# Patient Record
Sex: Female | Born: 1945 | Race: White | Hispanic: No | Smoking: Never smoker
Health system: Southern US, Community
[De-identification: ages and names within clinical notes are randomized; demographics above are authoritative.]

## PROBLEM LIST (undated history)

## (undated) DIAGNOSIS — M1990 Unspecified osteoarthritis, unspecified site: Secondary | ICD-10-CM

## (undated) DIAGNOSIS — T4145XA Adverse effect of unspecified anesthetic, initial encounter: Secondary | ICD-10-CM

## (undated) DIAGNOSIS — M329 Systemic lupus erythematosus, unspecified: Secondary | ICD-10-CM

## (undated) DIAGNOSIS — H269 Unspecified cataract: Secondary | ICD-10-CM

## (undated) DIAGNOSIS — F329 Major depressive disorder, single episode, unspecified: Secondary | ICD-10-CM

## (undated) DIAGNOSIS — I2510 Atherosclerotic heart disease of native coronary artery without angina pectoris: Secondary | ICD-10-CM

## (undated) DIAGNOSIS — Z9889 Other specified postprocedural states: Secondary | ICD-10-CM

## (undated) DIAGNOSIS — E538 Deficiency of other specified B group vitamins: Secondary | ICD-10-CM

## (undated) DIAGNOSIS — F32A Depression, unspecified: Secondary | ICD-10-CM

## (undated) DIAGNOSIS — T8859XA Other complications of anesthesia, initial encounter: Secondary | ICD-10-CM

## (undated) DIAGNOSIS — N3010 Interstitial cystitis (chronic) without hematuria: Secondary | ICD-10-CM

## (undated) DIAGNOSIS — T7840XA Allergy, unspecified, initial encounter: Secondary | ICD-10-CM

## (undated) DIAGNOSIS — G9050 Complex regional pain syndrome I, unspecified: Secondary | ICD-10-CM

## (undated) DIAGNOSIS — IMO0001 Reserved for inherently not codable concepts without codable children: Secondary | ICD-10-CM

## (undated) DIAGNOSIS — M797 Fibromyalgia: Secondary | ICD-10-CM

## (undated) DIAGNOSIS — Z8679 Personal history of other diseases of the circulatory system: Secondary | ICD-10-CM

## (undated) DIAGNOSIS — Z5189 Encounter for other specified aftercare: Secondary | ICD-10-CM

## (undated) DIAGNOSIS — F419 Anxiety disorder, unspecified: Secondary | ICD-10-CM

## (undated) DIAGNOSIS — Z86010 Personal history of colonic polyps: Secondary | ICD-10-CM

## (undated) DIAGNOSIS — M810 Age-related osteoporosis without current pathological fracture: Secondary | ICD-10-CM

## (undated) DIAGNOSIS — D649 Anemia, unspecified: Secondary | ICD-10-CM

## (undated) DIAGNOSIS — I509 Heart failure, unspecified: Secondary | ICD-10-CM

## (undated) DIAGNOSIS — I5181 Takotsubo syndrome: Secondary | ICD-10-CM

## (undated) DIAGNOSIS — G20 Parkinson's disease: Secondary | ICD-10-CM

## (undated) DIAGNOSIS — R519 Headache, unspecified: Secondary | ICD-10-CM

## (undated) DIAGNOSIS — H409 Unspecified glaucoma: Secondary | ICD-10-CM

## (undated) DIAGNOSIS — I951 Orthostatic hypotension: Secondary | ICD-10-CM

## (undated) DIAGNOSIS — M351 Other overlap syndromes: Secondary | ICD-10-CM

## (undated) DIAGNOSIS — R112 Nausea with vomiting, unspecified: Secondary | ICD-10-CM

## (undated) DIAGNOSIS — E785 Hyperlipidemia, unspecified: Secondary | ICD-10-CM

## (undated) DIAGNOSIS — K219 Gastro-esophageal reflux disease without esophagitis: Secondary | ICD-10-CM

## (undated) DIAGNOSIS — I429 Cardiomyopathy, unspecified: Secondary | ICD-10-CM

## (undated) DIAGNOSIS — J189 Pneumonia, unspecified organism: Secondary | ICD-10-CM

## (undated) HISTORY — DX: Complex regional pain syndrome I, unspecified: G90.50

## (undated) HISTORY — DX: Unspecified cataract: H26.9

## (undated) HISTORY — DX: Systemic lupus erythematosus, unspecified: M32.9

## (undated) HISTORY — DX: Hyperlipidemia, unspecified: E78.5

## (undated) HISTORY — DX: Parkinson's disease: G20

## (undated) HISTORY — DX: Unspecified osteoarthritis, unspecified site: M19.90

## (undated) HISTORY — DX: Heart failure, unspecified: I50.9

## (undated) HISTORY — DX: Age-related osteoporosis without current pathological fracture: M81.0

## (undated) HISTORY — DX: Personal history of other diseases of the circulatory system: Z86.79

## (undated) HISTORY — DX: Cardiomyopathy, unspecified: I42.9

## (undated) HISTORY — DX: Deficiency of other specified B group vitamins: E53.8

## (undated) HISTORY — DX: Takotsubo syndrome: I51.81

## (undated) HISTORY — DX: Anxiety disorder, unspecified: F41.9

## (undated) HISTORY — DX: Encounter for other specified aftercare: Z51.89

## (undated) HISTORY — PX: EYE SURGERY: SHX253

## (undated) HISTORY — PX: ABDOMINAL HYSTERECTOMY: SHX81

## (undated) HISTORY — DX: Atherosclerotic heart disease of native coronary artery without angina pectoris: I25.10

## (undated) HISTORY — DX: Other overlap syndromes: M35.1

## (undated) HISTORY — DX: Major depressive disorder, single episode, unspecified: F32.9

## (undated) HISTORY — PX: CHOLECYSTECTOMY: SHX55

## (undated) HISTORY — DX: Depression, unspecified: F32.A

## (undated) HISTORY — PX: UPPER GASTROINTESTINAL ENDOSCOPY: SHX188

## (undated) HISTORY — DX: Reserved for inherently not codable concepts without codable children: IMO0001

## (undated) HISTORY — PX: CATARACT EXTRACTION: SUR2

## (undated) HISTORY — DX: Orthostatic hypotension: I95.1

## (undated) HISTORY — DX: Personal history of colonic polyps: Z86.010

## (undated) HISTORY — DX: Unspecified glaucoma: H40.9

## (undated) HISTORY — DX: Gastro-esophageal reflux disease without esophagitis: K21.9

## (undated) HISTORY — DX: Interstitial cystitis (chronic) without hematuria: N30.10

## (undated) HISTORY — DX: Allergy, unspecified, initial encounter: T78.40XA

## (undated) HISTORY — PX: TUBAL LIGATION: SHX77

## (undated) HISTORY — DX: Anemia, unspecified: D64.9

---
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
## 2001-06-12 ENCOUNTER — Inpatient Hospital Stay (HOSPITAL_COMMUNITY): Admission: EM | Admit: 2001-06-12 | Discharge: 2001-06-16 | Payer: Self-pay | Admitting: Emergency Medicine

## 2001-06-12 ENCOUNTER — Encounter: Payer: Self-pay | Admitting: Emergency Medicine

## 2001-06-13 ENCOUNTER — Encounter: Payer: Self-pay | Admitting: Internal Medicine

## 2001-09-02 ENCOUNTER — Ambulatory Visit (HOSPITAL_COMMUNITY): Admission: RE | Admit: 2001-09-02 | Discharge: 2001-09-02 | Payer: Self-pay | Admitting: Internal Medicine

## 2002-11-27 DIAGNOSIS — Z860100 Personal history of colon polyps, unspecified: Secondary | ICD-10-CM

## 2002-11-27 HISTORY — DX: Personal history of colon polyps, unspecified: Z86.0100

## 2002-11-27 HISTORY — DX: Personal history of colonic polyps: Z86.010

## 2003-03-09 ENCOUNTER — Encounter (INDEPENDENT_AMBULATORY_CARE_PROVIDER_SITE_OTHER): Payer: Self-pay | Admitting: *Deleted

## 2003-03-12 ENCOUNTER — Encounter: Payer: Self-pay | Admitting: Gastroenterology

## 2003-03-12 ENCOUNTER — Ambulatory Visit (HOSPITAL_COMMUNITY): Admission: RE | Admit: 2003-03-12 | Discharge: 2003-03-12 | Payer: Self-pay | Admitting: Gastroenterology

## 2003-04-07 ENCOUNTER — Encounter (INDEPENDENT_AMBULATORY_CARE_PROVIDER_SITE_OTHER): Payer: Self-pay | Admitting: *Deleted

## 2004-07-20 ENCOUNTER — Encounter: Admission: RE | Admit: 2004-07-20 | Discharge: 2004-07-20 | Payer: Self-pay | Admitting: Internal Medicine

## 2004-09-23 ENCOUNTER — Ambulatory Visit: Payer: Self-pay | Admitting: Internal Medicine

## 2004-10-19 ENCOUNTER — Ambulatory Visit: Payer: Self-pay | Admitting: Internal Medicine

## 2004-11-14 ENCOUNTER — Ambulatory Visit: Payer: Self-pay | Admitting: Internal Medicine

## 2004-12-26 ENCOUNTER — Ambulatory Visit: Payer: Self-pay | Admitting: Internal Medicine

## 2004-12-30 ENCOUNTER — Encounter (INDEPENDENT_AMBULATORY_CARE_PROVIDER_SITE_OTHER): Payer: Self-pay | Admitting: Specialist

## 2004-12-30 ENCOUNTER — Ambulatory Visit (HOSPITAL_COMMUNITY): Admission: RE | Admit: 2004-12-30 | Discharge: 2004-12-30 | Payer: Self-pay | Admitting: Urology

## 2004-12-30 ENCOUNTER — Ambulatory Visit (HOSPITAL_BASED_OUTPATIENT_CLINIC_OR_DEPARTMENT_OTHER): Admission: RE | Admit: 2004-12-30 | Discharge: 2004-12-30 | Payer: Self-pay | Admitting: Urology

## 2005-01-17 ENCOUNTER — Ambulatory Visit: Payer: Self-pay | Admitting: Internal Medicine

## 2005-02-01 ENCOUNTER — Ambulatory Visit: Payer: Self-pay | Admitting: Internal Medicine

## 2005-02-20 ENCOUNTER — Ambulatory Visit: Payer: Self-pay | Admitting: Internal Medicine

## 2005-03-20 ENCOUNTER — Ambulatory Visit: Payer: Self-pay | Admitting: Internal Medicine

## 2005-03-24 ENCOUNTER — Ambulatory Visit: Payer: Self-pay | Admitting: Internal Medicine

## 2005-04-04 ENCOUNTER — Ambulatory Visit: Payer: Self-pay | Admitting: Internal Medicine

## 2005-04-18 ENCOUNTER — Ambulatory Visit: Payer: Self-pay | Admitting: Internal Medicine

## 2005-05-23 ENCOUNTER — Ambulatory Visit: Payer: Self-pay | Admitting: Internal Medicine

## 2005-06-20 ENCOUNTER — Ambulatory Visit: Payer: Self-pay | Admitting: Internal Medicine

## 2005-07-24 ENCOUNTER — Ambulatory Visit: Payer: Self-pay | Admitting: Internal Medicine

## 2005-07-25 ENCOUNTER — Ambulatory Visit: Payer: Self-pay | Admitting: Internal Medicine

## 2005-07-28 ENCOUNTER — Ambulatory Visit: Payer: Self-pay | Admitting: Internal Medicine

## 2005-08-22 ENCOUNTER — Ambulatory Visit: Payer: Self-pay | Admitting: Internal Medicine

## 2005-09-21 ENCOUNTER — Encounter (INDEPENDENT_AMBULATORY_CARE_PROVIDER_SITE_OTHER): Payer: Self-pay | Admitting: *Deleted

## 2005-09-21 ENCOUNTER — Ambulatory Visit: Payer: Self-pay | Admitting: Cardiology

## 2005-09-22 ENCOUNTER — Encounter: Payer: Self-pay | Admitting: Internal Medicine

## 2005-09-22 ENCOUNTER — Ambulatory Visit: Payer: Self-pay | Admitting: Endocrinology

## 2005-09-22 ENCOUNTER — Inpatient Hospital Stay (HOSPITAL_COMMUNITY): Admission: RE | Admit: 2005-09-22 | Discharge: 2005-09-28 | Payer: Self-pay | Admitting: General Surgery

## 2005-10-02 ENCOUNTER — Ambulatory Visit: Payer: Self-pay | Admitting: Internal Medicine

## 2005-10-17 ENCOUNTER — Ambulatory Visit: Payer: Self-pay | Admitting: Internal Medicine

## 2005-11-08 ENCOUNTER — Ambulatory Visit: Payer: Self-pay | Admitting: Internal Medicine

## 2005-12-20 ENCOUNTER — Ambulatory Visit: Payer: Self-pay | Admitting: Internal Medicine

## 2006-01-19 ENCOUNTER — Ambulatory Visit (HOSPITAL_BASED_OUTPATIENT_CLINIC_OR_DEPARTMENT_OTHER): Admission: RE | Admit: 2006-01-19 | Discharge: 2006-01-19 | Payer: Self-pay | Admitting: Urology

## 2006-02-02 ENCOUNTER — Ambulatory Visit: Payer: Self-pay | Admitting: Internal Medicine

## 2006-04-03 ENCOUNTER — Ambulatory Visit: Payer: Self-pay | Admitting: Internal Medicine

## 2006-05-16 ENCOUNTER — Ambulatory Visit: Payer: Self-pay | Admitting: Internal Medicine

## 2006-05-29 ENCOUNTER — Ambulatory Visit: Payer: Self-pay | Admitting: Internal Medicine

## 2006-06-12 ENCOUNTER — Ambulatory Visit: Payer: Self-pay | Admitting: Internal Medicine

## 2006-07-24 ENCOUNTER — Ambulatory Visit: Payer: Self-pay | Admitting: Internal Medicine

## 2006-08-22 ENCOUNTER — Ambulatory Visit: Payer: Self-pay | Admitting: Internal Medicine

## 2006-09-05 ENCOUNTER — Ambulatory Visit: Payer: Self-pay | Admitting: Internal Medicine

## 2006-09-05 LAB — CONVERTED CEMR LAB
ALT: 14 units/L (ref 0–40)
AST: 20 units/L (ref 0–37)
Albumin: 3.6 g/dL (ref 3.5–5.2)
Alkaline Phosphatase: 72 units/L (ref 39–117)
BUN: 9 mg/dL (ref 6–23)
Basophils Absolute: 0 10*3/uL (ref 0.0–0.1)
Basophils Relative: 0.7 % (ref 0.0–1.0)
Bilirubin, Direct: 0.1 mg/dL (ref 0.0–0.3)
CO2: 31 meq/L (ref 19–32)
Calcium: 9.4 mg/dL (ref 8.4–10.5)
Chloride: 106 meq/L (ref 96–112)
Creatinine, Ser: 0.8 mg/dL (ref 0.4–1.2)
Eosinophil percent: 2.7 % (ref 0.0–5.0)
GFR calc non Af Amer: 78 mL/min
Glomerular Filtration Rate, Af Am: 95 mL/min/{1.73_m2}
Glucose, Bld: 93 mg/dL (ref 70–99)
HCT: 32.4 % — ABNORMAL LOW (ref 36.0–46.0)
Hemoglobin: 10.4 g/dL — ABNORMAL LOW (ref 12.0–15.0)
Lymphocytes Relative: 41.3 % (ref 12.0–46.0)
MCHC: 32.1 g/dL (ref 30.0–36.0)
MCV: 84.7 fL (ref 78.0–100.0)
Monocytes Absolute: 0.3 10*3/uL (ref 0.2–0.7)
Monocytes Relative: 7.6 % (ref 3.0–11.0)
Neutro Abs: 1.7 10*3/uL (ref 1.4–7.7)
Neutrophils Relative %: 47.7 % (ref 43.0–77.0)
Platelets: 237 10*3/uL (ref 150–400)
Potassium: 4.9 meq/L (ref 3.5–5.1)
RBC: 3.82 M/uL — ABNORMAL LOW (ref 3.87–5.11)
RDW: 12.8 % (ref 11.5–14.6)
Sodium: 141 meq/L (ref 135–145)
Total Bilirubin: 0.6 mg/dL (ref 0.3–1.2)
Total Protein: 5.9 g/dL — ABNORMAL LOW (ref 6.0–8.3)
WBC: 3.5 10*3/uL — ABNORMAL LOW (ref 4.5–10.5)

## 2006-10-05 ENCOUNTER — Ambulatory Visit: Payer: Self-pay | Admitting: Internal Medicine

## 2006-11-14 ENCOUNTER — Ambulatory Visit: Payer: Self-pay | Admitting: Internal Medicine

## 2006-11-14 LAB — CONVERTED CEMR LAB
ALT: 15 units/L (ref 0–40)
AST: 17 units/L (ref 0–37)
Albumin: 3.6 g/dL (ref 3.5–5.2)
Alkaline Phosphatase: 61 units/L (ref 39–117)
Basophils Absolute: 0.1 10*3/uL (ref 0.0–0.1)
Basophils Relative: 1.8 % — ABNORMAL HIGH (ref 0.0–1.0)
Bilirubin, Direct: 0.1 mg/dL (ref 0.0–0.3)
Eosinophil percent: 3.3 % (ref 0.0–5.0)
Folate: 12.5 ng/mL
HCT: 31.8 % — ABNORMAL LOW (ref 36.0–46.0)
Hemoglobin: 10.2 g/dL — ABNORMAL LOW (ref 12.0–15.0)
Iron: 85 ug/dL (ref 42–145)
Lymphocytes Relative: 51.3 % — ABNORMAL HIGH (ref 12.0–46.0)
MCHC: 32 g/dL (ref 30.0–36.0)
MCV: 85.3 fL (ref 78.0–100.0)
Monocytes Absolute: 0.3 10*3/uL (ref 0.2–0.7)
Monocytes Relative: 7.2 % (ref 3.0–11.0)
Neutro Abs: 1.7 10*3/uL (ref 1.4–7.7)
Neutrophils Relative %: 36.4 % — ABNORMAL LOW (ref 43.0–77.0)
Platelets: 225 10*3/uL (ref 150–400)
RBC: 3.72 M/uL — ABNORMAL LOW (ref 3.87–5.11)
RDW: 13.5 % (ref 11.5–14.6)
Saturation Ratios: 25.4 % (ref 20.0–50.0)
Total Bilirubin: 0.4 mg/dL (ref 0.3–1.2)
Total Protein: 6 g/dL (ref 6.0–8.3)
Transferrin: 238.7 mg/dL (ref >=212.0)
Vitamin B-12: >1500 pg/mL — ABNORMAL HIGH (ref 211–911)
WBC: 4.6 10*3/uL (ref 4.5–10.5)

## 2006-11-27 DIAGNOSIS — I5181 Takotsubo syndrome: Secondary | ICD-10-CM | POA: Insufficient documentation

## 2006-11-27 HISTORY — DX: Takotsubo syndrome: I51.81

## 2006-12-12 ENCOUNTER — Ambulatory Visit: Payer: Self-pay | Admitting: Internal Medicine

## 2006-12-12 LAB — CONVERTED CEMR LAB
Basophils Absolute: 0 10*3/uL (ref 0.0–0.1)
Basophils Relative: 0.7 % (ref 0.0–1.0)
Eosinophils Relative: 3.8 % (ref 0.0–5.0)
HCT: 36.3 % (ref 36.0–46.0)
Hemoglobin: 11.7 g/dL — ABNORMAL LOW (ref 12.0–15.0)
Lymphocytes Relative: 42.8 % (ref 12.0–46.0)
MCHC: 32.3 g/dL (ref 30.0–36.0)
MCV: 84.1 fL (ref 78.0–100.0)
Monocytes Absolute: 0.3 10*3/uL (ref 0.2–0.7)
Monocytes Relative: 9.2 % (ref 3.0–11.0)
Neutro Abs: 1.8 10*3/uL (ref 1.4–7.7)
Neutrophils Relative %: 43.5 % (ref 43.0–77.0)
Platelets: 227 10*3/uL (ref 150–400)
RBC: 4.32 M/uL (ref 3.87–5.11)
RDW: 12.7 % (ref 11.5–14.6)
WBC: 3.8 10*3/uL — ABNORMAL LOW (ref 4.5–10.5)

## 2006-12-27 ENCOUNTER — Ambulatory Visit: Payer: Self-pay | Admitting: Cardiovascular Disease

## 2006-12-27 ENCOUNTER — Ambulatory Visit: Payer: Self-pay | Admitting: Pulmonary Disease

## 2006-12-27 ENCOUNTER — Inpatient Hospital Stay (HOSPITAL_COMMUNITY): Admission: EM | Admit: 2006-12-27 | Discharge: 2007-01-10 | Payer: Self-pay | Admitting: Emergency Medicine

## 2006-12-27 ENCOUNTER — Ambulatory Visit: Payer: Self-pay | Admitting: Internal Medicine

## 2006-12-28 ENCOUNTER — Ambulatory Visit: Payer: Self-pay | Admitting: Internal Medicine

## 2006-12-30 ENCOUNTER — Encounter: Payer: Self-pay | Admitting: Cardiovascular Disease

## 2007-01-04 ENCOUNTER — Encounter: Payer: Self-pay | Admitting: Internal Medicine

## 2007-01-10 ENCOUNTER — Inpatient Hospital Stay (HOSPITAL_COMMUNITY)
Admission: RE | Admit: 2007-01-10 | Discharge: 2007-01-17 | Payer: Self-pay | Admitting: Physical Medicine & Rehabilitation

## 2007-01-10 ENCOUNTER — Ambulatory Visit: Payer: Self-pay | Admitting: Physical Medicine & Rehabilitation

## 2007-01-10 ENCOUNTER — Ambulatory Visit: Payer: Self-pay | Admitting: Emergency Medicine

## 2007-01-17 ENCOUNTER — Encounter (INDEPENDENT_AMBULATORY_CARE_PROVIDER_SITE_OTHER): Payer: Self-pay | Admitting: *Deleted

## 2007-01-23 ENCOUNTER — Ambulatory Visit: Payer: Self-pay | Admitting: Internal Medicine

## 2007-01-23 LAB — CONVERTED CEMR LAB
ALT: 20 units/L (ref 0–40)
AST: 17 units/L (ref 0–37)
Albumin: 4.1 g/dL (ref 3.5–5.2)
Alkaline Phosphatase: 60 units/L (ref 39–117)
BUN: 13 mg/dL (ref 6–23)
Basophils Absolute: 0 10*3/uL (ref 0.0–0.1)
Basophils Relative: 0.3 % (ref 0.0–1.0)
Bilirubin, Direct: 0.1 mg/dL (ref 0.0–0.3)
CO2: 33 meq/L — ABNORMAL HIGH (ref 19–32)
Calcium: 10.4 mg/dL (ref 8.4–10.5)
Chloride: 101 meq/L (ref 96–112)
Creatinine, Ser: 0.6 mg/dL (ref 0.4–1.2)
Eosinophils Absolute: 0.3 10*3/uL (ref 0.0–0.6)
Eosinophils Relative: 5.4 % — ABNORMAL HIGH (ref 0.0–5.0)
GFR calc Af Amer: 132 mL/min
GFR calc non Af Amer: 109 mL/min
Glucose, Bld: 105 mg/dL — ABNORMAL HIGH (ref 70–99)
HCT: 38 % (ref 36.0–46.0)
Hemoglobin: 12.7 g/dL (ref 12.0–15.0)
Lymphocytes Relative: 33.9 % (ref 12.0–46.0)
MCHC: 33.4 g/dL (ref 30.0–36.0)
MCV: 84.6 fL (ref 78.0–100.0)
Magnesium: 1.8 mg/dL (ref 1.5–2.5)
Monocytes Absolute: 0.6 10*3/uL (ref 0.2–0.7)
Monocytes Relative: 9.4 % (ref 3.0–11.0)
Neutro Abs: 3.1 10*3/uL (ref 1.4–7.7)
Neutrophils Relative %: 51 % (ref 43.0–77.0)
Platelets: 242 10*3/uL (ref 150–400)
Potassium: 4.9 meq/L (ref 3.5–5.1)
RBC: 4.49 M/uL (ref 3.87–5.11)
RDW: 13.7 % (ref 11.5–14.6)
Sodium: 141 meq/L (ref 135–145)
Total Bilirubin: 0.7 mg/dL (ref 0.3–1.2)
Total Protein: 7.1 g/dL (ref 6.0–8.3)
WBC: 6 10*3/uL (ref 4.5–10.5)

## 2007-01-31 ENCOUNTER — Ambulatory Visit: Payer: Self-pay | Admitting: Internal Medicine

## 2007-01-31 LAB — CONVERTED CEMR LAB
ALT: 29 units/L (ref 0–40)
AST: 20 units/L (ref 0–37)
Albumin: 3.8 g/dL (ref 3.5–5.2)
Alkaline Phosphatase: 64 units/L (ref 39–117)
BUN: 8 mg/dL (ref 6–23)
Basophils Absolute: 0 10*3/uL (ref 0.0–0.1)
Basophils Relative: 0.4 % (ref 0.0–1.0)
Bilirubin, Direct: 0.1 mg/dL (ref 0.0–0.3)
CO2: 32 meq/L (ref 19–32)
Calcium: 10 mg/dL (ref 8.4–10.5)
Chloride: 98 meq/L (ref 96–112)
Creatinine, Ser: 0.6 mg/dL (ref 0.4–1.2)
Digitoxin Lvl: 1.3 ng/mL (ref 0.8–2.0)
Eosinophils Absolute: 0.2 10*3/uL (ref 0.0–0.6)
Eosinophils Relative: 3.8 % (ref 0.0–5.0)
GFR calc Af Amer: 132 mL/min
GFR calc non Af Amer: 109 mL/min
Glucose, Bld: 102 mg/dL — ABNORMAL HIGH (ref 70–99)
HCT: 35.8 % — ABNORMAL LOW (ref 36.0–46.0)
Hemoglobin: 11.8 g/dL — ABNORMAL LOW (ref 12.0–15.0)
Lymphocytes Relative: 43.3 % (ref 12.0–46.0)
MCHC: 32.8 g/dL (ref 30.0–36.0)
MCV: 83.3 fL (ref 78.0–100.0)
Monocytes Absolute: 0.4 10*3/uL (ref 0.2–0.7)
Monocytes Relative: 7.5 % (ref 3.0–11.0)
Neutro Abs: 2.4 10*3/uL (ref 1.4–7.7)
Neutrophils Relative %: 45 % (ref 43.0–77.0)
Platelets: 262 10*3/uL (ref 150–400)
Potassium: 4.1 meq/L (ref 3.5–5.1)
RBC: 4.3 M/uL (ref 3.87–5.11)
RDW: 13.7 % (ref 11.5–14.6)
Sodium: 139 meq/L (ref 135–145)
TSH: 0.51 microintl units/mL (ref 0.35–5.50)
Total Bilirubin: 0.6 mg/dL (ref 0.3–1.2)
Total Protein: 7.2 g/dL (ref 6.0–8.3)
WBC: 5.3 10*3/uL (ref 4.5–10.5)

## 2007-02-01 ENCOUNTER — Encounter: Payer: Self-pay | Admitting: Internal Medicine

## 2007-02-11 ENCOUNTER — Ambulatory Visit: Payer: Self-pay | Admitting: Internal Medicine

## 2007-02-11 LAB — CONVERTED CEMR LAB
ALT: 27 units/L (ref 0–40)
AST: 25 units/L (ref 0–37)
Albumin: 4 g/dL (ref 3.5–5.2)
Alkaline Phosphatase: 70 units/L (ref 39–117)
BUN: 8 mg/dL (ref 6–23)
Basophils Absolute: 0 10*3/uL (ref 0.0–0.1)
Basophils Relative: 0.5 % (ref 0.0–1.0)
Bilirubin, Direct: 0.1 mg/dL (ref 0.0–0.3)
CO2: 33 meq/L — ABNORMAL HIGH (ref 19–32)
Calcium: 10.1 mg/dL (ref 8.4–10.5)
Chloride: 99 meq/L (ref 96–112)
Creatinine, Ser: 0.8 mg/dL (ref 0.4–1.2)
Eosinophils Absolute: 0.1 10*3/uL (ref 0.0–0.6)
Eosinophils Relative: 2.5 % (ref 0.0–5.0)
GFR calc Af Amer: 95 mL/min
GFR calc non Af Amer: 78 mL/min
Glucose, Bld: 110 mg/dL — ABNORMAL HIGH (ref 70–99)
HCT: 38 % (ref 36.0–46.0)
Hemoglobin: 12.9 g/dL (ref 12.0–15.0)
Hgb A1c MFr Bld: 5.8 % (ref 4.6–6.0)
Lymphocytes Relative: 35 % (ref 12.0–46.0)
MCHC: 33.9 g/dL (ref 30.0–36.0)
MCV: 82.1 fL (ref 78.0–100.0)
Monocytes Absolute: 0.5 10*3/uL (ref 0.2–0.7)
Monocytes Relative: 9.1 % (ref 3.0–11.0)
Neutro Abs: 2.9 10*3/uL (ref 1.4–7.7)
Neutrophils Relative %: 52.9 % (ref 43.0–77.0)
Platelets: 229 10*3/uL (ref 150–400)
Potassium: 3.9 meq/L (ref 3.5–5.1)
Pro B Natriuretic peptide (BNP): 13 pg/mL (ref 0.0–100.0)
RBC: 4.63 M/uL (ref 3.87–5.11)
RDW: 13.2 % (ref 11.5–14.6)
Sodium: 139 meq/L (ref 135–145)
Total Bilirubin: 0.6 mg/dL (ref 0.3–1.2)
Total Protein: 6.7 g/dL (ref 6.0–8.3)
WBC: 5.4 10*3/uL (ref 4.5–10.5)

## 2007-02-12 ENCOUNTER — Encounter: Payer: Self-pay | Admitting: Internal Medicine

## 2007-02-13 ENCOUNTER — Ambulatory Visit: Payer: Self-pay | Admitting: Cardiovascular Disease

## 2007-02-28 ENCOUNTER — Ambulatory Visit: Payer: Self-pay | Admitting: Internal Medicine

## 2007-02-28 ENCOUNTER — Ambulatory Visit (HOSPITAL_COMMUNITY): Admission: RE | Admit: 2007-02-28 | Discharge: 2007-02-28 | Payer: Self-pay | Admitting: Cardiovascular Disease

## 2007-02-28 ENCOUNTER — Ambulatory Visit: Payer: Self-pay | Admitting: Cardiovascular Disease

## 2007-03-14 ENCOUNTER — Ambulatory Visit: Payer: Self-pay | Admitting: Cardiovascular Disease

## 2007-03-28 ENCOUNTER — Ambulatory Visit: Payer: Self-pay | Admitting: Internal Medicine

## 2007-03-29 ENCOUNTER — Encounter: Payer: Self-pay | Admitting: Internal Medicine

## 2007-04-19 ENCOUNTER — Ambulatory Visit: Payer: Self-pay | Admitting: Internal Medicine

## 2007-04-26 ENCOUNTER — Ambulatory Visit: Payer: Self-pay | Admitting: Internal Medicine

## 2007-05-03 DIAGNOSIS — E538 Deficiency of other specified B group vitamins: Secondary | ICD-10-CM

## 2007-05-03 DIAGNOSIS — M797 Fibromyalgia: Secondary | ICD-10-CM

## 2007-05-03 DIAGNOSIS — IMO0001 Reserved for inherently not codable concepts without codable children: Secondary | ICD-10-CM

## 2007-05-03 HISTORY — DX: Deficiency of other specified B group vitamins: E53.8

## 2007-05-03 HISTORY — DX: Reserved for inherently not codable concepts without codable children: IMO0001

## 2007-05-10 ENCOUNTER — Ambulatory Visit: Payer: Self-pay | Admitting: Internal Medicine

## 2007-05-28 ENCOUNTER — Ambulatory Visit: Payer: Self-pay | Admitting: Internal Medicine

## 2007-06-11 ENCOUNTER — Encounter: Payer: Self-pay | Admitting: Internal Medicine

## 2007-06-11 ENCOUNTER — Ambulatory Visit: Payer: Self-pay | Admitting: Internal Medicine

## 2007-06-21 ENCOUNTER — Telehealth (INDEPENDENT_AMBULATORY_CARE_PROVIDER_SITE_OTHER): Payer: Self-pay | Admitting: *Deleted

## 2007-06-23 ENCOUNTER — Encounter: Payer: Self-pay | Admitting: Internal Medicine

## 2007-06-24 ENCOUNTER — Telehealth (INDEPENDENT_AMBULATORY_CARE_PROVIDER_SITE_OTHER): Payer: Self-pay | Admitting: *Deleted

## 2007-06-26 ENCOUNTER — Encounter: Payer: Self-pay | Admitting: Internal Medicine

## 2007-07-09 ENCOUNTER — Ambulatory Visit: Payer: Self-pay | Admitting: Internal Medicine

## 2007-07-09 DIAGNOSIS — N390 Urinary tract infection, site not specified: Secondary | ICD-10-CM

## 2007-07-09 LAB — CONVERTED CEMR LAB
ALT: 14 units/L (ref 0–35)
AST: 21 units/L (ref 0–37)
Albumin: 4.3 g/dL (ref 3.5–5.2)
Alkaline Phosphatase: 68 units/L (ref 39–117)
BUN: 14 mg/dL (ref 6–23)
Basophils Absolute: 0 10*3/uL (ref 0.0–0.1)
Basophils Relative: 0 % (ref 0.0–1.0)
Bilirubin Urine: NEGATIVE
Bilirubin, Direct: 0.2 mg/dL (ref 0.0–0.3)
CO2: 30 meq/L (ref 19–32)
Calcium: 10 mg/dL (ref 8.4–10.5)
Chloride: 107 meq/L (ref 96–112)
Creatinine, Ser: 0.8 mg/dL (ref 0.4–1.2)
Eosinophils Absolute: 0 10*3/uL (ref 0.0–0.6)
Eosinophils Relative: 0.7 % (ref 0.0–5.0)
GFR calc Af Amer: 94 mL/min
GFR calc non Af Amer: 78 mL/min
Glucose, Bld: 96 mg/dL (ref 70–99)
Glucose, Urine, Semiquant: NEGATIVE
HCT: 37.4 % (ref 36.0–46.0)
Hemoglobin: 12.5 g/dL (ref 12.0–15.0)
Lymphocytes Relative: 37.6 % (ref 12.0–46.0)
MCHC: 33.5 g/dL (ref 30.0–36.0)
MCV: 81.8 fL (ref 78.0–100.0)
Monocytes Absolute: 0.3 10*3/uL (ref 0.2–0.7)
Monocytes Relative: 6.4 % (ref 3.0–11.0)
Neutro Abs: 2.9 10*3/uL (ref 1.4–7.7)
Neutrophils Relative %: 55.3 % (ref 43.0–77.0)
Nitrite: NEGATIVE
Platelets: 244 10*3/uL (ref 150–400)
Potassium: 5.2 meq/L — ABNORMAL HIGH (ref 3.5–5.1)
Protein, U semiquant: NEGATIVE
RBC: 4.57 M/uL (ref 3.87–5.11)
RDW: 15.4 % — ABNORMAL HIGH (ref 11.5–14.6)
Sodium: 143 meq/L (ref 135–145)
Specific Gravity, Urine: <1.005
Total Bilirubin: 1 mg/dL (ref 0.3–1.2)
Total Protein: 6.9 g/dL (ref 6.0–8.3)
Urobilinogen, UA: 0.2
WBC: 5.1 10*3/uL (ref 4.5–10.5)
pH: 6

## 2007-08-05 ENCOUNTER — Ambulatory Visit: Payer: Self-pay | Admitting: Family Medicine

## 2007-08-05 ENCOUNTER — Encounter: Payer: Self-pay | Admitting: Internal Medicine

## 2007-08-21 ENCOUNTER — Ambulatory Visit: Payer: Self-pay | Admitting: Internal Medicine

## 2007-08-22 LAB — CONVERTED CEMR LAB
ALT: 10 units/L (ref 0–35)
AST: 16 units/L (ref 0–37)
Albumin: 3.8 g/dL (ref 3.5–5.2)
Alkaline Phosphatase: 70 units/L (ref 39–117)
BUN: 14 mg/dL (ref 6–23)
Basophils Absolute: 0 10*3/uL (ref 0.0–0.1)
Basophils Relative: 0.4 % (ref 0.0–1.0)
Bilirubin, Direct: 0.1 mg/dL (ref 0.0–0.3)
CO2: 33 meq/L — ABNORMAL HIGH (ref 19–32)
Calcium: 9.7 mg/dL (ref 8.4–10.5)
Chloride: 103 meq/L (ref 96–112)
Creatinine, Ser: 0.7 mg/dL (ref 0.4–1.2)
Eosinophils Absolute: 0.1 10*3/uL (ref 0.0–0.6)
Eosinophils Relative: 3 % (ref 0.0–5.0)
GFR calc Af Amer: 110 mL/min
GFR calc non Af Amer: 91 mL/min
Glucose, Bld: 101 mg/dL — ABNORMAL HIGH (ref 70–99)
HCT: 33.6 % — ABNORMAL LOW (ref 36.0–46.0)
Hemoglobin: 11.2 g/dL — ABNORMAL LOW (ref 12.0–15.0)
Lymphocytes Relative: 41.7 % (ref 12.0–46.0)
MCHC: 33.4 g/dL (ref 30.0–36.0)
MCV: 82.6 fL (ref 78.0–100.0)
Monocytes Absolute: 0.4 10*3/uL (ref 0.2–0.7)
Monocytes Relative: 8.8 % (ref 3.0–11.0)
Neutro Abs: 2.1 10*3/uL (ref 1.4–7.7)
Neutrophils Relative %: 46.1 % (ref 43.0–77.0)
Platelets: 212 10*3/uL (ref 150–400)
Potassium: 4.5 meq/L (ref 3.5–5.1)
RBC: 4.07 M/uL (ref 3.87–5.11)
RDW: 13.2 % (ref 11.5–14.6)
Sodium: 140 meq/L (ref 135–145)
Total Bilirubin: 0.6 mg/dL (ref 0.3–1.2)
Total Protein: 6.5 g/dL (ref 6.0–8.3)
WBC: 4.5 10*3/uL (ref 4.5–10.5)

## 2007-09-02 ENCOUNTER — Ambulatory Visit: Payer: Self-pay | Admitting: Cardiovascular Disease

## 2007-09-25 ENCOUNTER — Ambulatory Visit: Payer: Self-pay | Admitting: Internal Medicine

## 2007-09-25 DIAGNOSIS — D649 Anemia, unspecified: Secondary | ICD-10-CM

## 2007-09-25 HISTORY — DX: Anemia, unspecified: D64.9

## 2007-09-25 LAB — CONVERTED CEMR LAB
Basophils Absolute: 0 10*3/uL (ref 0.0–0.1)
Basophils Relative: 0.6 % (ref 0.0–1.0)
Eosinophils Absolute: 0.1 10*3/uL (ref 0.0–0.6)
Eosinophils Relative: 2.6 % (ref 0.0–5.0)
Glucose, Urine, Semiquant: NEGATIVE
HCT: 32.1 % — ABNORMAL LOW (ref 36.0–46.0)
Hemoglobin: 11 g/dL — ABNORMAL LOW (ref 12.0–15.0)
Lymphocytes Relative: 54.2 % — ABNORMAL HIGH (ref 12.0–46.0)
MCHC: 34.2 g/dL (ref 30.0–36.0)
MCV: 83.6 fL (ref 78.0–100.0)
Monocytes Absolute: 0.3 10*3/uL (ref 0.2–0.7)
Monocytes Relative: 6.4 % (ref 3.0–11.0)
Neutro Abs: 1.8 10*3/uL (ref 1.4–7.7)
Neutrophils Relative %: 36.2 % — ABNORMAL LOW (ref 43.0–77.0)
Nitrite: POSITIVE
Platelets: 176 10*3/uL (ref 150–400)
Protein, U semiquant: NEGATIVE
RBC: 3.84 M/uL — ABNORMAL LOW (ref 3.87–5.11)
RDW: 12.9 % (ref 11.5–14.6)
Specific Gravity, Urine: 1.02
Urobilinogen, UA: 0.2
WBC: 4.9 10*3/uL (ref 4.5–10.5)
pH: 5.5

## 2007-09-26 ENCOUNTER — Encounter: Payer: Self-pay | Admitting: Internal Medicine

## 2007-10-09 ENCOUNTER — Ambulatory Visit: Payer: Self-pay | Admitting: Internal Medicine

## 2007-10-09 ENCOUNTER — Telehealth: Payer: Self-pay | Admitting: Internal Medicine

## 2007-10-09 LAB — CONVERTED CEMR LAB
Bilirubin Urine: NEGATIVE
Blood in Urine, dipstick: NEGATIVE
Glucose, Urine, Semiquant: NEGATIVE
Ketones, urine, test strip: NEGATIVE
Nitrite: NEGATIVE
Protein, U semiquant: NEGATIVE
Specific Gravity, Urine: 1.015
Urobilinogen, UA: 0.2
WBC Urine, dipstick: NEGATIVE
pH: 5

## 2007-10-10 ENCOUNTER — Encounter: Payer: Self-pay | Admitting: Internal Medicine

## 2007-10-29 ENCOUNTER — Ambulatory Visit: Payer: Self-pay | Admitting: Internal Medicine

## 2007-10-29 ENCOUNTER — Ambulatory Visit: Payer: Self-pay

## 2007-10-29 ENCOUNTER — Ambulatory Visit: Payer: Self-pay | Admitting: Cardiovascular Disease

## 2007-10-29 ENCOUNTER — Encounter: Payer: Self-pay | Admitting: Cardiovascular Disease

## 2007-10-29 LAB — CONVERTED CEMR LAB
Bilirubin Urine: NEGATIVE
Glucose, Urine, Semiquant: NEGATIVE
Ketones, urine, test strip: NEGATIVE
Nitrite: NEGATIVE
Protein, U semiquant: NEGATIVE
Specific Gravity, Urine: <1.005
Urobilinogen, UA: 0.2
WBC Urine, dipstick: NEGATIVE
pH: 5

## 2007-10-30 ENCOUNTER — Encounter: Payer: Self-pay | Admitting: Internal Medicine

## 2007-11-01 ENCOUNTER — Encounter: Payer: Self-pay | Admitting: Internal Medicine

## 2007-11-01 ENCOUNTER — Telehealth: Payer: Self-pay | Admitting: Internal Medicine

## 2007-11-29 ENCOUNTER — Telehealth: Payer: Self-pay | Admitting: Internal Medicine

## 2007-12-19 ENCOUNTER — Ambulatory Visit: Payer: Self-pay | Admitting: Internal Medicine

## 2007-12-19 DIAGNOSIS — E785 Hyperlipidemia, unspecified: Secondary | ICD-10-CM | POA: Insufficient documentation

## 2007-12-19 HISTORY — DX: Hyperlipidemia, unspecified: E78.5

## 2007-12-19 LAB — CONVERTED CEMR LAB
ALT: 11 units/L (ref 0–35)
AST: 16 units/L (ref 0–37)
Alkaline Phosphatase: 69 units/L (ref 39–117)
BUN: 13 mg/dL (ref 6–23)
Basophils Relative: 0.1 % (ref 0.0–1.0)
Bilirubin, Direct: 0.1 mg/dL (ref 0.0–0.3)
Calcium: 9.7 mg/dL (ref 8.4–10.5)
Chloride: 101 meq/L (ref 96–112)
Eosinophils Absolute: 0.2 10*3/uL (ref 0.0–0.6)
Eosinophils Relative: 3.4 % (ref 0.0–5.0)
GFR calc non Af Amer: 78 mL/min
Glucose, Bld: 83 mg/dL (ref 70–99)
MCV: 83.1 fL (ref 78.0–100.0)
Platelets: 182 10*3/uL (ref 150–400)
RBC: 4.08 M/uL (ref 3.87–5.11)
Total CHOL/HDL Ratio: 3.6
Triglycerides: 94 mg/dL (ref 0–149)
VLDL: 19 mg/dL (ref 0–40)
WBC: 5.1 10*3/uL (ref 4.5–10.5)

## 2007-12-26 ENCOUNTER — Ambulatory Visit: Payer: Self-pay | Admitting: Internal Medicine

## 2007-12-26 LAB — CONVERTED CEMR LAB
Nitrite: NEGATIVE
Protein, U semiquant: NEGATIVE
Specific Gravity, Urine: 1.01
Urobilinogen, UA: 0.2

## 2007-12-30 ENCOUNTER — Ambulatory Visit: Payer: Self-pay | Admitting: Internal Medicine

## 2007-12-30 LAB — CONVERTED CEMR LAB
Bilirubin Urine: NEGATIVE
Ketones, urine, test strip: NEGATIVE
Nitrite: NEGATIVE
Protein, U semiquant: NEGATIVE
Urobilinogen, UA: 0.2

## 2008-01-09 ENCOUNTER — Ambulatory Visit: Payer: Self-pay | Admitting: Internal Medicine

## 2008-02-05 ENCOUNTER — Ambulatory Visit: Payer: Self-pay | Admitting: Internal Medicine

## 2008-02-05 ENCOUNTER — Inpatient Hospital Stay (HOSPITAL_COMMUNITY): Admission: EM | Admit: 2008-02-05 | Discharge: 2008-02-12 | Payer: Self-pay | Admitting: Emergency Medicine

## 2008-02-05 ENCOUNTER — Ambulatory Visit: Payer: Self-pay | Admitting: Pulmonary Disease

## 2008-02-05 LAB — CONVERTED CEMR LAB
AST: 20 units/L (ref 0–37)
Albumin: 4 g/dL (ref 3.5–5.2)
Alkaline Phosphatase: 86 units/L (ref 39–117)
BUN: 10 mg/dL (ref 6–23)
Basophils Absolute: 0.1 10*3/uL (ref 0.0–0.1)
Basophils Relative: 0.8 % (ref 0.0–1.0)
Bilirubin Urine: NEGATIVE
CO2: 31 meq/L (ref 19–32)
Chloride: 97 meq/L (ref 96–112)
Creatinine, Ser: 0.9 mg/dL (ref 0.4–1.2)
Glucose, Urine, Semiquant: NEGATIVE
HCT: 39 % (ref 36.0–46.0)
Hemoglobin: 12.5 g/dL (ref 12.0–15.0)
INR: 1.1 — ABNORMAL HIGH (ref 0.8–1.0)
MCHC: 32.1 g/dL (ref 30.0–36.0)
Monocytes Absolute: 0.6 10*3/uL (ref 0.2–0.7)
Neutrophils Relative %: 76.1 % (ref 43.0–77.0)
Potassium: 4.1 meq/L (ref 3.5–5.1)
Prothrombin Time: 12.6 s (ref 10.9–13.3)
RBC: 4.62 M/uL (ref 3.87–5.11)
RDW: 12.5 % (ref 11.5–14.6)
Total Bilirubin: 0.9 mg/dL (ref 0.3–1.2)
Total Protein: 7.1 g/dL (ref 6.0–8.3)
Urobilinogen, UA: 1
pH: 5.5

## 2008-02-06 ENCOUNTER — Encounter: Payer: Self-pay | Admitting: Internal Medicine

## 2008-02-17 ENCOUNTER — Inpatient Hospital Stay (HOSPITAL_COMMUNITY): Admission: EM | Admit: 2008-02-17 | Discharge: 2008-02-28 | Payer: Self-pay | Admitting: Emergency Medicine

## 2008-02-17 ENCOUNTER — Telehealth: Payer: Self-pay | Admitting: Internal Medicine

## 2008-02-26 ENCOUNTER — Encounter: Payer: Self-pay | Admitting: Internal Medicine

## 2008-02-28 ENCOUNTER — Encounter (INDEPENDENT_AMBULATORY_CARE_PROVIDER_SITE_OTHER): Payer: Self-pay | Admitting: *Deleted

## 2008-02-28 ENCOUNTER — Encounter: Payer: Self-pay | Admitting: Internal Medicine

## 2008-03-02 ENCOUNTER — Telehealth: Payer: Self-pay | Admitting: Internal Medicine

## 2008-03-09 ENCOUNTER — Encounter: Payer: Self-pay | Admitting: Internal Medicine

## 2008-03-10 ENCOUNTER — Ambulatory Visit: Payer: Self-pay | Admitting: Internal Medicine

## 2008-03-23 ENCOUNTER — Ambulatory Visit: Payer: Self-pay | Admitting: Internal Medicine

## 2008-03-25 ENCOUNTER — Telehealth: Payer: Self-pay | Admitting: Internal Medicine

## 2008-04-08 ENCOUNTER — Ambulatory Visit: Payer: Self-pay | Admitting: Internal Medicine

## 2008-04-08 LAB — CONVERTED CEMR LAB
Bilirubin Urine: NEGATIVE
Glucose, Urine, Semiquant: NEGATIVE
Ketones, urine, test strip: NEGATIVE
Specific Gravity, Urine: 1.01
Urobilinogen, UA: 0.2

## 2008-04-09 ENCOUNTER — Encounter: Payer: Self-pay | Admitting: Internal Medicine

## 2008-04-14 ENCOUNTER — Encounter: Payer: Self-pay | Admitting: Internal Medicine

## 2008-04-27 ENCOUNTER — Ambulatory Visit: Payer: Self-pay | Admitting: Internal Medicine

## 2008-05-05 ENCOUNTER — Encounter: Payer: Self-pay | Admitting: Internal Medicine

## 2008-05-11 ENCOUNTER — Ambulatory Visit: Payer: Self-pay | Admitting: Internal Medicine

## 2008-05-11 LAB — CONVERTED CEMR LAB
Glucose, Urine, Semiquant: NEGATIVE
Protein, U semiquant: NEGATIVE
WBC Urine, dipstick: NEGATIVE
pH: 7

## 2008-05-12 ENCOUNTER — Encounter: Payer: Self-pay | Admitting: Internal Medicine

## 2008-05-12 LAB — CONVERTED CEMR LAB
Alkaline Phosphatase: 64 units/L (ref 39–117)
Bilirubin, Direct: 0.1 mg/dL (ref 0.0–0.3)
Eosinophils Absolute: 0.1 10*3/uL (ref 0.0–0.7)
GFR calc Af Amer: 132 mL/min
GFR calc non Af Amer: 109 mL/min
HCT: 34.2 % — ABNORMAL LOW (ref 36.0–46.0)
MCV: 83.8 fL (ref 78.0–100.0)
Monocytes Absolute: 0.3 10*3/uL (ref 0.1–1.0)
Monocytes Relative: 6.8 % (ref 3.0–12.0)
Neutrophils Relative %: 42.8 % — ABNORMAL LOW (ref 43.0–77.0)
Platelets: 212 10*3/uL (ref 150–400)
Potassium: 4 meq/L (ref 3.5–5.1)
RDW: 13 % (ref 11.5–14.6)
Sodium: 140 meq/L (ref 135–145)
Total Bilirubin: 0.7 mg/dL (ref 0.3–1.2)

## 2008-05-26 ENCOUNTER — Encounter: Payer: Self-pay | Admitting: Internal Medicine

## 2008-05-26 ENCOUNTER — Telehealth: Payer: Self-pay | Admitting: Internal Medicine

## 2008-06-04 ENCOUNTER — Telehealth: Payer: Self-pay | Admitting: Internal Medicine

## 2008-06-08 ENCOUNTER — Ambulatory Visit: Payer: Self-pay | Admitting: Internal Medicine

## 2008-06-08 LAB — CONVERTED CEMR LAB
Bilirubin Urine: NEGATIVE
Ketones, urine, test strip: NEGATIVE
Protein, U semiquant: NEGATIVE
Urobilinogen, UA: 0.2

## 2008-06-09 ENCOUNTER — Encounter: Payer: Self-pay | Admitting: Internal Medicine

## 2008-06-15 ENCOUNTER — Ambulatory Visit: Payer: Self-pay | Admitting: Internal Medicine

## 2008-06-16 ENCOUNTER — Encounter: Admission: RE | Admit: 2008-06-16 | Discharge: 2008-08-25 | Payer: Self-pay | Admitting: Internal Medicine

## 2008-06-16 ENCOUNTER — Encounter: Payer: Self-pay | Admitting: Internal Medicine

## 2008-06-17 ENCOUNTER — Telehealth: Payer: Self-pay | Admitting: Internal Medicine

## 2008-06-27 HISTORY — PX: COLONOSCOPY: SHX174

## 2008-06-30 ENCOUNTER — Ambulatory Visit: Payer: Self-pay | Admitting: Internal Medicine

## 2008-06-30 LAB — HM COLONOSCOPY

## 2008-07-08 ENCOUNTER — Telehealth: Payer: Self-pay | Admitting: Internal Medicine

## 2008-07-14 ENCOUNTER — Ambulatory Visit: Payer: Self-pay | Admitting: Internal Medicine

## 2008-07-16 ENCOUNTER — Encounter: Payer: Self-pay | Admitting: Internal Medicine

## 2008-07-30 ENCOUNTER — Ambulatory Visit: Payer: Self-pay | Admitting: Internal Medicine

## 2008-08-10 ENCOUNTER — Telehealth: Payer: Self-pay | Admitting: Internal Medicine

## 2008-08-11 ENCOUNTER — Ambulatory Visit: Payer: Self-pay | Admitting: Internal Medicine

## 2008-09-08 ENCOUNTER — Ambulatory Visit: Payer: Self-pay | Admitting: Internal Medicine

## 2008-09-08 LAB — CONVERTED CEMR LAB
Bilirubin Urine: NEGATIVE
Eosinophils Relative: 4.1 % (ref 0.0–5.0)
Glucose, Urine, Semiquant: NEGATIVE
HCT: 36.2 % (ref 36.0–46.0)
Monocytes Absolute: 0.5 10*3/uL (ref 0.1–1.0)
Monocytes Relative: 9 % (ref 3.0–12.0)
Platelets: 201 10*3/uL (ref 150–400)
Protein, U semiquant: NEGATIVE
WBC: 5.1 10*3/uL (ref 4.5–10.5)
pH: 6

## 2008-09-09 ENCOUNTER — Encounter: Payer: Self-pay | Admitting: Internal Medicine

## 2008-09-29 ENCOUNTER — Encounter: Admission: RE | Admit: 2008-09-29 | Discharge: 2008-09-29 | Payer: Self-pay | Admitting: Internal Medicine

## 2008-10-07 ENCOUNTER — Ambulatory Visit: Payer: Self-pay | Admitting: Internal Medicine

## 2008-10-08 ENCOUNTER — Encounter: Payer: Self-pay | Admitting: Internal Medicine

## 2008-10-08 ENCOUNTER — Telehealth: Payer: Self-pay | Admitting: Internal Medicine

## 2008-10-09 LAB — CONVERTED CEMR LAB
Basophils Absolute: 0 10*3/uL (ref 0.0–0.1)
Basophils Relative: 0.4 % (ref 0.0–3.0)
Calcium: 9.8 mg/dL (ref 8.4–10.5)
Creatinine, Ser: 0.7 mg/dL (ref 0.4–1.2)
Eosinophils Absolute: 0.1 10*3/uL (ref 0.0–0.7)
GFR calc non Af Amer: 91 mL/min
HCT: 35.1 % — ABNORMAL LOW (ref 36.0–46.0)
Hemoglobin: 12.1 g/dL (ref 12.0–15.0)
MCHC: 34.4 g/dL (ref 30.0–36.0)
MCV: 83.1 fL (ref 78.0–100.0)
Monocytes Absolute: 0.3 10*3/uL (ref 0.1–1.0)
Neutro Abs: 2.5 10*3/uL (ref 1.4–7.7)
Neutrophils Relative %: 56.6 % (ref 43.0–77.0)
RBC: 4.22 M/uL (ref 3.87–5.11)

## 2008-11-06 ENCOUNTER — Ambulatory Visit: Payer: Self-pay | Admitting: Internal Medicine

## 2008-11-06 LAB — CONVERTED CEMR LAB
AST: 20 units/L (ref 0–37)
Albumin: 4 g/dL (ref 3.5–5.2)
Alkaline Phosphatase: 85 units/L (ref 39–117)
BUN: 22 mg/dL (ref 6–23)
Bilirubin, Direct: 0.1 mg/dL (ref 0.0–0.3)
Chloride: 101 meq/L (ref 96–112)
Eosinophils Absolute: 0.2 10*3/uL (ref 0.0–0.7)
Eosinophils Relative: 3.3 % (ref 0.0–5.0)
GFR calc non Af Amer: 68 mL/min
Glucose, Bld: 90 mg/dL (ref 70–99)
Monocytes Relative: 8.3 % (ref 3.0–12.0)
Neutrophils Relative %: 35.9 % — ABNORMAL LOW (ref 43.0–77.0)
Platelets: 216 10*3/uL (ref 150–400)
Potassium: 4.7 meq/L (ref 3.5–5.1)
RDW: 13.2 % (ref 11.5–14.6)
Sodium: 140 meq/L (ref 135–145)
WBC: 5.2 10*3/uL (ref 4.5–10.5)

## 2008-11-07 ENCOUNTER — Encounter: Payer: Self-pay | Admitting: Internal Medicine

## 2008-11-19 ENCOUNTER — Encounter: Payer: Self-pay | Admitting: Internal Medicine

## 2008-12-04 ENCOUNTER — Encounter: Payer: Self-pay | Admitting: Internal Medicine

## 2008-12-06 ENCOUNTER — Encounter: Payer: Self-pay | Admitting: Internal Medicine

## 2008-12-06 ENCOUNTER — Ambulatory Visit: Payer: Self-pay | Admitting: Internal Medicine

## 2008-12-06 ENCOUNTER — Inpatient Hospital Stay (HOSPITAL_COMMUNITY): Admission: EM | Admit: 2008-12-06 | Discharge: 2008-12-08 | Payer: Self-pay | Admitting: Emergency Medicine

## 2008-12-06 DIAGNOSIS — G903 Multi-system degeneration of the autonomic nervous system: Secondary | ICD-10-CM | POA: Insufficient documentation

## 2008-12-06 DIAGNOSIS — I951 Orthostatic hypotension: Secondary | ICD-10-CM

## 2008-12-06 HISTORY — DX: Orthostatic hypotension: I95.1

## 2008-12-07 ENCOUNTER — Telehealth: Payer: Self-pay | Admitting: Internal Medicine

## 2008-12-16 ENCOUNTER — Ambulatory Visit: Payer: Self-pay | Admitting: Internal Medicine

## 2008-12-17 LAB — CONVERTED CEMR LAB
Basophils Absolute: 0 K/uL
Basophils Relative: 0.4 %
Eosinophils Absolute: 0.2 K/uL
Eosinophils Relative: 3 %
HCT: 35.2 % — ABNORMAL LOW
Hemoglobin: 11.8 g/dL — ABNORMAL LOW
Lymphocytes Relative: 41.6 %
MCHC: 33.5 g/dL
MCV: 83.6 fL
Monocytes Absolute: 0.4 K/uL
Monocytes Relative: 7.4 %
Neutro Abs: 2.9 K/uL
Neutrophils Relative %: 47.6 %
Platelets: 197 K/uL
RBC: 4.21 M/uL
RDW: 13.3 %
WBC: 6 10*3/microliter

## 2009-01-04 ENCOUNTER — Ambulatory Visit: Payer: Self-pay | Admitting: Internal Medicine

## 2009-01-04 LAB — CONVERTED CEMR LAB
Glucose, Urine, Semiquant: NEGATIVE
Nitrite: POSITIVE
Specific Gravity, Urine: 1.02
pH: 7

## 2009-01-05 ENCOUNTER — Encounter: Payer: Self-pay | Admitting: Internal Medicine

## 2009-01-22 ENCOUNTER — Ambulatory Visit: Payer: Self-pay | Admitting: Internal Medicine

## 2009-01-22 LAB — CONVERTED CEMR LAB
Blood in Urine, dipstick: NEGATIVE
Glucose, Urine, Semiquant: NEGATIVE
Protein, U semiquant: NEGATIVE
Urobilinogen, UA: 0.2
pH: 6.5

## 2009-01-23 ENCOUNTER — Encounter: Payer: Self-pay | Admitting: Internal Medicine

## 2009-03-03 ENCOUNTER — Ambulatory Visit: Payer: Self-pay | Admitting: Internal Medicine

## 2009-03-03 LAB — CONVERTED CEMR LAB
Glucose, Urine, Semiquant: NEGATIVE
Ketones, urine, test strip: NEGATIVE
Protein, U semiquant: NEGATIVE
Specific Gravity, Urine: 1.02
pH: 5.5

## 2009-03-04 ENCOUNTER — Encounter: Payer: Self-pay | Admitting: Internal Medicine

## 2009-03-08 ENCOUNTER — Telehealth: Payer: Self-pay | Admitting: Internal Medicine

## 2009-03-23 ENCOUNTER — Encounter: Payer: Self-pay | Admitting: Internal Medicine

## 2009-03-29 ENCOUNTER — Telehealth: Payer: Self-pay | Admitting: Internal Medicine

## 2009-04-05 ENCOUNTER — Ambulatory Visit: Payer: Self-pay | Admitting: Internal Medicine

## 2009-05-05 ENCOUNTER — Ambulatory Visit: Payer: Self-pay | Admitting: Internal Medicine

## 2009-05-06 LAB — CONVERTED CEMR LAB
Basophils Relative: 0.3 % (ref 0.0–3.0)
CO2: 29 meq/L (ref 19–32)
Chloride: 109 meq/L (ref 96–112)
Eosinophils Relative: 4.2 % (ref 0.0–5.0)
HCT: 33.9 % — ABNORMAL LOW (ref 36.0–46.0)
Hemoglobin: 11.4 g/dL — ABNORMAL LOW (ref 12.0–15.0)
Lymphs Abs: 2.4 10*3/uL (ref 0.7–4.0)
MCV: 84.7 fL (ref 78.0–100.0)
Monocytes Relative: 8.6 % (ref 3.0–12.0)
Neutro Abs: 1.2 10*3/uL — ABNORMAL LOW (ref 1.4–7.7)
Potassium: 3.1 meq/L — ABNORMAL LOW (ref 3.5–5.1)
RBC: 4 M/uL (ref 3.87–5.11)
TSH: 1.16 microintl units/mL (ref 0.35–5.50)
WBC: 4.2 10*3/uL — ABNORMAL LOW (ref 4.5–10.5)

## 2009-05-07 ENCOUNTER — Ambulatory Visit (HOSPITAL_BASED_OUTPATIENT_CLINIC_OR_DEPARTMENT_OTHER): Admission: RE | Admit: 2009-05-07 | Discharge: 2009-05-07 | Payer: Self-pay | Admitting: Urology

## 2009-05-12 ENCOUNTER — Ambulatory Visit: Payer: Self-pay | Admitting: Internal Medicine

## 2009-05-12 LAB — CONVERTED CEMR LAB
Eosinophils Absolute: 0.1 10*3/uL (ref 0.0–0.7)
Eosinophils Relative: 1.6 % (ref 0.0–5.0)
HCT: 34.1 % — ABNORMAL LOW (ref 36.0–46.0)
Lymphs Abs: 2.3 10*3/uL (ref 0.7–4.0)
MCHC: 33.5 g/dL (ref 30.0–36.0)
MCV: 84.6 fL (ref 78.0–100.0)
Monocytes Absolute: 0.4 10*3/uL (ref 0.1–1.0)
Platelets: 204 10*3/uL (ref 150.0–400.0)
RDW: 13.8 % (ref 11.5–14.6)
WBC: 4.9 10*3/uL (ref 4.5–10.5)

## 2009-06-03 ENCOUNTER — Telehealth: Payer: Self-pay | Admitting: Internal Medicine

## 2009-06-07 ENCOUNTER — Ambulatory Visit: Payer: Self-pay | Admitting: Internal Medicine

## 2009-06-20 ENCOUNTER — Emergency Department (HOSPITAL_COMMUNITY): Admission: EM | Admit: 2009-06-20 | Discharge: 2009-06-20 | Payer: Self-pay | Admitting: Emergency Medicine

## 2009-06-21 ENCOUNTER — Telehealth: Payer: Self-pay | Admitting: Internal Medicine

## 2009-07-12 ENCOUNTER — Telehealth: Payer: Self-pay | Admitting: Internal Medicine

## 2009-07-12 ENCOUNTER — Ambulatory Visit: Payer: Self-pay | Admitting: Internal Medicine

## 2009-07-13 LAB — CONVERTED CEMR LAB
BUN: 12 mg/dL (ref 6–23)
Creatinine, Ser: 0.6 mg/dL (ref 0.4–1.2)
Eosinophils Absolute: 0.1 10*3/uL (ref 0.0–0.7)
Eosinophils Relative: 3 % (ref 0.0–5.0)
Folate: 9.4 ng/mL
GFR calc non Af Amer: 108.01 mL/min (ref >60)
MCV: 85.6 fL (ref 78.0–100.0)
Monocytes Absolute: 0.3 10*3/uL (ref 0.1–1.0)
Neutrophils Relative %: 62.6 % (ref 43.0–77.0)
Platelets: 193 10*3/uL (ref 150.0–400.0)
TSH: 0.6 microintl units/mL (ref 0.35–5.50)
Vitamin B-12: >1500 pg/mL — ABNORMAL HIGH (ref 211–911)
WBC: 4.7 10*3/uL (ref 4.5–10.5)

## 2009-08-23 ENCOUNTER — Ambulatory Visit: Payer: Self-pay | Admitting: Internal Medicine

## 2009-08-23 LAB — CONVERTED CEMR LAB
ALT: 12 units/L (ref 0–35)
Alkaline Phosphatase: 65 units/L (ref 39–117)
Basophils Relative: 0.2 % (ref 0.0–3.0)
Bilirubin, Direct: 0.1 mg/dL (ref 0.0–0.3)
Calcium: 9.2 mg/dL (ref 8.4–10.5)
Creatinine, Ser: 0.8 mg/dL (ref 0.4–1.2)
Eosinophils Absolute: 0.2 10*3/uL (ref 0.0–0.7)
Eosinophils Relative: 3.2 % (ref 0.0–5.0)
Lymphocytes Relative: 38.5 % (ref 12.0–46.0)
Neutrophils Relative %: 49.8 % (ref 43.0–77.0)
RBC: 3.98 M/uL (ref 3.87–5.11)
Total Protein: 6.4 g/dL (ref 6.0–8.3)
WBC: 4.8 10*3/uL (ref 4.5–10.5)

## 2009-08-24 ENCOUNTER — Encounter: Payer: Self-pay | Admitting: Internal Medicine

## 2009-08-26 ENCOUNTER — Telehealth: Payer: Self-pay | Admitting: Internal Medicine

## 2009-08-27 DIAGNOSIS — M810 Age-related osteoporosis without current pathological fracture: Secondary | ICD-10-CM

## 2009-08-27 HISTORY — DX: Age-related osteoporosis without current pathological fracture: M81.0

## 2009-08-29 ENCOUNTER — Encounter (INDEPENDENT_AMBULATORY_CARE_PROVIDER_SITE_OTHER): Payer: Self-pay | Admitting: *Deleted

## 2009-08-29 ENCOUNTER — Emergency Department (HOSPITAL_COMMUNITY): Admission: EM | Admit: 2009-08-29 | Discharge: 2009-08-30 | Payer: Self-pay | Admitting: Emergency Medicine

## 2009-08-29 ENCOUNTER — Telehealth: Payer: Self-pay | Admitting: Internal Medicine

## 2009-08-31 ENCOUNTER — Ambulatory Visit: Payer: Self-pay | Admitting: Internal Medicine

## 2009-09-02 LAB — CONVERTED CEMR LAB
ALT: 16 units/L (ref 0–35)
Albumin: 3.9 g/dL (ref 3.5–5.2)
Alkaline Phosphatase: 73 units/L (ref 39–117)
Basophils Absolute: 0 10*3/uL (ref 0.0–0.1)
CO2: 28 meq/L (ref 19–32)
Calcium: 9.3 mg/dL (ref 8.4–10.5)
Chloride: 106 meq/L (ref 96–112)
Glucose, Bld: 91 mg/dL (ref 70–99)
Hemoglobin: 11.6 g/dL — ABNORMAL LOW (ref 12.0–15.0)
Lymphocytes Relative: 26.3 % (ref 12.0–46.0)
Monocytes Relative: 5.7 % (ref 3.0–12.0)
Neutro Abs: 3.1 10*3/uL (ref 1.4–7.7)
Neutrophils Relative %: 66.6 % (ref 43.0–77.0)
Platelets: 170 10*3/uL (ref 150.0–400.0)
RDW: 12.8 % (ref 11.5–14.6)
Sodium: 141 meq/L (ref 135–145)
TSH: 0.35 microintl units/mL (ref 0.35–5.50)
Total Protein: 6.9 g/dL (ref 6.0–8.3)

## 2009-09-22 ENCOUNTER — Ambulatory Visit: Payer: Self-pay | Admitting: Family Medicine

## 2009-09-22 ENCOUNTER — Encounter: Payer: Self-pay | Admitting: Internal Medicine

## 2009-10-07 ENCOUNTER — Encounter: Admission: RE | Admit: 2009-10-07 | Discharge: 2009-10-07 | Payer: Self-pay | Admitting: Internal Medicine

## 2009-10-14 ENCOUNTER — Ambulatory Visit: Payer: Self-pay | Admitting: Internal Medicine

## 2009-10-18 ENCOUNTER — Encounter: Admission: RE | Admit: 2009-10-18 | Discharge: 2009-10-18 | Payer: Self-pay | Admitting: Internal Medicine

## 2009-11-10 ENCOUNTER — Telehealth: Payer: Self-pay | Admitting: Internal Medicine

## 2009-11-10 ENCOUNTER — Ambulatory Visit: Payer: Self-pay | Admitting: Internal Medicine

## 2009-11-10 DIAGNOSIS — M810 Age-related osteoporosis without current pathological fracture: Secondary | ICD-10-CM | POA: Insufficient documentation

## 2009-11-10 HISTORY — DX: Age-related osteoporosis without current pathological fracture: M81.0

## 2009-11-10 LAB — CONVERTED CEMR LAB
ALT: 12 units/L (ref 0–35)
AST: 15 units/L (ref 0–37)
Basophils Relative: 0.4 % (ref 0.0–3.0)
Bilirubin, Direct: 0.1 mg/dL (ref 0.0–0.3)
Chloride: 104 meq/L (ref 96–112)
Eosinophils Relative: 4 % (ref 0.0–5.0)
GFR calc non Af Amer: 90.3 mL/min (ref >60)
HCT: 34.2 % — ABNORMAL LOW (ref 36.0–46.0)
Lymphs Abs: 1.8 10*3/uL (ref 0.7–4.0)
MCV: 86.1 fL (ref 78.0–100.0)
Monocytes Absolute: 0.3 10*3/uL (ref 0.1–1.0)
Monocytes Relative: 7.8 % (ref 3.0–12.0)
Potassium: 2.8 meq/L — CL (ref 3.5–5.1)
RBC: 3.97 M/uL (ref 3.87–5.11)
Sodium: 141 meq/L (ref 135–145)
TSH: 1.13 microintl units/mL (ref 0.35–5.50)
Tissue Transglutaminase Ab, IgA: 0.3 units (ref <7)
Total Protein: 6.7 g/dL (ref 6.0–8.3)
WBC: 3.6 10*3/uL — ABNORMAL LOW (ref 4.5–10.5)

## 2009-11-11 ENCOUNTER — Encounter: Payer: Self-pay | Admitting: Internal Medicine

## 2009-11-16 ENCOUNTER — Ambulatory Visit: Payer: Self-pay | Admitting: Internal Medicine

## 2009-11-16 LAB — CONVERTED CEMR LAB
BUN: 8 mg/dL (ref 6–23)
CO2: 25 meq/L (ref 19–32)
Calcium: 9.4 mg/dL (ref 8.4–10.5)
Chloride: 106 meq/L (ref 96–112)
Creatinine, Ser: 0.8 mg/dL (ref 0.4–1.2)

## 2009-12-13 ENCOUNTER — Ambulatory Visit: Payer: Self-pay | Admitting: Cardiology

## 2009-12-13 ENCOUNTER — Encounter (INDEPENDENT_AMBULATORY_CARE_PROVIDER_SITE_OTHER): Payer: Self-pay | Admitting: Internal Medicine

## 2009-12-27 ENCOUNTER — Ambulatory Visit: Payer: Self-pay | Admitting: Internal Medicine

## 2009-12-28 ENCOUNTER — Encounter: Payer: Self-pay | Admitting: Internal Medicine

## 2009-12-28 ENCOUNTER — Encounter (INDEPENDENT_AMBULATORY_CARE_PROVIDER_SITE_OTHER): Payer: Self-pay | Admitting: *Deleted

## 2009-12-28 LAB — CONVERTED CEMR LAB
Basophils Absolute: 0 10*3/uL (ref 0.0–0.1)
Basophils Relative: 0.7 % (ref 0.0–3.0)
Chloride: 106 meq/L (ref 96–112)
Eosinophils Absolute: 0.1 10*3/uL (ref 0.0–0.7)
GFR calc non Af Amer: 107.84 mL/min (ref >60)
Lymphocytes Relative: 37.3 % (ref 12.0–46.0)
MCHC: 32.7 g/dL (ref 30.0–36.0)
MCV: 85.8 fL (ref 78.0–100.0)
Monocytes Absolute: 0.3 10*3/uL (ref 0.1–1.0)
Neutrophils Relative %: 50.5 % (ref 43.0–77.0)
Platelets: 157 10*3/uL (ref 150.0–400.0)
Potassium: 3.9 meq/L (ref 3.5–5.1)
RBC: 4.19 M/uL (ref 3.87–5.11)
Sodium: 143 meq/L (ref 135–145)
TSH: 0.51 microintl units/mL (ref 0.35–5.50)

## 2010-01-07 ENCOUNTER — Encounter: Payer: Self-pay | Admitting: Internal Medicine

## 2010-01-19 ENCOUNTER — Ambulatory Visit: Payer: Self-pay | Admitting: Internal Medicine

## 2010-01-20 ENCOUNTER — Encounter: Payer: Self-pay | Admitting: Internal Medicine

## 2010-01-25 ENCOUNTER — Ambulatory Visit: Payer: Self-pay | Admitting: Internal Medicine

## 2010-01-25 DIAGNOSIS — I219 Acute myocardial infarction, unspecified: Secondary | ICD-10-CM | POA: Insufficient documentation

## 2010-01-25 DIAGNOSIS — I2510 Atherosclerotic heart disease of native coronary artery without angina pectoris: Secondary | ICD-10-CM

## 2010-01-25 DIAGNOSIS — I429 Cardiomyopathy, unspecified: Secondary | ICD-10-CM

## 2010-01-25 DIAGNOSIS — Z8679 Personal history of other diseases of the circulatory system: Secondary | ICD-10-CM

## 2010-01-25 HISTORY — DX: Cardiomyopathy, unspecified: I42.9

## 2010-01-25 HISTORY — DX: Atherosclerotic heart disease of native coronary artery without angina pectoris: I25.10

## 2010-01-25 HISTORY — DX: Personal history of other diseases of the circulatory system: Z86.79

## 2010-01-26 ENCOUNTER — Ambulatory Visit: Payer: Self-pay | Admitting: Cardiovascular Disease

## 2010-01-31 ENCOUNTER — Ambulatory Visit: Payer: Self-pay | Admitting: Internal Medicine

## 2010-02-08 DIAGNOSIS — M351 Other overlap syndromes: Secondary | ICD-10-CM

## 2010-02-08 DIAGNOSIS — M329 Systemic lupus erythematosus, unspecified: Secondary | ICD-10-CM | POA: Insufficient documentation

## 2010-02-08 DIAGNOSIS — Z8679 Personal history of other diseases of the circulatory system: Secondary | ICD-10-CM | POA: Insufficient documentation

## 2010-02-08 DIAGNOSIS — G9050 Complex regional pain syndrome I, unspecified: Secondary | ICD-10-CM

## 2010-02-08 DIAGNOSIS — I429 Cardiomyopathy, unspecified: Secondary | ICD-10-CM

## 2010-02-08 HISTORY — DX: Other overlap syndromes: M35.1

## 2010-02-08 HISTORY — DX: Systemic lupus erythematosus, unspecified: M32.9

## 2010-02-08 HISTORY — DX: Complex regional pain syndrome I, unspecified: G90.50

## 2010-02-11 ENCOUNTER — Ambulatory Visit: Payer: Self-pay | Admitting: Internal Medicine

## 2010-02-11 LAB — CONVERTED CEMR LAB
ALT: 10 units/L (ref 0–35)
AST: 16 units/L (ref 0–37)
Albumin: 4.1 g/dL (ref 3.5–5.2)
Basophils Absolute: 0 10*3/uL (ref 0.0–0.1)
CO2: 31 meq/L (ref 19–32)
Calcium: 9.7 mg/dL (ref 8.4–10.5)
Chloride: 106 meq/L (ref 96–112)
Creatinine, Ser: 0.7 mg/dL (ref 0.4–1.2)
Eosinophils Absolute: 0.1 10*3/uL (ref 0.0–0.7)
GFR calc non Af Amer: 90.23 mL/min (ref >60)
Hemoglobin: 11.4 g/dL — ABNORMAL LOW (ref 12.0–15.0)
Lymphocytes Relative: 43.2 % (ref 12.0–46.0)
Lymphs Abs: 1.6 10*3/uL (ref 0.7–4.0)
MCHC: 32.9 g/dL (ref 30.0–36.0)
Neutro Abs: 1.6 10*3/uL (ref 1.4–7.7)
Platelets: 145 10*3/uL — ABNORMAL LOW (ref 150.0–400.0)
Potassium: 4.3 meq/L (ref 3.5–5.1)
RDW: 12.8 % (ref 11.5–14.6)
Sodium: 142 meq/L (ref 135–145)
TSH: 0.55 microintl units/mL (ref 0.35–5.50)
Total Protein: 6.7 g/dL (ref 6.0–8.3)
Transferrin: 260 mg/dL (ref 212.0–360.0)
Vitamin B-12: 327 pg/mL (ref 211–911)

## 2010-02-16 ENCOUNTER — Telehealth (INDEPENDENT_AMBULATORY_CARE_PROVIDER_SITE_OTHER): Payer: Self-pay

## 2010-02-17 ENCOUNTER — Ambulatory Visit: Payer: Self-pay

## 2010-02-17 ENCOUNTER — Encounter: Payer: Self-pay | Admitting: Cardiovascular Disease

## 2010-02-17 ENCOUNTER — Ambulatory Visit (HOSPITAL_COMMUNITY): Admission: RE | Admit: 2010-02-17 | Discharge: 2010-02-17 | Payer: Self-pay | Admitting: Cardiovascular Disease

## 2010-02-17 ENCOUNTER — Ambulatory Visit: Payer: Self-pay | Admitting: Cardiology

## 2010-02-21 ENCOUNTER — Ambulatory Visit: Payer: Self-pay | Admitting: Internal Medicine

## 2010-02-22 ENCOUNTER — Telehealth: Payer: Self-pay | Admitting: Internal Medicine

## 2010-02-22 DIAGNOSIS — K219 Gastro-esophageal reflux disease without esophagitis: Secondary | ICD-10-CM | POA: Insufficient documentation

## 2010-02-22 HISTORY — DX: Gastro-esophageal reflux disease without esophagitis: K21.9

## 2010-02-23 ENCOUNTER — Ambulatory Visit (HOSPITAL_COMMUNITY)
Admission: RE | Admit: 2010-02-23 | Discharge: 2010-02-23 | Payer: Self-pay | Source: Home / Self Care | Admitting: Internal Medicine

## 2010-02-24 ENCOUNTER — Telehealth: Payer: Self-pay | Admitting: Internal Medicine

## 2010-02-24 ENCOUNTER — Encounter: Payer: Self-pay | Admitting: Internal Medicine

## 2010-02-25 ENCOUNTER — Ambulatory Visit: Payer: Self-pay | Admitting: Internal Medicine

## 2010-02-25 LAB — CONVERTED CEMR LAB
Blood in Urine, dipstick: NEGATIVE
Glucose, Urine, Semiquant: NEGATIVE
Ketones, urine, test strip: NEGATIVE
Protein, U semiquant: NEGATIVE
Specific Gravity, Urine: 1.01
pH: 7

## 2010-02-26 ENCOUNTER — Encounter: Payer: Self-pay | Admitting: Internal Medicine

## 2010-03-01 ENCOUNTER — Telehealth: Payer: Self-pay | Admitting: Internal Medicine

## 2010-03-02 ENCOUNTER — Ambulatory Visit: Payer: Self-pay | Admitting: Internal Medicine

## 2010-03-09 ENCOUNTER — Ambulatory Visit: Payer: Self-pay | Admitting: Internal Medicine

## 2010-03-10 ENCOUNTER — Encounter: Payer: Self-pay | Admitting: Internal Medicine

## 2010-03-21 ENCOUNTER — Telehealth: Payer: Self-pay | Admitting: Internal Medicine

## 2010-03-23 ENCOUNTER — Ambulatory Visit: Payer: Self-pay | Admitting: Internal Medicine

## 2010-03-27 ENCOUNTER — Emergency Department (HOSPITAL_COMMUNITY): Admission: EM | Admit: 2010-03-27 | Discharge: 2010-03-27 | Payer: Self-pay | Admitting: Emergency Medicine

## 2010-03-29 ENCOUNTER — Ambulatory Visit: Payer: Self-pay | Admitting: Internal Medicine

## 2010-03-29 LAB — CONVERTED CEMR LAB
Bilirubin Urine: NEGATIVE
Ketones, urine, test strip: NEGATIVE
Specific Gravity, Urine: 1.01
pH: 7

## 2010-04-06 ENCOUNTER — Ambulatory Visit: Payer: Self-pay | Admitting: Internal Medicine

## 2010-04-07 ENCOUNTER — Encounter: Payer: Self-pay | Admitting: Internal Medicine

## 2010-04-08 ENCOUNTER — Ambulatory Visit: Payer: Self-pay | Admitting: Internal Medicine

## 2010-04-11 LAB — CONVERTED CEMR LAB
ALT: 10 units/L (ref 0–35)
AST: 18 units/L (ref 0–37)
Basophils Relative: 0.9 % (ref 0.0–3.0)
Eosinophils Relative: 3.9 % (ref 0.0–5.0)
GFR calc non Af Amer: 101.84 mL/min (ref >60)
HCT: 32.5 % — ABNORMAL LOW (ref 36.0–46.0)
Hemoglobin: 11 g/dL — ABNORMAL LOW (ref 12.0–15.0)
Lymphs Abs: 1.8 10*3/uL (ref 0.7–4.0)
Monocytes Relative: 8.1 % (ref 3.0–12.0)
Neutro Abs: 1.3 10*3/uL — ABNORMAL LOW (ref 1.4–7.7)
Platelets: 177 10*3/uL (ref 150.0–400.0)
Potassium: 3.7 meq/L (ref 3.5–5.1)
RBC: 3.84 M/uL — ABNORMAL LOW (ref 3.87–5.11)
Sodium: 144 meq/L (ref 135–145)
Total Bilirubin: 0.6 mg/dL (ref 0.3–1.2)
WBC: 3.5 10*3/uL — ABNORMAL LOW (ref 4.5–10.5)

## 2010-04-12 ENCOUNTER — Ambulatory Visit: Payer: Self-pay | Admitting: Internal Medicine

## 2010-04-15 ENCOUNTER — Ambulatory Visit: Payer: Self-pay | Admitting: Internal Medicine

## 2010-04-19 ENCOUNTER — Ambulatory Visit (HOSPITAL_COMMUNITY)
Admission: RE | Admit: 2010-04-19 | Discharge: 2010-04-19 | Payer: Self-pay | Source: Home / Self Care | Admitting: Internal Medicine

## 2010-04-21 ENCOUNTER — Telehealth: Payer: Self-pay | Admitting: Internal Medicine

## 2010-04-21 LAB — CONVERTED CEMR LAB
IgA: 160 mg/dL (ref 68–378)
IgG (Immunoglobin G), Serum: 822 mg/dL (ref 694–1618)
IgM, Serum: 142 mg/dL (ref 60–263)

## 2010-05-05 ENCOUNTER — Telehealth: Payer: Self-pay

## 2010-05-05 ENCOUNTER — Ambulatory Visit: Payer: Self-pay | Admitting: Internal Medicine

## 2010-05-10 ENCOUNTER — Telehealth: Payer: Self-pay | Admitting: Internal Medicine

## 2010-05-11 ENCOUNTER — Ambulatory Visit: Payer: Self-pay | Admitting: Internal Medicine

## 2010-05-11 DIAGNOSIS — R21 Rash and other nonspecific skin eruption: Secondary | ICD-10-CM | POA: Insufficient documentation

## 2010-05-11 HISTORY — DX: Rash and other nonspecific skin eruption: R21

## 2010-05-11 LAB — CONVERTED CEMR LAB
ALT: 10 units/L (ref 0–35)
AST: 18 units/L (ref 0–37)
Alkaline Phosphatase: 70 units/L (ref 39–117)
Basophils Absolute: 0 10*3/uL (ref 0.0–0.1)
Bilirubin, Direct: 0.1 mg/dL (ref 0.0–0.3)
CO2: 34 meq/L — ABNORMAL HIGH (ref 19–32)
Chloride: 102 meq/L (ref 96–112)
Eosinophils Absolute: 0.2 10*3/uL (ref 0.0–0.7)
Hemoglobin: 12.1 g/dL (ref 12.0–15.0)
Lymphocytes Relative: 35.1 % (ref 12.0–46.0)
MCHC: 33.9 g/dL (ref 30.0–36.0)
Monocytes Relative: 7.4 % (ref 3.0–12.0)
Neutro Abs: 1.9 10*3/uL (ref 1.4–7.7)
Neutrophils Relative %: 51.7 % (ref 43.0–77.0)
Potassium: 4.1 meq/L (ref 3.5–5.1)
RDW: 14.2 % (ref 11.5–14.6)
Sodium: 142 meq/L (ref 135–145)
Total Bilirubin: 0.4 mg/dL (ref 0.3–1.2)
Total Protein: 6.5 g/dL (ref 6.0–8.3)

## 2010-05-13 ENCOUNTER — Ambulatory Visit: Payer: Self-pay | Admitting: Internal Medicine

## 2010-05-16 ENCOUNTER — Ambulatory Visit: Payer: Self-pay | Admitting: Internal Medicine

## 2010-05-16 ENCOUNTER — Telehealth: Payer: Self-pay | Admitting: Internal Medicine

## 2010-05-19 ENCOUNTER — Ambulatory Visit: Payer: Self-pay | Admitting: Internal Medicine

## 2010-06-01 ENCOUNTER — Ambulatory Visit: Payer: Self-pay | Admitting: Internal Medicine

## 2010-06-01 ENCOUNTER — Telehealth: Payer: Self-pay

## 2010-06-02 ENCOUNTER — Telehealth: Payer: Self-pay | Admitting: Internal Medicine

## 2010-06-07 ENCOUNTER — Ambulatory Visit: Payer: Self-pay | Admitting: Internal Medicine

## 2010-06-08 ENCOUNTER — Encounter: Payer: Self-pay | Admitting: Internal Medicine

## 2010-06-10 ENCOUNTER — Telehealth: Payer: Self-pay | Admitting: Internal Medicine

## 2010-06-22 ENCOUNTER — Ambulatory Visit: Payer: Self-pay | Admitting: Internal Medicine

## 2010-07-01 ENCOUNTER — Ambulatory Visit: Payer: Self-pay | Admitting: Internal Medicine

## 2010-07-20 ENCOUNTER — Ambulatory Visit: Payer: Self-pay | Admitting: Internal Medicine

## 2010-07-21 ENCOUNTER — Encounter: Payer: Self-pay | Admitting: Internal Medicine

## 2010-07-22 LAB — CONVERTED CEMR LAB
ALT: 11 units/L (ref 0–35)
AST: 17 units/L (ref 0–37)
Albumin: 4.3 g/dL (ref 3.5–5.2)
Alkaline Phosphatase: 54 units/L (ref 39–117)
CO2: 30 meq/L (ref 19–32)
Calcium: 10 mg/dL (ref 8.4–10.5)
Cholesterol: 205 mg/dL — ABNORMAL HIGH (ref 0–200)
Creatinine, Ser: 0.6 mg/dL (ref 0.4–1.2)
Direct LDL: 105.5 mg/dL
Eosinophils Absolute: 0.1 10*3/uL (ref 0.0–0.7)
Eosinophils Relative: 2 % (ref 0.0–5.0)
GFR calc non Af Amer: 105.61 mL/min (ref >60)
Glucose, Bld: 98 mg/dL (ref 70–99)
HCT: 34.9 % — ABNORMAL LOW (ref 36.0–46.0)
Lymphs Abs: 1.2 10*3/uL (ref 0.7–4.0)
MCHC: 34.2 g/dL (ref 30.0–36.0)
MCV: 84.2 fL (ref 78.0–100.0)
Monocytes Absolute: 0.2 10*3/uL (ref 0.1–1.0)
Neutrophils Relative %: 58.5 % (ref 43.0–77.0)
Platelets: 179 10*3/uL (ref 150.0–400.0)
RDW: 13.9 % (ref 11.5–14.6)
Total CHOL/HDL Ratio: 3
WBC: 3.7 10*3/uL — ABNORMAL LOW (ref 4.5–10.5)

## 2010-08-19 ENCOUNTER — Telehealth: Payer: Self-pay | Admitting: Internal Medicine

## 2010-08-24 ENCOUNTER — Telehealth: Payer: Self-pay | Admitting: Internal Medicine

## 2010-09-02 ENCOUNTER — Ambulatory Visit: Payer: Self-pay | Admitting: Internal Medicine

## 2010-09-02 LAB — CONVERTED CEMR LAB
Basophils Absolute: 0 10*3/uL (ref 0.0–0.1)
Bilirubin, Direct: 0.1 mg/dL (ref 0.0–0.3)
CO2: 29 meq/L (ref 19–32)
Calcium: 9.7 mg/dL (ref 8.4–10.5)
Creatinine, Ser: 0.8 mg/dL (ref 0.4–1.2)
Eosinophils Absolute: 0.1 10*3/uL (ref 0.0–0.7)
HCT: 31.7 % — ABNORMAL LOW (ref 36.0–46.0)
HDL: 61.5 mg/dL (ref >39.00)
Hemoglobin: 10.4 g/dL — ABNORMAL LOW (ref 12.0–15.0)
Lymphs Abs: 1.8 10*3/uL (ref 0.7–4.0)
MCHC: 33 g/dL (ref 30.0–36.0)
MCV: 84.8 fL (ref 78.0–100.0)
Monocytes Absolute: 0.3 10*3/uL (ref 0.1–1.0)
Neutro Abs: 1.6 10*3/uL (ref 1.4–7.7)
Nitrite: NEGATIVE
Protein, U semiquant: NEGATIVE
RDW: 14.1 % (ref 11.5–14.6)
Total Bilirubin: 0.6 mg/dL (ref 0.3–1.2)
Total CHOL/HDL Ratio: 3
Total Protein: 6.8 g/dL (ref 6.0–8.3)
Triglycerides: 143 mg/dL (ref 0.0–149.0)

## 2010-09-03 ENCOUNTER — Encounter: Payer: Self-pay | Admitting: Internal Medicine

## 2010-09-04 ENCOUNTER — Emergency Department (HOSPITAL_COMMUNITY): Admission: EM | Admit: 2010-09-04 | Discharge: 2010-09-04 | Payer: Self-pay | Admitting: Emergency Medicine

## 2010-09-06 ENCOUNTER — Telehealth: Payer: Self-pay | Admitting: Internal Medicine

## 2010-09-15 ENCOUNTER — Ambulatory Visit: Payer: Self-pay | Admitting: Internal Medicine

## 2010-09-22 ENCOUNTER — Encounter: Payer: Self-pay | Admitting: Internal Medicine

## 2010-09-22 ENCOUNTER — Ambulatory Visit: Payer: Self-pay | Admitting: Internal Medicine

## 2010-09-23 ENCOUNTER — Encounter: Payer: Self-pay | Admitting: Internal Medicine

## 2010-10-12 ENCOUNTER — Ambulatory Visit: Payer: Self-pay | Admitting: Internal Medicine

## 2010-11-01 ENCOUNTER — Encounter: Admission: RE | Admit: 2010-11-01 | Discharge: 2010-11-01 | Payer: Self-pay | Admitting: Internal Medicine

## 2010-11-01 LAB — HM MAMMOGRAPHY

## 2010-11-03 ENCOUNTER — Inpatient Hospital Stay (HOSPITAL_COMMUNITY): Admission: EM | Admit: 2010-11-03 | Discharge: 2009-12-14 | Payer: Self-pay | Admitting: Emergency Medicine

## 2010-11-15 ENCOUNTER — Ambulatory Visit: Payer: Self-pay | Admitting: Internal Medicine

## 2010-11-15 ENCOUNTER — Encounter: Payer: Self-pay | Admitting: Internal Medicine

## 2010-12-19 ENCOUNTER — Telehealth: Payer: Self-pay | Admitting: Internal Medicine

## 2010-12-21 ENCOUNTER — Ambulatory Visit
Admission: RE | Admit: 2010-12-21 | Discharge: 2010-12-21 | Payer: Self-pay | Source: Home / Self Care | Attending: Internal Medicine | Admitting: Internal Medicine

## 2010-12-25 LAB — CONVERTED CEMR LAB
BUN: 15 mg/dL (ref 6–23)
Basophils Absolute: 0 10*3/uL (ref 0.0–0.1)
Chloride: 105 meq/L (ref 96–112)
Eosinophils Absolute: 0.1 10*3/uL (ref 0.0–0.7)
GFR calc Af Amer: 110 mL/min
GFR calc non Af Amer: 91 mL/min
HCT: 36.1 % (ref 36.0–46.0)
MCHC: 33.8 g/dL (ref 30.0–36.0)
MCV: 82 fL (ref 78.0–100.0)
Monocytes Absolute: 0.3 10*3/uL (ref 0.1–1.0)
Neutrophils Relative %: 57.9 % (ref 43.0–77.0)
Platelets: 249 10*3/uL (ref 150–400)
Potassium: 3.4 meq/L — ABNORMAL LOW (ref 3.5–5.1)
RDW: 13.3 % (ref 11.5–14.6)
Sodium: 143 meq/L (ref 135–145)
TSH: 0.78 microintl units/mL (ref 0.35–5.50)

## 2010-12-29 NOTE — Miscellaneous (Signed)
Summary: Office Visit   Vital Signs:  Patient profile:   65 year old female Weight:      128 pounds Temp:     97.8 degrees F oral Resp:     12 per minute BP sitting:   102 / 60  Vitals Entered By: Lynann Beaver CMA AAMA (September 22, 2010 8:52 AM) CC: rov Is Patient Diabetic? No Pain Assessment Patient in pain? no        CC:  rov.  History of Present Illness:  Follow-Up Visit      [name] who presents for Follow-up visit.  The patient denies chest pain and palpitations.  Since the last visit the patient notes no new problems or concerns.  The patient reports taking meds as prescribed.  When questioned about possible medication side effects, the patient notes none.  no recurrent presyncope/syncope (suspect all related to autonomic dysfunction in the past)  has chronic fatigue, chronic aches and pains, rash-continues to improve (thought ot be autoimmune). has occasional headache and is generally agitated. All other systems reviewed and were negative   Current Medications (verified): 1)  Duragesic-50 50 Mcg/hr Pt72 (Fentanyl) .... Change Every 72 Hours As Directed 2)  Cyanocobalamin 1000 Mcg/ml Soln (Cyanocobalamin) .... Inject Every Three Months At Dr Cato Mulligan Office 3)  Omeprazole 20 Mg Cpdr (Omeprazole) .... One By Mouth Daily 4)  Miralax  Powd (Polyethylene Glycol 3350) .... As Needed 5)  Senokot 8.6 Mg Tabs (Sennosides) .... Take 1 Tablet By Mouth Three Times A Day Hold For Diarrhea 6)  Triamcinolone Acetonide 0.5 % Crea (Triamcinolone Acetonide) .... Apply Bid To Affected Area 7)  Cellcept 500 Mg Tabs (Mycophenolate Mofetil) .... Take 1/2 Tablet By Mouth Once A Day 8)  Klonopin 0.5 Mg Tabs (Clonazepam) .... Two Times A Day  Allergies (verified): 1)  Morphine Sulfate (Morphine Sulfate) 2)  Sulfamethoxazole (Sulfamethoxazole) 3)  Tofranil (Imipramine Hcl) 4)  * Unna Boot    Physical Exam  General:  chronically ill-appearing female in no acute  distress. Mildly pale-improved from previous visist. Neck is supple. Cardiac exam S1-S2 are regular. Dermatologic exam she has excoriations over the lower extremities bilaterally. No lower extremity edema.abdomen: soft, nontender   Impression & Recommendations:  Problem # 1:  RASH-NONVESICULAR (ICD-782.1) resolved Her updated medication list for this problem includes:    Triamcinolone Acetonide 0.5 % Crea (Triamcinolone acetonide) .Marland Kitchen... Apply bid to affected area  Problem # 2:  GERD (ICD-530.81) controlled continue current medications  Her updated medication list for this problem includes:    Omeprazole 20 Mg Cpdr (Omeprazole) ..... One by mouth daily  Problem # 3:  HYPOTENSION, ORTHOSTATIC (ICD-458.0) resolved currently  Problem # 4:  CHRONIC PAIN SYNDROME (ICD-338.4) doing reasonably well  Complete Medication List: 1)  Duragesic-50 50 Mcg/hr Pt72 (Fentanyl) .... Change every 72 hours as directed 2)  Cyanocobalamin 1000 Mcg/ml Soln (Cyanocobalamin) .... Inject every three months at dr swords office 3)  Omeprazole 20 Mg Cpdr (Omeprazole) .... One by mouth daily 4)  Miralax Powd (Polyethylene glycol 3350) .... As needed 5)  Senokot 8.6 Mg Tabs (Sennosides) .... Take 1 tablet by mouth three times a day hold for diarrhea 6)  Triamcinolone Acetonide 0.5 % Crea (Triamcinolone acetonide) .... Apply bid to affected area 7)  Cellcept 500 Mg Tabs (Mycophenolate mofetil) .... Take 1/2 tablet by mouth once a day 8)  Klonopin 0.5 Mg Tabs (Clonazepam) .... Two times a day  Other Orders: Vit B12 1000 mcg (  J1914) T-Culture, Urine ([id number])   Patient Instructions: 1)  3 weeks. zostavax at that visit Prescriptions: KLONOPIN 0.5 MG TABS (CLONAZEPAM) two times a day  #60 x 0   Entered and Authorized by:   [name] MD   Signed by:   [name] MD on 09/22/2010   Method used:   Print then Give to Patient   RxID:   [id number] DURAGESIC-50 50 MCG/HR PT72 (FENTANYL) change every  72 hours as directed  #10 x 0   Entered and Authorized by:   [name] MD   Signed by:   [name] MD on 09/22/2010   Method used:   Print then Give to Patient   RxID:   [id number]      Medication Administration  Injection # 1:    Medication: Vit B12 1000 mcg    Diagnosis: B12 DEFICIENCY (ICD-266.2)    Route: IM  Orders Added: 1)  Vit B12 1000 mcg [J3420] 2)  T-Culture, Urine [[id number]] 3)  Est. Patient Level IV [38756]   Appended Document: Orders Update     Clinical Lists Changes  Orders: Added new Service order of Specimen Handling ([id number]) - Signed

## 2010-12-29 NOTE — Progress Notes (Signed)
Summary: REFILL REQUEST  Phone Note Refill Request Message from:  Patient on August 19, 2010 9:31 AM  Refills Requested: Medication #1:  DURAGESIC-50 50 MCG/HR PT72 change every 72 hours as directed   Notes: Pt can be reached at [phone] when Rx is ready.    Initial call taken by: Debbra Riding,  August 19, 2010 9:32 AM  Follow-up for Phone Call        ok Follow-up by: [name] MD,  August 19, 2010 12:10 PM     Appended Document: REFILL REQUEST     Clinical Lists Changes  Medications: Rx of DURAGESIC-50 50 MCG/HR PT72 (FENTANYL) change every 72 hours as directed;  #10 x 0;  Signed;  Entered by: Romualdo Bolk, CMA (AAMA);  Authorized by: [name] MD;  Method used: Print then Give to Patient    Prescriptions: DURAGESIC-50 50 MCG/HR PT72 (FENTANYL) change every 72 hours as directed  #10 x 0   Entered by:   Romualdo Bolk, CMA (AAMA)   Authorized by:   [name] MD   Signed by:   Romualdo Bolk, CMA (AAMA) on 08/19/2010   Method used:   Print then Give to Patient   RxID:   [id number]   Left message on machine that rx is ready to pick up. Romualdo Bolk, CMA (AAMA)  August 19, 2010 1:57 PM

## 2010-12-29 NOTE — Assessment & Plan Note (Signed)
Summary: 1 month f/u//alp   Vital Signs:  Patient profile:   65 year old [name] Weight:      128 pounds Temp:     98.4 degrees F oral Pulse rate:   78 / minute BP sitting:   94 / 68  (left arm) Cuff size:   regular  Vitals Entered By: Alfred Levins, CMA (November 15, 2010 10:48 AM) CC: f/u UTI   CC:  f/u UTI.  History of Present Illness:  Follow-Up Visit      [name] who presents for Follow-up visit.  The patient denies chest pain and palpitations.  Since the last visit the patient notes no new problems or concerns except has dysuria and back pain without fever or chills.  The patient reports taking meds as prescribed.  When questioned about possible medication side effects, the patient notes none.    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 Change Every 72 Hours As Directed 2)  Cyanocobalamin 1000 Mcg/ml Soln (Cyanocobalamin) .... Inject Every Three Months At Dr Cato Mulligan Office 3)  Omeprazole 20 Mg Cpdr (Omeprazole) .... One By Mouth Daily 4)  Miralax  Powd (Polyethylene Glycol 3350) .... As Needed 5)  Senokot 8.6 Mg Tabs (Sennosides) .... Take 1 Tablet By Mouth Three Times A Day Hold For Diarrhea 6)  Triamcinolone Acetonide 0.5 % Crea (Triamcinolone Acetonide) .... Apply Bid To Affected Area 7)  Cellcept 500 Mg Tabs  (Mycophenolate Mofetil) .... Take 1/2 Tablet By Mouth Once A Day 8)  Klonopin 0.5 Mg Tabs (Clonazepam) .... Two Times A Day  Allergies (verified): 1)  Morphine Sulfate (Morphine Sulfate) 2)  Sulfamethoxazole (Sulfamethoxazole) 3)  Tofranil (Imipramine Hcl) 4)  * Unna Boot  Physical Exam  General:  thin [name] in no acute distress. She appears somewhat chronically ill. HEENT exam atraumatic, normocephalic him our Chalmers Guest is moist and pink. Neck is supple. Chest clear to auscultation cardiac exam S1-S2 are regular. No CVA tenderness. Extremities no clubbing cyanosis or edema. Dermatologic exam no rash.   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 1 by mouth 2 times daily  Orders: T-Culture, Urine ([id number])  Problem # 2:  HYPOTENSION, ORTHOSTATIC (ICD-458.0) resolved  Problem # 3:  FIBROMYALGIA (ICD-729.1) pain continues but reasonably well controlled Her updated medication list for this problem includes:    Duragesic-50 50 Mcg/hr Pt72 (Fentanyl) .Marland Kitchen... Change every 72 hours as directed  Complete Medication List: 1)  Duragesic-50 50 Mcg/hr Pt72 (Fentanyl) .... Change every 72 hours as directed 2)  Cyanocobalamin 1000 Mcg/ml Soln (Cyanocobalamin) .... Inject every three months at dr swords office 3)  Omeprazole 20 Mg Cpdr (Omeprazole) .... One by mouth daily 4)  Miralax Powd (  Polyethylene glycol 3350) .... As needed 5)  Senokot 8.6 Mg Tabs (Sennosides) .... Take 1 tablet by mouth three times a day hold for diarrhea 6)  Triamcinolone Acetonide 0.5 % Crea (Triamcinolone acetonide) .... Apply bid to affected area 7)  Cellcept 500 Mg Tabs (Mycophenolate mofetil) .... Take 1/2 tablet by mouth once a day 8)  Klonopin 0.5 Mg Tabs (Clonazepam) .... Two times a day 9)  Cipro 500 Mg Tabs (Ciprofloxacin hcl) .Marland Kitchen.. 1 by mouth 2 times  daily  Patient Instructions: 1)  3 months Prescriptions: DURAGESIC-50 50 MCG/HR PT72 (FENTANYL) change every 72 hours as directed  #10 x 0   Entered and Authorized by:   [name] MD   Signed by:   [name] MD on 11/15/2010   Method used:   Print then Give to Patient   RxID:   [id number] CIPRO 500 MG TABS (CIPROFLOXACIN HCL) 1 by mouth 2 times daily  #10 x 0   Entered and Authorized by:   [name] MD   Signed by:   [name] MD on 11/15/2010   Method used:   Electronically to        [facility] (retail)       [address], [address]       [place], [place]  [address]       Ph: [phone]       Fax: [phone]   RxID:   [id number]    Orders Added: 1)  Est. Patient Level IV [84166] 2)  T-Culture, Urine [[id number]]  Appended Document: 1 month f/u//alp  Laboratory Results   Urine Tests    Routine Urinalysis   Color: yellow Appearance: Hazy Glucose: negative   (Normal Range: Negative) Bilirubin: 1+   (Normal Range: Negative) Ketone: trace (5)   (Normal Range: Negative) Spec. Gravity: >=1.030   (Normal Range: 1.003-1.035) Blood: 1+   (Normal Range: Negative) pH: 5.5   (Normal Range: 5.0-8.0) Protein: 1+   (Normal Range: Negative) Urobilinogen: 0.2   (Normal Range: 0-1) Nitrite: negative   (Normal Range: Negative) Leukocyte Esterace: 2+   (Normal Range: Negative)    Comments: [name]  November 15, 2010 11:43 AM

## 2010-12-29 NOTE — Progress Notes (Signed)
Summary: Marina Gravel Scheduled   Phone Note Outgoing Call   Call placed by: Laureen Ochs LPN,  February 22, 2010 11:36 AM Call placed to: Patient Summary of Call: Per Endoscopy note from 02-21-10, pt. needs a Barium Esophagram. She is scheduled at [facility] on 02-23-10 at 11:30am, NPO 3 hours prior.  All appt. information reviewed with pt. by phone. Pt. instructed to call back as needed.   Initial call taken by: Laureen Ochs LPN,  February 22, 2010 11:38 AM  New Problems: DYSPHAGIA (ICD-787.29) GERD (ICD-530.81)   New Problems: DYSPHAGIA (ICD-787.29) GERD (ICD-530.81)

## 2010-12-29 NOTE — Progress Notes (Signed)
Summary: lab report & any changes?  about the same back pain not better  Phone Note Call from Patient Call back at [phone] [facility] cell   Caller: Patient Summary of Call: Pt just wanted to make Dr Cato Mulligan aware that she was in [facility] on Sunday 09/04/10 because her bp not stable and lack of energy. [facility] said pt was real weak, did blood work, wbc high, pt released on Sunday night.  Pt is at home.   Pls call.   Initial call taken by: Lucy Antigua,  September 06, 2010 10:33 AM  Follow-up for Phone Call        Left message on machine to return my call. Nicki Guadalajara Fergerson CMA Duncan Dull)  September 07, 2010 8:38 AM   Additional Follow-up for Phone Call Additional follow up Details #1::        Doing better.  Rested and stuff.  didn not hear from Dr. Cato Mulligan about her blood count.  Does she need to change anything?  Or does need to add anything.  No fever.  Fibromyalgia pain as always.  Pain in back where kidneys are that they gave her a shot at er is not really better.  No Bleeding .  Has 2 Cipro left & she will take them up today.   Additional Follow-up by: Rudy Jew, RN,  September 12, 2010 11:21 AM    Additional Follow-up for Phone Call Additional follow up Details #2::    have her f/u with me this week Follow-up by: [name] MD,  September 12, 2010 2:33 PM  Additional Follow-up for Phone Call Additional follow up Details #3:: Details for Additional Follow-up Action Taken: aPPOINTMENT MADE. Additional Follow-up by: Rudy Jew, RN,  September 12, 2010 4:37 PM

## 2010-12-29 NOTE — Assessment & Plan Note (Signed)
Summary: fup per dr//ccm   Vital Signs:  Patient profile:   65 year old female Pulse rate:   60 / minute Pulse rhythm:   regular Resp:     12 per minute BP sitting:   130 / 78  (left arm) Cuff size:   regular  Vitals Entered By: Gladis Riffle, RN (May 13, 2010 9:08 AM) CC: FU--Rash Is Patient Diabetic? No   CC:  FU--Rash.  History of Present Illness: f/u rash has noted improvement in right leg (was wrapped)---has new erythematous areas including left hand 9swelling).  rash has progressed to involove thighs and arms  prednisone---makes me nervious  still on doxycycline.  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) 8)  Hx of Lupus  (ICD-710.0) 9)  Cardiomyopathy  (ICD-425.4) 10)  Chronic Pain Syndrome  (ICD-338.4) 11)  Congestive Heart Failure, Hx of  (ICD-V12.50) 12)  Hypotension, Orthostatic  (ICD-458.0) 13)  Hyperlipidemia  (ICD-272.4) 14)  Myocardial Infarction  (ICD-410.90) 15)  Osteoporosis  (ICD-733.00) 16)  U T I  (ICD-599.0) 17)  Anemia-nos  (ICD-285.9) 18)  Ana Positive  (ICD-790.99) 19)  Dermatitis  (ICD-692.9) 20)  B12 Deficiency  (ICD-266.2) 21)  Hiatal Hernia  (ICD-553.3) 22)  Fibromyalgia  (ICD-729.1) 23)  Colonic Polyps, Hx of  (ICD-V12.72)  Current Medications (verified): 1)  Duragesic-50 50 Mcg/hr Pt72 (Fentanyl) .... Change Every 72 Hours As Directed 2)  Cyanocobalamin 1000 Mcg/ml Soln (Cyanocobalamin) .... Inject Every Three Months At Dr Cato Mulligan Office 3)  Clonazepam Odt 0.5 Mg  Tbdp (Clonazepam) .... One Two Times A Day As Needed 4)   Fludrocortisone Acetate 0.1 Mg  Tabs (Fludrocortisone Acetate) .... Take 1 Tablet By Mouth Once A Day 5)  Vitamin D 1000 Unit  Caps (Cholecalciferol) .... Once Daily 6)  Nexium 40 Mg Cpdr (Esomeprazole Magnesium) .... Take 1 Tablet By Mouth Two Times A Day (Pharmacy-Please Discontinue Prescription Previously Sent For Omeprazole) 7)  Klor-Con M20 20 Meq Cr-Tabs (Potassium Chloride Crys Cr) .... One Two Times A Day 8)  Ferrous Sulfate 325 (65 Fe) Mg Tabs (Ferrous Sulfate) .... Take 1 Tablet By Mouth Two Times A Day X 3 Months 9)  Trimethoprim 100 Mg Tabs (Trimethoprim) .... Take 1 Tablet By Mouth Once A Day. 10)  Miralax  Powd (Polyethylene Glycol 3350) .... As Needed 11)  Senokot 8.6 Mg Tabs (Sennosides) .... Take 1 Tablet By Mouth Three Times A Day Hold For Diarrhea 12)  Carafate 1 Gm  Tabs (Sucralfate) .Marland Kitchen.. 1 Pill By Mouth Two Times A Day 13)  Doxycycline Hyclate 100 Mg Caps (Doxycycline Hyclate) .... Take 1 Tab Twice A Day 14)  Prednisone 20 Mg Tabs (Prednisone) .... 2 By Mouth Once Daily or As Directed  Allergies: 1)  ! Cipro 2)  Morphine Sulfate (Morphine Sulfate) 3)  Sulfamethoxazole (Sulfamethoxazole) 4)  Tofranil (Imipramine Hcl) 5)  * Unna Boot  Past History:  Past Medical History: Last updated: 01/25/2010 RSD--R upper extremity  recurrent rash-undiagnosed responsive to cellcept headache neuritic dermatitis??? B12 deficiency ANA 1:160 hiatal hernia e coli sepsis--ARDA--VDRF--subendococcal MI carotid defieciency  interstitial cystitis  previous sepsis  Anemia-NOS  Past Surgical History: Last updated: 09/05/2006 Cholecystectomy Hysterectomy Oophorectomy cystoscopy  Family History: Last updated: 02/08/2010 mother---esophageal CA father deceased MI Family History Diabetes 1st degree relative---father lung CAncer---brother Family History of Colitis: Sister Family History of Uterine Cancer: Sister Family History of Ovarian Cancer: Sister Family History of Breast  Cancer: Sister Family History of Prostate Cancer: Father Family History of Heart Disease:   Social History: Last updated: 11/10/2009 Married Never Smoked Alcohol use-no Regular exercise-no 4 kids---one with AIDs  Risk Factors: Exercise: no (07/09/2007)  Risk Factors: Smoking Status: never (05/13/2010)  Physical Exam  General:  alert and well-developed.   Skin:  erythematous areas on legs and arms left hand is swollen and erythematous  excoriations on legs, arms   Impression & Recommendations:  Problem # 1:  RASH-NONVESICULAR (ICD-782.1) pruritic with excoriations.  ? 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 allergies)  Complete Medication List: 1)  Duragesic-50 50 Mcg/hr Pt72 (Fentanyl) .... Change every 72 hours as directed 2)  Cyanocobalamin 1000 Mcg/ml Soln (Cyanocobalamin) .... Inject every three months at dr Kammy Klett office 3)  Nexium 40 Mg Cpdr (Esomeprazole magnesium) .... Take 1 tablet by mouth two times a day (pharmacy-please discontinue prescription previously sent for omeprazole) 4)  Klor-con M20 20 Meq Cr-tabs (Potassium chloride crys cr) .... One two times a day 5)  Miralax Powd (Polyethylene glycol 3350) .... As needed 6)  Senokot 8.6 Mg Tabs (Sennosides) .... Take 1 tablet by mouth three times a day hold for diarrhea 7)  Carafate 1 Gm Tabs (Sucralfate) .Marland Kitchen.. 1 pill by mouth two times a day 8)  Prednisone 20 Mg Tabs (Prednisone) .... 1/2 by mouth once daily or as directed 9)  Hydroxyzine Hcl 25 Mg Tabs (Hydroxyzine hcl) .... Take 1 tablet by mouth three times a day as needed pruritis  Patient Instructions: 1)  See me monday 745 Prescriptions: HYDROXYZINE HCL 25 MG TABS (HYDROXYZINE HCL) Take 1 tablet by mouth three times a day as needed pruritis  #30 x 0   Entered and Authorized by:   [name] MD   Signed by:   [name] MD on 05/13/2010   Method used:   Electronically to        [facility] (retail)       [address]       [place], [place]  [address]       Ph: [phone]       Fax: [phone]   RxID:   [id number]

## 2010-12-29 NOTE — Progress Notes (Signed)
Summary: REQ FOR RX CLARIFICATION  Phone Note Call from Patient   Caller: Patient   [phone] Reason for Call: Talk to Nurse, Talk to Doctor Summary of Call: Pt called to adv that [facility] in [place] is advising her that they have not received any Rx for abx for this patient.... Pt request that someone call them to clarify.  Initial call taken by: Debbra Riding,  May 05, 2010 4:33 PM  Follow-up for Phone Call        called pharmacy - they had not rec'd yet - called in .   Pt aware. KIK Follow-up by: Duard Brady LPN,  May 06, 3663 5:06 PM

## 2010-12-29 NOTE — Assessment & Plan Note (Signed)
Summary: 1 MONTH ROV/NJR   Vital Signs:  Patient profile:   65 year old female Weight:      117 pounds Temp:     97.6 degrees F oral Pulse rate:   80 / minute Pulse rhythm:   regular Resp:     12 per minute BP sitting:   132 / 82  (left arm) Cuff size:   regular  Vitals Entered By: Gladis Riffle, RN (February 25, 2010 11:51 AM) CC: 1 month rov--c/o dysuria and frequency Is Patient Diabetic? No   CC:  1 month rov--c/o dysuria and frequency.  History of Present Illness:  Follow-Up Visit      [name] who presents for Follow-up visit.  The patient denies chest pain and palpitations.  The patient reports taking meds as prescribed.  When questioned about possible medication side effects, the patient notes none.  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) 7)  Hx of Lupus  (ICD-710.0) 8)  Cardiomyopathy  (ICD-425.4) 9)  Chronic Pain Syndrome  (ICD-338.4) 10)  Congestive Heart Failure, Hx of  (ICD-V12.50) 11)  Back Pain  (ICD-724.5) 12)  Chest Pain  (ICD-786.50) 13)  Hypotension, Orthostatic  (ICD-458.0) 14)  Hyperlipidemia  (ICD-272.4) 15)  Myocardial Infarction  (ICD-410.90) 16)  Cad  (ICD-414.00) 17)  CHF  (ICD-428.0) 18)  Osteoporosis  (ICD-733.00) 19)  U T I  (ICD-599.0) 20)  Anemia-nos  (ICD-285.9) 21)  Gerd  (ICD-530.81) 22)  Ana Positive  (ICD-790.99) 23)  Dermatitis  (ICD-692.9) 24)  B12 Deficiency  (ICD-266.2) 25)  Hiatal Hernia  (ICD-553.3) 26)  Common Migraine  (ICD-346.10) 27)  Fibromyalgia  (ICD-729.1) 28)  Colonic Polyps, Hx of   (ICD-V12.72)  Current Medications (verified): 1)  Duragesic-50 50 Mcg/hr Pt72 (Fentanyl) .... Change Every 72 Hours As Directed 2)  Cyanocobalamin 1000 Mcg/ml Soln (Cyanocobalamin) .... Inject Every Three Months At Dr Cato Mulligan Office 3)  Clonazepam Odt 0.5 Mg  Tbdp (Clonazepam) .... One Two Times A Day As Needed 4)  Fludrocortisone Acetate 0.1 Mg  Tabs (Fludrocortisone Acetate) .... Take 1 Tablet By Mouth Once A Day 5)  Vitamin D 1000 Unit  Caps (Cholecalciferol) .... Once Daily 6)  Nexium 40 Mg Cpdr (Esomeprazole Magnesium) .... Take 1 Tablet By Mouth Two Times A Day (Pharmacy-Please Discontinue Prescription Previously Sent For Omeprazole) 7)  Klor-Con M20 20 Meq Cr-Tabs (Potassium Chloride Crys Cr) .... One Two Times A Day 8)  Ferrous Sulfate 325 (65 Fe) Mg Tabs (Ferrous Sulfate) .... Take 1 Tablet By Mouth Two Times A Day X 3 Months 9)  Trimethoprim 100 Mg Tabs (Trimethoprim) .... Take 1 Tablet By Mouth Once A Day. Start After Completing Cipro 10)  Miralax  Powd (Polyethylene Glycol 3350) .... As Needed 11)  Senokot 8.6 Mg Tabs (Sennosides) .... Take 1 Tablet By Mouth Three Times A Day Hold For Diarrhea 12)  Carafate 1 Gm  Tabs (Sucralfate) .Marland Kitchen.. 1 Pill By Mouth Two Times A Day 13)  Nexium 40 Mg Cpdr (Esomeprazole Magnesium) .... Take 1 Tablet By Mouth Once A Day  Allergies: 1)  ! Cipro 2)  Morphine Sulfate (Morphine Sulfate) 3)  Sulfamethoxazole (Sulfamethoxazole) 4)  Tofranil (Imipramine Hcl) 5)  * Unna Boot  Past History:  Past Medical History: Last updated: 01/25/2010 RSD--R upper extremity  recurrent rash-undiagnosed responsive to cellcept headache neuritic dermatitis??? B12 deficiency ANA 1:160 hiatal hernia e coli sepsis--ARDA--VDRF--subendococcal MI carotid defieciency  interstitial cystitis  previous sepsis Anemia-NOS  Past Surgical History: Last updated: 09/05/2006 Cholecystectomy Hysterectomy Oophorectomy cystoscopy  Family History: Last updated:  02/08/2010 mother---esophageal CA father deceased MI Family History Diabetes 1st degree relative---father lung CAncer---brother Family History of Colitis: Sister Family History of Uterine Cancer: Sister Family History of Ovarian Cancer: Sister Family History of Breast Cancer: Sister Family History of Prostate Cancer: Father Family History of Heart Disease:   Social History: Last updated: 11/10/2009 Married Never Smoked Alcohol use-no Regular exercise-no 4 kids---one with AIDs  Risk Factors: Exercise: no (07/09/2007)  Risk Factors: Smoking Status: never (02/25/2010)   Impression & Recommendations:  Problem # 1:  DYSPHAGIA (ICD-787.29) she will have f/u with Gi sxs not terribly significant at this time  Problem # 2:  U T I (ICD-599.0) recurrent problem chronic suppressive therapy Her updated medication list for this problem includes:    Trimethoprim 100 Mg Tabs (Trimethoprim) .Marland Kitchen... Take 1 tablet by mouth once a day. start after completing cipro  Orders: UA Dipstick w/o Micro (automated)  (81003) T-Culture, Urine ([id number])  Problem # 3:  WEIGHT LOSS, ABNORMAL (ICD-783.21) has stabilized some continue current medications   Problem # 4:  REFLEX SYMPATHETIC DYSTROPHY (ICD-337.20) chronic problem no further eval at this time  Problem # 5:  HYPOTENSION, ORTHOSTATIC (ICD-458.0) sxs resolved  Problem # 6:  B12 DEFICIENCY (ICD-266.2) monthly injection  Complete Medication List: 1)  Duragesic-50 50 Mcg/hr Pt72 (Fentanyl) .... Change every 72 hours as directed 2)  Cyanocobalamin 1000 Mcg/ml Soln (Cyanocobalamin) .... Inject every three months at dr Shaquon Gropp office 3)  Clonazepam Odt 0.5 Mg Tbdp (Clonazepam) .... One two times a day as needed 4)  Fludrocortisone Acetate 0.1 Mg Tabs (Fludrocortisone acetate) .... Take 1 tablet by mouth once a day 5)  Vitamin D 1000 Unit Caps (Cholecalciferol) .... Once daily 6)  Nexium 40 Mg Cpdr (Esomeprazole magnesium) .... Take 1  tablet by mouth two times a day (pharmacy-please discontinue prescription previously sent for omeprazole) 7)  Klor-con M20 20 Meq Cr-tabs (Potassium chloride crys cr) .... One two times a day 8)  Ferrous Sulfate 325 (65 Fe) Mg Tabs (Ferrous sulfate) .... Take 1 tablet by mouth two times a day x 3 months 9)  Trimethoprim 100 Mg Tabs (Trimethoprim) .... Take 1 tablet by mouth once a day. start after completing cipro 10)  Miralax Powd (Polyethylene glycol 3350) .... As needed 11)  Senokot 8.6 Mg Tabs (Sennosides) .... Take 1 tablet by mouth three times a day hold for diarrhea 12)  Carafate 1 Gm Tabs (Sucralfate) .Marland Kitchen.. 1 pill by mouth two times a day 13)  Nexium 40 Mg Cpdr (Esomeprazole magnesium) .... Take 1 tablet by mouth once a day  Patient Instructions: 1)  see me 6 weeks Prescriptions: DURAGESIC-50 50 MCG/HR PT72 (FENTANYL) change every 72 hours as directed  #10 x 0   Entered and Authorized by:   [name] MD   Signed by:   [name] MD on 02/25/2010   Method used:   Print then Give to Patient   RxID:   [id number]   Laboratory Results   Urine Tests  Date/Time Recieved: February 25, 2010 12:01 PM  Date/Time  Reported: February 25, 2010 12:01 PM   Routine Urinalysis   Color: yellow Appearance: Clear Glucose: negative   (Normal Range: Negative) Bilirubin: negative   (Normal Range: Negative) Ketone: negative   (Normal Range: Negative) Spec. Gravity: 1.010   (Normal Range: 1.003-1.035) Blood: negative   (Normal Range: Negative) pH: 7.0   (Normal Range: 5.0-8.0) Protein: negative   (Normal Range: Negative) Urobilinogen: negative   (Normal Range: 0-1) Nitrite: negative   (Normal Range: Negative) Leukocyte Esterace: trace   (Normal Range: Negative)    Comments: Wynona Canes, CMA  February 25, 2010 12:01 PM

## 2010-12-29 NOTE — Assessment & Plan Note (Signed)
Summary: post hos fup/njr   Vital Signs:  Patient profile:   65 year old female Weight:      119 pounds Temp:     98 degrees F Pulse rate:   84 / minute Resp:     12 per minute BP sitting:   138 / 80  (left arm)  Vitals Entered By: Gladis Riffle, RN (December 27, 2009 11:14 AM) CC: hospital follow up   CC:  hospital follow up.  History of Present Illness:  Follow-Up Visit      [name] who presents for Follow-up visit.  The patient denies chest pain, palpitations, dizziness, and syncope.  Since the last visit the patient notes no new problems or concerns.  The patient reports taking meds as prescribed.  When questioned about possible medication side effects, the patient notes none.   she has not had any chest pain, syncope or SOB\par reviewed hospital records and labs: note anemia  All other systems reviewed and were negative   Preventive Screening-Counseling & Management  Alcohol-Tobacco     Smoking Status: never  Comments: takes klor-con Take 1 tablet by mouth once a day   Current Problems (verified): 1)  Osteoporosis  (ICD-733.00) 2)  U T I  (ICD-599.0) 3)  Hypotension, Orthostatic  (ICD-458.0) 4)  Hyperlipidemia  (ICD-272.4) 5)  Anemia-nos  (ICD-285.9) 6)  Gerd  (ICD-530.81) 7)  Ana Positive  (ICD-790.99) 8)  Dermatitis  (ICD-692.9) 9)  B12 Deficiency  (ICD-266.2) 10)  Hiatal Hernia  (ICD-553.3) 11)  Common Migraine  (ICD-346.10) 12)  Fibromyalgia  (ICD-729.1) 13)  Colonic Polyps, Hx of  (ICD-V12.72)  Current Medications (verified): 1)  Duragesic-50 50 Mcg/hr Pt72 (Fentanyl) .... Change Every 72 Hours As Directed 2)  Cyanocobalamin 1000 Mcg/ml Soln (Cyanocobalamin) .... Inject Every Three Months At Dr Cato Mulligan Office 3)  Clonazepam Odt 0.5 Mg  Tbdp (Clonazepam) .... One Two Times A Day As Needed 4)  Famotidine 20 Mg  Tabs (Famotidine) .... Take 1 Tab By Mouth At Bedtime 5)  Fludrocortisone Acetate 0.1 Mg  Tabs (Fludrocortisone Acetate) .... Take  1 Tablet By Mouth Once A Day 6)  Vitamin D 1000 Unit  Caps (Cholecalciferol) .... Once Daily 7)  Omeprazole 20 Mg  Cpdr (Omeprazole) .... Take 1 Capsule By Mouth Once A Day 8)  Nystatin-Triamcinolone 100000-0.1 Unit/gm-% Crea (Nystatin-Triamcinolone) .... Use Two Times A Day To Affected Areas For 10 Days 9)  Klor-Con M20 20 Meq Cr-Tabs (Potassium Chloride Crys Cr) .... One Two Times A Day 10)  Ferrous Sulfate 325 (65 Fe) Mg Tabs (Ferrous Sulfate) .... Take 1 Tablet By Mouth Two Times A Day X 3 Months 11)  Colace 100 Mg Caps (Docusate Sodium) .... As Needed Constipation 12)  Elmiron 100 Mg Caps (P[place]os[place] Polysulfate Sodium) .... Two Two Times A Day 13)  Alendronate Sodium 70 Mg  Tabs (Alendronate Sodium) .Marland Kitchen.. 1 By Mouth Q Week . Take With 8 Ounces of Water On and Empty Stomach  Allergies: 1)  Morphine Sulfate (Morphine Sulfate) 2)  Sulfamethoxazole (Sulfamethoxazole) 3)  Tofranil (Imipramine Hcl) 4)  * Unna Boot  Comments:  Nurse/Medical Assistant: hospital follow up, states pcp is to decide if needs stress test--c/o weakness  The patient's medications and allergies were reviewed with the patient and were updated in the Medication and Allergy Lists. Gladis Riffle, RN (December 27, 2009 11:18 AM)  Past History:  Past Medical History: Last updated: 09/25/2007 RSD--R upper extremity fibromyalgia recurrent rash-undiagnosed responsive to cellcept  headache neuritic dermatitis??? B12 deficiency ANA 1:160 hiatal hernia e coli sepsis--ARDA--VDRF--subendococcal MI carotid defieciency Colonic polyps, hx of GERD Anemia-NOS  Past Surgical History: Last updated: 09/05/2006 Cholecystectomy Hysterectomy Oophorectomy cystoscopy  Family History: Last updated: 08/23/2009 mother---esophageal CA father deceased [place] Family History Diabetes 1st degree relative---father lung CAncer---brother  Social History: Last updated: 11/10/2009 Married Never Smoked Alcohol use-no Regular  exercise-no 4 kids---one with AIDs  Risk Factors: Exercise: no (07/09/2007)  Risk Factors: Smoking Status: never (12/27/2009)  Review of Systems       she describes persistent indigestion despite PPI and H2 blocker-- sherequires a lot of TUMS chronic fatigue - unchanged occasional dizziness  All other systems reviewed and were negative   Physical Exam  General:  Well-developed,well-nourished,in no acute distress; alert,appropriate and cooperative throughout examination Head:  normocephalic and atraumatic.   Eyes:  pupils equal and pupils round.   Ears:  R ear normal and L ear normal.   Neck:  No deformities, masses, or tenderness noted. Chest Wall:  No deformities, masses, or tenderness noted. Lungs:  normal respiratory effort and no intercostal retractions.   Heart:  normal rate and regular rhythm.   Abdomen:  soft and non-tender.   Msk:  No deformity or scoliosis noted of thoracic or lumbar spine.   Pulses:  R radial normal and L radial normal.   Neurologic:  cranial nerves II-XII intact and gait normal.   Skin:  turgor normal and color normal.   Cervical Nodes:  no anterior cervical adenopathy and no posterior cervical adenopathy.   Psych:  normally interactive and good eye contact.     Impression & Recommendations:  Problem # 1:  U T I (ICD-599.0) recurrent problem  Problem # 2:  HYPERLIPIDEMIA (ICD-272.4) no meds Labs Reviewed: SGOT: 15 (11/10/2009)   SGPT: 12 (11/10/2009)   HDL:49.4 (12/19/2007)  LDL:111 (12/19/2007)  Chol:179 (12/19/2007)  Trig:94 (12/19/2007)  Problem # 3:  HYPOTENSION, ORTHOSTATIC (ICD-458.0)  no recurrent sxs recently but she continues to have intermittent sxs (very dizzy) contrinue fludrocortisone refer CV inlight of recent hospitalization  Orders: Cardiology Referral (Cardiology) TLB-BMP (Basic Metabolic Panel-BMET) (80048-METABOL) TLB-TSH (Thyroid Stimulating Hormone) (84443-TSH)  Problem # 4:  GERD (ICD-530.81)  she has  significant sxs despite PPI and H2 blocker refer GI Her updated medication list for this problem includes:    Famotidine 20 Mg Tabs (Famotidine) .Marland Kitchen... Take 1 tab by mouth at bedtime    Omeprazole 20 Mg Cpdr (Omeprazole) .Marland Kitchen... Take 1 capsule by mouth once a day  Orders: Gastroenterology Referral (GI)  Complete Medication List: 1)  Duragesic-50 50 Mcg/hr Pt72 (Fentanyl) .... Change every 72 hours as directed 2)  Cyanocobalamin 1000 Mcg/ml Soln (Cyanocobalamin) .... Inject every three months at dr Sitlaly Gudiel office 3)  Clonazepam Odt 0.5 Mg Tbdp (Clonazepam) .... One two times a day as needed 4)  Famotidine 20 Mg Tabs (Famotidine) .... Take 1 tab by mouth at bedtime 5)  Fludrocortisone Acetate 0.1 Mg Tabs (Fludrocortisone acetate) .... Take 1 tablet by mouth once a day 6)  Vitamin D 1000 Unit Caps (Cholecalciferol) .... Once daily 7)  Omeprazole 20 Mg Cpdr (Omeprazole) .... Take 1 capsule by mouth once a day 8)  Klor-con M20 20 Meq Cr-tabs (Potassium chloride crys cr) .... One two times a day 9)  Ferrous Sulfate 325 (65 Fe) Mg Tabs (Ferrous sulfate) .... Take 1 tablet by mouth two times a day x 3 months 10)  Alendronate Sodium 70 Mg Tabs (Alendronate sodium) .Marland Kitchen.. 1 by mouth q  week . take with 8 ounces of water on and empty stomach  Other Orders: UA Dipstick w/o Micro (automated)  (81003) Venipuncture (16109) TLB-CBC Platelet - w/Differential (85025-CBCD)  Patient Instructions: 1)  Please schedule a follow-up appointment in 1 month. Prescriptions: DURAGESIC-50 50 MCG/HR PT72 (FENTANYL) change every 72 hours as directed  #10 x 0   Entered and Authorized by:   [name] MD   Signed by:   [name] MD on 12/27/2009   Method used:   Print then Give to Patient   RxID:   [id number]   Appended Document: post hos fup/njr  Laboratory Results   Urine Tests    Routine Urinalysis   Color: yellow Appearance: Clear Glucose: negative   (Normal Range: Negative) Bilirubin: negative    (Normal Range: Negative) Ketone: negative   (Normal Range: Negative) Spec. Gravity: 1.015   (Normal Range: 1.003-1.035) Blood: 1+   (Normal Range: Negative) pH: 7.0   (Normal Range: 5.0-8.0) Protein: negative   (Normal Range: Negative) Urobilinogen: 0.2   (Normal Range: 0-1) Nitrite: negative   (Normal Range: Negative) Leukocyte Esterace: negative   (Normal Range: Negative)    Comments: [name]  December 27, 2009 12:07 PM         Medication Administration  Injection # 1:    Medication: Vit B12 1000 mcg    Diagnosis: B12 DEFICIENCY (ICD-266.2)    Route: IM    Site: L deltoid    Exp Date: 02/26/2011    Lot #: [id number]    Mfr: [employer]    Patient tolerated injection without complications    Given by: Gladis Riffle, RN (December 27, 2009 12:10 PM)  Orders Added: 1)  T-Culture, Urine [[id number]] 2)  Vit B12 1000 mcg [J3420] 3)  Admin of Therapeutic Inj  intramuscular or subcutaneous [96295]

## 2010-12-29 NOTE — Assessment & Plan Note (Signed)
Summary: 1 month fup//ccm//Shingle Shot//alp   Vital Signs:  Patient profile:   65 year old female Weight:      129 pounds Temp:     98.3 degrees F oral BP sitting:   104 / 66  (left arm) Cuff size:   regular  Vitals Entered By: Alfred Levins, CMA (October 12, 2010 11:33 AM) CC: 1 mth f/u   CC:  1 mth f/u.  History of Present Illness:  Follow-Up Visit      [name] who presents for Follow-up visit.  The patient denies chest pain and palpitations.  Since the last visit the patient notes no new problems or concerns.  The patient reports taking meds as prescribed.  When questioned about possible medication side effects, the patient notes none.    review of systems: Patient feels somewhat better. Rashes results. She has chronic fatigue. She has chronic muscle aches and pains. No other complaints in a complete review of systems.  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 Change Every 72 Hours As Directed 2)  Cyanocobalamin 1000 Mcg/ml Soln (Cyanocobalamin) .... Inject Every Three Months At Dr Cato Mulligan Office 3)  Omeprazole 20 Mg Cpdr (Omeprazole) .... One By Mouth Daily 4)  Miralax  Powd (Polyethylene Glycol 3350) .... As Needed 5)  Senokot 8.6 Mg Tabs (Sennosides) .... Take 1 Tablet By Mouth Three Times A Day Hold For Diarrhea 6)  Triamcinolone Acetonide 0.5 % Crea (Triamcinolone  Acetonide) .... Apply Bid To Affected Area 7)  Cellcept 500 Mg Tabs (Mycophenolate Mofetil) .... Take 1/2 Tablet By Mouth Once A Day 8)  Klonopin 0.5 Mg Tabs (Clonazepam) .... Two Times A Day  Allergies (verified): 1)  Morphine Sulfate (Morphine Sulfate) 2)  Sulfamethoxazole (Sulfamethoxazole) 3)  Tofranil (Imipramine Hcl) 4)  * Unna Boot  Past History:  Past Medical History: Last updated: 01/25/2010 RSD--R upper extremity  recurrent rash-undiagnosed responsive to cellcept headache neuritic dermatitis??? B12 deficiency ANA 1:160 hiatal hernia e coli sepsis--ARDA--VDRF--subendococcal MI carotid defieciency  interstitial cystitis  previous sepsis Anemia-NOS  Past Surgical History: Last updated: 09/05/2006 Cholecystectomy Hysterectomy Oophorectomy cystoscopy  Family History: Last updated: 02/08/2010 mother---esophageal CA father deceased [place] Family History Diabetes 1st degree relative---father lung CAncer---brother Family History of Colitis: Sister Family History of Uterine Cancer: Sister Family History of Ovarian Cancer: Sister Family History of Breast Cancer: Sister Family History of Prostate Cancer: Father Family History of Heart Disease:   Social History: Last updated: 11/10/2009 Married Never Smoked Alcohol use-no Regular exercise-no 4 kids---one with AIDs  Risk Factors: Exercise: no (07/09/2007)  Risk Factors: Smoking Status: never (07/20/2010)  Physical Exam  General:  well-developed female in no acute distress. She appears somewhat pale. HEENT exam atraumatic, normocephalic. Neck supple. Chest clear to auscultation cardiac exam S1-S2 are regular. Abdominal exam thin, to bowel sounds, soft. Extremities no clubbing cyanosis or edema. Neurologic exam she is alert with a normal gait.  Impression & Recommendations:  Problem # 1:  RASH-NONVESICULAR (ICD-782.1) much iimproved low dose cellcept Her updated medication list for this problem  includes:    Triamcinolone Acetonide 0.5 % Crea (Triamcinolone acetonide) .Marland Kitchen... Apply bid to affected area  Problem # 2:  HYPOTENSION, ORTHOSTATIC (ICD-458.0) clinically doing well  Problem # 3:  U T I (ICD-599.0) recurrent no meds  Problem # 4:  FIBROMYALGIA (ICD-729.1) doing well on meds Her updated medication list for this problem includes:    Duragesic-50 50 Mcg/hr Pt72 (Fentanyl) .Marland Kitchen... Change every 72 hours as directed  Complete Medication List: 1)  Duragesic-50 50 Mcg/hr Pt72 (Fentanyl) .... Change every 72 hours as directed 2)  Cyanocobalamin 1000 Mcg/ml Soln (Cyanocobalamin) .... Inject every three months at dr swords office 3)  Omeprazole 20 Mg Cpdr (Omeprazole) .... One by mouth daily 4)  Miralax Powd (Polyethylene glycol 3350) .... As needed 5)  Senokot 8.6 Mg Tabs (Sennosides) .... Take 1 tablet by mouth three times a day hold for diarrhea 6)  Triamcinolone Acetonide 0.5 % Crea (Triamcinolone acetonide) .... Apply bid to affected area 7)  Cellcept 500 Mg Tabs (Mycophenolate mofetil) .... Take 1/2 tablet by mouth once a day 8)  Klonopin 0.5 Mg Tabs (Clonazepam) .... Two times a day  Other Orders: Zoster (Shingles) Vaccine Live [phone]) Admin 1st Vaccine (98119)  Patient Instructions: 1)  Please schedule a follow-up appointment in 1 month. Prescriptions: DURAGESIC-50 50 MCG/HR PT72 (FENTANYL) change every 72 hours as directed  #10 x 0   Entered by:   Alfred Levins, CMA   Authorized by:   [name] MD   Signed by:   Alfred Levins, CMA on 10/12/2010   Method used:   Print then Give to Patient   RxID:   [id number]    Orders Added: 1)  Zoster (Shingles) Vaccine Live [84696] 2)  Admin 1st Vaccine [90471] 3)  Est. Patient Level IV [29528]   Immunizations Administered:  Zostavax # 1:    Vaccine Type: Zostavax    Site: left arm    Mfr: Merck    Dose: 65mL    Route: Dickinson    Given by: Alfred Levins, CMA    Exp. Date: 07/15/2011    Lot #:  [id number]   Immunizations Administered:  Zostavax # 1:    Vaccine Type: Zostavax    Site: left arm    Mfr: Merck    Dose: 65mL    Route: [place]    Given by: Alfred Levins, CMA    Exp. Date: 07/15/2011    Lot #: [id number]

## 2010-12-29 NOTE — Assessment & Plan Note (Signed)
Summary: FU PER DR SW/PS   Vital Signs:  Patient profile:   65 year old female Weight:      126 pounds BMI:     20.41 Temp:     98.5 degrees F oral Pulse rhythm:   regular BP sitting:   96 / 60  Vitals Entered By: Lynann Beaver CMA (September 15, 2010 9:50 AM) CC: rov Is Patient Diabetic? No Pain Assessment Patient in pain? no        CC:  rov.  Current Medications (verified): 1)  Duragesic-50 50 Mcg/hr Pt72 (Fentanyl) .... Change Every 72 Hours As Directed 2)  Cyanocobalamin 1000 Mcg/ml Soln (Cyanocobalamin) .... Inject Every Three Months At Dr Cato Mulligan Office 3)  Omeprazole 20 Mg Cpdr (Omeprazole) .... One By Mouth Daily 4)  Miralax  Powd (Polyethylene Glycol 3350) .... As Needed 5)  Senokot 8.6 Mg Tabs (Sennosides) .... Take 1 Tablet By Mouth Three Times A Day Hold For Diarrhea 6)  Triamcinolone Acetonide 0.5 % Crea (Triamcinolone Acetonide) .... Apply Bid To Affected Area 7)  Cellcept 500 Mg Tabs (Mycophenolate Mofetil) .... Take 1 Tablet By Mouth Once A Day 8)  Klonopin 0.5 Mg Tabs (Clonazepam) .... Two Times A Day  Allergies (verified): 1)  Morphine Sulfate (Morphine Sulfate) 2)  Sulfamethoxazole (Sulfamethoxazole) 3)  Tofranil (Imipramine Hcl) 4)  * Unna Boot  Physical Exam  General:  chronically ill-appearing female in no acute distress. Mildly pale. Neck is supple. Cardiac exam S1-S2 are regular. Dermatologic exam she has excoriations over the lower extremities bilaterally. No lower extremity edema.   Impression & Recommendations:  Problem # 1:  RASH-NONVESICULAR (ICD-782.1) improved decrease cellcept see me next weak Her updated medication list for this problem includes:    Triamcinolone Acetonide 0.5 % Crea (Triamcinolone acetonide) .Marland Kitchen... Apply bid to affected area generalized weaknes ? cause reviewed ED records note mild anemia---will follow  Complete Medication List: 1)  Duragesic-50 50 Mcg/hr Pt72 (Fentanyl) .... Change every 72 hours as  directed 2)  Cyanocobalamin 1000 Mcg/ml Soln (Cyanocobalamin) .... Inject every three months at dr swords office 3)  Omeprazole 20 Mg Cpdr (Omeprazole) .... One by mouth daily 4)  Miralax Powd (Polyethylene glycol 3350) .... As needed 5)  Senokot 8.6 Mg Tabs (Sennosides) .... Take 1 tablet by mouth three times a day hold for diarrhea 6)  Triamcinolone Acetonide 0.5 % Crea (Triamcinolone acetonide) .... Apply bid to affected area 7)  Cellcept 500 Mg Tabs (Mycophenolate mofetil) .... Take 1/2 tablet by mouth once a day 8)  Klonopin 0.5 Mg Tabs (Clonazepam) .... Two times a day  Patient Instructions: 1)  See me next week   Orders Added: 1)  Est. Patient Level III [16109]

## 2010-12-29 NOTE — Discharge Summary (Signed)
Summary: Nausea, Vomiting, Diarrhea  NAME:  [name], [name]                 ACCOUNT NO.:  [id number]      MEDICAL RECORD NO.:  [id number]          PATIENT TYPE:  INP      LOCATION:  6710                         FACILITY:  [facility]      PHYSICIAN:  Bruce H. Swords, MD    DATE OF BIRTH:  March 09, 1946      DATE OF ADMISSION:  02/17/2008   DATE OF DISCHARGE:  02/28/2008                                  DISCHARGE SUMMARY      DISCHARGE DIAGNOSES:   1. 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   2. Nausea, vomiting, and diarrhea, resolved, likely secondary to viral       gastroenteritis.   3. RC.   4. Fibromyalgia syndrome.   5. Depression.      HISTORY OF PRESENT ILLNESS:  [name] is a 65 year old white female   admitted with chief complaint of syncope.  She was recently discharged   from the hospital. Reported that she was not making any progress at home   and had 2 episodes of syncope on the day of admission upon standing and   one more in the emergency department.  She was admitted for further   evaluation and treatment.      HOSPITAL COURSE:   1. Orthostasis with recurrent syncope.  The patient was admitted.  She       was started on Florinef and was continued with TED hose.  While her       blood pressure remained fairly stable from lying to standing, her       heart rate went up significantly with standing.  This did not       improve significantly with the addition of Florinef.  She was seen       by physical therapy and occupational therapy and it was felt that       she was stable to be discharged home if she has 24-hour supervision       and do not stand up alone.  At this time, the plan is for the       patient to be discharged to home with close outpatient followup       with her primary care provider and that she will likely be referred       to [facility] as an outpatient for additional workup.      The patient did develop some nausea,  vomiting, and diarrhea briefly   during this admission.  She also had some mild hypokalemia, which was   treated with oral potassium supplementation.      DISCHARGE MEDICATIONS:   1. Monthly B12 injections as before.   2. Prilosec 20 mg p.o. daily.   3. Florinef 0.2 mg p.o. daily.   4. Mucinex 600 mg p.o. b.i.d.   5. Duragesic patch 25 mcg to be changed every 72 hours.   6. Pepcid 20 mg p.o. nightly.   7. Triamcinolone cream as needed for rash.   8. Klonopin 0.5  mg p.o. b.i.d. as needed.      DISPOSITION:  The patient is discharged to home.      FOLLOWUP:  The patient is to follow up with Dr. [name] on April 8   at 11:30 a.m.      PERTINENT LABORATORY DATA AT TIME OF DISCHARGE:  Hemoglobin 10.6, BUN   13, creatinine 0.71, potassium 3.2 prior to repletion, and sodium 139.               Sandford Craze, NP               Tara Mole. Swords, MD   Electronically Signed         MO/MEDQ  D:  02/28/2008  T:  02/29/2008  Job:  [id number]

## 2010-12-29 NOTE — Op Note (Signed)
Summary: Cholecystectomy w/cholangiogram   NAMEELLISYN, ICENHOWER                 ACCOUNT NO.:  [id number]   MEDICAL RECORD NO.:  [id number]          PATIENT TYPE:  INP   LOCATION:  1406                         [facility]ACILITY[facility]  [facility]   PHYSICIAN:  Gabrielle Dare. Janee Morn, M.D.DATE OF BIRTH:  [birth year]   DATE OF ADMISSION:  09/21/2005  DATE OF DISCHARGE:                                 DISCHARGE SUMMARY   DISCHARGE DIAGNOSES:  1.  Status post laparoscopic cholecystectomy with intraoperative      cholangiogram.  2.  Hypotension.   HISTORY OF PRESENT ILLNESS:  The patient is a 65 year old female with a  history of multiple medical problems including migraine headaches, anxiety  disorder, reflex sympathetic dystrophy, and fibromyalgia who presented for  elective cholecystectomy for symptomatic cholelithiasis. She underwent  laparoscopic cholecystectomy with intraoperative cholangiogram and was  admitted for postoperative care.   HOSPITAL COURSE:  The patient tolerated an uncomplicated cholecystectomy.  Cholangiogram was normal. Initially postoperatively, she remained stable  with good pain control. The morning of postoperative day #1 she developed  some chest tightness and hypotension. She was volume resuscitated and her  hypotension was initially not responsive to that. She was transferred to the  intensive care unit. Serial hemoglobin checks ruled out any active bleeding.  Cardiac enzymes were negative. I placed a central venous access and pressure  was supported with dopamine as she continued her workup. Mental status  remained intact. Cardiology consult was obtained and we continued volume  resuscitation, both with crystalloid and 1 unit of packed red blood cells.  The CT scan of the chest was obtained and pulmonary embolus was ruled out.  She had no signs or symptoms of sepsis. Gradually, over the next 2 days the  dopamine was weaned off, she maintained her pressure, usually in the 90s  systolic. She was followed by [facility] internal medicine service as well very  closely during this postoperative period. Once her hemodynamics stabilized,  her central line was removed, physical therapy and occupational therapy  consultations were obtained. She mobilized gradually and is doing very well  today. She is ready for discharge in stable condition.   DISCHARGE DIET:  Low fat.   DISCHARGE INSTRUCTIONS:  No lifting. She is also to use a rolling walker  which Dr. Felicity Coyer has ordered for her.   DISCHARGE MEDICATIONS:  These include Percocet 5/325 one to two p.o. q.6h.  p.r.n. pain.   She is also to continue her list of home medication as previously. This  includes:  1.  Duragesic patch 100 mcg change q.72h.  2.  Enablex 15 mg p.o. q.a.m.  3.  Her CellCept was increased per Dr. Felicity Coyer to 500 mg b.i.d. until she      sees Dr. Cato Mulligan.  4.  Xanax 0.25 mg p.o. q.8h. p.r.n. anxiety.  5.  Zelnorm 6 mg p.o. q.a.m.  6.  Prilosec 20 mg p.o. q.a.m.  7.  Imitrex and Excedrin are p.r.n.   FOLLOW-UP:  Is with myself in 2 weeks and with Dr. Cato Mulligan on October 02, 2005.      Laurell Josephs  E. Janee Morn, M.D.  Electronically Signed     BET/MEDQ  D:  09/28/2005  T:  09/28/2005  Job:  [id number]   cc:   [facility]   Please FAX copy to office [name] M.D. [facility]

## 2010-12-29 NOTE — Assessment & Plan Note (Signed)
Summary: Gastroenterology  Terianna  MR#:  [id number] Page #  [facility]   GASTROENTEROLOGY   NEW PATIENT WORKUP NOTE  NAME:  Anzley, Dibbern   OFFICE NO:  [id number]  DATE:  03/09/03  DOB: [birth year]   HISTORY OF PRESENT ILLNESS:  The patient referred by Dr. Cato Mulligan.  She has been having pain in the bottom of the stomach and lower abdomen.  Everything she eats hurts.  The patient denies any fever or chills.  No blood in the stools.  No change in bowel movements.  Mostly constipation.  Does get bloated and swelling at night.  The patient was put on Protonix with not much relief.  She has complained of some mild dysphagia, especially to hamburgers and chicken, and heartburn.    The patient has known lupus and is treated for this.  The patient does note having an upper endoscopy and colonoscopic examination in the past.  She has diagnosis of fibromyalgia and reflex sympathetic dystrophy and a skin rash.  She said she was supposed to come sooner but the skin was worse and she seemed to be embarrassed by this.    FAMILY HISTORY:  Reveals she has a sister with uterine cancer, two sisters with ovarian cancer.  Family members with diabetes, heart disease.  Father had prostate cancer.  One third sister had breast cancer and her mother had throat cancer.    SOCIAL HISTORY:  The patient is married and has four children and is disabled because of her medical problems.    REVIEW OF SYSTEMS:  Essentially noncontributory.  PHYSICAL EXAMINATION:  She weighs 167 pounds.  Blood pressure 110/70.  Pulse 62 and regular.  Neck was negative.  Oropharynx was negative.  Chest was clear.  Heart revealed a regular rhythm without significant murmur.  Abdomen was soft, no mass or organomegaly although it was tender on palpation of the left and right lower quadrants.  Rectal was deferred.  Skin revealed a rash on her hands and legs but it was not severe.  IMPRESSION: 1.  Lupus. 2.  Fibromyalgia. 3.  Lower abdominal pain,  probably irritable bowel syndrome. 4.  Gastroesophageal reflux disease with dysphagia.  RECOMMENDATIONS:  Get a barium swallow and a CT of the abdomen and pelvis.  Schedule the patient also for an endoscopy with dilatation and a colonoscopic examination.    Ulyess Mort, M.D.  [id number] cc:  Dr. [name] D:  03/09/03; T:  ; Job [phone]

## 2010-12-29 NOTE — Assessment & Plan Note (Signed)
Summary: discuss CT results/dm   Vital Signs:  Patient profile:   65 year old female Weight:      120 pounds Temp:     98.0 degrees F oral BP sitting:   120 / 78  (left arm) Cuff size:   regular  Vitals Entered By: Kern Reap CMA Duncan Dull) (Apr 15, 2010 8:51 AM) CC: follow-up CT results   CC:  follow-up CT results.  History of Present Illness:  Follow-Up Visit      [name] who presents for Follow-up visit.  The patient denies chest pain and palpitations.  Since the last visit the patient notes no new problems or concerns.  The patient reports taking meds as prescribed.  When questioned about possible medication side effects, the patient notes none.  discussed events around CT scan with Dr. Graciela Husbands.   All other systems reviewed and were negative   Current Problems (verified): 1)  Abdominal Pain  (ICD-789.00) 2)  Gerd  (ICD-530.81) 3)  Gastritis, Chronic  (ICD-535.10) 4)  Reflex Sympathetic Dystrophy  (ICD-337.20) 5)  Hx of Lupus  (ICD-710.0) 6)  Cardiomyopathy  (ICD-425.4) 7)  Chronic Pain Syndrome  (ICD-338.4) 8)  Congestive Heart Failure, Hx of  (ICD-V12.50) 9)  Hypotension, Orthostatic  (ICD-458.0) 10)  Hyperlipidemia  (ICD-272.4) 11)  Myocardial Infarction  (ICD-410.90) 12)  Osteoporosis  (ICD-733.00) 13)  U T I  (ICD-599.0) 14)  Anemia-nos  (ICD-285.9) 15)  Ana Positive  (ICD-790.99) 16)  Dermatitis  (ICD-692.9) 17)  B12 Deficiency  (ICD-266.2) 18)  Hiatal Hernia  (ICD-553.3) 19)  Fibromyalgia  (ICD-729.1) 20)  Colonic Polyps, Hx of  (ICD-V12.72)  Current Medications (verified): 1)  Duragesic-50 50 Mcg/hr Pt72 (Fentanyl) .... Change Every 72 Hours As Directed 2)  Cyanocobalamin 1000 Mcg/ml Soln (Cyanocobalamin) .... Inject Every Three Months At Dr Cato Mulligan Office 3)  Clonazepam Odt 0.5 Mg  Tbdp (Clonazepam) .... One Two Times A Day As Needed 4)  Fludrocortisone Acetate 0.1 Mg  Tabs (Fludrocortisone Acetate) .... Take 1 Tablet By Mouth Once A  Day 5)  Vitamin D 1000 Unit  Caps (Cholecalciferol) .... Once Daily 6)  Nexium 40 Mg Cpdr (Esomeprazole Magnesium) .... Take 1 Tablet By Mouth Two Times A Day (Pharmacy-Please Discontinue Prescription Previously Sent For Omeprazole) 7)  Klor-Con M20 20 Meq Cr-Tabs (Potassium Chloride Crys Cr) .... One Two Times A Day 8)  Ferrous Sulfate 325 (65 Fe) Mg Tabs (Ferrous Sulfate) .... Take 1 Tablet By Mouth Two Times A Day X 3 Months 9)  Trimethoprim 100 Mg Tabs (Trimethoprim) .... Take 1 Tablet By Mouth Once A Day. 10)  Miralax  Powd (Polyethylene Glycol 3350) .... As Needed 11)  Senokot 8.6 Mg Tabs (Sennosides) .... Take 1 Tablet By Mouth Three Times A Day Hold For Diarrhea 12)  Carafate 1 Gm  Tabs (Sucralfate) .Marland Kitchen.. 1 Pill By Mouth Two Times A Day  Allergies (verified): 1)  ! Cipro 2)  Morphine Sulfate (Morphine Sulfate) 3)  Sulfamethoxazole (Sulfamethoxazole) 4)  Tofranil (Imipramine Hcl) 5)  * Unna Boot  Past History:  Past Medical History: Last updated: 01/25/2010 RSD--R upper extremity  recurrent rash-undiagnosed responsive to cellcept headache neuritic dermatitis??? B12 deficiency ANA 1:160 hiatal hernia e coli sepsis--ARDA--VDRF--subendococcal MI carotid defieciency  interstitial cystitis  previous sepsis Anemia-NOS  Past Surgical History: Last updated: 09/05/2006 Cholecystectomy Hysterectomy Oophorectomy cystoscopy  Family History: Last updated: 02/08/2010 mother---esophageal CA father deceased MI Family History Diabetes 1st degree relative---father lung CAncer---brother Family History of Colitis: Sister  Family History of Uterine Cancer: Sister Family History of Ovarian Cancer: Sister Family History of Breast Cancer: Sister Family History of Prostate Cancer: Father Family History of Heart Disease:   Social History: Last updated: 11/10/2009 Married Never Smoked Alcohol use-no Regular exercise-no 4 kids---one with AIDs  Risk Factors: Exercise: no  (07/09/2007)  Risk Factors: Smoking Status: never (04/08/2010)  Review of Systems       All other systems reviewed and were negative except chronic fatigue, chronic pain  Physical Exam  General:  alert and well-developed.   Head:  normocephalic and atraumatic.   Eyes:  pupils equal and pupils round.   Ears:  R ear normal and L ear normal.   Neck:  No deformities, masses, or tenderness noted. Chest Wall:  No deformities, masses, or tenderness noted. Lungs:  normal respiratory effort and no intercostal retractions.   Heart:  normal rate and regular rhythm.   Abdomen:  soft and non-tender.   Msk:  normal ROM and no joint tenderness.   Neurologic:  cranial nerves II-XII intact and gait normal.     Impression & Recommendations:  Problem # 1:  ABDOMINAL PAIN (ICD-789.00)  ? rectovaginal fistula will check barium enema  Orders: Radiology Referral (Radiology)  Problem # 2:  HYPOTENSION, ORTHOSTATIC (ICD-458.0) she has not had recurrent syncope  Problem # 3:  NONSPECIFIC ABN FNDNG RAD & OTH EXM SKULL & HEAD (ICD-793.0)  likely incidental finding no primary malignancy known check  ESR and SPEP  Orders: T-Immunofixation Electrophoresis, Serum  ([id number])  Complete Medication List: 1)  Duragesic-50 50 Mcg/hr Pt72 (Fentanyl) .... Change every 72 hours as directed 2)  Cyanocobalamin 1000 Mcg/ml Soln (Cyanocobalamin) .... Inject every three months at dr Amera Banos office 3)  Clonazepam Odt 0.5 Mg Tbdp (Clonazepam) .... One two times a day as needed 4)  Fludrocortisone Acetate 0.1 Mg Tabs (Fludrocortisone acetate) .... Take 1 tablet by mouth once a day 5)  Vitamin D 1000 Unit Caps (Cholecalciferol) .... Once daily 6)  Nexium 40 Mg Cpdr (Esomeprazole magnesium) .... Take 1 tablet by mouth two times a day (pharmacy-please discontinue prescription previously sent for omeprazole) 7)  Klor-con M20 20 Meq Cr-tabs (Potassium chloride crys cr) .... One two times a day 8)  Ferrous  Sulfate 325 (65 Fe) Mg Tabs (Ferrous sulfate) .... Take 1 tablet by mouth two times a day x 3 months 9)  Trimethoprim 100 Mg Tabs (Trimethoprim) .... Take 1 tablet by mouth once a day. 10)  Miralax Powd (Polyethylene glycol 3350) .... As needed 11)  Senokot 8.6 Mg Tabs (Sennosides) .... Take 1 tablet by mouth three times a day hold for diarrhea 12)  Carafate 1 Gm Tabs (Sucralfate) .Marland Kitchen.. 1 pill by mouth two times a day  Appended Document: discuss CT results/dm reviewed CT abdomen and head

## 2010-12-29 NOTE — Assessment & Plan Note (Signed)
Summary: 1 month rov/njr   Vital Signs:  Patient profile:   65 year old female Weight:      125.5 pounds Temp:     98.5 degrees F oral Pulse rate:   68 / minute Pulse rhythm:   regular Resp:     14 per minute BP sitting:   130 / 68  (left arm) Cuff size:   regular  Vitals Entered By: Gladis Riffle, RN (July 20, 2010 11:19 AM) CC: 1 month rov-c/o back pain and leg pain and urinary pressure with frequency Is Patient Diabetic? No Comments Has been off potassium x 1 month as bottle said take x 3 months and quit   CC:  1 month rov-c/o back pain and leg pain and urinary pressure with frequency.  History of Present Illness:  Follow-Up Visit      [name] who presents for Follow-up visit.  The patient denies chest pain and palpitations.  Since the last visit the patient notes no new problems or concerns, but has chronic pains with fibromyalgia. hx of uti--has some dysuria.  The patient reports taking meds as prescribed.  When questioned about possible medication side effects, the patient notes none.    All other systems reviewed and were negative   Preventive Screening-Counseling & Management  Alcohol-Tobacco     Smoking Status: never  Current Problems (verified): 1)  Uti  (ICD-599.0) 2)  Rash-nonvesicular  (ICD-782.1) 3)  Gerd  (ICD-530.81) 4)  Reflex Sympathetic Dystrophy  (ICD-337.20) 5)  Hx of Lupus  (ICD-710.0) 6)  Cardiomyopathy  (ICD-425.4) 7)  Chronic Pain Syndrome  (ICD-338.4) 8)  Congestive Heart Failure, Hx of  (ICD-V12.50) 9)  Hypotension, Orthostatic  (ICD-458.0) 10)  Hyperlipidemia  (ICD-272.4) 11)  Myocardial Infarction  (ICD-410.90) 12)  Osteoporosis  (ICD-733.00) 13)  U T I  (ICD-599.0) 14)  Anemia-nos  (ICD-285.9) 15)  Ana Positive  (ICD-790.99) 16)  Dermatitis  (ICD-692.9) 17)  B12 Deficiency  (ICD-266.2) 18)  Hiatal Hernia  (ICD-553.3) 19)  Fibromyalgia  (ICD-729.1) 20)  Colonic Polyps, Hx of  (ICD-V12.72)  Current Medications  (verified): 1)  Duragesic-50 50 Mcg/hr Pt72 (Fentanyl) .... Change Every 72 Hours As Directed 2)  Cyanocobalamin 1000 Mcg/ml Soln (Cyanocobalamin) .... Inject Every Three Months At Dr Cato Mulligan Office 3)  Omeprazole 20 Mg Cpdr (Omeprazole) .... One By Mouth Daily 4)  Miralax  Powd (Polyethylene Glycol 3350) .... As Needed 5)  Senokot 8.6 Mg Tabs (Sennosides) .... Take 1 Tablet By Mouth Three Times A Day Hold For Diarrhea 6)  Triamcinolone Acetonide 0.5 % Crea (Triamcinolone Acetonide) .... Apply Bid To Affected Area 7)  Cellcept 500 Mg Tabs (Mycophenolate Mofetil) .... Take 1 Tablet By Mouth Once A Day 8)  Klonopin 0.5 Mg Tabs (Clonazepam) .... Two Times A Day  Allergies (verified): 1)  ! Cipro 2)  Morphine Sulfate (Morphine Sulfate) 3)  Sulfamethoxazole (Sulfamethoxazole) 4)  Tofranil (Imipramine Hcl) 5)  * Unna Boot  Past History:  Past Medical History: Last updated: 01/25/2010 RSD--R upper extremity  recurrent rash-undiagnosed responsive to cellcept headache neuritic dermatitis??? B12 deficiency ANA 1:160 hiatal hernia e coli sepsis--ARDA--VDRF--subendococcal MI carotid defieciency  interstitial cystitis  previous sepsis Anemia-NOS  Past Surgical History: Last updated: 09/05/2006 Cholecystectomy Hysterectomy Oophorectomy cystoscopy  Family History: Last updated: 02/08/2010 mother---esophageal CA father deceased MI Family History Diabetes 1st degree relative---father lung CAncer---brother Family History of Colitis: Sister Family History of Uterine Cancer: Sister Family History of Ovarian Cancer: Sister Family History of Breast  Cancer: Sister Family History of Prostate Cancer: Father Family History of Heart Disease:   Social History: Last updated: 11/10/2009 Married Never Smoked Alcohol use-no Regular exercise-no 4 kids---one with AIDs  Risk Factors: Exercise: no (07/09/2007)  Risk Factors: Smoking Status: never (07/20/2010)  Physical  Exam  General:  alert and well-developed.   Head:  normocephalic and atraumatic.   Eyes:  pupils equal and pupils round.   Neck:  No deformities, masses, or tenderness noted. Chest Wall:  No deformities, masses, or tenderness noted. Lungs:  normal respiratory effort and no intercostal retractions.   Abdomen:  soft and non-tender.   Skin:  healing excoriations on legs   Impression & Recommendations:  Problem # 1:  RASH-NONVESICULAR (ICD-782.1)  much improved Her updated medication list for this problem includes:    Triamcinolone Acetonide 0.5 % Crea (Triamcinolone acetonide) .Marland Kitchen... Apply bid to affected area continue cellcept needs labs today  Orders: Venipuncture (04540) TLB-CBC Platelet - w/Differential (85025-CBCD)  Problem # 2:  HYPOTENSION, ORTHOSTATIC (ICD-458.0) no recurrence  Problem # 3:  CHRONIC PAIN SYNDROME (ICD-338.4) no change  Problem # 4:  U T I (ICD-599.0) culture  Problem # 5:  ANEMIA-NOS (ICD-285.9)  Her updated medication list for this problem includes:    Cyanocobalamin 1000 Mcg/ml Soln (Cyanocobalamin) ..... Inject every three months at dr Paulette Lynch office  Hgb: 12.1 (05/11/2010)   Hct: 35.6 (05/11/2010)   Platelets: 196.0 (05/11/2010) RBC: 4.18 (05/11/2010)   RDW: 14.2 (05/11/2010)   WBC: 3.7 (05/11/2010) MCV: 85.1 (05/11/2010)   MCHC: 33.9 (05/11/2010) Ferritin: 65.7 (07/12/2009) Iron: 75 (02/11/2010)   % Sat: 20.6 (02/11/2010) B12: 327 (02/11/2010)   Folate: 9.4 (07/12/2009)   TSH: 0.55 (02/11/2010)  Complete Medication List: 1)  Duragesic-50 50 Mcg/hr Pt72 (Fentanyl) .... Change every 72 hours as directed 2)  Cyanocobalamin 1000 Mcg/ml Soln (Cyanocobalamin) .... Inject every three months at dr Everette Dimauro office 3)  Omeprazole 20 Mg Cpdr (Omeprazole) .... One by mouth daily 4)  Miralax Powd (Polyethylene glycol 3350) .... As needed 5)  Senokot 8.6 Mg Tabs (Sennosides) .... Take 1 tablet by mouth three times a day hold for diarrhea 6)  Triamcinolone  Acetonide 0.5 % Crea (Triamcinolone acetonide) .... Apply bid to affected area 7)  Cellcept 500 Mg Tabs (Mycophenolate mofetil) .... Take 1 tablet by mouth once a day 8)  Klonopin 0.5 Mg Tabs (Clonazepam) .... Two times a day  Other Orders: UA Dipstick w/o Micro (automated)  (81003) T-Culture, Urine (98119-14782) TLB-Lipid Panel (80061-LIPID) TLB-Hepatic/Liver Function Pnl (80076-HEPATIC) TLB-BMP (Basic Metabolic Panel-BMET) (80048-METABOL) TLB-TSH (Thyroid Stimulating Hormone) (84443-TSH)  Patient Instructions: 1)  6 weeks Prescriptions: DURAGESIC-50 50 MCG/HR PT72 (FENTANYL) change every 72 hours as directed  #10 x 0   Entered and Authorized by:   [name] MD   Signed by:   [name] MD on 07/20/2010   Method used:   Print then Give to Patient   RxID:   [id number]

## 2010-12-29 NOTE — Procedures (Signed)
Summary: EGD   EGD  Procedure date:  04/07/2003  Findings:      Location: [facility]   Patient Name: [name], [name] MRN:  Procedure Procedures: Panendoscopy (EGD) CPT: 43235.    with esophageal dilation. CPT: G9296129.  Personnel: Endoscopist: Ulyess Mort, MD.  Referred By: Valetta Mole Swords, MD.  Exam Location: Exam performed in Outpatient Clinic. Outpatient  Patient Consent: Procedure, Alternatives, Risks and Benefits discussed, consent obtained, from patient. Consent was obtained by the RN.  Indications Symptoms: Dysphagia. Dyspepsia, Abdominal pain,  History  Pre-Exam Physical: Performed Apr 07, 2003  Cardio-pulmonary exam, HEENT exam, Abdominal exam, Extremity exam, Mental status exam WNL.  Exam Exam Info: Maximum depth of insertion Duodenum, intended Duodenum. Vocal cords visualized. Gastric retroflexion was not performed. Images were not taken. ASA Classification: II. Tolerance: good.  Sedation Meds: Patient assessed and found to be appropriate for moderate (conscious) sedation. Fentanyl 50 mcg. given IV. Versed 5 mg. given IV. Cetacaine Spray 1 sprays given aerosolized.  Monitoring: BP and pulse monitoring done. Oximetry used. Supplemental O2 given  Findings - HIATAL HERNIA: 2 cms. in length. ICD9: Hernia, Hiatal: 553.3. - Dilation: Duodenal Bulb. Maloney dilator used, Diameter: 56 mm, Minimal Resistance, No Heme present on extraction. Patient tolerance good.  - MUCOSAL ABNORMALITY: Body to Antrum. Granular mucosa. ICD9: Gastritis, Chronic: 535.10. Comment: minnimal antritis.  - MUCOSAL ABNORMALITY: Duodenal Bulb to Jejunum. Granular mucosa. Comment: minnimal.   Assessment Abnormal examination, see findings above.  Diagnoses: 553.3: Hernia, Hiatal.  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   Events  Unplanned Intervention: No unplanned interventions were required.  Unplanned Events: There were no complications. Plans Medication(s): Continue  current medications.  Patient Education: Patient given standard instructions for: Hiatal Hernia. Mucosal Abnormality.  Disposition: After procedure patient sent to recovery. After recovery patient sent home.   This report was created from the original endoscopy report, which was reviewed and signed by the above listed endoscopist.

## 2010-12-29 NOTE — Progress Notes (Signed)
Summary: Dobutamine Echo Pre-Procedure  Phone Note Outgoing Call Call back at [facility] Phone [phone]   Call placed by: Irean Hong, RN,  February 16, 2010 1:14 PM Summary of Call: Reviewed instructions on Dobutamine Echo with patient.Tara Nichelson,RN.

## 2010-12-29 NOTE — Assessment & Plan Note (Signed)
Summary: B12 INJ // RS//pt rscd//ccm   Nurse Visit   Allergies: 1)  ! Cipro 2)  Morphine Sulfate (Morphine Sulfate) 3)  Sulfamethoxazole (Sulfamethoxazole) 4)  Tofranil (Imipramine Hcl) 5)  * Unna Boot  Medication Administration  Injection # 1:    Medication: Vit B12 1000 mcg    Diagnosis: B12 DEFICIENCY (ICD-266.2)    Route: IM    Site: L deltoid    Exp Date: 07/29/2011    Lot #: [id number]    Mfr: [employer] Regent    Patient tolerated injection without complications    Given by: Gladis Riffle, RN (March 23, 2010 1:24 PM)  Orders Added: 1)  Vit B12 1000 mcg [J3420] 2)  Admin of Therapeutic Inj  intramuscular or subcutaneous [96372]   Medication Administration  Injection # 1:    Medication: Vit B12 1000 mcg    Diagnosis: B12 DEFICIENCY (ICD-266.2)    Route: IM    Site: L deltoid    Exp Date: 07/29/2011    Lot #: [id number]    Mfr: American Regent    Patient tolerated injection without complications    Given by: Gladis Riffle, RN (March 23, 2010 1:24 PM)  Orders Added: 1)  Vit B12 1000 mcg [J3420] 2)  Admin of Therapeutic Inj  intramuscular or subcutaneous [19147]

## 2010-12-29 NOTE — Assessment & Plan Note (Signed)
Summary: 2 WK ROV//SLM   Vital Signs:  Patient profile:   65 year old female Weight:      127 pounds Temp:     98.0 degrees F oral Pulse rate:   70 / minute BP sitting:   104 / 64  (left arm) Cuff size:   regular  Vitals Entered By: Kathrynn Speed CMA (June 22, 2010 11:05 AM) CC: 2 wk rov, B12 injection, src   CC:  2 wk rov, B12 injection, and src.  History of Present Illness: rash_much better tolerating meds still some excoriations on legs  chronic pain---tolerating meds  syncope---? 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) 4)  Hx of Lupus  (ICD-710.0) 5)  Cardiomyopathy  (ICD-425.4) 6)  Chronic Pain Syndrome  (ICD-338.4) 7)  Congestive Heart Failure, Hx of  (ICD-V12.50) 8)  Hypotension, Orthostatic  (ICD-458.0) 9)  Hyperlipidemia  (ICD-272.4) 10)  Myocardial Infarction  (ICD-410.90) 11)  Osteoporosis  (ICD-733.00) 12)  U T I  (ICD-599.0) 13)  Anemia-nos  (ICD-285.9) 14)  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 Change Every 72 Hours As Directed 2)  Cyanocobalamin 1000 Mcg/ml Soln (Cyanocobalamin) .... Inject Every Three Months At Dr Cato Mulligan Office 3)  Omeprazole 20 Mg Cpdr (Omeprazole) .... One By Mouth Daily 4)  Klor-Con M20 20 Meq Cr-Tabs (Potassium Chloride Crys Cr) .... One Two Times A Day 5)  Miralax  Powd (Polyethylene Glycol 3350) .... As Needed 6)  Senokot 8.6 Mg Tabs (Sennosides) .... Take 1 Tablet By Mouth Three Times A Day Hold For Diarrhea 7)  Prednisone 20 Mg Tabs (Prednisone) .... 1/2 By Mouth Once Daily or As Directed 8)  Triamcinolone Acetonide 0.5 % Crea  (Triamcinolone Acetonide) .... Apply Bid To Affected Area 9)  Cellcept 500 Mg Tabs (Mycophenolate Mofetil) .... Take 1 Tablet By Mouth Once A Day 10)  Klonopin 0.5 Mg Tabs (Clonazepam) .... Two Times A Day  Allergies (verified): 1)  ! Cipro 2)  Morphine Sulfate (Morphine Sulfate) 3)  Sulfamethoxazole (Sulfamethoxazole) 4)  Tofranil (Imipramine Hcl) 5)  * Unna Boot  Past History:  Past Medical History: Last updated: 01/25/2010 RSD--R upper extremity  recurrent rash-undiagnosed responsive to cellcept headache neuritic dermatitis??? B12 deficiency ANA 1:160 hiatal hernia e coli sepsis--ARDA--VDRF--subendococcal MI carotid defieciency  interstitial cystitis  previous sepsis Anemia-NOS  Past Surgical History: Last updated: 09/05/2006 Cholecystectomy Hysterectomy Oophorectomy cystoscopy  Family History: Last updated: 02/08/2010 mother---esophageal CA father deceased MI Family History Diabetes 1st degree relative---father lung CAncer---brother Family History of Colitis: Sister Family History of Uterine Cancer: Sister Family History of Ovarian Cancer: Sister Family History of Breast Cancer: Sister Family History of Prostate Cancer: Father Family History of Heart Disease:   Social History: Last updated: 11/10/2009 Married Never Smoked Alcohol use-no Regular exercise-no 4 kids---one with AIDs  Risk Factors: Exercise: no (07/09/2007)  Risk Factors: Smoking Status: never (06/01/2010)  Physical Exam  General:  alert and well-developed.   Head:  normocephalic and atraumatic.   Eyes:  pupils equal and pupils round.   Ears:  R ear normal and L ear normal.   Neck:  No deformities, masses, or tenderness  noted. Chest Wall:  No deformities, masses, or tenderness noted. Lungs:  normal respiratory effort and no intercostal retractions.   Abdomen:  soft and non-tender.   Msk:  normal ROM and no joint tenderness.   Skin:  healing excoriations on legs   Impression  & Recommendations:  Problem # 1:  RASH-NONVESICULAR (ICD-782.1)  improving stay on cellcept see me 2 weeks Her updated medication list for this problem includes:    Triamcinolone Acetonide 0.5 % Crea (Triamcinolone acetonide) .Marland Kitchen... Apply bid to affected area  Problem # 2:  HYPOTENSION, ORTHOSTATIC (ICD-458.0) cause os syncope will not resume fluorinef.   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 Change every 72 hours as directed 2)  Cyanocobalamin 1000 Mcg/ml Soln (Cyanocobalamin) .... Inject every three months at dr Derris Millan office 3)  Omeprazole 20 Mg Cpdr (Omeprazole) .... One by mouth daily 4)  Klor-con M20 20 Meq Cr-tabs (Potassium chloride crys cr) .... One two times a day 5)  Miralax Powd (Polyethylene glycol 3350) .... As needed 6)  Senokot 8.6 Mg Tabs (Sennosides) .... Take 1 tablet by mouth three times a day hold for diarrhea 7)  Prednisone 20 Mg Tabs (Prednisone) .... 1/2 by mouth once daily or as directed 8)  Triamcinolone Acetonide 0.5 % Crea (Triamcinolone acetonide) .... Apply bid to affected area 9)  Cellcept 500 Mg Tabs (Mycophenolate mofetil) .... Take 1 tablet by mouth once a day 10)  Klonopin 0.5 Mg Tabs (Clonazepam) .... Two times a day  Other Orders: Vit B12 1000 mcg (J3420) Admin of Therapeutic Inj  intramuscular or subcutaneous (16109)  Patient Instructions: 1)  Please schedule a follow-up appointment in 1 month. Prescriptions: DURAGESIC-50 50 MCG/HR PT72 (FENTANYL) change every 72 hours as directed  #10 x 0   Entered and Authorized by:   [name] MD   Signed by:   [name] MD on 06/22/2010   Method used:   Print then Give to Patient   RxID:   [id number]    Medication Administration  Injection # 1:    Medication: Vit B12 1000 mcg    Diagnosis: ANEMIA-NOS (ICD-285.9)     Route: IM    Site: L deltoid    Exp Date: 0647    Lot #: 07/29/11    Mfr: American Regent    Patient tolerated injection without complications    Given by: Kern Reap CMA Duncan Dull) (June 22, 2010 12:15 PM)  Orders Added: 1)  Vit B12 1000 mcg [J3420] 2)  Admin of Therapeutic Inj  intramuscular or subcutaneous [[id number]]

## 2010-12-29 NOTE — Assessment & Plan Note (Signed)
Summary: 2 day rov/njr 745am   Allergies: 1)  ! Cipro 2)  Morphine Sulfate (Morphine Sulfate) 3)  Sulfamethoxazole (Sulfamethoxazole) 4)  Tofranil (Imipramine Hcl) 5)  * Unna Boot   Impression & Recommendations:  Problem # 1:  RASH-NONVESICULAR (ICD-782.1)  rash is evolving areas where rash started are improving: ---in those areas (legs) she is left with dry, scaley, mildly erythematous skin she has some new areas on arms- between fingers  open wound on ankle is better (right).   for now will continue prednisone 10 mg by mouth once daily  I want to see her Thursday or Friday  topical steroid cream  will add steroid  total time FTF 15 minutes  Her updated medication list for this problem includes:    Triamcinolone Acetonide 0.5 % Crea (Triamcinolone acetonide) .Marland Kitchen... Apply bid to affected area  Complete Medication List: 1)  Duragesic-50 50 Mcg/hr Pt72 (Fentanyl) .... Change every 72 hours as directed 2)  Cyanocobalamin 1000 Mcg/ml Soln (Cyanocobalamin) .... Inject every three months at dr Shamya Macfadden office 3)  Nexium 40 Mg Cpdr (Esomeprazole magnesium) .... Take 1 tablet by mouth two times a day (pharmacy-please discontinue prescription previously sent for omeprazole) 4)  Klor-con M20 20 Meq Cr-tabs (Potassium chloride crys cr) .... One two times a day 5)  Miralax Powd (Polyethylene glycol 3350) .... As needed 6)  Senokot 8.6 Mg Tabs (Sennosides) .... Take 1 tablet by mouth three times a day hold for diarrhea 7)  Carafate 1 Gm Tabs (Sucralfate) .Marland Kitchen.. 1 pill by mouth two times a day 8)  Prednisone 20 Mg Tabs (Prednisone) .... 1/2 by mouth once daily or as directed 9)  Hydroxyzine Hcl 25 Mg Tabs (Hydroxyzine hcl) .... Take 1 tablet by mouth three times a day as needed pruritis 10)  Triamcinolone Acetonide 0.5 % Crea (Triamcinolone acetonide) .... Apply bid to affected area  Patient Instructions: 1)  . Prescriptions: TRIAMCINOLONE ACETONIDE 0.5 % CREA (TRIAMCINOLONE ACETONIDE)  apply bid to affected area  #30 grams x 1   Entered and Authorized by:   [name] MD   Signed by:   [name] MD on 05/16/2010   Method used:   Electronically to        [facility] (retail)       [address]       [place], [place]  [address]       Ph: [phone]       Fax: [phone]   RxID:   [id number]   Appended Document: Orders Update     Clinical Lists Changes  Orders: Added new Service order of Est. Patient Level III (44010) - Signed      Appended Document: 2 day rov/njr 745am time spent >15 minutes >1/2 time counseling regarding rash and treatment options

## 2010-12-29 NOTE — Progress Notes (Signed)
Summary: lab results  Phone Note Call from Patient   Caller: Patient Call For: [name] MD Summary of Call: Pt. is asking for lab results, and wants to know when to come back? [phone] Initial call taken by: Lynann Beaver CMA,  Apr 21, 2010 8:37 AM  Follow-up for Phone Call        see append to the lab data.  See me one month. Follow-up by: [name] MD,  Apr 21, 2010 2:33 PM  Additional Follow-up for Phone Call Additional follow up Details #1::        Left message on voice mail per pt request. Additional Follow-up by: [facility] CMA,  Apr 21, 2010 3:04 PM

## 2010-12-29 NOTE — Assessment & Plan Note (Signed)
Summary: 6 WK ROV // RS   Vital Signs:  Patient profile:   65 year old female Weight:      128 pounds Temp:     98.6 degrees F oral BP sitting:   118 / 70  (left arm) Cuff size:   regular  Vitals Entered By: Sid Falcon LPN (September 02, 2010 10:45 AM)  History of Present Illness:  Follow-Up Visit      [name] who presents for Follow-up visit.  The patient denies chest pain and palpitations.  Since the last visit the patient notes no new problems or concerns.  The patient reports taking meds as prescribed.  When questioned about possible medication side effects, the patient notes none.    Allergies: 1)  Morphine Sulfate (Morphine Sulfate) 2)  Sulfamethoxazole (Sulfamethoxazole) 3)  Tofranil (Imipramine Hcl) 4)  * Unna Boot  Past History:  Past Medical History: Last updated: 01/25/2010 RSD--R upper extremity  recurrent rash-undiagnosed responsive to cellcept headache neuritic dermatitis??? B12 deficiency ANA 1:160 hiatal hernia e coli sepsis--ARDA--VDRF--subendococcal MI carotid defieciency  interstitial cystitis  previous sepsis Anemia-NOS  Past Surgical History: Last updated: 09/05/2006 Cholecystectomy Hysterectomy Oophorectomy cystoscopy  Family History: Last updated: 02/08/2010 mother---esophageal CA father deceased MI Family History Diabetes 1st degree relative---father lung CAncer---brother Family History of Colitis: Sister Family History of Uterine Cancer: Sister Family History of Ovarian Cancer: Sister Family History of Breast Cancer: Sister Family History of Prostate Cancer: Father Family History of Heart Disease:   Social History: Last updated: 11/10/2009 Married Never Smoked Alcohol use-no Regular exercise-no 4 kids---one with AIDs  Risk Factors: Exercise: no (07/09/2007)  Risk Factors: Smoking Status: never (07/20/2010)  Physical Exam  General:  alert and well-developed.   Head:  normocephalic and  atraumatic.   Neck:  No deformities, masses, or tenderness noted. Chest Wall:  No deformities, masses, or tenderness noted. Lungs:  normal respiratory effort and no intercostal retractions.   Heart:  normal rate and regular rhythm.   Abdomen:  soft and non-tender.   Msk:  normal ROM and no joint tenderness.   Neurologic:  alert & oriented X3 and cranial nerves II-XII intact.     Impression & Recommendations:  Problem # 1:  UTI (ICD-599.0)  abx side effects discussed  Encouraged to push clear liquids, get enough rest, and take acetaminophen as needed. To be seen in 10 days if no improvement, sooner if worse.  Orders: T-Culture, Urine ([id number])  Her updated medication list for this problem includes:    Cipro 500 Mg Tabs (Ciprofloxacin hcl) .Marland Kitchen... 1 by mouth 2 times daily  Problem # 2:  HYPOTENSION, ORTHOSTATIC (ICD-458.0) resolved  Problem # 3:  DERMATITIS (ICD-692.9) improved continue Cell cept---labs today Her updated medication list for this problem includes:    Triamcinolone Acetonide 0.5 % Crea (Triamcinolone acetonide) .Marland Kitchen... Apply bid to affected area  Complete Medication List: 1)  Duragesic-50 50 Mcg/hr Pt72 (Fentanyl) .... Change every 72 hours as directed 2)  Cyanocobalamin 1000 Mcg/ml Soln (Cyanocobalamin) .... Inject every three months at dr swords office 3)  Omeprazole 20 Mg Cpdr (Omeprazole) .... One by mouth daily 4)  Miralax Powd (Polyethylene glycol 3350) .... As needed 5)  Senokot 8.6 Mg Tabs (Sennosides) .... Take 1 tablet by mouth three times a day hold for diarrhea 6)  Triamcinolone Acetonide 0.5 % Crea (Triamcinolone acetonide) .... Apply bid to affected area 7)  Cellcept 500 Mg Tabs (Mycophenolate mofetil) .... Take 1 tablet by mouth once  a day 8)  Klonopin 0.5 Mg Tabs (Clonazepam) .... Two times a day 9)  Cipro 500 Mg Tabs (Ciprofloxacin hcl) .Marland Kitchen.. 1 by mouth 2 times daily  Other Orders: Venipuncture ([address]) Specimen Handling ([address]130) Flu Vaccine  90yrs + MEDICARE PATIENTS (Q6578) Administration Flu vaccine - MCR (G0008) TLB-CBC Platelet - w/Differential (85025-CBCD) TLB-BMP (Basic Metabolic Panel-BMET) (80048-METABOL) TLB-Hepatic/Liver Function Pnl (80076-HEPATIC) TLB-Lipid Panel (80061-LIPID) TLB-TSH (Thyroid Stimulating Hormone) (46962-XBM)  Patient Instructions: 1)  Please schedule a follow-up appointment in 1 month. Prescriptions: CIPRO 500 MG TABS (CIPROFLOXACIN HCL) 1 by mouth 2 times daily  #14 x 0   Entered and Authorized by:   [name] MD   Signed by:   [name] MD on 09/02/2010   Method used:   Electronically to        [facility] [address] (retail)       [address]       [place], [place]  [address]       Ph: [phone]       Fax: [phone]   RxID:   [id number]   Laboratory Results   Urine Tests    Routine Urinalysis   Color: yellow Appearance: Clear Glucose: negative   (Normal Range: Negative) Bilirubin: negative   (Normal Range: Negative) Ketone: negative   (Normal Range: Negative) Spec. Gravity: 1.025   (Normal Range: 1.003-1.035) Blood: trace-lysed   (Normal Range: Negative) pH: 5.5   (Normal Range: 5.0-8.0) Protein: negative   (Normal Range: Negative) Urobilinogen: 0.2   (Normal Range: 0-1) Nitrite: negative   (Normal Range: Negative) Leukocyte Esterace: 2+   (Normal Range: Negative)    Comments: [name]  September 02, 2010 11:36 AM       Flu Vaccine Consent Questions     Do you have a history of severe allergic reactions to this vaccine? no    Any prior history of allergic reactions to egg and/or gelatin? no    Do you have a sensitivity to the preservative Thimersol? no    Do you have a past history of Guillan-Barre Syndrome? no    Do you currently have an acute febrile illness? no    Have you ever had a severe reaction to latex? no    Vaccine information given and explained to patient? yes    Are you currently  pregnant? no    Lot Number:[id number]   Exp Date:05/27/2011   Site Given  Left Deltoid IMlu

## 2010-12-29 NOTE — Procedures (Signed)
Summary: Upper Endoscopy  Patient: [name] Note: All result statuses are Final unless otherwise noted.  Tests: (1) Upper Endoscopy (EGD)   EGD Upper Endoscopy       DONE     [facility]     [address]     [place], [place]  [address]           ENDOSCOPY PROCEDURE REPORT           PATIENT:  [name], [name]  MR#:  [id number]     BIRTHDATE:  1946-03-02, 63 yrs. old  GENDER:  female           ENDOSCOPIST:  Hedwig Morton. Juanda Chance, MD     Referred by:  [name], M.D.           PROCEDURE DATE:  02/21/2010     PROCEDURE:  EGD with biopsy     ASA CLASS:  Class II     INDICATIONS:  dysphagia, heartburn EGD 2004, Ba swallow 2004 was     normal           MEDICATIONS:   Versed 6 mg, Fentanyl 50 mcg     TOPICAL ANESTHETIC:  Exactacain Spray           DESCRIPTION OF PROCEDURE:   After the risks benefits and     alternatives of the procedure were thoroughly explained, informed     consent was obtained.  The LB GIF-H180 [id number] endoscope was     introduced through the mouth and advanced to the second portion of     the duodenum, without limitations.  The instrument was slowly     withdrawn as the mucosa was fully examined.     <<PROCEDUREIMAGES>>           The upper, middle, and distal third of the esophagus were     carefully inspected and no abnormalities were noted. The z-line     was well seen at the GEJ. The endoscope was pushed into the fundus     which was normal including a retroflexed view. The antrum,gastric     body, first and second part of the duodenum were unremarkable (see     image1, image2, and image8). no stricture,  Retained food was     present. retained bile, Bx r/o H.pylori With standard forceps, a     biopsy was obtained and sent to pathology (see image7, image6, and     image3).  Normal duodenal folds were noted. r/o villous atrophy     With standard forceps, a biopsy was obtained and sent to pathology     (see image4).    Retroflexed views revealed no  abnormalities.     The scope was then withdrawn from the patient and the procedure     completed.           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  ______________________________     Hedwig Morton. Juanda Chance, MD           CC:           n.     eSIGNED:   Hedwig Morton. Kmarion Rawl at 02/21/2010 04:11 PM           Demary, Renea Ee, [id number]  Note: An exclamation mark (!) indicates a result that was not dispersed into the flowsheet. Document Creation Date: 02/21/2010 4:12 PM _______________________________________________________________________  (1) Order result status: Final Collection or observation date-time: 02/21/2010 15:59 Requested date-time:  Receipt date-time:  Reported date-time:  Referring Physician:   Ordering Physician: Lina Sar [phone]) Specimen Source:  Source: Launa Grill Order Number: [id number] Lab site:

## 2010-12-29 NOTE — Letter (Signed)
Summary: New Patient letter  [facility]  [address] [place], [place] [address]   Phone: [phone]  Fax: [phone]       12/28/2009 MRN: [id number]  [facility] [place] [address] [place], [place]  [address]  Dear [name],  Welcome to the Gastroenterology Division at [facility].    You are scheduled to see Dr.  Juanda Chance on 02-11-10 at 10:30am on the 3rd floor at [facility], [address]  We ask that you try to arrive at our office 15 minutes prior to your appointment time to allow for check-in.  We would like you to complete the enclosed self-administered evaluation form prior to your visit and bring it with you on the day of your appointment.  We will review it with you.  Also, please bring a complete list of all your medications or, if you prefer, bring the medication bottles and we will list them.  Please bring your insurance card so that we may make a copy of it.  If your insurance requires a referral to see a specialist, please bring your referral form from your primary care physician.  Co-payments are due at the time of your visit and may be paid by cash, check or credit card.     Your office visit will consist of a consult with your physician (includes a physical exam), any laboratory testing he/she may order, scheduling of any necessary diagnostic testing (e.g. x-ray, ultrasound, CT-scan), and scheduling of a procedure (e.g. Endoscopy, Colonoscopy) if required.  Please allow enough time on your schedule to allow for any/all of these possibilities.    If you cannot keep your appointment, please call [phone] to cancel or reschedule prior to your appointment date.  This allows [place] the opportunity to schedule an appointment for another patient in need of care.  If you do not cancel or reschedule by 5 p.m. the business day prior to your appointment date, you will be charged a $50.00 late cancellation/no-show fee.    Thank you for choosing  [facility] for your medical needs.  We appreciate the opportunity to care for you.  Please visit [place] at our website  to learn more about our practice.                     Sincerely,                                                             The Gastroenterology Division

## 2010-12-29 NOTE — Miscellaneous (Signed)
   Clinical Lists Changes  Orders: Added new Service order of Vit B12 1000 mcg (Z6109) - Signed Added new Service order of Admin of Therapeutic Inj  intramuscular or subcutaneous (60454) - Signed      Medication Administration  Injection # 1:    Medication: Vit B12 1000 mcg    Diagnosis: B12 DEFICIENCY (ICD-266.2)    Route: SQ    Site: L deltoid    Exp Date: 05/28/2012    Lot #: [id number]    Mfr: [employer] Regent    Patient tolerated injection without complications    Given by: Lynann Beaver CMA AAMA (September 22, 2010 10:33 AM)  Orders Added: 1)  Vit B12 1000 mcg [J3420] 2)  Admin of Therapeutic Inj  intramuscular or subcutaneous [09811]

## 2010-12-29 NOTE — Assessment & Plan Note (Signed)
Summary: 1 mo rov/mm   Vital Signs:  Patient profile:   65 year old female Weight:      119 pounds Temp:     98.2 degrees F oral Pulse rate:   68 / minute Pulse rhythm:   regular Resp:     12 per minute BP sitting:   106 / 64  (left arm) Cuff size:   regular  Vitals Entered By: Gladis Riffle, RN (January 25, 2010 10:56 AM) CC: 1 month rov--states is about the same with pain unchanged Is Patient Diabetic? No   CC:  1 month rov--states is about the same with pain unchanged.  History of Present Illness:  Follow-Up Visit      [name] who presents for Follow-up visit.  The patient denies chest pain and palpitations.  Since the last visit the patient notes no new problems or concerns.  The patient reports taking meds as prescribed.  When questioned about possible medication side effects, the patient notes none.  She continues to struggle with fatigue---she has not had recurrent syncope. 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 Change Every 72 Hours As Directed 2)  Cyanocobalamin 1000 Mcg/ml Soln (Cyanocobalamin) .... Inject Every Three Months At Dr Cato Mulligan Office 3)  Clonazepam Odt 0.5 Mg  Tbdp (Clonazepam) .... One Two Times A Day As Needed 4)  Famotidine 20 Mg  Tabs (Famotidine) .... Take 1 Tab By Mouth At Bedtime 5)   Fludrocortisone Acetate 0.1 Mg  Tabs (Fludrocortisone Acetate) .... Take 1 Tablet By Mouth Once A Day 6)  Vitamin D 1000 Unit  Caps (Cholecalciferol) .... Once Daily 7)  Omeprazole 20 Mg  Cpdr (Omeprazole) .... Take 1 Capsule By Mouth Once A Day 8)  Klor-Con M20 20 Meq Cr-Tabs (Potassium Chloride Crys Cr) .... One Two Times A Day 9)  Ferrous Sulfate 325 (65 Fe) Mg Tabs (Ferrous Sulfate) .... Take 1 Tablet By Mouth Two Times A Day X 3 Months 10)  Alendronate Sodium 70 Mg  Tabs (Alendronate Sodium) .Marland Kitchen.. 1 By Mouth Q Week . Take With 8 Ounces of Water On and Empty Stomach  Current Medications (verified): 1)  Duragesic-50 50 Mcg/hr Pt72 (Fentanyl) .... Change Every 72 Hours As Directed 2)  Cyanocobalamin 1000 Mcg/ml Soln (Cyanocobalamin) .... Inject Every Three Months At Dr Cato Mulligan Office 3)  Clonazepam Odt 0.5 Mg  Tbdp (Clonazepam) .... One Two Times A Day As Needed 4)  Famotidine 20 Mg  Tabs (Famotidine) .... Take 1 Tab By Mouth At Bedtime 5)  Fludrocortisone Acetate 0.1 Mg  Tabs (Fludrocortisone Acetate) .... Take 1 Tablet By Mouth Once A Day 6)  Vitamin D 1000 Unit  Caps (Cholecalciferol) .... Once Daily 7)  Omeprazole 20 Mg  Cpdr (Omeprazole) .... Take 1 Capsule By Mouth Once A Day 8)  Klor-Con M20  20 Meq Cr-Tabs (Potassium Chloride Crys Cr) .... One Two Times A Day 9)  Ferrous Sulfate 325 (65 Fe) Mg Tabs (Ferrous Sulfate) .... Take 1 Tablet By Mouth Two Times A Day X 3 Months 10)  Alendronate Sodium 70 Mg  Tabs (Alendronate Sodium) .Marland Kitchen.. 1 By Mouth Q Week . Take With 8 Ounces of Water On and Empty Stomach  Allergies (verified): 1)  Morphine Sulfate (Morphine Sulfate) 2)  Sulfamethoxazole (Sulfamethoxazole) 3)  Tofranil (Imipramine Hcl) 4)  * Unna Boot  Past History:  Past Medical History: Last updated: 09/25/2007 RSD--R upper extremity fibromyalgia recurrent rash-undiagnosed responsive to cellcept headache neuritic dermatitis??? B12 deficiency ANA 1:160 hiatal hernia e coli  sepsis--ARDA--VDRF--subendococcal MI carotid defieciency Colonic polyps, hx of GERD Anemia-NOS  Past Surgical History: Last updated: 09/05/2006 Cholecystectomy Hysterectomy Oophorectomy cystoscopy  Family History: Last updated: 08/23/2009 mother---esophageal CA father deceased MI Family History Diabetes 1st degree relative---father lung CAncer---brother  Social History: Last updated: 11/10/2009 Married Never Smoked Alcohol use-no Regular exercise-no 4 kids---one with AIDs  Risk Factors: Exercise: no (07/09/2007)  Risk Factors: Smoking Status: never (01/25/2010)  Physical Exam  General:  Well-developed,well-nourished,in no acute distress; alert,appropriate and cooperative throughout examination Head:  normocephalic and atraumatic.   Eyes:  pupils equal and pupils round.   Ears:  R ear normal and L ear normal.   Neck:  No deformities, masses, or tenderness noted. Chest Wall:  No deformities, masses, or tenderness noted. Lungs:  normal respiratory effort and no intercostal retractions.   Heart:  normal rate and regular rhythm.   Abdomen:  soft and non-tender.   Msk:  No deformity or scoliosis noted of thoracic or lumbar spine.   Neurologic:  cranial nerves II-XII intact and gait normal.     Impression & Recommendations:  Problem # 1:  HYPOTENSION, ORTHOSTATIC (ICD-458.0) minimal sxs tolerating florinef--continue  Problem # 2:  OSTEOPOROSIS (ICD-733.00)  says alendronate causes GI sxs "the day i take the medicine) change to boniva (may be a candidate for reclast) Her updated medication list for this problem includes:    Alendronate Sodium 70 Mg Tabs (Alendronate sodium) .Marland Kitchen... 1 by mouth q week . take with 8 ounces of water on and empty stomach  Problem # 3:  U T I (ICD-599.0)  seems to be recurrent septic twice--needs suppressive therapy discussed with Dr. Vernie Ammons--- treat with Cipro and then prophylaxis with trimethoprim see me one month may need  appt with urology  Patient: Jezel Teare Note: All result statuses are Final unless otherwise noted.  Tests: (1) Culture, Urine (16109) ! COLONY COUNT:       RESULT: 75,000 COLONIES/ML ! FINAL REPORT       RESULT: CITROBACTER FREUNDII  Tests: (2) Sensitivity for: CITROBACTER FREUNDII (CFRE) ! IMIPENEM                  S, <=1 ! CEFAZOLIN                 R, >=64 ! CEFOXITIN                 R, 32 ! CEFTRIAXONE               S, <=1 ! CEFTAZIDIME               S, <=1 ! CEFEPIME                  S, <=1 ! AMIKACIN  S, <=2 ! GENTAMICIN                S, <=1 ! TOBRAMYCIN                S, <=1 ! CIPROFLOXACIN             S, <=0.25 ! LEVOFLOXACIN              S, 0.25 ! NITROFURANTOIN            S, <=16 ! TRIMETH/SULFA             S, <=20  this is a different organism  Her updated medication list for this problem includes:    Cipro 500 Mg Tabs (Ciprofloxacin hcl) .Marland Kitchen... 1 by mouth 2 times daily    Trimethoprim 100 Mg Tabs (Trimethoprim) .Marland Kitchen... Take 1 tablet by mouth once a day. start after completing cipro  Problem # 4:  HYPERLIPIDEMIA (ICD-272.4) no treatment  Complete Medication List: 1)  Duragesic-50 50 Mcg/hr Pt72 (Fentanyl) .... Change every 72 hours as directed 2)  Cyanocobalamin 1000 Mcg/ml Soln (Cyanocobalamin) .... Inject every three months at dr Aloni Chuang office 3)  Clonazepam Odt 0.5 Mg Tbdp (Clonazepam) .... One two times a day as needed 4)  Famotidine 20 Mg Tabs (Famotidine) .... Take 1 tab by mouth at bedtime 5)  Fludrocortisone Acetate 0.1 Mg Tabs (Fludrocortisone acetate) .... Take 1 tablet by mouth once a day 6)  Vitamin D 1000 Unit Caps (Cholecalciferol) .... Once daily 7)  Omeprazole 20 Mg Cpdr (Omeprazole) .... Take 1 capsule by mouth once a day 8)  Klor-con M20 20 Meq Cr-tabs (Potassium chloride crys cr) .... One two times a day 9)  Ferrous Sulfate 325 (65 Fe) Mg Tabs (Ferrous sulfate) .... Take 1 tablet by mouth two times a day x 3 months 10)   Alendronate Sodium 70 Mg Tabs (Alendronate sodium) .Marland Kitchen.. 1 by mouth q week . take with 8 ounces of water on and empty stomach 11)  Cipro 500 Mg Tabs (Ciprofloxacin hcl) .Marland Kitchen.. 1 by mouth 2 times daily 12)  Trimethoprim 100 Mg Tabs (Trimethoprim) .... Take 1 tablet by mouth once a day. start after completing cipro  Other Orders: T-Lumbar Spine Complete, 5 Views [id number])  Patient Instructions: 1)  Please schedule a follow-up appointment in 1 month. Prescriptions: DURAGESIC-50 50 MCG/HR PT72 (FENTANYL) change every 72 hours as directed  #10 x 0   Entered and Authorized by:   [name] MD   Signed by:   [name] MD on 01/25/2010   Method used:   Print then Give to Patient   RxID:   [id number] TRIMETHOPRIM 100 MG TABS (TRIMETHOPRIM) Take 1 tablet by mouth once a day. start after completing cipro  #30 x 3   Entered and Authorized by:   [name] MD   Signed by:   [name] MD on 01/25/2010   Method used:   Electronically to        [facility] [address] (retail)       [address]       [place], [place]  [address]       Ph: [phone]       Fax: [phone]   RxID:   [id number] CIPRO 500 MG TABS (CIPROFLOXACIN HCL) 1 by mouth 2 times daily  #14 x 0   Entered and Authorized by:   [name] MD  Signed by:   [name] MD on 01/25/2010   Method used:   Electronically to        [facility] [address] (retail)       [address], [address]       [place], [place]  [address]       Ph: [phone]       Fax: [phone]   RxID:   [id number]

## 2010-12-29 NOTE — Assessment & Plan Note (Signed)
Summary: ? UTI/et   Vital Signs:  Patient profile:   65 year old female Height:      66 inches Temp:     97.1 degrees F oral Pulse rate:   72 / minute Resp:     14 per minute BP sitting:   120 / 70  (left arm)  Vitals Entered By: Willy Eddy, LPN (July 01, 2010 11:14 AM) CC: c/o dysuria and foul smelling with inability to void Is Patient Diabetic? No   CC:  c/o dysuria and foul smelling with inability to void.  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) 4)  Hx of Lupus  (ICD-710.0) 5)  Cardiomyopathy  (ICD-425.4) 6)  Chronic Pain Syndrome  (ICD-338.4) 7)  Congestive Heart Failure, Hx of  (ICD-V12.50) 8)  Hypotension, Orthostatic  (ICD-458.0) 9)  Hyperlipidemia  (ICD-272.4) 10)  Myocardial Infarction  (ICD-410.90) 11)  Osteoporosis  (ICD-733.00) 12)  U T I  (ICD-599.0) 13)  Anemia-nos  (ICD-285.9) 14)  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 Change Every 72 Hours As Directed 2)  Cyanocobalamin 1000 Mcg/ml Soln (Cyanocobalamin) .... Inject Every Three Months At Dr Cato Mulligan Office 3)  Omeprazole 20 Mg Cpdr (Omeprazole) .... One By Mouth Daily 4)  Klor-Con M20 20 Meq Cr-Tabs (Potassium Chloride Crys Cr) .... One Two Times A Day 5)  Miralax  Powd (Polyethylene Glycol 3350) .... As Needed 6)  Senokot 8.6 Mg Tabs  (Sennosides) .... Take 1 Tablet By Mouth Three Times A Day Hold For Diarrhea 7)  Prednisone 20 Mg Tabs (Prednisone) .... 1/2 By Mouth Once Daily or As Directed 8)  Triamcinolone Acetonide 0.5 % Crea (Triamcinolone Acetonide) .... Apply Bid To Affected Area 9)  Cellcept 500 Mg Tabs (Mycophenolate Mofetil) .... Take 1 Tablet By Mouth Once A Day 10)  Klonopin 0.5 Mg Tabs (Clonazepam) .... Two Times A Day  Allergies (verified): 1)  ! Cipro 2)  Morphine Sulfate (Morphine Sulfate) 3)  Sulfamethoxazole (Sulfamethoxazole) 4)  Tofranil (Imipramine Hcl) 5)  * Unna Boot  Past History:  Past Medical History: Last updated: 01/25/2010 RSD--R upper extremity  recurrent rash-undiagnosed responsive to cellcept headache neuritic dermatitis??? B12 deficiency ANA 1:160 hiatal hernia e coli sepsis--ARDA--VDRF--subendococcal MI carotid defieciency  interstitial cystitis  previous sepsis Anemia-NOS  Past Surgical History: Last updated: 09/05/2006 Cholecystectomy Hysterectomy Oophorectomy cystoscopy  Family History: Last updated: 02/08/2010 mother---esophageal CA father deceased MI Family History Diabetes 1st degree relative---father lung CAncer---brother Family History of Colitis: Sister Family History of Uterine Cancer: Sister Family History of Ovarian Cancer: Sister Family History of Breast Cancer: Sister Family History of Prostate Cancer: Father Family History of Heart Disease:   Social History: Last updated: 11/10/2009 Married Never Smoked Alcohol use-no Regular exercise-no 4 kids---one with AIDs  Risk Factors: Exercise: no (07/09/2007)  Risk Factors: Smoking Status: never (07/01/2010)  Physical Exam  General:  alert and  well-developed.   Genitalia:  no wuprapubic of cva pain   Impression & Recommendations:  Problem # 1:  U T I (ICD-599.0)  check urine sample emperic abx  Her updated medication list for this problem includes:    Cipro 500 Mg Tabs  (Ciprofloxacin hcl) .Marland Kitchen... 1 by mouth 2 times daily  Encouraged to push clear liquids, get enough rest, and take acetaminophen as needed. To be seen in 10 days if no improvement, sooner if worse.  Complete Medication List: 1)  Duragesic-50 50 Mcg/hr Pt72 (Fentanyl) .... Change every 72 hours as directed 2)  Cyanocobalamin 1000 Mcg/ml Soln (Cyanocobalamin) .... Inject every three months at dr swords office 3)  Omeprazole 20 Mg Cpdr (Omeprazole) .... One by mouth daily 4)  Klor-con M20 20 Meq Cr-tabs (Potassium chloride crys cr) .... One two times a day 5)  Miralax Powd (Polyethylene glycol 3350) .... As needed 6)  Senokot 8.6 Mg Tabs (Sennosides) .... Take 1 tablet by mouth three times a day hold for diarrhea 7)  Prednisone 20 Mg Tabs (Prednisone) .... 1/2 by mouth once daily or as directed 8)  Triamcinolone Acetonide 0.5 % Crea (Triamcinolone acetonide) .... Apply bid to affected area 9)  Cellcept 500 Mg Tabs (Mycophenolate mofetil) .... Take 1 tablet by mouth once a day 10)  Klonopin 0.5 Mg Tabs (Clonazepam) .... Two times a day 11)  Cipro 500 Mg Tabs (Ciprofloxacin hcl) .Marland Kitchen.. 1 by mouth 2 times daily  Other Orders: UA Dipstick w/o Micro (automated)  (81003) Prescriptions: CIPRO 500 MG TABS (CIPROFLOXACIN HCL) 1 by mouth 2 times daily  #14 x 0   Entered and Authorized by:   [name] MD   Signed by:   [name] MD on 07/01/2010   Method used:   Electronically to        [facility] [address] (retail)       [address]       [place], [place]  [address]       Ph: [phone]       Fax: [phone]   RxID:   [id number]   Appended Document: ? UTI/et  Laboratory Results   Urine Tests    Routine Urinalysis   Color: yellow Appearance: Clear Glucose: negative   (Normal Range: Negative) Bilirubin: negative   (Normal Range: Negative) Ketone: trace (5)   (Normal Range: Negative) Spec. Gravity: 1.020   (Normal Range:  1.003-1.035) Blood: 1+   (Normal Range: Negative) pH: 7.0   (Normal Range: 5.0-8.0) Protein: trace   (Normal Range: Negative) Urobilinogen: 0.2   (Normal Range: 0-1) Nitrite: positive   (Normal Range: Negative) Leukocyte Esterace: 1+   (Normal Range: Negative)    Comments: [name]  July 01, 2010 11:56 AM

## 2010-12-29 NOTE — Progress Notes (Signed)
Summary: ? re refills   Phone Note Call from Patient Call back at Home Phone [phone]   Caller: Patient Call For: Juanda Chance Reason for Call: Refill Medication, Talk to Nurse Summary of Call: Patient wants to speak to nurse regarding refills she received. Initial call taken by: Tawni Levy,  February 22, 2010 2:36 PM  Follow-up for Phone Call        questions answered. Follow-up by: Hortense Ramal CMA Duncan Dull),  February 22, 2010 3:32 PM

## 2010-12-29 NOTE — Progress Notes (Signed)
Summary: please call back  Phone Note Call from Patient Call back at [phone]   Caller: [facility] Summary of Call: triancinalone  0.5% cream . on manufacturer back order. ok to switch to ointment?  Initial call taken by: Warnell Forester,  May 16, 2010 1:28 PM  Follow-up for Phone Call        ok  Additional Follow-up for Phone Call Additional follow up Details #1::        pharmacy notified. Additional Follow-up by: Gladis Riffle, RN,  May 16, 2010 3:16 PM

## 2010-12-29 NOTE — Assessment & Plan Note (Signed)
Summary: B-12 INJ/CJR   Nurse Visit   Allergies: 1)  Morphine Sulfate (Morphine Sulfate) 2)  Sulfamethoxazole (Sulfamethoxazole) 3)  Tofranil (Imipramine Hcl) 4)  * Unna Boot  Medication Administration  Injection # 1:    Medication: Vit B12 1000 mcg    Diagnosis: B12 DEFICIENCY (ICD-266.2)    Route: IM    Site: L deltoid    Exp Date: 7/13    Lot #: [id number]    Mfr: [employer]    Patient tolerated injection without complications    Given by: Alfred Levins, CMA (December 21, 2010 11:36 AM)  Orders Added: 1)  Vit B12 1000 mcg [J3420] 2)  Admin of Therapeutic Inj  intramuscular or subcutaneous [57846]

## 2010-12-29 NOTE — Assessment & Plan Note (Signed)
Summary: continued constipation while taking Miralax/dm   Vital Signs:  Patient profile:   65 year old female Weight:      119 pounds Temp:     98.3 degrees F oral Pulse rate:   64 / minute Pulse rhythm:   regular Resp:     12 per minute BP sitting:   116 / 78  (left arm) Cuff size:   regular  Vitals Entered By: Gladis Riffle, RN (January 31, 2010 11:32 AM) CC: c/o constipation, takes miralax daily and took twice daily over the weekend--c/o back pain bilateral flank down midddle to hips and up to shoulders Is Patient Diabetic? No   CC:  c/o constipation and takes miralax daily and took twice daily over the weekend--c/o back pain bilateral flank down midddle to hips and up to shoulders.  History of Present Illness: continued constipation---2 small BMs last week no abdominal pain still with some back discomfort no radiation of pain no weakness  Started miralax---no significant results  Preventive Screening-Counseling & Management  Alcohol-Tobacco     Smoking Status: never  Current Medications (verified): 1)  Duragesic-50 50 Mcg/hr Pt72 (Fentanyl) .... Change Every 72 Hours As Directed 2)  Cyanocobalamin 1000 Mcg/ml Soln (Cyanocobalamin) .... Inject Every Three Months At Dr Cato Mulligan Office 3)  Clonazepam Odt 0.5 Mg  Tbdp (Clonazepam) .... One Two Times A Day As Needed 4)  Famotidine 20 Mg  Tabs (Famotidine) .... Take 1 Tab By Mouth At Bedtime 5)  Fludrocortisone Acetate 0.1 Mg  Tabs (Fludrocortisone Acetate) .... Take 1 Tablet By Mouth Once A Day 6)  Vitamin D 1000 Unit  Caps (Cholecalciferol) .... Once Daily 7)  Omeprazole 20 Mg  Cpdr (Omeprazole) .... Take 1 Capsule By Mouth Once A Day 8)  Klor-Con M20 20 Meq Cr-Tabs (Potassium Chloride Crys Cr) .... One Two Times A Day 9)  Ferrous Sulfate 325 (65 Fe) Mg Tabs (Ferrous Sulfate) .... Take 1 Tablet By Mouth Two Times A Day X 3 Months 10)  Alendronate Sodium 70 Mg  Tabs (Alendronate Sodium) .Marland Kitchen.. 1 By Mouth Q Week . Take With 8 Ounces  of Water On and Empty Stomach 11)  Cipro 500 Mg Tabs (Ciprofloxacin Hcl) .Marland Kitchen.. 1 By Mouth 2 Times Daily 12)  Trimethoprim 100 Mg Tabs (Trimethoprim) .... Take 1 Tablet By Mouth Once A Day. Start After Completing Cipro 13)  Miralax  Powd (Polyethylene Glycol 3350) .... As Needed  Allergies: 1)  Morphine Sulfate (Morphine Sulfate) 2)  Sulfamethoxazole (Sulfamethoxazole) 3)  Tofranil (Imipramine Hcl) 4)  * Unna Boot  Physical Exam  General:  Well-developed,well-nourished,in no acute distress; alert,appropriate and cooperative throughout examination Abdomen:  soft and non-tender.   Msk:  back tender to palpation Neurologic:  gait normal   Impression & Recommendations:  Problem # 1:  BACK PAIN (ICD-724.5) ? related to constipation add senokot if she starts to have relief from constipaiton and still has pain we will need to consider changing duragesic (may need MRI of back) Her updated medication list for this problem includes:    Duragesic-50 50 Mcg/hr Pt72 (Fentanyl) .Marland Kitchen... Change every 72 hours as directed  Complete Medication List: 1)  Duragesic-50 50 Mcg/hr Pt72 (Fentanyl) .... Change every 72 hours as directed 2)  Cyanocobalamin 1000 Mcg/ml Soln (Cyanocobalamin) .... Inject every three months at dr Henrine Hayter office 3)  Clonazepam Odt 0.5 Mg Tbdp (Clonazepam) .... One two times a day as needed 4)  Famotidine 20 Mg Tabs (Famotidine) .... Take 1 tab by mouth at  bedtime 5)  Fludrocortisone Acetate 0.1 Mg Tabs (Fludrocortisone acetate) .... Take 1 tablet by mouth once a day 6)  Vitamin D 1000 Unit Caps (Cholecalciferol) .... Once daily 7)  Omeprazole 20 Mg Cpdr (Omeprazole) .... Take 1 capsule by mouth once a day 8)  Klor-con M20 20 Meq Cr-tabs (Potassium chloride crys cr) .... One two times a day 9)  Ferrous Sulfate 325 (65 Fe) Mg Tabs (Ferrous sulfate) .... Take 1 tablet by mouth two times a day x 3 months 10)  Alendronate Sodium 70 Mg Tabs (Alendronate sodium) .Marland Kitchen.. 1 by mouth q week .  take with 8 ounces of water on and empty stomach 11)  Cipro 500 Mg Tabs (Ciprofloxacin hcl) .Marland Kitchen.. 1 by mouth 2 times daily 12)  Trimethoprim 100 Mg Tabs (Trimethoprim) .... Take 1 tablet by mouth once a day. start after completing cipro 13)  Miralax Powd (Polyethylene glycol 3350) .... As needed 14)  Senokot 8.6 Mg Tabs (Sennosides) .... Take 1 tablet by mouth three times a day hold for diarrhea  Patient Instructions: 1)  see me 4 weeks

## 2010-12-29 NOTE — Letter (Signed)
Summary: Patient Notice-Endo Biopsy Results  [facility]  [address] [place], [place] [address]   Phone: [phone]  Fax: [phone]        February 24, 2010 MRN: [id number]    [facility] [address] [place], [place]  [address]    Dear [name],  I am pleased to inform you that the biopsies taken during your recent endoscopic examination did not show any evidence of cancer upon pathologic examination.Biopsies from Your stomach showed normal tissue  Additional information/recommendations:  __No further action is needed at this time.  Please follow-up with      your primary care physician for your other healthcare needs.  __ Please call [phone] to schedule a return visit to review      your condition.  _x_ Continue with the treatment plan as outlined on the day of your      exam.     Please call us if you are having persistent problems or have questions about your condition that have not been fully answered at this time.  Sincerely,  Hart Carwin MD  This letter has been electronically signed by your physician.  Appended Document: Patient Notice-Endo Biopsy Results Letter mailed 4.1.11

## 2010-12-29 NOTE — Progress Notes (Signed)
Summary: change to nexium   ---- Converted from flag ---- ---- 02/24/2010 1:05 PM, Hart Carwin MD wrote: Karen Kitchens, can You, please send her Nexium 40 mg, # 60  1 by mouth two times a day, 3 refills?, She said they gave her omeprazole instead. ------------------------------     Follow-up for Phone Call       Follow-up by: Hortense Ramal CMA Duncan Dull),  February 24, 2010 2:02 PM    New/Updated Medications: NEXIUM 40 MG CPDR (ESOMEPRAZOLE MAGNESIUM) Take 1 tablet by mouth two times a day (pharmacy-please discontinue prescription previously sent for omeprazole) Prescriptions: NEXIUM 40 MG CPDR (ESOMEPRAZOLE MAGNESIUM) Take 1 tablet by mouth two times a day (pharmacy-please discontinue prescription previously sent for omeprazole)  #60 x 3   Entered by:   Hortense Ramal CMA (AAMA)   Authorized by:   Hart Carwin MD   Signed by:   Hortense Ramal CMA (AAMA) on 02/24/2010   Method used:   Electronically to        [facility] [address] (retail)       [address], [address]       [place], [place]  [address]       Ph: [phone]       Fax: [phone]   RxID:   [id number]

## 2010-12-29 NOTE — Miscellaneous (Signed)
Summary: rx carafate  Clinical Lists Changes  Medications: Added new medication of CARAFATE 1 GM  TABS (SUCRALFATE) 1 pill by mouth two times a day - Signed Rx of CARAFATE 1 GM  TABS (SUCRALFATE) 1 pill by mouth two times a day;  #60 x 1;  Signed;  Entered by: Sherren Kerns RN;  Authorized by: Hart Carwin MD;  Method used: Electronically to [facility] [address], [place], [place], [place]  [address], Ph: [phone], Fax: [phone] Observations: Added new observation of ALLERGY REV: Done (02/21/2010 16:28)    Prescriptions: CARAFATE 1 GM  TABS (SUCRALFATE) 1 pill by mouth two times a day  #60 x 1   Entered by:   Sherren Kerns RN   Authorized by:   Hart Carwin MD   Signed by:   Sherren Kerns RN on 02/21/2010   Method used:   Electronically to        [facility] [address] (retail)       [address], [address]       [place], [place]  [address]       Ph: [phone]       Fax: [phone]   RxID:   [id number]

## 2010-12-29 NOTE — Assessment & Plan Note (Signed)
Summary: f2y/pt seen in ed for chest pain      Allergies Added:   History of Present Illness: [name] is seen today at the request of Dr Cato Mulligan.  She has been having syncope and atypical SSCP.  I had seen her in the hospital back in 2008 when she was deathly ill from sepsis and had a cardiomyopathy.  I cannot find in the records a cath report.  She is presumed not to have CAD.  Her EF returned to normal by echo and the thought was she had Takasubo's DCM or decrease EF associated with sepsis.  She has been weak, with a 10 lb weight loss.  She passes out in church a lot especially when going from sitting to standing.  She is on florinef for postural hypotension.  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  It comes and goes wihout any particular insighting factors and not always associated with her syncope.    Current Problems (verified): 1)  Chest Pain  (ICD-786.50) 2)  Hypotension, Orthostatic  (ICD-458.0) 3)  Hyperlipidemia  (ICD-272.4) 4)  Myocardial Infarction  (ICD-410.90) 5)  Cad  (ICD-414.00) 6)  CHF  (ICD-428.0) 7)  Osteoporosis  (ICD-733.00) 8)  U T I  (ICD-599.0) 9)  Anemia-nos  (ICD-285.9) 10)  Gerd  (ICD-530.81) 11)  Ana Positive  (ICD-790.99) 12)  Dermatitis  (ICD-692.9) 13)  B12 Deficiency  (ICD-266.2) 14)  Hiatal Hernia  (ICD-553.3) 15)  Common Migraine  (ICD-346.10) 16)  Fibromyalgia  (ICD-729.1) 17)  Colonic Polyps, Hx of  (ICD-V12.72)  Current Medications (verified): 1)  Duragesic-50 50 Mcg/hr Pt72 (Fentanyl) .... Change Every 72 Hours As Directed 2)  Cyanocobalamin 1000 Mcg/ml Soln (Cyanocobalamin) .... Inject Every Three Months At Dr Cato Mulligan Office 3)  Clonazepam Odt 0.5 Mg  Tbdp (Clonazepam) .... One Two Times A Day As Needed 4)  Famotidine 20 Mg  Tabs (Famotidine) .... Take 1 Tab By Mouth At Bedtime 5)  Fludrocortisone Acetate 0.1 Mg  Tabs (Fludrocortisone Acetate) .... Take 1 Tablet By Mouth Once A Day 6)  Vitamin D 1000 Unit  Caps  (Cholecalciferol) .... Once Daily 7)  Omeprazole 20 Mg  Cpdr (Omeprazole) .... Take 1 Capsule By Mouth Once A Day 8)  Klor-Con M20 20 Meq Cr-Tabs (Potassium Chloride Crys Cr) .... One Two Times A Day 9)  Ferrous Sulfate 325 (65 Fe) Mg Tabs (Ferrous Sulfate) .... Take 1 Tablet By Mouth Two Times A Day X 3 Months 10)  Alendronate Sodium 70 Mg  Tabs (Alendronate Sodium) .Marland Kitchen.. 1 By Mouth Q Week . Take With 8 Ounces of Water On and Empty Stomach 11)  Cipro 500 Mg Tabs (Ciprofloxacin Hcl) .Marland Kitchen.. 1 By Mouth 2 Times Daily 12)  Trimethoprim 100 Mg Tabs (Trimethoprim) .... Take 1 Tablet By Mouth Once A Day. Start After Completing Cipro  Allergies (verified): 1)  Morphine Sulfate (Morphine Sulfate) 2)  Sulfamethoxazole (Sulfamethoxazole) 3)  Tofranil (Imipramine Hcl) 4)  * Unna Boot  Past History:  Past Medical History: Last updated: 01/25/2010 RSD--R upper extremity  recurrent rash-undiagnosed responsive to cellcept headache neuritic dermatitis??? B12 deficiency ANA 1:160 hiatal hernia e coli sepsis--ARDA--VDRF--subendococcal MI carotid defieciency  interstitial cystitis  previous sepsis Anemia-NOS  Past Surgical History: Last updated: 09/05/2006 Cholecystectomy Hysterectomy Oophorectomy cystoscopy  Family History: Last updated: 08/23/2009 mother---esophageal CA father deceased MI Family History Diabetes 1st degree relative---father lung CAncer---brother  Social History: Last updated: 11/10/2009 Married Never Smoked Alcohol use-no Regular exercise-no 4 kids---one with AIDs  Review  of Systems       Denies fever, malais, weight loss, blurry vision, decreased visual acuity, cough, sputum, SOB, hemoptysis, pleuritic pain, palpitaitons, heartburn, abdominal pain, melena, lower extremity edema, claudication, or rash. All other systems reviewed and negative  Vital Signs:  Patient profile:   65 year old female Height:      66 inches Weight:      119 pounds BMI:      19.28 Pulse rate:   65 / minute Resp:     12 per minute BP sitting:   115 / 72  (left arm) BP standing:   100 / 80  Vitals Entered By: Kem Parkinson (January 26, 2010 8:45 AM)  Physical Exam  General:  Affect appropriate Healthy:  appears stated age HEENT: normal Neck supple with no adenopathy JVP normal no bruits no thyromegaly Lungs clear with no wheezing and good diaphragmatic motion Heart:  S1/S2 no murmur,rub, gallop or click PMI normal Abdomen: benighn, BS positve, no tenderness, no AAA no bruit.  No HSM or HJR Distal pulses intact with no bruits No edema Neuro non-focal Skin warm and dry    Impression & Recommendations:  Problem # 1:  CHEST PAIN (ICD-786.50) Atypical with Normal ECG.  F/U dobutamine echo Orders: EKG w/ Interpretation (93000)  Problem # 2:  HYPOTENSION, ORTHOSTATIC (ICD-458.0) Continue florinef.  Ok to add midodrin if needed.  F/U Swords R/O other systemic diseases.   Orders: Dobutamine Echo (Dobutamine Echo)  Problem # 3:  HYPERLIPIDEMIA (ICD-272.4) Target LDL less than 130 given lack of vascular disease CHOL: 179 (12/19/2007)   LDL: 111 (12/19/2007)   HDL: 49.4 (12/19/2007)   TG: 94 (12/19/2007)  Patient Instructions: 1)  Your physician recommends that you schedule a follow-up appointment as needed 2)  Your physician has requested that you have a dobutamine echocardiogram.  For further information please visit [email or web address]  Please follow instruction sheet as given.   Echocardiogram Report  Procedure date:  01/26/2010  Findings:      NSR 65 Normal ECG

## 2010-12-29 NOTE — Assessment & Plan Note (Signed)
Summary: 6 WK ROV/NJR   Vital Signs:  Patient profile:   65 year old female Weight:      123 pounds Temp:     98.3 degrees F oral Pulse rate:   76 / minute Pulse rhythm:   regular Resp:     12 per minute BP sitting:   102 / 68  (left arm) Cuff size:   regular  Vitals Entered By: Gladis Riffle, RN (Apr 08, 2010 10:55 AM) CC: 6 week rov Is Patient Diabetic? No   CC:  6 week rov.  History of Present Illness:  Follow-Up Visit      [name] who presents for Follow-up visit.  The patient denies chest pain and palpitations.  Since the last visit the patient notes no new problems or concerns but has usual, chronic problems. Sates that when she cleans after urination that she thinks there may be some stool on tissue paper.  The patient reports taking meds as prescribed.  When questioned about possible medication side effects, the patient notes none.    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) 4)  Hx of Lupus  (ICD-710.0) 5)  Cardiomyopathy  (ICD-425.4) 6)  Chronic Pain Syndrome  (ICD-338.4) 7)  Congestive Heart Failure, Hx of  (ICD-V12.50) 8)  Hypotension, Orthostatic  (ICD-458.0) 9)  Hyperlipidemia  (ICD-272.4) 10)  Myocardial Infarction  (ICD-410.90) 11)  Osteoporosis  (ICD-733.00) 12)  U T I  (ICD-599.0) 13)  Anemia-nos  (ICD-285.9) 14)  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 Change Every 72 Hours As Directed 2)  Cyanocobalamin 1000 Mcg/ml Soln (Cyanocobalamin) .... Inject Every Three Months At  Dr Cato Mulligan Office 3)  Clonazepam Odt 0.5 Mg  Tbdp (Clonazepam) .... One Two Times A Day As Needed 4)  Fludrocortisone Acetate 0.1 Mg  Tabs (Fludrocortisone Acetate) .... Take 1 Tablet By Mouth Once A Day 5)  Vitamin D 1000 Unit  Caps (Cholecalciferol) .... Once Daily 6)  Nexium 40 Mg Cpdr (Esomeprazole Magnesium) .... Take 1 Tablet By Mouth Two Times A Day (Pharmacy-Please Discontinue Prescription Previously Sent For Omeprazole) 7)  Klor-Con M20 20 Meq Cr-Tabs (Potassium Chloride Crys Cr) .... One Two Times A Day 8)  Ferrous Sulfate 325 (65 Fe) Mg Tabs (Ferrous Sulfate) .... Take 1 Tablet By Mouth Two Times A Day X 3 Months 9)  Trimethoprim 100 Mg Tabs (Trimethoprim) .... Take 1 Tablet By Mouth Once A Day. 10)  Miralax  Powd (Polyethylene Glycol 3350) .... As Needed 11)  Senokot 8.6 Mg Tabs (Sennosides) .... Take 1 Tablet By Mouth Three Times A Day Hold For Diarrhea 12)  Carafate 1 Gm  Tabs (Sucralfate) .Marland Kitchen.. 1 Pill By Mouth Two Times A Day  Allergies: 1)  ! Cipro 2)  Morphine Sulfate (Morphine Sulfate) 3)  Sulfamethoxazole (Sulfamethoxazole) 4)  Tofranil (Imipramine Hcl) 5)  * Unna Boot  Physical Exam  General:  alert and well-developed.   Head:  normocephalic and atraumatic.   Eyes:  pupils equal and pupils round.   Neck:  No deformities,  masses, or tenderness noted. Chest Wall:  No deformities, masses, or tenderness noted. Lungs:  normal respiratory effort and no intercostal retractions.   Heart:  normal rate and regular rhythm.   Abdomen:  soft and active BS   Impression & Recommendations:  Problem # 1:  ABDOMINAL PAIN (ICD-789.00) she describes intense lower quadrant abdominal pain in context of possible fistual and recurrent UTI she needs CT abd and pelvis Orders: Radiology Referral (Radiology) Venipuncture ([address]) TLB-BMP (Basic Metabolic Panel-BMET) (80048-METABOL) TLB-CBC Platelet - w/Differential (85025-CBCD) TLB-Hepatic/Liver Function Pnl (80076-HEPATIC)  Problem #  2:  HYPOTENSION, ORTHOSTATIC (ICD-458.0) doing reasonably well on fludrocortisone  Problem # 3:  B12 DEFICIENCY (ICD-266.2) monthly injection  Complete Medication List: 1)  Duragesic-50 50 Mcg/hr Pt72 (Fentanyl) .... Change every 72 hours as directed 2)  Cyanocobalamin 1000 Mcg/ml Soln (Cyanocobalamin) .... Inject every three months at dr Sherrill [place] office 3)  Clonazepam Odt 0.5 Mg Tbdp (Clonazepam) .... One two times a day as needed 4)  Fludrocortisone Acetate 0.1 Mg Tabs (Fludrocortisone acetate) .... Take 1 tablet by mouth once a day 5)  Vitamin D 1000 Unit Caps (Cholecalciferol) .... Once daily 6)  Nexium 40 Mg Cpdr (Esomeprazole magnesium) .... Take 1 tablet by mouth two times a day (pharmacy-please discontinue prescription previously sent for omeprazole) 7)  Klor-con M20 20 Meq Cr-tabs (Potassium chloride crys cr) .... One two times a day 8)  Ferrous Sulfate 325 (65 Fe) Mg Tabs (Ferrous sulfate) .... Take 1 tablet by mouth two times a day x 3 months 9)  Trimethoprim 100 Mg Tabs (Trimethoprim) .... Take 1 tablet by mouth once a day. 10)  Miralax Powd (Polyethylene glycol 3350) .... As needed 11)  Senokot 8.6 Mg Tabs (Sennosides) .... Take 1 tablet by mouth three times a day hold for diarrhea 12)  Carafate 1 Gm Tabs (Sucralfate) .Marland Kitchen.. 1 pill by mouth two times a day Prescriptions: DURAGESIC-50 50 MCG/HR PT72 (FENTANYL) change every 72 hours as directed  #10 x 0   Entered and Authorized by:   [name] MD   Signed by:   [name] MD on 04/08/2010   Method used:   Print then Give to Patient   RxID:   [id number]

## 2010-12-29 NOTE — Progress Notes (Signed)
Summary: triamcinolone cream  Phone Note From Pharmacy   Caller: [facility] Summary of Call: just rec'd rx for triamcinolone cream - does not come  in 0.5% - pharmacist request to change to 0.25%. . Initial call taken by: Duard Brady LPN,  June 01, 4402 12:46 PM  Follow-up for Phone Call        ok given KIK  Follow-up by: Duard Brady LPN,  June 01, 4741 12:47 PM

## 2010-12-29 NOTE — Progress Notes (Signed)
Summary: Refill--Klonopin  Phone Note Refill Request Message from:  Fax from Pharmacy on August 24, 2010 4:50 PM  Refills Requested: Medication #1:  KLONOPIN 0.5 MG TABS two times a day. [facility] [address] Please advise.  Initial call taken by: Lucious Groves CMA,  August 24, 2010 4:50 PM  Follow-up for Phone Call        ok    Prescriptions: KLONOPIN 0.5 MG TABS (CLONAZEPAM) two times a day  #60 x 0   Entered by:   Lynann Beaver CMA   Authorized by:   [name] MD   Signed by:   Lynann Beaver CMA on 08/25/2010   Method used:   Telephoned to ...       [facility] (retail)       [address], [address]       [place], [place]  [address]       Ph: [phone]       Fax: [phone]   RxID:   [id number]

## 2010-12-29 NOTE — Discharge Summary (Signed)
Summary: Dysphagia, Deconditioning etc   [name], ORF                 ACCOUNT NO.:  [id number]   MEDICAL RECORD NO.:  [id number]          PATIENT TYPE:  IPS   LOCATION:  4004                         FACILITY:  [facility]   PHYSICIAN:  Ellwood Dense, M.D.   DATE OF BIRTH:  Dec 14, 1945   DATE OF ADMISSION:  01/10/2007  DATE OF DISCHARGE:  01/17/2007                               DISCHARGE SUMMARY   DISCHARGE DIAGNOSES:  1. Severe deconditioning secondary to Escherichia coli, urosepsis, and      Takotsubo cardiomyopathy.  2. Takotsubo cardiomyopathy with improved ejection fraction.  3. 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  4. Dysphagia, resolved.  5. Stress disorder.  6. Hypoglycemia secondary to tube feeds, resolved.   HISTORY OF PRESENT ILLNESS:  [name] is a 65 year old female with a  history of fibromyalgia, B12 deficiency, admitted January 31 with back  pain, fever and chills, noted to be dehydrated and septic secondary to  E. coli UTI.  Patient was intubated secondary to hypoxia requiring  treatment with IV fluids, as well as IV antibiotics.  Urine culture and  blood cultures positive for E. coli.  Patient also noted to have EKG  changes with elevated cardiac enzymes and Dr. Eden Emms was consulted for  treatment of acute MI.  TEE done showed EF of 20% with global  hypokinesis, most likely secondary to Takotsubo disease or form of  septic cardiomyopathy.  Patient was extubated February 8 and noted to  have issues with dysphagia with delay in swallowing residue.  She was  started on D2 diet, nectar thick liquids and currently with poor  tolerance of this.  A repeat echocardiogram done on February 14,  revealing moderate dilated LV, LVEF of 40-45%.  As for the issues  regarding chest pain, low-dose Coreg was added.  Cardiology feels  symptoms most likely musculoskeletal.  Therapies were initiated and  patient is noted to have weakness with buckling of lower  extremities, as  well as pain.  Has been able to ambulate a few feet.  Rehab consulted  for progressive therapies.   PAST MEDICAL HISTORY:  Significant for:  1. Reflex sympathetic dystrophy, right lower extremity, secondary to      foot fracture, and right upper extremity secondary to trauma.  2. Atrial deficiency.  3. Migraine headaches.  4. O2 immune rash.  5. Hysterectomy.  6. Mixed connective tissue disorder.  7. Bilateral salpingo-oophorectomy.  8. Cholecystectomy.  9. Stress disorder.  10.Chronic pain.  11.Cystitis with occasional treatment.   ALLERGIES:  NO KNOWN DRUG ALLERGIES.   FAMILY HISTORY:  Positive for coronary artery disease and cancer.   SOCIAL HISTORY:  Patient is married, lives in 1-level home, with 4 steps  at entry.  Husband retired and supportive.  A daughter and sisters who  can provide assistance past discharge.  Does not use any tobacco.  Does  not use any alcohol.  Family assists with housework.  Patient was  independent and driving prior to admission.   HOSPITAL COURSE:  [name] was admitted to rehab on January 10, 2007 for inpatient therapy to consists of PT/OT daily.  Past admission,  initially she was maintained on D2 diet, nectar thick liquids, noted to  have renal insufficiency and it was recommended to push fluids.  Her  stress disorder was managed with scheduled use of Klonopin.  The  patient's tube feeds had been discontinued and the patient was noted to  have some issues with hypoglycemia while on tube feeds.  Therefore, CBGs  were initially checked for first 72 hours, revealing blood sugars from  100-mid 110s.  Hemoglobin A1c was noted to be within normal range and  CBG checks were discontinued.  Labs checked during this admission,  includes hemoglobin A1c at 5.8.  Electrolytes revealed a sodium of 137,  potassium 4.0, chloride 99, CO2 31, BUN 20, creatinine 0.6, glucose 118.  CBC revealed hemoglobin 10.7, hematocrit 32.3, white  count 6.3,  platelets 356.  A repeat swallow evaluation was done, at bedside, and  patient was advanced to D3 nectar and strength and endurance were  improving.  A swallow study of February 19, showed resolution of  dysphagia with the patient being advanced to regular diet and liquids.   Patient has been participating in therapy, limited secondary complaints  of fatigue intermittently.  By the time of discharge, patient was at  supervision level for sit to stand transfers, able to ambulate 100 feet  with a rolling walker supervision, able to navigate 4 steps with rolling  walker with minimum assists.  Her dynamic standing balance was, for  challenging activities, impaired.  Patient was at supervision level for  self care needs.  Speech therapy evaluation shows speech and language  within functional limits.  It is mainly occasional delays and  hesitations in expression.  Patient also with midline cognitive deficits  in areas of walking, memory, taught, organization.  Initially, this was  within functional limits, by the time of discharge.  Patient will be  continue to receive further followup home health PT/OT by home health  past discharge.  Family to provide supervision for now.  Patient did  report some bladder pressure at time of discharge, felt this was similar  to symptoms she had prior to issues with UTI; therefore, patient had a  UA/UC sent off, results currently pending.  She was started on Cipro 250  mg b.i.d. for 7 days.  We will contact her, once cultures finalize, if  antibiotics need to be changed.  On January 17, 2007, patient is  discharged to home.   DISCHARGE MEDICATIONS:  1. Klonopin 0.5 mg p.o. every 12 hours.  2. Fentanyl patch 50 mcg change 3 days.  3. Lasix 20 mg every a.m.  4. Lyrica 50 mg every 8 hours.  5. Coreg 3.25 mg half p.o. b.i.d.  6. Lanoxin 0.35 mg a day.  7. Protein supplements with Ensure b.i.d.  8. Omeprazole 20 mg a day.  9. Multivitamin with  iron 1 per day. 10.Ultram 50 mg every 6-8 hours p.r.n. pain.  11.Cipro 250 mg b.i.d.   DIET:  Low salt.   ACTIVITY:  As tolerated with supervision.  No strenuous activity.  No  alcohol.  No smoking.  No driving.   SPECIAL INSTRUCTIONS:  Do not use Xanax, use Fentanyl patch 50 mcg an  hour for now.   FOLLOWUP:  With Dr. Cato Mulligan regarding further titration of Fentanyl patch  to home dose of 75.  Patient to follow up with Dr. Thomasena Edis as needed.  Follow up with Dr. Cato Mulligan February  27 at 1:20.  Follow up with Dr.  Dietrich Pates for treatment in 2-3 weeks.      Greg Cutter, P.A.    ______________________________  Ellwood Dense, M.D.    PP/MEDQ  D:  01/18/2007  T:  01/19/2007  Job:  [id number]   cc:   Valetta Mole. Cato Mulligan, MD  Gerrit Friends. Dietrich Pates, MD, Cristela Blue, M.D.

## 2010-12-29 NOTE — Assessment & Plan Note (Signed)
Summary: 2 wk rov/fu on wound/njr   Vital Signs:  Patient profile:   65 year old female Weight:      123 pounds BMI:     19.92 Temp:     98 degrees F oral Pulse rate:   76 / minute Pulse rhythm:   regular Resp:     12 per minute BP sitting:   94 / 60  (left arm) Cuff size:   regular  Vitals Entered By: Gladis Riffle, RN (May 19, 2010 8:45 AM) CC: 2 week rov and recheck legs--c/o feeling shaky, nervous, "lousy" x 2 days Is Patient Diabetic? No   CC:  2 week rov and recheck legs--c/o feeling shaky, nervous, and "lousy" x 2 days.  History of Present Illness: f/u rash has noted improvement in right leg (was wrapped)---other areas involved with rash also much improved   prednisone---makes me nervious, toleratinhg lower dose without difficulty  still on doxycycline.  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) 8)  Hx of Lupus  (ICD-710.0) 9)  Cardiomyopathy  (ICD-425.4) 10)  Chronic Pain Syndrome  (ICD-338.4) 11)  Congestive Heart Failure, Hx of  (ICD-V12.50) 12)  Hypotension, Orthostatic  (ICD-458.0) 13)  Hyperlipidemia  (ICD-272.4) 14)  Myocardial Infarction  (ICD-410.90) 15)  Osteoporosis  (ICD-733.00) 16)  U T I  (ICD-599.0) 17)  Anemia-nos  (ICD-285.9) 18)  Ana Positive  (ICD-790.99) 19)  Dermatitis  (ICD-692.9) 20)  B12 Deficiency  (ICD-266.2) 21)  Hiatal Hernia  (ICD-553.3) 22)  Fibromyalgia  (ICD-729.1) 23)  Colonic Polyps, Hx of  (ICD-V12.72)  Current Medications (verified): 1)  Duragesic-50 50 Mcg/hr Pt72 (Fentanyl) .... Change Every 72 Hours As  Directed 2)  Cyanocobalamin 1000 Mcg/ml Soln (Cyanocobalamin) .... Inject Every Three Months At Dr Cato Mulligan Office 3)  Nexium 40 Mg Cpdr (Esomeprazole Magnesium) .... Take 1 Tablet By Mouth Two Times A Day (Pharmacy-Please Discontinue Prescription Previously Sent For Omeprazole) 4)  Klor-Con M20 20 Meq Cr-Tabs (Potassium Chloride Crys Cr) .... One Two Times A Day 5)  Miralax  Powd (Polyethylene Glycol 3350) .... As Needed 6)  Senokot 8.6 Mg Tabs (Sennosides) .... Take 1 Tablet By Mouth Three Times A Day Hold For Diarrhea 7)  Carafate 1 Gm  Tabs (Sucralfate) .Marland Kitchen.. 1 Pill By Mouth Two Times A Day 8)  Prednisone 20 Mg Tabs (Prednisone) .... 1/2 By Mouth Once Daily or As Directed 9)  Hydroxyzine Hcl 25 Mg Tabs (Hydroxyzine Hcl) .... Take 1 Tablet By Mouth Three Times A Day As Needed Pruritis 10)  Triamcinolone Acetonide 0.5 % Crea (Triamcinolone Acetonide) .... Apply Bid To Affected Area  Allergies: 1)  ! Cipro 2)  Morphine Sulfate (Morphine Sulfate) 3)  Sulfamethoxazole (Sulfamethoxazole) 4)  Tofranil (Imipramine Hcl) 5)  * Unna Boot  Past History:  Past Medical History: Last updated: 01/25/2010 RSD--R upper extremity  recurrent rash-undiagnosed responsive to cellcept headache neuritic dermatitis??? B12 deficiency ANA 1:160 hiatal hernia e coli sepsis--ARDA--VDRF--subendococcal MI carotid defieciency  interstitial cystitis  previous sepsis Anemia-NOS  Past Surgical History: Last updated: 09/05/2006 Cholecystectomy Hysterectomy Oophorectomy cystoscopy  Family History: Last updated: 02/08/2010 mother---esophageal CA  father deceased MI Family History Diabetes 1st degree relative---father lung CAncer---brother Family History of Colitis: Sister Family History of Uterine Cancer: Sister Family History of Ovarian Cancer: Sister Family History of Breast Cancer: Sister Family History of Prostate Cancer: Father Family History of Heart Disease:   Social History: Last updated:  11/10/2009 Married Never Smoked Alcohol use-no Regular exercise-no 4 kids---one with AIDs  Risk Factors: Exercise: no (07/09/2007)  Risk Factors: Smoking Status: never (05/19/2010)  Physical Exam  General:  alert and well-developed.   Head:  normocephalic and atraumatic.   Skin:  erythematous areas on legs and arms left hand is swollen and erythematous  excoriations on legs, arms---all areas seem to be maturing no ulcers   Impression & Recommendations:  Problem # 1:  RASH-NONVESICULAR (ICD-782.1) improving continue same treatment Her updated medication list for this problem includes:    Triamcinolone Acetonide 0.5 % Crea (Triamcinolone acetonide) .Marland Kitchen... Apply bid to affected area  Complete Medication List: 1)  Duragesic-50 50 Mcg/hr Pt72 (Fentanyl) .... Change every 72 hours as directed 2)  Cyanocobalamin 1000 Mcg/ml Soln (Cyanocobalamin) .... Inject every three months at dr Fitzgerald Dun[place] office 3)  Nexium 40 Mg Cpdr (Esomeprazole magnesium) .... Take 1 tablet by mouth two times a day (pharmacy-please discontinue prescription previously sent for omeprazole) 4)  Klor-con M20 20 Meq Cr-tabs (Potassium chloride crys cr) .... One two times a day 5)  Miralax Powd (Polyethylene glycol 3350) .... As needed 6)  Senokot 8.6 Mg Tabs (Sennosides) .... Take 1 tablet by mouth three times a day hold for diarrhea 7)  Carafate 1 Gm Tabs (Sucralfate) .Marland Kitchen.. 1 pill by mouth two times a day 8)  Prednisone 20 Mg Tabs (Prednisone) .... 1/2 by mouth once daily or as directed 9)  Hydroxyzine Hcl 25 Mg Tabs (Hydroxyzine hcl) .... Take 1 tablet by mouth three times a day as needed pruritis 10)  Triamcinolone Acetonide 0.5 % Crea (Triamcinolone acetonide) .... Apply bid to affected area  Patient Instructions: 1)  rewrap legs every 3 days---continue to use triamcinolone Prescriptions: DURAGESIC-50 50 MCG/HR PT72 (FENTANYL) change every 72 hours as directed  #10 x 0   Entered and Authorized by:   [name] MD   Signed by:   [name] MD on 05/19/2010   Method used:   Print then Give to Patient   RxID:   [id number]

## 2010-12-29 NOTE — Letter (Signed)
Summary: EGD Instructions  [facility]  [address] [place], [place] [address]   Phone: [phone]  Fax: [phone]       [facility] Rozar    1946/02/08    MRN: [id number]       Procedure Day /Date: 02/21/10 Monday     Arrival Time: 2:30 pm       Procedure Time: 3:30 pm     Location of Procedure:                    _ x _ [facility] (4th Floor)  PREPARATION FOR ENDOSCOPY   On 02/21/10 THE DAY OF THE PROCEDURE:  1.   No solid foods, milk or milk products are allowed after midnight the night before your procedure.  2.   Do not drink anything colored red or purple.  Avoid juices with pulp.  No orange juice.  3.  You may drink clear liquids until 1:30 pm, which is 2 hours before your procedure.                                                                                                CLEAR LIQUIDS INCLUDE: Water Jello Ice Popsicles Tea (sugar ok, no milk/cream) Powdered fruit flavored drinks Coffee (sugar ok, no milk/cream) Gatorade Juice: apple, white grape, white cranberry  Lemonade Clear bullion, consomm, broth Carbonated beverages (any kind) Strained chicken noodle soup Hard Candy   MEDICATION INSTRUCTIONS  Unless otherwise instructed, you should take regular prescription medications with a small sip of water as early as possible the morning of your procedure.        '          OTHER INSTRUCTIONS  You will need a responsible adult at least 63 years of age to accompany you and drive you home.   This person must remain in the waiting room during your procedure.  Wear loose fitting clothing that is easily removed.  Leave jewelry and other valuables at home.  However, you may wish to bring a book to read or an iPod/MP3 player to listen to music as you wait for your procedure to start.  Remove all body piercing jewelry and leave at home.  Total time from sign-in until discharge is approximately 2-3 hours.  You should go  home directly after your procedure and rest.  You can resume normal activities the day after your procedure.  The day of your procedure you should not:   Drive   Make legal decisions   Operate machinery   Drink alcohol   Return to work  You will receive specific instructions about eating, activities and medications before you leave.    The above instructions have been reviewed and explained to me by   Hortense Ramal CMA Duncan Dull)  February 11, 2010 11:37 AM     I fully understand and can verbalize these instructions _____________________________ Date 02/11/10

## 2010-12-29 NOTE — Progress Notes (Signed)
Summary: REQ FOR REFILL [facility])  Phone Note Refill Request Call back at  [phone] Message from:  Patient on March 21, 2010 1:38 PM  Refills Requested: Medication #1:  DURAGESIC-50 50 MCG/HR PT72 change every 72 hours as directed   Notes: Pt can be reached at [phone] when ready for p/u. Initial call taken by: Debbra Riding,  March 21, 2010 1:39 PM Caller: Patient  Follow-up for Phone Call        see Rx.  Pt will be called when signed and ready. Follow-up by: Gladis Riffle, RN,  March 21, 2010 2:58 PM  Additional Follow-up for Phone Call Additional follow up Details #1::        Rx at front ready for pickup.  No answer at home and no machine at present.  Will call back.Gladis Riffle, RN  March 22, 2010 12:01 PM     Additional Follow-up for Phone Call Additional follow up Details #2::    Patient notified.  Follow-up by: Gladis Riffle, RN,  March 22, 2010 2:39 PM  Prescriptions: DURAGESIC-50 50 MCG/HR PT72 (FENTANYL) change every 72 hours as directed  #10 x 0   Entered by:   Gladis Riffle, RN   Authorized by:   [name] MD   Signed by:   Gladis Riffle, RN on 03/21/2010   Method used:   Print then Give to Patient   RxID:   [id number]

## 2010-12-29 NOTE — Miscellaneous (Signed)
Summary: rx omperazole  Clinical Lists Changes  Medications: Added new medication of OMEPRAZOLE 20 MG  CPDR (OMEPRAZOLE) 1 each day 30 minutes before meal - Signed Rx of OMEPRAZOLE 20 MG  CPDR (OMEPRAZOLE) 1 each day 30 minutes before meal;  #30 x 11;  Signed;  Entered by: Sherren Kerns RN;  Authorized by: Hart Carwin MD;  Method used: Electronically to [facility] [address], [place], [place], [place]  [address], Ph: [phone], Fax: [phone] Observations: Added new observation of ALLERGY REV: Done (02/21/2010 16:52)    Prescriptions: OMEPRAZOLE 20 MG  CPDR (OMEPRAZOLE) 1 each day 30 minutes before meal  #30 x 11   Entered by:   Sherren Kerns RN   Authorized by:   Hart Carwin MD   Signed by:   Sherren Kerns RN on 02/21/2010   Method used:   Electronically to        [facility] (retail)       [address]       [place], [place]  [address]       Ph: [phone]       Fax: [phone]   RxID:   [id number]

## 2010-12-29 NOTE — Assessment & Plan Note (Signed)
Summary: 6 week rov /et   Vital Signs:  Patient profile:   65 year old female Weight:      123 pounds BMI:     19.92 Pulse rate:   64 / minute Pulse rhythm:   regular Resp:     12 per minute BP sitting:   106 / 66  (left arm) Cuff size:   regular  Vitals Entered By: Gladis Riffle, RN (June 01, 2010 12:17 PM) CC: 6 week rov, wraps legs at home every three days Is Patient Diabetic? No   CC:  6 week rov and wraps legs at home every three days.  History of Present Illness: rash see previous note (recent) she has a long hx of an undiagnosed rash---eventually responded to cellcept current rash reminds her of previous rash rash is pruritic---mostly limited to legs  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) 8)  Hx of Lupus  (ICD-710.0) 9)  Cardiomyopathy  (ICD-425.4) 10)  Chronic Pain Syndrome  (ICD-338.4) 11)  Congestive Heart Failure, Hx of  (ICD-V12.50) 12)  Hypotension, Orthostatic  (ICD-458.0) 13)  Hyperlipidemia  (ICD-272.4) 14)  Myocardial Infarction  (ICD-410.90) 15)  Osteoporosis  (ICD-733.00) 16)  U T I  (ICD-599.0) 17)  Anemia-nos  (ICD-285.9) 18)  Ana Positive  (ICD-790.99) 19)  Dermatitis  (ICD-692.9) 20)  B12 Deficiency  (ICD-266.2) 21)  Hiatal Hernia  (ICD-553.3) 22)  Fibromyalgia  (ICD-729.1) 23)  Colonic Polyps, Hx of  (ICD-V12.72)  Current Medications (verified): 1)  Duragesic-50 50 Mcg/hr Pt72 (Fentanyl) .... Change Every 72 Hours As Directed 2)  Cyanocobalamin 1000 Mcg/ml Soln (Cyanocobalamin) .... Inject Every Three Months At Dr Cato Mulligan Office 3)   Nexium 40 Mg Cpdr (Esomeprazole Magnesium) .... Take 1 Tablet By Mouth Two Times A Day (Pharmacy-Please Discontinue Prescription Previously Sent For Omeprazole) 4)  Klor-Con M20 20 Meq Cr-Tabs (Potassium Chloride Crys Cr) .... One Two Times A Day 5)  Miralax  Powd (Polyethylene Glycol 3350) .... As Needed 6)  Senokot 8.6 Mg Tabs (Sennosides) .... Take 1 Tablet By Mouth Three Times A Day Hold For Diarrhea 7)  Prednisone 20 Mg Tabs (Prednisone) .... 1/2 By Mouth Once Daily or As Directed 8)  Hydroxyzine Hcl 25 Mg Tabs (Hydroxyzine Hcl) .... Take 1 Tablet By Mouth Three Times A Day As Needed Pruritis 9)  Triamcinolone Acetonide 0.5 % Crea (Triamcinolone Acetonide) .... Apply Bid To Affected Area  Allergies: 1)  ! Cipro 2)  Morphine Sulfate (Morphine Sulfate) 3)  Sulfamethoxazole (Sulfamethoxazole) 4)  Tofranil (Imipramine Hcl) 5)  * Unna Boot  Review of Systems       All other systems reviewed and were negative   Physical Exam  General:  alert and well-developed.   Head:  normocephalic and atraumatic.   Eyes:  pupils equal and pupils round.   Ears:  R ear normal and L ear normal.   Neck:  No deformities, masses, or tenderness noted. Lungs:  normal respiratory effort and no intercostal retractions.   Heart:  normal rate and regular rhythm.   Abdomen:  soft and non-tender.   Skin:  erythematous rash with multiple excoriations on legs   Impression & Recommendations:  Problem # 1:  RASH-NONVESICULAR (ICD-782.1) autoimmune most likely needs rx Cellcept side effects and risks discussed  Her updated medication list for this problem includes:    Triamcinolone Acetonide 0.5 % Crea (Triamcinolone acetonide) .Marland Kitchen... Apply bid to affected area  Complete Medication List: 1)  Duragesic-50 50 Mcg/hr Pt72 (Fentanyl) .... Change every 72 hours as directed 2)  Cyanocobalamin 1000 Mcg/ml Soln (Cyanocobalamin) .... Inject every three months at dr Doloros Kwolek office 3)  Nexium 40 Mg Cpdr (Esomeprazole  magnesium) .... Take 1 tablet by mouth two times a day (pharmacy-please discontinue prescription previously sent for omeprazole) 4)  Klor-con M20 20 Meq Cr-tabs (Potassium chloride crys cr) .... One two times a day 5)  Miralax Powd (Polyethylene glycol 3350) .... As needed 6)  Senokot 8.6 Mg Tabs (Sennosides) .... Take 1 tablet by mouth three times a day hold for diarrhea 7)  Prednisone 20 Mg Tabs (Prednisone) .... 1/2 by mouth once daily or as directed 8)  Triamcinolone Acetonide 0.5 % Crea (Triamcinolone acetonide) .... Apply bid to affected area 9)  Cellcept 500 Mg Tabs (Mycophenolate mofetil) .... Take 1 tablet by mouth once a day  Patient Instructions: 1)  see me tuesday Prescriptions: CELLCEPT 500 MG TABS (MYCOPHENOLATE MOFETIL) Take 1 tablet by mouth once a day  #30 x 3   Entered and Authorized by:   [name] MD   Signed by:   [name] MD on 06/01/2010   Method used:   Electronically to        [facility] [address] (retail)       [address], [address]       [place], [place]  [address]       Ph: [phone]       Fax: [phone]   RxID:   [id number] TRIAMCINOLONE ACETONIDE 0.5 % CREA (TRIAMCINOLONE ACETONIDE) apply bid to affected area  #30 grams x 3   Entered and Authorized by:   [name] MD   Signed by:   [name] MD on 06/01/2010   Method used:   Electronically to        [facility] [address] (retail)       [address], [address]       [place], [place]  [address]       Ph: [phone]       Fax: [phone]   RxID:   [id number]

## 2010-12-29 NOTE — Progress Notes (Signed)
Summary: Pt wants to pick up script for Duragesic ptch during inj wed   Phone Note Refill Request Call back at Home Phone [phone]   Refills Requested: Medication #1:  DURAGESIC-50 50 MCG/HR PT72 change every 72 hours as directed   Dosage confirmed as above?Dosage Confirmed   Supply Requested: 1 month Pt coming in for b-12 inj on wed 12/21/10 and would like to pick up script then.    Method Requested: Pick up at Office Initial call taken by: Lucy Antigua,  December 19, 2010 2:07 PM  Follow-up for Phone Call        rx up front ready for p/u, pt aware Follow-up by: Alfred Levins, CMA,  December 20, 2010 7:53 AM    Prescriptions: DURAGESIC-50 50 MCG/HR PT72 (FENTANYL) change every 72 hours as directed  #10 x 0   Entered by:   Alfred Levins, CMA   Authorized by:   [name] MD   Signed by:   Alfred Levins, CMA on 12/20/2010   Method used:   Print then Give to Patient   RxID:   [id number]

## 2010-12-29 NOTE — Assessment & Plan Note (Signed)
Summary: legs oozing, coming at 7:45 per MD/et   Vital Signs:  Patient profile:   65 year old female Pulse rate:   74 / minute Pulse rhythm:   regular Resp:     12 per minute BP sitting:   110 / 70  (left arm) Cuff size:   regular  Vitals Entered By: Gladis Riffle, RN (May 11, 2010 7:54 AM) CC: seen by dr Kirtland Bouchard last week, legs getting worse and oozing at right ankle, also spreading up both legs so beginning above knees Is Patient Diabetic? No   CC:  seen by dr Kirtland Bouchard last week, legs getting worse and oozing at right ankle, and also spreading up both legs so beginning above knees.  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 Change Every 72 Hours As Directed 2)  Cyanocobalamin 1000 Mcg/ml Soln (Cyanocobalamin) .... Inject Every Three Months At Dr Cato Mulligan Office 3)  Clonazepam Odt 0.5 Mg  Tbdp (Clonazepam) .... One Two Times A Day As Needed 4)  Fludrocortisone Acetate 0.1 Mg  Tabs (Fludrocortisone Acetate) .... Take 1 Tablet By Mouth Once A Day 5)  Vitamin D 1000 Unit  Caps (Cholecalciferol) .... Once Daily 6)  Nexium 40 Mg Cpdr (Esomeprazole Magnesium) .... Take 1 Tablet By Mouth Two Times A Day (Pharmacy-Please Discontinue Prescription Previously Sent For Omeprazole) 7)  Klor-Con M20 20 Meq Cr-Tabs (Potassium Chloride Crys Cr) .... One Two Times A Day 8)  Ferrous Sulfate 325 (65 Fe) Mg Tabs (Ferrous Sulfate) .... Take 1 Tablet By Mouth Two Times A Day X 3 Months 9)  Trimethoprim 100  Mg Tabs (Trimethoprim) .... Take 1 Tablet By Mouth Once A Day. 10)  Miralax  Powd (Polyethylene Glycol 3350) .... As Needed 11)  Senokot 8.6 Mg Tabs (Sennosides) .... Take 1 Tablet By Mouth Three Times A Day Hold For Diarrhea 12)  Carafate 1 Gm  Tabs (Sucralfate) .Marland Kitchen.. 1 Pill By Mouth Two Times A Day 13)  Cephalexin 500 Mg Caps (Cephalexin) .... One Twice Daily  Allergies: 1)  ! Cipro 2)  Morphine Sulfate (Morphine Sulfate) 3)  Sulfamethoxazole (Sulfamethoxazole) 4)  Tofranil (Imipramine Hcl) 5)  * Unna Boot  Past History:  Past Medical History: Last updated: 01/25/2010 RSD--R upper extremity  recurrent rash-undiagnosed responsive to cellcept headache neuritic dermatitis??? B12 deficiency ANA 1:160 hiatal hernia e coli sepsis--ARDA--VDRF--subendococcal MI carotid defieciency  interstitial cystitis  previous sepsis Anemia-NOS  Past Surgical History: Last updated: 09/05/2006 Cholecystectomy Hysterectomy Oophorectomy cystoscopy  Family History: Last updated: 02/08/2010 mother---esophageal CA father deceased MI Family History Diabetes 1st degree relative---father lung CAncer---brother Family History of Colitis: Sister Family History of Uterine Cancer: Sister Family History of Ovarian Cancer: Sister Family History of Breast Cancer: Sister Family History of Prostate Cancer: Father Family History of Heart Disease:   Social History: Last updated: 11/10/2009 Married Never Smoked Alcohol use-no Regular exercise-no 4 kids---one with AIDs  Risk Factors: Exercise: no (07/09/2007)  Risk Factors: Smoking Status: never (05/11/2010)  Physical Exam  General:  Well-developed,well-nourished,in no acute distress;  alert,appropriate and cooperative throughout examination Head:  normocephalic and atraumatic.   Eyes:  pupils equal and pupils round.   Skin:  purpuric rash on legs excoriations at left ankle serous drainage from excoriated site Inguinal Nodes:  no R  inguinal adenopathy and no L inguinal adenopathy.     Impression & Recommendations:  Problem # 1:  RASH-NONVESICULAR (ICD-782.1)  change antibiotic prednisone note hx of autoimmune rash---responded to cellcept side effects discussed allergic to unna boot leg wrapped with telfa and coban see me 2 days  Orders: Venipuncture ([address]) TLB-CBC Platelet - w/Differential (85025-CBCD) TLB-BMP (Basic Metabolic Panel-BMET) (80048-METABOL) TLB-Hepatic/Liver Function Pnl (80076-HEPATIC) TLB-Sedimentation Rate (ESR) (85652-ESR)  Complete Medication List: 1)  Duragesic-50 50 Mcg/hr Pt72 (Fentanyl) .... Change every 72 hours as directed 2)  Cyanocobalamin 1000 Mcg/ml Soln (Cyanocobalamin) .... Inject every three months at dr swords office 3)  Clonazepam Odt 0.5 Mg Tbdp (Clonazepam) .... One two times a day as needed 4)  Fludrocortisone Acetate 0.1 Mg Tabs (Fludrocortisone acetate) .... Take 1 tablet by mouth once a day 5)  Vitamin D 1000 Unit Caps (Cholecalciferol) .... Once daily 6)  Nexium 40 Mg Cpdr (Esomeprazole magnesium) .... Take 1 tablet by mouth two times a day (pharmacy-please discontinue prescription previously sent for omeprazole) 7)  Klor-con M20 20 Meq Cr-tabs (Potassium chloride crys cr) .... One two times a day 8)  Ferrous Sulfate 325 (65 Fe) Mg Tabs (Ferrous sulfate) .... Take 1 tablet by mouth two times a day x 3 months 9)  Trimethoprim 100 Mg Tabs (Trimethoprim) .... Take 1 tablet by mouth once a day. 10)  Miralax Powd (Polyethylene glycol 3350) .... As needed 11)  Senokot 8.6 Mg Tabs (Sennosides) .... Take 1 tablet by mouth three times a day hold for diarrhea 12)  Carafate 1 Gm Tabs (Sucralfate) .Marland Kitchen.. 1 pill by mouth two times a day 13)  Doxycycline Hyclate 100 Mg Caps (Doxycycline hyclate) .... Take 1 tab twice a day 14)  Prednisone 20 Mg Tabs (Prednisone) .... 2 by mouth once daily or as directed  Patient Instructions: 1)  see me Friday Prescriptions: PREDNISONE 20 MG  TABS (PREDNISONE) 2 by mouth once daily or as directed  #40 x 0   Entered and Authorized by:   [name] MD   Signed by:   [name] MD on 05/11/2010   Method used:   Electronically to        [facility] [address] (retail)       [address], [address]       [place], [place]  [address]       Ph: [phone]       Fax: [phone]   RxID:   [id number] DOXYCYCLINE HYCLATE 100 MG CAPS (DOXYCYCLINE HYCLATE) Take 1 tab twice a day  #20 x 0   Entered and Authorized by:   [name] MD   Signed by:   [name] MD on 05/11/2010   Method used:   Electronically to        [facility] [address] (retail)       [address], [address]       [place], [place]  [address]       Ph: [phone]       Fax: [phone]   RxID:   [id number]

## 2010-12-29 NOTE — Assessment & Plan Note (Signed)
Summary: GERD/SCREEN FOR ENDO/YF    History of Present Illness Visit Type: Initial Consult Primary GI MD: Lina Sar MD Requesting Provider: [name], MD Chief Complaint: Increase in acid reflux in the last 6 months. Pt states she also has solid food dysphagia and feeling of foods getting stuck in her chest. History of Present Illness:   This is a 65 year old white female with a decreased appetite and weight loss from 135 pounds in 2008 to 119 pounds today. She has difficulty swallowing mostly solids and has a history of gastroesophageal reflux. 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 She is being evaluated for heart disease by Dr.Nishan. She underwent an upper endoscopy by Dr.Earle in May 2004 which showed a small hiatal hernia and a mild esophageal stricture. She was dilated with a 56 [place] dilator. Her barium swallow was unremarkable. A CT Scan of the abdomen at that time was normal as was the most recent CT scan of the abdomen in October 2010. We did a colonoscopy in August 2009 because of her family history of colon cancer. Her exam was normal. Her mother died of esophageal cancer. She has been on omeprazole and Pepcid without much improvement of her indigestion which has become worse especially since she started Fosamax. The Fosamax was discontinued last week. She also has complaints of constipation which seems to be relieved by MiraLax. She says she is just not feeling well.   GI Review of Systems    Reports acid reflux, chest pain, dysphagia with solids, heartburn, and  loss of appetite.      Denies belching, bloating, dysphagia with liquids, nausea, vomiting, vomiting blood, weight loss, and  weight gain.      Reports constipation.     Denies anal fissure, black tarry stools, change in bowel habit, diarrhea, diverticulosis, fecal incontinence, heme positive stool, hemorrhoids, irritable bowel syndrome, jaundice, light color stool, liver  problems, rectal bleeding, and  rectal pain.    Current Medications (verified): 1)  Duragesic-50 50 Mcg/hr Pt72 (Fentanyl) .... Change Every 72 Hours As Directed 2)  Cyanocobalamin 1000 Mcg/ml Soln (Cyanocobalamin) .... Inject Every Three Months At Dr Cato Mulligan Office 3)  Clonazepam Odt 0.5 Mg  Tbdp (Clonazepam) .... One Two Times A Day As Needed 4)  Famotidine 20 Mg  Tabs (Famotidine) .... Take 1 Tab By Mouth At Bedtime 5)  Fludrocortisone Acetate 0.1 Mg  Tabs (Fludrocortisone Acetate) .... Take 1 Tablet By Mouth Once A Day 6)  Vitamin D 1000 Unit  Caps (Cholecalciferol) .... Once Daily 7)  Omeprazole 20 Mg  Cpdr (Omeprazole) .... Take 1 Capsule By Mouth Once A Day 8)  Klor-Con M20 20 Meq Cr-Tabs (Potassium Chloride Crys Cr) .... One Two Times A Day 9)  Ferrous Sulfate 325 (65 Fe) Mg Tabs (Ferrous Sulfate) .... Take 1 Tablet By Mouth Two Times A Day X 3 Months 10)  Alendronate Sodium 70 Mg  Tabs (Alendronate Sodium) .Marland Kitchen.. 1 By Mouth Q Week . Take With 8 Ounces of Water On and Empty Stomach 11)  Trimethoprim 100 Mg Tabs (Trimethoprim) .... Take 1 Tablet By Mouth Once A Day. Start After Completing Cipro 12)  Miralax  Powd (Polyethylene Glycol 3350) .... As Needed 13)  Senokot 8.6 Mg Tabs (Sennosides) .... Take 1 Tablet By Mouth Three Times A Day Hold For Diarrhea  Allergies (verified): 1)  ! Cipro 2)  Morphine Sulfate (Morphine Sulfate) 3)  Sulfamethoxazole (Sulfamethoxazole) 4)  Tofranil (Imipramine Hcl) 5)  * [employer]  Past History:  Past Medical History: Reviewed history from 01/25/2010 and no changes required. RSD--R upper extremity  recurrent rash-undiagnosed responsive to cellcept headache neuritic dermatitis??? B12 deficiency ANA 1:160 hiatal hernia e coli sepsis--ARDA--VDRF--subendococcal MI carotid defieciency  interstitial cystitis  previous sepsis Anemia-NOS  Past Surgical History: Reviewed history from 09/05/2006 and no changes  required. Cholecystectomy Hysterectomy Oophorectomy cystoscopy  Family History: Reviewed history from 02/08/2010 and no changes required. mother---esophageal CA father deceased MI Family History Diabetes 1st degree relative---father lung CAncer---brother Family History of Colitis: Sister Family History of Uterine Cancer: Sister Family History of Ovarian Cancer: Sister Family History of Breast Cancer: Sister Family History of Prostate Cancer: Father Family History of Heart Disease:   Social History: Reviewed history from 11/10/2009 and no changes required. Married Never Smoked Alcohol use-no Regular exercise-no 4 kids---one with AIDs  Review of Systems       The patient complains of back pain, itching, muscle pains/cramps, shortness of breath, skin rash, urination changes/pain, and urine leakage.  The patient denies allergy/sinus, anemia, anxiety-new, arthritis/joint pain, blood in urine, breast changes/lumps, change in vision, confusion, cough, coughing up blood, depression-new, fainting, fatigue, fever, headaches-new, hearing problems, heart murmur, heart rhythm changes, menstrual pain, night sweats, nosebleeds, pregnancy symptoms, sleeping problems, sore throat, swelling of feet/legs, swollen lymph glands, thirst - excessive , urination - excessive , vision changes, and voice change.         Pertinent positive and negative review of systems were noted in the above HPI. All other ROS was otherwise negative.   Vital Signs:  Patient profile:   65 year old female Height:      66 inches Weight:      119.13 pounds BMI:     19.30 Pulse rate:   64 / minute Pulse rhythm:   regular BP sitting:   112 / 60  (right arm) Cuff size:   regular  Vitals Entered By: Christie Nottingham CMA Duncan Dull) (February 11, 2010 10:25 AM)  Physical Exam  General:  thin, chronically ill-appearing female with sunken eyes and prominent cheekbones. Eyes:  PERRLA, no icterus. Mouth:  no thrush. Neck:   Supple; no masses or thyromegaly. Lungs:  Clear throughout to auscultation. Heart:  Regular rate and rhythm; no murmurs, rubs,  or bruits. Abdomen:  soft and diffusely tender abdomen in all quadrants without distention. Some tenderness in epigastrium. Normal right upper quadrant. The liver edge is at costal margin. Splenic tip not palpable. No ascites. Rectal:  soft Hemoccult negative stool. Extremities:  no edema. Skin:  no spider nevi. No bruit and contractures. No rash Psych:  patient appears depressed but she is alert and cooperative.   Impression & Recommendations:  Problem # 1:  GERD (ICD-530.81) Patient has a history of gastroesophageal reflux and esophageal stricture. She has had an exacerbation of the symptoms despite taking a proton pump inhibitor. We need to rule out a recurrent esophageal stricture or esophageal dysmotility due to her mixed connective tissue disease. We will give her Nexium 40 mg daily and schedule her for an upper endoscopy.  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 She has RSD, dry mouth and fibromyalgia. We need to rule out esophageal dysmotility  as part of her autoimmune disease.  Problem # 4:  COLONIC POLYPS, HX OF (ICD-V12.72) Patient has a family history of colon cancer. Her last colonoscopy in 2009 was normal. A prior colonoscopy in 2004 was normal as well. She will be due for a recall colonoscopy in August 2014.  Patient Instructions: 1)  Nexium 40 mg daily. 2)  Upper endoscopy and possible dilatation. If no stricture  found, we will proceed with a barium esophagram. 3)  Obtain labs today including a metabolic panel, iron studies, Z30, sedimentation rate, amylase, lipase and CBC.  4)  Patient may need an upper abdominal ultrasound to assess for splenomegaly. 5)  A recall colonoscopy will be due in August 2014. 6)  Continue MiraLax p.r.n. 7)  Copy sent to :Dr B.Swords 8)  The medication list was reviewed and reconciled.  All changed / newly prescribed medications were explained.  A complete medication list was provided to the patient / caregiver.

## 2010-12-29 NOTE — Progress Notes (Signed)
Summary: f/u?   Phone Note Call from Patient Call back at Home Phone [phone]   Caller: Patient Call For: Dr. Juanda Chance Reason for Call: Talk to Nurse Summary of Call: pt would like to know if she is needing to make any future f/u appts Initial call taken by: Vallarie Mare,  March 01, 2010 1:05 PM  Follow-up for Phone Call        Patient  said she had a conversation with Dr Juanda Chance and she asked her to schedule an office visit for 8 weeks.  Patient  advised that June schedule is not yet available.  She will call back in 3 weeks to scheule.  Follow-up by: Darcey Nora RN, CGRN,  March 01, 2010 1:28 PM

## 2010-12-29 NOTE — Progress Notes (Signed)
Summary: low BP  Phone Note Call from Patient   Caller: Patient Call For: [name] MD Summary of Call: BP dropped last night to 80/47.  Has not checked it today.  Pt complained of feeling weak and faint.  This morning it is 133/86.  Is asking if she should do anything differently?  Is stayng hydrated and eating and is using salt.  Pt is still taking Klonipin, and the chart states Dr. Cato Mulligan took her off????  [phone] Initial call taken by: Lynann Beaver CMA,  June 02, 2010 10:52 AM  Follow-up for Phone Call        stop hydroxizine Follow-up by: [name] MD,  June 02, 2010 4:29 PM  Additional Follow-up for Phone Call Additional follow up Details #1::        Pt notified. Additional Follow-up by: Lynann Beaver CMA,  June 02, 2010 4:42 PM

## 2010-12-29 NOTE — Assessment & Plan Note (Signed)
Summary: fu on tuesday/ok per doc/njr   Vital Signs:  Patient profile:   65 year old female Height:      66 inches (167.64 cm) Weight:      121 pounds (55.00 kg) Temp:     98.4 degrees F (36.89 degrees C) oral Pulse rate:   68 / minute BP sitting:   112 / 80  (left arm) Cuff size:   regular  Vitals Entered By: Josph Macho RMA (June 07, 2010 8:39 AM) CC: follow-up visit/ possible uti/ CF   Is Patient Diabetic? No   CC:  follow-up visit/ possible uti/ CF  .  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) 8)  Hx of Lupus  (ICD-710.0) 9)  Cardiomyopathy  (ICD-425.4) 10)  Chronic Pain Syndrome  (ICD-338.4) 11)  Congestive Heart Failure, Hx of  (ICD-V12.50) 12)  Hypotension, Orthostatic  (ICD-458.0) 13)  Hyperlipidemia  (ICD-272.4) 14)  Myocardial Infarction  (ICD-410.90) 15)  Osteoporosis  (ICD-733.00) 16)  U T I  (ICD-599.0) 17)  Anemia-nos  (ICD-285.9) 18)  Ana Positive  (ICD-790.99) 19)  Dermatitis  (ICD-692.9) 20)  B12 Deficiency  (ICD-266.2) 21)  Hiatal Hernia  (ICD-553.3) 22)  Fibromyalgia  (ICD-729.1) 23)  Colonic Polyps, Hx of  (ICD-V12.72)  Current Medications (verified): 1)  Duragesic-50 50 Mcg/hr Pt72 (Fentanyl) .... Change Every 72 Hours As Directed 2)  Cyanocobalamin 1000 Mcg/ml Soln (Cyanocobalamin) .... Inject Every Three Months At Dr Cato Mulligan Office 3)  Nexium 40 Mg Cpdr (Esomeprazole Magnesium) .... Take 1 Tablet By Mouth Two Times A Day (Pharmacy-Please Discontinue Prescription  Previously Sent For Omeprazole) 4)  Klor-Con M20 20 Meq Cr-Tabs (Potassium Chloride Crys Cr) .... One Two Times A Day 5)  Miralax  Powd (Polyethylene Glycol 3350) .... As Needed 6)  Senokot 8.6 Mg Tabs (Sennosides) .... Take 1 Tablet By Mouth Three Times A Day Hold For Diarrhea 7)  Prednisone 20 Mg Tabs (Prednisone) .... 1/2 By Mouth Once Daily or As Directed 8)  Triamcinolone Acetonide 0.5 % Crea (Triamcinolone Acetonide) .... Apply Bid To Affected Area 9)  Cellcept 500 Mg Tabs (Mycophenolate Mofetil) .... Take 1 Tablet By Mouth Once A Day  Allergies (verified): 1)  ! Cipro 2)  Morphine Sulfate (Morphine Sulfate) 3)  Sulfamethoxazole (Sulfamethoxazole) 4)  Tofranil (Imipramine Hcl) 5)  * Unna Boot  Past History:  Past Medical History: Last updated: 01/25/2010 RSD--R upper extremity  recurrent rash-undiagnosed responsive to cellcept headache neuritic dermatitis??? B12 deficiency ANA 1:160 hiatal hernia e coli sepsis--ARDA--VDRF--subendococcal MI carotid defieciency  interstitial cystitis  previous sepsis Anemia-NOS  Past Surgical History: Last updated: 09/05/2006 Cholecystectomy Hysterectomy Oophorectomy cystoscopy  Family History: Last updated: 02/08/2010 mother---esophageal CA father deceased MI Family History Diabetes 1st degree relative---father lung CAncer---brother Family History of Colitis: Sister Family History of Uterine Cancer: Sister Family History of Ovarian Cancer: Sister Family History of Breast Cancer: Sister Family History of Prostate Cancer: Father Family History of Heart Disease:   Social History: Last updated: 11/10/2009 Married Never Smoked Alcohol use-no Regular exercise-no 4  kids---one with AIDs  Risk Factors: Exercise: no (07/09/2007)  Risk Factors: Smoking Status: never (06/01/2010)  Physical Exam  General:  alert and well-developed.   Head:  atraumatic and no abnormalities observed.   Eyes:  pupils equal and pupils round.    Ears:  R ear normal and L ear normal.   Skin:  multiple areas of excoriation and erythema on legs.    Impression & Recommendations:  Problem # 1:  RASH-NONVESICULAR (ICD-782.1) improving stay on cellcept see me 2 weeks Her updated medication list for this problem includes:    Triamcinolone Acetonide 0.5 % Crea (Triamcinolone acetonide) .Marland Kitchen... Apply bid to affected area  Problem # 2:  DYSURIA (ICD-788.1)  check UA/culture  Her updated medication list for this problem includes:    Cephalexin 500 Mg Tabs (Cephalexin) .Marland Kitchen... Take one by mouth 3 times daily  Orders: UA Dipstick w/o Micro (automated)  (81003) T-Culture, Urine ([id number])  Complete Medication List: 1)  Duragesic-50 50 Mcg/hr Pt72 (Fentanyl) .... Change every 72 hours as directed 2)  Cyanocobalamin 1000 Mcg/ml Soln (Cyanocobalamin) .... Inject every three months at dr Lige Lakeman office 3)  Nexium 40 Mg Cpdr (Esomeprazole magnesium) .... Take 1 tablet by mouth two times a day (pharmacy-please discontinue prescription previously sent for omeprazole) 4)  Klor-con M20 20 Meq Cr-tabs (Potassium chloride crys cr) .... One two times a day 5)  Miralax Powd (Polyethylene glycol 3350) .... As needed 6)  Senokot 8.6 Mg Tabs (Sennosides) .... Take 1 tablet by mouth three times a day hold for diarrhea 7)  Prednisone 20 Mg Tabs (Prednisone) .... 1/2 by mouth once daily or as directed 8)  Triamcinolone Acetonide 0.5 % Crea (Triamcinolone acetonide) .... Apply bid to affected area 9)  Cellcept 500 Mg Tabs (Mycophenolate mofetil) .... Take 1 tablet by mouth once a day 10)  Cephalexin 500 Mg Tabs (Cephalexin) .... Take one by mouth 3 times daily Prescriptions: CEPHALEXIN 500 MG  TABS (CEPHALEXIN) take one by mouth 3 times daily  #30 x 0   Entered and Authorized by:   [name] MD   Signed by:   [name] MD on 06/07/2010   Method used:   Electronically to        [facility] (retail)       [address], [address]       [place], [place]  [address]       Ph: [phone]       Fax: [phone]   RxID:   [id number]   Appended Document: fu on tuesday/ok per doc/njr  Laboratory Results   Urine Tests    Routine Urinalysis   Color: yellow Appearance: Clear Glucose: negative   (Normal Range: Negative) Bilirubin: negative   (Normal Range: Negative) Ketone: negative   (Normal Range: Negative) Spec. Gravity: 1.020   (Normal Range: 1.003-1.035) Blood: trace-intact   (Normal Range: Negative) pH: 7.0   (Normal Range: 5.0-8.0) Protein: trace   (Normal Range: Negative) Urobilinogen: 0.2   (Normal Range: 0-1) Nitrite: negative   (Normal Range: Negative) Leukocyte Esterace: 1+negative   (Normal Range: Negative)    Comments: [name]  June 07, 2010 9:13 AM

## 2010-12-29 NOTE — Progress Notes (Signed)
Summary: syncope  Phone Note Call from Patient   Caller: Son Call For: [name] MD Summary of Call: Pt's son calls stating [place] are [place] and pt has passed out x 3 times.  Advised to call EMS and go to ER ASAP. Initial call taken by: Lynann Beaver CMA,  June 10, 2010 12:24 PM

## 2010-12-29 NOTE — Progress Notes (Signed)
Summary: Pt is req medication to put on sores on legs  Phone Note Call from Patient Call back at Home Phone [phone] Call back at [phone]   Caller: Patient Summary of Call: Pt came in to see Dr. Amador Cunas last wk re: sores on legs. Pt is req to get a medicine to go on sores. Pt says foot still swollen and sores are oozing. Please call in to [facility] [address] in [place].    Initial call taken by: Lucy Antigua,  May 10, 2010 1:48 PM  Follow-up for Phone Call        Spoke with pt.  She states legs are getting worse so are now oozing. States was told by pcp is from immune system.  c/o right foot swollen and itching also.  Had used neosporin samples given by Dr Kirtland Bouchard with no relief. Follow-up by: Gladis Riffle, RN,  May 10, 2010 3:10 PM  Additional Follow-up for Phone Call Additional follow up Details #1::        per dr swords make appt for 7:45 tomorrow.Patient notified.  Additional Follow-up by: Gladis Riffle, RN,  May 10, 2010 3:11 PM

## 2010-12-29 NOTE — Assessment & Plan Note (Signed)
Summary: rash/dm    Vital Signs:  Patient profile:   65 year old female Weight:      122 pounds Temp:     98.4 degrees F oral BP sitting:   120 / 80  (right arm) Cuff size:   regular  Vitals Entered By: Duard Brady LPN (May 05, 8118 11:32 AM) CC: c/o ?rash to (R) top ankle and (L) shin since tues PM Is Patient Diabetic? No   CC:  c/o ?rash to (R) top ankle and (L) shin since tues PM.  History of Present Illness: 65 year old patient who has multiple medical problems, including history of lupus, and autoimmune dermatitis.  For 3 days, she has noted a rash involving her lower extremities.  This is most marked at the dorsal surface of the right foot, near the ankle.  Medical problems include colopathy.  Chronic pain syndrome, and history of reflex sympathetic dystrophy  Preventive Screening-Counseling & Management  Alcohol-Tobacco     Smoking Status: never  Allergies: 1)  ! Cipro 2)  Morphine Sulfate (Morphine Sulfate) 3)  Sulfamethoxazole (Sulfamethoxazole) 4)  Tofranil (Imipramine Hcl) 5)  * Unna Boot  Past History:  Past Medical History: Reviewed history from 01/25/2010 and no changes required. RSD--R upper extremity  recurrent rash-undiagnosed responsive to cellcept headache neuritic dermatitis??? B12 deficiency ANA 1:160 hiatal hernia e coli sepsis--ARDA--VDRF--subendococcal MI carotid defieciency  interstitial cystitis  previous sepsis Anemia-NOS  Review of Systems       The patient complains of muscle weakness, suspicious skin lesions, and difficulty walking.  The patient denies anorexia, fever, weight loss, weight gain, vision loss, decreased hearing, hoarseness, chest pain, syncope, dyspnea on exertion, peripheral edema, prolonged cough, headaches, hemoptysis, abdominal pain, melena, hematochezia, severe indigestion/heartburn, hematuria, incontinence, genital sores, transient blindness, depression, unusual weight change, abnormal bleeding, enlarged  lymph nodes, angioedema, and breast masses.    Physical Exam  General:  Well-developed,well-nourished,in no acute distress; alert,appropriate and cooperative throughout examination Neck:  No deformities, masses, or tenderness noted. Lungs:  Normal respiratory effort, chest expands symmetrically. Lungs are clear to auscultation, no crackles or wheezes. Heart:  Normal rate and regular rhythm. S1 and S2 normal without gallop, murmur, click, rub or other extra sounds. Skin:  scattered nonblanching petechial type lesions were noted over the lower extremities.  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  She had erythema and appeared more to be a soft tissue cellulitis-type dermatitis   Impression & Recommendations:  Problem # 1:  CELLULITIS, FOOT, RIGHT (ICD-682.7)  Her updated medication list for this problem includes:    Trimethoprim 100 Mg Tabs (Trimethoprim) .Marland Kitchen... Take 1 tablet by mouth once a day.    Cephalexin 500 Mg Caps (Cephalexin) ..... One twice daily  Problem # 2:  ANA POSITIVE (ICD-790.99)  Problem # 3:  DERMATITIS (ICD-692.9)  Complete Medication List: 1)  Duragesic-50 50 Mcg/hr Pt72 (Fentanyl) .... Change every 72 hours as directed 2)  Cyanocobalamin 1000 Mcg/ml Soln (Cyanocobalamin) .... Inject every three months at dr swords office 3)  Clonazepam Odt 0.5 Mg Tbdp (Clonazepam) .... One two times a day as needed 4)  Fludrocortisone Acetate 0.1 Mg Tabs (Fludrocortisone acetate) .... Take 1 tablet by mouth once a day 5)  Vitamin D 1000 Unit Caps (Cholecalciferol) .... Once daily 6)  Nexium 40 Mg Cpdr (Esomeprazole magnesium) .... Take 1 tablet by mouth two times a day (pharmacy-please discontinue prescription previously sent for omeprazole)  7)  Klor-con M20 20 Meq Cr-tabs (Potassium chloride crys cr) .... One two times a day 8)  Ferrous Sulfate 325 (65 Fe) Mg Tabs (Ferrous sulfate) ....  Take 1 tablet by mouth two times a day x 3 months 9)  Trimethoprim 100 Mg Tabs (Trimethoprim) .... Take 1 tablet by mouth once a day. 10)  Miralax Powd (Polyethylene glycol 3350) .... As needed 11)  Senokot 8.6 Mg Tabs (Sennosides) .... Take 1 tablet by mouth three times a day hold for diarrhea 12)  Carafate 1 Gm Tabs (Sucralfate) .Marland Kitchen.. 1 pill by mouth two times a day 13)  Cephalexin 500 Mg Caps (Cephalexin) .... One twice daily  Patient Instructions: 1)  Take your antibiotic as prescribed until ALL of it is gone, but stop if you develop a rash or swelling and contact our office as soon as possible. 2)  Please schedule a follow-up appointment as needed. 3)  Please schedule a follow-up appointment in 2 weeks (Dr Cato Mulligan)

## 2010-12-29 NOTE — Medication Information (Signed)
Summary: Prior Authorization Request and Approval for Omeprazole  Prior Authorization Request and Approval for Omeprazole   Imported By: Maryln Gottron 01/13/2010 13:55:24  _____________________________________________________________________  External Attachment:    Type:   Image     Comment:   External Document

## 2010-12-29 NOTE — Assessment & Plan Note (Signed)
Summary: recheck from ER/dm   Vital Signs:  Patient profile:   65 year old female Weight:      118 pounds BMI:     19.11 Temp:     98.7 degrees F oral Pulse rate:   80 / minute Pulse rhythm:   regular Resp:     12 per minute BP sitting:   128 / 72  (left arm) Cuff size:   regular  Vitals Entered By: Gladis Riffle, RN (Mar 29, 2010 11:20 AM) CC: FU ER for syncope and UTI,  not given antibiotic but told to see pcp and urologist Is Patient Diabetic? No   CC:  FU ER for syncope and UTI and not given antibiotic but told to see pcp and urologist.  History of Present Illness: Recurrent Syncope: reviewed hospital note/records long hx of orthostasis she has no recurrent sxs told she had a UTI---reviewed record---culture with multiple morphologies.  All other systems reviewed and were negative   Preventive Screening-Counseling & Management  Alcohol-Tobacco     Smoking Status: never  Current Problems (verified): 1)  Dysphagia  (ICD-787.29) 2)  Gerd  (ICD-530.81) 3)  Weight Loss, Abnormal  (ICD-783.21) 4)  Anemia, Hx of  (ICD-V12.3) 5)  Gastritis, Chronic  (ICD-535.10) 6)  Reflex Sympathetic Dystrophy  (ICD-337.20) 7)  Hx of Lupus  (ICD-710.0) 8)  Cardiomyopathy  (ICD-425.4) 9)  Chronic Pain Syndrome  (ICD-338.4) 10)  Congestive Heart Failure, Hx of  (ICD-V12.50) 11)  Back Pain  (ICD-724.5) 12)  Chest Pain  (ICD-786.50) 13)  Hypotension, Orthostatic  (ICD-458.0) 14)  Hyperlipidemia  (ICD-272.4) 15)  Myocardial Infarction  (ICD-410.90) 16)  Cad  (ICD-414.00) 17)  CHF  (ICD-428.0) 18)  Osteoporosis  (ICD-733.00) 19)  U T I  (ICD-599.0) 20)  Anemia-nos  (ICD-285.9) 21)  Gerd  (ICD-530.81) 22)  Ana Positive  (ICD-790.99) 23)  Dermatitis  (ICD-692.9) 24)  B12 Deficiency  (ICD-266.2) 25)  Hiatal Hernia  (ICD-553.3) 26)  Common Migraine  (ICD-346.10) 27)  Fibromyalgia  (ICD-729.1) 28)  Colonic Polyps, Hx of  (ICD-V12.72)  Current Medications (verified): 1)  Duragesic-50 50  Mcg/hr Pt72 (Fentanyl) .... Change Every 72 Hours As Directed 2)  Cyanocobalamin 1000 Mcg/ml Soln (Cyanocobalamin) .... Inject Every Three Months At Dr Cato Mulligan Office 3)  Clonazepam Odt 0.5 Mg  Tbdp (Clonazepam) .... One Two Times A Day As Needed 4)  Fludrocortisone Acetate 0.1 Mg  Tabs (Fludrocortisone Acetate) .... Take 1 Tablet By Mouth Once A Day 5)  Vitamin D 1000 Unit  Caps (Cholecalciferol) .... Once Daily 6)  Nexium 40 Mg Cpdr (Esomeprazole Magnesium) .... Take 1 Tablet By Mouth Two Times A Day (Pharmacy-Please Discontinue Prescription Previously Sent For Omeprazole) 7)  Klor-Con M20 20 Meq Cr-Tabs (Potassium Chloride Crys Cr) .... One Two Times A Day 8)  Ferrous Sulfate 325 (65 Fe) Mg Tabs (Ferrous Sulfate) .... Take 1 Tablet By Mouth Two Times A Day X 3 Months 9)  Trimethoprim 100 Mg Tabs (Trimethoprim) .... Take 1 Tablet By Mouth Once A Day. 10)  Miralax  Powd (Polyethylene Glycol 3350) .... As Needed 11)  Senokot 8.6 Mg Tabs (Sennosides) .... Take 1 Tablet By Mouth Three Times A Day Hold For Diarrhea 12)  Carafate 1 Gm  Tabs (Sucralfate) .Marland Kitchen.. 1 Pill By Mouth Two Times A Day  Allergies: 1)  ! Cipro 2)  Morphine Sulfate (Morphine Sulfate) 3)  Sulfamethoxazole (Sulfamethoxazole) 4)  Tofranil (Imipramine Hcl) 5)  * Unna Boot  Past History:  Past Medical History:  Last updated: 01/25/2010 RSD--R upper extremity  recurrent rash-undiagnosed responsive to cellcept headache neuritic dermatitis??? B12 deficiency ANA 1:160 hiatal hernia e coli sepsis--ARDA--VDRF--subendococcal MI carotid defieciency  interstitial cystitis  previous sepsis Anemia-NOS  Past Surgical History: Last updated: 09/05/2006 Cholecystectomy Hysterectomy Oophorectomy cystoscopy  Family History: Last updated: 02/08/2010 mother---esophageal CA father deceased MI Family History Diabetes 1st degree relative---father lung CAncer---brother Family History of Colitis: Sister Family History of Uterine  Cancer: Sister Family History of Ovarian Cancer: Sister Family History of Breast Cancer: Sister Family History of Prostate Cancer: Father Family History of Heart Disease:   Social History: Last updated: 11/10/2009 Married Never Smoked Alcohol use-no Regular exercise-no 4 kids---one with AIDs  Risk Factors: Exercise: no (07/09/2007)  Risk Factors: Smoking Status: never (03/29/2010)  Physical Exam  General:  Well-developed,well-nourished,in no acute distress; alert,appropriate and cooperative throughout examination. she appears well  Head:  normocephalic and atraumatic.   Eyes:  pupils equal and pupils round.   Ears:  R ear normal and L ear normal.   Nose:  no external deformity and no external erythema.   Neck:  No deformities, masses, or tenderness noted. Chest Wall:  No deformities, masses, or tenderness noted. Lungs:  normal respiratory effort and no intercostal retractions.   Heart:  normal rate and regular rhythm.   Abdomen:  soft and non-tender.   Msk:  No deformity or scoliosis noted of thoracic or lumbar spine.   Neurologic:  cranial nerves II-XII intact and gait normal.     Impression & Recommendations:  Problem # 1:  SYNCOPE (ICD-780.2) reviewed hospital records discussed need for early recognition (she does get an "aura") continue current medications   Problem # 2:  GERD (ICD-530.81) controlled continue current medications  The following medications were removed from the medication list:    Nexium 40 Mg Cpdr (Esomeprazole magnesium) .Marland Kitchen... Take 1 tablet by mouth two times a day (pharmacy-please discontinue prescription previously sent for omeprazole)    Carafate 1 Gm Tabs (Sucralfate) .Marland Kitchen... 1 pill by mouth two times a day  Problem # 3:  U T I (ICD-599.0) recurrent suppressive therapy.  discussed at length---no change in meds at this time.  Her updated medication list for this problem includes:    Trimethoprim 100 Mg Tabs (Trimethoprim) .Marland Kitchen... Take 1  tablet by mouth once a day.  Orders: UA Dipstick w/o Micro (automated)  (81003) T-Culture, Urine ([id number])  Complete Medication List: 1)  Duragesic-50 50 Mcg/hr Pt72 (Fentanyl) .... Change every 72 hours as directed 2)  Cyanocobalamin 1000 Mcg/ml Soln (Cyanocobalamin) .... Inject every three months at dr Yahel Fuston office 3)  Clonazepam Odt 0.5 Mg Tbdp (Clonazepam) .... One two times a day as needed 4)  Fludrocortisone Acetate 0.1 Mg Tabs (Fludrocortisone acetate) .... Take 1 tablet by mouth once a day 5)  Vitamin D 1000 Unit Caps (Cholecalciferol) .... Once daily 6)  Nexium 40 Mg Cpdr (Esomeprazole magnesium) .... Take 1 tablet by mouth two times a day (pharmacy-please discontinue prescription previously sent for omeprazole) 7)  Klor-con M20 20 Meq Cr-tabs (Potassium chloride crys cr) .... One two times a day 8)  Ferrous Sulfate 325 (65 Fe) Mg Tabs (Ferrous sulfate) .... Take 1 tablet by mouth two times a day x 3 months 9)  Trimethoprim 100 Mg Tabs (Trimethoprim) .... Take 1 tablet by mouth once a day. 10)  Miralax Powd (Polyethylene glycol 3350) .... As needed 11)  Senokot 8.6 Mg Tabs (Sennosides) .... Take 1 tablet by mouth three times a day hold for  diarrhea 12)  Carafate 1 Gm Tabs (Sucralfate) .Marland Kitchen.. 1 pill by mouth two times a day  Patient Instructions: 1)  see me 2-4 weeks  Laboratory Results   Urine Tests    Routine Urinalysis   Color: yellow Appearance: Clear Glucose: negative   (Normal Range: Negative) Bilirubin: negative   (Normal Range: Negative) Ketone: negative   (Normal Range: Negative) Spec. Gravity: 1.010   (Normal Range: 1.003-1.035) Blood: trace-lysed   (Normal Range: Negative) pH: 7.0   (Normal Range: 5.0-8.0) Protein: negative   (Normal Range: Negative) Urobilinogen: 0.2   (Normal Range: 0-1) Nitrite: negative   (Normal Range: Negative) Leukocyte Esterace: 1+   (Normal Range: Negative)    Comments: [name]  Mar 29, 2010 11:29 AM

## 2011-01-03 ENCOUNTER — Ambulatory Visit (INDEPENDENT_AMBULATORY_CARE_PROVIDER_SITE_OTHER): Payer: Medicare Other | Admitting: Internal Medicine

## 2011-01-03 ENCOUNTER — Encounter: Payer: Self-pay | Admitting: Internal Medicine

## 2011-01-03 ENCOUNTER — Other Ambulatory Visit: Payer: Self-pay | Admitting: Internal Medicine

## 2011-01-03 VITALS — BP 102/66 | HR 68 | Temp 98.6°F | Wt 176.0 lb

## 2011-01-03 DIAGNOSIS — N390 Urinary tract infection, site not specified: Secondary | ICD-10-CM | POA: Insufficient documentation

## 2011-01-03 DIAGNOSIS — I951 Orthostatic hypotension: Secondary | ICD-10-CM

## 2011-01-03 DIAGNOSIS — R55 Syncope and collapse: Secondary | ICD-10-CM

## 2011-01-03 HISTORY — DX: Urinary tract infection, site not specified: N39.0

## 2011-01-03 HISTORY — DX: Syncope and collapse: R55

## 2011-01-03 LAB — POCT URINALYSIS DIPSTICK
Blood, UA: NEGATIVE
Glucose, UA: NEGATIVE
Urobilinogen, UA: 0.2

## 2011-01-03 LAB — CK TOTAL AND CKMB (NOT AT ARMC)
CK, MB: 0.8 ng/mL (ref 0.3–4.0)
Total CK: 59 U/L (ref 7–177)

## 2011-01-03 LAB — CBC WITH DIFFERENTIAL/PLATELET
Basophils Absolute: 0 10*3/uL (ref 0.0–0.1)
Eosinophils Absolute: 0.1 10*3/uL (ref 0.0–0.7)
Hemoglobin: 11.3 g/dL — ABNORMAL LOW (ref 12.0–15.0)
Lymphocytes Relative: 48.7 % — ABNORMAL HIGH (ref 12.0–46.0)
MCHC: 33.8 g/dL (ref 30.0–36.0)
Monocytes Relative: 7.2 % (ref 3.0–12.0)
Neutrophils Relative %: 41.7 % — ABNORMAL LOW (ref 43.0–77.0)
Platelets: 158 10*3/uL (ref 150.0–400.0)
RDW: 13.9 % (ref 11.5–14.6)

## 2011-01-03 LAB — BASIC METABOLIC PANEL
BUN: 22 mg/dL (ref 6–23)
CO2: 29 mEq/L (ref 19–32)
Calcium: 9.8 mg/dL (ref 8.4–10.5)
Creatinine, Ser: 0.8 mg/dL (ref 0.4–1.2)
GFR: 77.12 mL/min (ref >60.00)
Glucose, Bld: 100 mg/dL — ABNORMAL HIGH (ref 70–99)
Sodium: 142 mEq/L (ref 135–145)

## 2011-01-03 MED ORDER — CIPROFLOXACIN HCL 500 MG PO TABS: 500.0000 mg | ORAL_TABLET | Freq: Two times a day (BID) | ORAL | Status: AC

## 2011-01-03 NOTE — Progress Notes (Signed)
  Subjective:    Patient ID: [name], female    DOB: [birth year], 65 y.o.   MRN: [id number]  HPI Long hx of recurrent syncope Has had 3 episodes in the past 2 days---she wonders if related to stress (dtr has had reccurent seizures)  she has required Florinef in the past due to severe hypotension. She has done fairly well without [place] for the past several months or longer. In regards to recurrent syncopal episodes she does admit to some lightheadedness prior. Lightheadedness was in the context of severe distress. She denies any associated symptoms such as chest pain, shortness of breath, PND, seizure activity. She apparently was evaluated by Emt   Was not transfer the hospital.   Review of Systems  in addition to the above symptoms patient has chronic fatigue, chronic aches and pains. None of these have changed. She has no other specific complaints in a complete review of systems.    Objective:   Physical Exam     Well-developed, well-nourished female in no acute distress. Mildly pale. HEENT exam atraumatic, normocephalic, neck supple without carotid bruit or jugular venous decision. Chest clear to auscltation cardiac exam S1-S2 are regular. No stomach muscles, soft her extremities no edema.    Assessment & Plan:

## 2011-01-05 LAB — URINE CULTURE: Colony Count: NO GROWTH

## 2011-01-05 NOTE — Assessment & Plan Note (Signed)
Check urine culture  

## 2011-01-05 NOTE — Assessment & Plan Note (Signed)
Likely the cause of recurrent syncope. We'll check laboratories today.

## 2011-01-05 NOTE — Assessment & Plan Note (Signed)
Unclear etiology. I suspect vasovagal. She's not had syncope in the last couple of days. I don't think I will further evaluate significantly at this time and I will not resume Florinef. I've asked her to maintain hydration. In the previous years syncope has sometimes been associated with urinary tract infection and she does have a history of urinary tract infections. I'll check a urine culture.

## 2011-01-12 ENCOUNTER — Telehealth: Payer: Self-pay | Admitting: Internal Medicine

## 2011-01-12 DIAGNOSIS — R52 Pain, unspecified: Secondary | ICD-10-CM

## 2011-01-12 MED ORDER — FENTANYL 50 MCG/HR TD PT72: 1.0000 | MEDICATED_PATCH | TRANSDERMAL | Status: DC

## 2011-01-12 NOTE — Telephone Encounter (Signed)
Pt called and is req a script for Duragesic Patch 50mg . Pt would like to pick up script today around 2pm.

## 2011-01-12 NOTE — Telephone Encounter (Signed)
Pt can pick up in the morning cause Dr Cato Mulligan has left for the day and needs to sign rx.  Pt aware

## 2011-01-31 ENCOUNTER — Other Ambulatory Visit: Payer: Self-pay | Admitting: *Deleted

## 2011-01-31 DIAGNOSIS — F419 Anxiety disorder, unspecified: Secondary | ICD-10-CM

## 2011-01-31 MED ORDER — CLONAZEPAM 0.5 MG PO TABS: 0.5000 mg | ORAL_TABLET | Freq: Two times a day (BID) | ORAL | Status: DC | PRN

## 2011-02-01 ENCOUNTER — Telehealth: Payer: Self-pay | Admitting: Internal Medicine

## 2011-02-01 NOTE — Telephone Encounter (Signed)
Pt needs a refill on med: Klonopin..... [facility] - [address], [place].

## 2011-02-01 NOTE — Telephone Encounter (Signed)
rx was faxed on 01/31/11, pt aware

## 2011-02-08 LAB — CBC
HCT: 32.1 % — ABNORMAL LOW (ref 36.0–46.0)
Hemoglobin: 10.5 g/dL — ABNORMAL LOW (ref 12.0–15.0)
MCHC: 32.7 g/dL (ref 30.0–36.0)
MCV: 83.6 fL (ref 78.0–100.0)
RDW: 14.1 % (ref 11.5–15.5)

## 2011-02-08 LAB — POCT CARDIAC MARKERS: Troponin i, poc: <0.05 ng/mL (ref 0.00–0.09)

## 2011-02-08 LAB — POCT I-STAT, CHEM 8
BUN: 16 mg/dL (ref 6–23)
Calcium, Ion: 1.25 mmol/L (ref 1.12–1.32)
Chloride: 107 mEq/L (ref 96–112)
Glucose, Bld: 101 mg/dL — ABNORMAL HIGH (ref 70–99)
TCO2: 26 mmol/L (ref 0–100)

## 2011-02-08 LAB — URINALYSIS, ROUTINE W REFLEX MICROSCOPIC
Glucose, UA: NEGATIVE mg/dL
Ketones, ur: NEGATIVE mg/dL
Leukocytes, UA: NEGATIVE
Nitrite: NEGATIVE
Specific Gravity, Urine: 1.009 (ref 1.005–1.030)
pH: 6 (ref 5.0–8.0)

## 2011-02-08 LAB — DIFFERENTIAL
Basophils Absolute: 0 10*3/uL (ref 0.0–0.1)
Basophils Relative: 0 % (ref 0–1)
Eosinophils Relative: 2 % (ref 0–5)
Lymphocytes Relative: 34 % (ref 12–46)
Monocytes Absolute: 0.4 10*3/uL (ref 0.1–1.0)
Monocytes Relative: 10 % (ref 3–12)
Neutro Abs: 2 10*3/uL (ref 1.7–7.7)

## 2011-02-10 ENCOUNTER — Telehealth: Payer: Self-pay | Admitting: Internal Medicine

## 2011-02-10 NOTE — Telephone Encounter (Signed)
Pt needs new rx for duragesic 50-74mcg. Pt will be in town Tuesday 02-14-2011

## 2011-02-12 LAB — BASIC METABOLIC PANEL
BUN: 6 mg/dL (ref 6–23)
Calcium: 8.3 mg/dL — ABNORMAL LOW (ref 8.4–10.5)
Creatinine, Ser: 0.57 mg/dL (ref 0.4–1.2)
GFR calc non Af Amer: >60 mL/min (ref >60)
Glucose, Bld: 112 mg/dL — ABNORMAL HIGH (ref 70–99)
Sodium: 145 mEq/L (ref 135–145)

## 2011-02-12 LAB — DIFFERENTIAL
Basophils Absolute: 0 10*3/uL (ref 0.0–0.1)
Eosinophils Relative: 3 % (ref 0–5)
Lymphocytes Relative: 40 % (ref 12–46)
Neutro Abs: 1.7 10*3/uL (ref 1.7–7.7)
Neutrophils Relative %: 50 % (ref 43–77)

## 2011-02-12 LAB — CARDIAC PANEL(CRET KIN+CKTOT+MB+TROPI)
CK, MB: 1.1 ng/mL (ref 0.3–4.0)
Relative Index: INVALID (ref 0.0–2.5)
Total CK: 39 U/L (ref 7–177)
Total CK: 44 U/L (ref 7–177)
Troponin I: 0.1 ng/mL — ABNORMAL HIGH (ref 0.00–0.06)

## 2011-02-12 LAB — CBC
HCT: 33.4 % — ABNORMAL LOW (ref 36.0–46.0)
Hemoglobin: 10.4 g/dL — ABNORMAL LOW (ref 12.0–15.0)
MCV: 83.8 fL (ref 78.0–100.0)
Platelets: 138 10*3/uL — ABNORMAL LOW (ref 150–400)
RBC: 3.99 MIL/uL (ref 3.87–5.11)
RDW: 13.5 % (ref 11.5–15.5)
WBC: 3.4 10*3/uL — ABNORMAL LOW (ref 4.0–10.5)

## 2011-02-12 LAB — LIPASE, BLOOD: Lipase: 14 U/L (ref 11–59)

## 2011-02-12 LAB — CK TOTAL AND CKMB (NOT AT ARMC)
CK, MB: 0.7 ng/mL (ref 0.3–4.0)
Total CK: 46 U/L (ref 7–177)

## 2011-02-12 LAB — URINALYSIS, ROUTINE W REFLEX MICROSCOPIC
Bilirubin Urine: NEGATIVE
Glucose, UA: NEGATIVE mg/dL
Hgb urine dipstick: NEGATIVE
Protein, ur: NEGATIVE mg/dL
Specific Gravity, Urine: 1.02 (ref 1.005–1.030)

## 2011-02-12 LAB — COMPREHENSIVE METABOLIC PANEL
BUN: 15 mg/dL (ref 6–23)
CO2: 29 mEq/L (ref 19–32)
Chloride: 104 mEq/L (ref 96–112)
Creatinine, Ser: 0.6 mg/dL (ref 0.4–1.2)
GFR calc non Af Amer: >60 mL/min (ref >60)
Total Bilirubin: 0.2 mg/dL — ABNORMAL LOW (ref 0.3–1.2)

## 2011-02-12 LAB — URINE CULTURE

## 2011-02-13 NOTE — Telephone Encounter (Signed)
ok 

## 2011-02-14 ENCOUNTER — Ambulatory Visit: Payer: Self-pay | Admitting: Internal Medicine

## 2011-02-14 ENCOUNTER — Other Ambulatory Visit: Payer: Self-pay

## 2011-02-14 DIAGNOSIS — R52 Pain, unspecified: Secondary | ICD-10-CM

## 2011-02-14 LAB — POCT CARDIAC MARKERS
CKMB, poc: <1 ng/mL — ABNORMAL LOW (ref 1.0–8.0)
CKMB, poc: <1 ng/mL — ABNORMAL LOW (ref 1.0–8.0)
Myoglobin, poc: 44.4 ng/mL (ref 12–200)
Myoglobin, poc: 56.9 ng/mL (ref 12–200)
Troponin i, poc: <0.05 ng/mL (ref 0.00–0.09)
Troponin i, poc: <0.05 ng/mL (ref 0.00–0.09)

## 2011-02-14 LAB — URINALYSIS, ROUTINE W REFLEX MICROSCOPIC
Bilirubin Urine: NEGATIVE
Glucose, UA: NEGATIVE mg/dL
Hgb urine dipstick: NEGATIVE
Ketones, ur: NEGATIVE mg/dL
Nitrite: POSITIVE — AB
Protein, ur: NEGATIVE mg/dL
Specific Gravity, Urine: 1.014 (ref 1.005–1.030)
Urobilinogen, UA: 1 mg/dL (ref 0.0–1.0)
pH: 7 (ref 5.0–8.0)

## 2011-02-14 LAB — DIFFERENTIAL
Basophils Absolute: 0 K/uL (ref 0.0–0.1)
Basophils Relative: 1 % (ref 0–1)
Eosinophils Absolute: 0.1 K/uL (ref 0.0–0.7)
Eosinophils Relative: 3 % (ref 0–5)
Lymphocytes Relative: 46 % (ref 12–46)
Lymphs Abs: 1.7 10*3/uL (ref 0.7–4.0)
Monocytes Absolute: 0.2 10*3/uL (ref 0.1–1.0)
Monocytes Relative: 6 % (ref 3–12)
Neutro Abs: 1.6 10*3/uL — ABNORMAL LOW (ref 1.7–7.7)
Neutrophils Relative %: 45 % (ref 43–77)

## 2011-02-14 LAB — URINE CULTURE: Colony Count: 50000

## 2011-02-14 LAB — CBC
HCT: 33 % — ABNORMAL LOW (ref 36.0–46.0)
Hemoglobin: 11.2 g/dL — ABNORMAL LOW (ref 12.0–15.0)
MCHC: 34 g/dL (ref 30.0–36.0)
MCV: 85 fL (ref 78.0–100.0)
Platelets: 159 K/uL (ref 150–400)
RBC: 3.89 MIL/uL (ref 3.87–5.11)
RDW: 15.1 % (ref 11.5–15.5)
WBC: 3.7 10*3/uL — ABNORMAL LOW (ref 4.0–10.5)

## 2011-02-14 LAB — POCT I-STAT, CHEM 8
BUN: 16 mg/dL (ref 6–23)
Calcium, Ion: 1.22 mmol/L (ref 1.12–1.32)
Chloride: 106 mEq/L (ref 96–112)
Creatinine, Ser: 0.5 mg/dL (ref 0.4–1.2)
Glucose, Bld: 86 mg/dL (ref 70–99)
HCT: 36 % (ref 36.0–46.0)
Hemoglobin: 12.2 g/dL (ref 12.0–15.0)
Potassium: 3.4 mEq/L — ABNORMAL LOW (ref 3.5–5.1)
Sodium: 144 meq/L (ref 135–145)
TCO2: 31 mmol/L (ref 0–100)

## 2011-02-14 LAB — URINE MICROSCOPIC-ADD ON

## 2011-02-14 MED ORDER — FENTANYL 50 MCG/HR TD PT72: 1.0000 | MEDICATED_PATCH | TRANSDERMAL | Status: DC

## 2011-03-02 LAB — DIFFERENTIAL
Basophils Absolute: 0 10*3/uL (ref 0.0–0.1)
Basophils Relative: 1 % (ref 0–1)
Monocytes Absolute: 0.4 10*3/uL (ref 0.1–1.0)
Neutro Abs: 4.1 10*3/uL (ref 1.7–7.7)

## 2011-03-02 LAB — CBC
Hemoglobin: 12.2 g/dL (ref 12.0–15.0)
MCHC: 33 g/dL (ref 30.0–36.0)
Platelets: 165 10*3/uL (ref 150–400)
RDW: 14.1 % (ref 11.5–15.5)

## 2011-03-02 LAB — URINALYSIS, ROUTINE W REFLEX MICROSCOPIC
Bilirubin Urine: NEGATIVE
Glucose, UA: NEGATIVE mg/dL
Ketones, ur: 40 mg/dL — AB
Nitrite: NEGATIVE
pH: 5.5 (ref 5.0–8.0)

## 2011-03-02 LAB — COMPREHENSIVE METABOLIC PANEL
Albumin: 4 g/dL (ref 3.5–5.2)
Alkaline Phosphatase: 78 U/L (ref 39–117)
BUN: 23 mg/dL (ref 6–23)
CO2: 25 mEq/L (ref 19–32)
Chloride: 103 mEq/L (ref 96–112)
Glucose, Bld: 96 mg/dL (ref 70–99)
Potassium: 3.7 mEq/L (ref 3.5–5.1)
Total Bilirubin: 1 mg/dL (ref 0.3–1.2)

## 2011-03-02 LAB — URINE CULTURE
Colony Count: NO GROWTH
Culture: NO GROWTH

## 2011-03-02 LAB — HEMOCCULT GUIAC POC 1CARD (OFFICE): Fecal Occult Bld: NEGATIVE

## 2011-03-02 LAB — URINE MICROSCOPIC-ADD ON

## 2011-03-05 LAB — CBC
MCHC: 32.9 g/dL (ref 30.0–36.0)
RDW: 14.8 % (ref 11.5–15.5)

## 2011-03-05 LAB — URINALYSIS, ROUTINE W REFLEX MICROSCOPIC
Bilirubin Urine: NEGATIVE
Hgb urine dipstick: NEGATIVE
Ketones, ur: NEGATIVE mg/dL
Protein, ur: NEGATIVE mg/dL
Urobilinogen, UA: 0.2 mg/dL (ref 0.0–1.0)

## 2011-03-05 LAB — POCT CARDIAC MARKERS
CKMB, poc: <1 ng/mL — ABNORMAL LOW (ref 1.0–8.0)
Myoglobin, poc: 56 ng/mL (ref 12–200)

## 2011-03-05 LAB — DIFFERENTIAL
Basophils Absolute: 0 10*3/uL (ref 0.0–0.1)
Basophils Relative: 1 % (ref 0–1)
Eosinophils Absolute: 0.1 10*3/uL (ref 0.0–0.7)
Monocytes Absolute: 0.3 10*3/uL (ref 0.1–1.0)
Neutro Abs: 4 10*3/uL (ref 1.7–7.7)
Neutrophils Relative %: 66 % (ref 43–77)

## 2011-03-05 LAB — BASIC METABOLIC PANEL
CO2: 26 mEq/L (ref 19–32)
Calcium: 8.8 mg/dL (ref 8.4–10.5)
Creatinine, Ser: 0.74 mg/dL (ref 0.4–1.2)
Glucose, Bld: 93 mg/dL (ref 70–99)

## 2011-03-06 LAB — POCT HEMOGLOBIN-HEMACUE: Hemoglobin: 12.9 g/dL (ref 12.0–15.0)

## 2011-03-13 LAB — CBC
HCT: 31.6 % — ABNORMAL LOW (ref 36.0–46.0)
HCT: 34.5 % — ABNORMAL LOW (ref 36.0–46.0)
Hemoglobin: 10.3 g/dL — ABNORMAL LOW (ref 12.0–15.0)
Hemoglobin: 11.3 g/dL — ABNORMAL LOW (ref 12.0–15.0)
MCHC: 32.7 g/dL (ref 30.0–36.0)
MCHC: 32.8 g/dL (ref 30.0–36.0)
MCV: 83.4 fL (ref 78.0–100.0)
Platelets: 183 10*3/uL (ref 150–400)
RDW: 14 % (ref 11.5–15.5)
RDW: 14.3 % (ref 11.5–15.5)

## 2011-03-13 LAB — URINALYSIS, ROUTINE W REFLEX MICROSCOPIC
Bilirubin Urine: NEGATIVE
Hgb urine dipstick: NEGATIVE
Ketones, ur: NEGATIVE mg/dL
Nitrite: NEGATIVE
Protein, ur: NEGATIVE mg/dL
Urobilinogen, UA: 0.2 mg/dL (ref 0.0–1.0)

## 2011-03-13 LAB — CULTURE, BLOOD (ROUTINE X 2): Culture: NO GROWTH

## 2011-03-13 LAB — GLUCOSE, CAPILLARY: Glucose-Capillary: 88 mg/dL (ref 70–99)

## 2011-03-13 LAB — BASIC METABOLIC PANEL
CO2: 26 mEq/L (ref 19–32)
CO2: 27 mEq/L (ref 19–32)
Calcium: 9.1 mg/dL (ref 8.4–10.5)
Calcium: 9.2 mg/dL (ref 8.4–10.5)
Chloride: 105 mEq/L (ref 96–112)
Creatinine, Ser: 0.52 mg/dL (ref 0.4–1.2)
GFR calc Af Amer: >60 mL/min (ref >60)
GFR calc non Af Amer: >60 mL/min (ref >60)
Glucose, Bld: 85 mg/dL (ref 70–99)
Glucose, Bld: 93 mg/dL (ref 70–99)
Sodium: 138 mEq/L (ref 135–145)
Sodium: 143 mEq/L (ref 135–145)

## 2011-03-13 LAB — DIFFERENTIAL
Basophils Absolute: 0 10*3/uL (ref 0.0–0.1)
Basophils Relative: 1 % (ref 0–1)
Eosinophils Absolute: 0.1 10*3/uL (ref 0.0–0.7)
Eosinophils Relative: 2 % (ref 0–5)
Monocytes Absolute: 0.3 10*3/uL (ref 0.1–1.0)
Monocytes Relative: 6 % (ref 3–12)
Neutro Abs: 2.6 10*3/uL (ref 1.7–7.7)

## 2011-03-13 LAB — CARDIAC PANEL(CRET KIN+CKTOT+MB+TROPI): Troponin I: <0.01 ng/mL (ref 0.00–0.06)

## 2011-03-13 LAB — URINE CULTURE

## 2011-03-13 LAB — CK TOTAL AND CKMB (NOT AT ARMC)
CK, MB: 0.9 ng/mL (ref 0.3–4.0)
Total CK: 64 U/L (ref 7–177)

## 2011-03-17 ENCOUNTER — Ambulatory Visit (INDEPENDENT_AMBULATORY_CARE_PROVIDER_SITE_OTHER): Payer: Medicare Other | Admitting: Internal Medicine

## 2011-03-17 ENCOUNTER — Encounter: Payer: Self-pay | Admitting: Internal Medicine

## 2011-03-17 VITALS — BP 116/74 | HR 72 | Temp 98.7°F | Wt 125.0 lb

## 2011-03-17 DIAGNOSIS — G9050 Complex regional pain syndrome I, unspecified: Secondary | ICD-10-CM

## 2011-03-17 DIAGNOSIS — R300 Dysuria: Secondary | ICD-10-CM

## 2011-03-17 DIAGNOSIS — N390 Urinary tract infection, site not specified: Secondary | ICD-10-CM

## 2011-03-17 DIAGNOSIS — E538 Deficiency of other specified B group vitamins: Secondary | ICD-10-CM

## 2011-03-17 DIAGNOSIS — R52 Pain, unspecified: Secondary | ICD-10-CM

## 2011-03-17 LAB — POCT URINALYSIS DIPSTICK
Bilirubin, UA: NEGATIVE
Glucose, UA: NEGATIVE
Spec Grav, UA: 1.02

## 2011-03-17 MED ORDER — FENTANYL 50 MCG/HR TD PT72: 1.0000 | MEDICATED_PATCH | TRANSDERMAL | Status: DC

## 2011-03-17 MED ORDER — CYANOCOBALAMIN 1000 MCG/ML IJ SOLN
1000.0000 ug | Freq: Once | INTRAMUSCULAR | Status: AC
  Administered 2011-03-17: 1000 ug via INTRAMUSCULAR

## 2011-03-17 NOTE — Assessment & Plan Note (Signed)
ua is minimally abnormal Check culture today

## 2011-03-17 NOTE — Assessment & Plan Note (Signed)
Injection today 

## 2011-03-17 NOTE — Progress Notes (Signed)
  Subjective:    Patient ID: [name], female    DOB: 12/12/1945, 65 y.o.   MRN: [id number]  HPI  Rash--no recurrence  Intermittent dysuria--hx of urosepsis   RSD---pain under control with meds-- she may want to discuss less medications  CHF---no recurrence---thought secondary to sepsis    Past Medical History  Diagnosis Date  . B12 DEFICIENCY 05/03/2007  . FIBROMYALGIA 05/03/2007  . COLONIC POLYPS, HX OF 05/03/2007  . ANEMIA-NOS 09/25/2007  . HYPERLIPIDEMIA 12/19/2007  . HYPOTENSION, ORTHOSTATIC 12/06/2008  . OSTEOPOROSIS 11/10/2009  . MYOCARDIAL INFARCTION 01/25/2010  . 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 CARDIOMYOPATHY 02/08/2010  . LUPUS 02/08/2010  . CONGESTIVE HEART FAILURE, HX OF 02/08/2010  . GERD 02/22/2010   Past Surgical History  Procedure Date  . Cholecystectomy   . Abdominal hysterectomy     oophorectomy  . Cystoscopy     reports that she has never smoked. She does not have any smokeless tobacco history on file. She reports that she does not drink alcohol. Her drug history not on file. family history includes Breast cancer in her sister; Cancer in her mother; Colitis in her sister; Diabetes in her father; Heart attack in her father; Lung cancer in her brother; Ovarian cancer in her sister; Prostate cancer in her father; and Uterine cancer in her sister. Allergies  Allergen Reactions  . Imipramine Hcl     REACTION: rash  . Iohexol      Code: HIVES, Desc: PT developed 2 hives, followed by SOB, severe headache post 87cc's Omnipaque 300., Onset Date: [id number]   . Morphine Sulfate     REACTION: rash  . Sulfamethoxazole     REACTION: rash    Review of Systems  patient denies chest pain, shortness of breath, orthopnea. Denies lower extremity edema, abdominal pain, change in appetite, change in bowel movements. Patient denies rashes, musculoskeletal complaints. No other specific complaints in a complete review of systems.         Physical  Exam Well-developed, well-nourished female in no acute distress. HEENT exam atraumatic, normocephalic, neck supple without jugular venous distention or thyromegaly. Chest clear to auscultation without increased work of breathing. Cardiac exam S1-S2 are regular. Abdominal exam thin, bowel sounds, soft. Extremities there is no edema. Neurologic exam she is alert with a normal gait. Dermatological exam no significant rashes.       Assessment & Plan:

## 2011-03-17 NOTE — Assessment & Plan Note (Signed)
Improved pain control Will continue meds one more month and then consider decreasing meds.

## 2011-03-20 LAB — URINE CULTURE: Colony Count: >=100000

## 2011-04-07 ENCOUNTER — Other Ambulatory Visit: Payer: Self-pay | Admitting: Internal Medicine

## 2011-04-11 NOTE — Op Note (Signed)
[name], [name]                 ACCOUNT NO.:  [id number]   MEDICAL RECORD NO.:  [id number]          PATIENT TYPE:  AMB   LOCATION:  [facility]                         FACILITY:  [facility]   PHYSICIAN:  Mark C. Vernie Ammons, M.D.  DATE OF BIRTH:  02-08-1946   DATE OF PROCEDURE:  05/07/2009  DATE OF DISCHARGE:                               OPERATIVE REPORT   PREOPERATIVE DIAGNOSIS:  Interstitial cystitis.   POSTOPERATIVE DIAGNOSIS:  Interstitial cystitis.   PROCEDURES:  1. Cystoscopy with urethral dilatation.  2. Hydrodistention.  3. Injection of steroid   SURGEON:  Mark C. Vernie Ammons, M.D.   ANESTHESIA:  General.   SPECIMENS:  None.   BLOOD LOSS:  None.   DRAINS:  None.   COMPLICATIONS:  None.   INDICATIONS:  The patient is a 65 year old female with a history of  interstitial cystitis who has benefitted from hydrodistention in the  past.  She returned to see me symptomatic and was tried on conservative  medical management, without improvement.  She therefore has elected to  proceed with hydrodistention and understands the risks, complications,  alternatives and limitations.   DESCRIPTION OF OPERATION:  After informed consent, the patient was  brought to the major OR and placed on the table, administered general  anesthesia, then moved to the dorsal lithotomy position.  Her genitalia  was sterilely prepped and draped and an official time-out was then  performed.   A 22-French cystoscope was then passed under direct vision with a 12-  degrees lens through the urethra, which was normal.  The bladder was  then entered.  The ureteral orifices were noted to normal configuration  and position.  A full systematic survey of the interior of the bladder  was then performed and no tumor, stones or inflammatory lesions were  identified.  She was then distended to 100 cm of water pressure and then  the bladder was drained.  The terminal effluent was slightly bloody.  Her bladder capacity under  anesthesia was found to be 650 mL.   Reinspection of the bladder revealed multiple glomerulations and  petechial hemorrhages.  These were photographed.   I then hydrodistended her a second time, drained the bladder, and  performed urethral dilatation.   The female sounds were then used to dilate the urethra to 30-French.  I  then instilled a solution of 400 mg of crushed Pyridium in 10 mL of  0.25% Marcaine solution.   A therapeutic injection of a combination of Kenalog and Marcaine was  then performed through the vaginal mucosa in the suburethral region  laterally to the urethra and at the bladder neck extending back to the  area of the trigone with several evenly spaced injection sites.  A  vaginal packing was then inserted and the patient was awakened and taken  to the recovery room in stable satisfactory condition.  She tolerated  procedure well with no intraoperative complications.  Her vaginal  packing will be removed in the recovery room and she will be discharged  after she fully recovers.      Mark C. Vernie Ammons, M.D.  Electronically Signed     MCO/MEDQ  D:  05/07/2009  T:  05/07/2009  Job:  [id number]

## 2011-04-11 NOTE — Assessment & Plan Note (Signed)
[facility]                            CARDIOLOGY OFFICE NOTE   NAILYN, [name]                        MRN:          [id number]  DATE:09/02/2007                            DOB:          [birth year]    [name] returns today for followup. 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 She had a remarkable recovery  in her LV function by MRI in April into the normal range.   PAST MEDICAL HISTORY:  Is somewhat complicated. She has broken her right  leg in the past and subsequently had reflex sympathetic dystrophy. He is  on the fentanyl patch for chronic pain syndrome. There is a question of  mixed connective tissue disease as well. Her review of systems, in  regards to her cardiomyopathy, she has not had any exertional dyspnea.  There has been no PND or orthopnea. No chest pain. We have not done a  Myoview on her, but she did not have evidence of coronary disease when  her ejection fraction was decreased. She had a Myoview which was normal  in 2004. I do not see the need to rule out coronary disease given her  atypical pain and marked improvement in LV function after her sepsis was  resolved. In regards to her review of systems, she has had recent ulcers  on the right lower extremity. 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 She is on CellCept for this I believe. She also  recounts episodes of syncope. She has had 2-3 episodes that tend to  recur under stressful situations. The last episode was when her brother  died. They do not sound cardiac in etiology. They sound like simple  faints in relationship to stressful situations.   When she was in the hospital, she had no significant heart block. In  fact, she was somewhat tachycardic.   CURRENT MEDICATIONS:  1. Klonopin 0.5 b.i.d.  2. Fentanyl patch 15 mcg every three days.  3. Multivitamins.  4. Omeprazole 20 a day.  5. Cellcept 500 mg a  day.   PHYSICAL EXAMINATION:  Is remarkable for a thin, white female in no  distress. Affect is appropriate. She is fairly frail weighing 134  pounds. Her respiratory rate is 14. Blood pressure 111/70, pulse 65 and  regular. Afebrile.  HEENT: Is normal. Carotids are normal without bruits. There is no  thyromegaly. No lymphadenopathy. No JVP elevation.  LUNGS:  Are clear with good diaphragmatic motion.  No wheezing.  There is an S1, S2 with normal heart sounds. PMI is normal.  ABDOMEN: Is benign. Bowel sounds positive. No AAA. No tenderness. No  hepatosplenomegaly or hepatojugular reflux.  Distal pulses are intact. There is trace lower extremity edema. She has  some chronic discoid changes in the right lower extremity greater than  left. She has some indurated ulcers in the right lower extremity and  also in the right arm. 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  NEURO: Is nonfocal.  SKIN: Is otherwise warm and dry except  for the lesions as noted. There  is no muscular weakness.   IMPRESSION:  1. 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 Normal      ejection fraction by MRI on February 28, 2007 with no hyper-      enhancement. Digoxin stopped, currently asymptomatic. Followup echo      in December.  2. Mixed connective tissue disease. Followup with Dr.  Cato Mulligan.      Continue Cellcept. I am not sure she has had trials of Plaquenil or      prednisone. I will leave this up to Dr.  Cato Mulligan to follow and      possibly refer to Dermatology and Rheumatology.  3. 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 Continue      fentanyl patch. Followup pain clinic and Dr.  Cato Mulligan.  4. History of reflux and gastroesophageal reflux disease. Continue      omeprazole 20 mg a day.   Overall, I think the patient's cardiac status is stable. She does not  need the stress testing and we will followup her LV function in December  by  echo.     [name]. Eden Emms, MD, [facility]  Electronically Signed    PCN/MedQ  DD: 09/02/2007  DT: 09/02/2007  Job #: [id number]

## 2011-04-11 NOTE — Discharge Summary (Signed)
NAMEROBERTA, [name]                 ACCOUNT NO.:  [id number]   MEDICAL RECORD NO.:  [id number]          PATIENT TYPE:  INP   LOCATION:  1523                         FACILITY:  [facility]   PHYSICIAN:  Valerie A. Felicity Coyer, MDDATE OF BIRTH:  December 02, 1945   DATE OF ADMISSION:  02/05/2008  DATE OF DISCHARGE:  02/12/2008                               DISCHARGE SUMMARY   DISCHARGE DIAGNOSES:  1. Septic shock secondary to Escherichia coli pyelonephritis resolved      and improved.  Continue seven days further Cipro therapy for total      of two weeks adequate antibiotics.  2. 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  Continue CellCept with      consideration of another referral to tertiary center for      reevaluation of potential diagnosis of potential connective tissue      type disease and/or discontinuation of CellCept therapy.  3. Reflex sympathetic dystrophy of right upper extremity with chronic      pain.  4. Fibromyalgia.  5. Iron deficiency with chronic disease.  6. Gastroesophageal reflux disease with hiatal hernia.  Continue      proton pump inhibitor and H2 blocker.  7. 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  No evidence of      recurrence during this hospitalization.  Left ventricular ejection      fraction low normal 50% on echocardiogram this hospitalization.  8. Orthostatic hypotension due to presumed autonomic dysfunction.   DISCHARGE MEDICATIONS:  1. Cipro 500 mg b.i.d. x7 additional days to complete two week      antibiotic course.  2. Other medications are prior to admission without change and include      Duragesic patch 100 mcg change every third day.  3. CellCept 500 mg tablets one tablet every other day alternating with      1/2 tablet every other day.  4. Vitamin B12 100 mg shots once monthly.  5. Triamcinolone 0.025% cream as needed to rash.  6. Klonopin 0.5 mg b.i.d. p.r.n.  7. Omeprazole  20 mg once daily.  8. Pepcid 20 mg once b.i.d.  9. Tramadol 50 mg t.i.d. p.r.n. pain.   DISPOSITION:  The patient is discharged home with [facility] home [facility]  home health PT and OT.   CONDITION ON DISCHARGE:  Medically stable though still with some  autonomic dysfunction manifested by orthostatic hypotension.  Will place  TED hose on patient at time of discharge with close outpatient follow-up  as above.   HOSPITAL COURSE:  1. Septic shock with pyelonephritis.  The patient is a 66 year old      woman on chronic CellCept for a clearly defined autoimmune disease      that manifested itself as rash flares.  Maintained on chronic      CellCept therapy who presents to her primary care physician day of      admission due to less than one day of nausea, vomiting with flank      pain.  Urinalysis in the office was consistent  with UTI and due to      patient's own personal history of severe septic shock associated      with any type of infection or postop state blood cultures and urine      cultures were sent to the office, given a dose of IM Rocephin, and      sent to the ICU at [facility] for further observation.  She was      markedly hypotensive and febrile for the first 24 hours, and as      such Zosyn and vancomycin were added to her regimen pending      cultures.  She was aggressively resuscitated with IV fluid, and      critical care also saw the patient in consultation.  Due to her      history of cardiomyopathy in the setting of septic shock during      February 2008 admission 2-D echo was monitored as were closely her      I's and O's.  Respiratory status and heart function remained stable      this hospitalization.  Echocardiogram was without abnormality, and      there was no evidence of pulmonary or peripheral edema during this      hospitalization as stated above.  Repeat 2-D echo done at the      height of her septic shock showed a low normal ejection fraction at      50%.   As the patient stabilized in the first 48 hours and culture      results returned with an E. coli in the urine which was sensitive      to Cipro the patient's antibiotic regimen was narrowed.  There was      one of two blood cultures that returned with a staph species likely      contaminant, and vancomycin was discontinued after five days.  It      should be noted that on day #2 when patient was brought into Zosyn      therapy for her septic shock she did develop a recurrence of rash      in the left upper extremity distal from where the Zosyn was being      infused into her peripheral vein.  At first there was concern for      this to being drug reaction, but there was no generalization of her      rash and no response to Benadryl.  The patient states MI concurrent      with this is similar to that of her previous autoimmune rashes      which occurred at this time in the setting of discontinuing      CellCept due to her septic shock.  Her CellCept was resumed on day      #4 of hospitalization, and her rash slowly resolved.  Therefore I      do not believe the patient has a true Zosyn allergy, and will ask      pharmacy and hospital records to reflect such.  At this time she      has remained hemodynamically stable, and has been transferred to      telemetry floor.  PT and OT have seen patient, and she is doing      well with a notable exception of autonomic dysfunction manifested      in the form of orthostatic hypotension when standing. Clearly her      volume status has  normalized and at rest.  She is no longer      symptomatic or shocking.  Therefore TED hose will be placed as      patient says she has had this experience before and postop other      critical illnesses.  Further evaluation of this as well as her      presumed autoimmune disease will be further evaluated done on      ongoing outpatient basis after recovery of this.  Patient will be      placed with bilateral TED hose  and arrange for home health therapy,      and at this time is felt medically stable for discharge home having      reached maximum benefit of hospitalization and care.  2. Other medical issues.  The patient's other medical issues are as      listed above, and no other changes have been made in her regimen      during this time.   Greater than 34 minutes on discharge planning and preparation.      Valerie A. Felicity Coyer, MD  Electronically Signed     VAL/MEDQ  D:  02/12/2008  T:  02/12/2008  Job:  [id number]

## 2011-04-11 NOTE — Procedures (Signed)
EEG NUMBER:  [phone]   CLINICAL HISTORY:  The patient is a nearly 65 year old with syncope and  orthostasis with recurrent syncope, nausea and vomiting.  The study is  being done to look for the presence of seizures. (780.2)   PROCEDURE:  The tracing is carried out of 32-channel digital Cadwell  recorder re-formatted into 16-channel montages, with one devoted to EKG.  The patient was awake during the recording.  The [employer] 10/20  system lead placement was used.   MEDICATIONS:  Include vitamin B12, Protonix, Lovenox, Florinef, vitamin  D, guaifenesin, Duragesic, Klonopin, Senokot, Tylenol and Zofran.   DESCRIPTION OF FINDINGS:  Dominant frequency is a 9-10 Hz alpha range  activity.  Mixed frequency, diffuse 20-microvolt beta range activity was  seen.  There is no focal slowing.  There is no interictal epileptiform  activity in the form of spikes or sharp waves.   Activating procedures with hyperventilation were not carried out.  Photic stimulation induced a driving response from 7-82 Hz.   EKG showed a regular sinus rhythm with ventricular response of 78 beats  per minute.   IMPRESSION:  Normal waking record.      Deanna Artis. Sharene Skeans, M.D.  Electronically Signed     NFA:OZHY  D:  02/28/2008 16:54:05  T:  02/28/2008 21:14:42  Job #:  [id number]   cc:   Willow Ora, MD  [phone] [address] [place], [place] [address]

## 2011-04-11 NOTE — Discharge Summary (Signed)
[name], BREACH                 ACCOUNT NO.:  [id number]   MEDICAL RECORD NO.:  [id number]          PATIENT TYPE:  INP   LOCATION:  6710                         FACILITY:  [facility]   PHYSICIAN:  Bruce H. Swords, MD    DATE OF BIRTH:  July 26, 1946   DATE OF ADMISSION:  02/17/2008  DATE OF DISCHARGE:  02/28/2008                               DISCHARGE SUMMARY   DISCHARGE DIAGNOSES:  1. Orthostasis with recurrent syncope likely secondary to autonomic      dysfunction.  2. Nausea, vomiting, and diarrhea, resolved, likely secondary to viral      gastroenteritis.  3. RC.  4. Fibromyalgia syndrome.  5. Depression.   HISTORY OF PRESENT ILLNESS:  Ms. Vrba is a 65 year old white female  admitted with chief complaint of syncope.  She was recently discharged  from the hospital. Reported that she was not making any progress at home  and had 2 episodes of syncope on the day of admission upon standing and  one more in the emergency department.  She was admitted for further  evaluation and treatment.   HOSPITAL COURSE:  1. Orthostasis with recurrent syncope.  The patient was admitted.  She      was started on Florinef and was continued with TED hose.  While her      blood pressure remained fairly stable from lying to standing, her      heart rate went up significantly with standing.  This did not      improve significantly with the addition of Florinef.  She was seen      by physical therapy and occupational therapy and it was felt that      she was stable to be discharged home if she has 24-hour supervision      and do not stand up alone.  At this time, the plan is for the      patient to be discharged to home with close outpatient followup      with her primary care Daril Warga and that she will likely be referred      to [facility] as an outpatient for additional workup.   The patient did develop some nausea, vomiting, and diarrhea briefly  during this admission.  She also had some mild  hypokalemia, which was  treated with oral potassium supplementation.   DISCHARGE MEDICATIONS:  1. Monthly B12 injections as before.  2. Prilosec 20 mg p.o. daily.  3. Florinef 0.2 mg p.o. daily.  4. Mucinex 600 mg p.o. b.i.d.  5. Duragesic patch 25 mcg to be changed every 72 hours.  6. Pepcid 20 mg p.o. nightly.  7. Triamcinolone cream as needed for rash.  8. Klonopin 0.5 mg p.o. b.i.d. as needed.   DISPOSITION:  The patient is discharged to home.   FOLLOWUP:  The patient is to follow up with Dr. [name] on April 8  at 11:30 a.m.   PERTINENT LABORATORY DATA AT TIME OF DISCHARGE:  Hemoglobin 10.6, BUN  13, creatinine 0.71, potassium 3.2 prior to repletion, and sodium 139.      Sandford Craze, NP  Bruce Rexene Edison Swords, MD  Electronically Signed    MO/MEDQ  D:  02/28/2008  T:  02/29/2008  Job:  [id number]

## 2011-04-11 NOTE — Assessment & Plan Note (Signed)
[facility]                            CARDIOLOGY OFFICE NOTE   Tara, [name]                        MRN:          [id number]  DATE:10/29/2007                            DOB:          10/08/1946    [name] returns today in followup.  She has had previous congestive  heart failure with either septic cardiomyopathy or Takotsubo disease.  She has done extremely well over the last 6 months.  Her cardiac  function has returned to normal.   She has not had any significant dyspnea, PND, or orthopnea.  There has  been no lower extremity edema.   She continues to have chronic problems in regards to connective tissue  disease and appears to be somewhat immunosuppressed.   She uses Duragesic patch and fentanyl patch for chronic pain.   REVIEW OF SYSTEMS:  Her current Review of Systems is remarkable for  significant back pain.  This is to be further worked up by Dr. Cato Mulligan.  I believe she may need an MRI.  She also is having frequent urinary  tract infections and may need to see a urologist.  Review of Systems is  otherwise negative.  In particular, she has not had any significant  chest pain as indicated and no heart failure symptoms.  She had a 2-D  echocardiogram today which showed normal LV function with an EF of 55-  65%.  There was no evidence of SBE.  There was trivial mitral  insufficiency.   There was mild aortic insufficiency.   MEDICATIONS:  1. Duragesic patch.  2. Fentanyl patch.  3. CellCept 500 a day.  4. Klonopin 0.5 b.i.d.  5. Omeprazole 20 a day.   She is off all her cardiac medications.   PHYSICAL EXAMINATION:  GENERAL:  Remarkable for a healthy-appearing,  middle-age white female in no distress.  VITAL SIGNS:  Weight is 135.  Blood pressure 117/76, pulse 76 and  regular, afebrile, respiratory rate 14.  HEENT:  Unremarkable.  NECK:  Carotids without bruit.  No lymphadenopathy, thyromegaly, JVP  elevation.  LUNGS:  Clear  with diaphragmatic motion, no wheezing.  MUSCULOSKELETAL:  She is not tender to palpation of the back of her  spine.  CARDIAC:  There is an S1, S2.  I cannot hear any rubs or murmurs.  PMI  is normal.  ABDOMEN:  Benign.  Bowel sounds positive.  No hepatosplenomegaly or  hepatojugular reflux.  No tenderness.  No bladder distention.  EXTREMITIES:  Distal pulses are intact without edema.  NEUROLOGIC:  Nonfocal.  No muscular weakness.  SKIN:  Warm and dry.   IMPRESSION:  1. History of septic cardiomyopathy versus Takotsubo.  Left      ventricular function returned to normal off cardiac medications.      Continue low-salt diet.  2. Chronic pain syndrome.  Continue Duragesic patch and fentanyl.      Follow up with Dr. Cato Mulligan.  3. Questionable connective tissue disease.  Continue CellCept 500 a      day.  Needs a uniform diagnosis.  Check sed rate and see Dr.  Swords.  May need referral to a rheumatologist.  4. History of reflux.  Continue omeprazole 20 mg a day.  5. Question frequent urinary tract infections on immunosuppression.      UA and C&S done by Dr. Cato Mulligan to be followed on Friday.   Overall, the patient is doing well.  She will be seen on a p.r.n. basis  here in the cardiology clinic since she has sustained good LV function  since hospital discharge.     [name]. Eden Emms, MD, [facility]  Electronically Signed    PCN/MedQ  DD: 10/29/2007  DT: 10/29/2007  Job #: [id number]

## 2011-04-11 NOTE — Discharge Summary (Signed)
[name], [name]                 ACCOUNT NO.:  [id number]   MEDICAL RECORD NO.:  [id number]          PATIENT TYPE:  INP   LOCATION:  4740                         FACILITY:  [facility]   PHYSICIAN:  Valerie A. Felicity Coyer, MDDATE OF BIRTH:  1945/12/13   DATE OF ADMISSION:  12/06/2008  DATE OF DISCHARGE:  12/08/2008                               DISCHARGE SUMMARY   PRIMARY CARE PHYSICIAN:  Valetta Mole. Swords, MD   DISCHARGE DIAGNOSES:  Recurrent syncope in setting of orthostatic  hypotension of unclear etiology.   HISTORY OF PRESENT ILLNESS:  [name] is a 65 year old white female with  past medical history of orthostatic hypertension with recurrent syncope,  who presented to [facility] Emergency Room on day of admission with  status post syncopal event.  The patient and family report loss of  consciousness.  While sitting in pew at church on Sunday, the patient  reported associated nausea, warm feeling, and choking sensation.  The  patient denied any chest pain, shortness of breath, or palpitations  preceding episode.  No change in position leading up to episode.  The  patient has been worked up in the past for syncope including EKG, 2-D  echocardiogram, Holter monitoring, and tilt table testing all within  normal limits.  The patient also seen at [facility] for same symptoms with no etiology identified.  The patient was  admitted on December 06, 2008, to rule out acute cardiac etiology.   PAST MEDICAL HISTORY:  1. RSD, right upper extremity.  2. Fibromyalgia.  3. Unspecified recurrent rash responsive to CellCept.  4. Vitamin B12 deficiency.  5. E-coli sepsis with vent-dependent respiratory failure.  6. GERD.  7. Status post cholecystectomy.  8. Status post hysterectomy.  9. Recurrent syncope in setting orthostatic hypotension.   COURSE OF HOSPITALIZATION:  1. Syncopal event.  Again, the patient admitted from ER to rule out      infectious or cardiac  etiology.  Chest x-ray with no acute      findings.  Urinalysis negative with urine culture no growth to      date.  Blood cultures x2 obtained at time of admission are negative      for any growth to date.  The patient remains mildly orthostatic at      time of dictation.  The patient is instructed to continue Florinef      and wear TED hose until further instruction by primary care      physician.  Of note, the patient with cardiac enzymes negative x3      obtained during this admission.  No further indication for      inpatient workup.  We will defer further workup to the patient's      primary care physician.   MEDICATIONS AT TIME OF DISCHARGE:  1. Duragesic patch 50 mcg change q.72 h.  2. Clonazepam 0.5 mg p.o. b.i.d. p.r.n.  3. Vitamin D 1000 units p.o. daily.  4..  Omeprazole 20 mg p.o. daily.  1. Vitamin B12 injections intramuscularly every month.  2. Florinef 0.1 mg p.o.  daily.   DISPOSITION:  The patient felt medically stable for discharge home at  this time.  The patient instructed to continue wearing TED hose until  further instruction by primary care physician.  The patient is  instructed to follow up with her primary care physician Dr. [name]  on Wednesday December 16, 2008, at 11 a.m. to determine further workup  for orthostatic hypotension.      Tara Pen, NP      Raenette Rover. Felicity Coyer, MD  Electronically Signed    LE/MEDQ  D:  12/08/2008  T:  12/08/2008  Job:  [id number]

## 2011-04-14 NOTE — Assessment & Plan Note (Signed)
[facility]                            CARDIOLOGY OFFICE NOTE   [name], Tara                        MRN:          [id number]  DATE:02/13/2007                            DOB:          1945-12-20    OBJECTIVE:  [name] returns today in followup.  I remember seeing her in  the hospital as a consult.  She had significant sepsis and was  intubated.  She was very sick.  At the time she had positive enzymes and  EKG changes.  I did a transesophageal echo on her and she had global  hypokinesis with an EF of 20-25%.  We thought that she may have  cardiomyopathy of sepsis or Takasubo disease.  She did not receive a  heart cath in her hospitalization.  She has been in rehab.  She has  reflex sympathetic dystrophy and is still requiring a walker.  Her heart  function had improved hospital discharge to the 40% range.  I had a  lengthy discussion with her and her husband.  I do not think she needs a  heart cath at this time.  I think she needs to continue her nutritional  and physical therapy recovery.   However, she does need further cardiac workup.  I think the best test  for her in regards to her clinical syndrome of congestive heart failure,  positive enzymes and possible Takasubo disease would be an MRI.  We will  quantitate her LV function and see if there is any hyper-enhancement.   I explained to her that if her heart function continues to improve and  there is no scar tissue that medical therapy would be warranted.  She is  currently euvolemic.  She has had some low blood pressures.  Dr.  __________ recently cut back her Lasix to every other day.  Her BNP was  under 80 and I told her that she could stop her Lasix next week.  She  will continue her Digoxin.   Her blood pressure is otherwise stable.   REVIEW OF SYSTEMS:  She has primarily weakness in her legs.  She is not  having any significant PND or orthopnea.  There has been no palpitations  or history of arrhythmias.   MEDICATIONS:  1. Klonopin 0.5 twice daily.  2. Fentanyl patch for chronic pain.  3. Lasix to be stopped.  4. Digoxin 0.25 mg a day.  5. Omeprazole 20 a day.  6. Cipro 250 twice daily.   PHYSICAL EXAMINATION:  GENERAL:  On exam, she continues to be thin and  frail.  She is not postural.  VITAL SIGNS:  Blood pressure is 116/75, pulse 78 and regular.  HEENT:  Normal.  NECK:  Carotids normal without bruit.  LUNGS:  Clear.  CARDIAC:  S1 and S2.  Normal heart sounds.  ABDOMEN:  Benign.  EXTREMITIES:  Intact pulses.  No edema.  NEURO:  Nonfocal.   LABORATORY DATA:  Lab work done on March 17 showed normal liver tests.  Hematocrit was 38, white count was 5.4.  BNP was 13.   Her EKG  shows sinus rhythm with nonspecific ST&T wave changes.  There is  preserved R waves across the precordium.   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  Follow up cardiac MRI to assess her infarction and quantitate  ejection fraction.  Continue Digoxin only.  Patient to stop her Lasix  due to resolution of her hypervolemia, low BNP and signs of low blood  pressure.  I will see her after her cardiac MRI.  So long as her LV  function continues to improve and there is no scar tissue, we will treat  her medically and not refer her for cath.     [name]. Eden Emms, MD, [facility]  Electronically Signed    PCN/MedQ  DD: 02/13/2007  DT: 02/13/2007  Job #: [id number]

## 2011-04-14 NOTE — Consult Note (Signed)
[name], [name]                 ACCOUNT NO.:  [id number]   MEDICAL RECORD NO.:  [id number]          PATIENT TYPE:  INP   LOCATION:  2112                         FACILITY:  [facility]   PHYSICIAN:  Tara Pick. Eden Emms, MD, FACCDATE OF BIRTH:  February 03, 1946   DATE OF CONSULTATION:  DATE OF DISCHARGE:                                 CONSULTATION   CARDIOLOGY CONSULTATION   HISTORY OF PRESENT ILLNESS:  [name] is an unfortunate 65 year old  patient admitted by the primary care and critical care services on  December 27, 2006.  She has significant sepsis.  Her initial complaint  was weakness for about three days.   She has had fever and chills with some back pain.  Her history is  somewhat complicated in that she does had reflex sympathetic dystrophy  in the right upper extremity and history of fibromyalgia.   She subsequently decompensated in the hospital on December 28, 2006 and  needed to be intubated.  At the time she had frothy sputum and her chest  x-ray showed changes more consistent with pulmonary edema.  Since that  time she has lost R waves in her EKG and clearly has sustained an  anterior wall myocardial infarction.  Her CPK's have increased from 148  to 243 and continue to be positive with MB's as high as 32.3 and  relative index greater than 2.  Her troponin's have peaked at 5.5.   Currently, I cannot get a history from the patient because she is  intubated and paralyzed.   PAST MEDICAL HISTORY:  Her past medical history is remarkable for reflex  sympathetic dystrophy of the right upper extremity, fibromyalgia,  history of B12 deficiency, migraines.  She has been seen by Dr. Dietrich Pates  on September 22, 2005 for postoperative hypotension, chest pain and fluid  overload.  He did not think she had an active cardiac problem.   She had an echocardiogram at that time in October, 2006, which showed  normal left ventricular function with no significant valvular heart  disease.   In  talking to Dr. Jayme Cloud, the patient probably had pyelonephritis with  severe sepsis syndrome.   Currently, she has been on Xigris for 48 hours and her blood cultures  appear to show gram-negative bacteremia.   CURRENT MEDICATIONS:  Include:  1. Xigris.  2. Please see the nurses flow sheet for her other medications.   She was on Duragesic, Enablex, Cell-Cept, Xanax, Zelnorm and omeprazole  prior to admission.   SOCIAL HISTORY:  She is married, lives in [place]. She is disabled  due to her reflex sympathetic dystrophy and fibromyalgia.  She does not  smoke or drink.   FAMILY HISTORY:  Remarkable for esophageal cancer in her mother.  Father  had an MI but he was an octogenarian at the time.  Brother had an MI at  age 25.   PHYSICAL EXAMINATION:  VITAL SIGNS:  On exam currently the blood  pressure is 110/70.  She is in sinus rhythm at a rate of 95.  She is  intubated.  She has central lines in  place.  She is paralyzed and  sedate.  LUNGS:  Have inspiratory crackles throughout.  HEART:  There is an S1, S2 without murmur.  ABDOMEN:  Benign  EXTREMITIES:  Pulses are intact with no edema.   CLINICAL DATA:  Her EKG shows loss of her R waves across the precordium  and nonspecific ST-T wave changes.   IMPRESSION:  Unfortunately, [name] has suffered a subendocardial  myocardial infarction in the setting of severe sepsis.   On occasion we will see Takasubo's syndrome in this setting, however,  she very well could also have had an obstructive thrombotic coronary  artery occlusion.   We will try to do a TEE while she is intubated and sedated to assess her  left ventricular function.  I suspect she will have significant anterior  apical wall motion abnormalities and her ejection fraction will be in  the 30 to 40% range, based on her enzymes and ECG.  If this is the case,  she would probably benefit more from systemic heparin than Xigris.  She  has had 48 hours of Xigris and has  significant anti-thrombotic  properties and cannot be given with systemic heparin.  If her left  ventricular function is not that bad, then I would continue deep venous  thrombosis prophylaxis, Lovenox, and her Xigris.   Her overall prognosis is poor given her severe sepsis and an acute  myocardial infarction.   Critical Care will continue to support her with drugs and intubation.  I  did speak with the family today.  The husband understands her poor  prognosis.   She is clearly not a candidate for acute intervention in the lab and  there is no acute ST elevation on her ECG's.      Tara Pick. Eden Emms, MD, [facility]  Electronically Signed     PCN/MEDQ  D:  12/30/2006  T:  12/30/2006  Job:  [id number]

## 2011-04-14 NOTE — Op Note (Signed)
[name], [name]                 ACCOUNT NO.:  [id number]   MEDICAL RECORD NO.:  [id number]          PATIENT TYPE:  INP   LOCATION:  2112                         FACILITY:  [facility]   PHYSICIAN:  [name]. Eden Emms, MD, FACCDATE OF BIRTH:  August 14, 1946   DATE OF PROCEDURE:  DATE OF DISCHARGE:                               OPERATIVE REPORT   PROCEDURE:  Transesophageal echocardiogram.   INDICATION:  A 65 year old patient extremely ill with sepsis, intubated  and paralyzed.  Patient developed pulmonary edema with positive enzymes.  TE was indicated to assess RV and LV function and to rule out  vegetations.   The patient was already paralyzed and sedated.   Using digital technique and OmniPlane, the probe was advanced into the  distal esophagus without incident.  The endotracheal tube was in good  position and was not disturbed by our probe.   Transgastric imaging revealed severe global hypokinesis.  Interestingly,  the anterior wall and apex were severely hypokinetic, but the inferior  wall also was.  The global ejection fraction was only 20%.   There was no mural apical thrombus.  The areas of hypokinesis span more  than 1 coronary distribution.  The mitral valve was mildly thickened  with trace MR.  Aortic valve was trileaflet with trace AI.  Tricuspid  valve showed mild insufficiency.  Right sided cardiac chambers were  normal.  There was no ASD or VSD.  There was some mild spontaneous  contrast in the left atrium but no left atrial appendage thrombus.  Imaging of the aorta showed no significant debris.   FINAL IMPRESSION:  1. More global hypokinesis with an EF of 20%, no apical thrombus.  2. Trace mitral regurgitation, arterial insufficiency and tricuspid      regurgitation with no evidence of vegetations.  3. Normal right sided cardiac chambers.  4. No left atrial appendage thrombus.  5. No aortic debris.   The degree of LV dysfunction is disproportionate to the patient's  CPK  bump and it is also spans more than one coronary distribution.  Hopefully the patient has either Takotsubo disease or a form of septic  cardiomyopathy.   Again, she would not be considered a candidate for the cath lab since  she does not have an acute ST elevation MI.  We will, however, switch  her Xigris over to systemic heparin given the recent decrease in LV  function, antithrombotic effects to prevent mural apical thrombus would  be more important than the continued antiseptic effects from the Xigris.      [name]. Eden Emms, MD, [facility]  Electronically Signed     PCN/MEDQ  D:  12/30/2006  T:  12/30/2006  Job:  [id number]

## 2011-04-14 NOTE — Op Note (Signed)
NAMEBRAZIL, VOYTKO                 ACCOUNT NO.:  [id number]   MEDICAL RECORD NO.:  [id number]          PATIENT TYPE:  AMB   LOCATION:  NESC                         FACILITY:  [facility]   PHYSICIAN:  Mark C. Vernie Ammons, M.D.  DATE OF BIRTH:  10/11/1946   DATE OF PROCEDURE:  01/19/2006  DATE OF DISCHARGE:                                 OPERATIVE REPORT   PREOPERATIVE DIAGNOSIS:  Interstitial cystitis.   POSTOPERATIVE DIAGNOSIS:  Interstitial cystitis.   PROCEDURE:  Cystoscopy with urethral dilatation and hydrodistention.   SURGEON:  Dr. Vernie Ammons   ANESTHESIA:  General.   DRAINS:  None.   BLOOD LOSS:  Minimal.   SPECIMENS:  None.   COMPLICATIONS:  None.   INDICATIONS:  The patient is a 65 year old white female, who was initially  diagnosed with interstitial cystitis approximately 1 year ago.  At that  time, her PUFF score was 23.  Her workup for other abnormalities was  negative, and she was noted also to have fibromyalgia and irritable bowel  syndrome.  She underwent hydrodistention with excellent result.  She  returned to see me with symptoms that have been present for about 6 months  and have worsened.  She has been on Enablex and Pyridium with some relief,  but we discussed repeat hydrodistention with the hopes of achieving longer  lasting control.   DESCRIPTION OF OPERATION:  After informed consent, the patient brought to  the major OR, placed on the table, administered general anesthesia, then  moved to the dorsal lithotomy position.  Her genitalia was sterilely prepped  and draped.  A 22-French cystoscope was then introduced in the bladder, and  the bladder was fully inspected with the 12-degree lens.  No tumor, stones,  or inflammatory lesions were seen.  Old biopsy sites were identified as  scars on the bladder wall.   Her bladder was distended with hydrodistention to 80 cm water pressure, and  photographs were taken of the normal-appearing mucosa.  I then drained the  bladder, found it held approximately 850 mL; however, the terminal effluent  was bloody.  I then recystoscoped and found multiple petechial hemorrhages  and glomerulations.  I therefore removed the cystoscope and inserted a 24-  French Foley catheter.  She was hydrodistended for approximately 5 minutes  to 80 cm water pressure.  I then redrained the bladder, noting approximately  875 mL bladder capacity.  Again, bloody terminal effluent.  I then performed  urethral dilatation with female sounds to 30-French.  I then instilled 400  mg of crushed Pyridium in 15 mL of 0.5% plain Marcaine as well as Kenalog  into the bladder.  The patient was awakened and taken to recovery room in  stable and satisfactory condition.  She tolerated the procedure well with no  intraoperative complications.   I think she needs to be started on some Elmiron, and I have written her a  prescription for that today.  I have also given her a prescription for  Vicodin HP for pain and Pyridium Plus to be taken as needed.      Loraine Leriche  Collier Salina, M.D.  Electronically Signed     MCO/MEDQ  D:  01/19/2006  T:  01/20/2006  Job:  [id number]

## 2011-04-14 NOTE — Op Note (Signed)
[name], [name]                 ACCOUNT NO.:  [id number]   MEDICAL RECORD NO.:  [id number]          PATIENT TYPE:  AMB   LOCATION:  NESC                         FACILITY:  [facility]   PHYSICIAN:  Mark C. Vernie Ammons, M.D.  DATE OF BIRTH:  Mar 29, 1946   DATE OF PROCEDURE:  12/30/2004  DATE OF DISCHARGE:                                 OPERATIVE REPORT   PREOPERATIVE DIAGNOSIS:  Rule out interstitial cystitis.   POSTOPERATIVE DIAGNOSIS:  Rule out interstitial cystitis.   PROCEDURE:  Cystoscopy, urethral dilatation, hydrodistension, and bladder  biopsies.   SURGEON:  Mark C. Vernie Ammons, M.D.   ANESTHESIA:  General mask.   SPECIMENS:  Cold cup bladder biopsies from the right wall, left wall, and  posterior wall.   ESTIMATED BLOOD LOSS:  Minimal.   COMPLICATIONS:  None.   INDICATIONS FOR PROCEDURE:  The patient is a 65 year old white female with a  PUFF score of 23.  She was sent for evaluation to rule out possible IC which  consists of six months of burning dysuria, urgency, frequency, some urge  incontinence, and feeling like she has a UTI with associated pelvic pain.  A  CT scan has revealed no evidence of pelvic pathology.  She has reflex  sympathetic dystrophy, fibromyalgia, and IBS.  She is brought to the OR for  surgical evaluation and treatment and understands the risks, complications,  and alternatives.   DESCRIPTION OF PROCEDURE:  After informed consent, the patient was brought  to the OR and placed on the table.  She was administered general mask  anesthesia and then moved to the dorsal lithotomy position.  Her genitalia  were sterilely prepped and draped.   Initially, I performed urethral dilatation from 20 to 36 [place] using the  female sounds.   Cystoscopy was then performed using the 21 French cystoscope.  The bladder  was fully inspected and noted to be free of tumor, stones, or inflammatory  lesions using both 70 and 12-degree lenses.  The ureteral orifices were  of  normal configuration and position.  The urethra also appeared normal.   Photographs were taken of the bladder prior to hydrodistension.  I then  distended the bladder to 80 cmH2O pressure and drained the bladder.  This  revealed bloody terminal effluent and a bladder capacity of 700 cc.  I then  inserted a 24 French Foley catheter into the bladder and hydrodistended  again to 80 cmH2O for approximately five minutes.  I then drained the  bladder and reinserted the cystoscope with the cold cup biopsy forceps and  obtained biopsies from the above-noted locations.  These locations were then  fulgurated with the Bugbee electrode.  I then instilled 5% Sensorcaine and  400 mg of Pyridium in a 15 cc solution into the bladder and injected 0.5%  Sensorcaine and 40 mg of Kenalog subtrigonally.  A small vaginal packing was  placed.   The patient was awakened and taken to the recovery room in stable and  satisfactory condition.  She tolerated the procedure well with no  intraoperative complications.  She will be given  a prescription for Vicodin  HP, #28, and she will follow up in my office in two weeks for recheck.      MCO/MEDQ  D:  12/30/2004  T:  12/30/2004  Job:  [id number]

## 2011-04-14 NOTE — Consult Note (Signed)
NAMEMADDUX, FIRST                 ACCOUNT NO.:  [id number]   MEDICAL RECORD NO.:  [id number]          PATIENT TYPE:  INP   LOCATION:  0153                         FACILITY:  [facility]   PHYSICIAN:  Tetlin Bing, M.D. LHCDATE OF BIRTH:  [birth year]   DATE OF CONSULTATION:  09/22/2005  DATE OF DISCHARGE:                                   CONSULTATION   REQUESTING PHYSICIAN:  Sean A. Everardo All, M.D.   PRIMARY CARE PHYSICIAN:  Valetta Mole. Swords, M.D.   HISTORY OF PRESENT ILLNESS:  A 65 year old woman referred for assessment of  postoperative hypotension, chest discomfort, and pulmonary edema.   Ms. Szwed has a complex history of medical illnesses that include  fibromyalgia, reflex sympathetic dystrophy, and IBS.  She has not had known  cardiac disease.  A stress Cardiolite study performed two years ago was  negative.  She was found to have cholelithiasis after developing right upper  quadrant discomfort and underwent laparoscopic cholecystectomy yesterday.  This morning, she developed hypotension, prompting transfer to the ICU.  She  also complained of poorly characterized chest tightness and abdominal  discomfort of moderate severity.  There was no associated dyspnea or  diaphoresis.  The pain was not radiating.  She has had recurrent emesis with  absent bowel sounds and a presumed ileus.   PAST MEDICAL HISTORY:  Otherwise notable for migraine headache, GERD with  hiatal hernia, anxiety and B12 deficiency.  She has previously been  diagnosed as having lupus and has undergone a hysterectomy.   MEDICATIONS ON ADMISSION:  Include Duragesic, Enablex, Cellcept, Xanax,  Zelnorm, and omeprazole.   SOCIAL HISTORY:  Married and lives in [place].  Disabled.  No tobacco or  alcohol use.   FAMILY HISTORY:  Mother is deceased due to carcinoma of the esophagus.  Father died at age 67 with a history of MI and cancer.  A brother suffered  an MI at age 41.   REVIEW OF SYSTEMS:  Notable for the  need for corrective lenses, frequent  headaches, dentures, an episode of syncope five years ago, nocturia,  anxiety, diffuse arthralgias, and chronic abdominal pain.   PHYSICAL EXAMINATION:  VITAL SIGNS:  Temperature 99.5, heart rate 80 and  regular, respirations 12, blood pressure 100/60 on dopamine (5 mcg/kg).  I's  and O's since surgery are +1 liter with relatively scant urine output.  O2  saturation 100% on room air.  GENERAL:  A lethargic, thin woman in no apparent distress.  HEENT:  Anicteric sclerae.  NECK:  No jugular venous distention.  Faint bilateral carotid bruits.  ENDOCRINE:  No thyromegaly.  HEMATOPOIETIC:  No adenopathy.  LUNGS:  Grossly clear laterally.  HEART:  Normal first and second heart sounds.  Modest systolic ejection  murmur.  Fourth heart sound present.  ABDOMEN:  Silent.  No marked tenderness.  EXTREMITIES:  Distal pulses intact.  No edema.  NEUROMUSCULAR:  Moves extremities equally.  MUSCULOSKELETAL:  No joint deformities.   Hemoglobin 10.8 today, 12.1 preoperatively.  Other laboratories pending.   EKG:  Normal sinus rhythm.  Within normal limits.   CHEST X-RAY:  Increased interstitial markings.  Prominent gastric bubble.   ECHOCARDIOGRAM:  Briefly reviewed at bedside.  Left ventricular size and  function were normal.  There were no apparent valvular abnormalities.   IMPRESSION:  Ms. Kolek presents with chest discomfort and hypotension  following laparoscopic cholecystectomy.  She has improved with fluid  resuscitation and transfusion of 1/2 unit of blood so far.  She has no  physical findings to suggest congestive heart failure.  The x-ray is not  diagnostic for pulmonary edema.  I would continue fluid administration and  taper dopamine as possible.  Serial CBCs should be monitored to rule out  postoperative bleeding.  I agree with plans for serial EKGs and cardiac  markers.  A BNP level is pending.  We appreciate the request for  consultation and  will be happy to follow this patient with you.      [place] Bing, M.D. [facility]  Electronically Signed     RR/MEDQ  D:  09/22/2005  T:  09/22/2005  Job:  [id number]

## 2011-04-14 NOTE — Discharge Summary (Signed)
NAMEMARYJO, RAGON                 ACCOUNT NO.:  [id number]   MEDICAL RECORD NO.:  [id number]          PATIENT TYPE:  IPS   LOCATION:  4004                         FACILITY:  [facility]   PHYSICIAN:  Ellwood Dense, M.D.   DATE OF BIRTH:  20-Jun-1946   DATE OF ADMISSION:  01/10/2007  DATE OF DISCHARGE:  01/17/2007                               DISCHARGE SUMMARY   DISCHARGE DIAGNOSES:  1. Severe deconditioning secondary to Escherichia coli, urosepsis, and      Takotsubo cardiomyopathy.  2. Takotsubo cardiomyopathy with improved ejection fraction.  3. 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  4. Dysphagia, resolved.  5. Stress disorder.  6. Hypoglycemia secondary to tube feeds, resolved.   HISTORY OF PRESENT ILLNESS:  [name] is a 65 year old female with a  history of fibromyalgia, B12 deficiency, admitted January 31 with back  pain, fever and chills, noted to be dehydrated and septic secondary to  E. coli UTI.  Patient was intubated secondary to hypoxia requiring  treatment with IV fluids, as well as IV antibiotics.  Urine culture and  blood cultures positive for E. coli.  Patient also noted to have EKG  changes with elevated cardiac enzymes and Dr. Eden Emms was consulted for  treatment of acute MI.  TEE done showed EF of 20% with global  hypokinesis, most likely secondary to Takotsubo disease or form of  septic cardiomyopathy.  Patient was extubated February 8 and noted to  have issues with dysphagia with delay in swallowing residue.  She was  started on D2 diet, nectar thick liquids and currently with poor  tolerance of this.  A repeat echocardiogram done on February 14,  revealing moderate dilated LV, LVEF of 40-45%.  As for the issues  regarding chest pain, low-dose Coreg was added.  Cardiology feels  symptoms most likely musculoskeletal.  Therapies were initiated and  patient is noted to have weakness with buckling of lower extremities, as  well as pain.  Has been  able to ambulate a few feet.  Rehab consulted  for progressive therapies.   PAST MEDICAL HISTORY:  Significant for:  1. Reflex sympathetic dystrophy, right lower extremity, secondary to      foot fracture, and right upper extremity secondary to trauma.  2. Atrial deficiency.  3. Migraine headaches.  4. O2 immune rash.  5. Hysterectomy.  6. Mixed connective tissue disorder.  7. Bilateral salpingo-oophorectomy.  8. Cholecystectomy.  9. Stress disorder.  10.Chronic pain.  11.Cystitis with occasional treatment.   ALLERGIES:  NO KNOWN DRUG ALLERGIES.   FAMILY HISTORY:  Positive for coronary artery disease and cancer.   SOCIAL HISTORY:  Patient is married, lives in 1-level home, with 4 steps  at entry.  Husband retired and supportive.  A daughter and sisters who  can provide assistance past discharge.  Does not use any tobacco.  Does  not use any alcohol.  Family assists with housework.  Patient was  independent and driving prior to admission.   HOSPITAL COURSE:  [name] was admitted to rehab on January 10, 2007 for inpatient therapy to consists  of PT/OT daily.  Past admission,  initially she was maintained on D2 diet, nectar thick liquids, noted to  have renal insufficiency and it was recommended to push fluids.  Her  stress disorder was managed with scheduled use of Klonopin.  The  patient's tube feeds had been discontinued and the patient was noted to  have some issues with hypoglycemia while on tube feeds.  Therefore, CBGs  were initially checked for first 72 hours, revealing blood sugars from  100-mid 110s.  Hemoglobin A1c was noted to be within normal range and  CBG checks were discontinued.  Labs checked during this admission,  includes hemoglobin A1c at 5.8.  Electrolytes revealed a sodium of 137,  potassium 4.0, chloride 99, CO2 31, BUN 20, creatinine 0.6, glucose 118.  CBC revealed hemoglobin 10.7, hematocrit 32.3, white count 6.3,  platelets 356.  A repeat  swallow evaluation was done, at bedside, and  patient was advanced to D3 nectar and strength and endurance were  improving.  A swallow study of February 19, showed resolution of  dysphagia with the patient being advanced to regular diet and liquids.   Patient has been participating in therapy, limited secondary complaints  of fatigue intermittently.  By the time of discharge, patient was at  supervision level for sit to stand transfers, able to ambulate 100 feet  with a rolling walker supervision, able to navigate 4 steps with rolling  walker with minimum assists.  Her dynamic standing balance was, for  challenging activities, impaired.  Patient was at supervision level for  self care needs.  Speech therapy evaluation shows speech and language  within functional limits.  It is mainly occasional delays and  hesitations in expression.  Patient also with midline cognitive deficits  in areas of walking, memory, taught, organization.  Initially, this was  within functional limits, by the time of discharge.  Patient will be  continue to receive further followup home health PT/OT by home health  past discharge.  Family to provide supervision for now.  Patient did  report some bladder pressure at time of discharge, felt this was similar  to symptoms she had prior to issues with UTI; therefore, patient had a  UA/UC sent off, results currently pending.  She was started on Cipro 250  mg b.i.d. for 7 days.  We will contact her, once cultures finalize, if  antibiotics need to be changed.  On January 17, 2007, patient is  discharged to home.   DISCHARGE MEDICATIONS:  1. Klonopin 0.5 mg p.o. every 12 hours.  2. Fentanyl patch 50 mcg change 3 days.  3. Lasix 20 mg every a.m.  4. Lyrica 50 mg every 8 hours.  5. Coreg 3.25 mg half p.o. b.i.d.  6. Lanoxin 0.35 mg a day.  7. Protein supplements with Ensure b.i.d.  8. Omeprazole 20 mg a day.  9. Multivitamin with iron 1 per day. 10.Ultram 50 mg every  6-8 hours p.r.n. pain.  11.Cipro 250 mg b.i.d.   DIET:  Low salt.   ACTIVITY:  As tolerated with supervision.  No strenuous activity.  No  alcohol.  No smoking.  No driving.   SPECIAL INSTRUCTIONS:  Do not use Xanax, use Fentanyl patch 50 mcg an  hour for now.   FOLLOWUP:  With Dr. Cato Mulligan regarding further titration of Fentanyl patch  to home dose of 75.  Patient to follow up with Dr. Thomasena Edis as needed.  Follow up with Dr. Cato Mulligan February 27 at 1:20.  Follow up  with Dr.  Dietrich Pates for treatment in 2-3 weeks.      Greg Cutter, P.A.    ______________________________  Ellwood Dense, M.D.    PP/MEDQ  D:  01/18/2007  T:  01/19/2007  Job:  [id number]   cc:   Valetta Mole. Cato Mulligan, MD  Gerrit Friends. Dietrich Pates, MD, Cristela Blue, M.D.

## 2011-04-14 NOTE — Assessment & Plan Note (Signed)
[facility]                            CARDIOLOGY OFFICE NOTE   BAELYNN, SCHMUHL                        MRN:          [id number]  DATE:03/14/2007                            DOB:          03-Apr-1946    Stefanee returns today for follow up. I initially saw her when she was  septic and critically ill with multisystem failure at [facility].  Bedside TEE that was done for EKG changes and positive enzymes showed  marked LV dysfunction with an EF of 10%. It was not clear as to whether  the patient had an infarct, Takotsubo disease, or cardiomyopathy of  sepsis. She was treated aggressively for her sepsis. She had a cardiac  MRI done on April 12 that showed remarkable improvement in LV function  to normal with good LV size and an EF of 67% which was quantitated using  Simpson's rule and endocardial tracings. More importantly, there was no  hyper enhancement and no scar tissue. This would indicate that the  patient does not have coronary disease and that her cardiomyopathy was  either a Takotsubo or septic PCM type of picture. In regard to CHF she  has not had any dyspnea, PND, orthopnea or edema.   In light of this, I told the patient to stop her Digoxin. She does not  need a right and left heart catheterization. Overall, she has been doing  well.   REVIEW OF SYSTEMS:  Her 10 point review of systems are remarkable for  some chronic disability from reflex sympathetic dystrophy in her elbow  and her leg. She has been on a Phentanyl patch for over 10 years. Her 10  point review of systems is otherwise negative.   MEDICATIONS:  1. Klonopin 0.5 mg b.i.d.  2. Phentanyl patch 50 mcg daily.  3. Digoxin to be stopped.  4. Omeprazole 20 mg daily.   PHYSICAL EXAMINATION:  GENERAL:  She continues to be somewhat frail and  thin.  VITAL SIGNS:  Blood pressure 112/70, pulse 70 and regular.  HEENT:  Normal.  NECK:  Carotids are normal without bruit.  LUNGS:   Clear.  CARDIAC:  S1, S2. Normal heart sounds.  ABDOMEN:  Benign.  EXTREMITIES:  Intact pulses, no edema.  NEUROLOGIC:  Non-focal.   IMPRESSION:  Cardiomyopathy resolved, likely related to severe sepsis  and multiorgan failure. Follow up quick look echocardiogram in six  months to document continued normal LV function. If this is normal, the  patient will probably be able to be followed by Dr. Cato Mulligan alone. She  will continue with a baby aspirin a day. The patient does not need  Digoxin anymore. There is a  weak positive inotrope since her LV function is normal. I will see the  patient back in six months and document persistent improvement in LV  function.     [name]. Eden Emms, MD, [facility]  Electronically Signed    PCN/MedQ  DD: 03/14/2007  DT: 03/15/2007  Job #: [id number]

## 2011-04-14 NOTE — H&P (Signed)
[place]. [facility]  Patient:    [name], [name]                        MRN: [id number] Adm. Date:  [id number] Attending:  Tresa Garter CC:         Valetta Mole. Swords, M.D. [facility]   History and Physical  DATE OF BIRTH: 31-May-1946  CHIEF COMPLAINT: Chest pain.  HISTORY OF PRESENT ILLNESS: The patient is a 65 year old white female with multiple medical problems, who presents with complaint of pressure-like severe chest pain that developed earlier today and was accompanied by shaking and weakness.  It was going into her left arm and it felt numb.  There was no syncope and no nausea or vomiting.  Denies GI complaints.  PAST MEDICAL HISTORY:  1. Recent rash of unknown etiology, treated by Dr. Cato Mulligan with steroids (last     new elements on Monday, last steroid dose yesterday).  2. B-12 deficiency.  3. Fibromyalgia.  4. RSD, right arm.  5. Possible lupus according to recent tests by Dr. Cato Mulligan.  FAMILY HISTORY: Brother had MI at age 13.  Father died with MI at age 65.  SOCIAL HISTORY: She is on disability.  She is married with children.  Does not smoke or drink.  CURRENT MEDICATIONS:  1. Xanax, unknown dose.  2. Premarin, unknown dose.  3. Duragesic 50 mcg patch.  4. Hydroxyzine p.r.n.  ALLERGIES: SULFA.  REVIEW OF SYSTEMS: Chronic problems with right arm.  Started to shake after the first dose of steroids.  No syncope.  No previous exertional symptoms. Chronic aches and pains, otherwise negative.  PHYSICAL EXAMINATION:  VITAL SIGNS: Blood pressure 114/84, heart rate 81, respirations 21.  Oxygen saturation 99% on room air.  GENERAL: The patient appears chronically ill.  She is in no acute distress. Looks fatigued.  HEENT: Moist mucosa.  NECK: Supple.  No thyromegaly.  LUNGS: Clear to auscultation and percussion.  HEART: S1 and S2.  No murmur, no gallop, no rub.  No enlargement to percussion.  ABDOMEN: Soft, nontender.  No  organomegaly.  No masses.  EXTREMITIES: Without edema.  NEUROLOGIC: Cranial nerves 2-12 normal.  Extremity muscle strength normal. She is alert and oriented and cooperative.  SKIN: Multiple excoriations and healing erythematous papules.  LABORATORY DATA: EKG shows normal sinus rhythm.  Chest x-ray normal.  Sodium 135, potassium 3.4.  Hemoglobin 13.6, WBC 13.4, platelets 311,000.  CK 100.  Troponin 0.03.  ASSESSMENT/PLAN:  1. Atypical chest pain.  Rule out myocardial infarction.  Possible     gastrointestinal source.  Start Protonix empirically.  Troponin x 3.     CK x 3.  2. Shakiness, likely due to steroid side effect.  Will continue with Xanax.  3. Fibromyalgia and right arm reflex sympathetic dystrophy.  Continue with     usual Duragesic patch.  4. Resolving rash.  Would obtain skin biopsy if new elements appear.  5. Anxiety.  Continue with Xanax.  6. Elevated WBC due to steroids.DD:  06/12/01 TD:  06/13/01 Job: [id number] XL/KG401

## 2011-04-14 NOTE — Op Note (Signed)
[name], [name]                 ACCOUNT NO.:  [id number]   MEDICAL RECORD NO.:  [id number]          PATIENT TYPE:  INP   LOCATION:  0153                         FACILITY:  [facility]   PHYSICIAN:  Gabrielle Dare. Janee Morn, M.D.DATE OF BIRTH:  07-25-1946   DATE OF PROCEDURE:  09/22/2005  DATE OF DISCHARGE:                                 OPERATIVE REPORT   PREOPERATIVE DIAGNOSIS:  Hypotension.   POSTOPERATIVE DIAGNOSIS:  Hypotension.   PROCEDURE:  Insertion of right femoral vein central venous catheter for  central venous pressure monitoring.   SURGEON:  Gabrielle Dare. Janee Morn, M.D.   ANESTHESIA:  Local.   HISTORY OF PRESENT ILLNESS:  Patient is a 65 year old female who had a  laparoscopic cholecystectomy with cholangiogram yesterday.  This morning,  she developed chest pain and hypotension.  She was transferred to the  intensive care unit for resuscitation and evaluation.  We are placing  central venous catheter to administer medications and measure her central  venous pressure.   PROCEDURE IN DETAIL:  Informed consent was obtained.  The patient's right  groin was prepped and draped in a sterile fashion using the full body  protocol draping system.  Lidocaine 1% was injected in her right groin.  The  right femoral vein was accessed easily with brisk return.  Guidewire was  placed using a Seldinger technique.  A 30 cm triple lumen catheter was  inserted to about 25 cm.  There was excellent blood return from all three  ports with brisk flow.  Ports were flushed, and the line was sutured in  place.  Initial central venous pressure was 8.  Patient tolerated the  procedure well.  Sterile dressing was applied.      Gabrielle Dare Janee Morn, M.D.  Electronically Signed     BET/MEDQ  D:  09/22/2005  T:  09/22/2005  Job:  [id number]

## 2011-04-14 NOTE — H&P (Signed)
[name], [name]                 ACCOUNT NO.:  [id number]   MEDICAL RECORD NO.:  [id number]            PATIENT TYPE:   LOCATION:                                 FACILITY:   PHYSICIAN:  Bruce Rexene Edison. Swords, MD         DATE OF BIRTH:   DATE OF ADMISSION:  12/27/2006  DATE OF DISCHARGE:                              HISTORY & PHYSICAL   CHIEF COMPLAINT:  Weakness.   HISTORY OF PRESENT ILLNESS:  [name] is a 65 year old complicated  medical patient. She was in her usual state of health until  approximately three days ago. At that time she felt weak. Since that  time she has developed back pain, fever, and chills (no documented  fever). She feels diffusely weak. She aches all over. It is worth noting  that she did have a flu immunization this year. She has not been able to  keep any liquid  or fluids down. She has been having difficulty keeping  her medications down.   PAST MEDICAL HISTORY:  Significant for reflex sympathetic dystrophy of  her upper extremity. She has fibromyalgia. She has history of B12  deficiency, migraine headaches. She is thought to have an autoimmune  recurrent rash that has been evaluated at [facility] and at [facility]. Etiology  unknown, but it has been well-controlled with CellCept. There has been  some history of cortisol deficiency. She is status post hysterectomy and  bilateral salpingo-oophorectomy. She has a cholecystectomy.   SOCIAL HISTORY:  She is married. She is a nonsmoker. She has lots of  stress related to family issues.   FAMILY HISTORY:  Noncontributory. She has not had any significant ill  contacts.   REVIEW OF SYSTEMS:  She feels diffusely weak, fever, chills. She has  diffuse abdominal discomfort, but she denies pain. She has chronic  dysuria over the past month that is not any worse. She does admit to  back pain. She denies any other complaints in complete Review of Systems  other than those listed above.   PHYSICAL EXAMINATION:  VITAL SIGNS:   Temperature 98.2, pulse 116,  respirations 16, blood pressure 96/70.  GENERAL:  She appears as a chronically ill female in no acute distress.  She does appear mildly pale.  HEENT:  Atraumatic, normocephalic, extraocular muscles are intact.  Conjunctivae and oropharynx are moist. Conjunctivae are pink.  NECK:  Supple without lymphadenopathy, thyromegaly, jugular venous  distention or carotid bruits.  CHEST:  Clear to auscultation without any increased work of breathing.  CARDIAC:  S1, S2 are normal without murmurs or gallops.  ABDOMEN:  Active bowel sounds, soft, nontender. There is no  hepatosplenomegaly.  EXTREMITIES:  There is no clubbing, cyanosis or edema.  NEUROLOGIC:  She is alert and oriented.   LABORATORY DATA:  Laboratories will be done in the hospital.   ASSESSMENT:  Acute illness with subjective fevers and objective finding  of dehydration unclear etiology. I do not know if she has got the flu. I  do not know if she has developed pyelonephritis, perhaps urosepsis. She  could  be septic from some other reason. I think her main issue right now  is dehydration. She will be hospitalized for IV fluids. Will check CMET,  CBC, sed rate. Will check blood and urine cultures and UA. After the  laboratory work  will empirically start antibiotics given her history of fevers and  chills. I am hopeful that this will be a short hospitalization. I think  she is stable at this time to go to a medical floor bed, but if she gets  sicker she may need step down intensive care unit.      Bruce Rexene Edison Swords, MD  Electronically Signed     BHS/MEDQ  D:  12/27/2006  T:  12/27/2006  Job:  [id number]

## 2011-04-14 NOTE — Discharge Summary (Signed)
[name], [name]                 ACCOUNT NO.:  [id number]   MEDICAL RECORD NO.:  [id number]          PATIENT TYPE:  INP   LOCATION:  4730                         FACILITY:  [facility]   PHYSICIAN:  Valerie A. Felicity Coyer, MDDATE OF BIRTH:  06-Aug-1946   DATE OF ADMISSION:  12/27/2006  DATE OF DISCHARGE:                               DISCHARGE SUMMARY   DISCHARGE DIAGNOSES:  1. Escherichia coli urinary tract infection with bacteremia/septic      shock.  2. Acute respiratory distress syndrome status post extubation January 05, 2007.  3. Chronic pain syndrome secondary to fibromyalgia and reflex      sympathetic dystrophy.  4. Anemia.  5. Dysphagia.  6. Septic cardiomyopathy.  7. Hyperglycemia secondary to tube feedings.  8. History of autoimmune rash on CellCept prior to admission.  9. Dysphagia.   HISTORY OF PRESENT ILLNESS:  [name] is a 65 year old female who was  admitted on December 27, 2006 with chief complaint of weakness.  She had  been in her usual state of health until approximately 3 days prior to  admission at which time she felt weak.  She also developed back pain,  fever and chills with no documented fever and felt diffusely weak.  She  noted aches all over.  She reports it is difficult to keep down food or  liquid on admission, including difficulty keeping down her medication.  She was admitted for further evaluation and treatment.   PAST MEDICAL HISTORY:  1. Reflex sympathetic dystrophy of upper extremity.  2. Fibromyalgia.  3. B12 deficiency.  4. Migraine headaches.  5. Autoimmune recurrent rash that has been evaluated at [facility] and at      [facility].  Etiology unknown, but well controlled on CellCept.  6. History of cortisol deficiency.  7. Status post hysterectomy and bilateral salpingo-oophorectomy.  8. Cholecystectomy.   HOSPITAL COURSE:  1. E. coli UTI with bacteremia/septic shock.  The patient was admitted      and was seen on February 31, 2008 by  Pulmonary/Critical Care      Medicine due to her clinical status.  It was felt at that time that      the patient's sepsis syndrome was secondary to probable      pyelonephritis.  She required aggressive volume resuscitation and      was started on broad-spectrum antibiotics. CellCept was      discontinued.  She was treated with 10 days of Cipro for Cipro-      sensitive E. coli.  On December 28, 2006, the patient developed      acute respiratory distress requiring intubation and vent support.      The patient was extubated on January 05, 2007.   The patient returned to the primary care service on January 08, 2007.  1. Septic cardiomyopathy.  The patient was noted to develop pulmonary      edema and EKG changes.  She was also noted to have elevated MBs as      high as 32.3 and troponins as high as 5.5.  A cardiology  consult      was obtained and the patient was seen by Dr. Charlton Haws on      December 30, 2006,and it was felt that the patient suffered a      subendocardial myocardial infarction in the setting of severe      sepsis.  At that time, it was felt that she was not a candidate for      acute intervention and it was recommended that the patient continue      to be supported with drugs and intubation.  A 2-D echo was      performed on December 30, 2006 which noted global hypokinesis with      an EF of 20%, no apical thrombus.  A followup 2-D echo performed on      January 04, 2007 showed an improvement in the left ventricular      ejection fraction with a range of 40-45%.  At the time of this      dictation, cardiology has added a low dose of Coreg, as well as an      ACE inhibitor.  Lasix is being changed to p.o. and a followup echo      is being pursued.  Due to the patient's severe debilitation      secondary to acute illness, she will be transferred to the [facility] inpatient rehab for further treatment and rehabilitation prior      to discharge to home.  2. Dysphagia.   The patient was evaluated by speech therapy during this      admission.  She required temporary panda feedings.  She is      currently ordered for dysphagia II, nectar-thick diet with moderate      assistance.   PHYSICAL EXAMINATION:  VITAL SIGNS:  BP 94/56, heart rate 74,  respiratory rate 18, O2 sat 96% on room air, temperature 97.1.  GENERAL:  The patient is awake, alert, chronically ill, debilitated-  appearing female in no acute distress.  CARDIOVASCULAR:  S1, S2.  Regular rate and rhythm.  LUNGS:  Clear to auscultation bilaterally.  No wheezes, rales or  rhonchi.  ABDOMEN:  Soft, nontender, nondistended.  Positive bowel sounds.  EXTREMITIES:  Show no peripheral edema.   PERTINENT LABORATORY DATA:  At time of discharge:  BUN 24, creatinine  0.66, hemoglobin 9.4, hematocrit 27.9.   DISCHARGE MEDICATIONS:  1. Lyrica 50 mg p.o. q.8 hours.  2. Lovenox 40 mg subcu daily.  3. Protonix 40 mg by tube twice daily.  4. Xopenex 6 puffs q.6 h.  5. Lasix 20 mg p.o. daily.  6. Digoxin 0.25 mg p.o. daily.  7. Duragesic patch 50 mcg per hour to change every 72 hours.  8. Klonopin 0.25 mg p.o. q.12 h.  9. Zofran 4 mg IV q.6 h p.r.n.Marland Kitchen  10.Vicodin 1 tablet p.o. q.4 h p.r.n.  11.Thick-It instant food thickener as needed.   DISPOSITION:  The patient will be sent to [facility].   FOLLOWUP:  Upon discharge, she will need follow up with her primary care  [name] at [facility], [name].  Also, of note, UA and cultures  pending at time of this dictation.      Sandford Craze, NP      Raenette Rover. Felicity Coyer, MD  Electronically Signed    MO/MEDQ  D:  01/10/2007  T:  01/10/2007  Job:  [id number]   cc:   Valetta Mole. Swords, MD

## 2011-04-14 NOTE — H&P (Signed)
Tara, [name]                 ACCOUNT NO.:  [id number]   MEDICAL RECORD NO.:  [id number]          PATIENT TYPE:  IPS   LOCATION:  4004                         FACILITY:  [facility]   PHYSICIAN:  Ellwood Dense, M.D.   DATE OF BIRTH:  1945/12/24   DATE OF ADMISSION:  01/10/2007  DATE OF DISCHARGE:                              HISTORY & PHYSICAL   PRIMARY CARE PHYSICIAN:  Valetta Mole. Swords, M.D.   [name], M.D.   CARDIOLOGIST:  Gerrit Friends. Dietrich Pates, MD, [facility]   HISTORY OF PRESENT ILLNESS:  [name] is a 65 year old adult female with  history of fibromyalgia along with B12 deficiency.  She was admitted  December 27, 2006 with back pain along with fever and chills.  She was  noted to be dehydrated and was diagnosed with E coli sepsis.  She was  intubated secondary to hypoxemia and treated with IV fluids and IV  antibiotics.  Urine culture and blood culture came back positive for E  coli.   The patient required an EKG and also cardiac enzyme testing.  These  showed elevation, and Dr. Eden Emms was consulted for acute myocardial  infarction.  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  She was extubated on January 04, 2007,  and a swallow study was done that showed delayed swallow with residual.  She was placed on a D2 nectar-thick liquid diet.   Echocardiogram was completed January 10, 2007 prior to admission to the  rehabilitation unit.  It showed mild hypokinesis at the base and the mid-  septal and inferior wall, with an ejection fraction of 50% with heart  rate of 76 and blood pressure of 106/64.   The patient was felt to be stable, and the plan was to move her to the  rehabilitation unit.   The patient was evaluated by the rehabilitation physician and felt to be  an appropriate candidate for inpatient rehabilitation.   REVIEW OF SYSTEMS:   Positive for rash, weakness, numbness, bilateral  lower extremity pain, nocturia, and hematochezia.   PAST MEDICAL HISTORY:  1. 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  2. B12 deficiency.  3. Migraine headaches.  4. 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  5. Prior hysterectomy.  6. Mixed connective tissue disorder.  7. Chronic pain.  8. History of cystitis.  9. Prior cholecystectomy.  10.Bilateral salpingo-oophorectomy.  11.Stress disorder.   FAMILY HISTORY:  Positive for coronary artery disease and cancer.   SOCIAL HISTORY:  The patient is married and lives in a one-level home  with four steps to enter.  Her husband is retired, and a daughter can  provide supervision after discharge.  She also has support from two  sisters, although they live out of town.  The patient does not use  alcohol or tobacco.  She had been getting some assistance for housework  at home.   FUNCTIONAL  HISTORY PRIOR TO ADMISSION:  Independent in driving and  walking along with independent bathing and dressing.   ALLERGIES:  NO KNOWN DRUG ALLERGIES.   MEDICATIONS PRIOR TO ADMISSION:  1. Duragesic patch.  2. Lyrica.   LABORATORY DATA:  Recent hemoglobin was 9.4, hematocrit 27.9, platelet  count 304,000, white count 6.8.  Recent sodium was 144, potassium 4.0,  chloride 106, CO2 30, BUN 24, creatinine 0.7, and glucose 100.  Recent  calcium was 9.4 and recent digoxin 1.0 on January 04, 2007.   Chest x-ray on January 06, 2007 showed increased opacity of the left  lung base along with bilateral lower lung infiltrates.   PHYSICAL EXAMINATION:  GENERAL:  Well-appearing middle-aged, elderly  adult female lying in bed in mild acute discomfort.  VITAL SIGNS:  Blood pressure 94/56, with a pulse of 74, respiratory rate  18, and temperature of 97.1.  HEENT:  Normocephalic, atraumatic.  CARDIOVASCULAR:   Regular rate and rhythm.  S1 and S2 without murmurs.  ABDOMEN:  Soft, nontender, with positive bowel sounds.  LUNGS:  Clear to auscultation bilaterally.  NEUROLOGIC:  Alert and oriented x3 with a flat affect.  EXTREMITIES:  Bilateral upper extremity exam showed 3+ to 4-/5 strength  with questionable effort.  Bulk and tone were normal throughout the  upper extremities.  Lower extremity exam showed 3+ to 4-/5 strength,  with again poor effort in hip flexion, knee extension, and ankle  dorsiflexion.  Sensation was decreased to light touch throughout the  bilateral lower extremities.   IMPRESSION:  1. Status post acute sepsis/dehydration with development of Takotsubo      cardiomyopathy, stable.  2. Chronic pain related to history of reflex sympathetic dystrophy.  3. Dysphagia.   At the present time, the patient has deficits in activities of daily  living, transfers, and ambulation secondary to the above-noted sepsis  along with ventilator-dependent respiratory failure, short-term, and  development of cardiomyopathy.   PLAN:  1. Admit to the rehabilitation unit for daily therapies to include PT      for range of motion, strengthening, bed mobility, transfers, pre-      gait training, gait training, and equipment evaluation.  2. Occupational therapy for range of motion, strengthening, ADLs,      cognitive/perceptual training, splinting, and equipment evaluation.      3.  Have nursing for skin care, wound care, and bowel and bladder      training as needed.  3. Speech therapy for dysphagia evaluation.  4. Case management to assess home environment and assist with      discharge planning and arrange for appropriate followup care.  5. Social worker to assess family/social support, counsel the patient      regarding disability issues, and assist in discharge planning.  6. Check admission labs, including CBC and CMET Friday, January 11, 2007. 7. Continue fentanyl patch 50 mcg per  hour, change q.72h. along with      Lyrica 50 mg t.i.d. and Vicodin 5/500 one to two tablets p.o. q.4h.      p.r.n. for pain.  8. Continue Protonix 40 mg p.o. b.i.d.  9. Zofran 4 mg p.o. or IM q.6h. p.r.n.  10.Continue Coreg 3.125 mg one-half tablet p.o. b.i.d. along with      digoxin 0.25 mg daily and Lasix 20 mg daily for blood pressure      control and heart rate control.  11.Xopenex metered dose inhaler, six puffs q.6h.  12.Lovenox 40 mg  subcutaneously daily for DVT prophylaxis.  13.Klonopin 0.25 mg p.o. b.i.d.  14.Discontinue Foley tube in the morning and start bladder training.  15.Check hemoglobin AIC with morning labs.  16.Discontinue IV fluids if not already done.  17.Draw blood from IV.  18.Full supervision with meals with D2 nectar-thick liquids at      present.   PROGNOSIS:  Fair.   ESTIMATED LENGTH OF STAY:  10 to 20 days.   GOALS:  Modified independent to standby assist for ADLs, transfers, and  ambulation.           ______________________________  Ellwood Dense, M.D.     DC/MEDQ  D:  01/11/2007  T:  01/11/2007  Job:  [id number]

## 2011-04-14 NOTE — Op Note (Signed)
[name], [name]                 ACCOUNT NO.:  [id number]   MEDICAL RECORD NO.:  [id number]          PATIENT TYPE:  AMB   LOCATION:  DAY                          FACILITY:  [facility]   PHYSICIAN:  Gabrielle Dare. Janee Morn, M.D.DATE OF BIRTH:  05/26/1946   DATE OF PROCEDURE:  09/21/2005  DATE OF DISCHARGE:                                 OPERATIVE REPORT   PREOPERATIVE DIAGNOSIS:  Symptomatic cholelithiasis.   POSTOPERATIVE DIAGNOSIS:  Symptomatic cholelithiasis.   PROCEDURE:  Laparoscopic cholecystectomy with intraoperative cholangiogram.   SURGEON:  Gabrielle Dare. Janee Morn, M.D.   ASSISTANT:  Rose Phi. Maple Hudson, M.D.   ANESTHESIA:  General.   HISTORY OF PRESENT ILLNESS:  The patient is a 65 year old female who was  evaluated by Dr. Cato Mulligan for episodic right upper quadrant pain.  Ultrasound  revealed multiple gallstones and she presents for elective cholecystectomy.   PROCEDURE IN DETAIL:  The patient was identified.  Informed consent was  obtained.  She received intravenous antibiotics.  She was brought to the  operating room and general anesthesia was administered.  Her abdomen was  prepped and draped in a sterile fashion.  A 0.25% Marcaine with epinephrine  was injected into her infraumbilical area.  Infraumbilical incision was  made.  Subcutaneous tissues were dissected down.  Anterior fascia was  identified.  This was carefully divided and the peritoneal cavity was  entered gently under direct vision as she has had previous surgery.  A 0  Vicryl pursestring suture was placed around the fascial opening and the  Hasson trocar was inserted into the abdomen.  The abdomen was insufflated  with carbon dioxide in a standard fashion under direct vision.  An 11-mm  epigastric and two 5-mm lateral ports were placed.  The dome of the  gallbladder was retracted superomedially.  After the insufflation, there was  a small capsular rent in the liver where it had traction on the falciform  ligament.   Hemostasis there was obtained with Bovie cautery.   Attention was redirected to the gallbladder.  Dissection began laterally and  progressed medially along the infundibulum identifying the infundibular  cystic junction.  Further dissection was accomplished until a large window  was created between the cystic duct and the infundibulum of the gallbladder  and the liver.  Once this was accomplished with excellent visualization, a  clip was placed on the infundibulocystic junction.  A small nick was made in  the cystic duct and the Reddick cholangiogram catheter was inserted.  Intraoperative cholangiogram was obtained which identified good length of  cystic duct and no common bile duct filling defects.  Good contrast flow  into the duodenum was seen.  The cholangiogram catheter was removed.  Three  clips were placed proximally on the cystic duct and it was divided.  Further  dissection revealed an anterior branch of the cystic artery which was  clipped twice proximally, once distally, and divided.  The gallbladder was  taken off the liver bed with Bovie cautery.  We did encounter a posterior  branch of the cystic artery which was clipped twice proximally, once  distally, and divided.  The gallbladder was removed completely from the  liver bed.  There were a good bit of adhesions along there, likely due to  chronic inflammation.  The gallbladder was placed in an EndoCatch bag and  removed from the abdomen via the infraumbilical port site.  The liver bed  area was copiously irrigated and hemostasis was obtained with Bovie cautery.  The small area next to the falciform was also irrigated and cauterized to  get good hemostasis.  A small piece of Surgicel was placed there as well.  Irrigation fluid, a total of 2 L, was removed and this returned clear.  The  liver bed and area next to the falciform were rechecked and remained dry.  The ports were removed under direct vision.  The pneumoperitoneum  was  released.  The Hasson trocar was removed.  The infraumbilical fascia was  closed by tying the 0 Vicryl pursestring suture.  All four wounds were  copiously irrigated and the skin of each was closed with a running 4-0  Vicryl subcuticular stitch.  Sponge, needle, and instrument counts were  correct.  Benzoin, Steri-Strips, and sterile dressings were applied.  The  patient tolerated the procedure well without apparent complication.  She was  taken to the recovery room in stable condition.      Gabrielle Dare Janee Morn, M.D.  Electronically Signed     BET/MEDQ  D:  09/21/2005  T:  09/21/2005  Job:  [id number]   cc:   Valetta Mole. Swords, M.D. [facility]  [address] [place]  [place] [address]

## 2011-04-14 NOTE — Discharge Summary (Signed)
[name], Tara                 ACCOUNT NO.:  [id number]   MEDICAL RECORD NO.:  [id number]          PATIENT TYPE:  INP   LOCATION:  1406                         FACILITY:  [facility]   PHYSICIAN:  Gabrielle Dare. Janee Morn, M.D.DATE OF BIRTH:  [birth year]   DATE OF ADMISSION:  09/21/2005  DATE OF DISCHARGE:                                 DISCHARGE SUMMARY   DISCHARGE DIAGNOSES:  1.  Status post laparoscopic cholecystectomy with intraoperative      cholangiogram.  2.  Hypotension.   HISTORY OF PRESENT ILLNESS:  The patient is a 65 year old female with a  history of multiple medical problems including migraine headaches, anxiety  disorder, reflex sympathetic dystrophy, and fibromyalgia who presented for  elective cholecystectomy for symptomatic cholelithiasis. She underwent  laparoscopic cholecystectomy with intraoperative cholangiogram and was  admitted for postoperative care.   HOSPITAL COURSE:  The patient tolerated an uncomplicated cholecystectomy.  Cholangiogram was normal. Initially postoperatively, she remained stable  with good pain control. The morning of postoperative day #1 she developed  some chest tightness and hypotension. She was volume resuscitated and her  hypotension was initially not responsive to that. She was transferred to the  intensive care unit. Serial hemoglobin checks ruled out any active bleeding.  Cardiac enzymes were negative. I placed a central venous access and pressure  was supported with dopamine as she continued her workup. Mental status  remained intact. Cardiology consult was obtained and we continued volume  resuscitation, both with crystalloid and 1 unit of packed red blood cells.  The CT scan of the chest was obtained and pulmonary embolus was ruled out.  She had no signs or symptoms of sepsis. Gradually, over the next 2 days the  dopamine was weaned off, she maintained her pressure, usually in the 90s  systolic. She was followed by [facility] internal  medicine service as well very  closely during this postoperative period. Once her hemodynamics stabilized,  her central line was removed, physical therapy and occupational therapy  consultations were obtained. She mobilized gradually and is doing very well  today. She is ready for discharge in stable condition.   DISCHARGE DIET:  Low fat.   DISCHARGE INSTRUCTIONS:  No lifting. She is also to use a rolling walker  which Dr. Felicity Coyer has ordered for her.   DISCHARGE MEDICATIONS:  These include Percocet 5/325 one to two p.o. q.6h.  p.r.n. pain.   She is also to continue her list of home medication as previously. This  includes:  1.  Duragesic patch 100 mcg change q.72h.  2.  Enablex 15 mg p.o. q.a.m.  3.  Her CellCept was increased per Dr. Felicity Coyer to 500 mg b.i.d. until she      sees Dr. Cato Mulligan.  4.  Xanax 0.25 mg p.o. q.8h. p.r.n. anxiety.  5.  Zelnorm 6 mg p.o. q.a.m.  6.  Prilosec 20 mg p.o. q.a.m.  7.  Imitrex and Excedrin are p.r.n.   FOLLOW-UP:  Is with myself in 2 weeks and with Dr. Cato Mulligan on October 02, 2005.      Gabrielle Dare Janee Morn, M.D.  Electronically Signed     BET/MEDQ  D:  09/28/2005  T:  09/28/2005  Job:  [id number]   cc:   [facility]   Please FAX copy to office [name] M.D. [facility]

## 2011-04-17 ENCOUNTER — Ambulatory Visit (INDEPENDENT_AMBULATORY_CARE_PROVIDER_SITE_OTHER): Payer: Medicare Other | Admitting: Internal Medicine

## 2011-04-17 ENCOUNTER — Encounter: Payer: Self-pay | Admitting: Internal Medicine

## 2011-04-17 DIAGNOSIS — R52 Pain, unspecified: Secondary | ICD-10-CM

## 2011-04-17 DIAGNOSIS — N390 Urinary tract infection, site not specified: Secondary | ICD-10-CM

## 2011-04-17 LAB — POCT URINALYSIS DIPSTICK
Glucose, UA: NEGATIVE
Nitrite, UA: NEGATIVE
Protein, UA: NEGATIVE
Spec Grav, UA: 1.01
Urobilinogen, UA: 0.2

## 2011-04-17 MED ORDER — CIPROFLOXACIN HCL 500 MG PO TABS: 500.0000 mg | ORAL_TABLET | Freq: Two times a day (BID) | ORAL | Status: AC

## 2011-04-17 MED ORDER — FENTANYL 50 MCG/HR TD PT72: 1.0000 | MEDICATED_PATCH | TRANSDERMAL | Status: DC

## 2011-04-17 MED ORDER — TRIAMCINOLONE ACETONIDE 0.5 % EX CREA: TOPICAL_CREAM | Freq: Three times a day (TID) | CUTANEOUS | Status: DC

## 2011-04-17 NOTE — Progress Notes (Signed)
  Subjective:    Patient ID: [name], female    DOB: 10/28/1946, 65 y.o.   MRN: [id number]  HPI  Chronic pain syndrome---doing ok  Rash---minimal recurrence---improving  uti---recurrent---previously treated with trimethoprim  Past Medical History  Diagnosis Date  . B12 DEFICIENCY 05/03/2007  . FIBROMYALGIA 05/03/2007  . COLONIC POLYPS, HX OF 05/03/2007  . ANEMIA-NOS 09/25/2007  . HYPERLIPIDEMIA 12/19/2007  . HYPOTENSION, ORTHOSTATIC 12/06/2008  . OSTEOPOROSIS 11/10/2009  . MYOCARDIAL INFARCTION 01/25/2010  . 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 CARDIOMYOPATHY 02/08/2010  . LUPUS 02/08/2010  . CONGESTIVE HEART FAILURE, HX OF 02/08/2010  . GERD 02/22/2010   Past Surgical History  Procedure Date  . Cholecystectomy   . Abdominal hysterectomy     oophorectomy  . Cystoscopy     reports that she has never smoked. She does not have any smokeless tobacco history on file. She reports that she does not drink alcohol. Her drug history not on file. family history includes Breast cancer in her sister; Cancer in her mother; Colitis in her sister; Diabetes in her father; Heart attack in her father; Lung cancer in her brother; Ovarian cancer in her sister; Prostate cancer in her father; and Uterine cancer in her sister. Allergies  Allergen Reactions  . Imipramine Hcl     REACTION: rash  . Iohexol      Code: HIVES, Desc: PT developed 2 hives, followed by SOB, severe headache post 87cc's Omnipaque 300., Onset Date: [id number]   . Morphine Sulfate     REACTION: rash  . Sulfamethoxazole     REACTION: rash     Review of Systems  patient denies chest pain, shortness of breath, orthopnea. Denies lower extremity edema, abdominal pain, change in appetite, change in bowel movements. Patient denies rashes, musculoskeletal complaints (minimal right shoulder pain). No other specific complaints in a complete review of systems.      Objective:   Physical Exam  Well-developed well-nourished female  in no acute distress. HEENT exam atraumatic, normocephalic, extraocular muscles are intact. Neck is supple. No jugular venous distention no thyromegaly. Chest clear to auscultation without increased work of breathing. Cardiac exam S1 and S2 are regular. Abdominal exam active bowel sounds, soft, nontender. Extremities no edema. Neurologic exam she is alert without any motor sensory deficits. Gait is normal.        Assessment & Plan:  uti cipro for 7 days and then trimethoprim daily

## 2011-04-20 ENCOUNTER — Telehealth: Payer: Self-pay | Admitting: Internal Medicine

## 2011-04-20 LAB — URINE CULTURE

## 2011-04-20 NOTE — Telephone Encounter (Signed)
Triage vm-------saw her Dr on Monday and was put on Cipro 50 mg bid for a bladder infection. The medication is making her sick to her stomach. She did take with food. Please advice. Her pharmacy is [facility] in [place]. Cell #----[phone]

## 2011-04-21 ENCOUNTER — Encounter: Payer: Self-pay | Admitting: *Deleted

## 2011-04-21 MED ORDER — AMPICILLIN 500 MG PO CAPS: 500.0000 mg | ORAL_CAPSULE | Freq: Three times a day (TID) | ORAL | Status: AC

## 2011-04-21 NOTE — Telephone Encounter (Signed)
Per Dr Cato Mulligan stop cipro and start ampicillin 500 mg tid x 5 days

## 2011-04-21 NOTE — Telephone Encounter (Signed)
Pt aware, rx sent in electronically 

## 2011-05-08 ENCOUNTER — Ambulatory Visit (INDEPENDENT_AMBULATORY_CARE_PROVIDER_SITE_OTHER): Payer: Medicare Other | Admitting: Internal Medicine

## 2011-05-08 ENCOUNTER — Encounter: Payer: Self-pay | Admitting: Internal Medicine

## 2011-05-08 DIAGNOSIS — I428 Other cardiomyopathies: Secondary | ICD-10-CM

## 2011-05-08 DIAGNOSIS — G9050 Complex regional pain syndrome I, unspecified: Secondary | ICD-10-CM

## 2011-05-08 DIAGNOSIS — E785 Hyperlipidemia, unspecified: Secondary | ICD-10-CM

## 2011-05-08 DIAGNOSIS — R52 Pain, unspecified: Secondary | ICD-10-CM

## 2011-05-08 MED ORDER — FENTANYL 50 MCG/HR TD PT72: 1.0000 | MEDICATED_PATCH | TRANSDERMAL | Status: DC

## 2011-05-08 MED ORDER — TRIMETHOPRIM 100 MG PO TABS: 100.0000 mg | ORAL_TABLET | Freq: Two times a day (BID) | ORAL | Status: DC

## 2011-05-08 NOTE — Assessment & Plan Note (Signed)
Chronic sxs Continue meds

## 2011-05-08 NOTE — Progress Notes (Signed)
  Subjective:    Patient ID: [name], female    DOB: 1946-08-09, 65 y.o.   MRN: [id number]  HPI  Patient Active Problem List  Diagnoses  . B12 DEFICIENCY---monthly injections  . HYPERLIPIDEMIA---not on any meds  . ANEMIA-NOS---needs f/u  . REFLEX SYMPATHETIC DYSTROPHY---she has a chronic pain syndrome. RSD is part of it  .   .   .   . GERD---tolerating meds  . FIBROMYALGIA---part of pain meds  . OSTEOPOROSIS---no treatment at this time  . RASH-NONVESICULAR---no recurrence  .   Marland Kitchen    Recurrent UTI---currently she is not having any sxs  . Syncope---no recurrence    Past Medical History  Diagnosis Date  . B12 DEFICIENCY 05/03/2007  . FIBROMYALGIA 05/03/2007  . COLONIC POLYPS, HX OF 05/03/2007  . ANEMIA-NOS 09/25/2007  . HYPERLIPIDEMIA 12/19/2007  . HYPOTENSION, ORTHOSTATIC 12/06/2008  . OSTEOPOROSIS 11/10/2009  . MYOCARDIAL INFARCTION 01/25/2010  . 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 CARDIOMYOPATHY 02/08/2010  . LUPUS 02/08/2010  . CONGESTIVE HEART FAILURE, HX OF 02/08/2010  . GERD 02/22/2010   Past Surgical History  Procedure Date  . Cholecystectomy   . Abdominal hysterectomy     oophorectomy  . Cystoscopy     reports that she has never smoked. She does not have any smokeless tobacco history on file. She reports that she does not drink alcohol. Her drug history not on file. family history includes Breast cancer in her sister; Cancer in her mother; Colitis in her sister; Diabetes in her father; Heart attack in her father; Lung cancer in her brother; Ovarian cancer in her sister; Prostate cancer in her father; and Uterine cancer in her sister. Allergies  Allergen Reactions  . Imipramine Hcl     REACTION: rash  . Iohexol      Code: HIVES, Desc: PT developed 2 hives, followed by SOB, severe headache post 87cc's Omnipaque 300., Onset Date: [id number]   . Morphine Sulfate     REACTION: rash  . Sulfamethoxazole     REACTION: rash    Review of Systems  patient denies  chest pain, shortness of breath, orthopnea. Denies lower extremity edema, abdominal pain, change in appetite, change in bowel movements. Patient denies rashes, musculoskeletal complaints. No other specific complaints in a complete review of systems.      Objective:   Physical Exam  Well-developed well-nourished female in no acute distress. HEENT exam atraumatic, normocephalic, extraocular muscles are intact. Neck is supple. No jugular venous distention no thyromegaly. Chest clear to auscultation without increased work of breathing. Cardiac exam S1 and S2 are regular. Abdominal exam active bowel sounds, soft, nontender. Extremities no edema. Neurologic exam she is alert without any motor sensory deficits. Gait is normal.        Assessment & Plan:

## 2011-05-08 NOTE — Assessment & Plan Note (Signed)
No recurrent sxs This was likely related to sepsis (uti)

## 2011-05-08 NOTE — Assessment & Plan Note (Signed)
No treatment

## 2011-05-10 LAB — URINE CULTURE

## 2011-06-16 ENCOUNTER — Telehealth: Payer: Self-pay | Admitting: Internal Medicine

## 2011-06-16 DIAGNOSIS — R52 Pain, unspecified: Secondary | ICD-10-CM

## 2011-06-16 MED ORDER — FENTANYL 50 MCG/HR TD PT72: 1.0000 | MEDICATED_PATCH | TRANSDERMAL | Status: DC

## 2011-06-16 NOTE — Telephone Encounter (Signed)
Left message on machine Script is ready for pick up 

## 2011-06-16 NOTE — Telephone Encounter (Signed)
Pt requesting refill on fentaNYL (DURAGESIC - DOSED)  Pt will be in on the 7/24 to see Dr. Kirtland Bouchard

## 2011-06-20 ENCOUNTER — Ambulatory Visit: Payer: BC Managed Care – PPO | Admitting: Internal Medicine

## 2011-06-20 ENCOUNTER — Ambulatory Visit (INDEPENDENT_AMBULATORY_CARE_PROVIDER_SITE_OTHER): Payer: Medicare Other | Admitting: Internal Medicine

## 2011-06-20 ENCOUNTER — Ambulatory Visit: Payer: BC Managed Care – PPO

## 2011-06-20 ENCOUNTER — Encounter: Payer: Self-pay | Admitting: Internal Medicine

## 2011-06-20 DIAGNOSIS — N390 Urinary tract infection, site not specified: Secondary | ICD-10-CM

## 2011-06-20 DIAGNOSIS — D518 Other vitamin B12 deficiency anemias: Secondary | ICD-10-CM

## 2011-06-20 DIAGNOSIS — D649 Anemia, unspecified: Secondary | ICD-10-CM

## 2011-06-20 DIAGNOSIS — D519 Vitamin B12 deficiency anemia, unspecified: Secondary | ICD-10-CM

## 2011-06-20 DIAGNOSIS — L03039 Cellulitis of unspecified toe: Secondary | ICD-10-CM

## 2011-06-20 MED ORDER — CEPHALEXIN 500 MG PO CAPS: 500.0000 mg | ORAL_CAPSULE | Freq: Four times a day (QID) | ORAL | Status: AC

## 2011-06-20 MED ORDER — CYANOCOBALAMIN 1000 MCG/ML IJ SOLN
1000.0000 ug | Freq: Once | INTRAMUSCULAR | Status: AC
  Administered 2011-06-20: 1000 ug via INTRAMUSCULAR

## 2011-06-20 NOTE — Patient Instructions (Signed)
Soak right foot in warm water 3 times daily  Report any  worsening pain fever or drainage  Take your antibiotic as prescribed until ALL of it is gone, but stop if you develop a rash, swelling, or any side effects of the medication.  Contact our office as soon as possible if  there are side effects of the medication.  Call or return to clinic prn if these symptoms worsen or fail to improve as anticipated.

## 2011-06-20 NOTE — Progress Notes (Signed)
Addended by: Duard Brady I on: 06/20/2011 01:06 PM   Modules accepted: Orders

## 2011-06-20 NOTE — Progress Notes (Signed)
  Subjective:    Patient ID: [name], female    DOB: November 22, 1946, 65 y.o.   MRN: [id number]  HPI  65 year old patient who is seen today with a chief complaint of discomfort and redness involving her right great toe. She has been on trimethoprim  for chronic UTI suppression. She is on immunosuppressive therapy for a dermatitis. No fever chills or systemic complaints. There has been no drainage.    Review of Systems  Skin: Positive for rash.       Objective:   Physical Exam  Constitutional: She appears well-developed and well-nourished. No distress.  Skin:       A. mild  Left  great toe  Medial  paronychia; The proximal nail was cracked and missing. The proximal nail fold was loose. No fluctuance          Assessment & Plan:   Right great toe paronychia. We'll treat with cephalexin for 7 days. She will hold her trimethoprim for the next 7 days. Local wound care discussed. She will call if redness pain or drainage worsens

## 2011-07-10 ENCOUNTER — Ambulatory Visit: Payer: BC Managed Care – PPO | Admitting: Internal Medicine

## 2011-07-11 ENCOUNTER — Ambulatory Visit: Payer: BC Managed Care – PPO | Admitting: Internal Medicine

## 2011-07-11 ENCOUNTER — Encounter: Payer: Self-pay | Admitting: Internal Medicine

## 2011-07-11 ENCOUNTER — Ambulatory Visit (INDEPENDENT_AMBULATORY_CARE_PROVIDER_SITE_OTHER): Payer: Medicare Other | Admitting: Internal Medicine

## 2011-07-11 DIAGNOSIS — N390 Urinary tract infection, site not specified: Secondary | ICD-10-CM

## 2011-07-11 DIAGNOSIS — R21 Rash and other nonspecific skin eruption: Secondary | ICD-10-CM

## 2011-07-11 MED ORDER — CIPROFLOXACIN HCL 500 MG PO TABS: 500.0000 mg | ORAL_TABLET | Freq: Two times a day (BID) | ORAL | Status: AC

## 2011-07-11 NOTE — Patient Instructions (Signed)
Take your antibiotic as prescribed until ALL of it is gone, but stop if you develop a rash, swelling, or any side effects of the medication.  Contact our office as soon as possible if  there are side effects of the medication.  Call for dermatologic referral if rash fails to improve

## 2011-07-11 NOTE — Progress Notes (Signed)
  Subjective:    Patient ID: [name], female    DOB: 1946-08-30, 65 y.o.   MRN: [id number]  HPI  65 year old patient who is seen today for evaluation of a dermatitis involving her lower extremities. She was seen here last month and 2 for a left great toe paronychia with a seven-day course of cephalexin. This improved. The past 8 days she has developed a vesicular rash involving the primary left foot especially the great toe. She has some small excoriated lesions also involving the right foot. The left great toe has become more swollen and slightly red and painful    Review of Systems  Skin: Positive for rash.       Objective:   Physical Exam  Constitutional: She appears well-developed and well-nourished. No distress.       Temperature 98.4. No distress  Skin: Rash noted.       The left great toe is slightly swollen and erythematous the nail was absent and there was clear discharge from the proximal toe at the mid lobe base. There are scattered small ulcerations involving mainly the left great toe but also ventral surface of her second and third toe on the left there are healed the crusted lesions involving the right foot. There is minimal edema at this changes involving both feet          Assessment & Plan:   Cellulitis left great toe. The patient has a more generalized abruption involving both feet. She has a history of lupus and has been on low-dose immunosuppressive therapy for a similar dermatitis in the past. We'll treat with antibiotic therapy for 10 days. If rash fails to improve we'll set up to see dermatology.

## 2011-07-12 ENCOUNTER — Telehealth: Payer: Self-pay | Admitting: Internal Medicine

## 2011-07-12 NOTE — Telephone Encounter (Signed)
Increase cellcept to 500mg  po qd for 7 days and then back to 250mg  po qd

## 2011-07-12 NOTE — Telephone Encounter (Signed)
Notified pt. 

## 2011-07-12 NOTE — Telephone Encounter (Signed)
Pt called and is taking Cellcept 1/2 tab 250 mg a day. Pt is breaking out on foot. Should pt go back up to 500 mg a day?

## 2011-07-14 ENCOUNTER — Other Ambulatory Visit: Payer: Self-pay | Admitting: Internal Medicine

## 2011-07-19 ENCOUNTER — Telehealth: Payer: Self-pay | Admitting: Internal Medicine

## 2011-07-19 DIAGNOSIS — R52 Pain, unspecified: Secondary | ICD-10-CM

## 2011-07-19 MED ORDER — FENTANYL 50 MCG/HR TD PT72: 1.0000 | MEDICATED_PATCH | TRANSDERMAL | Status: DC

## 2011-07-19 NOTE — Telephone Encounter (Signed)
Pt would like to pick up a new Rx for her Duragesic patch today. She will be in [place] today and would like to save a trip. The patch is 50mg . Please call when ready.

## 2011-07-19 NOTE — Telephone Encounter (Signed)
Ready for pick up

## 2011-07-24 ENCOUNTER — Other Ambulatory Visit: Payer: Self-pay | Admitting: *Deleted

## 2011-07-24 DIAGNOSIS — F419 Anxiety disorder, unspecified: Secondary | ICD-10-CM

## 2011-07-24 MED ORDER — CLONAZEPAM 0.5 MG PO TABS: 0.5000 mg | ORAL_TABLET | Freq: Two times a day (BID) | ORAL | Status: DC | PRN

## 2011-08-18 ENCOUNTER — Ambulatory Visit (INDEPENDENT_AMBULATORY_CARE_PROVIDER_SITE_OTHER): Payer: Medicare Other | Admitting: Internal Medicine

## 2011-08-18 ENCOUNTER — Encounter: Payer: Self-pay | Admitting: Internal Medicine

## 2011-08-18 DIAGNOSIS — IMO0001 Reserved for inherently not codable concepts without codable children: Secondary | ICD-10-CM

## 2011-08-18 DIAGNOSIS — R52 Pain, unspecified: Secondary | ICD-10-CM

## 2011-08-18 DIAGNOSIS — R21 Rash and other nonspecific skin eruption: Secondary | ICD-10-CM

## 2011-08-18 DIAGNOSIS — I219 Acute myocardial infarction, unspecified: Secondary | ICD-10-CM

## 2011-08-18 DIAGNOSIS — E785 Hyperlipidemia, unspecified: Secondary | ICD-10-CM

## 2011-08-18 DIAGNOSIS — E538 Deficiency of other specified B group vitamins: Secondary | ICD-10-CM

## 2011-08-18 DIAGNOSIS — Z23 Encounter for immunization: Secondary | ICD-10-CM

## 2011-08-18 DIAGNOSIS — D649 Anemia, unspecified: Secondary | ICD-10-CM

## 2011-08-18 DIAGNOSIS — N390 Urinary tract infection, site not specified: Secondary | ICD-10-CM

## 2011-08-18 DIAGNOSIS — I951 Orthostatic hypotension: Secondary | ICD-10-CM

## 2011-08-18 LAB — CBC WITH DIFFERENTIAL/PLATELET
Basophils Relative: 0.6 % (ref 0.0–3.0)
Eosinophils Absolute: 0.1 10*3/uL (ref 0.0–0.7)
Hemoglobin: 11.1 g/dL — ABNORMAL LOW (ref 12.0–15.0)
MCHC: 32.8 g/dL (ref 30.0–36.0)
MCV: 85.1 fl (ref 78.0–100.0)
Monocytes Absolute: 0.3 10*3/uL (ref 0.1–1.0)
Neutro Abs: 1.8 10*3/uL (ref 1.4–7.7)
RBC: 3.98 Mil/uL (ref 3.87–5.11)

## 2011-08-18 LAB — HEPATIC FUNCTION PANEL
ALT: 11 U/L (ref 0–35)
AST: 18 U/L (ref 0–37)
Albumin: 4.2 g/dL (ref 3.5–5.2)
Alkaline Phosphatase: 55 U/L (ref 39–117)
Total Bilirubin: 0.4 mg/dL (ref 0.3–1.2)

## 2011-08-18 LAB — BASIC METABOLIC PANEL
Chloride: 107 mEq/L (ref 96–112)
GFR: 75.87 mL/min (ref >60.00)
Glucose, Bld: 95 mg/dL (ref 70–99)
Potassium: 5.2 mEq/L — ABNORMAL HIGH (ref 3.5–5.1)
Sodium: 144 mEq/L (ref 135–145)

## 2011-08-18 MED ORDER — CYANOCOBALAMIN 1000 MCG/ML IJ SOLN
1000.0000 ug | Freq: Once | INTRAMUSCULAR | Status: AC
  Administered 2011-08-18: 1000 ug via INTRAMUSCULAR

## 2011-08-18 MED ORDER — FENTANYL 50 MCG/HR TD PT72: 1.0000 | MEDICATED_PATCH | TRANSDERMAL | Status: DC

## 2011-08-21 LAB — URINALYSIS, ROUTINE W REFLEX MICROSCOPIC
Bilirubin Urine: NEGATIVE
Glucose, UA: NEGATIVE
Hgb urine dipstick: NEGATIVE
Ketones, ur: NEGATIVE
Ketones, ur: NEGATIVE
Nitrite: NEGATIVE
Nitrite: NEGATIVE
Protein, ur: NEGATIVE
Protein, ur: NEGATIVE
Specific Gravity, Urine: 1.01
Urobilinogen, UA: 0.2
Urobilinogen, UA: 0.2
pH: 7.5

## 2011-08-21 LAB — BASIC METABOLIC PANEL
BUN: 6
BUN: 6
BUN: 6
CO2: 30
CO2: 32
Calcium: 7.5 — ABNORMAL LOW
Calcium: 8.2 — ABNORMAL LOW
Calcium: 8.8
Calcium: 9.2
Calcium: 9.3
Chloride: 104
Chloride: 105
Creatinine, Ser: 0.63
Creatinine, Ser: 0.68
Creatinine, Ser: 0.65
Creatinine, Ser: 0.65
Creatinine, Ser: 0.73
GFR calc Af Amer: >60
GFR calc Af Amer: >60
GFR calc Af Amer: >60
GFR calc Af Amer: >60
GFR calc non Af Amer: >60
GFR calc non Af Amer: >60
GFR calc non Af Amer: >60
GFR calc non Af Amer: >60
GFR calc non Af Amer: >60
Glucose, Bld: 133 — ABNORMAL HIGH
Glucose, Bld: 106 — ABNORMAL HIGH
Glucose, Bld: 96
Glucose, Bld: 97
Glucose, Bld: 97
Potassium: 3.1 — ABNORMAL LOW
Potassium: 3.6
Potassium: 3.7
Potassium: 3.9
Sodium: 134 — ABNORMAL LOW
Sodium: 139
Sodium: 140
Sodium: 141
Sodium: 142

## 2011-08-21 LAB — COMPREHENSIVE METABOLIC PANEL
ALT: 13
Alkaline Phosphatase: 69
BUN: 10
CO2: 30
Calcium: 9.7
GFR calc non Af Amer: >60
Glucose, Bld: 109 — ABNORMAL HIGH
Potassium: 4.3
Total Protein: 6.5

## 2011-08-21 LAB — IRON AND TIBC
Saturation Ratios: 5 — ABNORMAL LOW
TIBC: 187 — ABNORMAL LOW
UIBC: 177

## 2011-08-21 LAB — CBC
HCT: 28.8 — ABNORMAL LOW
HCT: 31.5 — ABNORMAL LOW
HCT: 32.6 — ABNORMAL LOW
HCT: 35.4 — ABNORMAL LOW
Hemoglobin: 9.5 — ABNORMAL LOW
Hemoglobin: 9.7 — ABNORMAL LOW
Hemoglobin: 9.8 — ABNORMAL LOW
Hemoglobin: 10.6 — ABNORMAL LOW
Hemoglobin: 10.7 — ABNORMAL LOW
Hemoglobin: 10.9 — ABNORMAL LOW
Hemoglobin: 11.9 — ABNORMAL LOW
MCHC: 33.3
MCHC: 33.5
MCHC: 33.6
MCHC: 33.7
MCV: 81.8
MCV: 82.1
MCV: 82.4
MCV: 82.4
Platelets: 258
Platelets: 259
Platelets: 283
Platelets: 332
RBC: 3.45 — ABNORMAL LOW
RBC: 3.54 — ABNORMAL LOW
RBC: 3.58 — ABNORMAL LOW
RBC: 3.82 — ABNORMAL LOW
RBC: 3.95
RBC: 4.32
RDW: 13.2
RDW: 13.6
RDW: 13.8
RDW: 13.8
RDW: 14.1
WBC: 5.3
WBC: 4.7
WBC: 5
WBC: 5.1

## 2011-08-21 LAB — URINE MICROSCOPIC-ADD ON

## 2011-08-21 LAB — DIFFERENTIAL
Basophils Absolute: 0
Basophils Relative: 0
Basophils Relative: 1
Eosinophils Absolute: 0.1
Eosinophils Relative: 1
Eosinophils Relative: 3
Lymphocytes Relative: 35
Lymphs Abs: 1.8
Monocytes Absolute: 0.3
Monocytes Absolute: 0.3
Monocytes Relative: 5
Monocytes Relative: 6
Neutro Abs: 1.4 — ABNORMAL LOW
Neutro Abs: 2.9
Neutrophils Relative %: 58

## 2011-08-21 LAB — PROTIME-INR
INR: 1.5
Prothrombin Time: 18.1 — ABNORMAL HIGH

## 2011-08-21 LAB — HEPATIC FUNCTION PANEL
ALT: 17
ALT: 18
AST: 26
Albumin: 2.3 — ABNORMAL LOW
Albumin: 2.4 — ABNORMAL LOW
Alkaline Phosphatase: 58
Alkaline Phosphatase: 60
Indirect Bilirubin: 0.5
Total Bilirubin: 0.8
Total Protein: 4.8 — ABNORMAL LOW

## 2011-08-21 LAB — FERRITIN: Ferritin: 345 — ABNORMAL HIGH (ref 10–291)

## 2011-08-21 LAB — CLOSTRIDIUM DIFFICILE EIA: C difficile Toxins A+B, EIA: NEGATIVE

## 2011-08-21 LAB — B-NATRIURETIC PEPTIDE (CONVERTED LAB)
Pro B Natriuretic peptide (BNP): 124 — ABNORMAL HIGH
Pro B Natriuretic peptide (BNP): 46

## 2011-08-21 LAB — URINALYSIS, MICROSCOPIC ONLY
Bilirubin Urine: NEGATIVE
Glucose, UA: NEGATIVE
Hgb urine dipstick: NEGATIVE
Specific Gravity, Urine: 1.02

## 2011-08-21 LAB — FOLATE: Folate: >20

## 2011-08-21 LAB — RETICULOCYTES: RBC.: 3.79 — ABNORMAL LOW

## 2011-08-21 LAB — URINE CULTURE: Colony Count: 7000

## 2011-08-21 LAB — AMYLASE: Amylase: 36

## 2011-08-22 LAB — URINALYSIS, ROUTINE W REFLEX MICROSCOPIC
Ketones, ur: NEGATIVE
Protein, ur: NEGATIVE
Urobilinogen, UA: 0.2

## 2011-08-22 LAB — URINE CULTURE: Colony Count: 8000

## 2011-08-22 LAB — BASIC METABOLIC PANEL
CO2: 31
Calcium: 9.2
Glucose, Bld: 91
Sodium: 139

## 2011-08-22 LAB — CBC
MCHC: 33.3
MCV: 82.7
RDW: 14.3

## 2011-08-22 LAB — URINE MICROSCOPIC-ADD ON

## 2011-08-24 ENCOUNTER — Other Ambulatory Visit: Payer: Self-pay | Admitting: Internal Medicine

## 2011-08-27 NOTE — Assessment & Plan Note (Signed)
Has been well controlled. 

## 2011-08-27 NOTE — Assessment & Plan Note (Signed)
No recurrence. This was due to a septic event.

## 2011-08-27 NOTE — Progress Notes (Signed)
  Subjective:    Patient ID: [name], female    DOB: 11-14-1946, 65 y.o.   MRN: [id number]  HPI  Patient Active Problem List  Diagnoses  . B12 DEFICIENCY---monthly injections  . HYPERLIPIDEMIA---not on any meds  . ANEMIA-NOS---needs f/u  . REFLEX SYMPATHETIC DYSTROPHY---she has a chronic pain syndrome. RSD is part of it  .   .   .   . GERD---tolerating meds  . FIBROMYALGIA---part of pain meds  . OSTEOPOROSIS---no treatment at this time  . RASH-NONVESICULAR---no recurrence  .   Marland Kitchen    Recurrent UTI---currently she is not having any sxs  . Syncope---no recurrence    Past Medical History  Diagnosis Date  . B12 DEFICIENCY 05/03/2007  . FIBROMYALGIA 05/03/2007  . COLONIC POLYPS, HX OF 05/03/2007  . ANEMIA-NOS 09/25/2007  . HYPERLIPIDEMIA 12/19/2007  . HYPOTENSION, ORTHOSTATIC 12/06/2008  . OSTEOPOROSIS 11/10/2009  . MYOCARDIAL INFARCTION 01/25/2010  . 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 CARDIOMYOPATHY 02/08/2010  . LUPUS 02/08/2010  . CONGESTIVE HEART FAILURE, HX OF 02/08/2010  . GERD 02/22/2010   Past Surgical History  Procedure Date  . Cholecystectomy   . Abdominal hysterectomy     oophorectomy  . Cystoscopy     reports that she has never smoked. She does not have any smokeless tobacco history on file. She reports that she does not drink alcohol. Her drug history not on file. family history includes Breast cancer in her sister; Cancer in her mother; Colitis in her sister; Diabetes in her father; Heart attack in her father; Lung cancer in her brother; Ovarian cancer in her sister; Prostate cancer in her father; and Uterine cancer in her sister. Allergies  Allergen Reactions  . Imipramine Hcl     REACTION: rash  . Iohexol      Code: HIVES, Desc: PT developed 2 hives, followed by SOB, severe headache post 87cc's Omnipaque 300., Onset Date: [id number]   . Morphine Sulfate     REACTION: rash  . Sulfamethoxazole     REACTION: rash    Review of Systems  patient denies  chest pain, shortness of breath, orthopnea. Denies lower extremity edema, abdominal pain, change in appetite, change in bowel movements. Patient denies rashes, musculoskeletal complaints. No other specific complaints in a complete review of systems.      Objective:   Physical Exam  Well-developed well-nourished female in no acute distress. HEENT exam atraumatic, normocephalic, extraocular muscles are intact. Neck is supple. No jugular venous distention no thyromegaly. Chest clear to auscultation without increased work of breathing. Cardiac exam S1 and S2 are regular. Abdominal exam active bowel sounds, soft, nontender. Extremities no edema. Neurologic exam she is alert without any motor sensory deficits. Gait is normal.        Assessment & Plan:

## 2011-08-27 NOTE — Assessment & Plan Note (Signed)
No recent recurrence. 

## 2011-08-30 ENCOUNTER — Other Ambulatory Visit: Payer: Self-pay | Admitting: Internal Medicine

## 2011-09-18 ENCOUNTER — Other Ambulatory Visit: Payer: Self-pay | Admitting: Internal Medicine

## 2011-09-18 DIAGNOSIS — R52 Pain, unspecified: Secondary | ICD-10-CM

## 2011-09-18 MED ORDER — FENTANYL 50 MCG/HR TD PT72: 1.0000 | MEDICATED_PATCH | TRANSDERMAL | Status: DC

## 2011-09-18 NOTE — Telephone Encounter (Signed)
rx sent in called, pt aware

## 2011-09-18 NOTE — Telephone Encounter (Signed)
Pt would like a new rx fentanyl 50 mcg patches.

## 2011-09-26 ENCOUNTER — Other Ambulatory Visit: Payer: Self-pay | Admitting: Internal Medicine

## 2011-09-26 DIAGNOSIS — Z1231 Encounter for screening mammogram for malignant neoplasm of breast: Secondary | ICD-10-CM

## 2011-10-11 ENCOUNTER — Ambulatory Visit (INDEPENDENT_AMBULATORY_CARE_PROVIDER_SITE_OTHER): Payer: BC Managed Care – PPO | Admitting: Internal Medicine

## 2011-10-11 ENCOUNTER — Encounter: Payer: Self-pay | Admitting: Internal Medicine

## 2011-10-11 VITALS — BP 100/70 | Temp 98.2°F | Wt 130.0 lb

## 2011-10-11 DIAGNOSIS — R300 Dysuria: Secondary | ICD-10-CM

## 2011-10-11 DIAGNOSIS — I219 Acute myocardial infarction, unspecified: Secondary | ICD-10-CM

## 2011-10-11 DIAGNOSIS — IMO0001 Reserved for inherently not codable concepts without codable children: Secondary | ICD-10-CM

## 2011-10-11 LAB — POCT URINALYSIS DIPSTICK
Ketones, UA: NEGATIVE
Protein, UA: NEGATIVE
Spec Grav, UA: 1.01
Urobilinogen, UA: 0.2
pH, UA: 7

## 2011-10-11 MED ORDER — NITROFURANTOIN MONOHYD MACRO 100 MG PO CAPS: 100.0000 mg | ORAL_CAPSULE | Freq: Two times a day (BID) | ORAL | Status: AC

## 2011-10-11 NOTE — Progress Notes (Signed)
  Subjective:    Patient ID: [name], female    DOB: September 16, 1946, 65 y.o.   MRN: [id number]  HPI  65 year old patient who presents with a six-day history of urinary frequency some dysuria and low back pain no fever chills or flank pain. She has a history of chronic UTI and has been on trimethoprim chemoprophylaxis. Allergies include sulfa    Review of Systems  Genitourinary: Positive for dysuria and frequency. Negative for flank pain.       Objective:   Physical Exam  Constitutional: She appears well-nourished. No distress.          Assessment & Plan:   Early UTI. Will treat with Macrobid for 7 days Coronary artery disease Chronic pain syndrome with fibromyalgia

## 2011-10-11 NOTE — Patient Instructions (Signed)
Drink as much fluid as you  can tolerate over the next few days  Take your antibiotic as prescribed until ALL of it is gone, but stop if you develop a rash, swelling, or any side effects of the medication.  Contact our office as soon as possible if  there are side effects of the medication.  Call or return to clinic prn if these symptoms worsen or fail to improve as anticipated.   

## 2011-10-24 ENCOUNTER — Telehealth: Payer: Self-pay | Admitting: Internal Medicine

## 2011-10-24 DIAGNOSIS — R52 Pain, unspecified: Secondary | ICD-10-CM

## 2011-10-24 MED ORDER — FENTANYL 50 MCG/HR TD PT72: 1.0000 | MEDICATED_PATCH | TRANSDERMAL | Status: DC

## 2011-10-24 NOTE — Telephone Encounter (Signed)
rx up front ready for p/u, pt aware 

## 2011-10-24 NOTE — Telephone Encounter (Signed)
Pt requesting refill on fentaNYL (DURAGESIC - DOSED MCG/HR) 50 MCG/HR Please contact when ready to pick up

## 2011-11-07 ENCOUNTER — Other Ambulatory Visit: Payer: Self-pay | Admitting: Internal Medicine

## 2011-11-07 ENCOUNTER — Ambulatory Visit (INDEPENDENT_AMBULATORY_CARE_PROVIDER_SITE_OTHER): Payer: Medicare Other | Admitting: Internal Medicine

## 2011-11-07 ENCOUNTER — Ambulatory Visit: Payer: BC Managed Care – PPO

## 2011-11-07 ENCOUNTER — Encounter: Payer: Self-pay | Admitting: Internal Medicine

## 2011-11-07 VITALS — BP 120/72 | Temp 98.2°F | Wt 128.0 lb

## 2011-11-07 DIAGNOSIS — R300 Dysuria: Secondary | ICD-10-CM

## 2011-11-07 DIAGNOSIS — R21 Rash and other nonspecific skin eruption: Secondary | ICD-10-CM

## 2011-11-07 DIAGNOSIS — E538 Deficiency of other specified B group vitamins: Secondary | ICD-10-CM

## 2011-11-07 DIAGNOSIS — Z8679 Personal history of other diseases of the circulatory system: Secondary | ICD-10-CM

## 2011-11-07 DIAGNOSIS — N390 Urinary tract infection, site not specified: Secondary | ICD-10-CM

## 2011-11-07 DIAGNOSIS — G9050 Complex regional pain syndrome I, unspecified: Secondary | ICD-10-CM

## 2011-11-07 DIAGNOSIS — R52 Pain, unspecified: Secondary | ICD-10-CM

## 2011-11-07 DIAGNOSIS — D649 Anemia, unspecified: Secondary | ICD-10-CM

## 2011-11-07 MED ORDER — CIPROFLOXACIN HCL 500 MG PO TABS: 500.0000 mg | ORAL_TABLET | Freq: Two times a day (BID) | ORAL | Status: DC

## 2011-11-07 MED ORDER — CYANOCOBALAMIN 1000 MCG/ML IJ SOLN
1000.0000 ug | Freq: Once | INTRAMUSCULAR | Status: AC
  Administered 2011-11-07: 1000 ug via INTRAMUSCULAR

## 2011-11-07 MED ORDER — TRIMETHOPRIM 100 MG PO TABS: 100.0000 mg | ORAL_TABLET | Freq: Every day | ORAL | Status: DC

## 2011-11-07 MED ORDER — FENTANYL 50 MCG/HR TD PT72: 1.0000 | MEDICATED_PATCH | TRANSDERMAL | Status: DC

## 2011-11-07 NOTE — Assessment & Plan Note (Signed)
Recurrent UTI cipro for 7 days

## 2011-11-10 ENCOUNTER — Telehealth: Payer: Self-pay | Admitting: *Deleted

## 2011-11-10 LAB — URINE CULTURE

## 2011-11-10 MED ORDER — CEPHALEXIN 500 MG PO CAPS: 500.0000 mg | ORAL_CAPSULE | Freq: Three times a day (TID) | ORAL | Status: AC

## 2011-11-10 NOTE — Telephone Encounter (Signed)
Pt is vomiting, weak and upset stomach on cipro. This is getting worse with the cipro.

## 2011-11-10 NOTE — Telephone Encounter (Signed)
Per Dr Cato Mulligan stop  Cipro and take Keflex 500 mg tid x7 days.  Rx sent in electronically, pt aware

## 2011-11-10 NOTE — Telephone Encounter (Signed)
agree

## 2011-11-12 NOTE — Progress Notes (Signed)
Patient ID: [name], female   DOB: 08-28-1946, 65 y.o.   MRN: [id number] Complicated patient. She comes in for followup. She thinks she has a urinary tract infection. She has had multiple urinary tract infections before complicated with urosepsis and near death.  Orthostatic hypotension. She is not suffering from no symptoms currently on current medications.  Gastroesophageal reflux disease, currently no symptoms.  Unexplained rash which is currently asymptomatic.  Past Medical History  Diagnosis Date  . B12 DEFICIENCY 05/03/2007  . FIBROMYALGIA 05/03/2007  . COLONIC POLYPS, HX OF 05/03/2007  . ANEMIA-NOS 09/25/2007  . HYPERLIPIDEMIA 12/19/2007  . HYPOTENSION, ORTHOSTATIC 12/06/2008  . OSTEOPOROSIS 11/10/2009  . MYOCARDIAL INFARCTION 01/25/2010  . 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 CARDIOMYOPATHY 02/08/2010  . LUPUS 02/08/2010  . CONGESTIVE HEART FAILURE, HX OF 02/08/2010  . GERD 02/22/2010    History   Social History  . Marital Status: Married    Spouse Name: N/A    Number of Children: N/A  . Years of Education: N/A   Occupational History  . Not on file.   Social History Main Topics  . Smoking status: Never Smoker   . Smokeless tobacco: Not on file  . Alcohol Use: No  . Drug Use:   . Sexually Active:    Other Topics Concern  . Not on file   Social History Narrative  . No narrative on file    Past Surgical History  Procedure Date  . Cholecystectomy   . Abdominal hysterectomy     oophorectomy  . Cystoscopy     Family History  Problem Relation Age of Onset  . Cancer Mother     esophageal  . Heart attack Father   . Diabetes Father   . Prostate cancer Father   . Ovarian cancer Sister   . Uterine cancer Sister   . Breast cancer Sister   . Colitis Sister   . Lung cancer Brother     Allergies  Allergen Reactions  . Imipramine Hcl     REACTION: rash  . Iohexol      Code: HIVES, Desc: PT developed 2 hives, followed by SOB, severe headache post 87cc's  Omnipaque 300., Onset Date: [id number]   . Morphine Sulfate     REACTION: rash  . Sulfamethoxazole     REACTION: rash    Current Outpatient Prescriptions on File Prior to Visit  Medication Sig Dispense Refill  . clonazePAM (KLONOPIN) 0.5 MG tablet Take 1 tablet (0.5 mg total) by mouth 2 (two) times daily as needed.  30 tablet  3  . cyanocobalamin 1000 MCG/ML injection Inject 1,000 mcg into the muscle every 3 (three) months.        . fentaNYL (DURAGESIC - DOSED MCG/HR) 50 MCG/HR Place 1 patch (50 mcg total) onto the skin every 3 (three) days.  10 patch  0  . fexofenadine (ALLEGRA) 60 MG tablet Take 60 mg by mouth daily.        Marland Kitchen omeprazole (PRILOSEC) 20 MG capsule Take 20 mg by mouth daily.        . Omeprazole 20 MG TBEC TAKE 1 TABLET BY MOUTH EVERY DAY  90 tablet  1  . polyethylene glycol (GLYCOLAX) packet Take 17 g by mouth as needed.        . senna (SENOKOT) 8.6 MG tablet Take 1 tablet by mouth daily.        Marland Kitchen triamcinolone (KENALOG) 0.5 % cream APPLY TOPICALLY 3 (THREE) TIMES DAILY.  30 g  1  . mycophenolate (CELLCEPT) 500 MG tablet TAKE 1 TABLET EVERY DAY  30 tablet  1  . DISCONTD: trimethoprim (TRIMPEX) 100 MG tablet Take 1 tablet (100 mg total) by mouth 2 (two) times daily.  90 tablet  3     patient denies chest pain, shortness of breath, orthopnea. Denies lower extremity edema, abdominal pain, change in appetite, change in bowel movements. Patient denies rashes, musculoskeletal complaints. No other specific complaints in a complete review of systems.   BP 120/72  Temp(Src) 98.2 F (36.8 C) (Oral)  Wt 128 lb (58.06 kg) Chronically ill appearing female in no acute distress. HEENT exam atraumatic, normocephalic, extraocular muscles are intact. Neck is supple. No jugular venous distention no thyromegaly. Chest clear to auscultation without increased work of breathing. Cardiac exam S1 and S2 are regular. Abdominal exam active bowel sounds, soft, nontender. Extremities no edema.

## 2011-11-12 NOTE — Assessment & Plan Note (Signed)
Currently asymptomatic 

## 2011-11-12 NOTE — Assessment & Plan Note (Signed)
Likely related to sepsis. Resolved.

## 2011-11-12 NOTE — Assessment & Plan Note (Signed)
Symptoms are well controlled on current medications.

## 2011-12-21 ENCOUNTER — Ambulatory Visit (INDEPENDENT_AMBULATORY_CARE_PROVIDER_SITE_OTHER): Payer: Medicare Other | Admitting: Internal Medicine

## 2011-12-21 ENCOUNTER — Encounter: Payer: Self-pay | Admitting: Internal Medicine

## 2011-12-21 VITALS — BP 114/70 | HR 133 | Temp 100.9°F | Wt 128.0 lb

## 2011-12-21 DIAGNOSIS — I428 Other cardiomyopathies: Secondary | ICD-10-CM

## 2011-12-21 DIAGNOSIS — R309 Painful micturition, unspecified: Secondary | ICD-10-CM

## 2011-12-21 DIAGNOSIS — R300 Dysuria: Secondary | ICD-10-CM

## 2011-12-21 DIAGNOSIS — N390 Urinary tract infection, site not specified: Secondary | ICD-10-CM

## 2011-12-21 DIAGNOSIS — IMO0001 Reserved for inherently not codable concepts without codable children: Secondary | ICD-10-CM

## 2011-12-21 DIAGNOSIS — M329 Systemic lupus erythematosus, unspecified: Secondary | ICD-10-CM

## 2011-12-21 LAB — POCT URINALYSIS DIPSTICK
Bilirubin, UA: NEGATIVE
Glucose, UA: NEGATIVE
Spec Grav, UA: 1.02

## 2011-12-21 MED ORDER — NITROFURANTOIN MONOHYD MACRO 100 MG PO CAPS: 100.0000 mg | ORAL_CAPSULE | Freq: Two times a day (BID) | ORAL | Status: AC

## 2011-12-21 NOTE — Patient Instructions (Signed)
Tylenol 1-2 tabs po q4h prn  Take your antibiotic as prescribed until ALL of it is gone, but stop if you develop a rash, swelling, or any side effects of the medication.  Contact our office as soon as possible if  there are side effects of the medication.  Call or return to clinic prn if these symptoms worsen or fail to improve as anticipated.  

## 2011-12-21 NOTE — Progress Notes (Signed)
  Subjective:    Patient ID: [name], female    DOB: September 23, 1946, 66 y.o.   MRN: [id number]  HPI  66 year old patient who has a history of chronic recurrent tract infections that at times these have been quite severe and complicated by urosepsis. She presents with a three-day history of fever chills sweats and malaise she has had generalized achiness and headaches. She was seen in December and treated with a short course of Cipro for a UTI. Prior to that she had been on trimethoprim suppression but has been off all antibiotics for 3 or 4 weeks. The urinalysis was reviewed today and did reveal pyuria.  She has a history of lupus and has been on immunosuppressant therapy. She has fibromyalgia.    Review of Systems  Constitutional: Positive for fever, chills, diaphoresis and fatigue.  HENT: Negative for hearing loss, congestion, sore throat, rhinorrhea, dental problem, sinus pressure and tinnitus.   Eyes: Negative for pain, discharge and visual disturbance.  Respiratory: Negative for cough and shortness of breath.   Cardiovascular: Negative for chest pain, palpitations and leg swelling.  Gastrointestinal: Negative for nausea, vomiting, abdominal pain, diarrhea, constipation, blood in stool and abdominal distention.  Genitourinary: Positive for dysuria and difficulty urinating. Negative for urgency, frequency, hematuria, flank pain, vaginal bleeding, vaginal discharge, vaginal pain and pelvic pain.  Musculoskeletal: Negative for joint swelling, arthralgias and gait problem.  Skin: Negative for rash.  Neurological: Negative for dizziness, syncope, speech difficulty, weakness, numbness and headaches.  Hematological: Negative for adenopathy.  Psychiatric/Behavioral: Negative for behavioral problems, dysphoric mood and agitation. The patient is not nervous/anxious.        Objective:   Physical Exam  Constitutional: She is oriented to person, place, and time. She appears well-developed and  well-nourished.       Appears chronically ill but in no acute distress  temperature 100.9  HENT:  Head: Normocephalic.  Right Ear: External ear normal.  Left Ear: External ear normal.  Mouth/Throat: Oropharynx is clear and moist.  Eyes: Conjunctivae and EOM are normal. Pupils are equal, round, and reactive to light.  Neck: Normal range of motion. Neck supple. No thyromegaly present.  Cardiovascular: Normal rate, regular rhythm, normal heart sounds and intact distal pulses.   Pulmonary/Chest: Effort normal and breath sounds normal.  Abdominal: Soft. Bowel sounds are normal. She exhibits no mass. There is no tenderness.       Suprapubic tenderness  Musculoskeletal: Normal range of motion.  Lymphadenopathy:    She has no cervical adenopathy.  Neurological: She is alert and oriented to person, place, and time.  Skin: Skin is warm and dry. No rash noted.  Psychiatric: She has a normal mood and affect. Her behavior is normal.          Assessment & Plan:   Urinary tract infection recurrent. We'll check a urine C&S and treat with Macrobid  fibromyalgia with chronic pain  history of lupus on chronic immunosuppressant therapy

## 2011-12-24 LAB — URINE CULTURE: Colony Count: >=100000

## 2011-12-25 ENCOUNTER — Other Ambulatory Visit: Payer: Self-pay | Admitting: Internal Medicine

## 2011-12-25 ENCOUNTER — Ambulatory Visit (INDEPENDENT_AMBULATORY_CARE_PROVIDER_SITE_OTHER): Payer: Medicare Other | Admitting: Internal Medicine

## 2011-12-25 ENCOUNTER — Encounter: Payer: Self-pay | Admitting: Internal Medicine

## 2011-12-25 VITALS — BP 120/80 | HR 125 | Temp 98.1°F | Wt 123.0 lb

## 2011-12-25 DIAGNOSIS — N390 Urinary tract infection, site not specified: Secondary | ICD-10-CM

## 2011-12-25 DIAGNOSIS — IMO0001 Reserved for inherently not codable concepts without codable children: Secondary | ICD-10-CM

## 2011-12-25 DIAGNOSIS — R52 Pain, unspecified: Secondary | ICD-10-CM

## 2011-12-25 MED ORDER — FENTANYL 50 MCG/HR TD PT72: 1.0000 | MEDICATED_PATCH | TRANSDERMAL | Status: DC

## 2011-12-25 NOTE — Telephone Encounter (Signed)
rx up front ready for p/u, pt aware 

## 2011-12-25 NOTE — Telephone Encounter (Signed)
ok 

## 2011-12-25 NOTE — Telephone Encounter (Signed)
Pt need new rx fentanyl patches °

## 2011-12-25 NOTE — Patient Instructions (Signed)
Call or return to clinic prn if these symptoms worsen or fail to improve as anticipated. Return in one month for follow-up   

## 2011-12-25 NOTE — Progress Notes (Signed)
  Subjective:    Patient ID: [name], female    DOB: 04-Jan-1946, 66 y.o.   MRN: [id number]  HPI  66 year old patient who is seen today in followup. She was seen last week and treated for a suspected UTI. She was placed on Macrobid. She has a history of recurrent UTIs and also a history of urosepsis.  A urine culture was obtained last week they revealed Escherichia coli which was pansensitive to all antibiotics. Today she is somewhat improved she remains weak she does have a history of fibromyalgia.    Review of Systems  HENT: Negative for hearing loss, congestion, sore throat, rhinorrhea, dental problem, sinus pressure and tinnitus.   Eyes: Negative for pain, discharge and visual disturbance.  Respiratory: Negative for cough and shortness of breath.   Cardiovascular: Negative for chest pain, palpitations and leg swelling.  Gastrointestinal: Positive for abdominal pain. Negative for nausea, vomiting, diarrhea, constipation, blood in stool and abdominal distention.  Genitourinary: Negative for dysuria, urgency, frequency, hematuria, flank pain, vaginal bleeding, vaginal discharge, difficulty urinating, vaginal pain and pelvic pain.  Musculoskeletal: Negative for joint swelling, arthralgias and gait problem.  Skin: Negative for rash.  Neurological: Positive for weakness. Negative for dizziness, syncope, speech difficulty, numbness and headaches.  Hematological: Negative for adenopathy.  Psychiatric/Behavioral: Negative for behavioral problems, dysphoric mood and agitation. The patient is not nervous/anxious.        Objective:   Physical Exam  Constitutional: She is oriented to person, place, and time. She appears well-developed and well-nourished.  HENT:  Head: Normocephalic.  Right Ear: External ear normal.  Left Ear: External ear normal.  Mouth/Throat: Oropharynx is clear and moist.  Eyes: Conjunctivae and EOM are normal. Pupils are equal, round, and reactive to light.  Neck:  Normal range of motion. Neck supple. No thyromegaly present.  Cardiovascular: Normal rate, regular rhythm, normal heart sounds and intact distal pulses.   Pulmonary/Chest: Effort normal and breath sounds normal.  Abdominal: Soft. Bowel sounds are normal. She exhibits no mass. There is no tenderness.       Very mild lower abdominal tenderness improved compared to last week  Musculoskeletal: Normal range of motion.  Lymphadenopathy:    She has no cervical adenopathy.  Neurological: She is alert and oriented to person, place, and time.  Skin: Skin is warm and dry. No rash noted.  Psychiatric: Her behavior is normal.       Dysphoric mood          Assessment & Plan:   Escherichia coli urinary tract infection. Pansensitive. Will complete antibiotic therapy Followup PCP 1 month or when necessary Fibromyalgia

## 2012-01-16 ENCOUNTER — Other Ambulatory Visit: Payer: Self-pay | Admitting: Internal Medicine

## 2012-01-19 ENCOUNTER — Ambulatory Visit: Payer: Medicare Other | Admitting: Internal Medicine

## 2012-01-25 ENCOUNTER — Encounter: Payer: Self-pay | Admitting: Internal Medicine

## 2012-01-25 ENCOUNTER — Ambulatory Visit (INDEPENDENT_AMBULATORY_CARE_PROVIDER_SITE_OTHER): Payer: Medicare Other | Admitting: Internal Medicine

## 2012-01-25 DIAGNOSIS — N390 Urinary tract infection, site not specified: Secondary | ICD-10-CM

## 2012-01-25 DIAGNOSIS — R52 Pain, unspecified: Secondary | ICD-10-CM

## 2012-01-25 DIAGNOSIS — R21 Rash and other nonspecific skin eruption: Secondary | ICD-10-CM

## 2012-01-25 LAB — POCT URINALYSIS DIPSTICK
Ketones, UA: NEGATIVE
Nitrite, UA: POSITIVE
Urobilinogen, UA: 1
pH, UA: 6

## 2012-01-25 MED ORDER — PREDNISONE 10 MG PO TABS: ORAL_TABLET | ORAL | Status: DC

## 2012-01-25 MED ORDER — CIPROFLOXACIN HCL 250 MG PO TABS: 250.0000 mg | ORAL_TABLET | Freq: Two times a day (BID) | ORAL | Status: AC

## 2012-01-25 MED ORDER — FENTANYL 50 MCG/HR TD PT72: 1.0000 | MEDICATED_PATCH | TRANSDERMAL | Status: DC

## 2012-01-25 NOTE — Progress Notes (Signed)
Addended by: Lindley Magnus on: 01/25/2012 09:50 AM   Modules accepted: Orders

## 2012-01-25 NOTE — Progress Notes (Signed)
Patient ID: [name], female   DOB: 07/10/1946, 66 y.o.   MRN: [id number] Rash---recurrent- thought to be autoimmune by derm. She self started cellcept---has helped some in the past 2 weeks.  Recurrent UTI---she has some sxs today  Past Medical History  Diagnosis Date  . B12 DEFICIENCY 05/03/2007  . FIBROMYALGIA 05/03/2007  . COLONIC POLYPS, HX OF 05/03/2007  . ANEMIA-NOS 09/25/2007  . HYPERLIPIDEMIA 12/19/2007  . HYPOTENSION, ORTHOSTATIC 12/06/2008  . OSTEOPOROSIS 11/10/2009  . MYOCARDIAL INFARCTION 01/25/2010  . 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 CARDIOMYOPATHY 02/08/2010  . LUPUS 02/08/2010  . CONGESTIVE HEART FAILURE, HX OF 02/08/2010  . GERD 02/22/2010    History   Social History  . Marital Status: Married    Spouse Name: N/A    Number of Children: N/A  . Years of Education: N/A   Occupational History  . Not on file.   Social History Main Topics  . Smoking status: Never Smoker   . Smokeless tobacco: Not on file  . Alcohol Use: No  . Drug Use:   . Sexually Active:    Other Topics Concern  . Not on file   Social History Narrative  . No narrative on file    Past Surgical History  Procedure Date  . Cholecystectomy   . Abdominal hysterectomy     oophorectomy  . Cystoscopy     Family History  Problem Relation Age of Onset  . Cancer Mother     esophageal  . Heart attack Father   . Diabetes Father   . Prostate cancer Father   . Ovarian cancer Sister   . Uterine cancer Sister   . Breast cancer Sister   . Colitis Sister   . Lung cancer Brother     Allergies  Allergen Reactions  . Imipramine Hcl     REACTION: rash  . Iohexol      Code: HIVES, Desc: PT developed 2 hives, followed by SOB, severe headache post 87cc's Omnipaque 300., Onset Date: 13244010   . Morphine Sulfate     REACTION: rash  . Sulfamethoxazole     REACTION: rash    Current Outpatient Prescriptions on File Prior to Visit  Medication Sig Dispense Refill  . clonazePAM (KLONOPIN)  0.5 MG tablet Take 1 tablet (0.5 mg total) by mouth 2 (two) times daily as needed.  30 tablet  3  . cyanocobalamin 1000 MCG/ML injection Inject 1,000 mcg into the muscle every 3 (three) months.        . fentaNYL (DURAGESIC - DOSED MCG/HR) 50 MCG/HR Place 1 patch (50 mcg total) onto the skin every 3 (three) days.  10 patch  0  . fexofenadine (ALLEGRA) 60 MG tablet Take 60 mg by mouth daily.        . mycophenolate (CELLCEPT) 500 MG tablet TAKE 1 TABLET EVERY DAY  30 tablet  1  . Omeprazole 20 MG TBEC TAKE 1 TABLET BY MOUTH EVERY DAY  90 tablet  1  . polyethylene glycol (GLYCOLAX) packet Take 17 g by mouth as needed.        . senna (SENOKOT) 8.6 MG tablet Take 1 tablet by mouth daily.        Marland Kitchen triamcinolone cream (KENALOG) 0.5 % APPLY TOPICALLY 3 (THREE) TIMES DAILY.  30 g  1  . trimethoprim (TRIMPEX) 100 MG tablet Take 1 tablet (100 mg total) by mouth daily.      Marland Kitchen DISCONTD: omeprazole (PRILOSEC) 20 MG capsule Take 20 mg by mouth  daily.        Marland Kitchen DISCONTD: trimethoprim (TRIMPEX) 100 MG tablet Take 1 tablet (100 mg total) by mouth 2 (two) times daily.  90 tablet  3     patient denies chest pain, shortness of breath, orthopnea. Denies lower extremity edema, abdominal pain, change in appetite, change in bowel movements. Patient denies rashes, musculoskeletal complaints. No other specific complaints in a complete review of systems.   BP 108/64  Temp(Src) 98.2 F (36.8 C) (Oral)  Wt 128 lb (58.06 kg)  Well-developed well-nourished female in no acute distress. HEENT exam atraumatic, normocephalic, extraocular muscles are intact. Neck is supple. No jugular venous distention no thyromegaly. Chest clear to auscultation without increased work of breathing. Cardiac exam S1 and S2 are regular. Abdominal exam active bowel sounds, soft, nontender. Extremities no edema.  Derm: excoriative rash on arms and legs

## 2012-01-25 NOTE — Assessment & Plan Note (Signed)
Continue cellcept Prednisone taper See me in a few weeks

## 2012-01-28 LAB — URINE CULTURE

## 2012-01-30 ENCOUNTER — Other Ambulatory Visit: Payer: Self-pay | Admitting: *Deleted

## 2012-01-30 DIAGNOSIS — F419 Anxiety disorder, unspecified: Secondary | ICD-10-CM

## 2012-01-30 MED ORDER — CLONAZEPAM 0.5 MG PO TABS: 0.5000 mg | ORAL_TABLET | Freq: Two times a day (BID) | ORAL | Status: DC | PRN

## 2012-02-17 ENCOUNTER — Other Ambulatory Visit: Payer: Self-pay | Admitting: Internal Medicine

## 2012-02-19 ENCOUNTER — Other Ambulatory Visit: Payer: Self-pay | Admitting: Internal Medicine

## 2012-02-19 DIAGNOSIS — R52 Pain, unspecified: Secondary | ICD-10-CM

## 2012-02-19 NOTE — Telephone Encounter (Signed)
Pt need new rx duragesic patches

## 2012-02-20 MED ORDER — FENTANYL 50 MCG/HR TD PT72: 1.0000 | MEDICATED_PATCH | TRANSDERMAL | Status: DC

## 2012-02-20 NOTE — Telephone Encounter (Signed)
rx up front ready for p/u, pt aware 

## 2012-02-21 ENCOUNTER — Other Ambulatory Visit: Payer: Self-pay | Admitting: Internal Medicine

## 2012-02-29 ENCOUNTER — Ambulatory Visit (INDEPENDENT_AMBULATORY_CARE_PROVIDER_SITE_OTHER): Payer: Medicare Other | Admitting: Internal Medicine

## 2012-02-29 VITALS — BP 122/68 | Temp 98.0°F | Wt 133.0 lb

## 2012-02-29 DIAGNOSIS — E538 Deficiency of other specified B group vitamins: Secondary | ICD-10-CM

## 2012-02-29 DIAGNOSIS — R21 Rash and other nonspecific skin eruption: Secondary | ICD-10-CM

## 2012-02-29 DIAGNOSIS — N390 Urinary tract infection, site not specified: Secondary | ICD-10-CM

## 2012-02-29 DIAGNOSIS — Z8679 Personal history of other diseases of the circulatory system: Secondary | ICD-10-CM

## 2012-02-29 LAB — POCT URINALYSIS DIPSTICK
Bilirubin, UA: NEGATIVE
Ketones, UA: NEGATIVE
Spec Grav, UA: 1.015
pH, UA: 7.5

## 2012-02-29 MED ORDER — TRIMETHOPRIM 100 MG PO TABS: 100.0000 mg | ORAL_TABLET | Freq: Every day | ORAL | Status: DC

## 2012-02-29 MED ORDER — CIPROFLOXACIN HCL 500 MG PO TABS: 500.0000 mg | ORAL_TABLET | Freq: Two times a day (BID) | ORAL | Status: AC

## 2012-02-29 MED ORDER — CYANOCOBALAMIN 1000 MCG/ML IJ SOLN
1000.0000 ug | Freq: Once | INTRAMUSCULAR | Status: AC
  Administered 2012-02-29: 1000 ug via INTRAMUSCULAR

## 2012-02-29 NOTE — Progress Notes (Signed)
Patient ID: [name], female   DOB: 1946/03/27, 66 y.o.   MRN: [id number] Rash-thought to be autoimmune---much better on cellcept  uti---she has recurrent sxs of dysuria (note hx of sepsis related to uti--required intubation)  b12 deficiency---monthly injections  Past Medical History  Diagnosis Date  . B12 DEFICIENCY 05/03/2007  . FIBROMYALGIA 05/03/2007  . COLONIC POLYPS, HX OF 05/03/2007  . ANEMIA-NOS 09/25/2007  . HYPERLIPIDEMIA 12/19/2007  . HYPOTENSION, ORTHOSTATIC 12/06/2008  . OSTEOPOROSIS 11/10/2009  . MYOCARDIAL INFARCTION 01/25/2010  . 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 CARDIOMYOPATHY 02/08/2010  . LUPUS 02/08/2010  . CONGESTIVE HEART FAILURE, HX OF 02/08/2010  . GERD 02/22/2010    History   Social History  . Marital Status: Married    Spouse Name: N/A    Number of Children: N/A  . Years of Education: N/A   Occupational History  . Not on file.   Social History Main Topics  . Smoking status: Never Smoker   . Smokeless tobacco: Not on file  . Alcohol Use: No  . Drug Use:   . Sexually Active:    Other Topics Concern  . Not on file   Social History Narrative  . No narrative on file    Past Surgical History  Procedure Date  . Cholecystectomy   . Abdominal hysterectomy     oophorectomy  . Cystoscopy     Family History  Problem Relation Age of Onset  . Cancer Mother     esophageal  . Heart attack Father   . Diabetes Father   . Prostate cancer Father   . Ovarian cancer Sister   . Uterine cancer Sister   . Breast cancer Sister   . Colitis Sister   . Lung cancer Brother     Allergies  Allergen Reactions  . Imipramine Hcl     REACTION: rash  . Iohexol      Code: HIVES, Desc: PT developed 2 hives, followed by SOB, severe headache post 87cc's Omnipaque 300., Onset Date: 56213086   . Morphine Sulfate     REACTION: rash  . Sulfamethoxazole     REACTION: rash    Current Outpatient Prescriptions on File Prior to Visit  Medication Sig Dispense  Refill  . clonazePAM (KLONOPIN) 0.5 MG tablet Take 1 tablet (0.5 mg total) by mouth 2 (two) times daily as needed.  30 tablet  3  . cyanocobalamin 1000 MCG/ML injection Inject 1,000 mcg into the muscle every 3 (three) months.        . fentaNYL (DURAGESIC - DOSED MCG/HR) 50 MCG/HR Place 1 patch (50 mcg total) onto the skin every 3 (three) days.  10 patch  0  . fexofenadine (ALLEGRA) 60 MG tablet Take 60 mg by mouth daily.        . mycophenolate (CELLCEPT) 500 MG tablet TAKE 1 TABLET EVERY DAY  30 tablet  1  . Omeprazole 20 MG TBEC TAKE 1 TABLET BY MOUTH EVERY DAY  90 tablet  1  . polyethylene glycol (GLYCOLAX) packet Take 17 g by mouth as needed.        . senna (SENOKOT) 8.6 MG tablet Take 1 tablet by mouth daily.        Marland Kitchen triamcinolone cream (KENALOG) 0.5 % APPLY TOPICALLY 3 (THREE) TIMES DAILY.  30 g  1   No current facility-administered medications on file prior to visit.     patient denies chest pain, shortness of breath, orthopnea. Denies lower extremity edema, abdominal pain, change in appetite,  change in bowel movements. Patient denies rashes, musculoskeletal complaints. No other specific complaints in a complete review of systems.   BP 122/68  Temp(Src) 98 F (36.7 C) (Oral)  Wt 133 lb (60.328 kg)  Well-developed well-nourished female in no acute distress. HEENT exam atraumatic, normocephalic, extraocular muscles are intact. Neck is supple. No jugular venous distention no thyromegaly. Chest clear to auscultation without increased work of breathing. Cardiac exam S1 and S2 are regular. Abdominal exam active bowel sounds, soft, nontender. Extremities no edema. Derm: healing rash

## 2012-03-03 LAB — URINE CULTURE

## 2012-03-03 NOTE — Assessment & Plan Note (Signed)
i suspect she has uti See ua Check culture Will need chronic suppressive therapy (hx of sepsis)

## 2012-03-03 NOTE — Assessment & Plan Note (Signed)
No sxs This was almost certainly related to sepsis cardiomyopathy. Will remove from problem list

## 2012-03-03 NOTE — Assessment & Plan Note (Signed)
Much improved Continue same dose of cellcept at this time

## 2012-03-06 ENCOUNTER — Other Ambulatory Visit (INDEPENDENT_AMBULATORY_CARE_PROVIDER_SITE_OTHER): Payer: Medicare Other

## 2012-03-06 ENCOUNTER — Other Ambulatory Visit: Payer: Self-pay | Admitting: Internal Medicine

## 2012-03-06 DIAGNOSIS — D649 Anemia, unspecified: Secondary | ICD-10-CM

## 2012-03-06 DIAGNOSIS — R300 Dysuria: Secondary | ICD-10-CM

## 2012-03-06 LAB — CBC WITH DIFFERENTIAL/PLATELET
Basophils Absolute: 0 10*3/uL (ref 0.0–0.1)
Eosinophils Absolute: 0.1 10*3/uL (ref 0.0–0.7)
MCHC: 33 g/dL (ref 30.0–36.0)
MCV: 85.2 fl (ref 78.0–100.0)
Monocytes Absolute: 0.4 10*3/uL (ref 0.1–1.0)
Neutrophils Relative %: 42.1 % — ABNORMAL LOW (ref 43.0–77.0)
Platelets: 236 10*3/uL (ref 150.0–400.0)
WBC: 4.9 10*3/uL (ref 4.5–10.5)

## 2012-03-06 LAB — POCT URINALYSIS DIPSTICK
Bilirubin, UA: NEGATIVE
Nitrite, UA: NEGATIVE
Protein, UA: NEGATIVE
pH, UA: 6.5

## 2012-03-08 LAB — URINE CULTURE: Colony Count: 9000

## 2012-03-22 ENCOUNTER — Telehealth: Payer: Self-pay | Admitting: Internal Medicine

## 2012-03-22 DIAGNOSIS — R52 Pain, unspecified: Secondary | ICD-10-CM

## 2012-03-22 MED ORDER — FENTANYL 50 MCG/HR TD PT72: 1.0000 | MEDICATED_PATCH | TRANSDERMAL | Status: DC

## 2012-03-22 NOTE — Telephone Encounter (Signed)
Pt requesting refill on fentaNYL (DURAGESIC - DOSED MCG/HR) 50 MCG/HR. Pt requesting to pick up tues

## 2012-03-22 NOTE — Telephone Encounter (Signed)
rx up front ready for p/u, pt aware 

## 2012-04-10 ENCOUNTER — Ambulatory Visit (INDEPENDENT_AMBULATORY_CARE_PROVIDER_SITE_OTHER): Payer: Medicare Other | Admitting: Internal Medicine

## 2012-04-10 ENCOUNTER — Encounter: Payer: Self-pay | Admitting: Internal Medicine

## 2012-04-10 VITALS — BP 124/74 | Temp 97.8°F | Wt 131.0 lb

## 2012-04-10 DIAGNOSIS — N390 Urinary tract infection, site not specified: Secondary | ICD-10-CM

## 2012-04-10 DIAGNOSIS — R21 Rash and other nonspecific skin eruption: Secondary | ICD-10-CM

## 2012-04-10 DIAGNOSIS — R52 Pain, unspecified: Secondary | ICD-10-CM

## 2012-04-10 DIAGNOSIS — Z87440 Personal history of urinary (tract) infections: Secondary | ICD-10-CM

## 2012-04-10 LAB — POCT URINALYSIS DIPSTICK
Nitrite, UA: NEGATIVE
Protein, UA: NEGATIVE
Urobilinogen, UA: 0.2

## 2012-04-10 MED ORDER — MYCOPHENOLATE MOFETIL 500 MG PO TABS: 250.0000 mg | ORAL_TABLET | Freq: Every day | ORAL | Status: DC

## 2012-04-10 MED ORDER — FENTANYL 50 MCG/HR TD PT72: 1.0000 | MEDICATED_PATCH | TRANSDERMAL | Status: DC

## 2012-04-10 NOTE — Progress Notes (Signed)
Patient ID: [name], female   DOB: 1946-09-26, 66 y.o.   MRN: [id number]  Recurrent uti-- at least one episode of sepsis with VDRF. She is now on suppressive therapy with trimethoprim (allergy to other abx)  Rash--? Autoimmune---on cellcept  Has had previous trouble with orthostasis-- required florinef.   Past Medical History  Diagnosis Date  . B12 DEFICIENCY 05/03/2007  . FIBROMYALGIA 05/03/2007  . COLONIC POLYPS, HX OF 05/03/2007  . ANEMIA-NOS 09/25/2007  . HYPERLIPIDEMIA 12/19/2007  . HYPOTENSION, ORTHOSTATIC 12/06/2008  . OSTEOPOROSIS 11/10/2009  . MYOCARDIAL INFARCTION 01/25/2010  . 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 CARDIOMYOPATHY 02/08/2010  . LUPUS 02/08/2010  . CONGESTIVE HEART FAILURE, HX OF 02/08/2010  . GERD 02/22/2010    History   Social History  . Marital Status: Married    Spouse Name: N/A    Number of Children: N/A  . Years of Education: N/A   Occupational History  . Not on file.   Social History Main Topics  . Smoking status: Never Smoker   . Smokeless tobacco: Not on file  . Alcohol Use: No  . Drug Use:   . Sexually Active:    Other Topics Concern  . Not on file   Social History Narrative  . No narrative on file    Past Surgical History  Procedure Date  . Cholecystectomy   . Abdominal hysterectomy     oophorectomy  . Cystoscopy     Family History  Problem Relation Age of Onset  . Cancer Mother     esophageal  . Heart attack Father   . Diabetes Father   . Prostate cancer Father   . Ovarian cancer Sister   . Uterine cancer Sister   . Breast cancer Sister   . Colitis Sister   . Lung cancer Brother     Allergies  Allergen Reactions  . Imipramine Hcl     REACTION: rash  . Iohexol      Code: HIVES, Desc: PT developed 2 hives, followed by SOB, severe headache post 87cc's Omnipaque 300., Onset Date: [id number]   . Morphine Sulfate     REACTION: rash  . Sulfamethoxazole     REACTION: rash    Current Outpatient Prescriptions on  File Prior to Visit  Medication Sig Dispense Refill  . clonazePAM (KLONOPIN) 0.5 MG tablet Take 1 tablet (0.5 mg total) by mouth 2 (two) times daily as needed.  30 tablet  3  . cyanocobalamin 1000 MCG/ML injection Inject 1,000 mcg into the muscle every 3 (three) months.        . fentaNYL (DURAGESIC - DOSED MCG/HR) 50 MCG/HR Place 1 patch (50 mcg total) onto the skin every 3 (three) days.  10 patch  0  . fexofenadine (ALLEGRA) 60 MG tablet Take 60 mg by mouth daily.        . mycophenolate (CELLCEPT) 500 MG tablet TAKE 1 TABLET EVERY DAY  30 tablet  1  . Omeprazole 20 MG TBEC TAKE 1 TABLET BY MOUTH EVERY DAY  90 tablet  1  . polyethylene glycol (GLYCOLAX) packet Take 17 g by mouth as needed.        . senna (SENOKOT) 8.6 MG tablet Take 1 tablet by mouth daily.        Marland Kitchen triamcinolone cream (KENALOG) 0.5 % APPLY TOPICALLY 3 (THREE) TIMES DAILY.  30 g  1  . DISCONTD: trimethoprim (TRIMPEX) 100 MG tablet Take 1 tablet (100 mg total) by mouth daily.  90 tablet  3  . DISCONTD: trimethoprim (TRIMPEX) 100 MG tablet Take 1 tablet (100 mg total) by mouth 2 (two) times daily.  90 tablet  3     patient denies chest pain, shortness of breath, orthopnea. Denies lower extremity edema, abdominal pain, change in appetite, change in bowel movements. Patient denies rashes, musculoskeletal complaints. No other specific complaints in a complete review of systems.   BP 124/74  Temp(Src) 97.8 F (36.6 C) (Oral)  Wt 131 lb (59.421 kg)  Well-developed well-nourished female in no acute distress. HEENT exam atraumatic, normocephalic, extraocular muscles are intact. Neck is supple. No jugular venous distention no thyromegaly. Chest clear to auscultation without increased work of breathing. Cardiac exam S1 and S2 are regular. Abdominal exam active bowel sounds, soft, nontender.

## 2012-04-11 NOTE — Assessment & Plan Note (Signed)
Reviewed overview section Will decrease cellcept

## 2012-04-11 NOTE — Assessment & Plan Note (Signed)
Will check ua and culture 

## 2012-04-12 NOTE — Progress Notes (Signed)
Quick Note:  Pt informed ______ 

## 2012-04-17 ENCOUNTER — Other Ambulatory Visit: Payer: Self-pay | Admitting: Internal Medicine

## 2012-05-07 ENCOUNTER — Ambulatory Visit: Payer: BC Managed Care – PPO | Admitting: Internal Medicine

## 2012-05-13 ENCOUNTER — Telehealth: Payer: Self-pay | Admitting: Internal Medicine

## 2012-05-13 DIAGNOSIS — R52 Pain, unspecified: Secondary | ICD-10-CM

## 2012-05-13 MED ORDER — FENTANYL 50 MCG/HR TD PT72: 1.0000 | MEDICATED_PATCH | TRANSDERMAL | Status: DC

## 2012-05-13 NOTE — Telephone Encounter (Signed)
Ok per Dr Swords, rx up front ready for p/u, pt aware 

## 2012-05-13 NOTE — Telephone Encounter (Signed)
Pt called and is req to pick up fentaNYL (DURAGESIC - DOSED MCG/HR) 50 MCG/HR patch on Wednesday 05/15/12.

## 2012-05-23 ENCOUNTER — Other Ambulatory Visit: Payer: Self-pay | Admitting: Internal Medicine

## 2012-05-31 ENCOUNTER — Other Ambulatory Visit: Payer: Self-pay | Admitting: *Deleted

## 2012-05-31 DIAGNOSIS — F419 Anxiety disorder, unspecified: Secondary | ICD-10-CM

## 2012-05-31 MED ORDER — CLONAZEPAM 0.5 MG PO TABS: 0.5000 mg | ORAL_TABLET | Freq: Two times a day (BID) | ORAL | Status: DC | PRN

## 2012-06-14 ENCOUNTER — Telehealth: Payer: Self-pay | Admitting: Internal Medicine

## 2012-06-14 DIAGNOSIS — R52 Pain, unspecified: Secondary | ICD-10-CM

## 2012-06-14 MED ORDER — FENTANYL 50 MCG/HR TD PT72: 1.0000 | MEDICATED_PATCH | TRANSDERMAL | Status: DC

## 2012-06-14 NOTE — Telephone Encounter (Signed)
rx up front ready for p/u, pt aware 

## 2012-06-14 NOTE — Telephone Encounter (Signed)
Patient called stating that she need a refill of her duragesic patches 3mcg/hr. Please assist.

## 2012-06-17 ENCOUNTER — Other Ambulatory Visit: Payer: Self-pay | Admitting: Internal Medicine

## 2012-07-08 ENCOUNTER — Telehealth: Payer: Self-pay | Admitting: Family Medicine

## 2012-07-08 NOTE — Telephone Encounter (Signed)
Pt was offered an appt with another dr and she refused.  I don't know if you want to work her in somewhere?

## 2012-07-08 NOTE — Telephone Encounter (Signed)
[name], patient insisted on waiting for Dr. Cato Mulligan. Appt 8/19. She said if it gets worse, she'll call. I strongly encouraged her not to wait. Thanks.

## 2012-07-09 NOTE — Telephone Encounter (Signed)
Left message for pt to call back  °

## 2012-07-09 NOTE — Telephone Encounter (Signed)
appt scheduled for lab tomorrow, ok per Dr Cato Mulligan

## 2012-07-09 NOTE — Telephone Encounter (Signed)
Have her see Oran Rein if clinically indicated

## 2012-07-10 ENCOUNTER — Other Ambulatory Visit (INDEPENDENT_AMBULATORY_CARE_PROVIDER_SITE_OTHER): Payer: Medicare Other

## 2012-07-10 DIAGNOSIS — N390 Urinary tract infection, site not specified: Secondary | ICD-10-CM

## 2012-07-10 LAB — POCT URINALYSIS DIPSTICK
Bilirubin, UA: NEGATIVE
Ketones, UA: NEGATIVE
Protein, UA: NEGATIVE

## 2012-07-12 LAB — URINE CULTURE: Colony Count: NO GROWTH

## 2012-07-15 ENCOUNTER — Ambulatory Visit (INDEPENDENT_AMBULATORY_CARE_PROVIDER_SITE_OTHER): Payer: Medicare Other | Admitting: Internal Medicine

## 2012-07-15 ENCOUNTER — Encounter: Payer: Self-pay | Admitting: Internal Medicine

## 2012-07-15 VITALS — BP 118/74 | Temp 98.0°F | Wt 130.0 lb

## 2012-07-15 DIAGNOSIS — R259 Unspecified abnormal involuntary movements: Secondary | ICD-10-CM

## 2012-07-15 DIAGNOSIS — R251 Tremor, unspecified: Secondary | ICD-10-CM

## 2012-07-15 DIAGNOSIS — G20 Parkinson's disease: Secondary | ICD-10-CM | POA: Insufficient documentation

## 2012-07-15 DIAGNOSIS — R52 Pain, unspecified: Secondary | ICD-10-CM

## 2012-07-15 MED ORDER — FENTANYL 50 MCG/HR TD PT72: 1.0000 | MEDICATED_PATCH | TRANSDERMAL | Status: DC

## 2012-07-15 NOTE — Assessment & Plan Note (Signed)
She does have increased tone- i can't tell whether it's because she is not relaxing or has another problem. No treatment or further evaluation at this time

## 2012-07-15 NOTE — Progress Notes (Signed)
Patient ID: [name], female   DOB: [birth year], 66 y.o.   MRN: [id number]  Right hand shakes-she cannot tell me whether it happens at rest or with intention.  Stye developed this a.m. Right upper eye lid  Past Medical History  Diagnosis Date  . B12 DEFICIENCY 05/03/2007  . FIBROMYALGIA 05/03/2007  . COLONIC POLYPS, HX OF 05/03/2007  . ANEMIA-NOS 09/25/2007  . HYPERLIPIDEMIA 12/19/2007  . HYPOTENSION, ORTHOSTATIC 12/06/2008  . OSTEOPOROSIS 11/10/2009  . MYOCARDIAL INFARCTION 01/25/2010  . 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 CARDIOMYOPATHY 02/08/2010  . LUPUS 02/08/2010  . CONGESTIVE HEART FAILURE, HX OF 02/08/2010  . GERD 02/22/2010    History   Social History  . Marital Status: Married    Spouse Name: N/A    Number of Children: N/A  . Years of Education: N/A   Occupational History  . Not on file.   Social History Main Topics  . Smoking status: Never Smoker   . Smokeless tobacco: Not on file  . Alcohol Use: No  . Drug Use:   . Sexually Active:    Other Topics Concern  . Not on file   Social History Narrative  . No narrative on file    Past Surgical History  Procedure Date  . Cholecystectomy   . Abdominal hysterectomy     oophorectomy  . Cystoscopy     Family History  Problem Relation Age of Onset  . Cancer Mother     esophageal  . Heart attack Father   . Diabetes Father   . Prostate cancer Father   . Ovarian cancer Sister   . Uterine cancer Sister   . Breast cancer Sister   . Colitis Sister   . Lung cancer Brother     Allergies  Allergen Reactions  . Imipramine Hcl     REACTION: rash  . Iohexol      Code: HIVES, Desc: PT developed 2 hives, followed by SOB, severe headache post 87cc's Omnipaque 300., Onset Date: [id number]   . Morphine Sulfate     REACTION: rash  . Sulfamethoxazole     REACTION: rash    Current Outpatient Prescriptions on File Prior to Visit  Medication Sig Dispense Refill  . clonazePAM (KLONOPIN) 0.5 MG tablet Take 1 tablet  (0.5 mg total) by mouth 2 (two) times daily as needed.  30 tablet  3  . cyanocobalamin 1000 MCG/ML injection Inject 1,000 mcg into the muscle every 3 (three) months.        . fentaNYL (DURAGESIC - DOSED MCG/HR) 50 MCG/HR Place 1 patch (50 mcg total) onto the skin every 3 (three) days.  10 patch  0  . fexofenadine (ALLEGRA) 60 MG tablet Take 60 mg by mouth daily.        . Omeprazole 20 MG TBEC TAKE 1 TABLET BY MOUTH EVERY DAY  90 tablet  1  . polyethylene glycol (GLYCOLAX) packet Take 17 g by mouth as needed.        . senna (SENOKOT) 8.6 MG tablet Take 1 tablet by mouth daily.        Marland Kitchen triamcinolone cream (KENALOG) 0.5 % APPLY TOPICALLY 3 (THREE) TIMES DAILY.  30 g  1  . trimethoprim (TRIMPEX) 100 MG tablet Take 100 mg by mouth daily.      Marland Kitchen DISCONTD: mycophenolate (CELLCEPT) 500 MG tablet Take 0.5 tablets (250 mg total) by mouth daily.  30 tablet  1  . DISCONTD: mycophenolate (CELLCEPT) 500 MG tablet TAKE 1 TABLET EVERY  DAY  30 tablet  1  . DISCONTD: trimethoprim (TRIMPEX) 100 MG tablet Take 1 tablet (100 mg total) by mouth 2 (two) times daily.  90 tablet  3     patient denies chest pain, shortness of breath, orthopnea. Denies lower extremity edema, abdominal pain, change in appetite, change in bowel movements. Patient denies rashes, musculoskeletal complaints. No other specific complaints in a complete review of systems.   BP 118/74  Temp 98 F (36.7 C) (Oral)  Wt 130 lb (58.968 kg)  Well-developed well-nourished female in no acute distress. HEENT exam atraumatic, normocephalic, extraocular muscles are intact. Neck is supple. No jugular venous distention no thyromegaly. Chest clear to auscultation without increased work of breathing. Cardiac exam S1 and S2 are regular. Abdominal exam active bowel sounds, soft, nontender. Increased tone with cogwheeling of right wrist only- no other findings in RUE or LUE. Normal gait  Stye right upper eyelid  A/P- Stye-- warm compresses

## 2012-07-30 ENCOUNTER — Other Ambulatory Visit: Payer: Medicare Other

## 2012-07-30 DIAGNOSIS — N390 Urinary tract infection, site not specified: Secondary | ICD-10-CM

## 2012-08-03 ENCOUNTER — Other Ambulatory Visit: Payer: Self-pay | Admitting: Internal Medicine

## 2012-08-03 MED ORDER — CIPROFLOXACIN HCL 500 MG PO TABS: 500.0000 mg | ORAL_TABLET | Freq: Two times a day (BID) | ORAL | Status: AC

## 2012-08-08 ENCOUNTER — Telehealth: Payer: Self-pay | Admitting: Internal Medicine

## 2012-08-08 LAB — URINE CULTURE: Colony Count: >=100000

## 2012-08-08 NOTE — Telephone Encounter (Signed)
Caller: [name]/Patient; Patient Name: [name]; PCP: [name] (Adults only); Best Callback Phone Number: [phone]. Patient is calling concerning a  recent bladder infections.  She was seen after Labor day 07/29/12 for UTI.  She was placed on Ciprofloxin.  She started it on  Monday 08/05/12. Onset Tuesday 08/05/12  of stomach discomfort, and now has began vomiting with nausea. Emesis last night 08/07/12 . She has vomited x 2 with ongoing nausea.    She feels weak. +back pain, +urinary burning but it has very minimal improvement, low abdominal pain ongoing "uncomfortable" . No Fever.  Last UOP- this morning 30 minutes ago. She states she feels sick on her stomach and the bladder infection remains the same.  She is not experiencing any rash, no swelling of face, no changes in vision , no tingling of hands. She is questioning if this is medication side effect.  Emergent s/sx ruled out per Urinary Symptoms-Female Protocol with exception to "Evaluated by Provider adn symptoms worsening after following recommended treatment plan for 72 hours".  See in 24 hours.  I advised that I would send message to office to Dr. Cato Mulligan nurse regarding patient's symptoms and possible change in antibiotics or another evaluatiion needed.  Patient expressed understanding. Reviewed Side effect of Ciprofloxin with patient per Health Education.  Explained I would forward message to the office.  Patient pharmacy is   [facility] in [place]. She is aware to call back for questions, changes or worsening. PLEASE CONTACT PATIENT FOR MEDICATION CHANGE OR RE-EVALUATION.

## 2012-08-09 NOTE — Telephone Encounter (Signed)
Call and make sure she is better- The bacteria in her bladder should be sensitive to cipro

## 2012-08-12 MED ORDER — TRIMETHOPRIM 100 MG PO TABS: 100.0000 mg | ORAL_TABLET | Freq: Every day | ORAL | Status: DC

## 2012-08-12 NOTE — Telephone Encounter (Signed)
cipro is making pt vomit.  Wants to switch.  Dr Cato Mulligan is out of the office this week.  Could you change it?

## 2012-08-12 NOTE — Telephone Encounter (Signed)
Generic trimpex  100 mg #14 one twice a day

## 2012-08-12 NOTE — Telephone Encounter (Signed)
rx sent in electronically, pt aware 

## 2012-08-13 ENCOUNTER — Other Ambulatory Visit: Payer: Self-pay | Admitting: Internal Medicine

## 2012-08-19 ENCOUNTER — Other Ambulatory Visit: Payer: Self-pay | Admitting: Internal Medicine

## 2012-08-19 DIAGNOSIS — R52 Pain, unspecified: Secondary | ICD-10-CM

## 2012-08-19 NOTE — Telephone Encounter (Signed)
Pt need new rx fentanyl patches

## 2012-08-20 MED ORDER — FENTANYL 50 MCG/HR TD PT72: 1.0000 | MEDICATED_PATCH | TRANSDERMAL | Status: DC

## 2012-08-20 NOTE — Telephone Encounter (Signed)
Ok per Dr Swords, rx up front ready for p/u, pt aware 

## 2012-08-24 ENCOUNTER — Other Ambulatory Visit: Payer: Self-pay | Admitting: Internal Medicine

## 2012-09-02 ENCOUNTER — Other Ambulatory Visit: Payer: Self-pay | Admitting: Internal Medicine

## 2012-09-13 ENCOUNTER — Telehealth: Payer: Self-pay | Admitting: Internal Medicine

## 2012-09-13 DIAGNOSIS — R52 Pain, unspecified: Secondary | ICD-10-CM

## 2012-09-13 NOTE — Telephone Encounter (Signed)
Dr Cato Mulligan is out of the office.  Will address on Wednesday

## 2012-09-13 NOTE — Telephone Encounter (Signed)
Patient called stating that she need a refill of her Duragesic patches. Please assist.

## 2012-09-17 MED ORDER — FENTANYL 50 MCG/HR TD PT72: 1.0000 | MEDICATED_PATCH | TRANSDERMAL | Status: DC

## 2012-09-17 NOTE — Telephone Encounter (Signed)
Ok per Dr Swords, rx up front ready for p/u, pt aware 

## 2012-10-11 ENCOUNTER — Ambulatory Visit (INDEPENDENT_AMBULATORY_CARE_PROVIDER_SITE_OTHER): Payer: Medicare Other | Admitting: Internal Medicine

## 2012-10-11 ENCOUNTER — Encounter: Payer: Self-pay | Admitting: Internal Medicine

## 2012-10-11 VITALS — BP 134/72 | HR 72 | Temp 97.9°F | Ht 66.0 in | Wt 133.0 lb

## 2012-10-11 DIAGNOSIS — R52 Pain, unspecified: Secondary | ICD-10-CM

## 2012-10-11 DIAGNOSIS — R5383 Other fatigue: Secondary | ICD-10-CM

## 2012-10-11 DIAGNOSIS — R259 Unspecified abnormal involuntary movements: Secondary | ICD-10-CM

## 2012-10-11 DIAGNOSIS — R300 Dysuria: Secondary | ICD-10-CM

## 2012-10-11 DIAGNOSIS — G9050 Complex regional pain syndrome I, unspecified: Secondary | ICD-10-CM

## 2012-10-11 DIAGNOSIS — R251 Tremor, unspecified: Secondary | ICD-10-CM

## 2012-10-11 DIAGNOSIS — E538 Deficiency of other specified B group vitamins: Secondary | ICD-10-CM

## 2012-10-11 DIAGNOSIS — N390 Urinary tract infection, site not specified: Secondary | ICD-10-CM

## 2012-10-11 DIAGNOSIS — Z23 Encounter for immunization: Secondary | ICD-10-CM

## 2012-10-11 LAB — CBC WITH DIFFERENTIAL/PLATELET
Basophils Relative: 0.5 % (ref 0.0–3.0)
Eosinophils Absolute: 0.1 10*3/uL (ref 0.0–0.7)
Eosinophils Relative: 2.8 % (ref 0.0–5.0)
Hemoglobin: 11.5 g/dL — ABNORMAL LOW (ref 12.0–15.0)
Lymphocytes Relative: 39.3 % (ref 12.0–46.0)
MCHC: 32 g/dL (ref 30.0–36.0)
MCV: 84.7 fl (ref 78.0–100.0)
Neutro Abs: 2.2 10*3/uL (ref 1.4–7.7)
Neutrophils Relative %: 50 % (ref 43.0–77.0)
RBC: 4.25 Mil/uL (ref 3.87–5.11)
WBC: 4.4 10*3/uL — ABNORMAL LOW (ref 4.5–10.5)

## 2012-10-11 LAB — BASIC METABOLIC PANEL
BUN: 20 mg/dL (ref 6–23)
CO2: 29 mEq/L (ref 19–32)
Calcium: 9.8 mg/dL (ref 8.4–10.5)
Creatinine, Ser: 0.7 mg/dL (ref 0.4–1.2)
GFR: 86.6 mL/min (ref >60.00)
Glucose, Bld: 106 mg/dL — ABNORMAL HIGH (ref 70–99)

## 2012-10-11 LAB — POCT URINALYSIS DIPSTICK
Protein, UA: NEGATIVE
Spec Grav, UA: 1.02
Urobilinogen, UA: 0.2
pH, UA: 7

## 2012-10-11 LAB — HEPATIC FUNCTION PANEL
ALT: 10 U/L (ref 0–35)
AST: 17 U/L (ref 0–37)
Alkaline Phosphatase: 60 U/L (ref 39–117)
Total Bilirubin: 0.6 mg/dL (ref 0.3–1.2)

## 2012-10-11 LAB — TSH: TSH: 1.36 u[IU]/mL (ref 0.35–5.50)

## 2012-10-11 MED ORDER — CYANOCOBALAMIN 1000 MCG/ML IJ SOLN
1000.0000 ug | Freq: Once | INTRAMUSCULAR | Status: AC
  Administered 2012-10-11: 1000 ug via INTRAMUSCULAR

## 2012-10-11 MED ORDER — FENTANYL 50 MCG/HR TD PT72: 1.0000 | MEDICATED_PATCH | TRANSDERMAL | Status: DC

## 2012-10-11 MED ORDER — CEPHALEXIN 500 MG PO CAPS: 500.0000 mg | ORAL_CAPSULE | Freq: Three times a day (TID) | ORAL | Status: DC

## 2012-10-13 NOTE — Progress Notes (Signed)
Patient ID: [name], female   DOB: June 21, 1946, 66 y.o.   MRN: [id number] Recurrent uti with hx of sepsis secondary to uti-- she is on chronic suppressive therapy. Has complaints of dysuria currently.  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 Denies lower extremity edema, abdominal pain, change in appetite, change in bowel movements. Patient denies rashes, musculoskeletal complaints. No other specific complaints in a complete review of systems.    Well-developed well-nourished female in no acute distress. HEENT exam atraumatic, normocephalic, extraocular muscles are intact. Neck is supple. No jugular venous distention no thyromegaly. Chest clear to auscultation without increased work of breathing. Cardiac exam S1 and S2 are regular. Abdominal exam active bowel sounds, soft, nontender. Extremities no edema. Neurologic exam she is alert without any motor sensory deficits. Gait is normal. DERM: no significant active rash

## 2012-10-13 NOTE — Assessment & Plan Note (Signed)
Right hand still with resting tremor-- does not interfere with ADLs\ Will not evaluate at this time

## 2012-10-13 NOTE — Assessment & Plan Note (Signed)
Chronic problem- Continue pain meds

## 2012-10-13 NOTE — Assessment & Plan Note (Signed)
Check labs today With culture

## 2012-10-15 ENCOUNTER — Other Ambulatory Visit: Payer: Self-pay | Admitting: *Deleted

## 2012-10-15 DIAGNOSIS — F419 Anxiety disorder, unspecified: Secondary | ICD-10-CM

## 2012-10-15 MED ORDER — CLONAZEPAM 0.5 MG PO TABS: 0.5000 mg | ORAL_TABLET | Freq: Two times a day (BID) | ORAL | Status: DC | PRN

## 2012-10-15 MED ORDER — LEVOFLOXACIN 500 MG PO TABS: 500.0000 mg | ORAL_TABLET | Freq: Every day | ORAL | Status: DC

## 2012-11-13 ENCOUNTER — Telehealth: Payer: Self-pay | Admitting: Internal Medicine

## 2012-11-13 DIAGNOSIS — R52 Pain, unspecified: Secondary | ICD-10-CM

## 2012-11-13 MED ORDER — FENTANYL 50 MCG/HR TD PT72: 1.0000 | MEDICATED_PATCH | TRANSDERMAL | Status: DC

## 2012-11-13 NOTE — Telephone Encounter (Signed)
Ok per Dr Swords, rx up front for p/u, pt aware 

## 2012-11-13 NOTE — Telephone Encounter (Signed)
Patient called stating that she need a refill of her duragesic patch. Please assist as patient will be in [place] today.

## 2012-12-09 ENCOUNTER — Telehealth: Payer: Self-pay | Admitting: Internal Medicine

## 2012-12-09 NOTE — Telephone Encounter (Signed)
Ok to put on lab schedule

## 2012-12-09 NOTE — Telephone Encounter (Signed)
Pt aware/kjh 

## 2012-12-09 NOTE — Telephone Encounter (Signed)
Pt has another bladder inf she thinks. MD told her she could come in and get a UA to see about this if she had more symptoms again.  Can pt come in and get a UA? She then would need a script if she has inf.

## 2012-12-10 ENCOUNTER — Telehealth: Payer: Self-pay | Admitting: Internal Medicine

## 2012-12-10 ENCOUNTER — Other Ambulatory Visit (INDEPENDENT_AMBULATORY_CARE_PROVIDER_SITE_OTHER): Payer: PRIVATE HEALTH INSURANCE

## 2012-12-10 DIAGNOSIS — N390 Urinary tract infection, site not specified: Secondary | ICD-10-CM

## 2012-12-10 LAB — POCT URINALYSIS DIPSTICK
Glucose, UA: NEGATIVE
Ketones, UA: NEGATIVE
Protein, UA: NEGATIVE

## 2012-12-10 NOTE — Telephone Encounter (Signed)
Per Dr Cato Mulligan, culture urine.  Instructed pt to get AZO for the pain until culture comes back.  She wanted to take IBF for muscle aches but she is also complaining of kidney pain so I instructed pt to stay away NSAIDS and take tylenol

## 2012-12-10 NOTE — Telephone Encounter (Signed)
Pt was hoping to hear from you today and get a script for UTI.

## 2012-12-12 ENCOUNTER — Other Ambulatory Visit: Payer: Self-pay | Admitting: *Deleted

## 2012-12-12 MED ORDER — CEPHALEXIN 500 MG PO CAPS: 500.0000 mg | ORAL_CAPSULE | Freq: Three times a day (TID) | ORAL | Status: DC

## 2012-12-12 NOTE — Progress Notes (Signed)
  Subjective:    Patient ID: [name], female    DOB: August 17, 1946, 67 y.o.   MRN: [id number]  HPI    Review of Systems     Objective:   Physical Exam        Assessment & Plan:  Call patient. She has a UTI. We will call in an antibiotic.

## 2012-12-13 ENCOUNTER — Other Ambulatory Visit: Payer: Self-pay | Admitting: Internal Medicine

## 2012-12-13 DIAGNOSIS — R52 Pain, unspecified: Secondary | ICD-10-CM

## 2012-12-13 LAB — URINE CULTURE: Colony Count: >=100000

## 2012-12-13 MED ORDER — FENTANYL 50 MCG/HR TD PT72: 1.0000 | MEDICATED_PATCH | TRANSDERMAL | Status: DC

## 2012-12-13 NOTE — Telephone Encounter (Signed)
Pt needs new rx fentanyl patches °

## 2012-12-13 NOTE — Telephone Encounter (Signed)
rx will be ready on Monday, pt aware

## 2013-01-13 ENCOUNTER — Telehealth: Payer: Self-pay | Admitting: Internal Medicine

## 2013-01-13 DIAGNOSIS — R52 Pain, unspecified: Secondary | ICD-10-CM

## 2013-01-13 MED ORDER — FENTANYL 50 MCG/HR TD PT72: 1.0000 | MEDICATED_PATCH | TRANSDERMAL | Status: DC

## 2013-01-13 NOTE — Telephone Encounter (Signed)
rx will be ready for p/u tomorrow.  Pt aware

## 2013-01-13 NOTE — Telephone Encounter (Signed)
Patient called stating that she need a refill of her duragesic patches. Please assist.

## 2013-02-18 ENCOUNTER — Ambulatory Visit (INDEPENDENT_AMBULATORY_CARE_PROVIDER_SITE_OTHER): Payer: Medicare Other | Admitting: Family Medicine

## 2013-02-18 ENCOUNTER — Encounter: Payer: Self-pay | Admitting: Family Medicine

## 2013-02-18 VITALS — BP 118/78 | HR 72 | Temp 98.2°F | Ht 65.75 in | Wt 135.0 lb

## 2013-02-18 DIAGNOSIS — G9050 Complex regional pain syndrome I, unspecified: Secondary | ICD-10-CM

## 2013-02-18 DIAGNOSIS — I2510 Atherosclerotic heart disease of native coronary artery without angina pectoris: Secondary | ICD-10-CM

## 2013-02-18 DIAGNOSIS — M810 Age-related osteoporosis without current pathological fracture: Secondary | ICD-10-CM

## 2013-02-18 DIAGNOSIS — E538 Deficiency of other specified B group vitamins: Secondary | ICD-10-CM

## 2013-02-18 DIAGNOSIS — IMO0001 Reserved for inherently not codable concepts without codable children: Secondary | ICD-10-CM

## 2013-02-18 DIAGNOSIS — N390 Urinary tract infection, site not specified: Secondary | ICD-10-CM

## 2013-02-18 DIAGNOSIS — R52 Pain, unspecified: Secondary | ICD-10-CM

## 2013-02-18 MED ORDER — FENTANYL 50 MCG/HR TD PT72: 1.0000 | MEDICATED_PATCH | TRANSDERMAL | Status: DC

## 2013-02-18 MED ORDER — CYANOCOBALAMIN 1000 MCG/ML IJ SOLN
1000.0000 ug | Freq: Once | INTRAMUSCULAR | Status: AC
  Administered 2013-02-18: 1000 ug via INTRAMUSCULAR

## 2013-02-18 NOTE — Assessment & Plan Note (Signed)
With h/o chronic cystitis, currently treating with estrogen cream and ppx abx. Followed by Dr. [facility]

## 2013-02-18 NOTE — Assessment & Plan Note (Signed)
MI in 2011, due to sepsis.

## 2013-02-18 NOTE — Progress Notes (Signed)
  Subjective:    Patient ID: [name], female    DOB: 1946/06/03, 67 y.o.   MRN: [id number]  HPI CC: transfer of care from Dr. Cato Mulligan  This office is closer to home.  Chronic cystitis with recurrent UTIs - states has had monthly infection for the last year.  Sees Dr. Vernie Ammons for this.  Treating currently with estrogen cream 3x/wk.  H/o urosepsis, last hospitalization 2008.  On ppx abx.  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  Takes 500mg  daily.  When has tried to wean off, rash returns.  Saw dermatologists at [facility].    ?h/o lupus vs mixed connective tissue disease.  Having pain in back and left side.  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  Takes ibuprofen for pain as well as duragesic patch (50mg ).  Requests refill of this.  Osteoporosis - last bone density scan was 08/2009 -2.7 L femur.  Due for repeat.  Not on meds for this.  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 Has had medicare since became disabled (1990s) Due for mammogram and bone density scan. Colonoscopy - h/o polyps but latest WNL 2009, rec rpt 10 yrs Juanda Chance)  Past Medical History  Diagnosis Date  . B12 DEFICIENCY 05/03/2007  . FIBROMYALGIA 05/03/2007  . COLONIC POLYPS, HX OF 05/03/2007  . ANEMIA-NOS 09/25/2007  . HYPERLIPIDEMIA 12/19/2007  . HYPOTENSION, ORTHOSTATIC 12/06/2008  . OSTEOPOROSIS 11/10/2009  . MYOCARDIAL INFARCTION 01/25/2010  . 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 CARDIOMYOPATHY 02/08/2010  . LUPUS 02/08/2010  . CONGESTIVE HEART FAILURE, HX OF 02/08/2010  . GERD 02/22/2010    Review of Systems  Constitutional: Negative for fever and chills.   Per HPI    Objective:   Physical Exam  Nursing note and vitals reviewed. Constitutional: She appears well-developed and well-nourished. No distress.  HENT:  Mouth/Throat:  Oropharynx is clear and moist. No oropharyngeal exudate.  Eyes: Conjunctivae and EOM are normal. Pupils are equal, round, and reactive to light. No scleral icterus.  Neck: Normal range of motion. Neck supple. Carotid bruit is not present.  Cardiovascular: Normal rate, regular rhythm, normal heart sounds and intact distal pulses.   No murmur heard. Pulmonary/Chest: Effort normal and breath sounds normal. No respiratory distress. She has no wheezes. She has no rales.  Musculoskeletal: She exhibits no edema.  Skin: Skin is warm and dry. No rash noted.  Psychiatric: She has a normal mood and affect.       Assessment & Plan:

## 2013-02-18 NOTE — Assessment & Plan Note (Signed)
Consider rpt dexa as latest was 2010. Intolerant of bisphosphonates in past 2/2 dysphagia.

## 2013-02-18 NOTE — Patient Instructions (Addendum)
B12 shot today. Fentanyl patch refilled. Return in next few months fasting for blood work and afterwards for [employer] visit. Good to see you today, call us with questions.

## 2013-02-18 NOTE — Assessment & Plan Note (Signed)
Refilled fentanyl patch

## 2013-02-18 NOTE — Assessment & Plan Note (Signed)
B12 shot today. 

## 2013-02-18 NOTE — Assessment & Plan Note (Signed)
Chronic, on disability for this.

## 2013-02-25 DIAGNOSIS — H409 Unspecified glaucoma: Secondary | ICD-10-CM | POA: Insufficient documentation

## 2013-02-25 HISTORY — DX: Unspecified glaucoma: H40.9

## 2013-02-28 ENCOUNTER — Other Ambulatory Visit: Payer: Self-pay

## 2013-02-28 MED ORDER — MYCOPHENOLATE MOFETIL 500 MG PO TABS: ORAL_TABLET | ORAL | Status: DC

## 2013-02-28 NOTE — Telephone Encounter (Signed)
Pt left v/m requesting refill mycophenolate to [facility].Please advise.

## 2013-02-28 NOTE — Telephone Encounter (Signed)
plz notify sent in. 

## 2013-03-06 ENCOUNTER — Telehealth: Payer: Self-pay

## 2013-03-06 NOTE — Telephone Encounter (Signed)
Prior auth required for Mycophenolate; form in Dr [employer] in box.

## 2013-03-07 NOTE — Telephone Encounter (Signed)
Filled and placed in my out box. [name] - can we change PCPs in chart and is there any way of requesting the paper chart transferred here from [place]?

## 2013-03-10 NOTE — Telephone Encounter (Signed)
Approval letter received; spoke with laurie at [facility] rx went thru and pt notified. Letter in Dr Timoteo Expose in box for signature and scanning.

## 2013-03-19 ENCOUNTER — Other Ambulatory Visit: Payer: Self-pay

## 2013-03-19 DIAGNOSIS — R52 Pain, unspecified: Secondary | ICD-10-CM

## 2013-03-19 MED ORDER — FENTANYL 50 MCG/HR TD PT72: 1.0000 | MEDICATED_PATCH | TRANSDERMAL | Status: DC

## 2013-03-19 NOTE — Telephone Encounter (Signed)
Pt left v/m requesting rx Fentanyl. Call when ready for pick up.

## 2013-03-19 NOTE — Telephone Encounter (Signed)
Printed and placed in [name]'s box. 

## 2013-03-20 NOTE — Telephone Encounter (Signed)
Patient notified. Rx placed up front for pick up. 

## 2013-04-03 ENCOUNTER — Other Ambulatory Visit: Payer: Self-pay | Admitting: Internal Medicine

## 2013-04-04 ENCOUNTER — Telehealth: Payer: Self-pay

## 2013-04-04 NOTE — Telephone Encounter (Signed)
Opened in error

## 2013-04-10 ENCOUNTER — Other Ambulatory Visit: Payer: Self-pay | Admitting: Internal Medicine

## 2013-04-22 ENCOUNTER — Other Ambulatory Visit: Payer: Self-pay

## 2013-04-22 DIAGNOSIS — R52 Pain, unspecified: Secondary | ICD-10-CM

## 2013-04-22 MED ORDER — FENTANYL 50 MCG/HR TD PT72: 1.0000 | MEDICATED_PATCH | TRANSDERMAL | Status: DC

## 2013-04-22 NOTE — Telephone Encounter (Signed)
Pt left v/m requesting fentanyl patch. Call when ready for pick up.

## 2013-04-22 NOTE — Telephone Encounter (Signed)
Printed and placed in [name]'s box. 

## 2013-04-22 NOTE — Telephone Encounter (Signed)
Message left notifying patient and Rx placed up front for pick up. 

## 2013-04-27 HISTORY — PX: OTHER SURGICAL HISTORY: SHX169

## 2013-05-09 ENCOUNTER — Other Ambulatory Visit: Payer: Self-pay | Admitting: Family Medicine

## 2013-05-09 DIAGNOSIS — E559 Vitamin D deficiency, unspecified: Secondary | ICD-10-CM | POA: Insufficient documentation

## 2013-05-09 DIAGNOSIS — E538 Deficiency of other specified B group vitamins: Secondary | ICD-10-CM

## 2013-05-09 DIAGNOSIS — R739 Hyperglycemia, unspecified: Secondary | ICD-10-CM

## 2013-05-09 DIAGNOSIS — E785 Hyperlipidemia, unspecified: Secondary | ICD-10-CM

## 2013-05-09 DIAGNOSIS — Z5181 Encounter for therapeutic drug level monitoring: Secondary | ICD-10-CM

## 2013-05-09 HISTORY — DX: Vitamin D deficiency, unspecified: E55.9

## 2013-05-13 ENCOUNTER — Other Ambulatory Visit (INDEPENDENT_AMBULATORY_CARE_PROVIDER_SITE_OTHER): Payer: Medicare Other

## 2013-05-13 DIAGNOSIS — E559 Vitamin D deficiency, unspecified: Secondary | ICD-10-CM

## 2013-05-13 DIAGNOSIS — R739 Hyperglycemia, unspecified: Secondary | ICD-10-CM

## 2013-05-13 DIAGNOSIS — I951 Orthostatic hypotension: Secondary | ICD-10-CM

## 2013-05-13 DIAGNOSIS — Z5181 Encounter for therapeutic drug level monitoring: Secondary | ICD-10-CM

## 2013-05-13 DIAGNOSIS — E785 Hyperlipidemia, unspecified: Secondary | ICD-10-CM

## 2013-05-13 DIAGNOSIS — D649 Anemia, unspecified: Secondary | ICD-10-CM

## 2013-05-13 DIAGNOSIS — R7309 Other abnormal glucose: Secondary | ICD-10-CM

## 2013-05-13 LAB — CBC WITH DIFFERENTIAL/PLATELET
Basophils Absolute: 0 10*3/uL (ref 0.0–0.1)
Basophils Relative: 0.7 % (ref 0.0–3.0)
Eosinophils Relative: 4.1 % (ref 0.0–5.0)
HCT: 36.2 % (ref 36.0–46.0)
Hemoglobin: 12 g/dL (ref 12.0–15.0)
Lymphocytes Relative: 49.7 % — ABNORMAL HIGH (ref 12.0–46.0)
Lymphs Abs: 2.2 10*3/uL (ref 0.7–4.0)
Monocytes Relative: 7.9 % (ref 3.0–12.0)
Neutro Abs: 1.7 10*3/uL (ref 1.4–7.7)
RBC: 4.2 Mil/uL (ref 3.87–5.11)
RDW: 13.7 % (ref 11.5–14.6)

## 2013-05-13 LAB — COMPREHENSIVE METABOLIC PANEL
BUN: 18 mg/dL (ref 6–23)
CO2: 29 mEq/L (ref 19–32)
Calcium: 10.3 mg/dL (ref 8.4–10.5)
Chloride: 105 mEq/L (ref 96–112)
Creatinine, Ser: 0.7 mg/dL (ref 0.4–1.2)
GFR: 83.75 mL/min (ref >60.00)
Total Bilirubin: 0.6 mg/dL (ref 0.3–1.2)

## 2013-05-13 LAB — LIPID PANEL
HDL: 65.6 mg/dL (ref >39.00)
Triglycerides: 96 mg/dL (ref 0.0–149.0)

## 2013-05-14 LAB — VITAMIN D 25 HYDROXY (VIT D DEFICIENCY, FRACTURES): Vit D, 25-Hydroxy: 40 ng/mL (ref 30–89)

## 2013-05-19 ENCOUNTER — Encounter: Payer: Self-pay | Admitting: Radiology

## 2013-05-20 ENCOUNTER — Ambulatory Visit (INDEPENDENT_AMBULATORY_CARE_PROVIDER_SITE_OTHER): Payer: Medicare Other | Admitting: Family Medicine

## 2013-05-20 ENCOUNTER — Encounter: Payer: Self-pay | Admitting: Family Medicine

## 2013-05-20 VITALS — BP 118/74 | HR 60 | Temp 98.1°F | Ht 65.0 in | Wt 125.5 lb

## 2013-05-20 DIAGNOSIS — E559 Vitamin D deficiency, unspecified: Secondary | ICD-10-CM

## 2013-05-20 DIAGNOSIS — Z1231 Encounter for screening mammogram for malignant neoplasm of breast: Secondary | ICD-10-CM

## 2013-05-20 DIAGNOSIS — Z0000 Encounter for general adult medical examination without abnormal findings: Secondary | ICD-10-CM | POA: Insufficient documentation

## 2013-05-20 DIAGNOSIS — E785 Hyperlipidemia, unspecified: Secondary | ICD-10-CM

## 2013-05-20 DIAGNOSIS — D649 Anemia, unspecified: Secondary | ICD-10-CM

## 2013-05-20 DIAGNOSIS — R109 Unspecified abdominal pain: Secondary | ICD-10-CM

## 2013-05-20 DIAGNOSIS — M810 Age-related osteoporosis without current pathological fracture: Secondary | ICD-10-CM

## 2013-05-20 DIAGNOSIS — N390 Urinary tract infection, site not specified: Secondary | ICD-10-CM

## 2013-05-20 DIAGNOSIS — E538 Deficiency of other specified B group vitamins: Secondary | ICD-10-CM

## 2013-05-20 DIAGNOSIS — IMO0001 Reserved for inherently not codable concepts without codable children: Secondary | ICD-10-CM

## 2013-05-20 DIAGNOSIS — R52 Pain, unspecified: Secondary | ICD-10-CM

## 2013-05-20 HISTORY — DX: Hyperlipidemia, unspecified: E78.5

## 2013-05-20 HISTORY — DX: Encounter for general adult medical examination without abnormal findings: Z00.00

## 2013-05-20 LAB — POCT URINALYSIS DIPSTICK
Bilirubin, UA: NEGATIVE
Glucose, UA: NEGATIVE
Ketones, UA: NEGATIVE
Spec Grav, UA: 1.01
pH, UA: 7

## 2013-05-20 MED ORDER — FENTANYL 50 MCG/HR TD PT72: 1.0000 | MEDICATED_PATCH | TRANSDERMAL | Status: DC

## 2013-05-20 MED ORDER — CEPHALEXIN 500 MG PO CAPS: 500.0000 mg | ORAL_CAPSULE | Freq: Two times a day (BID) | ORAL | Status: DC

## 2013-05-20 MED ORDER — CYANOCOBALAMIN 1000 MCG/ML IJ SOLN
1000.0000 ug | Freq: Once | INTRAMUSCULAR | Status: AC
  Administered 2013-05-20: 1000 ug via INTRAMUSCULAR

## 2013-05-20 NOTE — Assessment & Plan Note (Signed)
B12 shot today.  Gets Q3 mo Lab Results  Component Value Date   VITAMINB12 327 02/11/2010  due for recheck.

## 2013-05-20 NOTE — Assessment & Plan Note (Signed)
With h/o RSD. On fentanyl patch for this.

## 2013-05-20 NOTE — Assessment & Plan Note (Signed)
Check DEXA briefly discussed IV bisphosphonate - as intolerant of oral bisphosphonates 2/2 dysphagia. Vit D and Ca stable.  Cr normal. Lab Results  Component Value Date   CREATININE 0.7 05/13/2013

## 2013-05-20 NOTE — Assessment & Plan Note (Signed)
I have personally reviewed the Medicare Annual Wellness questionnaire and have noted 1. The patient's medical and social history 2. Their use of alcohol, tobacco or illicit drugs 3. Their current medications and supplements 4. The patient's functional ability including ADL's, fall risks, home safety risks and hearing or visual impairment. 5. Diet and physical activity 6. Evidence for depression or mood disorders The patients weight, height, BMI have been recorded in the chart.  Hearing and vision has been addressed. I have made referrals, counseling and provided education to the patient based review of the above and I have provided the pt with a written personalized care plan for preventive services. See scanned questionairre. Advanced directives discussed: pt has packet at home - will review with husband.  Reviewed preventative protocols and updated unless pt declined. UTD immunizations. Refer for dexa and mammogram.

## 2013-05-20 NOTE — Patient Instructions (Addendum)
B12 shot today. Pass by Marion's office to schedule referral for mammogram and bone density scan. Urine checked today - we will call you with results.

## 2013-05-20 NOTE — Assessment & Plan Note (Addendum)
Stable off meds.  H/o CAD/MI. 10lb weight loss noted. Wt Readings from Last 3 Encounters:  05/20/13 125 lb 8 oz (56.926 kg)  02/18/13 135 lb (61.236 kg)  10/11/12 133 lb (60.328 kg)

## 2013-05-20 NOTE — Assessment & Plan Note (Signed)
In h/o chronic interstitial cystitis. Given today's chronic abd pain and back pain and chronic LUTS - checked UA - concerning for rpt infection.  Sent UCx and will start keflex bid 7d course.

## 2013-05-20 NOTE — Progress Notes (Signed)
Subjective:    Patient ID: [name], female    DOB: [birth year], 67 y.o.   MRN: [id number]  HPI CC: medicare wellness visit  FM - having significant pain recently.  Takes fentanyl 1 patch every 3 days.  Back pain - bilateral flank that travels up to shoulders.  Chronically with some dysuria, urgency and frequency.  Wonders if back pain stemming from bladder.  H/o chronic cystitis.  Osteoporosis - intolerant of bisphosphonate 2/2 GI intolerance  Passes hearing screen today. Vision exam - recently done at [facility] (02/2013).  Pending w/u for glaucoma. Denies depression, anhedonia, sadness.  Denies falls in last year.  Lives with husband [name]) and daughter, 1 dog Disability - fibromylagia, lupus, chronic R arm and leg pain (RSD) Occupation: worked at [employer] and [employer] - Production designer, theatre/television/film Activity: limited by back pain  Preventative:  Has had medicare since became disabled (1990s)  Colonoscopy - h/o polyps but latest WNL 2009, rec rpt 10 yrs [name]) Due for mammogram and bone density scan.  Pelvic - s/p hysterectomy with oophorectomy Flu - 2013 Tdap 2012 Zostavax 2011 Advanced directives: sent home with packet - looking into this with husband  Medications and allergies reviewed and updated in chart.  Past histories reviewed and updated if relevant as below. Patient Active Problem List   Diagnosis Date Noted  . Medicare annual wellness visit, initial 05/20/2013  . Vitamin D deficiency 05/09/2013  . Tremor 07/15/2012  . Recurrent UTI 01/03/2011  . Syncope 01/03/2011  . RASH-NONVESICULAR 05/11/2010  . GERD 02/22/2010  . 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 CARDIOMYOPATHY 02/08/2010  . CAD (coronary artery disease) 01/25/2010  . OSTEOPOROSIS 11/10/2009  . HYPOTENSION, ORTHOSTATIC 12/06/2008  . HYPERLIPIDEMIA 12/19/2007  . ANEMIA-NOS 09/25/2007  . B12 DEFICIENCY 05/03/2007  . FIBROMYALGIA 05/03/2007   Past Medical History  Diagnosis Date  . B12  DEFICIENCY 05/03/2007  . FIBROMYALGIA 05/03/2007  . ANEMIA-NOS 09/25/2007  . HYPERLIPIDEMIA 12/19/2007  . HYPOTENSION, ORTHOSTATIC 12/06/2008  . OSTEOPOROSIS 08/2009    bisphosphonate on hold 2/2 dysphagia  . 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 LUPUS 02/08/2010  . GERD 02/22/2010  . Chronic cystitis     Ottelin  . CAD (coronary artery disease) 01/2010    MI, [name]  . History of CHF (congestive heart failure) 01/2010  . Cardiomyopathy 01/2010  . History of colon polyps 2004  . Takotsubo cardiomyopathy 2008    due to E coli urosepsis  . Glaucoma 02/2013   Past Surgical History  Procedure Laterality Date  . Cholecystectomy      complication - low blood pressure  . Abdominal hysterectomy  1970s    first partial then with oophorectomy, complication - low blood pressure  . Cystoscopy    . Colonoscopy  06/2008    h/o polyps but latest WNL, rec rpt 10 yrs [name])   History  Substance Use Topics  . Smoking status: Never Smoker   . Smokeless tobacco: Never Used  . Alcohol Use: No   Family History  Problem Relation Age of Onset  . Cancer Mother     esophageal  . Heart attack Father   . Diabetes Father   . Prostate cancer Father   . Ovarian cancer Sister   . Uterine cancer Sister   . Breast cancer Sister   . Colitis Sister   . Lung cancer Brother   . CAD Brother    Allergies  Allergen Reactions  . Ciprofloxacin Nausea  And Vomiting  . Imipramine Hcl     REACTION: rash  . Iohexol      Code: HIVES, Desc: PT developed 2 hives, followed by SOB, severe headache post 87cc's Omnipaque 300., Onset Date: [id number]   . Morphine Sulfate     REACTION: rash  . Sulfamethoxazole     REACTION: rash  . Lidocaine Hcl Rash  . Tetracyclines & Related Rash   Current Outpatient Prescriptions on File Prior to Visit  Medication Sig Dispense Refill  . clonazePAM (KLONOPIN) 0.5 MG tablet TAKE 1 TABLET TWICE A DAY AS NEEDED  60 tablet  3  . cyanocobalamin 1000 MCG/ML  injection Inject 1,000 mcg into the muscle every 3 (three) months.        . mycophenolate (CELLCEPT) 500 MG tablet TAKE 1 TABLET EVERY DAY  30 tablet  6  . omeprazole (PRILOSEC) 20 MG capsule TAKE 1 TABLET BY MOUTH EVERY DAY  90 capsule  0  . polyethylene glycol (GLYCOLAX) packet Take 17 g by mouth as needed.        . senna (SENOKOT) 8.6 MG tablet Take 1 tablet by mouth daily.        Marland Kitchen triamcinolone cream (KENALOG) 0.5 % APPLY TOPICALLY 3 (THREE) TIMES DAILY.  30 g  1   No current facility-administered medications on file prior to visit.      Review of Systems  Constitutional: Negative for fever, chills, activity change, appetite change, fatigue and unexpected weight change.  HENT: Negative for hearing loss and neck pain.   Eyes: Negative for visual disturbance.  Respiratory: Negative for cough, chest tightness, shortness of breath and wheezing.   Cardiovascular: Negative for chest pain, palpitations and leg swelling.  Gastrointestinal: Positive for abdominal pain (B abd pain that radiates to flank) and constipation. Negative for nausea, vomiting, diarrhea, blood in stool and abdominal distention.  Genitourinary: Negative for hematuria and difficulty urinating.  Musculoskeletal: Negative for myalgias and arthralgias.  Skin: Negative for rash.  Neurological: Negative for dizziness, seizures, syncope and headaches.  Hematological: Negative for adenopathy. Does not bruise/bleed easily.  Psychiatric/Behavioral: Negative for dysphoric mood. The patient is not nervous/anxious.        Objective:   Physical Exam  Nursing note and vitals reviewed. Constitutional: She is oriented to person, place, and time. She appears well-developed and well-nourished. No distress.  HENT:  Head: Normocephalic and atraumatic.  Nose: Nose normal.  Mouth/Throat: Oropharynx is clear and moist. No oropharyngeal exudate.  Eyes: Conjunctivae and EOM are normal. Pupils are equal, round, and reactive to light. No  scleral icterus.  Neck: Normal range of motion. Neck supple. No thyromegaly present.  Cardiovascular: Normal rate, regular rhythm, normal heart sounds and intact distal pulses.   No murmur heard. Pulses:      Radial pulses are 2+ on the right side, and 2+ on the left side.  Pulmonary/Chest: Effort normal and breath sounds normal. No respiratory distress. She has no wheezes. She has no rales. Right breast exhibits no inverted nipple, no mass, no nipple discharge, no skin change and no tenderness. Left breast exhibits no inverted nipple, no mass, no nipple discharge, no skin change and no tenderness. Breasts are symmetrical.  Abdominal: Soft. Normal appearance and bowel sounds are normal. She exhibits no distension and no mass. There is no hepatosplenomegaly. There is tenderness (moderate) in the epigastric area, suprapubic area and left lower quadrant. There is CVA tenderness. There is no rigidity, no rebound, no guarding and negative Murphy's sign.  Musculoskeletal:  Normal range of motion. She exhibits no edema.  Diffuse back pain midline as well as paraspinous mm tenderness  Lymphadenopathy:    She has no cervical adenopathy.    She has no axillary adenopathy.       Right axillary: No lateral adenopathy present.       Left axillary: No lateral adenopathy present.      Right: No supraclavicular adenopathy present.       Left: No supraclavicular adenopathy present.  Neurological: She is alert and oriented to person, place, and time.  CN grossly intact, station and gait intact R hand tremor noted  Skin: Skin is warm and dry. No rash noted.  Psychiatric: She has a normal mood and affect. Her behavior is normal. Judgment and thought content normal.       Assessment & Plan:

## 2013-05-22 LAB — URINE CULTURE: Colony Count: >=100000

## 2013-05-26 ENCOUNTER — Ambulatory Visit (INDEPENDENT_AMBULATORY_CARE_PROVIDER_SITE_OTHER)
Admission: RE | Admit: 2013-05-26 | Discharge: 2013-05-26 | Disposition: A | Discharge status: Home / Self Care | Payer: Medicare Other | Source: Ambulatory Visit | Attending: Family Medicine | Admitting: Family Medicine

## 2013-05-26 DIAGNOSIS — M810 Age-related osteoporosis without current pathological fracture: Secondary | ICD-10-CM

## 2013-05-28 ENCOUNTER — Encounter: Payer: Self-pay | Admitting: Family Medicine

## 2013-05-29 ENCOUNTER — Encounter: Payer: Self-pay | Admitting: Family Medicine

## 2013-05-29 ENCOUNTER — Ambulatory Visit (INDEPENDENT_AMBULATORY_CARE_PROVIDER_SITE_OTHER): Payer: Medicare Other | Admitting: Family Medicine

## 2013-05-29 ENCOUNTER — Encounter: Payer: Self-pay | Admitting: *Deleted

## 2013-05-29 VITALS — BP 110/70 | HR 72 | Temp 98.2°F | Wt 132.8 lb

## 2013-05-29 DIAGNOSIS — N390 Urinary tract infection, site not specified: Secondary | ICD-10-CM

## 2013-05-29 DIAGNOSIS — M810 Age-related osteoporosis without current pathological fracture: Secondary | ICD-10-CM

## 2013-05-29 DIAGNOSIS — M549 Dorsalgia, unspecified: Secondary | ICD-10-CM

## 2013-05-29 DIAGNOSIS — K219 Gastro-esophageal reflux disease without esophagitis: Secondary | ICD-10-CM

## 2013-05-29 LAB — POCT URINALYSIS DIPSTICK
Glucose, UA: NEGATIVE
Nitrite, UA: POSITIVE
Urobilinogen, UA: 0.2

## 2013-05-29 MED ORDER — CYCLOBENZAPRINE HCL 10 MG PO TABS: 10.0000 mg | ORAL_TABLET | Freq: Two times a day (BID) | ORAL | Status: DC | PRN

## 2013-05-29 MED ORDER — TRAMADOL HCL 50 MG PO TABS
50.0000 mg | ORAL_TABLET | Freq: Two times a day (BID) | ORAL | Status: DC | PRN
Start: 2013-05-29 — End: 2013-06-30

## 2013-05-29 MED ORDER — OMEPRAZOLE 40 MG PO CPDR: DELAYED_RELEASE_CAPSULE | ORAL | Status: DC

## 2013-05-29 NOTE — Progress Notes (Signed)
  Subjective:    Patient ID: [name], female    DOB: [birth year], 67 y.o.   MRN: [id number]  HPI CC: back pain and discuss osteoporosis  H/o FM.  Back pain - seen last month with back pain, concern for recurrent cystitis (in h/o chronic cystitis).  Treated with 7d course keflex.  Persistent lower back pain, persistent urinary sxs.  Pain worse with activity.  Bilateral flank that travels up to shoulders. Currently with lower right back pain.  H/o chronic cystitis. Back pain intermittent for the last year.  Worsening over the last 3 months.   Denies shooting pain down legs.  Denies numbness or weakness of legs, denies new bowel/bladder incontinence. Denies inciting fall/injury. Has seen Dr. Vernie Ammons in past, was having bladder therapy at [facility], but unable to tolerate this.  UCx grew pansens Ecoli >100k.  completed 7d course keflex.  Persistent LUTS sxs.  GERD - Breakthrough sxs on omeprazole 20mg  daily.  Taking significant amount of tums.  Osteoporosis - DEXA scan showing -2.9 femur, -1.6 spine.  Intolerant of oral bisphosphonates in the past 2/2 dysphagi.  Normal Vit D and Ca.  Has false teeth.   Had B12 shot 05/20/2013.  Lab Results  Component Value Date   CREATININE 0.7 05/13/2013   Wt Readings from Last 3 Encounters:  05/29/13 132 lb 12 oz (60.215 kg)  05/20/13 125 lb 8 oz (56.926 kg)  02/18/13 135 lb (61.236 kg)    Past Medical History  Diagnosis Date  . B12 DEFICIENCY 05/03/2007  . FIBROMYALGIA 05/03/2007  . ANEMIA-NOS 09/25/2007  . HYPERLIPIDEMIA 12/19/2007  . HYPOTENSION, ORTHOSTATIC 12/06/2008  . OSTEOPOROSIS 08/2009    bisphosphonate on hold 2/2 dysphagia  . 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 LUPUS 02/08/2010  . GERD 02/22/2010  . Interstitial cystitis     Ottelin  . CAD (coronary artery disease) 01/2010    MI, Nishan  . History of CHF (congestive heart failure) 01/2010  . Cardiomyopathy 01/2010  . History of colon polyps 2004  . Takotsubo  cardiomyopathy 2008    due to E coli urosepsis  . Glaucoma 02/2013   Past Surgical History  Procedure Laterality Date  . Cholecystectomy      complication - low blood pressure  . Abdominal hysterectomy  1970s    first partial then with oophorectomy, complication - low blood pressure  . Cystoscopy    . Colonoscopy  06/2008    h/o polyps but latest WNL, rec rpt 10 yrs Juanda Chance)  . Dexa  04/2013    T -2.9 @ femur, -1.6 @ spine    Review of Systems Per HPI    Objective:   Physical Exam  Nursing note and vitals reviewed. Constitutional: She appears well-developed and well-nourished. No distress.  Musculoskeletal: She exhibits no edema.  Diffusely tender to palpation throughout. Tender midline entire spine and paraspinous mm. Tender with SLR bilaterally but no radiculopathy.   Neurological:  5/5 strength BLE       Assessment & Plan:

## 2013-05-29 NOTE — Patient Instructions (Addendum)
Let's recheck urine today given persistent symptoms. May take 2 omeprazole until you run out - then take 40mg  daily. We will call you next week to set up osteoporosis medicine infusions. Make sure you're taking vitamin D 1000 units daily. For pain - may take tramadol as needed along with fentanyl patches. Try flexeril as muscle relaxant for back as well . boths these medicines can make you sleepy. Return in 1 month to see me.  Try to get most or all of your calcium from your food--aim for 1000 mg/day for women up to 50 and men up to 70 and 1200 mg/day for women over 50 and men over 70. To figure out dietary calcium: 300 mg/day from all non dairy foods plus 300 mg per cup of milk, other dairy, or fortified juice. Non dairy foods that contain calcium: Kale, oranges, sardines, oatmeal, soy milk/soybeans, salmon, white beans, dried figs, turnip greens, almonds, broccoli, tofu.

## 2013-05-31 ENCOUNTER — Telehealth: Payer: Self-pay | Admitting: Family Medicine

## 2013-05-31 ENCOUNTER — Encounter: Payer: Self-pay | Admitting: Family Medicine

## 2013-05-31 DIAGNOSIS — M5136 Other intervertebral disc degeneration, lumbar region: Secondary | ICD-10-CM | POA: Insufficient documentation

## 2013-05-31 DIAGNOSIS — M51369 Other intervertebral disc degeneration, lumbar region without mention of lumbar back pain or lower extremity pain: Secondary | ICD-10-CM

## 2013-05-31 HISTORY — DX: Other intervertebral disc degeneration, lumbar region without mention of lumbar back pain or lower extremity pain: M51.369

## 2013-05-31 HISTORY — DX: Other intervertebral disc degeneration, lumbar region: M51.36

## 2013-05-31 NOTE — Assessment & Plan Note (Signed)
Diffuse back pain throughout entire spine along with FM tender points. Anticipate more due to fibro flare - no focal sxs. Will treat with flexeril muscle relaxant and tramadol for pain - to update me if sxs deteriorate. Await UCx results. RTC 1 mo for f/u.

## 2013-05-31 NOTE — Assessment & Plan Note (Signed)
Treated for cystitis last visit with 7d course keflex, however with persistent sxs as well as UA with concern for residual infection - will send culture - if positive, treat again, consider referral back to urology.

## 2013-05-31 NOTE — Assessment & Plan Note (Signed)
Breakthrough sxs with just omeprazole 20mg  daily - recommended increase to 40mg  daily.

## 2013-05-31 NOTE — Assessment & Plan Note (Addendum)
DEXA - residual osteoporosis with Tscore -2.9. Discussed options including IV bisphosphonate and prolia. Will set up with IV reclast. Also reviewed goal daily recommended calcium and vit D as well as dietary approaches to achieve goal.

## 2013-05-31 NOTE — Telephone Encounter (Signed)
I'd like to set pt up for reclast injections (IV bisphosphonate).  Can we fill out short stay orders for her?  Thanks. zolendronic acid 5mg  IV once yearly. Dx osteoporosis: 733.00 Intolerant of oral bisphosphonates.

## 2013-06-01 ENCOUNTER — Other Ambulatory Visit: Payer: Self-pay | Admitting: Family Medicine

## 2013-06-01 LAB — URINE CULTURE: Colony Count: >=100000

## 2013-06-01 MED ORDER — LEVOFLOXACIN 250 MG PO TABS: 250.0000 mg | ORAL_TABLET | Freq: Every day | ORAL | Status: DC

## 2013-06-03 ENCOUNTER — Other Ambulatory Visit: Payer: Self-pay | Admitting: Family Medicine

## 2013-06-03 DIAGNOSIS — M810 Age-related osteoporosis without current pathological fracture: Secondary | ICD-10-CM

## 2013-06-03 NOTE — Telephone Encounter (Signed)
Ordered for Short stay. She should receive a call from them to set this up.

## 2013-06-05 ENCOUNTER — Other Ambulatory Visit: Payer: Self-pay | Admitting: Family Medicine

## 2013-06-05 DIAGNOSIS — M810 Age-related osteoporosis without current pathological fracture: Secondary | ICD-10-CM

## 2013-06-18 ENCOUNTER — Encounter: Payer: Self-pay | Admitting: Family Medicine

## 2013-06-18 ENCOUNTER — Ambulatory Visit
Admission: RE | Admit: 2013-06-18 | Discharge: 2013-06-18 | Disposition: A | Discharge status: Home / Self Care | Payer: Medicare Other | Source: Ambulatory Visit | Attending: Family Medicine | Admitting: Family Medicine

## 2013-06-18 DIAGNOSIS — Z1231 Encounter for screening mammogram for malignant neoplasm of breast: Secondary | ICD-10-CM

## 2013-06-20 ENCOUNTER — Other Ambulatory Visit: Payer: Self-pay

## 2013-06-20 ENCOUNTER — Encounter: Payer: Self-pay | Admitting: *Deleted

## 2013-06-20 ENCOUNTER — Other Ambulatory Visit: Payer: Self-pay | Admitting: Family Medicine

## 2013-06-20 DIAGNOSIS — R52 Pain, unspecified: Secondary | ICD-10-CM

## 2013-06-20 MED ORDER — FENTANYL 50 MCG/HR TD PT72: 1.0000 | MEDICATED_PATCH | TRANSDERMAL | Status: DC

## 2013-06-20 NOTE — Telephone Encounter (Signed)
Pt left v/m requesting rx fentanyl. Call when ready for pick up. 

## 2013-06-20 NOTE — Telephone Encounter (Signed)
Message left notifying patient. Rx placed up front for pick up. 

## 2013-06-20 NOTE — Telephone Encounter (Signed)
Printed and placed in [name]'s box. 

## 2013-06-30 ENCOUNTER — Encounter: Payer: Self-pay | Admitting: Family Medicine

## 2013-06-30 ENCOUNTER — Ambulatory Visit (INDEPENDENT_AMBULATORY_CARE_PROVIDER_SITE_OTHER): Payer: Medicare Other | Admitting: Family Medicine

## 2013-06-30 ENCOUNTER — Other Ambulatory Visit: Payer: Self-pay | Admitting: Family Medicine

## 2013-06-30 ENCOUNTER — Telehealth: Payer: Self-pay | Admitting: *Deleted

## 2013-06-30 VITALS — BP 110/80 | HR 76 | Temp 98.0°F | Wt 132.5 lb

## 2013-06-30 DIAGNOSIS — M549 Dorsalgia, unspecified: Secondary | ICD-10-CM

## 2013-06-30 DIAGNOSIS — M810 Age-related osteoporosis without current pathological fracture: Secondary | ICD-10-CM

## 2013-06-30 DIAGNOSIS — N390 Urinary tract infection, site not specified: Secondary | ICD-10-CM

## 2013-06-30 LAB — POCT URINALYSIS DIPSTICK
Ketones, UA: NEGATIVE
Spec Grav, UA: 1.01
Urobilinogen, UA: 0.2
pH, UA: 8

## 2013-06-30 MED ORDER — CYCLOBENZAPRINE HCL 5 MG PO TABS: 5.0000 mg | ORAL_TABLET | Freq: Two times a day (BID) | ORAL | Status: DC | PRN

## 2013-06-30 MED ORDER — TRAMADOL HCL 50 MG PO TABS: 50.0000 mg | ORAL_TABLET | Freq: Two times a day (BID) | ORAL | Status: DC | PRN

## 2013-06-30 NOTE — Assessment & Plan Note (Signed)
see above. If positive culture, will recommend referral back to urology.

## 2013-06-30 NOTE — Telephone Encounter (Signed)
Message copied by Sabino Donovan on Mon Jun 30, 2013  4:22 PM ------      Message from: [name]      Created: Mon Jun 30, 2013  1:34 PM       plz phone in tramadol script ordered today at office visit.  Thanks. ------

## 2013-06-30 NOTE — Progress Notes (Signed)
  Subjective:    Patient ID: [name], female    DOB: 1946-02-18, 67 y.o.   MRN: [id number]  HPI CC: 1 mo f/u  See prior note for details. Late 04/2013 found to have >100k pansen Ecoli UTI, treated with keflex 7d course.  Persistent lower back pain - found to have >100k pseudomonas UTI - treated with 10d course levofloxacin.  Finished meds - but notes persistent urinary pressure with urgency  Hard to tell if bladder inflammation or infection in h/o interstitial cystitis.  Denies fevers/chills, dysuria, hematuria, nausea/vomiting.  Lower middle back pain - worse with walking. Treated with flexeril and tramadol last visit. Tramadol helps when she takes it, takes about 2 pills per day (along with her klonopin and fentanyl).  Flexeril too sedating but does relax back muscles.  Osteoporosis - DEXA scan showing -2.9 femur, -1.6 spine. Intolerant of oral bisphosphonates in the past 2/2 dysphagia. Normal Vit D and Ca. Has false teeth.  Interested in IV reclast - this was set up last visit.  She never heard about this. H/o b12 deficiency - gets 1000mg  IM injections every 3 months.  Last shot was 05/20/2013 H/o FM.  Past Medical History  Diagnosis Date  . B12 DEFICIENCY 05/03/2007  . FIBROMYALGIA 05/03/2007  . ANEMIA-NOS 09/25/2007  . HYPERLIPIDEMIA 12/19/2007  . HYPOTENSION, ORTHOSTATIC 12/06/2008  . OSTEOPOROSIS 08/2009    bisphosphonate on hold 2/2 dysphagia  . 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 LUPUS 02/08/2010  . GERD 02/22/2010  . Interstitial cystitis     Ottelin  . CAD (coronary artery disease) 01/2010    MI, Nishan  . History of CHF (congestive heart failure) 01/2010  . Cardiomyopathy 01/2010  . History of colon polyps 2004  . Takotsubo cardiomyopathy 2008    due to E coli urosepsis  . Glaucoma 02/2013     Review of Systems Per HPI    Objective:   Physical Exam  Nursing note and vitals reviewed. Constitutional: She appears well-developed and  well-nourished. No distress.  HENT:  Head: Normocephalic and atraumatic.  Mouth/Throat: Oropharynx is clear and moist. No oropharyngeal exudate.  Eyes: Conjunctivae and EOM are normal. Pupils are equal, round, and reactive to light. No scleral icterus.  Neck: Normal range of motion. Neck supple.  Cardiovascular: Normal rate, regular rhythm, normal heart sounds and intact distal pulses.   No murmur heard. Pulmonary/Chest: Effort normal and breath sounds normal. No respiratory distress. She has no wheezes. She has no rales.  Musculoskeletal: She exhibits no edema.  Midline spine tenderness thoracic and lumbar spine. + paraspinous mm tenderness diffusely  Skin: Skin is warm and dry. No rash noted.       Assessment & Plan:

## 2013-06-30 NOTE — Telephone Encounter (Signed)
Rx called in as directed.   

## 2013-06-30 NOTE — Assessment & Plan Note (Signed)
I will have [name] check on scheduling IV Reclast at short stay Calcium and vit D levels WNL.

## 2013-06-30 NOTE — Patient Instructions (Signed)
May continue tramadol for pain, flexeril for muscle tightness of fibromyalgia (I've sent in lower dose of fibromyalgia). Urine checked today for persistent infection. Good to see you today, call us with questions I will have Selena Batten check on reclast for osteoporosis.

## 2013-06-30 NOTE — Addendum Note (Signed)
Addended by: Eustaquio Boyden on: 06/30/2013 01:50 PM   Modules accepted: Orders

## 2013-06-30 NOTE — Addendum Note (Signed)
Addended by: Patience Musca on: 06/30/2013 01:41 PM   Modules accepted: Orders

## 2013-06-30 NOTE — Assessment & Plan Note (Addendum)
Anticipate most of symptoms coming from fibromyalgia. However, in h/o recurrent UTIs and chronic cystitis, recheck UA today - if consistent with infection, will culture and recommend she f/u with Dr. Vernie Ammons.  He had discussed possible referral to [facility] for interstitial cystitis. Continue tramadol and flexeril PRN. Pt agrees with plan.

## 2013-07-01 ENCOUNTER — Other Ambulatory Visit: Payer: Self-pay | Admitting: Family Medicine

## 2013-07-01 ENCOUNTER — Telehealth: Payer: Self-pay | Admitting: Family Medicine

## 2013-07-01 DIAGNOSIS — M810 Age-related osteoporosis without current pathological fracture: Secondary | ICD-10-CM

## 2013-07-01 MED ORDER — CALCIUM + D3 600-200 MG-UNIT PO TABS: 2.0000 | ORAL_TABLET | Freq: Every day | ORAL | Status: DC

## 2013-07-01 NOTE — Telephone Encounter (Signed)
Filled out form and placed in [name]'s box. Please have patient start taking calcium 600mg  twice daily

## 2013-07-02 ENCOUNTER — Other Ambulatory Visit (INDEPENDENT_AMBULATORY_CARE_PROVIDER_SITE_OTHER): Payer: Medicare Other

## 2013-07-02 ENCOUNTER — Other Ambulatory Visit: Payer: Self-pay | Admitting: Family Medicine

## 2013-07-02 DIAGNOSIS — M810 Age-related osteoporosis without current pathological fracture: Secondary | ICD-10-CM

## 2013-07-02 LAB — BASIC METABOLIC PANEL
BUN: 17 mg/dL (ref 6–23)
CO2: 31 mEq/L (ref 19–32)
Chloride: 102 mEq/L (ref 96–112)
Glucose, Bld: 85 mg/dL (ref 70–99)
Potassium: 3.6 mEq/L (ref 3.5–5.1)
Sodium: 139 mEq/L (ref 135–145)

## 2013-07-02 MED ORDER — CEPHALEXIN 500 MG PO CAPS: 500.0000 mg | ORAL_CAPSULE | Freq: Two times a day (BID) | ORAL | Status: DC

## 2013-07-03 NOTE — Telephone Encounter (Signed)
Patient notified and will start calcium as directed.

## 2013-07-11 ENCOUNTER — Other Ambulatory Visit (HOSPITAL_COMMUNITY): Payer: Self-pay | Admitting: *Deleted

## 2013-07-14 ENCOUNTER — Encounter (HOSPITAL_COMMUNITY)
Admission: RE | Admit: 2013-07-14 | Discharge: 2013-07-14 | Disposition: A | Discharge status: Home / Self Care | Payer: Medicare Other | Source: Ambulatory Visit | Attending: Family Medicine | Admitting: Family Medicine

## 2013-07-14 VITALS — BP 99/68 | HR 71 | Temp 98.1°F | Resp 20 | Ht 65.5 in | Wt 135.0 lb

## 2013-07-14 DIAGNOSIS — M810 Age-related osteoporosis without current pathological fracture: Secondary | ICD-10-CM

## 2013-07-14 MED ORDER — ZOLEDRONIC ACID 5 MG/100ML IV SOLN
INTRAVENOUS | Status: AC
  Filled 2013-07-14: qty 100

## 2013-07-14 MED ORDER — ZOLEDRONIC ACID 5 MG/100ML IV SOLN
5.0000 mg | Freq: Once | INTRAVENOUS | Status: AC
  Administered 2013-07-14: 5 mg via INTRAVENOUS

## 2013-07-22 ENCOUNTER — Other Ambulatory Visit: Payer: Self-pay

## 2013-07-22 DIAGNOSIS — R52 Pain, unspecified: Secondary | ICD-10-CM

## 2013-07-22 MED ORDER — FENTANYL 50 MCG/HR TD PT72: 1.0000 | MEDICATED_PATCH | TRANSDERMAL | Status: DC

## 2013-07-22 NOTE — Telephone Encounter (Signed)
Printed and placed in my outbox.

## 2013-07-22 NOTE — Telephone Encounter (Signed)
Pt left v/m requesting rx fentanyl patch. Call when ready for pick up. 

## 2013-07-22 NOTE — Telephone Encounter (Signed)
Patient notified that script is up front and ready for pickup. 

## 2013-08-15 ENCOUNTER — Other Ambulatory Visit: Payer: Self-pay

## 2013-08-15 NOTE — Telephone Encounter (Signed)
Pt left v/m requesting tramadol refill to [facility].Please advise.

## 2013-08-17 MED ORDER — TRAMADOL HCL 50 MG PO TABS: 50.0000 mg | ORAL_TABLET | Freq: Two times a day (BID) | ORAL | Status: DC | PRN

## 2013-08-17 NOTE — Telephone Encounter (Signed)
plesae call in.

## 2013-08-18 NOTE — Telephone Encounter (Signed)
Medication phoned to pharmacy.  

## 2013-08-20 ENCOUNTER — Other Ambulatory Visit: Payer: Self-pay

## 2013-08-20 DIAGNOSIS — R52 Pain, unspecified: Secondary | ICD-10-CM

## 2013-08-20 NOTE — Telephone Encounter (Signed)
Pt left v/m requesting rx Fentanyl patch. Call when ready for pick up.

## 2013-08-21 MED ORDER — FENTANYL 50 MCG/HR TD PT72: 1.0000 | MEDICATED_PATCH | TRANSDERMAL | Status: DC

## 2013-08-21 NOTE — Telephone Encounter (Signed)
Ms. Gains notified prescription is ready to be picked up at front desk.

## 2013-08-21 NOTE — Telephone Encounter (Signed)
LAst refill 8/26, last appt within this month. Okay to refill.

## 2013-08-28 ENCOUNTER — Ambulatory Visit: Payer: Medicare Other

## 2013-09-02 ENCOUNTER — Ambulatory Visit: Payer: Medicare Other

## 2013-09-02 ENCOUNTER — Ambulatory Visit (INDEPENDENT_AMBULATORY_CARE_PROVIDER_SITE_OTHER): Payer: Medicare Other | Admitting: Family Medicine

## 2013-09-02 ENCOUNTER — Encounter: Payer: Self-pay | Admitting: Family Medicine

## 2013-09-02 VITALS — BP 110/72 | HR 84 | Temp 97.9°F | Wt 129.8 lb

## 2013-09-02 DIAGNOSIS — Z23 Encounter for immunization: Secondary | ICD-10-CM

## 2013-09-02 DIAGNOSIS — R300 Dysuria: Secondary | ICD-10-CM

## 2013-09-02 DIAGNOSIS — I951 Orthostatic hypotension: Secondary | ICD-10-CM

## 2013-09-02 DIAGNOSIS — E538 Deficiency of other specified B group vitamins: Secondary | ICD-10-CM

## 2013-09-02 HISTORY — DX: Dysuria: R30.0

## 2013-09-02 LAB — POCT URINALYSIS DIPSTICK
Blood, UA: NEGATIVE
Ketones, UA: NEGATIVE
Protein, UA: NEGATIVE
pH, UA: 7.5

## 2013-09-02 MED ORDER — CYANOCOBALAMIN 1000 MCG/ML IJ SOLN
1000.0000 ug | Freq: Once | INTRAMUSCULAR | Status: AC
  Administered 2013-09-02: 1000 ug via INTRAMUSCULAR

## 2013-09-02 NOTE — Assessment & Plan Note (Signed)
In h/o fibromyalgia, difficulty distinguishing between that and UTI possibility H/o UTIs. Micro reassuring - have sent UCx regardless and will call her with results.   Continue macrobid one qhs for now.

## 2013-09-02 NOTE — Addendum Note (Signed)
Addended by: Josph Macho A on: 09/02/2013 11:13 AM   Modules accepted: Orders

## 2013-09-02 NOTE — Assessment & Plan Note (Signed)
Encouraged staying well hydrated - will monitor temporal relation of fentanyl patch to low bp epsidoes

## 2013-09-02 NOTE — Patient Instructions (Signed)
Keep an eye on relation of fentanyl patch to low blood pressure episodes. Urine overall looking ok - but I have sent culture to verify if infection. We will call you with results. Watch for worsening pain or weakness or fever >101. Flu and b12 shots today.

## 2013-09-02 NOTE — Progress Notes (Signed)
  Subjective:    Patient ID: [name], female    DOB: Apr 03, 1946, 67 y.o.   MRN: [id number]  HPI CC: ?UTI  Seen here in August - with UTI - >100k Klebsiella. Seen by Dr. Vernie Ammons - referred to Dr. Logan Bores.  Seen in interim by Dr. Vernie Ammons with rpt UTI, treated again.  Taking macrobid daily for UTI ppx.  To see Dr. Logan Bores at [facility] - appt scheduled 09/2013.  These sxs started several days ago - dysuria, urgency, frequency.  + left flank pain.  No hematuria.  No nausea/vomiting, fevers or chills.  No abd pain.  Has had some spells where blood pressure has dropped - happening qod for the last week.  Has checked bp and running 90/50s.  Feels faint and lightheaded with this.  H/o syncope due to this.  No recent syncope.  Past Medical History  Diagnosis Date  . B12 DEFICIENCY 05/03/2007  . FIBROMYALGIA 05/03/2007  . ANEMIA-NOS 09/25/2007  . HYPERLIPIDEMIA 12/19/2007  . HYPOTENSION, ORTHOSTATIC 12/06/2008  . OSTEOPOROSIS 08/2009    bisphosphonate on hold 2/2 dysphagia  . 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 LUPUS 02/08/2010  . GERD 02/22/2010  . Interstitial cystitis     Ottelin  . CAD (coronary artery disease) 01/2010    MI, Nishan  . History of CHF (congestive heart failure) 01/2010  . Cardiomyopathy 01/2010  . History of colon polyps 2004  . Takotsubo cardiomyopathy 2008    due to E coli urosepsis  . Glaucoma 02/2013     Review of Systems Per HPI    Objective:   Physical Exam  Nursing note and vitals reviewed. Constitutional: She appears well-developed and well-nourished. No distress.  Abdominal: Soft. Normal appearance and bowel sounds are normal. She exhibits no distension and no mass. There is no hepatosplenomegaly. There is tenderness (diffuse discomfort to palpation). There is CVA tenderness (L>R discomfort). There is no rigidity, no rebound, no guarding and negative Murphy's sign.  Musculoskeletal: She exhibits no edema.       Assessment & Plan:

## 2013-09-22 ENCOUNTER — Other Ambulatory Visit: Payer: Self-pay

## 2013-09-22 DIAGNOSIS — R52 Pain, unspecified: Secondary | ICD-10-CM

## 2013-09-22 MED ORDER — FENTANYL 50 MCG/HR TD PT72: 1.0000 | MEDICATED_PATCH | TRANSDERMAL | Status: DC

## 2013-09-22 NOTE — Telephone Encounter (Signed)
Printed and placed in [name]'s box. 

## 2013-09-22 NOTE — Telephone Encounter (Signed)
Pt left vm requesting rx for fentanyl patch; call when ready for pickup.

## 2013-09-23 NOTE — Telephone Encounter (Signed)
Patient notified and Rx placed up front for pick up. 

## 2013-10-03 ENCOUNTER — Other Ambulatory Visit: Payer: Self-pay

## 2013-10-03 MED ORDER — CLONAZEPAM 0.5 MG PO TABS: ORAL_TABLET | ORAL | Status: DC

## 2013-10-03 NOTE — Telephone Encounter (Signed)
Please call in.  Further refills per PCP.  Thanks.  

## 2013-10-03 NOTE — Telephone Encounter (Signed)
Medication phoned to pharmacy.  

## 2013-10-03 NOTE — Telephone Encounter (Signed)
Pt left v/m requesting refill clonazepam to [facility]. Pt had previously filled by Dr Timoteo Gaul.Please advise.

## 2013-10-21 ENCOUNTER — Other Ambulatory Visit: Payer: Self-pay

## 2013-10-21 DIAGNOSIS — R52 Pain, unspecified: Secondary | ICD-10-CM

## 2013-10-21 MED ORDER — FENTANYL 50 MCG/HR TD PT72: 50.0000 ug | MEDICATED_PATCH | TRANSDERMAL | Status: DC

## 2013-10-21 NOTE — Telephone Encounter (Signed)
Printed and placed in [name]'s box. 

## 2013-10-21 NOTE — Telephone Encounter (Signed)
Pt left v/m requesting rx Fentanyl. Call when ready for pick up.

## 2013-10-21 NOTE — Telephone Encounter (Signed)
Patient notified and Rx placed up front for pick up. Advised to bring ID. Patient verbalized understanding.

## 2013-11-12 ENCOUNTER — Encounter: Payer: Self-pay | Admitting: Family Medicine

## 2013-11-12 ENCOUNTER — Ambulatory Visit (INDEPENDENT_AMBULATORY_CARE_PROVIDER_SITE_OTHER): Payer: Medicare Other | Admitting: Family Medicine

## 2013-11-12 VITALS — BP 100/70 | HR 80 | Temp 97.9°F | Wt 129.2 lb

## 2013-11-12 DIAGNOSIS — N390 Urinary tract infection, site not specified: Secondary | ICD-10-CM

## 2013-11-12 DIAGNOSIS — M810 Age-related osteoporosis without current pathological fracture: Secondary | ICD-10-CM

## 2013-11-12 DIAGNOSIS — R52 Pain, unspecified: Secondary | ICD-10-CM

## 2013-11-12 DIAGNOSIS — R21 Rash and other nonspecific skin eruption: Secondary | ICD-10-CM

## 2013-11-12 DIAGNOSIS — E538 Deficiency of other specified B group vitamins: Secondary | ICD-10-CM

## 2013-11-12 DIAGNOSIS — F4321 Adjustment disorder with depressed mood: Secondary | ICD-10-CM

## 2013-11-12 DIAGNOSIS — F321 Major depressive disorder, single episode, moderate: Secondary | ICD-10-CM | POA: Insufficient documentation

## 2013-11-12 HISTORY — DX: Major depressive disorder, single episode, moderate: F32.1

## 2013-11-12 MED ORDER — CLONAZEPAM 0.5 MG PO TABS: ORAL_TABLET | ORAL | Status: DC

## 2013-11-12 MED ORDER — TRAMADOL HCL 50 MG PO TABS: 50.0000 mg | ORAL_TABLET | Freq: Two times a day (BID) | ORAL | Status: DC | PRN

## 2013-11-12 MED ORDER — FENTANYL 50 MCG/HR TD PT72: 50.0000 ug | MEDICATED_PATCH | TRANSDERMAL | Status: DC

## 2013-11-12 MED ORDER — CYANOCOBALAMIN 1000 MCG/ML IJ SOLN
1000.0000 ug | Freq: Once | INTRAMUSCULAR | Status: AC
  Administered 2013-11-12: 1000 ug via INTRAMUSCULAR

## 2013-11-12 NOTE — Assessment & Plan Note (Signed)
Trial off cellcept with close monitoring

## 2013-11-12 NOTE — Assessment & Plan Note (Signed)
Continue Q3 mo shots . Received today.

## 2013-11-12 NOTE — Assessment & Plan Note (Signed)
reclast infusion done 06/2013.  Will receive once a year Intolerant of oral bisphosphonates 2/2 GI upset.

## 2013-11-12 NOTE — Progress Notes (Signed)
   Subjective:    Patient ID: [name], female    DOB: Nov 18, 1946, 67 y.o.   MRN: [id number]  HPI CC: 6 mo f/u  [name] presents for a 6 mo follow up office visit.  Her husband recently passed away mid 09-11-2013 after routine orthopedic hip replacement pain.  Osteoporosis - continues cal/vit D, and started on reclast 06/2013.  This is once yearly.  Interstitial cystitis and recurrent bladder infections - to see Dr. Logan Bores on Friday (urology) at [facility].  To undergo cystoscopy.  Sent by Dr. Vernie Ammons.  Dermatitis - currently only on cellcept 250mg  once daily.  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  Has had eval at two tertiary centers.  Pt interested in trial off this medication.   Past Medical History  Diagnosis Date  . B12 DEFICIENCY 05/03/2007  . FIBROMYALGIA 05/03/2007  . ANEMIA-NOS 09/25/2007  . HYPERLIPIDEMIA 12/19/2007  . HYPOTENSION, ORTHOSTATIC 12/06/2008  . OSTEOPOROSIS Sep 11, 2009    bisphosphonate on hold 2/2 dysphagia, on reclast done 06/2013  . 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 LUPUS 02/08/2010  . GERD 02/22/2010  . Interstitial cystitis     Ottelin  . CAD (coronary artery disease) 01/2010    MI, Nishan  . History of CHF (congestive heart failure) 01/2010  . Cardiomyopathy 01/2010  . History of colon polyps 2004  . Takotsubo cardiomyopathy 2008    due to E coli urosepsis  . Glaucoma 02/2013     Review of Systems Per HPI    Objective:   Physical Exam  Nursing note and vitals reviewed. Constitutional: She appears well-developed and well-nourished. No distress.  HENT:  Mouth/Throat: Oropharynx is clear and moist. No oropharyngeal exudate.  Cardiovascular: Normal rate, regular rhythm, normal heart sounds and intact distal pulses.   No murmur heard. Pulmonary/Chest: Effort normal and breath sounds normal. No respiratory distress. She has no wheezes. She has no rales.  Musculoskeletal: She exhibits no edema.  Skin: Skin is  warm and dry. No rash noted.  Psychiatric: She has a normal mood and affect.       Assessment & Plan:

## 2013-11-12 NOTE — Progress Notes (Signed)
Pre-visit discussion using our clinic review tool. No additional management support is needed unless otherwise documented below in the visit note.  

## 2013-11-12 NOTE — Patient Instructions (Signed)
Let's do trial off cellcept - may stop this.  We will monitor for return of skin rash. Let me know if this happens. We've refilled tramadol and klonopin. Good to see you today, call us with questions

## 2013-11-12 NOTE — Assessment & Plan Note (Signed)
Reviewed normal grieving process.

## 2013-11-12 NOTE — Assessment & Plan Note (Signed)
pending urologic procedure with Dr. Logan Bores at [facility] this week. Thought IC + recurrent UTIs.

## 2013-12-07 DIAGNOSIS — F1190 Opioid use, unspecified, uncomplicated: Secondary | ICD-10-CM

## 2013-12-07 HISTORY — DX: Opioid use, unspecified, uncomplicated: F11.90

## 2013-12-21 ENCOUNTER — Other Ambulatory Visit: Payer: Self-pay | Admitting: Family Medicine

## 2013-12-21 NOTE — Telephone Encounter (Signed)
plz phone in. 

## 2013-12-22 ENCOUNTER — Other Ambulatory Visit: Payer: Self-pay

## 2013-12-22 DIAGNOSIS — R52 Pain, unspecified: Secondary | ICD-10-CM

## 2013-12-22 MED ORDER — FENTANYL 50 MCG/HR TD PT72: 50.0000 ug | MEDICATED_PATCH | TRANSDERMAL | Status: DC

## 2013-12-22 NOTE — Telephone Encounter (Signed)
Message left advising patient. Rx placed up front for pick up. 

## 2013-12-22 NOTE — Telephone Encounter (Signed)
Pt left v/m requesting rx fentanyl patch . Call when ready for print out.

## 2013-12-22 NOTE — Telephone Encounter (Signed)
Printed and placed in [name]'s box. 

## 2013-12-22 NOTE — Telephone Encounter (Signed)
Rx's called in as directed.  

## 2013-12-28 HISTORY — PX: CYSTOSCOPY: SUR368

## 2014-01-27 ENCOUNTER — Other Ambulatory Visit: Payer: Self-pay

## 2014-01-27 DIAGNOSIS — R52 Pain, unspecified: Secondary | ICD-10-CM

## 2014-01-27 NOTE — Telephone Encounter (Signed)
Pt left v/m requesting rx fentanyl. Call when ready for pick up. 

## 2014-01-28 MED ORDER — FENTANYL 50 MCG/HR TD PT72
50.0000 ug | MEDICATED_PATCH | TRANSDERMAL | Status: DC
Start: 2014-01-28 — End: 2014-03-09

## 2014-01-28 NOTE — Telephone Encounter (Signed)
Printed and placed in [name]'s box. 

## 2014-01-28 NOTE — Telephone Encounter (Signed)
Patient notified and Rx placed up front for pick up. 

## 2014-02-05 ENCOUNTER — Ambulatory Visit (INDEPENDENT_AMBULATORY_CARE_PROVIDER_SITE_OTHER): Payer: Medicare Other | Admitting: Family Medicine

## 2014-02-05 ENCOUNTER — Encounter: Payer: Self-pay | Admitting: Family Medicine

## 2014-02-05 ENCOUNTER — Telehealth: Payer: Self-pay

## 2014-02-05 VITALS — BP 104/60 | HR 72 | Temp 98.0°F | Wt 130.1 lb

## 2014-02-05 DIAGNOSIS — R259 Unspecified abnormal involuntary movements: Secondary | ICD-10-CM

## 2014-02-05 DIAGNOSIS — M79601 Pain in right arm: Secondary | ICD-10-CM | POA: Insufficient documentation

## 2014-02-05 DIAGNOSIS — M79609 Pain in unspecified limb: Secondary | ICD-10-CM

## 2014-02-05 DIAGNOSIS — R251 Tremor, unspecified: Secondary | ICD-10-CM

## 2014-02-05 DIAGNOSIS — E538 Deficiency of other specified B group vitamins: Secondary | ICD-10-CM

## 2014-02-05 DIAGNOSIS — G9050 Complex regional pain syndrome I, unspecified: Secondary | ICD-10-CM

## 2014-02-05 MED ORDER — NAPROXEN 500 MG PO TABS: ORAL_TABLET | ORAL | Status: DC

## 2014-02-05 MED ORDER — GABAPENTIN 100 MG PO CAPS: 100.0000 mg | ORAL_CAPSULE | Freq: Two times a day (BID) | ORAL | Status: DC

## 2014-02-05 MED ORDER — CYANOCOBALAMIN 1000 MCG/ML IJ SOLN
1000.0000 ug | Freq: Once | INTRAMUSCULAR | Status: AC
  Administered 2014-02-05: 1000 ug via INTRAMUSCULAR

## 2014-02-05 NOTE — Assessment & Plan Note (Addendum)
Worsening R handed tremor over last several months. No other parkinsonian features, anticipate more related to RSD. Treat with gabapentin 100mg  bid with option to increase to 200mg  bid. Discussed common side effects of gabapentin including sedation, dizziness.

## 2014-02-05 NOTE — Telephone Encounter (Signed)
Appears gabapentin was not sent in.

## 2014-02-05 NOTE — Assessment & Plan Note (Signed)
Check vit B12 next blood work.

## 2014-02-05 NOTE — Telephone Encounter (Signed)
pts mother left v/m; [name] was seen today and [name] thought Dr Darnell Level was going to send in 2 prescriptions today today [facility]. [name] said only one med was at pharmacy and [name] does not know name of second med.Please advise.

## 2014-02-05 NOTE — Progress Notes (Signed)
Pre visit review using our clinic review tool, if applicable. No additional management support is needed unless otherwise documented below in the visit note. 

## 2014-02-05 NOTE — Telephone Encounter (Signed)
Plz notify this was sent in.  ?

## 2014-02-05 NOTE — Progress Notes (Signed)
BP 104/60  Pulse 72  Temp(Src) 98 F (36.7 C) (Oral)  Wt 130 lb 1.9 oz (59.022 kg)   CC: R arm pain, tremor Subjective:    Patient ID: [name], female    DOB: Apr 24, 1946, 68 y.o.   MRN: [id number]  HPI: [name] is a 68 y.o. female presenting on 02/05/2014 with Arm Pain   Right handed. Takes klonopin 0.5mg  bid regularly as well as fentanyl and tramadol 50mg  bid.  R arm pain - worsening recently.  H/o RSD right arm after fall at work.  Pain shooting up into shoulder now.  Trouble laying on right arm - worsening sleep.  This weekend pain worsened as well.  Worse at night, worse with rest, worse with depression and stress.  Describes sharp stabbing pain from R wrist to shoulder.  Increase in tremor R arm.  No tremor endorsed of left arm.  Notes worse tremor with excitement.  Ongoing for the last year but worse over last few months - since husband passed.  No anosmia.   Off cellcept for last 2 months - no recurrence of rash so far.  Relevant past medical, surgical, family and social history reviewed and updated as indicated.  Allergies and medications reviewed and updated. Current Outpatient Prescriptions on File Prior to Visit  Medication Sig  . Calcium Carb-Cholecalciferol (CALCIUM + D3) 600-200 MG-UNIT TABS Take 2 tablets by mouth daily.  . cholecalciferol (VITAMIN D) 1000 UNITS tablet Take 1,000 Units by mouth daily.  Marland Kitchen conjugated estrogens (PREMARIN) vaginal cream Place 1 Applicatorful vaginally daily.  . cyanocobalamin 1000 MCG/ML injection Inject 1,000 mcg into the muscle every 3 (three) months.    . fentaNYL (DURAGESIC - DOSED MCG/HR) 50 MCG/HR Place 1 patch (50 mcg total) onto the skin every 3 (three) days.  . nitrofurantoin (MACRODANTIN) 50 MG capsule Take 50 mg by mouth at bedtime.  Marland Kitchen omeprazole (PRILOSEC) 40 MG capsule TAKE 1 TABLET BY MOUTH EVERY DAY  . pentosan polysulfate (ELMIRON) 100 MG capsule Take 200 mg by mouth 2 (two) times daily.  . polyethylene  glycol (GLYCOLAX) packet Take 17 g by mouth as needed.    . senna (SENOKOT) 8.6 MG tablet Take 1 tablet by mouth daily.    . traMADol (ULTRAM) 50 MG tablet TAKE 1 TABLET BY MOUTH TWICE A DAY AS NEEDED  . triamcinolone cream (KENALOG) 0.5 % APPLY TOPICALLY 3 (THREE) TIMES DAILY.  Marland Kitchen Zoledronic Acid (RECLAST IV) Inject into the vein. Yearly, 06/2013   No current facility-administered medications on file prior to visit.    Review of Systems Per HPI unless specifically indicated above    Objective:    BP 104/60  Pulse 72  Temp(Src) 98 F (36.7 C) (Oral)  Wt 130 lb 1.9 oz (59.022 kg)  Physical Exam  Vitals reviewed. Constitutional: She is oriented to person, place, and time. She appears well-developed and well-nourished. No distress.  Musculoskeletal: She exhibits no edema.  Decreased ROM in R shoulder flexion/extension, abduction. Decreased ROM in R elbow full flexion/extension. Decreased ROM in R wrist flexion/extension. All 2/2 pain. Tender to palpation bilateral subacromial bursas and AC joint, R>L  Neurological: She is alert and oriented to person, place, and time.  No masked fascies, no shuffling gait. No cogwheel rigidity. Tremor present R upper extremity, not present L upper extremity       Assessment & Plan:   Problem List Items Addressed This Visit   B12 DEFICIENCY     Check vit B12  next blood work.    REFLEX SYMPATHETIC DYSTROPHY     Contributing to worsening R arm pain.  See below    Relevant Medications      clonazePAM (KLONOPIN) 0.5 MG tablet   Right arm pain      Difficult exam 2/2 decreased mobility of R arm/shoulder 2/2 RSD. Anticipate flare of RDS pain, but possible superimposed shoulder bursitis/tendonitis of RTC. Treat with naprosyn 500mg  bid for 1 wk with food then PRN. Will also start gabapentin for possible neuropathic component. Continue tramadol and fentanyl patch. Lab Results  Component Value Date   CREATININE 0.9 07/02/2013      Tremor      Worsening R handed tremor over last several months. No other parkinsonian features, anticipate more related to RSD. Treat with gabapentin 100mg  bid with option to increase to 200mg  bid. Discussed common side effects of gabapentin including sedation, dizziness.     Other Visit Diagnoses   B12 deficiency    -  Primary    Relevant Medications       cyanocobalamin ((VITAMIN B-12)) injection 1,000 mcg (Completed)        Follow up plan: Return if symptoms worsen or fail to improve.

## 2014-02-05 NOTE — Assessment & Plan Note (Addendum)
Difficult exam 2/2 decreased mobility of R arm/shoulder 2/2 RSD. Anticipate flare of RDS pain, but possible superimposed shoulder bursitis/tendonitis of RTC. Treat with naprosyn 500mg  bid for 1 wk with food then PRN. Will also start gabapentin for possible neuropathic component. Continue tramadol and fentanyl patch. Lab Results  Component Value Date   CREATININE 0.9 07/02/2013

## 2014-02-05 NOTE — Assessment & Plan Note (Signed)
Contributing to worsening R arm pain.  See below

## 2014-02-05 NOTE — Patient Instructions (Addendum)
Let's stop amitriptyline and flexeril (cyclobenzaprine). B12 shot today.  We will check levels next blood work in June. Start gabapentin 100mg  twice daily - may go up to 200mg  twice daily if tolerating well.

## 2014-02-06 NOTE — Telephone Encounter (Signed)
Patient notified

## 2014-02-09 ENCOUNTER — Other Ambulatory Visit: Payer: Self-pay | Admitting: Family Medicine

## 2014-02-09 NOTE — Telephone Encounter (Signed)
Plz phone in

## 2014-02-09 NOTE — Telephone Encounter (Signed)
Ok to refill 

## 2014-02-09 NOTE — Telephone Encounter (Signed)
Rx called in as directed.   

## 2014-03-09 ENCOUNTER — Other Ambulatory Visit: Payer: Self-pay

## 2014-03-09 DIAGNOSIS — R52 Pain, unspecified: Secondary | ICD-10-CM

## 2014-03-09 NOTE — Telephone Encounter (Signed)
Pt left v/m requsting rx fentanyl. Call when ready for pick up.

## 2014-03-10 ENCOUNTER — Other Ambulatory Visit: Payer: Self-pay | Admitting: Family Medicine

## 2014-03-10 MED ORDER — FENTANYL 50 MCG/HR TD PT72: 50.0000 ug | MEDICATED_PATCH | TRANSDERMAL | Status: DC

## 2014-03-10 NOTE — Telephone Encounter (Signed)
printed

## 2014-03-10 NOTE — Telephone Encounter (Signed)
Message left notifying patient. Rx placed up front for pick up. 

## 2014-03-10 NOTE — Telephone Encounter (Signed)
Ok to refill 

## 2014-03-11 NOTE — Telephone Encounter (Signed)
plz phone in. 

## 2014-03-11 NOTE — Telephone Encounter (Signed)
Rx's called in as directed.  

## 2014-04-14 ENCOUNTER — Other Ambulatory Visit: Payer: Self-pay

## 2014-04-14 DIAGNOSIS — R52 Pain, unspecified: Secondary | ICD-10-CM

## 2014-04-14 MED ORDER — FENTANYL 50 MCG/HR TD PT72
50.0000 ug | MEDICATED_PATCH | TRANSDERMAL | Status: DC
Start: 2014-04-14 — End: 2014-05-21

## 2014-04-14 NOTE — Telephone Encounter (Signed)
Printed and placed in [name]'s box. 

## 2014-04-14 NOTE — Telephone Encounter (Signed)
Pt request rx fentanyl patch. Call when ready for pick up.

## 2014-04-14 NOTE — Telephone Encounter (Signed)
Message left notifying patient and Rx placed up front for pick up. 

## 2014-04-28 ENCOUNTER — Other Ambulatory Visit: Payer: Self-pay | Admitting: Family Medicine

## 2014-04-29 NOTE — Telephone Encounter (Signed)
plz phone in. 

## 2014-04-29 NOTE — Telephone Encounter (Signed)
Rx called in as directed.   

## 2014-05-04 ENCOUNTER — Other Ambulatory Visit: Payer: Self-pay | Admitting: Family Medicine

## 2014-05-04 NOTE — Telephone Encounter (Signed)
plz phone in. 

## 2014-05-04 NOTE — Telephone Encounter (Signed)
Last filled 03/11/14

## 2014-05-05 ENCOUNTER — Telehealth: Payer: Self-pay | Admitting: Family Medicine

## 2014-05-05 NOTE — Telephone Encounter (Signed)
Rx called in as directed.   

## 2014-05-06 NOTE — Telephone Encounter (Signed)
Pharmacy notified.

## 2014-05-06 NOTE — Telephone Encounter (Signed)
Rx called in as directed.   

## 2014-05-06 NOTE — Telephone Encounter (Signed)
Will with [facility] left v/m; [facility]am but pt just got 30 day refill for clonazepam on 04/29/14. Will wants to know if should fill this clonazepam # 60 early, put on hold or d/c prescription. Will request cb.

## 2014-05-06 NOTE — Telephone Encounter (Signed)
plz put on hold for when next refill due.

## 2014-05-06 NOTE — Telephone Encounter (Signed)
plz phone in. 

## 2014-05-13 ENCOUNTER — Other Ambulatory Visit: Payer: Self-pay | Admitting: Family Medicine

## 2014-05-13 DIAGNOSIS — E538 Deficiency of other specified B group vitamins: Secondary | ICD-10-CM

## 2014-05-13 DIAGNOSIS — E785 Hyperlipidemia, unspecified: Secondary | ICD-10-CM

## 2014-05-13 DIAGNOSIS — M810 Age-related osteoporosis without current pathological fracture: Secondary | ICD-10-CM

## 2014-05-14 ENCOUNTER — Other Ambulatory Visit: Payer: Medicare Other

## 2014-05-15 ENCOUNTER — Other Ambulatory Visit (INDEPENDENT_AMBULATORY_CARE_PROVIDER_SITE_OTHER): Payer: Medicare Other

## 2014-05-15 DIAGNOSIS — E538 Deficiency of other specified B group vitamins: Secondary | ICD-10-CM

## 2014-05-15 DIAGNOSIS — E785 Hyperlipidemia, unspecified: Secondary | ICD-10-CM

## 2014-05-15 LAB — BASIC METABOLIC PANEL
BUN: 19 mg/dL (ref 6–23)
CALCIUM: 9.8 mg/dL (ref 8.4–10.5)
CO2: 26 mEq/L (ref 19–32)
CREATININE: 1 mg/dL (ref 0.4–1.2)
Chloride: 103 mEq/L (ref 96–112)
GFR: 61.82 mL/min (ref >60.00)
Glucose, Bld: 106 mg/dL — ABNORMAL HIGH (ref 70–99)
Potassium: 3.8 mEq/L (ref 3.5–5.1)
Sodium: 138 mEq/L (ref 135–145)

## 2014-05-15 LAB — LIPID PANEL
CHOLESTEROL: 191 mg/dL (ref 0–200)
HDL: 81.8 mg/dL (ref >39.00)
LDL Cholesterol: 91 mg/dL (ref 0–99)
NonHDL: 109.2
TRIGLYCERIDES: 89 mg/dL (ref 0.0–149.0)
Total CHOL/HDL Ratio: 2
VLDL: 17.8 mg/dL (ref 0.0–40.0)

## 2014-05-15 LAB — VITAMIN B12: Vitamin B-12: 251 pg/mL (ref 211–911)

## 2014-05-21 ENCOUNTER — Encounter: Payer: Self-pay | Admitting: Family Medicine

## 2014-05-21 ENCOUNTER — Ambulatory Visit (INDEPENDENT_AMBULATORY_CARE_PROVIDER_SITE_OTHER): Payer: Medicare Other | Admitting: Family Medicine

## 2014-05-21 VITALS — BP 116/64 | HR 85 | Temp 97.9°F | Ht 64.75 in | Wt 124.0 lb

## 2014-05-21 DIAGNOSIS — E785 Hyperlipidemia, unspecified: Secondary | ICD-10-CM

## 2014-05-21 DIAGNOSIS — M810 Age-related osteoporosis without current pathological fracture: Secondary | ICD-10-CM

## 2014-05-21 DIAGNOSIS — R52 Pain, unspecified: Secondary | ICD-10-CM

## 2014-05-21 DIAGNOSIS — Z0001 Encounter for general adult medical examination with abnormal findings: Secondary | ICD-10-CM

## 2014-05-21 DIAGNOSIS — Z23 Encounter for immunization: Secondary | ICD-10-CM

## 2014-05-21 DIAGNOSIS — R21 Rash and other nonspecific skin eruption: Secondary | ICD-10-CM

## 2014-05-21 DIAGNOSIS — R251 Tremor, unspecified: Secondary | ICD-10-CM

## 2014-05-21 DIAGNOSIS — Z0000 Encounter for general adult medical examination without abnormal findings: Secondary | ICD-10-CM | POA: Insufficient documentation

## 2014-05-21 DIAGNOSIS — E538 Deficiency of other specified B group vitamins: Secondary | ICD-10-CM

## 2014-05-21 HISTORY — DX: Encounter for general adult medical examination with abnormal findings: Z00.01

## 2014-05-21 MED ORDER — CYANOCOBALAMIN 1000 MCG/ML IJ SOLN
1000.0000 ug | Freq: Once | INTRAMUSCULAR | Status: AC
  Administered 2014-05-21: 1000 ug via INTRAMUSCULAR

## 2014-05-21 MED ORDER — FENTANYL 50 MCG/HR TD PT72: 50.0000 ug | MEDICATED_PATCH | TRANSDERMAL | Status: DC

## 2014-05-21 NOTE — Progress Notes (Signed)
Pre visit review using our clinic review tool, if applicable. No additional management support is needed unless otherwise documented below in the visit note. 

## 2014-05-21 NOTE — Assessment & Plan Note (Signed)
Chronic, stable off meds.  

## 2014-05-21 NOTE — Assessment & Plan Note (Signed)
Resting tremor limited to RUE, previously noted to have increased tone and cogwheeling of R wrist alone.  Slightly masked fascies, but no other parkinsonism sxs. Continue to monitor.

## 2014-05-21 NOTE — Assessment & Plan Note (Signed)
Continue reclast infusion. Restarted 06/2013. Will call us 06/2014 to reschedule. Intolerant of oral bisphosphonates 2/2 GI upset.

## 2014-05-21 NOTE — Assessment & Plan Note (Signed)
Rash has not returned despite being off cellcept.

## 2014-05-21 NOTE — Addendum Note (Signed)
Addended by: [name] on: 05/21/2014 12:20 PM   Modules accepted: Orders

## 2014-05-21 NOTE — Assessment & Plan Note (Signed)
Preventative protocols reviewed and updated unless pt declined. Discussed healthy diet and lifestyle.  

## 2014-05-21 NOTE — Assessment & Plan Note (Signed)
I have personally reviewed the Medicare Annual Wellness questionnaire and have noted 1. The patient's medical and social history 2. Their use of alcohol, tobacco or illicit drugs 3. Their current medications and supplements 4. The patient's functional ability including ADL's, fall risks, home safety risks and hearing or visual impairment. 5. Diet and physical activity 6. Evidence for depression or mood disorders The patients weight, height, BMI have been recorded in the chart.  Hearing and vision has been addressed. I have made referrals, counseling and provided education to the patient based review of the above and I have provided the pt with a written personalized care plan for preventive services. See scanned questionairre. Advanced directives discussed: packet provided  Reviewed preventative protocols and updated unless pt declined. prevnar today. Upcoming mammo.
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
## 2014-05-21 NOTE — Assessment & Plan Note (Signed)
Low normal B12 today - will increase shots to Q2 mo. B12 shot today.

## 2014-05-21 NOTE — Addendum Note (Signed)
Addended by: Tammi Sou on: 05/21/2014 12:43 PM   Modules accepted: Orders

## 2014-05-21 NOTE — Patient Instructions (Addendum)
prevnar today. B12 shot today. Call us at beginning of August to set up your next reclast infusion. Advanced directive packet provided today. Let's increase b12 shots to every 2 months. Good to see you today, call us with questions. Return as needed or in 6 months for follow up visit.

## 2014-05-24 ENCOUNTER — Other Ambulatory Visit: Payer: Self-pay | Admitting: Family Medicine

## 2014-06-08 ENCOUNTER — Ambulatory Visit (INDEPENDENT_AMBULATORY_CARE_PROVIDER_SITE_OTHER): Payer: Medicare Other | Admitting: Family Medicine

## 2014-06-08 ENCOUNTER — Encounter: Payer: Self-pay | Admitting: Family Medicine

## 2014-06-08 VITALS — BP 110/70 | HR 72 | Temp 97.9°F | Wt 121.8 lb

## 2014-06-08 DIAGNOSIS — N390 Urinary tract infection, site not specified: Secondary | ICD-10-CM

## 2014-06-08 DIAGNOSIS — R300 Dysuria: Secondary | ICD-10-CM

## 2014-06-08 LAB — POCT URINALYSIS DIPSTICK
Bilirubin, UA: NEGATIVE
GLUCOSE UA: NEGATIVE
Ketones, UA: NEGATIVE
NITRITE UA: POSITIVE
Protein, UA: NEGATIVE
SPEC GRAV UA: 1.01
Urobilinogen, UA: 0.2
pH, UA: 6

## 2014-06-08 MED ORDER — CEPHALEXIN 500 MG PO CAPS: 500.0000 mg | ORAL_CAPSULE | Freq: Two times a day (BID) | ORAL | Status: DC

## 2014-06-08 NOTE — Progress Notes (Signed)
BP 110/70  Pulse 72  Temp(Src) 97.9 F (36.6 C) (Oral)  Wt 121 lb 12 oz (55.225 kg)   CC: UTI? Subjective:    Patient ID: [name], female    DOB: [birth year], 68 y.o.   MRN: [id number]  HPI: [name] is a 68 y.o. female presenting on 06/08/2014 for Urinary Tract Infection   H/o chronic cystitis as well as chronic interstitial cystitis. 4d h/ feeling ill. Endorses upper back pain, lower abdominal pain, stronger smelling urine, frequency, urgency, dysuria.  No hematuria, fevers/chills, nausea.  Continued use of vaginal estrogen. Planning on seeing urologist Dr.Evans August 5th for f/u On trimethoprim 100mg  daily ppx.  Recently moved - son currently living with her.  Relevant past medical, surgical, family and social history reviewed and updated as indicated.  Allergies and medications reviewed and updated. Current Outpatient Prescriptions on File Prior to Visit  Medication Sig  . Calcium Carb-Cholecalciferol (CALCIUM + D3) 600-200 MG-UNIT TABS Take 2 tablets by mouth daily.  . cholecalciferol (VITAMIN D) 1000 UNITS tablet Take 1,000 Units by mouth daily.  . clonazePAM (KLONOPIN) 0.5 MG tablet TAKE 1 TABLET TWICE A DAY AS NEEDED  . conjugated estrogens (PREMARIN) vaginal cream Place 1 Applicatorful vaginally daily.  . cyanocobalamin 1000 MCG/ML injection Inject 1,000 mcg into the muscle every 8 (eight) weeks.   . fentaNYL (DURAGESIC - DOSED MCG/HR) 50 MCG/HR Place 1 patch (50 mcg total) onto the skin every 3 (three) days.  Marland Kitchen omeprazole (PRILOSEC) 40 MG capsule TAKE ONE CAPSULE BY MOUTH EVERY DAY  . pentosan polysulfate (ELMIRON) 100 MG capsule Take 200 mg by mouth 2 (two) times daily.  . polyethylene glycol (GLYCOLAX) packet Take 17 g by mouth as needed.    . senna (SENOKOT) 8.6 MG tablet Take 1 tablet by mouth daily.    . traMADol (ULTRAM) 50 MG tablet TAKE 1 TABLET TWICE A DAY AS NEEDED FOR PAIN  . triamcinolone cream (KENALOG) 0.5 % APPLY TOPICALLY 3 (THREE) TIMES  DAILY.  Marland Kitchen Zoledronic Acid (RECLAST IV) Inject into the vein. Yearly, 06/2013   No current facility-administered medications on file prior to visit.    Review of Systems Per HPI unless specifically indicated above    Objective:    BP 110/70  Pulse 72  Temp(Src) 97.9 F (36.6 C) (Oral)  Wt 121 lb 12 oz (55.225 kg)  Physical Exam  Nursing note and vitals reviewed. Constitutional: She appears well-developed and well-nourished. No distress.  Abdominal: Soft. Normal appearance and bowel sounds are normal. She exhibits no distension and no mass. There is no hepatosplenomegaly. There is tenderness in the right lower quadrant, suprapubic area and left lower quadrant. There is CVA tenderness. There is no rigidity, no rebound, no guarding and negative Murphy's sign.   Results for orders placed in visit on 06/08/14  POCT URINALYSIS DIPSTICK      Result Value Ref Range   Color, UA Yellow     Clarity, UA Cloudy     Glucose, UA Negative     Bilirubin, UA Negative     Ketones, UA Negative     Spec Grav, UA 1.010     Blood, UA 1(+)     pH, UA 6.0     Protein, UA Negative     Urobilinogen, UA 0.2     Nitrite, UA Positive     Leukocytes, UA large (3+)        Assessment & Plan:   Problem List Items Addressed This Visit  Recurrent UTI - Primary     Send UCx. Treat with keflex 500mg  bid. Update if sxs persist or fail to improve. H/o recurrent UTI on tmp 100mg  daily ppx. Has f/u scheduled with urology for next month.    Relevant Medications      trimethoprim (TRIMPEX) 100 MG tablet      cephALEXin (KEFLEX) capsule   Other Relevant Orders      Urine culture   Dysuria   Relevant Orders      POCT Urinalysis Dipstick (Completed)      Urine culture       Follow up plan: Return if symptoms worsen or fail to improve.

## 2014-06-08 NOTE — Progress Notes (Signed)
Pre visit review using our clinic review tool, if applicable. No additional management support is needed unless otherwise documented below in the visit note. 

## 2014-06-08 NOTE — Assessment & Plan Note (Signed)
Send UCx. Treat with keflex 500mg  bid. Update if sxs persist or fail to improve. H/o recurrent UTI on tmp 100mg  daily ppx. Has f/u scheduled with urology for next month.

## 2014-06-08 NOTE — Patient Instructions (Signed)
You have recurrent UTI - treat with keflex course for next 7 days. Hold trimethoprim while on this medication. Push fluids and rest. May use tylenol for discomfort as needed Let us know if symptoms recur or fail to improve.

## 2014-06-10 LAB — URINE CULTURE

## 2014-06-23 ENCOUNTER — Other Ambulatory Visit: Payer: Self-pay

## 2014-06-23 DIAGNOSIS — R52 Pain, unspecified: Secondary | ICD-10-CM

## 2014-06-23 MED ORDER — FENTANYL 50 MCG/HR TD PT72: 50.0000 ug | MEDICATED_PATCH | TRANSDERMAL | Status: DC

## 2014-06-23 NOTE — Telephone Encounter (Signed)
Pt left message requesting rx fentanyl. Call when ready for pick up.

## 2014-06-23 NOTE — Telephone Encounter (Signed)
Printed and placed in [name]'s box. 

## 2014-06-24 ENCOUNTER — Encounter: Payer: Self-pay | Admitting: Family Medicine

## 2014-06-24 NOTE — Telephone Encounter (Signed)
Message left notifying patient. Rx placed up front for pick up. 

## 2014-06-25 ENCOUNTER — Other Ambulatory Visit: Payer: Self-pay | Admitting: Family Medicine

## 2014-06-25 NOTE — Telephone Encounter (Signed)
Ok to refill 

## 2014-06-25 NOTE — Telephone Encounter (Signed)
Rx's called in as directed.  

## 2014-06-25 NOTE — Telephone Encounter (Signed)
plz phone in. 

## 2014-07-06 ENCOUNTER — Telehealth: Payer: Self-pay | Admitting: Family Medicine

## 2014-07-06 NOTE — Telephone Encounter (Signed)
Pt called wanted to schedule her appointment To go to the hospital to get iv for ostoprosis pt wasn't for sure the name of the med

## 2014-07-07 ENCOUNTER — Telehealth: Payer: Self-pay | Admitting: Family Medicine

## 2014-07-07 NOTE — Telephone Encounter (Signed)
Patient called wanting her Reclast order put in  For [facility].

## 2014-07-08 NOTE — Telephone Encounter (Signed)
plz schedule reclast IV infusion 5mg  at short stay for after 07/14/2014. Osteoporosis 733.00

## 2014-07-08 NOTE — Telephone Encounter (Signed)
Order in your IN box for completion. 

## 2014-07-09 NOTE — Telephone Encounter (Signed)
Signed and placed in [name]'s box. 

## 2014-07-09 NOTE — Telephone Encounter (Signed)
Order faxed. Patient notified and will call to schedule appointment.

## 2014-07-13 ENCOUNTER — Telehealth: Payer: Self-pay | Admitting: *Deleted

## 2014-07-13 DIAGNOSIS — M810 Age-related osteoporosis without current pathological fracture: Secondary | ICD-10-CM

## 2014-07-13 NOTE — Telephone Encounter (Signed)
Noted  

## 2014-07-13 NOTE — Telephone Encounter (Signed)
FYI- Per Laverne at [facility] needs more recent labs for Reclast infusion. Labs ordered and appt scheduled. Will Probation officer new order once new results come back and patient will schedule appt.

## 2014-07-21 ENCOUNTER — Ambulatory Visit (INDEPENDENT_AMBULATORY_CARE_PROVIDER_SITE_OTHER): Payer: Medicare Other | Admitting: *Deleted

## 2014-07-21 ENCOUNTER — Other Ambulatory Visit: Payer: Self-pay | Admitting: *Deleted

## 2014-07-21 ENCOUNTER — Other Ambulatory Visit (INDEPENDENT_AMBULATORY_CARE_PROVIDER_SITE_OTHER): Payer: 59

## 2014-07-21 DIAGNOSIS — R52 Pain, unspecified: Secondary | ICD-10-CM

## 2014-07-21 DIAGNOSIS — E538 Deficiency of other specified B group vitamins: Secondary | ICD-10-CM

## 2014-07-21 DIAGNOSIS — M810 Age-related osteoporosis without current pathological fracture: Secondary | ICD-10-CM

## 2014-07-21 LAB — COMPREHENSIVE METABOLIC PANEL
ALT: 13 U/L (ref 0–35)
AST: 19 U/L (ref 0–37)
Albumin: 3.6 g/dL (ref 3.5–5.2)
Alkaline Phosphatase: 51 U/L (ref 39–117)
BUN: 11 mg/dL (ref 6–23)
CO2: 29 mEq/L (ref 19–32)
CREATININE: 0.8 mg/dL (ref 0.4–1.2)
Calcium: 9.6 mg/dL (ref 8.4–10.5)
Chloride: 102 mEq/L (ref 96–112)
GFR: 75.17 mL/min (ref >60.00)
Glucose, Bld: 86 mg/dL (ref 70–99)
POTASSIUM: 4.5 meq/L (ref 3.5–5.1)
Sodium: 139 mEq/L (ref 135–145)
Total Bilirubin: 0.4 mg/dL (ref 0.2–1.2)
Total Protein: 6.6 g/dL (ref 6.0–8.3)

## 2014-07-21 MED ORDER — FENTANYL 50 MCG/HR TD PT72: 50.0000 ug | MEDICATED_PATCH | TRANSDERMAL | Status: DC

## 2014-07-21 MED ORDER — CYANOCOBALAMIN 1000 MCG/ML IJ SOLN
1000.0000 ug | Freq: Once | INTRAMUSCULAR | Status: AC
  Administered 2014-07-21: 1000 ug via INTRAMUSCULAR

## 2014-07-21 NOTE — Telephone Encounter (Signed)
Printed and placed in [name]'s box. 

## 2014-07-21 NOTE — Telephone Encounter (Signed)
Patient notified and Rx placed up front for pick up. 

## 2014-07-21 NOTE — Telephone Encounter (Signed)
Pt was here for labs, b12 shot, requesting fentanyl rx. Pt request call when ready. Next f/u is 11/24/14.

## 2014-07-27 ENCOUNTER — Other Ambulatory Visit: Payer: Self-pay

## 2014-07-27 DIAGNOSIS — Z1231 Encounter for screening mammogram for malignant neoplasm of breast: Secondary | ICD-10-CM

## 2014-07-30 ENCOUNTER — Other Ambulatory Visit (HOSPITAL_COMMUNITY): Payer: Self-pay | Admitting: *Deleted

## 2014-07-31 ENCOUNTER — Ambulatory Visit (HOSPITAL_COMMUNITY)
Admission: RE | Admit: 2014-07-31 | Discharge: 2014-07-31 | Disposition: A | Discharge status: Home / Self Care | Payer: Medicare Other | Source: Ambulatory Visit | Attending: Family Medicine | Admitting: Family Medicine

## 2014-07-31 DIAGNOSIS — M810 Age-related osteoporosis without current pathological fracture: Secondary | ICD-10-CM | POA: Diagnosis present

## 2014-07-31 MED ORDER — ZOLEDRONIC ACID 5 MG/100ML IV SOLN: 5.0000 mg | Freq: Once | INTRAVENOUS | Status: DC

## 2014-07-31 MED ORDER — ZOLEDRONIC ACID 5 MG/100ML IV SOLN
INTRAVENOUS | Status: AC
  Administered 2014-07-31: 5 mg
  Filled 2014-07-31: qty 100

## 2014-08-04 ENCOUNTER — Ambulatory Visit
Admission: RE | Admit: 2014-08-04 | Discharge: 2014-08-04 | Disposition: A | Discharge status: Home / Self Care | Payer: 59 | Source: Ambulatory Visit

## 2014-08-04 DIAGNOSIS — Z1231 Encounter for screening mammogram for malignant neoplasm of breast: Secondary | ICD-10-CM

## 2014-08-05 LAB — HM MAMMOGRAPHY: HM MAMMO: NORMAL

## 2014-08-06 ENCOUNTER — Encounter: Payer: Self-pay | Admitting: *Deleted

## 2014-08-13 ENCOUNTER — Ambulatory Visit (INDEPENDENT_AMBULATORY_CARE_PROVIDER_SITE_OTHER): Payer: Medicare Other | Admitting: Family Medicine

## 2014-08-13 ENCOUNTER — Encounter: Payer: Self-pay | Admitting: Family Medicine

## 2014-08-13 VITALS — BP 114/60 | HR 74 | Temp 98.3°F | Wt 119.2 lb

## 2014-08-13 DIAGNOSIS — N390 Urinary tract infection, site not specified: Secondary | ICD-10-CM

## 2014-08-13 DIAGNOSIS — F32A Depression, unspecified: Secondary | ICD-10-CM

## 2014-08-13 DIAGNOSIS — F3289 Other specified depressive episodes: Secondary | ICD-10-CM

## 2014-08-13 DIAGNOSIS — F329 Major depressive disorder, single episode, unspecified: Secondary | ICD-10-CM

## 2014-08-13 DIAGNOSIS — M79601 Pain in right arm: Secondary | ICD-10-CM

## 2014-08-13 DIAGNOSIS — R300 Dysuria: Secondary | ICD-10-CM

## 2014-08-13 DIAGNOSIS — M79609 Pain in unspecified limb: Secondary | ICD-10-CM

## 2014-08-13 DIAGNOSIS — IMO0001 Reserved for inherently not codable concepts without codable children: Secondary | ICD-10-CM

## 2014-08-13 DIAGNOSIS — Z23 Encounter for immunization: Secondary | ICD-10-CM

## 2014-08-13 LAB — POCT URINALYSIS DIPSTICK
BILIRUBIN UA: NEGATIVE
Glucose, UA: NEGATIVE
KETONES UA: NEGATIVE
Nitrite, UA: POSITIVE
Protein, UA: NEGATIVE
Spec Grav, UA: 1.01
Urobilinogen, UA: 0.2
pH, UA: 7

## 2014-08-13 MED ORDER — DULOXETINE HCL 20 MG PO CPEP: 20.0000 mg | ORAL_CAPSULE | Freq: Every day | ORAL | Status: DC

## 2014-08-13 NOTE — Assessment & Plan Note (Signed)
Thought 2/2 RSD. No red flags today. Will treat with cymbalta 20mg  with slow taper up as tolerated.

## 2014-08-13 NOTE — Progress Notes (Signed)
Pre visit review using our clinic review tool, if applicable. No additional management support is needed unless otherwise documented below in the visit note. 

## 2014-08-13 NOTE — Assessment & Plan Note (Signed)
Start cymbalta and assess accordingly

## 2014-08-13 NOTE — Assessment & Plan Note (Signed)
Describes depression - will start cymbalta 20mg  daily with slow titration as tolerated. Discussed caution with tramadol + cymbalta. Pt agrees with plan.

## 2014-08-13 NOTE — Addendum Note (Signed)
Addended by: Tammi Sou on: 08/13/2014 03:12 PM   Modules accepted: Orders

## 2014-08-13 NOTE — Progress Notes (Signed)
BP 114/60  Pulse 74  Temp(Src) 98.3 F (36.8 C) (Oral)  Wt 119 lb 4 oz (54.091 kg)  SpO2 97%   CC: f/u visit  Subjective:    Patient ID: [name], female    DOB: [birth year], 68 y.o.   MRN: [id number]  HPI: [name] is a 68 y.o. female presenting on 08/13/2014 for Follow-up, Dysuria and Depression   R arm started bothering her more recently without inciting trauma/injury. 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 Worse pain with writing - fingers cramp up.  Feels she is getting more depressed. No appetite, doesn't want to be alone or around other people. Husband died last year around this time. Upcoming wedding anniversary. Some insomnia. Energy level down, concentration down. Hides feelings from children. Significant family stress.  H/o chronic cystitis as well as chronic interstitial cystitis. Saw urologist Dr.Evans August 5th for f/u - had infection - placed on another 2 wks of Keflex. Forgot to restart tmp 100mg  ppx after finished keflex. Urine sxs never fully went away - increased frequency and urgency, odor, dysuria. No hematuria, fevers/chills, nausea/vomiting.  On klonopin 0.5mg  nightly with extra tablet during day as needed.  Wt Readings from Last 3 Encounters:  08/13/14 119 lb 4 oz (54.091 kg)  07/31/14 125 lb (56.7 kg)  06/08/14 121 lb 12 oz (55.225 kg)   Body mass index is 19.54 kg/(m^2).  Relevant past medical, surgical, family and social history reviewed and updated as indicated.  Allergies and medications reviewed and updated. Current Outpatient Prescriptions on File Prior to Visit  Medication Sig  . Calcium Carb-Cholecalciferol (CALCIUM + D3) 600-200 MG-UNIT TABS Take 2 tablets by mouth daily.  . cephALEXin (KEFLEX) 500 MG capsule Take 1 capsule (500 mg total) by mouth 2 (two) times daily.  . cholecalciferol (VITAMIN D) 1000 UNITS tablet Take 1,000 Units by mouth daily.  . clonazePAM (KLONOPIN) 0.5 MG tablet TAKE 1 TABLET BY MOUTH TWICE A DAY AS NEEDED  . conjugated  estrogens (PREMARIN) vaginal cream Place 1 Applicatorful vaginally daily.  . cyanocobalamin 1000 MCG/ML injection Inject 1,000 mcg into the muscle every 8 (eight) weeks.   . fentaNYL (DURAGESIC - DOSED MCG/HR) 50 MCG/HR Place 1 patch (50 mcg total) onto the skin every 3 (three) days.  Marland Kitchen omeprazole (PRILOSEC) 40 MG capsule TAKE ONE CAPSULE BY MOUTH EVERY DAY  . pentosan polysulfate (ELMIRON) 100 MG capsule Take 200 mg by mouth 2 (two) times daily.  . polyethylene glycol (GLYCOLAX) packet Take 17 g by mouth as needed.    . senna (SENOKOT) 8.6 MG tablet Take 1 tablet by mouth daily.    . traMADol (ULTRAM) 50 MG tablet TAKE 1 TABLET TWICE A DAY AS NEEDED FOR PAIN  . triamcinolone cream (KENALOG) 0.5 % APPLY TOPICALLY 3 (THREE) TIMES DAILY.  Marland Kitchen trimethoprim (TRIMPEX) 100 MG tablet Take 100 mg by mouth daily. UTI ppx  . Zoledronic Acid (RECLAST IV) Inject into the vein. Yearly, 06/2013   No current facility-administered medications on file prior to visit.   Past Medical History  Diagnosis Date  . B12 DEFICIENCY 05/03/2007  . FIBROMYALGIA 05/03/2007  . ANEMIA-NOS 09/25/2007  . HYPERLIPIDEMIA 12/19/2007  . HYPOTENSION, ORTHOSTATIC 12/06/2008  . OSTEOPOROSIS 08/2009    bisphosphonate on hold 2/2 dysphagia, on reclast done 06/2013  . 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 LUPUS 02/08/2010  . GERD 02/22/2010  . Interstitial cystitis     Ottelin  . CAD (coronary  artery disease) 01/2010    MI, Nishan  . History of CHF (congestive heart failure) 01/2010  . Cardiomyopathy 01/2010  . History of colon polyps 2004  . Takotsubo cardiomyopathy 2008    due to E coli urosepsis  . Glaucoma 02/2013    [facility]    Review of Systems Per HPI unless specifically indicated above    Objective:    BP 114/60  Pulse 74  Temp(Src) 98.3 F (36.8 C) (Oral)  Wt 119 lb 4 oz (54.091 kg)  SpO2 97%  Physical Exam  Nursing note and vitals reviewed. Constitutional: She appears  well-developed and well-nourished. No distress.  HENT:  Mouth/Throat: Oropharynx is clear and moist. No oropharyngeal exudate.  Eyes: Conjunctivae and EOM are normal. Pupils are equal, round, and reactive to light. No scleral icterus.  Cardiovascular: Normal rate, regular rhythm, normal heart sounds and intact distal pulses.   No murmur heard. Pulmonary/Chest: Effort normal and breath sounds normal. No respiratory distress. She has no wheezes. She has no rales.  Abdominal: Soft. Normal appearance and bowel sounds are normal. She exhibits no distension and no mass. There is generalized tenderness (moderate). There is guarding. There is no rigidity, no rebound, no CVA tenderness and negative Murphy's sign.  Musculoskeletal: She exhibits no edema.  FROM bilat elbows No epicondyle pain  Skin: Skin is warm and dry. No rash noted.  Psychiatric: She has a normal mood and affect.   Results for orders placed in visit on 08/06/14  HM MAMMOGRAPHY      Result Value Ref Range   HM Mammogram Normal-Birads 1-Repeat 1 year        Assessment & Plan:   Problem List Items Addressed This Visit   Right arm pain     Thought 2/2 RSD. No red flags today. Will treat with cymbalta 20mg  with slow taper up as tolerated.    Recurrent UTI     Check UA, treat accordingly. Recently finished keflex 500mg  bid 2 wk course mid last month, didn't restart tmp 100mg  daily ppx    FIBROMYALGIA     Start cymbalta and assess accordingly    Dysuria - Primary   Relevant Orders      POCT urinalysis dipstick   Depression     Describes depression - will start cymbalta 20mg  daily with slow titration as tolerated. Discussed caution with tramadol + cymbalta. Pt agrees with plan.    Relevant Medications      DULoxetine (CYMBALTA) DR capsule       Follow up plan: No Follow-up on file.

## 2014-08-13 NOTE — Patient Instructions (Signed)
Flu shot today. Try cymbalta 20mg  daily for mood and pain. Look into counseling.  Urine checked today - we will call you with results. Return to see me in 1 month.

## 2014-08-13 NOTE — Assessment & Plan Note (Signed)
Check UA, treat accordingly. Recently finished keflex 500mg  bid 2 wk course mid last month, didn't restart tmp 100mg  daily ppx

## 2014-08-14 ENCOUNTER — Other Ambulatory Visit: Payer: Self-pay | Admitting: Family Medicine

## 2014-08-14 DIAGNOSIS — R300 Dysuria: Secondary | ICD-10-CM

## 2014-08-14 MED ORDER — CEPHALEXIN 500 MG PO CAPS: 500.0000 mg | ORAL_CAPSULE | Freq: Two times a day (BID) | ORAL | Status: DC

## 2014-08-14 NOTE — Addendum Note (Signed)
Addended by: Ellamae Sia on: 08/14/2014 08:10 AM   Modules accepted: Orders

## 2014-08-17 LAB — URINE CULTURE

## 2014-08-24 ENCOUNTER — Other Ambulatory Visit: Payer: Self-pay | Admitting: *Deleted

## 2014-08-24 DIAGNOSIS — R52 Pain, unspecified: Secondary | ICD-10-CM

## 2014-08-24 MED ORDER — FENTANYL 50 MCG/HR TD PT72: 50.0000 ug | MEDICATED_PATCH | TRANSDERMAL | Status: DC

## 2014-08-24 NOTE — Telephone Encounter (Signed)
Printed and placed in [name]'s box. 

## 2014-08-24 NOTE — Telephone Encounter (Signed)
Pt called requesting Fentanyl refill. She last had an acute ov on 08/13/14, and has a 1 mo f/u scheduled for 09/14/14.

## 2014-08-25 ENCOUNTER — Other Ambulatory Visit: Payer: Self-pay | Admitting: Family Medicine

## 2014-08-26 NOTE — Telephone Encounter (Signed)
plz phone in. 

## 2014-08-26 NOTE — Telephone Encounter (Signed)
I spoke with pt and notified her that rx was ready, and will be at the front desk.

## 2014-08-27 NOTE — Telephone Encounter (Signed)
Rx called to pharmacy as instructed. 

## 2014-09-14 ENCOUNTER — Encounter: Payer: Self-pay | Admitting: Family Medicine

## 2014-09-14 ENCOUNTER — Ambulatory Visit: Payer: Medicare Other | Admitting: Family Medicine

## 2014-09-15 ENCOUNTER — Ambulatory Visit (INDEPENDENT_AMBULATORY_CARE_PROVIDER_SITE_OTHER): Payer: Medicare Other | Admitting: Family Medicine

## 2014-09-15 ENCOUNTER — Encounter: Payer: Self-pay | Admitting: Family Medicine

## 2014-09-15 VITALS — BP 110/60 | HR 80 | Temp 98.1°F | Wt 117.2 lb

## 2014-09-15 DIAGNOSIS — M79671 Pain in right foot: Secondary | ICD-10-CM

## 2014-09-15 DIAGNOSIS — N390 Urinary tract infection, site not specified: Secondary | ICD-10-CM

## 2014-09-15 DIAGNOSIS — F32A Depression, unspecified: Secondary | ICD-10-CM

## 2014-09-15 DIAGNOSIS — F329 Major depressive disorder, single episode, unspecified: Secondary | ICD-10-CM

## 2014-09-15 DIAGNOSIS — G9050 Complex regional pain syndrome I, unspecified: Secondary | ICD-10-CM

## 2014-09-15 MED ORDER — GABAPENTIN 100 MG PO CAPS: 100.0000 mg | ORAL_CAPSULE | Freq: Two times a day (BID) | ORAL | Status: DC

## 2014-09-15 NOTE — Assessment & Plan Note (Signed)
FM flare vs RSD flare (pt states this is how RSD presented in past). No indication of fracture, plantar fasciitis, infection. Continue current RSD regimen of fentanyl. Start gabapentin 100mg  nightly for 1 wk then increase to bid dosing. Intolerant to pregabalin, cymbalta worsened depression.

## 2014-09-15 NOTE — Assessment & Plan Note (Signed)
Did not tolerate cymbalta. Started Patent examiner. Overall feeling stable. No additional meds needed today. Continue klonopin 0.5mg  bid prn.

## 2014-09-15 NOTE — Patient Instructions (Signed)
Try gabapentin 100mg  nightly for RSD pain.  May increase to twice daily after 1 week of use.  No other changes to medicines for now.

## 2014-09-15 NOTE — Assessment & Plan Note (Signed)
Pending f/u with urology for further evaluation of recurrent bladder infections. Currently on macrobid 100mg  daily.

## 2014-09-15 NOTE — Assessment & Plan Note (Signed)
See below

## 2014-09-15 NOTE — Progress Notes (Signed)
BP 110/60  Pulse 80  Temp(Src) 98.1 F (36.7 C) (Oral)  Wt 117 lb 4 oz (53.184 kg)   CC: 1 mo f/u  Subjective:    Patient ID: [name], female    DOB: 19-Sep-1946, 68 y.o.   MRN: [id number]  HPI: [name] is a 68 y.o. female presenting on 09/15/2014 for Follow-up and Foot Pain   Seen here last month with worsening depression - started on cymbalta 20mg  daily. Took it for 3 days and actually worsened her depression. She started seeing counselor Pattricia Boss). Still struggles with husband's passing. Sunday marked 1 year anniversary of husband's death.   Upcoming urological surgery Nov 6th (Dr. Amalia Hailey), on nitrofurantoin 100mg  daily until then.  R foot pain at 1st MCP with radiation up medial foot, some pain dorsal midfoot - worse first few steps in the morning. Worse with prolonged walking. Tramadol helps pain. Denies inciting trauma/injury. Describes tingling numbness as well as dull achey pain. 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  Wt Readings from Last 3 Encounters:  09/15/14 117 lb 4 oz (53.184 kg)  08/13/14 119 lb 4 oz (54.091 kg)  07/31/14 125 lb (56.7 kg)   Body mass index is 19.21 kg/(m^2).   Relevant past medical, surgical, family and social history reviewed and updated as indicated.  Allergies and medications reviewed and updated. Current Outpatient Prescriptions on File Prior to Visit  Medication Sig  . Calcium Carb-Cholecalciferol (CALCIUM + D3) 600-200 MG-UNIT TABS Take 2 tablets by mouth daily.  . cholecalciferol (VITAMIN D) 1000 UNITS tablet Take 1,000 Units by mouth daily.  . clonazePAM (KLONOPIN) 0.5 MG tablet TAKE 1 TABLET BY MOUTH TWICE A DAY AS NEEDED  . conjugated estrogens (PREMARIN) vaginal cream Place 1 Applicatorful vaginally daily.  . cyanocobalamin 1000 MCG/ML injection Inject 1,000 mcg into the muscle every 8 (eight) weeks.   . fentaNYL (DURAGESIC - DOSED MCG/HR) 50 MCG/HR Place 1 patch (50 mcg total) onto the skin every 3 (three) days.  Marland Kitchen omeprazole  (PRILOSEC) 40 MG capsule TAKE ONE CAPSULE BY MOUTH EVERY DAY  . pentosan polysulfate (ELMIRON) 100 MG capsule Take 200 mg by mouth 2 (two) times daily.  . polyethylene glycol (GLYCOLAX) packet Take 17 g by mouth as needed.    . senna (SENOKOT) 8.6 MG tablet Take 1 tablet by mouth daily.    . traMADol (ULTRAM) 50 MG tablet TAKE 1 TABLET TWICE A DAY AS NEEDED FOR PAIN  . triamcinolone cream (KENALOG) 0.5 % APPLY TOPICALLY 3 (THREE) TIMES DAILY.  Marland Kitchen Zoledronic Acid (RECLAST IV) Inject into the vein. Yearly, 06/2013   No current facility-administered medications on file prior to visit.    Review of Systems Per HPI unless specifically indicated above    Objective:    BP 110/60  Pulse 80  Temp(Src) 98.1 F (36.7 C) (Oral)  Wt 117 lb 4 oz (53.184 kg)  Physical Exam  Nursing note and vitals reviewed. Constitutional: She appears well-developed and well-nourished. No distress.  Musculoskeletal: She exhibits no edema.  2+ DP on right Tender to light touch throughout foot predominantly medial foot and at 1st MCP. No significant pain with axial loading at great toe. Main discomfort is with medial rotation of toe at MCP. Mild heel pain as well.       Assessment & Plan:   Problem List Items Addressed This Visit   Right foot pain - Primary     FM flare vs RSD flare (pt states this is how  RSD presented in past). No indication of fracture, plantar fasciitis, infection. Continue current RSD regimen of fentanyl. Start gabapentin 100mg  nightly for 1 wk then increase to bid dosing. Intolerant to pregabalin, cymbalta worsened depression.    Reflex sympathetic dystrophy     See below    Relevant Medications      gabapentin (NEURONTIN) capsule   Recurrent UTI     Pending f/u with urology for further evaluation of recurrent bladder infections. Currently on macrobid 100mg  daily.    Relevant Medications      nitrofurantoin, macrocrystal-monohydrate, (MACROBID) 100 MG capsule   Depression      Did not tolerate cymbalta. Started Patent examiner. Overall feeling stable. No additional meds needed today. Continue klonopin 0.5mg  bid prn.        Follow up plan: Return as needed, for keep f/u appt.

## 2014-09-15 NOTE — Progress Notes (Signed)
Pre visit review using our clinic review tool, if applicable. No additional management support is needed unless otherwise documented below in the visit note. 

## 2014-09-22 ENCOUNTER — Other Ambulatory Visit: Payer: Self-pay

## 2014-09-22 DIAGNOSIS — R52 Pain, unspecified: Secondary | ICD-10-CM

## 2014-09-22 NOTE — Telephone Encounter (Signed)
Pt left v/m requesting rx fentanyl patch; call when ready for pick up. Pt has appt for b12 injection on 09/23/14 at 10 am and would like to pick up then if not call pt when ready for pick up.

## 2014-09-23 ENCOUNTER — Ambulatory Visit (INDEPENDENT_AMBULATORY_CARE_PROVIDER_SITE_OTHER): Payer: Medicare Other

## 2014-09-23 ENCOUNTER — Other Ambulatory Visit: Payer: Self-pay | Admitting: Family Medicine

## 2014-09-23 DIAGNOSIS — E538 Deficiency of other specified B group vitamins: Secondary | ICD-10-CM

## 2014-09-23 MED ORDER — CYANOCOBALAMIN 1000 MCG/ML IJ SOLN
1000.0000 ug | Freq: Once | INTRAMUSCULAR | Status: AC
  Administered 2014-09-23: 1000 ug via INTRAMUSCULAR

## 2014-09-23 MED ORDER — FENTANYL 50 MCG/HR TD PT72
50.0000 ug | MEDICATED_PATCH | TRANSDERMAL | Status: DC
Start: 2014-09-23 — End: 2014-10-21

## 2014-09-23 NOTE — Telephone Encounter (Signed)
Message left notifying patient and Rx placed up front for pick up. 

## 2014-09-23 NOTE — Telephone Encounter (Signed)
Rx's called in as directed.  

## 2014-09-23 NOTE — Telephone Encounter (Signed)
plz phone in. 

## 2014-09-23 NOTE — Telephone Encounter (Signed)
Ok to refill 

## 2014-09-23 NOTE — Telephone Encounter (Signed)
Printed and placed in [name]'s box. 

## 2014-10-05 ENCOUNTER — Telehealth: Payer: Self-pay | Admitting: *Deleted

## 2014-10-05 NOTE — Telephone Encounter (Signed)
Ok to do here - plz fax office note and urine culture results then may schedule nurse visit for rocephin 1gm IM. She may need to return to [facility] if Saturday is 5th day as we're closed here.

## 2014-10-05 NOTE — Telephone Encounter (Signed)
Tammy from [facility] called and said that patient had a urine culture done and bacteria is resistant to all oral abx. The doctor there wants patient to have Rocephin 1 gr QD x 5 days, but she lives so far away, they were wanting to see if she could come here for the injections. Tammy said she could order the medication for the patient from the pharmacy, so she could bring it with her if necessary. [phone]

## 2014-10-06 ENCOUNTER — Ambulatory Visit (INDEPENDENT_AMBULATORY_CARE_PROVIDER_SITE_OTHER): Payer: Medicare Other | Admitting: *Deleted

## 2014-10-06 DIAGNOSIS — N390 Urinary tract infection, site not specified: Secondary | ICD-10-CM

## 2014-10-06 DIAGNOSIS — A499 Bacterial infection, unspecified: Secondary | ICD-10-CM

## 2014-10-06 MED ORDER — CEFTRIAXONE SODIUM 1 G IJ SOLR
1.0000 g | Freq: Once | INTRAMUSCULAR | Status: AC
  Administered 2014-10-06: 1 g via INTRAMUSCULAR

## 2014-10-06 NOTE — Telephone Encounter (Signed)
If she got her first shot on Monday doesn't need Saturday shot. Otherwise would check to see if she can get Saturday rocephin at [facility].

## 2014-10-06 NOTE — Telephone Encounter (Signed)
Will need to go to [facility]. Please contact MD on duty. Thanks!

## 2014-10-06 NOTE — Telephone Encounter (Signed)
[name] notified and will notify patient. [facility] is closed Saturday as well. Could she possibly go to Saturday clinic? I can forward this to the MD that is working that day for a notification. I also don't mind meeting her here and giving it to her, but I don't know if I can with no MD here on site.

## 2014-10-07 ENCOUNTER — Ambulatory Visit (INDEPENDENT_AMBULATORY_CARE_PROVIDER_SITE_OTHER): Payer: Medicare Other | Admitting: *Deleted

## 2014-10-07 DIAGNOSIS — N390 Urinary tract infection, site not specified: Secondary | ICD-10-CM

## 2014-10-07 DIAGNOSIS — A499 Bacterial infection, unspecified: Secondary | ICD-10-CM

## 2014-10-07 MED ORDER — CEFTRIAXONE SODIUM 1 G IJ SOLR
1.0000 g | Freq: Once | INTRAMUSCULAR | Status: AC
  Administered 2014-10-07: 1 g via INTRAMUSCULAR

## 2014-10-08 ENCOUNTER — Ambulatory Visit (INDEPENDENT_AMBULATORY_CARE_PROVIDER_SITE_OTHER): Payer: Medicare Other | Admitting: *Deleted

## 2014-10-08 DIAGNOSIS — A499 Bacterial infection, unspecified: Secondary | ICD-10-CM

## 2014-10-08 DIAGNOSIS — N390 Urinary tract infection, site not specified: Secondary | ICD-10-CM

## 2014-10-08 MED ORDER — CEFTRIAXONE SODIUM 1 G IJ SOLR
1.0000 g | Freq: Once | INTRAMUSCULAR | Status: AC
  Administered 2014-10-08: 1 g via INTRAMUSCULAR

## 2014-10-08 NOTE — Telephone Encounter (Signed)
Spoke with Dr Damita Dunnings who is on call this Saturday. plz schedule pt for Saturday clinic nurse visit for final rocephin shot.  Pt will bring antibiotic with her. Records from [facility] Dr Amalia Hailey reviewed and sent for scanning - s/p cystourethroscopy with hydrodistension of bladder, instillation with phenazopyridine, and bilateral retrograde pyelography - culture from R renal pelvis grew klebsiella pneumoniae sensitive to rocephin and MRSA.  Did not receive actual order for rocephin IM x5d. Would also like urology input on MRSA found on UCx. ?colonization, contamination? Can we call Tammy back for f/u on these issues with Dr. Amalia Hailey?

## 2014-10-09 ENCOUNTER — Ambulatory Visit (INDEPENDENT_AMBULATORY_CARE_PROVIDER_SITE_OTHER): Payer: Medicare Other | Admitting: *Deleted

## 2014-10-09 DIAGNOSIS — N390 Urinary tract infection, site not specified: Secondary | ICD-10-CM

## 2014-10-09 DIAGNOSIS — A499 Bacterial infection, unspecified: Secondary | ICD-10-CM

## 2014-10-09 MED ORDER — CEFTRIAXONE SODIUM 1 G IJ SOLR
1.0000 g | Freq: Once | INTRAMUSCULAR | Status: AC
  Administered 2014-10-09: 1 g via INTRAMUSCULAR

## 2014-10-09 NOTE — Telephone Encounter (Signed)
Spoke with Tammy at Dr. Amalia Hailey office. She said that he more than likely will not do anything about the MRSA based on it's location. I asked to have him call you in reference to this. He is in clinic today and said she would send him the message. She will also fax over the order for the rocephin.

## 2014-10-09 NOTE — Telephone Encounter (Signed)
Thanks. Spoke with Dr Amalia Hailey - MRSA possible contaminant. Will treat klebsiella UTI. Last dose will be tomorrow at Saturday clinic.

## 2014-10-10 ENCOUNTER — Encounter: Payer: Self-pay | Admitting: Family Medicine

## 2014-10-10 ENCOUNTER — Ambulatory Visit (INDEPENDENT_AMBULATORY_CARE_PROVIDER_SITE_OTHER): Payer: Medicare Other | Admitting: Family Medicine

## 2014-10-10 VITALS — BP 100/64 | Temp 98.1°F | Wt 116.0 lb

## 2014-10-10 DIAGNOSIS — N390 Urinary tract infection, site not specified: Secondary | ICD-10-CM

## 2014-10-10 MED ORDER — CEFTRIAXONE SODIUM 1 G IJ SOLR
1.0000 g | INTRAMUSCULAR | Status: DC
  Administered 2014-10-10: 1 g via INTRAMUSCULAR

## 2014-10-10 NOTE — Progress Notes (Signed)
Her for RN visit.  Prev culture data reviewed and prev d/w PCP.  Tolerated injection w/o complication.   No dysuria now.   Meds, vitals, and allergies reviewed.   ROS: See HPI.  Otherwise, noncontributory.  nad Exam deferred.

## 2014-10-10 NOTE — Patient Instructions (Signed)
Take care . Notify Dr. Darnell Level if you have other concerns.

## 2014-10-10 NOTE — Assessment & Plan Note (Signed)
Rocephin today.  No dysuria.  We agreed that she'll notify PCP if any more sx. Routed to PCP as FYI.

## 2014-10-12 MED ORDER — CEFTRIAXONE SODIUM 1 G IJ SOLR
1.0000 g | INTRAMUSCULAR | Status: DC
  Administered 2014-10-14: 1 g via INTRAMUSCULAR

## 2014-10-12 NOTE — Addendum Note (Signed)
Addended by: Colleen Can on: 10/12/2014 02:19 PM   Modules accepted: Orders

## 2014-10-21 ENCOUNTER — Other Ambulatory Visit: Payer: Self-pay

## 2014-10-21 DIAGNOSIS — R52 Pain, unspecified: Secondary | ICD-10-CM

## 2014-10-21 NOTE — Telephone Encounter (Signed)
Pt left v/m requesting fentanyl rx. Call when ready for pick up. Pt would like to pick up on 10/23/14.

## 2014-10-23 MED ORDER — FENTANYL 50 MCG/HR TD PT72: 50.0000 ug | MEDICATED_PATCH | TRANSDERMAL | Status: DC

## 2014-10-23 NOTE — Telephone Encounter (Signed)
Printed and in kims'box  

## 2014-10-23 NOTE — Telephone Encounter (Signed)
Message left advising patient and Rx placed up front for pick up. 

## 2014-10-28 ENCOUNTER — Ambulatory Visit (INDEPENDENT_AMBULATORY_CARE_PROVIDER_SITE_OTHER): Payer: Medicare Other | Admitting: Family Medicine

## 2014-10-28 ENCOUNTER — Encounter: Payer: Self-pay | Admitting: Family Medicine

## 2014-10-28 VITALS — BP 110/64 | HR 84 | Temp 98.1°F | Wt 117.5 lb

## 2014-10-28 DIAGNOSIS — R8290 Unspecified abnormal findings in urine: Secondary | ICD-10-CM

## 2014-10-28 DIAGNOSIS — N390 Urinary tract infection, site not specified: Secondary | ICD-10-CM

## 2014-10-28 DIAGNOSIS — R300 Dysuria: Secondary | ICD-10-CM

## 2014-10-28 LAB — POCT URINALYSIS DIPSTICK
Bilirubin, UA: NEGATIVE
Glucose, UA: NEGATIVE
Ketones, UA: NEGATIVE
Leukocytes, UA: NEGATIVE
NITRITE UA: NEGATIVE
Protein, UA: NEGATIVE
RBC UA: NEGATIVE
Spec Grav, UA: 1.02
UROBILINOGEN UA: 0.2
pH, UA: 7

## 2014-10-28 NOTE — Progress Notes (Signed)
BP 110/64 mmHg  Pulse 84  Temp(Src) 98.1 F (36.7 C) (Oral)  Wt 117 lb 8 oz (53.298 kg)   CC: UTI?  Subjective:    Patient ID: [name], female    DOB: Aug 11, 1946, 67 y.o.   MRN: [id number]  HPI: [name] is a 68 y.o. female presenting on 10/28/2014 for Urinary Tract Infection   See recent phone notes and OV for recent details. Had not restarted nitrofurantoin 100mg  for UTI ppx yet. Recently treated for resistant klebsiella UTI with 5d CTX IM course (11/10-14/2015). Known recurrent cystitis, interstitial cystitis CYSTOSCOPY Date: 12/2013 abx treatment for recurrent cystitis. No stents in place. No nephrolithiasis.   5d h/o urgency, frequency, dysuria, bilateral flank pain, odor of urine. No hematuria, fevers/chills, abd pain. Some nausea initially  Was unable to get in to see Dr Amalia Hailey. Next appt is 11/18/2014.  Records from [facility] Dr Amalia Hailey reviewed and sent for scanning - s/p cystourethroscopy with hydrodistension of bladder, instillation with phenazopyridine, and bilateral retrograde pyelography - culture from R renal pelvis grew klebsiella pneumoniae sensitive to rocephin and MRSA (thought contaminant).  Relevant past medical, surgical, family and social history reviewed and updated as indicated. Interim medical history since our last visit reviewed. Allergies and medications reviewed and updated.  Current Outpatient Prescriptions on File Prior to Visit  Medication Sig  . Calcium Carb-Cholecalciferol (CALCIUM + D3) 600-200 MG-UNIT TABS Take 2 tablets by mouth daily.  . cholecalciferol (VITAMIN D) 1000 UNITS tablet Take 1,000 Units by mouth daily.  . clonazePAM (KLONOPIN) 0.5 MG tablet TAKE 1 TABLET BY MOUTH TWICE A DAY AS NEEDED. MUST LAST THROUGH 11/02/14  . conjugated estrogens (PREMARIN) vaginal cream Place 1 Applicatorful vaginally daily.  . cyanocobalamin 1000 MCG/ML injection Inject 1,000 mcg into the muscle every 8 (eight) weeks.   . fentaNYL  (DURAGESIC - DOSED MCG/HR) 50 MCG/HR Place 1 patch (50 mcg total) onto the skin every 3 (three) days.  Marland Kitchen gabapentin (NEURONTIN) 100 MG capsule Take 1 capsule (100 mg total) by mouth 2 (two) times daily. Start at 1 tablet nightly for 1 week  . latanoprost (XALATAN) 0.005 % ophthalmic solution Place 1 drop into both eyes at bedtime.  . nitrofurantoin, macrocrystal-monohydrate, (MACROBID) 100 MG capsule Take 100 mg by mouth daily.   Marland Kitchen omeprazole (PRILOSEC) 40 MG capsule TAKE ONE CAPSULE BY MOUTH EVERY DAY  . pentosan polysulfate (ELMIRON) 100 MG capsule Take 200 mg by mouth 2 (two) times daily.  . polyethylene glycol (GLYCOLAX) packet Take 17 g by mouth as needed.    . senna (SENOKOT) 8.6 MG tablet Take 1 tablet by mouth daily.    . traMADol (ULTRAM) 50 MG tablet TAKE 1 TABLET BY MOUTH TWICE A DAY AS NEEDED  . triamcinolone cream (KENALOG) 0.5 % APPLY TOPICALLY 3 (THREE) TIMES DAILY.  Marland Kitchen Zoledronic Acid (RECLAST IV) Inject into the vein. Yearly, 06/2013   No current facility-administered medications on file prior to visit.    Review of Systems Per HPI unless specifically indicated above     Objective:    BP 110/64 mmHg  Pulse 84  Temp(Src) 98.1 F (36.7 C) (Oral)  Wt 117 lb 8 oz (53.298 kg)  Wt Readings from Last 3 Encounters:  10/28/14 117 lb 8 oz (53.298 kg)  10/10/14 116 lb (52.617 kg)  09/15/14 117 lb 4 oz (53.184 kg)    Physical Exam  Constitutional: She appears well-developed and well-nourished. No distress.  HENT:  Mouth/Throat: Oropharynx is clear  and moist. No oropharyngeal exudate.  Abdominal: Soft. Normal appearance and bowel sounds are normal. She exhibits no distension and no mass. There is tenderness (diffuse discomfort throughout but not acute abdomen). There is CVA tenderness (chronically tender - ?FM related). There is no rigidity, no rebound, no guarding and negative Murphy's sign.  Skin: Skin is warm and dry. No rash noted.  Nursing note and vitals  reviewed.  Results for orders placed or performed in visit on 10/28/14  POCT Urinalysis Dipstick  Result Value Ref Range   Color, UA Yellow    Clarity, UA Cloudy    Glucose, UA Negative    Bilirubin, UA Negative    Ketones, UA Negative    Spec Grav, UA 1.020    Blood, UA Negative    pH, UA 7.0    Protein, UA Negative    Urobilinogen, UA 0.2    Nitrite, UA Negative    Leukocytes, UA Negative       Assessment & Plan:   Problem List Items Addressed This Visit    Recurrent UTI   Relevant Orders      Urine culture   Dysuria - Primary    Always with difficulty distinguishing between FM, interstitial cystitis, and UTI. UA today normal. However given her symptoms and complicated urological history will send urine culture today regardless. rec restart macrobid 100mg  daily for ppx, will await UCx prior to starting another abx course. Pt agrees with plan, advised to notify me if worsening discomfort or any fevers.    Relevant Orders      Urine culture    Other Visit Diagnoses    Bad odor of urine        Relevant Orders       POCT Urinalysis Dipstick (Completed)        Follow up plan: Return if symptoms worsen or fail to improve.

## 2014-10-28 NOTE — Progress Notes (Signed)
Pre visit review using our clinic review tool, if applicable. No additional management support is needed unless otherwise documented below in the visit note. 

## 2014-10-28 NOTE — Assessment & Plan Note (Signed)
Always with difficulty distinguishing between FM, interstitial cystitis, and UTI. UA today normal. However given her symptoms and complicated urological history will send urine culture today regardless. rec restart macrobid 100mg  daily for ppx, will await UCx prior to starting another abx course. Pt agrees with plan, advised to notify me if worsening discomfort or any fevers.

## 2014-10-28 NOTE — Patient Instructions (Signed)
Let's check urine culture today and we will call you with results. Restart macrobid antibiotic once daily for prevention.  If needed we will start another antibiotic. Push water and avoid caffeine.

## 2014-10-31 LAB — URINE CULTURE: Colony Count: >=100000

## 2014-11-02 ENCOUNTER — Other Ambulatory Visit: Payer: Self-pay | Admitting: Family Medicine

## 2014-11-02 MED ORDER — AMOXICILLIN 875 MG PO TABS: 875.0000 mg | ORAL_TABLET | Freq: Two times a day (BID) | ORAL | Status: DC

## 2014-11-18 ENCOUNTER — Ambulatory Visit: Payer: Medicare Other

## 2014-11-23 ENCOUNTER — Ambulatory Visit: Payer: Medicare Other | Admitting: Family Medicine

## 2014-11-24 ENCOUNTER — Encounter: Payer: Self-pay | Admitting: Family Medicine

## 2014-11-24 ENCOUNTER — Ambulatory Visit (INDEPENDENT_AMBULATORY_CARE_PROVIDER_SITE_OTHER): Payer: Medicare Other | Admitting: Family Medicine

## 2014-11-24 VITALS — BP 112/60 | HR 60 | Temp 97.5°F | Wt 120.2 lb

## 2014-11-24 DIAGNOSIS — E538 Deficiency of other specified B group vitamins: Secondary | ICD-10-CM

## 2014-11-24 DIAGNOSIS — G9050 Complex regional pain syndrome I, unspecified: Secondary | ICD-10-CM

## 2014-11-24 DIAGNOSIS — M797 Fibromyalgia: Secondary | ICD-10-CM

## 2014-11-24 DIAGNOSIS — N390 Urinary tract infection, site not specified: Secondary | ICD-10-CM

## 2014-11-24 DIAGNOSIS — R52 Pain, unspecified: Secondary | ICD-10-CM

## 2014-11-24 MED ORDER — FENTANYL 50 MCG/HR TD PT72: 50.0000 ug | MEDICATED_PATCH | TRANSDERMAL | Status: DC

## 2014-11-24 MED ORDER — CYANOCOBALAMIN 1000 MCG/ML IJ SOLN
1000.0000 ug | Freq: Once | INTRAMUSCULAR | Status: AC
  Administered 2014-11-24: 1000 ug via INTRAMUSCULAR

## 2014-11-24 MED ORDER — GABAPENTIN 100 MG PO CAPS: 200.0000 mg | ORAL_CAPSULE | Freq: Every day | ORAL | Status: DC

## 2014-11-24 NOTE — Assessment & Plan Note (Addendum)
Chronic symptoms - continue fentanyl patch, tramadol, increase neurontin to 200mg  nightly. Unable to tolerate daytime dose.

## 2014-11-24 NOTE — Assessment & Plan Note (Signed)
Will fax last visit and UCx attn Dr Amalia Hailey.

## 2014-11-24 NOTE — Assessment & Plan Note (Signed)
b12 shot today. 

## 2014-11-24 NOTE — Progress Notes (Signed)
BP 112/60 mmHg  Pulse 60  Temp(Src) 97.5 F (36.4 C) (Oral)  Wt 120 lb 4 oz (54.545 kg)   CC: 6 mo f/u visit  Subjective:    Patient ID: [name], female    DOB: 11-01-1946, 68 y.o.   MRN: [id number]  HPI: [name] is a 68 y.o. female presenting on 11/24/2014 for Follow-up   6 mo f/u visit today. 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 Also with ?h/o lupus.  RSD of R arm and leg - "arm giving me a fit". neurontin causes oversedation during the day but she takes 1 tab at night time (Also on fentanyl Q3d and klonopin bid).   Recently treated for resistant klebsiella UTI with 5d CTX IM course (11/10-14/2015). Then treated 10/28/2014 with amox 7d course for strep bovis UTI (10/28/2014). Known recurrent cystitis, interstitial cystitis Date: 10/2014 cystourethroscopy Dr Amalia Hailey - with hydrodistension of bladder, instillation with phenazopyridine, and bilateral retrograde pyelography - culture from R renal pelvis grew klebsiella pneumoniae sensitive to rocephin and MRSA (thought contaminant). Saw Dr Amalia Hailey yesterday, changing from [place] to a second abx (tmp? Nitrofur?). Pt will call and let us know.  Pt drinks plenty of water, does not hold urine. Currently without UTI sxs.  Relevant past medical, surgical, family and social history reviewed and updated as indicated. Interim medical history since our last visit reviewed. Allergies and medications reviewed and updated.  Current Outpatient Prescriptions on File Prior to Visit  Medication Sig  . Calcium Carb-Cholecalciferol (CALCIUM + D3) 600-200 MG-UNIT TABS Take 2 tablets by mouth daily.  . cholecalciferol (VITAMIN D) 1000 UNITS tablet Take 1,000 Units by mouth daily.  . clonazePAM (KLONOPIN) 0.5 MG tablet TAKE 1 TABLET BY MOUTH TWICE A DAY AS NEEDED. MUST LAST THROUGH 11/02/14  . conjugated estrogens (PREMARIN) vaginal  cream Place 1 Applicatorful vaginally daily.  . cyanocobalamin 1000 MCG/ML injection Inject 1,000 mcg into the muscle every 8 (eight) weeks.   Marland Kitchen latanoprost (XALATAN) 0.005 % ophthalmic solution Place 1 drop into both eyes at bedtime.  . nitrofurantoin, macrocrystal-monohydrate, (MACROBID) 100 MG capsule Take 100 mg by mouth daily.   Marland Kitchen omeprazole (PRILOSEC) 40 MG capsule TAKE ONE CAPSULE BY MOUTH EVERY DAY  . pentosan polysulfate (ELMIRON) 100 MG capsule Take 200 mg by mouth 2 (two) times daily.  . polyethylene glycol (GLYCOLAX) packet Take 17 g by mouth as needed.    . senna (SENOKOT) 8.6 MG tablet Take 1 tablet by mouth daily.    . traMADol (ULTRAM) 50 MG tablet TAKE 1 TABLET BY MOUTH TWICE A DAY AS NEEDED  . triamcinolone cream (KENALOG) 0.5 % APPLY TOPICALLY 3 (THREE) TIMES DAILY.  Marland Kitchen Zoledronic Acid (RECLAST IV) Inject into the vein. Yearly, 06/2013   No current facility-administered medications on file prior to visit.   Past Medical History  Diagnosis Date  . B12 DEFICIENCY 05/03/2007  . FIBROMYALGIA 05/03/2007  . ANEMIA-NOS 09/25/2007  . HYPERLIPIDEMIA 12/19/2007  . HYPOTENSION, ORTHOSTATIC 12/06/2008  . OSTEOPOROSIS 08/2009    bisphosphonate on hold 2/2 dysphagia, on reclast done 06/2013  . 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 GERD 02/22/2010  . Interstitial cystitis     [facility] now Dr Amalia Hailey  . CAD (coronary artery disease) 01/2010    MI, Nishan  . History of CHF (congestive heart failure) 01/2010  . Cardiomyopathy 01/2010  . History  of colon polyps 2004  . Takotsubo cardiomyopathy 2008    due to E coli urosepsis  . Glaucoma 02/2013    [facility]  . 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 Lupus (systemic lupus erythematosus) 02/08/2010    Past Surgical History  Procedure Laterality Date  . Cholecystectomy      complication - low blood pressure  . Abdominal hysterectomy  1970s    first partial then with oophorectomy, complication - low  blood pressure  . Colonoscopy  06/2008    h/o polyps but latest WNL, rec rpt 10 yrs Olevia Perches)  . Dexa  04/2013    T -2.9 @ femur, -1.6 @ spine  . Cystoscopy  12/2013    abx treatment for recurrent cystitis    Review of Systems Per HPI unless specifically indicated above     Objective:    BP 112/60 mmHg  Pulse 60  Temp(Src) 97.5 F (36.4 C) (Oral)  Wt 120 lb 4 oz (54.545 kg)  Wt Readings from Last 3 Encounters:  11/24/14 120 lb 4 oz (54.545 kg)  10/28/14 117 lb 8 oz (53.298 kg)  10/10/14 116 lb (52.617 kg)    Physical Exam  Constitutional: She appears well-developed and well-nourished. No distress.  Right handed  HENT:  Mouth/Throat: Oropharynx is clear and moist. No oropharyngeal exudate.  Neck: Normal range of motion. Neck supple.  Cardiovascular: Normal rate, normal heart sounds and intact distal pulses.   No murmur heard. Pulmonary/Chest: Effort normal and breath sounds normal. No respiratory distress. She has no wheezes. She has no rales.  Musculoskeletal: She exhibits no edema.  No rash Tender to palpation throughout R>L arm esp at elbow and olecranon but without bursitis, edema, warmth or redness.  Skin: Skin is warm and dry. No rash noted.  Psychiatric:  Flattened affect  Nursing note and vitals reviewed.      Assessment & Plan:   Problem List Items Addressed This Visit    Reflex sympathetic dystrophy - Primary    Chronic symptoms - continue fentanyl patch, tramadol, increase neurontin to 200mg  nightly. Unable to tolerate daytime dose.    Relevant Medications      gabapentin (NEURONTIN) capsule   Recurrent UTI    Will fax last visit and UCx attn Dr Amalia Hailey.    Fibromyalgia    Did not tolerate cymbalta    B12 deficiency    b12 shot today.     Other Visit Diagnoses    Pain        Relevant Medications       fentaNYL (DURAGESIC) patch 50 mcg/hr        Follow up plan: Return in about 6 months (around 05/26/2015) for follow up visit.

## 2014-11-24 NOTE — Progress Notes (Signed)
Pre visit review using our clinic review tool, if applicable. No additional management support is needed unless otherwise documented below in the visit note. 

## 2014-11-24 NOTE — Patient Instructions (Addendum)
Look into copper compression sleeve for arm which may help some with pain. Try neurontin 2 tablets at bedtime Return as needed or in 6 months for wellness visit.

## 2014-11-24 NOTE — Assessment & Plan Note (Signed)
Did not tolerate cymbalta

## 2014-12-28 ENCOUNTER — Other Ambulatory Visit: Payer: Self-pay | Admitting: *Deleted

## 2014-12-28 ENCOUNTER — Other Ambulatory Visit: Payer: Self-pay | Admitting: Family Medicine

## 2014-12-28 DIAGNOSIS — R52 Pain, unspecified: Secondary | ICD-10-CM

## 2014-12-28 MED ORDER — FENTANYL 50 MCG/HR TD PT72: 50.0000 ug | MEDICATED_PATCH | TRANSDERMAL | Status: DC

## 2014-12-28 NOTE — Telephone Encounter (Signed)
printed and in [name]'s box. 

## 2014-12-28 NOTE — Telephone Encounter (Signed)
plz phone in. 

## 2014-12-28 NOTE — Telephone Encounter (Signed)
Patient left voicemail request refill on her Duragesic patch. Last refill 11/24/14 #10, last office visit same date. Is it okay to refill medication?

## 2014-12-28 NOTE — Telephone Encounter (Signed)
rx ready for pick up and Left message on machine for patient 

## 2014-12-29 ENCOUNTER — Ambulatory Visit: Payer: Medicare Other

## 2014-12-29 NOTE — Telephone Encounter (Signed)
Rx called in as directed.   

## 2015-01-22 ENCOUNTER — Other Ambulatory Visit: Payer: Self-pay

## 2015-01-22 DIAGNOSIS — R52 Pain, unspecified: Secondary | ICD-10-CM

## 2015-01-22 MED ORDER — FENTANYL 50 MCG/HR TD PT72: 50.0000 ug | MEDICATED_PATCH | TRANSDERMAL | Status: DC

## 2015-01-22 NOTE — Telephone Encounter (Signed)
printed and in kim's box.

## 2015-01-22 NOTE — Telephone Encounter (Signed)
Pt left v/m requesting rx fentanyl patch. Call when ready for pick up. Pt last seen 11/24/14. rx last printed 12/28/14.

## 2015-01-25 NOTE — Telephone Encounter (Signed)
Message left notifying patient and Rx placed up front for pick up. 

## 2015-02-09 ENCOUNTER — Ambulatory Visit (INDEPENDENT_AMBULATORY_CARE_PROVIDER_SITE_OTHER): Payer: Medicare Other

## 2015-02-09 DIAGNOSIS — E538 Deficiency of other specified B group vitamins: Secondary | ICD-10-CM

## 2015-02-09 MED ORDER — CYANOCOBALAMIN 1000 MCG/ML IJ SOLN
1000.0000 ug | Freq: Once | INTRAMUSCULAR | Status: AC
  Administered 2015-02-09: 1000 ug via INTRAMUSCULAR

## 2015-02-23 ENCOUNTER — Encounter: Payer: Self-pay | Admitting: Family Medicine

## 2015-02-23 ENCOUNTER — Other Ambulatory Visit: Payer: Self-pay | Admitting: Family Medicine

## 2015-02-23 ENCOUNTER — Ambulatory Visit (INDEPENDENT_AMBULATORY_CARE_PROVIDER_SITE_OTHER): Payer: Medicare Other | Admitting: Family Medicine

## 2015-02-23 ENCOUNTER — Ambulatory Visit (INDEPENDENT_AMBULATORY_CARE_PROVIDER_SITE_OTHER)
Admission: RE | Admit: 2015-02-23 | Discharge: 2015-02-23 | Disposition: A | Discharge status: Home / Self Care | Payer: Medicare Other | Source: Ambulatory Visit | Attending: Family Medicine | Admitting: Family Medicine

## 2015-02-23 VITALS — BP 100/62 | HR 76 | Temp 97.6°F | Wt 113.4 lb

## 2015-02-23 DIAGNOSIS — M797 Fibromyalgia: Secondary | ICD-10-CM | POA: Diagnosis not present

## 2015-02-23 DIAGNOSIS — G9050 Complex regional pain syndrome I, unspecified: Secondary | ICD-10-CM | POA: Diagnosis not present

## 2015-02-23 DIAGNOSIS — R52 Pain, unspecified: Secondary | ICD-10-CM | POA: Diagnosis not present

## 2015-02-23 DIAGNOSIS — M79671 Pain in right foot: Secondary | ICD-10-CM

## 2015-02-23 DIAGNOSIS — Z639 Problem related to primary support group, unspecified: Secondary | ICD-10-CM

## 2015-02-23 HISTORY — DX: Problem related to primary support group, unspecified: Z63.9

## 2015-02-23 MED ORDER — FENTANYL 25 MCG/HR TD PT72
25.0000 ug | MEDICATED_PATCH | TRANSDERMAL | Status: DC
Start: 2015-02-23 — End: 2015-03-24

## 2015-02-23 NOTE — Progress Notes (Signed)
Pre visit review using our clinic review tool, if applicable. No additional management support is needed unless otherwise documented below in the visit note. 

## 2015-02-23 NOTE — Patient Instructions (Addendum)
Let's try lower fentanyl dose for the next month. Update me with effect. You should have gabapentin available for you at pharmacy.  Let's xray R foot. Restart gabapentin.

## 2015-02-23 NOTE — Telephone Encounter (Signed)
Rx called in as directed.   

## 2015-02-23 NOTE — Telephone Encounter (Signed)
plz phone in. 

## 2015-02-23 NOTE — Assessment & Plan Note (Addendum)
Support provided for recent family stressors. Despite significant psychosocial stressors pt still desires to trial lower fentanyl dose.
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
## 2015-02-23 NOTE — Assessment & Plan Note (Signed)
Persistent pain - worse since stopping gabapentin - pt will refill and start taking again as it was previously helpful. Ongoing pain since 08/2014. Check foot xray to r/o stress fracture or other cause of pain. Anticipate RSD flare vs neuropathy.

## 2015-02-23 NOTE — Assessment & Plan Note (Signed)
Pt desires to try taper off fentanyl patch to be able to qualify for study offered by urologist. Will trial 7mcg patch over next month. Pt will update me with effect.

## 2015-02-24 ENCOUNTER — Other Ambulatory Visit: Payer: Self-pay | Admitting: Family Medicine

## 2015-02-24 MED ORDER — NAPROXEN 500 MG PO TABS: ORAL_TABLET | ORAL | Status: DC

## 2015-02-24 NOTE — Telephone Encounter (Signed)
Rx called in as directed.   

## 2015-03-15 ENCOUNTER — Ambulatory Visit (INDEPENDENT_AMBULATORY_CARE_PROVIDER_SITE_OTHER): Payer: Medicare Other | Admitting: Primary Care

## 2015-03-15 ENCOUNTER — Encounter: Payer: Self-pay | Admitting: Primary Care

## 2015-03-15 VITALS — BP 104/64 | HR 88 | Temp 97.6°F | Ht 65.5 in | Wt 111.8 lb

## 2015-03-15 DIAGNOSIS — G9050 Complex regional pain syndrome I, unspecified: Secondary | ICD-10-CM | POA: Diagnosis not present

## 2015-03-15 DIAGNOSIS — G90521 Complex regional pain syndrome I of right lower limb: Secondary | ICD-10-CM | POA: Diagnosis not present

## 2015-03-15 MED ORDER — CAPSAICIN 0.035 % EX CREA: TOPICAL_CREAM | CUTANEOUS | Status: DC

## 2015-03-15 NOTE — Assessment & Plan Note (Addendum)
RSD to right lower extremity. (burning, color change in skin, pain present) Suspect flare up. Do no suspect DVT, negative homans. Recommendations include topical lidocaine or capsaicin. She is currently on gabapentin 200mg  HS. Due to allergy to lidocaine, will do capsaicin and recommend she take 100 mg of gabapentin in the morning and one at night. She is to follow up Friday for re-evaluation.

## 2015-03-15 NOTE — Progress Notes (Signed)
Pre visit review using our clinic review tool, if applicable. No additional management support is needed unless otherwise documented below in the visit note. 

## 2015-03-15 NOTE — Progress Notes (Signed)
Subjective:    Patient ID: [name], female    DOB: 1946-04-16, 69 y.o.   MRN: [id number]  HPI  Ms. Nienhuis is a 69 year old female who presents today with a chief complaint of burning sensation and soreness to right leg since Friday morning. On Saturday she noticed redness and an increased "stinging/burning" sensation.   2023/03/20 she noticed the redness started ascending up her right leg and noticed some swelling after a longer afternoon of walking. She's not been out in the woods, denies new laundry soaps or bathing products. She does report some itching, denies calf pain. Marland Kitchen Has not taken any additional medications over the counter. Denies recent injury. She reports the "it feels like someone has beat on me".  Review of Systems  Constitutional: Negative for fever and chills.  Respiratory: Negative for shortness of breath.   Cardiovascular: Negative for chest pain.  Gastrointestinal: Negative for nausea and vomiting.  Musculoskeletal:       No change in ROM, but sore after walking yesterday.  Skin: Positive for color change.       Past Medical History  Diagnosis Date  . B12 DEFICIENCY 05/03/2007  . FIBROMYALGIA 05/03/2007  . ANEMIA-NOS 09/25/2007  . HYPERLIPIDEMIA 12/19/2007  . HYPOTENSION, ORTHOSTATIC 12/06/2008  . OSTEOPOROSIS 08/2009    bisphosphonate on hold 2/2 dysphagia, on reclast done 06/2013  . 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 GERD 02/22/2010  . Interstitial cystitis     [place] now Dr Amalia Hailey  . CAD (coronary artery disease) 2010/03/19    MI, Nishan  . History of CHF (congestive heart failure) 03/19/2010  . Cardiomyopathy Mar 19, 2010  . History of colon polyps 2003-03-20  . Takotsubo cardiomyopathy 03-20-2007    due to E coli urosepsis  . Glaucoma 02/2013    [facility]  . 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 Lupus (systemic lupus erythematosus) 02/08/2010    History   Social History  . Marital Status: Married    Spouse Name: N/A  . Number  of Children: N/A  . Years of Education: N/A   Occupational History  . Not on file.   Social History Main Topics  . Smoking status: Never Smoker   . Smokeless tobacco: Never Used  . Alcohol Use: No  . Drug Use: No  . Sexual Activity: Not on file   Other Topics Concern  . Not on file   Social History Narrative   Widow - husband [name]) passed away 2013/03/19.     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 Cholecystectomy      complication - low blood pressure  . Abdominal hysterectomy  1970s    first partial then with oophorectomy, complication - low blood pressure  . Colonoscopy  06/2008    h/o polyps but latest WNL, rec rpt 10 yrs Olevia Perches)  . Dexa  04/2013    T -2.9 @ femur, -1.6 @ spine  . Cystoscopy  12/2013    abx treatment for recurrent cystitis    Family History  Problem Relation Age of Onset  . Cancer Mother     esophageal  . Heart attack Father   . Diabetes Father   . Prostate cancer  Father   . Ovarian cancer Sister   . Uterine cancer Sister   . Breast cancer Sister   . Colitis Sister   . Lung cancer Brother   . CAD Brother     Allergies  Allergen Reactions  . Amitriptyline Other (See Comments)    Sedated next morning  . Ciprofloxacin Nausea And Vomiting  . Cymbalta [Duloxetine Hcl] Other (See Comments)    Worsened depression  . Imipramine Hcl     REACTION: rash  . Iohexol      Code: HIVES, Desc: PT developed 2 hives, followed by SOB, severe headache post 87cc's Omnipaque 300., Onset Date: 20100712   . Lyrica [Pregabalin] Other (See Comments)    Tried during hospitalization - unsure effects but unable to tolerate  . Morphine Sulfate     REACTION: rash  . Sulfamethoxazole     REACTION: rash  . Lidocaine Hcl Rash  . Tetracyclines & Related  Rash    Current Outpatient Prescriptions on File Prior to Visit  Medication Sig Dispense Refill  . Calcium Carb-Cholecalciferol (CALCIUM + D3) 600-200 MG-UNIT TABS Take 2 tablets by mouth daily.    . cholecalciferol (VITAMIN D) 1000 UNITS tablet Take 1,000 Units by mouth daily.    . clonazePAM (KLONOPIN) 0.5 MG tablet TAKE 1 TABLET BY MOUTH TWICE A DAY AS NEEDED. MUST LAST THROUGH 11/02/14 60 tablet 1  . conjugated estrogens (PREMARIN) vaginal cream Place 1 Applicatorful vaginally daily.    . cyanocobalamin 1000 MCG/ML injection Inject 1,000 mcg into the muscle every 8 (eight) weeks.     . fentaNYL (DURAGESIC - DOSED MCG/HR) 25 MCG/HR patch Place 1 patch (25 mcg total) onto the skin every 3 (three) days. 10 patch 0  . gabapentin (NEURONTIN) 100 MG capsule Take 2 capsules (200 mg total) by mouth at bedtime. 60 capsule 11  . latanoprost (XALATAN) 0.005 % ophthalmic solution Place 1 drop into both eyes at bedtime.    . naproxen (NAPROSYN) 500 MG tablet Take one po bid x 5 days then prn pain, take with food 40 tablet 0  . omeprazole (PRILOSEC) 40 MG capsule TAKE ONE CAPSULE BY MOUTH EVERY DAY 90 capsule 3  . pentosan polysulfate (ELMIRON) 100 MG capsule Take 200 mg by mouth 2 (two) times daily.    . polyethylene glycol (GLYCOLAX) packet Take 17 g by mouth as needed.      . senna (SENOKOT) 8.6 MG tablet Take 1 tablet by mouth daily.      . traMADol (ULTRAM) 50 MG tablet Take 1 tablet (50 mg total) by mouth 3 (three) times daily as needed. 60 tablet 0  . triamcinolone cream (KENALOG) 0.5 % APPLY TOPICALLY 3 (THREE) TIMES DAILY. 30 g 1  . Zoledronic Acid (RECLAST IV) Inject into the vein. Yearly, 06/2013     No current facility-administered medications on file prior to visit.    BP 104/64 mmHg  Pulse 88  Temp(Src) 97.6 F (36.4 C) (Oral)  Ht 5' 5.5" (1.664 m)  Wt 111 lb 12.8 oz (50.712 kg)  BMI 18.31 kg/m2  SpO2 97%    Objective:   Physical Exam  Constitutional: She is oriented to  person, place, and time. She appears well-developed.  Neck: Neck supple.  Cardiovascular: Normal rate and regular rhythm.   Pulmonary/Chest: Effort normal and breath sounds normal.  Musculoskeletal: Normal range of motion.  Negative homans. Does not appear to be DVT.  Lymphadenopathy:    She has no cervical adenopathy.  Neurological: She is  alert and oriented to person, place, and time.  Skin: Skin is warm and dry.     Redness present to right anterior lower leg with faint evidence of ascending redness up to right patella. No swelling. No obvious insect bite, no trauma. Skin intact.  Psychiatric: She has a normal mood and affect.          Assessment & Plan:

## 2015-03-15 NOTE — Patient Instructions (Addendum)
Use the topical Capsaicin daily to right leg for your flare-up. Please rest over the next 3 days. You may try taking one gabapentin capsule in the morning and one in the evening instead of taking them both at night. If you begin to feel dizzy, then revert to taking them both at bedtime. Follow up on Friday for re-evaluation. Take care!

## 2015-03-17 ENCOUNTER — Telehealth: Payer: Self-pay | Admitting: *Deleted

## 2015-03-17 NOTE — Telephone Encounter (Signed)
Mickel Baas with [facility] left voicemail at Triage. The Capsaicin cream is on back order so [facility] wanted to see if you wanted to change the Rx or have pt try something OTC, pharmacy request call back at [phone]

## 2015-03-17 NOTE — Telephone Encounter (Signed)
Spoke with [name] and notified her that the Capsaicin strength i ordered in on national back order per [facility].  She is to get the Capsaicin over the counter strength and apply to her right leg. She is to follow up Friday this week for re-evaluation. She verbalized understanding.

## 2015-03-19 ENCOUNTER — Ambulatory Visit (INDEPENDENT_AMBULATORY_CARE_PROVIDER_SITE_OTHER): Payer: Medicare Other | Admitting: Primary Care

## 2015-03-19 ENCOUNTER — Encounter: Payer: Self-pay | Admitting: Primary Care

## 2015-03-19 VITALS — BP 104/68 | HR 81 | Temp 98.4°F | Ht 66.0 in | Wt 113.4 lb

## 2015-03-19 DIAGNOSIS — G9050 Complex regional pain syndrome I, unspecified: Secondary | ICD-10-CM | POA: Diagnosis not present

## 2015-03-19 NOTE — Patient Instructions (Addendum)
Continue applying the Capsaicin cream twice daily to your right leg for one week. Ensure to wear long pants if out in the sun to prevent sun exposure and sunburn which will increase your pain. Rest your right leg over the next 2-3 days. Follow up with Dr. Danise Mina in June as scheduled or sooner if needed. It was a pleasure meeting you! Take care!

## 2015-03-19 NOTE — Progress Notes (Signed)
Subjective:    Patient ID: [name], female    DOB: 05/03/1946, 69 y.o.   MRN: [id number]  HPI  [name] is a 69 year old female who presents today for follow up of right lower extremity pain and erythema. She has a history of reflex sympathetic dystrophy and it was determined, during her last appointment, that a flare up of this disorder was causing her symptoms. She had moderate erythema and tenderness to her right lower extremity last visit.  She was instructed to apply Capsaicin cream to her lower extremity twice daily and take 100mg  of her gabapentin in the morning and 100mg  at night, rather than 200mg  at night. She was unable to do this as it caused her to feel dizzy throughout the day. She started using the Capsaicin cream Wednesday afternoon and has used it for 2 days. She reports the redness has improved. The pain has slightly improved as it is no longer "burning",  but it's still present and feels more like a "tender soreness".  Review of Systems  Constitutional: Negative for fever and chills.  Respiratory: Negative for shortness of breath.   Cardiovascular: Negative for chest pain.  Musculoskeletal:       Right lower extremity pain.  Skin: Positive for color change.       Past Medical History  Diagnosis Date  . B12 DEFICIENCY 05/03/2007  . FIBROMYALGIA 05/03/2007  . ANEMIA-NOS 09/25/2007  . HYPERLIPIDEMIA 12/19/2007  . HYPOTENSION, ORTHOSTATIC 12/06/2008  . OSTEOPOROSIS 08/2009    bisphosphonate on hold 2/2 dysphagia, on reclast done 06/2013  . 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 GERD 02/22/2010  . Interstitial cystitis     [name] now Dr Amalia Hailey  . CAD (coronary artery disease) 03/02/10    MI, Nishan  . History of CHF (congestive heart failure) 02-Mar-2010  . Cardiomyopathy 03/02/2010  . History of colon polyps 03/03/03  . Takotsubo cardiomyopathy March 03, 2007    due to E coli urosepsis  . Glaucoma 02/2013    [facility]  . MCTD (mixed connective tissue  disease) 02/08/2010  . Lupus (systemic lupus erythematosus) 02/08/2010    History   Social History  . Marital Status: Married    Spouse Name: N/A  . Number of Children: N/A  . Years of Education: N/A   Occupational History  . Not on file.   Social History Main Topics  . Smoking status: Never Smoker   . Smokeless tobacco: Never Used  . Alcohol Use: No  . Drug Use: No  . Sexual Activity: Not on file   Other Topics Concern  . Not on file   Social History Narrative   Widow - husband [name]) passed away 03-02-13.     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 Cholecystectomy      complication - low blood pressure  . Abdominal hysterectomy  1970s    first partial then with oophorectomy, complication - low blood pressure  . Colonoscopy  06/2008    h/o polyps but latest WNL, rec rpt 10 yrs Olevia Perches)  . Dexa  04/2013    T -2.9 @ femur, -1.6 @ spine  . Cystoscopy  12/2013    abx treatment for recurrent  cystitis    Family History  Problem Relation Age of Onset  . Cancer Mother     esophageal  . Heart attack Father   . Diabetes Father   . Prostate cancer Father   . Ovarian cancer Sister   . Uterine cancer Sister   . Breast cancer Sister   . Colitis Sister   . Lung cancer Brother   . CAD Brother     Allergies  Allergen Reactions  . Amitriptyline Other (See Comments)    Sedated next morning  . Ciprofloxacin Nausea And Vomiting  . Cymbalta [Duloxetine Hcl] Other (See Comments)    Worsened depression  . Imipramine Hcl     REACTION: rash  . Iohexol      Code: HIVES, Desc: PT developed 2 hives, followed by SOB, severe headache post 87cc's Omnipaque 300., Onset Date: [id number]   . Lyrica [Pregabalin] Other (See Comments)    Tried during hospitalization - unsure  effects but unable to tolerate  . Morphine Sulfate     REACTION: rash  . Sulfamethoxazole     REACTION: rash  . Lidocaine Hcl Rash  . Tetracyclines & Related Rash    Current Outpatient Prescriptions on File Prior to Visit  Medication Sig Dispense Refill  . Calcium Carb-Cholecalciferol (CALCIUM + D3) 600-200 MG-UNIT TABS Take 2 tablets by mouth daily.    . Capsaicin 0.035 % CREA Apply topically to right leg for 5 days. 1 Tube 0  . cholecalciferol (VITAMIN D) 1000 UNITS tablet Take 1,000 Units by mouth daily.    . clonazePAM (KLONOPIN) 0.5 MG tablet TAKE 1 TABLET BY MOUTH TWICE A DAY AS NEEDED. MUST LAST THROUGH 11/02/14 60 tablet 1  . conjugated estrogens (PREMARIN) vaginal cream Place 1 Applicatorful vaginally daily.    . cyanocobalamin 1000 MCG/ML injection Inject 1,000 mcg into the muscle every 8 (eight) weeks.     . fentaNYL (DURAGESIC - DOSED MCG/HR) 25 MCG/HR patch Place 1 patch (25 mcg total) onto the skin every 3 (three) days. 10 patch 0  . gabapentin (NEURONTIN) 100 MG capsule Take 2 capsules (200 mg total) by mouth at bedtime. 60 capsule 11  . latanoprost (XALATAN) 0.005 % ophthalmic solution Place 1 drop into both eyes at bedtime.    . naproxen (NAPROSYN) 500 MG tablet Take one po bid x 5 days then prn pain, take with food 40 tablet 0  . omeprazole (PRILOSEC) 40 MG capsule TAKE ONE CAPSULE BY MOUTH EVERY DAY 90 capsule 3  . pentosan polysulfate (ELMIRON) 100 MG capsule Take 200 mg by mouth 2 (two) times daily.    . polyethylene glycol (GLYCOLAX) packet Take 17 g by mouth as needed.      . senna (SENOKOT) 8.6 MG tablet Take 1 tablet by mouth daily.      . traMADol (ULTRAM) 50 MG tablet Take 1 tablet (50 mg total) by mouth 3 (three) times daily as needed. 60 tablet 0  . triamcinolone cream (KENALOG) 0.5 % APPLY TOPICALLY 3 (THREE) TIMES DAILY. 30 g 1  . Zoledronic Acid (RECLAST IV) Inject into the vein. Yearly, 06/2013     No current facility-administered medications on file prior  to visit.    BP 104/68 mmHg  Pulse 81  Temp(Src) 98.4 F (36.9 C) (Oral)  Ht 5\' 6"  (1.676 m)  Wt 113 lb 6.4 oz (51.438 kg)  BMI 18.31 kg/m2  SpO2 98%    Objective:   Physical Exam  Constitutional: She is oriented to person, place, and  time. She appears well-developed.  Cardiovascular: Normal rate and regular rhythm.   Pulmonary/Chest: Effort normal and breath sounds normal.  Neurological: She is alert and oriented to person, place, and time.  Skin: Skin is warm, dry and intact. She is not diaphoretic. There is erythema.     Slight erythema present to lower extremity which is much improved from her last visit. Skin is tender upon palpation to reddened area, but is not tender proximal to the erythema.  Psychiatric: She has a normal mood and affect.          Assessment & Plan:  10 minutes was spent with patient during this visit.

## 2015-03-19 NOTE — Assessment & Plan Note (Signed)
Erythema much improved to right lower extremity. She continues to feel tender, but reports a reduction in pain overall. Continue Capsaicin cream twice daily for one week. Wear long pants if out in the sunlight. Take Gabapentin at night as prescribed. Rest for the next 2-3 days to ensure recovery. Follow up with PCP as scheduled in June, or sooner if needed.

## 2015-03-22 ENCOUNTER — Other Ambulatory Visit: Payer: Self-pay | Admitting: Family Medicine

## 2015-03-23 ENCOUNTER — Telehealth: Payer: Self-pay | Admitting: *Deleted

## 2015-03-23 DIAGNOSIS — R52 Pain, unspecified: Secondary | ICD-10-CM

## 2015-03-23 NOTE — Telephone Encounter (Signed)
Patient calling to follow up regarding titration off of medication.  Duragesic patch was decreased to 25mg .  She states she is doing well on this dose and would like to know where to go from here.  Please advise.

## 2015-03-24 MED ORDER — FENTANYL 12 MCG/HR TD PT72: 12.0000 ug | MEDICATED_PATCH | TRANSDERMAL | Status: DC

## 2015-03-24 NOTE — Telephone Encounter (Signed)
Let's decrease to 61mcg dose - printed and in Ki'ms box.

## 2015-03-24 NOTE — Telephone Encounter (Signed)
Patient advised and Rx placed up front for patient.

## 2015-03-29 ENCOUNTER — Other Ambulatory Visit: Payer: Self-pay | Admitting: Family Medicine

## 2015-03-29 NOTE — Telephone Encounter (Signed)
Ok to refill 

## 2015-03-30 NOTE — Telephone Encounter (Signed)
plz phone in. 

## 2015-03-30 NOTE — Telephone Encounter (Signed)
Medication phoned to pharmacy.  

## 2015-04-01 ENCOUNTER — Ambulatory Visit (INDEPENDENT_AMBULATORY_CARE_PROVIDER_SITE_OTHER): Payer: Medicare Other | Admitting: Family Medicine

## 2015-04-01 ENCOUNTER — Encounter: Payer: Self-pay | Admitting: Family Medicine

## 2015-04-01 VITALS — BP 110/60 | HR 88 | Temp 97.5°F | Wt 111.5 lb

## 2015-04-01 DIAGNOSIS — N390 Urinary tract infection, site not specified: Secondary | ICD-10-CM | POA: Diagnosis not present

## 2015-04-01 DIAGNOSIS — E538 Deficiency of other specified B group vitamins: Secondary | ICD-10-CM

## 2015-04-01 DIAGNOSIS — R300 Dysuria: Secondary | ICD-10-CM

## 2015-04-01 DIAGNOSIS — F321 Major depressive disorder, single episode, moderate: Secondary | ICD-10-CM | POA: Diagnosis not present

## 2015-04-01 LAB — POCT URINALYSIS DIPSTICK
Bilirubin, UA: NEGATIVE
GLUCOSE UA: NEGATIVE
Ketones, UA: NEGATIVE
NITRITE UA: POSITIVE
SPEC GRAV UA: 1.015
Urobilinogen, UA: 0.2
pH, UA: 6.5

## 2015-04-01 MED ORDER — SERTRALINE HCL 25 MG PO TABS: 25.0000 mg | ORAL_TABLET | Freq: Every day | ORAL | Status: DC

## 2015-04-01 MED ORDER — CEPHALEXIN 500 MG PO CAPS: 500.0000 mg | ORAL_CAPSULE | Freq: Two times a day (BID) | ORAL | Status: DC

## 2015-04-01 MED ORDER — CYANOCOBALAMIN 1000 MCG/ML IJ SOLN
1000.0000 ug | Freq: Once | INTRAMUSCULAR | Status: AC
  Administered 2015-04-01: 1000 ug via INTRAMUSCULAR
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
## 2015-04-01 NOTE — Assessment & Plan Note (Signed)
Complicated recurrent UTI in setting of IC. UA/micro today consistent with UTI - treat with 7d keflex course. UCx sent. Update if not improving with treatment.

## 2015-04-01 NOTE — Progress Notes (Signed)
Pre visit review using our clinic review tool, if applicable. No additional management support is needed unless otherwise documented below in the visit note. 

## 2015-04-01 NOTE — Addendum Note (Signed)
Addended by: [name] on: 04/01/2015 11:04 AM   Modules accepted: Miquel Dunn

## 2015-04-01 NOTE — Addendum Note (Signed)
Addended by: [name] on: 04/01/2015 10:41 AM   Modules accepted: Orders, SmartSet

## 2015-04-01 NOTE — Addendum Note (Signed)
Addended by: Royann Shivers A on: 04/01/2015 10:54 AM   Modules accepted: Orders

## 2015-04-01 NOTE — Addendum Note (Signed)
Addended by: [name] on: 04/01/2015 03:46 PM   Modules accepted: Miquel Dunn

## 2015-04-01 NOTE — Patient Instructions (Addendum)
Let's start sertraline 25mg  daily in the morning.  For urine symptoms - I do think you have infection. Treat with keflex 500mg  twice daily course for 7 week  Push water and rest. We have sent culture to ensure correct antibiotic Ensure you're taking trimethoprim preventatively - but hold while you're taking keflex course

## 2015-04-01 NOTE — Assessment & Plan Note (Signed)
Discussed significant family stressors. Start sertraline 25mg  daily, continue klonopin 0.5mg  bid prn. Using regularly.

## 2015-04-04 LAB — URINE CULTURE: Colony Count: >=100000

## 2015-04-06 ENCOUNTER — Other Ambulatory Visit: Payer: Self-pay | Admitting: Family Medicine

## 2015-04-06 MED ORDER — LEVOFLOXACIN 250 MG PO TABS: 250.0000 mg | ORAL_TABLET | Freq: Every day | ORAL | Status: DC

## 2015-04-25 ENCOUNTER — Other Ambulatory Visit: Payer: Self-pay | Admitting: Family Medicine

## 2015-04-27 NOTE — Telephone Encounter (Signed)
Ok to refill 

## 2015-04-28 NOTE — Telephone Encounter (Signed)
Rx called in as directed.   

## 2015-04-28 NOTE — Telephone Encounter (Signed)
plz phone in. 

## 2015-05-18 ENCOUNTER — Other Ambulatory Visit: Payer: Self-pay | Admitting: Family Medicine

## 2015-05-18 DIAGNOSIS — E538 Deficiency of other specified B group vitamins: Secondary | ICD-10-CM

## 2015-05-18 DIAGNOSIS — M810 Age-related osteoporosis without current pathological fracture: Secondary | ICD-10-CM

## 2015-05-18 DIAGNOSIS — D649 Anemia, unspecified: Secondary | ICD-10-CM

## 2015-05-18 DIAGNOSIS — E785 Hyperlipidemia, unspecified: Secondary | ICD-10-CM

## 2015-05-18 DIAGNOSIS — E559 Vitamin D deficiency, unspecified: Secondary | ICD-10-CM

## 2015-05-19 ENCOUNTER — Other Ambulatory Visit (INDEPENDENT_AMBULATORY_CARE_PROVIDER_SITE_OTHER): Payer: Medicare Other

## 2015-05-19 DIAGNOSIS — E538 Deficiency of other specified B group vitamins: Secondary | ICD-10-CM

## 2015-05-19 DIAGNOSIS — D649 Anemia, unspecified: Secondary | ICD-10-CM

## 2015-05-19 DIAGNOSIS — M810 Age-related osteoporosis without current pathological fracture: Secondary | ICD-10-CM | POA: Diagnosis not present

## 2015-05-19 DIAGNOSIS — E559 Vitamin D deficiency, unspecified: Secondary | ICD-10-CM

## 2015-05-19 DIAGNOSIS — E785 Hyperlipidemia, unspecified: Secondary | ICD-10-CM

## 2015-05-19 LAB — CBC WITH DIFFERENTIAL/PLATELET
BASOS ABS: 0 10*3/uL (ref 0.0–0.1)
Basophils Relative: 0.6 % (ref 0.0–3.0)
EOS ABS: 0.1 10*3/uL (ref 0.0–0.7)
Eosinophils Relative: 2.3 % (ref 0.0–5.0)
HEMATOCRIT: 34.7 % — AB (ref 36.0–46.0)
Hemoglobin: 11.2 g/dL — ABNORMAL LOW (ref 12.0–15.0)
LYMPHS ABS: 1.7 10*3/uL (ref 0.7–4.0)
Lymphocytes Relative: 50.7 % — ABNORMAL HIGH (ref 12.0–46.0)
MCHC: 32.3 g/dL (ref 30.0–36.0)
MCV: 84 fl (ref 78.0–100.0)
MONO ABS: 0.3 10*3/uL (ref 0.1–1.0)
Monocytes Relative: 7.9 % (ref 3.0–12.0)
NEUTROS PCT: 38.5 % — AB (ref 43.0–77.0)
Neutro Abs: 1.3 10*3/uL — ABNORMAL LOW (ref 1.4–7.7)
PLATELETS: 172 10*3/uL (ref 150.0–400.0)
RBC: 4.13 Mil/uL (ref 3.87–5.11)
RDW: 15.7 % — AB (ref 11.5–15.5)
WBC: 3.4 10*3/uL — AB (ref 4.0–10.5)

## 2015-05-19 LAB — LIPID PANEL
Cholesterol: 208 mg/dL — ABNORMAL HIGH (ref 0–200)
HDL: 91.8 mg/dL (ref >39.00)
LDL Cholesterol: 104 mg/dL — ABNORMAL HIGH (ref 0–99)
NONHDL: 116.2
Total CHOL/HDL Ratio: 2
Triglycerides: 61 mg/dL (ref 0.0–149.0)
VLDL: 12.2 mg/dL (ref 0.0–40.0)

## 2015-05-19 LAB — BASIC METABOLIC PANEL
BUN: 10 mg/dL (ref 6–23)
CHLORIDE: 104 meq/L (ref 96–112)
CO2: 27 meq/L (ref 19–32)
CREATININE: 0.7 mg/dL (ref 0.40–1.20)
Calcium: 9.9 mg/dL (ref 8.4–10.5)
GFR: 88.74 mL/min (ref >60.00)
GLUCOSE: 95 mg/dL (ref 70–99)
Potassium: 3.8 mEq/L (ref 3.5–5.1)
Sodium: 138 mEq/L (ref 135–145)

## 2015-05-19 LAB — VITAMIN D 25 HYDROXY (VIT D DEFICIENCY, FRACTURES): VITD: 44.93 ng/mL (ref 30.00–100.00)

## 2015-05-19 LAB — TSH: TSH: 0.98 u[IU]/mL (ref 0.35–4.50)

## 2015-05-19 LAB — VITAMIN B12: VITAMIN B 12: 340 pg/mL (ref 211–911)

## 2015-05-26 ENCOUNTER — Encounter: Payer: Self-pay | Admitting: Family Medicine

## 2015-05-26 ENCOUNTER — Ambulatory Visit: Payer: Medicare Other | Admitting: Family Medicine

## 2015-05-26 ENCOUNTER — Ambulatory Visit (INDEPENDENT_AMBULATORY_CARE_PROVIDER_SITE_OTHER): Payer: Medicare Other | Admitting: Family Medicine

## 2015-05-26 VITALS — BP 112/70 | HR 68 | Temp 98.0°F | Ht 64.75 in | Wt 116.0 lb

## 2015-05-26 DIAGNOSIS — F321 Major depressive disorder, single episode, moderate: Secondary | ICD-10-CM

## 2015-05-26 DIAGNOSIS — Z23 Encounter for immunization: Secondary | ICD-10-CM | POA: Diagnosis not present

## 2015-05-26 DIAGNOSIS — E785 Hyperlipidemia, unspecified: Secondary | ICD-10-CM

## 2015-05-26 DIAGNOSIS — Z0000 Encounter for general adult medical examination without abnormal findings: Secondary | ICD-10-CM | POA: Diagnosis not present

## 2015-05-26 DIAGNOSIS — R251 Tremor, unspecified: Secondary | ICD-10-CM

## 2015-05-26 DIAGNOSIS — G9050 Complex regional pain syndrome I, unspecified: Secondary | ICD-10-CM

## 2015-05-26 DIAGNOSIS — I2510 Atherosclerotic heart disease of native coronary artery without angina pectoris: Secondary | ICD-10-CM

## 2015-05-26 DIAGNOSIS — Z7189 Other specified counseling: Secondary | ICD-10-CM

## 2015-05-26 DIAGNOSIS — E559 Vitamin D deficiency, unspecified: Secondary | ICD-10-CM

## 2015-05-26 DIAGNOSIS — D649 Anemia, unspecified: Secondary | ICD-10-CM

## 2015-05-26 DIAGNOSIS — E538 Deficiency of other specified B group vitamins: Secondary | ICD-10-CM

## 2015-05-26 DIAGNOSIS — M810 Age-related osteoporosis without current pathological fracture: Secondary | ICD-10-CM

## 2015-05-26 HISTORY — DX: Other specified counseling: Z71.89

## 2015-05-26 NOTE — Assessment & Plan Note (Addendum)
Improvement noted on sertraline 25mg . Continue this along with klonopin.

## 2015-05-26 NOTE — Assessment & Plan Note (Signed)
reclast Q August. Consider rpt DEXA in the next year.

## 2015-05-26 NOTE — Assessment & Plan Note (Addendum)
Discussed gabapentin dosing Tremor, pain.

## 2015-05-26 NOTE — Progress Notes (Signed)
BP 112/70 mmHg  Pulse 68  Temp(Src) 98 F (36.7 C) (Oral)  Ht 5' 4.75" (1.645 m)  Wt 116 lb (52.617 kg)  BMI 19.44 kg/m2   CC: medicare wellness visit  Subjective:    Patient ID: [name], female    DOB: Oct 20, 1946, 69 y.o.   MRN: [id number]  HPI: [name] is a 69 y.o. female presenting on 05/26/2015 for Annual Exam   Passes hearing screen today.  Vision exam - at eye doctor. Glaucoma - referred to glaucoma specialist at [facility].  Denies depression, anhedonia, sadness.  Denies falls in last year.   Preventative: COLONOSCOPY Date: 06/2008 h/o polyps but latest WNL, rec rpt 10 yrs Olevia Perches)  DEXA Date: 04/2013 T -2.9 @ femur, -1.6 @ spine. On reclast every August (x2 yrs).  Mammogram 07/2014 WNL.  Well woman - s/p hysterectomy with oophorectomy.  Flu - 07/2014 prevnar 04/2014, pneumovax today Tdap 2012  Zostavax 2011  Advanced directives: packet provided today. Discussing with son from [place] who would be HCPOA. Seat belt use discussed Sunscreen use discussed. No changing moles on skin.  Lives with daughter, 1 dog. Widow of husband [name]) 2015. 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 Interim medical history since our last visit reviewed. Allergies and medications reviewed and updated. Current Outpatient Prescriptions on File Prior to Visit  Medication Sig  . Calcium Carb-Cholecalciferol (CALCIUM + D3) 600-200 MG-UNIT TABS Take 2 tablets by mouth daily.  . Capsaicin 0.035 % CREA Apply topically to right leg for 5 days.  . cholecalciferol (VITAMIN D) 1000 UNITS tablet Take 1,000 Units by mouth daily.  . clonazePAM (KLONOPIN) 0.5 MG tablet TAKE 1 TABLET BY MOUTH TWICE A DAY AS NEEDED. MUST LAST THROUGH 11/02/14  . conjugated  estrogens (PREMARIN) vaginal cream Place 1 Applicatorful vaginally daily.  . cyanocobalamin 1000 MCG/ML injection Inject 1,000 mcg into the muscle every 8 (eight) weeks.   Marland Kitchen latanoprost (XALATAN) 0.005 % ophthalmic solution Place 1 drop into both eyes at bedtime.  Marland Kitchen omeprazole (PRILOSEC) 40 MG capsule TAKE ONE CAPSULE BY MOUTH EVERY DAY  . pentosan polysulfate (ELMIRON) 100 MG capsule Take 200 mg by mouth 2 (two) times daily.  . polyethylene glycol (GLYCOLAX) packet Take 17 g by mouth as needed.    . senna (SENOKOT) 8.6 MG tablet Take 1 tablet by mouth daily.    . sertraline (ZOLOFT) 25 MG tablet Take 1 tablet (25 mg total) by mouth daily.  . traMADol (ULTRAM) 50 MG tablet TAKE 1 TABLET BY MOUTH 3 TIMES DAILY AS NEEDED  . triamcinolone cream (KENALOG) 0.5 % APPLY TOPICALLY 3 (THREE) TIMES DAILY.  Marland Kitchen trimethoprim (TRIMPEX) 100 MG tablet Take 100 mg by mouth daily. UTI ppx  . Zoledronic Acid (RECLAST IV) Inject into the vein. Yearly, 06/2013   No current facility-administered medications on file prior to visit.    Review of Systems  Constitutional: Negative for fever, chills, activity change, appetite change, fatigue and unexpected weight change.  HENT: Negative for hearing loss.   Eyes: Negative for visual disturbance.  Respiratory: Negative for cough, chest tightness, shortness of breath and wheezing.   Cardiovascular: Positive for leg swelling (R side from h/o RLS). Negative for chest pain and palpitations.  Gastrointestinal: Negative for nausea, vomiting, abdominal pain,  diarrhea, constipation, blood in stool and abdominal distention.  Genitourinary: Negative for hematuria and difficulty urinating.  Musculoskeletal: Negative for myalgias, arthralgias and neck pain.  Skin: Negative for rash.  Neurological: Negative for dizziness, seizures, syncope and headaches.  Hematological: Negative for adenopathy. Does not bruise/bleed easily.  Psychiatric/Behavioral: Positive for dysphoric mood  (improved with zoloft). The patient is not nervous/anxious.    Per HPI unless specifically indicated above     Objective:    BP 112/70 mmHg  Pulse 68  Temp(Src) 98 F (36.7 C) (Oral)  Ht 5' 4.75" (1.645 m)  Wt 116 lb (52.617 kg)  BMI 19.44 kg/m2  Wt Readings from Last 3 Encounters:  05/26/15 116 lb (52.617 kg)  04/01/15 111 lb 8 oz (50.576 kg)  03/19/15 113 lb 6.4 oz (51.438 kg)    Physical Exam  Constitutional: She is oriented to person, place, and time. She appears well-developed and well-nourished. No distress.  HENT:  Head: Normocephalic and atraumatic.  Right Ear: Hearing, tympanic membrane, external ear and ear canal normal.  Left Ear: Hearing, tympanic membrane, external ear and ear canal normal.  Nose: Nose normal.  Mouth/Throat: Uvula is midline, oropharynx is clear and moist and mucous membranes are normal. No oropharyngeal exudate, posterior oropharyngeal edema or posterior oropharyngeal erythema.  Eyes: Conjunctivae and EOM are normal. Pupils are equal, round, and reactive to light. No scleral icterus.  Neck: Normal range of motion. Neck supple. Carotid bruit is not present. No thyromegaly present.  Cardiovascular: Normal rate, regular rhythm, normal heart sounds and intact distal pulses.   No murmur heard. Pulses:      Radial pulses are 2+ on the right side, and 2+ on the left side.  Pulmonary/Chest: Effort normal and breath sounds normal. No respiratory distress. She has no wheezes. She has no rales.  Abdominal: Soft. Bowel sounds are normal. She exhibits no distension and no mass. There is no tenderness. There is no rebound and no guarding.  Musculoskeletal: Normal range of motion. She exhibits no edema.  Lymphadenopathy:    She has no cervical adenopathy.  Neurological: She is alert and oriented to person, place, and time.  CN grossly intact, station and gait intact Recall 2/3 , 3/3 with cues Calculation 5/5 serial 3s  Skin: Skin is warm and dry. No rash  noted.  Psychiatric: She has a normal mood and affect. Her behavior is normal. Judgment and thought content normal.  Nursing note and vitals reviewed.  Results for orders placed or performed in visit on 05/19/15  Lipid panel  Result Value Ref Range   Cholesterol 208 (H) 0 - 200 mg/dL   Triglycerides 61.0 0.0 - 149.0 mg/dL   HDL 91.80 >39.00 mg/dL   VLDL 12.2 0.0 - 40.0 mg/dL   LDL Cholesterol 104 (H) 0 - 99 mg/dL   Total CHOL/HDL Ratio 2    NonHDL 116.20   TSH  Result Value Ref Range   TSH 0.98 0.35 - 4.50 uIU/mL  Basic metabolic panel  Result Value Ref Range   Sodium 138 135 - 145 mEq/L   Potassium 3.8 3.5 - 5.1 mEq/L   Chloride 104 96 - 112 mEq/L   CO2 27 19 - 32 mEq/L   Glucose, Bld 95 70 - 99 mg/dL   BUN 10 6 - 23 mg/dL   Creatinine, Ser 0.70 0.40 - 1.20 mg/dL   Calcium 9.9 8.4 - 10.5 mg/dL   GFR 88.74 >60.00 mL/min  Vitamin B12  Result Value Ref Range   Vitamin  B-12 340 211 - 911 pg/mL  Vit D  25 hydroxy (rtn osteoporosis monitoring)  Result Value Ref Range   VITD 44.93 30.00 - 100.00 ng/mL  CBC with Differential/Platelet  Result Value Ref Range   WBC 3.4 (L) 4.0 - 10.5 K/uL   RBC 4.13 3.87 - 5.11 Mil/uL   Hemoglobin 11.2 (L) 12.0 - 15.0 g/dL   HCT 34.7 (L) 36.0 - 46.0 %   MCV 84.0 78.0 - 100.0 fl   MCHC 32.3 30.0 - 36.0 g/dL   RDW 15.7 (H) 11.5 - 15.5 %   Platelets 172.0 150.0 - 400.0 K/uL   Neutrophils Relative % 38.5 (L) 43.0 - 77.0 %   Lymphocytes Relative 50.7 (H) 12.0 - 46.0 %   Monocytes Relative 7.9 3.0 - 12.0 %   Eosinophils Relative 2.3 0.0 - 5.0 %   Basophils Relative 0.6 0.0 - 3.0 %   Neutro Abs 1.3 (L) 1.4 - 7.7 K/uL   Lymphs Abs 1.7 0.7 - 4.0 K/uL   Monocytes Absolute 0.3 0.1 - 1.0 K/uL   Eosinophils Absolute 0.1 0.0 - 0.7 K/uL   Basophils Absolute 0.0 0.0 - 0.1 K/uL      Assessment & Plan:   Problem List Items Addressed This Visit    Advanced care planning/counseling discussion    Advanced directives: packet provided today. Discussing  with son from [place] who would be HCPOA.      Anemia    Mild with mild leukopenia. Will continue to monitor.       B12 deficiency    Continue B12 shots Q2 months      CAD (coronary artery disease)    Chol levels stable off statin.      Health maintenance examination    Preventative protocols reviewed and updated unless pt declined. Discussed healthy diet and lifestyle.       HLD (hyperlipidemia)    Chronic, stable off meds.      MDD (major depressive disorder), single episode, moderate    Improvement noted on sertraline 25mg . Continue this along with klonopin.      Medicare annual wellness visit, subsequent - Primary    I have personally reviewed the Medicare Annual Wellness questionnaire and have noted 1. The patient's medical and social history 2. Their use of alcohol, tobacco or illicit drugs 3. Their current medications and supplements 4. The patient's functional ability including ADL's, fall risks, home safety risks and hearing or visual impairment. Cognitive function has been assessed and addressed as indicated.  5. Diet and physical activity 6. Evidence for depression or mood disorders The patients weight, height, BMI have been recorded in the chart. I have made referrals, counseling and provided education to the patient based on review of the above and I have provided the pt with a written personalized care plan for preventive services. Provider list updated.. See scanned questionairre as needed for further documentation. Reviewed preventative protocols and updated unless pt declined.       Osteoporosis    reclast Q August. Consider rpt DEXA in the next year.       Reflex sympathetic dystrophy    Discussed gabapentin dosing Tremor, pain.       Relevant Medications   gabapentin (NEURONTIN) 100 MG capsule   Tremor    Thought from RSD. No other parkinsonistic features like anosmia, imbalance, memory loss.      Vitamin D deficiency    Levels at  goal          Follow up  plan: Return in about 6 months (around 11/25/2015), or as needed, for follow up visit.

## 2015-05-26 NOTE — Assessment & Plan Note (Signed)
Mild with mild leukopenia. Will continue to monitor.

## 2015-05-26 NOTE — Assessment & Plan Note (Signed)
Advanced directives: packet provided today. Discussing with son from [place] who would be HCPOA.

## 2015-05-26 NOTE — Assessment & Plan Note (Signed)
Chronic, stable off meds.  

## 2015-05-26 NOTE — Progress Notes (Signed)
Pre visit review using our clinic review tool, if applicable. No additional management support is needed unless otherwise documented below in the visit note. 

## 2015-05-26 NOTE — Assessment & Plan Note (Signed)

## 2015-05-26 NOTE — Patient Instructions (Addendum)
Pneumovax today. Continue working on advanced directive and bring me copy when you're done.  Return in 6 months for follow up visit. Good to see you today, call us with questions

## 2015-05-26 NOTE — Assessment & Plan Note (Signed)
Thought from RSD. No other parkinsonistic features like anosmia, imbalance, memory loss.

## 2015-05-26 NOTE — Addendum Note (Signed)
Addended by: Royann Shivers A on: 05/26/2015 11:27 AM   Modules accepted: Orders

## 2015-05-26 NOTE — Assessment & Plan Note (Signed)
Chol levels stable off statin.

## 2015-05-26 NOTE — Assessment & Plan Note (Signed)
Levels at goal.  

## 2015-05-26 NOTE — Assessment & Plan Note (Signed)
Continue B12 shots Q2 months

## 2015-05-26 NOTE — Assessment & Plan Note (Signed)
Preventative protocols reviewed and updated unless pt declined. Discussed healthy diet and lifestyle.  

## 2015-06-03 ENCOUNTER — Ambulatory Visit: Payer: Medicare Other

## 2015-06-07 ENCOUNTER — Other Ambulatory Visit: Payer: Self-pay | Admitting: Family Medicine

## 2015-06-07 NOTE — Telephone Encounter (Signed)
Ok to refill 

## 2015-06-07 NOTE — Telephone Encounter (Signed)
plz phone in. 

## 2015-06-08 NOTE — Telephone Encounter (Signed)
Rx called in as directed.   

## 2015-06-29 ENCOUNTER — Other Ambulatory Visit: Payer: Self-pay | Admitting: Family Medicine

## 2015-06-30 NOTE — Telephone Encounter (Signed)
Ok to refill 

## 2015-06-30 NOTE — Telephone Encounter (Signed)
plz phone in. 

## 2015-06-30 NOTE — Telephone Encounter (Signed)
Rx called in as directed.   

## 2015-07-04 ENCOUNTER — Other Ambulatory Visit: Payer: Self-pay | Admitting: Family Medicine

## 2015-07-05 NOTE — Telephone Encounter (Signed)
Ok to refill 

## 2015-07-06 NOTE — Telephone Encounter (Signed)
Ok to refill in Dr. Synthia Innocent absence? Last filled 06/07/15 #60 0RF

## 2015-07-06 NOTE — Telephone Encounter (Signed)
Rx called in as directed.   

## 2015-07-06 NOTE — Telephone Encounter (Signed)
Ok to phone in tramadol to be filled on or after 8/11

## 2015-07-07 ENCOUNTER — Other Ambulatory Visit: Payer: Self-pay | Admitting: Family Medicine

## 2015-07-15 ENCOUNTER — Ambulatory Visit (INDEPENDENT_AMBULATORY_CARE_PROVIDER_SITE_OTHER): Payer: Medicare Other | Admitting: *Deleted

## 2015-07-15 DIAGNOSIS — E538 Deficiency of other specified B group vitamins: Secondary | ICD-10-CM

## 2015-07-15 MED ORDER — CYANOCOBALAMIN 1000 MCG/ML IJ SOLN
1000.0000 ug | Freq: Once | INTRAMUSCULAR | Status: AC
  Administered 2015-07-15: 1000 ug via INTRAMUSCULAR

## 2015-07-27 ENCOUNTER — Encounter: Payer: Self-pay | Admitting: Internal Medicine

## 2015-07-28 ENCOUNTER — Other Ambulatory Visit: Payer: Self-pay | Admitting: Family Medicine

## 2015-07-28 NOTE — Telephone Encounter (Signed)
Plz phone in

## 2015-07-28 NOTE — Telephone Encounter (Signed)
Ok to refill 

## 2015-07-28 NOTE — Telephone Encounter (Signed)
Rx called in as directed.   

## 2015-08-01 ENCOUNTER — Other Ambulatory Visit: Payer: Self-pay | Admitting: Family Medicine

## 2015-08-12 ENCOUNTER — Ambulatory Visit: Payer: Medicare Other

## 2015-08-24 ENCOUNTER — Encounter: Payer: Self-pay | Admitting: Family Medicine

## 2015-08-24 ENCOUNTER — Ambulatory Visit (INDEPENDENT_AMBULATORY_CARE_PROVIDER_SITE_OTHER): Payer: Medicare Other | Admitting: Family Medicine

## 2015-08-24 VITALS — BP 114/62 | HR 88 | Temp 97.8°F | Wt 120.0 lb

## 2015-08-24 DIAGNOSIS — G9050 Complex regional pain syndrome I, unspecified: Secondary | ICD-10-CM

## 2015-08-24 DIAGNOSIS — R251 Tremor, unspecified: Secondary | ICD-10-CM

## 2015-08-24 DIAGNOSIS — N390 Urinary tract infection, site not specified: Secondary | ICD-10-CM

## 2015-08-24 DIAGNOSIS — R300 Dysuria: Secondary | ICD-10-CM | POA: Diagnosis not present

## 2015-08-24 LAB — POCT URINALYSIS DIPSTICK
BILIRUBIN UA: NEGATIVE
GLUCOSE UA: NEGATIVE
Ketones, UA: NEGATIVE
Nitrite, UA: POSITIVE
Protein, UA: NEGATIVE
RBC UA: NEGATIVE
SPEC GRAV UA: 1.01
Urobilinogen, UA: 0.2
pH, UA: 6.5

## 2015-08-24 MED ORDER — CEFTRIAXONE SODIUM 1 G IJ SOLR
1.0000 g | Freq: Once | INTRAMUSCULAR | Status: AC
  Administered 2015-08-24: 1 g via INTRAMUSCULAR

## 2015-08-24 MED ORDER — TRAMADOL HCL 50 MG PO TABS: 50.0000 mg | ORAL_TABLET | Freq: Three times a day (TID) | ORAL | Status: DC | PRN

## 2015-08-24 MED ORDER — CEPHALEXIN 500 MG PO CAPS: 500.0000 mg | ORAL_CAPSULE | Freq: Two times a day (BID) | ORAL | Status: DC

## 2015-08-24 NOTE — Assessment & Plan Note (Signed)
Increased tone/cogwheeling of right arm only.  Very painful exam. Pt remains concerned, noticing loss of function of R hand (R hand dominant) Will refer to neuro for eval/second opinion.

## 2015-08-24 NOTE — Assessment & Plan Note (Signed)
Will refer to neuro for eval/second opinion.

## 2015-08-24 NOTE — Progress Notes (Signed)
BP 114/62 mmHg  Pulse 88  Temp(Src) 97.8 F (36.6 C) (Oral)  Wt 120 lb (54.432 kg)   CC: discuss R arm pain, ?UTI  Subjective:    Patient ID: [name], female    DOB: [birth year], 69 y.o.   MRN: [id number]  HPI: [name] is a 69 y.o. female presenting on 08/24/2015 for Urinary Tract Infection   1 wk h/o urgency, frequency, dysuria, abd pain and nausea. No fevers/chills.   Last UTI 03/2015 >100k pseudomonas treated with levaquin 250mg  x7d. H/o recurrent UTIs and interstitial cystitis. On elmiron and TMP 100mg  daily.   Treated for resistant klebsiella UTI with 5d CTX IM course (11/10-14/2015). Then treated 10/28/2014 with amox 7d course for strep bovis UTI (10/28/2014). Then treated for another UTI by urology March or April of this year.   10/2014 cystourethroscopy Dr Amalia Hailey - with hydrodistension of bladder, instillation with phenazopyridine, and bilateral retrograde pyelography - culture from R renal pelvis grew klebsiella pneumoniae sensitive to rocephin and MRSA (thought contaminant).  Known RSD R arm/hand. Progressively worsening pain, tremors, losing strength. Tremors worse with stress as well. No numbness or color changes of right forearm. Occasionally R arm swells. Currently controlled with gabapentin 1 100mg  dose at 2pm and 1 at bedtime. Pain starts in shoulder and travels down elbow to hand. Sense of smell intact. New L handed tremor that started over the past month. No gait unsteadiness.   Relevant past medical, surgical, family and social history reviewed and updated as indicated. Interim medical history since our last visit reviewed. Allergies and medications reviewed and updated. Current Outpatient Prescriptions on File Prior to Visit  Medication Sig  . Calcium Carb-Cholecalciferol (CALCIUM + D3) 600-200 MG-UNIT TABS Take 2 tablets by mouth daily.  . Capsaicin 0.035 % CREA Apply topically to right leg for 5 days.  . cholecalciferol (VITAMIN D) 1000 UNITS tablet Take  1,000 Units by mouth daily.  . clonazePAM (KLONOPIN) 0.5 MG tablet TAKE 1 TABLET BY MOUTH TWICE A DAY AS NEEDED  . conjugated estrogens (PREMARIN) vaginal cream Place 1 Applicatorful vaginally daily.  . cyanocobalamin 1000 MCG/ML injection Inject 1,000 mcg into the muscle every 8 (eight) weeks.   Marland Kitchen latanoprost (XALATAN) 0.005 % ophthalmic solution Place 1 drop into both eyes at bedtime.  Marland Kitchen omeprazole (PRILOSEC) 40 MG capsule TAKE ONE CAPSULE BY MOUTH EVERY DAY  . pentosan polysulfate (ELMIRON) 100 MG capsule Take 200 mg by mouth 2 (two) times daily.  . polyethylene glycol (GLYCOLAX) packet Take 17 g by mouth as needed.    . senna (SENOKOT) 8.6 MG tablet Take 1 tablet by mouth daily.    . sertraline (ZOLOFT) 25 MG tablet TAKE 1 TABLET (25 MG TOTAL) BY MOUTH DAILY.  Marland Kitchen triamcinolone cream (KENALOG) 0.5 % APPLY TOPICALLY 3 (THREE) TIMES DAILY.  Marland Kitchen trimethoprim (TRIMPEX) 100 MG tablet Take 100 mg by mouth daily. UTI ppx  . Zoledronic Acid (RECLAST IV) Inject into the vein. Yearly, 06/2013   No current facility-administered medications on file prior to visit.    Review of Systems Per HPI unless specifically indicated above     Objective:    BP 114/62 mmHg  Pulse 88  Temp(Src) 97.8 F (36.6 C) (Oral)  Wt 120 lb (54.432 kg)  Wt Readings from Last 3 Encounters:  08/24/15 120 lb (54.432 kg)  05/26/15 116 lb (52.617 kg)  04/01/15 111 lb 8 oz (50.576 kg)    Physical Exam  Constitutional: She appears well-developed and well-nourished. No  distress.  Abdominal: Soft. Normal appearance and bowel sounds are normal. She exhibits no distension and no mass. There is generalized tenderness (mild-mod). There is CVA tenderness. There is no rigidity, no rebound, no guarding and negative Murphy's sign.  Musculoskeletal: She exhibits no edema.  2+ rad pulses Mild swelling of R forearm without erythema FROM L elbow Limited ROM 2/2 pain R elbow  Neurological: Coordination and gait normal.  Resting tremor  present R>L hands Weakness of R>L hand noted  Skin: Skin is warm and dry. No rash noted.  Psychiatric:  Blunted affect  Nursing note and vitals reviewed.  Results for orders placed or performed in visit on 08/24/15  POCT Urinalysis Dipstick  Result Value Ref Range   Color, UA Yellow    Clarity, UA Cloudy    Glucose, UA Negative    Bilirubin, UA Negative    Ketones, UA Negative    Spec Grav, UA 1.010    Blood, UA Negative    pH, UA 6.5    Protein, UA Negative    Urobilinogen, UA 0.2    Nitrite, UA Positive    Leukocytes, UA small (1+) (A) Negative   Lab Results  Component Value Date   CREATININE 0.70 05/19/2015       Assessment & Plan:   Problem List Items Addressed This Visit    Tremor    Will refer to neuro for eval/second opinion.      Relevant Orders   Ambulatory referral to Neurology   Reflex sympathetic dystrophy    Increased tone/cogwheeling of right arm only.  Very painful exam. Pt remains concerned, noticing loss of function of R hand (R hand dominant) Will refer to neuro for eval/second opinion.      Relevant Medications   gabapentin (NEURONTIN) 100 MG capsule   Other Relevant Orders   Ambulatory referral to Neurology   Recurrent UTI - Primary    Anticipate recurrent UTI - with CVA tenderness and nausea, concern for upper tract disease. Treat with CTX shot and then 7d keflex course.  Pt agrees with plan.      Relevant Medications   cephALEXin (KEFLEX) 500 MG capsule   Other Relevant Orders   Urine culture   Dysuria   Relevant Orders   POCT Urinalysis Dipstick (Completed)       Follow up plan: Return if symptoms worsen or fail to improve.

## 2015-08-24 NOTE — Patient Instructions (Signed)
We will refer you to neurologist for further evaluation. Rocephin shot today 1gm IM Start keflex antibiotic for 7 days. We will send urine culture.

## 2015-08-24 NOTE — Addendum Note (Signed)
Addended by: [name] on: 08/24/2015 04:06 PM   Modules accepted: Miquel Dunn

## 2015-08-24 NOTE — Addendum Note (Signed)
Addended by: Royann Shivers A on: 08/24/2015 04:26 PM   Modules accepted: Orders

## 2015-08-24 NOTE — Assessment & Plan Note (Signed)
Anticipate recurrent UTI - with CVA tenderness and nausea, concern for upper tract disease. Treat with CTX shot and then 7d keflex course.  Pt agrees with plan.

## 2015-08-24 NOTE — Progress Notes (Signed)
Pre visit review using our clinic review tool, if applicable. No additional management support is needed unless otherwise documented below in the visit note. 

## 2015-08-25 ENCOUNTER — Encounter: Payer: Self-pay | Admitting: Family Medicine

## 2015-08-25 ENCOUNTER — Ambulatory Visit (INDEPENDENT_AMBULATORY_CARE_PROVIDER_SITE_OTHER): Payer: Medicare Other | Admitting: Neurology

## 2015-08-25 ENCOUNTER — Encounter: Payer: Self-pay | Admitting: Neurology

## 2015-08-25 VITALS — BP 90/60 | HR 80 | Ht 65.0 in | Wt 119.0 lb

## 2015-08-25 DIAGNOSIS — G20 Parkinson's disease: Secondary | ICD-10-CM

## 2015-08-25 DIAGNOSIS — G20A1 Parkinson's disease without dyskinesia, without mention of fluctuations: Secondary | ICD-10-CM

## 2015-08-25 HISTORY — DX: Parkinson's disease: G20

## 2015-08-25 HISTORY — DX: Parkinson's disease without dyskinesia, without mention of fluctuations: G20.A1

## 2015-08-25 MED ORDER — CARBIDOPA-LEVODOPA 25-100 MG PO TABS: 1.0000 | ORAL_TABLET | Freq: Three times a day (TID) | ORAL | Status: DC

## 2015-08-25 NOTE — Progress Notes (Signed)
[name] was seen today in the movement disorders clinic for neurologic consultation at the request of Tara Bush, MD.  The patient presents today for the evaluation of tremor.  I reviewed Epic records for as far back as they go.  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  She states that this started after a leg fracture on the right and then was later dx with RSD in the leg.  Then, in the year approximately 2000, she fell at work again and she injured the ulnar nerve on the right (no fracture); she was in therapy at the hand center which helped but she was told that she developed RSD in that arm.  Epic notes from November, 2013 mention right-sided tremor that was not apparently new at that time.    She cannot remember exactly when that tremor developed but states that it did develop slowly and has gotten worse.  This apparently was persistent for several years and got worse when her husband died in 89.  Notes also indicate that the patient began to complain about right foot pain and some difficulty with controlling the right foot car pedals in October, 2015.  Over the last month, the patient has noted tremor in the left hand.  Her examinations with her primary care physicians have been somewhat limited by pain and pain medications (long term duragesic that she has required); she has been off of duragesic since may as she is planning to enter a study and needed to be off of the medication for.  Specific Symptoms:  Tremor: Yes.   (as above - right more than L and is right hand dominant) Voice: no change Sleep: trouble staying asleep (since husband died)  Vivid Dreams:  No.  Acting out dreams:  No. Wet Pillows: No. Postural symptoms:  Not if gets up slowly  Falls?  No. Bradykinesia symptoms: slow movements Loss of smell:  No. Loss of taste:  No. Urinary Incontinence:  Yes.   (wears pad) Difficulty  Swallowing:  No. Handwriting, micrographia: Yes.   Trouble with ADL's:  Yes.  , just slower than used to be as has to use L hand to do things  Trouble buttoning clothing: Yes.   Depression:  Yes.   (since husband died in 80) Memory changes:  No. Hallucinations:  No.  visual distortions: No. N/V:  No. Lightheaded:  Yes.    Syncope: Yes.  , a few years ago had virtually weekly (2013) passing out in church but gone away for several years Diplopia:  No. Dyskinesia:  No.  Neuroimaging has previously been performed.  It was a CT brain done in 2011 with contrast and it was negative intracranially but there was a lucency in the right calvarium.  There is no family hx of tremor.    PREVIOUS MEDICATIONS: none to date  ALLERGIES:   Allergies  Allergen Reactions  . Amitriptyline Other (See Comments)    Sedated next morning  . Ciprofloxacin Nausea And Vomiting  . Cymbalta [Duloxetine Hcl] Other (See Comments)    Worsened depression  . Imipramine Hcl     REACTION: rash  . Iohexol      Code: HIVES, Desc: PT developed 2 hives, followed by SOB, severe headache post 87cc's Omnipaque 300., Onset Date: [id number]   . Lyrica [Pregabalin] Other (See Comments)    Tried during hospitalization - unsure effects but unable to tolerate  .  Morphine Sulfate     REACTION: rash  . Sulfamethoxazole     REACTION: rash  . Lidocaine Hcl Rash  . Tetracyclines & Related Rash    CURRENT MEDICATIONS:  Outpatient Encounter Prescriptions as of 08/25/2015  Medication Sig  . Calcium Carb-Cholecalciferol (CALCIUM + D3) 600-200 MG-UNIT TABS Take 2 tablets by mouth daily.  . Capsaicin 0.035 % CREA Apply topically to right leg for 5 days.  . cephALEXin (KEFLEX) 500 MG capsule Take 1 capsule (500 mg total) by mouth 2 (two) times daily.  . cholecalciferol (VITAMIN D) 1000 UNITS tablet Take 1,000 Units by mouth daily.  . clonazePAM (KLONOPIN) 0.5 MG tablet TAKE 1 TABLET BY MOUTH TWICE A DAY AS NEEDED  . conjugated  estrogens (PREMARIN) vaginal cream Place 1 Applicatorful vaginally daily.  . cyanocobalamin 1000 MCG/ML injection Inject 1,000 mcg into the muscle every 8 (eight) weeks.   . gabapentin (NEURONTIN) 100 MG capsule Take 1 capsule (100 mg total) by mouth 2 (two) times daily.  Marland Kitchen latanoprost (XALATAN) 0.005 % ophthalmic solution Place 1 drop into both eyes at bedtime.  Marland Kitchen omeprazole (PRILOSEC) 40 MG capsule TAKE ONE CAPSULE BY MOUTH EVERY DAY  . polyethylene glycol (GLYCOLAX) packet Take 17 g by mouth as needed.    . senna (SENOKOT) 8.6 MG tablet Take 1 tablet by mouth daily.    . sertraline (ZOLOFT) 25 MG tablet TAKE 1 TABLET (25 MG TOTAL) BY MOUTH DAILY.  . traMADol (ULTRAM) 50 MG tablet Take 1 tablet (50 mg total) by mouth 3 (three) times daily as needed.  . triamcinolone cream (KENALOG) 0.5 % APPLY TOPICALLY 3 (THREE) TIMES DAILY.  Marland Kitchen trimethoprim (TRIMPEX) 100 MG tablet Take 100 mg by mouth daily. UTI ppx  . Zoledronic Acid (RECLAST IV) Inject into the vein. Yearly, 06/2013  . carbidopa-levodopa (SINEMET IR) 25-100 MG tablet Take 1 tablet by mouth 3 (three) times daily.  . [DISCONTINUED] pentosan polysulfate (ELMIRON) 100 MG capsule Take 200 mg by mouth 2 (two) times daily.   No facility-administered encounter medications on file as of 08/25/2015.    PAST MEDICAL HISTORY:   Past Medical History  Diagnosis Date  . B12 DEFICIENCY 05/03/2007  . FIBROMYALGIA 05/03/2007  . ANEMIA-NOS 09/25/2007  . HYPERLIPIDEMIA 12/19/2007  . HYPOTENSION, ORTHOSTATIC 12/06/2008  . OSTEOPOROSIS 08/2009    bisphosphonate on hold 2/2 dysphagia, on reclast done in August each year  . 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 GERD 02/22/2010  . Interstitial cystitis     Ottelin now Dr [name]  . CAD (coronary artery disease) 01/2010    MI, [name]  . History of CHF (congestive heart failure) 01/2010  . Cardiomyopathy 01/2010  . History of colon polyps 2004  . Takotsubo cardiomyopathy 2008    due to E  coli urosepsis  . Glaucoma 02/2013    [name]  . 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 Lupus (systemic lupus erythematosus) 02/08/2010    PAST SURGICAL HISTORY:   Past Surgical History  Procedure Laterality Date  . Cholecystectomy      complication - low blood pressure  . Abdominal hysterectomy  1970s    IUD infection - first partial then with oophorectomy (cysts), complication - low blood pressure  . Colonoscopy  06/2008    h/o polyps but latest WNL, rec rpt 10 yrs Olevia Perches)  . Dexa  04/2013    T -2.9 @ femur, -1.6 @ spine  . Cystoscopy  12/2013  abx treatment for recurrent cystitis    SOCIAL HISTORY:   Social History   Social History  . Marital Status: Married    Spouse Name: N/A  . Number of Children: N/A  . Years of Education: N/A   Occupational History  . Not on file.   Social History Main Topics  . Smoking status: Never Smoker   . Smokeless tobacco: Never Used  . Alcohol Use: No  . Drug Use: No  . Sexual Activity: Not on file   Other Topics Concern  . Not on file   Social History Narrative   Widow - husband [name]) passed away 06-Mar-2013.     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 Father Deceased     MI, prostate cancer  . Mother Deceased     esophageal cancer  . Brother Deceased     x2 - cancers  . Brother Clinical research associate  . Sister Alive     x4 - 1 dementia    ROS:  A complete 10 system review of systems was obtained and was unremarkable apart from what is mentioned above.  PHYSICAL EXAMINATION:    VITALS:   Filed Vitals:   08/25/15 0812  BP: 90/60  Pulse: 80  Height: 5\' 5"  (1.651 m)  Weight: 119 lb (53.978 kg)    GEN:  The patient appears stated age and is in NAD. HEENT:  Normocephalic,  atraumatic.  The mucous membranes are moist. The superficial temporal arteries are without ropiness or tenderness. CV:  RRR Lungs:  CTAB Neck/HEME:  There are no carotid bruits bilaterally.  Neurological examination:  Orientation: The patient is alert and oriented x3. Fund of knowledge is appropriate.  Recent and remote memory are intact.  Attention and concentration are normal.    Able to name objects and repeat phrases. Cranial nerves: There is good facial symmetry.  She does have facial hypomimia.   Pupils are equal round and reactive to light bilaterally. Fundoscopic exam is attempted but the disc margins are not well visualized bilaterally. Extraocular muscles are intact. The visual fields are full to confrontational testing. The speech is fluent and clear.   She is hypophonic.  Minimal trouble with gutteral sounds.  Soft palate rises symmetrically and there is no tongue deviation. Hearing is intact to conversational tone. Sensation: Sensation is intact to light and pinprick throughout (facial, trunk, extremities). Vibration is intact at the bilateral big toe. There is no extinction with double simultaneous stimulation. There is no sensory dermatomal level identified. Motor:  There is at least antigravity strength x 4.  There is give way weakness diffusely but MMT is limited by pain especially on the right.   Shoulder shrug is equal and symmetric.  There is no pronator drift. Deep tendon reflexes: Deep tendon reflexes are 2/4 at the bilateral biceps, triceps, brachioradialis, patella and achilles. Plantar responses are downgoing bilaterally.  Movement examination: Tone: There is marked increased tone in the RUE and moderate increased tone in the RLE.  There is mild increased tone in the LUE and normal in the LLE.  Abnormal movements: There is a near constant resting tremor of the R>LUE resting tremor and also intermittent RLE resting tremor Coordination:  There  is decremation with RAM's, with  any form of RAMS, including alternating supination and pronation of the forearm, hand opening and closing, finger taps, heel taps and toe taps, right much more so than L Gait and Station: The patient has significant difficulty arising out of a deep-seated chair without the use of the hands and despite multiple attempts is unable to do so.  She ultimately pushes out of the chair.  The patient's stride length is decreased with almost no arm swing.  She is unstable  ASSESSMENT/PLAN:  1.  Idiopathic Parkinson's disease.  The patient has tremor, bradykinesia, rigidity and postural instability.  This was diagnosed today, 08/25/15, but based on records and pt reports I suspect that she has had this at least since 2013.  -We discussed the diagnosis as well as pathophysiology of the disease.  We discussed treatment options as well as prognostic indicators.  Patient education was provided.  -Greater than 50% of the 60 minute visit was spent in counseling answering questions and talking about what to expect now as well as in the future.  We talked about medication options as well as potential future surgical options.  We talked about safety in the home.  -We decided to add carbidopa/levodopa 25/100.  1/2 tab tid x 1 wk, then 1/2 in am & noon & 1 at night for a week, then 1/2 in am &1 at noon &night for a week, then 1 po tid.  Risks, benefits, side effects and alternative therapies were discussed.  The opportunity to ask questions was given and they were answered to the best of my ability.  The patient expressed understanding and willingness to follow the outlined treatment protocols.  -I will refer the patient for LSVT at [facility]  -We discussed community resources in the area including patient support groups and community exercise programs for PD and pt education was provided to the patient. 2.  ? RSD  -While the patient certainly may have RSD and may have had a history of RSD, I told her that I feel pretty  confident that most of her complaints today were a result of untreated PD. 3.  Follow up is anticipated in the next few months, sooner should new neurologic issues arise.

## 2015-08-25 NOTE — Patient Instructions (Addendum)
1. Start Carbidopa Levodopa as follows: 1/2 tab three times a day before meals x 1 wk, then 1/2 in am & noon & 1 in evening for a week, then 1/2 in am &1 at noon &one in evening for a week, then 1 tablet three times a day before meals. [facility] invites all individuals and families living with Parkinson's to our monthly Parkinson's Support Group. Join [place] every first Thursday at 10:30. Feel free to bring a sandwich or bag lunch. [facility] will provide beverages. Please call Donald Siva at [phone] for information. [place], [place], [place] [address] 3. You have been referred to [facility] [place]. They are located at [address], [place], [place] [address]. They will call you directly to schedule an appointment.  Please call [phone] if you do not hear from them.

## 2015-08-25 NOTE — Addendum Note (Signed)
Addended by: [name] on: 08/25/2015 09:41 AM   Modules accepted: Miquel Dunn

## 2015-08-26 LAB — URINE CULTURE: Colony Count: >=100000

## 2015-09-23 ENCOUNTER — Other Ambulatory Visit: Payer: Self-pay | Admitting: Family Medicine

## 2015-09-24 NOTE — Telephone Encounter (Signed)
plz phone in. 

## 2015-09-24 NOTE — Telephone Encounter (Signed)
Ok to refill 

## 2015-09-27 NOTE — Telephone Encounter (Signed)
Rx called in as directed.   

## 2015-10-01 ENCOUNTER — Other Ambulatory Visit: Payer: Self-pay | Admitting: Family Medicine

## 2015-10-18 ENCOUNTER — Ambulatory Visit: Payer: Medicare Other | Attending: Neurology | Admitting: Occupational Therapy

## 2015-10-18 ENCOUNTER — Encounter: Payer: Self-pay | Admitting: Occupational Therapy

## 2015-10-18 DIAGNOSIS — R262 Difficulty in walking, not elsewhere classified: Secondary | ICD-10-CM | POA: Insufficient documentation

## 2015-10-18 DIAGNOSIS — R2681 Unsteadiness on feet: Secondary | ICD-10-CM | POA: Insufficient documentation

## 2015-10-18 DIAGNOSIS — M6281 Muscle weakness (generalized): Secondary | ICD-10-CM | POA: Insufficient documentation

## 2015-10-18 DIAGNOSIS — R279 Unspecified lack of coordination: Secondary | ICD-10-CM | POA: Diagnosis present

## 2015-10-18 DIAGNOSIS — R460 Very low level of personal hygiene: Secondary | ICD-10-CM | POA: Diagnosis present

## 2015-10-18 DIAGNOSIS — Z741 Need for assistance with personal care: Secondary | ICD-10-CM

## 2015-10-19 NOTE — Therapy (Signed)
[place] MAIN [facility] SERVICES [address] [place], [place], [address] Phone: [phone]   Fax:  [phone]  Occupational Therapy Evaluation  Patient Details  Name: [name] MRN: [id number] Date of Birth: 1956-08-01 No Data Recorded  Encounter Date: 10/18/2015      OT End of Session - 10/18/15 1958    Visit Number 1   Number of Visits 17   Date for OT Re-Evaluation 11/22/15   OT Start Time 0955   OT Stop Time 1100   OT Time Calculation (min) 65 min   Activity Tolerance Patient tolerated treatment well   Behavior During Therapy [facility] for tasks assessed/performed      Past Medical History  Diagnosis Date  . B12 DEFICIENCY 05/03/2007  . FIBROMYALGIA 05/03/2007  . ANEMIA-NOS 09/25/2007  . HYPERLIPIDEMIA 12/19/2007  . HYPOTENSION, ORTHOSTATIC 12/06/2008  . OSTEOPOROSIS 08/2009    bisphosphonate on hold 2/2 dysphagia, on reclast done in August each year  . 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 GERD 02/22/2010  . Interstitial cystitis     Ottelin now Dr Amalia Hailey  . CAD (coronary artery disease) 01/2010    MI, Nishan  . History of CHF (congestive heart failure) 01/2010  . Cardiomyopathy ([place]) 01/2010  . History of colon polyps 2004  . Takotsubo cardiomyopathy 2008    due to E coli urosepsis  . Glaucoma 02/2013    [facility]  . MCTD (mixed connective tissue disease) ([place]) 02/08/2010  . Lupus (systemic lupus erythematosus) ([place]) 02/08/2010  . Parkinson's disease ([place]) 08/25/2015    Dx Dr [name] 07/2015     Past Surgical History  Procedure Laterality Date  . Cholecystectomy      complication - low blood pressure  . Abdominal hysterectomy  1970s    IUD infection - first partial then with oophorectomy (cysts), complication - low blood pressure  . Colonoscopy  06/2008    h/o polyps but latest WNL, rec rpt 10 yrs Olevia Perches)  . Dexa  04/2013    T -2.9 @ femur, -1.6 @ spine  . Cystoscopy  12/2013    abx treatment for  recurrent cystitis    There were no vitals filed for this visit.  Visit Diagnosis:  Muscle weakness (generalized) - Plan: Ot plan of care cert/re-cert  Lack of coordination - Plan: Ot plan of care cert/re-cert  Difficulty walking - Plan: Ot plan of care cert/re-cert  Unsteady gait - Plan: Ot plan of care cert/re-cert  Self-care deficit for bathing and hygiene - Plan: Ot plan of care cert/re-cert      Subjective Assessment - 10/18/15 1948    Subjective  Patient reports she was recently diagnosed with Parkinson's disease about 6 weeks ago.  She reports tremors in her right arm and leg worse than left but mild tremors present on left.     Patient Stated Goals Patient reports she wants to be independent, get better at basic tasks.    Currently in Pain? Yes   Pain Score 3    Pain Location Arm   Pain Orientation Right   Pain Descriptors / Indicators Aching   Multiple Pain Sites No           [facility] OT Assessment - 10/19/15 0001    Assessment   Diagnosis Parkinson's disease   Onset Date 08/06/15   Prior Therapy none   Balance Screen   Has the patient fallen in the past 6 months  No  has stumbled several occasions   Has the patient had a decrease in activity level because of a fear of falling?  No   Is the patient reluctant to leave their home because of a fear of falling?  No   Home  Environment   Family/patient expects to be discharged to: Private residence   Living Arrangements Children   Available Help at [place] Access Level entry   [place] One level   Bathroom Shower/Tub Tub/Shower unit;Curtain   Counselling psychologist - 2 wheels;Cane - single point;Shower seat;Bedside commode;Wheelchair - manual   Lives With Daughter  lives with 2 daughters and 2 grandchildren   Prior Function   Level of Independence Independent with basic ADLs;Needs assistance with homemaking   Vocation Retired   ADL    Eating/Feeding Needs assist with cutting food   Grooming Minimal assistance  difficulty with hair care   Upper Body Bathing Modified independent   Lower Body Bathing Increased time   Upper Body Dressing Needs assist for fasteners;Increased time   Lower Body Dressing Increased time;Needs assist for fasteners   Toilet Tranfer Modified independent   Toileting -  Hygiene Modified Independent   Tub/Shower Transfer Modified independent  uses tub bench   IADL   Prior Level of Function Shopping independent   Shopping Needs to be accompanied on any shopping trip   Prior Level of Function Light Housekeeping independent   Light Housekeeping Performs light daily tasks such as dishwashing, bed making   Prior Level of Function Meal Prep independent   Meal Prep Able to complete simple warm meal prep   Community Mobility Drives own vehicle   Medication Management Is responsible for taking medication in correct dosages at correct time   Prior Level of Function Financial Management independent   Physiological scientist financial matters independently (budgets, writes checks, pays rent, bills goes to bank), collects and keeps track of income   Mobility   Mobility Status Freezing;Independent   Mobility Status Comments ambulates without AD   Written Expression   Handwriting Mild micrographia;Increased time;50% legible   Sensation   Light Touch Appears Intact   Coordination   Gross Motor Movements are Fluid and Coordinated No   Fine Motor Movements are Fluid and Coordinated No   9 Hole Peg Test Right;Left   Right 9 Hole Peg Test 53 secs   Left 9 Hole Peg Test 30 secs   AROM   Overall AROM  Deficits   Overall AROM Comments Right UE 90 degrees of shoulder flexion, -40 degrees of elbow extension, right hand stiffness with difficulty fisting at times.    LUE  full ROM   PROM   Overall PROM  Deficits   Overall PROM Comments limited by pain on right UE for PROM,   Strength   Overall Strength  Deficits   Overall Strength Comments RUE lacks full ROM and strength grossly 2/5, LUE 3/5   Hand Function   Right Hand Grip (lbs) 3#   Right Hand Lateral Pinch 4 lbs   Right Hand 3 Point Pinch 3 lbs   Left Hand Grip (lbs) 8   Left Hand Lateral Pinch 7 lbs   Left 3 point pinch 5 lbs                         OT Education - 10/19/15 1957  Education provided Yes   Education Details LSVT BIG program   Person(s) Educated Patient   Methods Explanation   Comprehension Verbalized understanding             OT Long Term Goals - 10/19/15 2006    OT LONG TERM GOAL #1   Title Patient will improve gait speed and endurance and be able to ambulate 500 feet in 6 minutes to negotiate around the home and community safely in 4 weeks.   Baseline 370 feet at evaluation   Time 4   Period Weeks   Status New   OT LONG TERM GOAL #2   Title Patient will complete HEP for maximal daily exercises with modified independence in 4 weeks   Baseline no current program to address exercises at home   Time 4   Status New   OT LONG TERM GOAL #3   Title Patient will transfer from sit to stand without the use of arms safely and independently from a variety of chairs/surfaces in 4 weeks.   Baseline unable to complete sit to stand without the use of arms at eval   Time 4   Period Weeks   Status New   OT LONG TERM GOAL #4   Title Patient will decrease frequency of freezing episodes with score of 9 or less on Freezing of Gait questionairre   Baseline score of 11 at eval   Time 4   Period Weeks   Status New   OT LONG TERM GOAL #5   Title Patient will complete basic self care tasks with modified independence.   Time 4   Period Weeks   Status New   Long Term Additional Goals   Additional Long Term Goals Yes   OT LONG TERM GOAL #6   Title Patient will complete handwriting of name, address and phone number with 75% legibility and without signs of micrographia.   Time 4   Period Weeks    Status New               Plan - 10/18/15 1959    Clinical Impression Statement Patient is a 69 yo female diagnosed with Parkinson's disease and was referred by her physician for LSVT BIG program. Patient presents with decreased step length with gait patterns, absent reciprocal arm swing, decreased balance, occasional hesitation and freezing of gait with initiation of gait and turns, decreased coordination, and muscle weakness which affect her ability to perform daily tasks. The patient is judged to be an excellent candidate for the LSVT BIG program. She would benefit from and was referred for the LSVT BIG program which is an intensive program designed specifically for Parkinson's patients with a focus on increasing amplitude and speed of movements, improving self-care and daily tasks and providing patients with daily exercises to improve overall function. It is recommended that the patient receive the LSVT BIG program which is comprised of 16 intensive sessions (4 times per week for 4 weeks, one hour sessions). Prognosis for improvement is good based on her motivation and family support. LSVT BIG has been documented in the literature as efficacious for individuals with Parkinson's disease.    Pt will benefit from skilled therapeutic intervention in order to improve on the following deficits (Retired) Decreased knowledge of use of DME;Impaired flexibility;Pain;Decreased coordination;Decreased mobility;Decreased endurance;Decreased activity tolerance;Decreased range of motion;Decreased strength;Decreased balance;Difficulty walking;Impaired UE functional use   Rehab Potential Good   OT Frequency 4x / week   OT Duration 4 weeks   OT  Treatment/Interventions Self-care/ADL training;Therapeutic exercise;Gait Training;Neuromuscular education;Stair Training;Functional Mobility Training;Patient/family education;DME and/or AE instruction;Balance training   Plan Patient to be seen for 16 treatment sessions  plus evaluation for LSVT BIG protocol   Consulted and Agree with Plan of Care Patient          G-Codes - October 24, 2015 2001-03-03    Functional Assessment Tool Used 6 minute walk test, freezing of gait questionairre, 5 times sit to stand, clinical judgment, ADL assessment   Functional Limitation Mobility: Walking and moving around   Mobility: Walking and Moving Around Current Status [id number]) At least 60 percent but less than 80 percent impaired, limited or restricted   Mobility: Walking and Moving Around Goal Status [id number]) At least 20 percent but less than 40 percent impaired, limited or restricted      Problem List Patient Active Problem List   Diagnosis Date Noted  . Parkinson's disease ([place]) 08/25/2015  . Advanced care planning/counseling discussion 05/26/2015  . Family circumstance 02/23/2015  . Right foot pain 09/15/2014  . Health maintenance examination 05/21/2014  . MDD (major depressive disorder), single episode, moderate ([place]) 11/12/2013  . Dysuria 09/02/2013  . Back pain 05/31/2013  . Medicare annual wellness visit, subsequent 05/20/2013  . HLD (hyperlipidemia) 05/20/2013  . Vitamin D deficiency 05/09/2013  . Tremor 07/15/2012  . Recurrent UTI 01/03/2011  . Syncope 01/03/2011  . RASH-NONVESICULAR 05/11/2010  . GERD 02/22/2010  . Reflex sympathetic dystrophy 02/08/2010  . CARDIOMYOPATHY 02/08/2010  . CAD (coronary artery disease) 01/25/2010  . Osteoporosis 11/10/2009  . HYPOTENSION, ORTHOSTATIC 12/06/2008  . Anemia 09/25/2007  . B12 deficiency 05/03/2007  . Fibromyalgia 05/03/2007   Amy T Tomasita Morrow, OTR/L, CLT  Lovett,Amy Oct 24, 2015, 8:25 PM  [place] MAIN [facility] SERVICES [address] [place], [place], [address] Phone: [phone]   Fax:  [phone]  Name: KERRIANA KAINA MRN: [id number] Date of Birth: 01/08/1946

## 2015-10-25 ENCOUNTER — Encounter: Payer: Self-pay | Admitting: Occupational Therapy

## 2015-10-25 ENCOUNTER — Ambulatory Visit: Payer: Medicare Other | Admitting: Occupational Therapy

## 2015-10-25 DIAGNOSIS — R262 Difficulty in walking, not elsewhere classified: Secondary | ICD-10-CM

## 2015-10-25 DIAGNOSIS — R460 Very low level of personal hygiene: Secondary | ICD-10-CM

## 2015-10-25 DIAGNOSIS — M6281 Muscle weakness (generalized): Secondary | ICD-10-CM | POA: Diagnosis not present

## 2015-10-25 DIAGNOSIS — Z741 Need for assistance with personal care: Secondary | ICD-10-CM

## 2015-10-25 DIAGNOSIS — R279 Unspecified lack of coordination: Secondary | ICD-10-CM

## 2015-10-25 DIAGNOSIS — R2681 Unsteadiness on feet: Secondary | ICD-10-CM

## 2015-10-25 NOTE — Therapy (Signed)
[place] MAIN [facility] SERVICES [address] [place], [place], [address] Phone: [phone]   Fax:  [phone]  Occupational Therapy Treatment  Patient Details  Name: [name] MRN: [id number] Date of Birth: 1945/12/02 No Data Recorded  Encounter Date: 10/25/2015      OT End of Session - 10/25/15 1557    Visit Number 2   Number of Visits 17   Date for OT Re-Evaluation 11/22/15   OT Start Time 1001   OT Stop Time 1100   OT Time Calculation (min) 59 min   Activity Tolerance Patient tolerated treatment well   Behavior During Therapy [facility] for tasks assessed/performed      Past Medical History  Diagnosis Date  . B12 DEFICIENCY 05/03/2007  . FIBROMYALGIA 05/03/2007  . ANEMIA-NOS 09/25/2007  . HYPERLIPIDEMIA 12/19/2007  . HYPOTENSION, ORTHOSTATIC 12/06/2008  . OSTEOPOROSIS 08/2009    bisphosphonate on hold 2/2 dysphagia, on reclast done in August each year  . 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 GERD 02/22/2010  . Interstitial cystitis     Ottelin now Dr Amalia Hailey  . CAD (coronary artery disease) 01/2010    MI, Nishan  . History of CHF (congestive heart failure) 01/2010  . Cardiomyopathy ([place]) 01/2010  . History of colon polyps 2004  . Takotsubo cardiomyopathy 2008    due to E coli urosepsis  . Gl[facility]2013    [facility]  . MCTD (mixed connective tissue disease) ([place]) 02/08/2010  . Lupus (systemic lupus erythematosus) ([place]) 02/08/2010  . Parkinson's disease ([place]) 08/25/2015    Dx Dr [name] 07/2015     Past Surgical History  Procedure Laterality Date  . Cholecystectomy      complication - low blood pressure  . Abdominal hysterectomy  1970s    IUD infection - first partial then with oophorectomy (cysts), complication - low blood pressure  . Colonoscopy  06/2008    h/o polyps but latest WNL, rec rpt 10 yrs Olevia Perches)  . Dexa  04/2013    T -2.9 @ femur, -1.6 @ spine  . Cystoscopy  12/2013    abx treatment for  recurrent cystitis    There were no vitals filed for this visit.  Visit Diagnosis:  Muscle weakness (generalized)  Lack of coordination  Difficulty walking  Unsteady gait  Self-care deficit for bathing and hygiene      Subjective Assessment - 10/25/15 1554    Subjective  Pt reports she had a nice thanksgiving with her family.  Her right side is still hurting some and she is unsure why, planning to go to the doctor in the am and will ask to be assessed.  She denies any falls or injuries to the area, reports she rolled over one night and woke up with pain.     Patient Stated Goals Patient reports she wants to be independent, get better at basic tasks.    Currently in Pain? Yes   Pain Score 3    Pain Location Rib cage   Pain Orientation Right   Pain Descriptors / Indicators Aching   Pain Type Acute pain   Pain Onset 1 to 4 weeks ago   Multiple Pain Sites No                      OT Treatments/Exercises (OP) - 10/25/15 1600    Neurological Re-education Exercises   Other Exercises 1 Patient seen for initial  instruction of LSVT BIG exercises: LSVT Daily Session Maximal Daily Exercises: Sustained movements are designed to rescale the amplitude of movement output for generalization to daily functional activities. Performed as follows for 1 set of 10 repetitions each: Multi directional sustained movements- 1) Floor to ceiling, 2) Side to side. Multi directional Repetitive movements performed in standing and are designed to provide retraining effort needed for sustained muscle activation in tasks Performed as follows: 3) Step and reach forward, 4) Step and Reach Backwards, 5) Step and reach sideways, 6) Rock and reach forward/backward, 7) Rock and reach sideways. Sit to stand from mat table on lowest setting with cues for weight shift, technique and CGA for 5 reps for 2 sets. Patient seen for functional mobility tasks this date with emphasis on gait speed, length of steps with  short distance ambulation. Patient requires cues for BIG movements and cadence.All standing exercises performed this date with CGA to minimal assist, verbal and tactile cues as needed.   Other Exercises 2 Patient seen for functional mobility this date for 200 feet with cues for amplitude of steps and moderate to maximal cues for reciprocal arm swing.                OT Education - 10/25/15 1556    Education provided Yes   Education Details LSVT BIG maximal daily exercises, LSVT BIG program   Person(s) Educated Patient   Methods Explanation;Demonstration;Verbal cues;Tactile cues   Comprehension Verbalized understanding;Returned demonstration;Verbal cues required;Tactile cues required             OT Long Term Goals - 10/19/15 2006    OT LONG TERM GOAL #1   Title Patient will improve gait speed and endurance and be able to ambulate 500 feet in 6 minutes to negotiate around the home and community safely in 4 weeks.   Baseline 370 feet at evaluation   Time 4   Period Weeks   Status New   OT LONG TERM GOAL #2   Title Patient will complete HEP for maximal daily exercises with modified independence in 4 weeks   Baseline no current program to address exercises at home   Time 4   Status New   OT LONG TERM GOAL #3   Title Patient will transfer from sit to stand without the use of arms safely and independently from a variety of chairs/surfaces in 4 weeks.   Baseline unable to complete sit to stand without the use of arms at eval   Time 4   Period Weeks   Status New   OT LONG TERM GOAL #4   Title Patient will decrease frequency of freezing episodes with score of 9 or less on Freezing of Gait questionairre   Baseline score of 11 at eval   Time 4   Period Weeks   Status New   OT LONG TERM GOAL #5   Title Patient will complete basic self care tasks with modified independence.   Time 4   Period Weeks   Status New   Long Term Additional Goals   Additional Long  Term Goals Yes   OT LONG TERM GOAL #6   Title Patient will complete handwriting of name, address and phone number with 75% legibility and without signs of micrographia.   Time 4   Period Weeks   Status New               Plan - 10/25/15 1557    Clinical Impression Statement Initial instruction this date on  LSVT BIG maximal daily exercises, patient requiring moderate to maximal cues both verbal and tactile.  CGA to minimal assistance for balance in standing with exercises this date.  Short rest breaks as needed.  Monitoring of right side pain during exercises.  Patient limited with AROM of right UE with exercises and benefits from therapist assist and use of chair to support arm as needed. Patient will need additional instruction to become proficient with daily exercises.   Pt will benefit from skilled therapeutic intervention in order to improve on the following deficits (Retired) Decreased knowledge of use of DME;Impaired flexibility;Pain;Decreased coordination;Decreased mobility;Decreased endurance;Decreased activity tolerance;Decreased range of motion;Decreased strength;Decreased balance;Difficulty walking;Impaired UE functional use   Rehab Potential Good   OT Frequency 4x / week   OT Duration 4 weeks   OT Treatment/Interventions Self-care/ADL training;Therapeutic exercise;Gait Training;Neuromuscular education;Stair Training;Functional Mobility Training;Patient/family education;DME and/or AE instruction;Balance training   Consulted and Agree with Plan of Care Patient        Problem List Patient Active Problem List   Diagnosis Date Noted  . Parkinson's disease ([place]) 08/25/2015  . Advanced care planning/counseling discussion 05/26/2015  . Family circumstance 02/23/2015  . Right foot pain 09/15/2014  . Health maintenance examination 05/21/2014  . MDD (major depressive disorder), single episode, moderate ([place]) 11/12/2013  . Dysuria 09/02/2013  . Back pain 05/31/2013  . Medicare  annual wellness visit, subsequent 05/20/2013  . HLD (hyperlipidemia) 05/20/2013  . Vitamin D deficiency 05/09/2013  . Tremor 07/15/2012  . Recurrent UTI 01/03/2011  . Syncope 01/03/2011  . RASH-NONVESICULAR 05/11/2010  . GERD 02/22/2010  . Reflex sympathetic dystrophy 02/08/2010  . CARDIOMYOPATHY 02/08/2010  . CAD (coronary artery disease) 01/25/2010  . Osteoporosis 11/10/2009  . HYPOTENSION, ORTHOSTATIC 12/06/2008  . Anemia 09/25/2007  . B12 deficiency 05/03/2007  . Fibromyalgia 05/03/2007   Belanna Manring T Tomasita Morrow, OTR/L, CLT Cordarius Benning 10/25/2015, 4:07 PM  [place] MAIN [facility] SERVICES [address] [place], [place], [address] Phone: [phone]   Fax:  [phone]  Name: DAWNELLA HAPKE MRN: [id number] Date of Birth: 06/29/1946

## 2015-10-26 ENCOUNTER — Other Ambulatory Visit: Payer: Self-pay | Admitting: Family Medicine

## 2015-10-26 ENCOUNTER — Encounter: Payer: Self-pay | Admitting: Neurology

## 2015-10-26 ENCOUNTER — Ambulatory Visit: Payer: Medicare Other | Admitting: Occupational Therapy

## 2015-10-26 ENCOUNTER — Ambulatory Visit (INDEPENDENT_AMBULATORY_CARE_PROVIDER_SITE_OTHER): Payer: Medicare Other | Admitting: Neurology

## 2015-10-26 VITALS — BP 100/62 | HR 76 | Ht 65.0 in | Wt 127.0 lb

## 2015-10-26 DIAGNOSIS — M6281 Muscle weakness (generalized): Secondary | ICD-10-CM | POA: Diagnosis not present

## 2015-10-26 DIAGNOSIS — G20 Parkinson's disease: Secondary | ICD-10-CM | POA: Diagnosis not present

## 2015-10-26 DIAGNOSIS — Z741 Need for assistance with personal care: Secondary | ICD-10-CM

## 2015-10-26 DIAGNOSIS — R279 Unspecified lack of coordination: Secondary | ICD-10-CM

## 2015-10-26 DIAGNOSIS — R2681 Unsteadiness on feet: Secondary | ICD-10-CM

## 2015-10-26 DIAGNOSIS — R460 Very low level of personal hygiene: Secondary | ICD-10-CM

## 2015-10-26 DIAGNOSIS — R262 Difficulty in walking, not elsewhere classified: Secondary | ICD-10-CM

## 2015-10-26 MED ORDER — PRAMIPEXOLE DIHYDROCHLORIDE 0.5 MG PO TABS: 0.5000 mg | ORAL_TABLET | Freq: Three times a day (TID) | ORAL | Status: DC

## 2015-10-26 MED ORDER — CARBIDOPA-LEVODOPA 25-100 MG PO TABS: 1.0000 | ORAL_TABLET | Freq: Three times a day (TID) | ORAL | Status: DC

## 2015-10-26 MED ORDER — PRAMIPEXOLE DIHYDROCHLORIDE 0.125 MG PO TABS: ORAL_TABLET | ORAL | Status: DC

## 2015-10-26 NOTE — Progress Notes (Signed)
[name] was seen today in the movement disorders clinic for neurologic consultation at the request of [name], MD.  The patient presents today for the evaluation of tremor.  I reviewed Epic records for as far back as they go.  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  She states that this started after a leg fracture on the right and then was later dx with RSD in the leg.  Then, in the year approximately 2000, she fell at work again and she injured the ulnar nerve on the right (no fracture); she was in therapy at the hand center which helped but she was told that she developed RSD in that arm.  Epic notes from November, 2013 mention right-sided tremor that was not apparently new at that time.    She cannot remember exactly when that tremor developed but states that it did develop slowly and has gotten worse.  This apparently was persistent for several years and got worse when her husband died in 31.  Notes also indicate that the patient began to complain about right foot pain and some difficulty with controlling the right foot car pedals in October, 2015.  Over the last month, the patient has noted tremor in the left hand.  Her examinations with her primary care physicians have been somewhat limited by pain and pain medications (long term duragesic that she has required); she has been off of duragesic since may as she is planning to enter a study and needed to be off of the medication for.  10/26/15 update:  The patient returns today for follow-up.  She is accompanied by her son who supplements the history.  She was diagnosed with Parkinson's disease last visit, on 08/17/2015 but she has likely had symptoms since 2013.  I started her on carbidopa/levodopa 25/100 and she has worked up to one tablet 3 times per day.  She is stiff first thing in the AM.  She notices tremor when she is nervous or anxious.  Her son  states that she has overall been shaking less.  She denies any falls since our last visit.  She denies hallucinations.  She denies lightheadedness or near syncope.  I did refer her to the neuro rehabilitation center at [facility] for LSVT.   She just started that yesterday.    PREVIOUS MEDICATIONS: none to date  ALLERGIES:   Allergies  Allergen Reactions  . Amitriptyline Other (See Comments)    Sedated next morning  . Ciprofloxacin Nausea And Vomiting  . Cymbalta [Duloxetine Hcl] Other (See Comments)    Worsened depression  . Imipramine Hcl     REACTION: rash  . Iohexol      Code: HIVES, Desc: PT developed 2 hives, followed by SOB, severe headache post 87cc's Omnipaque 300., Onset Date: [id number]   . Lyrica [Pregabalin] Other (See Comments)    Tried during hospitalization - unsure effects but unable to tolerate  . Morphine Sulfate     REACTION: rash  . Sulfamethoxazole     REACTION: rash  . Lidocaine Hcl Rash  . Tetracyclines & Related Rash    CURRENT MEDICATIONS:  Outpatient Encounter Prescriptions as of 10/26/2015  Medication Sig  . Calcium Carb-Cholecalciferol (CALCIUM + D3) 600-200 MG-UNIT TABS Take 2 tablets by mouth daily.  . carbidopa-levodopa (SINEMET IR) 25-100 MG tablet Take 1 tablet by mouth 3 (three) times daily.  . cholecalciferol (VITAMIN D) 1000  UNITS tablet Take 1,000 Units by mouth daily.  . clonazePAM (KLONOPIN) 0.5 MG tablet TAKE 1 TABLET BY MOUTH TWICE A DAY AS NEEDED  . cyanocobalamin 1000 MCG/ML injection Inject 1,000 mcg into the muscle every 8 (eight) weeks.   . gabapentin (NEURONTIN) 100 MG capsule Take 1 capsule (100 mg total) by mouth 2 (two) times daily.  Marland Kitchen latanoprost (XALATAN) 0.005 % ophthalmic solution Place 1 drop into both eyes at bedtime.  Marland Kitchen omeprazole (PRILOSEC) 40 MG capsule TAKE ONE CAPSULE BY MOUTH EVERY DAY  . polyethylene glycol (GLYCOLAX) packet Take 17 g by mouth as needed.    . senna (SENOKOT) 8.6 MG tablet Take 1 tablet by mouth daily.     . sertraline (ZOLOFT) 25 MG tablet TAKE 1 TABLET (25 MG TOTAL) BY MOUTH DAILY.  . traMADol (ULTRAM) 50 MG tablet TAKE 1 TABLET BY MOUTH THREE TIMES DAILY AS NEEDED  . Zoledronic Acid (RECLAST IV) Inject into the vein. Yearly, 06/2013  . [DISCONTINUED] Capsaicin 0.035 % CREA Apply topically to right leg for 5 days.  . [DISCONTINUED] cephALEXin (KEFLEX) 500 MG capsule Take 1 capsule (500 mg total) by mouth 2 (two) times daily.  . [DISCONTINUED] conjugated estrogens (PREMARIN) vaginal cream Place 1 Applicatorful vaginally daily.  . [DISCONTINUED] gabapentin (NEURONTIN) 100 MG capsule Take 1 capsule (100 mg total) by mouth 2 (two) times daily.  . [DISCONTINUED] triamcinolone cream (KENALOG) 0.5 % APPLY TOPICALLY 3 (THREE) TIMES DAILY.  . [DISCONTINUED] trimethoprim (TRIMPEX) 100 MG tablet Take 100 mg by mouth daily. UTI ppx   No facility-administered encounter medications on file as of 10/26/2015.    PAST MEDICAL HISTORY:   Past Medical History  Diagnosis Date  . B12 DEFICIENCY 05/03/2007  . FIBROMYALGIA 05/03/2007  . ANEMIA-NOS 09/25/2007  . HYPERLIPIDEMIA 12/19/2007  . HYPOTENSION, ORTHOSTATIC 12/06/2008  . OSTEOPOROSIS 08/2009    bisphosphonate on hold 2/2 dysphagia, on reclast done in August each year  . 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 GERD 02/22/2010  . Interstitial cystitis     Ottelin now Dr Amalia Hailey  . CAD (coronary artery disease) 01/2010    MI, Nishan  . History of CHF (congestive heart failure) 01/2010  . Cardiomyopathy ([place]) 01/2010  . History of colon polyps 2004  . Takotsubo cardiomyopathy 2008    due to E coli urosepsis  . Glaucoma 02/2013    [facility]  . MCTD (mixed connective tissue disease) ([place]) 02/08/2010  . Lupus (systemic lupus erythematosus) ([place]) 02/08/2010  . Parkinson's disease ([place]) 08/25/2015    Dx Dr [name] 07/2015     PAST SURGICAL HISTORY:   Past Surgical History  Procedure Laterality Date  . Cholecystectomy       complication - low blood pressure  . Abdominal hysterectomy  1970s    IUD infection - first partial then with oophorectomy (cysts), complication - low blood pressure  . Colonoscopy  06/2008    h/o polyps but latest WNL, rec rpt 10 yrs Olevia Perches)  . Dexa  04/2013    T -2.9 @ femur, -1.6 @ spine  . Cystoscopy  12/2013    abx treatment for recurrent cystitis    SOCIAL HISTORY:   Social History   Social History  . Marital Status: Married    Spouse Name: N/A  . Number of Children: N/A  . Years of Education: N/A   Occupational History  . Not on file.   Social History Main Topics  . Smoking  status: Never Smoker   . Smokeless tobacco: Never Used  . Alcohol Use: No  . Drug Use: No  . Sexual Activity: Not on file   Other Topics Concern  . Not on file   Social History Narrative   Widow - husband [name]) passed away Mar 07, 2013.     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 Father Deceased     MI, prostate cancer  . Mother Deceased     esophageal cancer  . Brother Deceased     x2 - cancers  . Brother Clinical research associate  . Sister Alive     x4 - 1 dementia    ROS:  A complete 10 system review of systems was obtained and was unremarkable apart from what is mentioned above.  PHYSICAL EXAMINATION:    VITALS:   Filed Vitals:   10/26/15 0908  BP: 100/62  Pulse: 76  Height: 5\' 5"  (1.651 m)  Weight: 127 lb (57.607 kg)    GEN:  The patient appears stated age and is in NAD. HEENT:  Normocephalic, atraumatic.  The mucous membranes are moist. The superficial temporal arteries are without ropiness or tenderness. CV:  RRR Lungs:  CTAB Neck/HEME:  There are no carotid bruits bilaterally.  Neurological examination:  Orientation: The patient is alert and oriented x3.  Fund of knowledge is appropriate.  Recent and remote memory are intact.  Attention and concentration are normal.    Able to name objects and repeat phrases. Cranial nerves: There is good facial symmetry.  She does have facial hypomimia.   Pupils are equal round and reactive to light bilaterally. Fundoscopic exam is attempted but the disc margins are not well visualized bilaterally. Extraocular muscles are intact. The visual fields are full to confrontational testing. The speech is fluent and clear.   She is hypophonic.  Minimal trouble with gutteral sounds.  Soft palate rises symmetrically and there is no tongue deviation. Hearing is intact to conversational tone. Sensation: Sensation is intact to light and pinprick throughout (facial, trunk, extremities). Vibration is intact at the bilateral big toe. There is no extinction with double simultaneous stimulation. There is no sensory dermatomal level identified. Motor:  There is at least antigravity strength x 4.  There is give way weakness diffusely but MMT is limited by pain especially on the right.   Shoulder shrug is equal and symmetric.  There is no pronator drift. Deep tendon reflexes: Deep tendon reflexes are 2/4 at the bilateral biceps, triceps, brachioradialis, patella and achilles. Plantar responses are downgoing bilaterally.  Movement examination: Tone: There is mod-marked increased tone in the RUE and mild-mod increased tone in the RLE.   Abnormal movements: There is a near constant mild resting tremor of the R>LUE resting tremor.  The RLE resting tremor is gone.   Coordination:  There is decremation with RAM's, with any form of RAMS, including alternating supination and pronation of the forearm, hand opening and closing, finger taps, heel taps and toe taps, right much more so than L Gait and Station: The patient has significant difficulty arising out of a deep-seated chair without the use of the hands and despite multiple attempts is unable to do  so.  She ultimately pushes  out of the chair.  The patient's stride length is decreased with almost no arm swing.  She is less unstable than last visit  ASSESSMENT/PLAN:  1.  Idiopathic Parkinson's disease.  The patient has tremor, bradykinesia, rigidity and postural instability.  This was diagnosed today, 08/25/15, but based on records and pt reports I suspect that she has had this at least since 2013.  -Pt looks better today after being started on meds but still significant rigidity.  I am wondering if some isn't longstanding spasticity from having the disease untreated but am going to add mirapex and work to 0.5 mg tid.  Risks, benefits, side effects and alternative therapies were discussed.  The opportunity to ask questions was given and they were answered to the best of my ability.  The patient expressed understanding and willingness to follow the outlined treatment protocols.  Discussed risk of sleep attacks, compulsive behaviors, etc  -continue carbidopa/levodopa 25/100 tid  -continue with LSVT at [place]  -continue exercising at home on stationary bike 2.  ? RSD  -While the patient certainly may have RSD and may have had a history of RSD, I told her that I feel pretty confident that most of her complaints today were a result of untreated PD. 3.  Follow up is anticipated in the next few months, sooner should new neurologic issues arise.  Much greater than 50% of this visit was spent in counseling with the patient and the family.  Total face to face time:  35 min

## 2015-10-26 NOTE — Patient Instructions (Signed)
1. Continue Carbidopa Levodopa 25/100 - 1 tablet three times daily. Can take with Carbohydrates.  2. Start mirapex (pramipexole) as follows:  0.125 mg - 1 tablet three times per day for a week, then 2 tablets three times per day for a week and then fill the 0.5 mg tablet and take that, 1 pill three times per day

## 2015-10-26 NOTE — Addendum Note (Signed)
Addended byAnnamaria Helling on: 10/26/2015 10:26 AM   Modules accepted: Orders

## 2015-10-27 ENCOUNTER — Encounter: Payer: Self-pay | Admitting: Occupational Therapy

## 2015-10-27 ENCOUNTER — Ambulatory Visit: Payer: Medicare Other | Admitting: Occupational Therapy

## 2015-10-27 ENCOUNTER — Other Ambulatory Visit: Payer: Self-pay | Admitting: Family Medicine

## 2015-10-27 DIAGNOSIS — R2681 Unsteadiness on feet: Secondary | ICD-10-CM

## 2015-10-27 DIAGNOSIS — M6281 Muscle weakness (generalized): Secondary | ICD-10-CM | POA: Diagnosis not present

## 2015-10-27 DIAGNOSIS — R460 Very low level of personal hygiene: Secondary | ICD-10-CM

## 2015-10-27 DIAGNOSIS — R262 Difficulty in walking, not elsewhere classified: Secondary | ICD-10-CM

## 2015-10-27 DIAGNOSIS — R279 Unspecified lack of coordination: Secondary | ICD-10-CM

## 2015-10-27 DIAGNOSIS — Z741 Need for assistance with personal care: Secondary | ICD-10-CM

## 2015-10-27 NOTE — Therapy (Signed)
[place] MAIN [facility] SERVICES [address] [place], [place], [address] Phone: [phone]   Fax:  [phone]  Occupational Therapy Treatment  Patient Details  Name: [name] MRN: [id number] Date of Birth: 07-21-1946 No Data Recorded  Encounter Date: 10/26/2015      OT End of Session - 10/27/15 1434    Visit Number 3   Number of Visits 17   Date for OT Re-Evaluation 11/22/15   OT Start Time 1100   OT Stop Time 1159   OT Time Calculation (min) 59 min   Activity Tolerance Patient tolerated treatment well   Behavior During Therapy [facility] for tasks assessed/performed      Past Medical History  Diagnosis Date  . B12 DEFICIENCY 05/03/2007  . FIBROMYALGIA 05/03/2007  . ANEMIA-NOS 09/25/2007  . HYPERLIPIDEMIA 12/19/2007  . HYPOTENSION, ORTHOSTATIC 12/06/2008  . OSTEOPOROSIS 08/2009    bisphosphonate on hold 2/2 dysphagia, on reclast done in August each year  . 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 GERD 02/22/2010  . Interstitial cystitis     Ottelin now Dr Amalia Hailey  . CAD (coronary artery disease) 01/2010    MI, Nishan  . History of CHF (congestive heart failure) 01/2010  . Cardiomyopathy ([place]) 01/2010  . History of colon polyps 2004  . Takotsubo cardiomyopathy 2008    due to E coli urosepsis  . Glaucoma 02/2013    [facility]  . MCTD (mixed connective tissue disease) ([place]) 02/08/2010  . Lupus (systemic lupus erythematosus) ([place]) 02/08/2010  . Parkinson's disease ([place]) 08/25/2015    Dx Dr [name] 07/2015     Past Surgical History  Procedure Laterality Date  . Cholecystectomy      complication - low blood pressure  . Abdominal hysterectomy  1970s    IUD infection - first partial then with oophorectomy (cysts), complication - low blood pressure  . Colonoscopy  06/2008    h/o polyps but latest WNL, rec rpt 10 yrs Olevia Perches)  . Dexa  04/2013    T -2.9 @ femur, -1.6 @ spine  . Cystoscopy  12/2013    abx treatment for  recurrent cystitis    There were no vitals filed for this visit.  Visit Diagnosis:  Muscle weakness (generalized)  Lack of coordination  Difficulty walking  Unsteady gait  Self-care deficit for bathing and hygiene      Subjective Assessment - 10/27/15 1002    Patient Stated Goals (p) Patient reports she wants to be independent, get better at basic tasks.    Currently in Pain? (p) Yes   Pain Score (p) 2    Pain Location (p) Rib cage   Pain Orientation (p) Right   Pain Descriptors / Indicators (p) Aching   Pain Type (p) Acute pain                      OT Treatments/Exercises (OP) - 10/26/15 1443    Neurological Re-education Exercises   Other Exercises 1 Patient seen for  LSVT BIG exercises: LSVT Daily Session Maximal Daily Exercises: Sustained movements are designed to rescale the amplitude of movement output for generalization to daily functional activities. Performed as follows for 1 set of 10 repetitions each: Multi directional sustained movements- 1) Floor to ceiling, 2) Side to side. Multi directional Repetitive movements performed in standing and are designed to provide retraining effort needed for sustained muscle activation in tasks Performed as follows: 3)  Step and reach forward, 4) Step and Reach Backwards, 5) Step and reach sideways, 6) Rock and reach forward/backward, 7) Rock and reach sideways. Sit to stand from mat table on lowest setting with cues for weight shift, technique and CGA for 5 reps for 2 sets. Patient seen for functional mobility tasks this date with emphasis on gait speed, length of steps with short distance ambulation. Patient requires cues for BIG movements and cadence.All standing exercises performed this date with CGA to minimal assist, verbal and tactile cues as needed.   Other Exercises 2 Stair negotiation, 5 steps cues for reciprocal stepping patterns and weight shifting while holding onto both handrails.  Functional moblility skills  this date for 400 feet with cues for reciprocal arm swing and amplitude of gait.                  OT Education - 10/27/15 1434    Education provided Yes   Education Details LSVT BIG exercises   Person(s) Educated Patient   Methods Explanation;Demonstration;Verbal cues   Comprehension Verbal cues required;Returned demonstration;Verbalized understanding             OT Long Term Goals - 10/19/15 2006    OT LONG TERM GOAL #1   Title Patient will improve gait speed and endurance and be able to ambulate 500 feet in 6 minutes to negotiate around the home and community safely in 4 weeks.   Baseline 370 feet at evaluation   Time 4   Period Weeks   Status New   OT LONG TERM GOAL #2   Title Patient will complete HEP for maximal daily exercises with modified independence in 4 weeks   Baseline no current program to address exercises at home   Time 4   Status New   OT LONG TERM GOAL #3   Title Patient will transfer from sit to stand without the use of arms safely and independently from a variety of chairs/surfaces in 4 weeks.   Baseline unable to complete sit to stand without the use of arms at eval   Time 4   Period Weeks   Status New   OT LONG TERM GOAL #4   Title Patient will decrease frequency of freezing episodes with score of 9 or less on Freezing of Gait questionairre   Baseline score of 11 at eval   Time 4   Period Weeks   Status New   OT LONG TERM GOAL #5   Title Patient will complete basic self care tasks with modified independence.   Time 4   Period Weeks   Status New   Long Term Additional Goals   Additional Long Term Goals Yes   OT LONG TERM GOAL #6   Title Patient will complete handwriting of name, address and phone number with 75% legibility and without signs of micrographia.   Time 4   Period Weeks   Status New               Plan - 10/27/15 1435    Clinical Impression Statement Patient able to recall a portion of the exercises from last  session but still required CGA to minimal assist to complete.  Right arm limited with ROM but appeared improved this date with exercises and cues for working towards extending arm out for many of the daily exercises.  Patient demonstrated difficulty with sit to stand without the use of arms but after therapist provided min assist and patient completed 5 reps, she was able  to complete 5 more reps with moderate verbal cues and CGA.   Pt will benefit from skilled therapeutic intervention in order to improve on the following deficits (Retired) Decreased knowledge of use of DME;Impaired flexibility;Pain;Decreased coordination;Decreased mobility;Decreased endurance;Decreased activity tolerance;Decreased range of motion;Decreased strength;Decreased balance;Difficulty walking;Impaired UE functional use   Rehab Potential Good   OT Frequency 4x / week   OT Duration 4 weeks   OT Treatment/Interventions Self-care/ADL training;Therapeutic exercise;Gait Training;Neuromuscular education;Stair Training;Functional Mobility Training;Patient/family education;DME and/or AE instruction;Balance training   Consulted and Agree with Plan of Care Patient        Problem List Patient Active Problem List   Diagnosis Date Noted  . Parkinson's disease ([place]) 08/25/2015  . Advanced care planning/counseling discussion 05/26/2015  . Family circumstance 02/23/2015  . Right foot pain 09/15/2014  . Health maintenance examination 05/21/2014  . MDD (major depressive disorder), single episode, moderate ([place]) 11/12/2013  . Dysuria 09/02/2013  . Back pain 05/31/2013  . Medicare annual wellness visit, subsequent 05/20/2013  . HLD (hyperlipidemia) 05/20/2013  . Vitamin D deficiency 05/09/2013  . Tremor 07/15/2012  . Recurrent UTI 01/03/2011  . Syncope 01/03/2011  . RASH-NONVESICULAR 05/11/2010  . GERD 02/22/2010  . Reflex sympathetic dystrophy 02/08/2010  . CARDIOMYOPATHY 02/08/2010  . CAD (coronary artery disease) 01/25/2010   . Osteoporosis 11/10/2009  . HYPOTENSION, ORTHOSTATIC 12/06/2008  . Anemia 09/25/2007  . B12 deficiency 05/03/2007  . Fibromyalgia 05/03/2007   Amy T Tomasita Morrow, OTR/L, CLT Lovett,Amy 10/27/2015, 2:50 PM  [place] MAIN [facility] SERVICES [address] [place], [place], [address] Phone: [phone]   Fax:  [phone]  Name: KENISHA JEFFERY MRN: [id number] Date of Birth: Dec 22, 1945

## 2015-10-27 NOTE — Telephone Encounter (Signed)
Ok to refill 

## 2015-10-28 ENCOUNTER — Ambulatory Visit: Payer: Medicare Other | Attending: Neurology | Admitting: Occupational Therapy

## 2015-10-28 ENCOUNTER — Encounter: Payer: Self-pay | Admitting: Family Medicine

## 2015-10-28 ENCOUNTER — Ambulatory Visit (INDEPENDENT_AMBULATORY_CARE_PROVIDER_SITE_OTHER): Payer: Medicare Other | Admitting: Family Medicine

## 2015-10-28 VITALS — BP 100/64 | HR 78 | Temp 97.6°F | Ht 65.0 in | Wt 129.0 lb

## 2015-10-28 DIAGNOSIS — Z23 Encounter for immunization: Secondary | ICD-10-CM | POA: Diagnosis not present

## 2015-10-28 DIAGNOSIS — E538 Deficiency of other specified B group vitamins: Secondary | ICD-10-CM | POA: Diagnosis not present

## 2015-10-28 DIAGNOSIS — R460 Very low level of personal hygiene: Secondary | ICD-10-CM | POA: Diagnosis present

## 2015-10-28 DIAGNOSIS — M6281 Muscle weakness (generalized): Secondary | ICD-10-CM | POA: Diagnosis not present

## 2015-10-28 DIAGNOSIS — R2681 Unsteadiness on feet: Secondary | ICD-10-CM | POA: Insufficient documentation

## 2015-10-28 DIAGNOSIS — R300 Dysuria: Secondary | ICD-10-CM | POA: Diagnosis not present

## 2015-10-28 DIAGNOSIS — R279 Unspecified lack of coordination: Secondary | ICD-10-CM | POA: Diagnosis present

## 2015-10-28 DIAGNOSIS — R262 Difficulty in walking, not elsewhere classified: Secondary | ICD-10-CM | POA: Diagnosis present

## 2015-10-28 DIAGNOSIS — Z741 Need for assistance with personal care: Secondary | ICD-10-CM

## 2015-10-28 LAB — POCT URINALYSIS DIP (MANUAL ENTRY)
Bilirubin, UA: NEGATIVE
Blood, UA: NEGATIVE
GLUCOSE UA: NEGATIVE
Ketones, POC UA: NEGATIVE
NITRITE UA: NEGATIVE
PROTEIN UA: NEGATIVE
SPEC GRAV UA: 1.02
UROBILINOGEN UA: 0.2
pH, UA: 6.5

## 2015-10-28 MED ORDER — CYANOCOBALAMIN 1000 MCG/ML IJ SOLN
1000.0000 ug | Freq: Once | INTRAMUSCULAR | Status: AC
  Administered 2015-10-28: 1000 ug via INTRAMUSCULAR

## 2015-10-28 NOTE — Addendum Note (Signed)
Addended by: Carter Kitten on: 10/28/2015 03:10 PM   Modules accepted: Orders

## 2015-10-28 NOTE — Assessment & Plan Note (Signed)
Contaminated urine sample on micro in pt with history of recurrent UTI.  Will send for culture.  Push fluids.  Has history of IC which may be causing symptoms as well. Consider follow up with Dr. Rebekah Chesterfield

## 2015-10-28 NOTE — Patient Instructions (Signed)
No definite infection.  Will send for urine culture.  Push fluids, avoid acidic foods that trigger bladder spasm.  Call if fever or symptoms worsening.

## 2015-10-28 NOTE — Therapy (Signed)
[place] MAIN [facility] SERVICES [address] [place], [place], [address] Phone: [phone]   Fax:  [phone]  Occupational Therapy Treatment  Patient Details  Name: [name] MRN: [id number] Date of Birth: [birth year] No Data Recorded  Encounter Date: 10/27/2015      OT End of Session - 10/27/15 2017    Visit Number 4   Number of Visits 17   Date for OT Re-Evaluation 11/22/15   OT Start Time 1000   OT Stop Time 1058   OT Time Calculation (min) 58 min   Activity Tolerance Patient tolerated treatment well   Behavior During Therapy [facility] for tasks assessed/performed      Past Medical History  Diagnosis Date  . B12 DEFICIENCY 05/03/2007  . FIBROMYALGIA 05/03/2007  . ANEMIA-NOS 09/25/2007  . HYPERLIPIDEMIA 12/19/2007  . HYPOTENSION, ORTHOSTATIC 12/06/2008  . OSTEOPOROSIS 08/2009    bisphosphonate on hold 2/2 dysphagia, on reclast done in August each year  . 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 GERD 02/22/2010  . Interstitial cystitis     Ottelin now Dr Amalia Hailey  . CAD (coronary artery disease) 01/2010    MI, Nishan  . History of CHF (congestive heart failure) 01/2010  . Cardiomyopathy ([place]) 01/2010  . History of colon polyps 2004  . Takotsubo cardiomyopathy 2008    due to E coli urosepsis  . Glaucoma 02/2013    [facility]  . MCTD (mixed connective tissue disease) ([place]) 02/08/2010  . Lupus (systemic lupus erythematosus) ([place]) 02/08/2010  . Parkinson's disease ([place]) 08/25/2015    Dx Dr [name] 07/2015     Past Surgical History  Procedure Laterality Date  . Cholecystectomy      complication - low blood pressure  . Abdominal hysterectomy  1970s    IUD infection - first partial then with oophorectomy (cysts), complication - low blood pressure  . Colonoscopy  06/2008    h/o polyps but latest WNL, rec rpt 10 yrs Olevia Perches)  . Dexa  04/2013    T -2.9 @ femur, -1.6 @ spine  . Cystoscopy  12/2013    abx treatment for  recurrent cystitis    There were no vitals filed for this visit.  Visit Diagnosis:  Muscle weakness (generalized)  Lack of coordination  Difficulty walking  Unsteady gait  Self-care deficit for bathing and hygiene                    OT Treatments/Exercises (OP) - 10/27/15 2014    Neurological Re-education Exercises   Other Exercises 1 Patient seen for LSVT BIG exercises: LSVT Daily Session Maximal Daily Exercises: Sustained movements are designed to rescale the amplitude of movement output for generalization to daily functional activities. Performed as follows for 1 set of 10 repetitions each: Multi directional sustained movements- 1) Floor to ceiling, 2) Side to side. Multi directional Repetitive movements performed in standing and are designed to provide retraining effort needed for sustained muscle activation in tasks Performed as follows: 3) Step and reach forward, 4) Step and Reach Backwards, 5) Step and reach sideways, 6) Rock and reach forward/backward, 7) Rock and reach sideways. Sit to stand from mat table on lowest setting with cues for weight shift, technique and CGA for 5 reps for 2 sets. Patient seen for functional mobility tasks this date with emphasis on gait speed, length of steps with short distance ambulation. Patient requires cues for BIG movements and cadence.All  standing exercises performed this date with CGA to minimal assist, verbal and tactile cues as needed.   Other Exercises 2 Stair negotiation, 5 steps cues for reciprocal stepping patterns and weight shifting while holding onto both handrails. Functional moblility skills this date for 400 feet with cues for reciprocal arm swing and amplitude of gait.  Reciprocal tapping of toes on stairs with cues and CGA in standing.  Stair negotiation with CGA and cues for weight shifting with reciprocal step going up.                  OT Education - 10/27/15 1434    Education provided Yes   Education  Details LSVT BIG exercises   Person(s) Educated Patient   Methods Explanation;Demonstration;Verbal cues   Comprehension Verbal cues required;Returned demonstration;Verbalized understanding             OT Long Term Goals - 10/19/15 2006    OT LONG TERM GOAL #1   Title Patient will improve gait speed and endurance and be able to ambulate 500 feet in 6 minutes to negotiate around the home and community safely in 4 weeks.   Baseline 370 feet at evaluation   Time 4   Period Weeks   Status New   OT LONG TERM GOAL #2   Title Patient will complete HEP for maximal daily exercises with modified independence in 4 weeks   Baseline no current program to address exercises at home   Time 4   Status New   OT LONG TERM GOAL #3   Title Patient will transfer from sit to stand without the use of arms safely and independently from a variety of chairs/surfaces in 4 weeks.   Baseline unable to complete sit to stand without the use of arms at eval   Time 4   Period Weeks   Status New   OT LONG TERM GOAL #4   Title Patient will decrease frequency of freezing episodes with score of 9 or less on Freezing of Gait questionairre   Baseline score of 11 at eval   Time 4   Period Weeks   Status New   OT LONG TERM GOAL #5   Title Patient will complete basic self care tasks with modified independence.   Time 4   Period Weeks   Status New   Long Term Additional Goals   Additional Long Term Goals Yes   OT LONG TERM GOAL #6   Title Patient will complete handwriting of name, address and phone number with 75% legibility and without signs of micrographia.   Time 4   Period Weeks   Status New               Plan - 10/27/15 2019    Clinical Impression Statement Patient demonstrates difficulty with weight shifting at times with negotiation of stairs.  Tends to shift weight backwards and uses arms more to pull herself up, patient provided  with cues for weight shifting forwards over toes and to not  pull with arm but to use leg muscles for going up.  Patient demonstrated improvements with ROM of right arm with all exercises.   Pt will benefit from skilled therapeutic intervention in order to improve on the following deficits (Retired) Decreased knowledge of use of DME;Impaired flexibility;Pain;Decreased coordination;Decreased mobility;Decreased endurance;Decreased activity tolerance;Decreased range of motion;Decreased strength;Decreased balance;Difficulty walking;Impaired UE functional use   Rehab Potential Good   OT Frequency 4x / week   OT Duration 4 weeks   OT  Treatment/Interventions Self-care/ADL training;Therapeutic exercise;Gait Training;Neuromuscular education;Stair Training;Functional Mobility Training;Patient/family education;DME and/or AE instruction;Balance training   Consulted and Agree with Plan of Care Patient        Problem List Patient Active Problem List   Diagnosis Date Noted  . Parkinson's disease ([place]) 08/25/2015  . Advanced care planning/counseling discussion 05/26/2015  . Family circumstance 02/23/2015  . Right foot pain 09/15/2014  . Health maintenance examination 05/21/2014  . MDD (major depressive disorder), single episode, moderate ([place]) 11/12/2013  . Dysuria 09/02/2013  . Back pain 05/31/2013  . Medicare annual wellness visit, subsequent 05/20/2013  . HLD (hyperlipidemia) 05/20/2013  . Vitamin D deficiency 05/09/2013  . Tremor 07/15/2012  . Recurrent UTI 01/03/2011  . Syncope 01/03/2011  . RASH-NONVESICULAR 05/11/2010  . GERD 02/22/2010  . Reflex sympathetic dystrophy 02/08/2010  . CARDIOMYOPATHY 02/08/2010  . CAD (coronary artery disease) 01/25/2010  . Osteoporosis 11/10/2009  . HYPOTENSION, ORTHOSTATIC 12/06/2008  . Anemia 09/25/2007  . B12 deficiency 05/03/2007  . Fibromyalgia 05/03/2007   Nely Dedmon T Tomasita Morrow, OTR/L, CLT  Sela Falk 10/28/2015, 8:24 PM  [place] MAIN [facility] SERVICES [address]  [place], [place], [address] Phone: [phone]   Fax:  [phone]  Name: DIYA STANDERFER MRN: [id number] Date of Birth: [birth year]

## 2015-10-28 NOTE — Progress Notes (Signed)
Pre visit review using our clinic review tool, if applicable. No additional management support is needed unless otherwise documented below in the visit note. 

## 2015-10-28 NOTE — Progress Notes (Signed)
Subjective:    Patient ID: [name], female    DOB: 11/25/1946, 69 y.o.   MRN: [id number]  Dysuria  This is a new problem. The current episode started in the past 7 days. The problem occurs every urination. The problem has been gradually worsening. The quality of the pain is described as burning. The pain is moderate. There has been no fever. She is not sexually active. There is a history of pyelonephritis. Associated symptoms include flank pain, frequency and urgency. Associated symptoms comments: Right  CVA pain. She has tried increased fluids for the symptoms. The treatment provided mild relief. Her past medical history is significant for recurrent UTIs. There is no history of catheterization, kidney stones, a single kidney or a urological procedure.  Abdominal Pain Associated symptoms include dysuria and frequency. Pertinent negatives include no fever.  Back Pain Associated symptoms include abdominal pain and dysuria. Pertinent negatives include no fever.   Last UTI in 08/24/2015 Kleibsiella 100,000 sensitive except to ampicillin.  UTI 03/2015 >100k pseudomonas treated with levaquin 250mg  x7d. H/o recurrent UTIs and interstitial cystitis. On elmiron and TMP 100mg  daily.   Treated for resistant klebsiella UTI with 5d CTX IM course (11/10-14/2015). Then treated 10/28/2014 with amox 7d course for strep bovis UTI (10/28/2014).   10/2014 cystourethroscopy Dr Amalia Hailey - with hydrodistension of bladder, instillation with phenazopyridine, and bilateral retrograde pyelography - culture from R renal pelvis grew klebsiella pneumoniae sensitive to rocephin and MRSA (thought contaminant).  Social History /Family History/Past Medical History reviewed and updated if needed.    Review of Systems  Constitutional: Positive for fatigue. Negative for fever.  Gastrointestinal: Positive for abdominal pain.  Genitourinary: Positive for dysuria, urgency, frequency and flank pain.  Musculoskeletal: Positive  for back pain.  Neurological:       Parkinson's       Objective:   Physical Exam  Constitutional: Vital signs are normal. She appears well-developed and well-nourished. She is cooperative.  Non-toxic appearance. She does not appear ill. No distress.  HENT:  Head: Normocephalic.  Right Ear: Hearing, tympanic membrane, external ear and ear canal normal. Tympanic membrane is not erythematous, not retracted and not bulging.  Left Ear: Hearing, tympanic membrane, external ear and ear canal normal. Tympanic membrane is not erythematous, not retracted and not bulging.  Nose: No mucosal edema or rhinorrhea. Right sinus exhibits no maxillary sinus tenderness and no frontal sinus tenderness. Left sinus exhibits no maxillary sinus tenderness and no frontal sinus tenderness.  Mouth/Throat: Uvula is midline, oropharynx is clear and moist and mucous membranes are normal.  Eyes: Conjunctivae, EOM and lids are normal. Pupils are equal, round, and reactive to light. Lids are everted and swept, no foreign bodies found.  Neck: Trachea normal and normal range of motion. Neck supple. Carotid bruit is not present. No thyroid mass and no thyromegaly present.  Cardiovascular: Normal rate, regular rhythm, S1 normal, S2 normal, normal heart sounds, intact distal pulses and normal pulses.  Exam reveals no gallop and no friction rub.   No murmur heard. Pulmonary/Chest: Effort normal and breath sounds normal. No tachypnea. No respiratory distress. She has no decreased breath sounds. She has no wheezes. She has no rhonchi. She has no rales.  Abdominal: Soft. Normal appearance and bowel sounds are normal. There is no hepatosplenomegaly. There is generalized tenderness. There is CVA tenderness.  Bilateral CVA tenderness  Neurological: She is alert.  Skin: Skin is warm, dry and intact. No rash noted.  Psychiatric: Her speech is normal  and behavior is normal. Judgment and thought content normal. Her mood appears not anxious.  Cognition and memory are normal. She does not exhibit a depressed mood.          Assessment & Plan:

## 2015-10-29 ENCOUNTER — Encounter: Payer: Self-pay | Admitting: Occupational Therapy

## 2015-10-29 NOTE — Therapy (Signed)
[place] MAIN [facility] SERVICES [address] [place], [place], [address] Phone: [phone]   Fax:  [phone]  Occupational Therapy Treatment  Patient Details  Name: [name] MRN: [id number] Date of Birth: 1946-04-01 No Data Recorded  Encounter Date: 10/28/2015      OT End of Session - 10/28/15 1603    Visit Number 5   Number of Visits 17   Date for OT Re-Evaluation 11/22/15   OT Start Time 1001   OT Stop Time 1100   OT Time Calculation (min) 59 min   Activity Tolerance Patient tolerated treatment well   Behavior During Therapy [facility] for tasks assessed/performed      Past Medical History  Diagnosis Date  . B12 DEFICIENCY 05/03/2007  . FIBROMYALGIA 05/03/2007  . ANEMIA-NOS 09/25/2007  . HYPERLIPIDEMIA 12/19/2007  . HYPOTENSION, ORTHOSTATIC 12/06/2008  . OSTEOPOROSIS 08/2009    bisphosphonate on hold 2/2 dysphagia, on reclast done in August each year  . 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 GERD 02/22/2010  . Interstitial cystitis     Ottelin now Dr Amalia Hailey  . CAD (coronary artery disease) 01/2010    MI, Nishan  . History of CHF (congestive heart failure) 01/2010  . Cardiomyopathy ([place]) 01/2010  . History of colon polyps 2004  . Takotsubo cardiomyopathy 2008    due to E coli urosepsis  . Glaucoma 02/2013    [facility]  . MCTD (mixed connective tissue disease) ([place]) 02/08/2010  . Lupus (systemic lupus erythematosus) ([place]) 02/08/2010  . Parkinson's disease ([place]) 08/25/2015    Dx Dr [name] 07/2015     Past Surgical History  Procedure Laterality Date  . Cholecystectomy      complication - low blood pressure  . Abdominal hysterectomy  1970s    IUD infection - first partial then with oophorectomy (cysts), complication - low blood pressure  . Colonoscopy  06/2008    h/o polyps but latest WNL, rec rpt 10 yrs Olevia Perches)  . Dexa  04/2013    T -2.9 @ femur, -1.6 @ spine  . Cystoscopy  12/2013    abx treatment for  recurrent cystitis    There were no vitals filed for this visit.  Visit Diagnosis:  Muscle weakness (generalized)  Lack of coordination  Difficulty walking  Unsteady gait  Self-care deficit for bathing and hygiene      Subjective Assessment - 10/28/15 1556    Subjective  Patient reports her right arm kept her up a lot last night, not sure if she over did it with exercises this week or why it started hurting.     Patient Stated Goals Patient reports she wants to be independent, get better at basic tasks.    Currently in Pain? Yes   Pain Score 4    Pain Location Arm   Pain Orientation Right   Pain Descriptors / Indicators Aching   Pain Type Acute pain   Pain Onset Yesterday   Multiple Pain Sites No                      OT Treatments/Exercises (OP) - 10/28/15 1557    Neurological Re-education Exercises   Other Exercises 1 Patient seen for LSVT BIG exercises: LSVT Daily Session Maximal Daily Exercises: Sustained movements are designed to rescale the amplitude of movement output for generalization to daily functional activities. Performed as follows for 1 set of 10 repetitions each: Multi  directional sustained movements- 1) Floor to ceiling, 2) Side to side. Multi directional Repetitive movements performed in standing and are designed to provide retraining effort needed for sustained muscle activation in tasks Performed as follows: 3) Step and reach forward, 4) Step and Reach Backwards, 5) Step and reach sideways, 6) Rock and reach forward/backward, 7) Rock and reach sideways. Sit to stand from mat table on lowest setting with cues for weight shift, technique and CGA for 5 reps for 2 sets. Patient seen for functional mobility tasks this date with emphasis on gait speed, length of steps with short distance ambulation. Patient requires cues for BIG movements and cadence.All standing exercises performed this date with CGA to minimal assist, verbal and tactile cues as needed.    Other Exercises 2 Stair negotiation, 5 steps cues for reciprocal stepping patterns and weight shifting while holding onto both handrails. Functional moblility skills this date for 400 feet with cues for reciprocal arm swing and amplitude of gait. Reciprocal tapping of toes on stairs with cues and CGA in standing. Stair negotiation with CGA and cues for weight shifting with reciprocal step going up.  Modified all exercises this date to compensate for pain in the right UE, issued and instructed on written/pictorial exercises                OT Education - 10/29/15 1602    Education provided Yes   Education Details written/pictorial handouts for LSVT BIG exercises for home program   Person(s) Educated Patient   Methods Explanation;Demonstration;Verbal cues   Comprehension Verbal cues required;Returned demonstration;Verbalized understanding             OT Long Term Goals - 10/19/15 2006    OT LONG TERM GOAL #1   Title Patient will improve gait speed and endurance and be able to ambulate 500 feet in 6 minutes to negotiate around the home and community safely in 4 weeks.   Baseline 370 feet at evaluation   Time 4   Period Weeks   Status New   OT LONG TERM GOAL #2   Title Patient will complete HEP for maximal daily exercises with modified independence in 4 weeks   Baseline no current program to address exercises at home   Time 4   Status New   OT LONG TERM GOAL #3   Title Patient will transfer from sit to stand without the use of arms safely and independently from a variety of chairs/surfaces in 4 weeks.   Baseline unable to complete sit to stand without the use of arms at eval   Time 4   Period Weeks   Status New   OT LONG TERM GOAL #4   Title Patient will decrease frequency of freezing episodes with score of 9 or less on Freezing of Gait questionairre   Baseline score of 11 at eval   Time 4   Period Weeks   Status New   OT LONG TERM GOAL #5   Title Patient will  complete basic self care tasks with modified independence.   Time 4   Period Weeks   Status New   Long Term Additional Goals   Additional Long Term Goals Yes   OT LONG TERM GOAL #6   Title Patient will complete handwriting of name, address and phone number with 75% legibility and without signs of micrographia.   Time 4   Period Weeks   Status New  Plan - 10/29/15 1603    Clinical Impression Statement Patient limited by some pain in her right arm this date, this is the arm she previously was diagnosed with RSD and has times when the arm bothers her more, will continue to monitor pain and use and progress as she tolerates with incorporating arm into daily exercises.  Cues this date for weight shifting with exercises in standing as well as  with stair negotiation.     Pt will benefit from skilled therapeutic intervention in order to improve on the following deficits (Retired) Decreased knowledge of use of DME;Impaired flexibility;Pain;Decreased coordination;Decreased mobility;Decreased endurance;Decreased activity tolerance;Decreased range of motion;Decreased strength;Decreased balance;Difficulty walking;Impaired UE functional use   Rehab Potential Good   OT Frequency 4x / week   OT Duration 4 weeks   OT Treatment/Interventions Self-care/ADL training;Therapeutic exercise;Gait Training;Neuromuscular education;Stair Training;Functional Mobility Training;Patient/family education;DME and/or AE instruction;Balance training   Consulted and Agree with Plan of Care Patient        Problem List Patient Active Problem List   Diagnosis Date Noted  . Parkinson's disease ([place]) 08/25/2015  . Advanced care planning/counseling discussion 05/26/2015  . Family circumstance 02/23/2015  . Right foot pain 09/15/2014  . Health maintenance examination 05/21/2014  . MDD (major depressive disorder), single episode, moderate ([place]) 11/12/2013  . Dysuria 09/02/2013  . Back pain 05/31/2013   . Medicare annual wellness visit, subsequent 05/20/2013  . HLD (hyperlipidemia) 05/20/2013  . Vitamin D deficiency 05/09/2013  . Tremor 07/15/2012  . Recurrent UTI 01/03/2011  . Syncope 01/03/2011  . RASH-NONVESICULAR 05/11/2010  . GERD 02/22/2010  . Reflex sympathetic dystrophy 02/08/2010  . CARDIOMYOPATHY 02/08/2010  . CAD (coronary artery disease) 01/25/2010  . Osteoporosis 11/10/2009  . HYPOTENSION, ORTHOSTATIC 12/06/2008  . Anemia 09/25/2007  . B12 deficiency 05/03/2007  . Fibromyalgia 05/03/2007   Montray Kliebert T Tomasita Morrow, OTR/L, CLT  Ethyn Schetter 10/29/2015, 4:07 PM  [place] MAIN [facility] SERVICES [address] [place], [place], [address] Phone: [phone]   Fax:  [phone]  Name: GENIEVE SONNIER MRN: [id number] Date of Birth: 05/01/1946

## 2015-10-29 NOTE — Telephone Encounter (Signed)
plz phone in. 

## 2015-10-29 NOTE — Telephone Encounter (Signed)
Rx called in as directed.   

## 2015-10-30 LAB — URINE CULTURE: Colony Count: >=100000

## 2015-11-01 ENCOUNTER — Encounter: Payer: Self-pay | Admitting: Occupational Therapy

## 2015-11-01 ENCOUNTER — Ambulatory Visit: Payer: Medicare Other | Admitting: Occupational Therapy

## 2015-11-01 ENCOUNTER — Telehealth: Payer: Self-pay | Admitting: Family Medicine

## 2015-11-01 DIAGNOSIS — R262 Difficulty in walking, not elsewhere classified: Secondary | ICD-10-CM

## 2015-11-01 DIAGNOSIS — M6281 Muscle weakness (generalized): Secondary | ICD-10-CM

## 2015-11-01 DIAGNOSIS — R460 Very low level of personal hygiene: Secondary | ICD-10-CM

## 2015-11-01 DIAGNOSIS — R279 Unspecified lack of coordination: Secondary | ICD-10-CM

## 2015-11-01 DIAGNOSIS — Z741 Need for assistance with personal care: Secondary | ICD-10-CM

## 2015-11-01 DIAGNOSIS — R2681 Unsteadiness on feet: Secondary | ICD-10-CM

## 2015-11-01 MED ORDER — CEPHALEXIN 500 MG PO CAPS: 500.0000 mg | ORAL_CAPSULE | Freq: Three times a day (TID) | ORAL | Status: DC

## 2015-11-01 NOTE — Telephone Encounter (Signed)
Sent in rx to treat just in case this is causing her symptoms.

## 2015-11-01 NOTE — Therapy (Signed)
[place] MAIN [facility] SERVICES [address] [place], [place], [address] Phone: [phone]   Fax:  [phone]  Occupational Therapy Treatment  Patient Details  Name: [name] MRN: [id number] Date of Birth: February 19, 1946 No Data Recorded  Encounter Date: 11/01/2015      OT End of Session - 11/01/15 1542    Visit Number 6   Number of Visits 17   Date for OT Re-Evaluation 11/22/15   OT Start Time 1000   OT Stop Time 1058   OT Time Calculation (min) 58 min   Activity Tolerance Patient tolerated treatment well   Behavior During Therapy [facility] for tasks assessed/performed      Past Medical History  Diagnosis Date  . B12 DEFICIENCY 05/03/2007  . FIBROMYALGIA 05/03/2007  . ANEMIA-NOS 09/25/2007  . HYPERLIPIDEMIA 12/19/2007  . HYPOTENSION, ORTHOSTATIC 12/06/2008  . OSTEOPOROSIS 08/2009    bisphosphonate on hold 2/2 dysphagia, on reclast done in August each year  . 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 GERD 02/22/2010  . Interstitial cystitis     Ottelin now Dr Amalia Hailey  . CAD (coronary artery disease) 01/2010    MI, Nishan  . History of CHF (congestive heart failure) 01/2010  . Cardiomyopathy ([place]) 01/2010  . History of colon polyps 2004  . Takotsubo cardiomyopathy 2008    due to E coli urosepsis  . Glaucoma 02/2013    [facility]  . MCTD (mixed connective tissue disease) ([place]) 02/08/2010  . Lupus (systemic lupus erythematosus) ([place]) 02/08/2010  . Parkinson's disease ([place]) 08/25/2015    Dx Dr [name] 07/2015     Past Surgical History  Procedure Laterality Date  . Cholecystectomy      complication - low blood pressure  . Abdominal hysterectomy  1970s    IUD infection - first partial then with oophorectomy (cysts), complication - low blood pressure  . Colonoscopy  06/2008    h/o polyps but latest WNL, rec rpt 10 yrs Olevia Perches)  . Dexa  04/2013    T -2.9 @ femur, -1.6 @ spine  . Cystoscopy  12/2013    abx treatment for  recurrent cystitis    There were no vitals filed for this visit.  Visit Diagnosis:  Muscle weakness (generalized)  Lack of coordination  Difficulty walking  Unsteady gait  Self-care deficit for bathing and hygiene      Subjective Assessment - 11/01/15 1008    Subjective  Patient reports her right arm is feeling better today, felt it might have been the weather change.    Patient Stated Goals Patient reports she wants to be independent, get better at basic tasks.    Currently in Pain? Yes   Pain Score 4    Pain Location Arm   Pain Orientation Right   Pain Descriptors / Indicators Aching   Pain Type Acute pain   Pain Onset 1 to 4 weeks ago   Pain Frequency Constant   Multiple Pain Sites Yes   Pain Score 4   Pain Location Back   Pain Descriptors / Indicators Aching   Pain Type Acute pain   Pain Onset In the past 7 days   Pain Frequency Constant                      OT Treatments/Exercises (OP) - 11/01/15 1522    ADLs   ADL Comments Formulation of functional component tasks this date as follows:  sit to stand, negotiation of steps, reaching overhead for prolonged periods to perform hair care, picking up dishes to wash and put away.  Patient needs to add one more item to the list but wants to think about it more tonight.     Neurological Re-education Exercises   Other Exercises 1 Patient seen for LSVT BIG exercises: LSVT Daily Session Maximal Daily Exercises: Sustained movements are designed to rescale the amplitude of movement output for generalization to daily functional activities. Performed as follows for 1 set of 10 repetitions each: Multi directional sustained movements- 1) Floor to ceiling, 2) Side to side. Multi directional Repetitive movements performed in standing and are designed to provide retraining effort needed for sustained muscle activation in tasks Performed as follows: 3) Step and reach forward, 4) Step and Reach Backwards, 5) Step and reach  sideways, 6) Rock and reach forward/backward, 7) Rock and reach sideways. Sit to stand from mat table on lowest setting with minimal cues for weight shift this date and performing 10 reps without rest break. Patient seen for functional mobility tasks this date with emphasis on gait speed, length of steps with short distance ambulation. Patient requires cues for BIG movements and cadence.All standing exercises performed this date with CGA to minimal assist, verbal and tactile cues as needed.   Other Exercises 2 Functional moblility skills this date for 500 feet with cues for reciprocal arm swing and amplitude of gait. Reciprocal tapping of toes on stairs with cues and CGA in standing. Stair negotiation with CGA and cues for weight shifting with reciprocal step going up. Modified all exercises this date to compensate for pain in the right UE, issued and instructed on written/pictorial exercises                OT Education - 11/01/15 1542    Education provided Yes   Education Details HEP, balance, sit to stand   Person(s) Educated Patient   Methods Explanation;Demonstration;Verbal cues   Comprehension Verbal cues required;Returned demonstration;Verbalized understanding             OT Long Term Goals - 10/19/15 2006    OT LONG TERM GOAL #1   Title Patient will improve gait speed and endurance and be able to ambulate 500 feet in 6 minutes to negotiate around the home and community safely in 4 weeks.   Baseline 370 feet at evaluation   Time 4   Period Weeks   Status New   OT LONG TERM GOAL #2   Title Patient will complete HEP for maximal daily exercises with modified independence in 4 weeks   Baseline no current program to address exercises at home   Time 4   Status New   OT LONG TERM GOAL #3   Title Patient will transfer from sit to stand without the use of arms safely and independently from a variety of chairs/surfaces in 4 weeks.   Baseline unable to complete sit to stand  without the use of arms at eval   Time 4   Period Weeks   Status New   OT LONG TERM GOAL #4   Title Patient will decrease frequency of freezing episodes with score of 9 or less on Freezing of Gait questionairre   Baseline score of 11 at eval   Time 4   Period Weeks   Status New   OT LONG TERM GOAL #5   Title Patient will complete basic self care tasks with modified independence.   Time 4  Period Weeks   Status New   Long Term Additional Goals   Additional Long Term Goals Yes   OT LONG TERM GOAL #6   Title Patient will complete handwriting of name, address and phone number with 75% legibility and without signs of micrographia.   Time 4   Period Weeks   Status New               Plan - 11/01/15 1543    Clinical Impression Statement Patient reports decreased pain in the right arm today and was able to perform exercises with using the arm, cues for amplitude of movement with exercises, functional mobility and reciprocal arm swing.  Patient worked on performing exercises at home over the weekend with the assistance of her family but still required the use of a chair for balance and performing the tasks in a modified technique.  She demonstrated significant improvements in sit to stand this date from mat table, decreased cues and assist needed and was able to perform 10 reps without a rest break and without the use of her arms.  Demonstrated increase in distance of functional mobility as well.     Pt will benefit from skilled therapeutic intervention in order to improve on the following deficits (Retired) Decreased knowledge of use of DME;Impaired flexibility;Pain;Decreased coordination;Decreased mobility;Decreased endurance;Decreased activity tolerance;Decreased range of motion;Decreased strength;Decreased balance;Difficulty walking;Impaired UE functional use   Rehab Potential Good   OT Frequency 4x / week   OT Duration 4 weeks   OT Treatment/Interventions Self-care/ADL  training;Therapeutic exercise;Gait Training;Neuromuscular education;Stair Training;Functional Mobility Training;Patient/family education;DME and/or AE instruction;Balance training   Consulted and Agree with Plan of Care Patient        Problem List Patient Active Problem List   Diagnosis Date Noted  . Parkinson's disease ([place]) 08/25/2015  . Advanced care planning/counseling discussion 05/26/2015  . Family circumstance 02/23/2015  . Right foot pain 09/15/2014  . Health maintenance examination 05/21/2014  . MDD (major depressive disorder), single episode, moderate ([place]) 11/12/2013  . Dysuria 09/02/2013  . Back pain 05/31/2013  . Medicare annual wellness visit, subsequent 05/20/2013  . HLD (hyperlipidemia) 05/20/2013  . Vitamin D deficiency 05/09/2013  . Tremor 07/15/2012  . Recurrent UTI 01/03/2011  . Syncope 01/03/2011  . RASH-NONVESICULAR 05/11/2010  . GERD 02/22/2010  . Reflex sympathetic dystrophy 02/08/2010  . CARDIOMYOPATHY 02/08/2010  . CAD (coronary artery disease) 01/25/2010  . Osteoporosis 11/10/2009  . HYPOTENSION, ORTHOSTATIC 12/06/2008  . Anemia 09/25/2007  . B12 deficiency 05/03/2007  . Fibromyalgia 05/03/2007   [name], OTR/L, CLT Maegan Buller 11/01/2015, 3:49 PM  [place] MAIN [facility] SERVICES [address] [place], [place], [address] Phone: [phone]   Fax:  [phone]  Name: ELYSIUM BEJAR MRN: [id number] Date of Birth: December 23, 1945

## 2015-11-01 NOTE — Telephone Encounter (Signed)
Ms. Boothby notified Keflex has been sent into [facility] [address]

## 2015-11-01 NOTE — Telephone Encounter (Signed)
-----   Message from Carter Kitten, [place] sent at 11/01/2015  2:13 PM EST ----- Ms. Rodas notified as instructed by telephone.  She states she is having the same symptoms she was having when she was in the office.  She states she is running to the bathroom all the time.  Please advise.

## 2015-11-02 ENCOUNTER — Ambulatory Visit: Payer: Medicare Other | Admitting: Occupational Therapy

## 2015-11-02 ENCOUNTER — Encounter: Payer: Self-pay | Admitting: Occupational Therapy

## 2015-11-02 DIAGNOSIS — R460 Very low level of personal hygiene: Secondary | ICD-10-CM

## 2015-11-02 DIAGNOSIS — R2681 Unsteadiness on feet: Secondary | ICD-10-CM

## 2015-11-02 DIAGNOSIS — R279 Unspecified lack of coordination: Secondary | ICD-10-CM

## 2015-11-02 DIAGNOSIS — M6281 Muscle weakness (generalized): Secondary | ICD-10-CM | POA: Diagnosis not present

## 2015-11-02 DIAGNOSIS — R262 Difficulty in walking, not elsewhere classified: Secondary | ICD-10-CM

## 2015-11-02 DIAGNOSIS — Z741 Need for assistance with personal care: Secondary | ICD-10-CM

## 2015-11-03 ENCOUNTER — Encounter: Payer: Self-pay | Admitting: Occupational Therapy

## 2015-11-03 ENCOUNTER — Ambulatory Visit: Payer: Medicare Other | Admitting: Occupational Therapy

## 2015-11-03 DIAGNOSIS — R460 Very low level of personal hygiene: Secondary | ICD-10-CM

## 2015-11-03 DIAGNOSIS — M6281 Muscle weakness (generalized): Secondary | ICD-10-CM

## 2015-11-03 DIAGNOSIS — Z741 Need for assistance with personal care: Secondary | ICD-10-CM

## 2015-11-03 DIAGNOSIS — R262 Difficulty in walking, not elsewhere classified: Secondary | ICD-10-CM

## 2015-11-03 DIAGNOSIS — R279 Unspecified lack of coordination: Secondary | ICD-10-CM

## 2015-11-03 DIAGNOSIS — R2681 Unsteadiness on feet: Secondary | ICD-10-CM

## 2015-11-03 NOTE — Therapy (Signed)
[place] MAIN [facility] SERVICES [address] [place], [place], [address] Phone: [phone]   Fax:  [phone]  Occupational Therapy Treatment  Patient Details  Name: [name] MRN: [id number] Date of Birth: 01-23-1946 No Data Recorded  Encounter Date: 11/02/2015      OT End of Session - 11/02/15 1546    Visit Number 7   Number of Visits 17   Date for OT Re-Evaluation 11/22/15   OT Start Time 1001   OT Stop Time 1100   OT Time Calculation (min) 59 min   Activity Tolerance Patient tolerated treatment well   Behavior During Therapy [facility] for tasks assessed/performed      Past Medical History  Diagnosis Date  . B12 DEFICIENCY 05/03/2007  . FIBROMYALGIA 05/03/2007  . ANEMIA-NOS 09/25/2007  . HYPERLIPIDEMIA 12/19/2007  . HYPOTENSION, ORTHOSTATIC 12/06/2008  . OSTEOPOROSIS 08/2009    bisphosphonate on hold 2/2 dysphagia, on reclast done in August each year  . 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 GERD 02/22/2010  . Interstitial cystitis     Ottelin now Dr Amalia Hailey  . CAD (coronary artery disease) 01/2010    MI, Nishan  . History of CHF (congestive heart failure) 01/2010  . Cardiomyopathy ([place]) 01/2010  . History of colon polyps 2004  . Takotsubo cardiomyopathy 2008    due to E coli urosepsis  . Glaucoma 02/2013     [facility] center  . MCTD (mixed connective tissue disease) ([place]) 02/08/2010  . Lupus (systemic lupus erythematosus) ([place]) 02/08/2010  . Parkinson's disease ([place]) 08/25/2015    Dx Dr [name] 07/2015     Past Surgical History  Procedure Laterality Date  . Cholecystectomy      complication - low blood pressure  . Abdominal hysterectomy  1970s    IUD infection - first partial then with oophorectomy (cysts), complication - low blood pressure  . Colonoscopy  06/2008    h/o polyps but latest WNL, rec rpt 10 yrs Olevia Perches)  . Dexa  04/2013    T -2.9 @ femur, -1.6 @ spine  . Cystoscopy  12/2013    abx treatment for  recurrent cystitis    There were no vitals filed for this visit.  Visit Diagnosis:  Muscle weakness (generalized)  Lack of coordination  Difficulty walking  Unsteady gait  Self-care deficit for bathing and hygiene      Subjective Assessment - 11/02/15 1011    Subjective  Patient reports she is doing well and has been working on exercises at home, is pleased with her ability to go from sit to stand now without arms from specific surfaces.   Patient Stated Goals Patient reports she wants to be independent, get better at basic tasks.    Currently in Pain? Yes   Pain Score 5    Pain Location Arm   Pain Orientation Right   Pain Descriptors / Indicators Aching   Pain Type Acute pain   Pain Onset 1 to 4 weeks ago   Pain Frequency Constant   Multiple Pain Sites No                      OT Treatments/Exercises (OP) - 11/02/15 1543    ADLs   ADL Comments Patient seen for functional component tasks as follows:  sit to stand, negotiation of steps, reaching overhead for prolonged periods to perform hair care, picking up dishes to wash and put away. Added  in tub transfers as 5th component.   Neurological Re-education Exercises   Other Exercises 1 Patient seen for LSVT BIG exercises: LSVT Daily Session Maximal Daily Exercises: Sustained movements are designed to rescale the amplitude of movement output for generalization to daily functional activities. Performed as follows for 1 set of 10 repetitions each: Multi directional sustained movements- 1) Floor to ceiling, 2) Side to side. Multi directional Repetitive movements performed in standing and are designed to provide retraining effort needed for sustained muscle activation in tasks Performed as follows: 3) Step and reach forward, 4) Step and Reach Backwards, 5) Step and reach sideways, 6) Rock and reach forward/backward, 7) Rock and reach sideways. Sit to stand from mat table on lowest setting with minimal cues for weight shift  this date and performing 10 reps without rest break. Patient seen for functional mobility tasks this date with emphasis on gait speed, length of steps with short distance ambulation. Patient requires cues for BIG movements and cadence.All standing exercises performed this date with CGA to minimal assist, verbal and tactile cues as needed.   Other Exercises 2 Functional moblility skills this date for 600 feet with cues for reciprocal arm swing and amplitude of gait. Reciprocal tapping of toes on stairs with cues and CGA in standing. Stair negotiation with CGA and cues for weight shifting with reciprocal step going up.                 OT Education - 11/03/15 1545    Education provided Yes   Education Details maximal daily exercises, sit to stand, functional component tasks.   Person(s) Educated Patient   Methods Explanation;Demonstration;Verbal cues;Tactile cues   Comprehension Verbal cues required;Returned demonstration;Verbalized understanding;Tactile cues required             OT Long Term Goals - 10/19/15 2006    OT LONG TERM GOAL #1   Title Patient will improve gait speed and endurance and be able to ambulate 500 feet in 6 minutes to negotiate around the home and community safely in 4 weeks.   Baseline 370 feet at evaluation   Time 4   Period Weeks   Status New   OT LONG TERM GOAL #2   Title Patient will complete HEP for maximal daily exercises with modified independence in 4 weeks   Baseline no current program to address exercises at home   Time 4   Status New   OT LONG TERM GOAL #3   Title Patient will transfer from sit to stand without the use of arms safely and independently from a variety of chairs/surfaces in 4 weeks.   Baseline unable to complete sit to stand without the use of arms at eval   Time 4   Period Weeks   Status New   OT LONG TERM GOAL #4   Title Patient will decrease frequency of freezing episodes with score of 9 or less on Freezing of Gait  questionairre   Baseline score of 11 at eval   Time 4   Period Weeks   Status New   OT LONG TERM GOAL #5   Title Patient will complete basic self care tasks with modified independence.   Time 4   Period Weeks   Status New   Long Term Additional Goals   Additional Long Term Goals Yes   OT LONG TERM GOAL #6   Title Patient will complete handwriting of name, address and phone number with 75% legibility and without signs of micrographia.  Time 4   Period Weeks   Status New               Plan - 11/02/15 1546    Clinical Impression Statement Patient continues to progress in all areas.  Right arm still limits her ability to perform exercises optimally however, she is progressing with her ROM in this arm each session.  She has formulated her list of functional component tasks and is progressing with sit to stand from mat table with decreased cues and able to complete 10 reps without rest breaks.    Pt will benefit from skilled therapeutic intervention in order to improve on the following deficits (Retired) Decreased knowledge of use of DME;Impaired flexibility;Pain;Decreased coordination;Decreased mobility;Decreased endurance;Decreased activity tolerance;Decreased range of motion;Decreased strength;Decreased balance;Difficulty walking;Impaired UE functional use   Rehab Potential Good   OT Frequency 4x / week   OT Duration 4 weeks   OT Treatment/Interventions Self-care/ADL training;Therapeutic exercise;Gait Training;Neuromuscular education;Stair Training;Functional Mobility Training;Patient/family education;DME and/or AE instruction;Balance training   Consulted and Agree with Plan of Care Patient        Problem List Patient Active Problem List   Diagnosis Date Noted  . Parkinson's disease ([place]) 08/25/2015  . Advanced care planning/counseling discussion 05/26/2015  . Family circumstance 02/23/2015  . Right foot pain 09/15/2014  . Health maintenance examination 05/21/2014  . MDD  (major depressive disorder), single episode, moderate ([place]) 11/12/2013  . Dysuria 09/02/2013  . Back pain 05/31/2013  . Medicare annual wellness visit, subsequent 05/20/2013  . HLD (hyperlipidemia) 05/20/2013  . Vitamin D deficiency 05/09/2013  . Tremor 07/15/2012  . Recurrent UTI 01/03/2011  . Syncope 01/03/2011  . RASH-NONVESICULAR 05/11/2010  . GERD 02/22/2010  . Reflex sympathetic dystrophy 02/08/2010  . CARDIOMYOPATHY 02/08/2010  . CAD (coronary artery disease) 01/25/2010  . Osteoporosis 11/10/2009  . HYPOTENSION, ORTHOSTATIC 12/06/2008  . Anemia 09/25/2007  . B12 deficiency 05/03/2007  . Fibromyalgia 05/03/2007   Amy T Tomasita Morrow, OTR/L, CLT  [place]ovett,Amy 11/03/2015, 3:49 PM  [place] MAIN [facility] SERVICES [address] [place], [place], [address] Phone: [phone]   Fax:  [phone]  Name: [name] MRN: [id number] Date of Birth: [birth year]

## 2015-11-04 ENCOUNTER — Ambulatory Visit: Payer: Medicare Other | Admitting: Occupational Therapy

## 2015-11-04 ENCOUNTER — Encounter: Payer: Self-pay | Admitting: Occupational Therapy

## 2015-11-04 DIAGNOSIS — M6281 Muscle weakness (generalized): Secondary | ICD-10-CM

## 2015-11-04 DIAGNOSIS — R262 Difficulty in walking, not elsewhere classified: Secondary | ICD-10-CM

## 2015-11-04 DIAGNOSIS — Z741 Need for assistance with personal care: Secondary | ICD-10-CM

## 2015-11-04 DIAGNOSIS — R279 Unspecified lack of coordination: Secondary | ICD-10-CM

## 2015-11-04 DIAGNOSIS — R2681 Unsteadiness on feet: Secondary | ICD-10-CM

## 2015-11-04 DIAGNOSIS — R460 Very low level of personal hygiene: Secondary | ICD-10-CM

## 2015-11-04 NOTE — Therapy (Signed)
[place] MAIN [facility] SERVICES [address] [place], [place], [address] Phone: [phone]   Fax:  [phone]  Occupational Therapy Treatment  Patient Details  Name: [name] MRN: [id number] Date of Birth: 05-24-1946 No Data Recorded  Encounter Date: 11/03/2015      OT End of Session - 11/03/15 1628    Visit Number 8   Number of Visits 17   Date for OT Re-Evaluation 11/22/15   OT Start Time 1000   OT Stop Time 1100   OT Time Calculation (min) 60 min   Activity Tolerance Patient tolerated treatment well   Behavior During Therapy [facility] for tasks assessed/performed      Past Medical History  Diagnosis Date  . B12 DEFICIENCY 05/03/2007  . FIBROMYALGIA 05/03/2007  . ANEMIA-NOS 09/25/2007  . HYPERLIPIDEMIA 12/19/2007  . HYPOTENSION, ORTHOSTATIC 12/06/2008  . OSTEOPOROSIS 08/2009    bisphosphonate on hold 2/2 dysphagia, on reclast done in August each year  . 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 GERD 02/22/2010  . Interstitial cystitis     Ottelin now Dr Amalia Hailey  . CAD (coronary artery disease) 01/2010    MI, Nishan  . History of CHF (congestive heart failure) 01/2010  . Cardiomyopathy ([place]) 01/2010  . History of colon polyps 2004  . Takotsubo cardiomyopathy 2008    due to E coli urosepsis  . Glaucoma 02/2013    [facility]  . MCTD (mixed connective tissue disease) ([place]) 02/08/2010  . Lupus (systemic lupus erythematosus) ([place]) 02/08/2010  . Parkinson's disease ([place]) 08/25/2015    Dx Dr [name] 07/2015     Past Surgical History  Procedure Laterality Date  . Cholecystectomy      complication - low blood pressure  . Abdominal hysterectomy  1970s    IUD infection - first partial then with oophorectomy (cysts), complication - low blood pressure  . Colonoscopy  06/2008    h/o polyps but latest WNL, rec rpt 10 yrs Olevia Perches)  . Dexa  04/2013    T -2.9 @ femur, -1.6 @ spine  . Cystoscopy  12/2013    abx treatment for  recurrent cystitis    There were no vitals filed for this visit.  Visit Diagnosis:  Muscle weakness (generalized)  Lack of coordination  Self-care deficit for bathing and hygiene  Difficulty walking  Unsteady gait      Subjective Assessment - 11/03/15 1627    Subjective  Patient reports everything is about the same as the day before.  Did exercises last evening.   Patient Stated Goals Patient reports she wants to be independent, get better at basic tasks.    Currently in Pain? Yes   Pain Score 4    Pain Location Arm   Pain Orientation Right   Pain Descriptors / Indicators Aching   Pain Type Acute pain   Pain Onset 1 to 4 weeks ago   Pain Frequency Constant   Multiple Pain Sites No                      OT Treatments/Exercises (OP) - 11/03/15 1625    ADLs   ADL Comments Patient seen for functional component tasks as follows: sit to stand, negotiation of steps, reaching overhead for prolonged periods to perform hair care, picking up dishes to wash and put away. Added in tub transfers as 5th component.   Neurological Re-education Exercises   Other Exercises 1  Patient seen for LSVT BIG exercises: LSVT Daily Session Maximal Daily Exercises: Sustained movements are designed to rescale the amplitude of movement output for generalization to daily functional activities. Performed as follows for 1 set of 10 repetitions each: Multi directional sustained movements- 1) Floor to ceiling, 2) Side to side. Multi directional Repetitive movements performed in standing and are designed to provide retraining effort needed for sustained muscle activation in tasks Performed as follows: 3) Step and reach forward, 4) Step and Reach Backwards, 5) Step and reach sideways, 6) Rock and reach forward/backward, 7) Rock and reach sideways. Sit to stand from mat table on lowest setting with minimal cues for weight shift this date and performing 10 reps without rest break. Patient seen for  functional mobility tasks this date with emphasis on gait speed, length of steps with short distance ambulation. Patient requires cues for BIG movements and cadence.All standing exercises performed this date with CGA to minimal assist, verbal and tactile cues as needed.   Other Exercises 2 Stair negotiation with cues for reciprocal stepping patterns and weight shifting as well as increasing amplitude of steps.   Toe taps on stairs for 10 reps each reciprocal patterns with cues, functional mobility for 650 feet with cues for size of steps as well as reciprocal arm swing with moderate cueing on the right side.  Balance tasks in standing with weight shifting with CGA and cues.                  OT Education - 11/03/15 1628    Education provided Yes   Person(s) Educated Patient   Methods Explanation;Demonstration;Verbal cues   Comprehension Verbal cues required;Returned demonstration;Verbalized understanding             OT Long Term Goals - 10/19/15 2006    OT LONG TERM GOAL #1   Title Patient will improve gait speed and endurance and be able to ambulate 500 feet in 6 minutes to negotiate around the home and community safely in 4 weeks.   Baseline 370 feet at evaluation   Time 4   Period Weeks   Status New   OT LONG TERM GOAL #2   Title Patient will complete HEP for maximal daily exercises with modified independence in 4 weeks   Baseline no current program to address exercises at home   Time 4   Status New   OT LONG TERM GOAL #3   Title Patient will transfer from sit to stand without the use of arms safely and independently from a variety of chairs/surfaces in 4 weeks.   Baseline unable to complete sit to stand without the use of arms at eval   Time 4   Period Weeks   Status New   OT LONG TERM GOAL #4   Title Patient will decrease frequency of freezing episodes with score of 9 or less on Freezing of Gait questionairre   Baseline score of 11 at eval   Time 4   Period  Weeks   Status New   OT LONG TERM GOAL #5   Title Patient will complete basic self care tasks with modified independence.   Time 4   Period Weeks   Status New   Long Term Additional Goals   Additional Long Term Goals Yes   OT LONG TERM GOAL #6   Title Patient will complete handwriting of name, address and phone number with 75% legibility and without signs of micrographia.   Time 4   Period Weeks  Status New               Plan - 11/03/15 1628    Clinical Impression Statement Patient has continued to make significant progress with the performance of her daily exercises and incorporating the use of the right arm into exercises and functional mobility.  She is now completing sit to stand from mat with cues and SBA.  She has been able to demonstrate larger amplitude of steps with functional mobility as well as a slight increase in speed as well.  She continues to benefit from intensive LSVT BIG program .     Pt will benefit from skilled therapeutic intervention in order to improve on the following deficits (Retired) Decreased knowledge of use of DME;Impaired flexibility;Pain;Decreased coordination;Decreased mobility;Decreased endurance;Decreased activity tolerance;Decreased range of motion;Decreased strength;Decreased balance;Difficulty walking;Impaired UE functional use   Rehab Potential Good   OT Frequency 4x / week   OT Duration 4 weeks   OT Treatment/Interventions Self-care/ADL training;Therapeutic exercise;Gait Training;Neuromuscular education;Stair Training;Functional Mobility Training;Patient/family education;DME and/or AE instruction;Balance training   Consulted and Agree with Plan of Care Patient        Problem List Patient Active Problem List   Diagnosis Date Noted  . Parkinson's disease ([place]) 08/25/2015  . Advanced care planning/counseling discussion 05/26/2015  . Family circumstance 02/23/2015  . Right foot pain 09/15/2014  . Health maintenance examination  05/21/2014  . MDD (major depressive disorder), single episode, moderate ([place]) 11/12/2013  . Dysuria 09/02/2013  . Back pain 05/31/2013  . Medicare annual wellness visit, subsequent 05/20/2013  . HLD (hyperlipidemia) 05/20/2013  . Vitamin D deficiency 05/09/2013  . Tremor 07/15/2012  . Recurrent UTI 01/03/2011  . Syncope 01/03/2011  . RASH-NONVESICULAR 05/11/2010  . GERD 02/22/2010  . Reflex sympathetic dystrophy 02/08/2010  . CARDIOMYOPATHY 02/08/2010  . CAD (coronary artery disease) 01/25/2010  . Osteoporosis 11/10/2009  . HYPOTENSION, ORTHOSTATIC 12/06/2008  . Anemia 09/25/2007  . B12 deficiency 05/03/2007  . Fibromyalgia 05/03/2007   Ravenna Legore T Tomasita Morrow, OTR/L, CLT  [name] 11/04/2015, 4:39 PM  [place] MAIN [facility] SERVICES [address] [place], [place], [address] Phone: [phone]   Fax:  [phone]  Name: [name] MRN: [id number] Date of Birth: 1946-10-09

## 2015-11-05 NOTE — Therapy (Signed)
[place] MAIN [facility] SERVICES [address] [place], [place], [address] Phone: [phone]   Fax:  [phone]  Occupational Therapy Treatment  Patient Details  Name: [name] MRN: [id number] Date of Birth: 1946-05-14 No Data Recorded  Encounter Date: 11/04/2015      OT End of Session - 11/04/15 1925    Visit Number 9   Number of Visits 17   Date for OT Re-Evaluation 11/22/15   OT Start Time 1001   OT Stop Time 1100   OT Time Calculation (min) 59 min   Activity Tolerance Patient tolerated treatment well   Behavior During Therapy [facility] for tasks assessed/performed      Past Medical History  Diagnosis Date  . B12 DEFICIENCY 05/03/2007  . FIBROMYALGIA 05/03/2007  . ANEMIA-NOS 09/25/2007  . HYPERLIPIDEMIA 12/19/2007  . HYPOTENSION, ORTHOSTATIC 12/06/2008  . OSTEOPOROSIS 08/2009    bisphosphonate on hold 2/2 dysphagia, on reclast done in August each year  . 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 GERD 02/22/2010  . Interstitial cystitis     Ottelin now Dr Amalia Hailey  . CAD (coronary artery disease) 01/2010    MI, Nishan  . History of CHF (congestive heart failure) 01/2010  . Cardiomyopathy ([place]) 01/2010  . History of colon polyps 2004  . Takotsubo cardiomyopathy 2008    due to E coli urosepsis  . Glaucoma 02/2013    [facility]  . MCTD (mixed connective tissue disease) ([place]) 02/08/2010  . Lupus (systemic lupus erythematosus) ([place]) 02/08/2010  . Parkinson's disease ([place]) 08/25/2015    Dx Dr [name] 07/2015     Past Surgical History  Procedure Laterality Date  . Cholecystectomy      complication - low blood pressure  . Abdominal hysterectomy  1970s    IUD infection - first partial then with oophorectomy (cysts), complication - low blood pressure  . Colonoscopy  06/2008    h/o polyps but latest WNL, rec rpt 10 yrs Olevia Perches)  . Dexa  04/2013    T -2.9 @ femur, -1.6 @ spine  . Cystoscopy  12/2013    abx treatment for  recurrent cystitis    There were no vitals filed for this visit.  Visit Diagnosis:  Muscle weakness (generalized)  Lack of coordination  Self-care deficit for bathing and hygiene  Difficulty walking  Unsteady gait      Subjective Assessment - 11/04/15 1923    Subjective  Patient reports she feels she made good progress this week and pleased with her performance.  Feels she is doing better.  Right arm about the same pain level, no increases in pain.   Patient Stated Goals Patient reports she wants to be independent, get better at basic tasks.    Currently in Pain? Yes   Pain Score 4    Pain Location Arm   Pain Orientation Right   Pain Descriptors / Indicators Aching   Pain Type Chronic pain   Pain Onset More than a month ago   Pain Frequency Constant   Multiple Pain Sites No                      OT Treatments/Exercises (OP) - 11/04/15 2021    ADLs   ADL Comments Patient seen for functional component tasks as follows: sit to stand, negotiation of steps, reaching overhead for prolonged periods to perform hair care, picking up dishes to wash and put away,  tub transfers as 5th component   Neurological Re-education Exercises   Other Exercises 1 Patient seen for LSVT BIG exercises: LSVT Daily Session Maximal Daily Exercises: Sustained movements are designed to rescale the amplitude of movement output for generalization to daily functional activities. Performed as follows for 1 set of 10 repetitions each: Multi directional sustained movements- 1) Floor to ceiling, 2) Side to side. Multi directional Repetitive movements performed in standing and are designed to provide retraining effort needed for sustained muscle activation in tasks Performed as follows: 3) Step and reach forward, 4) Step and Reach Backwards, 5) Step and reach sideways, 6) Rock and reach forward/backward, 7) Rock and reach sideways. Sit to stand from mat table on lowest setting with minimal cues for weight  shift this date and performing 10 reps without rest break. Patient seen for functional mobility tasks this date with emphasis on gait speed, length of steps with short distance ambulation. Patient requires cues for BIG movements and cadence.All standing exercises performed this date with CGA to minimal assist, verbal and tactile cues as needed.   Other Exercises 2 Stair negotiation with cues for reciprocal stepping patterns and weight shifting as well as increasing amplitude of steps. Toe taps on stairs for 10 reps each reciprocal patterns with cues, functional mobility for 650 feet with cues for size of steps as well as reciprocal arm swing with moderate cueing on the right side.                 OT Education - 11/04/15 1925    Education provided Yes   Education Details HEP for LSVT BIG program, functional component tasks, tub transfers.   Person(s) Educated Patient   Methods Explanation;Demonstration;Verbal cues   Comprehension Verbal cues required;Returned demonstration;Verbalized understanding             OT Long Term Goals - 10/19/15 2006    OT LONG TERM GOAL #1   Title Patient will improve gait speed and endurance and be able to ambulate 500 feet in 6 minutes to negotiate around the home and community safely in 4 weeks.   Baseline 370 feet at evaluation   Time 4   Period Weeks   Status New   OT LONG TERM GOAL #2   Title Patient will complete HEP for maximal daily exercises with modified independence in 4 weeks   Baseline no current program to address exercises at home   Time 4   Status New   OT LONG TERM GOAL #3   Title Patient will transfer from sit to stand without the use of arms safely and independently from a variety of chairs/surfaces in 4 weeks.   Baseline unable to complete sit to stand without the use of arms at eval   Time 4   Period Weeks   Status New   OT LONG TERM GOAL #4   Title Patient will decrease frequency of freezing episodes with score of 9  or less on Freezing of Gait questionairre   Baseline score of 11 at eval   Time 4   Period Weeks   Status New   OT LONG TERM GOAL #5   Title Patient will complete basic self care tasks with modified independence.   Time 4   Period Weeks   Status New   Long Term Additional Goals   Additional Long Term Goals Yes   OT LONG TERM GOAL #6   Title Patient will complete handwriting of name, address and phone number with 75% legibility  and without signs of micrographia.   Time 4   Period Weeks   Status New               Plan - 11/04/15 1926    Clinical Impression Statement Sit to stand improved significantly this week from mat table, will progress next week to applying principle to lower surfaces.  Still requires some cues for exercises but has been showing increased recall and insight into correct form.  Amplitude of gait improved but still needs verbal cues.  Demos decreased reciprocal arm swing on the right but responds well to verbal and tactile cues.    Pt will benefit from skilled therapeutic intervention in order to improve on the following deficits (Retired) Decreased knowledge of use of DME;Impaired flexibility;Pain;Decreased coordination;Decreased mobility;Decreased endurance;Decreased activity tolerance;Decreased range of motion;Decreased strength;Decreased balance;Difficulty walking;Impaired UE functional use   Rehab Potential Good   OT Frequency 4x / week   OT Duration 4 weeks   OT Treatment/Interventions Self-care/ADL training;Therapeutic exercise;Gait Training;Neuromuscular education;Stair Training;Functional Mobility Training;Patient/family education;DME and/or AE instruction;Balance training   Consulted and Agree with Plan of Care Patient        Problem List Patient Active Problem List   Diagnosis Date Noted  . Parkinson's disease ([place]) 08/25/2015  . Advanced care planning/counseling discussion 05/26/2015  . Family circumstance 02/23/2015  . Right foot pain  09/15/2014  . Health maintenance examination 05/21/2014  . MDD (major depressive disorder), single episode, moderate ([place]) 11/12/2013  . Dysuria 09/02/2013  . Back pain 05/31/2013  . Medicare annual wellness visit, subsequent 05/20/2013  . HLD (hyperlipidemia) 05/20/2013  . Vitamin D deficiency 05/09/2013  . Tremor 07/15/2012  . Recurrent UTI 01/03/2011  . Syncope 01/03/2011  . RASH-NONVESICULAR 05/11/2010  . GERD 02/22/2010  . Reflex sympathetic dystrophy 02/08/2010  . CARDIOMYOPATHY 02/08/2010  . CAD (coronary artery disease) 01/25/2010  . Osteoporosis 11/10/2009  . HYPOTENSION, ORTHOSTATIC 12/06/2008  . Anemia 09/25/2007  . B12 deficiency 05/03/2007  . Fibromyalgia 05/03/2007   Amy T Tomasita Morrow, OTR/L, CLT  [place]ovett,Amy 11/05/2015, 8:27 PM  [place] MAIN [facility] SERVICES [address] [place], [place], [address] Phone: [phone]   Fax:  [phone]  Name: BERNARD KILLORAN MRN: [id number] Date of Birth: 08-07-1946

## 2015-11-08 ENCOUNTER — Other Ambulatory Visit: Payer: Self-pay | Admitting: Neurology

## 2015-11-08 ENCOUNTER — Ambulatory Visit: Payer: Medicare Other | Admitting: Occupational Therapy

## 2015-11-08 DIAGNOSIS — R262 Difficulty in walking, not elsewhere classified: Secondary | ICD-10-CM

## 2015-11-08 DIAGNOSIS — R279 Unspecified lack of coordination: Secondary | ICD-10-CM

## 2015-11-08 DIAGNOSIS — M6281 Muscle weakness (generalized): Secondary | ICD-10-CM | POA: Diagnosis not present

## 2015-11-08 DIAGNOSIS — Z741 Need for assistance with personal care: Secondary | ICD-10-CM

## 2015-11-08 DIAGNOSIS — R2681 Unsteadiness on feet: Secondary | ICD-10-CM

## 2015-11-08 DIAGNOSIS — R460 Very low level of personal hygiene: Secondary | ICD-10-CM

## 2015-11-09 ENCOUNTER — Encounter: Payer: Self-pay | Admitting: Occupational Therapy

## 2015-11-09 ENCOUNTER — Telehealth: Payer: Self-pay | Admitting: Neurology

## 2015-11-09 ENCOUNTER — Ambulatory Visit: Payer: Medicare Other | Admitting: Occupational Therapy

## 2015-11-09 DIAGNOSIS — Z741 Need for assistance with personal care: Secondary | ICD-10-CM

## 2015-11-09 DIAGNOSIS — R2681 Unsteadiness on feet: Secondary | ICD-10-CM

## 2015-11-09 DIAGNOSIS — R460 Very low level of personal hygiene: Secondary | ICD-10-CM

## 2015-11-09 DIAGNOSIS — R279 Unspecified lack of coordination: Secondary | ICD-10-CM

## 2015-11-09 DIAGNOSIS — M6281 Muscle weakness (generalized): Secondary | ICD-10-CM | POA: Diagnosis not present

## 2015-11-09 DIAGNOSIS — R262 Difficulty in walking, not elsewhere classified: Secondary | ICD-10-CM

## 2015-11-09 NOTE — Telephone Encounter (Signed)
Mirapex refill denied. Patient should be on higher dose.

## 2015-11-09 NOTE — Therapy (Signed)
[place] MAIN [facility] SERVICES [address] [place], [place], [address] Phone: [phone]   Fax:  [phone]  Occupational Therapy Treatment Occupational Therapy Progress Note 10/19/2015 to 11/08/2015   Patient Details  Name: [name] MRN: [id number] Date of Birth: 06/04/1946 No Data Recorded  Encounter Date: 11/08/2015      OT End of Session - 11/09/15 1916    Visit Number 10   Number of Visits 17   Date for OT Re-Evaluation 11/22/15   OT Start Time 1002   OT Stop Time 1100   OT Time Calculation (min) 58 min   Activity Tolerance Patient tolerated treatment well   Behavior During Therapy [facility] for tasks assessed/performed      Past Medical History  Diagnosis Date  . B12 DEFICIENCY 05/03/2007  . FIBROMYALGIA 05/03/2007  . ANEMIA-NOS 09/25/2007  . HYPERLIPIDEMIA 12/19/2007  . HYPOTENSION, ORTHOSTATIC 12/06/2008  . OSTEOPOROSIS 08/2009    bisphosphonate on hold 2/2 dysphagia, on reclast done in August each year  . 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 GERD 02/22/2010  . Interstitial cystitis     Ottelin now Dr Amalia Hailey  . CAD (coronary artery disease) 01/2010    MI, Nishan  . History of CHF (congestive heart failure) 01/2010  . Cardiomyopathy ([place]) 01/2010  . History of colon polyps 2004  . Takotsubo cardiomyopathy 2008    due to E coli urosepsis  . Glaucoma 02/2013    [facility]  . MCTD (mixed connective tissue disease) ([place]) 02/08/2010  . Lupus (systemic lupus erythematosus) ([place]) 02/08/2010  . Parkinson's disease ([place]) 08/25/2015    Dx Dr [name] 07/2015     Past Surgical History  Procedure Laterality Date  . Cholecystectomy      complication - low blood pressure  . Abdominal hysterectomy  1970s    IUD infection - first partial then with oophorectomy (cysts), complication - low blood pressure  . Colonoscopy  06/2008    h/o polyps but latest WNL, rec rpt 10 yrs Olevia Perches)  . Dexa  04/2013    T -2.9 @  femur, -1.6 @ spine  . Cystoscopy  12/2013    abx treatment for recurrent cystitis    There were no vitals filed for this visit.  Visit Diagnosis:  Muscle weakness (generalized)  Lack of coordination  Self-care deficit for bathing and hygiene  Difficulty walking  Unsteady gait      Subjective Assessment - 11/08/15 1858    Subjective  Patient reports she is determined to get better. Has been working on exercises at home and doing well.     Patient Stated Goals Patient reports she wants to be independent, get better at basic tasks.    Currently in Pain? Yes   Pain Score 4    Pain Location Arm   Pain Orientation Right   Pain Descriptors / Indicators Aching   Pain Type Chronic pain   Pain Onset More than a month ago   Pain Frequency Constant                      OT Treatments/Exercises (OP) - 11/08/15 1905    ADLs   ADL Comments Patient seen for functional component tasks as follows: sit to stand, negotiation of steps, reaching overhead for prolonged periods to perform hair care, picking up dishes to wash and put away, tub transfers..  Cues for techniques with getting in and out  of the tub.     Neurological Re-education Exercises   Other Exercises 1 Patient seen for LSVT BIG exercises:  LSVT Daily Session Maximal Daily Exercises: Sustained movements are designed to rescale the amplitude of movement output for generalization to daily functional activities. Performed as follows for 1 set of 10 repetitions each: Multi directional sustained movements- 1) Floor to ceiling, 2) Side to side. Multi directional Repetitive movements performed in standing and are designed to provide retraining effort needed for sustained muscle activation in tasks Performed as follows: 3) Step and reach forward, 4) Step and Reach Backwards, 5) Step and reach sideways, 6) Rock and reach forward/backward, 7) Rock and reach sideways. Sit to stand from mat table on lowest setting with minimal cues for  weight shift this date and performing 10 reps without rest break. Patient seen for functional mobility tasks this date with emphasis on gait speed, length of steps with short distance ambulation. Patient requires cues for BIG movements and cadence. All standing exercises performed this date with CGA, verbal and tactile cues as needed.    Other Exercises 2 Stair negotiation with cues for reciprocal stepping patterns and weight shifting as well as increasing amplitude of steps. Toe taps on stairs for 10 reps each reciprocal patterns with cues.  5 times sit to stand 20 secs this date (unable to perform at eval).  6 minute walk test, 540 feet (increased from 370 feet at eval)                OT Education - 11/08/15 1916    Education provided Yes   Education Details HEP, goals, reassessment   Person(s) Educated Patient   Methods Explanation;Demonstration;Verbal cues   Comprehension Verbal cues required;Returned demonstration;Verbalized understanding             OT Long Term Goals - 11/09/15 1919    OT LONG TERM GOAL #1   Title Patient will improve gait speed and endurance and be able to ambulate 600 feet in 6 minutes to negotiate around the home and community safely in 4 weeks.   Baseline 370 feet at evaluation, 540 at 10th visit.   Time 4   Period Weeks   Status Revised   OT LONG TERM GOAL #2   Title Patient will complete HEP for maximal daily exercises with modified independence in 4 weeks   Time 4   Status On-going   OT LONG TERM GOAL #3   Title Patient will transfer from sit to stand without the use of arms safely and independently from a variety of chairs/surfaces in 4 weeks.   Baseline unable to complete sit to stand without the use of arms at eval, 20 secs at 10th visit, will progress to other surfaces.    Time 4   Period Weeks   Status On-going   OT LONG TERM GOAL #4   Title Patient will decrease frequency of freezing episodes with score of 9 or less on Freezing of  Gait questionairre   Baseline score of 11 at eval   Time 4   Period Weeks   Status On-going   OT LONG TERM GOAL #5   Title Patient will complete basic self care tasks with modified independence.   Time 4   Period Weeks   Status On-going   OT LONG TERM GOAL #6   Title Patient will complete handwriting of name, address and phone number with 75% legibility and without signs of micrographia.   Time 4   Period Weeks  Status On-going               Plan - 11/08/15 1917    Clinical Impression Statement Patient reassessed this date and has made significant gains in all areas.  Patient is now able to perform sit to stand from the mat table at the lowest setting without the use of arms and complete 5 times sit to stand test in 20 secs.  She was able to improve her 6 minute walk test from 370 to 540 feet.  She is demonstrating carryover with tasks in the clinic to performance areas at home.  Becoming more independent with home exercises and less dependent on chair for balance.     Pt will benefit from skilled therapeutic intervention in order to improve on the following deficits (Retired) Decreased knowledge of use of DME;Impaired flexibility;Pain;Decreased coordination;Decreased mobility;Decreased endurance;Decreased activity tolerance;Decreased range of motion;Decreased strength;Decreased balance;Difficulty walking;Impaired UE functional use   Rehab Potential Good   OT Frequency 4x / week   OT Duration 4 weeks   OT Treatment/Interventions Self-care/ADL training;Therapeutic exercise;Gait Training;Neuromuscular education;Stair Training;Functional Mobility Training;Patient/family education;DME and/or AE instruction;Balance training   Consulted and Agree with Plan of Care Patient          G-Codes - 11/17/2015 1921    Functional Assessment Tool Used 6 minute walk test, 5 times sit to stand, clinical judgment, ADL assessment   Functional Limitation Mobility: Walking and moving around    Mobility: Walking and Moving Around Current Status [id number]) At least 40 percent but less than 60 percent impaired, limited or restricted   Mobility: Walking and Moving Around Goal Status [phone]) At least 20 percent but less than 40 percent impaired, limited or restricted      Problem List Patient Active Problem List   Diagnosis Date Noted  . Parkinson's disease ([place]) 08/25/2015  . Advanced care planning/counseling discussion 05/26/2015  . Family circumstance 02/23/2015  . Right foot pain 09/15/2014  . Health maintenance examination 05/21/2014  . MDD (major depressive disorder), single episode, moderate ([place]) 11/12/2013  . Dysuria 09/02/2013  . Back pain 05/31/2013  . Medicare annual wellness visit, subsequent 05/20/2013  . HLD (hyperlipidemia) 05/20/2013  . Vitamin D deficiency 05/09/2013  . Tremor 07/15/2012  . Recurrent UTI 01/03/2011  . Syncope 01/03/2011  . RASH-NONVESICULAR 05/11/2010  . GERD 02/22/2010  . Reflex sympathetic dystrophy 02/08/2010  . CARDIOMYOPATHY 02/08/2010  . CAD (coronary artery disease) 01/25/2010  . Osteoporosis 11/10/2009  . HYPOTENSION, ORTHOSTATIC 12/06/2008  . Anemia 09/25/2007  . B12 deficiency 05/03/2007  . Fibromyalgia 05/03/2007   Achilles Dunk, OTR/L, CLT  Brysen Shankman November 17, 2015, 7:23 PM  [place] MAIN [facility] SERVICES [address] [place], [place], [address] Phone: [phone]   Fax:  [phone]  Name: [name] MRN: [id number] Date of Birth: [birth year]

## 2015-11-09 NOTE — Telephone Encounter (Signed)
Called patient and left message letting her know she should have higher dose Mirapex available at the pharmacy and to call with any additional questions.

## 2015-11-09 NOTE — Telephone Encounter (Signed)
Pt states that she needs a refill on her as she spelled it  pramipexioe called into [facility] pt phone number is [phone]

## 2015-11-10 ENCOUNTER — Ambulatory Visit: Payer: Medicare Other | Admitting: Occupational Therapy

## 2015-11-10 ENCOUNTER — Encounter: Payer: Self-pay | Admitting: Occupational Therapy

## 2015-11-10 DIAGNOSIS — R2681 Unsteadiness on feet: Secondary | ICD-10-CM

## 2015-11-10 DIAGNOSIS — R460 Very low level of personal hygiene: Secondary | ICD-10-CM

## 2015-11-10 DIAGNOSIS — M6281 Muscle weakness (generalized): Secondary | ICD-10-CM | POA: Diagnosis not present

## 2015-11-10 DIAGNOSIS — R279 Unspecified lack of coordination: Secondary | ICD-10-CM

## 2015-11-10 DIAGNOSIS — Z741 Need for assistance with personal care: Secondary | ICD-10-CM

## 2015-11-10 DIAGNOSIS — R262 Difficulty in walking, not elsewhere classified: Secondary | ICD-10-CM

## 2015-11-10 NOTE — Therapy (Signed)
[place] MAIN [facility] SERVICES [address] [place], [place], [address] Phone: [phone]   Fax:  [phone]  Occupational Therapy Treatment  Patient Details  Name: [name] MRN: [id number] Date of Birth: [birth year] No Data Recorded  Encounter Date: 11/09/2015      OT End of Session - 11/09/15 2028    Visit Number 11   Number of Visits 17   Date for OT Re-Evaluation 11/22/15   OT Start Time 1000   OT Stop Time 1059   OT Time Calculation (min) 59 min   Activity Tolerance Patient tolerated treatment well   Behavior During Therapy [facility] for tasks assessed/performed      Past Medical History  Diagnosis Date  . B12 DEFICIENCY 05/03/2007  . FIBROMYALGIA 05/03/2007  . ANEMIA-NOS 09/25/2007  . HYPERLIPIDEMIA 12/19/2007  . HYPOTENSION, ORTHOSTATIC 12/06/2008  . OSTEOPOROSIS 08/2009    bisphosphonate on hold 2/2 dysphagia, on reclast done in August each year  . 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 GERD 02/22/2010  . Interstitial cystitis     Ottelin now Dr Amalia Hailey  . CAD (coronary artery disease) 01/2010    MI, Nishan  . History of CHF (congestive heart failure) 01/2010  . Cardiomyopathy ([place]) 01/2010  . History of colon polyps 2004  . Takotsubo cardiomyopathy 2008    due to E coli urosepsis  . Glaucoma 02/2013    [facility]  . MCTD (mixed connective tissue disease) ([place]) 02/08/2010  . Lupus (systemic lupus erythematosus) ([place]) 02/08/2010  . Parkinson's disease ([place]) 08/25/2015    Dx Dr [name] 07/2015     Past Surgical History  Procedure Laterality Date  . Cholecystectomy      complication - low blood pressure  . Abdominal hysterectomy  1970s    IUD infection - first partial then with oophorectomy (cysts), complication - low blood pressure  . Colonoscopy  06/2008    h/o polyps but latest WNL, rec rpt 10 yrs Olevia Perches)  . Dexa  04/2013    T -2.9 @ femur, -1.6 @ spine  . Cystoscopy  12/2013    abx treatment for  recurrent cystitis    There were no vitals filed for this visit.  Visit Diagnosis:  Muscle weakness (generalized)  Lack of coordination  Self-care deficit for bathing and hygiene  Difficulty walking  Unsteady gait      Subjective Assessment - 11/09/15 2027    Subjective  Patient reports she did not get much sleep last night, grandson was awake for part of the night.  Did her exercises and feels she is getting better at her performance.  Tried getting out of the tub using techniques performed in the gym and was successful.     Patient Stated Goals Patient reports she wants to be independent, get better at basic tasks.    Currently in Pain? Yes   Pain Score 4    Pain Location Arm   Pain Orientation Right   Pain Descriptors / Indicators Aching   Pain Type Chronic pain   Pain Onset More than a month ago   Pain Frequency Constant   Multiple Pain Sites No                      OT Treatments/Exercises (OP) - 11/09/15 0908    ADLs   ADL Comments Patient seen for functional component tasks as follows: sit to stand, negotiation of steps, reaching  overhead for prolonged periods to perform hair care, picking up dishes to wash and put away, tub transfers.. Cues for techniques with getting in and out of the tub   Neurological Re-education Exercises   Other Exercises 1 Patient seen for LSVT BIG exercises: LSVT Daily Session Maximal Daily Exercises: Sustained movements are designed to rescale the amplitude of movement output for generalization to daily functional activities. Performed as follows for 1 set of 10 repetitions each: Multi directional sustained movements- 1) Floor to ceiling, 2) Side to side. Multi directional Repetitive movements performed in standing and are designed to provide retraining effort needed for sustained muscle activation in tasks Performed as follows: 3) Step and reach forward, 4) Step and Reach Backwards, 5) Step and reach sideways, 6) Rock and reach  forward/backward, 7) Rock and reach sideways. Sit to stand from mat table on lowest setting with minimal cues for weight shift this date and performing 10 reps without rest break. Patient seen for functional mobility tasks this date with emphasis on gait speed, length of steps with short distance ambulation. Patient requires cues for BIG movements and cadence. Select standing exercises performed this date with CGA, verbal and tactile cues as needed.    Other Exercises 2 Stair negotiation with cues for reciprocal stepping patterns and weight shifting as well as increasing amplitude of steps. Toe taps on stairs for 10 reps each reciprocal patterns with cues.  functional mobility for 800 feet this date with cues for amplitude of steps and moderate cues for reciprocal arm swing with distractions.                 OT Education - 11/09/15 2028    Education provided Yes   Education Details HEP   Person(s) Educated Patient   Methods Explanation;Demonstration;Verbal cues   Comprehension Verbal cues required;Returned demonstration;Verbalized understanding             OT Long Term Goals - 11/09/15 1919    OT LONG TERM GOAL #1   Title Patient will improve gait speed and endurance and be able to ambulate 600 feet in 6 minutes to negotiate around the home and community safely in 4 weeks.   Baseline 370 feet at evaluation, 540 at 10th visit.   Time 4   Period Weeks   Status Revised   OT LONG TERM GOAL #2   Title Patient will complete HEP for maximal daily exercises with modified independence in 4 weeks   Time 4   Status On-going   OT LONG TERM GOAL #3   Title Patient will transfer from sit to stand without the use of arms safely and independently from a variety of chairs/surfaces in 4 weeks.   Baseline unable to complete sit to stand without the use of arms at eval, 20 secs at 10th visit, will progress to other surfaces.    Time 4   Period Weeks   Status On-going   OT LONG TERM GOAL #4    Title Patient will decrease frequency of freezing episodes with score of 9 or less on Freezing of Gait questionairre   Baseline score of 11 at eval   Time 4   Period Weeks   Status On-going   OT LONG TERM GOAL #5   Title Patient will complete basic self care tasks with modified independence.   Time 4   Period Weeks   Status On-going   OT LONG TERM GOAL #6   Title Patient will complete handwriting of name, address and phone  number with 75% legibility and without signs of micrographia.   Time 4   Period Weeks   Status On-going               Plan - 21-Nov-2015 2030    Clinical Impression Statement Patient continues to progress with exercises, requiring decreased hands on assistance as well as decreased cues for hand placements.  She has been consistent with performance of exercises at home for her home program and reports decreased need to use a modified technique with a chair for balance.  Will continue to work towards carryover into daily activities and progress with sit to stand from lower surfaces.   Pt will benefit from skilled therapeutic intervention in order to improve on the following deficits (Retired) Decreased knowledge of use of DME;Impaired flexibility;Pain;Decreased coordination;Decreased mobility;Decreased endurance;Decreased activity tolerance;Decreased range of motion;Decreased strength;Decreased balance;Difficulty walking;Impaired UE functional use   Rehab Potential Good   OT Frequency 4x / week   OT Duration 4 weeks   OT Treatment/Interventions Self-care/ADL training;Therapeutic exercise;Gait Training;Neuromuscular education;Stair Training;Functional Mobility Training;Patient/family education;DME and/or AE instruction;Balance training   Consulted and Agree with Plan of Care Patient          G-Codes - Nov 21, 2015 1921    Functional Assessment Tool Used 6 minute walk test, 5 times sit to stand, clinical judgment, ADL assessment   Functional Limitation Mobility:  Walking and moving around   Mobility: Walking and Moving Around Current Status [phone]) At least 40 percent but less than 60 percent impaired, limited or restricted   Mobility: Walking and Moving Around Goal Status [phone]) At least 20 percent but less than 40 percent impaired, limited or restricted      Problem List Patient Active Problem List   Diagnosis Date Noted  . Parkinson's disease ([place]) 08/25/2015  . Advanced care planning/counseling discussion 05/26/2015  . Family circumstance 02/23/2015  . Right foot pain 09/15/2014  . Health maintenance examination 05/21/2014  . MDD (major depressive disorder), single episode, moderate ([place]) 11/12/2013  . Dysuria 09/02/2013  . Back pain 05/31/2013  . Medicare annual wellness visit, subsequent 05/20/2013  . HLD (hyperlipidemia) 05/20/2013  . Vitamin D deficiency 05/09/2013  . Tremor 07/15/2012  . Recurrent UTI 01/03/2011  . Syncope 01/03/2011  . RASH-NONVESICULAR 05/11/2010  . GERD 02/22/2010  . Reflex sympathetic dystrophy 02/08/2010  . CARDIOMYOPATHY 02/08/2010  . CAD (coronary artery disease) 01/25/2010  . Osteoporosis 11/10/2009  . HYPOTENSION, ORTHOSTATIC 12/06/2008  . Anemia 09/25/2007  . B12 deficiency 05/03/2007  . Fibromyalgia 05/03/2007   Jeanean Hollett T Tomasita Morrow, OTR/L, CLT  Jayden Kratochvil 11/10/2015, 9:12 AM  [place] MAIN [facility] SERVICES [place], [place], [address] Phone: [phone]   Fax:  [phone]  Name: [name] MRN: [id number] Date of Birth: 09/23/1946

## 2015-11-11 ENCOUNTER — Ambulatory Visit: Payer: Medicare Other | Admitting: Occupational Therapy

## 2015-11-11 ENCOUNTER — Encounter: Payer: Self-pay | Admitting: Occupational Therapy

## 2015-11-11 DIAGNOSIS — M6281 Muscle weakness (generalized): Secondary | ICD-10-CM

## 2015-11-11 DIAGNOSIS — R2681 Unsteadiness on feet: Secondary | ICD-10-CM

## 2015-11-11 DIAGNOSIS — R262 Difficulty in walking, not elsewhere classified: Secondary | ICD-10-CM

## 2015-11-11 DIAGNOSIS — Z741 Need for assistance with personal care: Secondary | ICD-10-CM

## 2015-11-11 DIAGNOSIS — R279 Unspecified lack of coordination: Secondary | ICD-10-CM

## 2015-11-11 DIAGNOSIS — R460 Very low level of personal hygiene: Secondary | ICD-10-CM

## 2015-11-11 NOTE — Therapy (Signed)
[place] MAIN [facility] SERVICES [address] [place], [place], [address] Phone: [phone]   Fax:  [phone]  Occupational Therapy Treatment  Patient Details  Name: [name] MRN: [id number] Date of Birth: [birth year] No Data Recorded  Encounter Date: 11/10/2015      OT End of Session - 11/10/15 1633    Visit Number 12   Number of Visits 17   Date for OT Re-Evaluation 11/22/15   OT Start Time 1001   OT Stop Time 1100   OT Time Calculation (min) 59 min   Activity Tolerance Patient tolerated treatment well   Behavior During Therapy [facility] for tasks assessed/performed      Past Medical History  Diagnosis Date  . B12 DEFICIENCY 05/03/2007  . FIBROMYALGIA 05/03/2007  . ANEMIA-NOS 09/25/2007  . HYPERLIPIDEMIA 12/19/2007  . HYPOTENSION, ORTHOSTATIC 12/06/2008  . OSTEOPOROSIS 08/2009    bisphosphonate on hold 2/2 dysphagia, on reclast done in August each year  . 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 GERD 02/22/2010  . Interstitial cystitis     Ottelin now Dr Amalia Hailey  . CAD (coronary artery disease) 01/2010    MI, Nishan  . History of CHF (congestive heart failure) 01/2010  . Cardiomyopathy ([place]) 01/2010  . History of colon polyps 2004  . Takotsubo cardiomyopathy 2008    due to E coli urosepsis  . Glaucoma 02/2013    [facility]  . MCTD (mixed connective tissue disease) ([place]) 02/08/2010  . Lupus (systemic lupus erythematosus) ([place]) 02/08/2010  . Parkinson's disease ([place]) 08/25/2015    Dx Dr [name] 07/2015     Past Surgical History  Procedure Laterality Date  . Cholecystectomy      complication - low blood pressure  . Abdominal hysterectomy  1970s    IUD infection - first partial then with oophorectomy (cysts), complication - low blood pressure  . Colonoscopy  06/2008    h/o polyps but latest WNL, rec rpt 10 yrs Olevia Perches)  . Dexa  04/2013    T -2.9 @ femur, -1.6 @ spine  . Cystoscopy  12/2013    abx treatment for  recurrent cystitis    There were no vitals filed for this visit.  Visit Diagnosis:  Muscle weakness (generalized)  Lack of coordination  Self-care deficit for bathing and hygiene  Difficulty walking  Unsteady gait      Subjective Assessment - 11/11/15 1008    Subjective  Reports her arm is more achy because of the cold weather and still not sleeping good.     Patient Stated Goals Patient reports she wants to be independent, get better at basic tasks.    Currently in Pain? Yes   Pain Score 5    Pain Location Arm   Pain Orientation Right   Pain Descriptors / Indicators Aching   Pain Type Chronic pain   Pain Onset More than a month ago   Multiple Pain Sites No                      OT Treatments/Exercises (OP) - 11/10/15 2106    ADLs   ADL Comments Patient seen for functional component tasks as follows: sit to stand, negotiation of steps, reaching overhead for prolonged periods to perform hair care, picking up dishes to wash and put away, tub transfers   Neurological Re-education Exercises   Other Exercises 1 Patient seen for LSVT BIG exercises: LSVT Daily  Session Maximal Daily Exercises: Sustained movements are designed to rescale the amplitude of movement output for generalization to daily functional activities. Performed as follows for 1 set of 10 repetitions each: Multi directional sustained movements- 1) Floor to ceiling, 2) Side to side. Multi directional Repetitive movements performed in standing and are designed to provide retraining effort needed for sustained muscle activation in tasks Performed as follows: 3) Step and reach forward, 4) Step and Reach Backwards, 5) Step and reach sideways, 6) Rock and reach forward/backward, 7) Rock and reach sideways. Sit to stand from mat table on lowest setting with minimal cues for weight shift this date and performing 10 reps without rest break. Patient seen for functional mobility tasks this date with emphasis on gait  speed, length of steps with short distance ambulation. Patient requires cues for BIG movements and cadence. Select standing exercises performed this date with CGA, verbal and tactile cues as needed.    Other Exercises 2 Stair negotiation with cues for reciprocal stepping patterns and weight shifting as well as increasing amplitude of steps. Toe taps on stairs for 10 reps each reciprocal patterns with cues. functional mobility for 800 feet this date with cues for amplitude of steps and moderate cues for reciprocal arm swing with distractions.                OT Education - 11/10/15 1632    Education provided Yes   Education Details maximal daily exercises without the use of a chair   Person(s) Educated Patient   Methods Explanation;Demonstration;Verbal cues   Comprehension Verbal cues required;Returned demonstration;Verbalized understanding             OT Long Term Goals - 11/09/15 1919    OT LONG TERM GOAL #1   Title Patient will improve gait speed and endurance and be able to ambulate 600 feet in 6 minutes to negotiate around the home and community safely in 4 weeks.   Baseline 370 feet at evaluation, 540 at 10th visit.   Time 4   Period Weeks   Status Revised   OT LONG TERM GOAL #2   Title Patient will complete HEP for maximal daily exercises with modified independence in 4 weeks   Time 4   Status On-going   OT LONG TERM GOAL #3   Title Patient will transfer from sit to stand without the use of arms safely and independently from a variety of chairs/surfaces in 4 weeks.   Baseline unable to complete sit to stand without the use of arms at eval, 20 secs at 10th visit, will progress to other surfaces.    Time 4   Period Weeks   Status On-going   OT LONG TERM GOAL #4   Title Patient will decrease frequency of freezing episodes with score of 9 or less on Freezing of Gait questionairre   Baseline score of 11 at eval   Time 4   Period Weeks   Status On-going   OT LONG  TERM GOAL #5   Title Patient will complete basic self care tasks with modified independence.   Time 4   Period Weeks   Status On-going   OT LONG TERM GOAL #6   Title Patient will complete handwriting of name, address and phone number with 75% legibility and without signs of micrographia.   Time 4   Period Weeks   Status On-going               Plan - 11/10/15 1633  Clinical Impression Statement Patient has consistently increased her size of steps with functional gait and increasing distance each session.  Continues to require cues for reciprocal arm swing, starts off consistent but tends to reduce size of swing or evidence of swing on right as distance increases or when patient gets distracted.  Patient was able to demo sit to stand from lowest chair in the facility this date without the use of arms after several attempts and cues.     Pt will benefit from skilled therapeutic intervention in order to improve on the following deficits (Retired) Decreased knowledge of use of DME;Impaired flexibility;Pain;Decreased coordination;Decreased mobility;Decreased endurance;Decreased activity tolerance;Decreased range of motion;Decreased strength;Decreased balance;Difficulty walking;Impaired UE functional use   Rehab Potential Good   OT Frequency 4x / week   OT Duration 4 weeks   OT Treatment/Interventions Self-care/ADL training;Therapeutic exercise;Gait Training;Neuromuscular education;Stair Training;Functional Mobility Training;Patient/family education;DME and/or AE instruction;Balance training   Consulted and Agree with Plan of Care Patient        Problem List Patient Active Problem List   Diagnosis Date Noted  . Parkinson's disease ([place]) 08/25/2015  . Advanced care planning/counseling discussion 05/26/2015  . Family circumstance 02/23/2015  . Right foot pain 09/15/2014  . Health maintenance examination 05/21/2014  . MDD (major depressive disorder), single episode, moderate ([place])  11/12/2013  . Dysuria 09/02/2013  . Back pain 05/31/2013  . Medicare annual wellness visit, subsequent 05/20/2013  . HLD (hyperlipidemia) 05/20/2013  . Vitamin D deficiency 05/09/2013  . Tremor 07/15/2012  . Recurrent UTI 01/03/2011  . Syncope 01/03/2011  . RASH-NONVESICULAR 05/11/2010  . GERD 02/22/2010  . Reflex sympathetic dystrophy 02/08/2010  . CARDIOMYOPATHY 02/08/2010  . CAD (coronary artery disease) 01/25/2010  . Osteoporosis 11/10/2009  . HYPOTENSION, ORTHOSTATIC 12/06/2008  . Anemia 09/25/2007  . B12 deficiency 05/03/2007  . Fibromyalgia 05/03/2007   Retal Tonkinson T Tomasita Morrow, OTR/L, CLT Ramani Riva 11/11/2015, 9:09 PM  [place] MAIN [facility] SERVICES [address] [place], [place], [address] Phone: [phone]   Fax:  [phone]  Name: [name] MRN: [id number] Date of Birth: 06/23/1946

## 2015-11-12 NOTE — Therapy (Signed)
[place] MAIN [facility] SERVICES [address] [place], [place], [address] Phone: [phone]   Fax:  [phone]  Occupational Therapy Treatment  Patient Details  Name: [name] MRN: [id number] Date of Birth: Oct 07, 1946 No Data Recorded  Encounter Date: 11/11/2015      OT End of Session - 11/12/15 0957    Visit Number 13   Number of Visits 17   Date for OT Re-Evaluation 11/22/15   OT Start Time 1000   OT Stop Time 1101   OT Time Calculation (min) 61 min   Activity Tolerance Patient tolerated treatment well   Behavior During Therapy [facility] for tasks assessed/performed      Past Medical History  Diagnosis Date  . B12 DEFICIENCY 05/03/2007  . FIBROMYALGIA 05/03/2007  . ANEMIA-NOS 09/25/2007  . HYPERLIPIDEMIA 12/19/2007  . HYPOTENSION, ORTHOSTATIC 12/06/2008  . OSTEOPOROSIS 08/2009    bisphosphonate on hold 2/2 dysphagia, on reclast done in August each year  . 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 GERD 02/22/2010  . Interstitial cystitis     Ottelin now Dr Amalia Hailey  . CAD (coronary artery disease) 01/2010    MI, Nishan  . History of CHF (congestive heart failure) 01/2010  . Cardiomyopathy ([place]) 01/2010  . History of colon polyps 2004  . Takotsubo cardiomyopathy 2008    due to E coli urosepsis  . Glaucoma 02/2013    [facility]  . MCTD (mixed connective tissue disease) ([place]) 02/08/2010  . Lupus (systemic lupus erythematosus) ([place]) 02/08/2010  . Parkinson's disease ([place]) 08/25/2015    Dx Dr [name] 07/2015     Past Surgical History  Procedure Laterality Date  . Cholecystectomy      complication - low blood pressure  . Abdominal hysterectomy  1970s    IUD infection - first partial then with oophorectomy (cysts), complication - low blood pressure  . Colonoscopy  06/2008    h/o polyps but latest WNL, rec rpt 10 yrs Olevia Perches)  . Dexa  04/2013    T -2.9 @ femur, -1.6 @ spine  . Cystoscopy  12/2013    abx treatment for  recurrent cystitis    There were no vitals filed for this visit.  Visit Diagnosis:  Muscle weakness (generalized)  Self-care deficit for bathing and hygiene  Lack of coordination  Difficulty walking  Unsteady gait      Subjective Assessment - 11/11/15 1008    Subjective  Reports her arm is more achy because of the cold weather and still not sleeping good.     Patient Stated Goals Patient reports she wants to be independent, get better at basic tasks.    Currently in Pain? Yes   Pain Score 5    Pain Location Arm   Pain Orientation Right   Pain Descriptors / Indicators Aching   Pain Type Chronic pain   Pain Onset More than a month ago   Multiple Pain Sites No                      OT Treatments/Exercises (OP) - 11/11/15 1300    ADLs   ADL Comments Patient seen for functional component tasks as follows: sit to stand, negotiation of steps, reaching overhead for prolonged periods to perform hair care, picking up dishes to wash and put away, tub transfers   Neurological Re-education Exercises   Other Exercises 1 Patient seen for LSVT BIG exercises: LSVT Daily  Session Maximal Daily Exercises: Sustained movements are designed to rescale the amplitude of movement output for generalization to daily functional activities. Performed as follows for 1 set of 10 repetitions each: Multi directional sustained movements- 1) Floor to ceiling, 2) Side to side. Multi directional Repetitive movements performed in standing and are designed to provide retraining effort needed for sustained muscle activation in tasks Performed as follows: 3) Step and reach forward, 4) Step and Reach Backwards, 5) Step and reach sideways, 6) Rock and reach forward/backward, 7) Rock and reach sideways. Sit to stand from mat table on lowest setting with minimal cues for weight shift this date and performing 10 reps without rest break. Patient seen for functional mobility tasks this date with emphasis on gait  speed, length of steps with short distance ambulation. Patient requires cues for BIG movements and cadence. Select standing exercises performed this date with CGA, verbal and tactile cues as needed, primarily with backwards step and rock and reach   Other Exercises 2 Stair negotiation with cues for reciprocal stepping patterns and weight shifting as well as increasing amplitude of steps. Toe taps on stairs for 10 reps each reciprocal patterns with cues. functional mobility for 800 feet this date with cues for amplitude of steps and moderate cues for reciprocal arm swing with distractions.                OT Education - 11/11/15 0957    Education provided Yes   Person(s) Educated Patient   Methods Demonstration;Explanation;Verbal cues   Comprehension Verbal cues required;Returned demonstration;Verbalized understanding             OT Long Term Goals - 11/09/15 1919    OT LONG TERM GOAL #1   Title Patient will improve gait speed and endurance and be able to ambulate 600 feet in 6 minutes to negotiate around the home and community safely in 4 weeks.   Baseline 370 feet at evaluation, 540 at 10th visit.   Time 4   Period Weeks   Status Revised   OT LONG TERM GOAL #2   Title Patient will complete HEP for maximal daily exercises with modified independence in 4 weeks   Time 4   Status On-going   OT LONG TERM GOAL #3   Title Patient will transfer from sit to stand without the use of arms safely and independently from a variety of chairs/surfaces in 4 weeks.   Baseline unable to complete sit to stand without the use of arms at eval, 20 secs at 10th visit, will progress to other surfaces.    Time 4   Period Weeks   Status On-going   OT LONG TERM GOAL #4   Title Patient will decrease frequency of freezing episodes with score of 9 or less on Freezing of Gait questionairre   Baseline score of 11 at eval   Time 4   Period Weeks   Status On-going   OT LONG TERM GOAL #5   Title  Patient will complete basic self care tasks with modified independence.   Time 4   Period Weeks   Status On-going   OT LONG TERM GOAL #6   Title Patient will complete handwriting of name, address and phone number with 75% legibility and without signs of micrographia.   Time 4   Period Weeks   Status On-going               Plan - 11/12/15 0957    Clinical Impression Statement Patient has  progressed to functional mobility consistently this week with 800 feet with minimal rest breaks, cues for reciprocal arm swing but able to demo carryover of BIG steps.  Good balance noted with stair negotiation and is now only requiring CGA for backwards step and rock and reach exercise in standing.  Good progress with sit to stand from lower surfaces this date.  Patient will continue with exercises 2x a day over the weekend.   Pt will benefit from skilled therapeutic intervention in order to improve on the following deficits (Retired) Decreased knowledge of use of DME;Impaired flexibility;Pain;Decreased coordination;Decreased mobility;Decreased endurance;Decreased activity tolerance;Decreased range of motion;Decreased strength;Decreased balance;Difficulty walking;Impaired UE functional use   Rehab Potential Good   OT Frequency 4x / week   OT Duration 4 weeks   OT Treatment/Interventions Self-care/ADL training;Therapeutic exercise;Gait Training;Neuromuscular education;Stair Training;Functional Mobility Training;Patient/family education;DME and/or AE instruction;Balance training   Consulted and Agree with Plan of Care Patient        Problem List Patient Active Problem List   Diagnosis Date Noted  . Parkinson's disease ([place]) 08/25/2015  . Advanced care planning/counseling discussion 05/26/2015  . Family circumstance 02/23/2015  . Right foot pain 09/15/2014  . Health maintenance examination 05/21/2014  . MDD (major depressive disorder), single episode, moderate ([place]) 11/12/2013  . Dysuria  09/02/2013  . Back pain 05/31/2013  . Medicare annual wellness visit, subsequent 05/20/2013  . HLD (hyperlipidemia) 05/20/2013  . Vitamin D deficiency 05/09/2013  . Tremor 07/15/2012  . Recurrent UTI 01/03/2011  . Syncope 01/03/2011  . RASH-NONVESICULAR 05/11/2010  . GERD 02/22/2010  . Reflex sympathetic dystrophy 02/08/2010  . CARDIOMYOPATHY 02/08/2010  . CAD (coronary artery disease) 01/25/2010  . Osteoporosis 11/10/2009  . HYPOTENSION, ORTHOSTATIC 12/06/2008  . Anemia 09/25/2007  . B12 deficiency 05/03/2007  . Fibromyalgia 05/03/2007   Amy T Tomasita Morrow, OTR/L, CLT  [place],Amy 11/12/2015, 10:00 AM  [place] MAIN [facility] SERVICES [address] [place], [place], [address] Phone: [phone]   Fax:  [phone]  Name: [name] MRN: [id number] Date of Birth: 1946/02/09

## 2015-11-15 ENCOUNTER — Ambulatory Visit: Payer: Medicare Other | Admitting: Occupational Therapy

## 2015-11-15 DIAGNOSIS — R460 Very low level of personal hygiene: Secondary | ICD-10-CM

## 2015-11-15 DIAGNOSIS — R262 Difficulty in walking, not elsewhere classified: Secondary | ICD-10-CM

## 2015-11-15 DIAGNOSIS — Z741 Need for assistance with personal care: Secondary | ICD-10-CM

## 2015-11-15 DIAGNOSIS — R2681 Unsteadiness on feet: Secondary | ICD-10-CM

## 2015-11-15 DIAGNOSIS — R279 Unspecified lack of coordination: Secondary | ICD-10-CM

## 2015-11-15 DIAGNOSIS — M6281 Muscle weakness (generalized): Secondary | ICD-10-CM | POA: Diagnosis not present

## 2015-11-16 ENCOUNTER — Ambulatory Visit: Payer: Medicare Other | Admitting: Occupational Therapy

## 2015-11-16 DIAGNOSIS — R279 Unspecified lack of coordination: Secondary | ICD-10-CM

## 2015-11-16 DIAGNOSIS — Z741 Need for assistance with personal care: Secondary | ICD-10-CM

## 2015-11-16 DIAGNOSIS — M6281 Muscle weakness (generalized): Secondary | ICD-10-CM

## 2015-11-16 DIAGNOSIS — R262 Difficulty in walking, not elsewhere classified: Secondary | ICD-10-CM

## 2015-11-16 DIAGNOSIS — R2681 Unsteadiness on feet: Secondary | ICD-10-CM

## 2015-11-16 DIAGNOSIS — R460 Very low level of personal hygiene: Secondary | ICD-10-CM

## 2015-11-16 NOTE — Therapy (Signed)
[place] MAIN [facility] SERVICES [address] [place], [place], [address] Phone: [phone]   Fax:  [phone]  Occupational Therapy Treatment  Patient Details  Name: [name] MRN: [id number] Date of Birth: September 18, 1946 No Data Recorded  Encounter Date: 11/15/2015      OT End of Session - 11/16/15 1541    Visit Number 14   Number of Visits 17   Date for OT Re-Evaluation 11/22/15   OT Start Time 1002   OT Stop Time 1100   OT Time Calculation (min) 58 min   Activity Tolerance Patient tolerated treatment well   Behavior During Therapy [facility] for tasks assessed/performed      Past Medical History  Diagnosis Date  . B12 DEFICIENCY 05/03/2007  . FIBROMYALGIA 05/03/2007  . ANEMIA-NOS 09/25/2007  . HYPERLIPIDEMIA 12/19/2007  . HYPOTENSION, ORTHOSTATIC 12/06/2008  . OSTEOPOROSIS 08/2009    bisphosphonate on hold 2/2 dysphagia, on reclast done in August each year  . 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 GERD 02/22/2010  . Interstitial cystitis     Ottelin now Dr Amalia Hailey  . CAD (coronary artery disease) 01/2010    MI, Nishan  . History of CHF (congestive heart failure) 01/2010  . Cardiomyopathy ([place]) 01/2010  . History of colon polyps 2004  . Takotsubo cardiomyopathy 2008    due to E coli urosepsis  . Glaucoma 02/2013    [facility]  . MCTD (mixed connective tissue disease) ([place]) 02/08/2010  . Lupus (systemic lupus erythematosus) ([place]) 02/08/2010  . Parkinson's disease ([place]) 08/25/2015    Dx Dr [name] 07/2015     Past Surgical History  Procedure Laterality Date  . Cholecystectomy      complication - low blood pressure  . Abdominal hysterectomy  1970s    IUD infection - first partial then with oophorectomy (cysts), complication - low blood pressure  . Colonoscopy  06/2008    h/o polyps but latest WNL, rec rpt 10 yrs Olevia Perches)  . Dexa  04/2013    T -2.9 @ femur, -1.6 @ spine  . Cystoscopy  12/2013    abx treatment for  recurrent cystitis    There were no vitals filed for this visit.  Visit Diagnosis:  Muscle weakness (generalized)  Self-care deficit for bathing and hygiene  Lack of coordination  Difficulty walking  Unsteady gait      Subjective Assessment - 11/16/15 1538    Subjective  Patient reports she cannot believe this is her last week of therapy, it has gone by really fast and she reports feeling like she has made good progress.    Patient Stated Goals Patient reports she wants to be independent, get better at basic tasks.    Currently in Pain? Yes   Pain Score 4    Pain Location Arm   Pain Orientation Right   Pain Descriptors / Indicators Aching   Pain Type Chronic pain   Pain Onset More than a month ago   Pain Frequency Constant   Multiple Pain Sites No                      OT Treatments/Exercises (OP) - 11/15/15 1539    ADLs   ADL Comments Patient seen for functional component tasks as follows: sit to stand, negotiation of steps, reaching overhead for prolonged periods to perform hair care, picking up dishes to wash and put away, tub transfers  Neurological Re-education Exercises   Other Exercises 1 Patient seen for LSVT BIG exercises: LSVT Daily Session Maximal Daily Exercises: Sustained movements are designed to rescale the amplitude of movement output for generalization to daily functional activities. Performed as follows for 1 set of 10 repetitions each: Multi directional sustained movements- 1) Floor to ceiling, 2) Side to side. Multi directional Repetitive movements performed in standing and are designed to provide retraining effort needed for sustained muscle activation in tasks Performed as follows: 3) Step and reach forward, 4) Step and Reach Backwards, 5) Step and reach sideways, 6) Rock and reach forward/backward, 7) Rock and reach sideways. Sit to stand from mat table on lowest setting with minimal cues for weight shift this date and performing 10 reps  without rest break. Patient seen for functional mobility tasks this date with emphasis on gait speed, length of steps with short distance ambulation. Patient requires cues for BIG movements and cadence. Progressed to SBA for exercises this date.   Other Exercises 2 Stair negotiation with cues for reciprocal stepping patterns and weight shifting as well as increasing amplitude of steps. Toe taps on stairs for 10 reps each reciprocal patterns with cues. functional mobility for 800 feet this date with cues for amplitude of steps and moderate cues for reciprocal arm swing with distractions.                OT Education - 11/16/15 1541    Education provided Yes   Education Details instructed on progression of exercises and how to advance with tasks at home.    Person(s) Educated Patient   Methods Explanation;Demonstration;Verbal cues   Comprehension Verbal cues required;Returned demonstration;Verbalized understanding             OT Long Term Goals - 11/09/15 1919    OT LONG TERM GOAL #1   Title Patient will improve gait speed and endurance and be able to ambulate 600 feet in 6 minutes to negotiate around the home and community safely in 4 weeks.   Baseline 370 feet at evaluation, 540 at 10th visit.   Time 4   Period Weeks   Status Revised   OT LONG TERM GOAL #2   Title Patient will complete HEP for maximal daily exercises with modified independence in 4 weeks   Time 4   Status On-going   OT LONG TERM GOAL #3   Title Patient will transfer from sit to stand without the use of arms safely and independently from a variety of chairs/surfaces in 4 weeks.   Baseline unable to complete sit to stand without the use of arms at eval, 20 secs at 10th visit, will progress to other surfaces.    Time 4   Period Weeks   Status On-going   OT LONG TERM GOAL #4   Title Patient will decrease frequency of freezing episodes with score of 9 or less on Freezing of Gait questionairre   Baseline  score of 11 at eval   Time 4   Period Weeks   Status On-going   OT LONG TERM GOAL #5   Title Patient will complete basic self care tasks with modified independence.   Time 4   Period Weeks   Status On-going   OT LONG TERM GOAL #6   Title Patient will complete handwriting of name, address and phone number with 75% legibility and without signs of micrographia.   Time 4   Period Weeks   Status On-going  Plan - 11/15/15 1542    Clinical Impression Statement Patient has continued to make significant progress in all areas of care.  She has improved her ROM of her left arm with exercises and pain has not increased.  She has progressed to lower surface heights for sit to stand and can perform 1-5 repetitions depending on the surface.  Will continue to challenge patient this week and prepare for discharge by the end of the week.    Pt will benefit from skilled therapeutic intervention in order to improve on the following deficits (Retired) Decreased knowledge of use of DME;Impaired flexibility;Pain;Decreased coordination;Decreased mobility;Decreased endurance;Decreased activity tolerance;Decreased range of motion;Decreased strength;Decreased balance;Difficulty walking;Impaired UE functional use   Rehab Potential Good   OT Frequency 4x / week   OT Duration 4 weeks   OT Treatment/Interventions Self-care/ADL training;Therapeutic exercise;Gait Training;Neuromuscular education;Stair Training;Functional Mobility Training;Patient/family education;DME and/or AE instruction;Balance training   Consulted and Agree with Plan of Care Patient        Problem List Patient Active Problem List   Diagnosis Date Noted  . Parkinson's disease ([place]) 08/25/2015  . Advanced care planning/counseling discussion 05/26/2015  . Family circumstance 02/23/2015  . Right foot pain 09/15/2014  . Health maintenance examination 05/21/2014  . MDD (major depressive disorder), single episode, moderate ([place])  11/12/2013  . Dysuria 09/02/2013  . Back pain 05/31/2013  . Medicare annual wellness visit, subsequent 05/20/2013  . HLD (hyperlipidemia) 05/20/2013  . Vitamin D deficiency 05/09/2013  . Tremor 07/15/2012  . Recurrent UTI 01/03/2011  . Syncope 01/03/2011  . RASH-NONVESICULAR 05/11/2010  . GERD 02/22/2010  . Reflex sympathetic dystrophy 02/08/2010  . CARDIOMYOPATHY 02/08/2010  . CAD (coronary artery disease) 01/25/2010  . Osteoporosis 11/10/2009  . HYPOTENSION, ORTHOSTATIC 12/06/2008  . Anemia 09/25/2007  . B12 deficiency 05/03/2007  . Fibromyalgia 05/03/2007   Jayelyn Barno T Tomasita Morrow, OTR/L, CLT  Jakye Mullens 11/16/2015, 3:45 PM  [place] MAIN [facility] SERVICES [address] [place], [place], [address] Phone: [phone]   Fax:  [phone]  Name: MELANIE GOWING MRN: [id number] Date of Birth: 1946/08/05

## 2015-11-17 ENCOUNTER — Ambulatory Visit: Payer: Medicare Other | Admitting: Occupational Therapy

## 2015-11-17 DIAGNOSIS — R2681 Unsteadiness on feet: Secondary | ICD-10-CM

## 2015-11-17 DIAGNOSIS — R262 Difficulty in walking, not elsewhere classified: Secondary | ICD-10-CM

## 2015-11-17 DIAGNOSIS — R460 Very low level of personal hygiene: Secondary | ICD-10-CM

## 2015-11-17 DIAGNOSIS — R279 Unspecified lack of coordination: Secondary | ICD-10-CM

## 2015-11-17 DIAGNOSIS — M6281 Muscle weakness (generalized): Secondary | ICD-10-CM | POA: Diagnosis not present

## 2015-11-17 DIAGNOSIS — Z741 Need for assistance with personal care: Secondary | ICD-10-CM

## 2015-11-17 NOTE — Therapy (Signed)
[place] MAIN [facility] SERVICES [address] [place], [place], [address] Phone: [phone]   Fax:  [phone]  Occupational Therapy Treatment  Patient Details  Name: [name] MRN: [id number] Date of Birth: [birth year] No Data Recorded  Encounter Date: 11/16/2015      OT End of Session - 11/16/15 1625    Visit Number 15   Number of Visits 17   Date for OT Re-Evaluation 11/22/15   OT Start Time 1001   OT Stop Time 1059   OT Time Calculation (min) 58 min   Activity Tolerance Patient tolerated treatment well   Behavior During Therapy [facility] for tasks assessed/performed      Past Medical History  Diagnosis Date  . B12 DEFICIENCY 05/03/2007  . FIBROMYALGIA 05/03/2007  . ANEMIA-NOS 09/25/2007  . HYPERLIPIDEMIA 12/19/2007  . HYPOTENSION, ORTHOSTATIC 12/06/2008  . OSTEOPOROSIS 08/2009    bisphosphonate on hold 2/2 dysphagia, on reclast done in August each year  . 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 GERD 02/22/2010  . Interstitial cystitis     Ottelin now Dr Amalia Hailey  . CAD (coronary artery disease) 01/2010    MI, Nishan  . History of CHF (congestive heart failure) 01/2010  . Cardiomyopathy ([place]) 01/2010  . History of colon polyps 2004  . Takotsubo cardiomyopathy 2008    due to E coli urosepsis  . Glaucoma 02/2013    [facility]  . MCTD (mixed connective tissue disease) ([place]) 02/08/2010  . Lupus (systemic lupus erythematosus) ([place]) 02/08/2010  . Parkinson's disease ([place]) 08/25/2015    Dx Dr [name] 07/2015     Past Surgical History  Procedure Laterality Date  . Cholecystectomy      complication - low blood pressure  . Abdominal hysterectomy  1970s    IUD infection - first partial then with oophorectomy (cysts), complication - low blood pressure  . Colonoscopy  06/2008    h/o polyps but latest WNL, rec rpt 10 yrs Olevia Perches)  . Dexa  04/2013    T -2.9 @ femur, -1.6 @ spine  . Cystoscopy  12/2013    abx treatment for  recurrent cystitis    There were no vitals filed for this visit.  Visit Diagnosis:  Muscle weakness (generalized)  Self-care deficit for bathing and hygiene  Lack of coordination  Difficulty walking  Unsteady gait      Subjective Assessment - 11/16/15 1620    Subjective  Patient reports she is really happy that she can use her right arm again, my hand is moving so much better now, I can't believe it.  I really feel good about getting up from the chair and the couch now."   Patient Stated Goals Patient reports she wants to be independent, get better at basic tasks.    Pain Score 4   Pain Location Arm   Pain Orientation Right   Pain Descriptors / Indicators Aching   Pain Type Chronic pain   Pain Onset More than a month ago   Pain Frequency Constant                      OT Treatments/Exercises (OP) - 11/16/15 1622    ADLs   ADL Comments Patient seen for functional component tasks as follows: sit to stand, negotiation of steps, reaching overhead for prolonged periods to perform hair care, picking up dishes to wash and put away, tub transfers   Neurological  Re-education Exercises   Other Exercises 1 Patient seen for LSVT BIG exercises: LSVT Daily Session Maximal Daily Exercises: Sustained movements are designed to rescale the amplitude of movement output for generalization to daily functional activities. Performed as follows for 1 set of 10 repetitions each: Multi directional sustained movements- 1) Floor to ceiling, 2) Side to side. Multi directional Repetitive movements performed in standing and are designed to provide retraining effort needed for sustained muscle activation in tasks Performed as follows: 3) Step and reach forward, 4) Step and Reach Backwards, 5) Step and reach sideways, 6) Rock and reach forward/backward, 7) Rock and reach sideways. Sit to stand from mat table on lowest setting with minimal cues for weight shift this date and performing 10 reps without  rest break. Patient seen for functional mobility tasks this date with emphasis on gait speed, length of steps with short distance ambulation. Patient requires cues for BIG movements and cadence. Progressed to SBA for exercises this date.   Other Exercises 2 Stair negotiation with cues for reciprocal stepping patterns and weight shifting as well as increasing amplitude of steps. Toe taps on stairs for 10 reps each reciprocal patterns with cues. functional mobility for 800 feet this date with cues for amplitude of steps and moderate cues for reciprocal arm swing with distractions.  Increased distractions during exercises and advanced exercises to add in hand flicks during maximal daily exercises.  Progressed to lower bench heights for sit to stand without the use of arms for multiple repetitions.                 OT Education - 11/16/15 1624    Education provided Yes   Education Details maximal daily exercises, addition of hand flicks, lower surface heights for sit to stand.   Person(s) Educated Patient   Methods Explanation;Demonstration;Verbal cues   Comprehension Verbal cues required;Returned demonstration;Verbalized understanding             OT Long Term Goals - 11/09/15 1919    OT LONG TERM GOAL #1   Title Patient will improve gait speed and endurance and be able to ambulate 600 feet in 6 minutes to negotiate around the home and community safely in 4 weeks.   Baseline 370 feet at evaluation, 540 at 10th visit.   Time 4   Period Weeks   Status Revised   OT LONG TERM GOAL #2   Title Patient will complete HEP for maximal daily exercises with modified independence in 4 weeks   Time 4   Status On-going   OT LONG TERM GOAL #3   Title Patient will transfer from sit to stand without the use of arms safely and independently from a variety of chairs/surfaces in 4 weeks.   Baseline unable to complete sit to stand without the use of arms at eval, 20 secs at 10th visit, will progress  to other surfaces.    Time 4   Period Weeks   Status On-going   OT LONG TERM GOAL #4   Title Patient will decrease frequency of freezing episodes with score of 9 or less on Freezing of Gait questionairre   Baseline score of 11 at eval   Time 4   Period Weeks   Status On-going   OT LONG TERM GOAL #5   Title Patient will complete basic self care tasks with modified independence.   Time 4   Period Weeks   Status On-going   OT LONG TERM GOAL #6   Title Patient will  complete handwriting of name, address and phone number with 75% legibility and without signs of micrographia.   Time 4   Period Weeks   Status On-going               Plan - 11/16/15 1625    Clinical Impression Statement Patient has been able to perform sit to stand from lower surface heights this week more consistently and has been working on getting up from the sofa without the use of arms as demonstrated in treatment sessions.  Patient is performing maximal daily exercises with SBA to supervision now and still has a chair nearby at home when performing at home.  Will plan to discharge patient on Thursday with the completion of the LSVT BIG treatment protocol.  Will reassess patients progress on last session.     Pt will benefit from skilled therapeutic intervention in order to improve on the following deficits (Retired) Decreased knowledge of use of DME;Impaired flexibility;Pain;Decreased coordination;Decreased mobility;Decreased endurance;Decreased activity tolerance;Decreased range of motion;Decreased strength;Decreased balance;Difficulty walking;Impaired UE functional use   Rehab Potential Good   OT Frequency 4x / week   OT Duration 4 weeks   OT Treatment/Interventions Self-care/ADL training;Therapeutic exercise;Gait Training;Neuromuscular education;Stair Training;Functional Mobility Training;Patient/family education;DME and/or AE instruction;Balance training   Consulted and Agree with Plan of Care Patient         Problem List Patient Active Problem List   Diagnosis Date Noted  . Parkinson's disease ([place]) 08/25/2015  . Advanced care planning/counseling discussion 05/26/2015  . Family circumstance 02/23/2015  . Right foot pain 09/15/2014  . Health maintenance examination 05/21/2014  . MDD (major depressive disorder), single episode, moderate ([place]) 11/12/2013  . Dysuria 09/02/2013  . Back pain 05/31/2013  . Medicare annual wellness visit, subsequent 05/20/2013  . HLD (hyperlipidemia) 05/20/2013  . Vitamin D deficiency 05/09/2013  . Tremor 07/15/2012  . Recurrent UTI 01/03/2011  . Syncope 01/03/2011  . RASH-NONVESICULAR 05/11/2010  . GERD 02/22/2010  . Reflex sympathetic dystrophy 02/08/2010  . CARDIOMYOPATHY 02/08/2010  . CAD (coronary artery disease) 01/25/2010  . Osteoporosis 11/10/2009  . HYPOTENSION, ORTHOSTATIC 12/06/2008  . Anemia 09/25/2007  . B12 deficiency 05/03/2007  . Fibromyalgia 05/03/2007   Amy T Tomasita Morrow, OTR/L, CLT  [place],Amy 11/17/2015, 4:31 PM  [place] MAIN [facility] SERVICES [address] [place], [place], [address] Phone: [phone]   Fax:  [phone]  Name: [name] MRN: [id number] Date of Birth: January 28, 1946

## 2015-11-18 ENCOUNTER — Ambulatory Visit: Payer: Medicare Other | Admitting: Occupational Therapy

## 2015-11-18 DIAGNOSIS — M6281 Muscle weakness (generalized): Secondary | ICD-10-CM | POA: Diagnosis not present

## 2015-11-18 DIAGNOSIS — R2681 Unsteadiness on feet: Secondary | ICD-10-CM

## 2015-11-18 DIAGNOSIS — Z741 Need for assistance with personal care: Secondary | ICD-10-CM

## 2015-11-18 DIAGNOSIS — R279 Unspecified lack of coordination: Secondary | ICD-10-CM

## 2015-11-18 DIAGNOSIS — R262 Difficulty in walking, not elsewhere classified: Secondary | ICD-10-CM

## 2015-11-18 DIAGNOSIS — R460 Very low level of personal hygiene: Secondary | ICD-10-CM

## 2015-11-18 NOTE — Therapy (Signed)
[place] MAIN [facility] SERVICES [address] [place], [place], [address] Phone: [phone]   Fax:  [phone]  Occupational Therapy Treatment  Patient Details  Name: [name] MRN: [id number] Date of Birth: [birth year] No Data Recorded  Encounter Date: 11/17/2015      OT End of Session - 11/17/15 1043    Visit Number 16   Number of Visits 17   Date for OT Re-Evaluation 11/22/15   OT Start Time 1000   OT Stop Time 1100   OT Time Calculation (min) 60 min   Activity Tolerance Patient tolerated treatment well   Behavior During Therapy [facility] for tasks assessed/performed      Past Medical History  Diagnosis Date  . B12 DEFICIENCY 05/03/2007  . FIBROMYALGIA 05/03/2007  . ANEMIA-NOS 09/25/2007  . HYPERLIPIDEMIA 12/19/2007  . HYPOTENSION, ORTHOSTATIC 12/06/2008  . OSTEOPOROSIS 08/2009    bisphosphonate on hold 2/2 dysphagia, on reclast done in August each year  . 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 GERD 02/22/2010  . Interstitial cystitis     Ottelin now Dr Amalia Hailey  . CAD (coronary artery disease) 01/2010    MI, Nishan  . History of CHF (congestive heart failure) 01/2010  . Cardiomyopathy ([place]) 01/2010  . History of colon polyps 2004  . Takotsubo cardiomyopathy 2008    due to E coli urosepsis  . Glaucoma 02/2013    [facility]  . MCTD (mixed connective tissue disease) ([place]) 02/08/2010  . Lupus (systemic lupus erythematosus) ([place]) 02/08/2010  . Parkinson's disease ([place]) 08/25/2015    Dx Dr [name] 07/2015     Past Surgical History  Procedure Laterality Date  . Cholecystectomy      complication - low blood pressure  . Abdominal hysterectomy  1970s    IUD infection - first partial then with oophorectomy (cysts), complication - low blood pressure  . Colonoscopy  06/2008    h/o polyps but latest WNL, rec rpt 10 yrs Olevia Perches)  . Dexa  04/2013    T -2.9 @ femur, -1.6 @ spine  . Cystoscopy  12/2013    abx treatment for  recurrent cystitis    There were no vitals filed for this visit.  Visit Diagnosis:  Muscle weakness (generalized)  Self-care deficit for bathing and hygiene  Lack of coordination  Difficulty walking  Unsteady gait      Subjective Assessment - 11/17/15 1033    Subjective  Patient reports she is planning to do some shopping today and maybe take her grandkids out for a bit.  Trying to finish up her Christmas shopping.  Happy that she can move better now to get around the stores.  Enjoys getting out now.   Patient Stated Goals Patient reports she wants to be independent, get better at basic tasks.    Currently in Pain? Yes   Pain Score 5    Pain Location --  all over achiness from arthritis    Pain Descriptors / Indicators Aching   Pain Type Chronic pain   Pain Onset More than a month ago   Multiple Pain Sites No                      OT Treatments/Exercises (OP) - 11/17/15 1200    ADLs   ADL Comments Patient seen for functional component tasks as follows: sit to stand, negotiation of steps, reaching overhead for prolonged periods to perform hair  care, picking up dishes to wash and put away, tub transfers   Neurological Re-education Exercises   Other Exercises 1 Patient seen for LSVT BIG exercises: LSVT Daily Session Maximal Daily Exercises: Sustained movements are designed to rescale the amplitude of movement output for generalization to daily functional activities. Performed as follows for 1 set of 10 repetitions each: Multi directional sustained movements- 1) Floor to ceiling, 2) Side to side. Multi directional Repetitive movements performed in standing and are designed to provide retraining effort needed for sustained muscle activation in tasks Performed as follows: 3) Step and reach forward, 4) Step and Reach Backwards, 5) Step and reach sideways, 6) Rock and reach forward/backward, 7) Rock and reach sideways. Sit to stand from mat table on lowest setting with minimal  cues for weight shift this date and performing 10 reps without rest break. Patient seen for functional mobility tasks this date with emphasis on gait speed, length of steps with short distance ambulation. Patient requires cues for BIG movements and cadence. Progressed to  supervision for exercises this date.   Other Exercises 2 Stair negotiation with cues for reciprocal stepping patterns and weight shifting as well as increasing amplitude of steps. Toe taps on stairs for 10 reps each reciprocal patterns with cues. functional mobility for 1000 feet this date with cues for amplitude of steps and moderate cues for reciprocal arm swing with distractions. Increased distractions during exercises and advanced exercises to add in hand flicks during maximal daily exercises. Progressed to lower bench heights for sit to stand without the use of arms for multiple repetitions.                 OT Education - 11/17/15 1042    Education provided Yes   Education Details HEP, functional component tasks.     Person(s) Educated Patient   Methods Explanation;Demonstration;Verbal cues   Comprehension Verbal cues required;Returned demonstration;Verbalized understanding             OT Long Term Goals - 11/09/15 1919    OT LONG TERM GOAL #1   Title Patient will improve gait speed and endurance and be able to ambulate 600 feet in 6 minutes to negotiate around the home and community safely in 4 weeks.   Baseline 370 feet at evaluation, 540 at 10th visit.   Time 4   Period Weeks   Status Revised   OT LONG TERM GOAL #2   Title Patient will complete HEP for maximal daily exercises with modified independence in 4 weeks   Time 4   Status On-going   OT LONG TERM GOAL #3   Title Patient will transfer from sit to stand without the use of arms safely and independently from a variety of chairs/surfaces in 4 weeks.   Baseline unable to complete sit to stand without the use of arms at eval, 20 secs at 10th visit,  will progress to other surfaces.    Time 4   Period Weeks   Status On-going   OT LONG TERM GOAL #4   Title Patient will decrease frequency of freezing episodes with score of 9 or less on Freezing of Gait questionairre   Baseline score of 11 at eval   Time 4   Period Weeks   Status On-going   OT LONG TERM GOAL #5   Title Patient will complete basic self care tasks with modified independence.   Time 4   Period Weeks   Status On-going   OT LONG TERM GOAL #6  Title Patient will complete handwriting of name, address and phone number with 75% legibility and without signs of micrographia.   Time 4   Period Weeks   Status On-going               Plan - 11/17/15 1200    Clinical Impression Statement Patient is preparing for discharge next date will perform reassessment of skills and finalize home program.  Patient has made excellent progress throughout therapy episode of care and demonstrates improvements in all areas of strength, ROM, coordination, self care, transfers and functional mobility tasks.     Pt will benefit from skilled therapeutic intervention in order to improve on the following deficits (Retired) Decreased knowledge of use of DME;Impaired flexibility;Pain;Decreased coordination;Decreased mobility;Decreased endurance;Decreased activity tolerance;Decreased range of motion;Decreased strength;Decreased balance;Difficulty walking;Impaired UE functional use   Rehab Potential Good   OT Frequency 4x / week   OT Duration 4 weeks   OT Treatment/Interventions Self-care/ADL training;Therapeutic exercise;Gait Training;Neuromuscular education;Stair Training;Functional Mobility Training;Patient/family education;DME and/or AE instruction;Balance training   Consulted and Agree with Plan of Care Patient        Problem List Patient Active Problem List   Diagnosis Date Noted  . Parkinson's disease ([place]) 08/25/2015  . Advanced care planning/counseling discussion 05/26/2015  . Family  circumstance 02/23/2015  . Right foot pain 09/15/2014  . Health maintenance examination 05/21/2014  . MDD (major depressive disorder), single episode, moderate ([place]) 11/12/2013  . Dysuria 09/02/2013  . Back pain 05/31/2013  . Medicare annual wellness visit, subsequent 05/20/2013  . HLD (hyperlipidemia) 05/20/2013  . Vitamin D deficiency 05/09/2013  . Tremor 07/15/2012  . Recurrent UTI 01/03/2011  . Syncope 01/03/2011  . RASH-NONVESICULAR 05/11/2010  . GERD 02/22/2010  . Reflex sympathetic dystrophy 02/08/2010  . CARDIOMYOPATHY 02/08/2010  . CAD (coronary artery disease) 01/25/2010  . Osteoporosis 11/10/2009  . HYPOTENSION, ORTHOSTATIC 12/06/2008  . Anemia 09/25/2007  . B12 deficiency 05/03/2007  . Fibromyalgia 05/03/2007   Amy T Tomasita Morrow, OTR/L, CLT Lovett,Amy 11/18/2015, 11:40 AM  [place] MAIN [facility] SERVICES [place], [place], [address] Phone: [phone]   Fax:  [phone]  Name: TAMRAH PINKETT MRN: [id number] Date of Birth: 1946-02-16

## 2015-11-19 NOTE — Therapy (Signed)
[place] MAIN [facility] SERVICES [address] [place], [place], [address] Phone: [phone]   Fax:  [phone]  Occupational Therapy Treatment/Discharge Summary  Patient Details  Name: [name] MRN: [id number] Date of Birth: 04/01/1946 No Data Recorded  Patient seen from 10/19/15 to 11/18/15  Encounter Date: 11/18/2015      OT End of Session - 11/19/15 1546    Visit Number 17   Number of Visits 17   Date for OT Re-Evaluation 11/22/15   OT Start Time 1001   OT Stop Time 1100   OT Time Calculation (min) 59 min   Activity Tolerance Patient tolerated treatment well   Behavior During Therapy [facility] for tasks assessed/performed      Past Medical History  Diagnosis Date  . B12 DEFICIENCY 05/03/2007  . FIBROMYALGIA 05/03/2007  . ANEMIA-NOS 09/25/2007  . HYPERLIPIDEMIA 12/19/2007  . HYPOTENSION, ORTHOSTATIC 12/06/2008  . OSTEOPOROSIS 08/2009    bisphosphonate on hold 2/2 dysphagia, on reclast done in August each year  . 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 GERD 02/22/2010  . Interstitial cystitis     Ottelin now Dr Amalia Hailey  . CAD (coronary artery disease) 01/2010    MI, Nishan  . History of CHF (congestive heart failure) 01/2010  . Cardiomyopathy ([place]) 01/2010  . History of colon polyps 2004  . Takotsubo cardiomyopathy 2008    due to E coli urosepsis  . Glaucoma 02/2013    [facility]  . MCTD (mixed connective tissue disease) ([place]) 02/08/2010  . Lupus (systemic lupus erythematosus) ([place]) 02/08/2010  . Parkinson's disease ([place]) 08/25/2015    Dx Dr [name] 07/2015     Past Surgical History  Procedure Laterality Date  . Cholecystectomy      complication - low blood pressure  . Abdominal hysterectomy  1970s    IUD infection - first partial then with oophorectomy (cysts), complication - low blood pressure  . Colonoscopy  06/2008    h/o polyps but latest WNL, rec rpt 10 yrs Olevia Perches)  . Dexa  04/2013    T -2.9 @  femur, -1.6 @ spine  . Cystoscopy  12/2013    abx treatment for recurrent cystitis    There were no vitals filed for this visit.  Visit Diagnosis:  Muscle weakness (generalized)  Self-care deficit for bathing and hygiene  Lack of coordination  Difficulty walking  Unsteady gait      Subjective Assessment - 11/18/15 1346    Subjective  Patient reports she is happy she decided to come for therapy and can really see the benefit now.  She was surprised by her outcome measures and pleased with the progress she has made.  She is planning to continue to perform the exercises 2 times a day although she realizes she only has to do them a minimum of 1x a day.   Patient Stated Goals Patient reports she wants to be independent, get better at basic tasks.    Currently in Pain? Yes   Pain Score 4    Pain Location Arm   Pain Orientation Right   Pain Descriptors / Indicators Aching   Pain Type Chronic pain   Pain Onset More than a month ago   Multiple Pain Sites No                      OT Treatments/Exercises (OP) - 11/18/15 1540    ADLs  ADL Comments Final reassessment and review of functional component tasks and patient now able to complete all tasks with modified independence.  She has progressed to being able to perform a variety of heights for sit to stand including the lowest benches in the clinic and couch at home.   Neurological Re-education Exercises   Other Exercises 1 Patient seen for LSVT BIG exercises: LSVT Daily Session Maximal Daily Exercises: Sustained movements are designed to rescale the amplitude of movement output for generalization to daily functional activities. Performed as follows for 1 set of 10 repetitions each: Multi directional sustained movements- 1) Floor to ceiling, 2) Side to side. Multi directional Repetitive movements performed in standing and are designed to provide retraining effort needed for sustained muscle activation in tasks Performed as  follows: 3) Step and reach forward, 4) Step and Reach Backwards, 5) Step and reach sideways, 6) Rock and reach forward/backward, 7) Rock and reach sideways. Sit to stand from mat table on lowest setting with minimal cues for weight shift this date and performing 10 reps without rest break.   Other Exercises 2 Reassessment of outcome measures as follows:  5 times sit to stand 17 secs, previously unable to perform at evaluation.  Freezing of gait questionnaire score of 8.  6 minute walk test 1205 feet compared to only 370 feet at initial evaluation 4 weeks ago.                 OT Education - 11/18/15 1545    Education provided Yes   Education Details HEP with standard maximal daily exercises, how to grade exercises for increased difficulty over time, use of hand flicks for exercises and coordination, handwriting cues.    Methods Explanation;Demonstration   Comprehension Returned demonstration;Verbalized understanding             OT Long Term Goals - 11/19/15 1552    OT LONG TERM GOAL #1   Title Patient will improve gait speed and endurance and be able to ambulate 600 feet in 6 minutes to negotiate around the home and community safely in 4 weeks.   Baseline 370 feet at evaluation, 540 at 10th visit, 1205 feet at discharge   Time 4   Period Weeks   Status Achieved   OT LONG TERM GOAL #2   Title Patient will complete HEP for maximal daily exercises with modified independence in 4 weeks   Period Weeks   Status Achieved   OT LONG TERM GOAL #3   Title Patient will transfer from sit to stand without the use of arms safely and independently from a variety of chairs/surfaces in 4 weeks.   Baseline unable to complete sit to stand without the use of arms at eval, 20 secs at 10th visit, will progress to other surfaces, 17 secs at discharge.   Time 4   Status Achieved   OT LONG TERM GOAL #4   Title Patient will decrease frequency of freezing episodes with score of 9 or less on Freezing  of Gait questionairre   Baseline score of 11 at eval, 8 at discharge   Time 4   Period Weeks   Status Achieved   OT LONG TERM GOAL #5   Title Patient will complete basic self care tasks with modified independence.   Time 4   Period Weeks   Status Achieved   OT LONG TERM GOAL #6   Title Patient will complete handwriting of name, address and phone number with 75% legibility and without signs  of micrographia.   Time 4   Period Weeks   Status Achieved               Plan - 11/18/15 1547    Clinical Impression Statement Patient has completed the LSVT BIG program for Parkinson's patients consisting of 16 treatment visits.  She has made excellent progress and gains over the 4 week period of therapy and benefitted from the intensity of the program.  She attended all sessions as directed and showed consistent gains and carryover with exercises at home and is now independent with her home program.  She improved her 6 minute walk test from 370 feet to 1205 feet in 6 minutes in 4 weeks time.  She was unable to rise from a chair on evaluation without the significant use of her arms and effort and can now go from sit to stand from all surfaces in the facility without the use of arms.  She has decreased her score on freezing of gait questionairre and demonstrates decreased hesistations and freezing behaviors.  She is aware of the need to be consistent in participating in her exercises at home daily as well as continuing to engage in walking with utilizing BIG steps daily.  She may require additional therapy in the future as her disease progresses.  Please reconsult [place] in the future if the need arises.  Research also shows that patients who complete the program every 6-12 months have greater outcomes with reduced risk of falls. Patient has achieved all goals.   Pt will benefit from skilled therapeutic intervention in order to improve on the following deficits (Retired) Decreased knowledge of use of  DME;Impaired flexibility;Pain;Decreased coordination;Decreased mobility;Decreased endurance;Decreased activity tolerance;Decreased range of motion;Decreased strength;Decreased balance;Difficulty walking;Impaired UE functional use   Rehab Potential Good   OT Frequency 4x / week   OT Duration 4 weeks   OT Treatment/Interventions Self-care/ADL training;Therapeutic exercise;Gait Training;Neuromuscular education;Stair Training;Functional Mobility Training;Patient/family education;DME and/or AE instruction;Balance training   Consulted and Agree with Plan of Care Patient          G-Codes - 11/20/2015 1554    Functional Assessment Tool Used 6 minute walk test, 5 times sit to stand, clinical judgment, ADL assessment   Functional Limitation Mobility: Walking and moving around   Mobility: Walking and Moving Around Goal Status [phone]) At least 20 percent but less than 40 percent impaired, limited or restricted   Mobility: Walking and Moving Around Discharge Status [phone]) At least 1 percent but less than 20 percent impaired, limited or restricted      Problem List Patient Active Problem List   Diagnosis Date Noted  . Parkinson's disease ([place]) 08/25/2015  . Advanced care planning/counseling discussion 05/26/2015  . Family circumstance 02/23/2015  . Right foot pain 09/15/2014  . Health maintenance examination 05/21/2014  . MDD (major depressive disorder), single episode, moderate ([place]) 11/12/2013  . Dysuria 09/02/2013  . Back pain 05/31/2013  . Medicare annual wellness visit, subsequent 05/20/2013  . HLD (hyperlipidemia) 05/20/2013  . Vitamin D deficiency 05/09/2013  . Tremor 07/15/2012  . Recurrent UTI 01/03/2011  . Syncope 01/03/2011  . RASH-NONVESICULAR 05/11/2010  . GERD 02/22/2010  . Reflex sympathetic dystrophy 02/08/2010  . CARDIOMYOPATHY 02/08/2010  . CAD (coronary artery disease) 01/25/2010  . Osteoporosis 11/10/2009  . HYPOTENSION, ORTHOSTATIC 12/06/2008  . Anemia 09/25/2007  .  B12 deficiency 05/03/2007  . Fibromyalgia 05/03/2007   Helio Lack T Mirenda Baltazar, OTR/L, CLT Cartez Mogle 11/20/2015, 3:56 PM  [place] MAIN REHAB SERVICES  [place], [place], [address] Phone: [phone]   Fax:  [phone]  Name: [name] MRN: [id number] Date of Birth: 03-Jan-1946

## 2015-11-28 ENCOUNTER — Other Ambulatory Visit: Payer: Self-pay | Admitting: Family Medicine

## 2015-11-30 ENCOUNTER — Other Ambulatory Visit: Payer: Self-pay | Admitting: Family Medicine

## 2015-11-30 DIAGNOSIS — G20 Parkinson's disease: Secondary | ICD-10-CM

## 2015-11-30 DIAGNOSIS — D649 Anemia, unspecified: Secondary | ICD-10-CM

## 2015-11-30 DIAGNOSIS — E785 Hyperlipidemia, unspecified: Secondary | ICD-10-CM

## 2015-11-30 NOTE — Telephone Encounter (Signed)
plz phone in. 

## 2015-11-30 NOTE — Telephone Encounter (Signed)
Rx called in as directed.   

## 2015-12-01 ENCOUNTER — Other Ambulatory Visit (INDEPENDENT_AMBULATORY_CARE_PROVIDER_SITE_OTHER): Payer: Medicare Other

## 2015-12-01 DIAGNOSIS — E785 Hyperlipidemia, unspecified: Secondary | ICD-10-CM | POA: Diagnosis not present

## 2015-12-01 DIAGNOSIS — D649 Anemia, unspecified: Secondary | ICD-10-CM

## 2015-12-01 DIAGNOSIS — G20 Parkinson's disease: Secondary | ICD-10-CM

## 2015-12-01 LAB — CBC WITH DIFFERENTIAL/PLATELET
BASOS PCT: 0.7 % (ref 0.0–3.0)
Basophils Absolute: 0 10*3/uL (ref 0.0–0.1)
EOS ABS: 0.1 10*3/uL (ref 0.0–0.7)
Eosinophils Relative: 3.4 % (ref 0.0–5.0)
HCT: 34.1 % — ABNORMAL LOW (ref 36.0–46.0)
Hemoglobin: 11 g/dL — ABNORMAL LOW (ref 12.0–15.0)
Lymphocytes Relative: 47 % — ABNORMAL HIGH (ref 12.0–46.0)
Lymphs Abs: 1.6 10*3/uL (ref 0.7–4.0)
MCHC: 32.4 g/dL (ref 30.0–36.0)
MCV: 84 fl (ref 78.0–100.0)
MONO ABS: 0.3 10*3/uL (ref 0.1–1.0)
Monocytes Relative: 9.4 % (ref 3.0–12.0)
NEUTROS PCT: 39.5 % — AB (ref 43.0–77.0)
Neutro Abs: 1.4 10*3/uL (ref 1.4–7.7)
Platelets: 237 10*3/uL (ref 150.0–400.0)
RBC: 4.06 Mil/uL (ref 3.87–5.11)
RDW: 15.1 % (ref 11.5–15.5)
WBC: 3.5 10*3/uL — ABNORMAL LOW (ref 4.0–10.5)

## 2015-12-01 LAB — COMPREHENSIVE METABOLIC PANEL
ALBUMIN: 4 g/dL (ref 3.5–5.2)
ALK PHOS: 50 U/L (ref 39–117)
ALT: 3 U/L (ref 0–35)
AST: 17 U/L (ref 0–37)
BUN: 14 mg/dL (ref 6–23)
CO2: 30 mEq/L (ref 19–32)
CREATININE: 0.8 mg/dL (ref 0.40–1.20)
Calcium: 10.2 mg/dL (ref 8.4–10.5)
Chloride: 104 mEq/L (ref 96–112)
GFR: 75.94 mL/min (ref >60.00)
Glucose, Bld: 108 mg/dL — ABNORMAL HIGH (ref 70–99)
Potassium: 4.3 mEq/L (ref 3.5–5.1)
SODIUM: 141 meq/L (ref 135–145)
TOTAL PROTEIN: 6.8 g/dL (ref 6.0–8.3)
Total Bilirubin: 0.5 mg/dL (ref 0.2–1.2)

## 2015-12-01 LAB — LIPID PANEL
Cholesterol: 185 mg/dL (ref 0–200)
HDL: 77.8 mg/dL (ref >39.00)
LDL Cholesterol: 99 mg/dL (ref 0–99)
NonHDL: 106.71
TRIGLYCERIDES: 39 mg/dL (ref 0.0–149.0)
Total CHOL/HDL Ratio: 2
VLDL: 7.8 mg/dL (ref 0.0–40.0)

## 2015-12-01 LAB — IBC PANEL
IRON: 68 ug/dL (ref 42–145)
Saturation Ratios: 14.4 % — ABNORMAL LOW (ref 20.0–50.0)
TRANSFERRIN: 337 mg/dL (ref 212.0–360.0)

## 2015-12-01 LAB — FERRITIN: Ferritin: 41.6 ng/mL (ref 10.0–291.0)

## 2015-12-08 ENCOUNTER — Encounter: Payer: Self-pay | Admitting: Family Medicine

## 2015-12-08 ENCOUNTER — Ambulatory Visit (INDEPENDENT_AMBULATORY_CARE_PROVIDER_SITE_OTHER): Payer: Medicare Other | Admitting: Family Medicine

## 2015-12-08 ENCOUNTER — Ambulatory Visit (INDEPENDENT_AMBULATORY_CARE_PROVIDER_SITE_OTHER)
Admission: RE | Admit: 2015-12-08 | Discharge: 2015-12-08 | Disposition: A | Discharge status: Home / Self Care | Payer: Medicare Other | Source: Ambulatory Visit | Attending: Family Medicine | Admitting: Family Medicine

## 2015-12-08 VITALS — BP 92/58 | HR 77 | Temp 97.6°F | Wt 127.0 lb

## 2015-12-08 DIAGNOSIS — M7662 Achilles tendinitis, left leg: Secondary | ICD-10-CM

## 2015-12-08 DIAGNOSIS — E538 Deficiency of other specified B group vitamins: Secondary | ICD-10-CM

## 2015-12-08 DIAGNOSIS — F321 Major depressive disorder, single episode, moderate: Secondary | ICD-10-CM | POA: Diagnosis not present

## 2015-12-08 DIAGNOSIS — D649 Anemia, unspecified: Secondary | ICD-10-CM | POA: Diagnosis not present

## 2015-12-08 DIAGNOSIS — G20A1 Parkinson's disease without dyskinesia, without mention of fluctuations: Secondary | ICD-10-CM

## 2015-12-08 DIAGNOSIS — G20 Parkinson's disease: Secondary | ICD-10-CM

## 2015-12-08 DIAGNOSIS — E785 Hyperlipidemia, unspecified: Secondary | ICD-10-CM

## 2015-12-08 MED ORDER — CYANOCOBALAMIN 1000 MCG/ML IJ SOLN
1000.0000 ug | Freq: Once | INTRAMUSCULAR | Status: AC
  Administered 2015-12-08: 1000 ug via INTRAMUSCULAR

## 2015-12-08 MED ORDER — SERTRALINE HCL 50 MG PO TABS: 50.0000 mg | ORAL_TABLET | Freq: Every day | ORAL | Status: DC

## 2015-12-08 MED ORDER — FERROUS SULFATE 325 (65 FE) MG PO TABS: 325.0000 mg | ORAL_TABLET | ORAL | Status: DC

## 2015-12-08 NOTE — Assessment & Plan Note (Signed)
Ongoing family stressors and anhedonia. Increase sertraline to 50mg  daily. Noted improvement on lower dose.

## 2015-12-08 NOTE — Addendum Note (Signed)
Addended by: Alberton Lions on: 12/08/2015 10:54 AM   Modules accepted: Orders

## 2015-12-08 NOTE — Assessment & Plan Note (Signed)
B12 on latest check improved. Will need rechecked. b12 shot today. Gets Q2 mo, last 06/2015.

## 2015-12-08 NOTE — Progress Notes (Signed)
BP 92/58 mmHg  Pulse 77  Temp(Src) 97.6 F (36.4 C) (Oral)  Wt 127 lb (57.607 kg)  SpO2 97%   CC: 54mo f/u visit, L foot pain  Subjective:    Patient ID: [name], female    DOB: Aug 15, 1946, 70 y.o.   MRN: [id number]  HPI: [name] is a 70 y.o. female presenting on 12/08/2015 for Follow-up and Foot Pain   Recent PD dx by Dr Carles Collet 07/2015. Last seen 09/2015. Has been seeing neurorehab at [facility] - last session week of christmas. Rides stationary bicycle. Has HEP. Started on carbidopa/levodopa 25/100 TID. Has regained function of right hand, pain persists.  Notes lack of motivation to do anything. Stress at home. Daughter's family is living with her. Daughter lost job over Christmas. Planning on starting going to PD support group at [facility].   Noticing worsening left heel pain worse with prolonged walking (ie shopping or grocery store). This started about 2 months ago. Continues tramadol 50mg  bid + ibuprofen 800mg  once daily. Better with rest and elevation of left leg. Painful first few steps of day.   Relevant past medical, surgical, family and social history reviewed and updated as indicated. Interim medical history since our last visit reviewed. Allergies and medications reviewed and updated. Current Outpatient Prescriptions on File Prior to Visit  Medication Sig  . Calcium Carb-Cholecalciferol (CALCIUM + D3) 600-200 MG-UNIT TABS Take 2 tablets by mouth daily.  . carbidopa-levodopa (SINEMET IR) 25-100 MG tablet Take 1 tablet by mouth 3 (three) times daily.  . cholecalciferol (VITAMIN D) 1000 UNITS tablet Take 1,000 Units by mouth daily.  . clonazePAM (KLONOPIN) 0.5 MG tablet TAKE 1 TABLET BY MOUTH TWICE A DAY AS NEEDED  . cyanocobalamin 1000 MCG/ML injection Inject 1,000 mcg into the muscle every 8 (eight) weeks.   . gabapentin (NEURONTIN) 100 MG capsule Take 1 capsule (100 mg total) by mouth 2 (two) times daily.  Marland Kitchen latanoprost (XALATAN) 0.005 % ophthalmic solution Place 1  drop into both eyes at bedtime.  Marland Kitchen omeprazole (PRILOSEC) 40 MG capsule TAKE ONE CAPSULE BY MOUTH EVERY DAY  . polyethylene glycol (GLYCOLAX) packet Take 17 g by mouth as needed.    . pramipexole (MIRAPEX) 0.125 MG tablet 1 tablet three times daily for one week, then 2 tablets three times daily for one week, then fill 0.5 mg tablets  . pramipexole (MIRAPEX) 0.5 MG tablet Take 1 tablet (0.5 mg total) by mouth 3 (three) times daily.  Marland Kitchen senna (SENOKOT) 8.6 MG tablet Take 1 tablet by mouth daily.    . traMADol (ULTRAM) 50 MG tablet TAKE 1 TABLET BY MOUTH 3 TIMES A DAY AS NEEDED FOR PAIN  . Zoledronic Acid (RECLAST IV) Inject into the vein. Yearly, 06/2013   No current facility-administered medications on file prior to visit.    Review of Systems Per HPI unless specifically indicated in ROS section     Objective:    BP 92/58 mmHg  Pulse 77  Temp(Src) 97.6 F (36.4 C) (Oral)  Wt 127 lb (57.607 kg)  SpO2 97%  Wt Readings from Last 3 Encounters:  12/08/15 127 lb (57.607 kg)  10/28/15 129 lb (58.514 kg)  10/26/15 127 lb (57.607 kg)    Physical Exam  Constitutional: She appears well-developed and well-nourished. No distress.  Cardiovascular: Normal rate, regular rhythm, normal heart sounds and intact distal pulses.   No murmur heard. Pulmonary/Chest: Effort normal and breath sounds normal. No respiratory distress. She has no wheezes. She  has no rales.  Musculoskeletal: She exhibits edema (at achilles tendon and lateral/medial).  R foot WNL L foot tender to palpation at achilles tendon itself > at insertion, swelling around achilles tendon. No erythema or warmth. No deformity at achilles. Mildly + calcaneal squeeze test. Generalized tenderness to palpation entire LLE 1+ DP bilaterally  Neurological:  Improved R hand tremor noted  Skin: Skin is warm and dry. No rash noted. No erythema.  Psychiatric: She has a normal mood and affect.  Vitals reviewed.  Results for orders placed or  performed in visit on 12/01/15  Lipid panel  Result Value Ref Range   Cholesterol 185 0 - 200 mg/dL   Triglycerides 39.0 0.0 - 149.0 mg/dL   HDL 77.80 >39.00 mg/dL   VLDL 7.8 0.0 - 40.0 mg/dL   LDL Cholesterol 99 0 - 99 mg/dL   Total CHOL/HDL Ratio 2    NonHDL 106.71   Comprehensive metabolic panel  Result Value Ref Range   Sodium 141 135 - 145 mEq/L   Potassium 4.3 3.5 - 5.1 mEq/L   Chloride 104 96 - 112 mEq/L   CO2 30 19 - 32 mEq/L   Glucose, Bld 108 (H) 70 - 99 mg/dL   BUN 14 6 - 23 mg/dL   Creatinine, Ser 0.80 0.40 - 1.20 mg/dL   Total Bilirubin 0.5 0.2 - 1.2 mg/dL   Alkaline Phosphatase 50 39 - 117 U/L   AST 17 0 - 37 U/L   ALT 3 0 - 35 U/L   Total Protein 6.8 6.0 - 8.3 g/dL   Albumin 4.0 3.5 - 5.2 g/dL   Calcium 10.2 8.4 - 10.5 mg/dL   GFR 75.94 >60.00 mL/min  CBC with Differential/Platelet  Result Value Ref Range   WBC 3.5 (L) 4.0 - 10.5 K/uL   RBC 4.06 3.87 - 5.11 Mil/uL   Hemoglobin 11.0 (L) 12.0 - 15.0 g/dL   HCT 34.1 (L) 36.0 - 46.0 %   MCV 84.0 78.0 - 100.0 fl   MCHC 32.4 30.0 - 36.0 g/dL   RDW 15.1 11.5 - 15.5 %   Platelets 237.0 150.0 - 400.0 K/uL   Neutrophils Relative % 39.5 (L) 43.0 - 77.0 %   Lymphocytes Relative 47.0 (H) 12.0 - 46.0 %   Monocytes Relative 9.4 3.0 - 12.0 %   Eosinophils Relative 3.4 0.0 - 5.0 %   Basophils Relative 0.7 0.0 - 3.0 %   Neutro Abs 1.4 1.4 - 7.7 K/uL   Lymphs Abs 1.6 0.7 - 4.0 K/uL   Monocytes Absolute 0.3 0.1 - 1.0 K/uL   Eosinophils Absolute 0.1 0.0 - 0.7 K/uL   Basophils Absolute 0.0 0.0 - 0.1 K/uL  IBC panel  Result Value Ref Range   Iron 68 42 - 145 ug/dL   Transferrin 337.0 212.0 - 360.0 mg/dL   Saturation Ratios 14.4 (L) 20.0 - 50.0 %  Ferritin  Result Value Ref Range   Ferritin 41.6 10.0 - 291.0 ng/mL      Assessment & Plan:  Labs reviewed with patient. Problem List Items Addressed This Visit    Parkinson's disease ([place])    Overall improving. Appreciate neurology care.       MDD (major depressive  disorder), single episode, moderate ([place])    Ongoing family stressors and anhedonia. Increase sertraline to 50mg  daily. Noted improvement on lower dose.      Relevant Medications   sertraline (ZOLOFT) 50 MG tablet   Left Achilles tendinitis - Primary    Anticipate  tendonitis not bursitis, no haglund deformity. Treat with heel cushion, ibuprofen, rest, elevation. Handout provided on exercises from [facility] pt advisor. Update if not improving with treatment.       Relevant Orders   DG Foot Complete Left   HLD (hyperlipidemia)    Improved, stable off meds.       B12 deficiency    B12 on latest check improved. Will need rechecked. b12 shot today. Gets Q2 mo, last 06/2015.       Anemia    Mild with mild leukopenia. Iron sat low, otherwise iron studies ok. Will trial ferrous sulfate MWF for next few months and recheck levels. Pt agrees with plan.       Relevant Medications   ferrous sulfate 325 (65 FE) MG tablet       Follow up plan: Return in about 3 months (around 03/07/2016), or as needed, for follow up visit.

## 2015-12-08 NOTE — Addendum Note (Signed)
Addended by: [place] on: 12/08/2015 11:04 AM   Modules accepted: Orders

## 2015-12-08 NOTE — Assessment & Plan Note (Signed)
Mild with mild leukopenia. Iron sat low, otherwise iron studies ok. Will trial ferrous sulfate MWF for next few months and recheck levels. Pt agrees with plan.

## 2015-12-08 NOTE — Assessment & Plan Note (Signed)
Anticipate tendonitis not bursitis, no haglund deformity. Treat with heel cushion, ibuprofen, rest, elevation. Handout provided on exercises from [facility] pt advisor. Update if not improving with treatment.

## 2015-12-08 NOTE — Assessment & Plan Note (Signed)
Overall improving. Appreciate neurology care.

## 2015-12-08 NOTE — Progress Notes (Signed)
Pre visit review using our clinic review tool, if applicable. No additional management support is needed unless otherwise documented below in the visit note. 

## 2015-12-08 NOTE — Assessment & Plan Note (Signed)
Improved, stable off meds.

## 2015-12-08 NOTE — Patient Instructions (Addendum)
Increase sertraline to 50mg  daily.  For left heel pain - xray today. I think you have achilles tendonitis - treat with elevation, rest, buy heel cushion (lift) and continue ibuprofen 800mg  once daily. Do exercises provided today.  Start iron (ferrous sulfate) 325mg  (65FE) one pill on Monday/ Wednesday/ Friday.  Return in 3 months for follow up. B12 shot today (Q2 months)

## 2015-12-27 ENCOUNTER — Encounter: Payer: Self-pay | Admitting: Cardiology

## 2015-12-27 ENCOUNTER — Other Ambulatory Visit: Payer: Self-pay | Admitting: Family Medicine

## 2015-12-27 NOTE — Telephone Encounter (Signed)
Ok to refill 

## 2015-12-28 NOTE — Telephone Encounter (Signed)
Rx called in as directed.   

## 2015-12-28 NOTE — Telephone Encounter (Signed)
plz phone in. 

## 2016-02-03 ENCOUNTER — Other Ambulatory Visit: Payer: Self-pay | Admitting: Family Medicine

## 2016-02-03 NOTE — Telephone Encounter (Signed)
Ok to refill 

## 2016-02-04 NOTE — Telephone Encounter (Signed)
plz phone in. 

## 2016-02-04 NOTE — Telephone Encounter (Signed)
Rx called in as directed.   

## 2016-02-23 ENCOUNTER — Ambulatory Visit (INDEPENDENT_AMBULATORY_CARE_PROVIDER_SITE_OTHER): Payer: Medicare Other | Admitting: Neurology

## 2016-02-23 ENCOUNTER — Encounter: Payer: Self-pay | Admitting: Neurology

## 2016-02-23 VITALS — BP 110/60 | HR 82 | Ht 65.0 in | Wt 138.0 lb

## 2016-02-23 DIAGNOSIS — G4700 Insomnia, unspecified: Secondary | ICD-10-CM

## 2016-02-23 DIAGNOSIS — G20 Parkinson's disease: Secondary | ICD-10-CM

## 2016-02-23 DIAGNOSIS — F331 Major depressive disorder, recurrent, moderate: Secondary | ICD-10-CM

## 2016-02-23 MED ORDER — CARBIDOPA-LEVODOPA ER 50-200 MG PO TBCR: 1.0000 | EXTENDED_RELEASE_TABLET | Freq: Every day | ORAL | Status: DC
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
## 2016-02-23 NOTE — Patient Instructions (Addendum)
[name] -  Address: [address] [place], [place] [address] Phone: [phone]  Start carbidopa/levodopa CR 50/200 at bedtime.  You can try melatonin for sleep, start with 3 mg dosage

## 2016-02-24 ENCOUNTER — Telehealth: Payer: Self-pay | Admitting: Neurology

## 2016-02-24 NOTE — Telephone Encounter (Signed)
Patient just seen yesterday. Please advise.

## 2016-02-24 NOTE — Telephone Encounter (Signed)
[employer] [id number]

## 2016-02-24 NOTE — Telephone Encounter (Signed)
Patient made aware.

## 2016-02-24 NOTE — Telephone Encounter (Signed)
[name] 06-28-2046. Her legs and feet have been hurting like"walking in rocks". She didn't know if she should see you or her PCP. She was not sure if this was due to her Parkinson's. She also wanted to know if it would be ok to drive to [place] in June for her grandson's graduation, she will have someone with her.

## 2016-02-24 NOTE — Telephone Encounter (Signed)
Doubt from parkinsons.  She doesn't have neuropathy as far as I know but I am happy to eval if she would like.  May want to start with PCP and if doesn't find cause to f/u with me.

## 2016-02-28 ENCOUNTER — Other Ambulatory Visit: Payer: Self-pay | Admitting: Family Medicine

## 2016-02-29 NOTE — Telephone Encounter (Signed)
Ok to refill 

## 2016-02-29 NOTE — Telephone Encounter (Signed)
plz phone in. 

## 2016-03-01 NOTE — Telephone Encounter (Signed)
Rx called in as directed.   

## 2016-03-07 ENCOUNTER — Encounter: Payer: Self-pay | Admitting: Family Medicine

## 2016-03-07 ENCOUNTER — Ambulatory Visit (INDEPENDENT_AMBULATORY_CARE_PROVIDER_SITE_OTHER): Payer: Medicare Other | Admitting: Family Medicine

## 2016-03-07 VITALS — BP 92/58 | HR 68 | Temp 97.8°F | Wt 138.5 lb

## 2016-03-07 DIAGNOSIS — D649 Anemia, unspecified: Secondary | ICD-10-CM | POA: Diagnosis not present

## 2016-03-07 DIAGNOSIS — E538 Deficiency of other specified B group vitamins: Secondary | ICD-10-CM

## 2016-03-07 DIAGNOSIS — Z639 Problem related to primary support group, unspecified: Secondary | ICD-10-CM

## 2016-03-07 DIAGNOSIS — F5104 Psychophysiologic insomnia: Secondary | ICD-10-CM

## 2016-03-07 DIAGNOSIS — F321 Major depressive disorder, single episode, moderate: Secondary | ICD-10-CM | POA: Diagnosis not present

## 2016-03-07 DIAGNOSIS — G4700 Insomnia, unspecified: Secondary | ICD-10-CM

## 2016-03-07 DIAGNOSIS — G20 Parkinson's disease: Secondary | ICD-10-CM

## 2016-03-07 HISTORY — DX: Psychophysiologic insomnia: F51.04

## 2016-03-07 MED ORDER — CYANOCOBALAMIN 1000 MCG/ML IJ SOLN
1000.0000 ug | Freq: Once | INTRAMUSCULAR | Status: AC
  Administered 2016-03-07: 1000 ug via INTRAMUSCULAR

## 2016-03-07 MED ORDER — CYANOCOBALAMIN 1000 MCG/ML IJ SOLN: 1000.0000 ug | Freq: Once | INTRAMUSCULAR | Status: DC

## 2016-03-07 NOTE — Assessment & Plan Note (Signed)
May try melatonin 6mg  nightly. Will need to review sleep hygiene measures next visit

## 2016-03-07 NOTE — Assessment & Plan Note (Addendum)
Check labs in 1 month after iron replacement MWF (lab visit only).

## 2016-03-07 NOTE — Patient Instructions (Addendum)
May try 2 melatonin at night time.  Continue sertraline 50mg  daily. Return in 1 month for lab visit only to check blood counts and b12 level. Return in 3 months for follow up visit.

## 2016-03-07 NOTE — Assessment & Plan Note (Signed)
b12 shot today. 

## 2016-03-07 NOTE — Progress Notes (Signed)
BP 92/58 mmHg  Pulse 68  Temp(Src) 97.8 F (36.6 C) (Oral)  Wt 138 lb 8 oz (62.823 kg)  SpO2 97%   CC: 3 mo f/u visit  Subjective:    Patient ID: [name], female    DOB: September 15, 1946, 70 y.o.   MRN: [id number]  HPI: [name] is a 70 y.o. female presenting on 03/07/2016 for 3 mos FU   PD - doing well, completed neurorehab at [facility]. Has HEP. On carbidopa/levodopa IR TID and CR nightly as well as pramipexole TID. Improved function of right hand.   Worsening depression - last visit we increased sertraline to 50mg  daily. Living with daughter and grandchildren. Planning on seeing counselor as well ([name]).   B12 def - just got shot today. Mild anemia - started iron MWF last visit, due for recheck.  Relevant past medical, surgical, family and social history reviewed and updated as indicated. Interim medical history since our last visit reviewed. Allergies and medications reviewed and updated. Current Outpatient Prescriptions on File Prior to Visit  Medication Sig  . Calcium Carb-Cholecalciferol (CALCIUM + D3) 600-200 MG-UNIT TABS Take 2 tablets by mouth daily.  . carbidopa-levodopa (SINEMET CR) 50-200 MG tablet Take 1 tablet by mouth at bedtime.  . carbidopa-levodopa (SINEMET IR) 25-100 MG tablet Take 1 tablet by mouth 3 (three) times daily.  . cholecalciferol (VITAMIN D) 1000 UNITS tablet Take 1,000 Units by mouth daily.  . clonazePAM (KLONOPIN) 0.5 MG tablet TAKE 1 TABLET BY MOUTH 3 TIMES A DAY AS NEEDED  . ferrous sulfate 325 (65 FE) MG tablet Take 1 tablet (325 mg total) by mouth every Monday, Wednesday, and Friday.  . gabapentin (NEURONTIN) 100 MG capsule Take 1 capsule (100 mg total) by mouth 2 (two) times daily.  Marland Kitchen latanoprost (XALATAN) 0.005 % ophthalmic solution Place 1 drop into both eyes at bedtime.  Marland Kitchen omeprazole (PRILOSEC) 40 MG capsule TAKE ONE CAPSULE BY MOUTH EVERY DAY  . polyethylene glycol (GLYCOLAX) packet Take 17 g by mouth as needed.    . pramipexole  (MIRAPEX) 0.5 MG tablet Take 1 tablet (0.5 mg total) by mouth 3 (three) times daily.  Marland Kitchen senna (SENOKOT) 8.6 MG tablet Take 1 tablet by mouth daily.    . sertraline (ZOLOFT) 50 MG tablet Take 1 tablet (50 mg total) by mouth daily.  . traMADol (ULTRAM) 50 MG tablet TAKE 1 TABLET 3 TIMES A DAY AS NEEDED   No current facility-administered medications on file prior to visit.    Review of Systems Per HPI unless specifically indicated in ROS section     Objective:    BP 92/58 mmHg  Pulse 68  Temp(Src) 97.8 F (36.6 C) (Oral)  Wt 138 lb 8 oz (62.823 kg)  SpO2 97%  Wt Readings from Last 3 Encounters:  03/07/16 138 lb 8 oz (62.823 kg)  02/23/16 138 lb (62.596 kg)  12/08/15 127 lb (57.607 kg)    Physical Exam  Constitutional: She appears well-developed and well-nourished. No distress.  HENT:  Mouth/Throat: Oropharynx is clear and moist. No oropharyngeal exudate.  Cardiovascular: Normal rate, regular rhythm, normal heart sounds and intact distal pulses.   No murmur heard. Pulmonary/Chest: Effort normal and breath sounds normal. No respiratory distress. She has no wheezes. She has no rales.  Musculoskeletal: She exhibits no edema.  Lymphadenopathy:    She has no cervical adenopathy.  Neurological:  R hand tremor present  Psychiatric: She has a normal mood and affect.  Nursing note and  vitals reviewed.  Results for orders placed or performed in visit on 12/01/15  Lipid panel  Result Value Ref Range   Cholesterol 185 0 - 200 mg/dL   Triglycerides 39.0 0.0 - 149.0 mg/dL   HDL 77.80 >39.00 mg/dL   VLDL 7.8 0.0 - 40.0 mg/dL   LDL Cholesterol 99 0 - 99 mg/dL   Total CHOL/HDL Ratio 2    NonHDL 106.71   Comprehensive metabolic panel  Result Value Ref Range   Sodium 141 135 - 145 mEq/L   Potassium 4.3 3.5 - 5.1 mEq/L   Chloride 104 96 - 112 mEq/L   CO2 30 19 - 32 mEq/L   Glucose, Bld 108 (H) 70 - 99 mg/dL   BUN 14 6 - 23 mg/dL   Creatinine, Ser 0.80 0.40 - 1.20 mg/dL   Total  Bilirubin 0.5 0.2 - 1.2 mg/dL   Alkaline Phosphatase 50 39 - 117 U/L   AST 17 0 - 37 U/L   ALT 3 0 - 35 U/L   Total Protein 6.8 6.0 - 8.3 g/dL   Albumin 4.0 3.5 - 5.2 g/dL   Calcium 10.2 8.4 - 10.5 mg/dL   GFR 75.94 >60.00 mL/min  CBC with Differential/Platelet  Result Value Ref Range   WBC 3.5 (L) 4.0 - 10.5 K/uL   RBC 4.06 3.87 - 5.11 Mil/uL   Hemoglobin 11.0 (L) 12.0 - 15.0 g/dL   HCT 34.1 (L) 36.0 - 46.0 %   MCV 84.0 78.0 - 100.0 fl   MCHC 32.4 30.0 - 36.0 g/dL   RDW 15.1 11.5 - 15.5 %   Platelets 237.0 150.0 - 400.0 K/uL   Neutrophils Relative % 39.5 (L) 43.0 - 77.0 %   Lymphocytes Relative 47.0 (H) 12.0 - 46.0 %   Monocytes Relative 9.4 3.0 - 12.0 %   Eosinophils Relative 3.4 0.0 - 5.0 %   Basophils Relative 0.7 0.0 - 3.0 %   Neutro Abs 1.4 1.4 - 7.7 K/uL   Lymphs Abs 1.6 0.7 - 4.0 K/uL   Monocytes Absolute 0.3 0.1 - 1.0 K/uL   Eosinophils Absolute 0.1 0.0 - 0.7 K/uL   Basophils Absolute 0.0 0.0 - 0.1 K/uL  IBC panel  Result Value Ref Range   Iron 68 42 - 145 ug/dL   Transferrin 337.0 212.0 - 360.0 mg/dL   Saturation Ratios 14.4 (L) 20.0 - 50.0 %  Ferritin  Result Value Ref Range   Ferritin 41.6 10.0 - 291.0 ng/mL      Assessment & Plan:   Problem List Items Addressed This Visit    B12 deficiency    b12 shot today.       Relevant Medications   cyanocobalamin ((VITAMIN B-12)) injection 1,000 mcg   cyanocobalamin ((VITAMIN B-12)) injection 1,000 mcg (Completed)   Anemia    Check labs in 1 month after iron replacement MWF (lab visit only).       Relevant Medications   cyanocobalamin ((VITAMIN B-12)) injection 1,000 mcg   cyanocobalamin ((VITAMIN B-12)) injection 1,000 mcg (Completed)   MDD (major depressive disorder), single episode, moderate ([place]) - Primary    Overall doing better, planning on establishing with counselor with specialty in parkinson's disease.      Family circumstance   Parkinson's disease ([place])    Overall improving. Appreciate  excellent neurology care.      Chronic insomnia    May try melatonin 6mg  nightly. Will need to review sleep hygiene measures next visit  Follow up plan: Return in about 3 months (around 06/06/2016) for follow up visit.  [name], MD

## 2016-03-07 NOTE — Progress Notes (Signed)
Pre visit review using our clinic review tool, if applicable. No additional management support is needed unless otherwise documented below in the visit note. 

## 2016-03-07 NOTE — Assessment & Plan Note (Signed)
Overall doing better, planning on establishing with counselor with specialty in parkinson's disease.

## 2016-03-07 NOTE — Assessment & Plan Note (Signed)
Overall improving. Appreciate excellent neurology care.

## 2016-03-15 ENCOUNTER — Other Ambulatory Visit: Payer: Self-pay | Admitting: Family Medicine

## 2016-03-15 NOTE — Telephone Encounter (Signed)
Ok to refill 

## 2016-03-16 NOTE — Telephone Encounter (Signed)
plz phone in. 

## 2016-03-16 NOTE — Telephone Encounter (Signed)
Rx called in as directed.   

## 2016-04-02 ENCOUNTER — Other Ambulatory Visit: Payer: Self-pay | Admitting: Family Medicine

## 2016-04-02 DIAGNOSIS — Z1159 Encounter for screening for other viral diseases: Secondary | ICD-10-CM

## 2016-04-02 DIAGNOSIS — E538 Deficiency of other specified B group vitamins: Secondary | ICD-10-CM

## 2016-04-02 DIAGNOSIS — D649 Anemia, unspecified: Secondary | ICD-10-CM

## 2016-04-06 ENCOUNTER — Other Ambulatory Visit (INDEPENDENT_AMBULATORY_CARE_PROVIDER_SITE_OTHER): Payer: Medicare Other

## 2016-04-06 ENCOUNTER — Other Ambulatory Visit: Payer: Self-pay | Admitting: Family Medicine

## 2016-04-06 DIAGNOSIS — A938 Other specified arthropod-borne viral fevers: Secondary | ICD-10-CM | POA: Diagnosis not present

## 2016-04-06 DIAGNOSIS — E538 Deficiency of other specified B group vitamins: Secondary | ICD-10-CM

## 2016-04-06 DIAGNOSIS — Z1159 Encounter for screening for other viral diseases: Secondary | ICD-10-CM

## 2016-04-06 DIAGNOSIS — D649 Anemia, unspecified: Secondary | ICD-10-CM | POA: Diagnosis not present

## 2016-04-06 LAB — CBC WITH DIFFERENTIAL/PLATELET
BASOS ABS: 0 10*3/uL (ref 0.0–0.1)
BASOS PCT: 0.8 % (ref 0.0–3.0)
EOS PCT: 3.2 % (ref 0.0–5.0)
Eosinophils Absolute: 0.1 10*3/uL (ref 0.0–0.7)
HCT: 33.8 % — ABNORMAL LOW (ref 36.0–46.0)
HEMOGLOBIN: 11.2 g/dL — AB (ref 12.0–15.0)
LYMPHS PCT: 49.8 % — AB (ref 12.0–46.0)
Lymphs Abs: 1.9 10*3/uL (ref 0.7–4.0)
MCHC: 33.1 g/dL (ref 30.0–36.0)
MCV: 82.1 fl (ref 78.0–100.0)
MONOS PCT: 7.5 % (ref 3.0–12.0)
Monocytes Absolute: 0.3 10*3/uL (ref 0.1–1.0)
Neutro Abs: 1.5 10*3/uL (ref 1.4–7.7)
Neutrophils Relative %: 38.7 % — ABNORMAL LOW (ref 43.0–77.0)
Platelets: 214 10*3/uL (ref 150.0–400.0)
RBC: 4.11 Mil/uL (ref 3.87–5.11)
RDW: 16.3 % — ABNORMAL HIGH (ref 11.5–15.5)
WBC: 3.8 10*3/uL — AB (ref 4.0–10.5)

## 2016-04-06 LAB — IBC PANEL
Iron: 106 ug/dL (ref 42–145)
Saturation Ratios: 20.5 % (ref 20.0–50.0)
Transferrin: 370 mg/dL — ABNORMAL HIGH (ref 212.0–360.0)

## 2016-04-06 LAB — FERRITIN: Ferritin: 38.6 ng/mL (ref 10.0–291.0)

## 2016-04-06 LAB — VITAMIN B12: Vitamin B-12: 594 pg/mL (ref 211–911)

## 2016-04-07 ENCOUNTER — Encounter: Payer: Self-pay | Admitting: *Deleted

## 2016-04-07 LAB — PATHOLOGIST SMEAR REVIEW

## 2016-04-07 LAB — HEPATITIS C ANTIBODY: HCV Ab: NEGATIVE

## 2016-04-09 ENCOUNTER — Other Ambulatory Visit: Payer: Self-pay | Admitting: Family Medicine

## 2016-04-09 DIAGNOSIS — D649 Anemia, unspecified: Secondary | ICD-10-CM

## 2016-04-10 ENCOUNTER — Encounter: Payer: Self-pay | Admitting: *Deleted

## 2016-04-20 ENCOUNTER — Other Ambulatory Visit: Payer: Self-pay | Admitting: Neurology

## 2016-04-20 NOTE — Telephone Encounter (Signed)
Carbidopa Levodopa refill requested. Per last office note- patient to remain on medication. Refill approved and sent to patient's pharmacy.   

## 2016-04-24 ENCOUNTER — Other Ambulatory Visit: Payer: Self-pay | Admitting: Family Medicine

## 2016-04-25 NOTE — Telephone Encounter (Signed)
plz phone in. 

## 2016-04-25 NOTE — Telephone Encounter (Signed)
Received refill electronically Last refill 03/16/16 #50 Last office visit 03/07/16 See allergy/contraindiction

## 2016-04-26 NOTE — Telephone Encounter (Signed)
Rx called in as directed.   

## 2016-04-28 ENCOUNTER — Encounter: Payer: Self-pay | Admitting: Radiology

## 2016-04-28 ENCOUNTER — Ambulatory Visit (INDEPENDENT_AMBULATORY_CARE_PROVIDER_SITE_OTHER)
Admission: RE | Admit: 2016-04-28 | Discharge: 2016-04-28 | Disposition: A | Discharge status: Home / Self Care | Payer: Medicare Other | Source: Ambulatory Visit | Attending: Family Medicine | Admitting: Family Medicine

## 2016-04-28 ENCOUNTER — Ambulatory Visit (INDEPENDENT_AMBULATORY_CARE_PROVIDER_SITE_OTHER): Payer: Medicare Other | Admitting: Family Medicine

## 2016-04-28 ENCOUNTER — Encounter: Payer: Self-pay | Admitting: Family Medicine

## 2016-04-28 VITALS — BP 104/60 | HR 68 | Temp 97.8°F

## 2016-04-28 DIAGNOSIS — M797 Fibromyalgia: Secondary | ICD-10-CM

## 2016-04-28 DIAGNOSIS — E538 Deficiency of other specified B group vitamins: Secondary | ICD-10-CM | POA: Diagnosis not present

## 2016-04-28 DIAGNOSIS — R21 Rash and other nonspecific skin eruption: Secondary | ICD-10-CM

## 2016-04-28 DIAGNOSIS — M25571 Pain in right ankle and joints of right foot: Secondary | ICD-10-CM

## 2016-04-28 DIAGNOSIS — M79671 Pain in right foot: Secondary | ICD-10-CM | POA: Insufficient documentation

## 2016-04-28 DIAGNOSIS — F411 Generalized anxiety disorder: Secondary | ICD-10-CM

## 2016-04-28 HISTORY — DX: Generalized anxiety disorder: F41.1

## 2016-04-28 MED ORDER — TRIAMCINOLONE ACETONIDE 0.5 % EX CREA: TOPICAL_CREAM | CUTANEOUS | Status: DC

## 2016-04-28 MED ORDER — CYANOCOBALAMIN 1000 MCG/ML IJ SOLN
1000.0000 ug | Freq: Once | INTRAMUSCULAR | Status: AC
  Administered 2016-04-28: 1000 ug via INTRAMUSCULAR

## 2016-04-28 NOTE — Assessment & Plan Note (Signed)
?  psoriasis - previously improved with TCI cream - will refill today. Update if not improved. Pt states this rash is different from prior autoimmune rash for which she was on cellcept (off for last 1+ yrs).

## 2016-04-28 NOTE — Addendum Note (Signed)
Addended by: Royann Shivers A on: 04/28/2016 11:17 AM   Modules accepted: Orders

## 2016-04-28 NOTE — Patient Instructions (Addendum)
Update UDS today.  For rash ?psoriasis - treat with triamcinolone cream twice daily as needed. Let us know if not improving with treatment.  For ankle - possible sprain. Xray today and we will place you in ASO brace.  B12 shot today.

## 2016-04-28 NOTE — Assessment & Plan Note (Signed)
On regular klonopin - update UDS today

## 2016-04-28 NOTE — Progress Notes (Signed)
Pre visit review using our clinic review tool, if applicable. No additional management support is needed unless otherwise documented below in the visit note. 

## 2016-04-28 NOTE — Progress Notes (Signed)
BP 104/60 mmHg  Pulse 68  Temp(Src) 97.8 F (36.6 C) (Oral)  Wt    CC: R foot swelling  Subjective:    Patient ID: [name], female    DOB: 11-Aug-1946, 70 y.o.   MRN: [id number]  HPI: [name] is a 70 y.o. female presenting on 04/28/2016 for Foot Swelling   H/o rash bilateral lower legs treated with neosporin 1 month ago - this actually worsened rash. Itchy spots that aren't healing well. Has also tried cortisone which has helped itching. 2 wks ago started noticing pain posterior foot and at ankles. Over last 5 days noticing swelling of R foot. Affecting activities (trouble riding stationary bicycle). Denies recent trauma/falls.   Denies h/o gout.  No personal h/o DVT.  H/o unexplained autoimmune rash controlled with cellcept - off this for last few years.   Relevant past medical, surgical, family and social history reviewed and updated as indicated. Interim medical history since our last visit reviewed. Allergies and medications reviewed and updated. Current Outpatient Prescriptions on File Prior to Visit  Medication Sig  . Calcium Carb-Cholecalciferol (CALCIUM + D3) 600-200 MG-UNIT TABS Take 2 tablets by mouth daily.  . carbidopa-levodopa (SINEMET CR) 50-200 MG tablet Take 1 tablet by mouth at bedtime.  . carbidopa-levodopa (SINEMET IR) 25-100 MG tablet TAKE 1 TABLET BY MOUTH 3 (THREE) TIMES DAILY.  . cholecalciferol (VITAMIN D) 1000 UNITS tablet Take 1,000 Units by mouth daily.  . clonazePAM (KLONOPIN) 0.5 MG tablet TAKE 1 TABLET BY MOUTH 3 TIMES A DAY AS NEEDED  . cyanocobalamin (,VITAMIN B-12,) 1000 MCG/ML injection Inject 1 mL (1,000 mcg total) into the muscle every 30 (thirty) days.  . ferrous sulfate 325 (65 FE) MG tablet Take 1 tablet (325 mg total) by mouth every Monday, Wednesday, and Friday.  . gabapentin (NEURONTIN) 100 MG capsule Take 1 capsule (100 mg total) by mouth 2 (two) times daily.  Marland Kitchen latanoprost (XALATAN) 0.005 % ophthalmic solution Place 1 drop into  both eyes at bedtime.  . Melatonin 3 MG CAPS Take 3 mg by mouth at bedtime.  Marland Kitchen omeprazole (PRILOSEC) 40 MG capsule TAKE ONE CAPSULE BY MOUTH EVERY DAY  . polyethylene glycol (GLYCOLAX) packet Take 17 g by mouth as needed.    . pramipexole (MIRAPEX) 0.5 MG tablet Take 1 tablet (0.5 mg total) by mouth 3 (three) times daily.  Marland Kitchen senna (SENOKOT) 8.6 MG tablet Take 1 tablet by mouth daily.    . sertraline (ZOLOFT) 50 MG tablet Take 1 tablet (50 mg total) by mouth daily.  . traMADol (ULTRAM) 50 MG tablet TAKE ONE TABLET BY MOUTH 3 TIMES A DAY   No current facility-administered medications on file prior to visit.    Review of Systems Per HPI unless specifically indicated in ROS section     Objective:    BP 104/60 mmHg  Pulse 68  Temp(Src) 97.8 F (36.6 C) (Oral)  Wt   Wt Readings from Last 3 Encounters:  03/07/16 138 lb 8 oz (62.823 kg)  02/23/16 138 lb (62.596 kg)  12/08/15 127 lb (57.607 kg)    Physical Exam  Constitutional: She appears well-developed and well-nourished. No distress.  Musculoskeletal: She exhibits edema.  Diffuse baseline tenderness to palpation R lateral ankle edema present predominant pain to palpation at ATFL No significant malleolar discomfort to palpation No significant pain at achilles or with calcaneal squeeze 2+ DP bilaterally  Skin: Skin is warm and dry. Rash noted. There is erythema.  Nodular excoriated pruritic  erythematous rash bilateral lower extremities as well as resolving rash upper extremities  Psychiatric: She has a normal mood and affect.  Nursing note and vitals reviewed.  Results for orders placed or performed in visit on 04/06/16  Vitamin B12  Result Value Ref Range   Vitamin B-12 594 211 - 911 pg/mL  Ferritin  Result Value Ref Range   Ferritin 38.6 10.0 - 291.0 ng/mL  IBC panel  Result Value Ref Range   Iron 106 42 - 145 ug/dL   Transferrin 370.0 (H) 212.0 - 360.0 mg/dL   Saturation Ratios 20.5 20.0 - 50.0 %  CBC with  Differential/Platelet  Result Value Ref Range   WBC 3.8 (L) 4.0 - 10.5 K/uL   RBC 4.11 3.87 - 5.11 Mil/uL   Hemoglobin 11.2 (L) 12.0 - 15.0 g/dL   HCT 33.8 (L) 36.0 - 46.0 %   MCV 82.1 78.0 - 100.0 fl   MCHC 33.1 30.0 - 36.0 g/dL   RDW 16.3 (H) 11.5 - 15.5 %   Platelets 214.0 150.0 - 400.0 K/uL   Neutrophils Relative % 38.7 (L) 43.0 - 77.0 %   Lymphocytes Relative 49.8 (H) 12.0 - 46.0 %   Monocytes Relative 7.5 3.0 - 12.0 %   Eosinophils Relative 3.2 0.0 - 5.0 %   Basophils Relative 0.8 0.0 - 3.0 %   Neutro Abs 1.5 1.4 - 7.7 K/uL   Lymphs Abs 1.9 0.7 - 4.0 K/uL   Monocytes Absolute 0.3 0.1 - 1.0 K/uL   Eosinophils Absolute 0.1 0.0 - 0.7 K/uL   Basophils Absolute 0.0 0.0 - 0.1 K/uL  Pathologist smear review  Result Value Ref Range   Path Review SEE NOTE       Assessment & Plan:   Problem List Items Addressed This Visit    B12 deficiency    Update B12 shot today. rec continued monthly shots. Lab Results  Component Value Date   [id number] 04/06/2016         Fibromyalgia   Pain in joint, ankle and foot - Primary    Anticipate right lateral ankle sprain given exam although patient does not remember inciting injury. Check xray to r/o bone etiology.  Place in ASO brace. Update if not improving with rest.      Relevant Orders   DG Ankle Complete Right   Skin rash    ?psoriasis - previously improved with TCI cream - will refill today. Update if not improved. Pt states this rash is different from prior autoimmune rash for which she was on cellcept (off for last 1+ yrs).       GAD (generalized anxiety disorder)    On regular klonopin - update UDS today          Follow up plan: Return if symptoms worsen or fail to improve, for follow up visit.  [name], MD

## 2016-04-28 NOTE — Assessment & Plan Note (Signed)
Anticipate right lateral ankle sprain given exam although patient does not remember inciting injury. Check xray to r/o bone etiology.  Place in ASO brace. Update if not improving with rest.

## 2016-04-28 NOTE — Assessment & Plan Note (Addendum)
Update B12 shot today. rec continued monthly shots. Lab Results  Component Value Date   [id number] 04/06/2016

## 2016-05-05 ENCOUNTER — Other Ambulatory Visit: Payer: Self-pay | Admitting: Neurology

## 2016-05-05 NOTE — Telephone Encounter (Signed)
Mirapex refill requested. Per last office note- patient to remain on medication. Refill approved and sent to patient's pharmacy.   

## 2016-05-10 ENCOUNTER — Encounter: Payer: Self-pay | Admitting: Family Medicine

## 2016-05-26 ENCOUNTER — Ambulatory Visit: Payer: Medicare Other | Admitting: Neurology

## 2016-06-02 ENCOUNTER — Ambulatory Visit: Payer: Medicare Other | Admitting: Neurology

## 2016-06-08 ENCOUNTER — Ambulatory Visit: Payer: Medicare Other | Admitting: Family Medicine

## 2016-06-10 ENCOUNTER — Telehealth: Payer: Self-pay | Admitting: Family Medicine

## 2016-06-10 NOTE — Telephone Encounter (Signed)
LM for pt to sch AWV, mn ° °

## 2016-06-20 ENCOUNTER — Other Ambulatory Visit: Payer: Self-pay | Admitting: *Deleted

## 2016-06-20 MED ORDER — CLONAZEPAM 0.5 MG PO TABS: 0.5000 mg | ORAL_TABLET | Freq: Three times a day (TID) | ORAL | 1 refills | Status: DC | PRN

## 2016-06-20 MED ORDER — CLONAZEPAM 0.5 MG PO TABS: 0.5000 mg | ORAL_TABLET | Freq: Two times a day (BID) | ORAL | 1 refills | Status: DC | PRN

## 2016-06-20 NOTE — Addendum Note (Signed)
Addended by: [name] on: 06/20/2016 11:07 PM   Modules accepted: Orders

## 2016-06-20 NOTE — Telephone Encounter (Signed)
Ok to refill 

## 2016-06-20 NOTE — Telephone Encounter (Addendum)
plz phone in bid dosing.

## 2016-06-21 NOTE — Telephone Encounter (Signed)
Rx called in as directed.   

## 2016-06-23 ENCOUNTER — Other Ambulatory Visit: Payer: Self-pay | Admitting: Family Medicine

## 2016-06-23 NOTE — Telephone Encounter (Signed)
Ok to refill 

## 2016-06-25 NOTE — Telephone Encounter (Signed)
plz phone in. 

## 2016-06-26 ENCOUNTER — Other Ambulatory Visit: Payer: Self-pay | Admitting: Family Medicine

## 2016-06-26 NOTE — Telephone Encounter (Signed)
Rx called in as directed.   
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
## 2016-07-06 ENCOUNTER — Encounter: Payer: Self-pay | Admitting: Neurology

## 2016-07-06 ENCOUNTER — Ambulatory Visit (INDEPENDENT_AMBULATORY_CARE_PROVIDER_SITE_OTHER): Payer: Medicare Other | Admitting: Neurology

## 2016-07-06 VITALS — BP 90/52 | HR 68 | Ht 65.5 in | Wt 145.0 lb

## 2016-07-06 DIAGNOSIS — M79671 Pain in right foot: Secondary | ICD-10-CM | POA: Diagnosis not present

## 2016-07-06 DIAGNOSIS — G20 Parkinson's disease: Secondary | ICD-10-CM | POA: Diagnosis not present

## 2016-07-06 NOTE — Patient Instructions (Signed)
1.  Drink 64 oz of water per day 2.  Let me know if you want a referral to Dr. Tamala Julian (sports med) for your foot

## 2016-07-06 NOTE — Progress Notes (Signed)
Psychosocial/Social Work Assessment     [facility], [facility]  Patient: [name] MRN: [id number] Date of Visit: 07/06/2016 Reason for Consult: Psychosocial support   DEMOGRAPHIC INFORMATION Race/Ethnicity: Caucasian  Age: 70 yo Relationship Status: Widowed    Living Situation: Permanent residence in [place], [place], but currently living with pt son in [place], [place] and Mental Status:  Alert and Oriented to Person, Place,Time, and Situation  Comments: Pt pleasant and actively engaged in visit.  AMBULATION   No balance issues:  AMBULATION ASSISTANCE  Walks independently:  Activities of Daily Living (ADLs)   Independent in activities of daily living?  Yes      Driving  Currently driving? Yes  EXERCISE AND ACTIVITY ENGAGEMENT Information about Current Exercise   Types of exercise: pt reports that she walks for exercise   Current or History of Medications for Mood (Yes (Y) or No (N)? Yes,  Per chart pt takes Zoloft for depression   Use of Mental Health Services: Per chart, referred last visit to therapist, Cristin Saffo, but did not pursue due to pt son getting sick and pt having to assist pt son.   Coping Method Talks with sister  Attends support group?   No  Interested in information on local support groups? Yes, pt interested in information about support groups  Family live nearby? Yes, pt currently lives with son and pt has daughter and grandchildren that live in [place]. Use in-home care? N/A Support System: Fair Other/Comments: Pt reports that she speaks mostly to her sister for support, but expressed wanting to go to support group to meet other individuals with PD.     Social Worker assessment / plan:     CSW received referral from Dr. Carles Collet to meet with pt during follow up visit today to introduce self and provide psychosocial support. CSW met with pt in exam room. CSW introduced self and explained role. Pt alert  and oriented x 4, pleasant and actively engaged in conversation. CSW provided supportive listening as pt discussed her recent move to [place] from [place] to live with her son following her son's stroke in July. Pt reports that he has been progressing well following the stroke, but states that pt son may have cancer, so they are awaiting further testing and answers surrounding potential diagnosis of cancer. Pt shared that pt son had "blood clots" which was a concern to her as her husband passed away three years ago from blood clots. Pt seems satisfied with living with pt son at this time and expressed that pt son wants her to remain staying with him during his recovery and until he has more answers surrounding possible cancer diagnosis. Pt states that she is comfortable with staying with him for as long as he wishes.  CSW explored with pt her support system and pt discussed that her kids are a support for her, but mainly she relies on her sister to talk to and process things she may be struggling with. Pt discussed that her sister is her best friend, but pt expressed that she was hopeful to meet another individual with Parkinson's Disease to have as a support. Pt expressed feeling alone surrounding PD diagnosis.  CSW discussed benefits of support group and provided pt with information about Parkinson's Disease Support Groups in [place]. Pt states that she wants to start attending support group in [place] since she is currently living in [place], but wants to make sure when she starts that  she can continue to come monthly. CSW encouraged pt to attend the Parkinson's Support Group in [place] in August. CSW provided pt with CSW contact information and encouraged pt to not hesitate to call if any questions or need for resource arise before pt next visit. Pt expressed appreciation for visit and having CSW support through the movement disorders clinic. CSW to remain available to provide  psychosocial support to pt.  Education Materials Provided: [employer] PD Support Group Flyer  Social Work Plan:  1. CSW provided pt [employer] PD Support Group Flyer and encouraged pt to attend. 2. CSW to continue to assess psychosocial needs and coping with PD and son's medical situation during follow up visits. 4. Pt has been informed of social work services (including connection to [employer] and availability of short-term coping/problem-solving counseling in the future) and has agreed to contact me if I can be of further assistance with any other social work services.  5. The information contained in this note has been reported to, and discussed with, Dr. Carles Collet.  Alison Murray, MSW, LCSW Clinical Social Worker [facility] [phone]

## 2016-07-18 ENCOUNTER — Ambulatory Visit (INDEPENDENT_AMBULATORY_CARE_PROVIDER_SITE_OTHER): Payer: Medicare Other | Admitting: Family Medicine

## 2016-07-18 DIAGNOSIS — E538 Deficiency of other specified B group vitamins: Secondary | ICD-10-CM

## 2016-07-18 MED ORDER — CYANOCOBALAMIN 1000 MCG/ML IJ SOLN
1000.0000 ug | Freq: Once | INTRAMUSCULAR | Status: AC
  Administered 2016-07-18: 1000 ug via INTRAMUSCULAR

## 2016-07-18 NOTE — Progress Notes (Signed)
b12 shot today. 

## 2016-07-20 ENCOUNTER — Telehealth: Payer: Self-pay | Admitting: Family Medicine

## 2016-07-20 NOTE — Telephone Encounter (Signed)
Called pt - she needs to scheduled AWV before 09/30 Left message

## 2016-07-25 ENCOUNTER — Ambulatory Visit: Payer: Medicare Other

## 2016-08-03 ENCOUNTER — Ambulatory Visit (INDEPENDENT_AMBULATORY_CARE_PROVIDER_SITE_OTHER): Payer: Medicare Other

## 2016-08-03 VITALS — BP 96/64 | HR 66 | Temp 98.6°F | Ht 64.75 in | Wt 141.0 lb

## 2016-08-03 DIAGNOSIS — Z0000 Encounter for general adult medical examination without abnormal findings: Secondary | ICD-10-CM | POA: Diagnosis not present

## 2016-08-03 NOTE — Patient Instructions (Signed)
[name] , Thank you for taking time to come for your Medicare Wellness Visit. I appreciate your ongoing commitment to your health goals. Please review the following plan we discussed and let me know if I can assist you in the future.   These are the goals we discussed: Goals    . Increase physical activity          Starting 08/03/16, I will continue to walk at least 30 min daily.        This is a list of the screening recommended for you and due dates:  Health Maintenance  Topic Date Due  . Flu Shot  11/26/2016*  . Mammogram  08/05/2016  . Colon Cancer Screening  06/30/2018  . DTaP/Tdap/Td vaccine (2 - Td) 08/17/2021  . Tetanus Vaccine  08/17/2021  . DEXA scan (bone density measurement)  Completed  . Shingles Vaccine  Completed  .  Hepatitis C: One time screening is recommended by Center for Disease Control  (CDC) for  adults born from [birth year] through 1963.   Completed  . Pneumonia vaccines  Completed  *Topic was postponed. The date shown is not the original due date.   Preventive Care for Adults  A healthy lifestyle and preventive care can promote health and wellness. Preventive health guidelines for adults include the following key practices.  . A routine yearly physical is a good way to check with your health care provider about your health and preventive screening. It is a chance to share any concerns and updates on your health and to receive a thorough exam.  . Visit your dentist for a routine exam and preventive care every 6 months. Brush your teeth twice a day and floss once a day. Good oral hygiene prevents tooth decay and gum disease.  . The frequency of eye exams is based on your age, health, family medical history, use  of contact lenses, and other factors. Follow your health care provider's ecommendations for frequency of eye exams.  . Eat a healthy diet. Foods like vegetables, fruits, whole grains, low-fat dairy products, and lean protein foods contain the nutrients you  need without too many calories. Decrease your intake of foods high in solid fats, added sugars, and salt. Eat the right amount of calories for you. Get information about a proper diet from your health care provider, if necessary.  . Regular physical exercise is one of the most important things you can do for your health. Most adults should get at least 150 minutes of moderate-intensity exercise (any activity that increases your heart rate and causes you to sweat) each week. In addition, most adults need muscle-strengthening exercises on 2 or more days a week.  Silver Sneakers may be a benefit available to you. To determine eligibility, you may visit the website: www.silversneakers.com or contact program at 510 021 8194 Mon-Fri between 8AM-8PM.   . Maintain a healthy weight. The body mass index (BMI) is a screening tool to identify possible weight problems. It provides an estimate of body fat based on height and weight. Your health care provider can find your BMI and can help you achieve or maintain a healthy weight.   For adults 20 years and older: ? A BMI below 18.5 is considered underweight. ? A BMI of 18.5 to 24.9 is normal. ? A BMI of 25 to 29.9 is considered overweight. ? A BMI of 30 and above is considered obese.   . Maintain normal blood lipids and cholesterol levels by exercising and minimizing your intake  of saturated fat. Eat a balanced diet with plenty of fruit and vegetables. Blood tests for lipids and cholesterol should begin at age 68 and be repeated every 5 years. If your lipid or cholesterol levels are high, you are over 50, or you are at high risk for heart disease, you may need your cholesterol levels checked more frequently. Ongoing high lipid and cholesterol levels should be treated with medicines if diet and exercise are not working.  . If you smoke, find out from your health care provider how to quit. If you do not use tobacco, please do not start.  . If you choose to drink  alcohol, please do not consume more than 2 drinks per day. One drink is considered to be 12 ounces (355 mL) of beer, 5 ounces (148 mL) of wine, or 1.5 ounces (44 mL) of liquor.  . If you are 37-16 years old, ask your health care provider if you should take aspirin to prevent strokes.  . Use sunscreen. Apply sunscreen liberally and repeatedly throughout the day. You should seek shade when your shadow is shorter than you. Protect yourself by wearing long sleeves, pants, a wide-brimmed hat, and sunglasses year round, whenever you are outdoors.  . Once a month, do a whole body skin exam, using a mirror to look at the skin on your back. Tell your health care provider of new moles, moles that have irregular borders, moles that are larger than a pencil eraser, or moles that have changed in shape or color.

## 2016-08-03 NOTE — Progress Notes (Signed)
I reviewed health advisor's note, was available for consultation, and agree with documentation and plan.  

## 2016-08-03 NOTE — Progress Notes (Signed)
Pre visit review using our clinic review tool, if applicable. No additional management support is needed unless otherwise documented below in the visit note. 

## 2016-08-03 NOTE — Progress Notes (Signed)
Subjective:   [name] is a 70 y.o. female who presents for Medicare Annual (Subsequent) preventive examination.  Review of Systems:  N/A Cardiac Risk Factors include: advanced age (>34men, >59 women)     Objective:     Vitals: BP 96/64 (BP Location: Left Arm, Patient Position: Sitting, Cuff Size: Normal)   Pulse 66   Temp 98.6 F (37 C) (Oral)   Ht 5' 4.75" (1.645 m) Comment: no shoes  Wt 141 lb (64 kg)   SpO2 97%   BMI 23.65 kg/m   Body mass index is 23.65 kg/m.   Tobacco History  Smoking Status  . Never Smoker  Smokeless Tobacco  . Never Used     Counseling given: No   Past Medical History:  Diagnosis Date  . ANEMIA-NOS 09/25/2007  . B12 DEFICIENCY 05/03/2007  . CAD (coronary artery disease) 01/2010   MI, Nishan  . Cardiomyopathy ([place]) 01/2010  . FIBROMYALGIA 05/03/2007  . GERD 02/22/2010  . Glaucoma 02/2013    eye center  . History of CHF (congestive heart failure) 01/2010  . History of colon polyps 2004  . HYPERLIPIDEMIA 12/19/2007  . HYPOTENSION, ORTHOSTATIC 12/06/2008  . Interstitial cystitis    [place] now Dr Amalia Hailey  . Lupus (systemic lupus erythematosus) ([place]) 02/08/2010  . MCTD (mixed connective tissue disease) ([place]) 02/08/2010  . OSTEOPOROSIS 08/2009   bisphosphonate on hold 2/2 dysphagia, on reclast done in August each year  . Parkinson's disease ([place]) 08/25/2015   Dx Dr Carles Collet 07/2015   . 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 Takotsubo cardiomyopathy 2008   due to E coli urosepsis   Past Surgical History:  Procedure Laterality Date  . ABDOMINAL HYSTERECTOMY  1970s   IUD infection - first partial then with oophorectomy (cysts), complication - low blood pressure  . CHOLECYSTECTOMY     complication - low blood pressure  . COLONOSCOPY  06/2008   h/o polyps but latest WNL, rec rpt 10 yrs Olevia Perches)  . CYSTOSCOPY  12/2013   abx treatment for recurrent cystitis  . DEXA  04/2013   T -2.9 @ femur, -1.6 @ spine   Family  History  Problem Relation Age of Onset  . Heart attack Father   . Diabetes Father   . Prostate cancer Father   . Cancer Mother     esophageal  . Ovarian cancer Sister   . Uterine cancer Sister   . Breast cancer Sister   . Colitis Sister   . Lung cancer Brother   . CAD Brother    History  Sexual Activity  . Sexual activity: No    Outpatient Encounter Prescriptions as of 08/03/2016  Medication Sig  . Calcium Carb-Cholecalciferol (CALCIUM + D3) 600-200 MG-UNIT TABS Take 2 tablets by mouth daily.  . carbidopa-levodopa (SINEMET CR) 50-200 MG tablet Take 1 tablet by mouth at bedtime.  . carbidopa-levodopa (SINEMET IR) 25-100 MG tablet TAKE 1 TABLET BY MOUTH 3 (THREE) TIMES DAILY.  . cholecalciferol (VITAMIN D) 1000 UNITS tablet Take 1,000 Units by mouth daily.  . clonazePAM (KLONOPIN) 0.5 MG tablet Take 1 tablet (0.5 mg total) by mouth 2 (two) times daily as needed.  . cyanocobalamin (,VITAMIN B-12,) 1000 MCG/ML injection Inject 1 mL (1,000 mcg total) into the muscle every 30 (thirty) days.  . ferrous sulfate 325 (65 FE) MG tablet Take 1 tablet (325 mg total) by mouth every Monday, Wednesday, and Friday.  . gabapentin (NEURONTIN) 100 MG capsule Take  100 mg by mouth at bedtime.   Marland Kitchen latanoprost (XALATAN) 0.005 % ophthalmic solution Place 1 drop into both eyes at bedtime.  . Melatonin 3 MG CAPS Take 3 mg by mouth at bedtime.  Marland Kitchen omeprazole (PRILOSEC) 40 MG capsule TAKE ONE CAPSULE BY MOUTH EVERY DAY  . polyethylene glycol (GLYCOLAX) packet Take 17 g by mouth as needed.    . pramipexole (MIRAPEX) 0.5 MG tablet TAKE 1 TABLET (0.5 MG TOTAL) BY MOUTH 3 (THREE) TIMES DAILY.  Marland Kitchen senna (SENOKOT) 8.6 MG tablet Take 1 tablet by mouth daily.    . sertraline (ZOLOFT) 50 MG tablet Take 1 tablet (50 mg total) by mouth daily.  . traMADol (ULTRAM) 50 MG tablet TAKE 1 TABLET 3 TIMES A DAY AS NEEDED  . triamcinolone cream (KENALOG) 0.5 % Apply to affected area twice daily for no more than 2 weeks at a time    No facility-administered encounter medications on file as of 08/03/2016.     Activities of Daily Living In your present state of health, do you have any difficulty performing the following activities: 08/03/2016  Hearing? N  Vision? N  Difficulty concentrating or making decisions? N  Walking or climbing stairs? N  Dressing or bathing? N  Doing errands, shopping? N  Preparing Food and eating ? N  Using the Toilet? N  In the past six months, have you accidently leaked urine? N  Do you have problems with loss of bowel control? N  Managing your Medications? N  Managing your Finances? N  Housekeeping or managing your Housekeeping? N  Some recent data might be hidden    Patient Care Team: [name], MD as PCP - General (Family Medicine) Domingo Pulse, MD (Urology) Ludwig Clarks, DO as Consulting Physician (Neurology) Ronnell Freshwater, MD as Referring Physician (Ophthalmology)    Assessment:     Hearing Screening   125Hz  250Hz  500Hz  1000Hz  2000Hz  3000Hz  4000Hz  6000Hz  8000Hz   Right ear:   40 40 40  40    Left ear:   40 40 40  40    Vision Screening Comments: Last vision exam on 04/18/16 with Dr. Cephus Shelling   Exercise Activities and Dietary recommendations Current Exercise Habits: Home exercise routine, Time (Minutes): 30, Frequency (Times/Week): 7, Weekly Exercise (Minutes/Week): 210, Intensity: Mild, Exercise limited by: None identified  Goals    . Increase physical activity          Starting 08/03/16, I will continue to walk at least 30 min daily.       Fall Risk Fall Risk  08/03/2016 07/06/2016 05/26/2015 05/21/2014 05/20/2013  Falls in the past year? No No No No No   Depression Screen PHQ 2/9 Scores 08/03/2016 05/26/2015 05/21/2014 05/20/2013  PHQ - 2 Score 0 0 0 0     Cognitive Testing MMSE - Mini Mental State Exam 08/03/2016  Orientation to time 5  Orientation to Place 5  Registration 3  Attention/ Calculation 0  Recall 3  Language- name 2 objects 0  Language-  repeat 1  Language- follow 3 step command 3  Language- read & follow direction 0  Write a sentence 0  Copy design 0  Total score 20   PLEASE NOTE: A Mini-Cog screen was completed. Maximum score is 20. A value of 0 denotes this part of [place] MMSE was not completed or the patient failed this part of the Mini-Cog screening.   Mini-Cog Screening Orientation to Time - Max 5 pts Orientation to Place - Max  5 pts Registration - Max 3 pts Recall - Max 3 pts Language Repeat - Max 1 pts Language Follow 3 Step Command - Max 3 pts   Immunization History  Administered Date(s) Administered  . Influenza Split 08/18/2011, 10/11/2012  . Influenza Whole 09/25/2007, 09/08/2008, 08/23/2009, 09/02/2010  . Influenza,inj,Quad PF,36+ Mos 09/02/2013, 08/13/2014, 10/28/2015  . Pneumococcal Conjugate-13 05/21/2014  . Pneumococcal Polysaccharide-23 05/26/2015  . Tdap 08/18/2011  . Zoster 10/12/2010   Screening Tests Health Maintenance  Topic Date Due  . INFLUENZA VACCINE  11/26/2016 (Originally 06/27/2016)  . MAMMOGRAM  08/05/2016  . COLONOSCOPY  06/30/2018  . DTaP/Tdap/Td (2 - Td) 08/17/2021  . TETANUS/TDAP  08/17/2021  . DEXA SCAN  Completed  . ZOSTAVAX  Completed  . Hepatitis C Screening  Completed  . PNA vac Low Risk Adult  Completed      Plan:     I have personally reviewed and addressed the Medicare Annual Wellness questionnaire and have noted the following in the patient's chart:  A. Medical and social history B. Use of alcohol, tobacco or illicit drugs  C. Current medications and supplements D. Functional ability and status E.  Nutritional status F.  Physical activity G. Advance directives H. List of other physicians I.  Hospitalizations, surgeries, and ER visits in previous 12 months J.  [place] to include hearing, vision, cognitive, depression L. Referrals and appointments - none  In addition, I have reviewed and discussed with patient certain preventive  protocols, quality metrics, and best practice recommendations. A written personalized care plan for preventive services as well as general preventive health recommendations were provided to patient.  See attached scanned questionnaire for additional information.   Signed,   Lindell Noe, MHA, BS, LPN Health Advisor

## 2016-08-03 NOTE — Progress Notes (Signed)
PCP notes:   Health maintenance:  Flu vaccine - addressed  Abnormal screenings:   None  Patient concerns:   None  Nurse concerns:  None  Next PCP appt:   08/11/16 @ 1430

## 2016-08-07 ENCOUNTER — Other Ambulatory Visit: Payer: Self-pay | Admitting: Family Medicine

## 2016-08-07 NOTE — Telephone Encounter (Signed)
plz phone in. 

## 2016-08-07 NOTE — Telephone Encounter (Signed)
Ok to refill? Last filled 06/25/16 #50 0RF

## 2016-08-08 NOTE — Telephone Encounter (Signed)
Rx called in as directed.   

## 2016-08-11 ENCOUNTER — Ambulatory Visit (INDEPENDENT_AMBULATORY_CARE_PROVIDER_SITE_OTHER): Payer: Medicare Other | Admitting: Family Medicine

## 2016-08-11 ENCOUNTER — Encounter: Payer: Self-pay | Admitting: Family Medicine

## 2016-08-11 VITALS — BP 100/60 | HR 70 | Temp 98.0°F | Wt 143.0 lb

## 2016-08-11 DIAGNOSIS — F321 Major depressive disorder, single episode, moderate: Secondary | ICD-10-CM

## 2016-08-11 DIAGNOSIS — E538 Deficiency of other specified B group vitamins: Secondary | ICD-10-CM

## 2016-08-11 DIAGNOSIS — M79671 Pain in right foot: Secondary | ICD-10-CM | POA: Diagnosis not present

## 2016-08-11 DIAGNOSIS — G20 Parkinson's disease: Secondary | ICD-10-CM | POA: Diagnosis not present

## 2016-08-11 NOTE — Progress Notes (Signed)
BP 100/60 (BP Location: Left Arm, Patient Position: Sitting, Cuff Size: Normal)   Pulse 70   Temp 98 F (36.7 C) (Oral)   Wt 143 lb (64.9 kg)   SpO2 95%   BMI 23.98 kg/m    CC: 29mo f/u visit Subjective:    Patient ID: [name], female    DOB: Apr 14, 1946, 70 y.o.   MRN: [id number]  HPI: [name] is a 70 y.o. female presenting on 08/11/2016 for Follow-up (3 month follow-up)   Late for 3 mo f/u - due to son's health issues s/p multiple complicated hospitalizations. Son now back at home, she helps care for him.  PD - stable, followed by Dr [name]. Compliant with current regimen.  Depression - on sertraline 50mg  daily.   Ongoing R lateral ankle pain. ASO brace helped but caused some leg swelling.   Relevant past medical, surgical, family and social history reviewed and updated as indicated. Interim medical history since our last visit reviewed. Allergies and medications reviewed and updated. Current Outpatient Prescriptions on File Prior to Visit  Medication Sig  . Calcium Carb-Cholecalciferol (CALCIUM + D3) 600-200 MG-UNIT TABS Take 2 tablets by mouth daily.  . carbidopa-levodopa (SINEMET CR) 50-200 MG tablet Take 1 tablet by mouth at bedtime.  . carbidopa-levodopa (SINEMET IR) 25-100 MG tablet TAKE 1 TABLET BY MOUTH 3 (THREE) TIMES DAILY.  . cholecalciferol (VITAMIN D) 1000 UNITS tablet Take 1,000 Units by mouth daily.  . clonazePAM (KLONOPIN) 0.5 MG tablet Take 1 tablet (0.5 mg total) by mouth 2 (two) times daily as needed.  . cyanocobalamin (,VITAMIN B-12,) 1000 MCG/ML injection Inject 1 mL (1,000 mcg total) into the muscle every 30 (thirty) days.  . ferrous sulfate 325 (65 FE) MG tablet Take 1 tablet (325 mg total) by mouth every Monday, Wednesday, and Friday.  . gabapentin (NEURONTIN) 100 MG capsule Take 100 mg by mouth at bedtime.   Marland Kitchen. latanoprost (XALATAN) 0.005 % ophthalmic solution Place 1 drop into both eyes at bedtime.  . Melatonin 3 MG CAPS Take 3 mg by mouth at  bedtime.  Marland Kitchen. omeprazole (PRILOSEC) 40 MG capsule TAKE ONE CAPSULE BY MOUTH EVERY DAY  . polyethylene glycol (GLYCOLAX) packet Take 17 g by mouth as needed.    . pramipexole (MIRAPEX) 0.5 MG tablet TAKE 1 TABLET (0.5 MG TOTAL) BY MOUTH 3 (THREE) TIMES DAILY.  Marland Kitchen. senna (SENOKOT) 8.6 MG tablet Take 1 tablet by mouth daily.    . sertraline (ZOLOFT) 50 MG tablet Take 1 tablet (50 mg total) by mouth daily.  . traMADol (ULTRAM) 50 MG tablet TAKE 1 TABLET 3 TIMES A DAY AS NEEDED  . triamcinolone cream (KENALOG) 0.5 % Apply to affected area twice daily for no more than 2 weeks at a time   No current facility-administered medications on file prior to visit.     Review of Systems Per HPI unless specifically indicated in ROS section     Objective:    BP 100/60 (BP Location: Left Arm, Patient Position: Sitting, Cuff Size: Normal)   Pulse 70   Temp 98 F (36.7 C) (Oral)   Wt 143 lb (64.9 kg)   SpO2 95%   BMI 23.98 kg/m   Wt Readings from Last 3 Encounters:  08/11/16 143 lb (64.9 kg)  08/03/16 141 lb (64 kg)  07/06/16 145 lb (65.8 kg)    Physical Exam  Constitutional: She appears well-developed and well-nourished. No distress.  Cardiovascular: Normal rate, regular rhythm, normal heart sounds and  intact distal pulses.   No murmur heard. Pulmonary/Chest: Effort normal and breath sounds normal. No respiratory distress. She has no wheezes. She has no rales.  Musculoskeletal: She exhibits no edema.  2+ DP bilaterally Tender diffusely throughout medial right foot without loss of longitudinal arch No pain at lateral ankle or ligament laxity  Skin: Skin is warm and dry. No rash noted.  Psychiatric: She has a normal mood and affect.  Nursing note and vitals reviewed.  Results for orders placed or performed in visit on 04/06/16  Hepatitis C antibody  Result Value Ref Range   HCV Ab NEGATIVE NEGATIVE      Assessment & Plan:   Problem List Items Addressed This Visit    B12 deficiency     Continue monthly B12 shots. Latest level normal.       MDD (major depressive disorder), single episode, moderate (HCC)    Stable period.       Parkinson's disease [facility])    [place] neurology care. Considering going to support group.      Right foot pain - Primary    No lateral ankle ligament sprain today.  Rec restart topical anti inflammatory she has at home.  Update if no improvement noted. Pt agrees.        Other Visit Diagnoses   None.      Follow up plan: Return in about 3 months (around 11/10/2016), or as needed, for annual exam, prior fasting for blood work.  [name], MD

## 2016-08-11 NOTE — Assessment & Plan Note (Signed)
Continue monthly B12 shots. Latest level normal.

## 2016-08-11 NOTE — Assessment & Plan Note (Addendum)
Appreciate neurology care. Considering going to support group.

## 2016-08-11 NOTE — Assessment & Plan Note (Signed)
Stable period.  

## 2016-08-11 NOTE — Assessment & Plan Note (Signed)
No lateral ankle ligament sprain today.  Rec restart topical anti inflammatory she has at home.  Update if no improvement noted. Pt agrees.

## 2016-08-11 NOTE — Patient Instructions (Addendum)
Get flu shot with B12 shot next week. Try anti inflammatory cream to foot. Let us know if no better for referral to sports medicine.  You are doing well today, return as needed or in 3 months for physical.

## 2016-08-11 NOTE — Assessment & Plan Note (Deleted)
No lateral ankle ligament sprain today.  Rec restart topical anti inflammatory she has at home.  Update if no improvement noted. Pt agrees.

## 2016-08-21 ENCOUNTER — Other Ambulatory Visit: Payer: Self-pay | Admitting: Neurology

## 2016-08-22 ENCOUNTER — Other Ambulatory Visit: Payer: Self-pay | Admitting: Family Medicine

## 2016-08-23 ENCOUNTER — Ambulatory Visit (INDEPENDENT_AMBULATORY_CARE_PROVIDER_SITE_OTHER): Payer: Medicare Other

## 2016-08-23 DIAGNOSIS — E538 Deficiency of other specified B group vitamins: Secondary | ICD-10-CM

## 2016-08-23 MED ORDER — CYANOCOBALAMIN 1000 MCG/ML IJ SOLN
1000.0000 ug | Freq: Once | INTRAMUSCULAR | Status: AC
  Administered 2016-08-23: 1000 ug via INTRAMUSCULAR

## 2016-10-01 ENCOUNTER — Other Ambulatory Visit: Payer: Self-pay | Admitting: Family Medicine

## 2016-10-02 NOTE — Telephone Encounter (Signed)
Rx's called in as directed.  

## 2016-10-02 NOTE — Telephone Encounter (Signed)
plz phone in. 

## 2016-10-02 NOTE — Telephone Encounter (Signed)
Ok to refill? Klonopin last filled 06/20/16 #60 1 RF and Tramadol 08/07/16 #50 0RF
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
## 2016-10-06 ENCOUNTER — Encounter: Payer: Self-pay | Admitting: Neurology

## 2016-10-06 ENCOUNTER — Ambulatory Visit (INDEPENDENT_AMBULATORY_CARE_PROVIDER_SITE_OTHER): Payer: Medicare Other | Admitting: Neurology

## 2016-10-06 VITALS — BP 138/80 | HR 76 | Ht 65.5 in | Wt 149.0 lb

## 2016-10-06 DIAGNOSIS — F330 Major depressive disorder, recurrent, mild: Secondary | ICD-10-CM | POA: Diagnosis not present

## 2016-10-06 DIAGNOSIS — G20 Parkinson's disease: Secondary | ICD-10-CM

## 2016-10-06 NOTE — Patient Instructions (Signed)
1. We will send referral to physical therapy at [facility] and they will call you to schedule.   2. Increase Mirapex to 2 tablets in the morning, 1 in the afternoon, 1 in the evening for two weeks Then take 2 in the morning, 1 in the afternoon, 2 in the evening

## 2016-10-12 ENCOUNTER — Other Ambulatory Visit: Payer: Self-pay | Admitting: Neurology

## 2016-10-23 ENCOUNTER — Ambulatory Visit: Payer: Medicare Other | Admitting: Physical Therapy

## 2016-10-25 ENCOUNTER — Other Ambulatory Visit: Payer: Self-pay | Admitting: Neurology

## 2016-10-25 MED ORDER — PRAMIPEXOLE DIHYDROCHLORIDE 0.5 MG PO TABS: ORAL_TABLET | ORAL | 1 refills | Status: DC

## 2016-10-26 ENCOUNTER — Other Ambulatory Visit: Payer: Self-pay | Admitting: Family Medicine

## 2016-10-28 ENCOUNTER — Other Ambulatory Visit: Payer: Self-pay | Admitting: Neurology

## 2016-10-31 ENCOUNTER — Telehealth: Payer: Self-pay | Admitting: Physical Therapy

## 2016-10-31 NOTE — Telephone Encounter (Signed)
10/23/16 patient cxl PT eval 10/31/16 left message re: rescheduling PT eval

## 2016-11-07 ENCOUNTER — Other Ambulatory Visit: Payer: Self-pay | Admitting: Family Medicine

## 2016-11-07 DIAGNOSIS — E559 Vitamin D deficiency, unspecified: Secondary | ICD-10-CM

## 2016-11-07 DIAGNOSIS — E538 Deficiency of other specified B group vitamins: Secondary | ICD-10-CM

## 2016-11-07 DIAGNOSIS — G20 Parkinson's disease: Secondary | ICD-10-CM

## 2016-11-07 DIAGNOSIS — D649 Anemia, unspecified: Secondary | ICD-10-CM

## 2016-11-08 ENCOUNTER — Other Ambulatory Visit: Payer: Medicare Other

## 2016-11-09 ENCOUNTER — Other Ambulatory Visit (INDEPENDENT_AMBULATORY_CARE_PROVIDER_SITE_OTHER): Payer: Medicare Other

## 2016-11-09 DIAGNOSIS — E559 Vitamin D deficiency, unspecified: Secondary | ICD-10-CM | POA: Diagnosis not present

## 2016-11-09 DIAGNOSIS — G20 Parkinson's disease: Secondary | ICD-10-CM | POA: Diagnosis not present

## 2016-11-09 DIAGNOSIS — E538 Deficiency of other specified B group vitamins: Secondary | ICD-10-CM | POA: Diagnosis not present

## 2016-11-09 DIAGNOSIS — D649 Anemia, unspecified: Secondary | ICD-10-CM | POA: Diagnosis not present

## 2016-11-09 LAB — BASIC METABOLIC PANEL
BUN: 16 mg/dL (ref 6–23)
CALCIUM: 9.6 mg/dL (ref 8.4–10.5)
CO2: 30 mEq/L (ref 19–32)
Chloride: 104 mEq/L (ref 96–112)
Creatinine, Ser: 0.83 mg/dL (ref 0.40–1.20)
GFR: 72.58 mL/min (ref >60.00)
GLUCOSE: 90 mg/dL (ref 70–99)
Potassium: 4.4 mEq/L (ref 3.5–5.1)
SODIUM: 139 meq/L (ref 135–145)

## 2016-11-09 LAB — VITAMIN D 25 HYDROXY (VIT D DEFICIENCY, FRACTURES): VITD: 42.74 ng/mL (ref 30.00–100.00)

## 2016-11-09 LAB — VITAMIN B12: Vitamin B-12: 248 pg/mL (ref 211–911)

## 2016-11-09 LAB — TSH: TSH: 1.3 u[IU]/mL (ref 0.35–4.50)

## 2016-11-10 LAB — CBC WITH DIFFERENTIAL/PLATELET
BASOS ABS: 0 10*3/uL (ref 0.0–0.1)
Basophils Relative: 0.5 % (ref 0.0–3.0)
Eosinophils Absolute: 0.2 10*3/uL (ref 0.0–0.7)
Eosinophils Relative: 4.7 % (ref 0.0–5.0)
HCT: 34.7 % — ABNORMAL LOW (ref 36.0–46.0)
Hemoglobin: 11.5 g/dL — ABNORMAL LOW (ref 12.0–15.0)
LYMPHS ABS: 2.2 10*3/uL (ref 0.7–4.0)
Lymphocytes Relative: 44.3 % (ref 12.0–46.0)
MCHC: 33 g/dL (ref 30.0–36.0)
MCV: 81.8 fl (ref 78.0–100.0)
MONO ABS: 0.3 10*3/uL (ref 0.1–1.0)
MONOS PCT: 6.5 % (ref 3.0–12.0)
NEUTROS PCT: 44 % (ref 43.0–77.0)
Neutro Abs: 2.2 10*3/uL (ref 1.4–7.7)
Platelets: 254 10*3/uL (ref 150.0–400.0)
RBC: 4.24 Mil/uL (ref 3.87–5.11)
RDW: 15.1 % (ref 11.5–15.5)
WBC: 4.9 10*3/uL (ref 4.0–10.5)

## 2016-11-14 ENCOUNTER — Ambulatory Visit (INDEPENDENT_AMBULATORY_CARE_PROVIDER_SITE_OTHER): Payer: Medicare Other | Admitting: Family Medicine

## 2016-11-14 ENCOUNTER — Encounter: Payer: Self-pay | Admitting: Family Medicine

## 2016-11-14 VITALS — BP 100/70 | HR 70 | Ht 64.75 in | Wt 152.0 lb

## 2016-11-14 DIAGNOSIS — G20 Parkinson's disease: Secondary | ICD-10-CM

## 2016-11-14 DIAGNOSIS — F321 Major depressive disorder, single episode, moderate: Secondary | ICD-10-CM

## 2016-11-14 DIAGNOSIS — E538 Deficiency of other specified B group vitamins: Secondary | ICD-10-CM | POA: Diagnosis not present

## 2016-11-14 DIAGNOSIS — Z23 Encounter for immunization: Secondary | ICD-10-CM

## 2016-11-14 DIAGNOSIS — Z0000 Encounter for general adult medical examination without abnormal findings: Secondary | ICD-10-CM | POA: Diagnosis not present

## 2016-11-14 DIAGNOSIS — Z639 Problem related to primary support group, unspecified: Secondary | ICD-10-CM

## 2016-11-14 DIAGNOSIS — E785 Hyperlipidemia, unspecified: Secondary | ICD-10-CM

## 2016-11-14 DIAGNOSIS — F411 Generalized anxiety disorder: Secondary | ICD-10-CM

## 2016-11-14 DIAGNOSIS — Z7189 Other specified counseling: Secondary | ICD-10-CM

## 2016-11-14 DIAGNOSIS — M810 Age-related osteoporosis without current pathological fracture: Secondary | ICD-10-CM

## 2016-11-14 DIAGNOSIS — D649 Anemia, unspecified: Secondary | ICD-10-CM

## 2016-11-14 DIAGNOSIS — M797 Fibromyalgia: Secondary | ICD-10-CM

## 2016-11-14 DIAGNOSIS — G20A1 Parkinson's disease without dyskinesia, without mention of fluctuations: Secondary | ICD-10-CM

## 2016-11-14 DIAGNOSIS — I2510 Atherosclerotic heart disease of native coronary artery without angina pectoris: Secondary | ICD-10-CM

## 2016-11-14 MED ORDER — CYANOCOBALAMIN 1000 MCG/ML IJ SOLN
1000.0000 ug | Freq: Once | INTRAMUSCULAR | Status: AC
  Administered 2016-11-14: 1000 ug via INTRAMUSCULAR

## 2016-11-14 NOTE — Assessment & Plan Note (Signed)
Stable period on sertraline 50mg  daily. Declines increased dose.

## 2016-11-14 NOTE — Progress Notes (Signed)
BP 100/70   Pulse 70   Ht 5' 4.75" (1.645 m)   Wt 152 lb (68.9 kg)   SpO2 94%   BMI 25.49 kg/m    CC: CPE Subjective:    Patient ID: [name], female    DOB: 1946-07-20, 70 y.o.   MRN: [id number]  HPI: [name] is a 70 y.o. female presenting on 11/14/2016 for Medicare Wellness   Saw Katha Cabal for medicare wellness visit 3 months ago, note reviewed.  Preventative: COLONOSCOPY Date: 06/2008 h/o polyps but latest WNL, rec rpt 10 yrs Olevia Perches).  DEXA Date: 04/2013 T -2.9 at femur, -1.6 at spine. Last reclast 06/2014. Due for rpt.  Mammogram 07/2014 WNL. Gets Q2 yrs. Will call to schedule.  Well woman - s/p hysterectomy and oophorectomy.  Flu yearly prevnar 04/2014, pneumovax 04/2015 Tdap 2012  Zostavax 2011  Advanced directives: Discussing with son [name] from [place] who would be HCPOA. Has packet at home.  Seat belt use discussed.  Sunscreen use discussed. No changing moles on skin.  Non smoker Alcohol - none  Lives with daughter, 1 dog. Widow of husband [name]) 2015. 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   Relevant past medical, surgical, family and social history reviewed and updated as indicated. Interim medical history since our last visit reviewed. Allergies and medications reviewed and updated. Current Outpatient Prescriptions on File Prior to Visit  Medication Sig  . Calcium Carb-Cholecalciferol (CALCIUM + D3) 600-200 MG-UNIT TABS Take 2 tablets by mouth daily.  . carbidopa-levodopa (SINEMET CR) 50-200 MG tablet TAKE 1 TABLET BY MOUTH AT BEDTIME.  . carbidopa-levodopa (SINEMET IR) 25-100 MG tablet TAKE 1 TABLET BY MOUTH 3 (THREE) TIMES DAILY.  . cholecalciferol (VITAMIN D) 1000 UNITS tablet Take 1,000 Units by mouth daily.  . clonazePAM (KLONOPIN) 0.5 MG tablet TAKE 1  TABLET TWICE A DAY AS NEEDED  . cyanocobalamin (,VITAMIN B-12,) 1000 MCG/ML injection Inject 1 mL (1,000 mcg total) into the muscle every 30 (thirty) days.  . ferrous sulfate 325 (65 FE) MG tablet Take 1 tablet (325 mg total) by mouth every Monday, Wednesday, and Friday.  . gabapentin (NEURONTIN) 100 MG capsule Take 1 capsule (100 mg total) by mouth at bedtime.  Marland Kitchen latanoprost (XALATAN) 0.005 % ophthalmic solution Place 1 drop into both eyes at bedtime.  . Melatonin 3 MG CAPS Take 3 mg by mouth at bedtime.  Marland Kitchen omeprazole (PRILOSEC) 40 MG capsule TAKE ONE CAPSULE BY MOUTH EVERY DAY  . polyethylene glycol (GLYCOLAX) packet Take 17 g by mouth as needed.    . pramipexole (MIRAPEX) 0.5 MG tablet 2 in the morning, 1 in the afternoon, 2 in the evening  . senna (SENOKOT) 8.6 MG tablet Take 1 tablet by mouth daily.    . sertraline (ZOLOFT) 50 MG tablet TAKE 1 TABLET (50 MG TOTAL) BY MOUTH DAILY.  . traMADol (ULTRAM) 50 MG tablet TAKE 1 TABLET BY MOUTH 3 TIMES A DAY AS NEEDED FOR PAIN  . triamcinolone cream (KENALOG) 0.5 % Apply to affected area twice daily for no more than 2 weeks at a time   No current facility-administered medications on file prior to visit.     Review of Systems  Constitutional: Positive for appetite change (increased). Negative for activity change, chills, fatigue, fever and unexpected weight change.  HENT: Negative  for hearing loss.   Eyes: Negative for visual disturbance.  Respiratory: Positive for cough (intermittent at night time). Negative for chest tightness, shortness of breath and wheezing.   Cardiovascular: Negative for chest pain, palpitations and leg swelling.  Gastrointestinal: Negative for abdominal distention, abdominal pain, blood in stool, constipation, diarrhea, nausea and vomiting.  Genitourinary: Negative for difficulty urinating and hematuria.  Musculoskeletal: Positive for back pain. Negative for arthralgias, myalgias and neck pain.  Skin: Negative for rash.    Neurological: Positive for dizziness (PD related) and headaches. Negative for seizures and syncope.  Hematological: Negative for adenopathy. Does not bruise/bleed easily.  Psychiatric/Behavioral: Positive for dysphoric mood (stable). The patient is not nervous/anxious.    Per HPI unless specifically indicated in ROS section     Objective:    BP 100/70   Pulse 70   Ht 5' 4.75" (1.645 m)   Wt 152 lb (68.9 kg)   SpO2 94%   BMI 25.49 kg/m   Wt Readings from Last 3 Encounters:  11/14/16 152 lb (68.9 kg)  10/06/16 149 lb (67.6 kg)  08/11/16 143 lb (64.9 kg)    Physical Exam Results for orders placed or performed in visit on [id number]  Basic metabolic panel  Result Value Ref Range   Sodium 139 135 - 145 mEq/L   Potassium 4.4 3.5 - 5.1 mEq/L   Chloride 104 96 - 112 mEq/L   CO2 30 19 - 32 mEq/L   Glucose, Bld 90 70 - 99 mg/dL   BUN 16 6 - 23 mg/dL   Creatinine, Ser 0.83 0.40 - 1.20 mg/dL   Calcium 9.6 8.4 - 10.5 mg/dL   GFR 72.58 >60.00 mL/min  CBC with Differential/Platelet  Result Value Ref Range   WBC 4.9 4.0 - 10.5 K/uL   RBC 4.24 3.87 - 5.11 Mil/uL   Hemoglobin 11.5 (L) 12.0 - 15.0 g/dL   HCT 34.7 (L) 36.0 - 46.0 %   MCV 81.8 78.0 - 100.0 fl   MCHC 33.0 30.0 - 36.0 g/dL   RDW 15.1 11.5 - 15.5 %   Platelets 254.0 150.0 - 400.0 K/uL   Neutrophils Relative % 44.0 43.0 - 77.0 %   Lymphocytes Relative 44.3 12.0 - 46.0 %   Monocytes Relative 6.5 3.0 - 12.0 %   Eosinophils Relative 4.7 0.0 - 5.0 %   Basophils Relative 0.5 0.0 - 3.0 %   Neutro Abs 2.2 1.4 - 7.7 K/uL   Lymphs Abs 2.2 0.7 - 4.0 K/uL   Monocytes Absolute 0.3 0.1 - 1.0 K/uL   Eosinophils Absolute 0.2 0.0 - 0.7 K/uL   Basophils Absolute 0.0 0.0 - 0.1 K/uL  TSH  Result Value Ref Range   TSH 1.30 0.35 - 4.50 uIU/mL  Vitamin B12  Result Value Ref Range   Vitamin B-12 248 211 - 911 pg/mL  VITAMIN D 25 Hydroxy (Vit-D Deficiency, Fractures)  Result Value Ref Range   VITD 42.74 30.00 - 100.00 ng/mL   Lab  Results  Component Value Date   CHOL 185 12/01/2015   HDL 77.80 12/01/2015   LDLCALC 99 12/01/2015   LDLDIRECT 105.5 07/20/2010   TRIG 39.0 12/01/2015   CHOLHDL 2 12/01/2015       Assessment & Plan:   Problem List Items Addressed This Visit    Advanced care planning/counseling discussion    Advanced directives: Discussing with son [name] from [place] who would be HCPOA. Has packet at home.       Anemia    Mild,  improving. Other cell lines normal. Continue to monitor.      Relevant Medications   cyanocobalamin ((VITAMIN B-12)) injection 1,000 mcg (Completed)   B12 deficiency    Low normal despite regular replacement. b12 shot today. Add oral b12 539mcg daily. Pt agrees with plan. Consider adding IF to next labs.       Relevant Medications   cyanocobalamin ((VITAMIN B-12)) injection 1,000 mcg (Completed)   CAD (coronary artery disease)    Check FLP next fasting labs. Stable off statin.       Family circumstance    Increased stress recently over son's grave illness. She has been his caregiver over last 6 months.      Fibromyalgia    Chronic, stable. Continue gabapentin.      GAD (generalized anxiety disorder)    Continue daily klonopin and sertraline.       Health maintenance examination - Primary    Preventative protocols reviewed and updated unless pt declined. Discussed healthy diet and lifestyle.       HLD (hyperlipidemia)    Check FLP next visit. Off statin.       MDD (major depressive disorder), single episode, moderate (HCC)    Stable period on sertraline 50mg  daily. Declines increased dose.       Osteoporosis    DEXA 2014. No reclast in 2 yrs - will reorder.  H/o intolerance to oral bisphosphonates (dysphagia, gi distress)      Parkinson's disease ([place])    Appreciate excellent care by Dr [name] neurology.        Other Visit Diagnoses    Encounter for immunization       Relevant Orders   Flu Vaccine QUAD 36+ mos IM (Completed)        Follow up plan: Return in about 4 months (around 03/15/2017) for follow up visit.  [name], MD

## 2016-11-14 NOTE — Assessment & Plan Note (Signed)
Preventative protocols reviewed and updated unless pt declined. Discussed healthy diet and lifestyle.  

## 2016-11-14 NOTE — Assessment & Plan Note (Signed)
Mild, improving. Other cell lines normal. Continue to monitor.

## 2016-11-14 NOTE — Assessment & Plan Note (Signed)
Chronic, stable. Continue gabapentin.  

## 2016-11-14 NOTE — Assessment & Plan Note (Signed)
DEXA 2014. No reclast in 2 yrs - will reorder.  H/o intolerance to oral bisphosphonates (dysphagia, gi distress)

## 2016-11-14 NOTE — Patient Instructions (Addendum)
Flu shot today. b12 shot today We will set you up for reclast infusion - call us in 2 weeks if you haven't heard from [place].  Call breast center to schedule mammogram [phone] Handicap placard application filled today Continue monthly b12 shots, add 52mg b12 orally daily.  Return as needed or in 4 months for follow up visit.   Health Maintenance, Female Introduction Adopting a healthy lifestyle and getting preventive care can go a long way to promote health and wellness. Talk with your health care provider about what schedule of regular examinations is right for you. This is a good chance for you to check in with your provider about disease prevention and staying healthy. In between checkups, there are plenty of things you can do on your own. Experts have done a lot of research about which lifestyle changes and preventive measures are most likely to keep you healthy. Ask your health care provider for more information. Weight and diet Eat a healthy diet  Be sure to include plenty of vegetables, fruits, low-fat dairy products, and lean protein.  Do not eat a lot of foods high in solid fats, added sugars, or salt.  Get regular exercise. This is one of the most important things you can do for your health.  Most adults should exercise for at least 150 minutes each week. The exercise should increase your heart rate and make you sweat (moderate-intensity exercise).  Most adults should also do strengthening exercises at least twice a week. This is in addition to the moderate-intensity exercise. Maintain a healthy weight  Body mass index (BMI) is a measurement that can be used to identify possible weight problems. It estimates body fat based on height and weight. Your health care provider can help determine your BMI and help you achieve or maintain a healthy weight.  For females 260years of age and older:  A BMI below 18.5 is considered underweight.  A BMI of 18.5 to 24.9 is normal.  A  BMI of 25 to 29.9 is considered overweight.  A BMI of 30 and above is considered obese. Watch levels of cholesterol and blood lipids  You should start having your blood tested for lipids and cholesterol at 68years of age, then have this test every 5 years.  You may need to have your cholesterol levels checked more often if:  Your lipid or cholesterol levels are high.  You are older than 68years of age.  You are at high risk for heart disease. Cancer screening Lung Cancer  Lung cancer screening is recommended for adults [id number] old who are at high risk for lung cancer because of a history of smoking.  A yearly low-dose CT scan of the lungs is recommended for people who:  Currently smoke.  Have quit within the past 15 years.  Have at least a 30-pack-year history of smoking. A pack year is smoking an average of one pack of cigarettes a day for 1 year.  Yearly screening should continue until it has been 15 years since you quit.  Yearly screening should stop if you develop a health problem that would prevent you from having lung cancer treatment. Breast Cancer  Practice breast self-awareness. This means understanding how your breasts normally appear and feel.  It also means doing regular breast self-exams. Let your health care provider know about any changes, no matter how small.  If you are in your 20s or 30s, you should have a clinical breast exam (CBE) by a  health care provider every 1-3 years as part of a regular health exam.  If you are 40 or older, have a CBE every year. Also consider having a breast X-ray (mammogram) every year.  If you have a family history of breast cancer, talk to your health care provider about genetic screening.  If you are at high risk for breast cancer, talk to your health care provider about having an MRI and a mammogram every year.  Breast cancer gene (BRCA) assessment is recommended for women who have family members with BRCA-related  cancers. BRCA-related cancers include:  Breast.  Ovarian.  Tubal.  Peritoneal cancers.  Results of the assessment will determine the need for genetic counseling and BRCA1 and BRCA2 testing. Cervical Cancer  Your health care provider may recommend that you be screened regularly for cancer of the pelvic organs (ovaries, uterus, and vagina). This screening involves a pelvic examination, including checking for microscopic changes to the surface of your cervix (Pap test). You may be encouraged to have this screening done every 3 years, beginning at age 75.  For women ages 51-65, health care providers may recommend pelvic exams and Pap testing every 3 years, or they may recommend the Pap and pelvic exam, combined with testing for human papilloma virus (HPV), every 5 years. Some types of HPV increase your risk of cervical cancer. Testing for HPV may also be done on women of any age with unclear Pap test results.  Other health care providers may not recommend any screening for nonpregnant women who are considered low risk for pelvic cancer and who do not have symptoms. Ask your health care provider if a screening pelvic exam is right for you.  If you have had past treatment for cervical cancer or a condition that could lead to cancer, you need Pap tests and screening for cancer for at least 20 years after your treatment. If Pap tests have been discontinued, your risk factors (such as having a new sexual partner) need to be reassessed to determine if screening should resume. Some women have medical problems that increase the chance of getting cervical cancer. In these cases, your health care provider may recommend more frequent screening and Pap tests. Colorectal Cancer  This type of cancer can be detected and often prevented.  Routine colorectal cancer screening usually begins at 68 years of age and continues through 68 years of age.  Your health care provider may recommend screening at an earlier  age if you have risk factors for colon cancer.  Your health care provider may also recommend using home test kits to check for hidden blood in the stool.  A small camera at the end of a tube can be used to examine your colon directly (sigmoidoscopy or colonoscopy). This is done to check for the earliest forms of colorectal cancer.  Routine screening usually begins at age 59.  Direct examination of the colon should be repeated every 5-10 years through 68 years of age. However, you may need to be screened more often if early forms of precancerous polyps or small growths are found. Skin Cancer  Check your skin from head to toe regularly.  Tell your health care provider about any new moles or changes in moles, especially if there is a change in a mole's shape or color.  Also tell your health care provider if you have a mole that is larger than the size of a pencil eraser.  Always use sunscreen. Apply sunscreen liberally and repeatedly throughout the day.  Protect yourself by wearing long sleeves, pants, a wide-brimmed hat, and sunglasses whenever you are outside. Heart disease, diabetes, and high blood pressure  High blood pressure causes heart disease and increases the risk of stroke. High blood pressure is more likely to develop in:  People who have blood pressure in the high end of the normal range (130-139/85-89 mm Hg).  People who are overweight or obese.  People who are African American.  If you are 38-75 years of age, have your blood pressure checked every 3-5 years. If you are 47 years of age or older, have your blood pressure checked every year. You should have your blood pressure measured twice-once when you are at a hospital or clinic, and once when you are not at a hospital or clinic. Record the average of the two measurements. To check your blood pressure when you are not at a hospital or clinic, you can use:  An automated blood pressure machine at a pharmacy.  A home blood  pressure monitor.  If you are between 72 years and 51 years old, ask your health care provider if you should take aspirin to prevent strokes.  Have regular diabetes screenings. This involves taking a blood sample to check your fasting blood sugar level.  If you are at a normal weight and have a low risk for diabetes, have this test once every three years after 68 years of age.  If you are overweight and have a high risk for diabetes, consider being tested at a younger age or more often. Preventing infection Hepatitis B  If you have a higher risk for hepatitis B, you should be screened for this virus. You are considered at high risk for hepatitis B if:  You were born in a country where hepatitis B is common. Ask your health care provider which countries are considered high risk.  Your parents were born in a high-risk country, and you have not been immunized against hepatitis B (hepatitis B vaccine).  You have HIV or AIDS.  You use needles to inject street drugs.  You live with someone who has hepatitis B.  You have had sex with someone who has hepatitis B.  You get hemodialysis treatment.  You take certain medicines for conditions, including cancer, organ transplantation, and autoimmune conditions. Hepatitis C  Blood testing is recommended for:  Everyone born from [birth year] through 1963.  Anyone with known risk factors for hepatitis C. Sexually transmitted infections (STIs)  You should be screened for sexually transmitted infections (STIs) including gonorrhea and chlamydia if:  You are sexually active and are younger than 68 years of age.  You are older than 68 years of age and your health care provider tells you that you are at risk for this type of infection.  Your sexual activity has changed since you were last screened and you are at an increased risk for chlamydia or gonorrhea. Ask your health care provider if you are at risk.  If you do not have HIV, but are at risk, it  may be recommended that you take a prescription medicine daily to prevent HIV infection. This is called pre-exposure prophylaxis (PrEP). You are considered at risk if:  You are sexually active and do not regularly use condoms or know the HIV status of your partner(s).  You take drugs by injection.  You are sexually active with a partner who has HIV. Talk with your health care provider about whether you are at high risk of being infected with HIV.  If you choose to begin PrEP, you should first be tested for HIV. You should then be tested every 3 months for as long as you are taking PrEP. Pregnancy  If you are premenopausal and you may become pregnant, ask your health care provider about preconception counseling.  If you may become pregnant, take 400 to 800 micrograms (mcg) of folic acid every day.  If you want to prevent pregnancy, talk to your health care provider about birth control (contraception). Osteoporosis and menopause  Osteoporosis is a disease in which the bones lose minerals and strength with aging. This can result in serious bone fractures. Your risk for osteoporosis can be identified using a bone density scan.  If you are 11 years of age or older, or if you are at risk for osteoporosis and fractures, ask your health care provider if you should be screened.  Ask your health care provider whether you should take a calcium or vitamin D supplement to lower your risk for osteoporosis.  Menopause may have certain physical symptoms and risks.  Hormone replacement therapy may reduce some of these symptoms and risks. Talk to your health care provider about whether hormone replacement therapy is right for you. Follow these instructions at home:  Schedule regular health, dental, and eye exams.  Stay current with your immunizations.  Do not use any tobacco products including cigarettes, chewing tobacco, or electronic cigarettes.  If you are pregnant, do not drink alcohol.  If you  are breastfeeding, limit how much and how often you drink alcohol.  Limit alcohol intake to no more than 1 drink per day for nonpregnant women. One drink equals 12 ounces of beer, 5 ounces of wine, or 1 ounces of hard liquor.  Do not use street drugs.  Do not share needles.  Ask your health care provider for help if you need support or information about quitting drugs.  Tell your health care provider if you often feel depressed.  Tell your health care provider if you have ever been abused or do not feel safe at home. This information is not intended to replace advice given to you by your health care provider. Make sure you discuss any questions you have with your health care provider. Document Released: 05/29/2011 Document Revised: 04/20/2016 Document Reviewed: 08/17/2015  2017 [employer]

## 2016-11-14 NOTE — Assessment & Plan Note (Signed)
Advanced directives: Discussing with son [name] from [place] who would be HCPOA. Has packet at home.  

## 2016-11-14 NOTE — Assessment & Plan Note (Signed)
Increased stress recently over son's grave illness. She has been his caregiver over last 6 months.

## 2016-11-14 NOTE — Assessment & Plan Note (Signed)
Appreciate excellent care by Dr [name] neurology.

## 2016-11-14 NOTE — Assessment & Plan Note (Signed)
Check FLP next fasting labs. Stable off statin.

## 2016-11-14 NOTE — Assessment & Plan Note (Addendum)
Low normal despite regular replacement. b12 shot today. Add oral b12 575mcg daily. Pt agrees with plan. Consider adding IF to next labs.

## 2016-11-14 NOTE — Assessment & Plan Note (Signed)
Continue daily klonopin and sertraline.

## 2016-11-14 NOTE — Assessment & Plan Note (Signed)
Check FLP next visit. Off statin.

## 2016-11-15 ENCOUNTER — Telehealth: Payer: Self-pay | Admitting: *Deleted

## 2016-11-15 NOTE — Telephone Encounter (Signed)
Order faxed to [facility]. Attempted to call patient. No answer, no machine. Will try again later.

## 2016-11-15 NOTE — Telephone Encounter (Signed)
Order for Reclast in your IN box

## 2016-11-15 NOTE — Telephone Encounter (Signed)
Signed and returned to [name]/

## 2016-11-20 ENCOUNTER — Other Ambulatory Visit: Payer: Self-pay | Admitting: Family Medicine

## 2016-11-21 NOTE — Telephone Encounter (Signed)
Rx called in as directed.   

## 2016-11-21 NOTE — Telephone Encounter (Signed)
plz phone in. 
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
## 2016-12-06 ENCOUNTER — Ambulatory Visit: Payer: Medicare Other | Admitting: Neurology
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
## 2016-12-27 ENCOUNTER — Ambulatory Visit (INDEPENDENT_AMBULATORY_CARE_PROVIDER_SITE_OTHER): Payer: Medicare Other | Admitting: *Deleted

## 2016-12-27 DIAGNOSIS — E538 Deficiency of other specified B group vitamins: Secondary | ICD-10-CM | POA: Diagnosis not present

## 2016-12-27 MED ORDER — CYANOCOBALAMIN 1000 MCG/ML IJ SOLN
1000.0000 ug | Freq: Once | INTRAMUSCULAR | Status: AC
  Administered 2016-12-27: 1000 ug via INTRAMUSCULAR

## 2016-12-28 ENCOUNTER — Encounter: Payer: Self-pay | Admitting: Neurology

## 2016-12-28 ENCOUNTER — Ambulatory Visit (INDEPENDENT_AMBULATORY_CARE_PROVIDER_SITE_OTHER): Payer: Medicare Other | Admitting: Neurology

## 2016-12-28 VITALS — BP 100/60 | HR 76 | Ht 64.5 in | Wt 156.0 lb

## 2016-12-28 DIAGNOSIS — F330 Major depressive disorder, recurrent, mild: Secondary | ICD-10-CM

## 2016-12-28 DIAGNOSIS — G20 Parkinson's disease: Secondary | ICD-10-CM

## 2016-12-28 DIAGNOSIS — E538 Deficiency of other specified B group vitamins: Secondary | ICD-10-CM

## 2016-12-28 MED ORDER — SERTRALINE HCL 100 MG PO TABS: 100.0000 mg | ORAL_TABLET | Freq: Every day | ORAL | 1 refills | Status: DC

## 2016-12-28 MED ORDER — PRAMIPEXOLE DIHYDROCHLORIDE 1 MG PO TABS: 1.0000 mg | ORAL_TABLET | Freq: Three times a day (TID) | ORAL | 1 refills | Status: DC

## 2016-12-28 NOTE — Addendum Note (Signed)
Addended byAnnamaria Helling on: 12/28/2016 09:19 AM   Modules accepted: Orders

## 2016-12-28 NOTE — Patient Instructions (Addendum)
1. Appt scheduled with Dr. Karsten Ro at [facility] for 01/02/17 at 9:45 am. If this is not a good date/time please call [phone] to reschedule.   2. We have sent a referral to outpatient rehab at [facility] and they will call you to schedule an appt. If you do not hear from them, they can be reached at [phone].   3. Increase Pramipexole to 1 mg tablets - 1 three times daily.   4. Increase Zoloft to 100 mg once daily.

## 2017-01-01 ENCOUNTER — Telehealth: Payer: Self-pay | Admitting: Neurology

## 2017-01-01 NOTE — Telephone Encounter (Signed)
Last office note faxed to [facility] at [phone] with confirmation received.

## 2017-01-11 ENCOUNTER — Other Ambulatory Visit: Payer: Self-pay | Admitting: Family Medicine

## 2017-01-12 NOTE — Telephone Encounter (Signed)
Rx called in as directed.   

## 2017-01-12 NOTE — Telephone Encounter (Signed)
plz phone in. 

## 2017-01-30 ENCOUNTER — Other Ambulatory Visit: Payer: Self-pay | Admitting: Family Medicine

## 2017-01-31 ENCOUNTER — Ambulatory Visit (INDEPENDENT_AMBULATORY_CARE_PROVIDER_SITE_OTHER): Payer: Medicare Other

## 2017-01-31 DIAGNOSIS — E538 Deficiency of other specified B group vitamins: Secondary | ICD-10-CM | POA: Diagnosis not present

## 2017-01-31 MED ORDER — CYANOCOBALAMIN 1000 MCG/ML IJ SOLN
1000.0000 ug | Freq: Once | INTRAMUSCULAR | Status: AC
  Administered 2017-01-31: 1000 ug via INTRAMUSCULAR

## 2017-02-12 ENCOUNTER — Telehealth: Payer: Self-pay | Admitting: Neurology

## 2017-02-12 NOTE — Telephone Encounter (Signed)
PT called and wants to know if her Parkinson's medication is causing her hair to fall out/Dawn CB# [phone]

## 2017-02-12 NOTE — Telephone Encounter (Signed)
Nope.

## 2017-02-12 NOTE — Telephone Encounter (Signed)
Patient is taking Carbidopa Levodopa 25/100 IR, Carbidopa Levodopa 50/200 CR and Pramipexole 1 mg. Please advise.

## 2017-02-13 NOTE — Telephone Encounter (Signed)
LMOM making patient aware no correlation.

## 2017-02-18 ENCOUNTER — Other Ambulatory Visit: Payer: Self-pay | Admitting: Neurology

## 2017-02-26 ENCOUNTER — Other Ambulatory Visit: Payer: Self-pay | Admitting: *Deleted

## 2017-02-26 NOTE — Telephone Encounter (Signed)
Last Rx 10/02/2016 #60 1R. Last OV 10/2016-CPE

## 2017-02-27 MED ORDER — CLONAZEPAM 0.5 MG PO TABS: 0.5000 mg | ORAL_TABLET | Freq: Two times a day (BID) | ORAL | 1 refills | Status: DC | PRN

## 2017-02-27 NOTE — Telephone Encounter (Signed)
plz phone in. 

## 2017-02-27 NOTE — Telephone Encounter (Signed)
Rx called in as prescribed 

## 2017-03-15 ENCOUNTER — Ambulatory Visit (INDEPENDENT_AMBULATORY_CARE_PROVIDER_SITE_OTHER): Payer: Medicare Other | Admitting: Family Medicine

## 2017-03-15 ENCOUNTER — Encounter: Payer: Self-pay | Admitting: Family Medicine

## 2017-03-15 VITALS — BP 108/74 | HR 74 | Temp 98.3°F | Wt 156.0 lb

## 2017-03-15 DIAGNOSIS — E538 Deficiency of other specified B group vitamins: Secondary | ICD-10-CM | POA: Diagnosis not present

## 2017-03-15 DIAGNOSIS — K219 Gastro-esophageal reflux disease without esophagitis: Secondary | ICD-10-CM

## 2017-03-15 DIAGNOSIS — Z639 Problem related to primary support group, unspecified: Secondary | ICD-10-CM

## 2017-03-15 DIAGNOSIS — M810 Age-related osteoporosis without current pathological fracture: Secondary | ICD-10-CM | POA: Diagnosis not present

## 2017-03-15 DIAGNOSIS — E559 Vitamin D deficiency, unspecified: Secondary | ICD-10-CM | POA: Diagnosis not present

## 2017-03-15 LAB — BASIC METABOLIC PANEL
BUN: 20 mg/dL (ref 6–23)
CHLORIDE: 105 meq/L (ref 96–112)
CO2: 29 mEq/L (ref 19–32)
CREATININE: 0.83 mg/dL (ref 0.40–1.20)
Calcium: 9.6 mg/dL (ref 8.4–10.5)
GFR: 72.51 mL/min (ref >60.00)
GLUCOSE: 87 mg/dL (ref 70–99)
POTASSIUM: 3.5 meq/L (ref 3.5–5.1)
Sodium: 141 mEq/L (ref 135–145)

## 2017-03-15 LAB — VITAMIN D 25 HYDROXY (VIT D DEFICIENCY, FRACTURES): VITD: 46.01 ng/mL (ref 30.00–100.00)

## 2017-03-15 LAB — VITAMIN B12: VITAMIN B 12: 1025 pg/mL — AB (ref 211–911)

## 2017-03-15 MED ORDER — OMEPRAZOLE 40 MG PO CPDR: 40.0000 mg | DELAYED_RELEASE_CAPSULE | Freq: Every day | ORAL | 3 refills | Status: DC

## 2017-03-15 MED ORDER — TRAMADOL HCL 50 MG PO TABS: 50.0000 mg | ORAL_TABLET | Freq: Three times a day (TID) | ORAL | 0 refills | Status: DC | PRN

## 2017-03-15 MED ORDER — CYANOCOBALAMIN 1000 MCG/ML IJ SOLN
1000.0000 ug | Freq: Once | INTRAMUSCULAR | Status: AC
  Administered 2017-03-15: 1000 ug via INTRAMUSCULAR

## 2017-03-15 NOTE — Assessment & Plan Note (Addendum)
No recent reclast - pt told needed labs first. Has trouble scheduling infusions and would like to come to office instead.  DEXA 2014 - will order update.  Pt interested in prolia - discussed mechanism of action and side effects. Will see if we can set patient up for this. Check labs today.  h/o intolerance to oral bisphosphonates (GI upset).

## 2017-03-15 NOTE — Patient Instructions (Addendum)
We will see if you are eligible for insurance coverage for prolia.  Labs today THEN B12 shot today.  For cough - restart omeprazole 40mg  daily. I think this is heartburn related.  Schedule sooner appointment with eye doctor.  Return in 4 months for follow up visit

## 2017-03-15 NOTE — Assessment & Plan Note (Signed)
Update vit D level.  

## 2017-03-15 NOTE — Assessment & Plan Note (Signed)
Continues caring for son who is also my patient. Discussed upcoming move to [facility] to be closer to son.

## 2017-03-15 NOTE — Addendum Note (Signed)
Addended by: Modena Nunnery on: 03/15/2017 10:59 AM   Modules accepted: Orders

## 2017-03-15 NOTE — Progress Notes (Signed)
Pre visit review using our clinic review tool, if applicable. No additional management support is needed unless otherwise documented below in the visit note. 

## 2017-03-15 NOTE — Assessment & Plan Note (Signed)
b12 shot today. Check IF.

## 2017-03-15 NOTE — Progress Notes (Signed)
BP 108/74   Pulse 74   Temp 98.3 F (36.8 C) (Oral)   Wt 156 lb (70.8 kg)   SpO2 98%   BMI 26.36 kg/m    CC: 40mo f/u visit Subjective:    Patient ID: [name], female    DOB: 07/09/1946, 71 y.o.   MRN: [id number]  HPI: [name] is a 71 y.o. female presenting on 03/15/2017 for Follow-up and Cough (after eating)   Planned move to [place].   Cough - present over months, progressively worsening in the past month. Worse in evenings and after meals. Feeling of water brash in back of throat. No significant GERD. No trouble swallowing. Ran out of PPI several months ago.   OP - no recent reclast injection in last 3 yrs - previously received at [facility] short stay. Told needed labs within 30 days. Last DEXA 2014. h/o intolerance to oral bisphosphonates.   Relevant past medical, surgical, family and social history reviewed and updated as indicated. Interim medical history since our last visit reviewed. Allergies and medications reviewed and updated. Outpatient Medications Prior to Visit  Medication Sig Dispense Refill  . Calcium Carb-Cholecalciferol (CALCIUM + D3) 600-200 MG-UNIT TABS Take 2 tablets by mouth daily.    . carbidopa-levodopa (SINEMET CR) 50-200 MG tablet TAKE 1 TABLET BY MOUTH AT BEDTIME. 90 tablet 1  . carbidopa-levodopa (SINEMET IR) 25-100 MG tablet TAKE 1 TABLET BY MOUTH 3 (THREE) TIMES DAILY. 270 tablet 1  . cholecalciferol (VITAMIN D) 1000 UNITS tablet Take 1,000 Units by mouth daily.    . clonazePAM (KLONOPIN) 0.5 MG tablet Take 1 tablet (0.5 mg total) by mouth 2 (two) times daily as needed. 60 tablet 1  . cyanocobalamin (,VITAMIN B-12,) 1000 MCG/ML injection Inject 1 mL (1,000 mcg total) into the muscle every 30 (thirty) days.    . ferrous sulfate 325 (65 FE) MG tablet Take 1 tablet (325 mg total) by mouth every Monday, Wednesday, and Friday.    . gabapentin (NEURONTIN) 100 MG capsule TAKE 1 CAPSULE (100 MG TOTAL) BY MOUTH AT BEDTIME. 90 capsule 1  .  latanoprost (XALATAN) 0.005 % ophthalmic solution Place 1 drop into both eyes at bedtime.    . Melatonin 3 MG CAPS Take 3 mg by mouth at bedtime.    . polyethylene glycol (GLYCOLAX) packet Take 17 g by mouth as needed.      . pramipexole (MIRAPEX) 1 MG tablet Take 1 tablet (1 mg total) by mouth 3 (three) times daily. 270 tablet 1  . senna (SENOKOT) 8.6 MG tablet Take 1 tablet by mouth daily.      . sertraline (ZOLOFT) 100 MG tablet Take 1 tablet (100 mg total) by mouth daily. 90 tablet 1  . triamcinolone cream (KENALOG) 0.5 % Apply to affected area twice daily for no more than 2 weeks at a time 30 g 1  . vitamin B-12 (CYANOCOBALAMIN) 1000 MCG tablet Take 1,000 mcg by mouth daily.    Marland Kitchen omeprazole (PRILOSEC) 40 MG capsule TAKE ONE CAPSULE BY MOUTH EVERY DAY 90 capsule 3  . traMADol (ULTRAM) 50 MG tablet TAKE 1 TABLET BY MOUTH 3 TIMES A DAY AS NEEDED 50 tablet 0   No facility-administered medications prior to visit.      Per HPI unless specifically indicated in ROS section below Review of Systems     Objective:    BP 108/74   Pulse 74   Temp 98.3 F (36.8 C) (Oral)   Wt 156 lb (70.8  kg)   SpO2 98%   BMI 26.36 kg/m   Wt Readings from Last 3 Encounters:  03/15/17 156 lb (70.8 kg)  12/28/16 156 lb (70.8 kg)  11/14/16 152 lb (68.9 kg)    Physical Exam  Constitutional: She appears well-developed and well-nourished. No distress.  HENT:  Head: Normocephalic and atraumatic.  Mouth/Throat: Oropharynx is clear and moist. No oropharyngeal exudate.  Cardiovascular: Normal rate, regular rhythm, normal heart sounds and intact distal pulses.   No murmur heard. Pulmonary/Chest: Effort normal and breath sounds normal. No respiratory distress. She has no wheezes. She has no rales.  Musculoskeletal: She exhibits no edema.  Skin: Skin is warm and dry. No rash noted.  Psychiatric: She has a normal mood and affect.  Nursing note and vitals reviewed.  Results for orders placed or performed in  visit on 09/12/50  Basic metabolic panel  Result Value Ref Range   Sodium 139 135 - 145 mEq/L   Potassium 4.4 3.5 - 5.1 mEq/L   Chloride 104 96 - 112 mEq/L   CO2 30 19 - 32 mEq/L   Glucose, Bld 90 70 - 99 mg/dL   BUN 16 6 - 23 mg/dL   Creatinine, Ser 0.83 0.40 - 1.20 mg/dL   Calcium 9.6 8.4 - 10.5 mg/dL   GFR 72.58 >60.00 mL/min  CBC with Differential/Platelet  Result Value Ref Range   WBC 4.9 4.0 - 10.5 K/uL   RBC 4.24 3.87 - 5.11 Mil/uL   Hemoglobin 11.5 (L) 12.0 - 15.0 g/dL   HCT 34.7 (L) 36.0 - 46.0 %   MCV 81.8 78.0 - 100.0 fl   MCHC 33.0 30.0 - 36.0 g/dL   RDW 15.1 11.5 - 15.5 %   Platelets 254.0 150.0 - 400.0 K/uL   Neutrophils Relative % 44.0 43.0 - 77.0 %   Lymphocytes Relative 44.3 12.0 - 46.0 %   Monocytes Relative 6.5 3.0 - 12.0 %   Eosinophils Relative 4.7 0.0 - 5.0 %   Basophils Relative 0.5 0.0 - 3.0 %   Neutro Abs 2.2 1.4 - 7.7 K/uL   Lymphs Abs 2.2 0.7 - 4.0 K/uL   Monocytes Absolute 0.3 0.1 - 1.0 K/uL   Eosinophils Absolute 0.2 0.0 - 0.7 K/uL   Basophils Absolute 0.0 0.0 - 0.1 K/uL  TSH  Result Value Ref Range   TSH 1.30 0.35 - 4.50 uIU/mL  Vitamin B12  Result Value Ref Range   Vitamin B-12 248 211 - 911 pg/mL  VITAMIN D 25 Hydroxy (Vit-D Deficiency, Fractures)  Result Value Ref Range   VITD 42.74 30.00 - 100.00 ng/mL      Assessment & Plan:   Problem List Items Addressed This Visit    B12 deficiency    b12 shot today. Check IF.       Relevant Orders   Intrinsic Factor Antibodies   Vitamin B12   Family circumstance    Continues caring for son who is also my patient. Discussed upcoming move to [facility] to be closer to son.       GERD    Ran out of PPI several months ago.  Anticipate GERD related - will start daily PPI for 3 wks then QOD if tolerated.       Relevant Medications   omeprazole (PRILOSEC) 40 MG capsule   Osteoporosis - Primary    No recent reclast - pt told needed labs first. Has trouble scheduling infusions and would  like to come to office instead.  DEXA 2014 -  will order update.  Pt interested in prolia - discussed mechanism of action and side effects. Will see if we can set patient up for this. Check labs today.  h/o intolerance to oral bisphosphonates (GI upset).       Relevant Orders   Basic metabolic panel   VITAMIN D 25 Hydroxy (Vit-D Deficiency, Fractures)   DG Bone Density   Vitamin D deficiency    Update vit D level.      Relevant Orders   DG Bone Density       Follow up plan: Return in about 4 months (around 07/15/2017) for follow up visit.  [name], MD

## 2017-03-15 NOTE — Assessment & Plan Note (Signed)
Ran out of PPI several months ago.  Anticipate GERD related - will start daily PPI for 3 wks then QOD if tolerated.

## 2017-03-17 LAB — INTRINSIC FACTOR ANTIBODIES: INTRINSIC FACTOR: NEGATIVE

## 2017-03-18 ENCOUNTER — Other Ambulatory Visit: Payer: Self-pay | Admitting: Family Medicine

## 2017-03-18 MED ORDER — VITAMIN B-12 500 MCG PO TABS: 500.0000 ug | ORAL_TABLET | Freq: Every day | ORAL | Status: DC

## 2017-03-20 ENCOUNTER — Telehealth: Payer: Self-pay | Admitting: Neurology

## 2017-03-20 ENCOUNTER — Telehealth: Payer: Self-pay | Admitting: *Deleted

## 2017-03-20 NOTE — Telephone Encounter (Signed)
Caller: Estill Bamberg  Urgent? No  Reason for the call: She would like the number to [facility] for Physical Therapy. Thanks

## 2017-03-20 NOTE — Telephone Encounter (Signed)
-----   Message from [name], MD sent at 03/15/2017 10:42 AM EDT ----- plz check on prolia for patien.t thanks.

## 2017-03-20 NOTE — Telephone Encounter (Signed)
Information has been submitted to pts insurance for verification of benefits. Awaiting response for coverage  

## 2017-03-20 NOTE — Telephone Encounter (Signed)
[phone]  [facility] making patient aware of phone number.

## 2017-03-20 NOTE — Telephone Encounter (Signed)
LMOM letting her know a referral was already sent.

## 2017-03-20 NOTE — Telephone Encounter (Signed)
Caller: PT  Urgent? No  Reason for the call: VM-PT left a message asking if Dr [name] can do a referral to [facility]	[place]

## 2017-03-23 NOTE — Telephone Encounter (Signed)
Verification of benefits have been processed and an approval has been received for pts prolia injection. Pts estimated cost are appx $90. This is only an estimate and cannot be confirmed until benefits are paid. Please advise pt and schedule if needed. If scheduled, once the injection is received, pls contact me back with the date it was received so that I am able to update prolia folder. thanks  [facility] and injection can be scheduled immediately as this is her first injection

## 2017-03-23 NOTE — Telephone Encounter (Signed)
I didn't get to this before I left. Could you please schedule her? thanks
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
## 2017-03-28 ENCOUNTER — Encounter: Payer: Self-pay | Admitting: Neurology

## 2017-03-28 ENCOUNTER — Ambulatory Visit (INDEPENDENT_AMBULATORY_CARE_PROVIDER_SITE_OTHER): Payer: Medicare Other | Admitting: Neurology

## 2017-03-28 VITALS — BP 84/48 | HR 71 | Ht 65.0 in | Wt 158.0 lb

## 2017-03-28 DIAGNOSIS — F331 Major depressive disorder, recurrent, moderate: Secondary | ICD-10-CM

## 2017-03-28 DIAGNOSIS — G903 Multi-system degeneration of the autonomic nervous system: Secondary | ICD-10-CM | POA: Diagnosis not present

## 2017-03-28 DIAGNOSIS — G20 Parkinson's disease: Secondary | ICD-10-CM | POA: Diagnosis not present

## 2017-03-28 NOTE — Patient Instructions (Signed)
I will start you on Northera. I need to send in forms for your insurance and so you get the drug in the mail. Stop the titration of the drug when you no longer feel dizzy. Titrate as follows: Day 1 and 2: 100 mg in the morning, 100 mg in the afternoon, 100 mg in the evening Day 3 and 4: 200 mg in the morning, 200 mg in the afternoon, 200 mg in the evening Day 5 and 6: 300 mg in the morning, 300 mg in the afternoon, 300 mg in the evening Day 7 and 8: 400 mg in the morning, 400 mg in the afternoon, 400 mg in the evening Day 9 and 10: 500 mg in the morning, 500 mg in the afternoon, 500 mg in the evening Day 11 and 14: 600 mg in the morning, 600 mg in the afternoon, 600 mg in the evening

## 2017-03-28 NOTE — Progress Notes (Signed)
Clinical Social Work Note  CSW received request from  Dr. Carles Collet to meet with pt during today's follow up visit. CSW met with pt in exam room. CSW familiar with pt from previous visit. Pt shared that she continues to stay with her son to help with his care following his stroke last July. Pt discussed that she plans to permanently move from [place] to [place] in August. Pt shared that she will be living in the same community as pt son, but this will allow her more time to herself. CSW discussed the importance of self care. CSW provided support as pt discussed interest in getting involved in activities in [place]. CSW discussed the [employer]'s support group and provided flyer. CSW discussed upcoming topics at the [employer]'s group including music therapy and information on [employer]. Pt expressed interest in both of these topics and is going to make an effort to attend. Pt had additional questions about [employer] and CSW discussed information and clarified questions. 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 CSW also provided Lunch & Learn opportunities through [place]. CSW provided emotional counseling as pt discussed that pt son has some days better than others, but later in the day is when pt can usually leave to have time to herself and pt son encourages pt to take time for self care. CSW provided information to pt for the Parkinson's Education Symposium in July and pt is going to make an effort to attend the event. Pt appreciative of CSW time and information on resources. CSW provided pt with CSW contact information and encouraged pt to contact CSW if any further social work needs arise.   Alison Murray, MSW, LCSW Clinical Social Worker Movement [facility] [phone]

## 2017-03-29 ENCOUNTER — Ambulatory Visit: Payer: Medicare Other | Attending: Neurology | Admitting: Rehabilitation

## 2017-03-29 ENCOUNTER — Other Ambulatory Visit: Payer: Self-pay | Admitting: Neurology

## 2017-03-29 ENCOUNTER — Encounter: Payer: Self-pay | Admitting: Rehabilitation

## 2017-03-29 DIAGNOSIS — M6281 Muscle weakness (generalized): Secondary | ICD-10-CM | POA: Diagnosis present

## 2017-03-29 DIAGNOSIS — R460 Very low level of personal hygiene: Secondary | ICD-10-CM | POA: Insufficient documentation

## 2017-03-29 DIAGNOSIS — R2689 Other abnormalities of gait and mobility: Secondary | ICD-10-CM

## 2017-03-29 NOTE — Therapy (Signed)
[place], [place], [address] Phone: [phone]   Fax:  [phone]  Physical Therapy Evaluation  Patient Details  Name: [name] MRN: [id number] Date of Birth: 1946-02-04 Referring Provider: [name]  Encounter Date: 03/29/2017      PT End of Session - 03/29/17 1505    Visit Number 1   Date for PT Re-Evaluation 05/10/17   PT Start Time 1400   PT Stop Time 1445   PT Time Calculation (min) 45 min   Activity Tolerance Patient tolerated treatment well      Past Medical History:  Diagnosis Date  . ANEMIA-NOS 09/25/2007  . B12 DEFICIENCY 05/03/2007  . CAD (coronary artery disease) 01/2010   MI, Nishan  . Cardiomyopathy ([place]) 01/2010  . Cardiomyopathy ([place]) 02/08/2010   H/o this 2012 after urosepsis, no recurrence.   Marland Kitchen FIBROMYALGIA 05/03/2007  . GERD 02/22/2010  . Glaucoma 02/2013   [facility]  . History of CHF (congestive heart failure) 01/2010  . History of colon polyps 2004  . HYPERLIPIDEMIA 12/19/2007  . HYPOTENSION, ORTHOSTATIC 12/06/2008  . Interstitial cystitis    Ottelin now Dr Amalia Hailey  . Lupus (systemic lupus erythematosus) ([place]) 02/08/2010  . MCTD (mixed connective tissue disease) ([place]) 02/08/2010  . OSTEOPOROSIS 08/2009   bisphosphonate on hold 2/2 dysphagia, on reclast done in August each year  . Parkinson's disease ([place]) 08/25/2015   Dx Dr Carles Collet 07/2015   . 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 Takotsubo cardiomyopathy 2008   due to E coli urosepsis    Past Surgical History:  Procedure Laterality Date  . ABDOMINAL HYSTERECTOMY  1970s   IUD infection - first partial then with oophorectomy (cysts), complication - low blood pressure  . CHOLECYSTECTOMY     complication - low blood pressure  . COLONOSCOPY  06/2008   h/o polyps but latest WNL, rec rpt 10 yrs Olevia Perches)  . CYSTOSCOPY  12/2013   abx treatment for recurrent cystitis  . DEXA  04/2013   T  -2.9 @ femur, -1.6 @ spine    There were no vitals filed for this visit.       Subjective Assessment - 03/29/17 1405    Subjective Pt presents with parkinson's with complaints some loss of balance with standing and moving. Overall reports doing well but with some tremor in the R UE and bad tremors at night.  Starting to have some LE stiffness and hip pain with sitting too long.     Pertinent History osteoporosis, OA, back, fibromyalgia, hypotension (84/48 yesterday will be starting new medication)   How long can you walk comfortably? able to walk to the mailbox and back    Patient Stated Goals improve balance, overall preventative strength for the LEs   Currently in Pain? Yes  off and on with fibromyalgia; all location            [facility] PT Assessment - 03/29/17 0001      Assessment   Medical Diagnosis Parkinsons   Referring Provider [name]   Prior Therapy yes     Precautions   Precautions None     Balance Screen   Has the patient fallen in the past 6 months No     [place] residence   Living Arrangements Children   Additional Comments lives with son who had a stroke 1 year ago  Prior Function   Level of Independence Independent   Vocation Retired     Observation/Other Assessments   Focus on Therapeutic Outcomes (FOTO)  26% limited     Posture/Postural Control   Posture Comments postural rigidity, bil tremor R>L intermittent     ROM / Strength   AROM / PROM / Strength Strength     Strength   Overall Strength Comments R quad weakness 3+/5 compared to 4+/5     Ambulation/Gait   Gait Comments pt ambulates without AD with normal to slightly slow gait velocity, lack of arm swing, and decreased step ht bilaterally     Standardized Balance Assessment   Standardized Balance Assessment Berg Balance Test;Dynamic Gait Index;Timed Up and Go Test     Berg Balance Test   Sit to Stand Able to stand without using hands and  stabilize independently   Standing Unsupported Able to stand safely 2 minutes   Sitting with Back Unsupported but Feet Supported on Floor or Stool Able to sit safely and securely 2 minutes   Stand to Sit Sits safely with minimal use of hands   Transfers Able to transfer safely, minor use of hands   Standing Unsupported with Eyes Closed Able to stand 3 seconds   Standing Ubsupported with Feet Together Able to place feet together independently and stand for 1 minute with supervision   From Standing, Reach Forward with Outstretched Arm Can reach forward >12 cm safely (5")  LOB forward   From Standing Position, Pick up Object from Floor Able to pick up shoe safely and easily   From Standing Position, Turn to Look Behind Over each Shoulder Looks behind one side only/other side shows less weight shift   Turn 360 Degrees Able to turn 360 degrees safely one side only in 4 seconds or less  5sec   Standing Unsupported, Alternately Place Feet on Step/Stool Able to stand independently and complete 8 steps >20 seconds   Standing Unsupported, One Foot in [employer] balance while stepping or standing   Standing on One Leg Tries to lift leg/unable to hold 3 seconds but remains standing independently   Total Score 42   Berg comment: significant fall risk     Dynamic Gait Index   Level Surface Mild Impairment   Change in Gait Speed Normal   Gait with Horizontal Head Turns Normal   Gait with Vertical Head Turns Severe Impairment   Gait and Pivot Turn Mild Impairment   Step Over Obstacle Mild Impairment   Step Around Obstacles Normal   Steps Normal   Total Score 18   DGI comment: predictive of falls     Timed Up and Go Test   TUG Comments 15"                           PT Education - 03/29/17 1505    Education provided Yes   Education Details POC   Person(s) Educated Patient   Methods Explanation   Comprehension Verbalized understanding             PT Long Term Goals  - 03/29/17 1510      PT LONG TERM GOAL #1   Title Pt will improve TUG to 13.5 seconds to decrease fall risk   Time 6   Period Weeks   Status New     PT LONG TERM GOAL #2   Title Pt will improve Berg balance score to 49 or greater to  decrease fall risk   Time 6   Period Weeks   Status New     PT LONG TERM GOAL #3   Title Pt will be independent for home exercises   Time 6   Period Weeks   Status New     PT LONG TERM GOAL #4   Title pt will improve R quad strength to 4/5 or greater   Time 6   Period Weeks   Status New               Plan - 03/29/17 1506    Clinical Impression Statement Pt presents with 5 year onset of Parkinson's disease which has improved greatly with Leva/Caribidopa treatments.  No significant functional limitations present per patient but upon balance and strength testing pt demonstrates a high fall risk on the Berg, DGI, and TUG assessments.  Weakness in the R quad is also present limiting SL stance on the R.  Pt notes that she broke the leg along time ago but never knew it was weak until today.  Balance interrupted with movement out of the BOS by the trunk, with head movements, and with a narrowed BOS.    Rehab Potential Excellent   Clinical Impairments Affecting Rehab Potential having to care for son that had a stroke   PT Frequency 2x / week   PT Duration 6 weeks   PT Treatment/Interventions ADLs/Self Care Home Management;Therapeutic activities;Therapeutic exercise;Balance training;Neuromuscular re-education   PT Next Visit Plan Gait and balance, Parkinson's TE, LE strength/R quad   Consulted and Agree with Plan of Care Patient      Patient will benefit from skilled therapeutic intervention in order to improve the following deficits and impairments:  Decreased balance, Abnormal gait  Visit Diagnosis: Other abnormalities of gait and mobility - Plan: PT plan of care cert/re-cert  Muscle weakness (generalized) - Plan: PT plan of care  cert/re-cert      G-Codes - 16/10/96 1512    Functional Assessment Tool Used (Outpatient Only) FOTO 26%   Functional Limitation Mobility: Walking and moving around   Mobility: Walking and Moving Around Current Status ([id number]) At least 20 percent but less than 40 percent impaired, limited or restricted   Mobility: Walking and Moving Around Goal Status [phone]) At least 1 percent but less than 20 percent impaired, limited or restricted       Problem List Patient Active Problem List   Diagnosis Date Noted  . Right foot pain 04/28/2016  . Skin rash 04/28/2016  . GAD (generalized anxiety disorder) 04/28/2016  . Chronic insomnia 03/07/2016  . Left Achilles tendinitis 12/08/2015  . Parkinson's disease ([place]) 08/25/2015  . Advanced care planning/counseling discussion 05/26/2015  . Family circumstance 02/23/2015  . Health maintenance examination 05/21/2014  . MDD (major depressive disorder), single episode, moderate ([place]) 11/12/2013  . Dysuria 09/02/2013  . Back pain 05/31/2013  . Medicare annual wellness visit, subsequent 05/20/2013  . HLD (hyperlipidemia) 05/20/2013  . Vitamin D deficiency 05/09/2013  . 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 Recurrent UTI 01/03/2011  . Syncope 01/03/2011  . RASH-NONVESICULAR 05/11/2010  . GERD 02/22/2010  . CAD (coronary artery disease) 01/25/2010  . Osteoporosis 11/10/2009  . HYPOTENSION, ORTHOSTATIC 12/06/2008  . Anemia 09/25/2007  . B12 deficiency 05/03/2007  . Fibromyalgia 05/03/2007    Stark Bray 03/29/2017, 3:15 PM  [place] [address] [place], [place], [address] Phone: [phone]   Fax:  [phone]  Name: [name]  [name] MRN: [id number] Date of Birth: 1946-09-23

## 2017-03-30 ENCOUNTER — Other Ambulatory Visit (INDEPENDENT_AMBULATORY_CARE_PROVIDER_SITE_OTHER): Payer: Medicare Other

## 2017-03-30 DIAGNOSIS — M810 Age-related osteoporosis without current pathological fracture: Secondary | ICD-10-CM

## 2017-03-30 LAB — CALCIUM: Calcium: 10.2 mg/dL (ref 8.4–10.5)

## 2017-03-30 NOTE — Telephone Encounter (Signed)
Spoke to pt and scheduled Ca lab 

## 2017-04-02 NOTE — Telephone Encounter (Signed)
Left message for [name] to call back and schedule a nurse visit for her Prolia injection. She has had 2 normal calcium levels with in the last month.

## 2017-04-03 ENCOUNTER — Telehealth: Payer: Self-pay | Admitting: Neurology

## 2017-04-03 ENCOUNTER — Ambulatory Visit: Payer: Medicare Other | Admitting: Physical Therapy

## 2017-04-03 DIAGNOSIS — R2689 Other abnormalities of gait and mobility: Secondary | ICD-10-CM | POA: Diagnosis not present

## 2017-04-03 DIAGNOSIS — M6281 Muscle weakness (generalized): Secondary | ICD-10-CM

## 2017-04-03 NOTE — Telephone Encounter (Signed)
VM-Message left from [facility](spelling) stating that the benefits report was ready for PT/Dawn CB# [phone]

## 2017-04-03 NOTE — Telephone Encounter (Signed)
Working on prior authorization.

## 2017-04-03 NOTE — Therapy (Signed)
[facility], [place], [address] Phone: [phone]   Fax:  [phone]  Physical Therapy Treatment  Patient Details  Name: [name] MRN: [id number] Date of Birth: 12/02/1945 Referring Provider: [name]  Encounter Date: 04/03/2017      PT End of Session - 04/03/17 1104    Visit Number 2   Date for PT Re-Evaluation 05/10/17   PT Start Time 1055   PT Stop Time 1141   PT Time Calculation (min) 46 min   Activity Tolerance Patient tolerated treatment well      Past Medical History:  Diagnosis Date  . ANEMIA-NOS 09/25/2007  . B12 DEFICIENCY 05/03/2007  . CAD (coronary artery disease) 01/2010   MI, Nishan  . Cardiomyopathy ([place]) 01/2010  . Cardiomyopathy ([place]) 02/08/2010   H/o this 2012 after urosepsis, no recurrence.   Marland Kitchen FIBROMYALGIA 05/03/2007  . GERD 02/22/2010  . Glaucoma 02/2013   [facility]  . History of CHF (congestive heart failure) 01/2010  . History of colon polyps 2004  . HYPERLIPIDEMIA 12/19/2007  . HYPOTENSION, ORTHOSTATIC 12/06/2008  . Interstitial cystitis    [place]tt[place] now Dr Amalia Hailey  . Lupus (systemic lupus erythematosus) ([place]) 02/08/2010  . MCTD (mixed connective tissue disease) ([place]) 02/08/2010  . OSTEOPOROSIS 08/2009   bisphosphonate on hold 2/2 dysphagia, on reclast done in August each year  . Parkinson's disease ([place]) 08/25/2015   Dx Dr Carles Collet 07/2015   . 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 Takotsubo cardiomyopathy 2008   due to E coli urosepsis    Past Surgical History:  Procedure Laterality Date  . ABDOMINAL HYSTERECTOMY  1970s   IUD infection - first partial then with oophorectomy (cysts), complication - low blood pressure  . CHOLECYSTECTOMY     complication - low blood pressure  . COLONOSCOPY  06/2008   h/o polyps but latest WNL, rec rpt 10 yrs Olevia Perches)  . CYSTOSCOPY  12/2013   abx treatment for recurrent cystitis  . DEXA  04/2013   T  -2.9 @ femur, -1.6 @ spine    There were no vitals filed for this visit.      Subjective Assessment - 04/03/17 1055    Subjective Pt. reports her BP has been a little low today however denies dizziness or light headedness.     Patient Stated Goals improve balance, overall preventative strength for the LEs   Currently in Pain? No/denies   Multiple Pain Sites No                         [facility] Adult PT Treatment/Exercise - 04/03/17 1119      Ambulation/Gait   Ambulation/Gait Yes   Ambulation/Gait Assistance 6: Modified independent (Device/Increase time)   Ambulation Distance (Feet) 100 Feet   Gait Pattern Decreased step length - right;Decreased step length - left;Decreased hip/knee flexion - right;Decreased hip/knee flexion - left   Gait Comments Working on vertical head turns up/down with supervision occasional CGA      Neuro Re-ed    Neuro Re-ed Details  Alternating step over forward, lateral black bolster 4 black bolster x 5 laps; 1 HH support; tandem stance with horizontal and vertical head turns x 1 min each way      Exercises   Exercises Knee/Hip     Knee/Hip Exercises: Aerobic   Nustep Lvl 5, 6 min  Knee/Hip Exercises: Machines for Strengthening   Cybex Knee Extension BATCA 10# 2 x 15 reps   Cybex Knee Flexion BATCA HS curl 15# 2 x 15 reps     Knee/Hip Exercises: Standing   Other Standing Knee Exercises Alternating toe touch to 8" step without UE support x 10 reps    Other Standing Knee Exercises Alternating cross-over reach to white bolster and lunge onto BOSU ball (up) 2 x 15 reps each way; progressively further reach      Knee/Hip Exercises: Seated   Long Arc Quad Right;Strengthening;2 sets;20 reps   Long Arc Quad Weight 3 lbs.   Long [employer] Limitations with adduction ball squeeze    Sit to [employer] 15 reps;without UE support;3 sets  with B horizontal shoulder abduction                      PT Long Term Goals - 03/29/17 1510       PT LONG TERM GOAL #1   Title Pt will improve TUG to 13.5 seconds to decrease fall risk   Time 6   Period Weeks   Status New     PT LONG TERM GOAL #2   Title Pt will improve Berg balance score to 49 or greater to decrease fall risk   Time 6   Period Weeks   Status New     PT LONG TERM GOAL #3   Title Pt will be independent for home exercises   Time 6   Period Weeks   Status New     PT LONG TERM GOAL #4   Title pt will improve R quad strength to 4/5 or greater   Time 6   Period Weeks   Status New               Plan - 04/03/17 1105    Clinical Impression Statement Pt. performed well with balance, strengthening, and gait training today.  Balance training focusing on stepping and LE clearance activities and gait training with vertical head turns.  Pt. with frequent LOB with balance and gait with vertical head turns however able to self-correct with light CGA from therapist.  Pt. very compliant and seems motivated at this point.  Pt. will continue to benefit from further skilled therapy to improve balance and safety with functional activity and gait.   PT Treatment/Interventions ADLs/Self Care Home Management;Therapeutic activities;Therapeutic exercise;Balance training;Neuromuscular re-education   PT Next Visit Plan Gait and balance, Parkinson's TE, LE strength/R quad      Patient will benefit from skilled therapeutic intervention in order to improve the following deficits and impairments:  Decreased balance, Abnormal gait  Visit Diagnosis: Other abnormalities of gait and mobility  Muscle weakness (generalized)     Problem List Patient Active Problem List   Diagnosis Date Noted  . Right foot pain 04/28/2016  . Skin rash 04/28/2016  . GAD (generalized anxiety disorder) 04/28/2016  . Chronic insomnia 03/07/2016  . Left Achilles tendinitis 12/08/2015  . Parkinson's disease ([place]) 08/25/2015  . Advanced care planning/counseling discussion 05/26/2015  . Family  circumstance 02/23/2015  . Health maintenance examination 05/21/2014  . MDD (major depressive disorder), single episode, moderate ([place]) 11/12/2013  . Dysuria 09/02/2013  . Back pain 05/31/2013  . Medicare annual wellness visit, subsequent 05/20/2013  . HLD (hyperlipidemia) 05/20/2013  . Vitamin D deficiency 05/09/2013  . 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 Recurrent UTI 01/03/2011  . Syncope 01/03/2011  . RASH-NONVESICULAR 05/11/2010  . GERD 02/22/2010  .  CAD (coronary artery disease) 01/25/2010  . Osteoporosis 11/10/2009  . HYPOTENSION, ORTHOSTATIC 12/06/2008  . Anemia 09/25/2007  . B12 deficiency 05/03/2007  . Fibromyalgia 05/03/2007    Bess Harvest, PTA 04/03/17 1:01 PM  [place], [place], [address] Phone: [phone]   Fax:  [phone]  Name: [name] MRN: [id number] Date of Birth: 05-15-1946

## 2017-04-05 ENCOUNTER — Encounter: Payer: Self-pay | Admitting: Neurology

## 2017-04-05 ENCOUNTER — Telehealth: Payer: Self-pay | Admitting: Neurology

## 2017-04-05 ENCOUNTER — Ambulatory Visit: Payer: Medicare Other | Admitting: Physical Therapy

## 2017-04-05 NOTE — Telephone Encounter (Signed)
Ebony from [employer] [phone] ext [phone] would like to speak with someone about Auth for a medication for the patient please call her

## 2017-04-05 NOTE — Telephone Encounter (Signed)
Wanted to let me know prior [name] was required. Let her know we are aware and working on it.

## 2017-04-05 NOTE — Telephone Encounter (Signed)
Pt scheduled to receive injection 5/16

## 2017-04-09 ENCOUNTER — Encounter: Payer: Self-pay | Admitting: Physical Therapy

## 2017-04-09 ENCOUNTER — Ambulatory Visit: Payer: Medicare Other | Admitting: Physical Therapy

## 2017-04-09 DIAGNOSIS — R2689 Other abnormalities of gait and mobility: Secondary | ICD-10-CM

## 2017-04-09 DIAGNOSIS — R460 Very low level of personal hygiene: Secondary | ICD-10-CM

## 2017-04-09 DIAGNOSIS — M6281 Muscle weakness (generalized): Secondary | ICD-10-CM

## 2017-04-09 DIAGNOSIS — Z741 Need for assistance with personal care: Secondary | ICD-10-CM

## 2017-04-09 NOTE — Therapy (Signed)
[place], [place], [address] Phone: [phone]   Fax:  [phone]  Physical Therapy Treatment  Patient Details  Name: [name] MRN: [id number] Date of Birth: 07-29-1946 Referring Provider: [name]  Encounter Date: 04/09/2017      PT End of Session - 04/09/17 1100    Visit Number 3   Date for PT Re-Evaluation 05/10/17   PT Start Time 1021   PT Stop Time 1100   PT Time Calculation (min) 39 min   Activity Tolerance Patient tolerated treatment well   Behavior During Therapy [facility] for tasks assessed/performed      Past Medical History:  Diagnosis Date  . ANEMIA-NOS 09/25/2007  . B12 DEFICIENCY 05/03/2007  . CAD (coronary artery disease) 01/2010   MI, Nishan  . Cardiomyopathy ([place]) 01/2010  . Cardiomyopathy ([place]) 02/08/2010   H/o this 2012 after urosepsis, no recurrence.   Marland Kitchen FIBROMYALGIA 05/03/2007  . GERD 02/22/2010  . Glaucoma 02/2013   [facility]  . History of CHF (congestive heart failure) 01/2010  . History of colon polyps 2004  . HYPERLIPIDEMIA 12/19/2007  . HYPOTENSION, ORTHOSTATIC 12/06/2008  . Interstitial cystitis    Ott[place] now Dr Amalia Hailey  . Lupus (systemic lupus erythematosus) ([place]) 02/08/2010  . MCTD (mixed connective tissue disease) ([place]) 02/08/2010  . OSTEOPOROSIS 08/2009   bisphosphonate on hold 2/2 dysphagia, on reclast done in August each year  . Parkinson's disease ([place]) 08/25/2015   Dx Dr Carles Collet 07/2015   . 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 Takotsubo cardiomyopathy 2008   due to E coli urosepsis    Past Surgical History:  Procedure Laterality Date  . ABDOMINAL HYSTERECTOMY  1970s   IUD infection - first partial then with oophorectomy (cysts), complication - low blood pressure  . CHOLECYSTECTOMY     complication - low blood pressure  . COLONOSCOPY  06/2008   h/o polyps but latest WNL, rec rpt 10 yrs Olevia Perches)  . CYSTOSCOPY  12/2013   abx treatment for recurrent cystitis  . DEXA  04/2013   T -2.9 @ femur, -1.6 @ spine    There were no vitals filed for this visit.      Subjective Assessment - 04/09/17 1024    Subjective Pt reports that she is fine, she stated that she has some stumbles but was able to catch herself   Currently in Pain? Yes   Pain Score 2    Pain Location Hip   Pain Orientation Right;Left                         [facility] Adult PT Treatment/Exercise - 04/09/17 0001      High Level Balance   High Level Balance Activities Side stepping   High Level Balance Comments standiing ball toss     Exercises   Exercises Lumbar     Lumbar Exercises: Machines for Strengthening   Cybex Knee Extension BATCA 5lb  2x10 reps   Cybex Knee Flexion BATCA HS curl 20lb 2x10 reps     Lumbar Exercises: Standing   Row Both;15 reps;Theraband  x2   Shoulder Extension Both;15 reps  x2   Theraband Level (Shoulder Extension) Level 2 (Red)     Knee/Hip Exercises: Aerobic   Nustep Lvl 4, 5 min    Other Aerobic UBE L1 80frd/2rev     Knee/Hip Exercises: Standing   Other  Standing Knee Exercises Alternating toe touch to 8" step without UE support 2x10 reps      Knee/Hip Exercises: Seated   Sit to Sand 3 sets;5 reps;without UE support                     PT Long Term Goals - 03/29/17 1510      PT LONG TERM GOAL #1   Title Pt will improve TUG to 13.5 seconds to decrease fall risk   Time 6   Period Weeks   Status New     PT LONG TERM GOAL #2   Title Pt will improve Berg balance score to 49 or greater to decrease fall risk   Time 6   Period Weeks   Status New     PT LONG TERM GOAL #3   Title Pt will be independent for home exercises   Time 6   Period Weeks   Status New     PT LONG TERM GOAL #4   Title pt will improve R quad strength to 4/5 or greater   Time 6   Period Weeks   Status New               Plan - 04/09/17 1105    Clinical Impression Statement Pt.  performed all of today's interventions well, does have some instability with standing march. On machine level interventions she is able to achieve greater LE ROM with LLE compared to R. Pt has some difficulty with the higher ball toss.    Rehab Potential Excellent   Clinical Impairments Affecting Rehab Potential having to care for son that had a stroke   PT Frequency 2x / week   PT Duration 6 weeks   PT Treatment/Interventions ADLs/Self Care Home Management;Therapeutic activities;Therapeutic exercise;Balance training;Neuromuscular re-education   PT Next Visit Plan Gait and balance, Parkinson's TE, LE strength/R quad      Patient will benefit from skilled therapeutic intervention in order to improve the following deficits and impairments:  Decreased balance, Abnormal gait  Visit Diagnosis: Other abnormalities of gait and mobility  Muscle weakness (generalized)  Self-care deficit for bathing and hygiene     Problem List Patient Active Problem List   Diagnosis Date Noted  . Right foot pain 04/28/2016  . Skin rash 04/28/2016  . GAD (generalized anxiety disorder) 04/28/2016  . Chronic insomnia 03/07/2016  . Left Achilles tendinitis 12/08/2015  . Parkinson's disease ([place]) 08/25/2015  . Advanced care planning/counseling discussion 05/26/2015  . Family circumstance 02/23/2015  . Health maintenance examination 05/21/2014  . MDD (major depressive disorder), single episode, moderate ([place]) 11/12/2013  . Dysuria 09/02/2013  . Back pain 05/31/2013  . Medicare annual wellness visit, subsequent 05/20/2013  . HLD (hyperlipidemia) 05/20/2013  . Vitamin D deficiency 05/09/2013  . 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 Recurrent UTI 01/03/2011  . Syncope 01/03/2011  . RASH-NONVESICULAR 05/11/2010  . GERD 02/22/2010  . CAD (coronary artery disease) 01/25/2010  . Osteoporosis 11/10/2009  . HYPOTENSION, ORTHOSTATIC 12/06/2008  . Anemia 09/25/2007  . B12 deficiency 05/03/2007  .  Fibromyalgia 05/03/2007    Scot Jun, PTA 04/09/2017, 11:07 AM  [place], [place], [address] Phone: [phone]   Fax:  [phone]  Name: [name] MRN: [id number] Date of Birth: 1945-12-30

## 2017-04-11 ENCOUNTER — Telehealth: Payer: Self-pay | Admitting: Neurology

## 2017-04-11 ENCOUNTER — Ambulatory Visit (INDEPENDENT_AMBULATORY_CARE_PROVIDER_SITE_OTHER): Payer: Medicare Other

## 2017-04-11 DIAGNOSIS — M818 Other osteoporosis without current pathological fracture: Secondary | ICD-10-CM

## 2017-04-11 MED ORDER — DENOSUMAB 60 MG/ML ~~LOC~~ SOLN
60.0000 mg | Freq: Once | SUBCUTANEOUS | Status: AC
  Administered 2017-04-11: 60 mg via SUBCUTANEOUS

## 2017-04-11 NOTE — Telephone Encounter (Signed)
[employer] covered, but this is the copay and patient can not afford. Please advise.

## 2017-04-11 NOTE — Telephone Encounter (Signed)
PT called and left a message to let Dr [name] know she can't afford to get the blood pressure prescription because it is a 100.00 to get it

## 2017-04-11 NOTE — Telephone Encounter (Signed)
Florinef - 0.1 mg q AM.  Have her take BP in day.  Raise up [facility] with bricks

## 2017-04-12 ENCOUNTER — Encounter: Payer: Self-pay | Admitting: Physical Therapy

## 2017-04-12 ENCOUNTER — Ambulatory Visit: Payer: Medicare Other | Admitting: Physical Therapy

## 2017-04-12 DIAGNOSIS — M6281 Muscle weakness (generalized): Secondary | ICD-10-CM

## 2017-04-12 DIAGNOSIS — R2689 Other abnormalities of gait and mobility: Secondary | ICD-10-CM

## 2017-04-12 NOTE — Telephone Encounter (Signed)
Spoke with patient. She states she is looking into their foundations for copay assistance. She will let me know if it doesn't work out and we will start Florinef at that time.

## 2017-04-12 NOTE — Therapy (Signed)
[place], [place], [address] Phone: [phone]   Fax:  [phone]  Physical Therapy Treatment  Patient Details  Name: [name] MRN: [id number] Date of Birth: 02/04/1946 Referring Provider: [name]  Encounter Date: 04/12/2017      PT End of Session - 04/12/17 1228    Visit Number 4   Date for PT Re-Evaluation 05/10/17   PT Start Time 1093   PT Stop Time 1229   PT Time Calculation (min) 44 min   Activity Tolerance Patient tolerated treatment well   Behavior During Therapy [facility] for tasks assessed/performed      Past Medical History:  Diagnosis Date  . ANEMIA-NOS 09/25/2007  . B12 DEFICIENCY 05/03/2007  . CAD (coronary artery disease) 01/2010   MI, Nishan  . Cardiomyopathy ([place]) 01/2010  . Cardiomyopathy ([place]) 02/08/2010   H/o this 2012 after urosepsis, no recurrence.   Marland Kitchen FIBROMYALGIA 05/03/2007  . GERD 02/22/2010  . Glaucoma 02/2013   [facility]  . History of CHF (congestive heart failure) 01/2010  . History of colon polyps 2004  . HYPERLIPIDEMIA 12/19/2007  . HYPOTENSION, ORTHOSTATIC 12/06/2008  . Interstitial cystitis    Ottelin now Dr Amalia Hailey  . Lupus (systemic lupus erythematosus) ([place]) 02/08/2010  . MCTD (mixed connective tissue disease) ([place]) 02/08/2010  . OSTEOPOROSIS 08/2009   bisphosphonate on hold 2/2 dysphagia, on reclast done in August each year  . Parkinson's disease ([place]) 08/25/2015   Dx Dr Carles Collet 07/2015   . 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 Takotsubo cardiomyopathy 2008   due to E coli urosepsis    Past Surgical History:  Procedure Laterality Date  . ABDOMINAL HYSTERECTOMY  1970s   IUD infection - first partial then with oophorectomy (cysts), complication - low blood pressure  . CHOLECYSTECTOMY     complication - low blood pressure  . COLONOSCOPY  06/2008   h/o polyps but latest WNL, rec rpt 10 yrs Olevia Perches)  . CYSTOSCOPY  12/2013   abx treatment for recurrent cystitis  . DEXA  04/2013   T -2.9 @ femur, -1.6 @ spine    There were no vitals filed for this visit.      Subjective Assessment - 04/12/17 1144    Subjective Pt reports some sorness from last treatment, Pt stated thst her RLE is hurting today, she said that was normal with atmospheric changes    Currently in Pain? Yes   Pain Score 5    Pain Location Foot   Pain Orientation Right                         [facility] Adult PT Treatment/Exercise - 04/12/17 0001      High Level Balance   High Level Balance Activities Side stepping;Backward walking     Lumbar Exercises: Standing   Row Both;15 reps;Theraband  x2   Theraband Level (Row) Level 2 (Red)   Shoulder Extension Both;15 reps  x2   Theraband Level (Shoulder Extension) Level 2 (Red)     Knee/Hip Exercises: Aerobic   Nustep Lvl 4, 5 min    Other Aerobic UBE L1 55frd/2rev     Knee/Hip Exercises: Machines for Strengthening   Cybex Knee Extension BATCA 5lb  2x10 reps   Cybex Knee Flexion BATCA HS curl 20lb 2x10 reps     Knee/Hip Exercises: Standing   Forward Step Up 2 sets;10  reps;Hand Hold: 0;Step Height: 6"                     PT Long Term Goals - 04/12/17 1231      PT LONG TERM GOAL #1   Title Pt will improve TUG to 13.5 seconds to decrease fall risk   Status On-going     PT LONG TERM GOAL #2   Title Pt will improve Berg balance score to 49 or greater to decrease fall risk   Status On-going     PT LONG TERM GOAL #3   Title Pt will be independent for home exercises   Status On-going     PT LONG TERM GOAL #4   Title pt will improve R quad strength to 4/5 or greater   Status On-going               Plan - 04/12/17 1229    Clinical Impression Statement no issues with todays activities, tolerated more machine level interventions as well as fuctional exercises.   Rehab Potential Excellent   Clinical Impairments Affecting Rehab Potential having to care  for son that had a stroke   PT Frequency 2x / week   PT Duration 6 weeks   PT Treatment/Interventions ADLs/Self Care Home Management;Therapeutic activities;Therapeutic exercise;Balance training;Neuromuscular re-education   PT Next Visit Plan Gait and balance, Parkinson's TE, LE strength/R quad      Patient will benefit from skilled therapeutic intervention in order to improve the following deficits and impairments:  Decreased balance, Abnormal gait  Visit Diagnosis: Other abnormalities of gait and mobility  Muscle weakness (generalized)     Problem List Patient Active Problem List   Diagnosis Date Noted  . Right foot pain 04/28/2016  . Skin rash 04/28/2016  . GAD (generalized anxiety disorder) 04/28/2016  . Chronic insomnia 03/07/2016  . Left Achilles tendinitis 12/08/2015  . Parkinson's disease ([place]) 08/25/2015  . Advanced care planning/counseling discussion 05/26/2015  . Family circumstance 02/23/2015  . Health maintenance examination 05/21/2014  . MDD (major depressive disorder), single episode, moderate ([place]) 11/12/2013  . Dysuria 09/02/2013  . Back pain 05/31/2013  . Medicare annual wellness visit, subsequent 05/20/2013  . HLD (hyperlipidemia) 05/20/2013  . Vitamin D deficiency 05/09/2013  . 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 Recurrent UTI 01/03/2011  . Syncope 01/03/2011  . RASH-NONVESICULAR 05/11/2010  . GERD 02/22/2010  . CAD (coronary artery disease) 01/25/2010  . Osteoporosis 11/10/2009  . HYPOTENSION, ORTHOSTATIC 12/06/2008  . Anemia 09/25/2007  . B12 deficiency 05/03/2007  . Fibromyalgia 05/03/2007    Scot Jun, PTA 04/12/2017, 12:31 PM  [place], [place], [address] Phone: [phone]   Fax:  [phone]  Name: CALIANA SPIRES MRN: [id number] Date of Birth: 1946/09/13

## 2017-04-13 ENCOUNTER — Telehealth: Payer: Self-pay | Admitting: Neurology

## 2017-04-13 NOTE — Telephone Encounter (Signed)
FYI.  She got help with copayment for Northera so she is starting medication.

## 2017-04-13 NOTE — Telephone Encounter (Signed)
Caller: PT  Urgent? No  Reason for the call: PT wanted to let Dr [name] know she got help with her BP medicine and said it was approved and they will be sending it out

## 2017-04-17 ENCOUNTER — Encounter: Payer: Self-pay | Admitting: Physical Therapy

## 2017-04-17 ENCOUNTER — Ambulatory Visit: Payer: Medicare Other | Admitting: Physical Therapy

## 2017-04-17 DIAGNOSIS — R2689 Other abnormalities of gait and mobility: Secondary | ICD-10-CM | POA: Diagnosis not present

## 2017-04-17 DIAGNOSIS — M6281 Muscle weakness (generalized): Secondary | ICD-10-CM

## 2017-04-17 NOTE — Therapy (Signed)
[place], [place], [address] Phone: [phone]   Fax:  [phone]  Physical Therapy Treatment  Patient Details  Name: [name] MRN: [id number] Date of Birth: Mar 12, 1946 Referring Provider: [name]  Encounter Date: 04/17/2017      PT End of Session - 04/17/17 1139    Date for PT Re-Evaluation 05/10/17   PT Start Time 1100   PT Stop Time 1141   PT Time Calculation (min) 41 min   Activity Tolerance Patient tolerated treatment well   Behavior During Therapy [facility] for tasks assessed/performed      Past Medical History:  Diagnosis Date  . ANEMIA-NOS 09/25/2007  . B12 DEFICIENCY 05/03/2007  . CAD (coronary artery disease) 01/2010   MI, Nishan  . Cardiomyopathy ([place]) 01/2010  . Cardiomyopathy ([place]) 02/08/2010   H/o this 2012 after urosepsis, no recurrence.   Marland Kitchen FIBROMYALGIA 05/03/2007  . GERD 02/22/2010  . Glaucoma 02/2013   [facility]  . History of CHF (congestive heart failure) 01/2010  . History of colon polyps 2004  . HYPERLIPIDEMIA 12/19/2007  . HYPOTENSION, ORTHOSTATIC 12/06/2008  . Interstitial cystitis    Ott[place] now Dr Amalia Hailey  . Lupus (systemic lupus erythematosus) ([place]) 02/08/2010  . MCTD (mixed connective tissue disease) ([place]) 02/08/2010  . OSTEOPOROSIS 08/2009   bisphosphonate on hold 2/2 dysphagia, on reclast done in August each year  . Parkinson's disease ([place]) 08/25/2015   Dx Dr Carles Collet 07/2015   . 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 Takotsubo cardiomyopathy 2008   due to E coli urosepsis    Past Surgical History:  Procedure Laterality Date  . ABDOMINAL HYSTERECTOMY  1970s   IUD infection - first partial then with oophorectomy (cysts), complication - low blood pressure  . CHOLECYSTECTOMY     complication - low blood pressure  . COLONOSCOPY  06/2008   h/o polyps but latest WNL, rec rpt 10 yrs Olevia Perches)  . CYSTOSCOPY  12/2013   abx treatment for  recurrent cystitis  . DEXA  04/2013   T -2.9 @ femur, -1.6 @ spine    There were no vitals filed for this visit.      Subjective Assessment - 04/17/17 1102    Subjective Pt reports that she is stiff and hurting all over today.   Currently in Pain? Yes   Pain Score 6    Pain Location --  all over                         [facility] Adult PT Treatment/Exercise - 04/17/17 0001      High Level Balance   High Level Balance Activities Side stepping;Backward walking;Marching forwards     Lumbar Exercises: Standing   Row Both;15 reps;Theraband  x2   Theraband Level (Row) Level 2 (Red)   Shoulder Extension Both;15 reps;Theraband  x2   Theraband Level (Shoulder Extension) Level 2 (Red)     Knee/Hip Exercises: Aerobic   Nustep Lvl 4, 6 min      Knee/Hip Exercises: Machines for Strengthening   Cybex Knee Extension 5lb2x10    Cybex Knee Flexion 25lb 2x10   Cybex Leg Press 20lb 2x10      Knee/Hip Exercises: Seated   Sit to Sand without UE support;2 sets;10 reps                     PT  Long Term Goals - 04/12/17 1231      PT LONG TERM GOAL #1   Title Pt will improve TUG to 13.5 seconds to decrease fall risk   Status On-going     PT LONG TERM GOAL #2   Title Pt will improve Berg balance score to 49 or greater to decrease fall risk   Status On-going     PT LONG TERM GOAL #3   Title Pt will be independent for home exercises   Status On-going     PT LONG TERM GOAL #4   Title pt will improve R quad strength to 4/5 or greater   Status On-going               Plan - 04/17/17 1139    Clinical Impression Statement pt enters clinic reports increase stiffness and pain all over. Despite complaints pt able to complete all of today's exercises. She does report some quad burning with sit to stands. Pt did report that she felt like her BP was getting low and needed to have a seat while doing Tband Rows. BP was checked 110/70. Pt was able to continue  treatment. Pt reports some fear with the balance interventions more so with standing march.    Rehab Potential Excellent   Clinical Impairments Affecting Rehab Potential having to care for son that had a stroke   PT Frequency 2x / week   PT Duration 6 weeks   PT Treatment/Interventions ADLs/Self Care Home Management;Therapeutic activities;Therapeutic exercise;Balance training;Neuromuscular re-education   PT Next Visit Plan Gait and balance, Parkinson's TE, LE strength/R quad      Patient will benefit from skilled therapeutic intervention in order to improve the following deficits and impairments:  Decreased balance, Abnormal gait  Visit Diagnosis: Other abnormalities of gait and mobility  Muscle weakness (generalized)     Problem List Patient Active Problem List   Diagnosis Date Noted  . Right foot pain 04/28/2016  . Skin rash 04/28/2016  . GAD (generalized anxiety disorder) 04/28/2016  . Chronic insomnia 03/07/2016  . Left Achilles tendinitis 12/08/2015  . Parkinson's disease ([place]) 08/25/2015  . Advanced care planning/counseling discussion 05/26/2015  . Family circumstance 02/23/2015  . Health maintenance examination 05/21/2014  . MDD (major depressive disorder), single episode, moderate ([place]) 11/12/2013  . Dysuria 09/02/2013  . Back pain 05/31/2013  . Medicare annual wellness visit, subsequent 05/20/2013  . HLD (hyperlipidemia) 05/20/2013  . Vitamin D deficiency 05/09/2013  . 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 Recurrent UTI 01/03/2011  . Syncope 01/03/2011  . RASH-NONVESICULAR 05/11/2010  . GERD 02/22/2010  . CAD (coronary artery disease) 01/25/2010  . Osteoporosis 11/10/2009  . HYPOTENSION, ORTHOSTATIC 12/06/2008  . Anemia 09/25/2007  . B12 deficiency 05/03/2007  . Fibromyalgia 05/03/2007    Scot Jun, PTA 04/17/2017, 11:42 AM  [place], [place],  [address] Phone: [phone]   Fax:  [phone]  Name: [name] MRN: [id number] Date of Birth: January 29, 1946

## 2017-04-18 ENCOUNTER — Ambulatory Visit (INDEPENDENT_AMBULATORY_CARE_PROVIDER_SITE_OTHER): Payer: Medicare Other | Admitting: *Deleted

## 2017-04-18 DIAGNOSIS — E538 Deficiency of other specified B group vitamins: Secondary | ICD-10-CM

## 2017-04-18 MED ORDER — CYANOCOBALAMIN 1000 MCG/ML IJ SOLN
1000.0000 ug | Freq: Once | INTRAMUSCULAR | Status: AC
  Administered 2017-04-18: 1000 ug via INTRAMUSCULAR

## 2017-04-19 ENCOUNTER — Ambulatory Visit: Payer: Medicare Other | Admitting: Physical Therapy

## 2017-04-19 ENCOUNTER — Other Ambulatory Visit: Payer: Self-pay | Admitting: Family Medicine

## 2017-04-19 NOTE — Telephone Encounter (Signed)
plz phone in. 

## 2017-04-19 NOTE — Telephone Encounter (Signed)
Rx called in to requested pharmacy 

## 2017-04-27 HISTORY — PX: OTHER SURGICAL HISTORY: SHX169

## 2017-04-30 ENCOUNTER — Ambulatory Visit: Payer: Medicare Other | Admitting: Physical Therapy

## 2017-05-02 ENCOUNTER — Ambulatory Visit: Payer: Medicare Other | Attending: Neurology | Admitting: Physical Therapy

## 2017-05-02 ENCOUNTER — Encounter: Payer: Self-pay | Admitting: Physical Therapy

## 2017-05-02 DIAGNOSIS — M6281 Muscle weakness (generalized): Secondary | ICD-10-CM | POA: Insufficient documentation

## 2017-05-02 DIAGNOSIS — R262 Difficulty in walking, not elsewhere classified: Secondary | ICD-10-CM | POA: Diagnosis present

## 2017-05-02 DIAGNOSIS — R460 Very low level of personal hygiene: Secondary | ICD-10-CM

## 2017-05-02 DIAGNOSIS — R2689 Other abnormalities of gait and mobility: Secondary | ICD-10-CM | POA: Insufficient documentation

## 2017-05-02 DIAGNOSIS — R279 Unspecified lack of coordination: Secondary | ICD-10-CM | POA: Insufficient documentation

## 2017-05-02 DIAGNOSIS — Z741 Need for assistance with personal care: Secondary | ICD-10-CM

## 2017-05-02 DIAGNOSIS — R2681 Unsteadiness on feet: Secondary | ICD-10-CM | POA: Insufficient documentation

## 2017-05-02 NOTE — Therapy (Signed)
[place], [place], [address] Phone: [phone]   Fax:  [phone]  Physical Therapy Treatment  Patient Details  Name: [name] MRN: [id number] Date of Birth: 06/07/1946 Referring Provider: [name]  Encounter Date: 05/02/2017      PT End of Session - 05/02/17 1049    Visit Number 5   Date for PT Re-Evaluation 05/10/17   PT Start Time 1007   PT Stop Time 1051   PT Time Calculation (min) 44 min   Activity Tolerance Patient tolerated treatment well   Behavior During Therapy [facility] for tasks assessed/performed      Past Medical History:  Diagnosis Date  . ANEMIA-NOS 09/25/2007  . B12 DEFICIENCY 05/03/2007  . CAD (coronary artery disease) 01/2010   MI, Nishan  . Cardiomyopathy ([place]) 01/2010  . Cardiomyopathy ([place]) 02/08/2010   H/o this 2012 after urosepsis, no recurrence.   Marland Kitchen FIBROMYALGIA 05/03/2007  . GERD 02/22/2010  . Glaucoma 02/2013   [facility]  . History of CHF (congestive heart failure) 01/2010  . History of colon polyps 2004  . HYPERLIPIDEMIA 12/19/2007  . HYPOTENSION, ORTHOSTATIC 12/06/2008  . Interstitial cystitis    Ottelin now Dr Amalia Hailey  . Lupus (systemic lupus erythematosus) ([place]) 02/08/2010  . MCTD (mixed connective tissue disease) ([place]) 02/08/2010  . OSTEOPOROSIS 08/2009   bisphosphonate on hold 2/2 dysphagia, on reclast done in August each year  . Parkinson's disease ([place]) 08/25/2015   Dx Dr Carles Collet 07/2015   . 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 Takotsubo cardiomyopathy 2008   due to E coli urosepsis    Past Surgical History:  Procedure Laterality Date  . ABDOMINAL HYSTERECTOMY  1970s   IUD infection - first partial then with oophorectomy (cysts), complication - low blood pressure  . CHOLECYSTECTOMY     complication - low blood pressure  . COLONOSCOPY  06/2008   h/o polyps but latest WNL, rec rpt 10 yrs Olevia Perches)  . CYSTOSCOPY  12/2013   abx treatment for recurrent cystitis  . DEXA  04/2013   T -2.9 @ femur, -1.6 @ spine    There were no vitals filed for this visit.      Subjective Assessment - 05/02/17 1013    Subjective Patient reports that she has continued to have LBP, unsure of why, maybe exercise   Currently in Pain? Yes   Pain Score 5    Pain Location Back   Pain Orientation Lower   Aggravating Factors  sitting   Pain Relieving Factors moving                         [facility] Adult PT Treatment/Exercise - 05/02/17 0001      Ambulation/Gait   Gait Comments ambulation with patient outside around the building, some cues to have arm swing, a few times where she report right LE fatigue especially stepping off the curb, then went back outside and worked on [employer]   High Level Balance Activities Side stepping;Backward walking;Tandem walking;Head turns;Negotiating over obstacles   High Level Balance Comments airex standing, heead motions, eyes closed, patient had a very difficult time on the compliant surface, solid surface head turns, ball tossing     Knee/Hip Exercises: Aerobic   Nustep Lvl 4, 6 min  PT Long Term Goals - 05/02/17 1052      PT LONG TERM GOAL #3   Title Pt will be independent for home exercises   Status Partially Met     PT LONG TERM GOAL #4   Title pt will improve R quad strength to 4/5 or greater   Status On-going               Plan - 05/02/17 1049    Clinical Impression Statement Patient has great difficulty with any balance activties, especially on a compliant surface.  She tends to have a hyper reaction to a loss of balance, with big jerky motions to correct, she always corrected but did need some CGA to maintain balance.  She needed some cues for arm swing during gait.   PT Next Visit Plan focus treatment more on balance   Consulted and Agree with Plan of Care Patient      Patient will  benefit from skilled therapeutic intervention in order to improve the following deficits and impairments:  Abnormal gait, Decreased balance, Decreased mobility, Decreased strength, Difficulty walking  Visit Diagnosis: Other abnormalities of gait and mobility  Muscle weakness (generalized)  Self-care deficit for bathing and hygiene  Difficulty walking  Unsteady gait  Lack of coordination     Problem List Patient Active Problem List   Diagnosis Date Noted  . Right foot pain 04/28/2016  . Skin rash 04/28/2016  . GAD (generalized anxiety disorder) 04/28/2016  . Chronic insomnia 03/07/2016  . Left Achilles tendinitis 12/08/2015  . Parkinson's disease ([place]) 08/25/2015  . Advanced care planning/counseling discussion 05/26/2015  . Family circumstance 02/23/2015  . Health maintenance examination 05/21/2014  . MDD (major depressive disorder), single episode, moderate ([place]) 11/12/2013  . Dysuria 09/02/2013  . Back pain 05/31/2013  . Medicare annual wellness visit, subsequent 05/20/2013  . HLD (hyperlipidemia) 05/20/2013  . Vitamin D deficiency 05/09/2013  . 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 Recurrent UTI 01/03/2011  . Syncope 01/03/2011  . RASH-NONVESICULAR 05/11/2010  . GERD 02/22/2010  . CAD (coronary artery disease) 01/25/2010  . Osteoporosis 11/10/2009  . HYPOTENSION, ORTHOSTATIC 12/06/2008  . Anemia 09/25/2007  . B12 deficiency 05/03/2007  . Fibromyalgia 05/03/2007    Sumner Boast., PT 05/02/2017, 10:53 AM  [place], [place], [address] Phone: [phone]   Fax:  [phone]  Name: [name] MRN: [id number] Date of Birth: [birth year]

## 2017-05-03 ENCOUNTER — Telehealth: Payer: Self-pay | Admitting: Neurology

## 2017-05-03 NOTE — Telephone Encounter (Signed)
PT called and wants to let Dr [name] know she has been on the medication  Northera for 6 days and taking 6 pills a day

## 2017-05-03 NOTE — Telephone Encounter (Signed)
Routed to Dr. Carles Collet

## 2017-05-04 ENCOUNTER — Other Ambulatory Visit: Payer: Self-pay | Admitting: Family Medicine

## 2017-05-04 ENCOUNTER — Telehealth: Payer: Self-pay | Admitting: Neurology

## 2017-05-04 DIAGNOSIS — Z1231 Encounter for screening mammogram for malignant neoplasm of breast: Secondary | ICD-10-CM

## 2017-05-04 NOTE — Telephone Encounter (Signed)
PT would like a call back regarding the medication Northera that Dr [name] has her taking and she needs clarification on exactly how to take it

## 2017-05-04 NOTE — Telephone Encounter (Signed)
Left message for patient to call me back. 

## 2017-05-04 NOTE — Telephone Encounter (Signed)
I have called patient and left a message for her to call me back.

## 2017-05-04 NOTE — Telephone Encounter (Signed)
Okay.  Is her dizziness better?  Is she checking her BP and if so, how has that been

## 2017-05-07 ENCOUNTER — Ambulatory Visit (INDEPENDENT_AMBULATORY_CARE_PROVIDER_SITE_OTHER)
Admission: RE | Admit: 2017-05-07 | Discharge: 2017-05-07 | Disposition: A | Discharge status: Home / Self Care | Payer: Medicare Other | Source: Ambulatory Visit | Attending: Family Medicine | Admitting: Family Medicine

## 2017-05-07 DIAGNOSIS — E559 Vitamin D deficiency, unspecified: Secondary | ICD-10-CM

## 2017-05-07 DIAGNOSIS — M810 Age-related osteoporosis without current pathological fracture: Secondary | ICD-10-CM

## 2017-05-08 ENCOUNTER — Ambulatory Visit: Payer: Medicare Other | Admitting: Physical Therapy

## 2017-05-08 ENCOUNTER — Encounter: Payer: Self-pay | Admitting: Physical Therapy

## 2017-05-08 DIAGNOSIS — R2689 Other abnormalities of gait and mobility: Secondary | ICD-10-CM

## 2017-05-08 DIAGNOSIS — R262 Difficulty in walking, not elsewhere classified: Secondary | ICD-10-CM

## 2017-05-08 DIAGNOSIS — R460 Very low level of personal hygiene: Secondary | ICD-10-CM

## 2017-05-08 DIAGNOSIS — Z741 Need for assistance with personal care: Secondary | ICD-10-CM

## 2017-05-08 DIAGNOSIS — M6281 Muscle weakness (generalized): Secondary | ICD-10-CM

## 2017-05-08 NOTE — Therapy (Signed)
[place], [place], [address] Phone: [phone]   Fax:  [phone]  Physical Therapy Treatment  Patient Details  Name: [name] MRN: [id number] Date of Birth: 1946-01-30 Referring Provider: [name]  Encounter Date: 05/08/2017      PT End of Session - 05/08/17 1140    Visit Number 6   Date for PT Re-Evaluation 05/10/17   PT Start Time 1057   PT Stop Time 1141   PT Time Calculation (min) 44 min   Activity Tolerance Patient tolerated treatment well   Behavior During Therapy [facility] for tasks assessed/performed      Past Medical History:  Diagnosis Date  . ANEMIA-NOS 09/25/2007  . B12 DEFICIENCY 05/03/2007  . CAD (coronary artery disease) 01/2010   MI, Nishan  . Cardiomyopathy ([place]) 01/2010  . Cardiomyopathy ([place]) 02/08/2010   H/o this 2012 after urosepsis, no recurrence.   Marland Kitchen FIBROMYALGIA 05/03/2007  . GERD 02/22/2010  . Glaucoma 02/2013    eye center  . History of CHF (congestive heart failure) 01/2010  . History of colon polyps 2004  . HYPERLIPIDEMIA 12/19/2007  . HYPOTENSION, ORTHOSTATIC 12/06/2008  . Interstitial cystitis    Ottelin now Dr Amalia Hailey  . Lupus (systemic lupus erythematosus) ([place]) 02/08/2010  . MCTD (mixed connective tissue disease) ([place]) 02/08/2010  . OSTEOPOROSIS 08/2009   bisphosphonate on hold 2/2 dysphagia, on reclast done in August each year  . Parkinson's disease ([place]) 08/25/2015   Dx Dr Carles Collet 07/2015   . 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 Takotsubo cardiomyopathy 2008   due to E coli urosepsis    Past Surgical History:  Procedure Laterality Date  . ABDOMINAL HYSTERECTOMY  1970s   IUD infection - first partial then with oophorectomy (cysts), complication - low blood pressure  . CHOLECYSTECTOMY     complication - low blood pressure  . COLONOSCOPY  06/2008   h/o polyps but latest WNL, rec rpt 10 yrs Olevia Perches)  . CYSTOSCOPY  12/2013   abx treatment for recurrent cystitis  . DEXA  04/2013   T -2.9 @ femur, -1.6 @ spine    There were no vitals filed for this visit.      Subjective Assessment - 05/08/17 1056    Subjective "Im miserable, the weather, my back and joints hurt"   Currently in Pain? Yes   Pain Score 6    Pain Location Back                         [facility] Adult PT Treatment/Exercise - 05/08/17 0001      High Level Balance   High Level Balance Activities Side stepping;Backward walking;Marching forwards   High Level Balance Comments airex standing with forward reach.      Lumbar Exercises: Stretches   Passive Hamstring Stretch 4 reps;10 seconds   Single Knee to Chest Stretch 2 reps;10 seconds   Lower Trunk Rotation 3 reps;10 seconds     Lumbar Exercises: Aerobic   Stationary Bike L0 x6 min      Lumbar Exercises: Supine   Bridge 10 reps;2 seconds  x2                     PT Long Term Goals - 05/08/17 1140      PT LONG TERM GOAL #1   Title Pt will improve TUG to 13.5 seconds to decrease  fall risk   Status On-going     PT LONG TERM GOAL #2   Title Pt will improve Berg balance score to 49 or greater to decrease fall risk   Status On-going     PT LONG TERM GOAL #3   Title Pt will be independent for home exercises   Status Partially Met               Plan - 05/08/17 1141    Clinical Impression Statement Pt continues to have a great deal of difficulty with balance interventions. Frequent LOB with side step and backwards walking. Hyper reaction with ariex standing and requires min assist to correct.   Rehab Potential Excellent   Clinical Impairments Affecting Rehab Potential having to care for son that had a stroke   PT Frequency 2x / week   PT Duration 6 weeks   PT Treatment/Interventions ADLs/Self Care Home Management;Therapeutic activities;Therapeutic exercise;Balance training;Neuromuscular re-education   PT Next Visit Plan focus treatment more on balance       Patient will benefit from skilled therapeutic intervention in order to improve the following deficits and impairments:  Abnormal gait, Decreased balance, Decreased mobility, Decreased strength, Difficulty walking  Visit Diagnosis: Other abnormalities of gait and mobility  Muscle weakness (generalized)  Self-care deficit for bathing and hygiene  Difficulty walking     Problem List Patient Active Problem List   Diagnosis Date Noted  . Right foot pain 04/28/2016  . Skin rash 04/28/2016  . GAD (generalized anxiety disorder) 04/28/2016  . Chronic insomnia 03/07/2016  . Left Achilles tendinitis 12/08/2015  . Parkinson's disease ([place]) 08/25/2015  . Advanced care planning/counseling discussion 05/26/2015  . Family circumstance 02/23/2015  . Health maintenance examination 05/21/2014  . MDD (major depressive disorder), single episode, moderate ([place]) 11/12/2013  . Dysuria 09/02/2013  . Back pain 05/31/2013  . Medicare annual wellness visit, subsequent 05/20/2013  . HLD (hyperlipidemia) 05/20/2013  . Vitamin D deficiency 05/09/2013  . 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 Recurrent UTI 01/03/2011  . Syncope 01/03/2011  . RASH-NONVESICULAR 05/11/2010  . GERD 02/22/2010  . CAD (coronary artery disease) 01/25/2010  . Osteoporosis 11/10/2009  . HYPOTENSION, ORTHOSTATIC 12/06/2008  . Anemia 09/25/2007  . B12 deficiency 05/03/2007  . Fibromyalgia 05/03/2007    Scot Jun, PTA 05/08/2017, 11:42 AM  [place], [place], [address] Phone: [phone]   Fax:  [phone]  Name: [name] MRN: [id number] Date of Birth: [birth year]

## 2017-05-15 ENCOUNTER — Encounter: Payer: Self-pay | Admitting: Physical Therapy

## 2017-05-15 ENCOUNTER — Ambulatory Visit: Payer: Medicare Other | Admitting: Physical Therapy

## 2017-05-15 DIAGNOSIS — Z741 Need for assistance with personal care: Secondary | ICD-10-CM

## 2017-05-15 DIAGNOSIS — R460 Very low level of personal hygiene: Secondary | ICD-10-CM

## 2017-05-15 DIAGNOSIS — R2689 Other abnormalities of gait and mobility: Secondary | ICD-10-CM | POA: Diagnosis not present

## 2017-05-15 DIAGNOSIS — M6281 Muscle weakness (generalized): Secondary | ICD-10-CM

## 2017-05-15 NOTE — Therapy (Signed)
[facility] [place], [place], [address] Phone: [phone]   Fax:  [phone]  Physical Therapy Treatment  Patient Details  Name: [name] MRN: [id number] Date of Birth: Sep 26, 1946 Referring Provider: [name]  Encounter Date: 05/15/2017      PT End of Session - 05/15/17 1513    Visit Number 7   Date for PT Re-Evaluation 05/10/17   PT Start Time 1430   PT Stop Time 1513   PT Time Calculation (min) 43 min   Activity Tolerance Patient tolerated treatment well   Behavior During Therapy [facility] for tasks assessed/performed      Past Medical History:  Diagnosis Date  . ANEMIA-NOS 09/25/2007  . B12 DEFICIENCY 05/03/2007  . CAD (coronary artery disease) 01/2010   MI, Nishan  . Cardiomyopathy ([place]) 01/2010  . Cardiomyopathy ([place]) 02/08/2010   H/o this 2012 after urosepsis, no recurrence.   Marland Kitchen FIBROMYALGIA 05/03/2007  . GERD 02/22/2010  . Glaucoma 02/2013   [facility]  . History of CHF (congestive heart failure) 01/2010  . History of colon polyps 2004  . HYPERLIPIDEMIA 12/19/2007  . HYPOTENSION, ORTHOSTATIC 12/06/2008  . Interstitial cystitis    Ottelin now Dr Amalia Hailey  . Lupus (systemic lupus erythematosus) ([place]) 02/08/2010  . MCTD (mixed connective tissue disease) ([place]) 02/08/2010  . OSTEOPOROSIS 08/2009   bisphosphonate on hold 2/2 dysphagia, on reclast done in August each year  . Parkinson's disease ([place]) 08/25/2015   Dx Dr Carles Collet 07/2015   . 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 Takotsubo cardiomyopathy 2008   due to E coli urosepsis    Past Surgical History:  Procedure Laterality Date  . ABDOMINAL HYSTERECTOMY  1970s   IUD infection - first partial then with oophorectomy (cysts), complication - low blood pressure  . CHOLECYSTECTOMY     complication - low blood pressure  . COLONOSCOPY  06/2008   h/o polyps but latest WNL, rec rpt 10 yrs Olevia Perches)  . CYSTOSCOPY  12/2013   abx treatment for recurrent cystitis  . DEXA  04/2013   T -2.9 @ femur, -1.6 @ spine    There were no vitals filed for this visit.      Subjective Assessment - 05/15/17 1433    Subjective "Im still having back problems but Im ok"   Currently in Pain? Yes   Pain Score 4    Pain Location Back   Pain Orientation Right;Left            [facility] PT Assessment - 05/15/17 0001      Berg Balance Test   Sit to Stand Able to stand without using hands and stabilize independently   Standing Unsupported Able to stand safely 2 minutes   Sitting with Back Unsupported but Feet Supported on Floor or Stool Able to sit safely and securely 2 minutes   Stand to Sit Sits safely with minimal use of hands   Transfers Able to transfer safely, minor use of hands   Standing Unsupported with Eyes Closed Able to stand 3 seconds   Standing Ubsupported with Feet Together Able to place feet together independently and stand for 1 minute with supervision   From Standing, Reach Forward with Outstretched Arm Can reach forward >12 cm safely (5")   From Standing Position, Pick up Object from [place] to pick up shoe safely and easily   From Standing Position, Turn to Look Behind Over  each Shoulder Looks behind from both sides and weight shifts well   Turn 360 Degrees Able to turn 360 degrees safely in 4 seconds or less   Standing Unsupported, Alternately Place Feet on Step/Stool Able to stand independently and safely and complete 8 steps in 20 seconds   Standing Unsupported, One Foot in Front Able to take small step independently and hold 30 seconds   Standing on One Leg Able to lift leg independently and hold 5-10 seconds   Total Score 49     Timed Up and Go Test   TUG Normal TUG   Normal TUG (seconds) 13                     [facility] Adult PT Treatment/Exercise - 05/15/17 0001      Lumbar Exercises: Supine   Bridge 10 reps;2 seconds  x2   Straight Leg Raise 10 reps;2 seconds     Knee/Hip  Exercises: Aerobic   Recumbent Bike L0 x4 min    Nustep Lvl 4, 6 min      Knee/Hip Exercises: Machines for Strengthening   Cybex Knee Extension 5lb2x10    Cybex Knee Flexion 25lb 2x10     Knee/Hip Exercises: Standing   Lateral Step Up Both;1 set;10 reps;Hand Hold: 0;Step Height: 6"                     PT Long Term Goals - 05/15/17 1515      PT LONG TERM GOAL #1   Title Pt will improve TUG to 13.5 seconds to decrease fall risk   Status Achieved     PT LONG TERM GOAL #2   Title Pt will improve Berg balance score to 49 or greater to decrease fall risk   Status Achieved     PT LONG TERM GOAL #3   Title Pt will be independent for home exercises   Status Partially Met               Plan - 05/15/17 1513    Clinical Impression Statement Pt has progressed improving both BERG and TUG scores. Pt has some difficulty with forward lean and reach LOB x1 during balance test. Cues needed to stay focuses on activities during balance test.    Rehab Potential Excellent   Clinical Impairments Affecting Rehab Potential having to care for son that had a stroke   PT Treatment/Interventions ADLs/Self Care Home Management;Therapeutic activities;Therapeutic exercise;Balance training;Neuromuscular re-education   PT Next Visit Plan focus treatment more on balance      Patient will benefit from skilled therapeutic intervention in order to improve the following deficits and impairments:  Abnormal gait, Decreased balance, Decreased mobility, Decreased strength, Difficulty walking  Visit Diagnosis: Other abnormalities of gait and mobility  Muscle weakness (generalized)  Self-care deficit for bathing and hygiene     Problem List Patient Active Problem List   Diagnosis Date Noted  . Right foot pain 04/28/2016  . Skin rash 04/28/2016  . GAD (generalized anxiety disorder) 04/28/2016  . Chronic insomnia 03/07/2016  . Left Achilles tendinitis 12/08/2015  . Parkinson's disease  ([place]) 08/25/2015  . Advanced care planning/counseling discussion 05/26/2015  . Family circumstance 02/23/2015  . Health maintenance examination 05/21/2014  . MDD (major depressive disorder), single episode, moderate ([place]) 11/12/2013  . Dysuria 09/02/2013  . Back pain 05/31/2013  . Medicare annual wellness visit, subsequent 05/20/2013  . HLD (hyperlipidemia) 05/20/2013  . Vitamin D deficiency 05/09/2013  . 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 Recurrent UTI 01/03/2011  . Syncope 01/03/2011  . RASH-NONVESICULAR 05/11/2010  . GERD 02/22/2010  . CAD (coronary artery disease) 01/25/2010  . Osteoporosis 11/10/2009  . HYPOTENSION, ORTHOSTATIC 12/06/2008  . Anemia 09/25/2007  . B12 deficiency 05/03/2007  . Fibromyalgia 05/03/2007    Scot Jun , PTA 05/15/2017, 3:15 PM  [facility], [place], [address] Phone: [phone]   Fax:  [phone]  Name: [name] MRN: [id number] Date of Birth: 08-23-1946

## 2017-05-17 ENCOUNTER — Ambulatory Visit: Payer: Medicare Other | Admitting: Physical Therapy

## 2017-05-17 ENCOUNTER — Encounter: Payer: Self-pay | Admitting: Physical Therapy

## 2017-05-17 DIAGNOSIS — R2689 Other abnormalities of gait and mobility: Secondary | ICD-10-CM

## 2017-05-17 DIAGNOSIS — Z741 Need for assistance with personal care: Secondary | ICD-10-CM

## 2017-05-17 DIAGNOSIS — M6281 Muscle weakness (generalized): Secondary | ICD-10-CM

## 2017-05-17 DIAGNOSIS — R460 Very low level of personal hygiene: Secondary | ICD-10-CM

## 2017-05-17 NOTE — Therapy (Signed)
[place], [place], [address] Phone: [phone]   Fax:  [phone]  Physical Therapy Treatment  Patient Details  Name: [name] MRN: [id number] Date of Birth: 05/06/1946 Referring Provider: [name]  Encounter Date: 05/17/2017      PT End of Session - 05/17/17 1339    Visit Number 8   PT Start Time 1300   PT Stop Time 4193   PT Time Calculation (min) 43 min      Past Medical History:  Diagnosis Date  . ANEMIA-NOS 09/25/2007  . B12 DEFICIENCY 05/03/2007  . CAD (coronary artery disease) 01/2010   MI, Nishan  . Cardiomyopathy ([place]) 01/2010  . Cardiomyopathy ([place]) 02/08/2010   H/o this 2012 after urosepsis, no recurrence.   Marland Kitchen FIBROMYALGIA 05/03/2007  . GERD 02/22/2010  . Glaucoma 02/2013   [facility]  . History of CHF (congestive heart failure) 01/2010  . History of colon polyps 2004  . HYPERLIPIDEMIA 12/19/2007  . HYPOTENSION, ORTHOSTATIC 12/06/2008  . Interstitial cystitis    Ottelin now Dr Amalia Hailey  . Lupus (systemic lupus erythematosus) ([place]) 02/08/2010  . MCTD (mixed connective tissue disease) ([place]) 02/08/2010  . OSTEOPOROSIS 08/2009   bisphosphonate on hold 2/2 dysphagia, on reclast done in August each year  . Parkinson's disease ([place]) 08/25/2015   Dx Dr Carles Collet 07/2015   . 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 Takotsubo cardiomyopathy 2008   due to E coli urosepsis    Past Surgical History:  Procedure Laterality Date  . ABDOMINAL HYSTERECTOMY  1970s   IUD infection - first partial then with oophorectomy (cysts), complication - low blood pressure  . CHOLECYSTECTOMY     complication - low blood pressure  . COLONOSCOPY  06/2008   h/o polyps but latest WNL, rec rpt 10 yrs Olevia Perches)  . CYSTOSCOPY  12/2013   abx treatment for recurrent cystitis  . DEXA  04/2013   T -2.9 @ femur, -1.6 @ spine    There were no vitals filed for this visit.      Subjective  Assessment - 05/17/17 1303    Subjective "Slow" "My legs has been hurting, I had a cramp in it a while ago"   Currently in Pain? Yes   Pain Score 4    Pain Location Leg   Pain Orientation Right                         [facility] Adult PT Treatment/Exercise - 05/17/17 0001      High Level Balance   High Level Balance Activities Side stepping;Backward walking;Marching forwards   High Level Balance Comments airex standing with bal toss      Lumbar Exercises: Aerobic   Stationary Bike L0 x6 min      Lumbar Exercises: Standing   Row Both;15 reps;Theraband   Theraband Level (Row) Level 3 (Green)   Shoulder Extension Both;15 reps;Theraband   Theraband Level (Shoulder Extension) Level 3 (Green)     Lumbar Exercises: Supine   Clam 10 reps;2 seconds  x2   Bridge 10 reps;2 seconds  x2   Other Supine Lumbar Exercises hooklying and march     Knee/Hip Exercises: Machines for Strengthening   Cybex Knee Extension 5lb2x10    Cybex Knee Flexion 25lb 2x10   Cybex Leg Press 20lb 2x10  PT Long Term Goals - 05/15/17 1515      PT LONG TERM GOAL #1   Title Pt will improve TUG to 13.5 seconds to decrease fall risk   Status Achieved     PT LONG TERM GOAL #2   Title Pt will improve Berg balance score to 49 or greater to decrease fall risk   Status Achieved     PT LONG TERM GOAL #3   Title Pt will be independent for home exercises   Status Partially Met               Plan - 05/17/17 1339    Clinical Impression Statement Pr with some instability with balance interventions, does reports some R leg pain during today's session. Some LBP reported while on leg press. despite complaints she was able to perform all of today's interventions.   Rehab Potential Excellent   PT Frequency 2x / week   PT Duration 6 weeks   PT Treatment/Interventions ADLs/Self Care Home Management;Therapeutic activities;Therapeutic exercise;Balance  training;Neuromuscular re-education   PT Next Visit Plan focus treatment more on balance      Patient will benefit from skilled therapeutic intervention in order to improve the following deficits and impairments:  Abnormal gait, Decreased balance, Decreased mobility, Decreased strength, Difficulty walking  Visit Diagnosis: Other abnormalities of gait and mobility  Self-care deficit for bathing and hygiene  Muscle weakness (generalized)     Problem List Patient Active Problem List   Diagnosis Date Noted  . Right foot pain 04/28/2016  . Skin rash 04/28/2016  . GAD (generalized anxiety disorder) 04/28/2016  . Chronic insomnia 03/07/2016  . Left Achilles tendinitis 12/08/2015  . Parkinson's disease ([place]) 08/25/2015  . Advanced care planning/counseling discussion 05/26/2015  . Family circumstance 02/23/2015  . Health maintenance examination 05/21/2014  . MDD (major depressive disorder), single episode, moderate ([place]) 11/12/2013  . Dysuria 09/02/2013  . Back pain 05/31/2013  . Medicare annual wellness visit, subsequent 05/20/2013  . HLD (hyperlipidemia) 05/20/2013  . Vitamin D deficiency 05/09/2013  . 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 Recurrent UTI 01/03/2011  . Syncope 01/03/2011  . RASH-NONVESICULAR 05/11/2010  . GERD 02/22/2010  . CAD (coronary artery disease) 01/25/2010  . Osteoporosis 11/10/2009  . HYPOTENSION, ORTHOSTATIC 12/06/2008  . Anemia 09/25/2007  . B12 deficiency 05/03/2007  . Fibromyalgia 05/03/2007    Scot Jun, PTA 05/17/2017, 1:44 PM  [place], [place], [address] Phone: [phone]   Fax:  [phone]  Name: [name] MRN: [id number] Date of Birth: 1946-07-20

## 2017-05-20 ENCOUNTER — Encounter: Payer: Self-pay | Admitting: Family Medicine

## 2017-05-22 ENCOUNTER — Ambulatory Visit: Payer: Medicare Other | Admitting: Physical Therapy

## 2017-05-22 ENCOUNTER — Encounter: Payer: Self-pay | Admitting: Physical Therapy

## 2017-05-22 DIAGNOSIS — R2689 Other abnormalities of gait and mobility: Secondary | ICD-10-CM

## 2017-05-22 DIAGNOSIS — R460 Very low level of personal hygiene: Secondary | ICD-10-CM

## 2017-05-22 DIAGNOSIS — M6281 Muscle weakness (generalized): Secondary | ICD-10-CM

## 2017-05-22 DIAGNOSIS — R262 Difficulty in walking, not elsewhere classified: Secondary | ICD-10-CM

## 2017-05-22 DIAGNOSIS — Z741 Need for assistance with personal care: Secondary | ICD-10-CM

## 2017-05-22 DIAGNOSIS — R2681 Unsteadiness on feet: Secondary | ICD-10-CM

## 2017-05-22 NOTE — Therapy (Signed)
[place], [place], [address] Phone: [phone]   Fax:  [phone]  Physical Therapy Treatment  Patient Details  Name: [name] MRN: [id number] Date of Birth: 15-Sep-1946 Referring Provider: [name]  Encounter Date: 05/22/2017      PT End of Session - 05/22/17 1340    Visit Number 9   Date for PT Re-Evaluation 06/09/17   PT Start Time 1300   PT Stop Time 1346   PT Time Calculation (min) 46 min      Past Medical History:  Diagnosis Date  . ANEMIA-NOS 09/25/2007  . B12 DEFICIENCY 05/03/2007  . CAD (coronary artery disease) 01/2010   MI, Nishan  . Cardiomyopathy ([place]) 01/2010  . Cardiomyopathy ([place]) 02/08/2010   H/o this 2012 after urosepsis, no recurrence.   Marland Kitchen FIBROMYALGIA 05/03/2007  . GERD 02/22/2010  . Glaucoma 02/2013   [facility]  . History of CHF (congestive heart failure) 01/2010  . History of colon polyps 2004  . HYPERLIPIDEMIA 12/19/2007  . HYPOTENSION, ORTHOSTATIC 12/06/2008  . Interstitial cystitis    Ottelin now Dr Amalia Hailey  . Lupus (systemic lupus erythematosus) ([place]) 02/08/2010  . MCTD (mixed connective tissue disease) ([place]) 02/08/2010  . OSTEOPOROSIS 08/2009   bisphosphonate on hold 2/2 dysphagia, on reclast done in August each year  . Parkinson's disease ([place]) 08/25/2015   Dx Dr Carles Collet 07/2015   . 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 Takotsubo cardiomyopathy 2008   due to E coli urosepsis    Past Surgical History:  Procedure Laterality Date  . ABDOMINAL HYSTERECTOMY  1970s   IUD infection - first partial then with oophorectomy (cysts), complication - low blood pressure  . CHOLECYSTECTOMY     complication - low blood pressure  . COLONOSCOPY  06/2008   h/o polyps but latest WNL, rec rpt 10 yrs Olevia Perches)  . CYSTOSCOPY  12/2013   abx treatment for recurrent cystitis  . DEXA  04/2013   T -2.9 @ femur, -1.6 @ spine  . DEXA  04/2017   T -2.9  hip, -0.7 spine    There were no vitals filed for this visit.      Subjective Assessment - 05/22/17 1301    Subjective pain in back running down RT leg   Currently in Pain? Yes   Pain Score 6    Pain Location Leg   Pain Orientation Right                         [facility] Adult PT Treatment/Exercise - 05/22/17 0001      High Level Balance   High Level Balance Activities Side stepping;Backward walking;Marching forwards;Tandem walking;Negotiating over obstacles  3# ankle wts   High Level Balance Comments ball toss and kicks- LOB to RT 3 times     Lumbar Exercises: Aerobic   Stationary Bike Nustep L 4 6 min     Lumbar Exercises: Supine   Bridge 15 reps  with ball + KTC and obl     Knee/Hip Exercises: Machines for Strengthening   Cybex Knee Extension 5lb2x10    Cybex Knee Flexion 25lb 2x10   Cybex Leg Press 30# 10 times, 20 # 10 times     Knee/Hip Exercises: Standing   Forward Step Up Step Height: 6";20 reps;2 sets;Both  alt step touch   Walking with Sports Cord 20# fwd and back 5 times  each and 3 times each side  min A multi LOB     Manual Therapy   Manual Therapy Passive ROM   Passive ROM trunk and RT LE                     PT Long Term Goals - 05/15/17 1515      PT LONG TERM GOAL #1   Title Pt will improve TUG to 13.5 seconds to decrease fall risk   Status Achieved     PT LONG TERM GOAL #2   Title Pt will improve Berg balance score to 49 or greater to decrease fall risk   Status Achieved     PT LONG TERM GOAL #3   Title Pt will be independent for home exercises   Status Partially Met               Plan - 05/22/17 1340    Clinical Impression Statement weakness and fatigue with wt ex 3# and leg press, LOB esp to the RT with balance actvities   PT Next Visit Plan 10th assess goals and GCode      Patient will benefit from skilled therapeutic intervention in order to improve the following deficits and impairments:      Visit Diagnosis: Other abnormalities of gait and mobility  Self-care deficit for bathing and hygiene  Muscle weakness (generalized)  Difficulty walking  Unsteady gait     Problem List Patient Active Problem List   Diagnosis Date Noted  . Right foot pain 04/28/2016  . Skin rash 04/28/2016  . GAD (generalized anxiety disorder) 04/28/2016  . Chronic insomnia 03/07/2016  . Left Achilles tendinitis 12/08/2015  . Parkinson's disease ([place]) 08/25/2015  . Advanced care planning/counseling discussion 05/26/2015  . Family circumstance 02/23/2015  . Health maintenance examination 05/21/2014  . MDD (major depressive disorder), single episode, moderate ([place]) 11/12/2013  . Dysuria 09/02/2013  . Back pain 05/31/2013  . Medicare annual wellness visit, subsequent 05/20/2013  . HLD (hyperlipidemia) 05/20/2013  . Vitamin D deficiency 05/09/2013  . 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 Recurrent UTI 01/03/2011  . Syncope 01/03/2011  . RASH-NONVESICULAR 05/11/2010  . GERD 02/22/2010  . CAD (coronary artery disease) 01/25/2010  . Osteoporosis 11/10/2009  . HYPOTENSION, ORTHOSTATIC 12/06/2008  . Anemia 09/25/2007  . B12 deficiency 05/03/2007  . Fibromyalgia 05/03/2007    Nickoles Gregori,ANGIE PTA 05/22/2017, 1:42 PM  [place], [place], [address] Phone: [phone]   Fax:  [phone]  Name: [name] MRN: [id number] Date of Birth: 1946/06/02

## 2017-05-23 ENCOUNTER — Ambulatory Visit: Payer: Medicare Other

## 2017-05-24 ENCOUNTER — Ambulatory Visit: Payer: Medicare Other

## 2017-05-24 ENCOUNTER — Ambulatory Visit: Payer: Medicare Other | Admitting: Physical Therapy

## 2017-05-24 ENCOUNTER — Encounter: Payer: Self-pay | Admitting: Family Medicine

## 2017-05-24 ENCOUNTER — Encounter: Payer: Self-pay | Admitting: Physical Therapy

## 2017-05-24 ENCOUNTER — Ambulatory Visit
Admission: RE | Admit: 2017-05-24 | Discharge: 2017-05-24 | Disposition: A | Discharge status: Home / Self Care | Payer: Medicare Other | Source: Ambulatory Visit | Attending: Family Medicine | Admitting: Family Medicine

## 2017-05-24 DIAGNOSIS — M6281 Muscle weakness (generalized): Secondary | ICD-10-CM

## 2017-05-24 DIAGNOSIS — R2681 Unsteadiness on feet: Secondary | ICD-10-CM

## 2017-05-24 DIAGNOSIS — R2689 Other abnormalities of gait and mobility: Secondary | ICD-10-CM

## 2017-05-24 DIAGNOSIS — Z1231 Encounter for screening mammogram for malignant neoplasm of breast: Secondary | ICD-10-CM

## 2017-05-24 LAB — HM MAMMOGRAPHY

## 2017-05-24 NOTE — Therapy (Signed)
[facility] [place], [place], [address] Phone: [phone]   Fax:  [phone]  Physical Therapy Treatment  Patient Details  Name: [name] MRN: [id number] Date of Birth: 1946/01/07 Referring Provider: [name]  Encounter Date: 05/24/2017      PT End of Session - 05/24/17 1412    Visit Number 10   Date for PT Re-Evaluation 06/09/17   PT Start Time 1400   PT Stop Time 1445   PT Time Calculation (min) 45 min      Past Medical History:  Diagnosis Date  . ANEMIA-NOS 09/25/2007  . B12 DEFICIENCY 05/03/2007  . CAD (coronary artery disease) 01/2010   MI, Nishan  . Cardiomyopathy ([place]) 01/2010  . Cardiomyopathy ([place]) 02/08/2010   H/o this 2012 after urosepsis, no recurrence.   Marland Kitchen FIBROMYALGIA 05/03/2007  . GERD 02/22/2010  . Glaucoma 02/2013   [facility]  . History of CHF (congestive heart failure) 01/2010  . History of colon polyps 2004  . HYPERLIPIDEMIA 12/19/2007  . HYPOTENSION, ORTHOSTATIC 12/06/2008  . Interstitial cystitis    Ottelin now Dr Amalia Hailey  . Lupus (systemic lupus erythematosus) ([place]) 02/08/2010  . MCTD (mixed connective tissue disease) ([place]) 02/08/2010  . OSTEOPOROSIS 08/2009   bisphosphonate on hold 2/2 dysphagia, on reclast done in August each year  . Parkinson's disease ([place]) 08/25/2015   Dx Dr Carles Collet 07/2015   . 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 Takotsubo cardiomyopathy 2008   due to E coli urosepsis    Past Surgical History:  Procedure Laterality Date  . ABDOMINAL HYSTERECTOMY  1970s   IUD infection - first partial then with oophorectomy (cysts), complication - low blood pressure  . CHOLECYSTECTOMY     complication - low blood pressure  . COLONOSCOPY  06/2008   h/o polyps but latest WNL, rec rpt 10 yrs Olevia Perches)  . CYSTOSCOPY  12/2013   abx treatment for recurrent cystitis  . DEXA  04/2013   T -2.9 @ femur, -1.6 @ spine  . DEXA  04/2017   T -2.9  hip, -0.7 spine    There were no vitals filed for this visit.      Subjective Assessment - 05/24/17 1359    Subjective better than the other day   Currently in Pain? Yes   Pain Score 3    Pain Location Back                         [facility] Adult PT Treatment/Exercise - 05/24/17 0001      Lumbar Exercises: Supine   Clam 20 reps  red tband   Bridge 15 reps     Knee/Hip Exercises: Aerobic   Nustep L 5 6 min     Knee/Hip Exercises: Machines for Strengthening   Cybex Knee Extension 5# 3 sets 10   Cybex Knee Flexion 25lb 3 x10   Cybex Leg Press 30# 2 sets 10                PT Education - 05/24/17 1411    Education provided Yes   Education Details HEP for Balance   Person(s) Educated Patient   Methods Explanation;Demonstration;Handout   Comprehension Verbalized understanding;Returned demonstration             PT Long Term Goals - 05/24/17 1402      PT LONG TERM GOAL #3   Title  Pt will be independent for home exercises   Status Achieved     PT LONG TERM GOAL #4   Title pt will improve R quad strength to 4/5 or greater   Baseline 4+/5   Status Achieved               Plan - 27-May-2017 1412    Clinical Impression Statement all goals met, independant with HEP. pt pleased with progress and feels good with continuing with HEP.   PT Next Visit Plan HOLD 2 weeks and D/C if no follow up      Patient will benefit from skilled therapeutic intervention in order to improve the following deficits and impairments:     Visit Diagnosis: Other abnormalities of gait and mobility  Muscle weakness (generalized)  Unsteady gait       G-Codes - 2017/05/27 1424    Functional Assessment Tool Used (Outpatient Only) FOTO 25% limitation   Functional Limitation Mobility: Walking and moving around   Mobility: Walking and Moving Around Current Status (S8110) At least 20 percent but less than 40 percent impaired, limited or restricted   Mobility:  Walking and Moving Around Goal Status [phone]) At least 20 percent but less than 40 percent impaired, limited or restricted      Problem List Patient Active Problem List   Diagnosis Date Noted  . Right foot pain 04/28/2016  . Skin rash 04/28/2016  . GAD (generalized anxiety disorder) 04/28/2016  . Chronic insomnia 03/07/2016  . Left Achilles tendinitis 12/08/2015  . Parkinson's disease ([place]) 08/25/2015  . Advanced care planning/counseling discussion 05/26/2015  . Family circumstance 02/23/2015  . Health maintenance examination 05/21/2014  . MDD (major depressive disorder), single episode, moderate ([place]) 11/12/2013  . Dysuria 09/02/2013  . Back pain 05/31/2013  . Medicare annual wellness visit, subsequent 05/20/2013  . HLD (hyperlipidemia) 05/20/2013  . Vitamin D deficiency 05/09/2013  . 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 Recurrent UTI 01/03/2011  . Syncope 01/03/2011  . RASH-NONVESICULAR 05/11/2010  . GERD 02/22/2010  . CAD (coronary artery disease) 01/25/2010  . Osteoporosis 11/10/2009  . HYPOTENSION, ORTHOSTATIC 12/06/2008  . Anemia 09/25/2007  . B12 deficiency 05/03/2007  . Fibromyalgia 05/03/2007    Sina Sumpter,ANGIE PTA 2017-05-27, 2:26 PM  [place], [place], [address] Phone: [phone]   Fax:  [phone]  Name: [name] MRN: [id number] Date of Birth: 02-Aug-1946

## 2017-05-24 NOTE — Progress Notes (Unsigned)
Per Dr. Dionisio Paschal mammogram normal repeat 1 year.

## 2017-05-28 ENCOUNTER — Telehealth: Payer: Self-pay | Admitting: Neurology

## 2017-05-28 MED ORDER — ABDOMINAL BINDER/ELASTIC MED MISC: 1.0000 | Freq: Every day | 0 refills | Status: DC

## 2017-05-28 MED ORDER — DROXIDOPA 300 MG PO CAPS
600.0000 mg | ORAL_CAPSULE | Freq: Three times a day (TID) | ORAL | 1 refills | Status: DC
Start: 2017-05-28 — End: 2017-07-13

## 2017-05-28 NOTE — Telephone Encounter (Signed)
Spoke with patient. Rytary request written in error - patient is not and has never been on that drug.   Patient almost out of Northera.  She in on 600 mg TID. Her dizziness is much better. She has only experienced minimal amounts of dizziness during physical therapy.  She states blood pressures still running at average 98-108/49-60. Sometimes higher and lower than this. Please advise.

## 2017-05-28 NOTE — Telephone Encounter (Signed)
Patient made aware. RX sent to [facility]. She does not have an abdominal binder but RX written and she will pick this up. She states she is drinking lots of water.

## 2017-05-28 NOTE — Telephone Encounter (Signed)
Patient is almost out of her rytary and would like to have that called into the [facility]. Also she wants to let someone know that her blood pressure is still running low and would like to speak to someone

## 2017-05-28 NOTE — Telephone Encounter (Signed)
Those are okay IF dizziness/sx's are resolved.  Continue medication and will follow.  Is she wearing compression binder?  Drinking water?

## 2017-06-06 ENCOUNTER — Telehealth: Payer: Self-pay | Admitting: Neurology

## 2017-06-06 ENCOUNTER — Ambulatory Visit (INDEPENDENT_AMBULATORY_CARE_PROVIDER_SITE_OTHER): Payer: Medicare Other | Admitting: *Deleted

## 2017-06-06 DIAGNOSIS — E538 Deficiency of other specified B group vitamins: Secondary | ICD-10-CM | POA: Diagnosis not present

## 2017-06-06 MED ORDER — CYANOCOBALAMIN 1000 MCG/ML IJ SOLN
1000.0000 ug | Freq: Once | INTRAMUSCULAR | Status: AC
  Administered 2017-06-06: 1000 ug via INTRAMUSCULAR

## 2017-06-06 NOTE — Telephone Encounter (Signed)
Hedrick called in regards to PT's medication Northera and said something was faxed in June and following up CB# [phone]

## 2017-06-06 NOTE — Telephone Encounter (Signed)
Sent RX for maintenance dose to [facility] [employer] [facility] order) on 05/28/2017 after dose was determined.  Tried to call to discuss but number written on message as call back is a fax number.

## 2017-06-08 ENCOUNTER — Telehealth: Payer: Self-pay | Admitting: Neurology

## 2017-06-08 NOTE — Telephone Encounter (Signed)
Maintenance dose verified and v/o given.

## 2017-06-08 NOTE — Telephone Encounter (Signed)
Caller: Patient   Urgent?No  Reason for the call: She uses [facility] [phone]) She is on Northera and the pharmacy had to her that Dr. Carles Collet needed to call them to be able to refill it. She said she is out of the medication. Please call. Thanks

## 2017-06-12 ENCOUNTER — Ambulatory Visit: Payer: Medicare Other

## 2017-06-15 ENCOUNTER — Other Ambulatory Visit: Payer: Self-pay | Admitting: Family Medicine

## 2017-06-15 NOTE — Telephone Encounter (Signed)
rx called into pharmacy

## 2017-06-15 NOTE — Telephone Encounter (Signed)
plz phone in. 

## 2017-06-18 ENCOUNTER — Encounter: Payer: Self-pay | Admitting: Family Medicine

## 2017-06-24 ENCOUNTER — Other Ambulatory Visit: Payer: Self-pay | Admitting: Neurology

## 2017-06-27 ENCOUNTER — Other Ambulatory Visit: Payer: Self-pay | Admitting: Neurology
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
## 2017-07-13 ENCOUNTER — Encounter: Payer: Self-pay | Admitting: Neurology

## 2017-07-13 ENCOUNTER — Ambulatory Visit (INDEPENDENT_AMBULATORY_CARE_PROVIDER_SITE_OTHER): Payer: Medicare Other | Admitting: Neurology

## 2017-07-13 VITALS — BP 102/60 | HR 64 | Ht 64.75 in | Wt 162.0 lb

## 2017-07-13 DIAGNOSIS — G903 Multi-system degeneration of the autonomic nervous system: Secondary | ICD-10-CM

## 2017-07-13 DIAGNOSIS — G20 Parkinson's disease: Secondary | ICD-10-CM

## 2017-07-13 DIAGNOSIS — F331 Major depressive disorder, recurrent, moderate: Secondary | ICD-10-CM

## 2017-07-13 NOTE — Patient Instructions (Addendum)
Increase carbidopa/levodopa 25/100 to 1.5 tablets three times per day and continue carbidopa/levodopa 50/200 at night Continue your other meds as previous Ask [name], MD about your hip and your cough  Contact [facility] at [phone] so they can send your Northera.

## 2017-07-16 ENCOUNTER — Telehealth: Payer: Self-pay | Admitting: Neurology

## 2017-07-16 ENCOUNTER — Ambulatory Visit (INDEPENDENT_AMBULATORY_CARE_PROVIDER_SITE_OTHER)
Admission: RE | Admit: 2017-07-16 | Discharge: 2017-07-16 | Disposition: A | Discharge status: Home / Self Care | Payer: Medicare Other | Source: Ambulatory Visit | Attending: Family Medicine | Admitting: Family Medicine

## 2017-07-16 ENCOUNTER — Encounter: Payer: Self-pay | Admitting: Family Medicine

## 2017-07-16 ENCOUNTER — Ambulatory Visit (INDEPENDENT_AMBULATORY_CARE_PROVIDER_SITE_OTHER): Payer: Medicare Other | Admitting: Family Medicine

## 2017-07-16 VITALS — BP 124/70 | HR 64 | Temp 97.7°F | Wt 161.2 lb

## 2017-07-16 DIAGNOSIS — Z639 Problem related to primary support group, unspecified: Secondary | ICD-10-CM | POA: Diagnosis not present

## 2017-07-16 DIAGNOSIS — M5441 Lumbago with sciatica, right side: Secondary | ICD-10-CM

## 2017-07-16 DIAGNOSIS — R05 Cough: Secondary | ICD-10-CM | POA: Diagnosis not present

## 2017-07-16 DIAGNOSIS — K219 Gastro-esophageal reflux disease without esophagitis: Secondary | ICD-10-CM | POA: Diagnosis not present

## 2017-07-16 DIAGNOSIS — F411 Generalized anxiety disorder: Secondary | ICD-10-CM | POA: Diagnosis not present

## 2017-07-16 DIAGNOSIS — G8929 Other chronic pain: Secondary | ICD-10-CM

## 2017-07-16 DIAGNOSIS — F321 Major depressive disorder, single episode, moderate: Secondary | ICD-10-CM

## 2017-07-16 DIAGNOSIS — R053 Chronic cough: Secondary | ICD-10-CM

## 2017-07-16 DIAGNOSIS — M810 Age-related osteoporosis without current pathological fracture: Secondary | ICD-10-CM

## 2017-07-16 DIAGNOSIS — M797 Fibromyalgia: Secondary | ICD-10-CM | POA: Diagnosis not present

## 2017-07-16 DIAGNOSIS — E538 Deficiency of other specified B group vitamins: Secondary | ICD-10-CM

## 2017-07-16 DIAGNOSIS — G20 Parkinson's disease: Secondary | ICD-10-CM

## 2017-07-16 HISTORY — DX: Chronic cough: R05.3

## 2017-07-16 MED ORDER — CYANOCOBALAMIN 1000 MCG/ML IJ SOLN
1000.0000 ug | Freq: Once | INTRAMUSCULAR | Status: AC
  Administered 2017-07-16: 1000 ug via INTRAMUSCULAR

## 2017-07-16 MED ORDER — PREDNISONE 20 MG PO TABS: ORAL_TABLET | ORAL | 0 refills | Status: DC

## 2017-07-16 NOTE — Assessment & Plan Note (Signed)
Exam most consistent with right sciatica - treat with prednisone course and stretching exercises that we will mail to patient. Reviewed FM pain vs LBP. Update if not improving with treatment.

## 2017-07-16 NOTE — Patient Instructions (Addendum)
I do recommend setting up counseling - at Dr Doristine Devoid office may be the best option for now. Another option is calling [facility].  For R lower back pain and leg pain - I think this is sciatica. Treat with stretching exercises provided today and prednisone course. Let us know if not improving with this - we may check xrays of lower back.  For cough - try prednisone course. If no better, we will refer you to GI.  Xray today, b12 shot today.

## 2017-07-16 NOTE — Assessment & Plan Note (Signed)
Received first prolia 03/2017. Prior on reclast.

## 2017-07-16 NOTE — Assessment & Plan Note (Signed)
Deteriorated - see below.  

## 2017-07-16 NOTE — Assessment & Plan Note (Addendum)
Ongoing - no improvement with daily PPI points against GERD cause. No signs of bacterial infection that would necessitate antibiotic. Will start with CXR eval and prednisone taper. Consider pulm referral.

## 2017-07-16 NOTE — Assessment & Plan Note (Signed)
Sees neurologist Dr [name]. Noted worsening akithesia today.

## 2017-07-16 NOTE — Assessment & Plan Note (Signed)
Difficult to evaluate current R LBP pain due to diffuse FM pain.

## 2017-07-16 NOTE — Assessment & Plan Note (Signed)
Ongoing stressors. Support provided.

## 2017-07-16 NOTE — Progress Notes (Signed)
BP 124/70   Pulse 64   Temp 97.7 F (36.5 C) (Oral)   Wt 161 lb 4 oz (73.1 kg)   SpO2 98%   BMI 27.04 kg/m    CC: f/u visit, cough Subjective:    Patient ID: [name], female    DOB: Dec 25, 1945, 71 y.o.   MRN: [id number]  HPI: [name] is a 71 y.o. female presenting on 07/16/2017 for Follow-up and Cough   Chronic cough - present over 6 months. Prior visit thought GERD related so we restarted daily PPI omeprazole 40mg  which she has continued without improvement. Cough described as dry nagging cough. Denies fevers. She stopped all sodas, just drinking plain water. Continues to feel globus sensation in back of throat. Some dysphagia "food gets stuck" regularly. Last EGD 2011 - overall normal. If needed pt would like to see Dr Fuller Plan.   2 mo h/o ongoing lower back pain with radiation down R lateral leg to knee, worse in the mornings or after prolonged sitting. Progressively worsening. Denies fevers/chills, numbness, weakness of leg, bowel/bladder accidents. Denies inciting trauma/injury.   Depression - stressful summer with ongoing family stressors.   Relevant past medical, surgical, family and social history reviewed and updated as indicated. Interim medical history since our last visit reviewed. Allergies and medications reviewed and updated. Outpatient Medications Prior to Visit  Medication Sig Dispense Refill  . Calcium Carb-Cholecalciferol (CALCIUM + D3) 600-200 MG-UNIT TABS Take 2 tablets by mouth daily.    . carbidopa-levodopa (SINEMET CR) 50-200 MG tablet TAKE 1 TABLET BY MOUTH AT BEDTIME. 90 tablet 1  . carbidopa-levodopa (SINEMET IR) 25-100 MG tablet TAKE 1 TABLET BY MOUTH 3 (THREE) TIMES DAILY. 270 tablet 1  . cholecalciferol (VITAMIN D) 1000 UNITS tablet Take 1,000 Units by mouth daily.    . clonazePAM (KLONOPIN) 0.5 MG tablet TAKE 1 TABLET BY MOUTH TWICE A DAY AS NEEDED 60 tablet 1  . cyanocobalamin (,VITAMIN B-12,) 1000 MCG/ML injection Inject 1 mL (1,000 mcg  total) into the muscle every 30 (thirty) days.    . Elastic Bandages & Supports (ABDOMINAL BINDER/ELASTIC MED) MISC 1 Device by Does not apply route daily. 1 each 0  . ferrous sulfate 325 (65 FE) MG tablet Take 1 tablet (325 mg total) by mouth every Monday, Wednesday, and Friday.    . gabapentin (NEURONTIN) 100 MG capsule TAKE 1 CAPSULE (100 MG TOTAL) BY MOUTH AT BEDTIME. 90 capsule 1  . latanoprost (XALATAN) 0.005 % ophthalmic solution Place 1 drop into both eyes at bedtime.    . Melatonin 3 MG CAPS Take 3 mg by mouth at bedtime.    Marland Kitchen MYRBETRIQ 50 MG TB24 tablet Take 50 mg by mouth daily.  11  . omeprazole (PRILOSEC) 40 MG capsule Take 1 capsule (40 mg total) by mouth daily. 90 capsule 3  . polyethylene glycol (GLYCOLAX) packet Take 17 g by mouth as needed.      . pramipexole (MIRAPEX) 1 MG tablet TAKE 1 TABLET (1 MG TOTAL) BY MOUTH 3 (THREE) TIMES DAILY. 270 tablet 1  . senna (SENOKOT) 8.6 MG tablet Take 1 tablet by mouth daily.      . sertraline (ZOLOFT) 100 MG tablet TAKE 1 TABLET (100 MG TOTAL) BY MOUTH DAILY. 90 tablet 1  . traMADol (ULTRAM) 50 MG tablet TAKE 1 TABLET BY MOUTH 3 TIMES A DAY AS NEEDED 50 tablet 0  . triamcinolone cream (KENALOG) 0.5 % Apply to affected area twice daily for no more than 2  weeks at a time 30 g 1  . vitamin B-12 (CYANOCOBALAMIN) 500 MCG tablet Take 1 tablet (500 mcg total) by mouth daily.     No facility-administered medications prior to visit.      Per HPI unless specifically indicated in ROS section below Review of Systems     Objective:    BP 124/70   Pulse 64   Temp 97.7 F (36.5 C) (Oral)   Wt 161 lb 4 oz (73.1 kg)   SpO2 98%   BMI 27.04 kg/m   Wt Readings from Last 3 Encounters:  07/16/17 161 lb 4 oz (73.1 kg)  07/13/17 162 lb (73.5 kg)  03/28/17 158 lb (71.7 kg)    Physical Exam  Constitutional: She appears well-developed and well-nourished. No distress.  HENT:  Mouth/Throat: Oropharynx is clear and moist. No oropharyngeal exudate.    Cardiovascular: Normal rate, regular rhythm, normal heart sounds and intact distal pulses.   No murmur heard. Pulmonary/Chest: Effort normal and breath sounds normal. No respiratory distress. She has no wheezes. She has no rales.  Musculoskeletal: She exhibits no edema.  Diffuse lower lumbar back pain as well as R paraspinous mm tenderness + SLR on right No significant pain with int/ext rotation at hip. Tender to palpation at R>L SIJ, but not significant pain at GTB or sciatic notch bilaterally.   Neurological:  More evident akithesia than previously noted  Skin: Skin is warm and dry. No rash noted.  Nursing note and vitals reviewed.  Results for orders placed or performed in visit on 05/24/17  HM MAMMOGRAPHY  Result Value Ref Range   HM Mammogram 0-4 Bi-Rad 0-4 Bi-Rad, Self Reported Normal   Lab Results  Component Value Date   VITAMINB12 1,025 (H) 03/15/2017    Lab Results  Component Value Date   TSH 1.30 11/09/2016       Assessment & Plan:   Problem List Items Addressed This Visit    B12 deficiency    B12 shot today.       Relevant Medications   cyanocobalamin ((VITAMIN B-12)) injection 1,000 mcg (Completed)   Back pain    Exam most consistent with right sciatica - treat with prednisone course and stretching exercises that we will mail to patient. Reviewed FM pain vs LBP. Update if not improving with treatment.       Relevant Medications   predniSONE (DELTASONE) 20 MG tablet   Chronic cough - Primary    Ongoing - no improvement with daily PPI points against GERD cause. No signs of bacterial infection that would necessitate antibiotic. Will start with CXR eval and prednisone taper. Consider pulm referral.       Relevant Orders   DG Chest 2 View (Completed)   Family circumstance    Ongoing stressors. Support provided.      Fibromyalgia    Difficult to evaluate current R LBP pain due to diffuse FM pain.       GAD (generalized anxiety disorder)    Deteriorated  - see below.       GERD    Cough has not improved with daily PPI. See below. With endorsed solid food dysphagia, low threshold to refer back to GI (pt requests Dr Fuller Plan)      MDD (major depressive disorder), single episode, moderate ([place])    Deteriorated, increased stressors reviewed with patient. Support provided. I did encourage referral for counseling. Discussed options - we will likely not be able to get into our counselor here so discussed establishing  with new counselor at Dr Doristine Devoid office or referral to [facility] offices off [address]. Pt will investigate options       Osteoporosis    Received first prolia 03/2017. Prior on reclast.       Parkinson's disease [facility])    Sees neurologist Dr [name]. Noted worsening akithesia today.           Follow up plan: Return if symptoms worsen or fail to improve.  [name], MD

## 2017-07-16 NOTE — Assessment & Plan Note (Signed)
B12 shot today. 

## 2017-07-16 NOTE — Assessment & Plan Note (Addendum)
Cough has not improved with daily PPI. See below. With endorsed solid food dysphagia, low threshold to refer back to GI (pt requests Dr Fuller Plan)

## 2017-07-16 NOTE — Telephone Encounter (Signed)
Left message on machine for patient to call back.   To make her aware I spoke with Northera Rep. Patient will need to restart Northera as follows:  300 mg QD for two days, 300 mg BID for 2 days, 300 mg TID. Stay on this dose then contact us with blood pressure readings. Awaiting call back to make patient aware.

## 2017-07-16 NOTE — Assessment & Plan Note (Signed)
Deteriorated, increased stressors reviewed with patient. Support provided. I did encourage referral for counseling. Discussed options - we will likely not be able to get into our counselor here so discussed establishing with new counselor at Dr Doristine Devoid office or referral to [facility] offices off [address]. Pt will investigate options

## 2017-07-18 NOTE — Telephone Encounter (Signed)
Patient called back and was made aware of instructions.

## 2017-07-23 ENCOUNTER — Telehealth: Payer: Self-pay | Admitting: *Deleted

## 2017-07-23 DIAGNOSIS — R053 Chronic cough: Secondary | ICD-10-CM

## 2017-07-23 DIAGNOSIS — R05 Cough: Secondary | ICD-10-CM

## 2017-07-23 NOTE — Telephone Encounter (Signed)
Spoke to pt and advised per Dr Darnell Level. States she has not received exercises as of yet, but will begin them once she does. Advised to await a call with appt details to GI

## 2017-07-23 NOTE — Telephone Encounter (Signed)
For back - did she get stretching exercises we mailed her and has she been using them? For cough - will refer to GI given some difficulty with swallowing noted as well.

## 2017-07-23 NOTE — Telephone Encounter (Signed)
Patient left a voicemail stating that she saw you last week for a cough. Patient stated that her cough has not gotten any better and it is keeping her awake. Patient wants to know if she should go ahead and see GI and find out what is going on? Patient stated that her back is not any better. Pharmacy-[facility]

## 2017-08-06 ENCOUNTER — Other Ambulatory Visit: Payer: Self-pay | Admitting: Family Medicine

## 2017-08-06 NOTE — Telephone Encounter (Signed)
plz phone in. 

## 2017-08-06 NOTE — Telephone Encounter (Signed)
Last filled 06/15/17 #50 no CSA last OV 07/16/17 no future OV

## 2017-08-09 ENCOUNTER — Other Ambulatory Visit: Payer: Self-pay | Admitting: Family Medicine

## 2017-08-09 NOTE — Telephone Encounter (Signed)
Last filled 06/15/17 #50, latest CSA and uds 04/28/17, last OV 07/16/17 next OV none

## 2017-08-10 ENCOUNTER — Other Ambulatory Visit: Payer: Self-pay | Admitting: Family Medicine

## 2017-08-10 NOTE — Telephone Encounter (Signed)
plz phone in. 

## 2017-08-10 NOTE — Telephone Encounter (Signed)
Left refill on voice mail at pharmacy  

## 2017-08-16 ENCOUNTER — Ambulatory Visit (INDEPENDENT_AMBULATORY_CARE_PROVIDER_SITE_OTHER): Payer: Medicare Other

## 2017-08-16 DIAGNOSIS — E538 Deficiency of other specified B group vitamins: Secondary | ICD-10-CM

## 2017-08-16 MED ORDER — CYANOCOBALAMIN 1000 MCG/ML IJ SOLN
1000.0000 ug | Freq: Once | INTRAMUSCULAR | Status: AC
  Administered 2017-08-16: 1000 ug via INTRAMUSCULAR

## 2017-08-16 NOTE — Progress Notes (Signed)
B12 injection to Left Deltoid. Patient tolerated injection well.

## 2017-08-21 ENCOUNTER — Other Ambulatory Visit: Payer: Self-pay | Admitting: Neurology

## 2017-08-29 ENCOUNTER — Telehealth: Payer: Self-pay | Admitting: Family Medicine

## 2017-08-29 NOTE — Telephone Encounter (Signed)
Left pt message asking to call Ebony Hail back directly at [phone] to schedule AWV + labs with Katha Cabal and CPE with PCP.  *NOTE* Last AWV 08/03/16; pt due anytime for AWV

## 2017-09-04 ENCOUNTER — Encounter: Payer: Self-pay | Admitting: Internal Medicine

## 2017-09-04 ENCOUNTER — Ambulatory Visit (INDEPENDENT_AMBULATORY_CARE_PROVIDER_SITE_OTHER): Payer: Medicare Other | Admitting: Internal Medicine

## 2017-09-04 VITALS — BP 94/60 | HR 67 | Ht 65.5 in | Wt 157.0 lb

## 2017-09-04 DIAGNOSIS — R05 Cough: Secondary | ICD-10-CM

## 2017-09-04 DIAGNOSIS — R053 Chronic cough: Secondary | ICD-10-CM

## 2017-09-04 MED ORDER — PANTOPRAZOLE SODIUM 40 MG PO TBEC: 40.0000 mg | DELAYED_RELEASE_TABLET | Freq: Every day | ORAL | 2 refills | Status: DC

## 2017-09-04 MED ORDER — FAMOTIDINE 20 MG PO TABS: ORAL_TABLET | ORAL | 2 refills | Status: DC

## 2017-09-04 MED ORDER — TRAMADOL HCL 50 MG PO TABS: ORAL_TABLET | ORAL | 0 refills | Status: DC

## 2017-09-04 MED ORDER — GABAPENTIN 100 MG PO CAPS: 100.0000 mg | ORAL_CAPSULE | Freq: Three times a day (TID) | ORAL | 1 refills | Status: DC

## 2017-09-04 NOTE — Patient Instructions (Addendum)
Change gabapentin to 100 mg three times daily   Pantoprazole (protonix) 40 mg   Take  30-60 min before first meal of the day and Pepcid (famotidine)  20 mg one after supper or before  bedtime until return to office - this is the best way to tell whether stomach acid is contributing to your problem.    GERD (REFLUX)  is an extremely common cause of respiratory symptoms just like yours , many times with no obvious heartburn at all.    It can be treated with medication, but also with lifestyle changes including elevation of the head of your bed (ideally with 6 inch  bed blocks),  Smoking cessation, avoidance of late meals, excessive alcohol, and avoid fatty foods, chocolate, peppermint, colas, red wine, and acidic juices such as orange juice.  NO MINT OR MENTHOL PRODUCTS SO NO COUGH DROPS USE SUGARLESS CANDY INSTEAD (Jolley ranchers or Stover's or Life Savers) or even ice chips will also do - the key is to swallow to prevent all throat clearing. NO OIL BASED VITAMINS - use powdered substitutes.  If can't sop coughing >  Delsym 2 tsp every 12 hours and supplement Tramadol 50 mg up to  1 every 4 hours as needed     Please see patient coordinator before you leave today  to schedule modified barium swallow    Please schedule a follow up office visit in 4 weeks, sooner if needed  with all medications /inhalers/ solutions in hand so we can verify exactly what you are taking. This includes all medications from all doctors and over the counters

## 2017-09-04 NOTE — Progress Notes (Signed)
Subjective:     Patient ID: [name], female   DOB: Apr 17, 1946,     MRN: [id number]  HPI  74 yowf never smoker never very robust as child always the last one off the playground, never did sports and very sedentary as adult but 2009 developed swallowing problem after  admit with dx sepsis ? From uti and ever since dysphagia solid foods and   Parkinson's dz around 2012 and doing PT since 2014 but never tol aerobics and then persistent  cough onset winter  2018 so referred to pulmonary clinic 09/04/2017 by Dr   Leo Grosser   09/04/2017 1st [facility] office visit/ Masin Shatto   Chief Complaint  Patient presents with  . Pulmonary Consult    Referred by Dr. [name]. Pt states coughing for at least the past 6 months. Cough is non prod and sometimes coughs until she loses urinary continence. She states she can't eat meat anymore b/c "it comes back up" and water gets her "strangled" at times.   coughing 24/7 but worse after supper > coughs up meat / sometimes spaghetti or frank vomiting / choking  ppi and pred 07/16/17 no better  As long as not coughing no sob   No obvious day to day or daytime variability or assoc excess/ purulent sputum or mucus plugs or hemoptysis or cp or chest tightness, subjective wheeze or overt sinus or hb symptoms. No unusual exp hx or h/o childhood pna/ asthma or knowledge of premature birth.  Sleeping ok propped up maybe 30 degrees without nocturnal  or early am exacerbation  of respiratory  c/o's or need for noct saba. Also denies any obvious fluctuation of symptoms with weather or environmental changes or other aggravating or alleviating factors except as outlined above   Current Allergies, Complete Past Medical History, Past Surgical History, Family History, and Social History were reviewed in [employer] record.  ROS  The following are not active complaints unless bolded Hoarseness, sore throat, dysphagia, dental problems, itching,  sneezing,  nasal congestion or discharge of excess mucus or purulent secretions, ear ache,   fever, chills, sweats, unintended wt loss or wt gain, classically pleuritic or exertional cp,  orthopnea pnd or leg swelling, presyncope, palpitations, abdominal pain, anorexia, nausea, vomiting, diarrhea  or change in bowel habits or change in bladder habits, change in stools or change in urine, dysuria, hematuria,  rash, arthralgias, visual complaints, headache, numbness, weakness or ataxia or problems with walking or coordination,  change in mood/affect or memory.        Current Meds  Medication Sig  . Calcium Carb-Cholecalciferol (CALCIUM + D3) 600-200 MG-UNIT TABS Take 2 tablets by mouth daily.  . carbidopa-levodopa (SINEMET CR) 50-200 MG tablet TAKE 1 TABLET BY MOUTH AT BEDTIME.  . carbidopa-levodopa (SINEMET IR) 25-100 MG tablet TAKE 1 TABLET BY MOUTH 3 (THREE) TIMES DAILY.  . cholecalciferol (VITAMIN D) 1000 UNITS tablet Take 1,000 Units by mouth daily.  . clonazePAM (KLONOPIN) 0.5 MG tablet TAKE 1 TABLET BY MOUTH TWICE A DAY AS NEEDED  . cyanocobalamin (,VITAMIN B-12,) 1000 MCG/ML injection Inject 1 mL (1,000 mcg total) into the muscle every 30 (thirty) days.  . Elastic Bandages & Supports (ABDOMINAL BINDER/ELASTIC MED) MISC 1 Device by Does not apply route daily.  Marland Kitchen gabapentin (NEURONTIN) 100 MG capsule TAKE 1 CAPSULE (100 MG TOTAL) BY MOUTH AT BEDTIME.  Marland Kitchen latanoprost (XALATAN) 0.005 % ophthalmic solution Place 1 drop into both eyes at bedtime.  . Melatonin 3 MG  CAPS Take 3 mg by mouth at bedtime.  Marland Kitchen MYRBETRIQ 50 MG TB24 tablet Take 50 mg by mouth daily.  . polyethylene glycol (GLYCOLAX) packet Take 17 g by mouth as needed.    . senna (SENOKOT) 8.6 MG tablet Take 1 tablet by mouth daily.    . sertraline (ZOLOFT) 100 MG tablet TAKE 1 TABLET (100 MG TOTAL) BY MOUTH DAILY.  . traMADol (ULTRAM) 50 MG tablet TAKE 1 TABLET BY MOUTH 3 TIMES A DAY AS NEEDED  . triamcinolone cream (KENALOG) 0.5 % Apply  to affected area twice daily for no more than 2 weeks at a time           Review of Systems     Objective:   Physical Exam amb wf nad with freq throat clearing   Wt Readings from Last 3 Encounters:  09/04/17 157 lb (71.2 kg)  07/16/17 161 lb 4 oz (73.1 kg)  07/13/17 162 lb (73.5 kg)    Vital signs reviewed   HEENT: nl dentition, turbinates bilaterally, and oropharynx. Nl external ear canals without cough reflex   NECK :  without JVD/Nodes/TM/ nl carotid upstrokes bilaterally   LUNGS: no acc muscle use,  Nl contour chest which is clear to A and P bilaterally without cough on insp or exp maneuvers   CV:  RRR  no s3 or murmur or increase in P2, and no edema   ABD:  soft and nontender with nl inspiratory excursion in the supine position. No bruits or organomegaly appreciated, bowel sounds nl  MS:  Nl gait/ ext warm without deformities, calf tenderness, cyanosis or clubbing No obvious joint restrictions   SKIN: warm and dry without lesions    NEURO:  alert, approp, nl sensorium with  no motor or cerebellar deficits apparent. Typical resting tremor including jaw      I personally reviewed images and agree with radiology impression as follows:  CXR:   07/16/17  The biapical pleuroparenchymal thickening is most consistent with scarring. No nodules or masses. No focal infiltrates. The heart, hila, and mediastinum are normal. No other acute abnormalities identified.        Assessment:

## 2017-09-05 ENCOUNTER — Other Ambulatory Visit (HOSPITAL_COMMUNITY): Payer: Self-pay | Admitting: Internal Medicine

## 2017-09-05 DIAGNOSIS — R1310 Dysphagia, unspecified: Secondary | ICD-10-CM

## 2017-09-05 NOTE — Assessment & Plan Note (Signed)
Increased gabapentin to 100 tid from 100 qhs 09/04/2017  Plus max gerd rx  - 09/04/2017 MBS pending    The most common causes of chronic cough in immunocompetent adults include the following: upper airway cough syndrome (UACS), previously referred to as postnasal drip syndrome (PNDS), which is caused by variety of rhinosinus conditions; (2) asthma; (3) GERD( which has been treated with ppi though not clear pt used this correctly or followed dietary restrictions (4) chronic bronchitis from cigarette smoking or other inhaled environmental irritants; (5) nonasthmatic eosinophilic bronchitis; and (6) bronchiectasis.   These conditions, singly or in combination, have accounted for up to 94% of the causes of chronic cough in prospective studies.   Other conditions have constituted no >6% of the causes in prospective studies These have included bronchogenic carcinoma, chronic interstitial pneumonia, sarcoidosis, left ventricular failure, ACEI-induced cough, and aspiration from a condition associated with pharyngeal dysfunction.    Chronic cough is often simultaneously caused by more than one condition. A single cause has been found from 38 to 82% of the time, multiple causes from 18 to 62%. Multiply caused cough has been the result of three diseases up to 42% of the time.    Mostly likely this is a form of Upper airway cough syndrome (previously labeled PNDS),  is so named because it's frequently impossible to sort out how much is  CR/sinusitis with freq throat clearing (which can be related to primary GERD)   vs  causing  secondary (" extra esophageal")  GERD from wide swings in gastric pressure that occur with throat clearing, often  promoting self use of mint and menthol lozenges that reduce the lower esophageal sphincter tone and exacerbate the problem further in a cyclical fashion.   These are the same pts (now being labeled as having "irritable larynx syndrome" by some cough centers) who not infrequently  have a history of having failed to tolerate ace inhibitors,  dry powder inhalers or biphosphonates or report having atypical/extraesophageal reflux symptoms that don't respond to standard doses of PPI  and are easily confused as having aecopd or asthma flares by even experienced allergists/ pulmonologists (myself included).   The throat clearing from the uacs and asp may be causing a cyclical cough where she has secondary gerd from the cough itself and need to try to address all the concerns at once    rec :  Max rx for gerd both acid and non acid plus push gabapentin harder and eliminate cylcial cough with tramadol prn as last resort then f/u with MBS when available    Total time devoted to counseling  > 50 % of initial 60 min office visit:  review case with pt/ discussion of options/alternatives/ personally creating written customized instructions  in presence of pt  then going over those specific  Instructions directly with the pt including how to use all of the meds but in particular covering each new medication in detail and the difference between the maintenance= "automatic" meds and the prns using an action plan format for the latter (If this problem/symptom => do that organization reading Left to right).  Please see AVS from this visit for a full list of these instructions which I personally wrote for this pt and  are unique to this visit.

## 2017-09-05 NOTE — Telephone Encounter (Signed)
Scheduled 09/18/17

## 2017-09-11 ENCOUNTER — Ambulatory Visit (HOSPITAL_COMMUNITY)
Admission: RE | Admit: 2017-09-11 | Discharge: 2017-09-11 | Disposition: A | Discharge status: Home / Self Care | Payer: Medicare Other | Source: Ambulatory Visit | Attending: Internal Medicine | Admitting: Internal Medicine

## 2017-09-11 ENCOUNTER — Telehealth: Payer: Self-pay | Admitting: Internal Medicine

## 2017-09-11 DIAGNOSIS — R05 Cough: Secondary | ICD-10-CM | POA: Diagnosis not present

## 2017-09-11 DIAGNOSIS — R053 Chronic cough: Secondary | ICD-10-CM

## 2017-09-11 DIAGNOSIS — R1310 Dysphagia, unspecified: Secondary | ICD-10-CM | POA: Diagnosis not present

## 2017-09-11 NOTE — Telephone Encounter (Signed)
Called and spoke with pt and she stated that she feels that she is not getting better.  She is having coughing spells and if she is out, she has to get up and leave due to this.  appt scheduled with MW on 10/19 at 130.

## 2017-09-11 NOTE — Progress Notes (Signed)
Modified Barium Swallow Progress Note  Patient Details  Name: [name] MRN: [id number] Date of Birth: 08-31-1946  Today's Date: 09/11/2017  Modified Barium Swallow completed.  Full report located under Chart Review in the Imaging Section.  Brief recommendations include the following:  Clinical Impression  Pt's oropharyngeal swallow is [facility] with no airway compromise despite frequent coughing immediately psot-swallow. Pt does have what appears to be a prominent CP with some reduced flow through the UES and small amounts of backflow into the pharynx. Barium appeared to move slowly through the esophagus, particularly with solids/purees administered. No SLP f/u indicated, although MD may wish to consider additional esophageal w/u.   Swallow Evaluation Recommendations   Recommended Consults: Consider GI evaluation;Consider esophageal assessment   SLP Diet Recommendations: Regular solids;Thin liquid   Liquid Administration via: Cup;Straw   Medication Administration: Whole meds with liquid   Supervision: Patient able to self feed   Compensations: Slow rate;Small sips/bites;Follow solids with liquid   Postural Changes: Remain semi-upright after after feeds/meals (Comment);Seated upright at 90 degrees   Oral Care Recommendations: Oral care BID        Germain Osgood 09/11/2017,12:32 PM   Germain Osgood, M.A. CCC-SLP [phone]

## 2017-09-11 NOTE — Progress Notes (Signed)
LMTCB

## 2017-09-12 ENCOUNTER — Other Ambulatory Visit: Payer: Self-pay | Admitting: Neurology

## 2017-09-13 ENCOUNTER — Telehealth: Payer: Self-pay | Admitting: Neurology

## 2017-09-13 NOTE — Telephone Encounter (Signed)
Pt called and left a voicemail message asking about the dosage for pramipexole

## 2017-09-13 NOTE — Telephone Encounter (Signed)
Confirmed patient should be taking Pramipexole 1 tablet TID. She will call with any other questions.

## 2017-09-14 ENCOUNTER — Ambulatory Visit (INDEPENDENT_AMBULATORY_CARE_PROVIDER_SITE_OTHER): Payer: Medicare Other | Admitting: Internal Medicine

## 2017-09-14 ENCOUNTER — Encounter: Payer: Self-pay | Admitting: Internal Medicine

## 2017-09-14 VITALS — BP 102/60 | HR 64 | Ht 65.5 in | Wt 156.0 lb

## 2017-09-14 DIAGNOSIS — R05 Cough: Secondary | ICD-10-CM | POA: Diagnosis not present

## 2017-09-14 DIAGNOSIS — R053 Chronic cough: Secondary | ICD-10-CM

## 2017-09-14 NOTE — Patient Instructions (Signed)
Change gabapentin to 100 mg with bfast/ 100 mg at bedtime then add 2nd one at bedtime   For drainage / throat tickle try take CHLORPHENIRAMINE  4 mg - take one every 4 hours as needed - available over the counter- may cause drowsiness so start with just a evening dose (one hour before bed)  and see how you tolerate it before trying in daytime    Change pepcid to where you take it after supper  Take delsym two tsp every 12 hours and supplement if needed with  tramadol 50 mg up to 1-2 every 4 hours to suppress the urge to cough. Swallowing water or using ice chips/non mint and menthol containing candies (such as lifesavers or sugarless jolly ranchers) are also effective.  You should rest your voice and avoid activities that you know make you cough.  Once you have eliminated the cough for 3 straight days try reducing the tramadol first,  then the delsym as tolerated.     Please see patient coordinator before you leave today  to schedule Dg Esophagram   Please schedule a follow up office visit in 4 weeks, sooner if needed

## 2017-09-14 NOTE — Assessment & Plan Note (Addendum)
Increased gabapentin to 100 tid from 100 qhs 09/04/2017  Plus max gerd rx > could not tol -  MBS 09/11/2017  Ok, rec barium swallow    Lack of cough resolution on a verified empirical regimen could mean an alternative diagnosis, persistence of the disease state (eg sinusitis or bronchiectasis) , or inadequacy of currently available therapy (eg no medical rx available for non-acid gerd)    - DgEs 09/14/2017 next step - in meantime try 1st gen H1 blockers per guidelines  For uacs and eliminate cyclical coughing with tramadol/ try to work up toward gabapentin 300 mg daily   I had an extended discussion with the patient reviewing all relevant studies completed to date and  lasting 15 to 20 minutes of a 25 minute visit    Each maintenance medication was reviewed in detail including most importantly the difference between maintenance and prns and under what circumstances the prns are to be triggered using an action plan format that is not reflected in the computer generated alphabetically organized AVS.    Please see AVS for specific instructions unique to this visit that I personally wrote and verbalized to the the pt in detail and then reviewed with pt  by my nurse highlighting any  changes in therapy recommended at today's visit to their plan of care.

## 2017-09-14 NOTE — Progress Notes (Signed)
Subjective:     Patient ID: [name], female   DOB: 1946/11/01,     MRN: [id number]   Brief patient profile:  4 yowf never smoker never very robust as child always the last one off the playground, never did sports and very sedentary as adult but 2009 developed swallowing problem after  admit with dx sepsis ? From uti and ever since dysphagia solid foods and   Parkinson's dz around 2012 and doing PT since 2014 but never tol aerobics and then persistent  cough onset winter  2018 so referred to pulmonary clinic 09/04/2017 by Dr   Leo Grosser    History of Present Illness  09/04/2017 1st [facility] office visit/ [facility]   Chief Complaint  Patient presents with  . Pulmonary Consult    Referred by Dr. [name]. Pt states coughing for at least the past 6 months. Cough is non prod and sometimes coughs until she loses urinary continence. She states she can't eat meat anymore b/c "it comes back up" and water gets her "strangled" at times.   coughing 24/7 but worse after supper > coughs up meat / sometimes spaghetti or frank vomiting / choking  ppi and pred 07/16/17 no better  As long as not coughing no sob  rec Change gabapentin to 100 mg three times daily  Pantoprazole (protonix) 40 mg   Take  30-60 min before first meal of the day and Pepcid (famotidine)  20 mg one after supper or before  bedtime until return to office  GERD diet  If can't sop coughing >  Delsym 2 tsp every 12 hours and supplement Tramadol 50 mg up to  1 every 4 hours as needed   Please see patient coordinator before you leave today  to schedule modified barium swallow > -  MBS 09/11/2017  Ok, rec barium swallow      09/14/2017  f/u ov/[facility] re: cough x  Winter 2018  Chief Complaint  Patient presents with  . Follow-up    Cough is unchanged.   changed 100 mg to 100 mt tid but couldn't tolerate that does so resumed 100 mg at bedtime  Only  Did not eliminate coughing with tramadol, only used max of 2 per day  Ranks  everything that's been done as "zero" per cent effective  Unless coughing, Not limited by breathing from desired activities   Assoc with overt HB despite taking ppi qd ac and pepcid qhs    No obvious day to day or daytime variability or assoc excess/ purulent sputum or mucus plugs or hemoptysis or cp or chest tightness, subjective wheeze or overt sinus  symptoms. No unusual exp hx or h/o childhood pna/ asthma or knowledge of premature birth.  Sleeping ok flat without nocturnal  or early am exacerbation  of respiratory  c/o's or need for noct saba. Also denies any obvious fluctuation of symptoms with weather or environmental changes or other aggravating or alleviating factors except as outlined above   Current Allergies, Complete Past Medical History, Past Surgical History, Family History, and Social History were reviewed in [employer] record.  ROS  The following are not active complaints unless bolded Hoarseness, sore throat, dysphagia, dental problems, itching, sneezing,  nasal congestion or discharge of excess mucus or purulent secretions, ear ache,   fever, chills, sweats, unintended wt loss or wt gain, classically pleuritic or exertional cp,  orthopnea pnd or leg swelling, presyncope, palpitations, abdominal pain, anorexia, nausea, vomiting, diarrhea  or change  in bowel habits or change in bladder habits, change in stools or change in urine, dysuria, hematuria,  rash, arthralgias, visual complaints, headache, numbness, weakness or ataxia or problems with walking or coordination,  change in mood/affect or memory.        Current Meds  Medication Sig  . carbidopa-levodopa (SINEMET CR) 50-200 MG tablet TAKE 1 TABLET BY MOUTH AT BEDTIME.  . carbidopa-levodopa (SINEMET IR) 25-100 MG tablet Take 1.5 tablets by mouth 3 (three) times daily.  . clonazePAM (KLONOPIN) 0.5 MG tablet TAKE 1 TABLET BY MOUTH TWICE A DAY AS NEEDED  . cyanocobalamin (,VITAMIN B-12,) 1000 MCG/ML  injection Inject 1 mL (1,000 mcg total) into the muscle every 30 (thirty) days.  . Droxidopa (NORTHERA) 300 MG CAPS Take 2 capsules by mouth 3 (three) times daily.  . famotidine (PEPCID) 20 MG tablet One at bedtime  . gabapentin (NEURONTIN) 100 MG capsule Take 1 capsule (100 mg total) by mouth 3 (three) times daily. (Patient taking differently: Take 100 mg by mouth 4 (four) times daily. )  . Melatonin 3 MG CAPS Take 3 mg by mouth at bedtime.  Marland Kitchen MYRBETRIQ 50 MG TB24 tablet Take 50 mg by mouth daily.  . pantoprazole (PROTONIX) 40 MG tablet Take 1 tablet (40 mg total) by mouth daily. Take 30-60 min before first meal of the day  . polyethylene glycol (GLYCOLAX) packet Take 17 g by mouth as needed.    . pramipexole (MIRAPEX) 1 MG tablet TAKE 1 TABLET (1 MG TOTAL) BY MOUTH 3 (THREE) TIMES DAILY.  Marland Kitchen sertraline (ZOLOFT) 100 MG tablet TAKE 1 TABLET (100 MG TOTAL) BY MOUTH DAILY.  Marland Kitchen timolol (BETIMOL) 0.5 % ophthalmic solution 1 drop 2 (two) times daily.  . traMADol (ULTRAM) 50 MG tablet 1-2 every 4 hours as needed for cough or pain  . triamcinolone cream (KENALOG) 0.5 % Apply to affected area twice daily for no more than 2 weeks at a time            Objective:   Physical Exam   amb wf nad     09/14/2017    156   09/04/17 157 lb (71.2 kg)  07/16/17 161 lb 4 oz (73.1 kg)  07/13/17 162 lb (73.5 kg)    Vital signs reviewed  - Note on arrival 02 sats  98% on RA     HEENT: nl dentition, turbinates bilaterally, and oropharynx. Nl external ear canals without cough reflex   NECK :  without JVD/Nodes/TM/ nl carotid upstrokes bilaterally   LUNGS: no acc muscle use,  Nl contour chest which is clear to A and P bilaterally with cough on insp   maneuvers   CV:  RRR  no s3 or murmur or increase in P2, and no edema   ABD:  soft and nontender with nl inspiratory excursion in the supine position. No bruits or organomegaly appreciated, bowel sounds nl  MS:  Nl gait/ ext warm without deformities, calf  tenderness, cyanosis or clubbing No obvious joint restrictions   SKIN: warm and dry without lesions    NEURO:  alert, approp, nl sensorium with  no motor or cerebellar deficits apparent. Typical resting tremor including jaw      I personally reviewed images and agree with radiology impression as follows:  CXR:   07/16/17  The biapical pleuroparenchymal thickening is most consistent with scarring. No nodules or masses. No focal infiltrates. The heart, hila, and mediastinum are normal. No other acute abnormalities identified.  Assessment:

## 2017-09-17 ENCOUNTER — Other Ambulatory Visit: Payer: Self-pay | Admitting: Family Medicine

## 2017-09-17 DIAGNOSIS — D649 Anemia, unspecified: Secondary | ICD-10-CM

## 2017-09-17 DIAGNOSIS — E785 Hyperlipidemia, unspecified: Secondary | ICD-10-CM

## 2017-09-18 ENCOUNTER — Ambulatory Visit (INDEPENDENT_AMBULATORY_CARE_PROVIDER_SITE_OTHER): Payer: Medicare Other

## 2017-09-18 VITALS — BP 98/64 | HR 61 | Temp 97.5°F

## 2017-09-18 DIAGNOSIS — Z0000 Encounter for general adult medical examination without abnormal findings: Secondary | ICD-10-CM | POA: Diagnosis not present

## 2017-09-18 DIAGNOSIS — D649 Anemia, unspecified: Secondary | ICD-10-CM | POA: Diagnosis not present

## 2017-09-18 DIAGNOSIS — E538 Deficiency of other specified B group vitamins: Secondary | ICD-10-CM | POA: Diagnosis not present

## 2017-09-18 DIAGNOSIS — E785 Hyperlipidemia, unspecified: Secondary | ICD-10-CM

## 2017-09-18 DIAGNOSIS — Z23 Encounter for immunization: Secondary | ICD-10-CM | POA: Diagnosis not present

## 2017-09-18 LAB — LIPID PANEL
CHOLESTEROL: 180 mg/dL (ref 0–200)
HDL: 57.6 mg/dL (ref >39.00)
LDL CALC: 107 mg/dL — AB (ref 0–99)
NonHDL: 122.31
Total CHOL/HDL Ratio: 3
Triglycerides: 76 mg/dL (ref 0.0–149.0)
VLDL: 15.2 mg/dL (ref 0.0–40.0)

## 2017-09-18 LAB — CBC WITH DIFFERENTIAL/PLATELET
BASOS PCT: 0.9 % (ref 0.0–3.0)
Basophils Absolute: 0 10*3/uL (ref 0.0–0.1)
EOS ABS: 0.1 10*3/uL (ref 0.0–0.7)
EOS PCT: 2.9 % (ref 0.0–5.0)
HEMATOCRIT: 36 % (ref 36.0–46.0)
HEMOGLOBIN: 11.6 g/dL — AB (ref 12.0–15.0)
LYMPHS PCT: 41.2 % (ref 12.0–46.0)
Lymphs Abs: 1.9 10*3/uL (ref 0.7–4.0)
MCHC: 32.2 g/dL (ref 30.0–36.0)
MCV: 82.7 fl (ref 78.0–100.0)
Monocytes Absolute: 0.3 10*3/uL (ref 0.1–1.0)
Monocytes Relative: 6.6 % (ref 3.0–12.0)
Neutro Abs: 2.2 10*3/uL (ref 1.4–7.7)
Neutrophils Relative %: 48.4 % (ref 43.0–77.0)
Platelets: 256 10*3/uL (ref 150.0–400.0)
RBC: 4.35 Mil/uL (ref 3.87–5.11)
RDW: 17.3 % — AB (ref 11.5–15.5)
WBC: 4.6 10*3/uL (ref 4.0–10.5)

## 2017-09-18 LAB — BASIC METABOLIC PANEL
BUN: 15 mg/dL (ref 6–23)
CO2: 31 mEq/L (ref 19–32)
Calcium: 9.7 mg/dL (ref 8.4–10.5)
Chloride: 103 mEq/L (ref 96–112)
Creatinine, Ser: 0.77 mg/dL (ref 0.40–1.20)
GFR: 78.95 mL/min (ref >60.00)
GLUCOSE: 100 mg/dL — AB (ref 70–99)
POTASSIUM: 4.8 meq/L (ref 3.5–5.1)
Sodium: 141 mEq/L (ref 135–145)

## 2017-09-18 LAB — FERRITIN: Ferritin: 57.7 ng/mL (ref 10.0–291.0)

## 2017-09-18 MED ORDER — CYANOCOBALAMIN 1000 MCG/ML IJ SOLN
1000.0000 ug | Freq: Once | INTRAMUSCULAR | Status: AC
  Administered 2017-09-18: 1000 ug via INTRAMUSCULAR

## 2017-09-18 NOTE — Progress Notes (Signed)
Pre visit review using our clinic review tool, if applicable. No additional management support is needed unless otherwise documented below in the visit note. 

## 2017-09-18 NOTE — Progress Notes (Signed)
Subjective:   [name] is a 71 y.o. female who presents for Medicare Annual (Subsequent) preventive examination.  Review of Systems:  N/A Cardiac Risk Factors include: advanced age (>36men, >11 women)     Objective:     Vitals: BP 98/64 (BP Location: Right Arm, Patient Position: Sitting, Cuff Size: Normal)   Pulse 61   Temp (!) 97.5 F (36.4 C) (Oral)   SpO2 98%   There is no height or weight on file to calculate BMI.   Tobacco History  Smoking Status  . Never Smoker  Smokeless Tobacco  . Never Used     Counseling given: No   Past Medical History:  Diagnosis Date  . ANEMIA-NOS 09/25/2007  . B12 DEFICIENCY 05/03/2007  . CAD (coronary artery disease) 01/2010   MI, Nishan  . Cardiomyopathy ([place]) 01/2010  . Cardiomyopathy ([place]) 02/08/2010   H/o this 2012 after urosepsis, no recurrence.   Marland Kitchen FIBROMYALGIA 05/03/2007  . GERD 02/22/2010  . Glaucoma 02/2013   [facility]  . History of CHF (congestive heart failure) 01/2010  . History of colon polyps 2004  . HYPERLIPIDEMIA 12/19/2007  . HYPOTENSION, ORTHOSTATIC 12/06/2008  . Interstitial cystitis    [place] now Dr Amalia Hailey  . Lupus (systemic lupus erythematosus) ([place]) 02/08/2010  . MCTD (mixed connective tissue disease) ([place]) 02/08/2010  . OSTEOPOROSIS 08/2009   bisphosphonate on hold 2/2 dysphagia, on reclast done in August each year  . Parkinson's disease ([place]) 08/25/2015   Dx Dr Carles Collet 07/2015   . 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 Takotsubo cardiomyopathy 2008   due to E coli urosepsis   Past Surgical History:  Procedure Laterality Date  . ABDOMINAL HYSTERECTOMY  1970s   IUD infection - first partial then with oophorectomy (cysts), complication - low blood pressure  . CATARACT EXTRACTION Right   . CHOLECYSTECTOMY     complication - low blood pressure  . COLONOSCOPY  06/2008   h/o polyps but latest WNL, rec rpt 10 yrs Olevia Perches)  . CYSTOSCOPY  12/2013   abx treatment for recurrent cystitis    . DEXA  04/2013   T -2.9 @ femur, -1.6 @ spine  . DEXA  04/2017   T -2.9 hip, -0.7 spine   Family History  Problem Relation Age of Onset  . Heart attack Father   . Diabetes Father   . Prostate cancer Father   . Cancer Mother        esophageal  . Ovarian cancer Sister   . Uterine cancer Sister   . Breast cancer Sister   . Colitis Sister   . Lung cancer Brother   . CAD Brother    History  Sexual Activity  . Sexual activity: No    Outpatient Encounter Prescriptions as of 09/18/2017  Medication Sig  . carbidopa-levodopa (SINEMET CR) 50-200 MG tablet TAKE 1 TABLET BY MOUTH AT BEDTIME.  . carbidopa-levodopa (SINEMET IR) 25-100 MG tablet Take 1.5 tablets by mouth 3 (three) times daily.  . clonazePAM (KLONOPIN) 0.5 MG tablet TAKE 1 TABLET BY MOUTH TWICE A DAY AS NEEDED  . cyanocobalamin (,VITAMIN B-12,) 1000 MCG/ML injection Inject 1 mL (1,000 mcg total) into the muscle every 30 (thirty) days.  . Droxidopa (NORTHERA) 300 MG CAPS Take 2 capsules by mouth 3 (three) times daily.  . famotidine (PEPCID) 20 MG tablet One at bedtime  . gabapentin (NEURONTIN) 100 MG capsule Take 1 capsule (100 mg total) by mouth 3 (  three) times daily. (Patient taking differently: Take 100 mg by mouth 2 (two) times daily. )  . MYRBETRIQ 50 MG TB24 tablet Take 50 mg by mouth daily.  . pantoprazole (PROTONIX) 40 MG tablet Take 1 tablet (40 mg total) by mouth daily. Take 30-60 min before first meal of the day  . polyethylene glycol (GLYCOLAX) packet Take 17 g by mouth as needed.    . pramipexole (MIRAPEX) 1 MG tablet TAKE 1 TABLET (1 MG TOTAL) BY MOUTH 3 (THREE) TIMES DAILY.  Marland Kitchen sertraline (ZOLOFT) 100 MG tablet TAKE 1 TABLET (100 MG TOTAL) BY MOUTH DAILY.  Marland Kitchen timolol (BETIMOL) 0.5 % ophthalmic solution 1 drop 2 (two) times daily.  . traMADol (ULTRAM) 50 MG tablet 1-2 every 4 hours as needed for cough or pain  . triamcinolone cream (KENALOG) 0.5 % Apply to affected area twice daily for no more than 2 weeks at a  time (Patient taking differently: as needed. Apply to affected area twice daily for no more than 2 weeks at a time)  . [DISCONTINUED] Melatonin 3 MG CAPS Take 3 mg by mouth at bedtime.  . [EXPIRED] cyanocobalamin ((VITAMIN B-12)) injection 1,000 mcg    No facility-administered encounter medications on file as of 09/18/2017.     Activities of Daily Living In your present state of health, do you have any difficulty performing the following activities: 09/18/2017  Hearing? N  Vision? N  Difficulty concentrating or making decisions? N  Walking or climbing stairs? N  Dressing or bathing? N  Doing errands, shopping? N  Preparing Food and eating ? N  Using the Toilet? N  In the past six months, have you accidently leaked urine? Y  Do you have problems with loss of bowel control? N  Managing your Medications? N  Managing your Finances? N  Housekeeping or managing your Housekeeping? N  Some recent data might be hidden    Patient Care Team: [name], MD as PCP - General (Family Medicine) Domingo Pulse, MD (Urology) [name], Eustace Quail, DO as Consulting Physician (Neurology) Karren Burly Deirdre Peer, MD as Referring Physician (Ophthalmology)    Assessment:    Hearing Screening Comments: Last hearing screen on 11/04/16; results in EPIC Vision Screening Comments: Last vision exam in July 2018 with Dr. Sabra Heck' Exercise Activities and Dietary recommendations Current Exercise Habits: Home exercise routine, Type of exercise: walking, Time (Minutes): 10, Frequency (Times/Week): 7, Weekly Exercise (Minutes/Week): 70, Intensity: Mild, Exercise limited by: respiratory conditions(s)  Goals    . Increase physical activity          As weather permits, I will continue to walk at least 30 min daily.       Fall Risk Fall Risk  09/18/2017 07/13/2017 12/28/2016 11/14/2016 10/06/2016  Falls in the past year? No No Yes Yes No  Comment - - - FELL IN THE ICE ABOUT 2 WEEKS AGO -  Number falls in  past yr: - - 1 1 -  Injury with Fall? - - No - -  Follow up - - Falls evaluation completed - -   Depression Screen PHQ 2/9 Scores 09/18/2017 11/14/2016 08/03/2016 05/26/2015  PHQ - 2 Score 2 0 0 0  PHQ- 9 Score 7 - - -     Cognitive Function MMSE - Mini Mental State Exam 09/18/2017 08/03/2016  Orientation to time 5 5  Orientation to Place 5 5  Registration 3 3  Attention/ Calculation 0 0  Recall 3 3  Language- name 2 objects 0 0  Language- repeat 1 1  Language- follow 3 step command 3 3  Language- read & follow direction 0 0  Write a sentence 0 0  Copy design 0 0  Total score 20 20       PLEASE NOTE: A Mini-Cog screen was completed. Maximum score is 20. A value of 0 denotes this part of Folstein MMSE was not completed or the patient failed this part of the Mini-Cog screening.   Mini-Cog Screening Orientation to Time - Max 5 pts Orientation to Place - Max 5 pts Registration - Max 3 pts Recall - Max 3 pts Language Repeat - Max 1 pts Language Follow 3 Step Command - Max 3 pts   Immunization History  Administered Date(s) Administered  . Influenza Split 08/18/2011, 10/11/2012  . Influenza Whole 09/25/2007, 09/08/2008, 08/23/2009, 09/02/2010  . Influenza,inj,Quad PF,6+ Mos 09/02/2013, 08/13/2014, 10/28/2015, 11/14/2016, 09/18/2017  . Pneumococcal Conjugate-13 05/21/2014  . Pneumococcal Polysaccharide-23 05/26/2015  . Tdap 08/18/2011  . Zoster 10/12/2010   Screening Tests Health Maintenance  Topic Date Due  . MAMMOGRAM  05/24/2018  . COLONOSCOPY  06/30/2018  . DTaP/Tdap/Td (2 - Td) 08/17/2021  . TETANUS/TDAP  08/17/2021  . INFLUENZA VACCINE  Completed  . DEXA SCAN  Completed  . Hepatitis C Screening  Completed  . PNA vac Low Risk Adult  Completed      Plan:     I have personally reviewed, addressed, and noted the following in the patient's chart:  A. Medical and social history B. Use of alcohol, tobacco or illicit drugs  C. Current medications and  supplements D. Functional ability and status E.  Nutritional status F.  Physical activity G. Advance directives H. List of other physicians I.  Hospitalizations, surgeries, and ER visits in previous 12 months J.  [place] to include hearing, vision, cognitive, depression L. Referrals and appointments - none  In addition, I have reviewed and discussed with patient certain preventive protocols, quality metrics, and best practice recommendations. A written personalized care plan for preventive services as well as general preventive health recommendations were provided to patient.  See attached scanned questionnaire for additional information.   Signed,   Lindell Noe, MHA, BS, LPN Health Coach

## 2017-09-18 NOTE — Patient Instructions (Signed)
[name] , Thank you for taking time to come for your Medicare Wellness Visit. I appreciate your ongoing commitment to your health goals. Please review the following plan we discussed and let me know if I can assist you in the future.   These are the goals we discussed: Goals    . Increase physical activity          As weather permits, I will continue to walk at least 30 min daily.        This is a list of the screening recommended for you and due dates:  Health Maintenance  Topic Date Due  . Mammogram  05/24/2018  . Colon Cancer Screening  06/30/2018  . DTaP/Tdap/Td vaccine (2 - Td) 08/17/2021  . Tetanus Vaccine  08/17/2021  . Flu Shot  Completed  . DEXA scan (bone density measurement)  Completed  .  Hepatitis C: One time screening is recommended by Center for Disease Control  (CDC) for  adults born from [birth year] through 1963.   Completed  . Pneumonia vaccines  Completed   Preventive Care for Adults  A healthy lifestyle and preventive care can promote health and wellness. Preventive health guidelines for adults include the following key practices.  . A routine yearly physical is a good way to check with your health care provider about your health and preventive screening. It is a chance to share any concerns and updates on your health and to receive a thorough exam.  . Visit your dentist for a routine exam and preventive care every 6 months. Brush your teeth twice a day and floss once a day. Good oral hygiene prevents tooth decay and gum disease.  . The frequency of eye exams is based on your age, health, family medical history, use  of contact lenses, and other factors. Follow your health care provider's recommendations for frequency of eye exams.  . Eat a healthy diet. Foods like vegetables, fruits, whole grains, low-fat dairy products, and lean protein foods contain the nutrients you need without too many calories. Decrease your intake of foods high in solid fats, added sugars,  and salt. Eat the right amount of calories for you. Get information about a proper diet from your health care provider, if necessary.  . Regular physical exercise is one of the most important things you can do for your health. Most adults should get at least 150 minutes of moderate-intensity exercise (any activity that increases your heart rate and causes you to sweat) each week. In addition, most adults need muscle-strengthening exercises on 2 or more days a week.  Silver Sneakers may be a benefit available to you. To determine eligibility, you may visit the website: www.silversneakers.com or contact program at 802-124-9986 Mon-Fri between 8AM-8PM.   . Maintain a healthy weight. The body mass index (BMI) is a screening tool to identify possible weight problems. It provides an estimate of body fat based on height and weight. Your health care provider can find your BMI and can help you achieve or maintain a healthy weight.   For adults 20 years and older: ? A BMI below 18.5 is considered underweight. ? A BMI of 18.5 to 24.9 is normal. ? A BMI of 25 to 29.9 is considered overweight. ? A BMI of 30 and above is considered obese.   . Maintain normal blood lipids and cholesterol levels by exercising and minimizing your intake of saturated fat. Eat a balanced diet with plenty of fruit and vegetables. Blood tests for lipids and  cholesterol should begin at age 34 and be repeated every 5 years. If your lipid or cholesterol levels are high, you are over 50, or you are at high risk for heart disease, you may need your cholesterol levels checked more frequently. Ongoing high lipid and cholesterol levels should be treated with medicines if diet and exercise are not working.  . If you smoke, find out from your health care provider how to quit. If you do not use tobacco, please do not start.  . If you choose to drink alcohol, please do not consume more than 2 drinks per day. One drink is considered to be 12  ounces (355 mL) of beer, 5 ounces (148 mL) of wine, or 1.5 ounces (44 mL) of liquor.  . If you are 34-32 years old, ask your health care provider if you should take aspirin to prevent strokes.  . Use sunscreen. Apply sunscreen liberally and repeatedly throughout the day. You should seek shade when your shadow is shorter than you. Protect yourself by wearing long sleeves, pants, a wide-brimmed hat, and sunglasses year round, whenever you are outdoors.  . Once a month, do a whole body skin exam, using a mirror to look at the skin on your back. Tell your health care provider of new moles, moles that have irregular borders, moles that are larger than a pencil eraser, or moles that have changed in shape or color.

## 2017-09-18 NOTE — Progress Notes (Signed)
PCP notes:   Health maintenance:  Flu vaccine - administered  Abnormal screenings:   Depression score: 7  Patient concerns:   None  Nurse concerns:  B12 injection administered.   Next PCP appt:   10/09/17 @ 1515

## 2017-09-20 ENCOUNTER — Encounter: Payer: Self-pay | Admitting: Internal Medicine

## 2017-09-21 ENCOUNTER — Ambulatory Visit (HOSPITAL_COMMUNITY)
Admission: RE | Admit: 2017-09-21 | Discharge: 2017-09-21 | Disposition: A | Discharge status: Home / Self Care | Payer: Medicare Other | Source: Ambulatory Visit | Attending: Internal Medicine | Admitting: Internal Medicine

## 2017-09-21 DIAGNOSIS — R053 Chronic cough: Secondary | ICD-10-CM

## 2017-09-21 DIAGNOSIS — R05 Cough: Secondary | ICD-10-CM | POA: Insufficient documentation

## 2017-09-23 NOTE — Progress Notes (Signed)
I reviewed health advisor's note, was available for consultation, and agree with documentation and plan.  

## 2017-09-24 ENCOUNTER — Telehealth: Payer: Self-pay | Admitting: Internal Medicine

## 2017-09-24 NOTE — Telephone Encounter (Signed)
Patient returned phone cal

## 2017-09-24 NOTE — Telephone Encounter (Signed)
If still coughing yes needs pulmonary f/u to sort out what else we can offer but must bring all meds with her to office to verify exactly what she's taking before we can offer to add any additional meds

## 2017-09-24 NOTE — Telephone Encounter (Signed)
Tanda Rockers, MD sent to Rosana Berger, [place]        Call patient : Study is unremarkable, no change in recs    [facility]

## 2017-09-24 NOTE — Telephone Encounter (Signed)
Spoke with patient. She is aware of results.   She wants to know if she still needs to come back next month since the results were unremarkable. She stated that she is still coughing.   MW, please advise. Thanks!

## 2017-09-25 NOTE — Telephone Encounter (Signed)
Called pt and advised message from the provider. Pt understood and verbalized understanding. Nothing further is needed.    

## 2017-10-02 ENCOUNTER — Ambulatory Visit: Payer: Medicare Other | Admitting: Internal Medicine

## 2017-10-09 ENCOUNTER — Encounter: Payer: Self-pay | Admitting: Physician Assistant

## 2017-10-09 ENCOUNTER — Ambulatory Visit (INDEPENDENT_AMBULATORY_CARE_PROVIDER_SITE_OTHER): Payer: Medicare Other | Admitting: Family Medicine

## 2017-10-09 ENCOUNTER — Encounter: Payer: Self-pay | Admitting: Family Medicine

## 2017-10-09 VITALS — BP 118/64 | HR 78 | Temp 97.8°F | Ht 66.0 in | Wt 158.0 lb

## 2017-10-09 DIAGNOSIS — R1310 Dysphagia, unspecified: Secondary | ICD-10-CM

## 2017-10-09 DIAGNOSIS — R053 Chronic cough: Secondary | ICD-10-CM

## 2017-10-09 DIAGNOSIS — R05 Cough: Secondary | ICD-10-CM

## 2017-10-09 DIAGNOSIS — G20 Parkinson's disease: Secondary | ICD-10-CM

## 2017-10-09 HISTORY — DX: Dysphagia, unspecified: R13.10

## 2017-10-09 NOTE — Progress Notes (Signed)
BP 118/64 (BP Location: Left Arm, Patient Position: Sitting, Cuff Size: Normal)   Pulse 78   Temp 97.8 F (36.6 C) (Oral)   Ht 5\' 6"  (1.676 m)   Wt 158 lb (71.7 kg)   SpO2 97%   BMI 25.50 kg/m    CC: CPE Subjective:    Patient ID: [name], female    DOB: 24-Dec-1945, 70 y.o.   MRN: [id number]  HPI: DONEEN [name] is a 71 y.o. female presenting on 10/09/2017 for Annual Exam (Pt 2) and Cough (has had 6-7 mos. Request referral to GI)   Saw [name] 2 weeks ago for medicare wellness visit. Note reviewed.   Ongoing chronic cough for the past ~9 months, see prior note for details. We referred to pulmonology, notes reviewed. On tramadol and gabapentin for this. MBS unrevealing. Plain esophagram showed intact primary peristalsis of esophagus without appreciable stricture. She endorses ongoing solid food dysphagia, at times with food emesis. She is taking protonix and pepcid daily.   Last EGD 2011.  Last CPE 11/14/2016.  Mother with h/o esophageal cancer.   Started seeing counselor [name] in [place].   Preventative: COLONOSCOPY Date: 06/2008 h/o polyps but latest WNL, rec rpt 10 yrs Olevia Perches).  DEXA Date: 04/2013 T -2.9 at femur, -1.6 at spine. Last reclast 06/2014. Due for rpt.  Mammogram 07/2014 WNL. Gets Q2 yrs. Will call to schedule.  Well woman - s/p hysterectomy and oophorectomy.  Flu yearly prevnar 04/2014, pneumovax 04/2015 Tdap 2012  Zostavax 2011  Advanced directives: Discussing with son [name] from [place] who would be HCPOA. Has packet at home.  Seat belt use discussed.  Sunscreen use discussed. No changing moles on skin.  Non smoker Alcohol - none  Lives with daughter, 1 dog. Widow of husband [name]) 2015. 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   Relevant past  medical, surgical, family and social history reviewed and updated as indicated. Interim medical history since our last visit reviewed. Allergies and medications reviewed and updated. Outpatient Medications Prior to Visit  Medication Sig Dispense Refill  . carbidopa-levodopa (SINEMET CR) 50-200 MG tablet TAKE 1 TABLET BY MOUTH AT BEDTIME. 90 tablet 1  . carbidopa-levodopa (SINEMET IR) 25-100 MG tablet Take 1.5 tablets by mouth 3 (three) times daily. 405 tablet 1  . clonazePAM (KLONOPIN) 0.5 MG tablet TAKE 1 TABLET BY MOUTH TWICE A DAY AS NEEDED 60 tablet 1  . cyanocobalamin (,VITAMIN B-12,) 1000 MCG/ML injection Inject 1 mL (1,000 mcg total) into the muscle every 30 (thirty) days.    . Droxidopa (NORTHERA) 300 MG CAPS Take 2 capsules by mouth 3 (three) times daily.    Marland Kitchen gabapentin (NEURONTIN) 100 MG capsule Take 1 capsule (100 mg total) by mouth 3 (three) times daily. (Patient taking differently: Take 100 mg by mouth 2 (two) times daily. ) 90 capsule 1  . MYRBETRIQ 50 MG TB24 tablet Take 50 mg by mouth daily.  11  . pantoprazole (PROTONIX) 40 MG tablet Take 1 tablet (40 mg total) by mouth daily. Take 30-60 min before first meal of the day 30 tablet 2  . polyethylene glycol (GLYCOLAX) packet Take 17 g by mouth as needed.      . pramipexole (MIRAPEX) 1 MG tablet TAKE 1 TABLET (1 MG TOTAL) BY MOUTH 3 (THREE) TIMES DAILY. [place]  tablet 1  . sertraline (ZOLOFT) 100 MG tablet TAKE 1 TABLET (100 MG TOTAL) BY MOUTH DAILY. 90 tablet 1  . timolol (BETIMOL) 0.5 % ophthalmic solution 1 drop 2 (two) times daily.    . traMADol (ULTRAM) 50 MG tablet 1-2 every 4 hours as needed for cough or pain 40 tablet 0  . triamcinolone cream (KENALOG) 0.5 % Apply to affected area twice daily for no more than 2 weeks at a time (Patient taking differently: as needed. Apply to affected area twice daily for no more than 2 weeks at a time) 30 g 1  . famotidine (PEPCID) 20 MG tablet One at bedtime 30 tablet 2   No  facility-administered medications prior to visit.      Per HPI unless specifically indicated in ROS section below Review of Systems     Objective:    BP 118/64 (BP Location: Left Arm, Patient Position: Sitting, Cuff Size: Normal)   Pulse 78   Temp 97.8 F (36.6 C) (Oral)   Ht 5\' 6"  (1.676 m)   Wt 158 lb (71.7 kg)   SpO2 97%   BMI 25.50 kg/m   Wt Readings from Last 3 Encounters:  10/09/17 158 lb (71.7 kg)  09/14/17 156 lb (70.8 kg)  09/04/17 157 lb (71.2 kg)    Physical Exam  Constitutional: She appears well-developed and well-nourished. No distress.  Neurological:  Evident involuntary movement of jaw  Nursing note and vitals reviewed.  Results for orders placed or performed in visit on 09/18/17  Lipid panel  Result Value Ref Range   Cholesterol 180 0 - 200 mg/dL   Triglycerides 76.0 0.0 - 149.0 mg/dL   HDL 57.60 >39.00 mg/dL   VLDL 15.2 0.0 - 40.0 mg/dL   LDL Cholesterol 107 (H) 0 - 99 mg/dL   Total CHOL/HDL Ratio 3    NonHDL 122.31   CBC with Differential/Platelet  Result Value Ref Range   WBC 4.6 4.0 - 10.5 K/uL   RBC 4.35 3.87 - 5.11 Mil/uL   Hemoglobin 11.6 (L) 12.0 - 15.0 g/dL   HCT 36.0 36.0 - 46.0 %   MCV 82.7 78.0 - 100.0 fl   MCHC 32.2 30.0 - 36.0 g/dL   RDW 17.3 (H) 11.5 - 15.5 %   Platelets 256.0 150.0 - 400.0 K/uL   Neutrophils Relative % 48.4 43.0 - 77.0 %   Lymphocytes Relative 41.2 12.0 - 46.0 %   Monocytes Relative 6.6 3.0 - 12.0 %   Eosinophils Relative 2.9 0.0 - 5.0 %   Basophils Relative 0.9 0.0 - 3.0 %   Neutro Abs 2.2 1.4 - 7.7 K/uL   Lymphs Abs 1.9 0.7 - 4.0 K/uL   Monocytes Absolute 0.3 0.1 - 1.0 K/uL   Eosinophils Absolute 0.1 0.0 - 0.7 K/uL   Basophils Absolute 0.0 0.0 - 0.1 K/uL  Basic metabolic panel  Result Value Ref Range   Sodium 141 135 - 145 mEq/L   Potassium 4.8 3.5 - 5.1 mEq/L   Chloride 103 96 - 112 mEq/L   CO2 31 19 - 32 mEq/L   Glucose, Bld 100 (H) 70 - 99 mg/dL   BUN 15 6 - 23 mg/dL   Creatinine, Ser 0.77 0.40 -  1.20 mg/dL   Calcium 9.7 8.4 - 10.5 mg/dL   GFR 78.95 >60.00 mL/min  Ferritin  Result Value Ref Range   Ferritin 57.7 10.0 - 291.0 ng/mL      Assessment & Plan:  Labs reviewed with patient. Problem List  Items Addressed This Visit    Chronic cough    Followed by pulm managed with gabapentin and tramadol. Unable to tolerate 100mg  gabapentin TID so she's only taking twice daily. Ongoing cough.       Dysphagia - Primary    MBS and plain esophagram unrevealing. ?Parkinson's related. Due to persistent globus sensation will refer to GI for further evaluation per pt request.       Relevant Orders   Ambulatory referral to Gastroenterology   Parkinson's disease [facility])       Follow up plan: No Follow-up on file.  [name], MD

## 2017-10-09 NOTE — Assessment & Plan Note (Signed)
Followed by pulm managed with gabapentin and tramadol. Unable to tolerate 100mg  gabapentin TID so she's only taking twice daily. Ongoing cough.

## 2017-10-09 NOTE — Patient Instructions (Addendum)
Return after 11/14/2017 for physical.  We will refer you back to GI for further evaluation.  Good to see you today, see you next month.

## 2017-10-09 NOTE — Assessment & Plan Note (Addendum)
MBS and plain esophagram unrevealing. ?Parkinson's related. Due to persistent globus sensation will refer to GI for further evaluation per pt request.

## 2017-10-12 ENCOUNTER — Ambulatory Visit: Payer: Medicare Other | Admitting: Internal Medicine

## 2017-10-12 ENCOUNTER — Other Ambulatory Visit: Payer: Self-pay | Admitting: Family Medicine

## 2017-10-12 NOTE — Telephone Encounter (Signed)
plz phone in. 

## 2017-10-12 NOTE — Telephone Encounter (Signed)
Refill left on vm at pharmacy per Dr. G.   

## 2017-10-12 NOTE — Telephone Encounter (Signed)
Last filled:  08/06/17, #60 Last OV:  10/09/17 Next OV:  11/16/17

## 2017-10-23 ENCOUNTER — Ambulatory Visit: Payer: Medicare Other | Admitting: Physician Assistant

## 2017-10-23 ENCOUNTER — Encounter: Payer: Self-pay | Admitting: Physician Assistant

## 2017-10-23 VITALS — BP 102/60 | HR 80 | Ht 64.5 in | Wt 159.4 lb

## 2017-10-23 DIAGNOSIS — G20 Parkinson's disease: Secondary | ICD-10-CM | POA: Diagnosis not present

## 2017-10-23 DIAGNOSIS — R059 Cough, unspecified: Secondary | ICD-10-CM

## 2017-10-23 DIAGNOSIS — R1310 Dysphagia, unspecified: Secondary | ICD-10-CM | POA: Diagnosis not present

## 2017-10-23 DIAGNOSIS — R05 Cough: Secondary | ICD-10-CM

## 2017-10-23 DIAGNOSIS — K219 Gastro-esophageal reflux disease without esophagitis: Secondary | ICD-10-CM

## 2017-10-23 NOTE — Progress Notes (Signed)
Subjective:    Patient ID: [name], female    DOB: June 01, 1946, 71 y.o.   MRN: [id number]  HPI Chevette is a pleasant 71 year old white female, referred today by Dr. Danise Mina for evaluation of dysphagia. She is known previously to Dr. Delfin Edis and was last seen here in 2011 when she had upper endoscopy which was a normal exam with exception of a small amount retained food in the stomach. She had colonoscopy in August 2009 which was a normal exam. Patient has history of coronary artery disease, GERD, Parkinson's, fibromyalgia and anxiety. Patient states that she's been having difficulty for some time with a chronic cough which comes and goes throughout the day. This does not seem to be related to activity or eating. She says sometimes she has bad coughing spells where she can't stop coughing. She's not aware of any particular aggravating factors. She was seen recently by Dr. Doyne Keel and actually had a barium swallow done in October 2018 which showed occasional secondary and tertiary contractions no stricture or mass noted there was borderline slow but successful progression of the barium tablet to the stomach. Patient says that she does have chronic difficulty with swallowing particularly with heavier foods like meats. This is been going on for the past couple of years. She says sometimes she feels as if food or meat sits in her esophagus for a while and then gradually goes on down. She's had very rare episodes requiring regurgitation. She does not feel that her coughing is related to swallowing or eating. She has no regular heartburn or indigestion. She has very occasionally noted some bitter liquid in the back of her throat. She is maintained on pantoprazole 40 mg by mouth every morning.  Review of Systems Pertinent positive and negative review of systems were noted in the above HPI section.  All other review of systems was otherwise negative.  Outpatient Encounter Medications as of  10/23/2017  Medication Sig  . carbidopa-levodopa (SINEMET CR) 50-200 MG tablet TAKE 1 TABLET BY MOUTH AT BEDTIME.  . carbidopa-levodopa (SINEMET IR) 25-100 MG tablet Take 1.5 tablets by mouth 3 (three) times daily.  . clonazePAM (KLONOPIN) 0.5 MG tablet TAKE 1 TABLET BY MOUTH TWICE A DAY AS NEEDED  . cyanocobalamin (,VITAMIN B-12,) 1000 MCG/ML injection Inject 1 mL (1,000 mcg total) into the muscle every 30 (thirty) days.  . Droxidopa (NORTHERA) 300 MG CAPS Take 2 capsules by mouth 3 (three) times daily.  Marland Kitchen gabapentin (NEURONTIN) 100 MG capsule Take 1 capsule (100 mg total) by mouth 3 (three) times daily. (Patient taking differently: Take 100 mg by mouth 2 (two) times daily. )  . MYRBETRIQ 50 MG TB24 tablet Take 50 mg by mouth daily.  . pantoprazole (PROTONIX) 40 MG tablet Take 1 tablet (40 mg total) by mouth daily. Take 30-60 min before first meal of the day  . polyethylene glycol (GLYCOLAX) packet Take 17 g by mouth as needed.    . pramipexole (MIRAPEX) 1 MG tablet TAKE 1 TABLET (1 MG TOTAL) BY MOUTH 3 (THREE) TIMES DAILY.  Marland Kitchen sertraline (ZOLOFT) 100 MG tablet TAKE 1 TABLET (100 MG TOTAL) BY MOUTH DAILY.  Marland Kitchen timolol (BETIMOL) 0.5 % ophthalmic solution 1 drop 2 (two) times daily.  . traMADol (ULTRAM) 50 MG tablet 1-2 every 4 hours as needed for cough or pain  . triamcinolone cream (KENALOG) 0.5 % Apply to affected area twice daily for no more than 2 weeks at a time (Patient taking differently: as needed. Apply  to affected area twice daily for no more than 2 weeks at a time)   No facility-administered encounter medications on file as of 10/23/2017.    Allergies  Allergen Reactions  . Amitriptyline Other (See Comments)    Sedated next morning  . Ciprofloxacin Nausea And Vomiting  . Cymbalta [Duloxetine Hcl] Other (See Comments)    Worsened depression  . Imipramine Hcl     REACTION: rash  . Iohexol      Code: HIVES, Desc: PT developed 2 hives, followed by SOB, severe headache post 87cc's  Omnipaque 300., Onset Date: 81191478   . Lyrica [Pregabalin] Other (See Comments)    Tried during hospitalization - unsure effects but unable to tolerate  . Morphine Sulfate     REACTION: rash  . Sulfamethoxazole     REACTION: rash  . Lidocaine Hcl Rash  . Neosporin [Neomycin-Bacitracin Zn-Polymyx] Rash    Worsened skin breaking out  . Tetracyclines & Related Rash   Patient Active Problem List   Diagnosis Date Noted  . Dysphagia 10/09/2017  . Chronic cough 07/16/2017  . Right foot pain 04/28/2016  . Skin rash 04/28/2016  . GAD (generalized anxiety disorder) 04/28/2016  . Chronic insomnia 03/07/2016  . Left Achilles tendinitis 12/08/2015  . Parkinson's disease ([place]) 08/25/2015  . Advanced care planning/counseling discussion 05/26/2015  . Family circumstance 03-16-2015  . Health maintenance examination 05/21/2014  . MDD (major depressive disorder), single episode, moderate ([place]) 11/12/2013  . Dysuria 09/02/2013  . Back pain 05/31/2013  . Medicare annual wellness visit, subsequent 05/20/2013  . HLD (hyperlipidemia) 05/20/2013  . Vitamin D deficiency 05/09/2013  . Recurrent UTI 01/03/2011  . Syncope 01/03/2011  . RASH-NONVESICULAR 05/11/2010  . GERD 15-Mar-2010  . CAD (coronary artery disease) 01/25/2010  . Osteoporosis 11/10/2009  . HYPOTENSION, ORTHOSTATIC 12/06/2008  . Anemia 09/25/2007  . B12 deficiency 05/03/2007  . Fibromyalgia 05/03/2007   Social History   Socioeconomic History  . Marital status: Married    Spouse name: Not on file  . Number of children: 4  . Years of education: Not on file  . Highest education level: Not on file  Social Needs  . Financial resource strain: Not on file  . Food insecurity - worry: Not on file  . Food insecurity - inability: Not on file  . Transportation needs - medical: Not on file  . Transportation needs - non-medical: Not on file  Occupational History  . Occupation: retired  Tobacco Use  . Smoking status: Never Smoker  .  Smokeless tobacco: Never Used  Substance and Sexual Activity  . Alcohol use: No  . Drug use: No  . Sexual activity: No  Other Topics Concern  . Not on file  Social History Narrative   Widow - husband [name]) passed away 03-15-2013.     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 Mcclenathan's family history includes Breast cancer in her sister; CAD in her brother; Clotting disorder in her son; Diabetes in her father; Esophageal cancer in her mother; Heart attack in her father; Lung cancer in her brother and brother; Ovarian cancer in her sister; Prostate cancer in her father; Uterine cancer in her sister.      Objective:    Vitals:   10/23/17 1047  BP: 102/60  Pulse: 80  Physical Exam ; well-developed older white female in no acute distress, pleasant, height 5 foot 4, weight 159, BMI of 26.9. HEENT nontraumatic normocephalic EOMI PERRLA sclera anicteric, mild involuntary head movements, Cardiovascular regular rate and rhythm with S1-S2 no murmur or gallop, Pulmonary clear bilaterally, Abdomen soft nontender nondistended bowel sounds active no palpable mass or hepatosplenomegaly, Neuropsych and affect appropriate, involuntary movements consistent with Parkinson's       Assessment & Plan:   #19 71 year old white female with Parkinson's disease with chronic dysphagia. She had prior normal EGD in 2011 and recent barium swallow which does not show any evidence of stricture or mass, no aspiration demonstrated she does have findings of nonspecific dysmotility. I suspect her dysphagia is primarily secondary to Parkinson's disease. #2 history of GERD, stable on pantoprazole #3 chronic cough-by history and barium swallow not convinced that her cough is related to acid reflux. I wonder she's having silent aspiration triggering cough #4 colon  cancer surveillance-patient will be due for follow-up colonoscopy August 2019 #5 fibromyalgia #6 anxiety  Plan; Patient will continue pantoprazole 40 mg by mouth every morning before meals breakfast Will schedule for modified swallowing study with speech pathology. Patient has follow-up with neurology/Dr. [name] in December, and can further discuss her dysphagia and coughing symptoms with Dr. Theda Sers. Patient will be placed for recall colonoscopy with Dr. Silverio Decamp in August 2019   [place] Genia Harold PA-C 10/23/2017   Cc: [name], MD

## 2017-10-23 NOTE — Progress Notes (Signed)
Reviewed and agree with documentation and assessment and plan. K. Veena Kimmy Totten , MD   

## 2017-10-23 NOTE — Patient Instructions (Signed)
You have been scheduled for a modified barium swallow on 11/01/2017 at 1:00pm. Please arrive 15 minutes prior to your test for registration. You will go to [facility]iology (1st Floor) for your appointment. Should you need to cancel or reschedule your appointment, please contact [phone] [place] [place]) or [phone] [place]). _____________________________________________________________________ A Modified Barium Swallow Study, or MBS, is a special x-ray that is taken to check swallowing skills. It is carried out by a Stage manager and a Psychologist, clinical (SLP). During this test, yourmouth, throat, and esophagus, a muscular tube which connects your mouth to your stomach, is checked. The test will help you, your doctor, and the SLP plan what types of foods and liquids are easier for you to swallow. The SLP will also identify positions and ways to help you swallow more easily and safely. What will happen during an MBS? You will be taken to an x-ray room and seated comfortably. You will be asked to swallow small amounts of food and liquid mixed with barium. Barium is a liquid or paste that allows images of your mouth, throat and esophagus to be seen on x-ray. The x-ray captures moving images of the food you are swallowing as it travels from your mouth through your throat and into your esophagus. This test helps identify whether food or liquid is entering your lungs (aspiration). The test also shows which part of your mouth or throat lacks strength or coordination to move the food or liquid in the right direction. This test typically takes 30 minutes to 1 hour to complete. _______________________________________________________________________

## 2017-10-24 ENCOUNTER — Other Ambulatory Visit (HOSPITAL_COMMUNITY): Payer: Self-pay | Admitting: Physician Assistant

## 2017-10-24 DIAGNOSIS — R1310 Dysphagia, unspecified: Secondary | ICD-10-CM

## 2017-10-25 ENCOUNTER — Other Ambulatory Visit: Payer: Self-pay | Admitting: Family Medicine

## 2017-10-25 NOTE — Telephone Encounter (Signed)
Last filled:  08/10/17, #50 Last OV:  10/09/17 Next OV:  11/16/17 CSA and uds:  04/28/16

## 2017-10-26 NOTE — Telephone Encounter (Signed)
Sent electronically 

## 2017-10-31 ENCOUNTER — Other Ambulatory Visit: Payer: Self-pay | Admitting: Internal Medicine

## 2017-11-01 ENCOUNTER — Ambulatory Visit (HOSPITAL_COMMUNITY): Payer: Medicare Other

## 2017-11-01 ENCOUNTER — Telehealth: Payer: Self-pay

## 2017-11-01 NOTE — Telephone Encounter (Signed)
-----   Message from Alfredia Ferguson, PA-C sent at 10/31/2017  3:16 PM EST ----- Regarding: speech path swallow study  Beth - speech path called me today about swallow eval scheduled for  Tomorrow-  I did not realize pt  Already had a speech path eval on 10/26 /2018 .Marland Kitchen  Please cancel the order for exam tomorrow , and please call pt and let her know she does NOT need to go tomorrow for that test !. It showed slow emptying of her esophagus- no frank aspiration but felt to be at increased risk-. Please make sure pt understands the instructions for eating which are on the report .  She has Parkinsons' and is  to follow up with Dr [name] .Thanks

## 2017-11-01 NOTE — Telephone Encounter (Signed)
No answer at the patient's home. The appointment has already been cancelled.

## 2017-11-08 ENCOUNTER — Ambulatory Visit: Payer: Medicare Other | Admitting: Family Medicine

## 2017-11-12 ENCOUNTER — Telehealth: Payer: Self-pay | Admitting: *Deleted
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
## 2017-11-12 NOTE — Telephone Encounter (Signed)
Spoke to pt and scheduled Ca lab and prolia injection for 12/20

## 2017-11-12 NOTE — Telephone Encounter (Signed)
Verification of benefits have been processed and an approval has been received for pts prolia injection. Pts estimated cost are appx $90. This is only an estimate and cannot be confirmed until benefits are paid. Please advise pt and schedule if needed. If scheduled, once the injection is received, pls contact me back with the date it was received so that I am able to update prolia folder. thanks  

## 2017-11-13 ENCOUNTER — Other Ambulatory Visit (INDEPENDENT_AMBULATORY_CARE_PROVIDER_SITE_OTHER): Payer: Medicare Other

## 2017-11-13 ENCOUNTER — Ambulatory Visit: Payer: Medicare Other | Admitting: Neurology

## 2017-11-13 ENCOUNTER — Encounter: Payer: Self-pay | Admitting: Psychology

## 2017-11-13 ENCOUNTER — Encounter: Payer: Self-pay | Admitting: Neurology

## 2017-11-13 VITALS — BP 118/76 | HR 70 | Ht 65.0 in | Wt 160.0 lb

## 2017-11-13 DIAGNOSIS — M810 Age-related osteoporosis without current pathological fracture: Secondary | ICD-10-CM | POA: Diagnosis not present

## 2017-11-13 DIAGNOSIS — G20 Parkinson's disease: Secondary | ICD-10-CM

## 2017-11-13 DIAGNOSIS — G903 Multi-system degeneration of the autonomic nervous system: Secondary | ICD-10-CM | POA: Diagnosis not present

## 2017-11-13 LAB — CALCIUM: CALCIUM: 9.5 mg/dL (ref 8.4–10.5)

## 2017-11-13 NOTE — Patient Instructions (Signed)
1. Take your Northera 300 mg - one tablet three times daily (take at the same time as Levodopa and Pramipexole).

## 2017-11-14 NOTE — Progress Notes (Signed)
I met with the patient while she was in the clinic today.  She reports that she is seeing a counselor to help with emotional and social issues.  The patient has a very complicated family dynamic.  We talked a little bit about self-care and managing stress as it appears that the family dynamics can cause an extreme amount of stress.  I provided her with my contact information and welcomed her to contact me with any questions or needs that she has.

## 2017-11-15 ENCOUNTER — Ambulatory Visit (INDEPENDENT_AMBULATORY_CARE_PROVIDER_SITE_OTHER): Payer: Medicare Other | Admitting: *Deleted

## 2017-11-15 DIAGNOSIS — M810 Age-related osteoporosis without current pathological fracture: Secondary | ICD-10-CM

## 2017-11-15 MED ORDER — DENOSUMAB 60 MG/ML ~~LOC~~ SOLN
60.0000 mg | Freq: Once | SUBCUTANEOUS | Status: AC
  Administered 2017-11-15: 60 mg via SUBCUTANEOUS

## 2017-11-16 ENCOUNTER — Encounter: Payer: Medicare Other | Admitting: Family Medicine

## 2017-11-30 ENCOUNTER — Telehealth: Payer: Self-pay | Admitting: Neurology

## 2017-11-30 NOTE — Telephone Encounter (Signed)
Patient called regarding her Medication Pramipexole. She said she has a bottle that was 0.5 left over but she has been taking 1 MG. Can she finish the 0.5 bottle or she will need a refill for 1 MG. Please Call. Thanks

## 2017-11-30 NOTE — Telephone Encounter (Signed)
Left message on machine for patient to call back.  Will make her aware okay to take two of the 0.5 mg tablets to equal 1 mg. Awaiting call back.

## 2017-12-04 ENCOUNTER — Ambulatory Visit: Payer: Medicare Other | Attending: Neurology | Admitting: Physical Therapy

## 2017-12-04 ENCOUNTER — Encounter: Payer: Self-pay | Admitting: Physical Therapy

## 2017-12-04 DIAGNOSIS — R262 Difficulty in walking, not elsewhere classified: Secondary | ICD-10-CM | POA: Diagnosis present

## 2017-12-04 DIAGNOSIS — M6281 Muscle weakness (generalized): Secondary | ICD-10-CM | POA: Insufficient documentation

## 2017-12-04 NOTE — Therapy (Signed)
[place], [place], [address] Phone: [phone]   Fax:  [phone]  Physical Therapy Evaluation  Patient Details  Name: [name] MRN: [id number] Date of Birth: May 04, 1946 Referring Provider: [name]   Encounter Date: 12/04/2017  PT End of Session - 12/04/17 1101    Visit Number  1    Date for PT Re-Evaluation  02/01/18    PT Start Time  1005    PT Stop Time  1055    PT Time Calculation (min)  50 min    Activity Tolerance  Patient tolerated treatment well    Behavior During Therapy  [facility] for tasks assessed/performed       Past Medical History:  Diagnosis Date  . ANEMIA-NOS 09/25/2007  . Anxiety   . Arthritis   . B12 DEFICIENCY 05/03/2007  . CAD (coronary artery disease) 01/2010   MI, Nishan  . Cardiomyopathy ([place]) 02/08/2010   H/o this 2012 after urosepsis, no recurrence.   . Depression   . FIBROMYALGIA 05/03/2007  . GERD 02/22/2010  . Glaucoma 02/2013    eye center  . History of CHF (congestive heart failure) 01/2010  . History of colon polyps 2004  . HYPERLIPIDEMIA 12/19/2007  . HYPOTENSION, ORTHOSTATIC 12/06/2008  . Interstitial cystitis    [place] now Dr Amalia Hailey  . Lupus (systemic lupus erythematosus) ([place]) 02/08/2010  . MCTD (mixed connective tissue disease) ([place]) 02/08/2010  . OSTEOPOROSIS 08/2009   bisphosphonate on hold 2/2 dysphagia, on reclast done in August each year  . Parkinson's disease ([place]) 08/25/2015   Dx Dr Carles Collet 07/2015   . 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 Takotsubo cardiomyopathy 2008   due to E coli urosepsis    Past Surgical History:  Procedure Laterality Date  . ABDOMINAL HYSTERECTOMY  1970s   IUD infection - first partial then with oophorectomy (cysts), complication - low blood pressure  . CATARACT EXTRACTION Right   . CHOLECYSTECTOMY     complication - low blood pressure  . COLONOSCOPY  06/2008   h/o polyps but latest WNL, rec  rpt 10 yrs Olevia Perches)  . CYSTOSCOPY  12/2013   abx treatment for recurrent cystitis  . DEXA  04/2013   T -2.9 @ femur, -1.6 @ spine  . DEXA  04/2017   T -2.9 hip, -0.7 spine    There were no vitals filed for this visit.   Subjective Assessment - 12/04/17 1006    Subjective  Patient reports that she had some difficulty walking, she reports that she was at the MD office and almost fell.   She denies any falls but reports that she has had multiple stumbles.    Limitations  Walking    Currently in Pain?  Yes    Pain Score  4  pain in the right foot from an injury many years ago    Pain Location  Back    Pain Orientation  Right;Lower    Pain Descriptors / Indicators  Aching;Tightness    Pain Type  Chronic pain    Pain Radiating Towards  right buttock    Pain Onset  More than a month ago    Pain Frequency  Constant    Aggravating Factors   walking, getting up from bed or sitting will make it worse, c/o a catch    Pain Relieving Factors  once I get going    Effect of Pain on Daily  Activities  reports just pain         [facility] PT Assessment - 12/04/17 0001      Assessment   Medical Diagnosis  Parkinson's    Referring Provider  [name]    Onset Date/Surgical Date  11/03/17    Prior Therapy  for back pain      Precautions   Precautions  None      Balance Screen   Has the patient fallen in the past 6 months  No    Has the patient had a decrease in activity level because of a fear of falling?   No    Is the patient reluctant to leave their home because of a fear of falling?   No      Home Environment   Additional Comments  lives with daughter and two grandchildren      Prior Function   Level of Independence  Independent    Vocation  Retired    Leisure  no exercise      ROM / Strength   AROM / PROM / Strength  AROM;Strength      Strength   Overall Strength Comments  Strength of hips 4-/5, ankles 3+/5, knees 4-/5      Ambulation/Gait   Gait Comments  has significant antalgic  gait on the right , she does not use device, at times feels unsteady, and uses walls to steady self, she goes up and down stairs one at a time, had some loss of balance when going through a doorway      Standardized Balance Assessment   Standardized Balance Assessment  Timed Up and Go Test;Berg Balance Test      Berg Balance Test   Sit to Stand  Able to stand without using hands and stabilize independently    Standing Unsupported  Able to stand safely 2 minutes    Sitting with Back Unsupported but Feet Supported on Floor or Stool  Able to sit safely and securely 2 minutes    Stand to Sit  Sits safely with minimal use of hands    Transfers  Able to transfer safely, definite need of hands    Standing Unsupported with Eyes Closed  Unable to keep eyes closed 3 seconds but stays steady    Standing Ubsupported with Feet Together  Able to place feet together independently but unable to hold for 30 seconds    From Standing, Reach Forward with Outstretched Arm  Reaches forward but needs supervision    From Standing Position, Pick up Object from Floor  Able to pick up shoe, needs supervision    From Standing Position, Turn to Look Behind Over each Shoulder  Looks behind one side only/other side shows less weight shift    Turn 360 Degrees  Able to turn 360 degrees safely one side only in 4 seconds or less    Standing Unsupported, Alternately Place Feet on Step/Stool  Able to stand independently and complete 8 steps >20 seconds    Standing Unsupported, One Foot in Front  Able to plae foot ahead of the other independently and hold 30 seconds    Standing on One Leg  Tries to lift leg/unable to hold 3 seconds but remains standing independently    Total Score  39      Timed Up and Go Test   Normal TUG (seconds)  19             Objective measurements completed on examination: See above findings.  PT Education - 12/04/17 1048    Education provided  Yes    Education Details   3 way kicks, marching, side stepping    Person(s) Educated  Patient    Methods  Explanation;Demonstration    Comprehension  Verbalized understanding       PT Short Term Goals - 12/04/17 1155      PT SHORT TERM GOAL #1   Title  independent iwth initial HEP    Time  2    Period  Weeks    Status  New        PT Long Term Goals - 12/04/17 1156      PT LONG TERM GOAL #1   Title  Pt will improve TUG to 13.5 seconds to decrease fall risk    Time  8    Period  Weeks    Status  New      PT LONG TERM GOAL #2   Title  Pt will improve Berg balance score to 49 or greater to decrease fall risk    Time  8    Period  Weeks    Status  New      PT LONG TERM GOAL #3   Title  Pt will be independent for home exercises    Time  8    Period  Weeks    Status  New      PT LONG TERM GOAL #4   Title  pt will improve R quad strength to 4/5 or greater    Time  8    Period  Weeks    Status  New             Plan - 12/04/17 1150    Clinical Impression Statement  Patient with return to PT due to difficulty walking, unsteady on her feet.  She has Parkinson's and has reported increased tremors.  She lives with two daughters and two grandchildren.  She reports that she does a lot of the cooking, she reports that she has not had any falls but has had "close calls"  reports sometimes that she uses a cane or the walls to help steady her.  Her Tug time was 19 seconds, her Berg balance test score was 39/56, putting her at a high risk for falls    Clinical Presentation  Stable    Clinical Decision Making  Low    Rehab Potential  Good    PT Frequency  2x / week    PT Duration  8 weeks    PT Treatment/Interventions  ADLs/Self Care Home Management;Electrical Stimulation;Moist Heat;Gait training;Stair training;Functional mobility training;Therapeutic activities;Therapeutic exercise;Balance training;Neuromuscular re-education;Patient/family education    PT Next Visit Plan  slowly do gym for strength and  balance    Consulted and Agree with Plan of Care  Patient       Patient will benefit from skilled therapeutic intervention in order to improve the following deficits and impairments:  Abnormal gait, Decreased range of motion, Difficulty walking, Cardiopulmonary status limiting activity, Decreased endurance, Decreased balance, Decreased mobility, Decreased strength  Visit Diagnosis: Difficulty in walking, not elsewhere classified - Plan: PT plan of care cert/re-cert  Muscle weakness (generalized) - Plan: PT plan of care cert/re-cert     Problem List Patient Active Problem List   Diagnosis Date Noted  . Dysphagia 10/09/2017  . Chronic cough 07/16/2017  . Right foot pain 04/28/2016  . Skin rash 04/28/2016  . GAD (generalized anxiety disorder) 04/28/2016  . Chronic insomnia 03/07/2016  .  Left Achilles tendinitis 12/08/2015  . Parkinson's disease ([place]) 08/25/2015  . Advanced care planning/counseling discussion 05/26/2015  . Family circumstance 02/23/2015  . Health maintenance examination 05/21/2014  . MDD (major depressive disorder), single episode, moderate ([place]) 11/12/2013  . Dysuria 09/02/2013  . Back pain 05/31/2013  . Medicare annual wellness visit, subsequent 05/20/2013  . HLD (hyperlipidemia) 05/20/2013  . Vitamin D deficiency 05/09/2013  . Recurrent UTI 01/03/2011  . Syncope 01/03/2011  . RASH-NONVESICULAR 05/11/2010  . GERD 02/22/2010  . CAD (coronary artery disease) 01/25/2010  . Osteoporosis 11/10/2009  . HYPOTENSION, ORTHOSTATIC 12/06/2008  . Anemia 09/25/2007  . B12 deficiency 05/03/2007  . Fibromyalgia 05/03/2007    Sumner Boast., PT 12/04/2017, 12:49 PM  [place], [place], [address] Phone: [phone]   Fax:  [phone]  Name: [name] MRN: [id number] Date of Birth: [birth year]

## 2017-12-06 ENCOUNTER — Other Ambulatory Visit: Payer: Self-pay | Admitting: Internal Medicine

## 2017-12-07 ENCOUNTER — Other Ambulatory Visit: Payer: Self-pay | Admitting: Neurology

## 2017-12-07 MED ORDER — PRAMIPEXOLE DIHYDROCHLORIDE 1 MG PO TABS: 1.0000 mg | ORAL_TABLET | Freq: Three times a day (TID) | ORAL | 1 refills | Status: DC

## 2017-12-10 ENCOUNTER — Encounter: Payer: Self-pay | Admitting: Family Medicine

## 2017-12-10 ENCOUNTER — Ambulatory Visit (INDEPENDENT_AMBULATORY_CARE_PROVIDER_SITE_OTHER): Payer: Medicare Other | Admitting: Family Medicine

## 2017-12-10 ENCOUNTER — Telehealth: Payer: Self-pay | Admitting: Gastroenterology

## 2017-12-10 VITALS — BP 118/58 | HR 76 | Temp 98.0°F | Ht 65.0 in | Wt 163.0 lb

## 2017-12-10 DIAGNOSIS — F321 Major depressive disorder, single episode, moderate: Secondary | ICD-10-CM | POA: Diagnosis not present

## 2017-12-10 DIAGNOSIS — M810 Age-related osteoporosis without current pathological fracture: Secondary | ICD-10-CM

## 2017-12-10 DIAGNOSIS — G20 Parkinson's disease: Secondary | ICD-10-CM

## 2017-12-10 DIAGNOSIS — K219 Gastro-esophageal reflux disease without esophagitis: Secondary | ICD-10-CM | POA: Diagnosis not present

## 2017-12-10 DIAGNOSIS — R05 Cough: Secondary | ICD-10-CM | POA: Diagnosis not present

## 2017-12-10 DIAGNOSIS — Z7189 Other specified counseling: Secondary | ICD-10-CM

## 2017-12-10 DIAGNOSIS — E538 Deficiency of other specified B group vitamins: Secondary | ICD-10-CM | POA: Diagnosis not present

## 2017-12-10 DIAGNOSIS — Z0000 Encounter for general adult medical examination without abnormal findings: Secondary | ICD-10-CM | POA: Diagnosis not present

## 2017-12-10 DIAGNOSIS — R053 Chronic cough: Secondary | ICD-10-CM

## 2017-12-10 DIAGNOSIS — E785 Hyperlipidemia, unspecified: Secondary | ICD-10-CM

## 2017-12-10 MED ORDER — TRAMADOL HCL 50 MG PO TABS: ORAL_TABLET | ORAL | 0 refills | Status: DC

## 2017-12-10 MED ORDER — CYANOCOBALAMIN 1000 MCG/ML IJ SOLN
1000.0000 ug | Freq: Once | INTRAMUSCULAR | Status: AC
  Administered 2017-12-10: 1000 ug via INTRAMUSCULAR

## 2017-12-10 MED ORDER — CLONAZEPAM 0.5 MG PO TABS: 0.5000 mg | ORAL_TABLET | Freq: Two times a day (BID) | ORAL | 1 refills | Status: DC | PRN

## 2017-12-10 MED ORDER — CALCIUM CARBONATE-VITAMIN D 600-400 MG-UNIT PO CHEW: 1.0000 | CHEWABLE_TABLET | Freq: Every day | ORAL | Status: DC

## 2017-12-10 NOTE — Patient Instructions (Addendum)
If interested, check with pharmacy about new 2 shot shingles series (shingrix).  B12 shot today You will be due for colonoscopy after August of this year.  Follow up with GI - see Rosaria Ferries to schedule appointment.  Work on setting up advanced directive.  Restart calcium vit D 600/400 chewable vitamin.  You are doing well today Return as needed or in 4 months for follow up visit.   Health Maintenance, Female Adopting a healthy lifestyle and getting preventive care can go a long way to promote health and wellness. Talk with your health care provider about what schedule of regular examinations is right for you. This is a good chance for you to check in with your provider about disease prevention and staying healthy. In between checkups, there are plenty of things you can do on your own. Experts have done a lot of research about which lifestyle changes and preventive measures are most likely to keep you healthy. Ask your health care provider for more information. Weight and diet Eat a healthy diet  Be sure to include plenty of vegetables, fruits, low-fat dairy products, and lean protein.  Do not eat a lot of foods high in solid fats, added sugars, or salt.  Get regular exercise. This is one of the most important things you can do for your health. ? Most adults should exercise for at least 150 minutes each week. The exercise should increase your heart rate and make you sweat (moderate-intensity exercise). ? Most adults should also do strengthening exercises at least twice a week. This is in addition to the moderate-intensity exercise.  Maintain a healthy weight  Body mass index (BMI) is a measurement that can be used to identify possible weight problems. It estimates body fat based on height and weight. Your health care provider can help determine your BMI and help you achieve or maintain a healthy weight.  For females 12 years of age and older: ? A BMI below 18.5 is considered underweight. ? A  BMI of 18.5 to 24.9 is normal. ? A BMI of 25 to 29.9 is considered overweight. ? A BMI of 30 and above is considered obese.  Watch levels of cholesterol and blood lipids  You should start having your blood tested for lipids and cholesterol at 70 years of age, then have this test every 5 years.  You may need to have your cholesterol levels checked more often if: ? Your lipid or cholesterol levels are high. ? You are older than 70 years of age. ? You are at high risk for heart disease.  Cancer screening Lung Cancer  Lung cancer screening is recommended for adults 78-61 years old who are at high risk for lung cancer because of a history of smoking.  A yearly low-dose CT scan of the lungs is recommended for people who: ? Currently smoke. ? Have quit within the past 15 years. ? Have at least a 30-pack-year history of smoking. A pack year is smoking an average of one pack of cigarettes a day for 1 year.  Yearly screening should continue until it has been 15 years since you quit.  Yearly screening should stop if you develop a health problem that would prevent you from having lung cancer treatment.  Breast Cancer  Practice breast self-awareness. This means understanding how your breasts normally appear and feel.  It also means doing regular breast self-exams. Let your health care provider know about any changes, no matter how small.  If you are in your 17s  or 53s, you should have a clinical breast exam (CBE) by a health care provider every 1-3 years as part of a regular health exam.  If you are 54 or older, have a CBE every year. Also consider having a breast X-ray (mammogram) every year.  If you have a family history of breast cancer, talk to your health care provider about genetic screening.  If you are at high risk for breast cancer, talk to your health care provider about having an MRI and a mammogram every year.  Breast cancer gene (BRCA) assessment is recommended for women who  have family members with BRCA-related cancers. BRCA-related cancers include: ? Breast. ? Ovarian. ? Tubal. ? Peritoneal cancers.  Results of the assessment will determine the need for genetic counseling and BRCA1 and BRCA2 testing.  Cervical Cancer Your health care provider may recommend that you be screened regularly for cancer of the pelvic organs (ovaries, uterus, and vagina). This screening involves a pelvic examination, including checking for microscopic changes to the surface of your cervix (Pap test). You may be encouraged to have this screening done every 3 years, beginning at age 57.  For women ages 6-65, health care providers may recommend pelvic exams and Pap testing every 3 years, or they may recommend the Pap and pelvic exam, combined with testing for human papilloma virus (HPV), every 5 years. Some types of HPV increase your risk of cervical cancer. Testing for HPV may also be done on women of any age with unclear Pap test results.  Other health care providers may not recommend any screening for nonpregnant women who are considered low risk for pelvic cancer and who do not have symptoms. Ask your health care provider if a screening pelvic exam is right for you.  If you have had past treatment for cervical cancer or a condition that could lead to cancer, you need Pap tests and screening for cancer for at least 20 years after your treatment. If Pap tests have been discontinued, your risk factors (such as having a new sexual partner) need to be reassessed to determine if screening should resume. Some women have medical problems that increase the chance of getting cervical cancer. In these cases, your health care provider may recommend more frequent screening and Pap tests.  Colorectal Cancer  This type of cancer can be detected and often prevented.  Routine colorectal cancer screening usually begins at 70 years of age and continues through 70 years of age.  Your health care  provider may recommend screening at an earlier age if you have risk factors for colon cancer.  Your health care provider may also recommend using home test kits to check for hidden blood in the stool.  A small camera at the end of a tube can be used to examine your colon directly (sigmoidoscopy or colonoscopy). This is done to check for the earliest forms of colorectal cancer.  Routine screening usually begins at age 45.  Direct examination of the colon should be repeated every 5-10 years through 70 years of age. However, you may need to be screened more often if early forms of precancerous polyps or small growths are found.  Skin Cancer  Check your skin from head to toe regularly.  Tell your health care provider about any new moles or changes in moles, especially if there is a change in a mole's shape or color.  Also tell your health care provider if you have a mole that is larger than the size of a  pencil eraser.  Always use sunscreen. Apply sunscreen liberally and repeatedly throughout the day.  Protect yourself by wearing long sleeves, pants, a wide-brimmed hat, and sunglasses whenever you are outside.  Heart disease, diabetes, and high blood pressure  High blood pressure causes heart disease and increases the risk of stroke. High blood pressure is more likely to develop in: ? People who have blood pressure in the high end of the normal range (130-139/85-89 mm Hg). ? People who are overweight or obese. ? People who are African American.  If you are 87-20 years of age, have your blood pressure checked every 3-5 years. If you are 58 years of age or older, have your blood pressure checked every year. You should have your blood pressure measured twice-once when you are at a hospital or clinic, and once when you are not at a hospital or clinic. Record the average of the two measurements. To check your blood pressure when you are not at a hospital or clinic, you can use: ? An automated  blood pressure machine at a pharmacy. ? A home blood pressure monitor.  If you are between 45 years and 33 years old, ask your health care provider if you should take aspirin to prevent strokes.  Have regular diabetes screenings. This involves taking a blood sample to check your fasting blood sugar level. ? If you are at a normal weight and have a low risk for diabetes, have this test once every three years after 70 years of age. ? If you are overweight and have a high risk for diabetes, consider being tested at a younger age or more often. Preventing infection Hepatitis B  If you have a higher risk for hepatitis B, you should be screened for this virus. You are considered at high risk for hepatitis B if: ? You were born in a country where hepatitis B is common. Ask your health care provider which countries are considered high risk. ? Your parents were born in a high-risk country, and you have not been immunized against hepatitis B (hepatitis B vaccine). ? You have HIV or AIDS. ? You use needles to inject street drugs. ? You live with someone who has hepatitis B. ? You have had sex with someone who has hepatitis B. ? You get hemodialysis treatment. ? You take certain medicines for conditions, including cancer, organ transplantation, and autoimmune conditions.  Hepatitis C  Blood testing is recommended for: ? Everyone born from [birth year] through 1963. ? Anyone with known risk factors for hepatitis C.  Sexually transmitted infections (STIs)  You should be screened for sexually transmitted infections (STIs) including gonorrhea and chlamydia if: ? You are sexually active and are younger than 70 years of age. ? You are older than 70 years of age and your health care provider tells you that you are at risk for this type of infection. ? Your sexual activity has changed since you were last screened and you are at an increased risk for chlamydia or gonorrhea. Ask your health care provider if you  are at risk.  If you do not have HIV, but are at risk, it may be recommended that you take a prescription medicine daily to prevent HIV infection. This is called pre-exposure prophylaxis (PrEP). You are considered at risk if: ? You are sexually active and do not regularly use condoms or know the HIV status of your partner(s). ? You take drugs by injection. ? You are sexually active with a partner who has HIV.  Talk with your health care provider about whether you are at high risk of being infected with HIV. If you choose to begin PrEP, you should first be tested for HIV. You should then be tested every 3 months for as long as you are taking PrEP. Pregnancy  If you are premenopausal and you may become pregnant, ask your health care provider about preconception counseling.  If you may become pregnant, take 400 to 800 micrograms (mcg) of folic acid every day.  If you want to prevent pregnancy, talk to your health care provider about birth control (contraception). Osteoporosis and menopause  Osteoporosis is a disease in which the bones lose minerals and strength with aging. This can result in serious bone fractures. Your risk for osteoporosis can be identified using a bone density scan.  If you are 26 years of age or older, or if you are at risk for osteoporosis and fractures, ask your health care provider if you should be screened.  Ask your health care provider whether you should take a calcium or vitamin D supplement to lower your risk for osteoporosis.  Menopause may have certain physical symptoms and risks.  Hormone replacement therapy may reduce some of these symptoms and risks. Talk to your health care provider about whether hormone replacement therapy is right for you. Follow these instructions at home:  Schedule regular health, dental, and eye exams.  Stay current with your immunizations.  Do not use any tobacco products including cigarettes, chewing tobacco, or electronic  cigarettes.  If you are pregnant, do not drink alcohol.  If you are breastfeeding, limit how much and how often you drink alcohol.  Limit alcohol intake to no more than 1 drink per day for nonpregnant women. One drink equals 12 ounces of beer, 5 ounces of wine, or 1 ounces of hard liquor.  Do not use street drugs.  Do not share needles.  Ask your health care provider for help if you need support or information about quitting drugs.  Tell your health care provider if you often feel depressed.  Tell your health care provider if you have ever been abused or do not feel safe at home. This information is not intended to replace advice given to you by your health care provider. Make sure you discuss any questions you have with your health care provider. Document Released: 05/29/2011 Document Revised: 04/20/2016 Document Reviewed: 08/17/2015 Elsevier Interactive Patient Education  [employer].

## 2017-12-10 NOTE — Assessment & Plan Note (Signed)
Preventative protocols reviewed and updated unless pt declined. Discussed healthy diet and lifestyle.  

## 2017-12-10 NOTE — Assessment & Plan Note (Signed)
Appreciate neuro care.

## 2017-12-10 NOTE — Assessment & Plan Note (Signed)
DEXA stable 2014 --> 2018. Continue prolia (started 03/2017)

## 2017-12-10 NOTE — Telephone Encounter (Signed)
ok 

## 2017-12-10 NOTE — Assessment & Plan Note (Signed)
Chronic cough not likely PD-aspiration related according to neurology.  Will refer back to GI for recheck. ?GERD related chronic cough not improving with pantoprazole/pepcid.  Planned colonoscopy 06/2018.

## 2017-12-10 NOTE — Assessment & Plan Note (Signed)
b12 shot today. 

## 2017-12-10 NOTE — Assessment & Plan Note (Signed)
Continue sertraline and klonopin. Patent examiner.

## 2017-12-10 NOTE — Assessment & Plan Note (Signed)
Treated with daily pantoprazole and pepcid nightly.

## 2017-12-10 NOTE — Assessment & Plan Note (Signed)
Chronic, stable off statin. The 10-year ASCVD risk score Mikey Bussing DC Brooke Bonito., et al., 2013) is: 7%   Values used to calculate the score:     Age: 72 years     Sex: Female     Is Non-Hispanic African American: No     Diabetic: No     Tobacco smoker: No     Systolic Blood Pressure: 654 mmHg     Is BP treated: No     HDL Cholesterol: 57.6 mg/dL     Total Cholesterol: 180 mg/dL

## 2017-12-10 NOTE — Progress Notes (Signed)
BP (!) 118/58 (BP Location: Left Arm, Patient Position: Sitting, Cuff Size: Normal)   Pulse 76   Temp 98 F (36.7 C) (Oral)   Ht 5\' 5"  (1.651 m)   Wt 163 lb (73.9 kg)   SpO2 96%   BMI 27.12 kg/m    CC: CPE Subjective:    Patient ID: [name], female    DOB: 24-Nov-1946, 72 y.o.   MRN: [id number]  HPI: [name] is a 72 y.o. female presenting on 12/10/2017 for Annual Exam (Pt 2.  Wants to discuss meds. Needs vit B12 inj.)   Saw [name] 08/2017 for medicare wellness visit. Note reviewed.  PD - PT through [facility]. Recommending cane use.  Depression - [facility] Chronic cough - not PD related according to Dr [name].   Preventative: COLONOSCOPY Date: 06/2008 h/o polyps but latest WNL, rec rpt 10 yrs Olevia Perches).  Mammogram 04/2017 WNL. Gets Q2 yrs. Will call to schedule.  Well woman - s/p hysterectomy and oophorectomy.  DEXA Date: 04/2013 T -2.9 at femur, -1.6 at spine.  DEXA 04/2017 T -2.9 hip, -0.7 spine. Prior on reclast last 06/2014. prolia started 04/2017.  Flu yearly prevnar 04/2014, pneumovax 04/2015 Tdap 2012  Zostavax 2011  shingrix - discussed Advanced directives: Discussing with son [name] from [place] who would be HCPOA. Has packet at home.  Seat belt use discussed.  Sunscreen use discussed. No changing moles on skin.  Non smoker Alcohol - none  Lives with daughter, 1 dog. Widow of husband [name]) 2015. 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   Relevant past medical, surgical, family and social history reviewed and updated as indicated. Interim medical history since our last visit reviewed. Allergies and medications reviewed and updated. Outpatient Medications Prior to Visit  Medication Sig Dispense Refill  . carbidopa-levodopa  (SINEMET CR) 50-200 MG tablet TAKE 1 TABLET BY MOUTH AT BEDTIME. 90 tablet 1  . carbidopa-levodopa (SINEMET IR) 25-100 MG tablet Take 1.5 tablets by mouth 3 (three) times daily. 405 tablet 1  . cyanocobalamin (,VITAMIN B-12,) 1000 MCG/ML injection Inject 1 mL (1,000 mcg total) into the muscle every 30 (thirty) days.    . Droxidopa (NORTHERA) 300 MG CAPS Take 1 capsule by mouth 2 (two) times daily.     . famotidine (PEPCID) 20 MG tablet TAKE 1 TABLET BY MOUTH EVERYDAY AT BEDTIME 30 tablet 2  . gabapentin (NEURONTIN) 100 MG capsule Take 1 capsule (100 mg total) by mouth 3 (three) times daily. (Patient taking differently: Take 100 mg by mouth 2 (two) times daily. ) 90 capsule 1  . MYRBETRIQ 50 MG TB24 tablet Take 50 mg by mouth daily.  11  . pantoprazole (PROTONIX) 40 MG tablet TAKE 1 TABLET (40 MG TOTAL) BY MOUTH DAILY. TAKE 30-60 MIN BEFORE FIRST MEAL OF THE DAY 30 tablet 2  . polyethylene glycol (GLYCOLAX) packet Take 17 g by mouth as needed.      . pramipexole (MIRAPEX) 1 MG tablet TAKE 1 TABLET (1 MG TOTAL) BY MOUTH 3 (THREE) TIMES DAILY. 270 tablet 1  . pramipexole (MIRAPEX) 1 MG tablet Take 1 tablet (1 mg total) by mouth 3 (three) times daily. 270 tablet 1  . sertraline (ZOLOFT) 100 MG tablet TAKE 1 TABLET (100 MG TOTAL) BY MOUTH DAILY. 90 tablet  1  . timolol (BETIMOL) 0.5 % ophthalmic solution 1 drop 2 (two) times daily.    Marland Kitchen triamcinolone cream (KENALOG) 0.5 % Apply to affected area twice daily for no more than 2 weeks at a time (Patient taking differently: as needed. Apply to affected area twice daily for no more than 2 weeks at a time) 30 g 1  . clonazePAM (KLONOPIN) 0.5 MG tablet TAKE 1 TABLET BY MOUTH TWICE A DAY AS NEEDED 60 tablet 1  . traMADol (ULTRAM) 50 MG tablet TAKE 1 TABLET BY MOUTH 3 TIMES A DAY AS NEEDED 50 tablet 0   No facility-administered medications prior to visit.      Per HPI unless specifically indicated in ROS section below Review of Systems  Constitutional:  Negative for activity change, appetite change, chills, fatigue, fever and unexpected weight change.  HENT: Negative for hearing loss.   Eyes: Negative for visual disturbance.  Respiratory: Positive for cough (chronic). Negative for chest tightness, shortness of breath and wheezing.   Cardiovascular: Negative for chest pain, palpitations and leg swelling.  Gastrointestinal: Negative for abdominal distention, abdominal pain, blood in stool, constipation, diarrhea, nausea and vomiting.  Genitourinary: Negative for difficulty urinating and hematuria.  Musculoskeletal: Negative for arthralgias, myalgias and neck pain.  Skin: Negative for rash.  Neurological: Negative for dizziness, seizures, syncope and headaches.  Hematological: Negative for adenopathy. Does not bruise/bleed easily.  Psychiatric/Behavioral: Positive for dysphoric mood. The patient is not nervous/anxious.        Objective:    BP (!) 118/58 (BP Location: Left Arm, Patient Position: Sitting, Cuff Size: Normal)   Pulse 76   Temp 98 F (36.7 C) (Oral)   Ht 5\' 5"  (1.651 m)   Wt 163 lb (73.9 kg)   SpO2 96%   BMI 27.12 kg/m   Wt Readings from Last 3 Encounters:  12/10/17 163 lb (73.9 kg)  11/13/17 160 lb (72.6 kg)  10/23/17 159 lb 6 oz (72.3 kg)    Physical Exam  Constitutional: She is oriented to person, place, and time. She appears well-developed and well-nourished. No distress.  HENT:  Head: Normocephalic and atraumatic.  Right Ear: Hearing, tympanic membrane, external ear and ear canal normal.  Left Ear: Hearing, tympanic membrane, external ear and ear canal normal.  Nose: Nose normal.  Mouth/Throat: Uvula is midline, oropharynx is clear and moist and mucous membranes are normal. No oropharyngeal exudate, posterior oropharyngeal edema or posterior oropharyngeal erythema.  Eyes: Conjunctivae and EOM are normal. Pupils are equal, round, and reactive to light. No scleral icterus.  Neck: Normal range of motion. Neck  supple. Carotid bruit is not present. No thyromegaly present.  Cardiovascular: Normal rate, regular rhythm, normal heart sounds and intact distal pulses.  No murmur heard. Pulses:      Radial pulses are 2+ on the right side, and 2+ on the left side.  Pulmonary/Chest: Effort normal and breath sounds normal. No respiratory distress. She has no wheezes. She has no rales.  Abdominal: Soft. Bowel sounds are normal. She exhibits no distension and no mass. There is no tenderness. There is no rebound and no guarding.  Musculoskeletal: Normal range of motion. She exhibits no edema.  Lymphadenopathy:    She has no cervical adenopathy.  Neurological: She is alert and oriented to person, place, and time.  CN grossly intact, station and gait intact Akithesia, resting tremor  Skin: Skin is warm and dry. No rash noted.  Psychiatric: She has a normal mood and affect. Her behavior is  normal. Judgment and thought content normal.  Nursing note and vitals reviewed.  Results for orders placed or performed in visit on 11/13/17  Calcium  Result Value Ref Range   Calcium 9.5 8.4 - 10.5 mg/dL   Lab Results  Component Value Date   CHOL 180 09/18/2017   HDL 57.60 09/18/2017   LDLCALC 107 (H) 09/18/2017   LDLDIRECT 105.5 07/20/2010   TRIG 76.0 09/18/2017   CHOLHDL 3 09/18/2017       Assessment & Plan:   Problem List Items Addressed This Visit    Advanced care planning/counseling discussion    Advanced directives: Discussing with son [name] from [place] who would be HCPOA. Has packet at home.       B12 deficiency    b12 shot today      Chronic cough    Chronic cough not likely PD-aspiration related according to neurology.  Will refer back to GI for recheck. ?GERD related chronic cough not improving with pantoprazole/pepcid.  Planned colonoscopy 06/2018.       Relevant Orders   Ambulatory referral to Gastroenterology   GERD    Treated with daily pantoprazole and pepcid nightly.        Relevant Orders   Ambulatory referral to Gastroenterology   Health maintenance examination - Primary    Preventative protocols reviewed and updated unless pt declined. Discussed healthy diet and lifestyle.       HLD (hyperlipidemia)    Chronic, stable off statin. The 10-year ASCVD risk score Mikey Bussing DC Brooke Bonito., et al., 2013) is: 7%   Values used to calculate the score:     Age: 35 years     Sex: Female     Is Non-Hispanic African American: No     Diabetic: No     Tobacco smoker: No     Systolic Blood Pressure: 093 mmHg     Is BP treated: No     HDL Cholesterol: 57.6 mg/dL     Total Cholesterol: 180 mg/dL       MDD (major depressive disorder), single episode, moderate (HCC)    Continue sertraline and klonopin. Patent examiner.       Osteoporosis    DEXA stable 2014 --> 2018. Continue prolia (started 03/2017)      Relevant Medications   Calcium Carbonate-Vitamin D (CALCIUM 600/VITAMIN D) 600-400 MG-UNIT chew tablet   Parkinson's disease ([place])    Appreciate neuro care.       Relevant Medications   clonazePAM (KLONOPIN) 0.5 MG tablet       Follow up plan: Return in about 4 months (around 04/09/2018) for follow up visit.  [name], MD

## 2017-12-10 NOTE — Addendum Note (Signed)
Addended by: Brenton Grills on: 02/04/2161 44:69 PM   Modules accepted: Orders

## 2017-12-10 NOTE — Assessment & Plan Note (Signed)
Advanced directives: Discussing with son [name] from [place] who would be HCPOA. Has packet at home.

## 2017-12-11 ENCOUNTER — Telehealth: Payer: Self-pay

## 2017-12-11 ENCOUNTER — Ambulatory Visit: Payer: Medicare Other | Admitting: Physical Therapy

## 2017-12-11 ENCOUNTER — Encounter: Payer: Self-pay | Admitting: Physical Therapy

## 2017-12-11 DIAGNOSIS — M6281 Muscle weakness (generalized): Secondary | ICD-10-CM

## 2017-12-11 DIAGNOSIS — R262 Difficulty in walking, not elsewhere classified: Secondary | ICD-10-CM | POA: Diagnosis not present

## 2017-12-11 NOTE — Telephone Encounter (Signed)
Dr. Stark will you accept this patient? 

## 2017-12-11 NOTE — Telephone Encounter (Signed)
Attempted to return pt's call.  Left message on vm per dpr informing pt I do not see any messages, labs or imaging results we may have called about.

## 2017-12-11 NOTE — Telephone Encounter (Signed)
Copied from [place] [id number]. Topic: General - Other >> Dec 11, 2017  9:40 AM Neva Seat wrote: Pt returned missed call.  Please call pt back after 12 noon today.

## 2017-12-11 NOTE — Telephone Encounter (Signed)
OK 

## 2017-12-11 NOTE — Therapy (Signed)
[place], [place], [address] Phone: [phone]   Fax:  [phone]  Physical Therapy Treatment  Patient Details  Name: [name] MRN: [id number] Date of Birth: 11/06/1946 Referring Provider: [name]   Encounter Date: 12/11/2017  PT End of Session - 12/11/17 1052    Visit Number  2    Date for PT Re-Evaluation  02/01/18    PT Start Time  9323    PT Stop Time  1056    PT Time Calculation (min)  41 min    Activity Tolerance  Patient tolerated treatment well    Behavior During Therapy  [facility] for tasks assessed/performed       Past Medical History:  Diagnosis Date  . ANEMIA-NOS 09/25/2007  . Anxiety   . Arthritis   . B12 DEFICIENCY 05/03/2007  . CAD (coronary artery disease) 01/2010   MI, Nishan  . Cardiomyopathy ([place]) 02/08/2010   H/o this 2012 after urosepsis, no recurrence.   . Depression   . FIBROMYALGIA 05/03/2007  . GERD 02/22/2010  . Glaucoma 02/2013   [facility]  . History of CHF (congestive heart failure) 01/2010  . History of colon polyps 2004  . HYPERLIPIDEMIA 12/19/2007  . HYPOTENSION, ORTHOSTATIC 12/06/2008  . Interstitial cystitis    Ottelin now Dr Amalia Hailey  . Lupus (systemic lupus erythematosus) ([place]) 02/08/2010  . MCTD (mixed connective tissue disease) ([place]) 02/08/2010  . OSTEOPOROSIS 08/2009   bisphosphonate on hold 2/2 dysphagia, on reclast done in August each year  . Parkinson's disease ([place]) 08/25/2015   Dx Dr Carles Collet 07/2015   . 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 Takotsubo cardiomyopathy 2008   due to E coli urosepsis    Past Surgical History:  Procedure Laterality Date  . ABDOMINAL HYSTERECTOMY  1970s   IUD infection - first partial then with oophorectomy (cysts), complication - low blood pressure  . CATARACT EXTRACTION Right   . CHOLECYSTECTOMY     complication - low blood pressure  . COLONOSCOPY  06/2008   h/o polyps but latest WNL, rec  rpt 10 yrs Olevia Perches)  . CYSTOSCOPY  12/2013   abx treatment for recurrent cystitis  . DEXA  04/2013   T -2.9 @ femur, -1.6 @ spine  . DEXA  04/2017   T -2.9 hip, -0.7 spine    There were no vitals filed for this visit.  Subjective Assessment - 12/11/17 1015    Subjective  Pt reports no stumbles of falls "Got my B12 shot yesterday so I feel good today"    Currently in Pain?  No/denies    Pain Score  0-No pain                      [facility] Adult PT Treatment/Exercise - 12/11/17 0001      High Level Balance   High Level Balance Activities  Side stepping;Backward walking;Negotiating over obstacles    High Level Balance Comments  Alt box taps 6in 2x10       Exercises   Exercises  Lumbar;Knee/Hip      Lumbar Exercises: Aerobic   Stationary Bike  L0 x6 min      Lumbar Exercises: Standing   Row  Theraband;Strengthening;10 reps    Theraband Level (Row)  Level 3 (Green)    Shoulder Extension  Strengthening;20 reps;Theraband;Both    Theraband Level (Shoulder Extension)  Level 3 (Green)  Knee/Hip Exercises: Machines for Strengthening   Cybex Knee Flexion  20lb 2x10               PT Short Term Goals - 12/11/17 1056      PT SHORT TERM GOAL #1   Title  independent iwth initial HEP    Status  Partially Met        PT Long Term Goals - 12/04/17 1156      PT LONG TERM GOAL #1   Title  Pt will improve TUG to 13.5 seconds to decrease fall risk    Time  8    Period  Weeks    Status  New      PT LONG TERM GOAL #2   Title  Pt will improve Berg balance score to 49 or greater to decrease fall risk    Time  8    Period  Weeks    Status  New      PT LONG TERM GOAL #3   Title  Pt will be independent for home exercises    Time  8    Period  Weeks    Status  New      PT LONG TERM GOAL #4   Title  pt will improve R quad strength to 4/5 or greater    Time  8    Period  Weeks    Status  New            Plan - 12/11/17 1053    Clinical Impression  Statement  Pt able to complete all of today's exercises, she has some unsteady ness with alt box taps. Fatigues quick when she is on her feel for extended periods of time. Reports a little pain in her back on the R side. Some clarence issues when stepping over foam roll.     Rehab Potential  Good    PT Frequency  2x / week    PT Duration  8 weeks    PT Treatment/Interventions  ADLs/Self Care Home Management;Electrical Stimulation;Moist Heat;Gait training;Stair training;Functional mobility training;Therapeutic activities;Therapeutic exercise;Balance training;Neuromuscular re-education;Patient/family education    PT Next Visit Plan  slowly do gym for strength and balance       Patient will benefit from skilled therapeutic intervention in order to improve the following deficits and impairments:  Abnormal gait, Decreased range of motion, Difficulty walking, Cardiopulmonary status limiting activity, Decreased endurance, Decreased balance, Decreased mobility, Decreased strength  Visit Diagnosis: Difficulty in walking, not elsewhere classified  Muscle weakness (generalized)     Problem List Patient Active Problem List   Diagnosis Date Noted  . Dysphagia 10/09/2017  . Chronic cough 07/16/2017  . Right foot pain 04/28/2016  . Skin rash 04/28/2016  . GAD (generalized anxiety disorder) 04/28/2016  . Chronic insomnia 03/07/2016  . Left Achilles tendinitis 12/08/2015  . Parkinson's disease ([place]) 08/25/2015  . Advanced care planning/counseling discussion 05/26/2015  . Family circumstance 02/23/2015  . Health maintenance examination 05/21/2014  . MDD (major depressive disorder), single episode, moderate ([place]) 11/12/2013  . Dysuria 09/02/2013  . Back pain 05/31/2013  . Medicare annual wellness visit, subsequent 05/20/2013  . HLD (hyperlipidemia) 05/20/2013  . Vitamin D deficiency 05/09/2013  . Recurrent UTI 01/03/2011  . Syncope 01/03/2011  . RASH-NONVESICULAR 05/11/2010  . GERD 02/22/2010   . CAD (coronary artery disease) 01/25/2010  . Osteoporosis 11/10/2009  . HYPOTENSION, ORTHOSTATIC 12/06/2008  . Anemia 09/25/2007  . B12 deficiency 05/03/2007  . Fibromyalgia 05/03/2007    Newman Nip  Johney Frame, PTA 12/11/2017, 10:56 AM  [place], [place], [address] Phone: [phone]   Fax:  [phone]  Name: CINTHIA RODDEN MRN: [id number] Date of Birth: [birth year]

## 2017-12-12 ENCOUNTER — Telehealth: Payer: Self-pay | Admitting: Neurology

## 2017-12-12 NOTE — Telephone Encounter (Signed)
-----   Message from Annamaria Helling, [place] sent at 11/13/2017 11:19 AM EST ----- Call to see how doing on Northera

## 2017-12-12 NOTE — Telephone Encounter (Signed)
Called patient to see how she is doing on Northera 300 mg TID. Left message on machine for patient to call back.

## 2017-12-12 NOTE — Telephone Encounter (Signed)
LM on Vmail to call back to schedule with Dr. Fuller Plan

## 2017-12-13 NOTE — Telephone Encounter (Signed)
Patient called back. She states she is actually taking Northera 600 mg TID. She is still having trouble with blood pressure dropping but it is much less frequent. She will stay on that dosage and call if it becomes more frequent. Dr. Carles Collet - please advise if any other advise.

## 2017-12-13 NOTE — Telephone Encounter (Signed)
Wearing compression binder?

## 2017-12-13 NOTE — Telephone Encounter (Signed)
Spoke with patient. She states after she moved she forgot about her binder and hasn't been wearing it. She will start again and see if it helps.   Dr. Carles ColletJuluis Rainier.

## 2017-12-14 ENCOUNTER — Encounter: Payer: Self-pay | Admitting: Family Medicine

## 2017-12-15 MED ORDER — OSELTAMIVIR PHOSPHATE 75 MG PO CAPS: 75.0000 mg | ORAL_CAPSULE | Freq: Every day | ORAL | 0 refills | Status: DC

## 2017-12-17 ENCOUNTER — Telehealth: Payer: Self-pay

## 2017-12-17 ENCOUNTER — Encounter: Payer: Self-pay | Admitting: Family Medicine

## 2017-12-17 NOTE — Telephone Encounter (Signed)
[name] with [facility] said new law started 11/27/17 that benzo and opiod prescribed together the pharmacy needs verbal order for meds and why pt is taking both meds and dx code. [name] request cb.

## 2017-12-18 ENCOUNTER — Ambulatory Visit: Payer: Medicare Other | Admitting: Physical Therapy

## 2017-12-18 ENCOUNTER — Encounter: Payer: Self-pay | Admitting: Physical Therapy

## 2017-12-18 ENCOUNTER — Telehealth: Payer: Self-pay | Admitting: Neurology

## 2017-12-18 DIAGNOSIS — R262 Difficulty in walking, not elsewhere classified: Secondary | ICD-10-CM | POA: Diagnosis not present

## 2017-12-18 DIAGNOSIS — M6281 Muscle weakness (generalized): Secondary | ICD-10-CM

## 2017-12-18 NOTE — Therapy (Signed)
[place], [place], [address] Phone: [phone]   Fax:  [phone]  Physical Therapy Treatment  Patient Details  Name: [name] MRN: [id number] Date of Birth: January 09, 1946 Referring Provider: [name]   Encounter Date: 12/18/2017  PT End of Session - 12/18/17 1340    Visit Number  3    Date for PT Re-Evaluation  02/01/18    PT Start Time  1300    PT Stop Time  1343    PT Time Calculation (min)  43 min       Past Medical History:  Diagnosis Date  . ANEMIA-NOS 09/25/2007  . Anxiety   . Arthritis   . B12 DEFICIENCY 05/03/2007  . CAD (coronary artery disease) 01/2010   MI, Nishan  . Cardiomyopathy ([place]) 02/08/2010   H/o this 2012 after urosepsis, no recurrence.   . Depression   . FIBROMYALGIA 05/03/2007  . GERD 02/22/2010  . Glaucoma 02/2013   [facility]  . History of CHF (congestive heart failure) 01/2010  . History of colon polyps 2004  . HYPERLIPIDEMIA 12/19/2007  . HYPOTENSION, ORTHOSTATIC 12/06/2008  . Interstitial cystitis    Ottelin now Dr Amalia Hailey  . Lupus (systemic lupus erythematosus) ([place]) 02/08/2010  . MCTD (mixed connective tissue disease) ([place]) 02/08/2010  . OSTEOPOROSIS 08/2009   bisphosphonate on hold 2/2 dysphagia, on reclast done in August each year  . Parkinson's disease ([place]) 08/25/2015   Dx Dr Carles Collet 07/2015   . 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 Takotsubo cardiomyopathy 2008   due to E coli urosepsis    Past Surgical History:  Procedure Laterality Date  . ABDOMINAL HYSTERECTOMY  1970s   IUD infection - first partial then with oophorectomy (cysts), complication - low blood pressure  . CATARACT EXTRACTION Right   . CHOLECYSTECTOMY     complication - low blood pressure  . COLONOSCOPY  06/2008   h/o polyps but latest WNL, rec rpt 10 yrs Olevia Perches)  . CYSTOSCOPY  12/2013   abx treatment for recurrent cystitis  . DEXA  04/2013   T -2.9 @ femur,  -1.6 @ spine  . DEXA  04/2017   T -2.9 hip, -0.7 spine    There were no vitals filed for this visit.  Subjective Assessment - 12/18/17 1304    Subjective  "I am exhausted"    Currently in Pain?  Yes    Pain Score  5     Pain Location  Back                      [facility] Adult PT Treatment/Exercise - 12/18/17 0001      High Level Balance   High Level Balance Activities  Backward walking;Marching forwards;Side stepping;Negotiating over obstacles    High Level Balance Comments  Ball toss,       Lumbar Exercises: Aerobic   Nustep  L4 x 6 min      Lumbar Exercises: Machines for Strengthening   Other Lumbar Machine Exercise  Rows 15lb and lats 20lb 2x10      Knee/Hip Exercises: Machines for Strengthening   Cybex Knee Extension  5lb 2x10     Cybex Knee Flexion  20lb 2x10    Cybex Leg Press  20lb x10 reprots some LBP on R side                PT Short Term Goals -  12/18/17 1305      PT SHORT TERM GOAL #1   Title  independent iwth initial HEP    Status  Achieved        PT Long Term Goals - 12/04/17 1156      PT LONG TERM GOAL #1   Title  Pt will improve TUG to 13.5 seconds to decrease fall risk    Time  8    Period  Weeks    Status  New      PT LONG TERM GOAL #2   Title  Pt will improve Berg balance score to 49 or greater to decrease fall risk    Time  8    Period  Weeks    Status  New      PT LONG TERM GOAL #3   Title  Pt will be independent for home exercises    Time  8    Period  Weeks    Status  New      PT LONG TERM GOAL #4   Title  pt will improve R quad strength to 4/5 or greater    Time  8    Period  Weeks    Status  New            Plan - 12/18/17 1341    Clinical Impression Statement  Pt has no issues with today's balance activities shows good strength ands ROM with machine level exercises. She does reports som LBP while on leg press.     Rehab Potential  Good    PT Frequency  2x / week    PT Duration  8 weeks    PT  Treatment/Interventions  ADLs/Self Care Home Management;Electrical Stimulation;Moist Heat;Gait training;Stair training;Functional mobility training;Therapeutic activities;Therapeutic exercise;Balance training;Neuromuscular re-education;Patient/family education    PT Next Visit Plan  slowly do gym for strength and balance, Le flexability       Patient will benefit from skilled therapeutic intervention in order to improve the following deficits and impairments:  Abnormal gait, Decreased range of motion, Difficulty walking, Cardiopulmonary status limiting activity, Decreased endurance, Decreased balance, Decreased mobility, Decreased strength  Visit Diagnosis: Difficulty in walking, not elsewhere classified  Muscle weakness (generalized)     Problem List Patient Active Problem List   Diagnosis Date Noted  . Dysphagia 10/09/2017  . Chronic cough 07/16/2017  . Right foot pain 04/28/2016  . Skin rash 04/28/2016  . GAD (generalized anxiety disorder) 04/28/2016  . Chronic insomnia 03/07/2016  . Left Achilles tendinitis 12/08/2015  . Parkinson's disease ([place]) 08/25/2015  . Advanced care planning/counseling discussion 05/26/2015  . Family circumstance 02/23/2015  . Health maintenance examination 05/21/2014  . MDD (major depressive disorder), single episode, moderate ([place]) 11/12/2013  . Dysuria 09/02/2013  . DDD (degenerative disc disease), lumbar 05/31/2013  . Medicare annual wellness visit, subsequent 05/20/2013  . HLD (hyperlipidemia) 05/20/2013  . Vitamin D deficiency 05/09/2013  . Recurrent UTI 01/03/2011  . Syncope 01/03/2011  . RASH-NONVESICULAR 05/11/2010  . GERD 02/22/2010  . CAD (coronary artery disease) 01/25/2010  . Osteoporosis 11/10/2009  . HYPOTENSION, ORTHOSTATIC 12/06/2008  . Anemia 09/25/2007  . B12 deficiency 05/03/2007  . Fibromyalgia 05/03/2007    Scot Jun, PTA 12/18/2017, 1:44 PM  [place], [place], [address] Phone: [phone]   Fax:  [phone]  Name: [name] MRN: [id number] Date of Birth: [birth year]

## 2017-12-18 NOTE — Telephone Encounter (Signed)
Got a fax that patient called answering service about daughter who has flu and asked what to do.  It appears she emailed Dr. Danise Mina over the weekend and he took care of calling her in tamiflu.  Answering service did tell her to contact PCP so likely why she did that.

## 2017-12-18 NOTE — Telephone Encounter (Signed)
Benzo indication - GAD F41.1 Opioid indication - lumbar degenerative disc disease M51.36 with fibromyalgia M79.7.  Ok to be on both.

## 2017-12-18 NOTE — Telephone Encounter (Signed)
Pt called and said she has no energy and wondered if it is because of the blood pressure medicine that Dr [name] put her on

## 2017-12-18 NOTE — Telephone Encounter (Addendum)
Spoke with [facility] [address], says due to pt having Medicare part D, need dx codes for controlled rxs clonazepam and tramadol.  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

## 2017-12-18 NOTE — Telephone Encounter (Signed)
Spoke with pharmacist at [facility]. Says rxs were filled with the codes I provided this morning but we can use the codes Dr. Darnell Level gave for next refill.

## 2017-12-18 NOTE — Telephone Encounter (Signed)
I put her on med to raise up her BP (if she is talking about northera) and not lower it so that should not affect her energy.  Is she checking her BP?  Low BPs can cause this.  Is she wearing binder as we discussed?

## 2017-12-19 NOTE — Telephone Encounter (Signed)
Spoke with patient. She has been checking her blood pressure. It has been better, but some days it is still low. She has lost her binder and waiting for her paycheck to get a new one. She will give it a couple weeks after wearing that to see if weakness is better with stabilized blood pressure and let us know.   Patient also recently exposed to the flu and on tamiflu, which may be contributing. She will call back in a couple weeks.

## 2017-12-24 ENCOUNTER — Other Ambulatory Visit: Payer: Self-pay | Admitting: Neurology

## 2017-12-25 ENCOUNTER — Ambulatory Visit: Payer: Medicare Other | Admitting: Physical Therapy

## 2017-12-28 HISTORY — PX: ESOPHAGOGASTRODUODENOSCOPY: SHX1529

## 2017-12-31 ENCOUNTER — Encounter: Payer: Self-pay | Admitting: Physical Therapy

## 2017-12-31 ENCOUNTER — Ambulatory Visit: Payer: Medicare Other | Attending: Neurology | Admitting: Physical Therapy

## 2017-12-31 DIAGNOSIS — M6281 Muscle weakness (generalized): Secondary | ICD-10-CM

## 2017-12-31 DIAGNOSIS — R2689 Other abnormalities of gait and mobility: Secondary | ICD-10-CM | POA: Diagnosis present

## 2017-12-31 DIAGNOSIS — R279 Unspecified lack of coordination: Secondary | ICD-10-CM | POA: Insufficient documentation

## 2017-12-31 DIAGNOSIS — R2681 Unsteadiness on feet: Secondary | ICD-10-CM | POA: Insufficient documentation

## 2017-12-31 DIAGNOSIS — R262 Difficulty in walking, not elsewhere classified: Secondary | ICD-10-CM | POA: Insufficient documentation

## 2017-12-31 NOTE — Therapy (Signed)
[place], [place], [address] Phone: [phone]   Fax:  [phone]  Physical Therapy Treatment  Patient Details  Name: [name] MRN: [id number] Date of Birth: 07-14-1946 Referring Provider: [name]   Encounter Date: 12/31/2017  PT End of Session - 12/31/17 0934    Visit Number  4    Date for PT Re-Evaluation  02/01/18    PT Start Time  0843    PT Stop Time  0930    PT Time Calculation (min)  47 min    Activity Tolerance  Patient tolerated treatment well    Behavior During Therapy  [facility] for tasks assessed/performed       Past Medical History:  Diagnosis Date  . ANEMIA-NOS 09/25/2007  . Anxiety   . Arthritis   . B12 DEFICIENCY 05/03/2007  . CAD (coronary artery disease) 01/2010   MI, Nishan  . Cardiomyopathy ([place]) 02/08/2010   H/o this 2012 after urosepsis, no recurrence.   . Depression   . FIBROMYALGIA 05/03/2007  . GERD 02/22/2010  . Glaucoma 02/2013   [facility]  . History of CHF (congestive heart failure) 01/2010  . History of colon polyps 2004  . HYPERLIPIDEMIA 12/19/2007  . HYPOTENSION, ORTHOSTATIC 12/06/2008  . Interstitial cystitis    Ottelin now Dr Amalia Hailey  . Lupus (systemic lupus erythematosus) ([place]) 02/08/2010  . MCTD (mixed connective tissue disease) ([place]) 02/08/2010  . OSTEOPOROSIS 08/2009   bisphosphonate on hold 2/2 dysphagia, on reclast done in August each year  . Parkinson's disease ([place]) 08/25/2015   Dx Dr Carles Collet 07/2015   . 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 Takotsubo cardiomyopathy 2008   due to E coli urosepsis    Past Surgical History:  Procedure Laterality Date  . ABDOMINAL HYSTERECTOMY  1970s   IUD infection - first partial then with oophorectomy (cysts), complication - low blood pressure  . CATARACT EXTRACTION Right   . CHOLECYSTECTOMY     complication - low blood pressure  . COLONOSCOPY  06/2008   h/o polyps but latest WNL, rec rpt  10 yrs Olevia Perches)  . CYSTOSCOPY  12/2013   abx treatment for recurrent cystitis  . DEXA  04/2013   T -2.9 @ femur, -1.6 @ spine  . DEXA  04/2017   T -2.9 hip, -0.7 spine    There were no vitals filed for this visit.  Subjective Assessment - 12/31/17 0844    Subjective  Pt. reports falling on Saturday and lost balance when trying get into recliner and fell, her grandson helped her get up. Pt. doesn't remember exactly what caused the fall and says she just lost her balance. Pt. reports some pain in the lower back but not out of the ordinary.    Currently in Pain?  Yes    Pain Score  5     Pain Location  Back                      [facility] Adult PT Treatment/Exercise - 12/31/17 0001      High Level Balance   High Level Balance Activities  Marching forwards;Backward walking;Side stepping;Tandem walking pt. very unsteady with tandem walking but able to catch self    High Level Balance Comments  alt box taps 6" step, standing on airex pad (in the corner of a room for extra guarding) reaching for moving object pt. caught foot on  bos once, difficulty with airex ex      Lumbar Exercises: Aerobic   Nustep  L4 x 6 min      Knee/Hip Exercises: Machines for Strengthening   Cybex Knee Extension  5lb 2x10     Cybex Knee Flexion  20lb 2x10      Knee/Hip Exercises: Standing   Hip Abduction  Stengthening;Both;2 sets;10 reps;Limitations    Abduction Limitations  2#    Hip Extension  Stengthening;Both;2 sets;10 reps;Limitations    Extension Limitations  2#      Knee/Hip Exercises: Seated   Ball Squeeze  2x10    Other Seated Knee/Hip Exercises  resisted DF with yellow t band both LE 2x10    Sit to Sand  2 sets;10 reps;without UE support pt. lost balance on the 3rd rep of the 1st set               PT Short Term Goals - 12/18/17 1305      PT SHORT TERM GOAL #1   Title  independent iwth initial HEP    Status  Achieved        PT Long Term Goals - 12/31/17 0942      PT  LONG TERM GOAL #3   Title  Pt will be independent for home exercises    Time  8    Period  Weeks    Status  On-going            Plan - 12/31/17 0934    Clinical Impression Statement  Pt. tolerated ex well today. Pt. needed cues to not laterally shift during standing hip ABD and to not go into lumbar forward flexion during standing hip ext ex. Pt. had a lot of difficulty with tandem walking and needed lots of guarding due to losing balance often and being unsteady, however, pt. was able to catch herself. Pt. was fatigued after the tandem walking. Pt. caught foot on box during alt toe taps on 6" step which threw off her balance and was fatigued afterwards. To continue working on balance, we used an airex pad in the corner of a treatment room so the walls could be an extra guard and then pt. would reach for moving objects. Overall, pt. did well with airex ex but it was challenging and would have to correct herself often but she was able to do it. There was only a small handful of times when I had to help her catch her balance. Pt. had greatest difficulty with reaching for a lower object on either side. Due to pt. catching foot on alt toe taps on step, we did resisted DF with a yellow tband which was very challenging to the pt. and she said it was difficult and tiring a few times. Pt. seems to have unsteady gait normally loses balance but is able to catch herself and correct.     Rehab Potential  Good    PT Frequency  2x / week    PT Duration  8 weeks    PT Treatment/Interventions  ADLs/Self Care Home Management;Electrical Stimulation;Moist Heat;Gait training;Stair training;Functional mobility training;Therapeutic activities;Therapeutic exercise;Balance training;Neuromuscular re-education;Patient/family education    PT Next Visit Plan  continue balance ex and strengthening    Consulted and Agree with Plan of Care  Patient       Patient will benefit from skilled therapeutic intervention in order  to improve the following deficits and impairments:  Abnormal gait, Decreased range of motion, Difficulty walking, Cardiopulmonary status limiting activity, Decreased  endurance, Decreased balance, Decreased mobility, Decreased strength  Visit Diagnosis: Difficulty in walking, not elsewhere classified  Muscle weakness (generalized)  Other abnormalities of gait and mobility  Unsteady gait  Lack of coordination     Problem List Patient Active Problem List   Diagnosis Date Noted  . Dysphagia 10/09/2017  . Chronic cough 07/16/2017  . Right foot pain 04/28/2016  . Skin rash 04/28/2016  . GAD (generalized anxiety disorder) 04/28/2016  . Chronic insomnia 03/07/2016  . Left Achilles tendinitis 12/08/2015  . Parkinson's disease ([place]) 08/25/2015  . Advanced care planning/counseling discussion 05/26/2015  . Family circumstance 02/23/2015  . Health maintenance examination 05/21/2014  . MDD (major depressive disorder), single episode, moderate ([place]) 11/12/2013  . Dysuria 09/02/2013  . DDD (degenerative disc disease), lumbar 05/31/2013  . Medicare annual wellness visit, subsequent 05/20/2013  . HLD (hyperlipidemia) 05/20/2013  . Vitamin D deficiency 05/09/2013  . Recurrent UTI 01/03/2011  . Syncope 01/03/2011  . RASH-NONVESICULAR 05/11/2010  . GERD 02/22/2010  . CAD (coronary artery disease) 01/25/2010  . Osteoporosis 11/10/2009  . HYPOTENSION, ORTHOSTATIC 12/06/2008  . Anemia 09/25/2007  . B12 deficiency 05/03/2007  . Fibromyalgia 05/03/2007    Juliann Pulse SPT 12/31/2017, 9:46 AM  [place] [address] [place], [place], [address] Phone: [phone]   Fax:  [phone]  Name: [name] MRN: [id number] Date of Birth: 04-06-1946

## 2018-01-07 ENCOUNTER — Encounter: Payer: Self-pay | Admitting: Physical Therapy

## 2018-01-07 ENCOUNTER — Ambulatory Visit: Payer: Medicare Other | Admitting: Physical Therapy

## 2018-01-07 DIAGNOSIS — R2681 Unsteadiness on feet: Secondary | ICD-10-CM

## 2018-01-07 DIAGNOSIS — R262 Difficulty in walking, not elsewhere classified: Secondary | ICD-10-CM

## 2018-01-07 DIAGNOSIS — M6281 Muscle weakness (generalized): Secondary | ICD-10-CM

## 2018-01-07 DIAGNOSIS — R2689 Other abnormalities of gait and mobility: Secondary | ICD-10-CM

## 2018-01-07 DIAGNOSIS — R279 Unspecified lack of coordination: Secondary | ICD-10-CM

## 2018-01-07 NOTE — Therapy (Signed)
During this treatment session, the therapist was present, participating in and directing the treatment. [place], [place], [address] Phone: [phone]   Fax:  [phone]  Physical Therapy Treatment  Patient Details  Name: [name] MRN: [id number] Date of Birth: 1946/06/01 Referring Provider: [name]   Encounter Date: 01/07/2018  PT End of Session - 01/07/18 1403    Visit Number  5    Date for PT Re-Evaluation  02/01/18    PT Start Time  1310    PT Stop Time  1355    PT Time Calculation (min)  45 min    Activity Tolerance  Patient tolerated treatment well    Behavior During Therapy  [facility] for tasks assessed/performed       Past Medical History:  Diagnosis Date  . ANEMIA-NOS 09/25/2007  . Anxiety   . Arthritis   . B12 DEFICIENCY 05/03/2007  . CAD (coronary artery disease) 01/2010   MI, Nishan  . Cardiomyopathy ([place]) 02/08/2010   H/o this 2012 after urosepsis, no recurrence.   . Depression   . FIBROMYALGIA 05/03/2007  . GERD 02/22/2010  . Glaucoma 02/2013   [facility]  . History of CHF (congestive heart failure) 01/2010  . History of colon polyps 2004  . HYPERLIPIDEMIA 12/19/2007  . HYPOTENSION, ORTHOSTATIC 12/06/2008  . Interstitial cystitis    Ott[place] now Dr Amalia Hailey  . Lupus (systemic lupus erythematosus) ([place]) 02/08/2010  . MCTD (mixed connective tissue disease) ([place]) 02/08/2010  . OSTEOPOROSIS 08/2009   bisphosphonate on hold 2/2 dysphagia, on reclast done in August each year  . Parkinson's disease ([place]) 08/25/2015   Dx Dr Carles Collet 07/2015   . 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 Takotsubo cardiomyopathy 2008   due to E coli urosepsis    Past Surgical History:  Procedure Laterality Date  . ABDOMINAL HYSTERECTOMY  1970s   IUD infection - first partial then with oophorectomy (cysts), complication - low blood pressure  . CATARACT EXTRACTION Right   .  CHOLECYSTECTOMY     complication - low blood pressure  . COLONOSCOPY  06/2008   h/o polyps but latest WNL, rec rpt 10 yrs Olevia Perches)  . CYSTOSCOPY  12/2013   abx treatment for recurrent cystitis  . DEXA  04/2013   T -2.9 @ femur, -1.6 @ spine  . DEXA  04/2017   T -2.9 hip, -0.7 spine    There were no vitals filed for this visit.  Subjective Assessment - 01/07/18 1310    Subjective  Pt. reports using bike this morning for 15 mins. Pt. reports R hip/low back in some pain last night but put heat on it and that seemed to help. Pt. reports no falls/stumbles since last reported at tx.     Currently in Pain?  Yes    Pain Score  4     Pain Location  Back         [facility] PT Assessment - 01/07/18 0001      Timed Up and Go Test   Normal TUG (seconds)  15                  [facility] Adult PT Treatment/Exercise - 01/07/18 0001      High Level Balance   High Level Balance Activities  Marching forwards;Side stepping;Tandem walking;Marching backwards;Negotiating over obstacles    High Level Balance Comments  alt step up 6", lateral  step up 4", standing on airex pad in corner of room reaching for moving object       Lumbar Exercises: Aerobic   Nustep  L4 x 6 min      Lumbar Exercises: Machines for Strengthening   Other Lumbar Machine Exercise  Rows 10# and lats 20# 2x10      Knee/Hip Exercises: Machines for Strengthening   Cybex Knee Extension  5# 2x10     Cybex Knee Flexion  20# 2x12    Cybex Leg Press  20# x10 pt. reports increase in R LBP      Knee/Hip Exercises: Standing   Hip Flexion  Stengthening;Both;2 sets;10 reps;Limitations    Hip Flexion Limitations  2.5#    Hip Abduction  Stengthening;Both;2 sets;Left;10 reps    Abduction Limitations  2.5#    Hip Extension  Stengthening;Both;2 sets;10 reps;Limitations    Extension Limitations  2.5#      Knee/Hip Exercises: Seated   Ball Squeeze  20 reps     Sit to [employer]  2 sets;10 reps;without UE support               PT  Short Term Goals - 12/18/17 1305      PT SHORT TERM GOAL #1   Title  independent iwth initial HEP    Status  Achieved        PT Long Term Goals - 01/07/18 1401      PT LONG TERM GOAL #1   Title  Pt will improve TUG to 13.5 seconds to decrease fall risk    Baseline  15 seconds    Time  8    Period  Weeks    Status  On-going            Plan - 01/07/18 1355    Clinical Impression Statement  Pt. tolerated tx well today. Pt. needs cues to not side bend during standing hip ABD and to not go into lumbar forward flexion during standing hip ext. exercise. Pt. did better with tandem walking today, still needing lots of guarding due to some unsteadiness but pt. corrected self well today. When doing tandem walking, pt. stride length is a little longer than desired but the ex is still challenging to pt. Pt. did very well on airex pad today (in the corner of a tx room for extra guarding) reaching for moving object that pt. tolerated me moving an object at an increase in speed without pt. losing balance. Plan to progress balance ex next treatment.     Rehab Potential  Good    PT Frequency  2x / week    PT Duration  8 weeks    PT Treatment/Interventions  ADLs/Self Care Home Management;Electrical Stimulation;Moist Heat;Gait training;Stair training;Functional mobility training;Therapeutic activities;Therapeutic exercise;Balance training;Neuromuscular re-education;Patient/family education    PT Next Visit Plan  continue balance ex and strengthening    Consulted and Agree with Plan of Care  Patient       Patient will benefit from skilled therapeutic intervention in order to improve the following deficits and impairments:  Abnormal gait, Decreased range of motion, Difficulty walking, Cardiopulmonary status limiting activity, Decreased endurance, Decreased balance, Decreased mobility, Decreased strength  Visit Diagnosis: Difficulty in walking, not elsewhere classified  Muscle weakness  (generalized)  Other abnormalities of gait and mobility  Unsteady gait  Lack of coordination     Problem List Patient Active Problem List   Diagnosis Date Noted  . Dysphagia 10/09/2017  . Chronic cough 07/16/2017  . Right  foot pain 04/28/2016  . Skin rash 04/28/2016  . GAD (generalized anxiety disorder) 04/28/2016  . Chronic insomnia 03/07/2016  . Left Achilles tendinitis 12/08/2015  . Parkinson's disease ([place]) 08/25/2015  . Advanced care planning/counseling discussion 05/26/2015  . Family circumstance 02/23/2015  . Health maintenance examination 05/21/2014  . MDD (major depressive disorder), single episode, moderate ([place]) 11/12/2013  . Dysuria 09/02/2013  . DDD (degenerative disc disease), lumbar 05/31/2013  . Medicare annual wellness visit, subsequent 05/20/2013  . HLD (hyperlipidemia) 05/20/2013  . Vitamin D deficiency 05/09/2013  . Recurrent UTI 01/03/2011  . Syncope 01/03/2011  . RASH-NONVESICULAR 05/11/2010  . GERD 02/22/2010  . CAD (coronary artery disease) 01/25/2010  . Osteoporosis 11/10/2009  . HYPOTENSION, ORTHOSTATIC 12/06/2008  . Anemia 09/25/2007  . B12 deficiency 05/03/2007  . Fibromyalgia 05/03/2007    Juliann Pulse SPT 01/07/2018, 2:04 PM  [place], [place], [address] Phone: [phone]   Fax:  [phone]  Name: LATANIA BASCOMB MRN: [id number] Date of Birth: 1945-12-06

## 2018-01-15 ENCOUNTER — Ambulatory Visit: Payer: Medicare Other | Admitting: Physical Therapy

## 2018-01-17 ENCOUNTER — Ambulatory Visit: Payer: Medicare Other | Admitting: Gastroenterology

## 2018-01-17 ENCOUNTER — Encounter: Payer: Self-pay | Admitting: Gastroenterology

## 2018-01-17 VITALS — BP 110/66 | HR 80 | Ht 65.0 in | Wt 163.0 lb

## 2018-01-17 DIAGNOSIS — K219 Gastro-esophageal reflux disease without esophagitis: Secondary | ICD-10-CM | POA: Diagnosis not present

## 2018-01-17 DIAGNOSIS — R05 Cough: Secondary | ICD-10-CM | POA: Diagnosis not present

## 2018-01-17 DIAGNOSIS — R1310 Dysphagia, unspecified: Secondary | ICD-10-CM | POA: Diagnosis not present

## 2018-01-17 DIAGNOSIS — R053 Chronic cough: Secondary | ICD-10-CM

## 2018-01-17 NOTE — Progress Notes (Signed)
    History of Present Illness: This is a 72 year old female with Parkinson's disease returning for problems with chronic dysphasia and a chronic cough.  She primarily notes difficulty swallowing meats.  No other foods reproducibly problems and by avoiding meats or taking in very small pieces she has done well.  She has an ongoing cough which is very bothersome that has not responded to antireflux treatments.  Modified barium swallow study in 08/2017 noted frequent coughing with swallowing however no oropharyngeal abnormalities were identified.  Barium esophagram in 08/2017 as below.  She was evaluated by Dr. Melvyn Novas for her chronic cough.  Barium esophagram 08/2017  1. Occasional secondary and tertiary contractions in the distal half of the esophagus, but primary peristaltic waves in the esophagus were essentially intact. No appreciable esophageal stricture or ulceration. In borderline slow but successful progress of the barium pill to the stomach.   Current Medications, Allergies, Past Medical History, Past Surgical History, Family History and Social History were reviewed in [employer] record.  Physical Exam: General: Well developed, well nourished, no acute distress Head: Normocephalic and atraumatic Eyes:  sclerae anicteric, EOMI Ears: Normal auditory acuity Mouth: No deformity or lesions Lungs: Clear throughout to auscultation Heart: Regular rate and rhythm; no murmurs, rubs or bruits Abdomen: Soft, non tender and non distended. No masses, hepatosplenomegaly or hernias noted. Normal Bowel sounds Musculoskeletal: Symmetrical with no gross deformities  Pulses:  Normal pulses noted Extremities: No clubbing, cyanosis, edema or deformities noted Neurological: Alert oriented x 4, resting tremor noted in both hands, R>L Psychological:  Alert and cooperative. Normal mood and affect  Assessment and Recommendations:  1.  Dysphasia likely primarily related to Parkinson's  disease.  Mild esophageal motility disturbances noted on barium esophagram.  An esophageal manometry study would have limited value and I doubt she would tolerate the catheter with her chronic cough.  She is not interested in proceeding with esophageal manometry.  Will complete the GI evaluation with an EGD. The risks (including bleeding, perforation, infection, missed lesions, medication reactions and possible hospitalization or surgery if complications occur), benefits, and alternatives to endoscopy with possible biopsy and possible dilation were discussed with the patient and they consent to proceed.   2.  GERD.  Continue pantoprazole 40 mg p.o. every morning and famotidine 20 mg p.o. at bedtime.  3. Chronic cough.  This does not appear to be GI related.  Follow-up with Dr. Melvyn Novas as planned.  4.  Parkinson's disease follow-up with Dr. Lucky Rathke as planned.

## 2018-01-17 NOTE — Patient Instructions (Signed)
You have been scheduled for an endoscopy. Please follow written instructions given to you at your visit today. If you use inhalers (even only as needed), please bring them with you on the day of your procedure. Your physician has requested that you go to www.startemmi.com and enter the access code given to you at your visit today. This web site gives a general overview about your procedure. However, you should still follow specific instructions given to you by our office regarding your preparation for the procedure.  Thank you for choosing me and [facility].  Malcolm T. Stark, Jr., MD., FACG  

## 2018-01-21 ENCOUNTER — Ambulatory Visit: Payer: Medicare Other | Admitting: Physical Therapy

## 2018-01-21 ENCOUNTER — Encounter: Payer: Self-pay | Admitting: Physical Therapy

## 2018-01-21 ENCOUNTER — Other Ambulatory Visit: Payer: Self-pay | Admitting: Family Medicine

## 2018-01-21 DIAGNOSIS — R262 Difficulty in walking, not elsewhere classified: Secondary | ICD-10-CM

## 2018-01-21 DIAGNOSIS — M6281 Muscle weakness (generalized): Secondary | ICD-10-CM

## 2018-01-21 DIAGNOSIS — R2689 Other abnormalities of gait and mobility: Secondary | ICD-10-CM

## 2018-01-21 DIAGNOSIS — R2681 Unsteadiness on feet: Secondary | ICD-10-CM

## 2018-01-21 DIAGNOSIS — R279 Unspecified lack of coordination: Secondary | ICD-10-CM

## 2018-01-21 NOTE — Therapy (Signed)
During this treatment session, the therapist was present, participating in and directing the treatment. [place], [place], [address] Phone: [phone]   Fax:  [phone]  Physical Therapy Treatment  Patient Details  Name: [name] MRN: [id number] Date of Birth: 02/12/1946 Referring Provider: [name]   Encounter Date: 01/21/2018  PT End of Session - 01/21/18 1355    Visit Number  6    Date for PT Re-Evaluation  02/01/18    PT Start Time  1310    PT Stop Time  1355    PT Time Calculation (min)  45 min    Activity Tolerance  Patient tolerated treatment well    Behavior During Therapy  [facility] for tasks assessed/performed       Past Medical History:  Diagnosis Date  . ANEMIA-NOS 09/25/2007  . Anxiety   . Arthritis   . B12 DEFICIENCY 05/03/2007  . CAD (coronary artery disease) 01/2010   MI, Nishan  . Cardiomyopathy ([place]) 02/08/2010   H/o this 2012 after urosepsis, no recurrence.   . Depression   . FIBROMYALGIA 05/03/2007  . GERD 02/22/2010  . Glaucoma 02/2013   [facility]  . History of CHF (congestive heart failure) 01/2010  . History of colon polyps 2004  . HYPERLIPIDEMIA 12/19/2007  . HYPOTENSION, ORTHOSTATIC 12/06/2008  . Interstitial cystitis    [place] now Dr Amalia Hailey  . Lupus (systemic lupus erythematosus) ([place]) 02/08/2010  . MCTD (mixed connective tissue disease) ([place]) 02/08/2010  . OSTEOPOROSIS 08/2009   bisphosphonate on hold 2/2 dysphagia, on reclast done in August each year  . Parkinson's disease ([place]) 08/25/2015   Dx Dr Carles Collet 07/2015   . 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 Takotsubo cardiomyopathy 2008   due to E coli urosepsis    Past Surgical History:  Procedure Laterality Date  . ABDOMINAL HYSTERECTOMY  1970s   IUD infection - first partial then with oophorectomy (cysts), complication - low blood pressure  . CATARACT EXTRACTION Right   .  CHOLECYSTECTOMY     complication - low blood pressure  . COLONOSCOPY  06/2008   h/o polyps but latest WNL, rec rpt 10 yrs Olevia Perches)  . CYSTOSCOPY  12/2013   abx treatment for recurrent cystitis  . DEXA  04/2013   T -2.9 @ femur, -1.6 @ spine  . DEXA  04/2017   T -2.9 hip, -0.7 spine    There were no vitals filed for this visit.  Subjective Assessment - 01/21/18 1310    Subjective  Pt. reports increasing use of bike at home to 20 mins 3x a day. Pt. reports having some lower back pain occasionally.     Currently in Pain?  Yes    Pain Score  3                       [facility] Adult PT Treatment/Exercise - 01/21/18 0001      Lumbar Exercises: Aerobic   Nustep  L4 x 6 min      Lumbar Exercises: Machines for Strengthening   Other Lumbar Machine Exercise  Rows 10# and lats 20# 2x10 needs cues to not shrug shoulders      Lumbar Exercises: Seated   Other Seated Lumbar Exercises  rows and shoulder extension with red tband 2x10      Knee/Hip Exercises: Machines for Strengthening   Cybex Knee Extension  5#  2x10     Cybex Knee Flexion  20# 2x12      Knee/Hip Exercises: Standing   Hip Flexion  Stengthening;Both;2 sets;10 reps;Limitations    Hip Flexion Limitations  2.5#    Hip Abduction  Stengthening;Both;2 sets;Left;10 reps    Abduction Limitations  2.5#    Hip Extension  Stengthening;Both;2 sets;10 reps;Limitations    Extension Limitations  2.5#    Walking with Sports Cord  20# 4 ways x3 each    Other Standing Knee Exercises  alt forward and lateral step ups 2x10 each      Knee/Hip Exercises: Seated   Ball Squeeze  20 reps    Sit to Sand  2 sets;10 reps;without UE support with wt ball into chest press               PT Short Term Goals - 12/18/17 1305      PT SHORT TERM GOAL #1   Title  independent iwth initial HEP    Status  Achieved        PT Long Term Goals - 01/07/18 1401      PT LONG TERM GOAL #1   Title  Pt will improve TUG to 13.5 seconds to  decrease fall risk    Baseline  15 seconds    Time  8    Period  Weeks    Status  On-going            Plan - 01/21/18 1355    Clinical Impression Statement  Pt. did well with exercises today. Pt. needs verbal and tactile cues during standing hip strengthening exercises to not side bend with ABD or forward flex with ext. Pt. needs postural cues with scapular stabilization exercises. Pt. did well with forward and lateral steps ups demonstrating good eccentric control. Pt. did wlel with resisted gait with sports cord demonstrating good eccentric control.     Rehab Potential  Good    PT Frequency  2x / week    PT Duration  8 weeks    PT Treatment/Interventions  ADLs/Self Care Home Management;Electrical Stimulation;Moist Heat;Gait training;Stair training;Functional mobility training;Therapeutic activities;Therapeutic exercise;Balance training;Neuromuscular re-education;Patient/family education    PT Next Visit Plan  progress balance exercises    Consulted and Agree with Plan of Care  Patient       Patient will benefit from skilled therapeutic intervention in order to improve the following deficits and impairments:  Abnormal gait, Decreased range of motion, Difficulty walking, Cardiopulmonary status limiting activity, Decreased endurance, Decreased balance, Decreased mobility, Decreased strength  Visit Diagnosis: Difficulty in walking, not elsewhere classified  Muscle weakness (generalized)  Other abnormalities of gait and mobility  Unsteady gait  Lack of coordination     Problem List Patient Active Problem List   Diagnosis Date Noted  . Dysphagia 10/09/2017  . Chronic cough 07/16/2017  . Right foot pain 04/28/2016  . Skin rash 04/28/2016  . GAD (generalized anxiety disorder) 04/28/2016  . Chronic insomnia 03/07/2016  . Left Achilles tendinitis 12/08/2015  . Parkinson's disease ([place]) 08/25/2015  . Advanced care planning/counseling discussion 05/26/2015  . Family  circumstance 02/23/2015  . Health maintenance examination 05/21/2014  . MDD (major depressive disorder), single episode, moderate ([place]) 11/12/2013  . Dysuria 09/02/2013  . DDD (degenerative disc disease), lumbar 05/31/2013  . Medicare annual wellness visit, subsequent 05/20/2013  . HLD (hyperlipidemia) 05/20/2013  . Vitamin D deficiency 05/09/2013  . Recurrent UTI 01/03/2011  . Syncope 01/03/2011  . RASH-NONVESICULAR 05/11/2010  . GERD 02/22/2010  .  CAD (coronary artery disease) 01/25/2010  . Osteoporosis 11/10/2009  . HYPOTENSION, ORTHOSTATIC 12/06/2008  . Anemia 09/25/2007  . B12 deficiency 05/03/2007  . Fibromyalgia 05/03/2007    Juliann Pulse SPT 01/21/2018, 1:59 PM  [place], [place], [address] Phone: [phone]   Fax:  [phone]  Name: [name] MRN: [id number] Date of Birth: [birth year]

## 2018-01-21 NOTE — Telephone Encounter (Signed)
Last filled:  12/16/17, #50 Last OV (CPE):  12/10/17 Next OV:  04/10/18

## 2018-01-22 ENCOUNTER — Telehealth: Payer: Self-pay | Admitting: Family Medicine

## 2018-01-22 NOTE — Telephone Encounter (Signed)
Wrong office

## 2018-01-22 NOTE — Telephone Encounter (Signed)
Spoke with pharmacist at [facility] giving him dx code M51.36 for DDD, lumbar per Dr. Henriette Combs understanding.

## 2018-01-22 NOTE — Telephone Encounter (Signed)
Eprescribed.

## 2018-01-22 NOTE — Telephone Encounter (Signed)
Copied from [place] [id number] [id number]. Topic: Inquiry >> Jan 22, 2018 10:56 AM Cecelia Byars, NT wrote: Reason for CRM: [facility] [address] called and needs diagnosis codes for traMADol (ULTRAM) 50 MG tablet ,[facility] to [id number] [name], [place] [phone] (Phone) [phone] (Fax)

## 2018-01-22 NOTE — Telephone Encounter (Signed)
Copied from [place] [id number]. Topic: Inquiry >> Jan 22, 2018 10:56 AM Cecelia Byars, NT wrote: Reason for CRM: [facility] [address] [facility] and needs diagnosis codes for traMADol (ULTRAM) 50 MG tablet ,sent to [id number] [name], [place] [phone] (Phone) [phone] (Fax)

## 2018-01-23 ENCOUNTER — Ambulatory Visit (AMBULATORY_SURGERY_CENTER): Payer: Medicare Other | Admitting: Gastroenterology

## 2018-01-23 ENCOUNTER — Encounter: Payer: Self-pay | Admitting: Gastroenterology

## 2018-01-23 ENCOUNTER — Other Ambulatory Visit: Payer: Self-pay

## 2018-01-23 VITALS — BP 122/69 | HR 78 | Temp 95.5°F | Resp 12 | Ht 65.0 in | Wt 163.0 lb

## 2018-01-23 DIAGNOSIS — R1312 Dysphagia, oropharyngeal phase: Secondary | ICD-10-CM

## 2018-01-23 DIAGNOSIS — R1310 Dysphagia, unspecified: Secondary | ICD-10-CM

## 2018-01-23 MED ORDER — SODIUM CHLORIDE 0.9 % IV SOLN: 500.0000 mL | Freq: Once | INTRAVENOUS | Status: DC

## 2018-01-23 NOTE — Op Note (Signed)
[facility] Patient Name: [name] Procedure Date: 01/23/2018 4:21 PM MRN: [id number] Endoscopist: Ladene Artist , MD Age: 72 Referring MD:  Date of Birth: March 25, 1946 Gender: Female Account #: [id number] Procedure:                Upper GI endoscopy Indications:              Dysphagia Medicines:                Monitored Anesthesia Care Procedure:                Pre-Anesthesia Assessment:                           - Prior to the procedure, a History and Physical                            was performed, and patient medications and                            allergies were reviewed. The patient's tolerance of                            previous anesthesia was also reviewed. The risks                            and benefits of the procedure and the sedation                            options and risks were discussed with the patient.                            All questions were answered, and informed consent                            was obtained. Prior Anticoagulants: The patient has                            taken no previous anticoagulant or antiplatelet                            agents. ASA Grade Assessment: III - A patient with                            severe systemic disease. After reviewing the risks                            and benefits, the patient was deemed in                            satisfactory condition to undergo the procedure.                           After obtaining informed consent, the endoscope was  passed under direct vision. Throughout the                            procedure, the patient's blood pressure, pulse, and                            oxygen saturations were monitored continuously. The                            Model GIF-[id number]) scope was introduced                            through the mouth, and advanced to the second part                            of duodenum. The upper GI endoscopy was                            accomplished without difficulty. The patient                            tolerated the procedure well. Scope In: Scope Out: Findings:                 No endoscopic abnormality was evident in the                            esophagus to explain the patient's complaint of                            dysphagia. It was decided, however, to proceed with                            dilation of the entire esophagus. A guidewire was                            placed and the scope was withdrawn. Dilation was                            performed with a Savary dilator with no resistance                            at 16 mm.                           A small hiatal hernia was present.                           The exam of the stomach was otherwise normal.                           The duodenal bulb and second portion of the                            duodenum were  normal. Complications:            No immediate complications. Estimated Blood Loss:     Estimated blood loss: none. Impression:               - No endoscopic esophageal abnormality to explain                            patient's dysphagia. Esophagus dilated. Dilated.                           - Small hiatal hernia.                           - Normal duodenal bulb and second portion of the                            duodenum.                           - No specimens collected. Recommendation:           - Clear liquid diet for 2 hours, then advance as                            tolerated to soft diet today. Resume prior diet                            tomorrow.                           - Follow antireflux measures.                           - Continue present medications.                           - Dysphagia due to Parkinsons disease and                            esophageal dysmotility.                           - Follow up with PCP and Neurology. Ladene Artist, MD 01/23/2018 4:40:01 PM This report has been signed  electronically.

## 2018-01-23 NOTE — Progress Notes (Signed)
Please refer to the blue and neon green sheets for instructions regarding diet and activity for the rest of today.Called to room to assist during endoscopic procedure.  Patient ID and intended procedure confirmed with present staff. Received instructions for my participation in the procedure from the performing physician. 

## 2018-01-23 NOTE — Progress Notes (Signed)
History reviewed today 

## 2018-01-23 NOTE — Patient Instructions (Signed)
GERD hand out given.  YOU HAD AN ENDOSCOPIC PROCEDURE TODAY AT [facility]:   Refer to the procedure report that was given to you for any specific questions about what was found during the examination.  If the procedure report does not answer your questions, please call your gastroenterologist to clarify.  If you requested that your care partner not be given the details of your procedure findings, then the procedure report has been included in a sealed envelope for you to review at your convenience later.  YOU SHOULD EXPECT: Some feelings of bloating in the abdomen. Passage of more gas than usual.  Walking can help get rid of the air that was put into your GI tract during the procedure and reduce the bloating. If you had a lower endoscopy (such as a colonoscopy or flexible sigmoidoscopy) you may notice spotting of blood in your stool or on the toilet paper. If you underwent a bowel prep for your procedure, you may not have a normal bowel movement for a few days.  Please Note:  You might notice some irritation and congestion in your nose or some drainage.  This is from the oxygen used during your procedure.  There is no need for concern and it should clear up in a day or so.  SYMPTOMS TO REPORT IMMEDIATELY:   Following lower endoscopy (colonoscopy or flexible sigmoidoscopy):  Excessive amounts of blood in the stool  Significant tenderness or worsening of abdominal pains  Swelling of the abdomen that is new, acute  Fever of 100F or higher   Following upper endoscopy (EGD)  Vomiting of blood or coffee ground material  New chest pain or pain under the shoulder blades  Painful or persistently difficult swallowing  New shortness of breath  Fever of 100F or higher  Black, tarry-looking stools  For urgent or emergent issues, a gastroenterologist can be reached at any hour by calling [phone].   DIET:  CLEAR LIQUIDS FOR 2 HOURS , UNTIL 6:30 PM. THEN ADVANCE TO SOFT DIET  TODAY. RESUME YOUR REGULAR DIET IN THE AM.  ACTIVITY:  You should plan to take it easy for the rest of today and you should NOT DRIVE or use heavy machinery until tomorrow (because of the sedation medicines used during the test).    FOLLOW UP: Our staff will call the number listed on your records the next business day following your procedure to check on you and address any questions or concerns that you may have regarding the information given to you following your procedure. If we do not reach you, we will leave a message.  However, if you are feeling well and you are not experiencing any problems, there is no need to return our call.  We will assume that you have returned to your regular daily activities without incident.  If any biopsies were taken you will be contacted by phone or by letter within the next 1-3 weeks.  Please call us at [phone] if you have not heard about the biopsies in 3 weeks.    SIGNATURES/CONFIDENTIALITY: You and/or your care partner have signed paperwork which will be entered into your electronic medical record.  These signatures attest to the fact that that the information above on your After Visit Summary has been reviewed and is understood.  Full responsibility of the confidentiality of this discharge information lies with you and/or your care-partner.

## 2018-01-23 NOTE — Progress Notes (Signed)
A/ox3 pleased with MAC, report to Sarah RN 

## 2018-01-24 ENCOUNTER — Ambulatory Visit (INDEPENDENT_AMBULATORY_CARE_PROVIDER_SITE_OTHER): Payer: Medicare Other

## 2018-01-24 ENCOUNTER — Telehealth: Payer: Self-pay | Admitting: *Deleted

## 2018-01-24 ENCOUNTER — Telehealth: Payer: Self-pay

## 2018-01-24 DIAGNOSIS — E538 Deficiency of other specified B group vitamins: Secondary | ICD-10-CM | POA: Diagnosis not present

## 2018-01-24 MED ORDER — CYANOCOBALAMIN 1000 MCG/ML IJ SOLN
1000.0000 ug | Freq: Once | INTRAMUSCULAR | Status: AC
  Administered 2018-01-24: 1000 ug via INTRAMUSCULAR

## 2018-01-24 NOTE — Telephone Encounter (Signed)
NO ANSWER, MESSAGE LEFT FOR PATIENT. 

## 2018-01-24 NOTE — Telephone Encounter (Signed)
Message left

## 2018-01-26 ENCOUNTER — Encounter: Payer: Self-pay | Admitting: Family Medicine

## 2018-01-27 NOTE — Progress Notes (Signed)
noted 

## 2018-01-28 ENCOUNTER — Encounter: Payer: Self-pay | Admitting: Physical Therapy

## 2018-01-28 ENCOUNTER — Ambulatory Visit: Payer: Medicare Other | Attending: Neurology | Admitting: Physical Therapy

## 2018-01-28 DIAGNOSIS — R262 Difficulty in walking, not elsewhere classified: Secondary | ICD-10-CM | POA: Diagnosis present

## 2018-01-28 DIAGNOSIS — R279 Unspecified lack of coordination: Secondary | ICD-10-CM | POA: Diagnosis present

## 2018-01-28 DIAGNOSIS — R2689 Other abnormalities of gait and mobility: Secondary | ICD-10-CM | POA: Insufficient documentation

## 2018-01-28 DIAGNOSIS — R2681 Unsteadiness on feet: Secondary | ICD-10-CM | POA: Insufficient documentation

## 2018-01-28 DIAGNOSIS — M6281 Muscle weakness (generalized): Secondary | ICD-10-CM

## 2018-01-28 NOTE — Therapy (Signed)
[place], [place], [address] Phone: [phone]   Fax:  [phone]  Physical Therapy Treatment  Patient Details  Name: [name] MRN: [id number] Date of Birth: 02-19-1946 Referring Provider: [name]   Encounter Date: 01/28/2018  PT End of Session - 01/28/18 1354    Visit Number  7    Date for PT Re-Evaluation  02/01/18    PT Start Time  8850    PT Stop Time  2774    PT Time Calculation (min)  47 min    Activity Tolerance  Patient tolerated treatment well    Behavior During Therapy  [facility] for tasks assessed/performed       Past Medical History:  Diagnosis Date  . ANEMIA-NOS 09/25/2007  . Anxiety   . Arthritis   . B12 DEFICIENCY 05/03/2007  . CAD (coronary artery disease) 01/2010   MI, Nishan  . Cardiomyopathy ([place]) 02/08/2010   H/o this 2012 after urosepsis, no recurrence.   . Depression   . FIBROMYALGIA 05/03/2007  . GERD 02/22/2010  . Glaucoma 02/2013    eye center  . History of CHF (congestive heart failure) 01/2010  . History of colon polyps 2004  . HYPERLIPIDEMIA 12/19/2007  . HYPOTENSION, ORTHOSTATIC 12/06/2008  . Interstitial cystitis    [place]tt[facility] now Dr Amalia Hailey  . Lupus (systemic lupus erythematosus) ([place]) 02/08/2010  . MCTD (mixed connective tissue disease) ([place]) 02/08/2010  . OSTEOPOROSIS 08/2009   bisphosphonate on hold 2/2 dysphagia, on reclast done in August each year  . Parkinson's disease ([place]) 08/25/2015   Dx Dr Carles Collet 07/2015   . 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 Takotsubo cardiomyopathy 2008   due to E coli urosepsis    Past Surgical History:  Procedure Laterality Date  . ABDOMINAL HYSTERECTOMY  1970s   IUD infection - first partial then with oophorectomy (cysts), complication - low blood pressure  . CATARACT EXTRACTION Right   . CHOLECYSTECTOMY     complication - low blood pressure  . COLONOSCOPY  06/2008   h/o polyps but latest WNL, rec rpt  10 yrs Olevia Perches)  . CYSTOSCOPY  12/2013   abx treatment for recurrent cystitis  . DEXA  04/2013   T -2.9 @ femur, -1.6 @ spine  . DEXA  04/2017   T -2.9 hip, -0.7 spine  . ESOPHAGOGASTRODUODENOSCOPY  12/2017   WNL, regardless esophagus dilated, small HH ([place])    There were no vitals filed for this visit.  Subjective Assessment - 01/28/18 1309    Subjective  Patient reports that she is "achey" all over.  She reports that she tries to walk some but needs to do more everyday.    Currently in Pain?  Yes    Pain Score  3     Pain Location  Back    Pain Orientation  Lower    Aggravating Factors   walking, being active                      [facility] Adult PT Treatment/Exercise - 01/28/18 0001      Ambulation/Gait   Gait Comments  gait outside around the building, slow pace but no rest, then around the front parking area      High Level Balance   High Level Balance Activities  Negotiating over obstacles    High Level Balance Comments  on airex ball tosses  Lumbar Exercises: Aerobic   UBE (Upper Arm Bike)  level 4 x 4 minutes    Nustep  L4 x 6 min      Lumbar Exercises: Standing   Other Standing Lumbar Exercises  PWR moves in standing, boxing simulation in standing    Other Standing Lumbar Exercises  PWR moves in sitting               PT Short Term Goals - 12/18/17 1305      PT SHORT TERM GOAL #1   Title  independent iwth initial HEP    Status  Achieved        PT Long Term Goals - 01/28/18 1356      PT LONG TERM GOAL #1   Title  Pt will improve TUG to 13.5 seconds to decrease fall risk    Status  Achieved      PT LONG TERM GOAL #2   Title  Pt will improve Berg balance score to 49 or greater to decrease fall risk    Status  Partially Met      PT LONG TERM GOAL #3   Title  Pt will be independent for home exercises    Status  Partially Met      PT LONG TERM GOAL #4   Title  pt will improve R quad strength to 4/5 or greater    Status   Partially Met            Plan - 01/28/18 1355    Clinical Impression Statement  Patient did very well with all activities today, only one loss of balance and this was with the standing and stepping PWR moves.  She did great on the airex.  I gave these to do at home in sitting and standing.      PT Next Visit Plan  due to her high copay she is going to wait to see her neurologist, we will place on hold       Patient will benefit from skilled therapeutic intervention in order to improve the following deficits and impairments:  Abnormal gait, Decreased range of motion, Difficulty walking, Cardiopulmonary status limiting activity, Decreased endurance, Decreased balance, Decreased mobility, Decreased strength  Visit Diagnosis: Difficulty in walking, not elsewhere classified  Muscle weakness (generalized)  Other abnormalities of gait and mobility  Unsteady gait  Lack of coordination     Problem List Patient Active Problem List   Diagnosis Date Noted  . Dysphagia 10/09/2017  . Chronic cough 07/16/2017  . Right foot pain 04/28/2016  . Skin rash 04/28/2016  . GAD (generalized anxiety disorder) 04/28/2016  . Chronic insomnia 03/07/2016  . Left Achilles tendinitis 12/08/2015  . Parkinson's disease ([place]) 08/25/2015  . Advanced care planning/counseling discussion 05/26/2015  . Family circumstance 02/23/2015  . Health maintenance examination 05/21/2014  . MDD (major depressive disorder), single episode, moderate ([place]) 11/12/2013  . Dysuria 09/02/2013  . DDD (degenerative disc disease), lumbar 05/31/2013  . Medicare annual wellness visit, subsequent 05/20/2013  . HLD (hyperlipidemia) 05/20/2013  . Vitamin D deficiency 05/09/2013  . Recurrent UTI 01/03/2011  . Syncope 01/03/2011  . RASH-NONVESICULAR 05/11/2010  . GERD 02/22/2010  . CAD (coronary artery disease) 01/25/2010  . Osteoporosis 11/10/2009  . HYPOTENSION, ORTHOSTATIC 12/06/2008  . Anemia 09/25/2007  . B12  deficiency 05/03/2007  . Fibromyalgia 05/03/2007    Sumner Boast., PT 01/28/2018, 1:57 PM  [place], [place], [address]  Phone: [phone]   Fax:  [phone]  Name: [name] MRN: [id number] Date of Birth: July 17, 1946

## 2018-02-04 ENCOUNTER — Other Ambulatory Visit: Payer: Self-pay | Admitting: Family Medicine

## 2018-02-04 NOTE — Telephone Encounter (Signed)
Last filled:  11/07/18, #90 Last OV (CPE):  12/10/17 Next OV:  04/10/18

## 2018-02-12 ENCOUNTER — Other Ambulatory Visit: Payer: Self-pay | Admitting: Neurology
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
## 2018-02-13 ENCOUNTER — Other Ambulatory Visit: Payer: Self-pay | Admitting: Neurology

## 2018-02-14 ENCOUNTER — Encounter: Payer: Self-pay | Admitting: Neurology

## 2018-02-14 ENCOUNTER — Ambulatory Visit: Payer: Medicare Other | Admitting: Neurology

## 2018-02-14 VITALS — BP 110/68 | HR 70 | Ht 65.0 in | Wt 161.0 lb

## 2018-02-14 DIAGNOSIS — G20 Parkinson's disease: Secondary | ICD-10-CM

## 2018-02-14 DIAGNOSIS — G903 Multi-system degeneration of the autonomic nervous system: Secondary | ICD-10-CM | POA: Diagnosis not present

## 2018-02-14 DIAGNOSIS — K5901 Slow transit constipation: Secondary | ICD-10-CM

## 2018-02-14 DIAGNOSIS — G249 Dystonia, unspecified: Secondary | ICD-10-CM

## 2018-02-14 MED ORDER — CARBIDOPA-LEVODOPA 25-100 MG PO TABS: 1.5000 | ORAL_TABLET | Freq: Three times a day (TID) | ORAL | 1 refills | Status: DC

## 2018-02-14 MED ORDER — PRAMIPEXOLE DIHYDROCHLORIDE 1 MG PO TABS: 1.0000 mg | ORAL_TABLET | Freq: Three times a day (TID) | ORAL | 1 refills | Status: DC

## 2018-02-14 NOTE — Patient Instructions (Addendum)
Constipation and Parkinson's disease:  1.Rancho recipe for constipation in Parkinsons Disease:  -1 cup of unprocessed bran (need to get this at [employer], [employer] or similar type of store), 2 cups of applesauce in 1 cup of prune juice 2.  Increase fiber intake (Metamucil,vegetables) 3.  Regular, moderate exercise can be beneficial. 4.  Avoid medications causing constipation, such as medications like antacids with calcium or magnesium 5.  Laxative overuse should be avoided. 6.  Stool softeners (Colace) can help with chronic constipation. 7.  Increase water intake.  You should be drinking 1/2 gallon of water a day as long as you have not been diagnosed with congestive heart failure or renal/kidney failure.  This is probably the single greatest thing that you can do to help your constipation.     Powering Together for [employer] & Movement Disorders  The [place] [facility]'s and Movement Disorders team know that living well with a movement disorder extends far beyond our clinic walls. We are together with you. Our team is passionate about providing resources to you and your loved ones who are living with Parkinson's disease and movement disorders. Participate in these programs and join our community. These resources are free or low cost!    Parkinson's and Movement Disorders Program is adding:   Innovative educational programs for patients and caregivers.   Support groups for patients and caregivers living with Parkinson's disease.   Parkinson's specific exercise programs.   Custom tailored therapeutic programs that will benefit patient's living with Parkinson's disease.   We are in this together. You can help and contribute to grow these programs and resources in our community. 100% of the funds donated to the [place] stays right here in our community to support patients and their caregivers.  To make a tax deductible contribution:  -ask for a Power  Together for Parkinson's envelope in the office today.  - call the Office of Institutional Advancement at [phone].       Community Parkinson's Exercise Programs   Parkinson's Wellness Recovery Exercise Programs:   PWR! Moves PD Exercise Class:  This is a therapist-led exercise class for people with Parkinson's disease in the [place] community. It consists of a one-hour exercise class each week. Classes are offered in eight-week sessions, and the cost per session is $80. Class size is limited to a maximum of 20 participants. Participant criteria includes: Participant must be able to get up and down from the floor with minimal to no assistance, have had 0-1 falls in the past 6 months, and have completed physical or occupational therapy at [facility] within the past year.  To find out more about session dates, questions, or to register, please contact Mady Haagensen, Physical Therapist, or Nita Sells, Physical Brewing technologist, at [facility] at [phone].  PWR! Circuit Class:  This is a therapist-led exercise class with intervals of circuit activities incorporating PWR! Moves into functional activities. It consists of one 45-minute exercise class per week. Classes are offered in eight-week sessions, and the cost per session is $120. Class size is limited to a maximum of eight participants to allow for hands-on instruction. Participant criteria: class is ideal for people with Parkinson's disease who have completed PWR! Moves Exercise Class or who are currently independently exercising and want to be challenged, must be able to walk independently with 0-1 falls in the past 6 months, able to get up and down from the floor independently, able to sit to  stand independently, and able to jog 20 feet.   To find out more about session dates, questions, or to register, please contact Mady Haagensen, Physical Therapist, or Nita Sells, Physical Brewing technologist, at [facility] at [phone].   [employer] Parkinson's Cycle:   Parkinson's Cycle Class at [facility] This is an ongoing class on Monday and Thursday mornings at 10:45 a.m. A healthcare provider referral is required to enroll. This class is FREE to participants, and you do not have to be a member of the [employer] to enroll. Contact Beth at [phone] or [email or web address]. Parkinson's Cycle Class at [facility] Ongoing Class Monday, Wednesday, and Friday mornings at 9:00 a.m. A healthcare provider referral is required to enroll. This class is FREE to participants, and you do not have to be a member of the [employer] to enroll. Contact Marlee at [phone] or [email or web address]. Parkinson's Cycle Class at [facility] Ongoing Class every Friday mornings at 12 p.m.  A healthcare provider referral is required to enroll. This class is FREE to participants, and you do not have to be a member of the [employer] to enroll. Contact [phone].  Parkinson's Cycle Class at [facility] Ongoing Class every Monday at 12pm.  A healthcare provider referral is required to enroll. This class is FREE to participants, and you do not have to be a member of the [employer] to enroll. Contact Almyra Free at [phone] or  [email or web address].   [employer]ing:  [employer]  Classes are offered Mondays at 5:15 p.m. and Tuesdays and Thursdays at 12 p.m. at [employer]. For more information, contact [phone] or visit [email or web address] or [email or web address]. [employer] [place] Classes are offered Monday, Wednesday, and Friday from 9:30 a.m. - 11:00 a.m. For more information, contact [phone] or [phone] or email [email or web address] or visit [email or web address] or [email or web address]. [employer] (classes are offered at 2 locations) . [facility] [employer] in [place] (for more information, contact [place] at [phone] or email [email or web address] . Cathren Laine at [facility] (class is open to the public -- for more information, contact Clabe Seal at [phone] or email [email or web address]) [place] are held at [facility] in [place], [place]. For more information, call Dr. Bing Plume at [phone] or [email or web address].   Personal Training for Parkinson's:   ACT Offers certified personal training to customize a program to meet your exercise needs to address Parkinson's disease. For more information, contact [phone] or visit [email or web address].  Community Dance for Parkinson's:   Community dance class for people with Parkinson's Disease Wednesdays at 9 a.m. [employer] [place], [place] [phone] Please contact Eliberto Ivory [phone] for more information  Scholarships Available for Fitness Programs:  The [place] for [employer] is a non-profit 501(C)3 organization run by volunteers, whose mission is to strive to empower those living with Parkinson's Disease (PD), Progressive Supra-Nuclear Palsy (PSP) and Multiple System Atrophy ([place]).  Through financial support, recipients benefit from individual and group programs. [phone] [email or web address]

## 2018-02-21 ENCOUNTER — Ambulatory Visit: Payer: Medicare Other

## 2018-02-21 ENCOUNTER — Encounter: Payer: Self-pay | Admitting: Neurology

## 2018-02-27 ENCOUNTER — Ambulatory Visit (INDEPENDENT_AMBULATORY_CARE_PROVIDER_SITE_OTHER): Payer: Medicare Other

## 2018-02-27 DIAGNOSIS — E538 Deficiency of other specified B group vitamins: Secondary | ICD-10-CM | POA: Diagnosis not present

## 2018-02-27 MED ORDER — CYANOCOBALAMIN 1000 MCG/ML IJ SOLN
1000.0000 ug | Freq: Once | INTRAMUSCULAR | Status: AC
  Administered 2018-02-27: 1000 ug via INTRAMUSCULAR

## 2018-03-04 ENCOUNTER — Telehealth: Payer: Self-pay | Admitting: Neurology

## 2018-03-04 NOTE — Telephone Encounter (Signed)
Mychart message sent to patient.

## 2018-03-08 ENCOUNTER — Other Ambulatory Visit: Payer: Self-pay | Admitting: Neurology

## 2018-03-15 ENCOUNTER — Other Ambulatory Visit: Payer: Self-pay | Admitting: Internal Medicine

## 2018-03-19 ENCOUNTER — Other Ambulatory Visit: Payer: Self-pay | Admitting: Internal Medicine

## 2018-03-23 ENCOUNTER — Other Ambulatory Visit: Payer: Self-pay | Admitting: Internal Medicine

## 2018-03-23 ENCOUNTER — Other Ambulatory Visit: Payer: Self-pay | Admitting: Family Medicine

## 2018-03-25 NOTE — Telephone Encounter (Signed)
Last filled:  01/22/18, #50 Last OV (CPE):  12/10/17 Next OV:  04/10/18

## 2018-03-26 NOTE — Telephone Encounter (Signed)
Eprescribed.

## 2018-03-28 ENCOUNTER — Encounter: Payer: Self-pay | Admitting: Family Medicine

## 2018-03-28 ENCOUNTER — Other Ambulatory Visit: Payer: Self-pay | Admitting: Internal Medicine

## 2018-03-29 ENCOUNTER — Other Ambulatory Visit: Payer: Self-pay

## 2018-03-29 NOTE — Telephone Encounter (Signed)
Dr. Damita Dunnings, will you address this refill in Dr. Synthia Innocent absence?  Last rx:  02/05/18, #90 Last OV (CPE):  12/10/17 Next OV:  04/10/18

## 2018-03-31 MED ORDER — GABAPENTIN 100 MG PO CAPS: 100.0000 mg | ORAL_CAPSULE | Freq: Every day | ORAL | 0 refills | Status: DC

## 2018-03-31 NOTE — Telephone Encounter (Signed)
Sent. Thanks.   

## 2018-04-04 ENCOUNTER — Telehealth: Payer: Self-pay | Admitting: *Deleted

## 2018-04-04 NOTE — Telephone Encounter (Signed)
Information has been submitted to pts insurance for verification of benefits. Awaiting response for coverage  

## 2018-04-10 ENCOUNTER — Encounter: Payer: Self-pay | Admitting: Family Medicine

## 2018-04-10 ENCOUNTER — Ambulatory Visit (INDEPENDENT_AMBULATORY_CARE_PROVIDER_SITE_OTHER)
Admission: RE | Admit: 2018-04-10 | Discharge: 2018-04-10 | Disposition: A | Discharge status: Home / Self Care | Payer: Medicare Other | Source: Ambulatory Visit | Attending: Family Medicine | Admitting: Family Medicine

## 2018-04-10 ENCOUNTER — Ambulatory Visit: Payer: Medicare Other | Admitting: Family Medicine

## 2018-04-10 VITALS — BP 120/70 | HR 70 | Temp 97.8°F | Ht 66.0 in | Wt 163.5 lb

## 2018-04-10 DIAGNOSIS — G20 Parkinson's disease: Secondary | ICD-10-CM | POA: Diagnosis not present

## 2018-04-10 DIAGNOSIS — M5416 Radiculopathy, lumbar region: Secondary | ICD-10-CM

## 2018-04-10 DIAGNOSIS — E538 Deficiency of other specified B group vitamins: Secondary | ICD-10-CM

## 2018-04-10 DIAGNOSIS — M810 Age-related osteoporosis without current pathological fracture: Secondary | ICD-10-CM

## 2018-04-10 DIAGNOSIS — M797 Fibromyalgia: Secondary | ICD-10-CM

## 2018-04-10 DIAGNOSIS — K219 Gastro-esophageal reflux disease without esophagitis: Secondary | ICD-10-CM | POA: Diagnosis not present

## 2018-04-10 DIAGNOSIS — M5136 Other intervertebral disc degeneration, lumbar region: Secondary | ICD-10-CM | POA: Diagnosis not present

## 2018-04-10 HISTORY — DX: Radiculopathy, lumbar region: M54.16

## 2018-04-10 LAB — VITAMIN B12: VITAMIN B 12: 347 pg/mL (ref 211–911)

## 2018-04-10 MED ORDER — CYANOCOBALAMIN 1000 MCG/ML IJ SOLN
1000.0000 ug | Freq: Once | INTRAMUSCULAR | Status: AC
  Administered 2018-04-10: 1000 ug via INTRAMUSCULAR

## 2018-04-10 NOTE — Progress Notes (Signed)
BP 120/70 (BP Location: Left Arm, Patient Position: Sitting, Cuff Size: Normal)   Pulse 70   Temp 97.8 F (36.6 C) (Oral)   Ht 5\' 6"  (1.676 m)   Wt 163 lb 8 oz (74.2 kg)   SpO2 96%   BMI 26.39 kg/m    CC: 4 mo f/u visit Subjective:    Patient ID: [name], female    DOB: 1946/05/04, 72 y.o.   MRN: [id number]  HPI: [name] is a 72 y.o. female presenting on 04/10/2018 for 4 mo follow-up (Thinks it is time for vit B12 blood work. Wants to discuss vit B12 oral therapy.) and Back Pain (C/o right low back pain when sitting for long period of time. )   Since last seen here, seen by GI - dysphagia thought PD related (although neuro did not think this was the case). Mild esophageal motility disturbance on barium esophagram. Declined esoph manometry. EGD showed . For GERD, continues PPI daily, famotidine 20mg  at night - states antihistamine was stopped. For chronic cough - planned pulm eval ([facility]). Not PD related.   PD - on northera 600mg  tid and compression binder for orthostasis, carbidopa/levodopa, pramipexole. Very happy with support group.   Osteoporosis - prolia started 03/2017.   Vit b12 def - monthly b12 shots. Last shot 02/27/2018.   Worsening R lateral hip pain worse with prolonged sitting or standing, better supine. Ongoing for months, maybe since 06/2017. Riding stationary bike causes worse pain. Denies inciting trauma/injury or fall.ongoing R radiculopathy down lateral thigh to knee. Seems to catch just above buttock. Some R groin pain. Denies numbness/weakness of legs, saddle anesthesia, or bowel/bladder accidents. Treated for sciatica with prednisone course - may have temporarily helped. She does go to PD physical therapy - just completed. She does take neurontin 100mg  bid.  Relevant past medical, surgical, family and social history reviewed and updated as indicated. Interim medical history since our last visit reviewed. Allergies and medications reviewed and  updated. Outpatient Medications Prior to Visit  Medication Sig Dispense Refill  . alfuzosin (UROXATRAL) 10 MG 24 hr tablet Take 1 tablet by mouth daily.    . Calcium Carbonate-Vitamin D (CALCIUM 600/VITAMIN D) 600-400 MG-UNIT chew tablet Chew 1 tablet by mouth daily.    . carbidopa-levodopa (SINEMET CR) 50-200 MG tablet TAKE 1 TABLET BY MOUTH AT BEDTIME. 90 tablet 1  . carbidopa-levodopa (SINEMET IR) 25-100 MG tablet Take 1.5 tablets by mouth 3 (three) times daily. 405 tablet 1  . clonazePAM (KLONOPIN) 0.5 MG tablet Take 1 tablet (0.5 mg total) by mouth 2 (two) times daily as needed. 60 tablet 1  . cyanocobalamin (,VITAMIN B-12,) 1000 MCG/ML injection Inject 1 mL (1,000 mcg total) into the muscle every 30 (thirty) days.    Marland Kitchen gabapentin (NEURONTIN) 100 MG capsule Take 1 capsule (100 mg total) by mouth at bedtime. 90 capsule 0  . NORTHERA 300 MG CAPS TAKE 2 CAPSULES BY MOUTH THREE TIMES DAILY 540 capsule 1  . pantoprazole (PROTONIX) 40 MG tablet TAKE 1 TABLET (40 MG TOTAL) BY MOUTH DAILY. TAKE 30-60 MIN BEFORE FIRST MEAL OF THE DAY 30 tablet 2  . polyethylene glycol (GLYCOLAX) packet Take 17 g by mouth as needed.      . pramipexole (MIRAPEX) 1 MG tablet Take 1 tablet (1 mg total) by mouth 3 (three) times daily. 270 tablet 1  . sertraline (ZOLOFT) 100 MG tablet TAKE 1 TABLET (100 MG TOTAL) BY MOUTH DAILY. 90 tablet 1  . timolol (  BETIMOL) 0.5 % ophthalmic solution 1 drop 2 (two) times daily.    . traMADol (ULTRAM) 50 MG tablet TAKE 1 TABLET BY MOUTH THREE TIMES A DAY AS NEEDED (M51.36 DEG DISK) 50 tablet 0  . triamcinolone cream (KENALOG) 0.5 % Apply to affected area twice daily for no more than 2 weeks at a time (Patient taking differently: as needed. Apply to affected area twice daily for no more than 2 weeks at a time) 30 g 1  . famotidine (PEPCID) 20 MG tablet TAKE 1 TABLET BY MOUTH EVERYDAY AT BEDTIME 30 tablet 2  . MYRBETRIQ 50 MG TB24 tablet Take 50 mg by mouth daily.  11    Facility-Administered Medications Prior to Visit  Medication Dose Route Frequency Provider Last Rate Last Dose  . 0.9 %  sodium chloride infusion  500 mL Intravenous Once Ladene Artist, MD         Per HPI unless specifically indicated in ROS section below Review of Systems     Objective:    BP 120/70 (BP Location: Left Arm, Patient Position: Sitting, Cuff Size: Normal)   Pulse 70   Temp 97.8 F (36.6 C) (Oral)   Ht 5\' 6"  (1.676 m)   Wt 163 lb 8 oz (74.2 kg)   SpO2 96%   BMI 26.39 kg/m   Wt Readings from Last 3 Encounters:  04/10/18 163 lb 8 oz (74.2 kg)  02/14/18 161 lb (73 kg)  01/23/18 163 lb (73.9 kg)    Physical Exam  Constitutional: She appears well-developed and well-nourished. No distress.  Musculoskeletal: She exhibits no edema.  Tender midline lumbar spine Mild paraspinous mm tenderness - attributed to FM + SLR on right No pain with int/ext rotation at hip. + FABER on right. + pain at R>L SIJ, R sciatic notch. No pain at [place] bilaterally.   Neurological: She is alert.  Reflex Scores:      Patellar reflexes are 1+ on the right side and 1+ on the left side. 4+/5 strength BLE  Psychiatric: She has a normal mood and affect.  Nursing note and vitals reviewed.  Results for orders placed or performed in visit on 11/13/17  Calcium  Result Value Ref Range   Calcium 9.5 8.4 - 10.5 mg/dL   Lab Results  Component Value Date   VITAMINB12 1,025 (H) 03/15/2017       Assessment & Plan:   Problem List Items Addressed This Visit    B12 deficiency    Compliant with monthly B12 shots. Update B12 level - if elevated, will transition to oral replacement.       Relevant Orders   Vitamin B12   DDD (degenerative disc disease), lumbar    Ongoing - check lumbar films. See below.       Fibromyalgia   GERD    Pt states now off pepcid. Will continue PPI daily.       Osteoporosis    Continue prolia - due next month.       Parkinson's disease ([place])   Right  lumbar radiculopathy - Primary    Ongoing over 8 months now. Temporary improvement with prednisone course 06/2017. Exam today consistent with R sciatica, some pain with SLR test suggesting possible HNP, she also has pain at R SIJ. Will update lumbar films, consider PT referral. Pt agrees with plan.       Relevant Orders   DG Lumbar Spine Complete       No orders of the defined types were  placed in this encounter.  Orders Placed This Encounter  Procedures  . DG Lumbar Spine Complete    Standing Status:   Future    Number of Occurrences:   1    Standing Expiration Date:   06/11/2019    Order Specific Question:   Reason for Exam (SYMPTOM  OR DIAGNOSIS REQUIRED)    Answer:   chronic lower back pain with R radiculopathy    Order Specific Question:   Preferred imaging location?    Answer:   [facility]    Order Specific Question:   Radiology Contrast Protocol - do NOT remove file path    Answer:   [email or web address]  . Vitamin B12    Follow up plan: Return in about 4 months (around 08/11/2018) for follow up visit.  [name], MD

## 2018-04-10 NOTE — Assessment & Plan Note (Signed)
Ongoing - check lumbar films. See below.

## 2018-04-10 NOTE — Assessment & Plan Note (Signed)
Ongoing over 8 months now. Temporary improvement with prednisone course 06/2017. Exam today consistent with R sciatica, some pain with SLR test suggesting possible HNP, she also has pain at R SIJ. Will update lumbar films, consider PT referral. Pt agrees with plan.

## 2018-04-10 NOTE — Assessment & Plan Note (Signed)
Compliant with monthly B12 shots. Update B12 level - if elevated, will transition to oral replacement.

## 2018-04-10 NOTE — Patient Instructions (Addendum)
B12 blood work then shot today.  Lumbar xrays today. We will call you with results. We may refer you to PT for lower back.  Return in 4 months for follow up visit.

## 2018-04-10 NOTE — Assessment & Plan Note (Signed)
Pt states now off pepcid. Will continue PPI daily.

## 2018-04-10 NOTE — Assessment & Plan Note (Signed)
Continue prolia - due next month.

## 2018-04-10 NOTE — Addendum Note (Signed)
Addended by: Brenton Grills on: 7/94/4461 90:12 AM   Modules accepted: Orders

## 2018-04-14 ENCOUNTER — Other Ambulatory Visit: Payer: Self-pay | Admitting: Family Medicine

## 2018-04-14 ENCOUNTER — Encounter: Payer: Self-pay | Admitting: Family Medicine

## 2018-04-14 DIAGNOSIS — M5136 Other intervertebral disc degeneration, lumbar region: Secondary | ICD-10-CM

## 2018-04-14 DIAGNOSIS — M5416 Radiculopathy, lumbar region: Secondary | ICD-10-CM

## 2018-04-16 ENCOUNTER — Telehealth: Payer: Self-pay

## 2018-04-16 NOTE — Telephone Encounter (Signed)
Left message for patient to call Tydarius Yawn back in regards to a referral-Eleazar Kimmey V Alencia Gordon, RMA   

## 2018-04-17 ENCOUNTER — Other Ambulatory Visit: Payer: Self-pay | Admitting: Family Medicine

## 2018-04-17 NOTE — Telephone Encounter (Signed)
Last filled:  01/21/18, #60 Last OV:  04/10/18 Next OV:  08/13/18

## 2018-04-18 ENCOUNTER — Other Ambulatory Visit: Payer: Self-pay | Admitting: Internal Medicine

## 2018-04-18 NOTE — Telephone Encounter (Signed)
Eprescribed.

## 2018-04-19 MED ORDER — POLYETHYLENE GLYCOL 3350 17 G PO PACK: 17.0000 g | PACK | ORAL | 1 refills | Status: DC | PRN

## 2018-04-29 ENCOUNTER — Ambulatory Visit: Payer: Medicare Other | Attending: Neurology | Admitting: Physical Therapy

## 2018-04-29 ENCOUNTER — Encounter: Payer: Self-pay | Admitting: Physical Therapy

## 2018-04-29 DIAGNOSIS — R2689 Other abnormalities of gait and mobility: Secondary | ICD-10-CM | POA: Diagnosis present

## 2018-04-29 DIAGNOSIS — M62830 Muscle spasm of back: Secondary | ICD-10-CM

## 2018-04-29 DIAGNOSIS — R2681 Unsteadiness on feet: Secondary | ICD-10-CM | POA: Insufficient documentation

## 2018-04-29 DIAGNOSIS — M5441 Lumbago with sciatica, right side: Secondary | ICD-10-CM | POA: Insufficient documentation

## 2018-04-29 DIAGNOSIS — M6281 Muscle weakness (generalized): Secondary | ICD-10-CM | POA: Diagnosis present

## 2018-04-29 DIAGNOSIS — R262 Difficulty in walking, not elsewhere classified: Secondary | ICD-10-CM | POA: Diagnosis present

## 2018-04-29 NOTE — Therapy (Signed)
[place], [place], [address] Phone: [phone]   Fax:  [phone]  Physical Therapy Evaluation  Patient Details  Name: [name] MRN: [id number] Date of Birth: 1946/05/20 Referring Provider: Ishmael Holter   Encounter Date: 04/29/2018  PT End of Session - 04/29/18 1000    Visit Number  1    Date for PT Re-Evaluation  06/29/18    PT Start Time  0928    PT Stop Time  1016    PT Time Calculation (min)  48 min    Activity Tolerance  Patient limited by pain    Behavior During Therapy  [facility] for tasks assessed/performed       Past Medical History:  Diagnosis Date  . ANEMIA-NOS 09/25/2007  . Anxiety   . Arthritis   . B12 DEFICIENCY 05/03/2007  . CAD (coronary artery disease) 01/2010   MI, Nishan  . Cardiomyopathy ([place]) 02/08/2010   H/o this 2012 after urosepsis, no recurrence.   . Depression   . FIBROMYALGIA 05/03/2007  . GERD 02/22/2010  . Glaucoma 02/2013   [facility]  . History of CHF (congestive heart failure) 01/2010  . History of colon polyps 2004  . HYPERLIPIDEMIA 12/19/2007  . HYPOTENSION, ORTHOSTATIC 12/06/2008  . Interstitial cystitis    Ottelin now Dr Amalia Hailey  . Lupus (systemic lupus erythematosus) ([place]) 02/08/2010  . MCTD (mixed connective tissue disease) ([place]) 02/08/2010  . OSTEOPOROSIS 08/2009   bisphosphonate on hold 2/2 dysphagia, on reclast done in August each year  . Parkinson's disease ([place]) 08/25/2015   Dx Dr Carles Collet 07/2015   . 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 Takotsubo cardiomyopathy 2008   due to E coli urosepsis    Past Surgical History:  Procedure Laterality Date  . ABDOMINAL HYSTERECTOMY  1970s   IUD infection - first partial then with oophorectomy (cysts), complication - low blood pressure  . CATARACT EXTRACTION Right   . CHOLECYSTECTOMY     complication - low blood pressure  . COLONOSCOPY  06/2008   h/o polyps but latest WNL,  rec rpt 10 yrs Olevia Perches)  . CYSTOSCOPY  12/2013   abx treatment for recurrent cystitis  . DEXA  04/2013   T -2.9 @ femur, -1.6 @ spine  . DEXA  04/2017   T -2.9 hip, -0.7 spine  . ESOPHAGOGASTRODUODENOSCOPY  12/2017   WNL, regardless esophagus dilated, small HH ([facility])    There were no vitals filed for this visit.   Subjective Assessment - 04/29/18 0936    Subjective  Pateint reports that she has had some LBP for a few years, she reports that in April she got "a lot worse".  She is unsure of a cause.  She has DDD, reports some pain going down the right leg to the knee area    Limitations  Lifting;Standing;Walking;House hold activities    Patient Stated Goals  have less pain    Currently in Pain?  Yes    Pain Score  7     Pain Location  Back    Pain Orientation  Right;Lower    Pain Descriptors / Indicators  Aching;Tightness;Spasm    Pain Type  Acute pain    Pain Radiating Towards  into the right buttock and the right posterior leg    Pain Onset  More than a month ago    Pain Frequency  Constant    Aggravating Factors  sitting, walking, standing  pain up to 10/10    Pain Relieving Factors  she has not found anything that has helped    Effect of Pain on Daily Activities  limits sitting, walking, shopping         [facility] PT Assessment - 04/29/18 0001      Assessment   Medical Diagnosis  LBP    Referring Provider  Ishmael Holter    Onset Date/Surgical Date  03/29/18    Prior Therapy  parkinsons and balance       Precautions   Precautions  None      Balance Screen   Has the patient fallen in the past 6 months  No    Has the patient had a decrease in activity level because of a fear of falling?   No    Is the patient reluctant to leave their home because of a fear of falling?   No      Home Environment   Additional Comments  lives with daughter and two grandchildren      Prior Function   Level of Independence  Independent    Vocation  Retired    Leisure  has tried some  exercise      Posture/Postural Control   Posture Comments  decreased lordosis, fwd head, rounded shoulders      ROM / Strength   AROM / PROM / Strength  AROM;Strength      AROM   Overall AROM Comments  Lumbar ROM was decreased 75% for flexion and decreased 100% for other exercises      Strength   Overall Strength Comments  strength of the left LE -s 4/5, right LE 3+/5 with pain in the back      Flexibility   Soft Tissue Assessment /Muscle Length  yes    Hamstrings  + SLR at 60 degrees, pain in the back and the buttock    Piriformis  very tight      Ambulation/Gait   Gait Comments  no device, very slow and gaurded, slightly stooped posture, antalgic on the right      Timed Up and Go Test   Normal TUG (seconds)  26 due to pain                Objective measurements completed on examination: See above findings.      [facility] Adult PT Treatment/Exercise - 04/29/18 0001      Modalities   Modalities  Electrical Stimulation;Moist Heat      Moist Heat Therapy   Number Minutes Moist Heat  15 Minutes    Moist Heat Location  Lumbar Spine      Electrical Stimulation   Electrical Stimulation Location  right lumbar and buttock    Electrical Stimulation Action  IFC    Electrical Stimulation Parameters  supine    Electrical Stimulation Goals  Pain             PT Education - 04/29/18 1000    Education provided  Yes    Education Details  Wms flexion and piriformis stretch    Person(s) Educated  Patient    Methods  Explanation;Demonstration;Handout;Verbal cues    Comprehension  Verbalized understanding       PT Short Term Goals - 04/29/18 1003      PT SHORT TERM GOAL #1   Title  independent with initial HEP    Time  2    Period  Weeks    Status  New  PT Long Term Goals - 04/29/18 1003      PT LONG TERM GOAL #1   Title  Pt will improve TUG to 13.5 seconds to decrease fall risk and have easier motions    Time  8    Period  Weeks    Status  New       PT LONG TERM GOAL #2   Title  increase lumbar ROM 25%    Time  8    Period  Weeks    Status  New      PT LONG TERM GOAL #3   Title  Pt will be independent for home exercises    Time  8    Period  Weeks    Status  New      PT LONG TERM GOAL #4   Title  increase lumbar ROM 25%    Time  8    Period  Weeks    Status  New             Plan - 04/29/18 1001    Clinical Impression Statement  Patient reports that since April she has been having sciatica type pain, she reports that she has had some back pain for years, reports that it limits sitting, walking and shopping, her TUG time was 26 seconds due to pain and antalgic gait on the right.  Lumbar ROM was decreased 75-100% with increased pain, she is very tender and tight in the right buttock and the lumbar area    Clinical Presentation  Stable    Clinical Decision Making  Low    Rehab Potential  Good    PT Frequency  2x / week    PT Duration  8 weeks    PT Treatment/Interventions  ADLs/Self Care Home Management;Electrical Stimulation;Moist Heat;Gait training;Stair training;Functional mobility training;Therapeutic activities;Therapeutic exercise;Balance training;Neuromuscular re-education;Patient/family education;Traction;Manual techniques    PT Next Visit Plan  may try traction, asses her HEP    Consulted and Agree with Plan of Care  Patient       Patient will benefit from skilled therapeutic intervention in order to improve the following deficits and impairments:  Abnormal gait, Decreased range of motion, Difficulty walking, Increased muscle spasms, Decreased activity tolerance, Pain, Decreased balance, Impaired flexibility, Improper body mechanics, Postural dysfunction, Decreased strength, Decreased mobility  Visit Diagnosis: Acute right-sided low back pain with right-sided sciatica - Plan: PT plan of care cert/re-cert  Muscle spasm of back - Plan: PT plan of care cert/re-cert  Difficulty in walking, not elsewhere  classified - Plan: PT plan of care cert/re-cert     Problem List Patient Active Problem List   Diagnosis Date Noted  . Right lumbar radiculopathy 04/10/2018  . Dysphagia 10/09/2017  . Chronic cough 07/16/2017  . Right foot pain 04/28/2016  . Skin rash 04/28/2016  . GAD (generalized anxiety disorder) 04/28/2016  . Chronic insomnia 03/07/2016  . Left Achilles tendinitis 12/08/2015  . Parkinson's disease ([place]) 08/25/2015  . Advanced care planning/counseling discussion 05/26/2015  . Family circumstance 02/23/2015  . Health maintenance examination 05/21/2014  . MDD (major depressive disorder), single episode, moderate ([place]) 11/12/2013  . Dysuria 09/02/2013  . DDD (degenerative disc disease), lumbar 05/31/2013  . Medicare annual wellness visit, subsequent 05/20/2013  . HLD (hyperlipidemia) 05/20/2013  . Vitamin D deficiency 05/09/2013  . Recurrent UTI 01/03/2011  . Syncope 01/03/2011  . RASH-NONVESICULAR 05/11/2010  . GERD 02/22/2010  . CAD (coronary artery disease) 01/25/2010  . Osteoporosis 11/10/2009  . HYPOTENSION, ORTHOSTATIC 12/06/2008  .  Anemia 09/25/2007  . B12 deficiency 05/03/2007  . Fibromyalgia 05/03/2007    Sumner Boast., PT 04/29/2018, 10:05 AM  [address] [place], [place], [address] Phone: [phone]   Fax:  [phone]  Name: [name] MRN: [id number] Date of Birth: [birth year]

## 2018-05-08 ENCOUNTER — Ambulatory Visit: Payer: Medicare Other | Admitting: Physical Therapy

## 2018-05-08 ENCOUNTER — Encounter: Payer: Self-pay | Admitting: Physical Therapy

## 2018-05-08 ENCOUNTER — Other Ambulatory Visit: Payer: Self-pay | Admitting: Family Medicine

## 2018-05-08 DIAGNOSIS — M62830 Muscle spasm of back: Secondary | ICD-10-CM

## 2018-05-08 DIAGNOSIS — M5441 Lumbago with sciatica, right side: Secondary | ICD-10-CM

## 2018-05-08 DIAGNOSIS — R262 Difficulty in walking, not elsewhere classified: Secondary | ICD-10-CM

## 2018-05-08 DIAGNOSIS — M6281 Muscle weakness (generalized): Secondary | ICD-10-CM

## 2018-05-08 NOTE — Telephone Encounter (Signed)
Tamadol Last filled:  03/26/18, #50 Last OV:  04/10/18 Next OV:  08/13/18

## 2018-05-08 NOTE — Therapy (Signed)
[place], [place], [address] Phone: [phone]   Fax:  [phone]  Physical Therapy Treatment  Patient Details  Name: [name] MRN: [id number] Date of Birth: 11-Aug-1946 Referring Provider: Ishmael Holter   Encounter Date: 05/08/2018  PT End of Session - 05/08/18 1343    Visit Number  2    Date for PT Re-Evaluation  06/29/18    PT Start Time  1304    PT Stop Time  1403    PT Time Calculation (min)  59 min    Activity Tolerance  Patient limited by pain    Behavior During Therapy  [facility] for tasks assessed/performed       Past Medical History:  Diagnosis Date  . ANEMIA-NOS 09/25/2007  . Anxiety   . Arthritis   . B12 DEFICIENCY 05/03/2007  . CAD (coronary artery disease) 01/2010   MI, Nishan  . Cardiomyopathy ([place]) 02/08/2010   H/o this 2012 after urosepsis, no recurrence.   . Depression   . FIBROMYALGIA 05/03/2007  . GERD 02/22/2010  . Glaucoma 02/2013   [facility]  . History of CHF (congestive heart failure) 01/2010  . History of colon polyps 2004  . HYPERLIPIDEMIA 12/19/2007  . HYPOTENSION, ORTHOSTATIC 12/06/2008  . Interstitial cystitis    [place] now Dr Amalia Hailey  . Lupus (systemic lupus erythematosus) ([place]) 02/08/2010  . MCTD (mixed connective tissue disease) ([place]) 02/08/2010  . OSTEOPOROSIS 08/2009   bisphosphonate on hold 2/2 dysphagia, on reclast done in August each year  . Parkinson's disease ([place]) 08/25/2015   Dx Dr Carles Collet 07/2015   . 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 Takotsubo cardiomyopathy 2008   due to E coli urosepsis    Past Surgical History:  Procedure Laterality Date  . ABDOMINAL HYSTERECTOMY  1970s   IUD infection - first partial then with oophorectomy (cysts), complication - low blood pressure  . CATARACT EXTRACTION Right   . CHOLECYSTECTOMY     complication - low blood pressure  . COLONOSCOPY  06/2008   h/o polyps but latest WNL,  rec rpt 10 yrs Olevia Perches)  . CYSTOSCOPY  12/2013   abx treatment for recurrent cystitis  . DEXA  04/2013   T -2.9 @ femur, -1.6 @ spine  . DEXA  04/2017   T -2.9 hip, -0.7 spine  . ESOPHAGOGASTRODUODENOSCOPY  12/2017   WNL, regardless esophagus dilated, small HH ([facility])    There were no vitals filed for this visit.  Subjective Assessment - 05/08/18 1306    Subjective  Patient reports that she is hurting, reports that she had some difficulty getting out of bed    Currently in Pain?  Yes    Pain Score  6     Pain Location  Back    Pain Orientation  Right    Pain Descriptors / Indicators  Aching    Aggravating Factors   trying to get up from sitting or lying                       [facility] Adult PT Treatment/Exercise - 05/08/18 0001      Transfers   Comments  worked with her on how to get in and out of bed with less stress on her back, PT demo and explanation, patient verbalized understanding      Lumbar Exercises: Stretches   Piriformis Stretch  2 reps;20 seconds  Lumbar Exercises: Aerobic   Nustep  L4 x 6 min      Lumbar Exercises: Standing   Other Standing Lumbar Exercises  PWR moves in sitting      Lumbar Exercises: Seated   Other Seated Lumbar Exercises  in dyna disc pelvic mobility, isometric hip abduction    Other Seated Lumbar Exercises  weighted ball trunk rotation, and overhead lift, red tband seeted rows, HS curls      Lumbar Exercises: Supine   Other Supine Lumbar Exercises  trial of lumbar sheet traction, this seemed to make her pain worse    Other Supine Lumbar Exercises  feet on ball K2C, trunk rotation      Modalities   Modalities  Electrical Stimulation;Moist Heat      Moist Heat Therapy   Number Minutes Moist Heat  15 Minutes    Moist Heat Location  Lumbar Spine      Electrical Stimulation   Electrical Stimulation Location  right lumbar and buttock    Electrical Stimulation Action  IFC    Electrical Stimulation Parameters  supine     Electrical Stimulation Goals  Pain               PT Short Term Goals - 04/29/18 1003      PT SHORT TERM GOAL #1   Title  independent with initial HEP    Time  2    Period  Weeks    Status  New        PT Long Term Goals - 04/29/18 1003      PT LONG TERM GOAL #1   Title  Pt will improve TUG to 13.5 seconds to decrease fall risk and have easier motions    Time  8    Period  Weeks    Status  New      PT LONG TERM GOAL #2   Title  increase lumbar ROM 25%    Time  8    Period  Weeks    Status  New      PT LONG TERM GOAL #3   Title  Pt will be independent for home exercises    Time  8    Period  Weeks    Status  New      PT LONG TERM GOAL #4   Title  increase lumbar ROM 25%    Time  8    Period  Weeks    Status  New            Plan - 05/08/18 1343    Clinical Impression Statement  Patient reporting not doing all of the exercises, reports that she had some pain with them, also had difficulty lying down and then getting up, she reported understanding the body mechanics as I explained to her but reports that it is hard to do when you are hurting.    PT Next Visit Plan  a trial of sheet traction seemed to [facility] the pain worse, continue with stability exercises    Consulted and Agree with Plan of Care  Patient       Patient will benefit from skilled therapeutic intervention in order to improve the following deficits and impairments:  Abnormal gait, Decreased range of motion, Difficulty walking, Increased muscle spasms, Decreased activity tolerance, Pain, Decreased balance, Impaired flexibility, Improper body mechanics, Postural dysfunction, Decreased strength, Decreased mobility  Visit Diagnosis: Acute right-sided low back pain with right-sided sciatica  Muscle spasm of back  Difficulty in  walking, not elsewhere classified  Muscle weakness (generalized)     Problem List Patient Active Problem List   Diagnosis Date Noted  . Right lumbar radiculopathy  04/10/2018  . Dysphagia 10/09/2017  . Chronic cough 07/16/2017  . Right foot pain 04/28/2016  . Skin rash 04/28/2016  . GAD (generalized anxiety disorder) 04/28/2016  . Chronic insomnia 03/07/2016  . Left Achilles tendinitis 12/08/2015  . Parkinson's disease ([place]) 08/25/2015  . Advanced care planning/counseling discussion 05/26/2015  . Family circumstance 02/23/2015  . Health maintenance examination 05/21/2014  . MDD (major depressive disorder), single episode, moderate ([place]) 11/12/2013  . Dysuria 09/02/2013  . DDD (degenerative disc disease), lumbar 05/31/2013  . Medicare annual wellness visit, subsequent 05/20/2013  . HLD (hyperlipidemia) 05/20/2013  . Vitamin D deficiency 05/09/2013  . Recurrent UTI 01/03/2011  . Syncope 01/03/2011  . RASH-NONVESICULAR 05/11/2010  . GERD 02/22/2010  . CAD (coronary artery disease) 01/25/2010  . Osteoporosis 11/10/2009  . HYPOTENSION, ORTHOSTATIC 12/06/2008  . Anemia 09/25/2007  . B12 deficiency 05/03/2007  . Fibromyalgia 05/03/2007    Sumner Boast., PT 05/08/2018, 1:45 PM  [place] [address] [place], [place], [address] Phone: [phone]   Fax:  [phone]  Name: [name] MRN: [id number] Date of Birth: [birth year]

## 2018-05-09 ENCOUNTER — Encounter: Payer: Medicare Other | Admitting: Physical Therapy

## 2018-05-10 NOTE — Telephone Encounter (Signed)
Eprescribed.

## 2018-05-13 ENCOUNTER — Ambulatory Visit: Payer: Medicare Other | Admitting: Physical Therapy

## 2018-05-13 ENCOUNTER — Encounter: Payer: Self-pay | Admitting: Physical Therapy

## 2018-05-13 DIAGNOSIS — M62830 Muscle spasm of back: Secondary | ICD-10-CM

## 2018-05-13 DIAGNOSIS — R2689 Other abnormalities of gait and mobility: Secondary | ICD-10-CM

## 2018-05-13 DIAGNOSIS — M6281 Muscle weakness (generalized): Secondary | ICD-10-CM

## 2018-05-13 DIAGNOSIS — R2681 Unsteadiness on feet: Secondary | ICD-10-CM

## 2018-05-13 DIAGNOSIS — R262 Difficulty in walking, not elsewhere classified: Secondary | ICD-10-CM

## 2018-05-13 DIAGNOSIS — M5441 Lumbago with sciatica, right side: Secondary | ICD-10-CM

## 2018-05-13 NOTE — Therapy (Signed)
[place], [place], [address] Phone: [phone]   Fax:  [phone]  Physical Therapy Treatment  Patient Details  Name: [name] MRN: [id number] Date of Birth: 11-15-1946 Referring Provider: Ishmael Holter   Encounter Date: 05/13/2018  PT End of Session - 05/13/18 1345    Visit Number  3    Date for PT Re-Evaluation  06/29/18    PT Start Time  1311    PT Stop Time  1400    PT Time Calculation (min)  49 min    Activity Tolerance  Patient limited by pain    Behavior During Therapy  [facility] for tasks assessed/performed       Past Medical History:  Diagnosis Date  . ANEMIA-NOS 09/25/2007  . Anxiety   . Arthritis   . B12 DEFICIENCY 05/03/2007  . CAD (coronary artery disease) 01/2010   MI, Nishan  . Cardiomyopathy ([place]) 02/08/2010   H/o this 2012 after urosepsis, no recurrence.   . Depression   . FIBROMYALGIA 05/03/2007  . GERD 02/22/2010  . Glaucoma 02/2013    eye center  . History of CHF (congestive heart failure) 01/2010  . History of colon polyps 2004  . HYPERLIPIDEMIA 12/19/2007  . HYPOTENSION, ORTHOSTATIC 12/06/2008  . Interstitial cystitis    Ott[place] now Dr Amalia Hailey  . Lupus (systemic lupus erythematosus) ([place]) 02/08/2010  . MCTD (mixed connective tissue disease) ([place]) 02/08/2010  . OSTEOPOROSIS 08/2009   bisphosphonate on hold 2/2 dysphagia, on reclast done in August each year  . Parkinson's disease ([place]) 08/25/2015   Dx Dr Carles Collet 07/2015   . 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 Takotsubo cardiomyopathy 2008   due to E coli urosepsis    Past Surgical History:  Procedure Laterality Date  . ABDOMINAL HYSTERECTOMY  1970s   IUD infection - first partial then with oophorectomy (cysts), complication - low blood pressure  . CATARACT EXTRACTION Right   . CHOLECYSTECTOMY     complication - low blood pressure  . COLONOSCOPY  06/2008   h/o polyps but latest WNL,  rec rpt 10 yrs Olevia Perches)  . CYSTOSCOPY  12/2013   abx treatment for recurrent cystitis  . DEXA  04/2013   T -2.9 @ femur, -1.6 @ spine  . DEXA  04/2017   T -2.9 hip, -0.7 spine  . ESOPHAGOGASTRODUODENOSCOPY  12/2017   WNL, regardless esophagus dilated, small HH ([facility])    There were no vitals filed for this visit.  Subjective Assessment - 05/13/18 1313    Subjective  Patient reports that she was very sore after the last treatment, sore in the back and in the legs    Currently in Pain?  Yes    Pain Score  7     Pain Location  Back    Pain Orientation  Right    Aggravating Factors   feel like the last treatment made me sore                       [facility] Adult PT Treatment/Exercise - 05/13/18 0001      Lumbar Exercises: Stretches   Piriformis Stretch  2 reps;20 seconds      Lumbar Exercises: Aerobic   Nustep  L3 x 6 min      Lumbar Exercises: Standing   Other Standing Lumbar Exercises  marches, calf raises    Other Standing Lumbar Exercises  PWR moves in sitting      Lumbar Exercises: Seated   Other Seated Lumbar Exercises  seated marches, LAQ's, ball squeeze and ankle pumps       Modalities   Modalities  Electrical Stimulation;Moist Heat      Moist Heat Therapy   Number Minutes Moist Heat  15 Minutes    Moist Heat Location  Lumbar Spine      Electrical Stimulation   Electrical Stimulation Location  right lumbar and buttock    Electrical Stimulation Action  IFC    Electrical Stimulation Parameters  sitting    Electrical Stimulation Goals  Pain      Manual Therapy   Manual Therapy  Soft tissue mobilization    Soft tissue mobilization  rigth buttock, gentle as she was sore in this area               PT Short Term Goals - 04/29/18 1003      PT SHORT TERM GOAL #1   Title  independent with initial HEP    Time  2    Period  Weeks    Status  New        PT Long Term Goals - 04/29/18 1003      PT LONG TERM GOAL #1   Title  Pt will improve  TUG to 13.5 seconds to decrease fall risk and have easier motions    Time  8    Period  Weeks    Status  New      PT LONG TERM GOAL #2   Title  increase lumbar ROM 25%    Time  8    Period  Weeks    Status  New      PT LONG TERM GOAL #3   Title  Pt will be independent for home exercises    Time  8    Period  Weeks    Status  New      PT LONG TERM GOAL #4   Title  increase lumbar ROM 25%    Time  8    Period  Weeks    Status  New            Plan - 05/13/18 1345    Clinical Impression Statement  backed down on some exercises as she reported that she was sore since the last treatment, also tried some STM to the right buttock area, she is very tender and sore here.    PT Next Visit Plan  continue with stability, and function    Consulted and Agree with Plan of Care  Patient       Patient will benefit from skilled therapeutic intervention in order to improve the following deficits and impairments:  Abnormal gait, Decreased range of motion, Difficulty walking, Increased muscle spasms, Decreased activity tolerance, Pain, Decreased balance, Impaired flexibility, Improper body mechanics, Postural dysfunction, Decreased strength, Decreased mobility  Visit Diagnosis: Acute right-sided low back pain with right-sided sciatica  Muscle spasm of back  Difficulty in walking, not elsewhere classified  Muscle weakness (generalized)  Other abnormalities of gait and mobility  Unsteady gait     Problem List Patient Active Problem List   Diagnosis Date Noted  . Right lumbar radiculopathy 04/10/2018  . Dysphagia 10/09/2017  . Chronic cough 07/16/2017  . Right foot pain 04/28/2016  . Skin rash 04/28/2016  . GAD (generalized anxiety disorder) 04/28/2016  . Chronic insomnia 03/07/2016  . Left Achilles tendinitis 12/08/2015  . Parkinson's disease (  [place]) 08/25/2015  . Advanced care planning/counseling discussion 05/26/2015  . Family circumstance 02/23/2015  . Health maintenance  examination 05/21/2014  . MDD (major depressive disorder), single episode, moderate ([place]) 11/12/2013  . Dysuria 09/02/2013  . DDD (degenerative disc disease), lumbar 05/31/2013  . Medicare annual wellness visit, subsequent 05/20/2013  . HLD (hyperlipidemia) 05/20/2013  . Vitamin D deficiency 05/09/2013  . Recurrent UTI 01/03/2011  . Syncope 01/03/2011  . RASH-NONVESICULAR 05/11/2010  . GERD 02/22/2010  . CAD (coronary artery disease) 01/25/2010  . Osteoporosis 11/10/2009  . HYPOTENSION, ORTHOSTATIC 12/06/2008  . Anemia 09/25/2007  . B12 deficiency 05/03/2007  . Fibromyalgia 05/03/2007    Sumner Boast., PT 05/13/2018, 1:47 PM  [place], [place], [address] Phone: [phone]   Fax:  [phone]  Name: [name] MRN: [id number] Date of Birth: 1946-04-22

## 2018-05-14 ENCOUNTER — Ambulatory Visit: Payer: Medicare Other

## 2018-05-16 ENCOUNTER — Ambulatory Visit (INDEPENDENT_AMBULATORY_CARE_PROVIDER_SITE_OTHER): Payer: Medicare Other | Admitting: *Deleted

## 2018-05-16 ENCOUNTER — Encounter: Payer: Medicare Other | Admitting: Physical Therapy

## 2018-05-16 ENCOUNTER — Other Ambulatory Visit (INDEPENDENT_AMBULATORY_CARE_PROVIDER_SITE_OTHER): Payer: Medicare Other

## 2018-05-16 DIAGNOSIS — E559 Vitamin D deficiency, unspecified: Secondary | ICD-10-CM | POA: Diagnosis not present

## 2018-05-16 DIAGNOSIS — M818 Other osteoporosis without current pathological fracture: Secondary | ICD-10-CM

## 2018-05-16 DIAGNOSIS — E538 Deficiency of other specified B group vitamins: Secondary | ICD-10-CM | POA: Diagnosis not present

## 2018-05-16 LAB — CALCIUM: Calcium: 9.6 mg/dL (ref 8.4–10.5)

## 2018-05-16 MED ORDER — CYANOCOBALAMIN 1000 MCG/ML IJ SOLN
1000.0000 ug | Freq: Once | INTRAMUSCULAR | Status: AC
  Administered 2018-05-16: 1000 ug via INTRAMUSCULAR

## 2018-05-16 NOTE — Telephone Encounter (Signed)
Spoke to pt and advised. Ca lab and prolia injection scheduled.  

## 2018-05-16 NOTE — Telephone Encounter (Signed)
Verification of benefits have been processed and an approval has been received for pts prolia injection. Pts estimated cost are appx $90. This is only an estimate and cannot be confirmed until benefits are paid. Please advise pt and schedule if needed. If scheduled, once the injection is received, pls contact me back with the date it was received so that I am able to update prolia folder. thanks

## 2018-05-16 NOTE — Progress Notes (Signed)
Per orders of Dr. Gutierrez, injection of b12 given by Lorelle Macaluso H. Patient tolerated injection well.  

## 2018-05-20 ENCOUNTER — Ambulatory Visit: Payer: Medicare Other | Admitting: Physical Therapy

## 2018-05-20 ENCOUNTER — Encounter: Payer: Self-pay | Admitting: Gastroenterology

## 2018-05-21 ENCOUNTER — Ambulatory Visit: Payer: Medicare Other

## 2018-05-21 ENCOUNTER — Ambulatory Visit (INDEPENDENT_AMBULATORY_CARE_PROVIDER_SITE_OTHER): Payer: Medicare Other | Admitting: *Deleted

## 2018-05-21 DIAGNOSIS — M810 Age-related osteoporosis without current pathological fracture: Secondary | ICD-10-CM | POA: Diagnosis not present

## 2018-05-21 MED ORDER — DENOSUMAB 60 MG/ML ~~LOC~~ SOSY
60.0000 mg | PREFILLED_SYRINGE | Freq: Once | SUBCUTANEOUS | Status: AC
  Administered 2018-05-21: 60 mg via SUBCUTANEOUS

## 2018-05-21 NOTE — Progress Notes (Signed)
Per orders of Dr. Gutierrez, injection of Prolia given by [place], Shapale M. Patient tolerated injection well.  

## 2018-06-03 ENCOUNTER — Ambulatory Visit: Payer: Medicare Other | Attending: Neurology | Admitting: Physical Therapy

## 2018-06-03 ENCOUNTER — Encounter: Payer: Self-pay | Admitting: Physical Therapy

## 2018-06-03 DIAGNOSIS — R262 Difficulty in walking, not elsewhere classified: Secondary | ICD-10-CM | POA: Diagnosis present

## 2018-06-03 DIAGNOSIS — M6281 Muscle weakness (generalized): Secondary | ICD-10-CM | POA: Insufficient documentation

## 2018-06-03 DIAGNOSIS — M62830 Muscle spasm of back: Secondary | ICD-10-CM | POA: Insufficient documentation

## 2018-06-03 DIAGNOSIS — M5441 Lumbago with sciatica, right side: Secondary | ICD-10-CM | POA: Insufficient documentation

## 2018-06-03 NOTE — Therapy (Signed)
[place], [place], [address] Phone: [phone]   Fax:  [phone]  Physical Therapy Treatment  Patient Details  Name: [name] MRN: [id number] Date of Birth: 26-Apr-1946 Referring Provider: Ishmael Holter   Encounter Date: 06/03/2018  PT End of Session - 06/03/18 1049    Visit Number  4    Date for PT Re-Evaluation  06/29/18    PT Start Time  5427    PT Stop Time  1112    PT Time Calculation (min)  57 min    Activity Tolerance  Patient limited by pain    Behavior During Therapy  [facility] for tasks assessed/performed       Past Medical History:  Diagnosis Date  . ANEMIA-NOS 09/25/2007  . Anxiety   . Arthritis   . B12 DEFICIENCY 05/03/2007  . CAD (coronary artery disease) 01/2010   MI, Nishan  . Cardiomyopathy ([place]) 02/08/2010   H/o this 2012 after urosepsis, no recurrence.   . Depression   . FIBROMYALGIA 05/03/2007  . GERD 02/22/2010  . Glaucoma 02/2013   [facility]  . History of CHF (congestive heart failure) 01/2010  . History of colon polyps 2004  . HYPERLIPIDEMIA 12/19/2007  . HYPOTENSION, ORTHOSTATIC 12/06/2008  . Interstitial cystitis    Ottelin now Dr Amalia Hailey  . Lupus (systemic lupus erythematosus) ([place]) 02/08/2010  . MCTD (mixed connective tissue disease) ([place]) 02/08/2010  . OSTEOPOROSIS 08/2009   bisphosphonate on hold 2/2 dysphagia, on reclast done in August each year  . Parkinson's disease ([place]) 08/25/2015   Dx Dr Carles Collet 07/2015   . 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 Takotsubo cardiomyopathy 2008   due to E coli urosepsis    Past Surgical History:  Procedure Laterality Date  . ABDOMINAL HYSTERECTOMY  1970s   IUD infection - first partial then with oophorectomy (cysts), complication - low blood pressure  . CATARACT EXTRACTION Right   . CHOLECYSTECTOMY     complication - low blood pressure  . COLONOSCOPY  06/2008   h/o polyps but latest WNL,  rec rpt 10 yrs Olevia Perches)  . CYSTOSCOPY  12/2013   abx treatment for recurrent cystitis  . DEXA  04/2013   T -2.9 @ femur, -1.6 @ spine  . DEXA  04/2017   T -2.9 hip, -0.7 spine  . ESOPHAGOGASTRODUODENOSCOPY  12/2017   WNL, regardless esophagus dilated, small HH ([facility])    There were no vitals filed for this visit.  Subjective Assessment - 06/03/18 1019    Subjective  Patient reports that she has started walking some and reports at times feels better but also having leg and ack pain    Currently in Pain?  Yes    Pain Score  5     Pain Location  Back    Pain Orientation  Right;Left    Aggravating Factors   walking                       [facility] Adult PT Treatment/Exercise - 06/03/18 0001      Lumbar Exercises: Stretches   Passive Hamstring Stretch  5 reps;20 seconds    Single Knee to Chest Stretch  3 reps;10 seconds    Piriformis Stretch  4 reps;20 seconds      Lumbar Exercises: Aerobic   Nustep  L4 x 6 min      Lumbar Exercises: Supine  Pelvic Tilt  20 reps;5 seconds    Bent Knee Raise  20 reps;1 second    Bridge with [employer]  20 reps;1 second    Bridge with clamshell  20 reps;1 second    Other Supine Lumbar Exercises  feet on ball K2C, trunk rotation, very small bridge and abdominal isometrics      Moist Heat Therapy   Number Minutes Moist Heat  15 Minutes    Moist Heat Location  Lumbar Spine      Electrical Stimulation   Electrical Stimulation Location  right lumbar and buttock    Electrical Stimulation Action  IFC    Electrical Stimulation Parameters  supine    Electrical Stimulation Goals  Pain               PT Short Term Goals - 04/29/18 1003      PT SHORT TERM GOAL #1   Title  independent with initial HEP    Time  2    Period  Weeks    Status  New        PT Long Term Goals - 06/03/18 1051      PT LONG TERM GOAL #1   Title  Pt will improve TUG to 13.5 seconds to decrease fall risk and have easier motions    Status   Partially Met      PT LONG TERM GOAL #2   Title  increase lumbar ROM 25%    Status  Partially Met            Plan - 06/03/18 1049    Clinical Impression Statement  Patient needs a lot of verbal and tactile cues for abdominal actvation.  She is in pain and having a difficult time with the exercises, the piriformis is really sore on the right.  Again very difficult to get abdominals    PT Next Visit Plan  continue with stability, and function    Consulted and Agree with Plan of Care  Patient       Patient will benefit from skilled therapeutic intervention in order to improve the following deficits and impairments:  Abnormal gait, Decreased range of motion, Difficulty walking, Increased muscle spasms, Decreased activity tolerance, Pain, Decreased balance, Impaired flexibility, Improper body mechanics, Postural dysfunction, Decreased strength, Decreased mobility  Visit Diagnosis: Acute right-sided low back pain with right-sided sciatica  Muscle spasm of back  Difficulty in walking, not elsewhere classified  Muscle weakness (generalized)     Problem List Patient Active Problem List   Diagnosis Date Noted  . Right lumbar radiculopathy 04/10/2018  . Dysphagia 10/09/2017  . Chronic cough 07/16/2017  . Right foot pain 04/28/2016  . Skin rash 04/28/2016  . GAD (generalized anxiety disorder) 04/28/2016  . Chronic insomnia 03/07/2016  . Left Achilles tendinitis 12/08/2015  . Parkinson's disease ([place]) 08/25/2015  . Advanced care planning/counseling discussion 05/26/2015  . Family circumstance 02/23/2015  . Health maintenance examination 05/21/2014  . MDD (major depressive disorder), single episode, moderate ([place]) 11/12/2013  . Dysuria 09/02/2013  . DDD (degenerative disc disease), lumbar 05/31/2013  . Medicare annual wellness visit, subsequent 05/20/2013  . HLD (hyperlipidemia) 05/20/2013  . Vitamin D deficiency 05/09/2013  . Recurrent UTI 01/03/2011  . Syncope 01/03/2011   . RASH-NONVESICULAR 05/11/2010  . GERD 02/22/2010  . CAD (coronary artery disease) 01/25/2010  . Osteoporosis 11/10/2009  . HYPOTENSION, ORTHOSTATIC 12/06/2008  . Anemia 09/25/2007  . B12 deficiency 05/03/2007  . Fibromyalgia 05/03/2007    Sumner Boast., PT  06/03/2018, 10:52 AM  [place], [place], [address] Phone: [phone]   Fax:  [phone]  Name: TARIA CASTRILLO MRN: [id number] Date of Birth: July 23, 1946

## 2018-06-10 ENCOUNTER — Encounter: Payer: Self-pay | Admitting: Gastroenterology

## 2018-06-11 ENCOUNTER — Other Ambulatory Visit: Payer: Self-pay | Admitting: Internal Medicine

## 2018-06-12 ENCOUNTER — Encounter: Payer: Self-pay | Admitting: Physical Therapy

## 2018-06-12 ENCOUNTER — Ambulatory Visit: Payer: Medicare Other | Admitting: Physical Therapy

## 2018-06-12 DIAGNOSIS — M6281 Muscle weakness (generalized): Secondary | ICD-10-CM

## 2018-06-12 DIAGNOSIS — M5441 Lumbago with sciatica, right side: Secondary | ICD-10-CM

## 2018-06-12 DIAGNOSIS — R262 Difficulty in walking, not elsewhere classified: Secondary | ICD-10-CM

## 2018-06-12 DIAGNOSIS — M62830 Muscle spasm of back: Secondary | ICD-10-CM

## 2018-06-12 NOTE — Patient Instructions (Signed)

## 2018-06-12 NOTE — Therapy (Signed)
[place], [place], [address] Phone: [phone]   Fax:  [phone]  Physical Therapy Treatment  Patient Details  Name: COLLETTE PESCADOR MRN: [id number] Date of Birth: [birth year] Referring Provider: Ishmael Holter   Encounter Date: 06/12/2018  PT End of Session - 06/12/18 1044    Visit Number  5    Date for PT Re-Evaluation  06/29/18    PT Start Time  1013    PT Stop Time  1100    PT Time Calculation (min)  47 min    Activity Tolerance  Patient limited by pain    Behavior During Therapy  [facility] for tasks assessed/performed       Past Medical History:  Diagnosis Date  . ANEMIA-NOS 09/25/2007  . Anxiety   . Arthritis   . B12 DEFICIENCY 05/03/2007  . CAD (coronary artery disease) 01/2010   MI, Nishan  . Cardiomyopathy ([place]) 02/08/2010   H/o this 2012 after urosepsis, no recurrence.   . Depression   . FIBROMYALGIA 05/03/2007  . GERD 02/22/2010  . Glaucoma 02/2013   [facility]  . History of CHF (congestive heart failure) 01/2010  . History of colon polyps 2004  . HYPERLIPIDEMIA 12/19/2007  . HYPOTENSION, ORTHOSTATIC 12/06/2008  . Interstitial cystitis    Ott[facility] now Dr Amalia Hailey  . Lupus (systemic lupus erythematosus) ([place]) 02/08/2010  . MCTD (mixed connective tissue disease) ([place]) 02/08/2010  . OSTEOPOROSIS 08/2009   bisphosphonate on hold 2/2 dysphagia, on reclast done in August each year  . Parkinson's disease ([place]) 08/25/2015   Dx Dr Carles Collet 07/2015   . 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 Takotsubo cardiomyopathy 2008   due to E coli urosepsis    Past Surgical History:  Procedure Laterality Date  . ABDOMINAL HYSTERECTOMY  1970s   IUD infection - first partial then with oophorectomy (cysts), complication - low blood pressure  . CATARACT EXTRACTION Right   . CHOLECYSTECTOMY     complication - low blood pressure  . COLONOSCOPY  06/2008   h/o polyps but latest WNL,  rec rpt 10 yrs Olevia Perches)  . CYSTOSCOPY  12/2013   abx treatment for recurrent cystitis  . DEXA  04/2013   T -2.9 @ femur, -1.6 @ spine  . DEXA  04/2017   T -2.9 hip, -0.7 spine  . ESOPHAGOGASTRODUODENOSCOPY  12/2017   WNL, regardless esophagus dilated, small HH ([facility])    There were no vitals filed for this visit.  Subjective Assessment - 06/12/18 1022    Subjective  Patient reports that she has been very busy and has been in more pain, daughter having surgery    Currently in Pain?  Yes    Pain Score  8     Pain Location  Buttocks    Pain Orientation  Right    Pain Descriptors / Indicators  Aching;Sore    Aggravating Factors   walking                       [facility] Adult PT Treatment/Exercise - 06/12/18 0001      Knee/Hip Exercises: Stretches   Piriformis Stretch  4 reps;20 seconds;Both      Modalities   Modalities  Electrical Stimulation;Moist Heat      Moist Heat Therapy   Number Minutes Moist Heat  15 Minutes    Moist Heat Location  Lumbar Spine  Acupuncturist Location  right lumbar and buttock    Electrical Stimulation Action  IFC    Electrical Stimulation Parameters  supine    Electrical Stimulation Goals  Pain      Manual Therapy   Manual Therapy  Soft tissue mobilization    Soft tissue mobilization  low back parapsinals, right buttock       Trigger Point Dry Needling - 06/12/18 1043    Gluteus Maximus Response  Twitch response elicited    Gluteus Minimus Response  Palpable increased muscle length    Piriformis Response  Palpable increased muscle length             PT Short Term Goals - 04/29/18 1003      PT SHORT TERM GOAL #1   Title  independent with initial HEP    Time  2    Period  Weeks    Status  New        PT Long Term Goals - 06/12/18 1047      PT LONG TERM GOAL #1   Title  Pt will improve TUG to 13.5 seconds to decrease fall risk and have easier motions    Status  Partially Met       PT LONG TERM GOAL #3   Title  Pt will be independent for home exercises    Status  Partially Met            Plan - 06/12/18 1045    Clinical Impression Statement  Patient with higher rating of pain mostly in the right buttock with c/o pain in the right leg especially with activity, she does reports stress in her life, she has a daughter and two grandchildren that lives with her and her daughter is having surgery soon.  She had very tender areas in the right buttock area, she was not sure if she liked the dry needles when leaving, her pain level prior was 8/10, and leaving was a 7/10    PT Next Visit Plan  see if needling helps    Consulted and Agree with Plan of Care  Patient       Patient will benefit from skilled therapeutic intervention in order to improve the following deficits and impairments:  Abnormal gait, Decreased range of motion, Difficulty walking, Increased muscle spasms, Decreased activity tolerance, Pain, Decreased balance, Impaired flexibility, Improper body mechanics, Postural dysfunction, Decreased strength, Decreased mobility  Visit Diagnosis: Acute right-sided low back pain with right-sided sciatica  Muscle spasm of back  Difficulty in walking, not elsewhere classified  Muscle weakness (generalized)     Problem List Patient Active Problem List   Diagnosis Date Noted  . Right lumbar radiculopathy 04/10/2018  . Dysphagia 10/09/2017  . Chronic cough 07/16/2017  . Right foot pain 04/28/2016  . Skin rash 04/28/2016  . GAD (generalized anxiety disorder) 04/28/2016  . Chronic insomnia 03/07/2016  . Left Achilles tendinitis 12/08/2015  . Parkinson's disease ([place]) 08/25/2015  . Advanced care planning/counseling discussion 05/26/2015  . Family circumstance 02/23/2015  . Health maintenance examination 05/21/2014  . MDD (major depressive disorder), single episode, moderate ([place]) 11/12/2013  . Dysuria 09/02/2013  . DDD (degenerative disc disease), lumbar  05/31/2013  . Medicare annual wellness visit, subsequent 05/20/2013  . HLD (hyperlipidemia) 05/20/2013  . Vitamin D deficiency 05/09/2013  . Recurrent UTI 01/03/2011  . Syncope 01/03/2011  . RASH-NONVESICULAR 05/11/2010  . GERD 02/22/2010  . CAD (coronary artery disease) 01/25/2010  . Osteoporosis 11/10/2009  .  HYPOTENSION, ORTHOSTATIC 12/06/2008  . Anemia 09/25/2007  . B12 deficiency 05/03/2007  . Fibromyalgia 05/03/2007    Sumner Boast., PT 06/12/2018, 10:48 AM  [place] [address] [place], [place], [address] Phone: [phone]   Fax:  [phone]  Name: MANDOLIN FALWELL MRN: [id number] Date of Birth: 1946-11-23

## 2018-06-15 ENCOUNTER — Other Ambulatory Visit: Payer: Self-pay | Admitting: Family Medicine

## 2018-06-17 NOTE — Telephone Encounter (Signed)
Pharmacy requests refill on Tramadol  Name of Medication: Tramadol 50mg  take 1 po tid prn pain Name of Pharmacy: [facility] or Written Date and Quantity:  #50 on 05/10/18 Last Office Visit and Type: 04/10/18 follow up 27mth R lumbar radiculopathy  Next Office Visit and Type: 08/08/18 follow up  Last Controlled Substance Agreement Date: N/A  Last UDS: N/A    Will pend for MD approval

## 2018-06-17 NOTE — Telephone Encounter (Signed)
Sent. Thanks.   

## 2018-06-19 ENCOUNTER — Other Ambulatory Visit: Payer: Self-pay | Admitting: Family Medicine

## 2018-06-19 ENCOUNTER — Ambulatory Visit: Payer: Medicare Other | Admitting: Physical Therapy

## 2018-06-20 ENCOUNTER — Ambulatory Visit: Payer: Medicare Other

## 2018-06-21 ENCOUNTER — Other Ambulatory Visit: Payer: Self-pay | Admitting: Neurology

## 2018-06-24 ENCOUNTER — Encounter: Payer: Self-pay | Admitting: Family Medicine

## 2018-06-24 ENCOUNTER — Ambulatory Visit: Payer: Medicare Other | Admitting: Family Medicine

## 2018-06-24 ENCOUNTER — Ambulatory Visit: Payer: Medicare Other | Admitting: Physical Therapy

## 2018-06-24 ENCOUNTER — Ambulatory Visit: Payer: Self-pay

## 2018-06-24 VITALS — BP 112/66 | HR 66 | Ht 66.0 in | Wt 167.0 lb

## 2018-06-24 DIAGNOSIS — M7989 Other specified soft tissue disorders: Secondary | ICD-10-CM | POA: Diagnosis not present

## 2018-06-24 DIAGNOSIS — D649 Anemia, unspecified: Secondary | ICD-10-CM

## 2018-06-24 LAB — CBC
HCT: 31.6 % — ABNORMAL LOW (ref 36.0–46.0)
Hemoglobin: 10.3 g/dL — ABNORMAL LOW (ref 12.0–15.0)
MCHC: 32.7 g/dL (ref 30.0–36.0)
MCV: 80.9 fl (ref 78.0–100.0)
Platelets: 222 10*3/uL (ref 150.0–400.0)
RBC: 3.9 Mil/uL (ref 3.87–5.11)
RDW: 16.2 % — AB (ref 11.5–15.5)
WBC: 5.9 10*3/uL (ref 4.0–10.5)

## 2018-06-24 NOTE — Telephone Encounter (Signed)
  Reason for Disposition . [1] Thigh, calf, or ankle swelling AND [2] bilateral AND [3] 1 side is more swollen  Answer Assessment - Initial Assessment Questions 1. ONSET: "When did the swelling start?" (e.g., minutes, hours, days)     Started last night  2. LOCATION: "What part of the leg is swollen?"  "Are both legs swollen or just one leg?"     Feet and legs up to knees; left greater than right    3. SEVERITY: "How bad is the swelling?" (e.g., localized; mild, moderate, severe)  - Localized - small area of swelling localized to one leg  - MILD pedal edema - swelling limited to foot and ankle, pitting edema < 1/4 inch (6 mm) deep, rest and elevation eliminate most or all swelling  - MODERATE edema - swelling of lower leg to knee, pitting edema > 1/4 inch (6 mm) deep, rest and elevation only partially reduce swelling  - SEVERE edema - swelling extends above knee, facial or hand swelling present      Moderate 4. REDNESS: "Does the swelling look red or infected?"     Denied redness  5. PAIN: "Is the swelling painful to touch?" If so, ask: "How painful is it?"   (Scale 1-10; mild, moderate or severe)     Legs feel tight; c/o feet hurting.  6. FEVER: "Do you have a fever?" If so, ask: "What is it, how was it measured, and when did it start?"      Denied fever or chills 7. CAUSE: "What do you think is causing the leg swelling?"     unknown 8. MEDICAL HISTORY: "Do you have a history of heart failure, kidney disease, liver failure, or cancer?"     Parkinson's  9. RECURRENT SYMPTOM: "Have you had leg swelling before?" If so, ask: "When was the last time?" "What happened that time?"     No, not like this ; has had mild swelling in right foot 10. OTHER SYMPTOMS: "Do you have any other symptoms?" (e.g., chest pain, difficulty breathing)       Denied shortness of breath or chest pain 11. PREGNANCY: "Is there any chance you are pregnant?" "When was your last menstrual period?"       N/a  Protocols  used: LEG SWELLING AND EDEMA-A-AH

## 2018-06-24 NOTE — Progress Notes (Signed)
[name] - 72 y.o. female MRN [id number]  Date of birth: March 24, 1946  SUBJECTIVE:  Including CC & ROS.  Chief Complaint  Patient presents with  . Leg Swelling    [name] is a 72 y.o. female that is presenting with bilateral leg swelling. Her left leg and foot are worse than right. She noticed symptoms this morning. Swelling present below her knee and extends down to her feet.  Her legs feel heavy and difficult to walk. Denies injury or trauma. Denies changes in her health or new medications. Denies bruising.    Review of Systems  Constitutional: Negative for fever.  HENT: Negative for congestion.   Respiratory: Negative for cough.   Cardiovascular: Positive for leg swelling.  Gastrointestinal: Negative for abdominal pain.  Musculoskeletal: Positive for gait problem.  Skin: Negative for color change.  Neurological: Negative for weakness.  Hematological: Negative for adenopathy.  Psychiatric/Behavioral: Negative for agitation.    HISTORY: Past Medical, Surgical, Social, and Family History Reviewed & Updated per EMR.   Pertinent Historical Findings include:  Past Medical History:  Diagnosis Date  . ANEMIA-NOS 09/25/2007  . Anxiety   . Arthritis   . B12 DEFICIENCY 05/03/2007  . CAD (coronary artery disease) 01/2010   MI, Nishan  . Cardiomyopathy ([place]) 02/08/2010   H/o this 2012 after urosepsis, no recurrence.   . Depression   . FIBROMYALGIA 05/03/2007  . GERD 02/22/2010  . Glaucoma 02/2013   [facility]  . History of CHF (congestive heart failure) 01/2010  . History of colon polyps 2004  . HYPERLIPIDEMIA 12/19/2007  . HYPOTENSION, ORTHOSTATIC 12/06/2008  . Interstitial cystitis    Ott[facility] now Dr Amalia Hailey  . Lupus (systemic lupus erythematosus) ([place]) 02/08/2010  . MCTD (mixed connective tissue disease) ([place]) 02/08/2010  . OSTEOPOROSIS 08/2009   bisphosphonate on hold 2/2 dysphagia, on reclast done in August each year  . Parkinson's disease ([place]) 08/25/2015   Dx Dr  Carles Collet 07/2015   . 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 Takotsubo cardiomyopathy 2008   due to E coli urosepsis    Past Surgical History:  Procedure Laterality Date  . ABDOMINAL HYSTERECTOMY  1970s   IUD infection - first partial then with oophorectomy (cysts), complication - low blood pressure  . CATARACT EXTRACTION Right   . CHOLECYSTECTOMY     complication - low blood pressure  . COLONOSCOPY  06/2008   h/o polyps but latest WNL, rec rpt 10 yrs Olevia Perches)  . CYSTOSCOPY  12/2013   abx treatment for recurrent cystitis  . DEXA  04/2013   T -2.9 @ femur, -1.6 @ spine  . DEXA  04/2017   T -2.9 hip, -0.7 spine  . ESOPHAGOGASTRODUODENOSCOPY  12/2017   WNL, regardless esophagus dilated, small HH ([facility])    Allergies  Allergen Reactions  . Amitriptyline Other (See Comments)    Sedated next morning  . Ciprofloxacin Nausea And Vomiting  . Cymbalta [Duloxetine Hcl] Other (See Comments)    Worsened depression  . Imipramine Hcl     REACTION: rash  . Iohexol      Code: HIVES, Desc: PT developed 2 hives, followed by SOB, severe headache post 87cc's Omnipaque 300., Onset Date: [id number]   . Lyrica [Pregabalin] Other (See Comments)    Tried during hospitalization - unsure effects but unable to tolerate  . Morphine Sulfate     REACTION: rash  . Sulfamethoxazole     REACTION: rash  . Lidocaine  Hcl Rash  . Neosporin [Neomycin-Bacitracin Zn-Polymyx] Rash    Worsened skin breaking out  . Tetracyclines & Related Rash    Family History  Problem Relation Age of Onset  . Heart attack Father   . Diabetes Father   . Prostate cancer Father   . Esophageal cancer Mother   . Lung cancer Brother   . Breast cancer Sister   . Ovarian cancer Sister   . Lung cancer Brother   . CAD Brother   . Uterine cancer Sister   . Clotting disorder Son      Social History   Socioeconomic History  . Marital status: Married    Spouse name: Not on file  . Number of children: 4  .  Years of education: Not on file  . Highest education level: Not on file  Occupational History  . Occupation: retired  Scientific laboratory technician  . Financial resource strain: Not on file  . Food insecurity:    Worry: Not on file    Inability: Not on file  . Transportation needs:    Medical: Not on file    Non-medical: Not on file  Tobacco Use  . Smoking status: Never Smoker  . Smokeless tobacco: Never Used  Substance and Sexual Activity  . Alcohol use: No  . Drug use: No  . Sexual activity: Never  Lifestyle  . Physical activity:    Days per week: Not on file    Minutes per session: Not on file  . Stress: Not on file  Relationships  . Social connections:    Talks on phone: Not on file    Gets together: Not on file    Attends religious service: Not on file    Active member of club or organization: Not on file    Attends meetings of clubs or organizations: Not on file    Relationship status: Not on file  . Intimate partner violence:    Fear of current or ex partner: Not on file    Emotionally abused: Not on file    Physically abused: Not on file    Forced sexual activity: Not on file  Other Topics Concern  . Not on file  Social History Narrative   Widow - husband [name]) passed away March 04, 2013.     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   Activity: limited by back pain     PHYSICAL EXAM:  VS: BP 112/66 (BP Location: Left Arm, Patient Position: Sitting, Cuff Size: Normal)   Pulse 66   Ht 5\' 6"  (1.676 m)   Wt 167 lb (75.8 kg)   SpO2 97%   BMI 26.95 kg/m  Physical Exam Gen: NAD, alert, cooperative with exam, well-appearing ENT: normal lips, normal nasal mucosa,  Eye: normal EOM, normal conjunctiva and lids CV:  +1 pitting edema b/l mid tibia, +2 pedal pulses   Resp: no accessory muscle use, non-labored,  Skin: no rashes, no areas of induration  Neuro:  normal tone, normal sensation to touch Psych:  normal insight, alert and oriented MSK: normal strength      ASSESSMENT & PLAN:   Leg swelling Acutely occurring for one day. No history of HF. No change in medications. Has been walking in the morning. No recent travel. No shortness of breath  - cbc to check Hgb with history of anemia  - elevation and compression  -  given indications to follow up.

## 2018-06-24 NOTE — Assessment & Plan Note (Signed)
Acutely occurring for one day. No history of HF. No change in medications. Has been walking in the morning. No recent travel. No shortness of breath  - cbc to check Hgb with history of anemia  - elevation and compression  - given indications to follow up.

## 2018-06-24 NOTE — Patient Instructions (Signed)
Nice to meet you  I will call you with the results from today  Please try elevation and compression  Please let me know if the swelling doesn't improve with these recommendations.

## 2018-06-24 NOTE — Telephone Encounter (Signed)
Phone call from pt. with c/o increased swelling in bilateral feet and lower legs.   Reported has had swelling in her right foot, and noted last night, that both her feet were swollen.  When she woke up today noted the swelling was in both feet and lower legs up to the knee, with left > right.  Denied any localized redness or warmth of the extremities.  Stated her feet hurt when she walks, and she "feels like she is walking on cotton."  Reported both her legs hurt today.  Denied shortness of breath or chest pain. Advised will need to be evaluated today, to r/o DVT.  No avail. appts. at [facility].  Appt. given at [facility] @ 1:20 PM.  Care advice given per protocol.   Verb. Understanding.  Agrees with plan.           Reason for Disposition . [1] MODERATE leg swelling (e.g., swelling extends up to knees) AND [2] new onset or worsening  Answer Assessment - Initial Assessment Questions 1. ONSET: "When did the swelling start?" (e.g., minutes, hours, days)     Started last night  2. LOCATION: "What part of the leg is swollen?"  "Are both legs swollen or just one leg?"     Feet and legs up to knees; left greater than right    3. SEVERITY: "How bad is the swelling?" (e.g., localized; mild, moderate, severe)  - Localized - small area of swelling localized to one leg  - MILD pedal edema - swelling limited to foot and ankle, pitting edema < 1/4 inch (6 mm) deep, rest and elevation eliminate most or all swelling  - MODERATE edema - swelling of lower leg to knee, pitting edema > 1/4 inch (6 mm) deep, rest and elevation only partially reduce swelling  - SEVERE edema - swelling extends above knee, facial or hand swelling present      Moderate 4. REDNESS: "Does the swelling look red or infected?"     Denied redness  5. PAIN: "Is the swelling painful to touch?" If so, ask: "How painful is it?"   (Scale 1-10; mild, moderate or severe)     Legs feel tight; c/o feet hurting.  6. FEVER: "Do you have a  fever?" If so, ask: "What is it, how was it measured, and when did it start?"      Denied fever or chills 7. CAUSE: "What do you think is causing the leg swelling?"     unknown 8. MEDICAL HISTORY: "Do you have a history of heart failure, kidney disease, liver failure, or cancer?"     Parkinson's  9. RECURRENT SYMPTOM: "Have you had leg swelling before?" If so, ask: "When was the last time?" "What happened that time?"     No, not like this ; has had mild swelling in right foot 10. OTHER SYMPTOMS: "Do you have any other symptoms?" (e.g., chest pain, difficulty breathing)       Denied shortness of breath or chest pain 11. PREGNANCY: "Is there any chance you are pregnant?" "When was your last menstrual period?"       N/a  Protocols used: LEG SWELLING AND EDEMA-A-AH

## 2018-06-25 ENCOUNTER — Telehealth: Payer: Self-pay | Admitting: Family Medicine

## 2018-06-25 ENCOUNTER — Ambulatory Visit: Payer: Medicare Other | Admitting: Family Medicine

## 2018-06-25 NOTE — Telephone Encounter (Signed)
Spoke with patient about her labs.   Tara Ax, MD [facility] 06/25/2018, 2:35 PM

## 2018-07-03 ENCOUNTER — Ambulatory Visit: Payer: Medicare Other | Admitting: Neurology

## 2018-07-10 ENCOUNTER — Ambulatory Visit: Payer: Medicare Other | Admitting: Family Medicine

## 2018-07-10 ENCOUNTER — Encounter: Payer: Self-pay | Admitting: Family Medicine

## 2018-07-10 VITALS — BP 114/72 | HR 66 | Temp 97.7°F | Wt 163.0 lb

## 2018-07-10 DIAGNOSIS — D649 Anemia, unspecified: Secondary | ICD-10-CM | POA: Diagnosis not present

## 2018-07-10 DIAGNOSIS — E538 Deficiency of other specified B group vitamins: Secondary | ICD-10-CM

## 2018-07-10 DIAGNOSIS — R21 Rash and other nonspecific skin eruption: Secondary | ICD-10-CM

## 2018-07-10 MED ORDER — PERMETHRIN 5 % EX CREA: 1.0000 "application " | TOPICAL_CREAM | Freq: Once | CUTANEOUS | 0 refills | Status: AC

## 2018-07-10 MED ORDER — TRIAMCINOLONE ACETONIDE 0.1 % EX CREA: 1.0000 "application " | TOPICAL_CREAM | Freq: Two times a day (BID) | CUTANEOUS | 0 refills | Status: DC

## 2018-07-10 MED ORDER — CYANOCOBALAMIN 1000 MCG/ML IJ SOLN
1000.0000 ug | Freq: Once | INTRAMUSCULAR | Status: AC
  Administered 2018-07-10: 1000 ug via INTRAMUSCULAR

## 2018-07-10 NOTE — Addendum Note (Signed)
Addended by: Brenton Grills on: 8/46/6599 35:70 PM   Modules accepted: Orders

## 2018-07-10 NOTE — Assessment & Plan Note (Addendum)
Possible scabies given intensely pruritic rash and distribution. Treat with permethrin. rec triamcinolone cream PRN itch, oatmeal bath for itch. 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 Pt agrees with plan.

## 2018-07-10 NOTE — Assessment & Plan Note (Signed)
b12 shot today. 

## 2018-07-10 NOTE — Patient Instructions (Addendum)
Possible scabies. Treat with permethrin cream sent to pharmacy.  May use stronger steroid cream sent to pharmacy as well.  Oatmeal baths for itching, don't use too hot water.  If persistent despite above, let me know for dermatology referral.  B12 shot today

## 2018-07-10 NOTE — Progress Notes (Signed)
BP 114/72   Pulse 66   Temp 97.7 F (36.5 C) (Oral)   Wt 163 lb (73.9 kg)   SpO2 97%   BMI 26.31 kg/m    CC: rash on arm Subjective:    Patient ID: [name], female    DOB: 1946-10-09, 72 y.o.   MRN: [id number]  HPI: [name] is a 72 y.o. female presenting on 07/10/2018 for Rash (on arm x 2 days... right arm...very itchy and burns with shower... pt has tried OTC steroid cream w/ no relief)   3d h/o R arm rash that started after going to grocery store. Started at [facility] fossa, now spreading up and down arm. Itching pain. Burns after sun exposure, during showers.   No oral lesions, fevers/chills, nausea, joint pains No new medicines, supplements.  No new lotions, detergents, soaps or shampoos.  Newest medicine has been alfuzosin 02/2018.  No recent hotel or motel stay, no new beds.  No similar rash at home.   Has tried cortisone 10 cream without benefit.   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   Requests B12 shot today.   Relevant past medical, surgical, family and social history reviewed and updated as indicated. Interim medical history since our last visit reviewed. Allergies and medications reviewed and updated. Outpatient Medications Prior to Visit  Medication Sig Dispense Refill  . alfuzosin (UROXATRAL) 10 MG 24 hr tablet Take 1 tablet by mouth daily.    . Calcium Carbonate-Vitamin D (CALCIUM 600/VITAMIN D) 600-400 MG-UNIT chew tablet Chew 1 tablet by mouth daily.    . carbidopa-levodopa (SINEMET CR) 50-200 MG tablet TAKE 1 TABLET BY MOUTH AT BEDTIME. 90 tablet 1  . carbidopa-levodopa (SINEMET IR) 25-100 MG tablet Take 1.5 tablets by mouth 3 (three) times daily. 405 tablet 1  . clonazePAM (KLONOPIN) 0.5 MG tablet TAKE 1 TABLET BY MOUTH TWICE A DAY AS NEEDED 60 tablet 1  . cyanocobalamin (,VITAMIN B-12,) 1000 MCG/ML injection Inject 1 mL (1,000 mcg total) into the muscle every  30 (thirty) days.    Marland Kitchen gabapentin (NEURONTIN) 100 MG capsule Take 1 capsule (100 mg total) by mouth at bedtime. 90 capsule 0  . NORTHERA 300 MG CAPS TAKE 2 CAPSULES BY MOUTH THREE TIMES DAILY 540 capsule 1  . pantoprazole (PROTONIX) 40 MG tablet TAKE 1 TABLET (40 MG TOTAL) BY MOUTH DAILY. TAKE 30-60 MIN BEFORE FIRST MEAL OF THE DAY 30 tablet 2  . polyethylene glycol (MIRALAX / GLYCOLAX) packet Take 17 g by mouth as needed. 30 each 1  . pramipexole (MIRAPEX) 1 MG tablet Take 1 tablet (1 mg total) by mouth 3 (three) times daily. 270 tablet 1  . sertraline (ZOLOFT) 100 MG tablet TAKE 1 TABLET (100 MG TOTAL) BY MOUTH DAILY. 90 tablet 0  . timolol (BETIMOL) 0.5 % ophthalmic solution 1 drop 2 (two) times daily.    . traMADol (ULTRAM) 50 MG tablet TAKE 1 TABLET BY MOUTH 3 TIMES A DAY AS NEEDED 50 tablet 0  . triamcinolone cream (KENALOG) 0.5 % Apply to affected area twice daily for no more than 2 weeks at a time (Patient taking differently: as needed. Apply to affected area twice daily for no more than 2 weeks at a time) 30 g 1  . 0.9 %  sodium chloride infusion      No facility-administered medications prior to visit.  Per HPI unless specifically indicated in ROS section below Review of Systems     Objective:    BP 114/72   Pulse 66   Temp 97.7 F (36.5 C) (Oral)   Wt 163 lb (73.9 kg)   SpO2 97%   BMI 26.31 kg/m   Wt Readings from Last 3 Encounters:  07/10/18 163 lb (73.9 kg)  06/24/18 167 lb (75.8 kg)  04/10/18 163 lb 8 oz (74.2 kg)    Physical Exam  Constitutional: She appears well-developed and well-nourished. No distress.  Musculoskeletal: She exhibits no edema.  Skin: Skin is warm and dry. Rash noted. There is erythema.  Intensely pruritic papular rash R AC fossa with spread up and down arm, some sores at upper arm and dorsal hand, excoriations.  Fewer papules L distal forearm into dorsal hand Few pruritic papules posterior neck   Nursing note and vitals reviewed.       Assessment & Plan:   Problem List Items Addressed This Visit    Skin rash - Primary    Possible scabies given intensely pruritic rash and distribution. Treat with permethrin. rec triamcinolone cream PRN itch, oatmeal bath for itch. 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 Pt agrees with plan.       B12 deficiency    b12 shot today      Anemia    Latest Hgb down to 10 - recheck next labwork.           Meds ordered this encounter  Medications  . permethrin (ELIMITE) 5 % cream    Sig: Apply 1 application topically once for 1 dose. Apply throughout skin to neck and below, wash off after 8 hours    Dispense:  60 g    Refill:  0  . triamcinolone cream (KENALOG) 0.1 %    Sig: Apply 1 application topically 2 (two) times daily. Apply to AA.    Dispense:  45 g    Refill:  0   No orders of the defined types were placed in this encounter.   Follow up plan: Return if symptoms worsen or fail to improve.  [name], MD

## 2018-07-10 NOTE — Assessment & Plan Note (Signed)
Latest Hgb down to 10 - recheck next labwork.

## 2018-07-23 ENCOUNTER — Telehealth: Payer: Self-pay | Admitting: *Deleted

## 2018-07-23 NOTE — Telephone Encounter (Signed)
1705called patient again. Patient answered the telephone. Inquired about her rash. Patient stating "Its healed, only a couple of small areas.""I had cataract surgery, since the 14th  and they said it was fine." patient is still using her cream. Encouraged the patient to continue her medication.patient agreed.statinmgshe will show the nurse on Friday.

## 2018-07-23 NOTE — Telephone Encounter (Signed)
[name] the patient to check on status of her rash. Seen by PCP on 07/10/18.Treatment prescribed by PCP for possible Scabies. Requested a call back.

## 2018-07-25 ENCOUNTER — Telehealth: Payer: Self-pay

## 2018-07-25 NOTE — Telephone Encounter (Signed)
Called [name] and discussed her rash from scabies per Dr Danise Mina. [name] states she has open sores that she is treating with cream from the doctor  Informed [name], she is contagious and we would need to protect her and other patients here in our facility. Per Joylene John, all wounds need to be healed prior to coming into the endoscopy unit. We will need to cancel her PV on 07/26/18 and cancel her colon on 08/09/18. The [name] has a follow-up appt with Dr Danise Mina on 08/06/18 and  we would need medical clearance from the doctor prior to her colon. The [name] understood.  She will call back after her appointment with Dr Danise Mina on 08/06/18 to reschedule her colon and PV. Cornelia Copa RN

## 2018-08-06 ENCOUNTER — Ambulatory Visit: Payer: Medicare Other | Admitting: Family Medicine

## 2018-08-06 ENCOUNTER — Encounter: Payer: Self-pay | Admitting: Family Medicine

## 2018-08-06 VITALS — BP 118/64 | HR 72 | Temp 97.8°F | Ht 65.0 in | Wt 162.0 lb

## 2018-08-06 DIAGNOSIS — M5136 Other intervertebral disc degeneration, lumbar region: Secondary | ICD-10-CM

## 2018-08-06 DIAGNOSIS — M25561 Pain in right knee: Secondary | ICD-10-CM

## 2018-08-06 DIAGNOSIS — M25562 Pain in left knee: Secondary | ICD-10-CM

## 2018-08-06 DIAGNOSIS — K5900 Constipation, unspecified: Secondary | ICD-10-CM

## 2018-08-06 DIAGNOSIS — M5416 Radiculopathy, lumbar region: Secondary | ICD-10-CM

## 2018-08-06 DIAGNOSIS — K5909 Other constipation: Secondary | ICD-10-CM | POA: Insufficient documentation

## 2018-08-06 DIAGNOSIS — M797 Fibromyalgia: Secondary | ICD-10-CM

## 2018-08-06 HISTORY — DX: Pain in right knee: M25.561

## 2018-08-06 HISTORY — DX: Constipation, unspecified: K59.00

## 2018-08-06 MED ORDER — CLONAZEPAM 0.5 MG PO TABS: 0.5000 mg | ORAL_TABLET | Freq: Two times a day (BID) | ORAL | 1 refills | Status: DC | PRN

## 2018-08-06 MED ORDER — TRAMADOL HCL 50 MG PO TABS: ORAL_TABLET | ORAL | 0 refills | Status: DC

## 2018-08-06 NOTE — Assessment & Plan Note (Signed)
Discussed management. rec daily miralax and add on senna or soluble fiber PRN.  Encouraged good hydration status.

## 2018-08-06 NOTE — Assessment & Plan Note (Addendum)
No reproducible myelopathy/radiculopathy on exam today. PT not as effective as was hoped. Exam today more suspicious for degenerative disc disease changes + FM. rec trial scheduled tylenol 500mg  tid. Update with effect, if worsening consider lumbar MR.

## 2018-08-06 NOTE — Assessment & Plan Note (Signed)
L>R - anticipate bursitis + degenerative arthritis changes. Provided with bursitis exercises, recommended regular use of supportive knee sleeve brace, schedule tylenol 500gm tid.

## 2018-08-06 NOTE — Progress Notes (Signed)
BP 118/64 (BP Location: Left Arm, Patient Position: Sitting, Cuff Size: Normal)   Pulse 72   Temp 97.8 F (36.6 C) (Oral)   Ht 5\' 5"  (1.651 m)   Wt 162 lb (73.5 kg)   SpO2 98%   BMI 26.96 kg/m    CC: back pain Subjective:    Patient ID: [name], female    DOB: 1946/07/30, 72 y.o.   MRN: [id number]  HPI: [name] is a 72 y.o. female presenting on 08/06/2018 for Back Pain (Here for low back pain f/u.); Knee Pain (C/o left knee pain located on medial side. Started about 1 mo ago.); and B12 Injection (Last inj, 07/10/18. Too late for today?)   Back pain - last evaluation 03/2018 thought consistent with R sciatica improved temporarily with prednisone course 06/2017. Lumbar films at that time showed stable arthritis and she was referred for PT course. PT was not very effective (completed about 8 sessions). Describes R buttock pain with radiation down posterior leg. No numbness, paresthesias, or weakness of leg, no fevers, bowel/bladder incontinence. Denies recent falls, injury. She has increased walking since June.   Some ongoing constipation. Uses miralax once daily.  L>R medial knee pain ongoing over the past month.   Rash is much better - previously though scabies. Continues triamcinolone cream PRN. She had changed detergents recently - now back to using old detergent.   Relevant past medical, surgical, family and social history reviewed and updated as indicated. Interim medical history since our last visit reviewed. Allergies and medications reviewed and updated. Outpatient Medications Prior to Visit  Medication Sig Dispense Refill  . alfuzosin (UROXATRAL) 10 MG 24 hr tablet Take 1 tablet by mouth daily.    . Calcium Carbonate-Vitamin D (CALCIUM 600/VITAMIN D) 600-400 MG-UNIT chew tablet Chew 1 tablet by mouth daily.    . carbidopa-levodopa (SINEMET CR) 50-200 MG tablet TAKE 1 TABLET BY MOUTH AT BEDTIME. 90 tablet 1  . carbidopa-levodopa (SINEMET IR) 25-100 MG tablet Take 1.5  tablets by mouth 3 (three) times daily. 405 tablet 1  . cyanocobalamin (,VITAMIN B-12,) 1000 MCG/ML injection Inject 1 mL (1,000 mcg total) into the muscle every 30 (thirty) days.    Marland Kitchen gabapentin (NEURONTIN) 100 MG capsule Take 1 capsule (100 mg total) by mouth at bedtime. 90 capsule 0  . NORTHERA 300 MG CAPS TAKE 2 CAPSULES BY MOUTH THREE TIMES DAILY 540 capsule 1  . pantoprazole (PROTONIX) 40 MG tablet TAKE 1 TABLET (40 MG TOTAL) BY MOUTH DAILY. TAKE 30-60 MIN BEFORE FIRST MEAL OF THE DAY 30 tablet 2  . polyethylene glycol (MIRALAX / GLYCOLAX) packet Take 17 g by mouth as needed. 30 each 1  . pramipexole (MIRAPEX) 1 MG tablet Take 1 tablet (1 mg total) by mouth 3 (three) times daily. 270 tablet 1  . sertraline (ZOLOFT) 100 MG tablet TAKE 1 TABLET (100 MG TOTAL) BY MOUTH DAILY. 90 tablet 0  . timolol (BETIMOL) 0.5 % ophthalmic solution 1 drop 2 (two) times daily.    Marland Kitchen triamcinolone cream (KENALOG) 0.1 % Apply 1 application topically 2 (two) times daily. Apply to AA. 45 g 0  . clonazePAM (KLONOPIN) 0.5 MG tablet TAKE 1 TABLET BY MOUTH TWICE A DAY AS NEEDED 60 tablet 1  . traMADol (ULTRAM) 50 MG tablet TAKE 1 TABLET BY MOUTH 3 TIMES A DAY AS NEEDED 50 tablet 0   No facility-administered medications prior to visit.      Per HPI unless specifically indicated in ROS  section below Review of Systems     Objective:    BP 118/64 (BP Location: Left Arm, Patient Position: Sitting, Cuff Size: Normal)   Pulse 72   Temp 97.8 F (36.6 C) (Oral)   Ht 5\' 5"  (1.651 m)   Wt 162 lb (73.5 kg)   SpO2 98%   BMI 26.96 kg/m   Wt Readings from Last 3 Encounters:  08/06/18 162 lb (73.5 kg)  07/10/18 163 lb (73.9 kg)  06/24/18 167 lb (75.8 kg)    Physical Exam  Constitutional: She appears well-developed and well-nourished. No distress.  Musculoskeletal: Normal range of motion.  ++ pain midline lumbar spine + paraspinous mm tenderness bilaterally Neg SLR bilaterally. No pain with int/ext rotation at  hip. Neg FABER. Pain at R SIJ, R sciatic notch and R GTB  Knees: No deformity on inspection. Some discomfort to palpation bilateral knees without obvious effusion or swelling.  FROM in flex/extension without crepitus, discomfort present. Neg drawer test. Tender to testing mcmurray on left. No PFgrind No abnormal patellar mobility.  Tender at bilateral pes anserine bursae  Neurological: She is alert. She has normal strength. No sensory deficit.  5/5 strength BLE  Nursing note and vitals reviewed.  Results for orders placed or performed in visit on 06/24/18  CBC  Result Value Ref Range   WBC 5.9 4.0 - 10.5 K/uL   RBC 3.90 3.87 - 5.11 Mil/uL   Platelets 222.0 150.0 - 400.0 K/uL   Hemoglobin 10.3 (L) 12.0 - 15.0 g/dL   HCT 31.6 (L) 36.0 - 46.0 %   MCV 80.9 78.0 - 100.0 fl   MCHC 32.7 30.0 - 36.0 g/dL   RDW 16.2 (H) 11.5 - 15.5 %      Assessment & Plan:   Problem List Items Addressed This Visit    Right lumbar radiculopathy    No reproducible myelopathy/radiculopathy on exam today. PT not as effective as was hoped. Exam today more suspicious for degenerative disc disease changes + FM. rec trial scheduled tylenol 500mg  tid. Update with effect, if worsening consider lumbar MR.      Relevant Medications   clonazePAM (KLONOPIN) 0.5 MG tablet   Knee pain, bilateral - Primary    L>R - anticipate bursitis + degenerative arthritis changes. Provided with bursitis exercises, recommended regular use of supportive knee sleeve brace, schedule tylenol 500gm tid.       Fibromyalgia   DDD (degenerative disc disease), lumbar   Relevant Medications   traMADol (ULTRAM) 50 MG tablet   Constipation    Discussed management. rec daily miralax and add on senna or soluble fiber PRN.  Encouraged good hydration status.           Meds ordered this encounter  Medications  . clonazePAM (KLONOPIN) 0.5 MG tablet    Sig: Take 1 tablet (0.5 mg total) by mouth 2 (two) times daily as needed.     Dispense:  60 tablet    Refill:  1    Not to exceed 4 additional fills before 06/08/2018  . traMADol (ULTRAM) 50 MG tablet    Sig: TAKE 1 TABLET BY MOUTH 3 TIMES A DAY AS NEEDED    Dispense:  50 tablet    Refill:  0    Not to exceed 5 additional fills before 11/06/2018   No orders of the defined types were placed in this encounter.   Follow up plan: No follow-ups on file.  [name], MD

## 2018-08-06 NOTE — Patient Instructions (Addendum)
For constipation - use daily miralax and/or senna, increase fiber or start soluble fiber like metamucil or benefit with lots of water.  For lower back - possible degenerative arthritis causing worsening pain.  For knees - likely degenerative arthritis and bursitis of knees. Use knee brace on left for extra support when walking.  Exercises provided today. Use tylenol 500mg  twice daily to three times daily with meals. Update me with effect.

## 2018-08-08 ENCOUNTER — Ambulatory Visit: Payer: Medicare Other | Admitting: Family Medicine

## 2018-08-09 ENCOUNTER — Encounter: Payer: Medicare Other | Admitting: Gastroenterology

## 2018-08-13 ENCOUNTER — Ambulatory Visit: Payer: Medicare Other | Admitting: Family Medicine

## 2018-08-15 ENCOUNTER — Ambulatory Visit (INDEPENDENT_AMBULATORY_CARE_PROVIDER_SITE_OTHER): Payer: Medicare Other

## 2018-08-15 ENCOUNTER — Ambulatory Visit: Payer: Medicare Other

## 2018-08-15 DIAGNOSIS — Z23 Encounter for immunization: Secondary | ICD-10-CM

## 2018-08-15 DIAGNOSIS — E538 Deficiency of other specified B group vitamins: Secondary | ICD-10-CM | POA: Diagnosis not present

## 2018-08-15 MED ORDER — CYANOCOBALAMIN 1000 MCG/ML IJ SOLN
1000.0000 ug | Freq: Once | INTRAMUSCULAR | Status: AC
  Administered 2018-08-15: 1000 ug via INTRAMUSCULAR

## 2018-08-15 NOTE — Progress Notes (Signed)
Patient received her monthly B12 injection and also Flu vaccine today. Tolerated well.

## 2018-08-19 ENCOUNTER — Other Ambulatory Visit: Payer: Self-pay | Admitting: Neurology

## 2018-08-21 ENCOUNTER — Other Ambulatory Visit: Payer: Self-pay | Admitting: Family Medicine

## 2018-08-21 ENCOUNTER — Encounter: Payer: Self-pay | Admitting: Family Medicine

## 2018-08-21 DIAGNOSIS — Z1231 Encounter for screening mammogram for malignant neoplasm of breast: Secondary | ICD-10-CM

## 2018-08-26 ENCOUNTER — Other Ambulatory Visit: Payer: Self-pay | Admitting: Neurology
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
## 2018-09-10 ENCOUNTER — Encounter: Payer: Self-pay | Admitting: Neurology

## 2018-09-10 ENCOUNTER — Ambulatory Visit: Payer: Medicare Other | Admitting: Neurology

## 2018-09-10 ENCOUNTER — Encounter

## 2018-09-10 VITALS — BP 108/64 | HR 72 | Ht 65.0 in | Wt 159.0 lb

## 2018-09-10 DIAGNOSIS — G20 Parkinson's disease: Secondary | ICD-10-CM

## 2018-09-10 DIAGNOSIS — G249 Dystonia, unspecified: Secondary | ICD-10-CM

## 2018-09-10 DIAGNOSIS — E538 Deficiency of other specified B group vitamins: Secondary | ICD-10-CM

## 2018-09-12 ENCOUNTER — Other Ambulatory Visit: Payer: Self-pay | Admitting: Neurology

## 2018-09-17 ENCOUNTER — Ambulatory Visit: Payer: Medicare Other

## 2018-09-25 ENCOUNTER — Ambulatory Visit: Payer: Medicare Other

## 2018-09-30 ENCOUNTER — Encounter: Payer: Medicare Other | Admitting: Gastroenterology

## 2018-10-01 ENCOUNTER — Telehealth: Payer: Self-pay | Admitting: Neurology

## 2018-10-01 NOTE — Telephone Encounter (Signed)
Appt made with patient.  

## 2018-10-01 NOTE — Telephone Encounter (Signed)
Spoke with patient and she states for the last 4-6 weeks she has had an internal tremor (she recognizes this is a feeling of nervousness) she has been staying with her sister who's husband passed away. She also complains of random jerking of her arms and legs that sounds like myoclonus. She feels like people are staring at her. She states this started around the same time - but then states when she was in for her appt on 09/10/18 none of these symptoms were present.  No changes in medications. No recent illnesses.   Please advise.

## 2018-10-01 NOTE — Telephone Encounter (Signed)
Put her in on the 11/13 work in date and we can address then

## 2018-10-01 NOTE — Telephone Encounter (Signed)
Patient called and said that her Pramipexole 1 MG medication is making her feel nervous inside as well as a Jerking feeling. She is asking to switch to another medication? Please Call. Thanks

## 2018-10-02 ENCOUNTER — Other Ambulatory Visit: Payer: Self-pay | Admitting: Family Medicine

## 2018-10-02 DIAGNOSIS — D649 Anemia, unspecified: Secondary | ICD-10-CM

## 2018-10-02 DIAGNOSIS — E785 Hyperlipidemia, unspecified: Secondary | ICD-10-CM

## 2018-10-02 DIAGNOSIS — E559 Vitamin D deficiency, unspecified: Secondary | ICD-10-CM

## 2018-10-02 DIAGNOSIS — E538 Deficiency of other specified B group vitamins: Secondary | ICD-10-CM

## 2018-10-03 ENCOUNTER — Ambulatory Visit: Payer: Medicare Other

## 2018-10-04 ENCOUNTER — Ambulatory Visit (INDEPENDENT_AMBULATORY_CARE_PROVIDER_SITE_OTHER): Payer: Medicare Other

## 2018-10-04 ENCOUNTER — Ambulatory Visit: Payer: Medicare Other

## 2018-10-04 VITALS — BP 110/64 | HR 72 | Temp 97.9°F | Ht 66.0 in | Wt 160.2 lb

## 2018-10-04 DIAGNOSIS — E559 Vitamin D deficiency, unspecified: Secondary | ICD-10-CM | POA: Diagnosis not present

## 2018-10-04 DIAGNOSIS — E785 Hyperlipidemia, unspecified: Secondary | ICD-10-CM

## 2018-10-04 DIAGNOSIS — D649 Anemia, unspecified: Secondary | ICD-10-CM | POA: Diagnosis not present

## 2018-10-04 DIAGNOSIS — Z0000 Encounter for general adult medical examination without abnormal findings: Secondary | ICD-10-CM | POA: Diagnosis not present

## 2018-10-04 DIAGNOSIS — E538 Deficiency of other specified B group vitamins: Secondary | ICD-10-CM | POA: Diagnosis not present

## 2018-10-04 LAB — TSH: TSH: 0.49 u[IU]/mL (ref 0.35–4.50)

## 2018-10-04 LAB — COMPREHENSIVE METABOLIC PANEL
ALBUMIN: 3.9 g/dL (ref 3.5–5.2)
ALK PHOS: 62 U/L (ref 39–117)
ALT: 3 U/L (ref 0–35)
AST: 11 U/L (ref 0–37)
BUN: 16 mg/dL (ref 6–23)
CO2: 28 mEq/L (ref 19–32)
Calcium: 9.2 mg/dL (ref 8.4–10.5)
Chloride: 105 mEq/L (ref 96–112)
Creatinine, Ser: 0.76 mg/dL (ref 0.40–1.20)
GFR: 79.9 mL/min (ref >60.00)
Glucose, Bld: 96 mg/dL (ref 70–99)
POTASSIUM: 4.3 meq/L (ref 3.5–5.1)
SODIUM: 140 meq/L (ref 135–145)
TOTAL PROTEIN: 6.3 g/dL (ref 6.0–8.3)
Total Bilirubin: 0.4 mg/dL (ref 0.2–1.2)

## 2018-10-04 LAB — CBC WITH DIFFERENTIAL/PLATELET
BASOS PCT: 0.8 % (ref 0.0–3.0)
Basophils Absolute: 0 10*3/uL (ref 0.0–0.1)
EOS ABS: 0.1 10*3/uL (ref 0.0–0.7)
Eosinophils Relative: 3.3 % (ref 0.0–5.0)
HCT: 33.4 % — ABNORMAL LOW (ref 36.0–46.0)
HEMOGLOBIN: 10.9 g/dL — AB (ref 12.0–15.0)
Lymphocytes Relative: 38.1 % (ref 12.0–46.0)
Lymphs Abs: 1.5 10*3/uL (ref 0.7–4.0)
MCHC: 32.6 g/dL (ref 30.0–36.0)
MCV: 81.4 fl (ref 78.0–100.0)
MONO ABS: 0.2 10*3/uL (ref 0.1–1.0)
Monocytes Relative: 6.1 % (ref 3.0–12.0)
Neutro Abs: 2.1 10*3/uL (ref 1.4–7.7)
Neutrophils Relative %: 51.7 % (ref 43.0–77.0)
PLATELETS: 240 10*3/uL (ref 150.0–400.0)
RBC: 4.1 Mil/uL (ref 3.87–5.11)
RDW: 17 % — AB (ref 11.5–15.5)
WBC: 4 10*3/uL (ref 4.0–10.5)

## 2018-10-04 LAB — LIPID PANEL
CHOLESTEROL: 168 mg/dL (ref 0–200)
HDL: 56.1 mg/dL (ref >39.00)
LDL Cholesterol: 95 mg/dL (ref 0–99)
NONHDL: 111.74
Total CHOL/HDL Ratio: 3
Triglycerides: 82 mg/dL (ref 0.0–149.0)
VLDL: 16.4 mg/dL (ref 0.0–40.0)

## 2018-10-04 LAB — IBC PANEL
IRON: 49 ug/dL (ref 42–145)
Saturation Ratios: 11.8 % — ABNORMAL LOW (ref 20.0–50.0)
TRANSFERRIN: 296 mg/dL (ref 212.0–360.0)

## 2018-10-04 LAB — VITAMIN B12: VITAMIN B 12: 556 pg/mL (ref 211–911)

## 2018-10-04 LAB — VITAMIN D 25 HYDROXY (VIT D DEFICIENCY, FRACTURES): VITD: 29.86 ng/mL — ABNORMAL LOW (ref 30.00–100.00)

## 2018-10-04 NOTE — Progress Notes (Signed)
Subjective:   [name] is a 72 y.o. female who presents for Medicare Annual (Subsequent) preventive examination.  Review of Systems:  N/A Cardiac Risk Factors include: advanced age (>33men, >21 women)     Objective:     Vitals: BP 110/64 (BP Location: Right Arm, Patient Position: Sitting, Cuff Size: Normal)   Pulse 72   Temp 97.9 F (36.6 C) (Oral)   Ht 5\' 6"  (1.676 m) Comment: shoes  Wt 160 lb 4 oz (72.7 kg)   SpO2 98%   BMI 25.87 kg/m   Body mass index is 25.87 kg/m.  Advanced Directives 10/04/2018 04/29/2018 01/23/2018 12/04/2017 09/18/2017 03/29/2017 08/03/2016  Does Patient Have a Medical Advance Directive? No No No No No No No  Would patient like information on creating a medical advance directive? No - Patient declined No - Patient declined - - - - No - patient declined information    Tobacco Social History   Tobacco Use  Smoking Status Never Smoker  Smokeless Tobacco Never Used     Counseling given: No   Clinical Intake:  Pre-visit preparation completed: Yes  Pain : No/denies pain Pain Score: 6      Nutritional Status: BMI 25 -29 Overweight Nutritional Risks: None Diabetes: No  How often do you need to have someone help you when you read instructions, pamphlets, or other written materials from your doctor or pharmacy?: 1 - Never What is the last grade level you completed in school?: 10th grade  Interpreter Needed?: No  Comments: pt is a widow and lives alone Information entered by :: LPinson, LPN  Past Medical History:  Diagnosis Date  . ANEMIA-NOS 09/25/2007  . Anxiety   . Arthritis   . B12 DEFICIENCY 05/03/2007  . CAD (coronary artery disease) 01/2010   MI, Nishan  . Cardiomyopathy ([place]) 02/08/2010   H/o this 2012 after urosepsis, no recurrence.   . Depression   . FIBROMYALGIA 05/03/2007  . GERD 02/22/2010  . Glaucoma 02/2013   [facility]  . History of CHF (congestive heart failure) 01/2010  . History of colon polyps 2004  .  HYPERLIPIDEMIA 12/19/2007  . HYPOTENSION, ORTHOSTATIC 12/06/2008  . Interstitial cystitis    [place] now Dr Amalia Hailey  . Lupus (systemic lupus erythematosus) ([place]) 02/08/2010  . MCTD (mixed connective tissue disease) ([place]) 02/08/2010  . OSTEOPOROSIS 08/2009   bisphosphonate on hold 2/2 dysphagia, on reclast done in August each year  . Parkinson's disease ([place]) 08/25/2015   Dx Dr Carles Collet 07/2015   . 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 Takotsubo cardiomyopathy 2008   due to E coli urosepsis   Past Surgical History:  Procedure Laterality Date  . ABDOMINAL HYSTERECTOMY  1970s   IUD infection - first partial then with oophorectomy (cysts), complication - low blood pressure  . CATARACT EXTRACTION Bilateral   . CHOLECYSTECTOMY     complication - low blood pressure  . COLONOSCOPY  06/2008   h/o polyps but latest WNL, rec rpt 10 yrs Olevia Perches)  . CYSTOSCOPY  12/2013   abx treatment for recurrent cystitis  . DEXA  04/2013   T -2.9 @ femur, -1.6 @ spine  . DEXA  04/2017   T -2.9 hip, -0.7 spine  . ESOPHAGOGASTRODUODENOSCOPY  12/2017   WNL, regardless esophagus dilated, small [facility])   Family History  Problem Relation Age of Onset  . Heart attack Father   . Diabetes Father   . Prostate cancer Father   .  Esophageal cancer Mother   . Lung cancer Brother   . Breast cancer Sister   . Ovarian cancer Sister   . Lung cancer Brother   . CAD Brother   . Uterine cancer Sister   . Clotting disorder Son    Social History   Socioeconomic History  . Marital status: Married    Spouse name: Not on file  . Number of children: 4  . Years of education: Not on file  . Highest education level: Not on file  Occupational History  . Occupation: retired  Scientific laboratory technician  . Financial resource strain: Not on file  . Food insecurity:    Worry: Not on file    Inability: Not on file  . Transportation needs:    Medical: Not on file    Non-medical: Not on file  Tobacco Use  . Smoking  status: Never Smoker  . Smokeless tobacco: Never Used  Substance and Sexual Activity  . Alcohol use: No  . Drug use: No  . Sexual activity: Never  Lifestyle  . Physical activity:    Days per week: Not on file    Minutes per session: Not on file  . Stress: Not on file  Relationships  . Social connections:    Talks on phone: Not on file    Gets together: Not on file    Attends religious service: Not on file    Active member of club or organization: Not on file    Attends meetings of clubs or organizations: Not on file    Relationship status: Not on file  Other Topics Concern  . Not on file  Social History Narrative   Widow - husband [name]) passed away 03-08-2013.     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 alfuzosin (UROXATRAL) 10 MG 24 hr tablet Take 1 tablet by mouth daily.  . Calcium Carbonate-Vitamin D (CALCIUM 600/VITAMIN D) 600-400 MG-UNIT chew tablet Chew 1 tablet by mouth daily.  . carbidopa-levodopa (SINEMET CR) 50-200 MG tablet TAKE 1 TABLET BY MOUTH AT BEDTIME.  . carbidopa-levodopa (SINEMET IR) 25-100 MG tablet TAKE 1.5 TABLETS BY MOUTH 3 (THREE) TIMES DAILY  . clonazePAM (KLONOPIN) 0.5 MG tablet Take 1 tablet (0.5 mg total) by mouth 2 (two) times daily as needed.  . cyanocobalamin (,VITAMIN B-12,) 1000 MCG/ML injection Inject 1 mL (1,000 mcg total) into the muscle every 30 (thirty) days.  Marland Kitchen gabapentin (NEURONTIN) 100 MG capsule Take 1 capsule (100 mg total) by mouth at bedtime.  . pantoprazole (PROTONIX) 40 MG tablet TAKE 1 TABLET (40 MG TOTAL) BY MOUTH DAILY. TAKE 30-60 MIN BEFORE FIRST MEAL OF THE DAY  . polyethylene glycol (MIRALAX / GLYCOLAX) packet Take 17 g by mouth as needed.  . pramipexole (MIRAPEX) 1 MG tablet Take 1 tablet (1 mg total)  by mouth 3 (three) times daily.  . sertraline (ZOLOFT) 100 MG tablet TAKE 1 TABLET (100 MG TOTAL) BY MOUTH DAILY.  Marland Kitchen timolol (BETIMOL) 0.5 % ophthalmic solution 1 drop 2 (two) times daily.  . traMADol (ULTRAM) 50 MG tablet TAKE 1 TABLET BY MOUTH 3 TIMES A DAY AS NEEDED  . triamcinolone cream (KENALOG) 0.1 % Apply 1 application topically 2 (two) times daily. Apply to AA.   No  facility-administered encounter medications on file as of 10/04/2018.     Activities of Daily Living In your present state of health, do you have any difficulty performing the following activities: 10/04/2018  Hearing? N  Vision? N  Difficulty concentrating or making decisions? N  Walking or climbing stairs? N  Dressing or bathing? N  Doing errands, shopping? N  Preparing Food and eating ? N  Using the Toilet? N  In the past six months, have you accidently leaked urine? Y  Do you have problems with loss of bowel control? N  Managing your Medications? N  Managing your Finances? N  Housekeeping or managing your Housekeeping? N  Some recent data might be hidden    Patient Care Team: [name], MD as PCP - General (Family Medicine) Domingo Pulse, MD (Urology) [name], Eustace Quail, DO as Consulting Physician (Neurology) Karren Burly Deirdre Peer, MD as Referring Physician (Ophthalmology)    Assessment:   This is a routine wellness examination for [name].   Hearing Screening   125Hz  250Hz  500Hz  1000Hz  2000Hz  3000Hz  4000Hz  6000Hz  8000Hz   Right ear:   40 40 40  40    Left ear:   40 40 40  40    Vision Screening Comments: Vision exam every 4 mths due dx of glacuoma; cataract surgery in 2019 with Dr. Sabra Heck    Exercise Activities and Dietary recommendations Current Exercise Habits: Home exercise routine, Type of exercise: walking, Time (Minutes): 35, Frequency (Times/Week): 7, Weekly Exercise (Minutes/Week): 245, Intensity: Moderate, Exercise limited by: None identified  Goals    . Increase physical  activity     As weather permits, I will continue to walk at least 30 min daily.        Fall Risk Fall Risk  10/04/2018 09/10/2018 02/14/2018 11/13/2017 09/18/2017  Falls in the past year? 0 No Yes No No  Comment - - - - -  Number falls in past yr: - - 1 - -  Injury with Fall? - - No - -  Follow up - - Falls evaluation completed - -   Depression Screen PHQ 2/9 Scores 10/04/2018 09/18/2017 11/14/2016 08/03/2016  PHQ - 2 Score 0 2 0 0  PHQ- 9 Score 3 7 - -     Cognitive Function MMSE - Mini Mental State Exam 10/04/2018 09/18/2017 08/03/2016  Orientation to time 5 5 5   Orientation to Place 5 5 5   Registration 3 3 3   Attention/ Calculation 0 0 0  Recall 3 3 3   Language- name 2 objects 0 0 0  Language- repeat 1 1 1   Language- follow 3 step command 3 3 3   Language- read & follow direction 0 0 0  Write a sentence 0 0 0  Copy design 0 0 0  Total score 20 20 20      PLEASE NOTE: A Mini-Cog screen was completed. Maximum score is 20. A value of 0 denotes this part of Folstein MMSE was not completed or the patient failed this part of the Mini-Cog screening.   Mini-Cog Screening Orientation to Time - Max 5 pts Orientation to Place - Max 5 pts Registration - Max 3 pts Recall - Max 3 pts Language Repeat - Max 1 pts Language Follow 3 Step Command - Max 3 pts     Immunization History  Administered Date(s) Administered  . Influenza Split 08/18/2011, 10/11/2012  . Influenza Whole 09/25/2007, 09/08/2008, 08/23/2009, 09/02/2010  . Influenza,inj,Quad PF,6+ Mos 09/02/2013, 08/13/2014, 10/28/2015, 11/14/2016, 09/18/2017, 08/15/2018  . Pneumococcal Conjugate-13  05/21/2014  . Pneumococcal Polysaccharide-23 05/26/2015  . Tdap 08/18/2011  . Zoster 10/12/2010    Screening Tests Health Maintenance  Topic Date Due  . MAMMOGRAM  11/04/2018 (Originally 05/24/2018)  . COLONOSCOPY  06/28/2019 (Originally 06/30/2018)  . DTaP/Tdap/Td (2 - Td) 08/17/2021  . TETANUS/TDAP  08/17/2021  . INFLUENZA VACCINE   Completed  . DEXA SCAN  Completed  . Hepatitis C Screening  Completed  . PNA vac Low Risk Adult  Completed      Plan:     I have personally reviewed, addressed, and noted the following in the patient's chart:  A. Medical and social history B. Use of alcohol, tobacco or illicit drugs  C. Current medications and supplements D. Functional ability and status E.  Nutritional status F.  Physical activity G. Advance directives H. List of other physicians I.  Hospitalizations, surgeries, and ER visits in previous 12 months J.  [place] to include hearing, vision, cognitive, depression L. Referrals and appointments - none  In addition, I have reviewed and discussed with patient certain preventive protocols, quality metrics, and best practice recommendations. A written personalized care plan for preventive services as well as general preventive health recommendations were provided to patient.  See attached scanned questionnaire for additional information.   Signed,   Lindell Noe, MHA, BS, LPN Health Coach

## 2018-10-04 NOTE — Progress Notes (Signed)
PCP notes:   Health maintenance:  Mammogram - future appt scheduled Colonoscopy - addressed  Abnormal screenings:   None  Patient concerns:   None  Nurse concerns:  None  Next PCP appt:   10/10/18 @ 1030

## 2018-10-04 NOTE — Patient Instructions (Signed)
[name] , Thank you for taking time to come for your Medicare Wellness Visit. I appreciate your ongoing commitment to your health goals. Please review the following plan we discussed and let me know if I can assist you in the future.   These are the goals we discussed: Goals    . Increase physical activity     As weather permits, I will continue to walk at least 30 min daily.        This is a list of the screening recommended for you and due dates:  Health Maintenance  Topic Date Due  . Mammogram  11/04/2018*  . Colon Cancer Screening  06/28/2019*  . DTaP/Tdap/Td vaccine (2 - Td) 08/17/2021  . Tetanus Vaccine  08/17/2021  . Flu Shot  Completed  . DEXA scan (bone density measurement)  Completed  .  Hepatitis C: One time screening is recommended by Center for Disease Control  (CDC) for  adults born from [birth year] through 1963.   Completed  . Pneumonia vaccines  Completed  *Topic was postponed. The date shown is not the original due date.   Preventive Care for Adults  A healthy lifestyle and preventive care can promote health and wellness. Preventive health guidelines for adults include the following key practices.  . A routine yearly physical is a good way to check with your health care provider about your health and preventive screening. It is a chance to share any concerns and updates on your health and to receive a thorough exam.  . Visit your dentist for a routine exam and preventive care every 6 months. Brush your teeth twice a day and floss once a day. Good oral hygiene prevents tooth decay and gum disease.  . The frequency of eye exams is based on your age, health, family medical history, use  of contact lenses, and other factors. Follow your health care provider's recommendations for frequency of eye exams.  . Eat a healthy diet. Foods like vegetables, fruits, whole grains, low-fat dairy products, and lean protein foods contain the nutrients you need without too many calories.  Decrease your intake of foods high in solid fats, added sugars, and salt. Eat the right amount of calories for you. Get information about a proper diet from your health care provider, if necessary.  . Regular physical exercise is one of the most important things you can do for your health. Most adults should get at least 150 minutes of moderate-intensity exercise (any activity that increases your heart rate and causes you to sweat) each week. In addition, most adults need muscle-strengthening exercises on 2 or more days a week.  Silver Sneakers may be a benefit available to you. To determine eligibility, you may visit the website: www.silversneakers.com or contact program at 7030094024 Mon-Fri between 8AM-8PM.   . Maintain a healthy weight. The body mass index (BMI) is a screening tool to identify possible weight problems. It provides an estimate of body fat based on height and weight. Your health care provider can find your BMI and can help you achieve or maintain a healthy weight.   For adults 20 years and older: ? A BMI below 18.5 is considered underweight. ? A BMI of 18.5 to 24.9 is normal. ? A BMI of 25 to 29.9 is considered overweight. ? A BMI of 30 and above is considered obese.   . Maintain normal blood lipids and cholesterol levels by exercising and minimizing your intake of saturated fat. Eat a balanced diet with plenty of  fruit and vegetables. Blood tests for lipids and cholesterol should begin at age 93 and be repeated every 5 years. If your lipid or cholesterol levels are high, you are over 50, or you are at high risk for heart disease, you may need your cholesterol levels checked more frequently. Ongoing high lipid and cholesterol levels should be treated with medicines if diet and exercise are not working.  . If you smoke, find out from your health care provider how to quit. If you do not use tobacco, please do not start.  . If you choose to drink alcohol, please do not consume  more than 2 drinks per day. One drink is considered to be 12 ounces (355 mL) of beer, 5 ounces (148 mL) of wine, or 1.5 ounces (44 mL) of liquor.  . If you are 74-37 years old, ask your health care provider if you should take aspirin to prevent strokes.  . Use sunscreen. Apply sunscreen liberally and repeatedly throughout the day. You should seek shade when your shadow is shorter than you. Protect yourself by wearing long sleeves, pants, a wide-brimmed hat, and sunglasses year round, whenever you are outdoors.  . Once a month, do a whole body skin exam, using a mirror to look at the skin on your back. Tell your health care provider of new moles, moles that have irregular borders, moles that are larger than a pencil eraser, or moles that have changed in shape or color.
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
## 2018-10-09 ENCOUNTER — Ambulatory Visit: Payer: Medicare Other | Admitting: Neurology

## 2018-10-09 ENCOUNTER — Encounter: Payer: Self-pay | Admitting: Neurology

## 2018-10-09 VITALS — BP 104/60 | HR 72 | Ht 65.0 in | Wt 161.2 lb

## 2018-10-09 DIAGNOSIS — G20 Parkinson's disease: Secondary | ICD-10-CM

## 2018-10-09 DIAGNOSIS — G249 Dystonia, unspecified: Secondary | ICD-10-CM

## 2018-10-09 MED ORDER — AMANTADINE HCL 100 MG PO CAPS: 100.0000 mg | ORAL_CAPSULE | Freq: Two times a day (BID) | ORAL | 1 refills | Status: DC

## 2018-10-09 NOTE — Patient Instructions (Signed)
Start amantadine 100 mg - 1 tablet twice per day with morning and evening pill of the yellow pill of carbidopa/levodopa 25/100  Community Parkinson's Exercise Programs   Parkinson's Wellness Recovery Exercise Programs:   PWR! Moves PD Exercise Class:  This is a therapist-led exercise class for people with Parkinson's disease in the [place] community. It consists of a one-hour exercise class each week. Classes are offered in eight-week sessions, and the cost per session is $80. Class size is limited to a maximum of 20 participants. Participant criteria includes: Participant must be able to get up and down from the floor with minimal to no assistance, have had 0-1 falls in the past 6 months, and have completed physical or occupational therapy at [facility] within the past year.  To find out more about session dates, questions, or to register, please contact Mady Haagensen, Physical Therapist, or Nita Sells, Physical Brewing technologist, at [facility] at [phone].  PWR! Circuit Class:  This is a therapist-led exercise class with intervals of circuit activities incorporating PWR! Moves into functional activities. It consists of one 45-minute exercise class per week. Classes are offered in eight-week sessions, and the cost per session is $120. Class size is limited to a maximum of eight participants to allow for hands-on instruction. Participant criteria: class is ideal for people with Parkinson's disease who have completed PWR! Moves Exercise Class or who are currently independently exercising and want to be challenged, must be able to walk independently with 0-1 falls in the past 6 months, able to get up and down from the floor independently, able to sit to stand independently, and able to jog 20 feet.   To find out more about session dates, questions, or to register, please contact Mady Haagensen, Physical Therapist, or Nita Sells,  Physical Brewing technologist, at [facility] at [phone].   [employer] Parkinson's Cycle:   Parkinson's Cycle Class at [facility] This is an ongoing class on Monday and Thursday mornings at 10:45 a.m. A healthcare provider referral is required to enroll. This class is FREE to participants, and you do not have to be a member of the [employer] to enroll. Contact Beth at [phone] or [email or web address]. Parkinson's Cycle Class at [facility] Ongoing Class Monday, Wednesday, and Friday mornings at 9:00 a.m. A healthcare provider referral is required to enroll. This class is FREE to participants, and you do not have to be a member of the [employer] to enroll. Contact Marlee at [phone] or [email or web address]. Parkinson's Cycle Class at [facility] Ongoing Class every Friday mornings at 12 p.m.  A healthcare provider referral is required to enroll. This class is FREE to participants, and you do not have to be a member of the [employer] to enroll. Contact [phone].  Parkinson's Cycle Class at [facility] Ongoing Class every Monday at 12pm.  A healthcare provider referral is required to enroll. This class is FREE to participants, and you do not have to be a member of the [employer] to enroll. Contact Almyra Free at [phone] or  [email or web address].   [employer]:  [employer]  Classes are offered Mondays at 5:15 p.m. and Tuesdays and Thursdays at 12 p.m. at [employer]. For more information, contact [phone] or visit [email or web address] or [email or web address]. [employer] [place] Classes are offered Monday, Wednesday, and Friday from 9:30 a.m. - 11:00 a.m. For more information, contact [phone] or [phone] or email [email or web address] or visit  [email or web address] or [email or web address]. [employer]  (classes are offered at 2 locations) . [facility] [employer] in [place] (for more information, contact [place] at [phone] or email [email or web address] . Cathren Laine at [facility] (class is open to the public -- for more information, contact Clabe Seal at [phone] or email [email or web address]) [place] are held at [facility] in [place], [place]. For more information, call Dr. Bing Plume at [phone] or [email or web address].   Personal Training for Parkinson's:   [employer] Offers certified personal training to customize a program to meet your exercise needs to address Parkinson's disease. For more information, contact [phone] or visit [email or web address].  Community Dance for Parkinson's:   Community dance class for people with Parkinson's Disease Wednesdays at 9 a.m. [employer] [place], [place] [address] Please contact Eliberto Ivory [phone] for more information  Scholarships Available for Fitness Programs:  The [place] for [employer] is a non-profit 501(C)3 organization run by volunteers, whose mission is to strive to empower those living with Parkinson's Disease (PD), Progressive Supra-Nuclear Palsy (PSP) and Multiple System Atrophy ([place]).  Through financial support, recipients benefit from individual and group programs. [phone] [email or web address]

## 2018-10-10 ENCOUNTER — Encounter: Payer: Self-pay | Admitting: Family Medicine

## 2018-10-10 ENCOUNTER — Ambulatory Visit (INDEPENDENT_AMBULATORY_CARE_PROVIDER_SITE_OTHER): Payer: Medicare Other | Admitting: Family Medicine

## 2018-10-10 VITALS — BP 116/70 | HR 61 | Temp 97.6°F | Ht 66.0 in | Wt 160.5 lb

## 2018-10-10 DIAGNOSIS — F411 Generalized anxiety disorder: Secondary | ICD-10-CM

## 2018-10-10 DIAGNOSIS — M810 Age-related osteoporosis without current pathological fracture: Secondary | ICD-10-CM

## 2018-10-10 DIAGNOSIS — Z0000 Encounter for general adult medical examination without abnormal findings: Secondary | ICD-10-CM | POA: Diagnosis not present

## 2018-10-10 DIAGNOSIS — G20 Parkinson's disease: Secondary | ICD-10-CM | POA: Diagnosis not present

## 2018-10-10 DIAGNOSIS — E785 Hyperlipidemia, unspecified: Secondary | ICD-10-CM

## 2018-10-10 DIAGNOSIS — E559 Vitamin D deficiency, unspecified: Secondary | ICD-10-CM

## 2018-10-10 DIAGNOSIS — M797 Fibromyalgia: Secondary | ICD-10-CM

## 2018-10-10 DIAGNOSIS — M5416 Radiculopathy, lumbar region: Secondary | ICD-10-CM | POA: Diagnosis not present

## 2018-10-10 DIAGNOSIS — D649 Anemia, unspecified: Secondary | ICD-10-CM

## 2018-10-10 DIAGNOSIS — K219 Gastro-esophageal reflux disease without esophagitis: Secondary | ICD-10-CM

## 2018-10-10 DIAGNOSIS — F321 Major depressive disorder, single episode, moderate: Secondary | ICD-10-CM

## 2018-10-10 DIAGNOSIS — E538 Deficiency of other specified B group vitamins: Secondary | ICD-10-CM

## 2018-10-10 MED ORDER — TRAMADOL HCL 50 MG PO TABS: ORAL_TABLET | ORAL | 0 refills | Status: DC

## 2018-10-10 MED ORDER — VITAMIN D3 25 MCG (1000 UT) PO CAPS: 1.0000 | ORAL_CAPSULE | Freq: Every day | ORAL | Status: AC

## 2018-10-10 NOTE — Assessment & Plan Note (Signed)
Reviewed latest DEXA. She is regularly walking now! Continue Q40mo prolia.

## 2018-10-10 NOTE — Assessment & Plan Note (Signed)
Has not been taking PPI - denies significant reflux concerns.

## 2018-10-10 NOTE — Assessment & Plan Note (Signed)
Chronic, stable off meds The 10-year ASCVD risk score Mikey Bussing DC Jr., et al., 2013) is: 7.5%   Values used to calculate the score:     Age: 72 years     Sex: Female     Is Non-Hispanic African American: No     Diabetic: No     Tobacco smoker: No     Systolic Blood Pressure: 826 mmHg     Is BP treated: No     HDL Cholesterol: 56.1 mg/dL     Total Cholesterol: 168 mg/dL

## 2018-10-10 NOTE — Assessment & Plan Note (Signed)
Off iron. Will await colonoscopy.

## 2018-10-10 NOTE — Assessment & Plan Note (Signed)
Ongoing trouble. Better with activity, worse with rest. She desires to undergo colonoscopy then may consider lumbar MRI and ESI

## 2018-10-10 NOTE — Assessment & Plan Note (Signed)
Appreciate excellent care by Dr [name]. She has recently started amantadine bid for med related dyskinesia.

## 2018-10-10 NOTE — Assessment & Plan Note (Signed)
Preventative protocols reviewed and updated unless pt declined. Discussed healthy diet and lifestyle.  

## 2018-10-10 NOTE — Assessment & Plan Note (Signed)
rec start vit d 1000 IU daily.

## 2018-10-10 NOTE — Progress Notes (Signed)
BP 116/70 (BP Location: Left Arm, Patient Position: Sitting, Cuff Size: Normal)   Pulse 61   Temp 97.6 F (36.4 C) (Oral)   Ht 5\' 6"  (1.676 m)   Wt 160 lb 8 oz (72.8 kg)   SpO2 97%   BMI 25.91 kg/m    CC: CPE Subjective:    Patient ID: [name], female    DOB: 1946/02/17, 72 y.o.   MRN: [id number]  HPI: [name] is a 72 y.o. female presenting on 10/10/2018 for Annual Exam (Pt 2.)   Saw Katha Cabal last week for medicare wellness visit. Note reviewed.   Saw Dr [name] yesterday.  BIL recently passed away - she stayed with sister.  Ongoing back pain worse with sitting/rest, improved with activity. No numbness/weakness of legs. Scheduled tylenol 500mg  tid ineffective. Ongoing R leg symptoms.   Preventative: COLONOSCOPY Date: 06/2008 h/o polyps but latest WNL, rec rpt 10 yrs Olevia Perches).Upcoming appt with GI.  Mammogram 04/2017 WNL.Gets Q2 yrs, schedules on her own.  Well woman - s/p hysterectomyandoophorectomy.  DEXA Date: 04/2013 T -2.9atfemur, -1.6 atspine.Last reclast8/2015. 04/2017 DEXA T score -2.9 hip, stable osteoporosis, continue Q15mo prolia.  Fluyearly Prevnar 04/2014, pneumovax6/2016 Tdap 2012  Zostavax 2011  shingrix - discussed Advanced directives: Discussing with sonScottyfrom [place] who would be HCPOA.Has packet at home. Seat belt use discussed. Sunscreen use discussed. No changing moles on skin. Non smoker Alcohol - none Dentist doesn't see regularly - has dentures Eye exam yearly (cataracts 3 mo ago)  Lives with daughter, 1 dog. Widow of husband [name]) 2015. 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  Relevant past medical, surgical, family and social history reviewed and updated as indicated. Interim medical history since our last visit  reviewed. Allergies and medications reviewed and updated. Outpatient Medications Prior to Visit  Medication Sig Dispense Refill  . alfuzosin (UROXATRAL) 10 MG 24 hr tablet Take 1 tablet by mouth daily.    Marland Kitchen amantadine (SYMMETREL) 100 MG capsule Take 1 capsule (100 mg total) by mouth 2 (two) times daily. 180 capsule 1  . Calcium Carbonate-Vitamin D (CALCIUM 600/VITAMIN D) 600-400 MG-UNIT chew tablet Chew 1 tablet by mouth daily.    . carbidopa-levodopa (SINEMET CR) 50-200 MG tablet TAKE 1 TABLET BY MOUTH AT BEDTIME. 90 tablet 0  . carbidopa-levodopa (SINEMET IR) 25-100 MG tablet TAKE 1.5 TABLETS BY MOUTH 3 (THREE) TIMES DAILY 405 tablet 1  . clonazePAM (KLONOPIN) 0.5 MG tablet Take 1 tablet (0.5 mg total) by mouth 2 (two) times daily as needed. 60 tablet 1  . cyanocobalamin (,VITAMIN B-12,) 1000 MCG/ML injection Inject 1 mL (1,000 mcg total) into the muscle every 30 (thirty) days.    Marland Kitchen gabapentin (NEURONTIN) 100 MG capsule Take 1 capsule (100 mg total) by mouth at bedtime. 90 capsule 0  . latanoprost (XALATAN) 0.005 % ophthalmic solution     . pantoprazole (PROTONIX) 40 MG tablet TAKE 1 TABLET (40 MG TOTAL) BY MOUTH DAILY. TAKE 30-60 MIN BEFORE FIRST MEAL OF THE DAY 30 tablet 2  . polyethylene glycol (MIRALAX / GLYCOLAX) packet Take 17 g by mouth as needed. 30 each 1  . pramipexole (MIRAPEX) 1 MG tablet Take 1 tablet (1 mg total) by mouth 3 (three) times daily. 270 tablet 1  . sertraline (ZOLOFT) 100 MG tablet TAKE 1 TABLET (100 MG TOTAL)  BY MOUTH DAILY. 90 tablet 1  . timolol (BETIMOL) 0.5 % ophthalmic solution 1 drop 2 (two) times daily.    Marland Kitchen triamcinolone cream (KENALOG) 0.1 % Apply 1 application topically 2 (two) times daily. Apply to AA. 45 g 0  . traMADol (ULTRAM) 50 MG tablet TAKE 1 TABLET BY MOUTH 3 TIMES A DAY AS NEEDED 50 tablet 0   No facility-administered medications prior to visit.      Per HPI unless specifically indicated in ROS section below Review of Systems  Constitutional:  Negative for activity change, appetite change, chills, fatigue, fever and unexpected weight change.  HENT: Negative for hearing loss.   Eyes: Negative for visual disturbance.  Respiratory: Negative for cough, chest tightness, shortness of breath and wheezing.   Cardiovascular: Negative for chest pain, palpitations and leg swelling.  Gastrointestinal: Negative for abdominal distention, abdominal pain, blood in stool, constipation, diarrhea, nausea and vomiting.  Genitourinary: Negative for difficulty urinating and hematuria.  Musculoskeletal: Negative for arthralgias, myalgias and neck pain.  Skin: Negative for rash.  Neurological: Negative for dizziness, seizures, syncope and headaches.  Hematological: Negative for adenopathy. Bruises/bleeds easily.  Psychiatric/Behavioral: Negative for dysphoric mood. The patient is not nervous/anxious.        Objective:    BP 116/70 (BP Location: Left Arm, Patient Position: Sitting, Cuff Size: Normal)   Pulse 61   Temp 97.6 F (36.4 C) (Oral)   Ht 5\' 6"  (1.676 m)   Wt 160 lb 8 oz (72.8 kg)   SpO2 97%   BMI 25.91 kg/m   Wt Readings from Last 3 Encounters:  10/10/18 160 lb 8 oz (72.8 kg)  10/09/18 161 lb 4 oz (73.1 kg)  10/04/18 160 lb 4 oz (72.7 kg)    Physical Exam  Constitutional: She is oriented to person, place, and time. She appears well-developed and well-nourished. No distress.  HENT:  Head: Normocephalic and atraumatic.  Right Ear: Hearing, tympanic membrane, external ear and ear canal normal.  Left Ear: Hearing, tympanic membrane, external ear and ear canal normal.  Nose: Nose normal.  Mouth/Throat: Uvula is midline, oropharynx is clear and moist and mucous membranes are normal. No oropharyngeal exudate, posterior oropharyngeal edema or posterior oropharyngeal erythema.  Eyes: Pupils are equal, round, and reactive to light. Conjunctivae and EOM are normal. No scleral icterus.  Neck: Normal range of motion. Neck supple.   Cardiovascular: Normal rate, regular rhythm, normal heart sounds and intact distal pulses.  No murmur heard. Pulses:      Radial pulses are 2+ on the right side, and 2+ on the left side.  Pulmonary/Chest: Effort normal and breath sounds normal. No respiratory distress. She has no wheezes. She has no rales.  Abdominal: Soft. Bowel sounds are normal. She exhibits no distension and no mass. There is no tenderness. There is no rebound and no guarding.  Musculoskeletal: Normal range of motion. She exhibits no edema.  Lymphadenopathy:    She has no cervical adenopathy.  Neurological: She is alert and oriented to person, place, and time.  CN grossly intact, station and gait intact Dyskinesias noted   Skin: Skin is warm and dry. No rash noted.  Psychiatric: She has a normal mood and affect. Her behavior is normal. Judgment and thought content normal.  Nursing note and vitals reviewed.  Results for orders placed or performed in visit on 10/04/18  TSH  Result Value Ref Range   TSH 0.49 0.35 - 4.50 uIU/mL  CBC with Differential/Platelet  Result Value Ref Range  WBC 4.0 4.0 - 10.5 K/uL   RBC 4.10 3.87 - 5.11 Mil/uL   Hemoglobin 10.9 (L) 12.0 - 15.0 g/dL   HCT 33.4 (L) 36.0 - 46.0 %   MCV 81.4 78.0 - 100.0 fl   MCHC 32.6 30.0 - 36.0 g/dL   RDW 17.0 (H) 11.5 - 15.5 %   Platelets 240.0 150.0 - 400.0 K/uL   Neutrophils Relative % 51.7 43.0 - 77.0 %   Lymphocytes Relative 38.1 12.0 - 46.0 %   Monocytes Relative 6.1 3.0 - 12.0 %   Eosinophils Relative 3.3 0.0 - 5.0 %   Basophils Relative 0.8 0.0 - 3.0 %   Neutro Abs 2.1 1.4 - 7.7 K/uL   Lymphs Abs 1.5 0.7 - 4.0 K/uL   Monocytes Absolute 0.2 0.1 - 1.0 K/uL   Eosinophils Absolute 0.1 0.0 - 0.7 K/uL   Basophils Absolute 0.0 0.0 - 0.1 K/uL  IBC panel  Result Value Ref Range   Iron 49 42 - 145 ug/dL   Transferrin 296.0 212.0 - 360.0 mg/dL   Saturation Ratios 11.8 (L) 20.0 - 50.0 %  VITAMIN D 25 Hydroxy (Vit-D Deficiency, Fractures)  Result  Value Ref Range   VITD 29.86 (L) 30.00 - 100.00 ng/mL  Vitamin B12  Result Value Ref Range   Vitamin B-12 556 211 - 911 pg/mL  Comprehensive metabolic panel  Result Value Ref Range   Sodium 140 135 - 145 mEq/L   Potassium 4.3 3.5 - 5.1 mEq/L   Chloride 105 96 - 112 mEq/L   CO2 28 19 - 32 mEq/L   Glucose, Bld 96 70 - 99 mg/dL   BUN 16 6 - 23 mg/dL   Creatinine, Ser 0.76 0.40 - 1.20 mg/dL   Total Bilirubin 0.4 0.2 - 1.2 mg/dL   Alkaline Phosphatase 62 39 - 117 U/L   AST 11 0 - 37 U/L   ALT 3 0 - 35 U/L   Total Protein 6.3 6.0 - 8.3 g/dL   Albumin 3.9 3.5 - 5.2 g/dL   Calcium 9.2 8.4 - 10.5 mg/dL   GFR 79.90 >60.00 mL/min  Lipid panel  Result Value Ref Range   Cholesterol 168 0 - 200 mg/dL   Triglycerides 82.0 0.0 - 149.0 mg/dL   HDL 56.10 >39.00 mg/dL   VLDL 16.4 0.0 - 40.0 mg/dL   LDL Cholesterol 95 0 - 99 mg/dL   Total CHOL/HDL Ratio 3    NonHDL 111.74       Assessment & Plan:   Problem List Items Addressed This Visit    Vitamin D deficiency    rec start vit d 1000 IU daily.       Vitamin B12 deficiency    Continue b12 shots.       Right lumbar radiculopathy    Ongoing trouble. Better with activity, worse with rest. She desires to undergo colonoscopy then may consider lumbar MRI and ESI       Parkinson's disease ([place])    Appreciate excellent care by Dr [name]. She has recently started amantadine bid for med related dyskinesia.       Osteoporosis    Reviewed latest DEXA. She is regularly walking now! Continue Q69mo prolia.      Relevant Medications   Cholecalciferol (VITAMIN D3) 25 MCG (1000 UT) CAPS   MDD (major depressive disorder), single episode, moderate (HCC)    Chronic, stable continue current regimen.       HLD (hyperlipidemia)    Chronic, stable off meds The  10-year ASCVD risk score Mikey Bussing DC Brooke Bonito., et al., 2013) is: 7.5%   Values used to calculate the score:     Age: 55 years     Sex: Female     Is Non-Hispanic African American: No     Diabetic:  No     Tobacco smoker: No     Systolic Blood Pressure: 110 mmHg     Is BP treated: No     HDL Cholesterol: 56.1 mg/dL     Total Cholesterol: 168 mg/dL       Health maintenance examination - Primary    Preventative protocols reviewed and updated unless pt declined. Discussed healthy diet and lifestyle.       GERD    Has not been taking PPI - denies significant reflux concerns.       GAD (generalized anxiety disorder)   Fibromyalgia   Anemia    Off iron. Will await colonoscopy.           Meds ordered this encounter  Medications  . traMADol (ULTRAM) 50 MG tablet    Sig: TAKE 1 TABLET BY MOUTH 3 TIMES A DAY AS NEEDED    Dispense:  50 tablet    Refill:  0    Not to exceed 5 additional fills before 11/06/2018  . Cholecalciferol (VITAMIN D3) 25 MCG (1000 UT) CAPS    Sig: Take 1 capsule (1,000 Units total) by mouth daily.    Dispense:  30 capsule   No orders of the defined types were placed in this encounter.   Follow up plan: Return in about 6 months (around 04/10/2019) for follow up visit.  [name], MD

## 2018-10-10 NOTE — Assessment & Plan Note (Signed)
Chronic, stable continue current regimen.  

## 2018-10-10 NOTE — Patient Instructions (Addendum)
If interested, check with pharmacy about new 2 shot shingles series (shingrix).  Call to schedule colonoscopy  Let me know if intrested in further lumbar evaluation (MRI).  You are doing well today! Continue current medicines.  Start vitamin D 1000 units daily.  Return as needed or in 6 months for follow up visit.   Health Maintenance, Female Adopting a healthy lifestyle and getting preventive care can go a long way to promote health and wellness. Talk with your health care provider about what schedule of regular examinations is right for you. This is a good chance for you to check in with your provider about disease prevention and staying healthy. In between checkups, there are plenty of things you can do on your own. Experts have done a lot of research about which lifestyle changes and preventive measures are most likely to keep you healthy. Ask your health care provider for more information. Weight and diet Eat a healthy diet  Be sure to include plenty of vegetables, fruits, low-fat dairy products, and lean protein.  Do not eat a lot of foods high in solid fats, added sugars, or salt.  Get regular exercise. This is one of the most important things you can do for your health. ? Most adults should exercise for at least 150 minutes each week. The exercise should increase your heart rate and make you sweat (moderate-intensity exercise). ? Most adults should also do strengthening exercises at least twice a week. This is in addition to the moderate-intensity exercise.  Maintain a healthy weight  Body mass index (BMI) is a measurement that can be used to identify possible weight problems. It estimates body fat based on height and weight. Your health care provider can help determine your BMI and help you achieve or maintain a healthy weight.  For females 34 years of age and older: ? A BMI below 18.5 is considered underweight. ? A BMI of 18.5 to 24.9 is normal. ? A BMI of 25 to 29.9 is  considered overweight. ? A BMI of 30 and above is considered obese.  Watch levels of cholesterol and blood lipids  You should start having your blood tested for lipids and cholesterol at 70 years of age, then have this test every 5 years.  You may need to have your cholesterol levels checked more often if: ? Your lipid or cholesterol levels are high. ? You are older than 70 years of age. ? You are at high risk for heart disease.  Cancer screening Lung Cancer  Lung cancer screening is recommended for adults 97-17 years old who are at high risk for lung cancer because of a history of smoking.  A yearly low-dose CT scan of the lungs is recommended for people who: ? Currently smoke. ? Have quit within the past 15 years. ? Have at least a 30-pack-year history of smoking. A pack year is smoking an average of one pack of cigarettes a day for 1 year.  Yearly screening should continue until it has been 15 years since you quit.  Yearly screening should stop if you develop a health problem that would prevent you from having lung cancer treatment.  Breast Cancer  Practice breast self-awareness. This means understanding how your breasts normally appear and feel.  It also means doing regular breast self-exams. Let your health care provider know about any changes, no matter how small.  If you are in your 20s or 30s, you should have a clinical breast exam (CBE) by a health care  provider every 1-3 years as part of a regular health exam.  If you are 40 or older, have a CBE every year. Also consider having a breast X-ray (mammogram) every year.  If you have a family history of breast cancer, talk to your health care provider about genetic screening.  If you are at high risk for breast cancer, talk to your health care provider about having an MRI and a mammogram every year.  Breast cancer gene (BRCA) assessment is recommended for women who have family members with BRCA-related cancers.  BRCA-related cancers include: ? Breast. ? Ovarian. ? Tubal. ? Peritoneal cancers.  Results of the assessment will determine the need for genetic counseling and BRCA1 and BRCA2 testing.  Cervical Cancer Your health care provider may recommend that you be screened regularly for cancer of the pelvic organs (ovaries, uterus, and vagina). This screening involves a pelvic examination, including checking for microscopic changes to the surface of your cervix (Pap test). You may be encouraged to have this screening done every 3 years, beginning at age 21.  For women ages 30-65, health care providers may recommend pelvic exams and Pap testing every 3 years, or they may recommend the Pap and pelvic exam, combined with testing for human papilloma virus (HPV), every 5 years. Some types of HPV increase your risk of cervical cancer. Testing for HPV may also be done on women of any age with unclear Pap test results.  Other health care providers may not recommend any screening for nonpregnant women who are considered low risk for pelvic cancer and who do not have symptoms. Ask your health care provider if a screening pelvic exam is right for you.  If you have had past treatment for cervical cancer or a condition that could lead to cancer, you need Pap tests and screening for cancer for at least 20 years after your treatment. If Pap tests have been discontinued, your risk factors (such as having a new sexual partner) need to be reassessed to determine if screening should resume. Some women have medical problems that increase the chance of getting cervical cancer. In these cases, your health care provider may recommend more frequent screening and Pap tests.  Colorectal Cancer  This type of cancer can be detected and often prevented.  Routine colorectal cancer screening usually begins at 70 years of age and continues through 70 years of age.  Your health care provider may recommend screening at an earlier age if  you have risk factors for colon cancer.  Your health care provider may also recommend using home test kits to check for hidden blood in the stool.  A small camera at the end of a tube can be used to examine your colon directly (sigmoidoscopy or colonoscopy). This is done to check for the earliest forms of colorectal cancer.  Routine screening usually begins at age 50.  Direct examination of the colon should be repeated every 5-10 years through 70 years of age. However, you may need to be screened more often if early forms of precancerous polyps or small growths are found.  Skin Cancer  Check your skin from head to toe regularly.  Tell your health care provider about any new moles or changes in moles, especially if there is a change in a mole's shape or color.  Also tell your health care provider if you have a mole that is larger than the size of a pencil eraser.  Always use sunscreen. Apply sunscreen liberally and repeatedly throughout the day.    Protect yourself by wearing long sleeves, pants, a wide-brimmed hat, and sunglasses whenever you are outside.  Heart disease, diabetes, and high blood pressure  High blood pressure causes heart disease and increases the risk of stroke. High blood pressure is more likely to develop in: ? People who have blood pressure in the high end of the normal range (130-139/85-89 mm Hg). ? People who are overweight or obese. ? People who are African American.  If you are 59-59 years of age, have your blood pressure checked every 3-5 years. If you are 12 years of age or older, have your blood pressure checked every year. You should have your blood pressure measured twice-once when you are at a hospital or clinic, and once when you are not at a hospital or clinic. Record the average of the two measurements. To check your blood pressure when you are not at a hospital or clinic, you can use: ? An automated blood pressure machine at a pharmacy. ? A home blood  pressure monitor.  If you are between 32 years and 28 years old, ask your health care provider if you should take aspirin to prevent strokes.  Have regular diabetes screenings. This involves taking a blood sample to check your fasting blood sugar level. ? If you are at a normal weight and have a low risk for diabetes, have this test once every three years after 70 years of age. ? If you are overweight and have a high risk for diabetes, consider being tested at a younger age or more often. Preventing infection Hepatitis B  If you have a higher risk for hepatitis B, you should be screened for this virus. You are considered at high risk for hepatitis B if: ? You were born in a country where hepatitis B is common. Ask your health care provider which countries are considered high risk. ? Your parents were born in a high-risk country, and you have not been immunized against hepatitis B (hepatitis B vaccine). ? You have HIV or AIDS. ? You use needles to inject street drugs. ? You live with someone who has hepatitis B. ? You have had sex with someone who has hepatitis B. ? You get hemodialysis treatment. ? You take certain medicines for conditions, including cancer, organ transplantation, and autoimmune conditions.  Hepatitis C  Blood testing is recommended for: ? Everyone born from [birth year] through 1963. ? Anyone with known risk factors for hepatitis C.  Sexually transmitted infections (STIs)  You should be screened for sexually transmitted infections (STIs) including gonorrhea and chlamydia if: ? You are sexually active and are younger than 70 years of age. ? You are older than 70 years of age and your health care provider tells you that you are at risk for this type of infection. ? Your sexual activity has changed since you were last screened and you are at an increased risk for chlamydia or gonorrhea. Ask your health care provider if you are at risk.  If you do not have HIV, but are at risk,  it may be recommended that you take a prescription medicine daily to prevent HIV infection. This is called pre-exposure prophylaxis (PrEP). You are considered at risk if: ? You are sexually active and do not regularly use condoms or know the HIV status of your partner(s). ? You take drugs by injection. ? You are sexually active with a partner who has HIV.  Talk with your health care provider about whether you are at high risk of  being infected with HIV. If you choose to begin PrEP, you should first be tested for HIV. You should then be tested every 3 months for as long as you are taking PrEP. Pregnancy  If you are premenopausal and you may become pregnant, ask your health care provider about preconception counseling.  If you may become pregnant, take 400 to 800 micrograms (mcg) of folic acid every day.  If you want to prevent pregnancy, talk to your health care provider about birth control (contraception). Osteoporosis and menopause  Osteoporosis is a disease in which the bones lose minerals and strength with aging. This can result in serious bone fractures. Your risk for osteoporosis can be identified using a bone density scan.  If you are 72 years of age or older, or if you are at risk for osteoporosis and fractures, ask your health care provider if you should be screened.  Ask your health care provider whether you should take a calcium or vitamin D supplement to lower your risk for osteoporosis.  Menopause may have certain physical symptoms and risks.  Hormone replacement therapy may reduce some of these symptoms and risks. Talk to your health care provider about whether hormone replacement therapy is right for you. Follow these instructions at home:  Schedule regular health, dental, and eye exams.  Stay current with your immunizations.  Do not use any tobacco products including cigarettes, chewing tobacco, or electronic cigarettes.  If you are pregnant, do not drink  alcohol.  If you are breastfeeding, limit how much and how often you drink alcohol.  Limit alcohol intake to no more than 1 drink per day for nonpregnant women. One drink equals 12 ounces of beer, 5 ounces of wine, or 1 ounces of hard liquor.  Do not use street drugs.  Do not share needles.  Ask your health care provider for help if you need support or information about quitting drugs.  Tell your health care provider if you often feel depressed.  Tell your health care provider if you have ever been abused or do not feel safe at home. This information is not intended to replace advice given to you by your health care provider. Make sure you discuss any questions you have with your health care provider. Document Released: 05/29/2011 Document Revised: 04/20/2016 Document Reviewed: 08/17/2015 Elsevier Interactive Patient Education  [employer].

## 2018-10-10 NOTE — Assessment & Plan Note (Signed)
Continue b12 shots.  

## 2018-10-14 ENCOUNTER — Encounter: Payer: Self-pay | Admitting: Gastroenterology

## 2018-10-22 ENCOUNTER — Telehealth: Payer: Self-pay | Admitting: *Deleted

## 2018-10-22 NOTE — Telephone Encounter (Signed)
Information has been submitted to pts insurance for verification of benefits. Awaiting response for coverage  

## 2018-10-28 ENCOUNTER — Other Ambulatory Visit: Payer: Self-pay | Admitting: Family Medicine

## 2018-10-28 ENCOUNTER — Encounter: Payer: Self-pay | Admitting: Family Medicine

## 2018-10-28 DIAGNOSIS — M5416 Radiculopathy, lumbar region: Secondary | ICD-10-CM

## 2018-10-28 NOTE — Telephone Encounter (Signed)
Electronic refill request Gabapentin Last office visit 10/10/18 Last refill 03/31/18 #90

## 2018-11-04 ENCOUNTER — Ambulatory Visit
Admission: RE | Admit: 2018-11-04 | Discharge: 2018-11-04 | Disposition: A | Discharge status: Home / Self Care | Payer: Medicare Other | Source: Ambulatory Visit | Attending: Family Medicine | Admitting: Family Medicine

## 2018-11-04 DIAGNOSIS — Z1231 Encounter for screening mammogram for malignant neoplasm of breast: Secondary | ICD-10-CM

## 2018-11-05 ENCOUNTER — Encounter: Payer: Self-pay | Admitting: Gastroenterology

## 2018-11-05 ENCOUNTER — Ambulatory Visit (AMBULATORY_SURGERY_CENTER): Payer: Self-pay

## 2018-11-05 ENCOUNTER — Encounter: Payer: Self-pay | Admitting: Family Medicine

## 2018-11-05 VITALS — Ht 65.0 in | Wt 154.9 lb

## 2018-11-05 DIAGNOSIS — Z1211 Encounter for screening for malignant neoplasm of colon: Secondary | ICD-10-CM

## 2018-11-05 LAB — HM MAMMOGRAPHY

## 2018-11-05 MED ORDER — NA SULFATE-K SULFATE-MG SULF 17.5-3.13-1.6 GM/177ML PO SOLN: 1.0000 | Freq: Once | ORAL | 0 refills | Status: AC

## 2018-11-05 NOTE — Progress Notes (Signed)
Denies allergies to eggs or soy products. Denies complication of anesthesia or sedation. Denies use of weight loss medication. Denies use of O2.   Emmi instructions declined.  

## 2018-11-06 ENCOUNTER — Ambulatory Visit (HOSPITAL_COMMUNITY)
Admission: RE | Admit: 2018-11-06 | Discharge: 2018-11-06 | Disposition: A | Discharge status: Home / Self Care | Payer: Medicare Other | Source: Ambulatory Visit | Attending: Family Medicine | Admitting: Family Medicine

## 2018-11-06 DIAGNOSIS — M5116 Intervertebral disc disorders with radiculopathy, lumbar region: Secondary | ICD-10-CM | POA: Diagnosis not present

## 2018-11-06 DIAGNOSIS — M48061 Spinal stenosis, lumbar region without neurogenic claudication: Secondary | ICD-10-CM | POA: Insufficient documentation

## 2018-11-06 DIAGNOSIS — M5416 Radiculopathy, lumbar region: Secondary | ICD-10-CM | POA: Diagnosis present

## 2018-11-06 DIAGNOSIS — M5127 Other intervertebral disc displacement, lumbosacral region: Secondary | ICD-10-CM | POA: Diagnosis not present

## 2018-11-07 ENCOUNTER — Other Ambulatory Visit: Payer: Self-pay | Admitting: Family Medicine

## 2018-11-07 ENCOUNTER — Ambulatory Visit (INDEPENDENT_AMBULATORY_CARE_PROVIDER_SITE_OTHER): Payer: Medicare Other | Admitting: *Deleted

## 2018-11-07 DIAGNOSIS — M5416 Radiculopathy, lumbar region: Secondary | ICD-10-CM

## 2018-11-07 DIAGNOSIS — E538 Deficiency of other specified B group vitamins: Secondary | ICD-10-CM | POA: Diagnosis not present

## 2018-11-07 MED ORDER — CYANOCOBALAMIN 1000 MCG/ML IJ SOLN
1000.0000 ug | Freq: Once | INTRAMUSCULAR | Status: AC
  Administered 2018-11-07: 1000 ug via INTRAMUSCULAR

## 2018-11-07 NOTE — Progress Notes (Signed)
Per orders of Dr. Damita Dunnings, injection of B12 given by Tammi Sou. Patient tolerated injection well.  (Dr. Danise Mina off today)

## 2018-11-18 ENCOUNTER — Ambulatory Visit (AMBULATORY_SURGERY_CENTER): Payer: Medicare Other | Admitting: Gastroenterology

## 2018-11-18 ENCOUNTER — Encounter: Payer: Self-pay | Admitting: Gastroenterology

## 2018-11-18 VITALS — BP 129/57 | HR 201 | Temp 96.4°F | Resp 15 | Ht 66.0 in | Wt 160.0 lb

## 2018-11-18 DIAGNOSIS — Z1211 Encounter for screening for malignant neoplasm of colon: Secondary | ICD-10-CM

## 2018-11-18 DIAGNOSIS — D122 Benign neoplasm of ascending colon: Secondary | ICD-10-CM | POA: Diagnosis not present

## 2018-11-18 DIAGNOSIS — D120 Benign neoplasm of cecum: Secondary | ICD-10-CM | POA: Diagnosis not present

## 2018-11-18 DIAGNOSIS — D125 Benign neoplasm of sigmoid colon: Secondary | ICD-10-CM

## 2018-11-18 DIAGNOSIS — K635 Polyp of colon: Secondary | ICD-10-CM

## 2018-11-18 DIAGNOSIS — D123 Benign neoplasm of transverse colon: Secondary | ICD-10-CM

## 2018-11-18 MED ORDER — SODIUM CHLORIDE 0.9 % IV SOLN: 500.0000 mL | Freq: Once | INTRAVENOUS | Status: DC

## 2018-11-18 NOTE — Progress Notes (Signed)
Report to PACU, RN, vss, BBS= Clear.  

## 2018-11-18 NOTE — Op Note (Signed)
[facility] Patient Name: [name] Procedure Date: 11/18/2018 3:23 PM MRN: [id number] Endoscopist: Ladene Artist , MD Age: 72 Referring MD:  Date of Birth: 26-Feb-1946 Gender: Female Account #: [id number] Procedure:                Colonoscopy Indications:              Screening for colorectal malignant neoplasm Medicines:                Monitored Anesthesia Care Procedure:                Pre-Anesthesia Assessment:                           - Prior to the procedure, a History and Physical                            was performed, and patient medications and                            allergies were reviewed. The patient's tolerance of                            previous anesthesia was also reviewed. The risks                            and benefits of the procedure and the sedation                            options and risks were discussed with the patient.                            All questions were answered, and informed consent                            was obtained. Prior Anticoagulants: The patient has                            taken no previous anticoagulant or antiplatelet                            agents. ASA Grade Assessment: III - A patient with                            severe systemic disease. After reviewing the risks                            and benefits, the patient was deemed in                            satisfactory condition to undergo the procedure.                           After obtaining informed consent, the colonoscope  was passed under direct vision. Throughout the                            procedure, the patient's blood pressure, pulse, and                            oxygen saturations were monitored continuously. The                            Colonoscope was introduced through the anus and                            advanced to the the cecum, identified by                            appendiceal orifice and  ileocecal valve. The                            ileocecal valve, appendiceal orifice, and rectum                            were photographed. The quality of the bowel                            preparation was adequate after extensive lavage and                            suction. The patient tolerated the procedure well.                            The colonoscopy was somewhat difficult due to a                            tortuous colon. Scope In: 3:33:54 PM Scope Out: 4:01:51 PM Scope Withdrawal Time: 0 hours 21 minutes 41 seconds  Total Procedure Duration: 0 hours 27 minutes 57 seconds  Findings:                 The perianal and digital rectal examinations were                            normal.                           Five sessile polyps were found in the transverse                            colon (1), ascending colon (3) and cecum (1). The                            polyps were 7 to 8 mm in size. These polyps were                            removed with a cold snare. Resection and retrieval  were complete.                           Three sessile polyps were found in the sigmoid                            colon (1) and transverse colon (2). The polyps were                            12 to 15 mm in size. These polyps were removed with                            a hot snare. Resection and retrieval were complete.                           Two sessile polyps were found in the ascending                            colon. The polyps were 4 mm in size. These polyps                            were removed with a cold biopsy forceps. Resection                            and retrieval were complete.                           The exam was otherwise without abnormality on                            direct and retroflexion views. Complications:            No immediate complications. Estimated blood loss:                            None. Estimated Blood Loss:      Estimated blood loss: none. Impression:               - Five 7 to 8 mm polyps in the transverse colon, in                            the ascending colon and in the cecum, removed with                            a cold snare. Resected and retrieved.                           - Three 12 to 15 mm polyps in the sigmoid colon and                            in the transverse colon, removed with a hot snare.  Resected and retrieved.                           - Two 4 mm polyps in the ascending colon, removed                            with a cold biopsy forceps. Resected and retrieved.                           - The examination was otherwise normal on direct                            and retroflexion views. Recommendation:           - Repeat colonoscopy date to be determined after                            pending pathology results are reviewed for                            surveillance with a more extensive bowel prep.                           - Patient has a contact number available for                            emergencies. The signs and symptoms of potential                            delayed complications were discussed with the                            patient. Return to normal activities tomorrow.                            Written discharge instructions were provided to the                            patient.                           - Resume previous diet.                           - Continue present medications.                           - Await pathology results.                           - No aspirin, ibuprofen, naproxen, or other                            non-steroidal anti-inflammatory drugs for 2 weeks  after polyp removal. Ladene Artist, MD 11/18/2018 4:09:11 PM This report has been signed electronically.

## 2018-11-18 NOTE — Progress Notes (Signed)
Verified patient was seen today with [place].

## 2018-11-18 NOTE — Patient Instructions (Signed)
YOU HAD AN ENDOSCOPIC PROCEDURE TODAY AT THE [facility]:   Refer to the procedure report that was given to you for any specific questions about what was found during the examination.  If the procedure report does not answer your questions, please call your gastroenterologist to clarify.  If you requested that your care partner not be given the details of your procedure findings, then the procedure report has been included in a sealed envelope for you to review at your convenience later.  YOU SHOULD EXPECT: Some feelings of bloating in the abdomen. Passage of more gas than usual.  Walking can help get rid of the air that was put into your GI tract during the procedure and reduce the bloating. If you had a lower endoscopy (such as a colonoscopy or flexible sigmoidoscopy) you may notice spotting of blood in your stool or on the toilet paper. If you underwent a bowel prep for your procedure, you may not have a normal bowel movement for a few days.  Please Note:  You might notice some irritation and congestion in your nose or some drainage.  This is from the oxygen used during your procedure.  There is no need for concern and it should clear up in a day or so.  SYMPTOMS TO REPORT IMMEDIATELY:   Following lower endoscopy (colonoscopy or flexible sigmoidoscopy):  Excessive amounts of blood in the stool  Significant tenderness or worsening of abdominal pains  Swelling of the abdomen that is new, acute  Fever of 100F or higher  For urgent or emergent issues, a gastroenterologist can be reached at any hour by calling [phone].   DIET:  We do recommend a small meal at first, but then you may proceed to your regular diet.  Drink plenty of fluids but you should avoid alcoholic beverages for 24 hours.  ACTIVITY:  You should plan to take it easy for the rest of today and you should NOT DRIVE or use heavy machinery until tomorrow (because of the sedation medicines used during the test).     FOLLOW UP: Our staff will call the number listed on your records the next business day following your procedure to check on you and address any questions or concerns that you may have regarding the information given to you following your procedure. If we do not reach you, we will leave a message.  However, if you are feeling well and you are not experiencing any problems, there is no need to return our call.  We will assume that you have returned to your regular daily activities without incident.  If any biopsies were taken you will be contacted by phone or by letter within the next 1-3 weeks.  Please call us at [phone] if you have not heard about the biopsies in 3 weeks.    SIGNATURES/CONFIDENTIALITY: You and/or your care partner have signed paperwork which will be entered into your electronic medical record.  These signatures attest to the fact that that the information above on your After Visit Summary has been reviewed and is understood.  Full responsibility of the confidentiality of this discharge information lies with you and/or your care-partner. 

## 2018-11-18 NOTE — Progress Notes (Signed)
Pt. Reports no change in her medical or surgical history since her pre-visit 11/05/2018.

## 2018-11-18 NOTE — Progress Notes (Signed)
Called to room to assist during endoscopic procedure.  Patient ID and intended procedure confirmed with present staff. Received instructions for my participation in the procedure from the performing physician.  

## 2018-11-19 ENCOUNTER — Telehealth: Payer: Self-pay

## 2018-11-19 NOTE — Telephone Encounter (Signed)
NO ANSWER, MESSAGE LEFT FOR PATIENT. 

## 2018-11-20 NOTE — Progress Notes (Signed)
I reviewed health advisor's note, was available for consultation, and agree with documentation and plan.  

## 2018-11-27 HISTORY — PX: COLONOSCOPY: SHX174

## 2018-12-02 ENCOUNTER — Encounter: Payer: Self-pay | Admitting: Gastroenterology

## 2018-12-03 ENCOUNTER — Encounter: Payer: Self-pay | Admitting: Family Medicine

## 2018-12-03 NOTE — Telephone Encounter (Signed)
Response not received regarding benefits. Resubmitted.

## 2018-12-05 ENCOUNTER — Other Ambulatory Visit: Payer: Self-pay | Admitting: Neurology

## 2018-12-09 ENCOUNTER — Other Ambulatory Visit: Payer: Self-pay | Admitting: Family Medicine

## 2018-12-09 NOTE — Telephone Encounter (Signed)
Name of Medication: Clonazepam Name of Pharmacy: [facility] or Written Date and Quantity: 09/10/18, #60/1 Last Office Visit and Type: 10/10/18, CPE Next Office Visit and Type: 04/10/19, 6 mo f/u Last Controlled Substance Agreement Date: 06/24/14 Last UDS: 06/24/14

## 2018-12-11 NOTE — Telephone Encounter (Signed)
Eprescribed.

## 2018-12-16 NOTE — Telephone Encounter (Signed)
Verification of benefits have been processed and an approval has been received for pts prolia injection. Pts estimated cost are appx $75. This is only an estimate and cannot be confirmed until benefits are paid. Please advise pt and schedule if needed. If scheduled, once the injection is received, pls contact me back with the date it was received so that I am able to update prolia folder. thanks

## 2018-12-16 NOTE — Telephone Encounter (Signed)
Pt is agreeable to out of pocket charges; Ca lab and prolia scheduled.

## 2018-12-17 ENCOUNTER — Other Ambulatory Visit: Payer: Self-pay | Admitting: Family Medicine

## 2018-12-17 DIAGNOSIS — M810 Age-related osteoporosis without current pathological fracture: Secondary | ICD-10-CM

## 2018-12-19 ENCOUNTER — Ambulatory Visit (INDEPENDENT_AMBULATORY_CARE_PROVIDER_SITE_OTHER): Payer: Medicare Other

## 2018-12-19 ENCOUNTER — Other Ambulatory Visit (INDEPENDENT_AMBULATORY_CARE_PROVIDER_SITE_OTHER): Payer: Medicare Other

## 2018-12-19 DIAGNOSIS — E538 Deficiency of other specified B group vitamins: Secondary | ICD-10-CM

## 2018-12-19 DIAGNOSIS — M810 Age-related osteoporosis without current pathological fracture: Secondary | ICD-10-CM | POA: Diagnosis not present

## 2018-12-19 LAB — CALCIUM: Calcium: 9.6 mg/dL (ref 8.4–10.5)

## 2018-12-19 MED ORDER — CYANOCOBALAMIN 1000 MCG/ML IJ SOLN
1000.0000 ug | Freq: Once | INTRAMUSCULAR | Status: AC
  Administered 2018-12-19: 1000 ug via INTRAMUSCULAR

## 2018-12-19 NOTE — Progress Notes (Signed)
Per orders of Dr. Duncan, injection of vit B12 given by Priscilla Finklea. Patient tolerated injection well.  

## 2018-12-24 ENCOUNTER — Other Ambulatory Visit: Payer: Self-pay | Admitting: Neurology

## 2018-12-24 ENCOUNTER — Other Ambulatory Visit: Payer: Self-pay | Admitting: Family Medicine

## 2018-12-24 NOTE — Telephone Encounter (Signed)
Name of Medication: Tramadol Name of Pharmacy: [facility] or Written Date and Quantity: 10/10/18, #50/0 Last Office Visit and Type: 10/10/18, CPE Next Office Visit and Type: 04/10/19, 6 mo f/u Last Controlled Substance Agreement Date: 04/28/16 Last UDS: 04/28/16

## 2018-12-25 ENCOUNTER — Ambulatory Visit (INDEPENDENT_AMBULATORY_CARE_PROVIDER_SITE_OTHER): Payer: Medicare Other

## 2018-12-25 DIAGNOSIS — M810 Age-related osteoporosis without current pathological fracture: Secondary | ICD-10-CM | POA: Diagnosis not present

## 2018-12-25 MED ORDER — DENOSUMAB 60 MG/ML ~~LOC~~ SOSY
60.0000 mg | PREFILLED_SYRINGE | Freq: Once | SUBCUTANEOUS | Status: AC
  Administered 2018-12-25: 60 mg via SUBCUTANEOUS

## 2018-12-25 NOTE — Telephone Encounter (Signed)
Eprescribed.

## 2018-12-25 NOTE — Progress Notes (Signed)
Per orders of Dr. Danise Mina, injection of Prolia given by Lurlean Nanny. Patient tolerated injection well.

## 2019-01-15 ENCOUNTER — Encounter: Payer: Self-pay | Admitting: Family Medicine

## 2019-01-15 ENCOUNTER — Other Ambulatory Visit: Payer: Self-pay | Admitting: Neurology

## 2019-01-30 ENCOUNTER — Other Ambulatory Visit: Payer: Self-pay | Admitting: Family Medicine

## 2019-01-31 NOTE — Telephone Encounter (Signed)
Gabapenitn Last filled:  10/31/18, #90 Last OV:  10/10/18, CPE Pt 2 Next OV:  6 mo f/u

## 2019-02-04 ENCOUNTER — Other Ambulatory Visit: Payer: Self-pay | Admitting: Family Medicine

## 2019-02-04 NOTE — Telephone Encounter (Signed)
Name of Medication: Tramadol Name of Pharmacy: [facility] or Written Date and Quantity: 12/25/18 Last Office Visit and Type: 10/10/18, CPE Pt 2 Next Office Visit and Type: 04/10/19, 6 mo f/u Last Controlled Substance Agreement Date: 04/28/16 Last UDS: 04/28/16, scanned

## 2019-02-04 NOTE — Telephone Encounter (Signed)
Eprescribed.
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
## 2019-02-11 ENCOUNTER — Encounter: Payer: Self-pay | Admitting: Neurology

## 2019-02-11 ENCOUNTER — Ambulatory Visit: Payer: Medicare Other | Admitting: Neurology

## 2019-02-11 ENCOUNTER — Other Ambulatory Visit: Payer: Self-pay

## 2019-02-11 VITALS — BP 102/62 | HR 80 | Temp 98.0°F | Ht 65.75 in | Wt 155.0 lb

## 2019-02-11 DIAGNOSIS — M5416 Radiculopathy, lumbar region: Secondary | ICD-10-CM

## 2019-02-11 DIAGNOSIS — G249 Dystonia, unspecified: Secondary | ICD-10-CM | POA: Diagnosis not present

## 2019-02-11 DIAGNOSIS — G20 Parkinson's disease: Secondary | ICD-10-CM | POA: Diagnosis not present

## 2019-02-11 NOTE — Patient Instructions (Signed)
1. We will work on getting Northera approved for you and restarting medication.

## 2019-02-14 ENCOUNTER — Telehealth: Payer: Self-pay | Admitting: Family Medicine

## 2019-02-14 NOTE — Telephone Encounter (Signed)
Patient called to schedule a nurse visit. The first available in the morning is in April.  Patient said she missed the shot in February and doesn't want to miss March,also.  Patient said she wants to know if a rx can be called to [facility], so she can give the shot to herself.  She said she use to give her Father shots and she.also, knows someone in healthcare that can give her the shot.

## 2019-02-17 ENCOUNTER — Encounter: Payer: Self-pay | Admitting: Neurology

## 2019-02-17 MED ORDER — "SYRINGE/NEEDLE (DISP) 18G X 1"" 1 ML MISC": 0 refills | Status: DC

## 2019-02-17 MED ORDER — "SYRINGE/NEEDLE (DISP) 25G X 1"" 1 ML MISC": 0 refills | Status: DC

## 2019-02-17 MED ORDER — CYANOCOBALAMIN 1000 MCG/ML IJ SOLN: 1000.0000 ug | INTRAMUSCULAR | 1 refills | Status: DC

## 2019-02-17 NOTE — Progress Notes (Signed)
Received authorization for Northera 100 mg capsules valid through 11/27/2019.

## 2019-02-17 NOTE — Telephone Encounter (Signed)
Left message for pt to call back.  Need to relay Dr. G's message.  

## 2019-02-17 NOTE — Telephone Encounter (Signed)
Ok to try at home. I've sent in a dose to try, then may let us know how she does and may be able to transition to home injection.

## 2019-02-17 NOTE — Telephone Encounter (Signed)
please review

## 2019-02-17 NOTE — Telephone Encounter (Signed)
Pt returned call.  Relayed Dr. Synthia Innocent message. Pt expresses her thanks for everything.

## 2019-02-18 ENCOUNTER — Other Ambulatory Visit: Payer: Self-pay | Admitting: Neurology

## 2019-03-07 ENCOUNTER — Other Ambulatory Visit: Payer: Self-pay | Admitting: Neurology

## 2019-03-12 ENCOUNTER — Other Ambulatory Visit: Payer: Self-pay | Admitting: Family Medicine

## 2019-03-18 NOTE — Telephone Encounter (Signed)
Last office visit 10/10/2018 for CPE.  Last refilled 02/17/2019 for 1 ml with 1 refill.  Last Vit B12 level was normal at 556 pg/ml.  Next Appt: 04/10/2019 for 6 month follow up. Ok to refill?

## 2019-03-23 ENCOUNTER — Encounter: Payer: Self-pay | Admitting: Family Medicine

## 2019-03-24 NOTE — Telephone Encounter (Signed)
Name of Medication: Clonazepam, Tramadol Name of Pharmacy: [facility] or Written Date and Quantity:      Clonazepam:  12/11/18, #60/1     Tramadol:  02/04/19, #50/0 Last Office Visit and Type: 10/10/18, CPE Pt 2 Next Office Visit and Type: 04/10/19, 6 mo f/u Last Controlled Substance Agreement Date: 04/28/16 Last UDS: 04/28/16

## 2019-03-25 MED ORDER — TRAMADOL HCL 50 MG PO TABS: 50.0000 mg | ORAL_TABLET | Freq: Three times a day (TID) | ORAL | 0 refills | Status: DC | PRN

## 2019-03-25 MED ORDER — CLONAZEPAM 0.5 MG PO TABS: 0.5000 mg | ORAL_TABLET | Freq: Two times a day (BID) | ORAL | 1 refills | Status: DC | PRN

## 2019-03-25 NOTE — Telephone Encounter (Signed)
Eprescribed.

## 2019-03-28 ENCOUNTER — Telehealth: Payer: Self-pay | Admitting: *Deleted

## 2019-03-28 NOTE — Telephone Encounter (Signed)
Received letter stating that patient has been unable to reach by [facility].  They have been trying to deliver her Northera.  I called patient and left message giving her the phone number to call them.  [phone]

## 2019-04-08 ENCOUNTER — Other Ambulatory Visit: Payer: Self-pay | Admitting: Family Medicine

## 2019-04-08 ENCOUNTER — Other Ambulatory Visit (INDEPENDENT_AMBULATORY_CARE_PROVIDER_SITE_OTHER): Payer: Medicare Other

## 2019-04-08 DIAGNOSIS — E538 Deficiency of other specified B group vitamins: Secondary | ICD-10-CM | POA: Diagnosis not present

## 2019-04-08 DIAGNOSIS — D649 Anemia, unspecified: Secondary | ICD-10-CM

## 2019-04-08 DIAGNOSIS — E559 Vitamin D deficiency, unspecified: Secondary | ICD-10-CM

## 2019-04-08 DIAGNOSIS — E785 Hyperlipidemia, unspecified: Secondary | ICD-10-CM

## 2019-04-08 LAB — CBC WITH DIFFERENTIAL/PLATELET
Basophils Absolute: 0 10*3/uL (ref 0.0–0.1)
Basophils Relative: 0.8 % (ref 0.0–3.0)
Eosinophils Absolute: 0.2 10*3/uL (ref 0.0–0.7)
Eosinophils Relative: 4.6 % (ref 0.0–5.0)
HCT: 35.6 % — ABNORMAL LOW (ref 36.0–46.0)
Hemoglobin: 11.8 g/dL — ABNORMAL LOW (ref 12.0–15.0)
Lymphocytes Relative: 36.1 % (ref 12.0–46.0)
Lymphs Abs: 1.5 10*3/uL (ref 0.7–4.0)
MCHC: 33.1 g/dL (ref 30.0–36.0)
MCV: 83.1 fl (ref 78.0–100.0)
Monocytes Absolute: 0.3 10*3/uL (ref 0.1–1.0)
Monocytes Relative: 6.3 % (ref 3.0–12.0)
Neutro Abs: 2.2 10*3/uL (ref 1.4–7.7)
Neutrophils Relative %: 52.2 % (ref 43.0–77.0)
Platelets: 221 10*3/uL (ref 150.0–400.0)
RBC: 4.28 Mil/uL (ref 3.87–5.11)
RDW: 16 % — ABNORMAL HIGH (ref 11.5–15.5)
WBC: 4.2 10*3/uL (ref 4.0–10.5)

## 2019-04-08 LAB — VITAMIN B12: Vitamin B-12: 373 pg/mL (ref 211–911)

## 2019-04-08 LAB — VITAMIN D 25 HYDROXY (VIT D DEFICIENCY, FRACTURES): VITD: 35.52 ng/mL (ref 30.00–100.00)

## 2019-04-10 ENCOUNTER — Encounter: Payer: Self-pay | Admitting: Family Medicine

## 2019-04-10 ENCOUNTER — Ambulatory Visit (INDEPENDENT_AMBULATORY_CARE_PROVIDER_SITE_OTHER): Payer: Medicare Other | Admitting: Family Medicine

## 2019-04-10 VITALS — BP 100/60 | HR 72 | Ht 66.0 in | Wt 155.0 lb

## 2019-04-10 DIAGNOSIS — E538 Deficiency of other specified B group vitamins: Secondary | ICD-10-CM | POA: Diagnosis not present

## 2019-04-10 DIAGNOSIS — E559 Vitamin D deficiency, unspecified: Secondary | ICD-10-CM

## 2019-04-10 DIAGNOSIS — D649 Anemia, unspecified: Secondary | ICD-10-CM | POA: Diagnosis not present

## 2019-04-10 DIAGNOSIS — G20 Parkinson's disease: Secondary | ICD-10-CM

## 2019-04-10 DIAGNOSIS — M5136 Other intervertebral disc degeneration, lumbar region: Secondary | ICD-10-CM

## 2019-04-10 DIAGNOSIS — M797 Fibromyalgia: Secondary | ICD-10-CM

## 2019-04-10 DIAGNOSIS — M5416 Radiculopathy, lumbar region: Secondary | ICD-10-CM | POA: Diagnosis not present

## 2019-04-10 DIAGNOSIS — I951 Orthostatic hypotension: Secondary | ICD-10-CM

## 2019-04-10 NOTE — Assessment & Plan Note (Signed)
Mild, improving. Had colonoscopy earlier this year.

## 2019-04-10 NOTE — Assessment & Plan Note (Signed)
Pending 3rd ESI - limited benefit. Appreciate PM&R and neurosurgery care.

## 2019-04-10 NOTE — Assessment & Plan Note (Signed)
Levels now normal - continue vit D 1000 IU daily.

## 2019-04-10 NOTE — Assessment & Plan Note (Signed)
Has restarted northera per neuro.

## 2019-04-10 NOTE — Progress Notes (Signed)
Virtual visit completed through [employer]. Due to national recommendations of social distancing due to [place] 19, a virtual visit is felt to be most appropriate for this patient at this time. Interactive audio and video telecommunications were attempted between myself and [name], however failed due to patient having technical difficulties (poor connectivity). We continued and completed visit with audio only.   Patient location: home, son [name] also on call Provider location: [place] at [facility], office If any vitals were documented, they were collected by patient at home unless specified below.    BP 100/60 (BP Location: Left Arm, Patient Position: Sitting)   Pulse 72   Ht 5\' 6"  (1.676 m)   Wt 155 lb (70.3 kg)   BMI 25.02 kg/m    CC: 6 mo f/u visit Subjective:    Patient ID: [name], female    DOB: February 08, 1946, 73 y.o.   MRN: [id number]  HPI: [name] is a 73 y.o. female presenting on 04/10/2019 for Follow-up (6 mo f/u.)  Saw Dr [name] for PD - note reviewed. Stable period. Some trouble with orthostasis that has led to fall.   Had colonoscopy earlier this year - planned return in 2 yrs due to multiple TAs.   Seeing Dr Nelva Bush and Rolena Infante for known R lumbar radiculopathy - difficult situation with her parkinson's surgeon is hesitant to perform surgery. Has had 2, planning 3rd over last few months. She takes gabapentin 100mg  at bedtime.      Relevant past medical, surgical, family and social history reviewed and updated as indicated. Interim medical history since our last visit reviewed. Allergies and medications reviewed and updated. Outpatient Medications Prior to Visit  Medication Sig Dispense Refill  . alfuzosin (UROXATRAL) 10 MG 24 hr tablet Take 1 tablet by mouth daily.    Marland Kitchen amantadine (SYMMETREL) 100 MG capsule TAKE 1 CAPSULE BY MOUTH TWICE A DAY 180 capsule 1  . Ascorbic Acid (VITAMIN C) 100 MG tablet Take 100 mg by mouth daily.    . Calcium Carbonate-Vitamin D  (CALCIUM 600/VITAMIN D) 600-400 MG-UNIT chew tablet Chew 1 tablet by mouth daily.    . carbidopa-levodopa (SINEMET CR) 50-200 MG tablet TAKE 1 TABLET BY MOUTH AT BEDTIME. 90 tablet 1  . carbidopa-levodopa (SINEMET IR) 25-100 MG tablet TAKE 1.5 TABLETS BY MOUTH 3 (THREE) TIMES DAILY 405 tablet 1  . Cholecalciferol (VITAMIN D3) 25 MCG (1000 UT) CAPS Take 1 capsule (1,000 Units total) by mouth daily. 30 capsule   . clonazePAM (KLONOPIN) 0.5 MG tablet Take 1 tablet (0.5 mg total) by mouth 2 (two) times daily as needed. 60 tablet 1  . cyanocobalamin (,VITAMIN B-12,) 1000 MCG/ML injection INJECT 1 ML (1,000 MCG TOTAL) INTO THE MUSCLE EVERY 30 (THIRTY) DAYS. 1 mL 5  . gabapentin (NEURONTIN) 100 MG capsule TAKE 1 CAPSULE BY MOUTH AT BEDTIME 90 capsule 3  . latanoprost (XALATAN) 0.005 % ophthalmic solution     . NORTHERA 100 MG CAPS Take 1 capsule by mouth daily.    . polyethylene glycol (MIRALAX / GLYCOLAX) packet Take 17 g by mouth as needed. 30 each 1  . pramipexole (MIRAPEX) 1 MG tablet TAKE 1 TABLET (1 MG TOTAL) BY MOUTH 3 (THREE) TIMES DAILY 270 tablet 1  . sertraline (ZOLOFT) 100 MG tablet TAKE 1 TABLET (100 MG TOTAL) BY MOUTH DAILY. 90 tablet 1  . Syringe/Needle, Disp, 18G X 1" 1 ML MISC Use to draw B12 shot monthly 25 each 0  . Syringe/Needle, Disp,  25G X 1" 1 ML MISC Use to administer B12 shot monthly 25 each 0  . traMADol (ULTRAM) 50 MG tablet Take 1 tablet (50 mg total) by mouth 3 (three) times daily as needed. 50 tablet 0  . triamcinolone cream (KENALOG) 0.1 % Apply 1 application topically 2 (two) times daily. Apply to AA. 45 g 0   No facility-administered medications prior to visit.      Per HPI unless specifically indicated in ROS section below Review of Systems Objective:    BP 100/60 (BP Location: Left Arm, Patient Position: Sitting)   Pulse 72   Ht 5\' 6"  (1.676 m)   Wt 155 lb (70.3 kg)   BMI 25.02 kg/m   Wt Readings from Last 3 Encounters:  04/10/19 155 lb (70.3 kg)  02/11/19  155 lb (70.3 kg)  11/18/18 160 lb (72.6 kg)     Physical exam: Gen: alert, NAD, not ill appearing Pulm: speaks in complete sentences without increased work of breathing Psych: normal mood, normal thought content      Results for orders placed or performed in visit on 04/08/19  Vitamin B12  Result Value Ref Range   Vitamin B-12 373 211 - 911 pg/mL  VITAMIN D 25 Hydroxy (Vit-D Deficiency, Fractures)  Result Value Ref Range   VITD 35.52 30.00 - 100.00 ng/mL  CBC with Differential/Platelet  Result Value Ref Range   WBC 4.2 4.0 - 10.5 K/uL   RBC 4.28 3.87 - 5.11 Mil/uL   Hemoglobin 11.8 (L) 12.0 - 15.0 g/dL   HCT 35.6 (L) 36.0 - 46.0 %   MCV 83.1 78.0 - 100.0 fl   MCHC 33.1 30.0 - 36.0 g/dL   RDW 16.0 (H) 11.5 - 15.5 %   Platelets 221.0 150.0 - 400.0 K/uL   Neutrophils Relative % 52.2 43.0 - 77.0 %   Lymphocytes Relative 36.1 12.0 - 46.0 %   Monocytes Relative 6.3 3.0 - 12.0 %   Eosinophils Relative 4.6 0.0 - 5.0 %   Basophils Relative 0.8 0.0 - 3.0 %   Neutro Abs 2.2 1.4 - 7.7 K/uL   Lymphs Abs 1.5 0.7 - 4.0 K/uL   Monocytes Absolute 0.3 0.1 - 1.0 K/uL   Eosinophils Absolute 0.2 0.0 - 0.7 K/uL   Basophils Absolute 0.0 0.0 - 0.1 K/uL   Assessment & Plan:   Problem List Items Addressed This Visit    Vitamin D deficiency    Levels now normal - continue vit D 1000 IU daily.       Vitamin B12 deficiency    Chronic, stable continue b12 shots monthly.       Right lumbar radiculopathy - Primary    Pending 3rd ESI - limited benefit. Appreciate PM&R and neurosurgery care.       Parkinson's disease ([place])   HYPOTENSION, ORTHOSTATIC    Has restarted northera per neuro.       Relevant Medications   NORTHERA 100 MG CAPS   Fibromyalgia   DDD (degenerative disc disease), lumbar   Anemia    Mild, improving. Had colonoscopy earlier this year.           No orders of the defined types were placed in this encounter.  No orders of the defined types were placed in this  encounter.   Follow up plan: Return in about 6 months (around 10/11/2019) for annual exam, prior fasting for blood work, medicare wellness visit.  [name]

## 2019-04-10 NOTE — Assessment & Plan Note (Signed)
Chronic, stable continue b12 shots monthly.

## 2019-04-16 ENCOUNTER — Other Ambulatory Visit: Payer: Self-pay | Admitting: Neurology

## 2019-04-16 ENCOUNTER — Telehealth: Payer: Self-pay | Admitting: Neurology

## 2019-04-16 NOTE — Telephone Encounter (Signed)
Called patient she was informed of provider response below She will contact their office to inform them. I also informed patient that Dr. Dossie Der office can contact our office for written letter if needed

## 2019-04-16 NOTE — Telephone Encounter (Signed)
Requested Prescriptions   Pending Prescriptions Disp Refills  . [place] 100 MG CAPS [Pharmacy Med Name: NORTHERA 100 MG CAPSULE] 495 capsule 0    Sig: TAKE ACCORDING TO 24 HOUR TITRATION SCHEDULE ON THE ENCLOSED DOSING GUIDE   Last seen: 02/11/19 Rx last filled: 04/07/18 #540 1 refill Follow up: 07/15/19  approved

## 2019-04-16 NOTE — Telephone Encounter (Signed)
Patient is calling in about back pain and parkinsons. She goes to [facility] for the back pain. She is calling in about Dr. Dossie Der needing to know if she has clearance for surgery on her back and that it wont affect the parkinsons. Please call her back at [phone]. Thanks!

## 2019-04-16 NOTE — Telephone Encounter (Signed)
Usually they send me something if they need a letter about that but I have no objection from a PD standpoint only to her having back surgery

## 2019-04-16 NOTE — Telephone Encounter (Signed)
See below. Please advise.  

## 2019-04-24 ENCOUNTER — Telehealth: Payer: Self-pay

## 2019-04-24 NOTE — Telephone Encounter (Signed)
Received hard fax from [facility] in regards to patient [place] comments: We have been trying to reach your patient, in regard to scheduling their next delivery of [place].   At this time the prescription will be placed on hold. If you reach the patient/ parent and they would like to start the medication. Please have them call us at [phone]  ~called patient to inform her to call [facility] to schedule delivery of medication.  No answer left message.

## 2019-04-28 NOTE — Telephone Encounter (Signed)
[name] from [facility] called regarding order of Northera . They need the correct instruction for medicatio as they think that pt has been taking it incorrectly. Pls call [name] at [phone]. Shipping is scheduled for tomorrow 6/2.

## 2019-04-28 NOTE — Telephone Encounter (Signed)
She will need to restart the titration.  Was on 600 mg tid but she had stopped it for a long time.  Needs to restart it over from the 100mg  dose (see me with the northera start form for questions).

## 2019-04-29 ENCOUNTER — Telehealth: Payer: Self-pay | Admitting: Neurology

## 2019-04-29 NOTE — Telephone Encounter (Signed)
Received another hard fax from [facility] requesting directions on how patient should be taken NORTHERA-TITRATION dose  Please clarify with SIG only including dose Will call with instruction  P:[phone] F:[phone]

## 2019-04-29 NOTE — Telephone Encounter (Signed)
Verbal from Dr. Carles Collet started Northera 24 hour Titration schedule  Day 1 100 mg  Day 2 200 mg Day 3 300 mg  Day 4 400 mg  Day 5 500 mg Day 6 600 mg

## 2019-04-29 NOTE — Telephone Encounter (Signed)
Left message with after hour service on 04-29-19 @ 12:27    Izora Gala from [facility] is calling about an order Northera  Please cal back and ask to speak to the pharmacist

## 2019-04-29 NOTE — Telephone Encounter (Signed)
[facility] called spoke with [name] B see other note

## 2019-04-29 NOTE — Telephone Encounter (Signed)
Okay.  Let me know if you need the titration

## 2019-04-29 NOTE — Telephone Encounter (Signed)
Spoke with Brad B at [facility] he was given verbal for patient titration schedule. He will make she patient gets it.

## 2019-04-30 ENCOUNTER — Telehealth: Payer: Self-pay | Admitting: Neurology

## 2019-04-30 NOTE — Telephone Encounter (Signed)
Elmyra Ricks from [facility]  called regarding patient's medication Northera 100mg . She is needing a Maintenance Dose? Please Call [phone]. Thanks

## 2019-05-01 NOTE — Telephone Encounter (Signed)
Called no answer left voice mail on pharmacy line with Rx titration / maintenance dose

## 2019-05-02 NOTE — Telephone Encounter (Signed)
[facility] calling again in reference of dose of Northera, they stated that they need to speak with you. Pls call the pharmacy line at [phone].

## 2019-05-05 ENCOUNTER — Telehealth: Payer: Self-pay | Admitting: Neurology

## 2019-05-05 NOTE — Telephone Encounter (Signed)
Called left maintenance Rx on pharmacy voice mail

## 2019-05-05 NOTE — Telephone Encounter (Signed)
[name] from [facility] calling in about Patient has already started the tytrations on 03/26/19 - he said they are needing a maintenance RX. Please send. Thanks!

## 2019-05-05 NOTE — Telephone Encounter (Signed)
Called [facility] no answer left message with information needed at number given below

## 2019-05-05 NOTE — Telephone Encounter (Signed)
Pharmacy called stating that medication does not come in 600 mg. It is either 200mg  or 300mg . They were wondering if 300mg  TID was ok. Pls call [phone]

## 2019-05-05 NOTE — Telephone Encounter (Signed)
See below. Please advise.  

## 2019-05-05 NOTE — Telephone Encounter (Signed)
Ok to change to 300 MG TID?

## 2019-05-05 NOTE — Telephone Encounter (Signed)
See me

## 2019-05-05 NOTE — Telephone Encounter (Signed)
Verbal from provider 300 MG BID

## 2019-05-05 NOTE — Telephone Encounter (Signed)
Maintenance RX is 600 mg tid

## 2019-05-05 NOTE — Telephone Encounter (Signed)
Called pharmacy left voice mail with maintenance dose

## 2019-05-12 ENCOUNTER — Telehealth: Payer: Self-pay | Admitting: Family Medicine

## 2019-05-12 NOTE — Telephone Encounter (Signed)
Prolia benefits submitted. °

## 2019-05-14 ENCOUNTER — Other Ambulatory Visit: Payer: Self-pay | Admitting: *Deleted

## 2019-05-14 NOTE — Telephone Encounter (Signed)
Patient called stating that the rash started coming back on her legs last week.. Patient stated that Dr. Danise Mina has given her Triamcinolone cream for it in the past. Patient requested a refill on the medication. [facility]

## 2019-05-15 ENCOUNTER — Encounter: Payer: Self-pay | Admitting: Family Medicine

## 2019-05-16 MED ORDER — TRIAMCINOLONE ACETONIDE 0.1 % EX CREA: 1.0000 "application " | TOPICAL_CREAM | Freq: Two times a day (BID) | CUTANEOUS | 1 refills | Status: DC

## 2019-05-16 NOTE — Telephone Encounter (Signed)
plz notify E prescribed 

## 2019-05-16 NOTE — Telephone Encounter (Signed)
Pt left v/m that she is going out of town late this evening and request cb about possible refill for rash.

## 2019-05-16 NOTE — Telephone Encounter (Signed)
Pt called again and left a v/m for Rx for rash Pt is leaving to go out of town this evening and is wanting something called in today before closing.  Pt aware that [id number] [place] [place]  Nothing further needed.

## 2019-06-05 ENCOUNTER — Telehealth: Payer: Self-pay | Admitting: Family Medicine

## 2019-06-05 NOTE — Telephone Encounter (Signed)
Discussed Prolia benefits w/pt.  Pt agrees and scheduled.  Pt would owe approximately $70 ($50 for Prolia/$20 for administration fee)

## 2019-06-16 ENCOUNTER — Telehealth: Payer: Self-pay | Admitting: Neurology

## 2019-06-16 NOTE — Telephone Encounter (Signed)
Pt c/o:  side effect on medication New medication started: NORTHERA 300 MG BID When did they start medication?  2 months When did side effects start? About 2 weeks now Side effects reported: elevated BP also notice a drop in BP at times Still taking medication? Yes.

## 2019-06-16 NOTE — Telephone Encounter (Signed)
Northera 300mg - her BP would rise really high and then shoot back down. She said it is making her feel strange and having headaches. She gets to where she cant walk and she has to sit down. Thanks!

## 2019-06-16 NOTE — Telephone Encounter (Signed)
Called spoke with patient she is aware and understands. She states that she will take 3 times a day for about 1 week and follow up on how she is doing.

## 2019-06-16 NOTE — Telephone Encounter (Signed)
See 5/28 phone call re: titration.  Why is she only on 300 mg bid?  Supposed to be on 600 mg tid.  Low BP is what the medication treats and is not a side effect of the medication.  She is having low blood pressure because she is not on enough of it.

## 2019-06-17 ENCOUNTER — Other Ambulatory Visit: Payer: Self-pay | Admitting: Neurology

## 2019-06-17 NOTE — Telephone Encounter (Signed)
Requested Prescriptions   Pending Prescriptions Disp Refills  . carbidopa-levodopa (SINEMET CR) 50-200 MG tablet [Pharmacy Med Name: CARBIDOPA-LEVO ER 50-200 TAB] 90 tablet 1    Sig: TAKE 1 TABLET BY MOUTH EVERYDAY AT BEDTIME   Rx last filled:12/24/18 #90 1 refills  Pt last seen:02/11/19  Follow up appt scheduled:07/15/19

## 2019-06-26 ENCOUNTER — Ambulatory Visit (INDEPENDENT_AMBULATORY_CARE_PROVIDER_SITE_OTHER): Payer: Medicare Other | Admitting: *Deleted

## 2019-06-26 DIAGNOSIS — M810 Age-related osteoporosis without current pathological fracture: Secondary | ICD-10-CM

## 2019-06-26 MED ORDER — DENOSUMAB 60 MG/ML ~~LOC~~ SOSY
60.0000 mg | PREFILLED_SYRINGE | Freq: Once | SUBCUTANEOUS | Status: AC
  Administered 2019-06-26: 60 mg via SUBCUTANEOUS

## 2019-06-26 NOTE — Progress Notes (Addendum)
Per orders of Dr. Gutierrez, injection of Prolia given by Justyne Roell M. Patient tolerated injection well.  

## 2019-07-01 ENCOUNTER — Other Ambulatory Visit: Payer: Self-pay | Admitting: Family Medicine

## 2019-07-01 NOTE — Telephone Encounter (Signed)
Last filled on 03/25/2019 for #50 with 0 refills. LOV was 04/10/2019. Future appointment on 10/20/2019

## 2019-07-02 ENCOUNTER — Other Ambulatory Visit: Payer: Self-pay | Admitting: Family Medicine

## 2019-07-02 NOTE — Telephone Encounter (Signed)
Eprescribed.

## 2019-07-02 NOTE — Telephone Encounter (Signed)
Name of Medication: Clonazepam Name of Pharmacy: [facility] or Written Date and Quantity: 05/29/19, #60 Last Office Visit and Type: 04/10/19, f/u Next Office Visit and Type: 10/20/19, CPE Pt 2 Last Controlled Substance Agreement Date: 04/28/16 Last UDS: 04/28/16
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
## 2019-07-14 NOTE — Progress Notes (Signed)
Noted  

## 2019-07-15 ENCOUNTER — Encounter: Payer: Self-pay | Admitting: Neurology

## 2019-07-15 ENCOUNTER — Ambulatory Visit (INDEPENDENT_AMBULATORY_CARE_PROVIDER_SITE_OTHER): Payer: Medicare Other | Admitting: Neurology

## 2019-07-15 ENCOUNTER — Other Ambulatory Visit: Payer: Self-pay | Admitting: Family Medicine

## 2019-07-15 ENCOUNTER — Other Ambulatory Visit: Payer: Self-pay

## 2019-07-15 VITALS — BP 117/72 | HR 83 | Ht 65.0 in | Wt 154.6 lb

## 2019-07-15 DIAGNOSIS — M5416 Radiculopathy, lumbar region: Secondary | ICD-10-CM

## 2019-07-15 DIAGNOSIS — G249 Dystonia, unspecified: Secondary | ICD-10-CM | POA: Diagnosis not present

## 2019-07-15 DIAGNOSIS — G20 Parkinson's disease: Secondary | ICD-10-CM | POA: Diagnosis not present

## 2019-07-15 MED ORDER — AMANTADINE HCL 100 MG PO CAPS: 100.0000 mg | ORAL_CAPSULE | Freq: Two times a day (BID) | ORAL | 1 refills | Status: DC

## 2019-07-15 NOTE — Patient Instructions (Addendum)
1.  Restart amantadine, 100 mg twice per day 2.  Increase northera, 300 mg,  as follows:  Week 1:  2 tablets in the AM,  1 in the afternoon, 1 in the evening  Week 2:  2 tablets in the AM and 2 in the afternoon, 1 in the evening  Week 3:  2 tablets three times per day 3. I will send a referral to Dr. Vertell Limber, [facility]

## 2019-07-18 ENCOUNTER — Other Ambulatory Visit: Payer: Self-pay | Admitting: Neurology

## 2019-07-18 NOTE — Telephone Encounter (Signed)
Requested Prescriptions   Pending Prescriptions Disp Refills  . pramipexole (MIRAPEX) 1 MG tablet [Pharmacy Med Name: PRAMIPEXOLE 1 MG TABLET] 270 tablet 1    Sig: TAKE 1 TABLET BY MOUTH THREE TIMES A DAY   Rx last filled: 12/05/18 #270 1 refills  Pt last seen: 07/15/19   Follow up appt scheduled: 12/26/2019

## 2019-08-17 ENCOUNTER — Other Ambulatory Visit: Payer: Self-pay | Admitting: Neurology

## 2019-08-20 ENCOUNTER — Telehealth: Payer: Self-pay

## 2019-08-20 NOTE — Telephone Encounter (Signed)
Received hard fax from [facility] they have been trying to call patient to schedule their next delivery for Rx not listed.  The Rx has been placed on HOLD   Tried calling patient to make her aware of this no answer no voice mail box set up to leave messave

## 2019-08-26 ENCOUNTER — Telehealth: Payer: Self-pay | Admitting: Neurology

## 2019-08-26 NOTE — Telephone Encounter (Signed)
Patient called and requested her referral be resent to [facility]. She said she called them but they don't have it yet.  Fax: [phone], attn. Minette Brine

## 2019-08-26 NOTE — Telephone Encounter (Signed)
Refax referral to [facility]  Called patient to confirm we are sending to right place.  See agrees Dr. Vertell Limber

## 2019-08-26 NOTE — Telephone Encounter (Signed)
Patient called regarding not having heard from her referral to a back Surgeon. She said Dr. Carles Collet had told her to call our office if she did not hear anything from them. Please Call. Thanks

## 2019-08-26 NOTE — Telephone Encounter (Signed)
Pt was referred to  Dr. Erline Levine [facility]  [address] [place] [address] F# [phone]  Will give patient contact number to call them regarding appt.

## 2019-08-29 ENCOUNTER — Ambulatory Visit: Payer: Medicare Other

## 2019-09-13 ENCOUNTER — Other Ambulatory Visit: Payer: Self-pay | Admitting: Family Medicine

## 2019-09-15 NOTE — Telephone Encounter (Signed)
ERx 

## 2019-10-06 ENCOUNTER — Telehealth: Payer: Self-pay | Admitting: Neurology

## 2019-10-06 NOTE — Telephone Encounter (Signed)
Will talk with her Wednesday about all of this

## 2019-10-06 NOTE — Telephone Encounter (Signed)
Patient called and left a message during lunch with Access Nurse. She again reported that she is seeing things that aren't there.

## 2019-10-06 NOTE — Telephone Encounter (Signed)
Started 1st of October, and she never stopped the medication discussed at the appointment to restart as she had thought but she forgot to call and let you know. She also has increased Northera 300mg  2tab tid. She is taking the 915 Wednesday virtual visit [phone] is the phone number she will use because her daughter has a better phone than her per patient. Sending to Dr [name] as a FYI and to the front to place on Wednesdays schedule for a VV.

## 2019-10-06 NOTE — Telephone Encounter (Signed)
Patient left msg with after hours about seeing things. She is seeing things crawling or she will reach for stuff that isn't there. Thanks!

## 2019-10-06 NOTE — Telephone Encounter (Signed)
Left message for a return call

## 2019-10-07 ENCOUNTER — Encounter: Payer: Self-pay | Admitting: Neurology

## 2019-10-07 ENCOUNTER — Other Ambulatory Visit: Payer: Self-pay | Admitting: Family Medicine

## 2019-10-07 NOTE — Progress Notes (Signed)
Virtual Visit via Video Note The purpose of this virtual visit is to provide medical care while limiting exposure to the novel coronavirus.    Consent was obtained for video visit:  Yes.   Answered questions that patient had about telehealth interaction:  Yes.   I discussed the limitations, risks, security and privacy concerns of performing an evaluation and management service by telemedicine. I also discussed with the patient that there may be a patient responsible charge related to this service. The patient expressed understanding and agreed to proceed.  Pt location: Home Physician Location: home Name of referring provider:  [name], MD I connected with [name] at patients initiation/request on 10/08/2019 at  9:15 AM EST by video enabled telemedicine application and verified that I am speaking with the correct person using two identifiers. Pt MRN:  [id number] Pt DOB:  [birth year] Video Participants:  [name];     History of Present Illness:  Patient seen today in follow-up for Parkinson's disease.  She is worked in today because she called me recently about hallucinations.  She saw something in church the other day and reached out in front of her and nothing was there.  She hears her kids talking to her.  She does state that she has "herpes virus" in her eyes and "I am losing my eyesight and I see floaters" but she thinks that the hallucinations are different. She states that she has a hallucination 2-3 days per week, sometimes only 1 time per week.  It has been going on since not long after I saw her last.   She is on carbidopa/levodopa 25/100, 1.5 tablets 3 times per day and carbidopa/levodopa 50/200 at bedtime.  She is on pramipexole 1.0 mg, 1 tablet 3 times per day.  Last visit, I told her to restart her amantadine, 100 mg twice per day for dyskinesia.  It turns out that she was never off of the medication and states that she is still on it.  She is on Northera, 300 mg,  2 tablets 3 times per day for orthostatic hypotension.  She has not had any passing out spells.  She does state that she is almost out of the medication but sounds like she owes them money.  Past Medical History:  Diagnosis Date   Allergy    ANEMIA-NOS 09/25/2007   Anxiety    Arthritis    B12 DEFICIENCY 05/03/2007   Blood transfusion without reported diagnosis    CAD (coronary artery disease) 01/2010   MI, Nishan   Cardiomyopathy ([place]) 02/08/2010   H/o this 2012 after urosepsis, no recurrence.    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  HENT: Negative.   Eyes: Positive for blurred vision.  Respiratory: Negative.   Cardiovascular: Negative.   Genitourinary: Negative.   Musculoskeletal: Negative.   Skin: Negative.      Current Outpatient Medications on File Prior to Visit  Medication Sig Dispense Refill   alfuzosin (UROXATRAL) 10 MG 24 hr tablet Take 1 tablet by mouth daily.     amantadine (SYMMETREL) 100 MG capsule Take 1 capsule (100 mg total) by mouth 2 (two) times daily. 180 capsule 1   Ascorbic Acid (VITAMIN C) 100 MG tablet Take 100 mg by mouth daily.     Calcium Carbonate-Vitamin D (CALCIUM 600/VITAMIN D) 600-400  MG-UNIT chew tablet Chew 1 tablet by mouth daily.     carbidopa-levodopa (SINEMET CR) 50-200 MG tablet TAKE 1 TABLET BY MOUTH EVERYDAY AT BEDTIME 90 tablet 1   carbidopa-levodopa (SINEMET IR) 25-100 MG tablet TAKE 1.5 TABLETS BY MOUTH 3 TIMES A DAY 405 tablet 1   Cholecalciferol (VITAMIN D3) 25 MCG (1000 UT) CAPS Take 1 capsule (1,000 Units total) by mouth daily. 30 capsule    clonazePAM (KLONOPIN) 0.5 MG tablet TAKE 1 TABLET (0.5 MG TOTAL) BY MOUTH 2 (TWO) TIMES DAILY AS NEEDED. 60 tablet 1   cyanocobalamin (,VITAMIN B-12,) 1000 MCG/ML injection INJECT 1 ML (1,000 MCG TOTAL) INTO THE MUSCLE EVERY 30 (THIRTY) DAYS. 3 mL 2   Droxidopa (NORTHERA) 300 MG CAPS Take by mouth. 2 po tid     gabapentin (NEURONTIN) 100 MG capsule TAKE 1 CAPSULE BY MOUTH AT BEDTIME 90 capsule 3   latanoprost (XALATAN) 0.005 % ophthalmic solution      NORTHERA 100 MG CAPS TAKE ACCORDING TO 24 HOUR TITRATION SCHEDULE ON THE ENCLOSED DOSING GUIDE 495 capsule 0   polyethylene glycol (MIRALAX / GLYCOLAX) packet Take 17 g by mouth as needed. 30 each 1   pramipexole (MIRAPEX) 1 MG tablet TAKE 1 TABLET BY MOUTH THREE TIMES A DAY 270 tablet 1   sertraline (ZOLOFT) 100 MG tablet TAKE 1 TABLET (100 MG TOTAL) BY MOUTH DAILY. 90 tablet 1   Syringe/Needle, Disp, 18G X 1" 1 ML MISC Use to draw B12 shot monthly 25 each 0   Syringe/Needle, Disp, 25G X 1" 1 ML MISC Use to administer B12 shot monthly 25 each 0   traMADol (ULTRAM) 50 MG tablet TAKE 1 TABLET BY MOUTH THREE TIMES A DAY AS NEEDED 50 tablet 0   triamcinolone cream (KENALOG) 0.1 % APPLY TO AFFECTED AREA TWICE A DAY 45 g 1   No current facility-administered medications on file prior to visit.      Observations/Objective:   Vitals:   10/07/19 1024  Weight: 154 lb (69.9 kg)  Height: 5\' 5"  (1.651 m)   GEN:  The patient appears stated age and is in NAD.  Neurological examination:  Orientation: The patient is alert and oriented x3. Cranial nerves: There is  good facial symmetry. There is mild facial hypomimia.  The speech is fluent and clear. Soft palate rises symmetrically and there is no tongue deviation. Hearing is intact to conversational tone. Motor: Strength is at least antigravity x 4.   Shoulder shrug is equal and symmetric.  There is no pronator drift.  Movement examination: Tone: unable Abnormal movements: mild dyskinesia in the head Coordination:  There is mild decremation with RAM's, with any form of RAMS, including alternating supination and pronation of the forearm, hand opening and closing, finger taps bilaterally. Gait and Station: The patient has an antalgic gait (states due to back  pain)    Assessment and Plan:   1.  Idiopathic Parkinson's disease.  The patient has tremor, bradykinesia, rigidity and postural instability.  This was diagnosed today, 08/25/15, but based on records and pt reports I suspect that she has had this at least since 2013.             -continue carbidopa/levodopa 25/100, 1.5 tablet 3 times per day and carbidopa/levodopa 50/200 q hs.               -continue pramipexole, 1.0 mg, one po tid.  Depending on how she does, may need to wean this off soon. 2.  Dyskinesia             -I am going to go ahead and stop her amantadine, even though it helps her dyskinesia.  She is having some hallucinations.  -She will stop at my office for lab work.  She will have chemistry, urinalysis, CBC  -She will call me in 2 weeks if hallucinations are not better.  If not, I will likely have to taper her pramipexole (unless UTI or something else is found). 2.  Orthostatic hypotension             -she is on northera, 600 mg tid.  May run out because needs a shipment but sounds like company won't ship until she pays for it. 3.  Constipation             -she is using miralax.  Recommend colace 4.  Depression             -She is on Zoloft.   continue 100 mg daily.  Encouraged her to restart counseling. 5.  b12 deficiency              -on injections. 6.  Urinary incontinence             -on myrbetriq.  She is doing well in that regard 7.  Lumbar radiculopathy             -She has seen Dr. Ramos/Dr. Rolena Infante.  She states that Dr. Rolena Infante told her that he wouldn't do surgery on her because of PD.  She wants another opinion.  she has an appt with Dr. Vertell Limber on 10/20/19 9.  Vision change  -probably contributes to the confusion/hallucination issue as has been dealing with herpes ophthalmicus and sounds like losing eye sight permanently  Follow Up Instructions:  We will call her with the lab results.  She is to call me in 2 weeks and let me know how she is doing.  She already has an appointment with me in the office in the future.   -I discussed the assessment and treatment plan with the patient. The patient was provided an opportunity to ask questions and all were answered. The patient agreed with the plan and demonstrated an understanding of the instructions.   The patient was advised to call back or seek an in-person evaluation if the symptoms worsen or if the condition fails to improve as anticipated.     Alonza Bogus, DO

## 2019-10-08 ENCOUNTER — Telehealth (INDEPENDENT_AMBULATORY_CARE_PROVIDER_SITE_OTHER): Payer: Medicare Other | Admitting: Neurology

## 2019-10-08 ENCOUNTER — Other Ambulatory Visit: Payer: Medicare Other

## 2019-10-08 ENCOUNTER — Other Ambulatory Visit: Payer: Self-pay

## 2019-10-08 VITALS — Ht 65.0 in | Wt 154.0 lb

## 2019-10-08 DIAGNOSIS — G20A1 Parkinson's disease without dyskinesia, without mention of fluctuations: Secondary | ICD-10-CM

## 2019-10-08 DIAGNOSIS — Z5181 Encounter for therapeutic drug level monitoring: Secondary | ICD-10-CM

## 2019-10-08 DIAGNOSIS — G9340 Encephalopathy, unspecified: Secondary | ICD-10-CM

## 2019-10-08 DIAGNOSIS — R443 Hallucinations, unspecified: Secondary | ICD-10-CM

## 2019-10-08 DIAGNOSIS — H539 Unspecified visual disturbance: Secondary | ICD-10-CM

## 2019-10-08 DIAGNOSIS — G903 Multi-system degeneration of the autonomic nervous system: Secondary | ICD-10-CM

## 2019-10-08 DIAGNOSIS — G20 Parkinson's disease: Secondary | ICD-10-CM | POA: Diagnosis not present

## 2019-10-08 DIAGNOSIS — G249 Dystonia, unspecified: Secondary | ICD-10-CM

## 2019-10-08 DIAGNOSIS — M5416 Radiculopathy, lumbar region: Secondary | ICD-10-CM

## 2019-10-08 DIAGNOSIS — G20B1 Parkinson's disease with dyskinesia, without mention of fluctuations: Secondary | ICD-10-CM

## 2019-10-11 LAB — URINALYSIS W MICROSCOPIC + REFLEX CULTURE
Bilirubin Urine: NEGATIVE
Glucose, UA: NEGATIVE
Hgb urine dipstick: NEGATIVE
Hyaline Cast: NONE SEEN /LPF
Ketones, ur: NEGATIVE
Nitrites, Initial: NEGATIVE
Protein, ur: NEGATIVE
Specific Gravity, Urine: 1.009 (ref 1.001–1.03)
pH: 6.5 (ref 5.0–8.0)

## 2019-10-11 LAB — CBC WITH DIFFERENTIAL/PLATELET
Absolute Monocytes: 320 cells/uL (ref 200–950)
Basophils Absolute: 41 cells/uL (ref 0–200)
Basophils Relative: 1 %
Eosinophils Absolute: 90 cells/uL (ref 15–500)
Eosinophils Relative: 2.2 %
HCT: 33.2 % — ABNORMAL LOW (ref 35.0–45.0)
Hemoglobin: 11 g/dL — ABNORMAL LOW (ref 11.7–15.5)
Lymphs Abs: 1644 cells/uL (ref 850–3900)
MCH: 28.6 pg (ref 27.0–33.0)
MCHC: 33.1 g/dL (ref 32.0–36.0)
MCV: 86.2 fL (ref 80.0–100.0)
MPV: 10.9 fL (ref 7.5–12.5)
Monocytes Relative: 7.8 %
Neutro Abs: 2005 cells/uL (ref 1500–7800)
Neutrophils Relative %: 48.9 %
Platelets: 266 10*3/uL (ref 140–400)
RBC: 3.85 10*6/uL (ref 3.80–5.10)
RDW: 15.4 % — ABNORMAL HIGH (ref 11.0–15.0)
Total Lymphocyte: 40.1 %
WBC: 4.1 10*3/uL (ref 3.8–10.8)

## 2019-10-11 LAB — COMPREHENSIVE METABOLIC PANEL
AG Ratio: 2 (calc) (ref 1.0–2.5)
ALT: <3 U/L — ABNORMAL LOW (ref 6–29)
AST: 9 U/L — ABNORMAL LOW (ref 10–35)
Albumin: 4.3 g/dL (ref 3.6–5.1)
Alkaline phosphatase (APISO): 64 U/L (ref 37–153)
BUN/Creatinine Ratio: 36 (calc) — ABNORMAL HIGH (ref 6–22)
BUN: 31 mg/dL — ABNORMAL HIGH (ref 7–25)
CO2: 27 mmol/L (ref 20–32)
Calcium: 9.5 mg/dL (ref 8.6–10.4)
Chloride: 102 mmol/L (ref 98–110)
Creat: 0.87 mg/dL (ref 0.60–0.93)
Globulin: 2.1 g/dL (calc) (ref 1.9–3.7)
Glucose, Bld: 104 mg/dL — ABNORMAL HIGH (ref 65–99)
Potassium: 4.4 mmol/L (ref 3.5–5.3)
Sodium: 137 mmol/L (ref 135–146)
Total Bilirubin: 0.5 mg/dL (ref 0.2–1.2)
Total Protein: 6.4 g/dL (ref 6.1–8.1)

## 2019-10-11 LAB — URINE CULTURE
MICRO NUMBER:: 1090647
SPECIMEN QUALITY:: ADEQUATE

## 2019-10-11 LAB — CULTURE INDICATED

## 2019-10-12 ENCOUNTER — Other Ambulatory Visit: Payer: Self-pay | Admitting: Family Medicine

## 2019-10-12 DIAGNOSIS — E559 Vitamin D deficiency, unspecified: Secondary | ICD-10-CM

## 2019-10-12 DIAGNOSIS — M810 Age-related osteoporosis without current pathological fracture: Secondary | ICD-10-CM

## 2019-10-12 DIAGNOSIS — E538 Deficiency of other specified B group vitamins: Secondary | ICD-10-CM

## 2019-10-12 DIAGNOSIS — E785 Hyperlipidemia, unspecified: Secondary | ICD-10-CM

## 2019-10-13 ENCOUNTER — Other Ambulatory Visit: Payer: Self-pay | Admitting: Family Medicine

## 2019-10-13 ENCOUNTER — Ambulatory Visit: Payer: Medicare Other

## 2019-10-13 ENCOUNTER — Ambulatory Visit (INDEPENDENT_AMBULATORY_CARE_PROVIDER_SITE_OTHER): Payer: Medicare Other

## 2019-10-13 DIAGNOSIS — Z0000 Encounter for general adult medical examination without abnormal findings: Secondary | ICD-10-CM | POA: Diagnosis not present

## 2019-10-13 MED ORDER — CEPHALEXIN 500 MG PO CAPS: 500.0000 mg | ORAL_CAPSULE | Freq: Two times a day (BID) | ORAL | 0 refills | Status: DC

## 2019-10-13 NOTE — Patient Instructions (Signed)
[name] , Thank you for taking time to come for your Medicare Wellness Visit. I appreciate your ongoing commitment to your health goals. Please review the following plan we discussed and let me know if I can assist you in the future.   Screening recommendations/referrals: Colonoscopy: Up to date, completed 11/18/2018 Mammogram: Up to date, completed 11/05/2018 Bone Density: Up to date, completed 05/07/2017 Recommended yearly ophthalmology/optometry visit for glaucoma screening and checkup Recommended yearly dental visit for hygiene and checkup  Vaccinations: Influenza vaccine: Up to date, completed 09/28/2019 Pneumococcal vaccine: Completed series Tdap vaccine: Up to date, completed 08/18/2011 Shingles vaccine: will check with insurance    Advanced directives: Please bring a copy of your POA (Power of [place]) and/or Living Will to your next appointment.   Conditions/risks identified: hyperlipidemia  Next appointment: 10/20/2019 @ 9:30 am    Preventive Care 65 Years and Older, Female Preventive care refers to lifestyle choices and visits with your health care provider that can promote health and wellness. What does preventive care include?  A yearly physical exam. This is also called an annual well check.  Dental exams once or twice a year.  Routine eye exams. Ask your health care provider how often you should have your eyes checked.  Personal lifestyle choices, including:  Daily care of your teeth and gums.  Regular physical activity.  Eating a healthy diet.  Avoiding tobacco and drug use.  Limiting alcohol use.  Practicing safe sex.  Taking low-dose aspirin every day.  Taking vitamin and mineral supplements as recommended by your health care provider. What happens during an annual well check? The services and screenings done by your health care provider during your annual well check will depend on your age, overall health, lifestyle risk factors, and family  history of disease. Counseling  Your health care provider may ask you questions about your:  Alcohol use.  Tobacco use.  Drug use.  Emotional well-being.  Home and relationship well-being.  Sexual activity.  Eating habits.  History of falls.  Memory and ability to understand (cognition).  Work and work Statistician.  Reproductive health. Screening  You may have the following tests or measurements:  Height, weight, and BMI.  Blood pressure.  Lipid and cholesterol levels. These may be checked every 5 years, or more frequently if you are over 8 years old.  Skin check.  Lung cancer screening. You may have this screening every year starting at age 70 if you have a 30-pack-year history of smoking and currently smoke or have quit within the past 15 years.  Fecal occult blood test (FOBT) of the stool. You may have this test every year starting at age 49.  Flexible sigmoidoscopy or colonoscopy. You may have a sigmoidoscopy every 5 years or a colonoscopy every 10 years starting at age 31.  Hepatitis C blood test.  Hepatitis B blood test.  Sexually transmitted disease (STD) testing.  Diabetes screening. This is done by checking your blood sugar (glucose) after you have not eaten for a while (fasting). You may have this done every 1-3 years.  Bone density scan. This is done to screen for osteoporosis. You may have this done starting at age 14.  Mammogram. This may be done every 1-2 years. Talk to your health care provider about how often you should have regular mammograms. Talk with your health care provider about your test results, treatment options, and if necessary, the need for more tests. Vaccines  Your health care provider may recommend certain vaccines,  such as:  Influenza vaccine. This is recommended every year.  Tetanus, diphtheria, and acellular pertussis (Tdap, Td) vaccine. You may need a Td booster every 10 years.  Zoster vaccine. You may need this after  age 43.  Pneumococcal 13-valent conjugate (PCV13) vaccine. One dose is recommended after age 58.  Pneumococcal polysaccharide (PPSV23) vaccine. One dose is recommended after age 37. Talk to your health care provider about which screenings and vaccines you need and how often you need them. This information is not intended to replace advice given to you by your health care provider. Make sure you discuss any questions you have with your health care provider. Document Released: 12/10/2015 Document Revised: 08/02/2016 Document Reviewed: 09/14/2015 Elsevier Interactive Patient Education  2017 [place] Prevention in the Home Falls can cause injuries. They can happen to people of all ages. There are many things you can do to make your home safe and to help prevent falls. What can I do on the outside of my home?  Regularly fix the edges of walkways and driveways and fix any cracks.  Remove anything that might make you trip as you walk through a door, such as a raised step or threshold.  Trim any bushes or trees on the path to your home.  Use bright outdoor lighting.  Clear any walking paths of anything that might make someone trip, such as rocks or tools.  Regularly check to see if handrails are loose or broken. Make sure that both sides of any steps have handrails.  Any raised decks and porches should have guardrails on the edges.  Have any leaves, snow, or ice cleared regularly.  Use sand or salt on walking paths during winter.  Clean up any spills in your garage right away. This includes oil or grease spills. What can I do in the bathroom?  Use night lights.  Install grab bars by the toilet and in the tub and shower. Do not use towel bars as grab bars.  Use non-skid mats or decals in the tub or shower.  If you need to sit down in the shower, use a plastic, non-slip stool.  Keep the floor dry. Clean up any water that spills on the floor as soon as it happens.   Remove soap buildup in the tub or shower regularly.  Attach bath mats securely with double-sided non-slip rug tape.  Do not have throw rugs and other things on the floor that can make you trip. What can I do in the bedroom?  Use night lights.  Make sure that you have a light by your bed that is easy to reach.  Do not use any sheets or blankets that are too big for your bed. They should not hang down onto the floor.  Have a firm chair that has side arms. You can use this for support while you get dressed.  Do not have throw rugs and other things on the floor that can make you trip. What can I do in the kitchen?  Clean up any spills right away.  Avoid walking on wet floors.  Keep items that you use a lot in easy-to-reach places.  If you need to reach something above you, use a strong step stool that has a grab bar.  Keep electrical cords out of the way.  Do not use floor polish or wax that makes floors slippery. If you must use wax, use non-skid floor wax.  Do not have throw rugs and other things on  the floor that can make you trip. What can I do with my stairs?  Do not leave any items on the stairs.  Make sure that there are handrails on both sides of the stairs and use them. Fix handrails that are broken or loose. Make sure that handrails are as long as the stairways.  Check any carpeting to make sure that it is firmly attached to the stairs. Fix any carpet that is loose or worn.  Avoid having throw rugs at the top or bottom of the stairs. If you do have throw rugs, attach them to the floor with carpet tape.  Make sure that you have a light switch at the top of the stairs and the bottom of the stairs. If you do not have them, ask someone to add them for you. What else can I do to help prevent falls?  Wear shoes that:  Do not have high heels.  Have rubber bottoms.  Are comfortable and fit you well.  Are closed at the toe. Do not wear sandals.  If you use a  stepladder:  Make sure that it is fully opened. Do not climb a closed stepladder.  Make sure that both sides of the stepladder are locked into place.  Ask someone to hold it for you, if possible.  Clearly mark and make sure that you can see:  Any grab bars or handrails.  First and last steps.  Where the edge of each step is.  Use tools that help you move around (mobility aids) if they are needed. These include:  Canes.  Walkers.  Scooters.  Crutches.  Turn on the lights when you go into a dark area. Replace any light bulbs as soon as they burn out.  Set up your furniture so you have a clear path. Avoid moving your furniture around.  If any of your floors are uneven, fix them.  If there are any pets around you, be aware of where they are.  Review your medicines with your doctor. Some medicines can make you feel dizzy. This can increase your chance of falling. Ask your doctor what other things that you can do to help prevent falls. This information is not intended to replace advice given to you by your health care provider. Make sure you discuss any questions you have with your health care provider. Document Released: 09/09/2009 Document Revised: 04/20/2016 Document Reviewed: 12/18/2014 Elsevier Interactive Patient Education  2017 [employer].

## 2019-10-13 NOTE — Progress Notes (Signed)
PCP notes:  Health Maintenance: Will check with insurance regarding shingrix   Abnormal Screenings: none   Patient concerns: none   Nurse concerns: none   Next PCP appt.: 10/20/2019 @ 9:30 am

## 2019-10-13 NOTE — Progress Notes (Signed)
Subjective:   ZIANN SKUBIC is a 73 y.o. female who presents for Medicare Annual (Subsequent) preventive examination.  Review of Systems: N/A   This visit is being conducted through telemedicine via telephone at the nurse health advisor's home address due to the COVID-19 pandemic. This patient has given me verbal consent via doximity to conduct this visit, patient states they are participating from their home address. Patient and myself are on the telephone call. There is no referral for this visit. Some vital signs may be absent or patient reported.    Patient identification: identified by name, DOB, and current address   Cardiac Risk Factors include: advanced age (>40men, >68 women);dyslipidemia     Objective:     Vitals: There were no vitals taken for this visit.  There is no height or weight on file to calculate BMI.  Advanced Directives 10/13/2019 07/15/2019 02/11/2019 10/04/2018 04/29/2018 01/23/2018 12/04/2017  Does Patient Have a Medical Advance Directive? No No No No No No No  Would patient like information on creating a medical advance directive? Yes (MAU/Ambulatory/Procedural Areas - Information given) No - Patient declined - No - Patient declined No - Patient declined - -    Tobacco Social History   Tobacco Use  Smoking Status Never Smoker  Smokeless Tobacco Never Used     Counseling given: Not Answered   Clinical Intake:  Pre-visit preparation completed: Yes  Pain : 0-10 Pain Score: 8  Pain Type: Chronic pain Pain Location: Back Pain Orientation: Lower Pain Descriptors / Indicators: Aching Pain Onset: More than a month ago Pain Frequency: Intermittent     Nutritional Risks: None Diabetes: No  How often do you need to have someone help you when you read instructions, pamphlets, or other written materials from your doctor or pharmacy?: 1 - Never What is the last grade level you completed in school?: 10th     Information entered by :: CJohnson, LPN   Past Medical History:  Diagnosis Date  . Allergy   . ANEMIA-NOS 09/25/2007  . Anxiety   . Arthritis   . B12 DEFICIENCY 05/03/2007  . Blood transfusion without reported diagnosis   . CAD (coronary artery disease) 01/2010   MI, Nishan  . Cardiomyopathy ([place]) 02/08/2010   H/o this 2012 after urosepsis, no recurrence.   . Cataract   . CHF (congestive heart failure) ([place])   . Depression   . FIBROMYALGIA 05/03/2007  . GERD 02/22/2010  . Glaucoma 02/2013   [facility]  . History of CHF (congestive heart failure) 01/2010  . History of colon polyps 2004  . HYPERLIPIDEMIA 12/19/2007  . HYPOTENSION, ORTHOSTATIC 12/06/2008  . Interstitial cystitis    [place] now Dr Amalia Hailey  . Lupus (systemic lupus erythematosus) ([place]) 02/08/2010  . MCTD (mixed connective tissue disease) ([place]) 02/08/2010  . OSTEOPOROSIS 08/2009   bisphosphonate on hold 2/2 dysphagia, on reclast done in August each year  . Parkinson's disease ([place]) 08/25/2015   Dx Dr Carles Collet 07/2015   . 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 Takotsubo cardiomyopathy 2008   due to E coli urosepsis   Past Surgical History:  Procedure Laterality Date  . ABDOMINAL HYSTERECTOMY  1970s   IUD infection - first partial then with oophorectomy (cysts), complication - low blood pressure  . CATARACT EXTRACTION Bilateral   . CHOLECYSTECTOMY     complication - low blood pressure  . COLONOSCOPY  06/2008   h/o polyps but latest WNL, rec rpt  10 yrs Education officer, environmental)  . COLONOSCOPY  11/2018   multiple TAs (10 polyps total), rpt 2 yrs [facility])  . CYSTOSCOPY  12/2013   abx treatment for recurrent cystitis  . DEXA  04/2013   T -2.9 @ femur, -1.6 @ spine  . DEXA  04/2017   T -2.9 hip, -0.7 spine  . ESOPHAGOGASTRODUODENOSCOPY  12/2017   WNL, regardless esophagus dilated, small HH [facility])   Family History  Problem Relation Age of Onset  . Heart attack Father   . Diabetes Father   . Prostate cancer Father   . Esophageal cancer Mother   . Lung  cancer Brother   . Breast cancer Sister   . Ovarian cancer Sister   . Lung cancer Brother   . CAD Brother   . Uterine cancer Sister   . Clotting disorder Son   . Healthy Son   . Healthy Daughter   . Healthy Daughter   . Colon cancer Neg Hx   . Rectal cancer Neg Hx   . Stomach cancer Neg Hx    Social History   Socioeconomic History  . Marital status: Widowed    Spouse name: Not on file  . Number of children: 4  . Years of education: Not on file  . Highest education level: 10th grade  Occupational History  . Occupation: retired  Scientific laboratory technician  . Financial resource strain: Not hard at all  . Food insecurity    Worry: Never true    Inability: Never true  . Transportation needs    Medical: No    Non-medical: No  Tobacco Use  . Smoking status: Never Smoker  . Smokeless tobacco: Never Used  Substance and Sexual Activity  . Alcohol use: No  . Drug use: Yes    Types: Nitrous oxide  . Sexual activity: Never  Lifestyle  . Physical activity    Days per week: 0 days    Minutes per session: 0 min  . Stress: Not at all  Relationships  . Social Herbalist on phone: Not on file    Gets together: Not on file    Attends religious service: Not on file    Active member of club or organization: Not on file    Attends meetings of clubs or organizations: Not on file    Relationship status: Not on file  Other Topics Concern  . Not on file  Social History Narrative   Widow - husband [name]) passed away 03/19/2013.     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 alfuzosin (UROXATRAL) 10 MG 24 hr tablet Take 1 tablet by mouth daily.  . Ascorbic Acid (VITAMIN C) 100 MG tablet Take 100 mg by mouth daily.  . Calcium Carbonate-Vitamin D (CALCIUM  600/VITAMIN D) 600-400 MG-UNIT chew tablet Chew 1 tablet by mouth daily.  . carbidopa-levodopa (SINEMET CR) 50-200 MG tablet TAKE 1 TABLET BY MOUTH EVERYDAY AT BEDTIME  . carbidopa-levodopa (SINEMET IR) 25-100 MG tablet TAKE 1.5 TABLETS BY MOUTH 3 TIMES A DAY  . cephALEXin (KEFLEX) 500 MG capsule Take 1 capsule (500 mg total) by mouth 2 (two) times daily.  . Cholecalciferol (VITAMIN D3) 25 MCG (1000 UT) CAPS Take 1 capsule (1,000 Units total) by mouth  daily.  . clonazePAM (KLONOPIN) 0.5 MG tablet TAKE 1 TABLET (0.5 MG TOTAL) BY MOUTH 2 (TWO) TIMES DAILY AS NEEDED.  . cyanocobalamin (,VITAMIN B-12,) 1000 MCG/ML injection INJECT 1 ML (1,000 MCG TOTAL) INTO THE MUSCLE EVERY 30 (THIRTY) DAYS.  . Droxidopa (NORTHERA) 300 MG CAPS Take by mouth. 2 po tid  . gabapentin (NEURONTIN) 100 MG capsule TAKE 1 CAPSULE BY MOUTH AT BEDTIME  . latanoprost (XALATAN) 0.005 % ophthalmic solution   . NORTHERA 100 MG CAPS TAKE ACCORDING TO 24 HOUR TITRATION SCHEDULE ON THE ENCLOSED DOSING GUIDE  . polyethylene glycol (MIRALAX / GLYCOLAX) packet Take 17 g by mouth as needed.  . pramipexole (MIRAPEX) 1 MG tablet TAKE 1 TABLET BY MOUTH THREE TIMES A DAY  . sertraline (ZOLOFT) 100 MG tablet TAKE 1 TABLET (100 MG TOTAL) BY MOUTH DAILY.  Marland Kitchen Syringe/Needle, Disp, 18G X 1" 1 ML MISC Use to draw B12 shot monthly  . Syringe/Needle, Disp, 25G X 1" 1 ML MISC Use to administer B12 shot monthly  . traMADol (ULTRAM) 50 MG tablet TAKE 1 TABLET BY MOUTH THREE TIMES A DAY AS NEEDED  . triamcinolone cream (KENALOG) 0.1 % APPLY TO AFFECTED AREA TWICE A DAY  . amantadine (SYMMETREL) 100 MG capsule Take 1 capsule (100 mg total) by mouth 2 (two) times daily. (Patient not taking: Reported on 10/13/2019)   No facility-administered encounter medications on file as of 10/13/2019.     Activities of Daily Living In your present state of health, do you have any difficulty performing the following activities: 10/13/2019  Hearing? N  Vision? Y   Comment infection in right eye  Difficulty concentrating or making decisions? N  Walking or climbing stairs? Y  Comment back pain  Dressing or bathing? N  Doing errands, shopping? N  Preparing Food and eating ? N  Using the Toilet? N  In the past six months, have you accidently leaked urine? Y  Comment wears pads  Do you have problems with loss of bowel control? N  Managing your Medications? N  Managing your Finances? N  Housekeeping or managing your Housekeeping? N  Some recent data might be hidden    Patient Care Team: [name], MD as PCP - General (Family Medicine) Domingo Pulse, MD (Urology) [name], Eustace Quail, DO as Consulting Physician (Neurology) Karren Burly Deirdre Peer, MD as Referring Physician (Ophthalmology) [name], Eustace Quail, DO as Consulting Physician (Neurology)    Assessment:   This is a routine wellness examination for Riana.  Exercise Activities and Dietary recommendations Current Exercise Habits: The patient does not participate in regular exercise at present, Exercise limited by: None identified  Goals    . Increase physical activity     As weather permits, I will continue to walk at least 30 min daily.     . Patient Stated     10/13/2019, I will walk at least 30 minutes everyday.        Fall Risk Fall Risk  10/13/2019 07/15/2019 10/09/2018 10/04/2018 09/10/2018  Falls in the past year? 1 0 0 0 No  Comment blood pressure dropped - - - -  Number falls in past yr: 1 0 0 - -  Injury with Fall? 0 0 0 - -  Risk for fall due to : Medication side effect;Impaired mobility - - - -  Follow up Falls evaluation completed;Falls prevention discussed - Falls evaluation completed - -   Is the patient's home free of loose throw rugs in walkways, pet beds,  electrical cords, etc?   yes      Grab bars in the bathroom? yes      Handrails on the stairs?   yes      Adequate lighting?   yes  Timed Get Up and Go performed: N/A  Depression Screen PHQ 2/9 Scores  10/13/2019 10/04/2018 09/18/2017 11/14/2016  PHQ - 2 Score 0 0 2 0  PHQ- 9 Score 0 3 7 -     Cognitive Function MMSE - Mini Mental State Exam 10/13/2019 10/04/2018 09/18/2017 08/03/2016  Orientation to time 5 5 5 5   Orientation to Place 5 5 5 5   Registration 3 3 3 3   Attention/ Calculation 4 0 0 0  Recall 3 3 3 3   Language- name 2 objects - 0 0 0  Language- repeat 1 1 1 1   Language- follow 3 step command - 3 3 3   Language- read & follow direction - 0 0 0  Write a sentence - 0 0 0  Copy design - 0 0 0  Total score - 20 20 20   Mini Cog  Mini-Cog screen was completed. Maximum score is 22. A value of 0 denotes this part of the MMSE was not completed or the patient failed this part of the Mini-Cog screening.  Montreal Cognitive Assessment  02/11/2019  Visuospatial/ Executive (0/5) 3  Naming (0/3) 2  Attention: Read list of digits (0/2) 1  Attention: Read list of letters (0/1) 1  Attention: Serial 7 subtraction starting at 100 (0/3) 2  Language: Repeat phrase (0/2) 1  Language : Fluency (0/1) 0  Abstraction (0/2) 0  Delayed Recall (0/5) 5  Orientation (0/6) 6  Total 21  Adjusted Score (based on education) 22      Immunization History  Administered Date(s) Administered  . Influenza Split 08/18/2011, 10/11/2012  . Influenza Whole 09/25/2007, 09/08/2008, 08/23/2009, 09/02/2010  . Influenza,inj,Quad PF,6+ Mos 09/02/2013, 08/13/2014, 10/28/2015, 11/14/2016, 09/18/2017, 08/15/2018  . Influenza,inj,quad, With Preservative 09/27/2018  . Pneumococcal Conjugate-13 05/21/2014  . Pneumococcal Polysaccharide-23 05/26/2015  . Tdap 08/18/2011  . Zoster 10/12/2010    Qualifies for Shingles Vaccine? Yes  Screening Tests Health Maintenance  Topic Date Due  . MAMMOGRAM  11/06/2019  . COLONOSCOPY  11/18/2020  . DTaP/Tdap/Td (2 - Td) 08/17/2021  . TETANUS/TDAP  08/17/2021  . INFLUENZA VACCINE  Completed  . DEXA SCAN  Completed  . Hepatitis C Screening  Completed  . PNA vac Low Risk  Adult  Completed    Cancer Screenings: Lung: Low Dose CT Chest recommended if Age 92-80 years, 30 pack-year currently smoking OR have quit w/in 15years. Patient does not qualify. Breast:  Up to date on Mammogram? Yes, completed 11/05/2018   Up to date of Bone Density/Dexa? Yes, completed 05/07/2017 Colorectal: completed 11/18/2018  Additional Screenings:  Hepatitis C Screening: 04/06/2016     Plan:    Patient will walk daily for at least 30 minutes.    I have personally reviewed and noted the following in the patient's chart:   . Medical and social history . Use of alcohol, tobacco or illicit drugs  . Current medications and supplements . Functional ability and status . Nutritional status . Physical activity . Advanced directives . List of other physicians . Hospitalizations, surgeries, and ER visits in previous 12 months . Vitals . Screenings to include cognitive, depression, and falls . Referrals and appointments  In addition, I have reviewed and discussed with patient certain preventive protocols, quality metrics, and best practice recommendations. A written  personalized care plan for preventive services as well as general preventive health recommendations were provided to patient.     Andrez Grime, LPN  [id number]

## 2019-10-14 ENCOUNTER — Other Ambulatory Visit (INDEPENDENT_AMBULATORY_CARE_PROVIDER_SITE_OTHER): Payer: Medicare Other

## 2019-10-14 DIAGNOSIS — E785 Hyperlipidemia, unspecified: Secondary | ICD-10-CM | POA: Diagnosis not present

## 2019-10-14 DIAGNOSIS — E559 Vitamin D deficiency, unspecified: Secondary | ICD-10-CM

## 2019-10-14 DIAGNOSIS — E538 Deficiency of other specified B group vitamins: Secondary | ICD-10-CM

## 2019-10-14 LAB — LIPID PANEL
Cholesterol: 176 mg/dL (ref 0–200)
HDL: 55.4 mg/dL (ref >39.00)
LDL Cholesterol: 102 mg/dL — ABNORMAL HIGH (ref 0–99)
NonHDL: 120.42
Total CHOL/HDL Ratio: 3
Triglycerides: 93 mg/dL (ref 0.0–149.0)
VLDL: 18.6 mg/dL (ref 0.0–40.0)

## 2019-10-14 LAB — VITAMIN B12: Vitamin B-12: 561 pg/mL (ref 211–911)

## 2019-10-14 LAB — VITAMIN D 25 HYDROXY (VIT D DEFICIENCY, FRACTURES): VITD: 46.02 ng/mL (ref 30.00–100.00)

## 2019-10-15 ENCOUNTER — Telehealth: Payer: Self-pay | Admitting: Neurology

## 2019-10-15 NOTE — Telephone Encounter (Signed)
Patient called to let Dr. Carles Collet know her hallucinations have gone away, she hasn't had any for the last two days. However, since stopping taking the amantadine, she has experienced increased jerking and shaking. She said she was told to call with an update next week but she can take it anymore.  [facility]

## 2019-10-15 NOTE — Telephone Encounter (Signed)
Yes, and I told her that would happen.  Having control of dyskinesia is NOT worth sacrificing cognition and causing hallucinations.  Instead of her taking carbidopa/levodopa 25/100, 1.5 tablets tid have her take carbidopa/levodopa 25/100, 1 tablet at 7am/11am/3pm/7pm.

## 2019-10-15 NOTE — Telephone Encounter (Signed)
Left message advising patient to take carbidopa/levodopa 25/100, 1 tablet at 7am/11am/3pm/7pm.

## 2019-10-20 ENCOUNTER — Ambulatory Visit (INDEPENDENT_AMBULATORY_CARE_PROVIDER_SITE_OTHER): Payer: Medicare Other | Admitting: Family Medicine

## 2019-10-20 ENCOUNTER — Encounter: Payer: Self-pay | Admitting: Family Medicine

## 2019-10-20 ENCOUNTER — Other Ambulatory Visit: Payer: Self-pay

## 2019-10-20 ENCOUNTER — Ambulatory Visit (INDEPENDENT_AMBULATORY_CARE_PROVIDER_SITE_OTHER)
Admission: RE | Admit: 2019-10-20 | Discharge: 2019-10-20 | Disposition: A | Discharge status: Home / Self Care | Payer: Medicare Other | Source: Ambulatory Visit | Attending: Family Medicine | Admitting: Family Medicine

## 2019-10-20 VITALS — BP 116/64 | HR 78 | Temp 97.8°F | Ht 66.5 in | Wt 159.2 lb

## 2019-10-20 DIAGNOSIS — Z0000 Encounter for general adult medical examination without abnormal findings: Secondary | ICD-10-CM | POA: Diagnosis not present

## 2019-10-20 DIAGNOSIS — D649 Anemia, unspecified: Secondary | ICD-10-CM

## 2019-10-20 DIAGNOSIS — M5136 Other intervertebral disc degeneration, lumbar region: Secondary | ICD-10-CM

## 2019-10-20 DIAGNOSIS — Z7189 Other specified counseling: Secondary | ICD-10-CM | POA: Diagnosis not present

## 2019-10-20 DIAGNOSIS — K5900 Constipation, unspecified: Secondary | ICD-10-CM

## 2019-10-20 DIAGNOSIS — F321 Major depressive disorder, single episode, moderate: Secondary | ICD-10-CM

## 2019-10-20 DIAGNOSIS — N390 Urinary tract infection, site not specified: Secondary | ICD-10-CM

## 2019-10-20 DIAGNOSIS — E538 Deficiency of other specified B group vitamins: Secondary | ICD-10-CM

## 2019-10-20 DIAGNOSIS — W19XXXA Unspecified fall, initial encounter: Secondary | ICD-10-CM | POA: Insufficient documentation

## 2019-10-20 DIAGNOSIS — S6992XA Unspecified injury of left wrist, hand and finger(s), initial encounter: Secondary | ICD-10-CM | POA: Insufficient documentation

## 2019-10-20 DIAGNOSIS — E785 Hyperlipidemia, unspecified: Secondary | ICD-10-CM

## 2019-10-20 DIAGNOSIS — G20 Parkinson's disease: Secondary | ICD-10-CM

## 2019-10-20 DIAGNOSIS — F411 Generalized anxiety disorder: Secondary | ICD-10-CM

## 2019-10-20 DIAGNOSIS — E559 Vitamin D deficiency, unspecified: Secondary | ICD-10-CM

## 2019-10-20 DIAGNOSIS — M810 Age-related osteoporosis without current pathological fracture: Secondary | ICD-10-CM

## 2019-10-20 DIAGNOSIS — M5416 Radiculopathy, lumbar region: Secondary | ICD-10-CM

## 2019-10-20 MED ORDER — TRAMADOL HCL 50 MG PO TABS: 50.0000 mg | ORAL_TABLET | Freq: Three times a day (TID) | ORAL | 0 refills | Status: DC | PRN

## 2019-10-20 MED ORDER — CLONAZEPAM 0.5 MG PO TABS: 0.5000 mg | ORAL_TABLET | Freq: Two times a day (BID) | ORAL | 1 refills | Status: DC | PRN

## 2019-10-20 NOTE — Progress Notes (Signed)
This visit was conducted in person.  BP 116/64 (BP Location: Left Arm, Patient Position: Sitting, Cuff Size: Normal)    Pulse 78    Temp 97.8 F (36.6 C) (Temporal)    Ht 5' 6.5" (1.689 m)    Wt 159 lb 3 oz (72.2 kg)    SpO2 97%    BMI 25.31 kg/m    CC: CPE Subjective:    Patient ID: [name], female    DOB: 02/09/1946, 72 y.o.   MRN: [id number]  HPI: [name] is a 73 y.o. female presenting on 10/20/2019 for Annual Exam (Prt 2. )   Son drove her here today.   Saw health advisor last week for medicare wellness visit. Note reviewed.   No exam data present    Clinical Support from 10/13/2019 in [place] at [facility] Total Score  0      Fall Risk  10/13/2019 07/15/2019 10/09/2018 10/04/2018 09/10/2018  Falls in the past year? 1 0 0 0 No  Comment blood pressure dropped - - - -  Number falls in past yr: 1 0 0 - -  Injury with Fall? 0 0 0 - -  Risk for fall due to : Medication side effect;Impaired mobility - - - -  Follow up Falls evaluation completed;Falls prevention discussed - Falls evaluation completed - -  She had another fall yesterday into her tub, hit L middle finger and hip. Denies premonitory symptoms. No LOC. She is using cane regularly.   "I feel awful" worsening lower back pain despite shots by Dr Nelva Bush, to see Dr Vertell Limber later today for second opinion. Treating pain with tylenol 500mg  tid and tramadol 50mg  bid and gabapentin 300mg  nightly.  Recent visit earlier this month with neurology for encephalopathy with hallucinations - we treated for possible UTI (>100k Ecoli) with keflex 500mg  bid 1 wk course. Also possibly related to amantadine - this was stopped with improvement. Dyskinesias worsened off this. She didn't have urinary symptoms at the time, just increased confusion/hallucinations. Currently denies urinary symptoms as well.   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 Known glaucoma as well.    Preventative: COLONOSCOPY Date: 06/2008 h/o polyps but latest WNL, rec rpt 10 yrs Olevia Perches).Upcoming appt with GI.  COLONOSCOPY 11/2018 multiple TAs (10 polyps total), rpt 2 yrs [facility] Plan) Mammogram 10/2018 WNL Well woman - s/p hysterectomyandoophorectomy.  DEXA Date: 04/2013 T -2.9atfemur, -1.6 atspine.Last reclast8/2015. 04/2017 DEXA T score -2.9 hip, osteoporosis, continue Q62mo prolia. Will update DEXA 2020 Fluyearly - she states she had flu shot this year, but we don't have records of this.  Prevnar 04/2014, pneumovax6/2016 Tdap 2012  Zostavax 2011  shingrix - discussed  Advanced directives: [name] and Robby sons would be HCPOA. Has packet at home - will work on this.new packet provided. Would be ok with CPR and intubation as well as feeding tube, but doesn't want prolonged life support if terminal condition.  Seat belt use discussed. Sunscreen use discussed. No changing moles on skin. Non smoker Alcohol - none Dentist doesn't see regularly - has dentures Eye exam yearly (cataracts 3 mo ago) Bowel - no constipation - managed with miralax Bladder - + incontinence followed by Dr Karsten Ro (alfuzosin)   Lives with daughter, 1 dog. Widow of husband [name]) 2015. 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     Relevant past medical, surgical, family and social history reviewed and updated as indicated. Interim medical history since our last visit reviewed. Allergies and medications reviewed and updated. Outpatient Medications Prior to Visit  Medication Sig Dispense Refill   alfuzosin (UROXATRAL) 10 MG 24 hr tablet Take 1 tablet by mouth daily.     Artificial Tear Ointment (DRY EYES OP) Apply to eye every 2 (two) hours. Right eye only. OTC     Ascorbic Acid (VITAMIN C) 100 MG tablet Take  100 mg by mouth daily.     Calcium Carbonate-Vitamin D (CALCIUM 600/VITAMIN D) 600-400 MG-UNIT chew tablet Chew 1 tablet by mouth daily.     carbidopa-levodopa (SINEMET CR) 50-200 MG tablet TAKE 1 TABLET BY MOUTH EVERYDAY AT BEDTIME 90 tablet 1   carbidopa-levodopa (SINEMET IR) 25-100 MG tablet TAKE 1.5 TABLETS BY MOUTH 3 TIMES A DAY 405 tablet 1   Cholecalciferol (VITAMIN D3) 25 MCG (1000 UT) CAPS Take 1 capsule (1,000 Units total) by mouth daily. 30 capsule    cyanocobalamin (,VITAMIN B-12,) 1000 MCG/ML injection INJECT 1 ML (1,000 MCG TOTAL) INTO THE MUSCLE EVERY 30 (THIRTY) DAYS. 3 mL 2   Droxidopa (NORTHERA) 300 MG CAPS Take by mouth. 2 po tid     gabapentin (NEURONTIN) 100 MG capsule TAKE 1 CAPSULE BY MOUTH AT BEDTIME 90 capsule 3   latanoprost (XALATAN) 0.005 % ophthalmic solution      NORTHERA 100 MG CAPS TAKE ACCORDING TO 24 HOUR TITRATION SCHEDULE ON THE ENCLOSED DOSING GUIDE 495 capsule 0   polyethylene glycol (MIRALAX / GLYCOLAX) packet Take 17 g by mouth as needed. 30 each 1   pramipexole (MIRAPEX) 1 MG tablet TAKE 1 TABLET BY MOUTH THREE TIMES A DAY 270 tablet 1   sertraline (ZOLOFT) 100 MG tablet TAKE 1 TABLET (100 MG TOTAL) BY MOUTH DAILY. 90 tablet 1   Syringe/Needle, Disp, 18G X 1" 1 ML MISC Use to draw B12 shot monthly 25 each 0   Syringe/Needle, Disp, 25G X 1" 1 ML MISC Use to administer B12 shot monthly 25 each 0   triamcinolone cream (KENALOG) 0.1 % APPLY TO AFFECTED AREA TWICE A DAY 45 g 1   clonazePAM (KLONOPIN) 0.5 MG tablet TAKE 1 TABLET (0.5 MG TOTAL) BY MOUTH 2 (TWO) TIMES DAILY AS NEEDED. 60 tablet 1   traMADol (ULTRAM) 50 MG tablet TAKE 1 TABLET BY MOUTH THREE TIMES A DAY AS NEEDED 50 tablet 0   amantadine (SYMMETREL) 100 MG capsule Take 1 capsule (100 mg total) by mouth 2 (two) times daily. 180 capsule 1   cephALEXin (KEFLEX) 500 MG capsule Take 1 capsule (500 mg total) by mouth 2 (two) times daily. 14 capsule 0   No facility-administered  medications prior to visit.      Per HPI unless specifically indicated in ROS section below Review of Systems  Constitutional: Negative for activity change, appetite change, chills, fatigue, fever and unexpected weight change.  HENT: Negative for hearing loss.   Eyes: Positive for visual disturbance (R eye herpetic infection).  Respiratory: Positive for shortness of breath. Negative for cough, chest tightness and wheezing.   Cardiovascular: Negative for chest pain, palpitations and leg swelling.  Gastrointestinal: Negative for abdominal distention, abdominal pain, blood in stool, constipation, diarrhea, nausea and vomiting.  Genitourinary: Negative for difficulty urinating and hematuria.  Musculoskeletal: Negative for arthralgias, myalgias and neck pain.  Skin: Negative for rash.  Neurological: Positive for dizziness and headaches (  attributed to headache). Negative for seizures and syncope.  Hematological: Negative for adenopathy. Does not bruise/bleed easily.  Psychiatric/Behavioral: Positive for dysphoric mood (depressed mood). The patient is not nervous/anxious.    Objective:    BP 116/64 (BP Location: Left Arm, Patient Position: Sitting, Cuff Size: Normal)    Pulse 78    Temp 97.8 F (36.6 C) (Temporal)    Ht 5' 6.5" (1.689 m)    Wt 159 lb 3 oz (72.2 kg)    SpO2 97%    BMI 25.31 kg/m   Wt Readings from Last 3 Encounters:  10/20/19 159 lb 3 oz (72.2 kg)  10/07/19 154 lb (69.9 kg)  07/15/19 154 lb 9.6 oz (70.1 kg)    Physical Exam Vitals signs and nursing note reviewed.  Constitutional:      General: She is not in acute distress.    Appearance: Normal appearance. She is well-developed. She is not ill-appearing.  HENT:     Head: Normocephalic and atraumatic.     Right Ear: Hearing, tympanic membrane, ear canal and external ear normal.     Left Ear: Hearing, tympanic membrane, ear canal and external ear normal.     Nose: Nose normal.     Mouth/Throat:     Mouth: Mucous  membranes are moist.     Pharynx: Oropharynx is clear. Uvula midline. No posterior oropharyngeal erythema.  Eyes:     General: No scleral icterus.    Extraocular Movements: Extraocular movements intact.     Conjunctiva/sclera: Conjunctivae normal.     Pupils: Pupils are equal, round, and reactive to light.  Neck:     Musculoskeletal: Normal range of motion and neck supple.     Vascular: No carotid bruit.  Cardiovascular:     Rate and Rhythm: Normal rate and regular rhythm.     Pulses: Normal pulses.          Radial pulses are 2+ on the right side and 2+ on the left side.     Heart sounds: Normal heart sounds. No murmur.  Pulmonary:     Effort: Pulmonary effort is normal. No respiratory distress.     Breath sounds: Normal breath sounds. No wheezing, rhonchi or rales.  Abdominal:     General: Abdomen is flat. Bowel sounds are normal. There is no distension.     Palpations: Abdomen is soft. There is no mass.     Tenderness: There is no abdominal tenderness. There is no guarding or rebound.     Hernia: No hernia is present.  Musculoskeletal: Normal range of motion.        General: Swelling and tenderness present.     Right lower leg: No edema.     Left lower leg: No edema.     Comments: L hand neurovascularly intact. Tender swollen L 3rd PIP with mild bruising. ROM flexion limited by swelling. Able to independently flex DIP.   Lymphadenopathy:     Cervical: No cervical adenopathy.  Skin:    General: Skin is warm and dry.     Findings: No rash.  Neurological:     General: No focal deficit present.     Mental Status: She is alert and oriented to person, place, and time.     Comments: CN grossly intact, station and gait intact  Psychiatric:        Mood and Affect: Mood normal.        Behavior: Behavior normal.        Thought Content: Thought content  normal.        Judgment: Judgment normal.       Results for orders placed or performed in visit on 10/14/19  Vitamin B12  Result  Value Ref Range   Vitamin B-12 561 211 - 911 pg/mL  vit d  Result Value Ref Range   VITD 46.02 30.00 - 100.00 ng/mL  Lipid panel  Result Value Ref Range   Cholesterol 176 0 - 200 mg/dL   Triglycerides 93.0 0.0 - 149.0 mg/dL   HDL 55.40 >39.00 mg/dL   VLDL 18.6 0.0 - 40.0 mg/dL   LDL Cholesterol 102 (H) 0 - 99 mg/dL   Total CHOL/HDL Ratio 3    NonHDL 120.42    Depression screen [facility] 2/9 10/13/2019 10/04/2018 09/18/2017 11/14/2016 08/03/2016  Decreased Interest 0 0 1 0 0  Down, Depressed, Hopeless 0 0 1 0 0  PHQ - 2 Score 0 0 2 0 0  Altered sleeping 0 2 2 - -  Tired, decreased energy 0 0 1 - -  Change in appetite 0 1 2 - -  Feeling bad or failure about yourself  0 0 0 - -  Trouble concentrating 0 0 0 - -  Moving slowly or fidgety/restless 0 0 0 - -  Suicidal thoughts 0 0 0 - -  PHQ-9 Score 0 3 7 - -  Difficult doing work/chores Not difficult at all Not difficult at all Not difficult at all - -  Some recent data might be hidden     No flowsheet data found. Assessment & Plan:  This visit occurred during the SARS-CoV-2 public health emergency.  Safety protocols were in place, including screening questions prior to the visit, additional usage of staff PPE, and extensive cleaning of exam room while observing appropriate contact time as indicated for disinfecting solutions.   Problem List Items Addressed This Visit    Vitamin D deficiency    Continue vit D 1000 IU daily.       Vitamin B12 deficiency    Continue b12 monthly shots.       Right lumbar radiculopathy    Mild benefit after 3 LESI. Upcoming NSG eval second opinion.       Relevant Medications   clonazePAM (KLONOPIN) 0.5 MG tablet   Recurrent UTI    Recent UTI possibly contributed to recent AMS/hallucinations with UCx growing E coli, treated with 1 wk keflex course. Pt tolerated well.       Parkinson's disease [facility])    [place] neurology care. Trouble tolerating amantadine for med related dyskinesia.        Relevant Medications   clonazePAM (KLONOPIN) 0.5 MG tablet   Osteoporosis    Update DEXA. Continue prolia and cal/vit D.       Relevant Orders   DG Bone Density   MDD (major depressive disorder), single episode, moderate (HCC)    Deteriorated, but desires to continue current regimen.       HLD (hyperlipidemia)    Chronic, stable off meds. The 10-year ASCVD risk score Mikey Bussing DC Brooke Bonito., et al., 2013) is: 8.5%   Values used to calculate the score:     Age: 42 years     Sex: Female     Is Non-Hispanic African American: No     Diabetic: No     Tobacco smoker: No     Systolic Blood Pressure: 99991111 mmHg     Is BP treated: No     HDL Cholesterol: 55.4 mg/dL  Total Cholesterol: 176 mg/dL       Health maintenance examination - Primary    Preventative protocols reviewed and updated unless pt declined. Discussed healthy diet and lifestyle.  She states she received flu shot earlier this year at our office, but no records of this - will call us back with date to update chart.       GAD (generalized anxiety disorder)    Deteriorated, continue sertraline and klonopin, poor reactions to previously tried antidepressants. Continue to monitor.       Finger injury, left, initial encounter    Anticipate sprained finger - check xray r/o fracture. ?proximal volar 2nd phalanx fracture - await radiology eval.       Relevant Orders   DG Finger Middle Left   Fall with injury    Had fall over weekend with injury - reviewed regular cane use.       DDD (degenerative disc disease), lumbar    Worsening - has 2nd opinion appt planned with neurosurgery.      Relevant Medications   traMADol (ULTRAM) 50 MG tablet   Constipation    Stable on daily miralax       Anemia    Persists. Update iron panel next labwork.       Advanced care planning/counseling discussion    Advanced directives: [name] and Robby sons would be HCPOA. Has packet at home - will work on this.new packet provided. Would be ok  with CPR and intubation as well as feeding tube, but doesn't want prolonged life support if terminal condition.           Meds ordered this encounter  Medications   clonazePAM (KLONOPIN) 0.5 MG tablet    Sig: Take 1 tablet (0.5 mg total) by mouth 2 (two) times daily as needed.    Dispense:  60 tablet    Refill:  1    Not to exceed 4 additional fills before 09/21/2019   traMADol (ULTRAM) 50 MG tablet    Sig: Take 1 tablet (50 mg total) by mouth 3 (three) times daily as needed.    Dispense:  50 tablet    Refill:  0    Not to exceed 5 additional fills before 12/29/2019   Orders Placed This Encounter  Procedures   DG Finger Middle Left    Standing Status:   Future    Number of Occurrences:   1    Standing Expiration Date:   12/19/2020    Order Specific Question:   Reason for Exam (SYMPTOM  OR DIAGNOSIS REQUIRED)    Answer:   L PIP injury after fall 1d ago    Order Specific Question:   Preferred imaging location?    Answer:   [facility]    Order Specific Question:   Radiology Contrast Protocol - do NOT remove file path    Answer:   [email or web address]   DG Bone Density    Standing Status:   Future    Standing Expiration Date:   12/19/2020    Order Specific Question:   Reason for Exam (SYMPTOM  OR DIAGNOSIS REQUIRED)    Answer:   osteoporosis f/u    Order Specific Question:   Preferred imaging location?    Answer:   [facility]    Patient instructions: Call us back with date of flu shot to update your chart.  If interested, check with pharmacy about new 2 shot shingles series (shingrix).  We will try to schedule bone density scan on same  day as mammogram.  Advanced directive packet provided today.  Return in 4 months for follow up visit.   Follow up plan: Return in about 4 months (around 02/17/2020) for follow up visit.  [name], MD

## 2019-10-20 NOTE — Assessment & Plan Note (Addendum)
Preventative protocols reviewed and updated unless pt declined. Discussed healthy diet and lifestyle.  She states she received flu shot earlier this year at our office, but no records of this - will call us back with date to update chart.

## 2019-10-20 NOTE — Telephone Encounter (Signed)
Updated pt's immunizations.  Notified her via [facility].

## 2019-10-20 NOTE — Assessment & Plan Note (Addendum)
Advanced directives: [name] and Robby sons would be HCPOA. Has packet at home - will work on this.new packet provided. Would be ok with CPR and intubation as well as feeding tube, but doesn't want prolonged life support if terminal condition.

## 2019-10-20 NOTE — Assessment & Plan Note (Signed)
Continue b12 monthly shots.

## 2019-10-20 NOTE — Assessment & Plan Note (Signed)
Worsening - has 2nd opinion appt planned with neurosurgery.

## 2019-10-20 NOTE — Assessment & Plan Note (Signed)
Recent UTI possibly contributed to recent AMS/hallucinations with UCx growing E coli, treated with 1 wk keflex course. Pt tolerated well.

## 2019-10-20 NOTE — Assessment & Plan Note (Signed)
Deteriorated, but desires to continue current regimen.

## 2019-10-20 NOTE — Assessment & Plan Note (Signed)
Had fall over weekend with injury - reviewed regular cane use.

## 2019-10-20 NOTE — Assessment & Plan Note (Signed)
Update DEXA. Continue prolia and cal/vit D.

## 2019-10-20 NOTE — Assessment & Plan Note (Addendum)
Anticipate sprained finger - check xray r/o fracture. ?proximal volar 2nd phalanx fracture - await radiology eval.

## 2019-10-20 NOTE — Assessment & Plan Note (Signed)
Chronic, stable off meds. The 10-year ASCVD risk score Mikey Bussing DC Brooke Bonito., et al., 2013) is: 8.5%   Values used to calculate the score:     Age: 73 years     Sex: Female     Is Non-Hispanic African American: No     Diabetic: No     Tobacco smoker: No     Systolic Blood Pressure: 99991111 mmHg     Is BP treated: No     HDL Cholesterol: 55.4 mg/dL     Total Cholesterol: 176 mg/dL

## 2019-10-20 NOTE — Assessment & Plan Note (Signed)
Appreciate neurology care. Trouble tolerating amantadine for med related dyskinesia.

## 2019-10-20 NOTE — Assessment & Plan Note (Signed)
Persists. Update iron panel next labwork.

## 2019-10-20 NOTE — Assessment & Plan Note (Signed)
Continue vit D 1000 IU daily. 

## 2019-10-20 NOTE — Assessment & Plan Note (Signed)
Stable on daily miralax.  

## 2019-10-20 NOTE — Assessment & Plan Note (Signed)
Deteriorated, continue sertraline and klonopin, poor reactions to previously tried antidepressants. Continue to monitor.

## 2019-10-20 NOTE — Assessment & Plan Note (Signed)
Mild benefit after 3 LESI. Upcoming NSG eval second opinion.

## 2019-10-20 NOTE — Patient Instructions (Addendum)
Call us back with date of flu shot to update your chart.  If interested, check with pharmacy about new 2 shot shingles series (shingrix).  We will try to schedule bone density scan on same day as mammogram.  Advanced directive packet provided today.  Return in 4 months for follow up visit.   Health Maintenance After Age 71 After age 11, you are at a higher risk for certain long-term diseases and infections as well as injuries from falls. Falls are a major cause of broken bones and head injuries in people who are older than age 74. Getting regular preventive care can help to keep you healthy and well. Preventive care includes getting regular testing and making lifestyle changes as recommended by your health care provider. Talk with your health care provider about:  Which screenings and tests you should have. A screening is a test that checks for a disease when you have no symptoms.  A diet and exercise plan that is right for you. What should I know about screenings and tests to prevent falls? Screening and testing are the best ways to find a health problem early. Early diagnosis and treatment give you the best chance of managing medical conditions that are common after age 30. Certain conditions and lifestyle choices may make you more likely to have a fall. Your health care provider may recommend:  Regular vision checks. Poor vision and conditions such as cataracts can make you more likely to have a fall. If you wear glasses, make sure to get your prescription updated if your vision changes.  Medicine review. Work with your health care provider to regularly review all of the medicines you are taking, including over-the-counter medicines. Ask your health care provider about any side effects that may make you more likely to have a fall. Tell your health care provider if any medicines that you take make you feel dizzy or sleepy.  Osteoporosis screening. Osteoporosis is a condition that causes the  bones to get weaker. This can make the bones weak and cause them to break more easily.  Blood pressure screening. Blood pressure changes and medicines to control blood pressure can make you feel dizzy.  Strength and balance checks. Your health care provider may recommend certain tests to check your strength and balance while standing, walking, or changing positions.  Foot health exam. Foot pain and numbness, as well as not wearing proper footwear, can make you more likely to have a fall.  Depression screening. You may be more likely to have a fall if you have a fear of falling, feel emotionally low, or feel unable to do activities that you used to do.  Alcohol use screening. Using too much alcohol can affect your balance and may make you more likely to have a fall. What actions can I take to lower my risk of falls? General instructions  Talk with your health care provider about your risks for falling. Tell your health care provider if: ? You fall. Be sure to tell your health care provider about all falls, even ones that seem minor. ? You feel dizzy, sleepy, or off-balance.  Take over-the-counter and prescription medicines only as told by your health care provider. These include any supplements.  Eat a healthy diet and maintain a healthy weight. A healthy diet includes low-fat dairy products, low-fat (lean) meats, and fiber from whole grains, beans, and lots of fruits and vegetables. Home safety  Remove any tripping hazards, such as rugs, cords, and clutter.  Install safety  equipment such as grab bars in bathrooms and safety rails on stairs.  Keep rooms and walkways well-lit. Activity   Follow a regular exercise program to stay fit. This will help you maintain your balance. Ask your health care provider what types of exercise are appropriate for you.  If you need a cane or walker, use it as recommended by your health care provider.  Wear supportive shoes that have nonskid  soles. Lifestyle  Do not drink alcohol if your health care provider tells you not to drink.  If you drink alcohol, limit how much you have: ? 0-1 drink a day for women. ? 0-2 drinks a day for men.  Be aware of how much alcohol is in your drink. In the [place] one drink equals one typical bottle of beer (12 oz), one-half glass of wine (5 oz), or one shot of hard liquor (1 oz).  Do not use any products that contain nicotine or tobacco, such as cigarettes and e-cigarettes. If you need help quitting, ask your health care provider. Summary  Having a healthy lifestyle and getting preventive care can help to protect your health and wellness after age 55.  Screening and testing are the best way to find a health problem early and help you avoid having a fall. Early diagnosis and treatment give you the best chance for managing medical conditions that are more common for people who are older than age 72.  Falls are a major cause of broken bones and head injuries in people who are older than age 39. Take precautions to prevent a fall at home.  Work with your health care provider to learn what changes you can make to improve your health and wellness and to prevent falls. This information is not intended to replace advice given to you by your health care provider. Make sure you discuss any questions you have with your health care provider. Document Released: 09/26/2017 Document Revised: 03/06/2019 Document Reviewed: 09/26/2017 Elsevier Patient Education  2020 [employer].

## 2019-10-31 ENCOUNTER — Other Ambulatory Visit: Payer: Self-pay | Admitting: Family Medicine

## 2019-10-31 DIAGNOSIS — Z1231 Encounter for screening mammogram for malignant neoplasm of breast: Secondary | ICD-10-CM

## 2019-11-04 ENCOUNTER — Ambulatory Visit (INDEPENDENT_AMBULATORY_CARE_PROVIDER_SITE_OTHER)
Admission: RE | Admit: 2019-11-04 | Discharge: 2019-11-04 | Disposition: A | Discharge status: Home / Self Care | Payer: Medicare Other | Source: Ambulatory Visit | Attending: Family Medicine | Admitting: Family Medicine

## 2019-11-04 ENCOUNTER — Other Ambulatory Visit: Payer: Self-pay

## 2019-11-04 DIAGNOSIS — M810 Age-related osteoporosis without current pathological fracture: Secondary | ICD-10-CM | POA: Diagnosis not present

## 2019-11-06 ENCOUNTER — Ambulatory Visit
Admission: RE | Admit: 2019-11-06 | Discharge: 2019-11-06 | Disposition: A | Discharge status: Home / Self Care | Payer: Medicare Other | Source: Ambulatory Visit | Attending: Family Medicine | Admitting: Family Medicine

## 2019-11-06 ENCOUNTER — Other Ambulatory Visit: Payer: Self-pay

## 2019-11-06 DIAGNOSIS — Z1231 Encounter for screening mammogram for malignant neoplasm of breast: Secondary | ICD-10-CM

## 2019-11-06 LAB — HM MAMMOGRAPHY

## 2019-11-07 ENCOUNTER — Encounter: Payer: Self-pay | Admitting: Family Medicine

## 2019-11-11 ENCOUNTER — Other Ambulatory Visit: Payer: Self-pay | Admitting: Neurosurgery

## 2019-11-11 ENCOUNTER — Telehealth: Payer: Self-pay | Admitting: Neurology

## 2019-11-11 ENCOUNTER — Other Ambulatory Visit: Payer: Self-pay

## 2019-11-11 MED ORDER — CARBIDOPA-LEVODOPA 25-100 MG PO TABS: 1.0000 | ORAL_TABLET | Freq: Four times a day (QID) | ORAL | 0 refills | Status: DC

## 2019-11-11 NOTE — Telephone Encounter (Signed)
Sent with new instructions

## 2019-11-11 NOTE — Telephone Encounter (Signed)
Patient left msg about her carbidopa levodopa needing a refill. It was recently increased to 4 xs a day and the pharm is needing a new script for this. She is almost out of medication. Thanks!

## 2019-11-14 ENCOUNTER — Other Ambulatory Visit: Payer: Self-pay | Admitting: Family Medicine

## 2019-11-14 NOTE — Telephone Encounter (Signed)
Tramadol refill request: Last filled on 10/20/2019 #50 with 0 refill  LOV 10/20/2019 CPE Future appointment 02/17/2020

## 2019-11-14 NOTE — Telephone Encounter (Signed)
ERx 

## 2019-11-20 NOTE — Pre-Procedure Instructions (Signed)
[name]  11/20/2019      [name], [place] [address] Phone: [phone] Fax: [place], [place] [name] [id number] Phone: [phone] Fax: [phone]    Your procedure is scheduled on 11/27/19.  Report to [facility] Admitting at [phone] A.M.  Call this number if you have problems the morning of surgery:  [phone]   Remember:  Do not eat or drink after midnight.  Yo   Take these medicines the morning of surgery with A SIP OF WATER ---SINEMENT,EYE DROPS,ZOLOFT,ULTRAM    Do not wear jewelry, make-up or nail polish.  Do not wear lotions, powders, or perfumes, or deodorant.  Do not shave 48 hours prior to surgery.  Men may shave face and neck.  Do not bring valuables to the hospital.  [facility] is not responsible for any belongings or valuables.  Contacts, dentures or bridgework may not be worn into surgery.  Leave your suitcase in the car.  After surgery it may be brought to your room.  For patients admitted to the hospital, discharge time will be determined by your treatment team.  Patients discharged the day of surgery will not be allowed to drive home.    Special instructions:  Do not take any aspirin,anti-inflammatories,vitamins,or herbal supplements 5-7 days prior to surgery.  Please read over the following fact sheets that you were given. MRSA Information   [place] - Preparing for Surgery  Before surgery, you can play an important role.  Because skin is not sterile, your skin needs to be as free of germs as possible.  You can reduce the number of germs on you skin by washing with CHG (chlorahexidine gluconate) soap before surgery.  CHG is an antiseptic cleaner which kills germs and bonds with the skin to continue killing germs even after washing.  Oral Hygiene is also  important in reducing the risk of infection.  Remember to brush your teeth with your regular toothpaste the morning of surgery.  Please DO NOT use if you have an allergy to CHG or antibacterial soaps.  If your skin becomes reddened/irritated stop using the CHG and inform your nurse when you arrive at Short Stay.  Do not shave (including legs and underarms) for at least 48 hours prior to the first CHG shower.  You may shave your face.  Please follow these instructions carefully:   1.  Shower with CHG Soap the night before surgery and the morning of Surgery.  2.  If you choose to wash your hair, wash your hair first as usual with your normal shampoo.  3.  After you shampoo, rinse your hair and body thoroughly to remove the shampoo. 4.  Use CHG as you would any other liquid soap.  You can apply chg directly to the skin and wash gently with a      scrungie or washcloth.           5.  Apply the CHG Soap to your body ONLY FROM THE NECK DOWN.   Do not use on open wounds or open sores. Avoid contact with your eyes, ears, mouth and genitals (private parts).  Wash genitals (private parts) with your normal soap.  6.  Wash thoroughly, paying special attention to the area where your surgery will be performed.  7.  Thoroughly rinse your body with warm water from  the neck down.  8.  DO NOT shower/wash with your normal soap after using and rinsing off the CHG Soap.  9.  Pat yourself dry with a clean towel.            10.  Wear clean pajamas.            11.  Place clean sheets on your bed the night of your first shower and do not sleep with pets.  Day of Surgery  Do not apply any lotions/deoderants the morning of surgery.   Please wear clean clothes to the hospital/surgery center. Remember to brush your teeth with toothpaste.

## 2019-11-23 ENCOUNTER — Other Ambulatory Visit: Payer: Self-pay | Admitting: Neurology

## 2019-11-24 ENCOUNTER — Other Ambulatory Visit: Payer: Self-pay

## 2019-11-24 ENCOUNTER — Encounter (HOSPITAL_COMMUNITY)
Admission: RE | Admit: 2019-11-24 | Discharge: 2019-11-24 | Disposition: A | Discharge status: Home / Self Care | Payer: Medicare Other | Source: Ambulatory Visit | Attending: Neurosurgery | Admitting: Neurosurgery

## 2019-11-24 ENCOUNTER — Other Ambulatory Visit (HOSPITAL_COMMUNITY)
Admission: RE | Admit: 2019-11-24 | Discharge: 2019-11-24 | Disposition: A | Discharge status: Home / Self Care | Payer: Medicare Other | Source: Ambulatory Visit | Attending: Neurosurgery | Admitting: Neurosurgery

## 2019-11-24 ENCOUNTER — Encounter (HOSPITAL_COMMUNITY): Payer: Self-pay

## 2019-11-24 DIAGNOSIS — A4901 Methicillin susceptible Staphylococcus aureus infection, unspecified site: Secondary | ICD-10-CM | POA: Diagnosis not present

## 2019-11-24 DIAGNOSIS — Z01810 Encounter for preprocedural cardiovascular examination: Secondary | ICD-10-CM | POA: Insufficient documentation

## 2019-11-24 DIAGNOSIS — I491 Atrial premature depolarization: Secondary | ICD-10-CM | POA: Diagnosis not present

## 2019-11-24 DIAGNOSIS — Z01812 Encounter for preprocedural laboratory examination: Secondary | ICD-10-CM | POA: Diagnosis not present

## 2019-11-24 HISTORY — DX: Other complications of anesthesia, initial encounter: T88.59XA

## 2019-11-24 HISTORY — DX: Nausea with vomiting, unspecified: R11.2

## 2019-11-24 HISTORY — DX: Other specified postprocedural states: Z98.890

## 2019-11-24 LAB — BASIC METABOLIC PANEL
Anion gap: 11 (ref 5–15)
BUN: 13 mg/dL (ref 8–23)
CO2: 26 mmol/L (ref 22–32)
Calcium: 9.5 mg/dL (ref 8.9–10.3)
Chloride: 104 mmol/L (ref 98–111)
Creatinine, Ser: 0.77 mg/dL (ref 0.44–1.00)
GFR calc Af Amer: >60 mL/min (ref >60)
GFR calc non Af Amer: >60 mL/min (ref >60)
Glucose, Bld: 98 mg/dL (ref 70–99)
Potassium: 3.8 mmol/L (ref 3.5–5.1)
Sodium: 141 mmol/L (ref 135–145)

## 2019-11-24 LAB — CBC
HCT: 35.9 % — ABNORMAL LOW (ref 36.0–46.0)
Hemoglobin: 11.3 g/dL — ABNORMAL LOW (ref 12.0–15.0)
MCH: 28.8 pg (ref 26.0–34.0)
MCHC: 31.5 g/dL (ref 30.0–36.0)
MCV: 91.6 fL (ref 80.0–100.0)
Platelets: 248 10*3/uL (ref 150–400)
RBC: 3.92 MIL/uL (ref 3.87–5.11)
RDW: 15.6 % — ABNORMAL HIGH (ref 11.5–15.5)
WBC: 4.4 10*3/uL (ref 4.0–10.5)
nRBC: 0 % (ref 0.0–0.2)

## 2019-11-24 LAB — SURGICAL PCR SCREEN
MRSA, PCR: NEGATIVE
Staphylococcus aureus: POSITIVE — AB

## 2019-11-24 NOTE — Progress Notes (Addendum)
PCP - [name], MD Cardiologist - pt denies   Chest x-ray - pt denies EKG - 11/24/2019 at PAT appt  PPM/ICD- n/a DEVICE- REP Notified-  Stress Test - pt denies ECHO - 2011 in EPIC  Cardiac Cath - pt denies  Sleep Study - pt denis  CPAP - n/a  Fasting Blood Sugar - n/a Checks Blood Sugar _____ times a day- n/a  Blood Thinner Instructions: n/a Aspirin Instructions:n/a  ERAS Protocol- n/a Ensure pre-surgery drink or water given- n/a  COVID testing- 11/24/2019 1240- pt aware  Anesthesia review: Yes, heart hx, EKG  Patient denies shortness of breath, fever, cough and chest pain at PAT appointment  Patient verbalized understanding of instructions that were given to them at the PAT appointment. Patient was also instructed that they will need to review over the PAT instructions again at home before surgery.

## 2019-11-24 NOTE — Pre-Procedure Instructions (Signed)
[name]  11/24/2019      [name],  [name] [address] Phone: [phone] Fax: [name], [name] [name] [id number] Phone: [phone] Fax: [phone]    Your procedure is scheduled on 11/27/19.  Report to [facility] Admitting at [phone] AM.  Call this number if you have problems the morning of surgery:  [phone]   Remember:  Do not eat or drink after midnight.     Take these medicines the morning of surgery with A SIP OF WATER ---SINEMENT,EYE DROPS,ZOLOFT,ULTRAM  Beginning now, STOP taking any Aspirin (unless otherwise instructed by your surgeon), Aleve, Naproxen, Ibuprofen, Motrin, Advil, Goody's, BC's, all herbal medications, fish oil, and all vitamins   Day of surgery:   Do not wear jewelry, make-up or nail polish.  Do not wear lotions, powders, or perfumes, or deodorant.  Do not shave 48 hours prior to surgery.    Do not bring valuables to the [name].  [name] is not responsible for any belongings or valuables.  Contacts, dentures or bridgework may not be worn into surgery.  Leave your suitcase in the car.  After surgery it may be brought to your room.  For patients admitted to the [name], discharge time will be determined by your treatment team.  Patients discharged the day of surgery will not be allowed to drive home.    [name]- Preparing For Surgery  Before surgery, you can play an important role. Because skin is not sterile, your skin needs to be as free of germs as possible. You can reduce the number of germs on your skin by washing with CHG (chlorahexidine gluconate) Soap before surgery.  CHG is an antiseptic cleaner which kills germs and bonds with the skin to continue killing germs even after washing.    Oral Hygiene is also important to reduce your risk  of infection.  Remember - BRUSH YOUR TEETH THE MORNING OF SURGERY WITH YOUR REGULAR TOOTHPASTE  Please do not use if you have an allergy to CHG or antibacterial soaps. If your skin becomes reddened/irritated stop using the CHG.  Do not shave (including legs and underarms) for at least 48 hours prior to first CHG shower. It is OK to shave your face.  Please follow these instructions carefully.   1. Shower the NIGHT BEFORE SURGERY and the MORNING OF SURGERY with CHG.   2. If you chose to wash your hair, wash your hair first as usual with your normal shampoo.  3. After you shampoo, rinse your hair and body thoroughly to remove the shampoo.  4. Use CHG as you would any other liquid soap. You can apply CHG directly to the skin and wash gently with a scrungie or a clean washcloth.   5. Apply the CHG Soap to your body ONLY FROM THE NECK DOWN.  Do not use on open wounds or open sores. Avoid contact with your eyes, ears, mouth and genitals (private parts). Wash Face and genitals (private parts)  with your normal soap.  6. Wash thoroughly, paying special attention to the area where your surgery will be performed.  7. Thoroughly rinse your body with warm water from the neck down.  8. DO NOT shower/wash with your normal soap after using and rinsing off the CHG Soap.  9. Pat yourself dry with a CLEAN TOWEL.  10. Wear CLEAN  PAJAMAS to bed the night before surgery, wear comfortable clothes the morning of surgery  11. Place CLEAN SHEETS on your bed the night of your first shower and DO NOT SLEEP WITH PETS.  Day of Surgery: Shower as above Do not apply any deodorants/lotions.  Please wear clean clothes to the [name]/surgery center.   Remember to brush your teeth WITH YOUR REGULAR TOOTHPASTE.

## 2019-11-25 LAB — NOVEL CORONAVIRUS, NAA (HOSP ORDER, SEND-OUT TO REF LAB; TAT 18-24 HRS): SARS-CoV-2, NAA: NOT DETECTED

## 2019-11-25 NOTE — Anesthesia Preprocedure Evaluation (Addendum)
Anesthesia Evaluation  Patient identified by MRN, date of birth, ID band Patient awake    Reviewed: Allergy & Precautions, NPO status , Patient's Chart, lab work & pertinent test results  History of Anesthesia Complications (+) PONV  Airway Mallampati: II  TM Distance: >3 FB Neck ROM: Full    Dental no notable dental hx.    Pulmonary neg pulmonary ROS,    Pulmonary exam normal breath sounds clear to auscultation       Cardiovascular negative cardio ROS Normal cardiovascular exam Rhythm:Regular Rate:Normal     Neuro/Psych Anxiety Depression parkinsons dz  Neuromuscular disease    GI/Hepatic Neg liver ROS, GERD  ,  Endo/Other  negative endocrine ROS  Renal/GU negative Renal ROS  negative genitourinary   Musculoskeletal negative musculoskeletal ROS (+)   Abdominal   Peds negative pediatric ROS (+)  Hematology negative hematology ROS (+)   Anesthesia Other Findings   Reproductive/Obstetrics negative OB ROS                             Anesthesia Physical Anesthesia Plan  ASA: III  Anesthesia Plan: General   Post-op Pain Management:    Induction: Intravenous  PONV Risk Score and Plan: 4 or greater and Ondansetron, Dexamethasone and Treatment may vary due to age or medical condition  Airway Management Planned: Oral ETT  Additional Equipment:   Intra-op Plan:   Post-operative Plan: Extubation in OR  Informed Consent: I have reviewed the patients History and Physical, chart, labs and discussed the procedure including the risks, benefits and alternatives for the proposed anesthesia with the patient or authorized representative who has indicated his/her understanding and acceptance.     Dental advisory given  Plan Discussed with: CRNA and Surgeon  Anesthesia Plan Comments: (PAT note written 11/25/2019 by Myra Gianotti, PA-C. 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 LVEF recovered by 02/2007. As needed cardiology follow-up 2011 (Dr. Johnsie Cancel). )       Anesthesia Quick Evaluation

## 2019-11-25 NOTE — Progress Notes (Signed)
Anesthesia Chart Review:  Case: [id number] Date/Time: 11/27/19 1300   Procedure: Right Lumbar 4-5 foraminotomy with possible microdiscectomy (Right ) - Right Lumbar 4-5 foraminotomy with possible microdiscectomy   Anesthesia type: General   Pre-op diagnosis: Disc displacement, Lumbar   Location: [facility] OR ROOM 14 / [facility]   Surgeons: Erline Levine, MD      DISCUSSION: Patient is a 73 year old female scheduled for the above procedure.  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 Coli UTI with bacteremia and septic shock, ARDS; troponin peaked 5.5; EF 20% 12/30/06, 40-45% 01/04/07; EF 67% by cardiac MRI 02/28/07 that "would strongly indicate that the patient does not have epicardial coronary disease and that she had either catecholaminergic surge in Takotsubo disease or cardiomyopathy of sepsis"), HLD, orthostatic hypotension (with syncope 01/2008, 11/2008; managed on droxidopa), GERD, glaucoma, SLE, Parkinson's disease, fibromyalgia, reflex sympathetic dystrophy (RLE, RUE), mixed connective tissue disease, interstitial cystitis, anemia.   Patient's EF recovered by 02/2007 after 12/2006 Takotsubo versus septic cardiomyopathy. Last seen by cardiology in 2011 with non-ischemic stress echo and normal EF. PRN cardiology follow-up advised.  She denied shortness of breath, cough, fever, chest pain at PAT RN visit.  11/24/19 COVID-19 test negative. Based on currently available information, I would anticipate that she could proceed as planned if no acute changes.    VS: BP 116/67   Pulse 100   Temp 36.4 C   Resp 19   Ht 5\' 6"  (1.676 m)   Wt 75.1 kg   SpO2 100%   BMI 26.72 kg/m    PROVIDERS: [name], MD his PCP. CPE visit 10/20/19.  Alonza Bogus, DO is neurologist. Telemedicine visit 10/08/19. She was on amantadine for dyskinesia, but discontinued due to hallucinations.  Alona Bene, MD [facility]rologist [facility]) - She is not followed by pulmonology regularly, but last seen in 2018 by Christinia Gully, MD for cough.  - She is not followed by cardiology on a regular basis, but was seen by Jenkins Rouge, MD in 2008 at the time of cardiomyopathy diagnosis (PRN follow-up 10/29/07 following sustained recovery of LVEF; did not recommend cardiac cath) and last 01/2010 for atypical chest pain. She had a non-ischemic stress echo with normal LVEF. PRN cardiology follow-up recommended. (She did not have a cardiac cath in 2008 at the time of her Takotsubo versus septic cardiomyopathy, as she was too ill at the time.)   LABS: Labs reviewed: Acceptable for surgery. (all labs ordered are listed, but only abnormal results are displayed)  Labs Reviewed  SURGICAL PCR SCREEN - Abnormal; Notable for the following components:      Result Value   Staphylococcus aureus POSITIVE (*)    All other components within normal limits  CBC - Abnormal; Notable for the following components:   Hemoglobin 11.3 (*)    HCT 35.9 (*)    RDW 15.6 (*)    All other components within normal limits  BASIC METABOLIC PANEL     IMAGES: MRI L-spine 11/06/18: IMPRESSION: 1. Generalized disc and facet degeneration as described. 2. On the symptomatic right side there is L5-S1 foraminal impingement due to disc height loss and protrusion. 3. L4-5 right more than left subarticular recess stenosis without static L5 impingement.   EKG: 11/24/19: Sinus rhythm with occasional Premature atrial complexes Otherwise normal ECG Confirmed by Loralie Champagne [phone]) on 11/25/2019 1:22:48 AM   CV: Stress Echo 02/17/10: Study Conclusions  - Stress ECG  conclusions: There were no stress arrhythmias or   conduction abnormalities. The stress ECG was normal.  - Staged echo: There was no echocardiographic evidence for   stress-induced ischemia.  Impressions:   - Dobutamine echocardiogram with chest pain but no ST changes; no    stress-induced wallmotion abnormalities.    Echo 12/13/09: Study Conclusions  - Left ventricle: The cavity size was normal. Wall thickness was   normal. Systolic function was normal. The estimated ejection   fraction was in the range of 55% to 60%. Although no diagnostic   regional wall motion abnormality was identified, this possibility   cannot be completely excluded on the basis of this study.  - Aortic valve: There was no stenosis. Trivial regurgitation.  - Mitral valve: No significant regurgitation.  - Right ventricle: The cavity size was normal. Systolic function was   normal.  - Pulmonary arteries: No TR doppler jet was obtained so unable to   estimate PA systolic pressure.  - Inferior vena cava: The vessel was normal in size; the   respirophasic diameter changes were in the normal range (= 50%);   findings are consistent with normal central venous pressure.    MRI Cardiac 02/28/07: IMPRESSION:  1. Remarkable recovery and LV function with totally normal LV cavity size. EF of 67%. This would strongly indicate that the patient does not have epicardial coronary disease and that she had either catecholaminergic surge in Takotsubo disease or cardiomyopathy of sepsis.  2. No hyperenhancement or scar tissue.    Past Medical History:  Diagnosis Date  . Allergy   . ANEMIA-NOS 09/25/2007  . Anxiety   . Arthritis   . B12 DEFICIENCY 05/03/2007  . Blood transfusion without reported diagnosis   . CAD (coronary artery disease) 01/2010   MI, Nishan  . Cardiomyopathy ([place]) 02/08/2010   H/o this 2012 after urosepsis, no recurrence.   . Cataract   . CHF (congestive heart failure) ([place])   . Complication of anesthesia   . Depression   . FIBROMYALGIA 05/03/2007  . GERD 02/22/2010  . Glaucoma 02/2013   [facility]  . History of CHF (congestive heart failure) 01/2010  . History of colon polyps 2004  . HYPERLIPIDEMIA 12/19/2007  . HYPOTENSION, ORTHOSTATIC 12/06/2008   . Interstitial cystitis    [place] now Dr Amalia Hailey  . Lupus (systemic lupus erythematosus) ([place]) 02/08/2010  . MCTD (mixed connective tissue disease) ([place]) 02/08/2010  . OSTEOPOROSIS 08/2009   bisphosphonate on hold 2/2 dysphagia, on reclast done in August each year  . Parkinson's disease ([place]) 08/25/2015   Dx Dr Carles Collet 07/2015   . PONV (postoperative nausea and vomiting)   . 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 Takotsubo cardiomyopathy 2008   due to E coli urosepsis    Past Surgical History:  Procedure Laterality Date  . ABDOMINAL HYSTERECTOMY  1970s   IUD infection - first partial then with oophorectomy (cysts), complication - low blood pressure  . CATARACT EXTRACTION Bilateral   . CHOLECYSTECTOMY     complication - low blood pressure  . COLONOSCOPY  06/2008   h/o polyps but latest WNL, rec rpt 10 yrs Olevia Perches)  . COLONOSCOPY  11/2018   multiple TAs (10 polyps total), rpt 2 yrs Fuller Plan)  . CYSTOSCOPY  12/2013   abx treatment for recurrent cystitis  . DEXA  04/2013   T -2.9 @ femur, -1.6 @ spine  . DEXA  04/2017   T -2.9 hip, -0.7 spine  .  ESOPHAGOGASTRODUODENOSCOPY  12/2017   WNL, regardless esophagus dilated, small HH ([place])    MEDICATIONS: . alfuzosin (UROXATRAL) 10 MG 24 hr tablet  . Artificial Tear Ointment (DRY EYES OP)  . Calcium Carbonate-Vitamin D (CALCIUM 600/VITAMIN D) 600-400 MG-UNIT chew tablet  . carbidopa-levodopa (SINEMET CR) 50-200 MG tablet  . carbidopa-levodopa (SINEMET IR) 25-100 MG tablet  . Cholecalciferol (VITAMIN D3) 25 MCG (1000 UT) CAPS  . clonazePAM (KLONOPIN) 0.5 MG tablet  . cyanocobalamin (,VITAMIN B-12,) 1000 MCG/ML injection  . gabapentin (NEURONTIN) 100 MG capsule  . latanoprost (XALATAN) 0.005 % ophthalmic solution  . NORTHERA 100 MG CAPS  . polyethylene glycol (MIRALAX / GLYCOLAX) packet  . pramipexole (MIRAPEX) 1 MG tablet  . sertraline (ZOLOFT) 100 MG tablet  . Syringe/Needle, Disp, 18G X 1" 1 ML MISC  .  Syringe/Needle, Disp, 25G X 1" 1 ML MISC  . traMADol (ULTRAM) 50 MG tablet  . triamcinolone cream (KENALOG) 0.1 %   No current facility-administered medications for this encounter.    Myra Gianotti, PA-C Surgical Short Stay/Anesthesiology [facility] Phone [phone] [facility] Phone [phone] 11/25/2019 4:19 PM

## 2019-11-26 ENCOUNTER — Encounter: Payer: Self-pay | Admitting: *Deleted

## 2019-11-26 NOTE — Progress Notes (Signed)
[name] [place],  [address] Hours of Operations: Address: 5 a.m. - 10 p.m. PT, Monday-Friday [id number] 6 a.m. - 3 p.m. PT, [place] [address] Date: 11/26/2019 To: [name] From: [employer] Phone: [phone] Phone: [phone] Fax: [phone] Reference #: [id number] RE: Prior Authorization Request Patient Name: [name] Patient DOB: 05-Jan-1946 Patient ID: [id number] Status of Request: Approve Medication Name: Loanne Drilling 100mg  GPI/NDC: [id number] Decision Notes: [place] CAP 100MG , use as directed, is approved through 11/26/2020 under your Medicare Part D benefit. Reviewed by: System If the treating physician would like to discuss this coverage decision with the physician or health care professional reviewer, please call [employer] Prior Authorization department at [phone]

## 2019-11-26 NOTE — Progress Notes (Addendum)
[name] (Key: Jeannie Fend 100MG  capsules   Form [employer] Medicare Part D Electronic Prior Authorization Form (2017 NCPDP) Created 3 days ago Sent to Plan 4 minutes ago Plan Response 4 minutes ago Submit Clinical Questions 3 minutes ago Determination Favorable 1 minute ago Message from Plan Request Reference Number: [id number]. [place] CAP 100MG  is approved through 11/26/2020. For further questions, call [phone].

## 2019-11-27 ENCOUNTER — Ambulatory Visit (HOSPITAL_COMMUNITY): Payer: Medicare Other

## 2019-11-27 ENCOUNTER — Other Ambulatory Visit: Payer: Self-pay

## 2019-11-27 ENCOUNTER — Encounter (HOSPITAL_COMMUNITY): Payer: Self-pay | Admitting: Neurosurgery

## 2019-11-27 ENCOUNTER — Ambulatory Visit (HOSPITAL_COMMUNITY): Payer: Medicare Other | Admitting: Certified Registered"

## 2019-11-27 ENCOUNTER — Encounter (HOSPITAL_COMMUNITY)
Admission: AD | Disposition: A | Discharge status: Home / Self Care | Payer: Self-pay | Source: Home / Self Care | Attending: Neurosurgery

## 2019-11-27 ENCOUNTER — Observation Stay (HOSPITAL_COMMUNITY)
Admission: AD | Admit: 2019-11-27 | Discharge: 2019-11-28 | Disposition: A | Discharge status: Home / Self Care | Payer: Medicare Other | Attending: Neurosurgery | Admitting: Neurosurgery

## 2019-11-27 ENCOUNTER — Ambulatory Visit (HOSPITAL_COMMUNITY): Payer: Medicare Other | Admitting: Vascular Surgery

## 2019-11-27 DIAGNOSIS — M5116 Intervertebral disc disorders with radiculopathy, lumbar region: Secondary | ICD-10-CM | POA: Diagnosis not present

## 2019-11-27 DIAGNOSIS — Z881 Allergy status to other antibiotic agents status: Secondary | ICD-10-CM | POA: Diagnosis not present

## 2019-11-27 DIAGNOSIS — F329 Major depressive disorder, single episode, unspecified: Secondary | ICD-10-CM | POA: Diagnosis not present

## 2019-11-27 DIAGNOSIS — Z419 Encounter for procedure for purposes other than remedying health state, unspecified: Secondary | ICD-10-CM

## 2019-11-27 DIAGNOSIS — Z882 Allergy status to sulfonamides status: Secondary | ICD-10-CM | POA: Diagnosis not present

## 2019-11-27 DIAGNOSIS — Z888 Allergy status to other drugs, medicaments and biological substances status: Secondary | ICD-10-CM | POA: Insufficient documentation

## 2019-11-27 DIAGNOSIS — K219 Gastro-esophageal reflux disease without esophagitis: Secondary | ICD-10-CM | POA: Diagnosis not present

## 2019-11-27 DIAGNOSIS — R030 Elevated blood-pressure reading, without diagnosis of hypertension: Secondary | ICD-10-CM | POA: Diagnosis not present

## 2019-11-27 DIAGNOSIS — F419 Anxiety disorder, unspecified: Secondary | ICD-10-CM | POA: Insufficient documentation

## 2019-11-27 DIAGNOSIS — Z79899 Other long term (current) drug therapy: Secondary | ICD-10-CM | POA: Insufficient documentation

## 2019-11-27 DIAGNOSIS — M4126 Other idiopathic scoliosis, lumbar region: Secondary | ICD-10-CM | POA: Diagnosis not present

## 2019-11-27 DIAGNOSIS — M48061 Spinal stenosis, lumbar region without neurogenic claudication: Secondary | ICD-10-CM | POA: Insufficient documentation

## 2019-11-27 DIAGNOSIS — R531 Weakness: Secondary | ICD-10-CM | POA: Insufficient documentation

## 2019-11-27 DIAGNOSIS — Z885 Allergy status to narcotic agent status: Secondary | ICD-10-CM | POA: Diagnosis not present

## 2019-11-27 HISTORY — PX: LUMBAR LAMINECTOMY/DECOMPRESSION MICRODISCECTOMY: SHX5026

## 2019-11-27 HISTORY — DX: Spinal stenosis, lumbar region without neurogenic claudication: M48.061

## 2019-11-27 SURGERY — LUMBAR LAMINECTOMY/DECOMPRESSION MICRODISCECTOMY 1 LEVEL
Anesthesia: General | Laterality: Right

## 2019-11-27 MED ORDER — CALCIUM CARBONATE-VITAMIN D 500-200 MG-UNIT PO TABS
1.0000 | ORAL_TABLET | Freq: Every day | ORAL | Status: DC
  Administered 2019-11-28: 1 via ORAL
  Filled 2019-11-27: qty 1

## 2019-11-27 MED ORDER — SODIUM CHLORIDE 0.9% FLUSH
3.0000 mL | Freq: Two times a day (BID) | INTRAVENOUS | Status: DC
  Administered 2019-11-27: 3 mL via INTRAVENOUS

## 2019-11-27 MED ORDER — ACETAMINOPHEN 650 MG RE SUPP: 650.0000 mg | RECTAL | Status: DC | PRN

## 2019-11-27 MED ORDER — 0.9 % SODIUM CHLORIDE (POUR BTL) OPTIME
TOPICAL | Status: DC | PRN
  Administered 2019-11-27: 1000 mL

## 2019-11-27 MED ORDER — ALFUZOSIN HCL ER 10 MG PO TB24
10.0000 mg | ORAL_TABLET | Freq: Every day | ORAL | Status: DC
  Administered 2019-11-27 – 2019-11-28 (×2): 10 mg via ORAL
  Filled 2019-11-27 (×2): qty 1

## 2019-11-27 MED ORDER — ONDANSETRON HCL 4 MG/2ML IJ SOLN: 4.0000 mg | Freq: Four times a day (QID) | INTRAMUSCULAR | Status: DC | PRN

## 2019-11-27 MED ORDER — VANCOMYCIN HCL IN DEXTROSE 1-5 GM/200ML-% IV SOLN
1000.0000 mg | Freq: Once | INTRAVENOUS | Status: DC
  Filled 2019-11-27: qty 200

## 2019-11-27 MED ORDER — HYDROMORPHONE HCL 1 MG/ML IJ SOLN
INTRAMUSCULAR | Status: AC
  Filled 2019-11-27: qty 1

## 2019-11-27 MED ORDER — ONDANSETRON HCL 4 MG/2ML IJ SOLN
INTRAMUSCULAR | Status: AC
  Filled 2019-11-27: qty 2

## 2019-11-27 MED ORDER — METHOCARBAMOL 500 MG PO TABS
500.0000 mg | ORAL_TABLET | Freq: Four times a day (QID) | ORAL | Status: DC | PRN
  Administered 2019-11-27 – 2019-11-28 (×3): 500 mg via ORAL
  Filled 2019-11-27 (×2): qty 1

## 2019-11-27 MED ORDER — ONDANSETRON HCL 4 MG/2ML IJ SOLN
INTRAMUSCULAR | Status: DC | PRN
  Administered 2019-11-27: 4 mg via INTRAVENOUS

## 2019-11-27 MED ORDER — EPHEDRINE 5 MG/ML INJ
INTRAVENOUS | Status: AC
  Filled 2019-11-27: qty 10

## 2019-11-27 MED ORDER — THROMBIN 5000 UNITS EX SOLR: OROMUCOSAL | Status: DC | PRN

## 2019-11-27 MED ORDER — LATANOPROST 0.005 % OP SOLN
1.0000 [drp] | Freq: Every day | OPHTHALMIC | Status: DC
  Administered 2019-11-28: 1 [drp] via OPHTHALMIC
  Filled 2019-11-27: qty 2.5

## 2019-11-27 MED ORDER — PANTOPRAZOLE SODIUM 40 MG IV SOLR
40.0000 mg | Freq: Every day | INTRAVENOUS | Status: DC
  Administered 2019-11-27: 40 mg via INTRAVENOUS
  Filled 2019-11-27: qty 40

## 2019-11-27 MED ORDER — BUPIVACAINE HCL (PF) 0.5 % IJ SOLN
INTRAMUSCULAR | Status: AC
  Filled 2019-11-27: qty 30

## 2019-11-27 MED ORDER — VITAMIN D 25 MCG (1000 UNIT) PO TABS
1000.0000 [IU] | ORAL_TABLET | Freq: Every day | ORAL | Status: DC
  Administered 2019-11-28: 1000 [IU] via ORAL
  Filled 2019-11-27: qty 1

## 2019-11-27 MED ORDER — HYDROCODONE-ACETAMINOPHEN 5-325 MG PO TABS
1.0000 | ORAL_TABLET | ORAL | Status: DC | PRN
  Administered 2019-11-27 – 2019-11-28 (×4): 2 via ORAL
  Filled 2019-11-27 (×4): qty 2

## 2019-11-27 MED ORDER — CALCIUM CARBONATE-VITAMIN D 600-400 MG-UNIT PO CHEW: 1.0000 | CHEWABLE_TABLET | Freq: Every day | ORAL | Status: DC

## 2019-11-27 MED ORDER — MIDAZOLAM HCL 5 MG/5ML IJ SOLN
INTRAMUSCULAR | Status: DC | PRN
  Administered 2019-11-27: 2 mg via INTRAVENOUS

## 2019-11-27 MED ORDER — SODIUM CHLORIDE 0.9 % IV SOLN: 250.0000 mL | INTRAVENOUS | Status: DC

## 2019-11-27 MED ORDER — CEFAZOLIN SODIUM-DEXTROSE 2-4 GM/100ML-% IV SOLN
2.0000 g | Freq: Three times a day (TID) | INTRAVENOUS | Status: AC
  Administered 2019-11-27 – 2019-11-28 (×2): 2 g via INTRAVENOUS
  Filled 2019-11-27 (×2): qty 100

## 2019-11-27 MED ORDER — CYANOCOBALAMIN 1000 MCG/ML IJ SOLN: 1000.0000 ug | INTRAMUSCULAR | Status: DC

## 2019-11-27 MED ORDER — PROMETHAZINE HCL 25 MG/ML IJ SOLN
INTRAMUSCULAR | Status: AC
  Filled 2019-11-27: qty 1

## 2019-11-27 MED ORDER — PHENYLEPHRINE 40 MCG/ML (10ML) SYRINGE FOR IV PUSH (FOR BLOOD PRESSURE SUPPORT)
PREFILLED_SYRINGE | INTRAVENOUS | Status: DC | PRN
  Administered 2019-11-27: 160 ug via INTRAVENOUS

## 2019-11-27 MED ORDER — LACTATED RINGERS IV SOLN: INTRAVENOUS | Status: DC

## 2019-11-27 MED ORDER — HYDROMORPHONE HCL 1 MG/ML IJ SOLN
0.2500 mg | INTRAMUSCULAR | Status: DC | PRN
  Administered 2019-11-27 (×2): 0.5 mg via INTRAVENOUS

## 2019-11-27 MED ORDER — ROCURONIUM BROMIDE 10 MG/ML (PF) SYRINGE
PREFILLED_SYRINGE | INTRAVENOUS | Status: AC
  Filled 2019-11-27: qty 10

## 2019-11-27 MED ORDER — FLEET ENEMA 7-19 GM/118ML RE ENEM: 1.0000 | ENEMA | Freq: Once | RECTAL | Status: DC | PRN

## 2019-11-27 MED ORDER — MIDAZOLAM HCL 2 MG/2ML IJ SOLN
INTRAMUSCULAR | Status: AC
  Filled 2019-11-27: qty 2

## 2019-11-27 MED ORDER — PROPOFOL 10 MG/ML IV BOLUS
INTRAVENOUS | Status: AC
  Filled 2019-11-27: qty 20

## 2019-11-27 MED ORDER — POLYETHYLENE GLYCOL 3350 17 G PO PACK: 17.0000 g | PACK | Freq: Every day | ORAL | Status: DC | PRN

## 2019-11-27 MED ORDER — THROMBIN 5000 UNITS EX SOLR
CUTANEOUS | Status: AC
  Filled 2019-11-27: qty 5000

## 2019-11-27 MED ORDER — GABAPENTIN 100 MG PO CAPS
100.0000 mg | ORAL_CAPSULE | Freq: Every day | ORAL | Status: DC
  Administered 2019-11-27: 100 mg via ORAL
  Filled 2019-11-27: qty 1

## 2019-11-27 MED ORDER — CEFAZOLIN SODIUM-DEXTROSE 2-4 GM/100ML-% IV SOLN
INTRAVENOUS | Status: AC
  Filled 2019-11-27: qty 100

## 2019-11-27 MED ORDER — EPHEDRINE SULFATE-NACL 50-0.9 MG/10ML-% IV SOSY
PREFILLED_SYRINGE | INTRAVENOUS | Status: DC | PRN
  Administered 2019-11-27: 10 mg via INTRAVENOUS

## 2019-11-27 MED ORDER — LIDOCAINE-EPINEPHRINE 1 %-1:100000 IJ SOLN
INTRAMUSCULAR | Status: AC
  Filled 2019-11-27: qty 1

## 2019-11-27 MED ORDER — ALUM & MAG HYDROXIDE-SIMETH 200-200-20 MG/5ML PO SUSP: 30.0000 mL | Freq: Four times a day (QID) | ORAL | Status: DC | PRN

## 2019-11-27 MED ORDER — PRAMIPEXOLE DIHYDROCHLORIDE 1 MG PO TABS
1.0000 mg | ORAL_TABLET | Freq: Three times a day (TID) | ORAL | Status: DC
  Administered 2019-11-27 – 2019-11-28 (×3): 1 mg via ORAL
  Filled 2019-11-27 (×2): qty 4
  Filled 2019-11-27: qty 1
  Filled 2019-11-27: qty 4
  Filled 2019-11-27: qty 1

## 2019-11-27 MED ORDER — CARBIDOPA-LEVODOPA 25-100 MG PO TABS
1.5000 | ORAL_TABLET | Freq: Three times a day (TID) | ORAL | Status: DC
  Administered 2019-11-27 – 2019-11-28 (×3): 1.5 via ORAL
  Filled 2019-11-27 (×4): qty 1.5

## 2019-11-27 MED ORDER — DOCUSATE SODIUM 100 MG PO CAPS
100.0000 mg | ORAL_CAPSULE | Freq: Two times a day (BID) | ORAL | Status: DC
  Administered 2019-11-27 – 2019-11-28 (×2): 100 mg via ORAL
  Filled 2019-11-27 (×2): qty 1

## 2019-11-27 MED ORDER — PHENYLEPHRINE 40 MCG/ML (10ML) SYRINGE FOR IV PUSH (FOR BLOOD PRESSURE SUPPORT)
PREFILLED_SYRINGE | INTRAVENOUS | Status: AC
  Filled 2019-11-27: qty 10

## 2019-11-27 MED ORDER — FENTANYL CITRATE (PF) 100 MCG/2ML IJ SOLN
INTRAMUSCULAR | Status: DC | PRN
  Administered 2019-11-27 (×3): 50 ug via INTRAVENOUS

## 2019-11-27 MED ORDER — FENTANYL CITRATE (PF) 250 MCG/5ML IJ SOLN
INTRAMUSCULAR | Status: AC
  Filled 2019-11-27: qty 5

## 2019-11-27 MED ORDER — FENTANYL CITRATE (PF) 100 MCG/2ML IJ SOLN
INTRAMUSCULAR | Status: AC
  Filled 2019-11-27: qty 2

## 2019-11-27 MED ORDER — CARBIDOPA-LEVODOPA 25-100 MG PO TABS: 1.0000 | ORAL_TABLET | ORAL | Status: DC

## 2019-11-27 MED ORDER — CHLORHEXIDINE GLUCONATE CLOTH 2 % EX PADS: 6.0000 | MEDICATED_PAD | Freq: Once | CUTANEOUS | Status: DC

## 2019-11-27 MED ORDER — POLYETHYLENE GLYCOL 3350 17 G PO PACK: 17.0000 g | PACK | Freq: Every day | ORAL | Status: DC

## 2019-11-27 MED ORDER — CARBIDOPA-LEVODOPA 25-100 MG PO TABS
1.0000 | ORAL_TABLET | ORAL | Status: DC
  Filled 2019-11-27: qty 1

## 2019-11-27 MED ORDER — HYDROMORPHONE HCL 1 MG/ML IJ SOLN
0.5000 mg | INTRAMUSCULAR | Status: DC | PRN
  Administered 2019-11-27 – 2019-11-28 (×2): 0.5 mg via INTRAVENOUS
  Filled 2019-11-27 (×2): qty 0.5

## 2019-11-27 MED ORDER — ZOLPIDEM TARTRATE 5 MG PO TABS: 5.0000 mg | ORAL_TABLET | Freq: Every evening | ORAL | Status: DC | PRN

## 2019-11-27 MED ORDER — ONDANSETRON HCL 4 MG PO TABS: 4.0000 mg | ORAL_TABLET | Freq: Four times a day (QID) | ORAL | Status: DC | PRN

## 2019-11-27 MED ORDER — PROMETHAZINE HCL 25 MG/ML IJ SOLN: 6.2500 mg | INTRAMUSCULAR | Status: DC | PRN

## 2019-11-27 MED ORDER — BUPIVACAINE HCL (PF) 0.5 % IJ SOLN
INTRAMUSCULAR | Status: DC | PRN
  Administered 2019-11-27: 5 mL

## 2019-11-27 MED ORDER — ROCURONIUM BROMIDE 50 MG/5ML IV SOSY
PREFILLED_SYRINGE | INTRAVENOUS | Status: DC | PRN
  Administered 2019-11-27: 70 mg via INTRAVENOUS

## 2019-11-27 MED ORDER — METHOCARBAMOL 500 MG PO TABS
ORAL_TABLET | ORAL | Status: AC
  Filled 2019-11-27: qty 1

## 2019-11-27 MED ORDER — SERTRALINE HCL 50 MG PO TABS
100.0000 mg | ORAL_TABLET | Freq: Every day | ORAL | Status: DC
  Administered 2019-11-27 – 2019-11-28 (×2): 100 mg via ORAL
  Filled 2019-11-27 (×2): qty 2

## 2019-11-27 MED ORDER — LIDOCAINE 2% (20 MG/ML) 5 ML SYRINGE
INTRAMUSCULAR | Status: AC
  Filled 2019-11-27: qty 5

## 2019-11-27 MED ORDER — PHENOL 1.4 % MT LIQD: 1.0000 | OROMUCOSAL | Status: DC | PRN

## 2019-11-27 MED ORDER — DEXAMETHASONE SODIUM PHOSPHATE 10 MG/ML IJ SOLN
INTRAMUSCULAR | Status: DC | PRN
  Administered 2019-11-27: 10 mg via INTRAVENOUS

## 2019-11-27 MED ORDER — ACETAMINOPHEN 325 MG PO TABS: 650.0000 mg | ORAL_TABLET | ORAL | Status: DC | PRN

## 2019-11-27 MED ORDER — METHYLPREDNISOLONE ACETATE 80 MG/ML IJ SUSP
INTRAMUSCULAR | Status: AC
  Filled 2019-11-27: qty 1

## 2019-11-27 MED ORDER — SODIUM CHLORIDE 0.9% FLUSH: 3.0000 mL | INTRAVENOUS | Status: DC | PRN

## 2019-11-27 MED ORDER — PHENYLEPHRINE HCL-NACL 10-0.9 MG/250ML-% IV SOLN
INTRAVENOUS | Status: DC | PRN
  Administered 2019-11-27: 30 ug/min via INTRAVENOUS

## 2019-11-27 MED ORDER — DEXAMETHASONE SODIUM PHOSPHATE 10 MG/ML IJ SOLN
INTRAMUSCULAR | Status: AC
  Filled 2019-11-27: qty 1

## 2019-11-27 MED ORDER — CARBIDOPA-LEVODOPA ER 50-200 MG PO TBCR
1.0000 | EXTENDED_RELEASE_TABLET | Freq: Every day | ORAL | Status: DC
  Administered 2019-11-27: 1 via ORAL
  Filled 2019-11-27: qty 1

## 2019-11-27 MED ORDER — VANCOMYCIN HCL 1000 MG IV SOLR
INTRAVENOUS | Status: DC | PRN
  Administered 2019-11-27: 1000 mg via INTRAVENOUS

## 2019-11-27 MED ORDER — LIDOCAINE-EPINEPHRINE 1 %-1:100000 IJ SOLN
INTRAMUSCULAR | Status: DC | PRN
  Administered 2019-11-27: 5 mL

## 2019-11-27 MED ORDER — LIDOCAINE 2% (20 MG/ML) 5 ML SYRINGE
INTRAMUSCULAR | Status: DC | PRN
  Administered 2019-11-27: 80 mg via INTRAVENOUS

## 2019-11-27 MED ORDER — TRAMADOL HCL 50 MG PO TABS: 50.0000 mg | ORAL_TABLET | Freq: Four times a day (QID) | ORAL | Status: DC | PRN

## 2019-11-27 MED ORDER — CEFAZOLIN SODIUM-DEXTROSE 2-4 GM/100ML-% IV SOLN: 2.0000 g | INTRAVENOUS | Status: DC

## 2019-11-27 MED ORDER — KCL IN DEXTROSE-NACL 20-5-0.45 MEQ/L-%-% IV SOLN: INTRAVENOUS | Status: DC

## 2019-11-27 MED ORDER — BISACODYL 10 MG RE SUPP: 10.0000 mg | Freq: Every day | RECTAL | Status: DC | PRN

## 2019-11-27 MED ORDER — METHOCARBAMOL 1000 MG/10ML IJ SOLN
500.0000 mg | Freq: Four times a day (QID) | INTRAVENOUS | Status: DC | PRN
  Filled 2019-11-27: qty 5

## 2019-11-27 MED ORDER — MENTHOL 3 MG MT LOZG: 1.0000 | LOZENGE | OROMUCOSAL | Status: DC | PRN

## 2019-11-27 MED ORDER — METHYLPREDNISOLONE ACETATE 80 MG/ML IJ SUSP
INTRAMUSCULAR | Status: DC | PRN
  Administered 2019-11-27: 80 mg

## 2019-11-27 MED ORDER — SUGAMMADEX SODIUM 200 MG/2ML IV SOLN
INTRAVENOUS | Status: DC | PRN
  Administered 2019-11-27: 150 mg via INTRAVENOUS

## 2019-11-27 MED ORDER — FENTANYL CITRATE (PF) 100 MCG/2ML IJ SOLN
INTRAMUSCULAR | Status: DC | PRN
  Administered 2019-11-27: 100 ug via INTRAVENOUS

## 2019-11-27 MED ORDER — CLONAZEPAM 0.5 MG PO TABS
0.5000 mg | ORAL_TABLET | Freq: Two times a day (BID) | ORAL | Status: DC | PRN
  Administered 2019-11-27: 0.5 mg via ORAL
  Filled 2019-11-27: qty 1

## 2019-11-27 MED ORDER — PROPOFOL 10 MG/ML IV BOLUS
INTRAVENOUS | Status: DC | PRN
  Administered 2019-11-27: 100 mg via INTRAVENOUS

## 2019-11-27 MED ORDER — OXYCODONE HCL 5 MG PO TABS: 5.0000 mg | ORAL_TABLET | ORAL | Status: DC | PRN

## 2019-11-27 SURGICAL SUPPLY — 57 items
BLADE CLIPPER SURG (BLADE) IMPLANT
BUR MATCHSTICK NEURO 3.0 LAGG (BURR) ×2 IMPLANT
BUR ROUND FLUTED 5 RND (BURR) ×2 IMPLANT
CANISTER SUCT 3000ML PPV (MISCELLANEOUS) ×2 IMPLANT
CARTRIDGE OIL MAESTRO DRILL (MISCELLANEOUS) ×1 IMPLANT
COVER WAND RF STERILE (DRAPES) ×2 IMPLANT
DECANTER SPIKE VIAL GLASS SM (MISCELLANEOUS) ×2 IMPLANT
DERMABOND ADVANCED (GAUZE/BANDAGES/DRESSINGS) ×1
DERMABOND ADVANCED .7 DNX12 (GAUZE/BANDAGES/DRESSINGS) ×1 IMPLANT
DIFFUSER DRILL AIR PNEUMATIC (MISCELLANEOUS) ×2 IMPLANT
DRAPE LAPAROTOMY 100X72X124 (DRAPES) ×2 IMPLANT
DRAPE MICROSCOPE LEICA (MISCELLANEOUS) ×2 IMPLANT
DRAPE SURG 17X23 STRL (DRAPES) ×2 IMPLANT
DRSG OPSITE POSTOP 3X4 (GAUZE/BANDAGES/DRESSINGS) ×2 IMPLANT
DURAPREP 26ML APPLICATOR (WOUND CARE) ×2 IMPLANT
ELECT REM PT RETURN 9FT ADLT (ELECTROSURGICAL) ×2
ELECTRODE REM PT RTRN 9FT ADLT (ELECTROSURGICAL) ×1 IMPLANT
GAUZE 4X4 16PLY RFD (DISPOSABLE) IMPLANT
GAUZE SPONGE 4X4 12PLY STRL (GAUZE/BANDAGES/DRESSINGS) IMPLANT
GLOVE BIO SURGEON STRL SZ7.5 (GLOVE) ×2 IMPLANT
GLOVE BIO SURGEON STRL SZ8 (GLOVE) ×2 IMPLANT
GLOVE BIOGEL PI IND STRL 6.5 (GLOVE) ×2 IMPLANT
GLOVE BIOGEL PI IND STRL 7.5 (GLOVE) ×1 IMPLANT
GLOVE BIOGEL PI IND STRL 8 (GLOVE) ×1 IMPLANT
GLOVE BIOGEL PI IND STRL 8.5 (GLOVE) ×1 IMPLANT
GLOVE BIOGEL PI INDICATOR 6.5 (GLOVE) ×2
GLOVE BIOGEL PI INDICATOR 7.5 (GLOVE) ×1
GLOVE BIOGEL PI INDICATOR 8 (GLOVE) ×1
GLOVE BIOGEL PI INDICATOR 8.5 (GLOVE) ×1
GLOVE ECLIPSE 8.0 STRL XLNG CF (GLOVE) ×2 IMPLANT
GLOVE EXAM NITRILE XL STR (GLOVE) IMPLANT
GLOVE SURG SS PI 6.0 STRL IVOR (GLOVE) ×4 IMPLANT
GOWN STRL REUS W/ TWL LRG LVL3 (GOWN DISPOSABLE) ×3 IMPLANT
GOWN STRL REUS W/ TWL XL LVL3 (GOWN DISPOSABLE) ×1 IMPLANT
GOWN STRL REUS W/TWL 2XL LVL3 (GOWN DISPOSABLE) ×2 IMPLANT
GOWN STRL REUS W/TWL LRG LVL3 (GOWN DISPOSABLE) ×3
GOWN STRL REUS W/TWL XL LVL3 (GOWN DISPOSABLE) ×1
HEMOSTAT POWDER KIT SURGIFOAM (HEMOSTASIS) ×2 IMPLANT
KIT BASIN OR (CUSTOM PROCEDURE TRAY) ×2 IMPLANT
KIT TURNOVER KIT B (KITS) ×2 IMPLANT
NEEDLE HYPO 18GX1.5 BLUNT FILL (NEEDLE) IMPLANT
NEEDLE HYPO 25X1 1.5 SAFETY (NEEDLE) ×2 IMPLANT
NEEDLE SPNL 18GX3.5 QUINCKE PK (NEEDLE) ×2 IMPLANT
NS IRRIG 1000ML POUR BTL (IV SOLUTION) ×2 IMPLANT
OIL CARTRIDGE MAESTRO DRILL (MISCELLANEOUS) ×2
PACK LAMINECTOMY NEURO (CUSTOM PROCEDURE TRAY) ×2 IMPLANT
PAD ARMBOARD 7.5X6 YLW CONV (MISCELLANEOUS) ×6 IMPLANT
RUBBERBAND STERILE (MISCELLANEOUS) ×4 IMPLANT
SPONGE SURGIFOAM ABS GEL SZ50 (HEMOSTASIS) IMPLANT
SUT VIC AB 0 CT1 18XCR BRD8 (SUTURE) ×1 IMPLANT
SUT VIC AB 0 CT1 8-18 (SUTURE) ×1
SUT VIC AB 2-0 CT1 18 (SUTURE) ×2 IMPLANT
SUT VIC AB 3-0 SH 8-18 (SUTURE) ×2 IMPLANT
SYR 5ML LL (SYRINGE) IMPLANT
TOWEL GREEN STERILE (TOWEL DISPOSABLE) ×2 IMPLANT
TOWEL GREEN STERILE FF (TOWEL DISPOSABLE) ×2 IMPLANT
WATER STERILE IRR 1000ML POUR (IV SOLUTION) ×2 IMPLANT

## 2019-11-27 NOTE — Progress Notes (Signed)
Patient stated she had an allergy to idodine, however her MRSA PCR was positive for staph. Dr. Vertell Limber notified with orders not to give Profend and to change her ABX from Ancef to Vanc.

## 2019-11-27 NOTE — Anesthesia Postprocedure Evaluation (Signed)
Anesthesia Post Note  Patient: [name]  Procedure(s) Performed: Right Lumbar Four-Five  foraminotomy (Right )     Patient location during evaluation: PACU Anesthesia Type: General Level of consciousness: awake and alert Pain management: pain level controlled Vital Signs Assessment: post-procedure vital signs reviewed and stable Respiratory status: spontaneous breathing, nonlabored ventilation, respiratory function stable and patient connected to nasal cannula oxygen Cardiovascular status: blood pressure returned to baseline and stable Postop Assessment: no apparent nausea or vomiting Anesthetic complications: no    Last Vitals:  Vitals:   11/27/19 1410 11/27/19 1437  BP:  (!) 145/76  Pulse: 79 81  Resp: 20 16  Temp: 36.4 C (!) 36.1 C  SpO2: 94% 96%    Last Pain:  Vitals:   11/27/19 1335  TempSrc:   PainSc: 10-Worst pain ever                 Murtaza Shell S

## 2019-11-27 NOTE — Progress Notes (Signed)
Awake, alert, conversant.  MAEW with good bilateral DF/PF/EHL strength.  Doing well.

## 2019-11-27 NOTE — Evaluation (Signed)
Physical Therapy Evaluation Patient Details Name: [name] MRN: [id number] DOB: 1946/01/25 Today's Date: 11/27/2019   History of Present Illness  73 yo female s/p L4-L5 foraminotomy on 11/27/19. PMH includes parkinsons disease, takotsubo cardiomyopathy, RLE and RUE reflex sympathetic dystrophy, osteoporosis, lupus, fibromyalgia, CHF, anxiety, OA.  Clinical Impression   Pt presents with generalized weakness with history of falling, difficulty performing mobility tasks, unsteadiness in standing, decreased knowledge of spinal precautions, and decreased activity tolerance limited by pain and fatigue. Pt to benefit from acute PT to address deficits. Pt ambulated 25 ft total with min guard assist and use of RW, pt limited in distance by fatigue and urinary incotinence issues. Pt educated on ankle pumps  to perform this afternoon/evening to increase circulation, to pt's tolerance and limited by pain. PT to progress mobility as tolerated, and will continue to follow acutely.        Follow Up Recommendations Supervision for mobility/OOB;Home health PT    Equipment Recommendations  None recommended by PT    Recommendations for Other Services       Precautions / Restrictions Precautions Precautions: Fall;Back Precaution Booklet Issued: Yes (comment) Precaution Comments: verbally reviewed no bending, twisting, arching, lifting with pt, as well as practiced log roll technique in and out of bed Restrictions Weight Bearing Restrictions: No      Mobility  Bed Mobility Overal bed mobility: Needs Assistance Bed Mobility: Rolling;Sidelying to Sit;Sit to Supine Rolling: Min assist Sidelying to sit: Min assist   Sit to supine: Min assist;+2 for safety/equipment   General bed mobility comments: min assist for log roll technique, especially trunk elevation off of bed. Verbal cuing for proper sequencing and reaching with UE toward opposite rail during rolling. Pt soiled in  urine.  Transfers Overall transfer level: Needs assistance Equipment used: Rolling walker (2 wheeled) Transfers: Sit to/from Stand Sit to Stand: Min assist         General transfer comment: min assist for steadying, verbal cuing for hand placement when rising/sitting. Sit to stand x2, from bed and toilet.  Ambulation/Gait Ambulation/Gait assistance: Min guard Gait Distance (Feet): 25 Feet Assistive device: Rolling walker (2 wheeled) Gait Pattern/deviations: Step-through pattern;Decreased stride length;Shuffle;Narrow base of support Gait velocity: decr   General Gait Details: Min guard for safety, verbal cuing for placement in and navigation of RW. Pt limited in gait distance by dizziness and incotinence of urine, pt returned to supine.  Stairs            Wheelchair Mobility    Modified Rankin (Stroke Patients Only)       Balance Overall balance assessment: Needs assistance;History of Falls Sitting-balance support: No upper extremity supported;Feet supported Sitting balance-Leahy Scale: Good     Standing balance support: Bilateral upper extremity supported;During functional activity Standing balance-Leahy Scale: Poor                               Pertinent Vitals/Pain Pain Assessment: Faces Faces Pain Scale: Hurts little more Pain Location: back Pain Descriptors / Indicators: Sore Pain Intervention(s): Limited activity within patient's tolerance;Monitored during session;Repositioned;Patient requesting pain meds-RN notified    Home Living Family/patient expects to be discharged to:: Private residence Living Arrangements: Other relatives(will be d/c to sister's house, below information about pt's sister's home. Pt typically lives with her children) Available Help at Discharge: Family;Available 24 hours/day Type of Home: House Home Access: Ramped entrance     Home Layout: One level Home Equipment:  Walker - 2 wheels;Walker - 4 wheels;Cane - single  point;Bedside commode;Wheelchair - manual      Prior Function Level of Independence: Needs assistance   Gait / Transfers Assistance Needed: pt used cane for ambulation PTA  ADL's / Homemaking Assistance Needed: Pt states she was independent in dressing, bathing, toileting PTA, but states she had a fall 2 weeks ago "into the tub shower"        Hand Dominance   Dominant Hand: Right    Extremity/Trunk Assessment   Upper Extremity Assessment Upper Extremity Assessment: Generalized weakness    Lower Extremity Assessment Lower Extremity Assessment: Generalized weakness    Cervical / Trunk Assessment Cervical / Trunk Assessment: Kyphotic  Communication   Communication: No difficulties  Cognition Arousal/Alertness: Awake/alert Behavior During Therapy: Flat affect Overall Cognitive Status: Within Functional Limits for tasks assessed                                 General Comments: Pt with increased processing time required to answer questions/respond to PT cuing      General Comments      Exercises     Assessment/Plan    PT Assessment Patient needs continued PT services  PT Problem List Decreased strength;Decreased mobility;Decreased balance;Decreased activity tolerance;Decreased knowledge of use of DME;Pain       PT Treatment Interventions DME instruction;Therapeutic activities;Gait training;Therapeutic exercise;Patient/family education;Balance training;Functional mobility training    PT Goals (Current goals can be found in the Care Plan section)  Acute Rehab PT Goals Patient Stated Goal: stop the pain PT Goal Formulation: With patient Time For Goal Achievement: 12/04/19 Potential to Achieve Goals: Good    Frequency Min 5X/week   Barriers to discharge        Co-evaluation               AM-PAC PT "6 Clicks" Mobility  Outcome Measure Help needed turning from your back to your side while in a flat bed without using bedrails?: A  Little Help needed moving from lying on your back to sitting on the side of a flat bed without using bedrails?: A Little Help needed moving to and from a bed to a chair (including a wheelchair)?: A Little Help needed standing up from a chair using your arms (e.g., wheelchair or bedside chair)?: A Little Help needed to walk in hospital room?: A Little Help needed climbing 3-5 steps with a railing? : A Little 6 Click Score: 18    End of Session Equipment Utilized During Treatment: Gait belt Activity Tolerance: Patient limited by fatigue;Patient limited by pain Patient left: in bed;with call bell/phone within reach;with nursing/sitter in room;with family/visitor present(needs new SCDs - NT notified) Nurse Communication: Mobility status PT Visit Diagnosis: Unsteadiness on feet (R26.81);History of falling (Z91.81);Difficulty in walking, not elsewhere classified (R26.2);Muscle weakness (generalized) (M62.81)    Time: [id number] PT Time Calculation (min) (ACUTE ONLY): 38 min   Charges:   PT Evaluation $PT Eval Low Complexity: 1 Low PT Treatments $Gait Training: 8-22 mins $Therapeutic Activity: 8-22 mins       Estellar Cadena E, PT Acute Rehabilitation Services Pager [phone]  Office [phone]   Emunah Texidor D Elonda Husky 11/27/2019, 5:53 PM

## 2019-11-27 NOTE — Interval H&P Note (Signed)
History and Physical Interval Note:  11/27/2019 11:33 AM  [name]  has presented today for surgery, with the diagnosis of Disc displacement, Lumbar.  The various methods of treatment have been discussed with the patient and family. After consideration of risks, benefits and other options for treatment, the patient has consented to  Procedure(s) with comments: Right Lumbar 4-5 foraminotomy with possible microdiscectomy (Right) - Right Lumbar 4-5 foraminotomy with possible microdiscectomy as a surgical intervention.  The patient's history has been reviewed, patient examined, no change in status, stable for surgery.  I have reviewed the patient's chart and labs.  Questions were answered to the patient's satisfaction.     Peggyann Shoals

## 2019-11-27 NOTE — Anesthesia Procedure Notes (Signed)
Procedure Name: Intubation Date/Time: 11/27/2019 11:44 AM Performed by: Lance Coon, CRNA Pre-anesthesia Checklist: Patient identified, Emergency Drugs available, Suction available, Patient being monitored and Timeout performed Patient Re-evaluated:Patient Re-evaluated prior to induction Oxygen Delivery Method: Circle system utilized Preoxygenation: Pre-oxygenation with 100% oxygen Induction Type: IV induction Ventilation: Mask ventilation without difficulty Laryngoscope Size: [name] and 2 Grade View: Grade I Tube type: Oral Tube size: 7.0 mm Number of attempts: 1 Airway Equipment and Method: Stylet Placement Confirmation: ETT inserted through vocal cords under direct vision,  positive ETCO2 and breath sounds checked- equal and bilateral Secured at: 21 cm Tube secured with: Tape Dental Injury: Teeth and Oropharynx as per pre-operative assessment

## 2019-11-27 NOTE — Brief Op Note (Signed)
11/27/2019  1:22 PM  PATIENT:  [name]  73 y.o. female  PRE-OPERATIVE DIAGNOSIS:  Degenerative lumbar stenosis, herniated disc, lumbago, radiculopathy L 45 right  POST-OPERATIVE DIAGNOSIS:   Degenerative lumbar stenosis, herniated disc, lumbago, radiculopathy L 45 right   PROCEDURE:  Procedure(s) with comments: Right Lumbar 4-5 foraminotomy with possible microdiscectomy (Right) - Right Lumbar 4-5 foraminotomy   SURGEON:  Surgeon(s) and Role:    Erline Levine, MD - Primary    * Judith Part, MD - Assisting  PHYSICIAN ASSISTANT:   ASSISTANTS: Poteat, RN   ANESTHESIA:   general  EBL:  Minimal  BLOOD ADMINISTERED:none  DRAINS: none   LOCAL MEDICATIONS USED:  MARCAINE    and LIDOCAINE   SPECIMEN:  No Specimen  DISPOSITION OF SPECIMEN:  N/A  COUNTS:  YES  TOURNIQUET:  * No tourniquets in log *  DICTATION: Patient has right foraminal stenosis at L 4-5 with significant right leg weakness and intractable pain. It was elected to take her to surgery for L 45 laminectomy and foraminotomy.  Procedure: Patient was brought to the operating room and following the smooth and uncomplicated induction of general endotracheal anesthesia she was placed in a prone position on the Wilson frame. Low back was prepped and draped in the usual sterile fashion with betadine scrub and DuraPrep. Preop localizing X ray was obtained.  Area of planned incision was infiltrated with local lidocaine. Incision was made in the midline and carried to the lumbodorsal fascia which was incised on the right side of midline. Subperiosteal dissection was performed exposing what was felt to be L45 level. Intraoperative x-ray demonstrated marker probe at L 45 and a second probe at L 5 S 1. A right foraminotomy of L 4 and L 5 was performed with leksell, then a high-speed drill and completed with Kerrison rongeurs and generous foraminotomy was performed to decompress the L4 and L5nerves, utilizing the  operative microscope. Ligamentum flavum was detached and removed in a piecemeal fashion and the right  L5 nerve root was decompressed laterally with removal of the superior aspect of the facet and ligamentum causing nerve root compression. Angled curettes were used to detatch and then remove thickened compressive ligamentous material. There was a bulging disc at this level, but without significant nerve compression, so we elected to not perform a discectomy.   At this point it was felt that all neural elements were well decompressed. The wound was then irrigated with saline. Hemostasis was assured with bipolar electrocautery and the interspace was irrigated with Depo-Medrol and fentanyl. The lumbodorsal fascia was closed with 0 Vicryl sutures the subcutaneous tissues reapproximated 2-0 Vicryl inverted sutures and the skin edges were reapproximated with 3-0 Vicryl subcuticular stitch. The wound is dressed with Dermabond and an occlusive dressing. Patient was extubated in the operating room and taken to recovery in stable and satisfactory condition having tolerated her operation well counts were correct at the end of the case.    PLAN OF CARE: Admit to inpatient   PATIENT DISPOSITION:  PACU - hemodynamically stable.   Delay start of Pharmacological VTE agent (>24hrs) due to surgical blood loss or risk of bleeding: yes

## 2019-11-27 NOTE — Op Note (Signed)
11/27/2019  1:22 PM  PATIENT:  [name]  73 y.o. female  PRE-OPERATIVE DIAGNOSIS:  Degenerative lumbar stenosis, herniated disc, lumbago, radiculopathy L 45 right  POST-OPERATIVE DIAGNOSIS:   Degenerative lumbar stenosis, herniated disc, lumbago, radiculopathy L 45 right   PROCEDURE:  Procedure(s) with comments: Right Lumbar 4-5 foraminotomy with possible microdiscectomy (Right) - Right Lumbar 4-5 foraminotomy   SURGEON:  Surgeon(s) and Role:    Erline Levine, MD - Primary    * Judith Part, MD - Assisting  PHYSICIAN ASSISTANT:   ASSISTANTS: Poteat, RN   ANESTHESIA:   general  EBL:  Minimal  BLOOD ADMINISTERED:none  DRAINS: none   LOCAL MEDICATIONS USED:  MARCAINE    and LIDOCAINE   SPECIMEN:  No Specimen  DISPOSITION OF SPECIMEN:  N/A  COUNTS:  YES  TOURNIQUET:  * No tourniquets in log *  DICTATION: Patient has right foraminal stenosis at L 4-5 with significant right leg weakness and intractable pain. It was elected to take her to surgery for L 45 laminectomy and foraminotomy.  Procedure: Patient was brought to the operating room and following the smooth and uncomplicated induction of general endotracheal anesthesia she was placed in a prone position on the Wilson frame. Low back was prepped and draped in the usual sterile fashion with betadine scrub and DuraPrep. Preop localizing X ray was obtained.  Area of planned incision was infiltrated with local lidocaine. Incision was made in the midline and carried to the lumbodorsal fascia which was incised on the right side of midline. Subperiosteal dissection was performed exposing what was felt to be L45 level. Intraoperative x-ray demonstrated marker probe at L 45 and a second probe at L 5 S 1. A right foraminotomy of L 4 and L 5 was performed with leksell, then a high-speed drill and completed with Kerrison rongeurs and generous foraminotomy was performed to decompress the L4 and L5nerves, utilizing the  operative microscope. Ligamentum flavum was detached and removed in a piecemeal fashion and the right  L5 nerve root was decompressed laterally with removal of the superior aspect of the facet and ligamentum causing nerve root compression. Angled curettes were used to detatch and then remove thickened compressive ligamentous material. There was a bulging disc at this level, but without significant nerve compression, so we elected to not perform a discectomy.   At this point it was felt that all neural elements were well decompressed. The wound was then irrigated with saline. Hemostasis was assured with bipolar electrocautery and the interspace was irrigated with Depo-Medrol and fentanyl. The lumbodorsal fascia was closed with 0 Vicryl sutures the subcutaneous tissues reapproximated 2-0 Vicryl inverted sutures and the skin edges were reapproximated with 3-0 Vicryl subcuticular stitch. The wound is dressed with Dermabond and an occlusive dressing. Patient was extubated in the operating room and taken to recovery in stable and satisfactory condition having tolerated her operation well counts were correct at the end of the case.    PLAN OF CARE: Admit to inpatient   PATIENT DISPOSITION:  PACU - hemodynamically stable.   Delay start of Pharmacological VTE agent (>24hrs) due to surgical blood loss or risk of bleeding: yes

## 2019-11-27 NOTE — Progress Notes (Addendum)
Patient ID: [name], female   DOB: 1946-06-04, 73 y.o.   MRN: [id number] Alert, conversant. Reports mild lumbar incisional pain, minimal leg pain. No numbness. Good strength BLE.   Patient is doing well.

## 2019-11-27 NOTE — H&P (Signed)
Patient ID:   [id number] Patient: [name]  Date of Birth: [birth year] Visit Type: Office Visit   Date: 11/10/2019 02:45 PM Provider: Marchia Meiers. Vertell Limber MD   This 73 year old female presents for MRI review.  HISTORY OF PRESENT ILLNESS: 1.  MRI review  Patient returns to review her MRI and lumbar/hip imaging  Patient has right dorsiflexion weakness 4/5.  She has foraminal stenosis and L5 nerve root compression on the right at the L4-5 level  I have recommend right L4-5 foraminotomy with possible microdiskectomy  The patient is interested in pursuing this option.  She grades her pain at 8/10 in severity.  I discussed with the patient and her husband the issues of her Parkinson's disease and is impact on back and also how she has scoliosis and other contributing factors that her affecting her lumbar spine.  I have recommended a much more minimal surgical approach to help relieve the nerve root compression.  This will not address all of her other concerns but should hopefully relieve the nerve compression and allow her to function in much less pain and with less weakness.      Medical/Surgical/Interim History Reviewed, no change.  Last detailed document date:10/20/2019.     PAST MEDICAL HISTORY, SURGICAL HISTORY, FAMILY HISTORY, SOCIAL HISTORY AND REVIEW OF SYSTEMS I have reviewed the patient's past medical, surgical, family and social history as well as the comprehensive review of systems as included on the [facility] history form dated 10/20/2019, which I have signed.  Family History: Reviewed, no changes.  Last detailed document date:10/20/2019.   Social History: Reviewed, no changes. Last detailed document date: 10/20/2019.    MEDICATIONS: (added, continued or stopped this visit) Started Medication Directions Instruction Stopped  alfuzosin ER 10 mg tablet,extended release 24 hr     B-12 Compliance 1,000 mcg/mL injection  kit     CALCIUM PETITES     carbidopa ER 25 mg-levodopa 100 mg tablet,extended release     carbidopa ER 50 mg-levodopa 200 mg tablet,extended release     clonazepam 0.5 mg tablet     Dry Eye Relief     Easy Touch 1 mL 25 gauge x 5/8" syringe     gabapentin 100 mg capsule     latanoprost 0.005 % eye drops     Monoject Syringe 3 mL 25 x 1 1/4"     Northera 100 mg capsule     Northera 300 mg capsule     polyethylene glycol 3350 17 gram oral powder packet     pramipexole 1 mg tablet     sertraline 100 mg tablet     tramadol 50 mg tablet     triamcinolone acetonide 0.1 % topical cream     VITAMIN C     Vitamin D3  ORAL       ALLERGIES: Ingredient Reaction Medication Name Comment IMIPRAMINE    CIPROFLOXACIN    BACITRACIN  Neosporin (neo-bac-polym)  LIDOCAINE    TETRACYCLINE    BACITRACIN ZINC  Neosporin (neo-bac-polym)  PREGABALIN  Lyrica  SULFA (SULFONAMIDE ANTIBIOTICS)    POLYMYXIN B  Neosporin (neo-bac-polym)  MORPHINE SULFATE    DULOXETINE HCL  Cymbalta  NEOMYCIN SULFATE  Neosporin (neo-bac-polym)  AMITRIPTYLINE    IOHEXOL     Reviewed, no changes.    PHYSICAL EXAM:  Vitals Date Temp F BP Pulse Ht In Wt Lb BMI BSA Pain Score 11/10/2019 96.4 125/73 79 60.5 163.8 31.46  8/10     IMPRESSION:  Right L5 radiculopathy due to right L4-5 foraminal stenosis  PLAN: Proceed with right L4-5 foraminotomy and possible microdiskectomy.  Patient education was performed and risks and benefits were discussed in detail with the patient.  She wishes to proceed with surgery.  Orders: Instruction(s)/Education: Assessment Instruction R03.0 Lifestyle education Z68.31 Dietary management education, guidance, and counseling  Assessment/Plan  # Detail Type Description  1. Assessment Radiculopathy, lumbar region (M54.16).      2. Assessment Idiopathic scoliosis of lumbar region (M41.26).     3. Assessment Low back pain, unspecified back pain laterality, with sciatica presence unspecified (M54.5).     4. Assessment Disc displacement, lumbar (M51.26).     5. Assessment Degenerative lumbar spinal stenosis (M48.061).     6. Assessment Elevated blood-pressure reading, w/o diagnosis of htn (R03.0).     7. Assessment Body mass index (BMI) 31.0-31.9, adult (Z68.31).  Plan Orders Today's instructions / counseling include(s) Dietary management education, guidance, and counseling. Clinical information/comments: Encouraged patient to eat well balanced diet.       Pain Management Plan Pain Scale: 8/10. Method: Numeric Pain Intensity Scale. Location: back. Onset: 10/20/2019. Duration: varies. Quality: discomforting. Pain management follow-up plan of care: Patient will continue medication management..              Provider:  Marchia Meiers. Vertell Limber MD  11/20/2019 11:01 AM    Dictation edited by: Marchia Meiers. Vertell Limber    CC Providers: [name]  [address] [place], [place] [id number]               Electronically signed by Marchia Meiers Vertell Limber MD on 11/20/2019 11:01 AM

## 2019-11-27 NOTE — Transfer of Care (Signed)
Immediate Anesthesia Transfer of Care Note  Patient: [name]  Procedure(s) Performed: Right Lumbar Four-Five  foraminotomy (Right )  Patient Location: PACU  Anesthesia Type:General  Level of Consciousness: drowsy and patient cooperative  Airway & Oxygen Therapy: Patient Spontanous Breathing  Post-op Assessment: Report given to RN and Post -op Vital signs reviewed and stable  Post vital signs: Reviewed and stable  Last Vitals:  Vitals Value Taken Time  BP 124/109 11/27/19 1336  Temp    Pulse 86 11/27/19 1337  Resp 9 11/27/19 1337  SpO2 97 % 11/27/19 1337  Vitals shown include unvalidated device data.  Last Pain:  Vitals:   11/27/19 0944  TempSrc:   PainSc: 8       Patients Stated Pain Goal: 3 ([id number])  Complications: No apparent anesthesia complications

## 2019-11-28 DIAGNOSIS — R531 Weakness: Secondary | ICD-10-CM | POA: Diagnosis not present

## 2019-11-28 DIAGNOSIS — Z79899 Other long term (current) drug therapy: Secondary | ICD-10-CM | POA: Diagnosis not present

## 2019-11-28 DIAGNOSIS — K219 Gastro-esophageal reflux disease without esophagitis: Secondary | ICD-10-CM | POA: Diagnosis not present

## 2019-11-28 DIAGNOSIS — M48061 Spinal stenosis, lumbar region without neurogenic claudication: Secondary | ICD-10-CM | POA: Diagnosis present

## 2019-11-28 DIAGNOSIS — Z882 Allergy status to sulfonamides status: Secondary | ICD-10-CM | POA: Diagnosis not present

## 2019-11-28 DIAGNOSIS — M4126 Other idiopathic scoliosis, lumbar region: Secondary | ICD-10-CM | POA: Diagnosis not present

## 2019-11-28 DIAGNOSIS — R69 Illness, unspecified: Secondary | ICD-10-CM | POA: Diagnosis not present

## 2019-11-28 DIAGNOSIS — F419 Anxiety disorder, unspecified: Secondary | ICD-10-CM | POA: Diagnosis not present

## 2019-11-28 DIAGNOSIS — Z888 Allergy status to other drugs, medicaments and biological substances status: Secondary | ICD-10-CM | POA: Diagnosis not present

## 2019-11-28 DIAGNOSIS — R030 Elevated blood-pressure reading, without diagnosis of hypertension: Secondary | ICD-10-CM | POA: Diagnosis not present

## 2019-11-28 DIAGNOSIS — Z885 Allergy status to narcotic agent status: Secondary | ICD-10-CM | POA: Diagnosis not present

## 2019-11-28 DIAGNOSIS — Z881 Allergy status to other antibiotic agents status: Secondary | ICD-10-CM | POA: Diagnosis not present

## 2019-11-28 DIAGNOSIS — F329 Major depressive disorder, single episode, unspecified: Secondary | ICD-10-CM | POA: Diagnosis not present

## 2019-11-28 DIAGNOSIS — M5116 Intervertebral disc disorders with radiculopathy, lumbar region: Secondary | ICD-10-CM | POA: Diagnosis not present

## 2019-11-28 MED ORDER — TIZANIDINE HCL 4 MG PO TABS: 4.0000 mg | ORAL_TABLET | Freq: Four times a day (QID) | ORAL | 0 refills | Status: DC | PRN

## 2019-11-28 MED ORDER — HYDROCODONE-ACETAMINOPHEN 5-325 MG PO TABS: 1.0000 | ORAL_TABLET | ORAL | 0 refills | Status: AC | PRN

## 2019-11-28 NOTE — Progress Notes (Signed)
Physical Therapy Treatment Patient Details Name: [name] MRN: [id number] DOB: Apr 10, 1946 Today's Date: 11/28/2019    History of Present Illness 74 yo female s/p L4-L5 foraminotomy on 11/27/19. PMH includes parkinsons disease, takotsubo cardiomyopathy, RLE and RUE reflex sympathetic dystrophy, osteoporosis, lupus, fibromyalgia, CHF, anxiety, OA.    PT Comments    Pt with much improved processing time and command following this session, and mobilized with min guard to supervision level of assist. Pt noted to have more athetoid RUE movement this session, with some facial dyskinesia noted as well which was not present last night. Pt states this is baseline, and did not affect pt mobility. Pt able to verbalize all back precautions and demonstrate application of precautions during transfers and ambulation. Pt to d/c home today, and pt encouraged to have supervision assist upon d/c from sisters/children for safety.    Follow Up Recommendations  Supervision for mobility/OOB;Home health PT     Equipment Recommendations  None recommended by PT    Recommendations for Other Services       Precautions / Restrictions Precautions Precautions: Fall;Back Precaution Booklet Issued: Yes (comment) Precaution Comments: pt able to state "no bending, lifting, twisting, or arching" precautions, and demonstrates appropriate application of back precautions during mobility Restrictions Weight Bearing Restrictions: No    Mobility  Bed Mobility Overal bed mobility: Needs Assistance Bed Mobility: Rolling;Sidelying to Sit;Sit to Supine Rolling: Min assist Sidelying to sit: Min assist   Sit to supine: Min assist;+2 for safety/equipment   General bed mobility comments: pt up in chair upon arrival to room, requests back to chair upon PT exit.  Transfers Overall transfer level: Needs assistance Equipment used: Rolling walker (2 wheeled) Transfers: Sit to/from Stand Sit to Stand: Supervision          General transfer comment: supervision for safety, pt with appropriate hand placement when rising/sitting.  Ambulation/Gait Ambulation/Gait assistance: Supervision Gait Distance (Feet): 150 Feet Assistive device: Rolling walker (2 wheeled) Gait Pattern/deviations: Step-through pattern;Decreased stride length;Shuffle Gait velocity: decr   General Gait Details: min guard to supervision for safety, x1 verbal cuing for upright posture and positioning in RW. Pt with decreased shuffling present today vs on eval.   Stairs             Wheelchair Mobility    Modified Rankin (Stroke Patients Only)       Balance Overall balance assessment: Needs assistance;History of Falls Sitting-balance support: No upper extremity supported;Feet supported Sitting balance-Leahy Scale: Good     Standing balance support: Bilateral upper extremity supported;During functional activity Standing balance-Leahy Scale: Fair Standing balance comment: reliance on RW and again educated no cane at this time. Pt asking about use of cane from therapist.                             Cognition Arousal/Alertness: Awake/alert Behavior During Therapy: Flat affect Overall Cognitive Status: Within Functional Limits for tasks assessed                                 General Comments: Pt with athetoid-like UE movements, and tardive dyskinesia-like facial movements. Pt states this is baseline, not observed on eval      Exercises      General Comments General comments (skin integrity, edema, etc.): dressing dry and intact. pt dressed for home during session. pt able to button tshirt dress  Pertinent Vitals/Pain Pain Assessment: 0-10 Pain Score: 8  Faces Pain Scale: Hurts little more Pain Location: back Pain Descriptors / Indicators: Sore Pain Intervention(s): Limited activity within patient's tolerance;Monitored during session;Repositioned;Patient requesting pain meds-RN  notified    Home Living Family/patient expects to be discharged to:: Private residence Living Arrangements: Other relatives(will be d/c to sister's house, below information about pt's sister's home. Pt typically lives with her children) Available Help at Discharge: Family;Available 24 hours/day Type of Home: House Home Access: Ramped entrance   Home Layout: One level Home Equipment: Walker - 2 wheels;Walker - 4 wheels;Cane - single point;Wheelchair - manual;Grab bars - tub/shower Additional Comments: will dc to sisters house and sister with previous back surgery so has AE and 3n1 in her home. Pt requesting DME for her personal home.    Prior Function Level of Independence: Needs assistance  Gait / Transfers Assistance Needed: pt used cane for ambulation PTA ADL's / Homemaking Assistance Needed: Pt states she was independent in dressing, bathing, toileting PTA, but states she had a fall 2 weeks ago "into the tub shower" Comments: reports hx of falls in bathroom especially with tub transfer. Recommendation is a bench at this time   PT Goals (current goals can now be found in the care plan section) Acute Rehab PT Goals Patient Stated Goal: stop the pain PT Goal Formulation: With patient Time For Goal Achievement: 12/04/19 Potential to Achieve Goals: Good Progress towards PT goals: Progressing toward goals    Frequency    Min 5X/week      PT Plan Current plan remains appropriate    Co-evaluation              AM-PAC PT "6 Clicks" Mobility   Outcome Measure  Help needed turning from your back to your side while in a flat bed without using bedrails?: A Little Help needed moving from lying on your back to sitting on the side of a flat bed without using bedrails?: A Little Help needed moving to and from a bed to a chair (including a wheelchair)?: A Little Help needed standing up from a chair using your arms (e.g., wheelchair or bedside chair)?: A Little Help needed to walk in  hospital room?: A Little Help needed climbing 3-5 steps with a railing? : A Little 6 Click Score: 18    End of Session Equipment Utilized During Treatment: Gait belt Activity Tolerance: Patient limited by fatigue;Patient limited by pain Patient left: with call bell/phone within reach;in chair Nurse Communication: Mobility status PT Visit Diagnosis: Unsteadiness on feet (R26.81);History of falling (Z91.81);Difficulty in walking, not elsewhere classified (R26.2);Muscle weakness (generalized) (M62.81)     Time: [id number] PT Time Calculation (min) (ACUTE ONLY): 14 min  Charges:  $Gait Training: 8-22 mins                    Sedonia Kitner E, PT [place] Pager [phone]  Office [phone]   Rielynn Trulson D Stewart 11/28/2019, 11:23 AM

## 2019-11-28 NOTE — Discharge Instructions (Signed)

## 2019-11-28 NOTE — Discharge Summary (Signed)
Physician Discharge Summary  Patient ID: [name] MRN: [id number] DOB/AGE: [birth year] 74 y.o.  Admit date: 11/27/2019 Discharge date: 11/28/2019  Admission Diagnoses:Degenerative lumbar stenosis, herniated disc, lumbago, radiculopathy L 45 right    Discharge Diagnoses:Degenerative lumbar stenosis, herniated disc, lumbago, radiculopathy L 45 right    Active Problems:   Degenerative lumbar spinal stenosis   Discharged Condition: good  Hospital Course: [name] was admitted and taken to the operating room for an uncomplicated lumbar decompression. Post op she is ambulating, voiding, and tolerating a regular diet.   Treatments: surgery: Right Lumbar 4-5 foraminotomy with possible microdiscectomy (Right) - Right Lumbar 4-5 foraminotomy    Discharge Exam: Blood pressure (!) 89/61, pulse 93, temperature 98.6 F (37 C), temperature source Oral, resp. rate 16, height 5\' 6"  (1.676 m), weight 73.5 kg, SpO2 96 %. General appearance: alert, cooperative, appears stated age and no distress  Moving all extremities well. Wound is clean,dry, and without signs of infection.   Disposition: Discharge disposition: 01-Home or Self Care      Disc displacement, Lumbar  Allergies as of 11/28/2019      Reactions   Amitriptyline Other (See Comments)   Sedated next morning   Ciprofloxacin Nausea And Vomiting   Cymbalta [duloxetine Hcl] Other (See Comments)   Worsened depression   Imipramine Hcl    REACTION: rash   Iohexol     Code: HIVES, Desc: PT developed 2 hives, followed by SOB, severe headache post 87cc's Omnipaque 300., Onset Date: [id number]   Lyrica [pregabalin] Other (See Comments)   Tried during hospitalization - unsure effects but unable to tolerate   Morphine Sulfate    REACTION: rash   Sulfamethoxazole    REACTION: rash   Iodine Rash   Lidocaine Hcl Rash   Neosporin [neomycin-bacitracin Zn-polymyx] Rash   Worsened skin breaking out   Tetracyclines & Related Rash       Medication List    TAKE these medications   alfuzosin 10 MG 24 hr tablet Commonly known as: UROXATRAL Take 10 mg by mouth daily.   Calcium Carbonate-Vitamin D 600-400 MG-UNIT chew tablet Commonly known as: Calcium 600/Vitamin D Chew 1 tablet by mouth daily.   carbidopa-levodopa 25-100 MG tablet Commonly known as: SINEMET IR Take 1 tablet by mouth 4 (four) times daily. What changed:   how much to take  when to take this  additional instructions   carbidopa-levodopa 50-200 MG tablet Commonly known as: SINEMET CR TAKE 1 TABLET BY MOUTH EVERYDAY AT BEDTIME What changed: Another medication with the same name was changed. Make sure you understand how and when to take each.   clonazePAM 0.5 MG tablet Commonly known as: KLONOPIN Take 1 tablet (0.5 mg total) by mouth 2 (two) times daily as needed. What changed: reasons to take this   cyanocobalamin 1000 MCG/ML injection Commonly known as: (VITAMIN B-12) INJECT 1 ML (1,000 MCG TOTAL) INTO THE MUSCLE EVERY 30 (THIRTY) DAYS.   DRY EYES OP Place 1 application into the right eye 4 (four) times daily.   gabapentin 100 MG capsule Commonly known as: NEURONTIN TAKE 1 CAPSULE BY MOUTH AT BEDTIME   HYDROcodone-acetaminophen 5-325 MG tablet Commonly known as: NORCO/VICODIN Take 1-2 tablets by mouth every 4 (four) hours as needed for up to 7 days for moderate pain ((score 7 to 10)).   latanoprost 0.005 % ophthalmic solution Commonly known as: XALATAN Place 1 drop into both eyes daily.   Northera 100 MG Caps Generic drug: Droxidopa TAKE ACCORDING TO 24  HOUR TITRATION SCHEDULE ON THE ENCLOSED DOSING GUIDE   polyethylene glycol 17 g packet Commonly known as: MIRALAX / GLYCOLAX Take 17 g by mouth as needed. What changed: when to take this   pramipexole 1 MG tablet Commonly known as: MIRAPEX TAKE 1 TABLET BY MOUTH THREE TIMES A DAY What changed: additional instructions   sertraline 100 MG tablet Commonly known as:  ZOLOFT TAKE 1 TABLET (100 MG TOTAL) BY MOUTH DAILY.   Syringe/Needle (Disp) 18G X 1" 1 ML Misc Use to draw B12 shot monthly   Syringe/Needle (Disp) 25G X 1" 1 ML Misc Use to administer B12 shot monthly   traMADol 50 MG tablet Commonly known as: ULTRAM TAKE 1 TABLET BY MOUTH THREE TIMES A DAY AS NEEDED   triamcinolone cream 0.1 % Commonly known as: KENALOG APPLY TO AFFECTED AREA TWICE A DAY   Vitamin D3 25 MCG (1000 UT) Caps Take 1 capsule (1,000 Units total) by mouth daily.        SignedAshok Pall 11/28/2019, 9:47 AM

## 2019-11-28 NOTE — TOC Transition Note (Addendum)
Transition of Care [facility]) - CM/SW Discharge Note   Patient Details  Name: [name] MRN: [id number] Date of Birth: [birth year]  [facility] CM/SW Contact:  Atilano Median, LCSW Phone Number: 11/28/2019, 11:22 AM   Clinical Narrative:    Discharged home. Referral for HHPT made to [facility], Tiffany checking with [facility] to see if they are able to accept. She will follow up with CSW.   Patient able to be discharged home in the mean time.   Patient states that she dropped her phone in water and will need to be reached via her son or sisters phone.   Son [name] [phone] Sister [name] [phone]   Final next level of care: Box     Patient Goals and CMS Choice Patient states their goals for this hospitalization and ongoing recovery are:: go home with my sister CMS Medicare.gov Compare Post Acute Care list provided to:: Patient Choice offered to / list presented to : Patient  Discharge Placement                       Discharge Plan and Services                          HH Arranged: PT [facility] Agency: [facility] [employer] Date [place]: 11/28/19 Time [place]: 1121 Representative spoke with at [place]: [place] Interventions     Readmission Risk Interventions No flowsheet data found.

## 2019-11-28 NOTE — Progress Notes (Signed)
Patient is discharged from room 3C11 at this time. Alert and in stable condition. IV site d/c'd and instructions read to patient with understanding verbalized. Left unit via wheelchair with all belongings at side. 

## 2019-11-28 NOTE — Evaluation (Signed)
Occupational Therapy Evaluation Patient Details Name: [name] MRN: [id number] DOB: [birth year] Today's Date: 11/28/2019    History of Present Illness 74 yo female s/p L4-L5 foraminotomy on 11/27/19. PMH includes parkinsons disease, takotsubo cardiomyopathy, RLE and RUE reflex sympathetic dystrophy, osteoporosis, lupus, fibromyalgia, CHF, anxiety, OA.   Clinical Impression   Patient is s/p L4-5 foraminotomy surgery resulting in functional limitations due to the deficits listed below (see OT problem list). Pt currently supervision with RW for basic transfer and requires AE for LB dressing/ bathing. Recommend bench due to hx of falls in bathroom and progressive Parkinson's disease. Pt reports being able to use 3n1 at sister s house but will need 3n1 for her personal home. Pt will have son [place] purchase needed items.  Patient will benefit from skilled OT acutely to increase independence and safety with ADLS to allow discharge home with family (A) .     Follow Up Recommendations  No OT follow up    Equipment Recommendations  Tub/shower bench;3 in 1 bedside commode    Recommendations for Other Services       Precautions / Restrictions Precautions Precautions: Fall;Back Precaution Booklet Issued: Yes (comment) Precaution Comments: provided hand out and educated on adls. pt able to recall precautions verbally Restrictions Weight Bearing Restrictions: No      Mobility Bed Mobility Overal bed mobility: Needs Assistance Bed Mobility: Rolling;Sidelying to Sit;Sit to Supine Rolling: Min assist Sidelying to sit: Min assist   Sit to supine: Min assist;+2 for safety/equipment   General bed mobility comments: min assist for log roll technique, especially trunk elevation off of bed. Pt with good recall of sequence  Transfers Overall transfer level: Needs assistance Equipment used: Rolling walker (2 wheeled) Transfers: Sit to/from Stand Sit to Stand: Supervision         General  transfer comment: pt pushing from bed and coming to static standing. pt noted to have movement of arms and mouth this session. pt states "my parkinson is really acting up today"    Balance Overall balance assessment: Needs assistance;History of Falls Sitting-balance support: No upper extremity supported;Feet supported Sitting balance-Leahy Scale: Good     Standing balance support: Bilateral upper extremity supported;During functional activity Standing balance-Leahy Scale: Fair Standing balance comment: reliance on RW and again educated no cane at this time. Pt asking about use of cane from therapist.                            ADL either performed or assessed with clinical judgement   ADL Overall ADL's : Needs assistance/impaired Eating/Feeding: Set up   Grooming: Set up   Upper Body Bathing: Set up   Lower Body Bathing: Moderate assistance   Upper Body Dressing : Set up   Lower Body Dressing: Moderate assistance Lower Body Dressing Details (indicate cue type and reason): plans to have son purchase AE from [place]. pt familiar with AE from watching sister after her back surgery Toilet Transfer: Supervision/safety;RW           Functional mobility during ADLs: Supervision/safety;Rolling walker General ADL Comments: pt will have (A) for family at d/c      Vision Baseline Vision/History: Wears glasses Wears Glasses: Reading only(readers from store not prescription)       Perception     Praxis      Pertinent Vitals/Pain Pain Assessment: 0-10 Pain Score: 8  Faces Pain Scale: Hurts little more Pain Location: back Pain Descriptors / Indicators:  Sore Pain Intervention(s): Repositioned;Monitored during session;Patient requesting pain meds-RN notified     Hand Dominance Right   Extremity/Trunk Assessment Upper Extremity Assessment Upper Extremity Assessment: Overall WFL for tasks assessed   Lower Extremity Assessment Lower Extremity Assessment: Defer to  PT evaluation   Cervical / Trunk Assessment Cervical / Trunk Assessment: Kyphotic   Communication Communication Communication: No difficulties   Cognition Arousal/Alertness: Awake/alert Behavior During Therapy: Flat affect Overall Cognitive Status: Within Functional Limits for tasks assessed                                     General Comments  dressing dry and intact. pt dressed for home during session. pt able to button tshirt dress    Exercises     Shoulder Instructions      Home Living Family/patient expects to be discharged to:: Private residence Living Arrangements: Other relatives(will be d/c to sister's house, below information about pt's sister's home. Pt typically lives with her children) Available Help at Discharge: Family;Available 24 hours/day Type of Home: House Home Access: Ramped entrance     Home Layout: One level     Bathroom Shower/Tub: Teacher, early years/pre: Standard     Home Equipment: Environmental consultant - 2 wheels;Walker - 4 wheels;Cane - single point;Wheelchair - manual;Grab bars - tub/shower   Additional Comments: will dc to sisters house and sister with previous back surgery so has AE and 3n1 in her home. Pt requesting DME for her personal home.      Prior Functioning/Environment Level of Independence: Needs assistance  Gait / Transfers Assistance Needed: pt used cane for ambulation PTA ADL's / Homemaking Assistance Needed: Pt states she was independent in dressing, bathing, toileting PTA, but states she had a fall 2 weeks ago "into the tub shower"   Comments: reports hx of falls in bathroom especially with tub transfer. Recommendation is a bench at this time        OT Problem List: Decreased activity tolerance      OT Treatment/Interventions:      OT Goals(Current goals can be found in the care plan section) Acute Rehab OT Goals Patient Stated Goal: stop the pain  OT Frequency:     Barriers to D/C:             Co-evaluation              AM-PAC OT "6 Clicks" Daily Activity     Outcome Measure Help from another person eating meals?: None Help from another person taking care of personal grooming?: None Help from another person toileting, which includes using toliet, bedpan, or urinal?: A Little Help from another person bathing (including washing, rinsing, drying)?: A Little Help from another person to put on and taking off regular upper body clothing?: None Help from another person to put on and taking off regular lower body clothing?: A Little 6 Click Score: 21   End of Session Equipment Utilized During Treatment: Rolling walker Nurse Communication: Mobility status;Precautions  Activity Tolerance: Patient tolerated treatment well Patient left: in chair;with call bell/phone within reach  OT Visit Diagnosis: Unsteadiness on feet (R26.81)                Time: [id number] OT Time Calculation (min): 26 min Charges:  OT General Charges $OT Visit: 1 Visit OT Evaluation $OT Eval Moderate Complexity: 1 Mod   Brynn, OTR/L  Acute Rehabilitation Services Pager:  [phone] Office: [phone] .   Jeri Modena 11/28/2019, 10:53 AM

## 2019-11-28 NOTE — Care Management Obs Status (Signed)
[place] NOTIFICATION   Patient Details  Name: [name] MRN: [id number] Date of Birth: 10/05/1946   Medicare Observation Status Notification Given:  Yes    Atilano Median, [place] 11/28/2019, 9:34 AM

## 2019-11-28 NOTE — Care Management CC44 (Signed)
Condition Code 44 Documentation Completed  Patient Details  Name: [name] MRN: [id number] Date of Birth: October 11, 1946   Condition Code 44 given:  Yes Patient signature on Condition Code 44 notice:  Yes Documentation of 2 MD's agreement:  Yes Code 44 added to claim:  Yes    Atilano Median, LCSW 11/28/2019, 9:31 AM

## 2019-12-02 ENCOUNTER — Encounter: Payer: Self-pay | Admitting: Family Medicine

## 2019-12-03 MED ORDER — CLONAZEPAM 0.5 MG PO TABS: 0.5000 mg | ORAL_TABLET | Freq: Two times a day (BID) | ORAL | 0 refills | Status: DC | PRN

## 2019-12-03 NOTE — Telephone Encounter (Signed)
Last filled on 10/20/2019 #60 with 1 refill. LOV 10/20/2019 for CPE Next appointment on 02/17/2020 follow up

## 2019-12-03 NOTE — Telephone Encounter (Signed)
ERx 

## 2019-12-09 ENCOUNTER — Encounter: Payer: Self-pay | Admitting: Family Medicine

## 2019-12-10 ENCOUNTER — Telehealth: Payer: Self-pay

## 2019-12-10 NOTE — Telephone Encounter (Signed)
Discussed Prolia benefits w/pt.  Pt would owe approximately $250.  Pt understands and agrees. PA approval#[id number], valid 12-09-19 to 12-08-20.

## 2019-12-10 NOTE — Telephone Encounter (Signed)
Pt due next Prolia injection 12-28-19. Pt s/p back surgery 2wks.  Has appt w/surgeon 12-15-19.  Pt to call surgeon or discuss@[facility]  if ok to do Prolia as scheduled.

## 2019-12-10 NOTE — Telephone Encounter (Signed)
Noted. Agree.

## 2019-12-11 ENCOUNTER — Other Ambulatory Visit: Payer: Self-pay | Admitting: Neurology

## 2019-12-16 ENCOUNTER — Telehealth: Payer: Self-pay | Admitting: Neurology

## 2019-12-16 NOTE — Telephone Encounter (Signed)
Patient called and left a message that was difficult to understand. She said she had surgery two weeks ago and her PD is bothering her a lot, the facial stuff. She'd like to speak with someone about this.

## 2019-12-16 NOTE — Telephone Encounter (Signed)
We generally don't change PD meds after surgery because the stress of surgery can make the symptoms worse but the symptoms will get better all on their own.  If she would like I can see her on the 1/28 virtual day and we can talk more.

## 2019-12-16 NOTE — Telephone Encounter (Signed)
Patient called and stated she had back surgery 2 weeks ago and that ever since then her PD is getting worse. She said that Dr Vertell Limber told her to call Dr [name] and see if she could "do something to calm it down" and if she can have her appointment moved up?

## 2019-12-16 NOTE — Telephone Encounter (Signed)
Per pt her surgeon has ok'd her to have Prolia.

## 2019-12-17 NOTE — Telephone Encounter (Signed)
Please call and move patients appointment for a VV to 1/28

## 2019-12-23 NOTE — Progress Notes (Signed)
Virtual Visit via Phone Note (attempted x 2 video platforms without success) The purpose of this virtual visit is to provide medical care while limiting exposure to the novel coronavirus.    Consent was obtained for telephone visit:  Yes.   Answered questions that patient had about telehealth interaction:  Yes.   I discussed the limitations, risks, security and privacy concerns of performing an evaluation and management service by telemedicine. I also discussed with the patient that there may be a patient responsible charge related to this service. The patient expressed understanding and agreed to proceed.  Pt location: Home Physician Location: home Name of referring provider:  [name], MD I connected with [name] at patients initiation/request on 12/25/2019 at  1:30 PM EST by telephone application and verified that I am speaking with the correct person using two identifiers. Pt MRN:  [id number] Pt DOB:  1946-10-23 Video Participants:  [name];     History of Present Illness:  Patient seen today in follow-up for Parkinson's disease.  Medical records reviewed since last visit, including my own as well as hospital records from her recent surgery.  Amantadine discontinued last visit because of hallucinations.  Her dyskinesia did get worse after we stopped it.  Because of that, I told her to change the way she took levodopa from 1.5 tablets 3 times per day to 1 tablet 4 times per day.  She is free of hallucinations.   Patient called recently because she had low back surgery at the end of December.  This seemed to make Parkinson's worse and she wanted to be worked in.  Surgery was done by Dr. Vertell Limber.    She had right lumbar L4-L5 foraminotomy on December 31.  Continues to have issues with herpes zoster in the R eye.  Reports that "shaking" is worse but when asked in detail, it is the dyskinesia that has gotten worse.  She is pain free in the legs.    Current movement d/o  meds:  Carbidopa/levodopa 25/100, 1 tablet at 7 AM/11 AM/3 PM/7 PM Carbidopa/levodopa 50/200 at bedtime Pramipexole 1.0 mg 3 times per day Northera, 600 mg 3 times per day (reports she is off of it - "they quit sending it because I owed them some money but my BP is good" Sertraline, 100 mg daily Current Outpatient Medications on File Prior to Visit  Medication Sig Dispense Refill  . alfuzosin (UROXATRAL) 10 MG 24 hr tablet Take 10 mg by mouth daily.     . Artificial Tear Ointment (DRY EYES OP) Place 1 application into the right eye 4 (four) times daily.     . Calcium Carbonate-Vitamin D (CALCIUM 600/VITAMIN D) 600-400 MG-UNIT chew tablet Chew 1 tablet by mouth daily.    . carbidopa-levodopa (SINEMET CR) 50-200 MG tablet TAKE 1 TABLET BY MOUTH EVERYDAY AT BEDTIME 90 tablet 1  . carbidopa-levodopa (SINEMET IR) 25-100 MG tablet Take 1 tablet by mouth 4 (four) times daily. (Patient taking differently: Take 1-1.5 tablets by mouth See admin instructions. Take 1.5 tablets by mouth at 0700, take 1 tablet by mouth at 1100, take 1.5 tablets by mouth at 1500, & take 1.5 tablets at 1900.) 360 tablet 0  . Cholecalciferol (VITAMIN D3) 25 MCG (1000 UT) CAPS Take 1 capsule (1,000 Units total) by mouth daily. (Patient taking differently: Take 1,000 Units by mouth daily. ) 30 capsule   . clonazePAM (KLONOPIN) 0.5 MG tablet Take 1 tablet (0.5 mg total) by mouth 2 (two) times  daily as needed. 60 tablet 0  . cyanocobalamin (,VITAMIN B-12,) 1000 MCG/ML injection INJECT 1 ML (1,000 MCG TOTAL) INTO THE MUSCLE EVERY 30 (THIRTY) DAYS. 3 mL 2  . gabapentin (NEURONTIN) 100 MG capsule TAKE 1 CAPSULE BY MOUTH AT BEDTIME (Patient taking differently: Take 100 mg by mouth at bedtime. ) 90 capsule 3  . latanoprost (XALATAN) 0.005 % ophthalmic solution Place 1 drop into both eyes daily.     . NORTHERA 100 MG CAPS TAKE ACCORDING TO 24 HOUR TITRATION SCHEDULE ON THE ENCLOSED DOSING GUIDE 495 capsule 0  . polyethylene glycol (MIRALAX  / GLYCOLAX) packet Take 17 g by mouth as needed. (Patient taking differently: Take 17 g by mouth daily. ) 30 each 1  . pramipexole (MIRAPEX) 1 MG tablet TAKE 1 TABLET BY MOUTH THREE TIMES A DAY (Patient taking differently: Take 1 mg by mouth 3 (three) times daily. 1/2 tablet in the AM, 1/2 at noon and 1 at 5pm) 270 tablet 1  . sertraline (ZOLOFT) 100 MG tablet TAKE 1 TABLET BY MOUTH EVERY DAY 90 tablet 1  . Syringe/Needle, Disp, 18G X 1" 1 ML MISC Use to draw B12 shot monthly 25 each 0  . Syringe/Needle, Disp, 25G X 1" 1 ML MISC Use to administer B12 shot monthly 25 each 0  . tiZANidine (ZANAFLEX) 4 MG tablet Take 1 tablet (4 mg total) by mouth every 6 (six) hours as needed for muscle spasms. 45 tablet 0  . traMADol (ULTRAM) 50 MG tablet TAKE 1 TABLET BY MOUTH THREE TIMES A DAY AS NEEDED 50 tablet 0  . triamcinolone cream (KENALOG) 0.1 % APPLY TO AFFECTED AREA TWICE A DAY 45 g 1   No current facility-administered medications on file prior to visit.     Observations/Objective:   Vitals:   12/24/19 1340  Weight: 158 lb (71.7 kg)  Height: 5' (1.524 m)   GEN:  The patient appears stated age and is in NAD.  Neurological examination:  Orientation: The patient is alert and oriented x3. Cranial nerves: There is good facial symmetry. There is mild facial hypomimia (seen quickly before video froze).  The speech is fluent and clear.   I have reviewed and interpreted the following labs independently   Chemistry      Component Value Date/Time   NA 141 11/24/2019 1135   K 3.8 11/24/2019 1135   CL 104 11/24/2019 1135   CO2 26 11/24/2019 1135   BUN 13 11/24/2019 1135   CREATININE 0.77 11/24/2019 1135   CREATININE 0.87 10/08/2019 0921      Component Value Date/Time   CALCIUM 9.5 11/24/2019 1135   ALKPHOS 62 10/04/2018 0856   AST 9 (L) 10/08/2019 0921   ALT <3 (L) 10/08/2019 0921   BILITOT 0.5 10/08/2019 0921     Lab Results  Component Value Date   WBC 4.4 11/24/2019   HGB 11.3 (L)  11/24/2019   HCT 35.9 (L) 11/24/2019   MCV 91.6 11/24/2019   PLT 248 11/24/2019     Assessment and Plan:   1. Idiopathic Parkinson's disease. The patient has tremor, bradykinesia, rigidity and postural instability. This was diagnosed today, 08/25/15, but based on records and pt reports I suspect that she has had this at least since 2013. -continue carbidopa/levodopa 25/100, 1 po qid  -Continue carbidopa/levodopa 50/200 at bedtime  -Increase pramipexole so that she is taking 1.0 mg, half a tablet in the morning, half a tablet in the afternoon and 1 tablet in the evening.  Doing this because of dyskinesia, but also because of her history of hallucinations.  These have resolved off of amantadine  -Off amantadine. 2. Dyskinesia -Now off of amantadine because of hallucinations.  -As above, now reducing pramipexole because of this 2. Orthostatic hypotension -she is on supposed to be on northera, 600 mg tid.  Reports today that she is off of that, primarily because she had the company money and they would not send her the medication.  She reports that her blood pressure has been okay.  In the past, when she went off of it her blood pressure dropped.  We will need to keep a close eye on that. 3. Constipation -she is using miralax. Recommend colace 4. Depression -She is on Zoloft. continue 100 mg daily. Encouraged her to restart counseling. 5. b12 deficiency -on injections. 6. Urinary incontinence -on myrbetriq. She is doing well in that regard 7. Lumbar radiculopathy -She is status post lumbar foraminotomy with Dr. Vertell Limber on November 27, 2019 9.  Vision change             -probably contributes to the confusion/hallucination issue as has been dealing with herpes ophthalmicus and sounds like losing eye sight permanently  Follow Up Instructions:  4-6 months  -I discussed the  assessment and treatment plan with the patient. The patient was provided an opportunity to ask questions and all were answered. The patient agreed with the plan and demonstrated an understanding of the instructions.   The patient was advised to call back or seek an in-person evaluation if the symptoms worsen or if the condition fails to improve as anticipated.    Total time spent on today's visit was 15 minutes,    Tara Guiles Cornesha Radziewicz, DO

## 2019-12-24 ENCOUNTER — Encounter: Payer: Self-pay | Admitting: Neurology

## 2019-12-25 ENCOUNTER — Telehealth (INDEPENDENT_AMBULATORY_CARE_PROVIDER_SITE_OTHER): Payer: Medicare HMO | Admitting: Neurology

## 2019-12-25 ENCOUNTER — Other Ambulatory Visit: Payer: Self-pay

## 2019-12-25 VITALS — Ht 60.0 in | Wt 158.0 lb

## 2019-12-25 DIAGNOSIS — G903 Multi-system degeneration of the autonomic nervous system: Secondary | ICD-10-CM

## 2019-12-25 DIAGNOSIS — G249 Dystonia, unspecified: Secondary | ICD-10-CM

## 2019-12-25 DIAGNOSIS — G20 Parkinson's disease: Secondary | ICD-10-CM | POA: Diagnosis not present

## 2019-12-26 ENCOUNTER — Telehealth: Payer: Medicare Other | Admitting: Neurology

## 2019-12-28 ENCOUNTER — Other Ambulatory Visit: Payer: Self-pay | Admitting: Family Medicine

## 2019-12-29 NOTE — Telephone Encounter (Signed)
Last refilled on 11/14/19 for #50 with 0 refills. Patient was last seen on 10/20/19 for CPE. Ok to refill?

## 2019-12-29 NOTE — Telephone Encounter (Signed)
ERx 

## 2020-01-01 ENCOUNTER — Other Ambulatory Visit: Payer: Self-pay

## 2020-01-01 ENCOUNTER — Ambulatory Visit (INDEPENDENT_AMBULATORY_CARE_PROVIDER_SITE_OTHER): Payer: Medicare HMO | Admitting: *Deleted

## 2020-01-01 DIAGNOSIS — M810 Age-related osteoporosis without current pathological fracture: Secondary | ICD-10-CM | POA: Diagnosis not present

## 2020-01-01 MED ORDER — DENOSUMAB 60 MG/ML ~~LOC~~ SOSY
60.0000 mg | PREFILLED_SYRINGE | Freq: Once | SUBCUTANEOUS | Status: AC
  Administered 2020-01-01: 09:00:00 60 mg via SUBCUTANEOUS

## 2020-01-01 NOTE — Progress Notes (Signed)
Per orders of Dr. Danise Mina injection of Prolia given by Lauralyn Primes. Patient tolerated injection well.

## 2020-01-03 ENCOUNTER — Other Ambulatory Visit: Payer: Self-pay | Admitting: Neurology

## 2020-01-06 DIAGNOSIS — H04123 Dry eye syndrome of bilateral lacrimal glands: Secondary | ICD-10-CM | POA: Diagnosis not present

## 2020-01-06 DIAGNOSIS — Z961 Presence of intraocular lens: Secondary | ICD-10-CM | POA: Diagnosis not present

## 2020-01-06 DIAGNOSIS — H02224 Mechanical lagophthalmos left upper eyelid: Secondary | ICD-10-CM | POA: Diagnosis not present

## 2020-01-06 DIAGNOSIS — H02221 Mechanical lagophthalmos right upper eyelid: Secondary | ICD-10-CM | POA: Diagnosis not present

## 2020-01-07 ENCOUNTER — Other Ambulatory Visit: Payer: Self-pay | Admitting: Neurology

## 2020-01-15 ENCOUNTER — Other Ambulatory Visit: Payer: Self-pay | Admitting: Neurology

## 2020-01-15 ENCOUNTER — Encounter: Payer: Self-pay | Admitting: Family Medicine

## 2020-01-15 DIAGNOSIS — G510 Bell's palsy: Secondary | ICD-10-CM | POA: Diagnosis not present

## 2020-01-15 DIAGNOSIS — H02834 Dermatochalasis of left upper eyelid: Secondary | ICD-10-CM | POA: Diagnosis not present

## 2020-01-15 DIAGNOSIS — Z8619 Personal history of other infectious and parasitic diseases: Secondary | ICD-10-CM | POA: Diagnosis not present

## 2020-01-15 DIAGNOSIS — H02831 Dermatochalasis of right upper eyelid: Secondary | ICD-10-CM | POA: Diagnosis not present

## 2020-01-15 DIAGNOSIS — H02232 Paralytic lagophthalmos right lower eyelid: Secondary | ICD-10-CM | POA: Diagnosis not present

## 2020-01-15 DIAGNOSIS — H16212 Exposure keratoconjunctivitis, left eye: Secondary | ICD-10-CM | POA: Diagnosis not present

## 2020-01-15 DIAGNOSIS — H57813 Brow ptosis, bilateral: Secondary | ICD-10-CM | POA: Diagnosis not present

## 2020-01-15 DIAGNOSIS — H04123 Dry eye syndrome of bilateral lacrimal glands: Secondary | ICD-10-CM | POA: Diagnosis not present

## 2020-01-15 DIAGNOSIS — H16211 Exposure keratoconjunctivitis, right eye: Secondary | ICD-10-CM | POA: Diagnosis not present

## 2020-01-15 DIAGNOSIS — H16213 Exposure keratoconjunctivitis, bilateral: Secondary | ICD-10-CM | POA: Diagnosis not present

## 2020-01-16 ENCOUNTER — Telehealth: Payer: Self-pay | Admitting: Neurology

## 2020-01-16 MED ORDER — GABAPENTIN 100 MG PO CAPS: 200.0000 mg | ORAL_CAPSULE | Freq: Every day | ORAL | 3 refills | Status: DC

## 2020-01-16 NOTE — Telephone Encounter (Signed)
Patient called and left a message regarding her carbidopa-levodopa 25-100 MG prescription. She said the pharmacy told her it was too soon for refills but she will be out this weekend.  She said she also wants to be sure she should continue taking her pramipexole at the current dosage and does she need to stay on it?  [facility] [address]

## 2020-01-16 NOTE — Telephone Encounter (Signed)
Patient has been notified directly; all questions, if any, were answered. Patient voiced understanding and thanks me for calling.

## 2020-01-16 NOTE — Telephone Encounter (Signed)
Gabapentin Last rx:  01/31/19, #90/6 Last OV:  10/20/19, AWV prt 2 Next OV:  02/17/20, 4 mo f/u

## 2020-01-16 NOTE — Telephone Encounter (Signed)
ERx 

## 2020-01-16 NOTE — Telephone Encounter (Signed)
I sent a new RX for the carbidopa/levodopa 25/100 today.  She will need to take it up with the pharmacy if she is refilling too early.  As for the pramipexole, yes, she should still be on it.  She should be on it as follows: 1.0 mg, half a tablet in the morning, half a tablet in the afternoon and 1 tablet in the evening.

## 2020-01-25 ENCOUNTER — Encounter: Payer: Self-pay | Admitting: Family Medicine

## 2020-02-03 ENCOUNTER — Telehealth: Payer: Self-pay

## 2020-02-03 ENCOUNTER — Other Ambulatory Visit: Payer: Self-pay | Admitting: Family Medicine

## 2020-02-03 MED ORDER — PRAMIPEXOLE DIHYDROCHLORIDE 1 MG PO TABS: ORAL_TABLET | ORAL | 1 refills | Status: DC

## 2020-02-03 NOTE — Telephone Encounter (Signed)
Name of Medication: Tramadol Name of Pharmacy: [facility] or Written Date and Quantity: 12/29/19, #50 Last Office Visit and Type: 10/20/19, AWV prt 2 Next Office Visit and Type: 02/17/20, 4 mo f/u Last Controlled Substance Agreement Date: 04/28/16 Last UDS: 04/28/16

## 2020-02-03 NOTE — Telephone Encounter (Signed)
Patient states she the medication, Mirapex, was changed and it should be noted in her mychart.   Reviewed patients chart and I see where the medication was changed to half tab in the morning, half in the afternoon and 1 tab at 5pm.   Medication list updated.  Contacted pharmacy and gave correct instructions. Pharmacist thanked me and voiced understanding.

## 2020-02-03 NOTE — Telephone Encounter (Signed)
See last office visit for correct instructions

## 2020-02-03 NOTE — Telephone Encounter (Signed)
Received call from [place] wanting to verify the correct instructions for Pramipexole.   The chart shows one tablet tid but there is also a note that says half tablet in the morning, afternoon and one tablet at noon.  Tried to contact the patient bit she did not answer. Will wait for a call back.   Will also speak with Dr [name] for clarification.

## 2020-02-05 NOTE — Telephone Encounter (Signed)
ERx 

## 2020-02-17 ENCOUNTER — Encounter: Payer: Self-pay | Admitting: Family Medicine

## 2020-02-17 ENCOUNTER — Other Ambulatory Visit: Payer: Self-pay

## 2020-02-17 ENCOUNTER — Ambulatory Visit (INDEPENDENT_AMBULATORY_CARE_PROVIDER_SITE_OTHER): Payer: Medicare HMO | Admitting: Family Medicine

## 2020-02-17 VITALS — BP 124/70 | HR 81 | Temp 97.7°F | Ht 66.5 in | Wt 163.3 lb

## 2020-02-17 DIAGNOSIS — M5416 Radiculopathy, lumbar region: Secondary | ICD-10-CM | POA: Diagnosis not present

## 2020-02-17 DIAGNOSIS — M5136 Other intervertebral disc degeneration, lumbar region: Secondary | ICD-10-CM | POA: Diagnosis not present

## 2020-02-17 DIAGNOSIS — I951 Orthostatic hypotension: Secondary | ICD-10-CM | POA: Diagnosis not present

## 2020-02-17 DIAGNOSIS — M48061 Spinal stenosis, lumbar region without neurogenic claudication: Secondary | ICD-10-CM | POA: Diagnosis not present

## 2020-02-17 DIAGNOSIS — B0052 Herpesviral keratitis: Secondary | ICD-10-CM | POA: Insufficient documentation

## 2020-02-17 DIAGNOSIS — G20 Parkinson's disease: Secondary | ICD-10-CM | POA: Diagnosis not present

## 2020-02-17 HISTORY — DX: Herpesviral keratitis: B00.52

## 2020-02-17 NOTE — Assessment & Plan Note (Signed)
Seeing eye specialist. Planned eye surgery.

## 2020-02-17 NOTE — Assessment & Plan Note (Signed)
Recent laminectomy and foraminotomy by Dr Vertell Limber with benefit. Appreciate NSG care. She had previously tried ESI.

## 2020-02-17 NOTE — Progress Notes (Signed)
This visit was conducted in person.  BP 124/70 (BP Location: Right Arm, Patient Position: Sitting, Cuff Size: Normal)   Pulse 81   Temp 97.7 F (36.5 C) (Temporal)   Ht 5' 6.5" (1.689 m)   Wt 163 lb 5 oz (74.1 kg)   SpO2 97%   BMI 25.96 kg/m    CC: 4 mo f/u visit  Subjective:    Patient ID: [name], female    DOB: [birth year], 74 y.o.   MRN: [id number]  HPI: [name] is a 74 y.o. female presenting on 02/17/2020 for Follow-up (Here for 4 mo f/u.), Skin Problem (C/o discoloration in left cheek. ), and Arm Pain (C/o left arm pain and spasms. )   She will get second pfizer covid shot on Thursday, L arm still sore from the first shot. She's also been having L arm spasms after COVID vaccine.   No recent falls.  Not currently using cane or walker.  She had back surgery 10/2019 - lumbar laminectomy (R L4/5 foraminotomy by Dr Vertell Limber). Recovered well. She is up to walking 20 min/day.   Had herpes infection in R eye (keratitis) with residual scarring. Sees Dr Tommy Rainwater. Upcoming appt with eye surgery.   Darker color skin on bilateral cheeks - asymptomatic.  Gave up soft drinks, only drinking water.      Relevant past medical, surgical, family and social history reviewed and updated as indicated. Interim medical history since our last visit reviewed. Allergies and medications reviewed and updated. Outpatient Medications Prior to Visit  Medication Sig Dispense Refill  . alfuzosin (UROXATRAL) 10 MG 24 hr tablet Take 10 mg by mouth daily.     . Artificial Tear Ointment (DRY EYES OP) Place 1 application into the right eye 4 (four) times daily.     . Calcium Carbonate-Vitamin D (CALCIUM 600/VITAMIN D) 600-400 MG-UNIT chew tablet Chew 1 tablet by mouth daily.    . carbidopa-levodopa (SINEMET CR) 50-200 MG tablet TAKE 1 TABLET BY MOUTH EVERYDAY AT BEDTIME 90 tablet 1  . carbidopa-levodopa (SINEMET IR) 25-100 MG tablet TAKE 1 TABLET BY MOUTH FOUR TIMES A DAY 360 tablet 1  .  Cholecalciferol (VITAMIN D3) 25 MCG (1000 UT) CAPS Take 1 capsule (1,000 Units total) by mouth daily. (Patient taking differently: Take 1,000 Units by mouth daily. ) 30 capsule   . clonazePAM (KLONOPIN) 0.5 MG tablet Take 1 tablet (0.5 mg total) by mouth 2 (two) times daily as needed. 60 tablet 0  . cyanocobalamin (,VITAMIN B-12,) 1000 MCG/ML injection INJECT 1 ML (1,000 MCG TOTAL) INTO THE MUSCLE EVERY 30 (THIRTY) DAYS. 3 mL 2  . gabapentin (NEURONTIN) 100 MG capsule Take 2 capsules (200 mg total) by mouth at bedtime. 180 capsule 3  . latanoprost (XALATAN) 0.005 % ophthalmic solution Place 1 drop into both eyes daily.     . polyethylene glycol (MIRALAX / GLYCOLAX) packet Take 17 g by mouth as needed. (Patient taking differently: Take 17 g by mouth daily. ) 30 each 1  . pramipexole (MIRAPEX) 1 MG tablet 1/2 tablet in the AM, 1/2 at noon and 1 at 5pm 180 tablet 1  . sertraline (ZOLOFT) 100 MG tablet TAKE 1 TABLET BY MOUTH EVERY DAY 90 tablet 1  . Syringe/Needle, Disp, 18G X 1" 1 ML MISC Use to draw B12 shot monthly 25 each 0  . Syringe/Needle, Disp, 25G X 1" 1 ML MISC Use to administer B12 shot monthly 25 each 0  . tiZANidine (ZANAFLEX) 4 MG tablet  Take 1 tablet (4 mg total) by mouth every 6 (six) hours as needed for muscle spasms. 45 tablet 0  . traMADol (ULTRAM) 50 MG tablet TAKE 1 TABLET BY MOUTH THREE TIMES A DAY AS NEEDED 50 tablet 0  . triamcinolone cream (KENALOG) 0.1 % APPLY TO AFFECTED AREA TWICE A DAY 45 g 1  . NORTHERA 100 MG CAPS TAKE ACCORDING TO 24 HOUR TITRATION SCHEDULE ON THE ENCLOSED DOSING GUIDE 495 capsule 0   No facility-administered medications prior to visit.     Per HPI unless specifically indicated in ROS section below Review of Systems Objective:    BP 124/70 (BP Location: Right Arm, Patient Position: Sitting, Cuff Size: Normal)   Pulse 81   Temp 97.7 F (36.5 C) (Temporal)   Ht 5' 6.5" (1.689 m)   Wt 163 lb 5 oz (74.1 kg)   SpO2 97%   BMI 25.96 kg/m   Wt  Readings from Last 3 Encounters:  02/17/20 163 lb 5 oz (74.1 kg)  12/24/19 158 lb (71.7 kg)  11/27/19 162 lb (73.5 kg)    Physical Exam Vitals and nursing note reviewed.  Constitutional:      Appearance: Normal appearance. She is not ill-appearing.  Eyes:     Extraocular Movements: Extraocular movements intact.     Pupils: Pupils are equal, round, and reactive to light.  Cardiovascular:     Rate and Rhythm: Normal rate and regular rhythm.     Pulses: Normal pulses.     Heart sounds: Normal heart sounds. No murmur.  Pulmonary:     Effort: Pulmonary effort is normal. No respiratory distress.     Breath sounds: Normal breath sounds. No wheezing, rhonchi or rales.  Skin:    Findings: Rash present.     Comments: Hyperpigmented macule L cheek with possible SK mid macule, no other rash appreciated  Neurological:     Mental Status: She is alert.     Motor: Tremor (resting) present.     Coordination: Coordination is intact.     Comments: Walks unassisted       Results for orders placed or performed during the hospital encounter of 11/24/19  Novel Coronavirus, NAA (Hosp order, Send-out to Ref Lab; [name] 18-24 hrs   Specimen: Nasopharyngeal Swab; Respiratory  Result Value Ref Range   SARS-CoV-2, NAA NOT DETECTED NOT DETECTED   Coronavirus Source NASOPHARYNGEAL    Assessment & Plan:  This visit occurred during the SARS-CoV-2 public health emergency.  Safety protocols were in place, including screening questions prior to the visit, additional usage of staff PPE, and extensive cleaning of exam room while observing appropriate contact time as indicated for disinfecting solutions.   Problem List Items Addressed This Visit    Right lumbar radiculopathy    Recent laminectomy and foraminotomy by Dr Vertell Limber with benefit. Appreciate NSG care. She had previously tried ESI.       Parkinson's disease ([place]) - Primary    Appreciate neurology care.  ?progression.       HYPOTENSION, ORTHOSTATIC     Stable period on northera.  She also finds increased water intake has helped.      Herpes simplex keratitis of right eye    Seeing eye specialist. Planned eye surgery.       Degenerative lumbar spinal stenosis   DDD (degenerative disc disease), lumbar    Recent laminectomy and foraminotomy by Dr Vertell Limber with benefit. Appreciate NSG care.           No orders  of the defined types were placed in this encounter.  No orders of the defined types were placed in this encounter.  Patient Instructions  Good to see you today. Continue current medicines.  We can watch darker skin on face.  I'm glad back surgery was helpful.    Follow up plan: Return in about 4 months (around 06/18/2020) for follow up visit.  [name], MD

## 2020-02-17 NOTE — Assessment & Plan Note (Addendum)
Stable period on northera.  She also finds increased water intake has helped.

## 2020-02-17 NOTE — Assessment & Plan Note (Signed)
Recent laminectomy and foraminotomy by Dr Vertell Limber with benefit. Appreciate NSG care.

## 2020-02-17 NOTE — Patient Instructions (Addendum)
Good to see you today. Continue current medicines.  We can watch darker skin on face.  I'm glad back surgery was helpful.

## 2020-02-17 NOTE — Assessment & Plan Note (Signed)
Appreciate neurology care.  ?progression.

## 2020-02-22 ENCOUNTER — Other Ambulatory Visit: Payer: Self-pay | Admitting: Family Medicine

## 2020-02-23 DIAGNOSIS — H16211 Exposure keratoconjunctivitis, right eye: Secondary | ICD-10-CM | POA: Diagnosis not present

## 2020-02-23 DIAGNOSIS — H16213 Exposure keratoconjunctivitis, bilateral: Secondary | ICD-10-CM | POA: Diagnosis not present

## 2020-02-23 DIAGNOSIS — H02232 Paralytic lagophthalmos right lower eyelid: Secondary | ICD-10-CM | POA: Diagnosis not present

## 2020-02-23 DIAGNOSIS — H02831 Dermatochalasis of right upper eyelid: Secondary | ICD-10-CM | POA: Diagnosis not present

## 2020-02-23 DIAGNOSIS — H57813 Brow ptosis, bilateral: Secondary | ICD-10-CM | POA: Diagnosis not present

## 2020-02-23 DIAGNOSIS — H02834 Dermatochalasis of left upper eyelid: Secondary | ICD-10-CM | POA: Diagnosis not present

## 2020-02-23 DIAGNOSIS — H04123 Dry eye syndrome of bilateral lacrimal glands: Secondary | ICD-10-CM | POA: Diagnosis not present

## 2020-02-23 DIAGNOSIS — H16212 Exposure keratoconjunctivitis, left eye: Secondary | ICD-10-CM | POA: Diagnosis not present

## 2020-02-23 DIAGNOSIS — Z8619 Personal history of other infectious and parasitic diseases: Secondary | ICD-10-CM | POA: Diagnosis not present

## 2020-02-23 NOTE — Telephone Encounter (Signed)
ERx 

## 2020-03-11 ENCOUNTER — Other Ambulatory Visit: Payer: Self-pay

## 2020-03-11 ENCOUNTER — Other Ambulatory Visit: Payer: Self-pay | Admitting: Family Medicine

## 2020-03-11 MED ORDER — GABAPENTIN 100 MG PO CAPS: 200.0000 mg | ORAL_CAPSULE | Freq: Every day | ORAL | 3 refills | Status: DC

## 2020-03-11 NOTE — Telephone Encounter (Signed)
Name of Medication: Tramadol Name of [facility]acy: [facility] or Written Date and Quantity: 02/05/20, #50 Last Office Visit and Type: 02/17/20, 4 mo f/u Next Office Visit and Type: 06/18/20, 4 mo f/u Last Controlled Substance Agreement Date: 04/28/16 Last UDS: 04/28/16

## 2020-03-11 NOTE — Telephone Encounter (Signed)
Spoke with pt about rx request from [facility].  Confirms she just changed insurance and has to use [facility].    E-scribed refill, per recent rx sent to local pharmacy in 12/2019.

## 2020-03-12 NOTE — Telephone Encounter (Signed)
ERx 

## 2020-03-29 DIAGNOSIS — H02834 Dermatochalasis of left upper eyelid: Secondary | ICD-10-CM | POA: Diagnosis not present

## 2020-03-29 DIAGNOSIS — H02231 Paralytic lagophthalmos right upper eyelid: Secondary | ICD-10-CM | POA: Diagnosis not present

## 2020-03-29 DIAGNOSIS — H02531 Eyelid retraction right upper eyelid: Secondary | ICD-10-CM | POA: Diagnosis not present

## 2020-03-31 ENCOUNTER — Other Ambulatory Visit: Payer: Self-pay | Admitting: Neurology

## 2020-03-31 ENCOUNTER — Other Ambulatory Visit: Payer: Self-pay

## 2020-03-31 MED ORDER — CARBIDOPA-LEVODOPA ER 50-200 MG PO TBCR: 1.0000 | EXTENDED_RELEASE_TABLET | Freq: Every day | ORAL | 2 refills | Status: DC

## 2020-03-31 NOTE — Telephone Encounter (Signed)
Refill request from [facility] for Clonazepam. [facility] but per telephone note on 03/11/20 stated patient has to use [facility] now.  Last time Clonazepam was filled on 02/23/20 #[facility]. LOV 02/17/20  Next appointment on 06/18/20.  Please review

## 2020-03-31 NOTE — Telephone Encounter (Signed)
Contacted patient to verify which carbidopa levodopa rx needed to be filled. (carbidopa levodopa 50/200mg )   Rx(s) sent to pharmacy electronically.

## 2020-03-31 NOTE — Telephone Encounter (Signed)
Patient called in stating she was trying to get her carbidopalevodopa through her mail in pharmacy, but they did not have the prescription. She sated their phone number is [phone]

## 2020-04-02 MED ORDER — CLONAZEPAM 0.5 MG PO TABS: 0.5000 mg | ORAL_TABLET | Freq: Two times a day (BID) | ORAL | 1 refills | Status: DC | PRN

## 2020-04-02 NOTE — Telephone Encounter (Signed)
ERx to local pharmacy

## 2020-04-07 ENCOUNTER — Other Ambulatory Visit: Payer: Self-pay

## 2020-04-07 NOTE — Telephone Encounter (Signed)
Duplicate request for carbidopa levodopa 50/200mg  from [facility]

## 2020-04-07 NOTE — Telephone Encounter (Signed)
Opened in error

## 2020-04-12 ENCOUNTER — Other Ambulatory Visit: Payer: Self-pay

## 2020-04-12 ENCOUNTER — Other Ambulatory Visit: Payer: Self-pay | Admitting: Family Medicine

## 2020-04-12 MED ORDER — CARBIDOPA-LEVODOPA 25-100 MG PO TABS: 1.0000 | ORAL_TABLET | Freq: Four times a day (QID) | ORAL | 1 refills | Status: DC

## 2020-04-12 NOTE — Telephone Encounter (Signed)
Rx(s) sent to pharmacy electronically.  

## 2020-04-13 ENCOUNTER — Telehealth: Payer: Self-pay | Admitting: Family Medicine

## 2020-04-13 NOTE — Telephone Encounter (Signed)
Patient called to schedule b12 injection She stated she was giving herself these at home but because of insurance she is going to have to start doing these in the office with [place].   Is this okay to schedule?   Patient stated she was getting these done monthly

## 2020-04-13 NOTE — Telephone Encounter (Signed)
Yes, ma'am.

## 2020-04-13 NOTE — Telephone Encounter (Signed)
Plz address in Dr. Synthia Innocent absence.   Name of Medication: Tramadol Name of Pharmacy: [facility] [place] or Written Date and Quantity: 03/12/20, #50 Last Office Visit and Type: 02/17/20, 4 mo f/u Next Office Visit and Type: 06/18/20, 4 mo f/u Last Controlled Substance Agreement Date: 04/28/16 Last UDS: 04/28/16

## 2020-04-20 ENCOUNTER — Other Ambulatory Visit: Payer: Self-pay

## 2020-04-20 ENCOUNTER — Ambulatory Visit (INDEPENDENT_AMBULATORY_CARE_PROVIDER_SITE_OTHER): Payer: Medicare Other

## 2020-04-20 DIAGNOSIS — E538 Deficiency of other specified B group vitamins: Secondary | ICD-10-CM | POA: Diagnosis not present

## 2020-04-20 MED ORDER — CYANOCOBALAMIN 1000 MCG/ML IJ SOLN
1000.0000 ug | Freq: Once | INTRAMUSCULAR | Status: AC
  Administered 2020-04-20: 1000 ug via INTRAMUSCULAR

## 2020-04-20 NOTE — Progress Notes (Signed)
Patient was given B12 injection in right deltoid, pr orders from Dr. Danise Mina. Patient tolerated well.

## 2020-04-22 NOTE — Progress Notes (Signed)
Assessment/Plan:   1.  Parkinsons Disease  -Continue carbidopa/levodopa 25/100, 1 tablet 4 times per day.  Can take an extra 1/2 tablet if needed on days going out  -Continue carbidopa/levodopa 50/200 at bedtime  -decrease pramipexole 0.5 mg tid.  (right now on 0.5/0.5/1mg ).  Doing this primarily because she has a significant amount of dyskinesia.  -Explained to her that her therapeutic window was quite narrow.  She goes between dyskinesia and off quite easily, which can be difficult to manage, especially given that she has hallucinations that has limited the use of some medications.  Fortunately, she is free of hallucinations right now.  -[facility]. 2. Dyskinesia -Now off of amantadine because of hallucinations.             -As above, now reducing pramipexole because of this 3.  Neurogenic Orthostatic Hypotension  -Did well on Northera.  Patient stopped that on her own.  We will need to watch that.  Denies lightheaded now 4.  Depression  -On sertraline  -Declines counseling 5. b12 deficiency -on injections. 6. Urinary incontinence -on myrbetriq. She is doing well in that regard 7. Lumbar radiculopathy -She is status post lumbar foraminotomy with Dr. Vertell Limber on November 27, 2019 9. 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  My previous records were reviewed prior to todays visit as well as outside records available to me. Pt denies falls.  Pt denies lightheadedness, near syncope.  No hallucinations.  Those have resolved with decrease of pramipexole.   Mood has been good.  Walking for exercise and she "slows down" after she ambulates for a while.  Some tremor.  Doing okay negotiating stairs at her house - bedroom upstairs.  She and son and  daughter all moved in together and she likes the living situation.  Had eye surgery about 6 weeks ago and "they don't close now when they aren't supposed to."  States that they did some type of lid lift.  Current prescribed movement disorder medications:  Carbidopa/levodopa 25/100, 1 tablet at 7 AM/11 AM/3 PM/7 PM Carbidopa/levodopa 50/200 at bedtime Pramipexole 1.0 mg, half tablet in the morning, half tablet in the afternoon and 1 tablet in the evening (decreased last visit because of hallucinations and dyskinesia) Sertraline, 100 mg daily  PREVIOUS MEDICATIONS: northera (worked well but pt d/c when they quit sending b/c she owed $); amantadine (discontinued because of hallucinations)  ALLERGIES:   Allergies  Allergen Reactions  . Amitriptyline Other (See Comments)    Sedated next morning  . Ciprofloxacin Nausea And Vomiting  . Cymbalta [Duloxetine Hcl] Other (See Comments)    Worsened depression  . Imipramine Hcl     REACTION: rash  . Iohexol      Code: HIVES, Desc: PT developed 2 hives, followed by SOB, severe headache post 87cc's Omnipaque 300., Onset Date: [id number]   . Lyrica [Pregabalin] Other (See Comments)    Tried during hospitalization - unsure effects but unable to tolerate  . Morphine Sulfate     REACTION: rash  . Sulfamethoxazole     REACTION: rash  . Iodine Rash  . Lidocaine Hcl Rash  . Neosporin [Neomycin-Bacitracin Zn-Polymyx] Rash    Worsened skin breaking out  . Tetracyclines & Related Rash    CURRENT MEDICATIONS:  Outpatient Encounter Medications as of 04/27/2020  Medication Sig  .  Artificial Tear Ointment (DRY EYES OP) Place 1 application into the right eye 4 (four) times daily.   . Calcium Carbonate-Vitamin D (CALCIUM 600/VITAMIN D) 600-400 MG-UNIT chew tablet Chew 1 tablet by mouth daily.  . carbidopa-levodopa (SINEMET CR) 50-200 MG tablet Take 1 tablet by mouth at bedtime.  . carbidopa-levodopa (SINEMET IR) 25-100 MG tablet Take 1 tablet by mouth 4  (four) times daily.  . Cholecalciferol (VITAMIN D3) 25 MCG (1000 UT) CAPS Take 1 capsule (1,000 Units total) by mouth daily. (Patient taking differently: Take 1,000 Units by mouth daily. )  . clonazePAM (KLONOPIN) 0.5 MG tablet Take 1 tablet (0.5 mg total) by mouth 2 (two) times daily as needed.  . cyanocobalamin (,VITAMIN B-12,) 1000 MCG/ML injection INJECT 1 ML (1,000 MCG TOTAL) INTO THE MUSCLE EVERY 30 (THIRTY) DAYS.  Marland Kitchen gabapentin (NEURONTIN) 100 MG capsule Take 2 capsules (200 mg total) by mouth at bedtime.  . polyethylene glycol (MIRALAX / GLYCOLAX) packet Take 17 g by mouth as needed. (Patient taking differently: Take 17 g by mouth daily. )  . pramipexole (MIRAPEX) 1 MG tablet 1/2 tablet in the AM, 1/2 at noon and 1 at 5pm  . sertraline (ZOLOFT) 100 MG tablet TAKE 1 TABLET BY MOUTH EVERY DAY  . tiZANidine (ZANAFLEX) 4 MG tablet Take 1 tablet (4 mg total) by mouth every 6 (six) hours as needed for muscle spasms.  . traMADol (ULTRAM) 50 MG tablet TAKE 1 TABLET BY MOUTH THREE TIMES A DAY AS NEEDED  . triamcinolone cream (KENALOG) 0.1 % APPLY TO AFFECTED AREA TWICE A DAY  . [DISCONTINUED] alfuzosin (UROXATRAL) 10 MG 24 hr tablet Take 10 mg by mouth daily.   . [DISCONTINUED] latanoprost (XALATAN) 0.005 % ophthalmic solution Place 1 drop into both eyes daily.   . [DISCONTINUED] Syringe/Needle, Disp, 18G X 1" 1 ML MISC Use to draw B12 shot monthly (Patient not taking: Reported on 04/27/2020)  . [DISCONTINUED] Syringe/Needle, Disp, 25G X 1" 1 ML MISC Use to administer B12 shot monthly (Patient not taking: Reported on 04/27/2020)   No facility-administered encounter medications on file as of 04/27/2020.    Objective:   PHYSICAL EXAMINATION:    VITALS:   Vitals:   04/27/20 1525  BP: 105/68  Pulse: 78  SpO2: 97%  Weight: 166 lb (75.3 kg)  Height: 5\' 5"  (1.651 m)    GEN:  The patient appears stated age and is in NAD. HEENT:  Normocephalic, atraumatic.  The mucous membranes are moist. The  superficial temporal arteries are without ropiness or tenderness. CV:  RRR Lungs:  CTAB Neck/HEME:  There are no carotid bruits bilaterally.  Neurological examination:  Orientation: The patient is alert and oriented x3. Cranial nerves: There is good facial symmetry with facial hypomimia. The speech is fluent and clear. Soft palate rises symmetrically and there is no tongue deviation. Hearing is intact to conversational tone. Sensation: Sensation is intact to light touch throughout Motor: Strength is at least antigravity x4.  Movement examination: Tone: There is normal tone in the UE/LE Abnormal movements: no tremor.  There is mod R sided dyskinesia.   Coordination:  There is mild decremation with RAM's, on the right Gait and Station: The patient has no difficulty arising out of a deep-seated chair without the use of the hands. The patient's stride length is good with exaggerated arm swing on the right due to dyskinesia .   I have reviewed and interpreted the following labs independently    Chemistry  Component Value Date/Time   NA 141 11/24/2019 1135   K 3.8 11/24/2019 1135   CL 104 11/24/2019 1135   CO2 26 11/24/2019 1135   BUN 13 11/24/2019 1135   CREATININE 0.77 11/24/2019 1135   CREATININE 0.87 10/08/2019 0921      Component Value Date/Time   CALCIUM 9.5 11/24/2019 1135   ALKPHOS 62 10/04/2018 0856   AST 9 (L) 10/08/2019 0921   ALT <3 (L) 10/08/2019 0921   BILITOT 0.5 10/08/2019 0921       Lab Results  Component Value Date   WBC 4.4 11/24/2019   HGB 11.3 (L) 11/24/2019   HCT 35.9 (L) 11/24/2019   MCV 91.6 11/24/2019   PLT 248 11/24/2019    Lab Results  Component Value Date   TSH 0.49 10/04/2018     Total time spent on today's visit was 40 minutes, including both face-to-face time and nonface-to-face time.  Time included that spent on review of records (prior notes available to me/labs/imaging if pertinent), discussing treatment and goals, answering  patient's questions and coordinating care.  Cc:  [name], MD

## 2020-04-27 ENCOUNTER — Other Ambulatory Visit: Payer: Self-pay

## 2020-04-27 ENCOUNTER — Encounter: Payer: Self-pay | Admitting: Neurology

## 2020-04-27 ENCOUNTER — Ambulatory Visit: Payer: Medicare Other | Admitting: Neurology

## 2020-04-27 VITALS — BP 105/68 | HR 78 | Ht 65.0 in | Wt 166.0 lb

## 2020-04-27 DIAGNOSIS — G20 Parkinson's disease: Secondary | ICD-10-CM

## 2020-04-27 DIAGNOSIS — G249 Dystonia, unspecified: Secondary | ICD-10-CM

## 2020-04-27 MED ORDER — PRAMIPEXOLE DIHYDROCHLORIDE 0.5 MG PO TABS: 0.5000 mg | ORAL_TABLET | Freq: Three times a day (TID) | ORAL | 1 refills | Status: DC

## 2020-04-27 NOTE — Patient Instructions (Addendum)
1.  Continue carbidopa/levodopa 25/100, 1 tablet 4 times per day (7am/11am/3pm/7pm).  Can take an extra 1/2 tablet if needed on days going out. 2.  Decrease pramipexole 0.5 mg, 1 tablet three times per day (if you want to use your current RX until it runs out, take 1/2 tablet three times per day) 3.  We will restart Physical therapy ([facility]) Address: [address], [place], [place] [address] Phone: [phone]  The physicians and staff at [facility] are committed to providing excellent care. You may receive a survey requesting feedback about your experience at our office. We strive to receive "very good" responses to the survey questions. If you feel that your experience would prevent you from giving the office a "very good " response, please contact our office to try to remedy the situation. We may be reached at [phone]. Thank you for taking the time out of your busy day to complete the survey.

## 2020-04-28 ENCOUNTER — Telehealth: Payer: Self-pay

## 2020-04-28 NOTE — Telephone Encounter (Signed)
Referral  Type of referral:Physical therapy  Provider Name/ Location: [facility]  [id number]  [phone]  Address:  [address], [place] [address]  Appointment Date and Time: April 30, 2020

## 2020-04-30 ENCOUNTER — Telehealth: Payer: Self-pay | Admitting: Neurology

## 2020-04-30 DIAGNOSIS — R2681 Unsteadiness on feet: Secondary | ICD-10-CM | POA: Diagnosis not present

## 2020-04-30 DIAGNOSIS — G20 Parkinson's disease: Secondary | ICD-10-CM | POA: Diagnosis not present

## 2020-04-30 NOTE — Telephone Encounter (Signed)
[name] from [facility] called requesting the date the patient was initially diagnosed with Parkinson's Disease.

## 2020-04-30 NOTE — Telephone Encounter (Signed)
Left message of date.

## 2020-04-30 NOTE — Telephone Encounter (Signed)
07-2015 

## 2020-05-04 DIAGNOSIS — R2681 Unsteadiness on feet: Secondary | ICD-10-CM | POA: Diagnosis not present

## 2020-05-04 DIAGNOSIS — G20 Parkinson's disease: Secondary | ICD-10-CM | POA: Diagnosis not present

## 2020-05-07 ENCOUNTER — Telehealth: Payer: Self-pay | Admitting: Neurology

## 2020-05-07 DIAGNOSIS — G20 Parkinson's disease: Secondary | ICD-10-CM | POA: Diagnosis not present

## 2020-05-07 DIAGNOSIS — R2681 Unsteadiness on feet: Secondary | ICD-10-CM | POA: Diagnosis not present

## 2020-05-07 NOTE — Telephone Encounter (Signed)
Pharmacy will be updated at next refill request.

## 2020-05-07 NOTE — Telephone Encounter (Signed)
Left message with the after hour service on 05-07-20 @ 12:15 pm   Caller states she wants [place] to know she has a new pharmacy. She now uses the [facility] at [address] in [place]

## 2020-05-11 ENCOUNTER — Telehealth: Payer: Self-pay | Admitting: Family Medicine

## 2020-05-11 MED ORDER — CLONAZEPAM 0.5 MG PO TABS: 0.5000 mg | ORAL_TABLET | Freq: Two times a day (BID) | ORAL | 1 refills | Status: DC | PRN

## 2020-05-11 NOTE — Telephone Encounter (Signed)
Patient needs a refill on her Clonazepam.  Patient switched pharmacies. Patient now uses [facility] in [place]. Patient has 2 pills left.

## 2020-05-11 NOTE — Telephone Encounter (Signed)
ERx 

## 2020-05-17 ENCOUNTER — Telehealth: Payer: Self-pay | Admitting: Family Medicine

## 2020-05-17 NOTE — Telephone Encounter (Signed)
Patient called requesting a refill   sertraline (ZOLOFT) 100 MG tablet  Patient stated she has a few tablets left     Patient would like this sent to [facility]

## 2020-05-18 MED ORDER — SERTRALINE HCL 100 MG PO TABS: 100.0000 mg | ORAL_TABLET | Freq: Every day | ORAL | 1 refills | Status: DC

## 2020-05-18 NOTE — Addendum Note (Signed)
Addended by: Ludwig Clarks on: 05/18/2020 07:18 AM   Modules accepted: Orders

## 2020-05-26 ENCOUNTER — Other Ambulatory Visit: Payer: Self-pay

## 2020-05-26 ENCOUNTER — Ambulatory Visit (INDEPENDENT_AMBULATORY_CARE_PROVIDER_SITE_OTHER): Payer: 59

## 2020-05-26 DIAGNOSIS — E538 Deficiency of other specified B group vitamins: Secondary | ICD-10-CM | POA: Diagnosis not present

## 2020-05-26 MED ORDER — CYANOCOBALAMIN 1000 MCG/ML IJ SOLN
1000.0000 ug | Freq: Once | INTRAMUSCULAR | Status: AC
  Administered 2020-05-26: 1000 ug via INTRAMUSCULAR

## 2020-05-26 NOTE — Progress Notes (Signed)
Per orders of Dr. Gutierrez, injection of B12 given by Birdia Jaycox. Patient tolerated injection well.  

## 2020-05-28 ENCOUNTER — Telehealth (INDEPENDENT_AMBULATORY_CARE_PROVIDER_SITE_OTHER): Payer: 59

## 2020-05-28 DIAGNOSIS — M810 Age-related osteoporosis without current pathological fracture: Secondary | ICD-10-CM

## 2020-05-28 NOTE — Telephone Encounter (Signed)
Last Prolia injection 01-01-20.  Pt has [employer] MCR, no PA.  Pt owes approximately $240, (20% for Prolia/no admin fee).

## 2020-06-10 ENCOUNTER — Telehealth: Payer: Self-pay | Admitting: Neurology

## 2020-06-10 NOTE — Telephone Encounter (Signed)
Pt c/o increasing tremor Current tremor meds AND times they are taken - carbidopa-levodopa 25/100 @ 6AM/10AM/2PM/6PM -50/200 @ 9PM, rarely takes extra 1/2 tablet  Pts next appointment: 10/01/2020  Pt states her right arm tremor is worse to the point she can hardly do her hair or put on makeup etc, some incr in left arm as well. No hallucinations or MS changes. Somewhat more difficult to walk. Hasn't gone to PT class in a couple weeks d/t family emergencies but going to try and get back this week or next. She is also questioning DBS because her family has read about it and wondered if an option. Told her I would get message to Dr [name] and call her back with update.

## 2020-06-10 NOTE — Telephone Encounter (Signed)
We can discuss the option of dbs in person (not appropriate for phone - too much information).  Don't want to increase med for tremor alone.  It will just give her side effect and not do anything helpful likely.  Remind her that she can take that extra 1/2 tablet if needed on bad days

## 2020-06-10 NOTE — Telephone Encounter (Signed)
Spoke with pt and gave information from Dr [name] that she does not want to increase her medication for tremor alone d/t potential side effects but that she is encouraged to take the extra 1/2 tablet on bad days. Dr [name] is also happy to discuss DBS at her next office visit, there is too much information to discuss over the phone, I encouraged her to have a family member present for that discussion. Pt verbalized understanding of all information.

## 2020-06-10 NOTE — Telephone Encounter (Signed)
Patient called in wanting some advice on some symptoms. Lately her right arm has been shaking pretty bad. She cannot get it to stop shaking. It's interfering with her being able to complete tasks.

## 2020-06-18 ENCOUNTER — Encounter: Payer: Self-pay | Admitting: Family Medicine

## 2020-06-18 ENCOUNTER — Ambulatory Visit (INDEPENDENT_AMBULATORY_CARE_PROVIDER_SITE_OTHER): Payer: Medicare Other | Admitting: Family Medicine

## 2020-06-18 ENCOUNTER — Other Ambulatory Visit: Payer: Self-pay

## 2020-06-18 VITALS — BP 124/72 | HR 78 | Temp 97.7°F | Ht 65.0 in | Wt 162.4 lb

## 2020-06-18 DIAGNOSIS — D649 Anemia, unspecified: Secondary | ICD-10-CM

## 2020-06-18 DIAGNOSIS — M25512 Pain in left shoulder: Secondary | ICD-10-CM

## 2020-06-18 DIAGNOSIS — M810 Age-related osteoporosis without current pathological fracture: Secondary | ICD-10-CM | POA: Diagnosis not present

## 2020-06-18 DIAGNOSIS — B0052 Herpesviral keratitis: Secondary | ICD-10-CM | POA: Diagnosis not present

## 2020-06-18 DIAGNOSIS — M797 Fibromyalgia: Secondary | ICD-10-CM

## 2020-06-18 DIAGNOSIS — R252 Cramp and spasm: Secondary | ICD-10-CM | POA: Diagnosis not present

## 2020-06-18 DIAGNOSIS — G20 Parkinson's disease: Secondary | ICD-10-CM

## 2020-06-18 LAB — CBC WITH DIFFERENTIAL/PLATELET
Basophils Absolute: 0 10*3/uL (ref 0.0–0.1)
Basophils Relative: 0.6 % (ref 0.0–3.0)
Eosinophils Absolute: 0.2 10*3/uL (ref 0.0–0.7)
Eosinophils Relative: 3.6 % (ref 0.0–5.0)
HCT: 35.2 % — ABNORMAL LOW (ref 36.0–46.0)
Hemoglobin: 11.4 g/dL — ABNORMAL LOW (ref 12.0–15.0)
Lymphocytes Relative: 38 % (ref 12.0–46.0)
Lymphs Abs: 2 10*3/uL (ref 0.7–4.0)
MCHC: 32.5 g/dL (ref 30.0–36.0)
MCV: 86.8 fl (ref 78.0–100.0)
Monocytes Absolute: 0.4 10*3/uL (ref 0.1–1.0)
Monocytes Relative: 7.9 % (ref 3.0–12.0)
Neutro Abs: 2.6 10*3/uL (ref 1.4–7.7)
Neutrophils Relative %: 49.9 % (ref 43.0–77.0)
Platelets: 276 10*3/uL (ref 150.0–400.0)
RBC: 4.05 Mil/uL (ref 3.87–5.11)
RDW: 15.8 % — ABNORMAL HIGH (ref 11.5–15.5)
WBC: 5.2 10*3/uL (ref 4.0–10.5)

## 2020-06-18 LAB — BASIC METABOLIC PANEL
BUN: 16 mg/dL (ref 6–23)
CO2: 30 mEq/L (ref 19–32)
Calcium: 10.1 mg/dL (ref 8.4–10.5)
Chloride: 103 mEq/L (ref 96–112)
Creatinine, Ser: 0.78 mg/dL (ref 0.40–1.20)
GFR: 72.6 mL/min (ref >60.00)
Glucose, Bld: 109 mg/dL — ABNORMAL HIGH (ref 70–99)
Potassium: 4.2 mEq/L (ref 3.5–5.1)
Sodium: 140 mEq/L (ref 135–145)

## 2020-06-18 LAB — CK: Total CK: 128 U/L (ref 7–177)

## 2020-06-18 LAB — MAGNESIUM: Magnesium: 1.7 mg/dL (ref 1.5–2.5)

## 2020-06-18 LAB — FOLATE: Folate: >24.8 ng/mL (ref >5.9)

## 2020-06-18 NOTE — Addendum Note (Signed)
Addended by: Randall An on: 06/18/2020 09:13 AM   Modules accepted: Orders

## 2020-06-18 NOTE — Progress Notes (Signed)
This visit was conducted in person.  BP 124/72 (BP Location: Left Arm, Patient Position: Sitting, Cuff Size: Normal)   Pulse 78   Temp 97.7 F (36.5 C) (Temporal)   Ht 5\' 5"  (1.651 m)   Wt 162 lb 7 oz (73.7 kg)   SpO2 97%   BMI 27.03 kg/m    CC: 4 mo f/u visit  Subjective:    Patient ID: [name], female    DOB: [birth year], 74 y.o.   MRN: [id number]  HPI: [name] is a 73 y.o. female presenting on 06/18/2020 for Follow-up (Here for 4 mo f/u.), Arm Pain (C/o left arm pain worsening.  Seen for previously.), and Toe Problem (C/o toes on left foot spreading at night. )   Ongoing L arm pain/spasms thinks started since [place]. Worse at night. L>R shoulders. Managing with tylenol 650mg  as well as tramadol - tylenol seems to help the best. Ice helps more than heat. Aleve has helped. Has not been able to complete PT due to family issues. She is walking regularly in the morning.   Notes some toe pain to bilateral feet - toes can cramp at night time.   Upcoming prolia shot and B12 shot 07/06/2020 - will return for nurse visit for this. Due for labs today.   Upcoming vacation to [facility] with her children.   Parkinson's disease - followed by Dr [name]. Narrow therapeutic window for medications.   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 Sees Dr Earnest Conroy in [place]. Upcoming appt with eye surgery.      Relevant past medical, surgical, family and social history reviewed and updated as indicated. Interim medical history since our last visit reviewed. Allergies and medications reviewed and updated. Outpatient Medications Prior to Visit  Medication Sig Dispense Refill  . Artificial Tear Ointment (DRY EYES OP) Place 1 application into the right eye 4 (four) times daily.     . Calcium Carbonate-Vitamin D (CALCIUM 600/VITAMIN D) 600-400 MG-UNIT chew tablet Chew 1 tablet by mouth daily.    . carbidopa-levodopa (SINEMET CR) 50-200 MG tablet Take 1 tablet by  mouth at bedtime. 90 tablet 2  . carbidopa-levodopa (SINEMET IR) 25-100 MG tablet Take 1 tablet by mouth 4 (four) times daily. 360 tablet 1  . Cholecalciferol (VITAMIN D3) 25 MCG (1000 UT) CAPS Take 1 capsule (1,000 Units total) by mouth daily. (Patient taking differently: Take 1,000 Units by mouth daily. ) 30 capsule   . clonazePAM (KLONOPIN) 0.5 MG tablet Take 1 tablet (0.5 mg total) by mouth 2 (two) times daily as needed. 60 tablet 1  . cyanocobalamin (,VITAMIN B-12,) 1000 MCG/ML injection INJECT 1 ML (1,000 MCG TOTAL) INTO THE MUSCLE EVERY 30 (THIRTY) DAYS. 3 mL 2  . gabapentin (NEURONTIN) 100 MG capsule Take 2 capsules (200 mg total) by mouth at bedtime. 180 capsule 3  . polyethylene glycol (MIRALAX / GLYCOLAX) packet Take 17 g by mouth as needed. (Patient taking differently: Take 17 g by mouth daily. ) 30 each 1  . pramipexole (MIRAPEX) 0.5 MG tablet Take 1 tablet (0.5 mg total) by mouth 3 (three) times daily. 270 tablet 1  . sertraline (ZOLOFT) 100 MG tablet Take 1 tablet (100 mg total) by mouth daily. 90 tablet 1  . tiZANidine (ZANAFLEX) 4 MG tablet Take 1 tablet (4 mg total) by mouth every 6 (six) hours as needed for muscle spasms. 45 tablet 0  . traMADol (ULTRAM) 50 MG tablet TAKE 1 TABLET  BY MOUTH THREE TIMES A DAY AS NEEDED 50 tablet 0  . triamcinolone cream (KENALOG) 0.1 % APPLY TO AFFECTED AREA TWICE A DAY 45 g 1   No facility-administered medications prior to visit.     Per HPI unless specifically indicated in ROS section below Review of Systems Objective:  BP 124/72 (BP Location: Left Arm, Patient Position: Sitting, Cuff Size: Normal)   Pulse 78   Temp 97.7 F (36.5 C) (Temporal)   Ht 5\' 5"  (1.651 m)   Wt 162 lb 7 oz (73.7 kg)   SpO2 97%   BMI 27.03 kg/m   Wt Readings from Last 3 Encounters:  06/18/20 162 lb 7 oz (73.7 kg)  04/27/20 166 lb (75.3 kg)  02/17/20 163 lb 5 oz (74.1 kg)      Physical Exam Vitals and nursing note reviewed.  Constitutional:       Appearance: Normal appearance. She is not ill-appearing.  Cardiovascular:     Rate and Rhythm: Normal rate and regular rhythm.     Pulses: Normal pulses.     Heart sounds: Normal heart sounds. No murmur heard.   Pulmonary:     Effort: Pulmonary effort is normal. No respiratory distress.     Breath sounds: Normal breath sounds. No wheezing, rhonchi or rales.  Musculoskeletal:        General: Tenderness present. No deformity.     Comments:  R shoulder discomfort to palpation L shoulder exam:  No deformity of shoulders on inspection. Diffuse discomfort palpation of shoulder landmarks. LROM due to pain Discomfort with testing SITS in ext/int rotation. + pain with empty can sign. + Speed test. Pain with impingement testing. Discomfort with rotation of humeral head in [facility] joint.   Skin:    General: Skin is warm and dry.     Findings: No rash.  Neurological:     Mental Status: She is alert.     Motor: Tremor (resting) present.  Psychiatric:        Mood and Affect: Mood normal.        Behavior: Behavior normal.       Results for orders placed or performed in visit on 06/18/20  Magnesium  Result Value Ref Range   Magnesium 1.7 1.5 - 2.5 mg/dL  CK  Result Value Ref Range   Total CK 128 7.0 - 177.0 U/L  CBC with Differential/Platelet  Result Value Ref Range   WBC 5.2 4.0 - 10.5 K/uL   RBC 4.05 3.87 - 5.11 Mil/uL   Hemoglobin 11.4 (L) 12.0 - 15.0 g/dL   HCT 35.2 (L) 36 - 46 %   MCV 86.8 78.0 - 100.0 fl   MCHC 32.5 30.0 - 36.0 g/dL   RDW 15.8 (H) 11.5 - 15.5 %   Platelets 276.0 150 - 400 K/uL   Neutrophils Relative % 49.9 43 - 77 %   Lymphocytes Relative 38.0 12 - 46 %   Monocytes Relative 7.9 3 - 12 %   Eosinophils Relative 3.6 0 - 5 %   Basophils Relative 0.6 0 - 3 %   Neutro Abs 2.6 1.4 - 7.7 K/uL   Lymphs Abs 2.0 0.7 - 4.0 K/uL   Monocytes Absolute 0.4 0 - 1 K/uL   Eosinophils Absolute 0.2 0 - 0 K/uL   Basophils Absolute 0.0 0 - 0 K/uL  Folate  Result Value Ref  Range   Folate >24.8 >5.9 ng/mL   Assessment & Plan:  This visit occurred during the SARS-CoV-2 public health  emergency.  Safety protocols were in place, including screening questions prior to the visit, additional usage of staff PPE, and extensive cleaning of exam room while observing appropriate contact time as indicated for disinfecting solutions.   Problem List Items Addressed This Visit    Parkinson's disease ([place]) - Primary    Tremor seems to be progressing, trouble tolerating dopaminergic meds, discussing DBS with neuro. Appreciate Dr Doristine Devoid care.       Relevant Orders   Magnesium (Completed)   CK (Completed)   Osteoporosis    BMP and vit d check today - will return next month for prolia injection.       Left shoulder pain    Diffusely tender on exam with multiple positive signs, anticipate allodynia/hyperalgesia from FM playing a part - as well as possible RTC tendonitis vs arthritis vs other. rec voltaren gel to shoulder, continue tylenol with tramadol. Will also provide with exercises from [facility] pt advisor on RTC injury. Discussed sports med eval if not improving with above.       Herpes simplex keratitis of right eye    Continues seeing eye specialist.       Fibromyalgia   Anemia    Update CBC and folate.       Relevant Orders   CBC with Differential/Platelet (Completed)   Folate (Completed)    Other Visit Diagnoses    Foot cramps       Relevant Orders   Magnesium (Completed)   CK (Completed)   CBC with Differential/Platelet (Completed)       No orders of the defined types were placed in this encounter.  Orders Placed This Encounter  Procedures  . Magnesium  . CK  . CBC with Differential/Platelet  . Folate    Patient Instructions  Labs today (prolia) I think you have arthritis and possible rotator cuff injury to L>R shoulders.  Try voltaren gel topically to shoulders  Do exercises provided today.  Let us know if not improving with this to consider  sports medicine evaluation.    Follow up plan: Return in about 4 months (around 10/19/2020), or if symptoms worsen or fail to improve, for medicare wellness visit, annual exam, prior fasting for blood work.  [name], MD

## 2020-06-18 NOTE — Patient Instructions (Addendum)
Labs today (prolia) I think you have arthritis and possible rotator cuff injury to L>R shoulders.  Try voltaren gel topically to shoulders  Do exercises provided today.  Let us know if not improving with this to consider sports medicine evaluation.

## 2020-06-18 NOTE — Telephone Encounter (Signed)
Pt has apt today with PCP and will have BMP lab today while having PCP apt.  Placed order and scheduled pt for [place] for prolia inj.

## 2020-06-19 DIAGNOSIS — M25512 Pain in left shoulder: Secondary | ICD-10-CM | POA: Insufficient documentation

## 2020-06-19 NOTE — Assessment & Plan Note (Addendum)
BMP and vit d check today - will return next month for prolia injection.

## 2020-06-19 NOTE — Assessment & Plan Note (Addendum)
Update CBC and folate.

## 2020-06-19 NOTE — Assessment & Plan Note (Addendum)
Diffusely tender on exam with multiple positive signs, anticipate allodynia/hyperalgesia from FM playing a part - as well as possible RTC tendonitis vs arthritis vs other. rec voltaren gel to shoulder, continue tylenol with tramadol. Will also provide with exercises from [facility] pt advisor on RTC injury. Discussed sports med eval if not improving with above.

## 2020-06-19 NOTE — Assessment & Plan Note (Signed)
Continues seeing eye specialist.

## 2020-06-19 NOTE — Assessment & Plan Note (Addendum)
Tremor seems to be progressing, trouble tolerating dopaminergic meds, discussing DBS with neuro. Appreciate Dr Doristine Devoid care.

## 2020-06-21 ENCOUNTER — Encounter: Payer: Self-pay | Admitting: Family Medicine

## 2020-06-22 NOTE — Telephone Encounter (Signed)
Last office visit 06/18/2020 for follow up arm pain/toe problem.  Last refilled 04/13/2020 for #50 with no refills.  CPE scheduled for 10/27/2020.

## 2020-06-23 MED ORDER — TRAMADOL HCL 50 MG PO TABS: 50.0000 mg | ORAL_TABLET | Freq: Three times a day (TID) | ORAL | 0 refills | Status: DC | PRN

## 2020-06-23 NOTE — Telephone Encounter (Signed)
ERx 

## 2020-06-25 NOTE — Telephone Encounter (Signed)
Ca normal and CrCl is normal at 76.17mL/min. [place] for inj on 8/10

## 2020-07-06 ENCOUNTER — Other Ambulatory Visit: Payer: Self-pay

## 2020-07-06 ENCOUNTER — Ambulatory Visit (INDEPENDENT_AMBULATORY_CARE_PROVIDER_SITE_OTHER): Payer: Medicare Other | Admitting: *Deleted

## 2020-07-06 DIAGNOSIS — E538 Deficiency of other specified B group vitamins: Secondary | ICD-10-CM

## 2020-07-06 DIAGNOSIS — M810 Age-related osteoporosis without current pathological fracture: Secondary | ICD-10-CM | POA: Diagnosis not present

## 2020-07-06 MED ORDER — DENOSUMAB 60 MG/ML ~~LOC~~ SOSY
60.0000 mg | PREFILLED_SYRINGE | Freq: Once | SUBCUTANEOUS | Status: AC
  Administered 2020-07-06: 60 mg via SUBCUTANEOUS

## 2020-07-06 MED ORDER — CYANOCOBALAMIN 1000 MCG/ML IJ SOLN
1000.0000 ug | Freq: Once | INTRAMUSCULAR | Status: AC
  Administered 2020-07-06: 1000 ug via INTRAMUSCULAR

## 2020-07-06 NOTE — Progress Notes (Signed)
Per orders of Dr.Gutierrez, injection of B12 1000 mcg/mL and denosumab (PROLIA) injection 60 mg given by Kailiana Granquist. Patient tolerated injection well.

## 2020-07-07 DIAGNOSIS — M4126 Other idiopathic scoliosis, lumbar region: Secondary | ICD-10-CM | POA: Diagnosis not present

## 2020-07-07 DIAGNOSIS — M545 Low back pain: Secondary | ICD-10-CM | POA: Diagnosis not present

## 2020-07-07 DIAGNOSIS — G20 Parkinson's disease: Secondary | ICD-10-CM | POA: Diagnosis not present

## 2020-07-08 ENCOUNTER — Telehealth: Payer: Self-pay | Admitting: Family Medicine

## 2020-07-08 NOTE — Telephone Encounter (Signed)
Would avoid NSAID given upcoming surgery  Would offer stronger pain med - tylenol#3 (with codeine) while she gets in to see sports med. Recommend she go ahead and call for appointment with Dr Lorelei Pont for after surgery.

## 2020-07-08 NOTE — Telephone Encounter (Signed)
Patient called wanting advice on her arm pain. Patient was seen on 06/18/2020 about this issue.  Patient stated that she is using voltaren gel as instructed. However, the gel is not helping. Patient stated that she will be having eye surgery on Monday and will not be able to see sports medicine until after surgery.  Patient wanted to know what can she do in the meantime for the pain. Please advise.

## 2020-07-09 MED ORDER — ACETAMINOPHEN-CODEINE #3 300-30 MG PO TABS: 1.0000 | ORAL_TABLET | Freq: Three times a day (TID) | ORAL | 0 refills | Status: DC | PRN

## 2020-07-09 NOTE — Telephone Encounter (Signed)
Spoke with pt relaying Dr. Synthia Innocent message.  Pt verbalizes understanding and agrees to try Tylenol #3.  She will call back to schedule with Dr. Lorelei Pont.

## 2020-07-09 NOTE — Telephone Encounter (Signed)
ERx 

## 2020-07-09 NOTE — Addendum Note (Signed)
Addended by: [name] on: 07/09/2020 11:21 AM   Modules accepted: Orders

## 2020-07-10 ENCOUNTER — Other Ambulatory Visit: Payer: Self-pay | Admitting: Neurology

## 2020-07-12 DIAGNOSIS — H02231 Paralytic lagophthalmos right upper eyelid: Secondary | ICD-10-CM | POA: Diagnosis not present

## 2020-07-12 DIAGNOSIS — H02232 Paralytic lagophthalmos right lower eyelid: Secondary | ICD-10-CM | POA: Diagnosis not present

## 2020-07-18 NOTE — Progress Notes (Signed)
[name] T. Tara Bettendorf, MD, [place] at [facility] [place], [address]  Phone: [phone]  FAX: [place] - 74 y.o. female  MRN [id number]  Date of Birth: 02/01/1946  Date: 07/19/2020  PCP: [name], MD  Referral: [name], MD  Chief Complaint  Patient presents with  . Arm Pain    Left    This visit occurred during the SARS-CoV-2 public health emergency.  Safety protocols were in place, including screening questions prior to the visit, additional usage of staff PPE, and extensive cleaning of exam room while observing appropriate contact time as indicated for disinfecting solutions.   Subjective:   [name] is a 74 y.o. very pleasant female patient who presents with the following: shoulder pain  The patient noted above presents with L shoulder pain that has been ongoing for several months. there is no history of trauma or accident. The patient denies neck pain or radicular symptoms. No shoulder blade pain Denies dislocation, subluxation, separation of the shoulder. The patient does complain of pain with flexion, abduction, and terminal motion.  Significant restriction of motion. she describes a deep ache around the shoulder, and sometimes it will wake the patient up at night. She has parkinson's disease.  She is a pleasant 74 year old lady and she presents with some left-sided arm pain.  She saw my partner on June 19, 2019 when and at that point and he felt like she was having possibly some rotator cuff tendinopathy.  She also has fibromyalgia as it can found her.  Gave her some Voltaren gel as well as some tramadol.  Ultimately she called in again and was given some Tylenol 3.  No numbness.  Has been getting him worse and worse.  ? [facility] In [place] - work on motion  Medications Tried: OTC Ice or Heat: minimal help Tried PT:  No - not for shoulder  Prior shoulder Injury: No Prior surgery: No Prior fracture: No   Review of Systems is noted in the HPI, as appropriate  Objective:   Blood pressure 120/72, pulse 76, temperature (!) 97.3 F (36.3 C), temperature source Temporal, height 5\' 5"  (1.651 m), weight 166 lb (75.3 kg), SpO2 95 %.   GEN: No acute distress; alert,appropriate. PULM: Breathing comfortably in no respiratory distress PSYCH: Normally interactive.   Shoulder: R and L Inspection: No muscle wasting or winging Ecchymosis/edema: neg  AC joint, scapula, clavicle: NT Cervical spine: NT, full ROM Spurling's: neg ABNORMAL SIDE TESTED: l UNLESS OTHERWISE NOTED, THE CONTRALATERAL SIDE HAS FULL RANGE OF MOTION. Abduction: 5/5, LIMITED TO 80 DEGREES Flexion: 5/5, LIMITED TO 80 DEGNO ROM  IR, lift-off: 5/5. TESTED AT 90 DEGREES OF ABDUCTION, LIMITED TO 0 DEGREES ER at neutral:  5/5, TESTED AT 90 DEGREES OF ABDUCTION, LIMITED TO 25 DEGREES AC crossover and compression: PAIN Drop Test: neg Empty Can: neg Supraspinatus insertion: NT Bicipital groove: NT ALL OTHER SPECIAL TESTING EQUIVOCAL GIVEN LOSS OF MOTION C5-T1 intact Sensation intact Grip 5/5   Assessment and Plan:     ICD-10-CM   1. 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 Acute pain of left shoulder  M25.512 DG Shoulder Left    DG Shoulder Left  3. Fibromyalgia  M79.7   4. Parkinson's disease ([place])  G20    Total encounter time: 30 minutes. This includes total time spent on the day  of encounter.  Record review, reviewing medical literature regarding eye surgery, rehab and anatomy discussion.  Patient was given a systematic ROM protocol from [facility] to be done daily. Emphasized importance of adherence, help of PT, daily HEP.  The average length of total symptoms is 12-18 months going through 3 different phases in the freezing and thawing process. Reviewed all with patient.   Tylenol or NSAID of choice prn for pain  relief Intraarticular shoulder injections discussed with patient, which have good evidence for accelerating the thawing phase. Patient will be sent for formal PT for aggressive frozen shoulder ROM if sx persist. Will need RTC str and scapular stabilization to fix underlying mechanics.  Parkinson's contributes risk, and I suspect she will have a longer period of recovery.  Hold off on intraarticular injections s/p eye surgery 1 week ago.  Follow-up: Return in about 6 weeks (around 08/30/2020).  No orders of the defined types were placed in this encounter.  Medications Discontinued During This Encounter  Medication Reason  . acetaminophen-codeine (TYLENOL #3) 300-30 MG tablet Completed Course   Orders Placed This Encounter  Procedures  . DG Shoulder Left    Signed,  Frederico Hamman T. Kaylina Cahue, MD   Outpatient Encounter Medications as of 07/19/2020  Medication Sig  . Artificial Tear Ointment (DRY EYES OP) Place 1 application into the right eye 4 (four) times daily.   . Calcium Carbonate-Vitamin D (CALCIUM 600/VITAMIN D) 600-400 MG-UNIT chew tablet Chew 1 tablet by mouth daily.  . carbidopa-levodopa (SINEMET CR) 50-200 MG tablet Take 1 tablet by mouth at bedtime.  . carbidopa-levodopa (SINEMET IR) 25-100 MG tablet Take 1 tablet by mouth 4 (four) times daily.  . Cholecalciferol (VITAMIN D3) 25 MCG (1000 UT) CAPS Take 1 capsule (1,000 Units total) by mouth daily.  . clonazePAM (KLONOPIN) 0.5 MG tablet Take 1 tablet (0.5 mg total) by mouth 2 (two) times daily as needed.  . cyanocobalamin (,VITAMIN B-12,) 1000 MCG/ML injection INJECT 1 ML (1,000 MCG TOTAL) INTO THE MUSCLE EVERY 30 (THIRTY) DAYS.  Marland Kitchen gabapentin (NEURONTIN) 100 MG capsule Take 2 capsules (200 mg total) by mouth at bedtime.  . polyethylene glycol (MIRALAX / GLYCOLAX) packet Take 17 g by mouth as needed.  . pramipexole (MIRAPEX) 0.5 MG tablet Take 1 tablet (0.5 mg total) by mouth 3 (three) times daily.  . sertraline (ZOLOFT) 100 MG  tablet Take 1 tablet (100 mg total) by mouth daily.  Marland Kitchen tiZANidine (ZANAFLEX) 4 MG tablet Take 1 tablet (4 mg total) by mouth every 6 (six) hours as needed for muscle spasms.  . traMADol (ULTRAM) 50 MG tablet Take 1 tablet (50 mg total) by mouth 3 (three) times daily as needed.  . triamcinolone cream (KENALOG) 0.1 % APPLY TO AFFECTED AREA TWICE A DAY  . [DISCONTINUED] acetaminophen-codeine (TYLENOL #3) 300-30 MG tablet Take 1 tablet by mouth 3 (three) times daily as needed for moderate pain. Don't mix with tramadol   No facility-administered encounter medications on file as of 07/19/2020.

## 2020-07-19 ENCOUNTER — Encounter: Payer: Self-pay | Admitting: Family Medicine

## 2020-07-19 ENCOUNTER — Other Ambulatory Visit: Payer: Self-pay

## 2020-07-19 ENCOUNTER — Ambulatory Visit (INDEPENDENT_AMBULATORY_CARE_PROVIDER_SITE_OTHER): Payer: Medicare Other | Admitting: Family Medicine

## 2020-07-19 ENCOUNTER — Ambulatory Visit (INDEPENDENT_AMBULATORY_CARE_PROVIDER_SITE_OTHER)
Admission: RE | Admit: 2020-07-19 | Discharge: 2020-07-19 | Disposition: A | Discharge status: Home / Self Care | Payer: Medicare Other | Source: Ambulatory Visit | Attending: Family Medicine | Admitting: Family Medicine

## 2020-07-19 VITALS — BP 120/72 | HR 76 | Temp 97.3°F | Ht 65.0 in | Wt 166.0 lb

## 2020-07-19 DIAGNOSIS — M25512 Pain in left shoulder: Secondary | ICD-10-CM

## 2020-07-19 DIAGNOSIS — M797 Fibromyalgia: Secondary | ICD-10-CM | POA: Diagnosis not present

## 2020-07-19 DIAGNOSIS — G20 Parkinson's disease: Secondary | ICD-10-CM | POA: Diagnosis not present

## 2020-07-19 DIAGNOSIS — M7502 Adhesive capsulitis of left shoulder: Secondary | ICD-10-CM | POA: Diagnosis not present

## 2020-08-04 ENCOUNTER — Telehealth: Payer: Self-pay

## 2020-08-04 DIAGNOSIS — G20 Parkinson's disease: Secondary | ICD-10-CM

## 2020-08-04 NOTE — Telephone Encounter (Signed)
Spoke with [name] and she stated the patient has called and wants to restart therapy. Patient had surgery in the past and has been cleared to restart physical. [name] is requesting a new referral.

## 2020-08-05 NOTE — Telephone Encounter (Signed)
Referral placed and faxed.

## 2020-08-05 NOTE — Telephone Encounter (Signed)
Yes

## 2020-08-09 DIAGNOSIS — G20 Parkinson's disease: Secondary | ICD-10-CM | POA: Diagnosis not present

## 2020-08-09 DIAGNOSIS — R278 Other lack of coordination: Secondary | ICD-10-CM | POA: Diagnosis not present

## 2020-08-09 DIAGNOSIS — M6281 Muscle weakness (generalized): Secondary | ICD-10-CM | POA: Diagnosis not present

## 2020-08-09 DIAGNOSIS — R2689 Other abnormalities of gait and mobility: Secondary | ICD-10-CM | POA: Diagnosis not present

## 2020-08-09 DIAGNOSIS — R2681 Unsteadiness on feet: Secondary | ICD-10-CM | POA: Diagnosis not present

## 2020-08-09 DIAGNOSIS — M545 Low back pain: Secondary | ICD-10-CM | POA: Diagnosis not present

## 2020-08-11 DIAGNOSIS — R278 Other lack of coordination: Secondary | ICD-10-CM | POA: Diagnosis not present

## 2020-08-11 DIAGNOSIS — R2681 Unsteadiness on feet: Secondary | ICD-10-CM | POA: Diagnosis not present

## 2020-08-11 DIAGNOSIS — G20 Parkinson's disease: Secondary | ICD-10-CM | POA: Diagnosis not present

## 2020-08-11 DIAGNOSIS — M545 Low back pain: Secondary | ICD-10-CM | POA: Diagnosis not present

## 2020-08-11 DIAGNOSIS — R2689 Other abnormalities of gait and mobility: Secondary | ICD-10-CM | POA: Diagnosis not present

## 2020-08-11 DIAGNOSIS — M6281 Muscle weakness (generalized): Secondary | ICD-10-CM | POA: Diagnosis not present

## 2020-08-12 ENCOUNTER — Other Ambulatory Visit: Payer: Self-pay

## 2020-08-12 ENCOUNTER — Ambulatory Visit (INDEPENDENT_AMBULATORY_CARE_PROVIDER_SITE_OTHER): Payer: Medicare Other

## 2020-08-12 DIAGNOSIS — E538 Deficiency of other specified B group vitamins: Secondary | ICD-10-CM | POA: Diagnosis not present

## 2020-08-12 DIAGNOSIS — Z23 Encounter for immunization: Secondary | ICD-10-CM

## 2020-08-12 MED ORDER — CYANOCOBALAMIN 1000 MCG/ML IJ SOLN
1000.0000 ug | Freq: Once | INTRAMUSCULAR | Status: AC
  Administered 2020-08-12: 1000 ug via INTRAMUSCULAR

## 2020-08-12 NOTE — Progress Notes (Signed)
Per orders of Dr. Damita Dunnings, injection of B12 given by Kem Parkinson. Patient tolerated injection well.  PCP out of the office

## 2020-08-13 ENCOUNTER — Telehealth: Payer: Self-pay

## 2020-08-13 NOTE — Telephone Encounter (Signed)
Referral  Type of referral: Physical Therapy  Provider Office/ Name:  [facility]  Phone: [phone]  Fax: [phone]  Address: n/a   Appointment Date and Time: 08/09/20

## 2020-08-17 ENCOUNTER — Other Ambulatory Visit: Payer: Self-pay | Admitting: Family Medicine

## 2020-08-17 NOTE — Telephone Encounter (Signed)
Name of Medication: Clonazepam Name of Pharmacy: [facility] [address] Last Fill or Written Date and Quantity: 06/24/20, #60 Last Office Visit and Type: 06/18/20, 4 mo f/u Next Office Visit and Type: 10/27/20, AWV prt 2 Last Controlled Substance Agreement Date: 04/28/16 Last UDS: 04/28/16

## 2020-08-18 DIAGNOSIS — M545 Low back pain: Secondary | ICD-10-CM | POA: Diagnosis not present

## 2020-08-18 DIAGNOSIS — R2681 Unsteadiness on feet: Secondary | ICD-10-CM | POA: Diagnosis not present

## 2020-08-18 DIAGNOSIS — M6281 Muscle weakness (generalized): Secondary | ICD-10-CM | POA: Diagnosis not present

## 2020-08-18 DIAGNOSIS — R278 Other lack of coordination: Secondary | ICD-10-CM | POA: Diagnosis not present

## 2020-08-18 DIAGNOSIS — R2689 Other abnormalities of gait and mobility: Secondary | ICD-10-CM | POA: Diagnosis not present

## 2020-08-18 DIAGNOSIS — G20 Parkinson's disease: Secondary | ICD-10-CM | POA: Diagnosis not present

## 2020-08-18 NOTE — Telephone Encounter (Signed)
ERx 

## 2020-08-20 DIAGNOSIS — H1789 Other corneal scars and opacities: Secondary | ICD-10-CM | POA: Diagnosis not present

## 2020-08-24 ENCOUNTER — Telehealth: Payer: Self-pay | Admitting: Neurology

## 2020-08-24 ENCOUNTER — Other Ambulatory Visit: Payer: Self-pay

## 2020-08-24 MED ORDER — PRAMIPEXOLE DIHYDROCHLORIDE 0.5 MG PO TABS: 0.5000 mg | ORAL_TABLET | Freq: Three times a day (TID) | ORAL | 0 refills | Status: DC

## 2020-08-26 DIAGNOSIS — R2681 Unsteadiness on feet: Secondary | ICD-10-CM | POA: Diagnosis not present

## 2020-08-26 DIAGNOSIS — M545 Low back pain: Secondary | ICD-10-CM | POA: Diagnosis not present

## 2020-08-26 DIAGNOSIS — R278 Other lack of coordination: Secondary | ICD-10-CM | POA: Diagnosis not present

## 2020-08-26 DIAGNOSIS — M6281 Muscle weakness (generalized): Secondary | ICD-10-CM | POA: Diagnosis not present

## 2020-08-26 DIAGNOSIS — R2689 Other abnormalities of gait and mobility: Secondary | ICD-10-CM | POA: Diagnosis not present

## 2020-08-26 DIAGNOSIS — G20 Parkinson's disease: Secondary | ICD-10-CM | POA: Diagnosis not present

## 2020-08-30 ENCOUNTER — Ambulatory Visit: Payer: Medicare Other | Admitting: Family Medicine

## 2020-08-30 ENCOUNTER — Other Ambulatory Visit: Payer: Self-pay | Admitting: Neurology

## 2020-08-31 NOTE — Telephone Encounter (Signed)
Reviewed chart and rx was sent on 08/24/2020 to [facility]. Pharmacist stated the patient did not pickup the rx. Requested she cancel the rx.  [facility].

## 2020-09-01 ENCOUNTER — Ambulatory Visit: Payer: Medicare Other | Admitting: Family Medicine

## 2020-09-01 DIAGNOSIS — G20 Parkinson's disease: Secondary | ICD-10-CM | POA: Diagnosis not present

## 2020-09-01 DIAGNOSIS — R278 Other lack of coordination: Secondary | ICD-10-CM | POA: Diagnosis not present

## 2020-09-01 DIAGNOSIS — R2681 Unsteadiness on feet: Secondary | ICD-10-CM | POA: Diagnosis not present

## 2020-09-01 DIAGNOSIS — M545 Low back pain, unspecified: Secondary | ICD-10-CM | POA: Diagnosis not present

## 2020-09-01 DIAGNOSIS — M6281 Muscle weakness (generalized): Secondary | ICD-10-CM | POA: Diagnosis not present

## 2020-09-01 DIAGNOSIS — R2689 Other abnormalities of gait and mobility: Secondary | ICD-10-CM | POA: Diagnosis not present

## 2020-09-15 ENCOUNTER — Ambulatory Visit (INDEPENDENT_AMBULATORY_CARE_PROVIDER_SITE_OTHER): Payer: Medicare Other

## 2020-09-15 ENCOUNTER — Other Ambulatory Visit: Payer: Self-pay

## 2020-09-15 DIAGNOSIS — E538 Deficiency of other specified B group vitamins: Secondary | ICD-10-CM

## 2020-09-15 MED ORDER — CYANOCOBALAMIN 1000 MCG/ML IJ SOLN
1000.0000 ug | Freq: Once | INTRAMUSCULAR | Status: AC
  Administered 2020-09-15: 1000 ug via INTRAMUSCULAR

## 2020-09-15 NOTE — Progress Notes (Signed)
Per orders of Dr. Danise Mina, injection of B12 given in R deltoid by Randall An. Patient tolerated injection well.

## 2020-09-21 NOTE — Progress Notes (Signed)
Assessment/Plan:    1.  Parkinsons Disease             -change timing and slight increase carbidopa/levodopa 25/100, 2 tablet at 6am/ and 1 tablet 9:30am/1pm/4:30pm.  May take an extra 1/2 tablet if needed in the evening             -Continue carbidopa/levodopa 50/200 at bedtime             -Continue pramipexole 0.5 mg 3 times per day  -long discussion about dbs.  I talked to the patient about the logistics associated with DBS therapy.  I talked to the patient about risks/benefits/side effects of DBS therapy.  We talked about risks which included but were not limited to infection, paralysis, intraoperative seizure, death, stroke, bleeding around the electrode.   I talked to patient about fiducial placement 1 week prior to DBS therapy.  I talked to the patient about what to expect in the operating room, including the fact that this is an awake surgery.  We talked about battery placement as well as which is done under general anesthesia, generally approximately one week following the initial surgery.  We also talked about the fact that the patient will need to be off of medications for surgery.  The patient and family were given the opportunity to ask questions, which they did, and I answered them to the best of my ability today.  They will think about it and we will talk about it further in the future  -rx lift chair  -use walker at all times 2. Dyskinesia -Now off of amantadine because of hallucinations. -Have reduced pramipexole because of significant dyskinesia as well. 3.  Neurogenic Orthostatic Hypotension             -Did well on Northera.  Patient stopped that on her own.  We will need to watch that.  Denies lightheaded now 4.  Depression             -On sertraline             -Declines counseling 5. b12 deficiency -on injections. 6. Urinary incontinence -on myrbetriq. She is doing well in that regard 7. Lumbar  radiculopathy -Sheis status post lumbar foraminotomy with Dr. Vertell Limber on November 27, 2019 9. 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  My previous records were reviewed prior to todays visit as well as outside records available to me. Pt with son who supplements history.  Pt denies falls.  Pt denies lightheadedness, near syncope. Rare hallucination - thought that there was something on the floor in church on Sunday.  [facility] She had a break in therapy and just got restarted back in September but she states that she didn't get restarted in Parkinsons Disease therapy but has gone to back and shoulder therapy. Records from Dr. Edilia Bo are reviewed. She saw him for shoulder adhesive capsulitis.  More trouble with stiffness and turning over in bed.  Living with son or daughters.  Son states memory is good.  His only concern is physical movements and they want to discuss dbs therapy.  Current prescribed movement disorder medications: Carbidopa/levodopa 25/100, 1 tablet at 6 AM/10 AM/2 PM/6 PM Carbidopa/levodopa 50/200 at bedtime Pramipexole 0.5 mg 3  times per day (decreased slightly last visit because of dyskinesia) Sertraline, 100 mg daily  PREVIOUS MEDICATIONS: northera (worked well but pt d/c when they quit sending b/c she owed $); amantadine (discontinued because of hallucinations); pramipexole (decreasing because of hallucinations/dyskinesia)  ALLERGIES:   Allergies  Allergen Reactions  . Amitriptyline Other (See Comments)    Sedated next morning  . Ciprofloxacin Nausea And Vomiting  . Cymbalta [Duloxetine Hcl] Other (See Comments)    Worsened depression  . Imipramine Hcl     REACTION: rash  . Iohexol       Code: HIVES, Desc: PT developed 2 hives, followed by SOB, severe headache post 87cc's Omnipaque 300., Onset Date: [id number]   . Lyrica [Pregabalin] Other (See Comments)    Tried during hospitalization - unsure effects but unable to tolerate  . Morphine Sulfate     REACTION: rash  . Sulfamethoxazole     REACTION: rash  . Iodine Rash  . Lidocaine Hcl Rash  . Neosporin [Neomycin-Bacitracin Zn-Polymyx] Rash    Worsened skin breaking out  . Tetracyclines & Related Rash    CURRENT MEDICATIONS:  Outpatient Encounter Medications as of 09/29/2020  Medication Sig  . Artificial Tear Ointment (DRY EYES OP) Place 1 application into the right eye 4 (four) times daily.   . Calcium Carbonate-Vitamin D (CALCIUM 600/VITAMIN D) 600-400 MG-UNIT chew tablet Chew 1 tablet by mouth daily.  . carbidopa-levodopa (SINEMET CR) 50-200 MG tablet Take 1 tablet by mouth at bedtime.  . carbidopa-levodopa (SINEMET IR) 25-100 MG tablet Take 1 tablet by mouth 4 (four) times daily.  . Cholecalciferol (VITAMIN D3) 25 MCG (1000 UT) CAPS Take 1 capsule (1,000 Units total) by mouth daily.  . clonazePAM (KLONOPIN) 0.5 MG tablet TAKE 1 TABLET(0.5 MG) BY MOUTH TWICE DAILY AS NEEDED  . cyanocobalamin (,VITAMIN B-12,) 1000 MCG/ML injection INJECT 1 ML (1,000 MCG TOTAL) INTO THE MUSCLE EVERY 30 (THIRTY) DAYS.  Marland Kitchen gabapentin (NEURONTIN) 100 MG capsule Take 2 capsules (200 mg total) by mouth at bedtime.  . polyethylene glycol (MIRALAX / GLYCOLAX) packet Take 17 g by mouth as needed.  . pramipexole (MIRAPEX) 0.5 MG tablet TAKE 1 TABLET BY MOUTH 3  TIMES DAILY  . sertraline (ZOLOFT) 100 MG tablet Take 1 tablet (100 mg total) by mouth daily.  . traMADol (ULTRAM) 50 MG tablet Take 1 tablet (50 mg total) by mouth 3 (three) times daily as needed.  . triamcinolone cream (KENALOG) 0.1 % APPLY TO AFFECTED AREA TWICE A DAY  . [DISCONTINUED] carbidopa-levodopa (SINEMET IR) 25-100 MG tablet Take 1 tablet by mouth 4 (four) times daily.  .  [DISCONTINUED] tiZANidine (ZANAFLEX) 4 MG tablet Take 1 tablet (4 mg total) by mouth every 6 (six) hours as needed for muscle spasms. (Patient not taking: Reported on 09/29/2020)   No facility-administered encounter medications on file as of 09/29/2020.    Objective:   PHYSICAL EXAMINATION:    VITALS:   Vitals:   09/29/20 0917  BP: (!) 157/87  Pulse: 78  SpO2: 97%  Weight: 166 lb (75.3 kg)  Height: 5\' 5"  (1.651 m)    GEN:  The patient appears stated age and is in NAD. HEENT:  Normocephalic, atraumatic.  The mucous membranes are moist. The superficial temporal arteries are without ropiness or tenderness. CV:  RRR Lungs:  CTAB Neck/HEME:  There are no carotid bruits bilaterally.  Neurological examination:  Orientation: The patient is alert and oriented x3. Cranial nerves: There is good facial symmetry with significant facial hypomimia. The  speech is fluent and clear. Soft palate rises symmetrically and there is no tongue deviation. Hearing is intact to conversational tone. Sensation: Sensation is intact to light touch throughout Motor: Strength is at least antigravity x4.  Movement examination: Tone: There is mod increased tone in the RUE (time for med) Abnormal movements: there is RUE rest tremor Coordination:  There is  decremation with RAM's, with any form of RAMS, including alternating supination and pronation of the forearm, hand opening and closing, finger taps, heel taps and toe taps, R>L Gait and Station: The patient has  difficulty arising out of a deep-seated chair without the use of the hands. The patient's stride length is marked decreased.   I have reviewed and interpreted the following labs independently    Chemistry      Component Value Date/Time   NA 140 06/18/2020 1000   K 4.2 06/18/2020 1000   CL 103 06/18/2020 1000   CO2 30 06/18/2020 1000   BUN 16 06/18/2020 1000   CREATININE 0.78 06/18/2020 1000   CREATININE 0.87 10/08/2019 0921      Component Value  Date/Time   CALCIUM 10.1 06/18/2020 1000   ALKPHOS 62 10/04/2018 0856   AST 9 (L) 10/08/2019 0921   ALT <3 (L) 10/08/2019 0921   BILITOT 0.5 10/08/2019 0921       Lab Results  Component Value Date   WBC 5.2 06/18/2020   HGB 11.4 (L) 06/18/2020   HCT 35.2 (L) 06/18/2020   MCV 86.8 06/18/2020   PLT 276.0 06/18/2020    Lab Results  Component Value Date   TSH 0.49 10/04/2018     Total time spent on today's visit was 50 minutes, including both face-to-face time and nonface-to-face time.  Time included that spent on review of records (prior notes available to me/labs/imaging if pertinent), discussing treatment and goals, answering patient's questions and coordinating care.  Cc:  [name], MD

## 2020-09-28 ENCOUNTER — Encounter: Payer: Self-pay | Admitting: Family Medicine

## 2020-09-28 ENCOUNTER — Other Ambulatory Visit: Payer: Self-pay

## 2020-09-28 DIAGNOSIS — H02233 Paralytic lagophthalmos right eye, unspecified eyelid: Secondary | ICD-10-CM

## 2020-09-28 HISTORY — DX: Paralytic lagophthalmos right eye, unspecified eyelid: H02.233

## 2020-09-28 MED ORDER — CARBIDOPA-LEVODOPA 25-100 MG PO TABS: 1.0000 | ORAL_TABLET | Freq: Four times a day (QID) | ORAL | 1 refills | Status: DC

## 2020-09-28 NOTE — Telephone Encounter (Signed)
Rx(s) sent to pharmacy electronically.  

## 2020-09-28 NOTE — Telephone Encounter (Signed)
Patient called in and is going to run out of her carbidopa-levodopa IR today. She would like a prescription to be sent to [facility] [address].

## 2020-09-29 ENCOUNTER — Ambulatory Visit: Payer: Medicare Other | Admitting: Neurology

## 2020-09-29 ENCOUNTER — Encounter: Payer: Self-pay | Admitting: Neurology

## 2020-09-29 ENCOUNTER — Other Ambulatory Visit: Payer: Self-pay

## 2020-09-29 VITALS — BP 157/87 | HR 78 | Ht 65.0 in | Wt 166.0 lb

## 2020-09-29 DIAGNOSIS — G249 Dystonia, unspecified: Secondary | ICD-10-CM | POA: Diagnosis not present

## 2020-09-29 DIAGNOSIS — G20 Parkinson's disease: Secondary | ICD-10-CM

## 2020-09-29 MED ORDER — AMBULATORY NON FORMULARY MEDICATION: 0 refills | Status: AC

## 2020-09-29 MED ORDER — CARBIDOPA-LEVODOPA 25-100 MG PO TABS
ORAL_TABLET | ORAL | 1 refills | Status: DC
Start: 2020-09-29 — End: 2020-12-22

## 2020-09-29 NOTE — Patient Instructions (Addendum)
1.  carbidopa/levodopa 25/100, 2 tablet at 6am/ and 1 tablet 9:30am/1pm/4:30pm.  May take an extra 1/2 tablet if needed in the evening if needed 2.   carbidopa/levodopa 50/200 at bedtime 3.  Continue pramipexole 0.5 mg 3 times per day 4.  Use walker at ALL times

## 2020-09-30 ENCOUNTER — Telehealth: Payer: Self-pay | Admitting: Neurology

## 2020-09-30 NOTE — Telephone Encounter (Signed)
Patient called in about the lift chair prescription she got yesterday from Dr. Carles Collet she said she went a few places and they told her that her insurance wouldn't cover it. She called her insurance and they told her Dr. Carles Collet needed to call [phone] and give more information on why the patient needs this chair for it to be covered.

## 2020-09-30 NOTE — Telephone Encounter (Signed)
I've never had a lift chair not be covered for Parkinsons Disease.  Pts usually have a copay of about 20%.  The diagnosis was on the RX.  Did she take it to a medical supply store?  They are usually very familiar with lift chairs and how to get them covered.

## 2020-10-01 ENCOUNTER — Ambulatory Visit: Payer: Medicare Other | Admitting: Neurology

## 2020-10-01 NOTE — Telephone Encounter (Signed)
Try [facility].  I don't deal with insurance issues but I also haven't had these declined previously.  She will probably need to call her insurance as well but I would contact [place] first

## 2020-10-01 NOTE — Telephone Encounter (Signed)
Spoke with patient who states she went to [facility] and was informed that she could not use her insurance. Patient states Dr [name] suggested she try [facility] but she has not contacted them yet. She wants to know if Dr [name] can recommend any other medical centers.

## 2020-10-01 NOTE — Telephone Encounter (Signed)
Patient notified and voiced understanding.

## 2020-10-05 DIAGNOSIS — R2689 Other abnormalities of gait and mobility: Secondary | ICD-10-CM | POA: Diagnosis not present

## 2020-10-05 DIAGNOSIS — M6281 Muscle weakness (generalized): Secondary | ICD-10-CM | POA: Diagnosis not present

## 2020-10-05 DIAGNOSIS — G20 Parkinson's disease: Secondary | ICD-10-CM | POA: Diagnosis not present

## 2020-10-05 DIAGNOSIS — R2681 Unsteadiness on feet: Secondary | ICD-10-CM | POA: Diagnosis not present

## 2020-10-05 DIAGNOSIS — R278 Other lack of coordination: Secondary | ICD-10-CM | POA: Diagnosis not present

## 2020-10-05 DIAGNOSIS — M545 Low back pain, unspecified: Secondary | ICD-10-CM | POA: Diagnosis not present

## 2020-10-12 DIAGNOSIS — R2681 Unsteadiness on feet: Secondary | ICD-10-CM | POA: Diagnosis not present

## 2020-10-12 DIAGNOSIS — M6281 Muscle weakness (generalized): Secondary | ICD-10-CM | POA: Diagnosis not present

## 2020-10-12 DIAGNOSIS — R2689 Other abnormalities of gait and mobility: Secondary | ICD-10-CM | POA: Diagnosis not present

## 2020-10-12 DIAGNOSIS — M545 Low back pain, unspecified: Secondary | ICD-10-CM | POA: Diagnosis not present

## 2020-10-12 DIAGNOSIS — R278 Other lack of coordination: Secondary | ICD-10-CM | POA: Diagnosis not present

## 2020-10-12 DIAGNOSIS — G20 Parkinson's disease: Secondary | ICD-10-CM | POA: Diagnosis not present

## 2020-10-14 ENCOUNTER — Other Ambulatory Visit: Payer: Self-pay | Admitting: Family Medicine

## 2020-10-14 NOTE — Telephone Encounter (Signed)
Name of Medication: Clonazepam Name of Pharmacy: [facility] [address] Last Fill or Written Date and Quantity: 08/18/20, #60 Last Office Visit and Type: 06/18/20, 4 mo f/u Next Office Visit and Type: 10/27/20, AWV prt 2 Last Controlled Substance Agreement Date: 04/28/16 Last UDS: 04/28/16

## 2020-10-14 NOTE — Telephone Encounter (Signed)
ERx 

## 2020-10-21 ENCOUNTER — Other Ambulatory Visit: Payer: Self-pay | Admitting: Neurology

## 2020-10-24 ENCOUNTER — Other Ambulatory Visit: Payer: Self-pay | Admitting: Family Medicine

## 2020-10-24 DIAGNOSIS — D649 Anemia, unspecified: Secondary | ICD-10-CM

## 2020-10-24 DIAGNOSIS — E559 Vitamin D deficiency, unspecified: Secondary | ICD-10-CM

## 2020-10-24 DIAGNOSIS — E785 Hyperlipidemia, unspecified: Secondary | ICD-10-CM

## 2020-10-24 DIAGNOSIS — E538 Deficiency of other specified B group vitamins: Secondary | ICD-10-CM

## 2020-10-25 ENCOUNTER — Ambulatory Visit: Payer: 59

## 2020-10-25 ENCOUNTER — Other Ambulatory Visit: Payer: Self-pay

## 2020-10-25 ENCOUNTER — Other Ambulatory Visit (INDEPENDENT_AMBULATORY_CARE_PROVIDER_SITE_OTHER): Payer: Medicare Other

## 2020-10-25 DIAGNOSIS — E559 Vitamin D deficiency, unspecified: Secondary | ICD-10-CM

## 2020-10-25 DIAGNOSIS — D649 Anemia, unspecified: Secondary | ICD-10-CM

## 2020-10-25 DIAGNOSIS — E538 Deficiency of other specified B group vitamins: Secondary | ICD-10-CM

## 2020-10-25 DIAGNOSIS — E785 Hyperlipidemia, unspecified: Secondary | ICD-10-CM | POA: Diagnosis not present

## 2020-10-25 LAB — CBC WITH DIFFERENTIAL/PLATELET
Basophils Absolute: 0 10*3/uL (ref 0.0–0.1)
Basophils Relative: 0.5 % (ref 0.0–3.0)
Eosinophils Absolute: 0.1 10*3/uL (ref 0.0–0.7)
Eosinophils Relative: 3.3 % (ref 0.0–5.0)
HCT: 33.8 % — ABNORMAL LOW (ref 36.0–46.0)
Hemoglobin: 11.2 g/dL — ABNORMAL LOW (ref 12.0–15.0)
Lymphocytes Relative: 38.8 % (ref 12.0–46.0)
Lymphs Abs: 1.5 10*3/uL (ref 0.7–4.0)
MCHC: 33 g/dL (ref 30.0–36.0)
MCV: 87.2 fl (ref 78.0–100.0)
Monocytes Absolute: 0.3 10*3/uL (ref 0.1–1.0)
Monocytes Relative: 8.1 % (ref 3.0–12.0)
Neutro Abs: 1.9 10*3/uL (ref 1.4–7.7)
Neutrophils Relative %: 49.3 % (ref 43.0–77.0)
Platelets: 248 10*3/uL (ref 150.0–400.0)
RBC: 3.88 Mil/uL (ref 3.87–5.11)
RDW: 15.4 % (ref 11.5–15.5)
WBC: 3.8 10*3/uL — ABNORMAL LOW (ref 4.0–10.5)

## 2020-10-25 LAB — COMPREHENSIVE METABOLIC PANEL
ALT: 3 U/L (ref 0–35)
AST: 13 U/L (ref 0–37)
Albumin: 4.1 g/dL (ref 3.5–5.2)
Alkaline Phosphatase: 54 U/L (ref 39–117)
BUN: 16 mg/dL (ref 6–23)
CO2: 29 mEq/L (ref 19–32)
Calcium: 9.2 mg/dL (ref 8.4–10.5)
Chloride: 103 mEq/L (ref 96–112)
Creatinine, Ser: 0.62 mg/dL (ref 0.40–1.20)
GFR: 89.02 mL/min (ref >60.00)
Glucose, Bld: 97 mg/dL (ref 70–99)
Potassium: 4 mEq/L (ref 3.5–5.1)
Sodium: 139 mEq/L (ref 135–145)
Total Bilirubin: 0.4 mg/dL (ref 0.2–1.2)
Total Protein: 7 g/dL (ref 6.0–8.3)

## 2020-10-25 LAB — LIPID PANEL
Cholesterol: 193 mg/dL (ref 0–200)
HDL: 53.5 mg/dL (ref >39.00)
LDL Cholesterol: 113 mg/dL — ABNORMAL HIGH (ref 0–99)
NonHDL: 139.87
Total CHOL/HDL Ratio: 4
Triglycerides: 132 mg/dL (ref 0.0–149.0)
VLDL: 26.4 mg/dL (ref 0.0–40.0)

## 2020-10-25 LAB — VITAMIN D 25 HYDROXY (VIT D DEFICIENCY, FRACTURES): VITD: 32.8 ng/mL (ref 30.00–100.00)

## 2020-10-25 LAB — VITAMIN B12: Vitamin B-12: 311 pg/mL (ref 211–911)

## 2020-10-27 ENCOUNTER — Other Ambulatory Visit: Payer: Self-pay

## 2020-10-27 ENCOUNTER — Encounter: Payer: Self-pay | Admitting: Family Medicine

## 2020-10-27 ENCOUNTER — Ambulatory Visit (INDEPENDENT_AMBULATORY_CARE_PROVIDER_SITE_OTHER): Payer: Medicare Other | Admitting: Family Medicine

## 2020-10-27 VITALS — BP 126/80 | HR 78 | Temp 97.5°F | Ht 64.0 in | Wt 166.0 lb

## 2020-10-27 DIAGNOSIS — Z0000 Encounter for general adult medical examination without abnormal findings: Secondary | ICD-10-CM

## 2020-10-27 DIAGNOSIS — Z7189 Other specified counseling: Secondary | ICD-10-CM

## 2020-10-27 DIAGNOSIS — E538 Deficiency of other specified B group vitamins: Secondary | ICD-10-CM | POA: Diagnosis not present

## 2020-10-27 DIAGNOSIS — F5104 Psychophysiologic insomnia: Secondary | ICD-10-CM

## 2020-10-27 DIAGNOSIS — D649 Anemia, unspecified: Secondary | ICD-10-CM

## 2020-10-27 DIAGNOSIS — B0052 Herpesviral keratitis: Secondary | ICD-10-CM

## 2020-10-27 DIAGNOSIS — E785 Hyperlipidemia, unspecified: Secondary | ICD-10-CM

## 2020-10-27 DIAGNOSIS — G20 Parkinson's disease: Secondary | ICD-10-CM

## 2020-10-27 DIAGNOSIS — E559 Vitamin D deficiency, unspecified: Secondary | ICD-10-CM

## 2020-10-27 DIAGNOSIS — F321 Major depressive disorder, single episode, moderate: Secondary | ICD-10-CM

## 2020-10-27 DIAGNOSIS — H02231 Paralytic lagophthalmos right upper eyelid: Secondary | ICD-10-CM

## 2020-10-27 DIAGNOSIS — F411 Generalized anxiety disorder: Secondary | ICD-10-CM

## 2020-10-27 DIAGNOSIS — G903 Multi-system degeneration of the autonomic nervous system: Secondary | ICD-10-CM

## 2020-10-27 DIAGNOSIS — M810 Age-related osteoporosis without current pathological fracture: Secondary | ICD-10-CM

## 2020-10-27 DIAGNOSIS — K5900 Constipation, unspecified: Secondary | ICD-10-CM

## 2020-10-27 HISTORY — PX: PTOSIS REPAIR: SHX6568

## 2020-10-27 MED ORDER — CLONAZEPAM 0.5 MG PO TABS
ORAL_TABLET | ORAL | 0 refills | Status: DC
Start: 2020-10-27 — End: 2021-02-01

## 2020-10-27 MED ORDER — TRAMADOL HCL 50 MG PO TABS: 50.0000 mg | ORAL_TABLET | Freq: Three times a day (TID) | ORAL | 0 refills | Status: DC | PRN

## 2020-10-27 MED ORDER — CYANOCOBALAMIN 1000 MCG/ML IJ SOLN
1000.0000 ug | Freq: Once | INTRAMUSCULAR | Status: AC
  Administered 2020-10-27: 1000 ug via INTRAMUSCULAR

## 2020-10-27 NOTE — Assessment & Plan Note (Signed)

## 2020-10-27 NOTE — Assessment & Plan Note (Signed)
Advanced directives: [name] and Robby sons would be HCPOA. Has packet at home - continues working on this.Would be ok with CPR and intubation as well as feeding tube, but doesn't want prolonged life support if terminal condition.

## 2020-10-27 NOTE — Patient Instructions (Addendum)
Call Dr Karsten Ro for myrbetriq refill.  You will be due for repeat colonoscopy next month.  Consider covid booster.  If interested, check with pharmacy about new 2 shot shingles series (shingrix).  Increase fiber in the diet including regular prunes and kiwi. Increase water as well. Continue miralax 1 capful daily, hold if cramping. Let us know if increased fiber and water doesn't help to consider additional medicine.  Bring [place] copy of advanced directives when completed.  Return as needed or in 6 months for follow up visit.   Health Maintenance After Age 16 After age 40, you are at a higher risk for certain long-term diseases and infections as well as injuries from falls. Falls are a major cause of broken bones and head injuries in people who are older than age 59. Getting regular preventive care can help to keep you healthy and well. Preventive care includes getting regular testing and making lifestyle changes as recommended by your health care provider. Talk with your health care provider about:  Which screenings and tests you should have. A screening is a test that checks for a disease when you have no symptoms.  A diet and exercise plan that is right for you. What should I know about screenings and tests to prevent falls? Screening and testing are the best ways to find a health problem early. Early diagnosis and treatment give you the best chance of managing medical conditions that are common after age 28. Certain conditions and lifestyle choices may make you more likely to have a fall. Your health care provider may recommend:  Regular vision checks. Poor vision and conditions such as cataracts can make you more likely to have a fall. If you wear glasses, make sure to get your prescription updated if your vision changes.  Medicine review. Work with your health care provider to regularly review all of the medicines you are taking, including over-the-counter medicines. Ask your health care  provider about any side effects that may make you more likely to have a fall. Tell your health care provider if any medicines that you take make you feel dizzy or sleepy.  Osteoporosis screening. Osteoporosis is a condition that causes the bones to get weaker. This can make the bones weak and cause them to break more easily.  Blood pressure screening. Blood pressure changes and medicines to control blood pressure can make you feel dizzy.  Strength and balance checks. Your health care provider may recommend certain tests to check your strength and balance while standing, walking, or changing positions.  Foot health exam. Foot pain and numbness, as well as not wearing proper footwear, can make you more likely to have a fall.  Depression screening. You may be more likely to have a fall if you have a fear of falling, feel emotionally low, or feel unable to do activities that you used to do.  Alcohol use screening. Using too much alcohol can affect your balance and may make you more likely to have a fall. What actions can I take to lower my risk of falls? General instructions  Talk with your health care provider about your risks for falling. Tell your health care provider if: ? You fall. Be sure to tell your health care provider about all falls, even ones that seem minor. ? You feel dizzy, sleepy, or off-balance.  Take over-the-counter and prescription medicines only as told by your health care provider. These include any supplements.  Eat a healthy diet and maintain a healthy weight. A  healthy diet includes low-fat dairy products, low-fat (lean) meats, and fiber from whole grains, beans, and lots of fruits and vegetables. Home safety  Remove any tripping hazards, such as rugs, cords, and clutter.  Install safety equipment such as grab bars in bathrooms and safety rails on stairs.  Keep rooms and walkways well-lit. Activity   Follow a regular exercise program to stay fit. This will help  you maintain your balance. Ask your health care provider what types of exercise are appropriate for you.  If you need a cane or walker, use it as recommended by your health care provider.  Wear supportive shoes that have nonskid soles. Lifestyle  Do not drink alcohol if your health care provider tells you not to drink.  If you drink alcohol, limit how much you have: ? 0-1 drink a day for women. ? 0-2 drinks a day for men.  Be aware of how much alcohol is in your drink. In the [place] one drink equals one typical bottle of beer (12 oz), one-half glass of wine (5 oz), or one shot of hard liquor (1 oz).  Do not use any products that contain nicotine or tobacco, such as cigarettes and e-cigarettes. If you need help quitting, ask your health care provider. Summary  Having a healthy lifestyle and getting preventive care can help to protect your health and wellness after age 41.  Screening and testing are the best way to find a health problem early and help you avoid having a fall. Early diagnosis and treatment give you the best chance for managing medical conditions that are more common for people who are older than age 49.  Falls are a major cause of broken bones and head injuries in people who are older than age 50. Take precautions to prevent a fall at home.  Work with your health care provider to learn what changes you can make to improve your health and wellness and to prevent falls. This information is not intended to replace advice given to you by your health care provider. Make sure you discuss any questions you have with your health care provider. Document Revised: 03/06/2019 Document Reviewed: 09/26/2017 Elsevier Patient Education  2020 [employer].

## 2020-10-27 NOTE — Assessment & Plan Note (Signed)
Preventative protocols reviewed and updated unless pt declined. Discussed healthy diet and lifestyle.  

## 2020-10-27 NOTE — Progress Notes (Signed)
Patient ID: [name], female    DOB: [birth year], 74 y.o.   MRN: [id number]  This visit was conducted in person.  BP 126/80 (BP Location: Left Arm, Patient Position: Sitting, Cuff Size: Normal)   Pulse 78   Temp (!) 97.5 F (36.4 C) (Temporal)   Ht 5\' 4"  (1.626 m)   Wt 166 lb (75.3 kg)   SpO2 98%   BMI 28.49 kg/m    CC: AMW/CPE Subjective:   HPI: [name] is a 74 y.o. female presenting on 10/27/2020 for Medicare Wellness   Did not see health advisor this year.    Hearing Screening   125Hz  250Hz  500Hz  1000Hz  2000Hz  3000Hz  4000Hz  6000Hz  8000Hz   Right ear:   40 40 20  20    Left ear:   25 40 20  20    Vision Screening Comments: Last eye exam, 09/2020.    Office Visit from 10/27/2020 in [place] [facility] [place]  PHQ-2 Total Score 0      Fall Risk  10/27/2020 09/29/2020 12/24/2019 10/13/2019 07/15/2019  Falls in the past year? 0 0 1 1 0  Comment - - - blood pressure dropped -  Number falls in past yr: - 0 0 1 0  Injury with Fall? - 0 0 0 0  Risk for fall due to : - - - Medication side effect;Impaired mobility -  Follow up - - - Falls evaluation completed;Falls prevention discussed -   Saw Dr [name] last month, discussing DBS. Prescribed lift chair, rec walker use regularly. She did not bring walker today. Unable to tolerate amantadine due to hallucinations.  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  Continues B12 shots monthly.   Preventative: COLONOSCOPY Date: 06/2008 h/o polyps but latest WNL, rec rpt 10 yrs Olevia Perches).Upcoming appt with GI. COLONOSCOPY 11/2018 multiple TAs (10 polyps total), rpt 2 yrs [facility] Plan) Mammogram12/2020 Birads1 @ [facility] - s/p hysterectomyandoophorectomy.  DEXA Date: 04/2013 T -2.9atfemur, -1.6 atspine.Last reclast8/2015.04/2017 DEXA T score -2.9 hip  DEXA 10/2019 - T -2.8 hip continue Q23mo prolia  (last 06/2020).continues cal/vit D regularly.  [place] 12/2019, 01/2020, discussed booster  Prevnar 04/2014, pneumovax6/2016  Tdap 2012  Zostavax 2011  shingrix - discussed  Advanced directives: [name] and Robby sons would be HCPOA. Has packet at home - continues working on this.Would be ok with CPR and intubation as well as feeding tube, but doesn't want prolonged life support if terminal condition.  Seat belt use discussed. Sunscreen use discussed. No changing moles on skin. Non smoker Alcohol - none Dentist - has dentures Eye exam yearly (more frequently recently) Bowel - ongoing constipation despite miralax 1 capful twice daily. Last BM 3 d ago.  Bladder - + incontinence followed by Dr Karsten Ro Edyth Gunnels - ran out)   Lives with daughter, 1 dog. Widow of husband [name]) 2015. 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     Relevant past medical, surgical, family and social history reviewed and updated as indicated. Interim medical history since our last visit reviewed. Allergies and medications reviewed and updated. Outpatient Medications Prior to Visit  Medication Sig Dispense Refill  . AMBULATORY NON FORMULARY MEDICATION Lift chair Dx:  G20 1 Device 0  .  Artificial Tear Ointment (DRY EYES OP) Place 1 application into the right eye 4 (four) times daily.     . Calcium Carbonate-Vitamin D (CALCIUM 600/VITAMIN D) 600-400 MG-UNIT chew tablet Chew 1 tablet by mouth daily.    . carbidopa-levodopa (SINEMET CR) 50-200 MG tablet TAKE 1 TABLET BY MOUTH AT  BEDTIME 60 tablet 0  . carbidopa-levodopa (SINEMET IR) 25-100 MG tablet 2 tablet at 6am/ and 1 tablet 9:30am/1pm/4:30pm.  May take an extra 1/2 tablet if needed in the evening 360 tablet 1  . Cholecalciferol (VITAMIN D3) 25  MCG (1000 UT) CAPS Take 1 capsule (1,000 Units total) by mouth daily. 30 capsule   . cyanocobalamin (,VITAMIN B-12,) 1000 MCG/ML injection INJECT 1 ML (1,000 MCG TOTAL) INTO THE MUSCLE EVERY 30 (THIRTY) DAYS. 3 mL 2  . gabapentin (NEURONTIN) 100 MG capsule Take 2 capsules (200 mg total) by mouth at bedtime. 180 capsule 3  . polyethylene glycol (MIRALAX / GLYCOLAX) packet Take 17 g by mouth as needed. 30 each 1  . pramipexole (MIRAPEX) 0.5 MG tablet TAKE 1 TABLET BY MOUTH 3  TIMES DAILY 270 tablet 0  . sertraline (ZOLOFT) 100 MG tablet Take 1 tablet (100 mg total) by mouth daily. 90 tablet 1  . triamcinolone cream (KENALOG) 0.1 % APPLY TO AFFECTED AREA TWICE A DAY 45 g 1  . clonazePAM (KLONOPIN) 0.5 MG tablet TAKE 1 TABLET(0.5 MG) BY MOUTH TWICE DAILY AS NEEDED 60 tablet 0  . traMADol (ULTRAM) 50 MG tablet Take 1 tablet (50 mg total) by mouth 3 (three) times daily as needed. 50 tablet 0   No facility-administered medications prior to visit.     Per HPI unless specifically indicated in ROS section below Review of Systems  Constitutional: Negative for activity change, appetite change, chills, fatigue, fever and unexpected weight change.  HENT: Negative for hearing loss.   Eyes: Positive for visual disturbance.  Respiratory: Positive for cough (due to choking on saliva). Negative for chest tightness, shortness of breath and wheezing.   Cardiovascular: Negative for chest pain, palpitations and leg swelling.  Gastrointestinal: Positive for constipation. Negative for abdominal distention, abdominal pain, blood in stool, diarrhea, nausea and vomiting.  Genitourinary: Negative for difficulty urinating and hematuria.  Musculoskeletal: Negative for arthralgias, myalgias and neck pain.  Skin: Negative for rash.  Neurological: Positive for headaches. Negative for dizziness, seizures and syncope.  Hematological: Negative for adenopathy. Does not bruise/bleed easily.  Psychiatric/Behavioral: Negative for  dysphoric mood. The patient is not nervous/anxious.    Objective:  BP 126/80 (BP Location: Left Arm, Patient Position: Sitting, Cuff Size: Normal)   Pulse 78   Temp (!) 97.5 F (36.4 C) (Temporal)   Ht 5\' 4"  (1.626 m)   Wt 166 lb (75.3 kg)   SpO2 98%   BMI 28.49 kg/m   Wt Readings from Last 3 Encounters:  10/27/20 166 lb (75.3 kg)  09/29/20 166 lb (75.3 kg)  07/19/20 166 lb (75.3 kg)    Ht Readings from Last 3 Encounters:  10/27/20 5\' 4"  (1.626 m)  09/29/20 5\' 5"  (1.651 m)  07/19/20 5\' 5"  (1.651 m)      Physical Exam Vitals and nursing note reviewed.  Constitutional:      General: She is not in acute distress.    Appearance: Normal appearance. She is well-developed. She is not ill-appearing.  HENT:     Head: Normocephalic and atraumatic.     Right Ear: Hearing, tympanic membrane, ear canal and external ear normal.  Left Ear: Hearing, tympanic membrane, ear canal and external ear normal.  Eyes:     General: No scleral icterus.    Extraocular Movements: Extraocular movements intact.     Conjunctiva/sclera: Conjunctivae normal.     Pupils: Pupils are equal, round, and reactive to light.  Neck:     Thyroid: No thyroid mass or thyromegaly.     Vascular: No carotid bruit.  Cardiovascular:     Rate and Rhythm: Normal rate and regular rhythm.     Pulses: Normal pulses.          Radial pulses are 2+ on the right side and 2+ on the left side.     Heart sounds: Normal heart sounds. No murmur heard.   Pulmonary:     Effort: Pulmonary effort is normal. No respiratory distress.     Breath sounds: Normal breath sounds. No wheezing, rhonchi or rales.  Abdominal:     General: Abdomen is flat. Bowel sounds are normal. There is no distension.     Palpations: Abdomen is soft. There is no mass.     Tenderness: There is no abdominal tenderness. There is no guarding or rebound.     Hernia: No hernia is present.  Musculoskeletal:        General: Normal range of motion.     Cervical  back: Normal range of motion and neck supple.     Right lower leg: No edema.     Left lower leg: No edema.  Lymphadenopathy:     Cervical: No cervical adenopathy.  Skin:    General: Skin is warm and dry.     Findings: No rash.  Neurological:     Mental Status: She is alert and oriented to person, place, and time.     Motor: Tremor (resting R>L hands) present.     Comments:  CN grossly intact, station and gait intact Recall 3/3  Calculation 4/5 DOLROW  Psychiatric:        Mood and Affect: Mood normal.        Behavior: Behavior normal.        Thought Content: Thought content normal.        Judgment: Judgment normal.       Results for orders placed or performed in visit on 10/25/20  CBC with Differential/Platelet  Result Value Ref Range   WBC 3.8 (L) 4.0 - 10.5 K/uL   RBC 3.88 3.87 - 5.11 Mil/uL   Hemoglobin 11.2 (L) 12.0 - 15.0 g/dL   HCT 33.8 (L) 36 - 46 %   MCV 87.2 78.0 - 100.0 fl   MCHC 33.0 30.0 - 36.0 g/dL   RDW 15.4 11.5 - 15.5 %   Platelets 248.0 150 - 400 K/uL   Neutrophils Relative % 49.3 43 - 77 %   Lymphocytes Relative 38.8 12 - 46 %   Monocytes Relative 8.1 3 - 12 %   Eosinophils Relative 3.3 0 - 5 %   Basophils Relative 0.5 0 - 3 %   Neutro Abs 1.9 1.4 - 7.7 K/uL   Lymphs Abs 1.5 0.7 - 4.0 K/uL   Monocytes Absolute 0.3 0.1 - 1.0 K/uL   Eosinophils Absolute 0.1 0.0 - 0.7 K/uL   Basophils Absolute 0.0 0.0 - 0.1 K/uL  VITAMIN D 25 Hydroxy (Vit-D Deficiency, Fractures)  Result Value Ref Range   VITD 32.80 30.00 - 100.00 ng/mL  Vitamin B12  Result Value Ref Range   Vitamin B-12 311 211 - 911 pg/mL  Comprehensive metabolic  panel  Result Value Ref Range   Sodium 139 135 - 145 mEq/L   Potassium 4.0 3.5 - 5.1 mEq/L   Chloride 103 96 - 112 mEq/L   CO2 29 19 - 32 mEq/L   Glucose, Bld 97 70 - 99 mg/dL   BUN 16 6 - 23 mg/dL   Creatinine, Ser 0.62 0.40 - 1.20 mg/dL   Total Bilirubin 0.4 0.2 - 1.2 mg/dL   Alkaline Phosphatase 54 39 - 117 U/L   AST 13 0 - 37  U/L   ALT 3 0 - 35 U/L   Total Protein 7.0 6.0 - 8.3 g/dL   Albumin 4.1 3.5 - 5.2 g/dL   GFR 89.02 >60.00 mL/min   Calcium 9.2 8.4 - 10.5 mg/dL  Lipid panel  Result Value Ref Range   Cholesterol 193 0 - 200 mg/dL   Triglycerides 132.0 0 - 149 mg/dL   HDL 53.50 >39.00 mg/dL   VLDL 26.4 0.0 - 40.0 mg/dL   LDL Cholesterol 113 (H) 0 - 99 mg/dL   Total CHOL/HDL Ratio 4    NonHDL 139.87    Depression screen [facility] 2/9 10/27/2020 10/13/2019 10/04/2018 09/18/2017 11/14/2016  Decreased Interest 0 0 0 1 0  Down, Depressed, Hopeless 0 0 0 1 0  PHQ - 2 Score 0 0 0 2 0  Altered sleeping 2 0 2 2 -  Tired, decreased energy 1 0 0 1 -  Change in appetite 2 0 1 2 -  Feeling bad or failure about yourself  1 0 0 0 -  Trouble concentrating 1 0 0 0 -  Moving slowly or fidgety/restless 2 0 0 0 -  Suicidal thoughts 0 0 0 0 -  PHQ-9 Score 9 0 3 7 -  Difficult doing work/chores - Not difficult at all Not difficult at all Not difficult at all -  Some recent data might be hidden    GAD 7 : Generalized Anxiety Score 10/27/2020  Nervous, Anxious, on Edge 1  Control/stop worrying 1  Worry too much - different things 1  Trouble relaxing 1  Restless 1  Easily annoyed or irritable 1  Afraid - awful might happen 0  Total GAD 7 Score 6   Assessment & Plan:  This visit occurred during the SARS-CoV-2 public health emergency.  Safety protocols were in place, including screening questions prior to the visit, additional usage of staff PPE, and extensive cleaning of exam room while observing appropriate contact time as indicated for disinfecting solutions.   Problem List Items Addressed This Visit    Vitamin D deficiency    Continue oral vit D replacement.       Vitamin B12 deficiency    Continue b12 shots - administered today.       Parkinson's disease [facility])    [facility]y care.  Progressive resting tremor noted.  Discussing DBS with neurology - she is leaning towards undergoing.       Relevant  Medications   clonazePAM (KLONOPIN) 0.5 MG tablet   Paralytic lagophthalmos of right eye    See above. Discussing plastic surgery      Osteoporosis    Continue monthly prolia, last received 06/2020. Continue calcium and vitamin D.       Orthostatic hypotension due to Parkinson's disease ([place])    Now off northera.       Relevant Medications   clonazePAM (KLONOPIN) 0.5 MG tablet   Medicare annual wellness visit, subsequent - Primary    I have personally  reviewed the Medicare Annual Wellness questionnaire and have noted 1. The patient's medical and social history 2. Their use of alcohol, tobacco or illicit drugs 3. Their current medications and supplements 4. The patient's functional ability including ADL's, fall risks, home safety risks and hearing or visual impairment. Cognitive function has been assessed and addressed as indicated.  5. Diet and physical activity 6. Evidence for depression or mood disorders The patients weight, height, BMI have been recorded in the chart. I have made referrals, counseling and provided education to the patient based on review of the above and I have provided the pt with a written personalized care plan for preventive services. Provider list updated.. See scanned questionairre as needed for further documentation. Reviewed preventative protocols and updated unless pt declined.       MDD (major depressive disorder), single episode, moderate (HCC)    Stable period on sertraline 100mg  daily.       HLD (hyperlipidemia)    Chronic, stable off meds. The 10-year ASCVD risk score Mikey Bussing DC Brooke Bonito., et al., 2013) is: 11.3%   Values used to calculate the score:     Age: 40 years     Sex: Female     Is Non-Hispanic African American: No     Diabetic: No     Tobacco smoker: No     Systolic Blood Pressure: 794 mmHg     Is BP treated: No     HDL Cholesterol: 53.5 mg/dL     Total Cholesterol: 193 mg/dL       Herpes simplex keratitis of right eye     Discussing plastic surgery for ptosis due to this. Regularly sees eye doctor.       Health maintenance examination    Preventative protocols reviewed and updated unless pt declined. Discussed healthy diet and lifestyle.       GAD (generalized anxiety disorder)    Continues sertraline and klonopin.       Constipation    Worsening despite miralax, concern it is causing cramping.  rec continue miralax 1 capful daily, hold if not tolerated.  Discussed fiber in diet - rec kiwi and prunes. rec increase water intake. Consider add on senna vs milk of magnesia if above ineffective.       Chronic insomnia    Continues clonazepam at night.        Anemia    Persistent mild normocytic anemia. Kidney function remains normal.  B12 remains borderline low. Will continue replacement.       Advanced care planning/counseling discussion    Advanced directives: [name] and Robby sons would be HCPOA. Has packet at home - continues working on this.Would be ok with CPR and intubation as well as feeding tube, but doesn't want prolonged life support if terminal condition.           Meds ordered this encounter  Medications  . cyanocobalamin ((VITAMIN B-12)) injection 1,000 mcg  . clonazePAM (KLONOPIN) 0.5 MG tablet    Sig: TAKE 1 TABLET(0.5 MG) BY MOUTH TWICE DAILY AS NEEDED    Dispense:  60 tablet    Refill:  0  . traMADol (ULTRAM) 50 MG tablet    Sig: Take 1 tablet (50 mg total) by mouth 3 (three) times daily as needed.    Dispense:  50 tablet    Refill:  0    Not to exceed 4 additional fills before 09/08/2020   No orders of the defined types were placed in this encounter.   Patient instructions:  Call Dr Karsten Ro for myrbetriq refill.  You will be due for repeat colonoscopy next month.  Consider covid booster.  If interested, check with pharmacy about new 2 shot shingles series (shingrix).  Increase fiber in the diet including regular prunes and kiwi. Increase water as well. Continue  miralax 1 capful daily, hold if cramping. Let us know if increased fiber and water doesn't help to consider additional medicine.  Bring [place] copy of advanced directives when completed.  Return as needed or in 6 months for follow up visit.   Follow up plan: Return in about 6 months (around 04/27/2021) for follow up visit.  [name], MD

## 2020-10-27 NOTE — Assessment & Plan Note (Addendum)
Worsening despite miralax, concern it is causing cramping.  rec continue miralax 1 capful daily, hold if not tolerated.  Discussed fiber in diet - rec kiwi and prunes. rec increase water intake. Consider add on senna vs milk of magnesia if above ineffective.

## 2020-10-28 NOTE — Assessment & Plan Note (Signed)
Discussing plastic surgery for ptosis due to this. Regularly sees eye doctor.

## 2020-10-28 NOTE — Assessment & Plan Note (Signed)
Continue monthly prolia, last received 06/2020. Continue calcium and vitamin D.

## 2020-10-28 NOTE — Assessment & Plan Note (Signed)
Stable period on sertraline 100mg daily.  

## 2020-10-28 NOTE — Assessment & Plan Note (Signed)
Chronic, stable off meds. The 10-year ASCVD risk score Mikey Bussing DC Brooke Bonito., et al., 2013) is: 11.3%   Values used to calculate the score:     Age: 74 years     Sex: Female     Is Non-Hispanic African American: No     Diabetic: No     Tobacco smoker: No     Systolic Blood Pressure: 062 mmHg     Is BP treated: No     HDL Cholesterol: 53.5 mg/dL     Total Cholesterol: 193 mg/dL

## 2020-10-28 NOTE — Assessment & Plan Note (Signed)
Continue oral vit D replacement.

## 2020-10-28 NOTE — Assessment & Plan Note (Signed)
See above. Discussing plastic surgery

## 2020-10-28 NOTE — Assessment & Plan Note (Signed)
Appreciate neurology care.  Progressive resting tremor noted.  Discussing DBS with neurology - she is leaning towards undergoing.

## 2020-10-28 NOTE — Assessment & Plan Note (Signed)
Continues sertraline and klonopin.

## 2020-10-28 NOTE — Assessment & Plan Note (Signed)
Now off northera.

## 2020-10-28 NOTE — Assessment & Plan Note (Signed)
Persistent mild normocytic anemia. Kidney function remains normal.  B12 remains borderline low. Will continue replacement.

## 2020-10-28 NOTE — Assessment & Plan Note (Addendum)
Continue b12 shots - administered today.

## 2020-10-28 NOTE — Assessment & Plan Note (Signed)
Continues clonazepam at night.

## 2020-11-01 DIAGNOSIS — M48061 Spinal stenosis, lumbar region without neurogenic claudication: Secondary | ICD-10-CM | POA: Diagnosis not present

## 2020-11-01 DIAGNOSIS — M5416 Radiculopathy, lumbar region: Secondary | ICD-10-CM | POA: Diagnosis not present

## 2020-11-01 DIAGNOSIS — M545 Low back pain, unspecified: Secondary | ICD-10-CM | POA: Diagnosis not present

## 2020-11-01 DIAGNOSIS — M4126 Other idiopathic scoliosis, lumbar region: Secondary | ICD-10-CM | POA: Diagnosis not present

## 2020-11-01 DIAGNOSIS — G20 Parkinson's disease: Secondary | ICD-10-CM | POA: Diagnosis not present

## 2020-11-10 ENCOUNTER — Ambulatory Visit: Payer: Medicare Other

## 2020-11-15 ENCOUNTER — Other Ambulatory Visit: Payer: Self-pay | Admitting: Neurology

## 2020-11-15 DIAGNOSIS — H02831 Dermatochalasis of right upper eyelid: Secondary | ICD-10-CM | POA: Diagnosis not present

## 2020-11-15 DIAGNOSIS — H57813 Brow ptosis, bilateral: Secondary | ICD-10-CM | POA: Diagnosis not present

## 2020-11-15 DIAGNOSIS — H02834 Dermatochalasis of left upper eyelid: Secondary | ICD-10-CM | POA: Diagnosis not present

## 2020-11-15 DIAGNOSIS — H02413 Mechanical ptosis of bilateral eyelids: Secondary | ICD-10-CM | POA: Diagnosis not present

## 2020-12-02 ENCOUNTER — Other Ambulatory Visit: Payer: Self-pay | Admitting: Family Medicine

## 2020-12-02 ENCOUNTER — Telehealth: Payer: Self-pay

## 2020-12-02 ENCOUNTER — Telehealth: Payer: Self-pay | Admitting: Neurology

## 2020-12-02 DIAGNOSIS — Z1231 Encounter for screening mammogram for malignant neoplasm of breast: Secondary | ICD-10-CM

## 2020-12-02 MED ORDER — CARBIDOPA-LEVODOPA ER 50-200 MG PO TBCR: 1.0000 | EXTENDED_RELEASE_TABLET | Freq: Every day | ORAL | 0 refills | Status: DC

## 2020-12-02 NOTE — Telephone Encounter (Signed)
Rx(s) sent to pharmacy electronically.  

## 2020-12-02 NOTE — Telephone Encounter (Signed)
Received fax from [facility] requesting refill of Alfuzosin ER, 10mg , QD.  Medication not prescribed by PCP before. No record of who prescribed med before.  Last OV  10/27/20 Next OV   04/27/21

## 2020-12-03 NOTE — Telephone Encounter (Signed)
I think this may be a mistake - would check with pt to see if she's taking this and who prescribed. Recommend request sent to prescribing physician if found.

## 2020-12-03 NOTE — Telephone Encounter (Signed)
Called patient to discuss the info below, left vm for patient to call the office back

## 2020-12-06 ENCOUNTER — Other Ambulatory Visit: Payer: Self-pay

## 2020-12-06 ENCOUNTER — Ambulatory Visit
Admission: RE | Admit: 2020-12-06 | Discharge: 2020-12-06 | Disposition: A | Discharge status: Home / Self Care | Payer: Medicare Other | Source: Ambulatory Visit | Attending: Family Medicine | Admitting: Family Medicine

## 2020-12-06 DIAGNOSIS — Z1231 Encounter for screening mammogram for malignant neoplasm of breast: Secondary | ICD-10-CM

## 2020-12-08 NOTE — Telephone Encounter (Signed)
Lvm asking pt to call back.  Need to clarify if pt is still taking alfuzosin (Uroxatral) and if so, who is prescribing so request can be faxed to them.

## 2020-12-08 NOTE — Telephone Encounter (Signed)
Pt returning call.  States she no longer takes med and it was prescribed by Dr. Karsten Ro.  Has been off med about 6 mos now.  I relayed Dr. Synthia Innocent message.  Pt verbalizes understanding.  FYI to Dr. Darnell Level.

## 2020-12-20 ENCOUNTER — Telehealth (INDEPENDENT_AMBULATORY_CARE_PROVIDER_SITE_OTHER): Payer: Medicare Other | Admitting: Primary Care

## 2020-12-20 ENCOUNTER — Other Ambulatory Visit: Payer: Self-pay

## 2020-12-20 ENCOUNTER — Telehealth: Payer: Self-pay

## 2020-12-20 ENCOUNTER — Other Ambulatory Visit: Payer: Medicare Other

## 2020-12-20 VITALS — Ht 64.0 in | Wt 166.0 lb

## 2020-12-20 DIAGNOSIS — R059 Cough, unspecified: Secondary | ICD-10-CM

## 2020-12-20 MED ORDER — BENZONATATE 200 MG PO CAPS
200.0000 mg | ORAL_CAPSULE | Freq: Three times a day (TID) | ORAL | 0 refills | Status: DC | PRN
Start: 2020-12-20 — End: 2020-12-20

## 2020-12-20 MED ORDER — BENZONATATE 200 MG PO CAPS: 200.0000 mg | ORAL_CAPSULE | Freq: Three times a day (TID) | ORAL | 0 refills | Status: DC | PRN

## 2020-12-20 NOTE — Patient Instructions (Signed)
You may take Benzonatate capsules for cough. Take 1 capsule by mouth three times daily as needed for cough.  Come by later this afternoon for testing.  Please call us if your symptoms do not improve over the next several days. Call your Parkinson's doctor as discussed.   It was a pleasure meeting you! Allie Bossier, NP-

## 2020-12-20 NOTE — Assessment & Plan Note (Signed)
Acute viral symptoms x 3 days, has completed 2 Covid-19 vaccines. She appears stable. Suspect Covid-19 infection, we will retest her this afternoon. Discussed conservative treatment. Rx for [name] sent to pharmacy.  Return/ED precautions provided.

## 2020-12-20 NOTE — Progress Notes (Signed)
Printed and faxed

## 2020-12-20 NOTE — Progress Notes (Signed)
Subjective:    Patient ID: [name], female    DOB: 22-Jan-1946, 75 y.o.   MRN: [id number]  HPI  Virtual Visit via Video Note  I connected with [name] on 12/20/20 at  9:40 AM EST by a video enabled telemedicine application and verified that I am speaking with the correct person using two identifiers.  Location: Patient: Home Provider: Office Participants: Patient and myself   I discussed the limitations of evaluation and management by telemedicine and the availability of in person appointments. The patient expressed understanding and agreed to proceed.  History of Present Illness:  [name] is a 75 year old female patient of Dr. Danise Mina with a history of CAD, Parkinson's Disease, recurrent UTI, fibromyalgia, DDD, MDD who presents today with a chief complaint of sore throat.  She also reports cough, body aches, headaches. Symptoms began about three days ago with scratchy throat, started feeling worse last night and now has additional symptoms. She's completed 2 Covid-19 vaccines. She completed a home Covid-19 test yesterday which was negative. She was exposed to her son three days ago who recently tested positive for Covid-19. He has had 2 vaccinations.   She denies fevers. She's been taking Tylenol and drinking a lot of water over the last few days. She has an appointment with her Parkinson's doctor later this week, she will call them today to discuss her symptoms.    Observations/Objective:  Alert and oriented. Appears tired No distress. Speaking in complete sentences. Dry cough noted once during visit.  Assessment and Plan:  Acute viral symptoms x 3 days, has completed 2 Covid-19 vaccines. She appears stable. Suspect Covid-19 infection, we will retest her this afternoon. Discussed conservative treatment. Rx for Ladona Ridgel sent to pharmacy.  Return/ED precautions provided.   Follow Up Instructions:  You may take Benzonatate capsules for cough. Take 1  capsule by mouth three times daily as needed for cough.  Come by later this afternoon for testing.  Please call us if your symptoms do not improve over the next several days. Call your Parkinson's doctor as discussed.   It was a pleasure meeting you! Allie Bossier, NP-C    I discussed the assessment and treatment plan with the patient. The patient was provided an opportunity to ask questions and all were answered. The patient agreed with the plan and demonstrated an understanding of the instructions.   The patient was advised to call back or seek an in-person evaluation if the symptoms worsen or if the condition fails to improve as anticipated.    Pleas Koch, NP    Review of Systems  Constitutional: Positive for fatigue. Negative for fever.  HENT: Positive for congestion and sore throat.   Respiratory: Positive for cough. Negative for shortness of breath.   Musculoskeletal: Positive for myalgias.  Neurological: Positive for headaches.        Past Medical History:  Diagnosis Date  . Allergy   . ANEMIA-NOS 09/25/2007  . Anxiety   . Arthritis   . B12 DEFICIENCY 05/03/2007  . Blood transfusion without reported diagnosis   . CAD (coronary artery disease) 01/2010   MI, Nishan  . Cardiomyopathy ([place]) 02/08/2010   H/o this 2012 after urosepsis, no recurrence.   . Cataract   . CHF (congestive heart failure) ([place])   . Complication of anesthesia   . Depression   . FIBROMYALGIA 05/03/2007  . GERD 02/22/2010  . Glaucoma 02/2013   [facility]  . History of  CHF (congestive heart failure) 01/2010  . History of colon polyps 03/02/03  . HYPERLIPIDEMIA 12/19/2007  . HYPOTENSION, ORTHOSTATIC 12/06/2008  . Interstitial cystitis    [place] now Dr Amalia Hailey  . Lupus (systemic lupus erythematosus) ([place]) 02/08/2010  . MCTD (mixed connective tissue disease) ([place]) 02/08/2010  . OSTEOPOROSIS 08/2009   bisphosphonate on hold 2/2 dysphagia, on reclast done in August each year  . Parkinson's  disease ([place]) 08/25/2015   Dx Dr Carles Collet 07/2015   . PONV (postoperative nausea and vomiting)   . 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 Takotsubo cardiomyopathy 02-Mar-2007   due to E coli urosepsis     Social History   Socioeconomic History  . Marital status: Widowed    Spouse name: Not on file  . Number of children: 4  . Years of education: Not on file  . Highest education level: 10th grade  Occupational History  . Occupation: retired  Tobacco Use  . Smoking status: Never Smoker  . Smokeless tobacco: Never Used  Vaping Use  . Vaping Use: Never used  Substance and Sexual Activity  . Alcohol use: No  . Drug use: Yes    Types: Nitrous oxide  . Sexual activity: Never  Other Topics Concern  . Not on file  Social History Narrative   Widow - husband [name]) passed away 2013/03/01.     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 ABDOMINAL HYSTERECTOMY  1970s   IUD infection - first partial then with oophorectomy (cysts), complication - low blood pressure  . CATARACT EXTRACTION Bilateral   . CHOLECYSTECTOMY     complication - low blood pressure  . COLONOSCOPY  06/2008   h/o polyps but latest WNL, rec rpt 10 yrs Olevia Perches)  . COLONOSCOPY  11/2018   multiple TAs (10 polyps total), rpt 2 yrs Fuller Plan)  . CYSTOSCOPY  12/2013   abx treatment for recurrent cystitis  . DEXA  04/2013   T -2.9 @ femur, -1.6 @ spine  . DEXA  04/2017   T -2.9 hip, -0.7 spine  . ESOPHAGOGASTRODUODENOSCOPY  12/2017   WNL,  regardless esophagus dilated, small HH ([place])  . LUMBAR LAMINECTOMY/DECOMPRESSION MICRODISCECTOMY Right 11/27/2019   Right Lumbar Four-Five foraminotomy;  Erline Levine, MD)    Family History  Problem Relation Age of Onset  . Heart attack Father   . Diabetes Father   . Prostate cancer Father   . Esophageal cancer Mother   . Lung cancer Brother   . Breast cancer Sister   . Ovarian cancer Sister   . Lung cancer Brother   . CAD Brother   . Uterine cancer Sister   . Clotting disorder Son   . Healthy Son   . Healthy Daughter   . Healthy Daughter   . Colon cancer Neg Hx   . Rectal cancer Neg Hx   . Stomach cancer Neg Hx     Allergies  Allergen Reactions  .  Amitriptyline Other (See Comments)    Sedated next morning  . Ciprofloxacin Nausea And Vomiting  . Cymbalta [Duloxetine Hcl] Other (See Comments)    Worsened depression  . Imipramine Hcl     REACTION: rash  . Iohexol      Code: HIVES, Desc: PT developed 2 hives, followed by SOB, severe headache post 87cc's Omnipaque 300., Onset Date: [id number]   . Lyrica [Pregabalin] Other (See Comments)    Tried during hospitalization - unsure effects but unable to tolerate  . Morphine Sulfate     REACTION: rash  . Sulfamethoxazole     REACTION: rash  . Iodine Rash  . Lidocaine Hcl Rash  . Neosporin [Neomycin-Bacitracin Zn-Polymyx] Rash    Worsened skin breaking out  . Tetracyclines & Related Rash    Current Outpatient Medications on File Prior to Visit  Medication Sig Dispense Refill  . AMBULATORY NON FORMULARY MEDICATION Lift chair Dx:  G20 1 Device 0  . Artificial Tear Ointment (DRY EYES OP) Place 1 application into the right eye 4 (four) times daily.     . Calcium Carbonate-Vitamin D (CALCIUM 600/VITAMIN D) 600-400 MG-UNIT chew tablet Chew 1 tablet by mouth daily.    . carbidopa-levodopa (SINEMET CR) 50-200 MG tablet Take 1 tablet by mouth at bedtime. 90 tablet 0  . carbidopa-levodopa (SINEMET IR) 25-100 MG tablet 2 tablet  at 6am/ and 1 tablet 9:30am/1pm/4:30pm.  May take an extra 1/2 tablet if needed in the evening 360 tablet 1  . Cholecalciferol (VITAMIN D3) 25 MCG (1000 UT) CAPS Take 1 capsule (1,000 Units total) by mouth daily. 30 capsule   . clonazePAM (KLONOPIN) 0.5 MG tablet TAKE 1 TABLET(0.5 MG) BY MOUTH TWICE DAILY AS NEEDED 60 tablet 0  . cyanocobalamin (,VITAMIN B-12,) 1000 MCG/ML injection INJECT 1 ML (1,000 MCG TOTAL) INTO THE MUSCLE EVERY 30 (THIRTY) DAYS. 3 mL 2  . gabapentin (NEURONTIN) 100 MG capsule Take 2 capsules (200 mg total) by mouth at bedtime. 180 capsule 3  . polyethylene glycol (MIRALAX / GLYCOLAX) packet Take 17 g by mouth as needed. 30 each 1  . pramipexole (MIRAPEX) 0.5 MG tablet TAKE 1 TABLET BY MOUTH 3  TIMES DAILY 270 tablet 0  . sertraline (ZOLOFT) 100 MG tablet Take 1 tablet (100 mg total) by mouth daily. 90 tablet 1  . traMADol (ULTRAM) 50 MG tablet Take 1 tablet (50 mg total) by mouth 3 (three) times daily as needed. 50 tablet 0  . triamcinolone cream (KENALOG) 0.1 % APPLY TO AFFECTED AREA TWICE A DAY 45 g 1   No current facility-administered medications on file prior to visit.    Ht 5\' 4"  (1.626 m)   Wt 166 lb (75.3 kg)   BMI 28.49 kg/m    Objective:   Physical Exam Constitutional:      Comments: Appears tired  Pulmonary:     Effort: Pulmonary effort is normal.     Comments: Dry cough noted once during visit Neurological:     Mental Status: She is alert and oriented to person, place, and time.  Psychiatric:        Mood and Affect: Mood normal.            Assessment & Plan:

## 2020-12-20 NOTE — Progress Notes (Signed)
Virtual Visit Via Video   The purpose of this virtual visit is to provide medical care while limiting exposure to the novel coronavirus.    Consent was obtained for video visit:  Yes.   Answered questions that patient had about telehealth interaction:  Yes.   I discussed the limitations, risks, security and privacy concerns of performing an evaluation and management service by telemedicine. I also discussed with the patient that there may be a patient responsible charge related to this service. The patient expressed understanding and agreed to proceed.  Pt location: Home Physician Location: office Name of referring provider:  Eustaquio Boyden, MD I connected with Eddie Dibbles at patients initiation/request on 12/22/2020 at  9:45 AM EST by video enabled telemedicine application and verified that I am speaking with the correct person using two identifiers. Pt MRN:  [id number] Pt DOB:  July 28, 1946 Video Participants:  Eddie Dibbles;   Assessment/Plan:   1.  Parkinsons Disease  -take carbidopa/levodopa 25/100, 2 tablet at 6am/ and 1 tablet 9:30am/1pm/4:30pm.  May take an extra 1/2 tablet if needed in the evening.  Discussed that covid may increase sx's -Continue carbidopa/levodopa 50/200 at bedtime -Continue pramipexole 0.5 mg 3 times per day             -discussed dbs.  Patient is interested.  We have discussed logistics as well as risks and benefits.  We will schedule for levodopa challenge and neurocognitive testing. 2. Dyskinesia -Now off of amantadine because of hallucinations. -Have reduced pramipexole because of significant dyskinesia as well. 3. Neurogenic Orthostatic Hypotension -Did well on Northera. Patient stopped that on her own. We will need to watch that. Denies lightheaded now 4.Depression -On sertraline -Declines counseling 5. b12 deficiency -on injections. 6.  Urinary incontinence -on myrbetriq. She is doing well in that regard 7. Lumbar radiculopathy -Sheis status post lumbar foraminotomy with Dr. Venetia Maxon on November 27, 2019 9. 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  My previous records were reviewed prior to todays visit as well as outside records available to me. Pt denies falls.  Pt denies lightheadedness, near syncope.  No hallucinations.  Mood has been good.  She does think that she has covid - testing is pending.  She had initial vaccines but had not been vaccinated yet.  We had increased her carbidopa/levodopa last visit and she did okay until she got covid. She   Current prescribed movement disorder medications: carbidopa/levodopa 25/100, 2 tablet at 6am/ and 1 tablet 9:30am/1pm/4:30pm (slight increase last visit) Carbidopa/levodopa 50/200 at bedtime Pramipexole 0.5 mg 3 times per day Sertraline, 100 mg daily  PREVIOUS MEDICATIONS: northera (worked well but pt d/c when they quit sending b/c she owed $);amantadine (discontinued because of hallucinations); pramipexole (decreasing because of hallucinations/dyskinesia)  ALLERGIES:   Allergies  Allergen Reactions  . Amitriptyline Other (See Comments)    Sedated next morning  . Ciprofloxacin Nausea And Vomiting  . Cymbalta [Duloxetine Hcl] Other (See Comments)    Worsened depression  . Imipramine Hcl     REACTION: rash  . Iohexol      Code: HIVES, Desc: PT developed 2 hives, followed by SOB, severe headache post 87cc's Omnipaque 300., Onset Date: [id number]   .  Lyrica [Pregabalin] Other (See Comments)    Tried during hospitalization - unsure effects but unable to tolerate  . Morphine Sulfate     REACTION:  rash  . Sulfamethoxazole     REACTION: rash  . Iodine Rash  . Lidocaine Hcl Rash  . Neosporin [Neomycin-Bacitracin Zn-Polymyx] Rash    Worsened skin breaking out  . Tetracyclines & Related Rash    CURRENT MEDICATIONS:  Outpatient Encounter Medications as of 12/22/2020  Medication Sig  . AMBULATORY NON FORMULARY MEDICATION Lift chair Dx:  G20  . Artificial Tear Ointment (DRY EYES OP) Place 1 application into the right eye 4 (four) times daily.   . Calcium Carbonate-Vitamin D (CALCIUM 600/VITAMIN D) 600-400 MG-UNIT chew tablet Chew 1 tablet by mouth daily.  . carbidopa-levodopa (SINEMET CR) 50-200 MG tablet Take 1 tablet by mouth at bedtime.  . carbidopa-levodopa (SINEMET IR) 25-100 MG tablet 2 tablet at 6am/ and 1 tablet 9:30am/1pm/4:30pm.  May take an extra 1/2 tablet if needed in the evening  . Cholecalciferol (VITAMIN D3) 25 MCG (1000 UT) CAPS Take 1 capsule (1,000 Units total) by mouth daily.  . clonazePAM (KLONOPIN) 0.5 MG tablet TAKE 1 TABLET(0.5 MG) BY MOUTH TWICE DAILY AS NEEDED  . cyanocobalamin (,VITAMIN B-12,) 1000 MCG/ML injection INJECT 1 ML (1,000 MCG TOTAL) INTO THE MUSCLE EVERY 30 (THIRTY) DAYS.  Marland Kitchen gabapentin (NEURONTIN) 100 MG capsule Take 2 capsules (200 mg total) by mouth at bedtime.  . polyethylene glycol (MIRALAX / GLYCOLAX) packet Take 17 g by mouth as needed.  . pramipexole (MIRAPEX) 0.5 MG tablet TAKE 1 TABLET BY MOUTH 3  TIMES DAILY  . sertraline (ZOLOFT) 100 MG tablet Take 1 tablet (100 mg total) by mouth daily.  . traMADol (ULTRAM) 50 MG tablet Take 1 tablet (50 mg total) by mouth 3 (three) times daily as needed.  . triamcinolone cream (KENALOG) 0.1 % APPLY TO AFFECTED AREA TWICE A DAY   No facility-administered encounter medications on file as of 12/22/2020.    Objective:   PHYSICAL EXAMINATION:    VITALS:  There were no vitals filed for this visit.  GEN:  The patient appears stated age and is in NAD.  Neurological examination:  Orientation: The  patient is alert and oriented x3. Cranial nerves: There is good facial symmetry. There is facial hypomimia.  The speech is fluent and clear. Soft palate rises symmetrically and there is no tongue deviation. Hearing is intact to conversational tone. Motor: Strength is at least antigravity x 4.   Shoulder shrug is equal and symmetric.  There is no pronator drift.  Movement examination: Tone: unable Abnormal movements: there is RUE tremor Coordination:  There is mild decremation with RAM's Gait and Station: The patient has mild difficulty arising out of a deep-seated chair without the use of the hands. The patient's stride length is decreased with decreased arm swing and RUE tremor.     I have reviewed and interpreted the following labs independently    Chemistry      Component Value Date/Time   NA 139 10/25/2020 1030   K 4.0 10/25/2020 1030   CL 103 10/25/2020 1030   CO2 29 10/25/2020 1030   BUN 16 10/25/2020 1030   CREATININE 0.62 10/25/2020 1030   CREATININE 0.87 10/08/2019 0921      Component Value Date/Time   CALCIUM 9.2 10/25/2020 1030   ALKPHOS 54 10/25/2020 1030   AST 13 10/25/2020 1030   ALT 3 10/25/2020 1030   BILITOT 0.4 10/25/2020 1030  Lab Results  Component Value Date   WBC 3.8 (L) 10/25/2020   HGB 11.2 (L) 10/25/2020   HCT 33.8 (L) 10/25/2020   MCV 87.2 10/25/2020   PLT 248.0 10/25/2020    Lab Results  Component Value Date   TSH 0.49 10/04/2018     Follow up Instructions      -I discussed the assessment and treatment plan with the patient. The patient was provided an opportunity to ask questions and all were answered. The patient agreed with the plan and demonstrated an understanding of the instructions.   The patient was advised to call back or seek an in-person evaluation if the symptoms worsen or if the condition fails to improve as anticipated.    Total time spent on today's visit was , including both face-to-face time and  nonface-to-face time.  Time included that spent on review of records (prior notes available to me/labs/imaging if pertinent), discussing treatment and goals, answering patient's questions and coordinating care.   Kerin Salen, DO   Cc:  Eustaquio Boyden, MD

## 2020-12-20 NOTE — Telephone Encounter (Signed)
[facility] - Client Nonclinical Telephone Record  [facility] Client [facility] - Client Client Site [place] Physician [name] - MD Contact Type Call Who Is Calling Patient / Member / Family / Caregiver Caller Name Denishia B. Caller Phone Number [phone] Patient Name Elina Streng Patient DOB 06/26/1938 Call Type Message Only Information Provided Reason for Call Request for General Office Information Initial Comment Caller states Dan Dissinger her son tested positive for covid and thinks she may have been exposed. She's been coughing a bit and has a virtual appointment at 940 today. Disp. Time Disposition Final User 12/20/2020 8:19:05 AM General Information Provided Yes Luiz Iron Call Closed By: Luiz Iron Transaction Date/Time: 12/20/2020 8:15:32 AM (ET)    Pt had VV with Anda Kraft this morning.

## 2020-12-22 ENCOUNTER — Encounter: Payer: Self-pay | Admitting: Neurology

## 2020-12-22 ENCOUNTER — Other Ambulatory Visit: Payer: Self-pay

## 2020-12-22 ENCOUNTER — Telehealth (INDEPENDENT_AMBULATORY_CARE_PROVIDER_SITE_OTHER): Payer: Medicare Other | Admitting: Neurology

## 2020-12-22 VITALS — Ht 64.0 in | Wt 164.0 lb

## 2020-12-22 DIAGNOSIS — G20 Parkinson's disease: Secondary | ICD-10-CM | POA: Diagnosis not present

## 2020-12-22 DIAGNOSIS — R413 Other amnesia: Secondary | ICD-10-CM

## 2020-12-22 LAB — NOVEL CORONAVIRUS, NAA: SARS-CoV-2, NAA: DETECTED — AB

## 2020-12-22 LAB — SARS-COV-2, NAA 2 DAY TAT

## 2020-12-22 MED ORDER — PRAMIPEXOLE DIHYDROCHLORIDE 0.5 MG PO TABS: 0.5000 mg | ORAL_TABLET | Freq: Three times a day (TID) | ORAL | 1 refills | Status: DC

## 2020-12-22 MED ORDER — CARBIDOPA-LEVODOPA 25-100 MG PO TABS: ORAL_TABLET | ORAL | 1 refills | Status: DC

## 2020-12-23 ENCOUNTER — Telehealth: Payer: Self-pay

## 2020-12-23 DIAGNOSIS — U071 COVID-19: Secondary | ICD-10-CM

## 2020-12-23 NOTE — Telephone Encounter (Signed)
Patient called asking about her COVID result. It is finalized but no documentation has been made from the provider. Patient advised that she will get a call back with feedback and results. CB: [phone].

## 2020-12-23 NOTE — Telephone Encounter (Signed)
She tested positive for covid.  Mychart message sent. Referred to outpatient COVID treatment team.  Please get update on symptoms.

## 2020-12-24 NOTE — Telephone Encounter (Signed)
1st day of symptoms ~12/18/2020

## 2020-12-24 NOTE — Telephone Encounter (Signed)
Spoke with pt asking for an update.  States she is feeling better.  Still c/o runny nose and mild cough but improving.

## 2021-01-04 ENCOUNTER — Telehealth: Payer: Self-pay | Admitting: Family Medicine

## 2021-01-04 DIAGNOSIS — M810 Age-related osteoporosis without current pathological fracture: Secondary | ICD-10-CM

## 2021-01-04 NOTE — Telephone Encounter (Signed)
Pt called wanted to know about getting her prolia shot

## 2021-01-05 ENCOUNTER — Other Ambulatory Visit: Payer: Self-pay

## 2021-01-05 MED ORDER — SERTRALINE HCL 100 MG PO TABS: 100.0000 mg | ORAL_TABLET | Freq: Every day | ORAL | 1 refills | Status: DC

## 2021-01-07 ENCOUNTER — Other Ambulatory Visit: Payer: Self-pay

## 2021-01-07 NOTE — Telephone Encounter (Signed)
Name of Medication: Tramadol Name of Pharmacy: [facility] Last Fill or Written Date and Quantity: 10/27/20, #50 Last Office Visit and Type: 10/27/20, AWV Next Office Visit and Type: 04/27/21, 6 mo f/u Last Controlled Substance Agreement Date: 04/28/16 Last UDS: 04/28/16

## 2021-01-10 MED ORDER — TRAMADOL HCL 50 MG PO TABS: 50.0000 mg | ORAL_TABLET | Freq: Three times a day (TID) | ORAL | 0 refills | Status: DC | PRN

## 2021-01-10 NOTE — Telephone Encounter (Signed)
ERx 

## 2021-01-11 NOTE — Telephone Encounter (Signed)
Last prolia on 07/06/20.  Next prolia due after 01/07/21.  LVM

## 2021-01-12 ENCOUNTER — Encounter: Payer: Self-pay | Admitting: Family Medicine

## 2021-01-12 ENCOUNTER — Ambulatory Visit: Payer: Medicare Other

## 2021-01-12 ENCOUNTER — Other Ambulatory Visit: Payer: Self-pay

## 2021-01-12 ENCOUNTER — Ambulatory Visit (INDEPENDENT_AMBULATORY_CARE_PROVIDER_SITE_OTHER): Payer: Medicare Other | Admitting: Family Medicine

## 2021-01-12 VITALS — BP 100/60 | HR 70 | Temp 97.2°F | Ht 64.0 in | Wt 164.5 lb

## 2021-01-12 DIAGNOSIS — G20 Parkinson's disease: Secondary | ICD-10-CM

## 2021-01-12 DIAGNOSIS — M7502 Adhesive capsulitis of left shoulder: Secondary | ICD-10-CM

## 2021-01-12 DIAGNOSIS — E538 Deficiency of other specified B group vitamins: Secondary | ICD-10-CM

## 2021-01-12 MED ORDER — TRIAMCINOLONE ACETONIDE 40 MG/ML IJ SUSP
40.0000 mg | Freq: Once | INTRAMUSCULAR | Status: AC
Start: 2021-01-12 — End: 2021-01-12
  Administered 2021-01-12: 40 mg via INTRA_ARTICULAR

## 2021-01-12 MED ORDER — CYANOCOBALAMIN 1000 MCG/ML IJ SOLN
1000.0000 ug | Freq: Once | INTRAMUSCULAR | Status: AC
  Administered 2021-01-12: 1000 ug via INTRAMUSCULAR

## 2021-01-12 NOTE — Telephone Encounter (Signed)
Benefits have been submitted.

## 2021-01-12 NOTE — Progress Notes (Signed)
Stevens Magwood T. Jnaya Butrick, MD, [place]  [facility] and [place] at [facility] [place], [address]  Phone: [phone]   FAX: [place] - 75 y.o. female   MRN [id number]   Date of Birth: 02/24/1946  Date: 01/12/2021   PCP: [name], MD   Referral: [name], MD  Chief Complaint  Patient presents with   Shoulder Pain    Left    This visit occurred during the SARS-CoV-2 public health emergency.  Safety protocols were in place, including screening questions prior to the visit, additional usage of staff PPE, and extensive cleaning of exam room while observing appropriate contact time as indicated for disinfecting solutions.   Subjective:   [name] is a 75 y.o. very pleasant female patient with Body mass index is 28.24 kg/m. who presents with the following:  L shoulder pain: She presents with some left-sided shoulder pain, frozen shoulder, without significant osteoarthritis.  Her loss of motion has progressed since last time I saw her, and she is having pain a lot of the time with her shoulder.  She continues to be plagued by Parkinson's, and she is having a work-up for deep brain stimulation with Dr. Carles Collet few weeks from now.  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 Eye surgeries.   Review of Systems is noted in the HPI, as appropriate   Objective:   BP 100/60    Pulse 70    Temp (!) 97.2 F (36.2 C) (Temporal)    Ht 5\' 4"  (1.626 m)    Wt 164 lb 8 oz (74.6 kg)    SpO2 98%    BMI 28.24 kg/m   Left shoulder: She has marked decrease in motion, worsened compared to my prior exam.  She is not able to achieve 90 degrees in any direction, flexion and abduction is approximately 70 degrees.  Unable to do any rotational movement.  Strength testing as able based on location of the upper extremities is grossly 4+ to 5/5.  Radiology: CLINICAL DATA:   Pain  EXAM: LEFT SHOULDER - 2+ VIEW  COMPARISON:  None.  FINDINGS: Oblique, Y scapular, and axillary images were obtained. No fracture or dislocation. The joint spaces appear unremarkable. No erosive change or intra-articular calcification. Visualized left clear.  IMPRESSION: No fracture or dislocation.  No appreciable arthropathy.   Electronically Signed   By: Lowella Grip III M.D.   On: 07/19/2020 12:22  The radiological images were independently reviewed by myself in the office and results were reviewed with the patient. My independent interpretation of images: There is no significant glenohumeral osteoarthritis.  Minimal to no acromioclavicular osteoarthritis. Electronically Signed  By: Owens Loffler, MD On: 01/12/2021  3:20 PM EST   Assessment and Plan:     ICD-10-CM   1. Adhesive capsulitis of left shoulder  M75.02 triamcinolone acetonide (KENALOG-40) injection 40 mg  2. Vitamin B12 deficiency  E53.8 cyanocobalamin ((VITAMIN B-12)) injection 1,000 mcg  3. Parkinson's disease ([place])  G20    This is a very challenging case, and Parkinson's disease almost certainly contributing.  Having adhesive capsulitis for an extended period of time.  Hopefully her shoulder will thaw on its own.  I am going to try therapeutic intra-articular injection today to see if we can provide her some relief.  This may increase her range of motion.  Intra-articular space was easily entered, and good anesthesia after  injection.  I will don't how she could possibly consider manipulation under anesthesia or arthroscopy, and she has no desire.  Intraarticular Shoulder Aspiration/Injection Procedure Note [name] 08-30-1946 Date of procedure: 01/12/2021  Procedure: Large Joint Aspiration / Injection of Shoulder, Intraarticular, L Indications: Pain  Procedure Details Verbal consent was obtained from the patient. Risks explained and contrasted with benefits and alternatives. Patient  prepped with Chloraprep and Ethyl Chloride used for anesthesia. An intraarticular shoulder injection was performed using the posterior approach; needle placed into joint capsule without difficulty. The patient tolerated the procedure well and had decreased pain post injection. No complications. Injection: 9 cc of Lidocaine 1% and 1 mL Kenalog 40 mg. Needle: 21 gauge, 2 inch   Meds ordered this encounter  Medications   cyanocobalamin ((VITAMIN B-12)) injection 1,000 mcg   triamcinolone acetonide (KENALOG-40) injection 40 mg   There are no discontinued medications. No orders of the defined types were placed in this encounter.   Follow-up: No follow-ups on file.  Signed,  Maud Deed. Roseanne Juenger, MD   Outpatient Encounter Medications as of 01/12/2021  Medication Sig   AMBULATORY NON FORMULARY MEDICATION Lift chair Dx:  G20   benzonatate (TESSALON) 200 MG capsule Take 1 capsule (200 mg total) by mouth 3 (three) times daily as needed for cough.   Calcium Carbonate-Vitamin D (CALCIUM 600/VITAMIN D) 600-400 MG-UNIT chew tablet Chew 1 tablet by mouth daily.   carbidopa-levodopa (SINEMET CR) 50-200 MG tablet Take 1 tablet by mouth at bedtime.   carbidopa-levodopa (SINEMET IR) 25-100 MG tablet 2 tablet at 6am/1 tablet at 9:30am/1pm/4:30pm.  May take an extra 1/2 tablet prn evening   Cholecalciferol (VITAMIN D3) 25 MCG (1000 UT) CAPS Take 1 capsule (1,000 Units total) by mouth daily.   clonazePAM (KLONOPIN) 0.5 MG tablet TAKE 1 TABLET(0.5 MG) BY MOUTH TWICE DAILY AS NEEDED   cyanocobalamin (,VITAMIN B-12,) 1000 MCG/ML injection INJECT 1 ML (1,000 MCG TOTAL) INTO THE MUSCLE EVERY 30 (THIRTY) DAYS.   gabapentin (NEURONTIN) 100 MG capsule Take 2 capsules (200 mg total) by mouth at bedtime.   polyethylene glycol (MIRALAX / GLYCOLAX) packet Take 17 g by mouth as needed.   pramipexole (MIRAPEX) 0.5 MG tablet Take 1 tablet (0.5 mg total) by mouth 3 (three) times daily.   sertraline (ZOLOFT)  100 MG tablet Take 1 tablet (100 mg total) by mouth daily.   traMADol (ULTRAM) 50 MG tablet Take 1 tablet (50 mg total) by mouth 3 (three) times daily as needed.   triamcinolone cream (KENALOG) 0.1 % APPLY TO AFFECTED AREA TWICE A DAY   [EXPIRED] cyanocobalamin ((VITAMIN B-12)) injection 1,000 mcg    [EXPIRED] triamcinolone acetonide (KENALOG-40) injection 40 mg    No facility-administered encounter medications on file as of 01/12/2021.

## 2021-01-13 ENCOUNTER — Encounter: Payer: Self-pay | Admitting: Family Medicine

## 2021-01-17 ENCOUNTER — Other Ambulatory Visit: Payer: Self-pay | Admitting: Family Medicine

## 2021-01-17 NOTE — Telephone Encounter (Signed)
Pharmacy requests refill on: Gabapentin 100 mg   LAST REFILL: 03/11/2020 (Q-180, R-3) LAST OV: 10/27/2020 NEXT OV: 04/27/2021 PHARMACY: [facility]   Earliest Fill Date: 03/11/2021

## 2021-01-18 DIAGNOSIS — Z83511 Family history of glaucoma: Secondary | ICD-10-CM | POA: Diagnosis not present

## 2021-01-18 DIAGNOSIS — H40013 Open angle with borderline findings, low risk, bilateral: Secondary | ICD-10-CM | POA: Diagnosis not present

## 2021-01-18 DIAGNOSIS — H18893 Other specified disorders of cornea, bilateral: Secondary | ICD-10-CM | POA: Diagnosis not present

## 2021-01-18 DIAGNOSIS — H401131 Primary open-angle glaucoma, bilateral, mild stage: Secondary | ICD-10-CM | POA: Diagnosis not present

## 2021-01-18 DIAGNOSIS — H524 Presbyopia: Secondary | ICD-10-CM | POA: Diagnosis not present

## 2021-01-19 NOTE — Telephone Encounter (Signed)
Called and informed patient that she would have to pay $285 OOP. Patient was uncomfortable with this amount. Will run pharmacy benefits.

## 2021-01-20 DIAGNOSIS — L928 Other granulomatous disorders of the skin and subcutaneous tissue: Secondary | ICD-10-CM | POA: Diagnosis not present

## 2021-01-21 ENCOUNTER — Encounter: Payer: Self-pay | Admitting: Family Medicine

## 2021-01-24 NOTE — Telephone Encounter (Signed)
[name] - Client Nonclinical Telephone Record  [facility] Client [facility] Night - Client Client Site [place] Physician [name] - MD Contact Type Call Who Is Calling Physician / Provider / Hospital Call Type Provider Call Message Only Reason for Call Request to send message to Office Initial Comment Caller states she is returning a call to [facility] in regards to patient's prescription benefits. Additional Comment [name] 1946/01/29 [facility] w/[employer] Assist w/Benefits [phone] ext [phone] Provided office hours Disp. Time Disposition Final User 01/21/2021 5:05:40 PM General Information Provided Yes Chuck Hint Call Closed By: Chuck Hint Transaction Date/Time: 01/21/2021 5:00:32 PM (ET)

## 2021-01-25 ENCOUNTER — Other Ambulatory Visit: Payer: Self-pay

## 2021-01-25 ENCOUNTER — Ambulatory Visit: Payer: Medicare Other

## 2021-01-25 ENCOUNTER — Encounter: Payer: Self-pay | Admitting: Counselor

## 2021-01-25 ENCOUNTER — Ambulatory Visit (INDEPENDENT_AMBULATORY_CARE_PROVIDER_SITE_OTHER): Payer: Medicare Other | Admitting: Counselor

## 2021-01-25 DIAGNOSIS — F0670 Mild neurocognitive disorder due to known physiological condition without behavioral disturbance: Secondary | ICD-10-CM

## 2021-01-25 DIAGNOSIS — G3184 Mild cognitive impairment, so stated: Secondary | ICD-10-CM | POA: Diagnosis not present

## 2021-01-25 DIAGNOSIS — G20 Parkinson's disease: Secondary | ICD-10-CM | POA: Diagnosis not present

## 2021-01-25 DIAGNOSIS — F09 Unspecified mental disorder due to known physiological condition: Secondary | ICD-10-CM

## 2021-01-25 NOTE — Progress Notes (Signed)
[facility]  Patient Name: [name] MRN: [id number] Date of Birth: [birth year] Age: 75 y.o. Education: 9 years  Measurement properties of test scores: IQ, Index, and Standard Scores (SS): Mean = 100; Standard Deviation = 15 Scaled Scores (Ss): Mean = 10; Standard Deviation = 3 Z scores (Z): Mean = 0; Standard Deviation = 1 T scores (T); Mean = 50; Standard Deviation = 10  TEST SCORES:    Note: This summary of test scores accompanies the interpretive report and should not be interpreted by unqualified individuals or in isolation without reference to the report. Test scores are relative to age, gender, and educational history as available and appropriate.   Performance Validity        "A" Random Letter Test Raw  Descriptor      Errors 0 Within Expectation  The Dot Counting Test: 13 Within Expectation  NAB Effort Index 4 Below Expectation      Mental Status Screening     Total Score Descriptor  MMSE 26 MCI      Expected Functioning        Wide Range Achievement Test: Standard/Scaled Score Percentile      Word Reading 72 3      Reynolds Intellectual Screening Test Standard/T-score Percentile      Guess What 33 5      Odd Item Out 49 46  RIST Index 88 21      Attention/Processing Speed        Neuropsychological Assessment Battery (Attention Module, Form 1): T-score Percentile      Digits Forward 45 31      Digits Backwards 54 66      Repeatable Battery for the Assessment of Neuropsychological Status (Form A): Scaled Score Percentile      Coding 7 16      Language        Neuropsychological Assessment Battery (Language Module, Form 1): T-score Percentile      Naming   (26) 37 9      Verbal Fluency: T-score Percentile      Controlled Oral Word Association (F-A-S) 45 31      Semantic Fluency (Animals) 26 1      Memory:        Neuropsychological Assessment Battery (Memory Module, Form 1): T-score Percentile      List  Learning           List A Immediate Recall   (3, 3, 4) 27 1         List B Immediate Recall   (2) 41 18         List A Short Delayed Recall   (4) 41 18         List A Long Delayed Recall   (3) 39 14         List A Percent Retention   (75 %) --- 31         List A Long Delayed Yes/No Recognition Hits   (7) --- <1         List A Long Delayed Yes/No Recognition False Alarms   (10) --- 5         List A Recognition Discriminability Index --- <1      Story Learning           Immediate Recall   (17, 26) 38 12         Delayed Recall   (32) 54 66  Percent Retention   (123 %) --- 88      Daily Living Memory            Immediate Recall   (20, 8) 38 12          Delayed Recall   (8, 0) 38 12          Percent Retention (67 %) --- 12          Recognition Hits    (4) --- <1      Repeatable Battery for the Assessment of Neuropsychological Status (Form A): Scaled Score Percentile         Figure Recall   (9) 8 25      Visuospatial/Constructional Functioning        Repeatable Battery for the Assessment of Neuropsychological Status (Form A): Standard/Scaled Score Percentile     Visuospatial/Constructional Index 60 <1         Figure Copy   (13) 4 2         Judgment of Line Orientation   (6) --- <2      Executive Functioning        Modified [place] Card Sorting Test (MWCST): Standard/T-Score Percentile      Number of Categories Correct 39 14      Number of Perseverative Errors 51 54      Number of Total Errors 46 34      Percent Perseverative Errors 55 69  Executive Function Composite 92 30      Trail Making Test: T-Score Percentile      Part A 44 27      Part B 49 46      [place] Diagnostic Aphasia Exam: Raw Score Scaled Score      Complex Ideational Material 9 5      Clock Drawing Raw Score Descriptor      Command 7 Mild Impairment      Rating Scales        Parkinson's Disease Questionnaire - 39 Percentage Descriptor  [id number] SI 21 ---  Mobility 35 ---  Activities of Daily  Living 46 ---  Emotional Well Being 0 ---  Stigma 13 ---  Social Support 0 ---  Cognition 25 ---  Communication 17 ---  Bodily Discomfort 33 ---      Quick Dementia Rating System Raw Score Descriptor      Sum of Boxes 1 MCI      Total Score 2.5 MCI  Geriatric Depression Scale - Short Form 2 Negative   Laraya Pestka V. Nicole Kindred PsyD, [place] Clinical Neuropsychologist

## 2021-01-25 NOTE — Progress Notes (Signed)
   Psychometrist Note   Cognitive testing was administered to [name] by Lamar Benes, B.S. (Technician) under the supervision of Alphonzo Severance, Psy.D., ABN. [name] was able to tolerate all test procedures. Dr. Nicole Kindred met with the patient as needed to manage any emotional reactions to the testing procedures. Rest breaks were offered.    The battery of tests administered was selected by Dr. Nicole Kindred with consideration to the patient's current level of functioning, the nature of her symptoms, emotional and behavioral responses during the interview, level of literacy, observed level of motivation/effort, and the nature of the referral question. This battery was communicated to the psychometrist. Communication between Dr. Nicole Kindred and the psychometrist was ongoing throughout the evaluation and Dr. Nicole Kindred was immediately accessible at all times. Dr. Nicole Kindred provided supervision to the technician on the date of this service, to the extent necessary to assure the quality of all services provided.    [name] will return in approximately one week for an interactive feedback session with Dr. Nicole Kindred, at which time test performance, clinical impressions, and treatment recommendations will be reviewed in detail. The patient understands she can contact our office should she require our assistance before this time.   A total of 110 minutes of billable time were spent with [name] by the technician, including test administration and scoring time. Billing for these services is reflected in Dr. Les Pou note.   This note reflects time spent with the psychometrician and does not include test scores, clinical history, or any interpretations made by Dr. Nicole Kindred. The full report will follow in a separate note.
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
## 2021-01-26 NOTE — Progress Notes (Signed)
[facility]  Patient Name: [name] MRN: [id number] Date of Birth: [birth year] Age: 75 y.o. Education: 9 years  Clinical Impressions  [name] is a 75 y.o., right-hand dominant, woman with a history of idiopathic Parkinson's disease. Her disease started with right sided symptoms sometime before 2013 and progressed to bilateral involvement by 2016. She has been following with Dr. Carles Collet since that time and has been having discussions about DBS. She has notable dyskinesia and on/off fluctuations. With regard to the DBS surgical procedure, the patient would like to achieve better control of her symptoms and diminish her dyskinesias. She would be satisfied with 50%-75% improvement. She presented as well informed about the procedure, it's risks, and its benefits, after her previous conversations with Dr. Carles Collet. She has also spoken with patients who have had the procedure. Her neuropsychological test data suggests no more than a mild cognitive impairment level problem and there are no psychiatric contraindications, and she is therefore deemed an adequate candidate. I would note that there is a minor caveat in terms of some findings suggesting posterior dysfunction involving visuospatial abilities and language in addition to the more typical frontal-subcortical problems encountered in PD MCI. This can be viewed as a risk factor for subsequent development of dementia although she does not have a dementia at present. Specific questions of the referring provider answered within a reasonable degree of neuropsychological certainty are enumerated below:  1). Is the patient experiencing cognitive symptoms that far exceed what is expected for their situation? No.   2). Is there a separate neurological process at work? No, my sense is that her cognitive difficulties are most likely driven by her Parkinson's disease considering all available information.   3). Were any  psychological stressors identified within the individuals and/or family beyond PD that may impact post-operative adjustment? No, the patient appears to be psychiatrically stable and has good family support.   4). Can this person cope with the stress of surgery and be compliant as an awake participant in surgery? Yes. My impression is that this patient can likely tolerate the awake nature of the procedure and remain compliant throughout.   5). Can this person participate in the multiple post-operative programming sessions and medication adjustments? Yes. [name] has good family support, consistent transportation, and a demonstrated history of good communication, compliance, and attendance at medical appointments.   6). From a neuropsychological perspective, does this person appear to be a good candidate for DBS? Yes, [name] appears to be an adequate candidate for DBS. I would note that she does have evidence of some "posterior" dysfunction, which can confer increased risk for PDD in the future, although at present she is at no more than a mild cognitive impairment level problem.   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 That is to say, you did have findings that were below expectations for you in several areas including with memory encoding, on certain language measures, and on measures of visuospatial/constructional abilities. In the context of your well-preserved day-to-day functioning, I think that the best diagnosis is Mild Cognitive Impairment.   The major difference between mild cognitive impairment (MCI) and dementia is in severity and potential prognosis. Once someone reaches a level of severity adequate to be diagnosed with a dementia, there is usually progression over time, though this may  be years. On the other hand, mild cognitive impairment, while a significant risk for dementia in future, does not always progress to dementia, and in some instances stays the same or can even revert to normal. It is important to realize that if MCI is due to underlying Alzheimer's disease, it will most likely progress to dementia eventually. The rate of conversion to Alzheimer's dementia from amnestic MCI is about 15% per year versus the general population risk of conversion of 2% per year.   In your case, your mild cognitive impairment is likely due primarily to Parkinson's disease.  As for the DBS procedure, I think you are an adequate candidate, so long as you are adequately informed about the risks and benefits. You do have MCI and some signs that have been shown to be predictive of subsequent development of dementia in the literature, and you are thus at increased risk for some negative cognitive outcome following the surgery, but I do not think this is a rule out so much as something for you to consider. The DBS procedure is typically fairly well tolerated and most individuals experience mild to minimal cognitive decline. You should know that there can be a postoperative confusional period that is typically transient and short lived.   Your goals for the DBS procedure seem reasonable and you seem adequately informed about the risks and benefits of the procedure. I would encourage you to continue your ongoing dialogue with Dr. Carles Collet as additional questions come up.   Test Findings  Test scores are summarized in additional documentation associated with this encounter. Test scores are relative to age, gender, and educational history as available and appropriate. There were no concerns about performance validity as all findings fell within normal expectations.   General Intellectual Functioning/Achievement:  Performance on single word reading was unusually low. Her performance on the RIST index was low  average. Her score on the more verbally oriented portion of the task was unusually low whereas her score on the more visually oriented portion of the task was average. Taken together, these findings suggest either limited prior verbal abilities and/or academic enrichment, tempering expectations for test performance (particularly language mediated test performance) to some extent.   Attention and Processing Efficiency: Performance on indicators of attention and working memory was good with average range scores for digit repetition forward and backward. She actually had stronger performance on the more challenging backward measure, which loads more heavily on working memory. Speed of processing was low average on timed number-symbol coding whereas simple numeric sequencing was average.   Language: Performance on language measures showed low visual object confrontation naming. She got many of the words she missed with phonemic and semantic cues, suggesting this is a word retrieval as opposed to a premorbid knowledge problem. Generation of words was average in response to the letters F-A-S whereas generation of animals in one minute was extremely low.   Visuospatial Function: Performance on visuospatial and constructional measures was impaired, although there is some significant graphomotor confound given her movement issues. Her score on the overall index was extremely low. Figure copy was extremely low and judgment of angular line orientations was also extremely low. Inspection of her individual responses suggests this was not misunderstanding the task and is likely an actual visuoperceptual issue.   Learning and Memory: Performance on measures of learning and memory was notable for more low scores than expected for an individual of her overall ability, with the profile showing primarily encoding problems. There is likely  an executive contribution given better short story as compared to word list learning.  She does not have a memory storage difficulty and generally retained information well across time.   In the verbal realm, Ms. Frieling demonstrated extremely low word list learning with 3, 3, and 4 words across 12 learning trials. Following a brief delay, she recalled all 4 words, which is low average and she retained 3 words to long delayed recall, which is low average. Her yes/no recognition disriminability for the words was poor, perhaps due to difficulties choosing amongst alternatives. Short story learning was low average and delayed recall was average. Memory for brief daily living type information was low average on immediate and delayed recall. Her recognition was once again extremely low, which is a finding of uncertain significance in the context of her delayed free recall performance.   Executive Functions: Performance on executive measures was variable, with an impression of some difficulties given scattered low scores and her memory profile is also supportive. She performed at an average level on the Executive Function Composite of the [employer] of words in response to the letters F-A-S was average. Performance was also average on the challenging Trail Making B test, involving alternating sequencing of numbers and letters of the alphabet. Reasoning with verbal information was unusually low on the Complex Ideational Material. Clock drawing was consistent with "Mild Impairment," with errors in spatial arrangement of the numbers and incorrect stimulus bound hand position.   Rating Scale(s): Ms. Slager was characterized as functioning at a questionable cognitive impairment to MCI level on the QDRS by her son [name]. She screened negative for the presence of dementia.   Viviano Simas Nicole Kindred PsyD, [place] Clinical Neuropsychologist

## 2021-01-28 NOTE — Telephone Encounter (Signed)
[employer] called and stated that their fax was down, but they will send the pharmacy benefits as soon as the problem is resolved.

## 2021-01-31 NOTE — Telephone Encounter (Signed)
Called and LVM for patient to return call to office regarding Prolia.

## 2021-02-01 ENCOUNTER — Other Ambulatory Visit: Payer: Self-pay | Admitting: Neurology

## 2021-02-01 ENCOUNTER — Other Ambulatory Visit: Payer: Self-pay | Admitting: Family Medicine

## 2021-02-01 NOTE — Telephone Encounter (Signed)
ERx 

## 2021-02-01 NOTE — Telephone Encounter (Signed)
Pharmacy requests refill on: Clonazepam 0.5 mg   LAST REFILL: 10/27/2020 (Q-60, R-0) LAST OV: 10/27/2020 NEXT OV: 04/27/2021 PHARMACY: [facility]

## 2021-02-02 ENCOUNTER — Other Ambulatory Visit: Payer: Self-pay

## 2021-02-02 MED ORDER — TRAMADOL HCL 50 MG PO TABS: 50.0000 mg | ORAL_TABLET | Freq: Three times a day (TID) | ORAL | 0 refills | Status: DC | PRN

## 2021-02-02 NOTE — Telephone Encounter (Signed)
ERx 

## 2021-02-02 NOTE — Telephone Encounter (Signed)
Pharmacy requests refill on: Tramadol 50 mg   LAST REFILL: 01/10/2021 (Q-50, R-0) LAST OV: 10/27/2020 NEXT OV: 04/27/2021 PHARMACY: [facility]

## 2021-02-03 NOTE — Telephone Encounter (Signed)
Spoke with patient regarding her Prolia injection and informed her that she would have to pay $285 OOP. Patient verbalized understanding. Labs scheduled for 3/15 and NV scheduled for 3/29.

## 2021-02-03 NOTE — Addendum Note (Signed)
Addended by: Ronna Polio on: 02/03/2021 04:52 PM   Modules accepted: Orders

## 2021-02-07 ENCOUNTER — Ambulatory Visit (INDEPENDENT_AMBULATORY_CARE_PROVIDER_SITE_OTHER): Payer: Medicare Other | Admitting: Family Medicine

## 2021-02-07 ENCOUNTER — Encounter: Payer: Self-pay | Admitting: Family Medicine

## 2021-02-07 ENCOUNTER — Other Ambulatory Visit: Payer: Self-pay

## 2021-02-07 VITALS — BP 100/58 | HR 70 | Temp 97.4°F | Ht 64.0 in | Wt 158.0 lb

## 2021-02-07 DIAGNOSIS — R350 Frequency of micturition: Secondary | ICD-10-CM | POA: Diagnosis not present

## 2021-02-07 DIAGNOSIS — N3000 Acute cystitis without hematuria: Secondary | ICD-10-CM | POA: Diagnosis not present

## 2021-02-07 LAB — POC URINALSYSI DIPSTICK (AUTOMATED)
Bilirubin, UA: NEGATIVE
Blood, UA: NEGATIVE
Glucose, UA: NEGATIVE
Ketones, UA: NEGATIVE
Nitrite, UA: NEGATIVE
Protein, UA: NEGATIVE
Spec Grav, UA: 1.01 (ref 1.010–1.025)
Urobilinogen, UA: 0.2 E.U./dL
pH, UA: 6 (ref 5.0–8.0)

## 2021-02-07 MED ORDER — CEPHALEXIN 500 MG PO CAPS
500.0000 mg | ORAL_CAPSULE | Freq: Two times a day (BID) | ORAL | 0 refills | Status: AC
Start: 2021-02-07 — End: 2021-02-14

## 2021-02-07 NOTE — Progress Notes (Signed)
   Subjective:     [name] is a 75 y.o. female presenting for Urinary Frequency (All symptoms x 24 hours ), Urinary Urgency, Back Pain, and Abdominal Pain     Urinary Frequency  This is a new problem. The current episode started yesterday. The problem has been gradually worsening. Quality: pressure. Associated symptoms include flank pain, frequency, hesitancy and urgency. Pertinent negatives include no hematuria, nausea or vomiting. She has tried increased fluids for the symptoms. The treatment provided no relief.  Back Pain Associated symptoms include abdominal pain.  Abdominal Pain Associated symptoms include frequency. Pertinent negatives include no hematuria, nausea or vomiting.    Previously treated with keflex with good response   Review of Systems  Gastrointestinal: Positive for abdominal pain. Negative for nausea and vomiting.  Genitourinary: Positive for flank pain, frequency, hesitancy and urgency. Negative for hematuria.  Musculoskeletal: Positive for back pain.     Social History   Tobacco Use  Smoking Status Never Smoker  Smokeless Tobacco Never Used        Objective:    BP Readings from Last 3 Encounters:  02/07/21 (!) 100/58  01/12/21 100/60  10/27/20 126/80   Wt Readings from Last 3 Encounters:  02/07/21 158 lb (71.7 kg)  01/12/21 164 lb 8 oz (74.6 kg)  12/22/20 164 lb (74.4 kg)    BP (!) 100/58   Pulse 70   Temp (!) 97.4 F (36.3 C) (Temporal)   Ht 5\' 4"  (1.626 m)   Wt 158 lb (71.7 kg)   SpO2 98%   BMI 27.12 kg/m    Physical Exam Constitutional:      General: She is not in acute distress.    Appearance: She is well-developed. She is not diaphoretic.  HENT:     Head: Normocephalic and atraumatic.  Eyes:     Conjunctiva/sclera: Conjunctivae normal.  Cardiovascular:     Rate and Rhythm: Normal rate and regular rhythm.     Heart sounds: Normal heart sounds.  Pulmonary:     Effort: Pulmonary effort is normal.  Abdominal:      General: Bowel sounds are normal. There is no distension.     Palpations: Abdomen is soft.     Tenderness: There is abdominal tenderness in the suprapubic area. There is no right CVA tenderness, left CVA tenderness or guarding.  Musculoskeletal:     Cervical back: Neck supple.  Skin:    General: Skin is warm and dry.  Neurological:     Mental Status: She is alert.    UA: + LE       Assessment & Plan:   Problem List Items Addressed This Visit   None   Visit Diagnoses    Acute cystitis without hematuria    -  Primary   Relevant Medications   cephALEXin (KEFLEX) 500 MG capsule   Urinary frequency       Relevant Orders   POCT Urinalysis Dipstick (Automated) (Completed)     UA similar to prior. Reviewed last UCX pan-sensitive and treated with keflex. Call if not improving. Will f/u UCx  Return if symptoms worsen or fail to improve.  Lesleigh Noe, MD  This visit occurred during the SARS-CoV-2 public health emergency.  Safety protocols were in place, including screening questions prior to the visit, additional usage of staff PPE, and extensive cleaning of exam room while observing appropriate contact time as indicated for disinfecting solutions.

## 2021-02-07 NOTE — Patient Instructions (Signed)
Will send urine for culture  Continue hydration  Take the antibiotic  Call if not improving or fevers/chills worsening symptoms

## 2021-02-07 NOTE — Addendum Note (Signed)
Addended by: Loreen Freud on: 02/07/2021 03:12 PM   Modules accepted: Orders

## 2021-02-08 ENCOUNTER — Other Ambulatory Visit (INDEPENDENT_AMBULATORY_CARE_PROVIDER_SITE_OTHER): Payer: Medicare Other

## 2021-02-08 ENCOUNTER — Encounter: Payer: Self-pay | Admitting: Counselor

## 2021-02-08 ENCOUNTER — Ambulatory Visit (INDEPENDENT_AMBULATORY_CARE_PROVIDER_SITE_OTHER): Payer: Medicare Other | Admitting: Counselor

## 2021-02-08 ENCOUNTER — Telehealth: Payer: Self-pay | Admitting: Neurology

## 2021-02-08 DIAGNOSIS — G20A1 Parkinson's disease without dyskinesia, without mention of fluctuations: Secondary | ICD-10-CM

## 2021-02-08 DIAGNOSIS — M810 Age-related osteoporosis without current pathological fracture: Secondary | ICD-10-CM

## 2021-02-08 DIAGNOSIS — F0670 Mild neurocognitive disorder due to known physiological condition without behavioral disturbance: Secondary | ICD-10-CM

## 2021-02-08 DIAGNOSIS — G20 Parkinson's disease: Secondary | ICD-10-CM

## 2021-02-08 DIAGNOSIS — G3184 Mild cognitive impairment, so stated: Secondary | ICD-10-CM

## 2021-02-08 HISTORY — DX: Parkinson's disease without dyskinesia, without mention of fluctuations: G20.A1

## 2021-02-08 HISTORY — DX: Mild neurocognitive disorder due to known physiological condition without behavioral disturbance: F06.70

## 2021-02-08 LAB — BASIC METABOLIC PANEL
BUN: 16 mg/dL (ref 6–23)
CO2: 30 mEq/L (ref 19–32)
Calcium: 9.7 mg/dL (ref 8.4–10.5)
Chloride: 102 mEq/L (ref 96–112)
Creatinine, Ser: 0.87 mg/dL (ref 0.40–1.20)
GFR: 66.47 mL/min (ref >60.00)
Glucose, Bld: 87 mg/dL (ref 70–99)
Potassium: 4.2 mEq/L (ref 3.5–5.1)
Sodium: 138 mEq/L (ref 135–145)

## 2021-02-08 LAB — URINE CULTURE
MICRO NUMBER:: 11643684
SPECIMEN QUALITY:: ADEQUATE

## 2021-02-08 NOTE — Telephone Encounter (Signed)
Patient needs to know when to stop her medication for the on/ off test on 02-10-21. She also wants to make sure that Dr [name] still wants her to keep her 02-24-21 appt, please call

## 2021-02-08 NOTE — Patient Instructions (Signed)
Your performance and presentation on assessment today were consistent with a mild level of cognitive dysfunction. That is to say, you did have findings that were below expectations for you in several areas including with memory encoding, on certain language measures, and on measures of visuospatial/constructional abilities. In the context of your well-preserved day-to-day functioning, I think that the best diagnosis is Mild Cognitive Impairment.   The major difference between mild cognitive impairment (MCI) and dementia is in severity and potential prognosis. Once someone reaches a level of severity adequate to be diagnosed with a dementia, there is usually progression over time, though this may be years. On the other hand, mild cognitive impairment, while a significant risk for dementia in future, does not always progress to dementia, and in some instances stays the same or can even revert to normal.It is important to realize that if MCI is due to underlying Alzheimer's disease, it will most likely progress to dementia eventually. The rate of conversion to Alzheimer's dementiafrom amnestic MCI is about 15% per year versus the general population risk of conversion of 2% per year.  In your case, your mild cognitive impairment is likely due primarily to Parkinson's disease.  As for the DBS procedure, I think you are an adequate candidate, so long as you are adequately informed about the risks and benefits. You do have MCI and some signs that have been shown to be predictive of subsequent development of dementia in the literature, and you are thus at increased risk for some negative cognitive outcome following the surgery, but I do not think this is a rule out so much as something for you to consider. The DBS procedure is typically fairly well tolerated and most individuals experience mild to minimal cognitive decline. You should know that there can be a postoperative confusional period that is typically  transient and short lived.   Your goals for the DBS procedure seem reasonable and you seem adequately informed about the risks and benefits of the procedure. I would encourage you to continue your ongoing dialogue with Dr. Carles Collet as additional questions come up.

## 2021-02-08 NOTE — Progress Notes (Signed)
NEUROPSYCHOLOGY FEEDBACK NOTE [facility]  Feedback Note: I met with [name] to review the findings resulting from her neuropsychological evaluation. Since the last appointment, she has been about the same. She feels her dyskinesias and tremors are acting up today. Time was spent reviewing the impressions and recommendations that are detailed in the evaluation report. We discussed impression of mild neurocognitive disorder due to Parkinson's disease, as reflected in the patient instructions. I was candid with her about patient selection issues in DBS, the fact that she does have some potential minor red flag signs (e.g., indicators of posterior involvement), but there is no obvious copathology and she is only at an MCI level of problem. We discussed her decision making and I gave her feedback about it (her goals are well aligned with the surgery given her desire to decrease tremor and dyskinesia). I took time to explain the findings and answer all the patient's questions. I encouraged [name] to contact me should she have any further questions or if further follow up is desired.   Current Medications and Medical History   Current Outpatient Medications  Medication Sig Dispense Refill  . AMBULATORY NON FORMULARY MEDICATION Lift chair Dx:  G20 1 Device 0  . benzonatate (TESSALON) 200 MG capsule Take 1 capsule (200 mg total) by mouth 3 (three) times daily as needed for cough. 15 capsule 0  . Calcium Carbonate-Vitamin D (CALCIUM 600/VITAMIN D) 600-400 MG-UNIT chew tablet Chew 1 tablet by mouth daily.    . carbidopa-levodopa (SINEMET CR) 50-200 MG tablet Take 1 tablet by mouth at bedtime. 90 tablet 0  . carbidopa-levodopa (SINEMET IR) 25-100 MG tablet 2 tablet at 6am/1 tablet at 9:30am/1pm/4:30pm.  May take an extra 1/2 tablet prn evening 540 tablet 1  . cephALEXin (KEFLEX) 500 MG capsule Take 1 capsule (500 mg total) by mouth 2 (two) times daily for 7 days. 14 capsule 0  .  Cholecalciferol (VITAMIN D3) 25 MCG (1000 UT) CAPS Take 1 capsule (1,000 Units total) by mouth daily. 30 capsule   . clonazePAM (KLONOPIN) 0.5 MG tablet TAKE 1 TABLET BY MOUTH  TWICE DAILY AS NEEDED 60 tablet 0  . cyanocobalamin (,VITAMIN B-12,) 1000 MCG/ML injection INJECT 1 ML (1,000 MCG TOTAL) INTO THE MUSCLE EVERY 30 (THIRTY) DAYS. 3 mL 2  . gabapentin (NEURONTIN) 100 MG capsule Take 2 capsules (200 mg total) by mouth at bedtime. 180 capsule 3  . polyethylene glycol (MIRALAX / GLYCOLAX) packet Take 17 g by mouth as needed. 30 each 1  . pramipexole (MIRAPEX) 0.5 MG tablet Take 1 tablet (0.5 mg total) by mouth 3 (three) times daily. 270 tablet 1  . sertraline (ZOLOFT) 100 MG tablet Take 1 tablet (100 mg total) by mouth daily. 90 tablet 1  . traMADol (ULTRAM) 50 MG tablet Take 1 tablet (50 mg total) by mouth 3 (three) times daily as needed. 50 tablet 0  . triamcinolone cream (KENALOG) 0.1 % APPLY TO AFFECTED AREA TWICE A DAY 45 g 1   No current facility-administered medications for this visit.    Patient Active Problem List   Diagnosis Date Noted  . 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[place]) 02/08/2021  . Cough 12/20/2020  . 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 Left shoulder pain 06/19/2020  . 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 Degenerative lumbar spinal stenosis 11/27/2019  . Knee pain, bilateral 08/06/2018  . Constipation 08/06/2018  . Right lumbar radiculopathy 04/10/2018  . Dysphagia  10/09/2017  . Chronic cough 07/16/2017  . GAD (generalized anxiety disorder) 04/28/2016  . Chronic insomnia 03/07/2016  . Left Achilles tendinitis 12/08/2015  . Parkinson's disease ([place]) 08/25/2015  . Advanced care planning/counseling discussion 05/26/2015  . Family circumstance 02/23/2015  . Health maintenance examination 05/21/2014  . MDD (major depressive disorder), single episode, moderate ([place]) 11/12/2013  . DDD (degenerative disc disease),  lumbar 05/31/2013  . Medicare annual wellness visit, subsequent 05/20/2013  . HLD (hyperlipidemia) 05/20/2013  . Vitamin D deficiency 05/09/2013  . Recurrent UTI 01/03/2011  . Rash and other nonspecific skin eruption 05/11/2010  . GERD 02/22/2010  . CAD (coronary artery disease) 01/25/2010  . Osteoporosis 11/10/2009  . Orthostatic hypotension due to Parkinson's disease ([place]) 12/06/2008  . Anemia 09/25/2007  . Vitamin B12 deficiency 05/03/2007  . Fibromyalgia 05/03/2007    Mental Status and Behavioral Observations  [name] presented on time to the present encounter and was alert and generally oriented. Speech was normal in rate, rhythm, volume, and prosody. Self-reported mood was "allright" and affect euthymic albeit with hypomimia. Thought process was logical and goal oriented although she did seem to jump from topic to topic a bit. Thought content was appropriate. There were no safety concerns identified at today's encounter, such as thoughts of harming self or others.   Plan  Feedback provided regarding the patient's neuropsychological evaluation. She is a good candidate for DBS, with the caveat that she has some signs of posterior dysfunction that can be harbingers of increased cognitive impairment in the future. Nevertheless, she is at an MCI level problem presently, has good support, and reasonable decision making. She will follow up with Dr. Carles Collet for on/off testing Thursday. [name] was encouraged to contact me if any questions arise or if further follow up is desired.   Viviano Simas Nicole Kindred, PsyD, [employer] Clinical Neuropsychologist  Service(s) Provided at This Encounter: 28 minutes [phone]; Conjoint therapy with patient present)

## 2021-02-08 NOTE — Telephone Encounter (Signed)
Left message for patient to contact office.   (per Dr [name] patient is to hold medication after tonight)

## 2021-02-08 NOTE — Telephone Encounter (Signed)
See secure chat I sent you this morning about this

## 2021-02-08 NOTE — Progress Notes (Signed)
Assessment/Plan:    1.  Parkinsons Disease             -takecarbidopa/levodopa 25/100,2tablet at 6am/ and 1 tablet 9:30am/1pm/4:30pm. May take an extra 1/2 tablet if needed in the evening.  Discussed that covid may increase sx's -Continue carbidopa/levodopa 50/200 at bedtime -Continue pramipexole 0.5 mg 3 times per day -Patient has completed neurocognitive testing on January 25, 2021 with Dr. Nicole Kindred.  Evidence of MCI only.  -Levodopa challenge test was done today and demonstrated marked benefit to levodopa.  She did get fairly dyskinetic after the levodopa.  -I talked to the patient about the logistics associated with DBS therapy.  I talked to the patient about risks/benefits/side effects of DBS therapy.  We talked about risks which included but were not limited to infection, paralysis, intraoperative seizure, death, stroke, bleeding around the electrode.   I talked to patient about fiducial placement 1 week prior to DBS therapy.  I talked to the patient about what to expect in the operating room, including the fact that this is an awake surgery.  We talked about battery placement as well as which is done under general anesthesia, generally approximately one week following the initial surgery.  We also talked about the fact that the patient will need to be off of medications for surgery.  The patient and family were given the opportunity to ask questions, which they did, and I answered them to the best of my ability today.  She is interested in [facility].  We would started L STN.  -Discussed with the patient that I will schedule preoperative MRI (will need some Valium for this just to keep her still) and an appointment with Dr. Vertell Limber.  She will need an appointment with me before surgery.  However, during that appointment with me, I really need to see her family.  I have not seen her family particularly involved over the last few years (she  always comes in alone), and it is very important to have strong family support throughout DBS.  In addition, I need her family to understand the risks that are being undertaken, even though the patient understands.  I tried to call her son during the appt but he said that he was picking up his niece and nephew at school.  He had originally dropped the patient off of the appointment and sat in the parking lot. 2. Dyskinesia -Now off of amantadine because of hallucinations. -Have reduced pramipexole because of significant dyskinesia as well. 3. Neurogenic Orthostatic Hypotension -Did well on Northera. Patient stopped that on her own. We will need to watch that. Denies lightheaded now 4.Depression -On sertraline -Declines counseling 5. b12 deficiency -on injections. 6. Urinary incontinence -on myrbetriq. She is doing well in that regard 7. Lumbar radiculopathy -Sheis status post lumbar foraminotomy with Dr. Vertell Limber on November 27, 2019 9. 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  My previous records were reviewed prior to todays visit as well as outside records available to me. Pt denies falls.  Pt denies lightheadedness, near syncope.  No hallucinations.  Mood has been good.  Patient had neurocognitive testing with Dr. Nicole Kindred did not test last visit.  No evidence of dementia.  Patient has been off of levodopa today for over 30 hours along with being off of pramipexole.  She was surprised how bad she felt.  She could hardly move.  She was very slow and had trouble walking.  She was surprised that everything felt tremulous.  She was having trouble even speaking.  Current prescribed movement disorder medications: carbidopa/levodopa  25/100,2tablet at 6am/ and 1 tablet 9:30am/1pm/4:30pm  Carbidopa/levodopa 50/200 at bedtime Pramipexole0.5 mg 3 times per day Sertraline, 100 mg daily  PREVIOUS MEDICATIONS: northera (worked well but pt d/c when they quit sending b/c she owed $);amantadine (discontinued because of hallucinations);pramipexole (decreased in past because of hallucinations/dyskinesia)  ALLERGIES:   Allergies  Allergen Reactions  . Amitriptyline Other (See Comments)    Sedated next morning  . Ciprofloxacin Nausea And Vomiting  . Cymbalta [Duloxetine Hcl] Other (See Comments)    Worsened depression  . Imipramine Hcl     REACTION: rash  . Iohexol      Code: HIVES, Desc: PT developed 2 hives, followed by SOB, severe headache post 87cc's Omnipaque 300., Onset Date: [id number]   . Lyrica [Pregabalin] Other (See Comments)    Tried during hospitalization - unsure effects but unable to tolerate  . Morphine Sulfate     REACTION: rash  . Sulfamethoxazole     REACTION: rash  . Iodine Rash  . Lidocaine Hcl Rash  . Neosporin [Neomycin-Bacitracin Zn-Polymyx] Rash    Worsened skin breaking out  . Tetracyclines & Related Rash    CURRENT MEDICATIONS:  Outpatient Encounter Medications as of 02/10/2021  Medication Sig  . AMBULATORY NON FORMULARY MEDICATION Lift chair Dx:  G20  . benzonatate (TESSALON) 200 MG capsule Take 1 capsule (200 mg total) by mouth 3 (three) times daily as needed for cough.  . Calcium Carbonate-Vitamin D (CALCIUM 600/VITAMIN D) 600-400 MG-UNIT chew tablet Chew 1 tablet by mouth daily.  . carbidopa-levodopa (SINEMET CR) 50-200 MG tablet Take 1 tablet by mouth at bedtime.  . carbidopa-levodopa (SINEMET IR) 25-100 MG tablet 2 tablet at 6am/1 tablet at 9:30am/1pm/4:30pm.  May take an extra 1/2 tablet prn evening  . cephALEXin (KEFLEX) 500 MG capsule Take 1 capsule (500 mg total) by mouth 2 (two) times daily for 7 days.  . Cholecalciferol (VITAMIN D3) 25 MCG (1000 UT) CAPS Take 1 capsule  (1,000 Units total) by mouth daily.  . clonazePAM (KLONOPIN) 0.5 MG tablet TAKE 1 TABLET BY MOUTH  TWICE DAILY AS NEEDED  . cyanocobalamin (,VITAMIN B-12,) 1000 MCG/ML injection INJECT 1 ML (1,000 MCG TOTAL) INTO THE MUSCLE EVERY 30 (THIRTY) DAYS.  Marland Kitchen gabapentin (NEURONTIN) 100 MG capsule Take 2 capsules (200 mg total) by mouth at bedtime.  . polyethylene glycol (MIRALAX / GLYCOLAX) packet Take 17 g by mouth as needed.  . pramipexole (MIRAPEX) 0.5 MG tablet Take 1 tablet (0.5 mg total) by mouth 3 (three) times daily.  . sertraline (ZOLOFT) 100 MG tablet Take 1 tablet (100 mg total) by mouth daily.  . traMADol (ULTRAM) 50 MG tablet Take 1 tablet (50 mg total) by mouth 3 (three) times daily as needed.  . triamcinolone cream (KENALOG) 0.1 % APPLY TO AFFECTED AREA TWICE A DAY  . [EXPIRED] carbidopa-levodopa (SINEMET IR) 25-100 MG per tablet immediate release 3 tablet    No facility-administered encounter medications on file as of 02/10/2021.    Objective:   PHYSICAL EXAMINATION:    VITALS:   Vitals:   02/10/21 1305  BP: 118/82  Pulse: 79  SpO2: 97%  Weight: 155 lb (70.3 kg)  Height: 5\' 4"  (1.626 m)    GEN:  The patient appears stated age and is in NAD. HEENT:  Normocephalic,  atraumatic.  The mucous membranes are moist. The superficial temporal arteries are without ropiness or tenderness. CV:  RRR Lungs:  CTAB Neck/HEME:  There are no carotid bruits bilaterally.  Neurological examination:  Orientation: The patient is alert and oriented x3. Cranial nerves: There is good facial symmetry with significant facial hypomimia.  Lips were parted, especially before addition of levodopa.  The speech is fluent and clear. Soft palate rises symmetrically and there is no tongue deviation. Hearing is intact to conversational tone. Sensation: Sensation is intact to light touch throughout Motor: Strength is at least antigravity x4.  Levodopa challenge done today.  UPDRS motor off score was 62.  Pt  then given 300mg  of levodopa dissolved in ginger ale and waited 40 minutes to re-examine her.  UPDRS motor on score was 17.  Details of UPDRS motor score documented on separate neurophysiologic worksheet.     Movement examination: Tone: Prior to the administration of levodopa, there is at least moderate increased tone in the right upper extremity and mild to moderate increased tone in the left upper extremity.  There is moderate increased tone in the bilateral lower extremities.  After levodopa, rigidity was virtually gone everywhere. Abnormal movements: Prior to the administration of levodopa, there is moderate amplitude, constant right upper extremity rest tremor and mild, near constant bilateral lower extremity rest tremor.  Post levodopa, she had no tremor, but she had at least moderate dyskinesia on the right. Coordination:  There is significant decremation with RAM's, with all rapid alternating movements prior to the addition of levodopa, including alternating supination and pronation of the forearm, hand opening and closing, finger taps, heel taps and toe taps. Gait and Station: The patient is unable to arise without assistance prior to the addition of levodopa.  She is assisted out of the chair and then cannot even walk without assistance.  She shuffles and freezes prior to the addition of levodopa.  She has a positive pull test.  After levodopa, she was able to rise out of the chair without the use of her hands.  She was still short stepped, but she was much more stable and was no longer freezing.  She was able to walk on her own and had a negative pull test.  I have reviewed and interpreted the following labs independently    Chemistry      Component Value Date/Time   NA 138 02/08/2021 1101   K 4.2 02/08/2021 1101   CL 102 02/08/2021 1101   CO2 30 02/08/2021 1101   BUN 16 02/08/2021 1101   CREATININE 0.87 02/08/2021 1101   CREATININE 0.87 10/08/2019 0921      Component Value Date/Time    CALCIUM 9.7 02/08/2021 1101   ALKPHOS 54 10/25/2020 1030   AST 13 10/25/2020 1030   ALT 3 10/25/2020 1030   BILITOT 0.4 10/25/2020 1030       Lab Results  Component Value Date   WBC 3.8 (L) 10/25/2020   HGB 11.2 (L) 10/25/2020   HCT 33.8 (L) 10/25/2020   MCV 87.2 10/25/2020   PLT 248.0 10/25/2020    Lab Results  Component Value Date   TSH 0.49 10/04/2018     Total time spent on today's visit was 70 minutes, including both face-to-face time and nonface-to-face time.  Time included that spent on review of records (prior notes available to me/labs/imaging if pertinent), discussing treatment and goals, answering patient's questions and coordinating care.  This did not include the wait time waiting for levodopa  to kick in.  Cc:  [name], MD

## 2021-02-08 NOTE — Telephone Encounter (Signed)
Returned call to [place].

## 2021-02-08 NOTE — Telephone Encounter (Signed)
Spoke with patient and made her aware of holding instructions for on and off test. She voiced understanding.

## 2021-02-10 ENCOUNTER — Encounter: Payer: Self-pay | Admitting: Neurology

## 2021-02-10 ENCOUNTER — Ambulatory Visit: Payer: Medicare Other | Admitting: Neurology

## 2021-02-10 ENCOUNTER — Other Ambulatory Visit: Payer: Self-pay

## 2021-02-10 VITALS — BP 118/82 | HR 79 | Ht 64.0 in | Wt 155.0 lb

## 2021-02-10 DIAGNOSIS — G249 Dystonia, unspecified: Secondary | ICD-10-CM

## 2021-02-10 DIAGNOSIS — G20 Parkinson's disease: Secondary | ICD-10-CM

## 2021-02-10 MED ORDER — CARBIDOPA-LEVODOPA 25-100 MG PO TABS
3.0000 | ORAL_TABLET | Freq: Once | ORAL | Status: AC
  Administered 2021-02-10: 3 via ORAL

## 2021-02-10 NOTE — Patient Instructions (Signed)
1.  We will schedule your pre-op MRI 2.  Call me when your MRI is scheduled and I will call you in valium to help you hold still Deep Brain Stimulation  Is it the right choice for me?   What is Deep Brain Stimulation (DBS) Surgery?  DBS is a surgical procedure used to treat symptoms of Parkinson's disease (PD). It involves the implantation of an electrode into the brain (one on each side). The area of the brain that is typically targeted in PD is the subthalamic nucleus.   How does DBS work?  PD is caused by the degeneration of brain cells in a specific part of the brain which make a chemical (neurotransmitter) called dopamine. As time goes by, more and more cells degenerate and the level of dopamine in the brain declines. As a result of this dopamine deficiency, there is a certain circuit in the brain which becomes abnormally overactive. Many symptoms of PD are due to this abnormal, overactive circuit. With DBS, high frequency electrical stimulation is used to disrupt this circuit, thereby blocking the symptoms of PD that were previously being mediated through that circuit. The three main symptoms of PD are shaking (tremor), slowness of movement (bradykinesia), and stiffness (rigidity). All of these symptoms are mediated through this small circuit. Therefore, DBS is very effective in blocking these symptoms. It is important to remember, however, that DBS does not "cure" PD, but rather is a very effective method of treating the symptoms of the disease.   What is actually done during the operation?  The surgical procedure involves the implantation of 2 electrodes (one on each side of the brain). The electrodes are connected to 2 wires, which are then connected to a generator- pacemaker like device (either one or two) in the chest. The generator (and wires) are placed under the skin similar to a cardiac pacemaker, thus the device itself is not visible. The implanted hardware does, however, produce a lump  on the chest where the generator is placed and two small bumps on the scalp where underneath there are small plastic caps which are screwed into the skull and secure the electrode.   What symptoms can I expect DBS to treat?  DBS treats many, but not all symptoms of PD. As mentioned above, tremor, stiffness (rigidity), and slowness of movement (bradykinesia) all respond well to DBS therapy. In addition, many patients with advanced PD have problems with what we call "motor fluctuations". This refers to the wearing off of medication before the next dose associated with breakthrough of PD symptoms, and at other times the effects of excess medication, such as involuntary wiggling (dyskinesia). Because the electrical stimulation is constant, the effect is continuous. Therefore motor fluctuations can be significantly reduced. Furthermore, after DBS most patients are able to significantly reduce the amount of Parkinson's medications they were previously taking. Therefore, side effects of these medications can be significantly reduced as well, and often completely eliminated. Common anti-Parkinson medication side effects include: involuntary wiggling (dyskinesia), visual distortions and hallucinations, nausea and vomiting, and lightheadedness.   What symptoms are not treated with DBS?  Some symptoms of PD are mediated through other brain circuits. Therefore, those symptoms would not be expected to improve with DBS. These symptoms include: soft and mumbled speech (hypophonia), balance trouble, and memory deficits. Even if the DBS surgery is done perfectly, the patient will still have PD. Therefore, because DBS does not block all of the effected brain circuits, the above mentioned symptoms will likely continue  to progress and worsen with time.   How functional can I expect to be following DBS surgery?  Most patients who are good candidates improve with DBS. Think about how functional you are now, when your medicines  are "kicked in" and working at their very best. After DBS we can often get you to that point and keep you there, without all of the fluctuations and the medication side effects. Some patients with PD have bad tremor that does not respond well to medication. DBS can work well to control tremor even when medication cannot.   What are the risks of surgery?  Because the surgery involves introducing a foreign object into the brain, there are inherent risks that are present. First, there is a very small risk, approximately 1%, of having bleeding into the brain causing symptoms similar to that of a stroke. Secondly, there is a 5-7% chance of having an infection related to the procedure. If the device gets infected, then the treatment usually requires that the infected hardware be removed temporarily while antibiotics are given. After the infection is resolved, then the hardware needs to be re-implanted. This would not leave the patient with permanent problems, but it is easy to understand how disappointed someone might be if they have to go through the surgery again. Typical symptoms of infection include redness, swelling, or pain around the device on the skin. There is theoretically a very small chance (much less than 1%) that an infection could spread to the brain. This, of course, would be much more serious. Another small risk of brain surgery is possible seizure (2-3%). A seizure produces transient sudden loss of consciousness and generalized shaking (convulsion). This can be caused by irritation of the brain during the operation. If a seizure occurs, it is almost always at the time of operation. It may require temporarily being treated with seizure medications, but this is typically only short term.   How much trouble is it to get DBS?  Unfortunately, undergoing DBS surgery is a process involving multiple steps. Even prior to surgery, there are several steps that must be done. The surgery itself takes place in  three separate parts. About a week prior to insertion of the electrodes, you will be seen in an ambulatory surgery center to put in markers into the skull, called fiducials. This allows [place] to plan the surgery and to better localize the area in which we will operate. One week later, stage 1 of the procedure will be done in which the electrodes are implanted. Approximately one week after this, stage 2 of the surgery will be done in which the generator (battery) is inserted. Stage 1 of the surgery usually takes several hours. This is when the electrode is placed. This surgery has to be done while the patient is awake. Local anesthesia is used, so the procedure is not generally very painful, but obviously it is a little scary to be operated on while you are awake. Furthermore, patients need to be off of their anti-Parkinson medication during the operation, so that we can more easily identify the abnormal circuit in the brain. It is unpleasant being off anti-Parkinson medication, even for this short time. Approximately 6 weeks after the electrode placement, programming of the device will take place at our first in office clinic visit. This allows [place] to alter the type of stimulation and optimize the beneficial effects. This is done over several clinic visits. The second post op clinic visit is usually just a week after the  first, but subsequent visits will be less frequent. Eventually you shouldn't need to be seen more than once every 4-6 months or so. Overall, you should expect several programming visits before you see the full benefit from DBS surgery.   How long does the hardware last?  The generator runs on a battery inside the device. How long the battery lasts depends on the manufacturer of the device and whether you choose are rechargeable device or a traditional battery.  A traditional battery may last 3 to 5 years and a rechargeable device may last 15-20 years. The battery replacement operation, however, is  much easier than the initial elaborate operation. It is typically done as an outpatient procedure.   Does DBS always work?  The key to success is exact placement of the electrode. As you can imagine, the brain has many circuits which are closely packed together. If the electrode is close to being in the right position, but not quite, then there may be a partial response rather than a complete response. If this happens, we may have to turn up the stimulation to try and more completely block the circuit. If we do this, however, we may effect other adjacent circuits that we are not intending to effect, and thereby produce stimulation-related side effects such as slurred speech or facial muscle pulling. These side effects can be easily eliminated by reducing the strength of the stimulation, but then some of the Parkinson symptoms may break through.   Is DBS the right choice for you?  As you can now see, there are many things that carefully need to be considered when making this decision. DBS is not appropriate for all patients with PD. The ultimate decision is yours to make. It is our job to provide you with all the pros and cons, so that you can make the choice that is right for you.  Logistical Details: Pre-Operative Visits:  1) "On-Off" Testing. This visit takes place in the clinic with Dr. Carles Collet several weeks prior to surgery to help determine if you are a good DBS candidate. You will come to the clinic having NOT TAKEN your PD medications. A series of physical examination tests will be done. Then you will be given a dose of carbidopa/levodopa dissolved in ginger ale. Approximately 30 minutes later you will be re-examined with the same tests to see how you respond.   IT IS EXTREMELY IMPORTANT NOT TO TAKE YOUR PARKINSON MEDICATIONS ON THE DAY OF THIS CLINIC VISIT.   2) Neuropsychological Testing. This is standard testing in all potential candidates to help determine those patients that may be at risk for  developing worsening cognition from the procedure. This is a long clinic visit (multiple hours).  3) Pre-Operative MRI. If you are deemed to be a good surgical candidate based on the above 2 visits, you will need to have MRI imaging done. It is very important that we get high quality study. You must have someone accompany you to this visit as we may have to give you sedation in order to make sure the MRI images are of adequate quality.  What to expect regarding surgery:  1. The first step involves placement of the fiducial (reference) pins. This is done the week before surgery by Dr. Vertell Limber. You are given 4 local injections of anesthetic (numbing medication). Next, 4 pins are screwed into the skull. Following the placement of the pins, you will be transferred down to have a head CT scan. The CT is used in  planning for the surgery.  2. Surgery typically takes place one week later. You will have been off all of your Parkinson medications. This can be quite uncomfortable! 3. You will have the sense of "hurry up and wait" multiple times throughout the day, but it is extremely important to remain as patient as possible. It is during these times that we are busy "behind the scenes" doing the surgical planning. 4. In the pre-op area, you will meet with the nurses and anesthesia staff. You may have a catheter placed into the bladder. Once you are taken back to the OR suite, you will be placed in a "beach chair" position. You will not be under general anesthesia. We need you to be awake during certain parts to allow [place] to do important testing. The actual surgical procedure is not generally painful. It is done with local anesthetic agents. However, the procedure can take many hours, and it is expected that you'll become uncomfortable. We try to minimize any sedating medications, but can give you something if needed.  5. You will have a bad haircut, but it will grow back!  6. Following the surgery, you will stay  overnight in the hospital for observation.  7. The following day, you will have either an MRI brain or a CT brain to allow [place] to evaluate the placement of the electrodes and also to make sure there were no bleeding complications. Provided there are no complications, you will be discharged home the day after surgery.    It is extremely important to remember that after having DBS surgery, you will need to notify your physicians before you have an MRI.  Most DBS electrodes ARE now MRI compatible but the DBS must be placed in a special mode before the MRI is completed for your safety.  8. About 1 week later you will return for an outpatient surgery that lasts 1-2 hours during which the generator(s) will be placed. You will go home on the same day as the surgery. You will find that you are more uncomfortable after this surgery than your first surgery. You will be given medications to help with this. The pain from this surgery usually resolves in 2 or 3 days.

## 2021-02-11 NOTE — Telephone Encounter (Signed)
Labs ok for prolia. Ca is normal and CrCl is 64.53mL/min

## 2021-02-14 ENCOUNTER — Other Ambulatory Visit: Payer: Self-pay

## 2021-02-14 DIAGNOSIS — Z961 Presence of intraocular lens: Secondary | ICD-10-CM | POA: Diagnosis not present

## 2021-02-14 DIAGNOSIS — H401131 Primary open-angle glaucoma, bilateral, mild stage: Secondary | ICD-10-CM | POA: Diagnosis not present

## 2021-02-14 DIAGNOSIS — H5211 Myopia, right eye: Secondary | ICD-10-CM | POA: Diagnosis not present

## 2021-02-14 DIAGNOSIS — H524 Presbyopia: Secondary | ICD-10-CM | POA: Diagnosis not present

## 2021-02-14 DIAGNOSIS — G20 Parkinson's disease: Secondary | ICD-10-CM

## 2021-02-14 DIAGNOSIS — Z9849 Cataract extraction status, unspecified eye: Secondary | ICD-10-CM | POA: Diagnosis not present

## 2021-02-14 DIAGNOSIS — Z01818 Encounter for other preprocedural examination: Secondary | ICD-10-CM

## 2021-02-15 ENCOUNTER — Other Ambulatory Visit: Payer: Self-pay

## 2021-02-15 ENCOUNTER — Other Ambulatory Visit: Payer: Self-pay | Admitting: Neurosurgery

## 2021-02-15 NOTE — Addendum Note (Signed)
Addended by: Ulice Brilliant T on: 02/15/2021 04:06 PM   Modules accepted: Orders

## 2021-02-16 ENCOUNTER — Telehealth: Payer: Self-pay

## 2021-02-16 MED ORDER — DIAZEPAM 5 MG PO TABS: ORAL_TABLET | ORAL | 0 refills | Status: DC

## 2021-02-16 NOTE — Telephone Encounter (Signed)
Valium sent to pharmacy.  She knows she will need a driver.  Tell her to limit amount of levodopa she takes so she is not too "wiggly" on the table but I also don't want her to tremor so she will need some levodopa (she will probably need to take 1.5 tabs)

## 2021-02-16 NOTE — Telephone Encounter (Signed)
Put her on schedule for 5/23 at 1 pm.  Have her hold her Parkinsons Disease meds just that AM and we will do pre-op video that day and discuss remaining issues before surgery.  She will need to bring her son to appt.  [place]

## 2021-02-16 NOTE — Telephone Encounter (Signed)
I made the appt for 04-18-21 at 1:00 for 60 mins with Dr [name]. I left a very detail message with the date of appt and time of appt, about holding medication that AM and the pre op video for patient .

## 2021-02-16 NOTE — Telephone Encounter (Signed)
Spoke with radiology and got patient scheduled for her MRI on 02/28/21 at 11 am with a 10:30am patient arrival. I advised patient that she can use the [place] parking and when she gets to [place] go to the radiology dept. She voiced understanding.   Patient wanted to know when Dr [name] will send her Valium to the pharmacy? Informed patient that I would make Dr [name] aware that she has her appointment scheduled.   Called patient and made her aware. She wanted to know if she would hear from Dr Melven Sartorius office soon. I told her she should and gave her their phone number. I told her if she does not hear from them this week then for her to call them next week.   Sent email to make sure the patient gets a Preop DBS MRI.

## 2021-02-16 NOTE — Telephone Encounter (Signed)
Spoke with patient and made her aware that we have sent over the Valium to the pharmacy. Advised her that she will need a driver. She states her son will be her driver. I gave patient Dr Doristine Devoid other recommendations and she voiced understanding. She wanted to know if she would have a visit with Dr [name] after her MRI. I told her to wait and see what the MRI shows then Dr [name] will make a decision then.   She voiced understanding.

## 2021-02-18 ENCOUNTER — Telehealth: Payer: Self-pay | Admitting: Neurology

## 2021-02-18 DIAGNOSIS — G510 Bell's palsy: Secondary | ICD-10-CM | POA: Diagnosis not present

## 2021-02-18 DIAGNOSIS — L928 Other granulomatous disorders of the skin and subcutaneous tissue: Secondary | ICD-10-CM | POA: Diagnosis not present

## 2021-02-18 DIAGNOSIS — Z8619 Personal history of other infectious and parasitic diseases: Secondary | ICD-10-CM | POA: Diagnosis not present

## 2021-02-18 DIAGNOSIS — H02232 Paralytic lagophthalmos right lower eyelid: Secondary | ICD-10-CM | POA: Diagnosis not present

## 2021-02-18 DIAGNOSIS — H04123 Dry eye syndrome of bilateral lacrimal glands: Secondary | ICD-10-CM | POA: Diagnosis not present

## 2021-02-18 NOTE — Telephone Encounter (Signed)
I think that the hospital will do some type of xray to look before hand.  If there is metal and we cannot do the MRI, we can plan via CT but it makes it much, much harder.  We wont be able to see the structures that we are targeting but if its the best we can do, its what we will deal with

## 2021-02-18 NOTE — Telephone Encounter (Signed)
Spoke with patient and advised her to contact the hospital to see if they can do the MRI. I advised her that Dr [name] may not know because she did not put them on your eye. She states her eye doctor told her he could remove them if he needed to.  She voiced understanding.

## 2021-02-18 NOTE — Telephone Encounter (Signed)
Spoke with Dr [name] and she is agreeable with what I previously told the patient.

## 2021-02-18 NOTE — Telephone Encounter (Signed)
Patient has a weight in her eye and she is not sure if she can have the MRI done with that weight in her eye. I advise her to call the MRI place and they should be able to tell her. But she wanted to let us know as well  Please call

## 2021-02-22 ENCOUNTER — Ambulatory Visit (INDEPENDENT_AMBULATORY_CARE_PROVIDER_SITE_OTHER): Payer: Medicare Other

## 2021-02-22 ENCOUNTER — Other Ambulatory Visit: Payer: Self-pay

## 2021-02-22 DIAGNOSIS — M810 Age-related osteoporosis without current pathological fracture: Secondary | ICD-10-CM

## 2021-02-22 DIAGNOSIS — E538 Deficiency of other specified B group vitamins: Secondary | ICD-10-CM

## 2021-02-22 MED ORDER — DENOSUMAB 60 MG/ML ~~LOC~~ SOSY
60.0000 mg | PREFILLED_SYRINGE | Freq: Once | SUBCUTANEOUS | Status: AC
  Administered 2021-02-22: 60 mg via SUBCUTANEOUS

## 2021-02-22 MED ORDER — CYANOCOBALAMIN 1000 MCG/ML IJ SOLN
1000.0000 ug | Freq: Once | INTRAMUSCULAR | Status: AC
  Administered 2021-02-22: 1000 ug via INTRAMUSCULAR

## 2021-02-22 NOTE — Progress Notes (Signed)
Per orders of Dr. Danise Mina, injection of B12 and Proli given by Randall An. B12 given in L deltoid and Prolia given in R arm.  Patient tolerated injection well.

## 2021-02-24 ENCOUNTER — Ambulatory Visit: Payer: Medicare Other | Admitting: Neurology

## 2021-02-24 ENCOUNTER — Encounter: Payer: Self-pay | Admitting: Gastroenterology

## 2021-02-28 ENCOUNTER — Ambulatory Visit (HOSPITAL_COMMUNITY)
Admission: RE | Admit: 2021-02-28 | Discharge: 2021-02-28 | Disposition: A | Discharge status: Home / Self Care | Payer: Medicare Other | Source: Ambulatory Visit | Attending: Neurology | Admitting: Neurology

## 2021-02-28 ENCOUNTER — Other Ambulatory Visit: Payer: Self-pay | Admitting: Neurology

## 2021-02-28 ENCOUNTER — Other Ambulatory Visit: Payer: Self-pay

## 2021-02-28 DIAGNOSIS — Z01818 Encounter for other preprocedural examination: Secondary | ICD-10-CM | POA: Diagnosis not present

## 2021-02-28 DIAGNOSIS — G20 Parkinson's disease: Secondary | ICD-10-CM | POA: Diagnosis not present

## 2021-02-28 MED ORDER — GADOBUTROL 1 MMOL/ML IV SOLN
7.0000 mL | Freq: Once | INTRAVENOUS | Status: AC | PRN
  Administered 2021-02-28: 7 mL via INTRAVENOUS

## 2021-03-01 ENCOUNTER — Telehealth: Payer: Self-pay

## 2021-03-01 NOTE — Telephone Encounter (Signed)
Patient called in and stated that she was having spasms in both of her shoulders and was wanting to make an appointment with Dr. Lorelei Pont. If patient returns call, please schedule with Dr. Lorelei Pont.

## 2021-03-01 NOTE — Telephone Encounter (Signed)
Rx(s) sent to pharmacy electronically.  

## 2021-03-03 ENCOUNTER — Telehealth: Payer: Self-pay

## 2021-03-03 ENCOUNTER — Ambulatory Visit (INDEPENDENT_AMBULATORY_CARE_PROVIDER_SITE_OTHER): Payer: Medicare Other | Admitting: Family Medicine

## 2021-03-03 ENCOUNTER — Encounter: Payer: Self-pay | Admitting: Family Medicine

## 2021-03-03 ENCOUNTER — Other Ambulatory Visit: Payer: Self-pay

## 2021-03-03 VITALS — BP 90/56 | HR 65 | Temp 97.3°F | Ht 64.0 in | Wt 154.5 lb

## 2021-03-03 DIAGNOSIS — G20A1 Parkinson's disease without dyskinesia, without mention of fluctuations: Secondary | ICD-10-CM

## 2021-03-03 DIAGNOSIS — G20 Parkinson's disease: Secondary | ICD-10-CM | POA: Diagnosis not present

## 2021-03-03 DIAGNOSIS — M62838 Other muscle spasm: Secondary | ICD-10-CM | POA: Diagnosis not present

## 2021-03-03 MED ORDER — CLONAZEPAM 0.5 MG PO TABS: ORAL_TABLET | ORAL | 0 refills | Status: DC

## 2021-03-03 NOTE — Progress Notes (Signed)
[name] T. Adell Panek, MD, [place] at [facility] [place], [address]  Phone: [phone]  FAX: [place] - 75 y.o. female  MRN [id number]  Date of Birth: 03-02-1946  Date: 03/03/2021  PCP: [name], MD  Referral: [name], MD  Chief Complaint  Patient presents with  . Spasms    Bilateral Shoulders    This visit occurred during the SARS-CoV-2 public health emergency.  Safety protocols were in place, including screening questions prior to the visit, additional usage of staff PPE, and extensive cleaning of exam room while observing appropriate contact time as indicated for disinfecting solutions.   Subjective:   [name] is a 75 y.o. very pleasant female patient with Body mass index is 26.52 kg/m. 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  Her left shoulder is actually doing quite a bit better and her motion is much better.  At this point that is not significantly holding her back.  She is having some worsening of her movements in terms of her Parkinson's, and this is throughout the entire upper extremity.  She is scheduled to have deep brain stimulator placement in August.  She is followed by Dr. Carles Collet for her Parkinson's disease.  Review of Systems is noted in the HPI, as appropriate   Objective:   BP (!) 90/56   Pulse 65   Temp (!) 97.3 F (36.3 C) (Temporal)   Ht 5\' 4"  (1.626 m)   Wt 154 lb 8 oz (70.1 kg)   SpO2 93%   BMI 26.52 kg/m   GEN: No acute distress; alert,appropriate. PULM: Breathing comfortably in no respiratory distress PSYCH: Normally interactive.   Neuro: She does have repetitive movement and tremor in throughout the upper  extremities  Bilateral shoulders have good range of motion without restriction.  I am unable to  elicit significant pain with Michel Bickers or Neer testing outside of her ongoing shoulder spasm. Speeds and Yergason's are negative Grip is appropriate Strength testing is roughly 4+/5 in all movements of the upper extremity.  She is notably tender in the posterior trapezius muscles particularly as well as in the mid trap and to a lesser extent the posterior paracervical musculature  Radiology:  Assessment and Plan:     ICD-10-CM   1. Trapezius muscle spasm  M62.838   2. Parkinson's disease ([place])  G20    I am actually pleased with her recovery quickly with her frozen shoulder.  Think that this is a repetitive motion movement due to Parkinson's.  I reviewed some basic trapezius and scapular range of motion and stretching that she can do.  I am not sure if there is a good answer here.  Hopefully she will have improvement after her deep brain stimulator.  I would rather not add additional medication to her regiment.  I am going to send a copy of this note to my partner.  They might be reasonable to see if having some scheduled Klonopin twice a day would help as a functional muscle relaxer for her trapezius.  This can be habit-forming, but in this case I think that this is probably reasonable.  Ultimately, I would like for Dr. Darnell Level to make that long-term decision, but I am happy to discuss further.  Meds ordered this encounter  Medications  .  clonazePAM (KLONOPIN) 0.5 MG tablet    Sig: TAKE 1 TABLET BY MOUTH  TWICE DAILY    Dispense:  60 tablet    Refill:  0   Medications Discontinued During This Encounter  Medication Reason  . diazepam (VALIUM) 5 MG tablet Completed Course  . clonazePAM (KLONOPIN) 0.5 MG tablet Reorder   No orders of the defined types were placed in this encounter.   Follow-up: No follow-ups on file.  Signed,  Maud Deed. Khole Arterburn, MD   Outpatient Encounter Medications as of 03/03/2021  Medication Sig  . AMBULATORY NON FORMULARY MEDICATION Lift chair Dx:  G20   . benzonatate (TESSALON) 200 MG capsule Take 1 capsule (200 mg total) by mouth 3 (three) times daily as needed for cough.  . Calcium Carbonate-Vitamin D (CALCIUM 600/VITAMIN D) 600-400 MG-UNIT chew tablet Chew 1 tablet by mouth daily.  . carbidopa-levodopa (SINEMET CR) 50-200 MG tablet TAKE 1 TABLET BY MOUTH AT  BEDTIME  . carbidopa-levodopa (SINEMET IR) 25-100 MG tablet 2 tablet at 6am/1 tablet at 9:30am/1pm/4:30pm.  May take an extra 1/2 tablet prn evening  . Cholecalciferol (VITAMIN D3) 25 MCG (1000 UT) CAPS Take 1 capsule (1,000 Units total) by mouth daily.  . cyanocobalamin (,VITAMIN B-12,) 1000 MCG/ML injection INJECT 1 ML (1,000 MCG TOTAL) INTO THE MUSCLE EVERY 30 (THIRTY) DAYS.  Marland Kitchen gabapentin (NEURONTIN) 100 MG capsule Take 2 capsules (200 mg total) by mouth at bedtime.  . polyethylene glycol (MIRALAX / GLYCOLAX) packet Take 17 g by mouth as needed.  . pramipexole (MIRAPEX) 0.5 MG tablet Take 1 tablet (0.5 mg total) by mouth 3 (three) times daily.  . sertraline (ZOLOFT) 100 MG tablet Take 1 tablet (100 mg total) by mouth daily.  . traMADol (ULTRAM) 50 MG tablet Take 1 tablet (50 mg total) by mouth 3 (three) times daily as needed.  . triamcinolone cream (KENALOG) 0.1 % APPLY TO AFFECTED AREA TWICE A DAY  . [DISCONTINUED] clonazePAM (KLONOPIN) 0.5 MG tablet TAKE 1 TABLET BY MOUTH  TWICE DAILY AS NEEDED  . clonazePAM (KLONOPIN) 0.5 MG tablet TAKE 1 TABLET BY MOUTH  TWICE DAILY  . [DISCONTINUED] diazepam (VALIUM) 5 MG tablet 1 tablet 1 hour prior to procedure; may repeat 15 min prior to procedure if needed   No facility-administered encounter medications on file as of 03/03/2021.

## 2021-03-03 NOTE — Telephone Encounter (Signed)
Received a call back from patient stating she just got home from having her MRI. I told her I was calling to see if she needed a rx for the MRI. She stated she had one Valium left and she took that before her MRI she states the tech told her she did good and was very still. I adivised patient that once Dr [name] reviewed her MRI I would call her back with the results. She voiced understanding.

## 2021-03-03 NOTE — Telephone Encounter (Signed)
Left patient a message to see if a rx for Valium is needed before her MRI. Then I realized her MRI was on Monday 02/28/21 but will be repeated today.

## 2021-03-03 NOTE — Telephone Encounter (Signed)
Left message for patients son to contact office in regards to his mom.

## 2021-03-23 ENCOUNTER — Telehealth: Payer: Self-pay

## 2021-03-23 NOTE — Telephone Encounter (Signed)
Patient is not due for B12 injection until after 4/30. Called and LVM for patient to return call to R/S.

## 2021-03-24 ENCOUNTER — Ambulatory Visit: Payer: Medicare Other

## 2021-03-24 ENCOUNTER — Other Ambulatory Visit: Payer: Self-pay

## 2021-03-30 DIAGNOSIS — G20 Parkinson's disease: Secondary | ICD-10-CM | POA: Diagnosis not present

## 2021-04-05 DIAGNOSIS — H02834 Dermatochalasis of left upper eyelid: Secondary | ICD-10-CM | POA: Diagnosis not present

## 2021-04-05 DIAGNOSIS — H02232 Paralytic lagophthalmos right lower eyelid: Secondary | ICD-10-CM | POA: Diagnosis not present

## 2021-04-05 DIAGNOSIS — H02231 Paralytic lagophthalmos right upper eyelid: Secondary | ICD-10-CM | POA: Diagnosis not present

## 2021-04-05 DIAGNOSIS — H04123 Dry eye syndrome of bilateral lacrimal glands: Secondary | ICD-10-CM | POA: Diagnosis not present

## 2021-04-05 DIAGNOSIS — H02831 Dermatochalasis of right upper eyelid: Secondary | ICD-10-CM | POA: Diagnosis not present

## 2021-04-06 ENCOUNTER — Other Ambulatory Visit: Payer: Self-pay | Admitting: Family Medicine

## 2021-04-06 MED ORDER — TRAMADOL HCL 50 MG PO TABS: 50.0000 mg | ORAL_TABLET | Freq: Three times a day (TID) | ORAL | 0 refills | Status: DC | PRN

## 2021-04-06 MED ORDER — CLONAZEPAM 0.5 MG PO TABS: ORAL_TABLET | ORAL | 0 refills | Status: DC

## 2021-04-06 NOTE — Telephone Encounter (Signed)
Last office visit 03/03/21 Dr. Lorelei Pont Last refill Tramadol 02/02/21 #50 Last refill Clonazepam 03/03/21 #60

## 2021-04-06 NOTE — Telephone Encounter (Signed)
ERx 

## 2021-04-06 NOTE — Telephone Encounter (Signed)
  LAST APPOINTMENT DATE: 03/23/2021   NEXT APPOINTMENT DATE:@6 /11/2020  MEDICATION: Tramadol and Clonazepam  PHARMACY: [facility] [place],   Let patient know to contact pharmacy at the end of the day to make sure medication is ready.  Please notify patient to allow 48-72 hours to process  Encourage patient to contact the pharmacy for refills or they can request refills through [place]:   LAST REFILL:  QTY:  REFILL DATE:    OTHER COMMENTS:    Okay for refill?  Please advise

## 2021-04-13 DIAGNOSIS — H04123 Dry eye syndrome of bilateral lacrimal glands: Secondary | ICD-10-CM | POA: Diagnosis not present

## 2021-04-13 DIAGNOSIS — H02231 Paralytic lagophthalmos right upper eyelid: Secondary | ICD-10-CM | POA: Diagnosis not present

## 2021-04-13 DIAGNOSIS — H02834 Dermatochalasis of left upper eyelid: Secondary | ICD-10-CM | POA: Diagnosis not present

## 2021-04-13 DIAGNOSIS — H02831 Dermatochalasis of right upper eyelid: Secondary | ICD-10-CM | POA: Diagnosis not present

## 2021-04-13 DIAGNOSIS — H02232 Paralytic lagophthalmos right lower eyelid: Secondary | ICD-10-CM | POA: Diagnosis not present

## 2021-04-13 DIAGNOSIS — H02841 Edema of right upper eyelid: Secondary | ICD-10-CM | POA: Diagnosis not present

## 2021-04-15 ENCOUNTER — Encounter: Payer: Self-pay | Admitting: *Deleted

## 2021-04-15 ENCOUNTER — Telehealth: Payer: Self-pay | Admitting: Neurology

## 2021-04-15 NOTE — Telephone Encounter (Signed)
Sent [facility] message instructions regarding medication by Dr. Carles Collet.

## 2021-04-15 NOTE — Telephone Encounter (Signed)
-----   Message from [place], DO sent at 02/16/2021 12:19 PM EDT ----- Call patient and tell her to hold her Parkinsons Disease meds Monday AM before the visit

## 2021-04-15 NOTE — Patient Instructions (Addendum)
On August 4, decrease pramipexole to 0.5 mg twice per day On August 6, decrease pramipexole to 0.5 mg once per day On August 9, STOP pramipexole On August 9, you may take your bedtime dose of carbidopa/levodopa 50/200 CR and then no levodopa until AFTER surgery (no pramipexole or carbidopa/levodopa on 8/10 or 8/11)  I will see you on 9/12 at 1pm.  HOLD your carbidopa/levodopa 25/100 and your pramipexole for that visit. I will see you on 9/26 at 1pm.  You can take your meds for this visit.

## 2021-04-15 NOTE — Progress Notes (Signed)
Assessment/Plan:    1.  Parkinsons Disease             -takecarbidopa/levodopa 25/100,2tablet at 6am/ and 1 tablet 9:30am/1pm/4:30pm. May take an extra 1/2 tablet if needed in the evening.  Discussed that covid may increase sx's -Continue carbidopa/levodopa 50/200 at bedtime -Continue pramipexole 0.5 mg 3 times per day -Patient has completed neurocognitive testing on January 25, 2021 with Dr. Nicole Kindred.  Evidence of MCI only.  -Levodopa challenge test was done and demonstrated marked benefit to levodopa.  She did get fairly dyskinetic after the levodopa.             -Preop video has been completed             -Preop MRI has been completed and films are adequate for planning.             -I talked to the patient about the logistics associated with DBS therapy.  I talked to the patient about risks/benefits/side effects of DBS therapy.  We talked about risks which included but were not limited to infection, paralysis, intraoperative seizure, death, stroke, bleeding around the electrode.   I talked to patient about fiducial placement 1 week prior to DBS therapy.  I talked to the patient about what to expect in the operating room, including the fact that this is an awake surgery.  We talked about battery placement as well as which is done under general anesthesia, generally approximately one week following the initial surgery.  We also talked about the fact that the patient will need to be off of medications for surgery.  The patient and family were given the opportunity to ask questions, which they did, and I answered them to the best of my ability today.  We will plan on starting L STN.  [place] device.               -Pt has met with Dr. Vertell Limber  -Surgery scheduled for August 11.             -Postoperative appointments are given.             -Patient given a preop weaning schedule to wean off medication prior to surgery.  Pt can restart medication post  operatively.   2. Dyskinesia -Now off of amantadine because of hallucinations. -Have reduced pramipexole because of significant dyskinesia as well. 3. Neurogenic Orthostatic Hypotension -Did well on Northera. Patient stopped that on her own. We will need to watch that. Denies lightheaded now 4.Depression -On sertraline -Declines counseling 5. b12 deficiency -on injections. 6. Urinary incontinence -on myrbetriq. She is doing well in that regard 7. Lumbar radiculopathy -Sheis status post lumbar foraminotomy with Dr. Vertell Limber on November 27, 2019 9. 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  My previous records were reviewed prior to todays visit as well as outside records available to me. Pt with son who supplements hx.   Pt denies falls.  Pt denies lightheadedness, near syncope.  No hallucinations.  Mood has been good.    Patient was supposed to be off of levodopa for todays visit for pre-op video but didn't do that (said she didn't realize that).  Current prescribed movement disorder medications: carbidopa/levodopa 25/100,2tablet at 6am/ and 1 tablet 9:30am/1pm/4:30pm  Carbidopa/levodopa 50/200 at bedtime Pramipexole0.5 mg 3 times per day Sertraline, 100 mg daily  PREVIOUS MEDICATIONS:  northera (worked well but pt d/c when they quit sending b/c she owed $);amantadine (discontinued because of hallucinations);pramipexole (decreased in past because of hallucinations/dyskinesia)  ALLERGIES:   Allergies  Allergen Reactions  . Amitriptyline Other (See Comments)    Sedated next morning  . Ciprofloxacin Nausea And Vomiting  . Cymbalta [Duloxetine Hcl] Other (See Comments)    Worsened depression  . Imipramine Hcl      REACTION: rash  . Iohexol      Code: HIVES, Desc: PT developed 2 hives, followed by SOB, severe headache post 87cc's Omnipaque 300., Onset Date: [id number]   . Lyrica [Pregabalin] Other (See Comments)    Tried during hospitalization - unsure effects but unable to tolerate  . Morphine Sulfate     REACTION: rash  . Sulfamethoxazole     REACTION: rash  . Iodine Rash  . Lidocaine Hcl Rash  . Neosporin [Neomycin-Bacitracin Zn-Polymyx] Rash    Worsened skin breaking out  . Tetracyclines & Related Rash    CURRENT MEDICATIONS:  Outpatient Encounter Medications as of 04/18/2021  Medication Sig  . AMBULATORY NON FORMULARY MEDICATION Lift chair Dx:  G20  . Calcium Carbonate-Vitamin D (CALCIUM 600/VITAMIN D) 600-400 MG-UNIT chew tablet Chew 1 tablet by mouth daily.  . carbidopa-levodopa (SINEMET CR) 50-200 MG tablet TAKE 1 TABLET BY MOUTH AT  BEDTIME  . carbidopa-levodopa (SINEMET IR) 25-100 MG tablet 2 tablet at 6am/1 tablet at 9:30am/1pm/4:30pm.  May take an extra 1/2 tablet prn evening  . Cholecalciferol (VITAMIN D3) 25 MCG (1000 UT) CAPS Take 1 capsule (1,000 Units total) by mouth daily.  . clonazePAM (KLONOPIN) 0.5 MG tablet TAKE 1 TABLET BY MOUTH  TWICE DAILY  . cyanocobalamin (,VITAMIN B-12,) 1000 MCG/ML injection INJECT 1 ML (1,000 MCG TOTAL) INTO THE MUSCLE EVERY 30 (THIRTY) DAYS.  Marland Kitchen gabapentin (NEURONTIN) 100 MG capsule Take 2 capsules (200 mg total) by mouth at bedtime.  . polyethylene glycol (MIRALAX / GLYCOLAX) packet Take 17 g by mouth as needed.  . pramipexole (MIRAPEX) 0.5 MG tablet Take 1 tablet (0.5 mg total) by mouth 3 (three) times daily.  . sertraline (ZOLOFT) 100 MG tablet Take 1 tablet (100 mg total) by mouth daily.  . traMADol (ULTRAM) 50 MG tablet Take 1 tablet (50 mg total) by mouth 3 (three) times daily as needed.  . triamcinolone cream (KENALOG) 0.1 % APPLY TO AFFECTED AREA TWICE A DAY  . benzonatate (TESSALON) 200 MG capsule Take 1 capsule (200 mg total) by mouth 3  (three) times daily as needed for cough. (Patient not taking: Reported on 04/18/2021)   No facility-administered encounter medications on file as of 04/18/2021.    Objective:   PHYSICAL EXAMINATION:    VITALS:   Vitals:   04/18/21 1301  BP: 112/70  Pulse: 78  Resp: 18  SpO2: 95%  Weight: 149 lb (67.6 kg)    100% of visit in counseling today    Chemistry      Component Value Date/Time   NA 138 02/08/2021 1101   K 4.2 02/08/2021 1101   CL 102 02/08/2021 1101   CO2 30 02/08/2021 1101   BUN 16 02/08/2021 1101   CREATININE 0.87 02/08/2021 1101   CREATININE 0.87 10/08/2019 0921      Component Value Date/Time   CALCIUM 9.7 02/08/2021 1101   ALKPHOS 54 10/25/2020 1030   AST 13 10/25/2020 1030   ALT 3 10/25/2020 1030   BILITOT 0.4 10/25/2020 1030       Lab Results  Component Value  Date   WBC 3.8 (L) 10/25/2020   HGB 11.2 (L) 10/25/2020   HCT 33.8 (L) 10/25/2020   MCV 87.2 10/25/2020   PLT 248.0 10/25/2020    Lab Results  Component Value Date   TSH 0.49 10/04/2018     Total time spent on today's visit was 41 minutes, including both face-to-face time and nonface-to-face time.  Time included that spent on review of records (prior notes available to me/labs/imaging if pertinent), discussing treatment and goals, answering patient's questions and coordinating care.  This did not include the wait time waiting for levodopa to kick in.  Cc:  [name], MD

## 2021-04-15 NOTE — Telephone Encounter (Signed)
LVM--to call the office back regarding  medication. 

## 2021-04-18 ENCOUNTER — Encounter: Payer: Self-pay | Admitting: Neurology

## 2021-04-18 ENCOUNTER — Other Ambulatory Visit: Payer: Self-pay

## 2021-04-18 ENCOUNTER — Ambulatory Visit: Payer: Medicare Other | Admitting: Neurology

## 2021-04-18 VITALS — BP 112/70 | HR 78 | Resp 18 | Wt 149.0 lb

## 2021-04-18 DIAGNOSIS — G20 Parkinson's disease: Secondary | ICD-10-CM | POA: Diagnosis not present

## 2021-04-20 DIAGNOSIS — H02841 Edema of right upper eyelid: Secondary | ICD-10-CM | POA: Diagnosis not present

## 2021-04-20 DIAGNOSIS — H02231 Paralytic lagophthalmos right upper eyelid: Secondary | ICD-10-CM | POA: Diagnosis not present

## 2021-04-20 DIAGNOSIS — H04123 Dry eye syndrome of bilateral lacrimal glands: Secondary | ICD-10-CM | POA: Diagnosis not present

## 2021-04-20 DIAGNOSIS — H02232 Paralytic lagophthalmos right lower eyelid: Secondary | ICD-10-CM | POA: Diagnosis not present

## 2021-04-26 ENCOUNTER — Other Ambulatory Visit (HOSPITAL_COMMUNITY): Payer: Self-pay | Admitting: Neurosurgery

## 2021-04-26 ENCOUNTER — Other Ambulatory Visit: Payer: Self-pay | Admitting: Neurosurgery

## 2021-04-26 DIAGNOSIS — G20 Parkinson's disease: Secondary | ICD-10-CM

## 2021-04-27 ENCOUNTER — Encounter: Payer: Self-pay | Admitting: Family Medicine

## 2021-04-27 ENCOUNTER — Other Ambulatory Visit: Payer: Self-pay

## 2021-04-27 ENCOUNTER — Ambulatory Visit (INDEPENDENT_AMBULATORY_CARE_PROVIDER_SITE_OTHER): Payer: Medicare Other | Admitting: Family Medicine

## 2021-04-27 VITALS — BP 122/68 | HR 74 | Temp 97.5°F | Ht 64.0 in | Wt 149.5 lb

## 2021-04-27 DIAGNOSIS — G3184 Mild cognitive impairment, so stated: Secondary | ICD-10-CM

## 2021-04-27 DIAGNOSIS — F0670 Mild neurocognitive disorder due to known physiological condition without behavioral disturbance: Secondary | ICD-10-CM

## 2021-04-27 DIAGNOSIS — G903 Multi-system degeneration of the autonomic nervous system: Secondary | ICD-10-CM | POA: Diagnosis not present

## 2021-04-27 DIAGNOSIS — E538 Deficiency of other specified B group vitamins: Secondary | ICD-10-CM

## 2021-04-27 DIAGNOSIS — B0052 Herpesviral keratitis: Secondary | ICD-10-CM | POA: Diagnosis not present

## 2021-04-27 DIAGNOSIS — H02231 Paralytic lagophthalmos right upper eyelid: Secondary | ICD-10-CM

## 2021-04-27 DIAGNOSIS — G20 Parkinson's disease: Secondary | ICD-10-CM

## 2021-04-27 HISTORY — PX: COLONOSCOPY: SHX174

## 2021-04-27 MED ORDER — CYANOCOBALAMIN 1000 MCG/ML IJ SOLN
1000.0000 ug | Freq: Once | INTRAMUSCULAR | Status: AC
  Administered 2021-04-27: 1000 ug via INTRAMUSCULAR

## 2021-04-27 NOTE — Patient Instructions (Addendum)
b12 shot today  Consider booster this month prior to upcoming planned surgeries.  Good to see you today Return in 6 months for medicare wellness visit/physical.

## 2021-04-27 NOTE — Assessment & Plan Note (Signed)
Closely followed by Dr Carles Collet - planned DBS placement later this summer. She is somewhat nervous but also excited about upcoming treatment. Discussed recommended precautions in setting of pandemic especially perioperatively.

## 2021-04-27 NOTE — Assessment & Plan Note (Signed)
B12 shot today - continue monthly replacement.

## 2021-04-27 NOTE — Assessment & Plan Note (Signed)
Reviewed recent neuropsychological assessment - evidence of MCI.

## 2021-04-27 NOTE — Assessment & Plan Note (Signed)
Appreciate plastic surgery care and recent surgeries.

## 2021-04-27 NOTE — Assessment & Plan Note (Addendum)
Has lost significant vision to R eye.  L eye vision preserved.

## 2021-04-27 NOTE — Progress Notes (Signed)
Patient ID: [name], female    DOB: 07/25/1946, 75 y.o.   MRN: [id number]  This visit was conducted in person.  BP 122/68   Pulse 74   Temp (!) 97.5 F (36.4 C) (Temporal)   Ht 5\' 4"  (1.626 m)   Wt 149 lb 8 oz (67.8 kg)   SpO2 97%   BMI 25.66 kg/m    CC: 6 mo f/u visit  Subjective:   HPI: [name] is a 75 y.o. female presenting on 04/27/2021 for Follow-up (Here for 6 mo f/u.)   Parkinson disease - planning DBS, upcoming neurosurgeries for stimulator placement 06/2021. 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 Notes some orthostatic dizziness. No syncope or falls. She regularly uses walker or cane. Continued resting tremor.   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 Gold weight recently placed will be removed at end of the month. L eye recovered well, good vision.   B12 deficiency - continues monthly B12 shots, last shot 02/22/2021.      Relevant past medical, surgical, family and social history reviewed and updated as indicated. Interim medical history since our last visit reviewed. Allergies and medications reviewed and updated. Outpatient Medications Prior to Visit  Medication Sig Dispense Refill  . AMBULATORY NON FORMULARY MEDICATION Lift chair Dx:  G20 1 Device 0  . benzonatate (TESSALON) 200 MG capsule Take 1 capsule (200 mg total) by mouth 3 (three) times daily as needed for cough. 15 capsule 0  . Calcium Carbonate-Vitamin D (CALCIUM 600/VITAMIN D) 600-400 MG-UNIT chew tablet Chew 1 tablet by mouth daily.    . carbidopa-levodopa (SINEMET CR) 50-200 MG tablet TAKE 1 TABLET BY MOUTH AT  BEDTIME 90 tablet 0  . carbidopa-levodopa (SINEMET IR) 25-100 MG tablet 2 tablet at 6am/1 tablet at 9:30am/1pm/4:30pm.  May take an extra 1/2 tablet prn evening 540 tablet 1  . Cholecalciferol (VITAMIN D3) 25 MCG (1000 UT) CAPS Take 1 capsule (1,000 Units total)  by mouth daily. 30 capsule   . clonazePAM (KLONOPIN) 0.5 MG tablet TAKE 1 TABLET BY MOUTH  TWICE DAILY 60 tablet 0  . cyanocobalamin (,VITAMIN B-12,) 1000 MCG/ML injection INJECT 1 ML (1,000 MCG TOTAL) INTO THE MUSCLE EVERY 30 (THIRTY) DAYS. 3 mL 2  . gabapentin (NEURONTIN) 100 MG capsule Take 2 capsules (200 mg total) by mouth at bedtime. 180 capsule 3  . polyethylene glycol (MIRALAX / GLYCOLAX) packet Take 17 g by mouth as needed. 30 each 1  . pramipexole (MIRAPEX) 0.5 MG tablet Take 1 tablet (0.5 mg total) by mouth 3 (three) times daily. 270 tablet 1  . sertraline (ZOLOFT) 100 MG tablet Take 1 tablet (100 mg total) by mouth daily. 90 tablet 1  . traMADol (ULTRAM) 50 MG tablet Take 1 tablet (50 mg total) by mouth 3 (three) times daily as needed. 50 tablet 0  . triamcinolone cream (KENALOG) 0.1 % APPLY TO AFFECTED AREA TWICE A DAY 45 g 1   No facility-administered medications prior to visit.     Per HPI unless specifically indicated in ROS section below Review of Systems Objective:  BP 122/68   Pulse 74   Temp (!) 97.5 F (36.4 C) (Temporal)   Ht 5\' 4"  (1.626 m)   Wt 149 lb 8 oz (67.8 kg)   SpO2 97%   BMI 25.66 kg/m   Wt Readings from Last 3 Encounters:  04/27/21 149 lb 8 oz (  67.8 kg)  04/18/21 149 lb (67.6 kg)  03/03/21 154 lb 8 oz (70.1 kg)      Physical Exam Vitals and nursing note reviewed.  Constitutional:      Appearance: Normal appearance. She is not ill-appearing.  Cardiovascular:     Rate and Rhythm: Normal rate and regular rhythm.     Pulses: Normal pulses.     Heart sounds: Normal heart sounds. No murmur heard.   Pulmonary:     Effort: Pulmonary effort is normal. No respiratory distress.     Breath sounds: Normal breath sounds. No wheezing, rhonchi or rales.  Musculoskeletal:     Right lower leg: No edema.     Left lower leg: No edema.  Skin:    General: Skin is warm and dry.     Findings: No rash.  Neurological:     Mental Status: She is alert.   Psychiatric:        Mood and Affect: Mood normal.        Behavior: Behavior normal.       Results for orders placed or performed in visit on 07/11/47  Basic Metabolic Panel  Result Value Ref Range   Sodium 138 135 - 145 mEq/L   Potassium 4.2 3.5 - 5.1 mEq/L   Chloride 102 96 - 112 mEq/L   CO2 30 19 - 32 mEq/L   Glucose, Bld 87 70 - 99 mg/dL   BUN 16 6 - 23 mg/dL   Creatinine, Ser 0.87 0.40 - 1.20 mg/dL   GFR 66.47 >60.00 mL/min   Calcium 9.7 8.4 - 10.5 mg/dL   Assessment & Plan:  This visit occurred during the SARS-CoV-2 public health emergency.  Safety protocols were in place, including screening questions prior to the visit, additional usage of staff PPE, and extensive cleaning of exam room while observing appropriate contact time as indicated for disinfecting solutions.   Problem List Items Addressed This Visit    Parkinson's disease ([place]) - Primary (Chronic)    Closely followed by Dr Carles Collet - planned DBS placement later this summer. She is somewhat nervous but also excited about upcoming treatment. Discussed recommended precautions in setting of pandemic especially perioperatively.       Vitamin B12 deficiency    B12 shot today - continue monthly replacement.       Orthostatic hypotension due to Parkinson's disease ([place])    Notes ongoing orthostatic dizziness.  Northera became unaffordable.  Now regularly using walker.  Will continue to monitor.       Herpes simplex keratitis of right eye    Has lost significant vision to R eye.  L eye vision preserved.       Paralytic lagophthalmos of right eye    Appreciate plastic surgery care and recent surgeries.       Mild neurocognitive disorder due to Parkinson's disease [facility])    Reviewed recent neuropsychological assessment - evidence of MCI.           Meds ordered this encounter  Medications  . cyanocobalamin ((VITAMIN B-12)) injection 1,000 mcg   No orders of the defined types were placed in this  encounter.   Patient Instructions  b12 shot today  Consider booster this month prior to upcoming planned surgeries.  Good to see you today Return in 6 months for medicare wellness visit/physical.   Follow up plan: Return in about 6 months (around 10/27/2021) for medicare wellness visit, annual exam, prior fasting for blood work.  [name], MD

## 2021-04-27 NOTE — Assessment & Plan Note (Addendum)
Notes ongoing orthostatic dizziness.  Northera became unaffordable.  Now regularly using walker.  Will continue to monitor.

## 2021-04-28 ENCOUNTER — Ambulatory Visit (AMBULATORY_SURGERY_CENTER): Payer: Self-pay | Admitting: *Deleted

## 2021-04-28 VITALS — Ht 66.0 in | Wt 151.0 lb

## 2021-04-28 DIAGNOSIS — Z86010 Personal history of colonic polyps: Secondary | ICD-10-CM

## 2021-04-28 MED ORDER — PEG 3350-KCL-NA BICARB-NACL 420 G PO SOLR: 4000.0000 mL | Freq: Once | ORAL | 0 refills | Status: AC

## 2021-04-28 NOTE — Progress Notes (Signed)
Pt states the only complication from anesthesia is PONV  Denies being told they were difficult to intubate, or hx/fam hx of malignant hyperthermia per pt   No egg or soy allergy  No home oxygen use   No medications for weight loss taken  2 day prep given per Dr. Fuller Plan  Pt informed that we do not do prior authorizations for prep

## 2021-05-04 ENCOUNTER — Other Ambulatory Visit: Payer: Self-pay | Admitting: Family Medicine

## 2021-05-09 ENCOUNTER — Other Ambulatory Visit: Payer: Self-pay | Admitting: Family Medicine

## 2021-05-09 NOTE — Telephone Encounter (Signed)
Name of Medication: Clonazepam, Tramadol Name of Pharmacy: [facility] Last Fill or Written Date and Quantity:       Clonazepam- #60      Tramadol- #50 Last Office Visit and Type: 04/27/21, 6 mo f/u Next Office Visit and Type: 11/02/21, AWV prt 2 Last Controlled Substance Agreement Date: 04/28/16 Last UDS: 04/28/16

## 2021-05-11 ENCOUNTER — Other Ambulatory Visit: Payer: Self-pay | Admitting: Family Medicine

## 2021-05-11 ENCOUNTER — Encounter: Payer: Self-pay | Admitting: Gastroenterology

## 2021-05-11 MED ORDER — CLONAZEPAM 0.5 MG PO TABS: ORAL_TABLET | ORAL | 0 refills | Status: DC

## 2021-05-11 MED ORDER — TRAMADOL HCL 50 MG PO TABS: 50.0000 mg | ORAL_TABLET | Freq: Three times a day (TID) | ORAL | 0 refills | Status: DC | PRN

## 2021-05-11 NOTE — Telephone Encounter (Signed)
Patient called to follow up on the refills. Patient is out of both medications. Please update patient when possible.

## 2021-05-11 NOTE — Telephone Encounter (Signed)
ERx 

## 2021-05-12 ENCOUNTER — Encounter: Payer: Self-pay | Admitting: Gastroenterology

## 2021-05-12 ENCOUNTER — Ambulatory Visit (AMBULATORY_SURGERY_CENTER): Payer: Medicare Other | Admitting: Gastroenterology

## 2021-05-12 ENCOUNTER — Other Ambulatory Visit: Payer: Self-pay

## 2021-05-12 VITALS — BP 126/72 | HR 75 | Temp 96.6°F | Resp 23 | Ht 66.0 in | Wt 151.0 lb

## 2021-05-12 DIAGNOSIS — D120 Benign neoplasm of cecum: Secondary | ICD-10-CM

## 2021-05-12 DIAGNOSIS — Z86010 Personal history of colonic polyps: Secondary | ICD-10-CM | POA: Diagnosis not present

## 2021-05-12 DIAGNOSIS — D122 Benign neoplasm of ascending colon: Secondary | ICD-10-CM | POA: Diagnosis not present

## 2021-05-12 DIAGNOSIS — Z860101 Personal history of adenomatous and serrated colon polyps: Secondary | ICD-10-CM

## 2021-05-12 MED ORDER — SODIUM CHLORIDE 0.9 % IV SOLN: 500.0000 mL | Freq: Once | INTRAVENOUS | Status: DC

## 2021-05-12 NOTE — Progress Notes (Signed)
Pt had some swelling of right eyelid on arrival to PACU. Per CRNA it looks the same as it did prior to the start of the procedure.

## 2021-05-12 NOTE — Progress Notes (Signed)
Report to PACU, RN, vss, BBS= Clear.  

## 2021-05-12 NOTE — Progress Notes (Signed)
Pt's states no medical or surgical changes since previsit or office visit. 

## 2021-05-12 NOTE — Progress Notes (Signed)
Called to room to assist during endoscopic procedure.  Patient ID and intended procedure confirmed with present staff. Received instructions for my participation in the procedure from the performing physician.  

## 2021-05-12 NOTE — Telephone Encounter (Signed)
Duplicate requests

## 2021-05-12 NOTE — Patient Instructions (Signed)
Handout provided on polyps.   No aspirin, ibuprofen, naproxen, or other non-steriodal anti-inflammatory drugs (NSAIDs) for 2 weeks after polyp removal. You may use Tylenol if needed for mild pain or fever.    YOU HAD AN ENDOSCOPIC PROCEDURE TODAY AT [facility]:   Refer to the procedure report that was given to you for any specific questions about what was found during the examination.  If the procedure report does not answer your questions, please call your gastroenterologist to clarify.  If you requested that your care partner not be given the details of your procedure findings, then the procedure report has been included in a sealed envelope for you to review at your convenience later.  YOU SHOULD EXPECT: Some feelings of bloating in the abdomen. Passage of more gas than usual.  Walking can help get rid of the air that was put into your GI tract during the procedure and reduce the bloating. If you had a lower endoscopy (such as a colonoscopy or flexible sigmoidoscopy) you may notice spotting of blood in your stool or on the toilet paper. If you underwent a bowel prep for your procedure, you may not have a normal bowel movement for a few days.  Please Note:  You might notice some irritation and congestion in your nose or some drainage.  This is from the oxygen used during your procedure.  There is no need for concern and it should clear up in a day or so.  SYMPTOMS TO REPORT IMMEDIATELY:  Following lower endoscopy (colonoscopy or flexible sigmoidoscopy):  Excessive amounts of blood in the stool  Significant tenderness or worsening of abdominal pains  Swelling of the abdomen that is new, acute  Fever of 100F or higher  For urgent or emergent issues, a gastroenterologist can be reached at any hour by calling [phone]. Do not use MyChart messaging for urgent concerns.    DIET:  We do recommend a small meal at first, but then you may proceed to your regular diet.  Drink  plenty of fluids but you should avoid alcoholic beverages for 24 hours.  ACTIVITY:  You should plan to take it easy for the rest of today and you should NOT DRIVE or use heavy machinery until tomorrow (because of the sedation medicines used during the test).    FOLLOW UP: Our staff will call the number listed on your records 48-72 hours following your procedure to check on you and address any questions or concerns that you may have regarding the information given to you following your procedure. If we do not reach you, we will leave a message.  We will attempt to reach you two times.  During this call, we will ask if you have developed any symptoms of COVID 19. If you develop any symptoms (ie: fever, flu-like symptoms, shortness of breath, cough etc.) before then, please call [phone].  If you test positive for Covid 19 in the 2 weeks post procedure, please call and report this information to [place].    If any biopsies were taken you will be contacted by phone or by letter within the next 1-3 weeks.  Please call us at [phone] if you have not heard about the biopsies in 3 weeks.    SIGNATURES/CONFIDENTIALITY: You and/or your care partner have signed paperwork which will be entered into your electronic medical record.  These signatures attest to the fact that that the information above on your After Visit Summary has been reviewed and is understood.  Full responsibility of the confidentiality of this discharge information lies with you and/or your care-partner.

## 2021-05-12 NOTE — Op Note (Signed)
[place] Patient Name: [name] Procedure Date: 05/12/2021 10:24 AM MRN: [id number] Endoscopist: Ladene Artist , MD Age: 75 Referring MD:  Date of Birth: 10/17/1946 Gender: Female Account #: [id number] Procedure:                Colonoscopy Indications:              Surveillance: History of numerous (> 10) adenomas                            on last colonoscopy (< 3 yrs) Medicines:                Monitored Anesthesia Care Procedure:                Pre-Anesthesia Assessment:                           - Prior to the procedure, a History and Physical                            was performed, and patient medications and                            allergies were reviewed. The patient's tolerance of                            previous anesthesia was also reviewed. The risks                            and benefits of the procedure and the sedation                            options and risks were discussed with the patient.                            All questions were answered, and informed consent                            was obtained. Prior Anticoagulants: The patient has                            taken no previous anticoagulant or antiplatelet                            agents. ASA Grade Assessment: II - A patient with                            mild systemic disease. After reviewing the risks                            and benefits, the patient was deemed in                            satisfactory condition to undergo the procedure.  After obtaining informed consent, the colonoscope                            was passed under direct vision. Throughout the                            procedure, the patient's blood pressure, pulse, and                            oxygen saturations were monitored continuously. The                            Olympus CF-HQ190L [id number] [id number]) Colonoscope was                            introduced through the anus and  advanced to the the                            cecum, identified by appendiceal orifice and                            ileocecal valve. The ileocecal valve, appendiceal                            orifice, and rectum were photographed. The quality                            of the bowel preparation was adequate. The                            colonoscopy was somewhat difficult due to                            significant looping and a tortuous colon. The                            patient tolerated the procedure well. Scope In: 10:40:37 AM Scope Out: 11:04:29 AM Scope Withdrawal Time: 0 hours 17 minutes 7 seconds  Total Procedure Duration: 0 hours 23 minutes 52 seconds  Findings:                 The perianal and digital rectal examinations were                            normal.                           A 12 mm polyp was found in the cecum. The polyp was                            sessile. The polyp was removed with a hot snare.                            Resection and retrieval were complete.  Five sessile polyps were found in the ascending                            colon and cecum. The polyps were 4 to 7 mm in size.                            These polyps were removed with a cold snare.                            Resection and retrieval were complete.                           The exam was otherwise without abnormality on                            direct and retroflexion views. Complications:            No immediate complications. Estimated blood loss:                            None. Estimated Blood Loss:     Estimated blood loss: none. Impression:               - One 12 mm polyp in the cecum, removed with a hot                            snare. Resected and retrieved.                           - Five 4 to 7 mm polyps in the ascending colon (4)                            and in the cecum (1), removed with a cold snare.                            Resected  and retrieved.                           - The examination was otherwise normal on direct                            and retroflexion views. Recommendation:           - Repeat colonoscopy after studies are complete for                            surveillance based on pathology results.                           - Patient has a contact number available for                            emergencies. The signs and symptoms of potential  delayed complications were discussed with the                            patient. Return to normal activities tomorrow.                            Written discharge instructions were provided to the                            patient.                           - Resume previous diet.                           - Continue present medications.                           - Await pathology results.                           - No aspirin, ibuprofen, naproxen, or other                            non-steroidal anti-inflammatory drugs for 2 weeks                            after polyp removal. Ladene Artist, MD 05/12/2021 11:09:05 AM This report has been signed electronically.

## 2021-05-15 ENCOUNTER — Encounter: Payer: Self-pay | Admitting: Family Medicine

## 2021-05-15 ENCOUNTER — Emergency Department
Admission: EM | Admit: 2021-05-15 | Discharge: 2021-05-15 | Disposition: A | Discharge status: Home / Self Care | Payer: Medicare Other | Source: Home / Self Care

## 2021-05-15 ENCOUNTER — Other Ambulatory Visit: Payer: Self-pay

## 2021-05-15 DIAGNOSIS — W19XXXA Unspecified fall, initial encounter: Secondary | ICD-10-CM | POA: Diagnosis not present

## 2021-05-15 DIAGNOSIS — M546 Pain in thoracic spine: Secondary | ICD-10-CM

## 2021-05-15 DIAGNOSIS — I959 Hypotension, unspecified: Secondary | ICD-10-CM | POA: Diagnosis not present

## 2021-05-15 DIAGNOSIS — R42 Dizziness and giddiness: Secondary | ICD-10-CM

## 2021-05-15 DIAGNOSIS — R0781 Pleurodynia: Secondary | ICD-10-CM

## 2021-05-15 DIAGNOSIS — G20 Parkinson's disease: Secondary | ICD-10-CM | POA: Diagnosis not present

## 2021-05-15 DIAGNOSIS — G20A1 Parkinson's disease without dyskinesia, without mention of fluctuations: Secondary | ICD-10-CM

## 2021-05-15 NOTE — ED Triage Notes (Addendum)
Pt presents to Urgent Care with c/o R mid-lower back pain following fall today. Reports falling in the kitchen and rolling--does not recall where she landed originally. Pt also reports dizziness since yesterday, and son reports that pt's BP fluctuated yesterday from "high to low." Son also reports that she passed out yesterday, which she occasionally does when her BP gets high. Pt states she normally runs a low BP. Had a colonoscopy 3 days ago.

## 2021-05-15 NOTE — ED Notes (Signed)
Patient is being discharged from the Urgent Care and sent to the Emergency Department via personal vehicle . Per Kirkland Hun, FNP, patient is in need of higher level of care due to hypotension. Patient is aware and verbalizes understanding of plan of care.  Vitals:   05/15/21 1543 05/15/21 1553  BP:  (!) 84/68  Pulse:    Resp:    Temp:    SpO2: 97%

## 2021-05-16 ENCOUNTER — Other Ambulatory Visit: Payer: Self-pay | Admitting: Neurology

## 2021-05-16 ENCOUNTER — Encounter (HOSPITAL_COMMUNITY): Payer: Self-pay | Admitting: *Deleted

## 2021-05-16 ENCOUNTER — Emergency Department (HOSPITAL_COMMUNITY): Payer: Medicare Other

## 2021-05-16 ENCOUNTER — Observation Stay (HOSPITAL_COMMUNITY)
Admission: EM | Admit: 2021-05-16 | Discharge: 2021-05-17 | Disposition: A | Discharge status: Home / Self Care | Payer: Medicare Other | Attending: Internal Medicine | Admitting: Internal Medicine

## 2021-05-16 DIAGNOSIS — I700 Atherosclerosis of aorta: Secondary | ICD-10-CM | POA: Diagnosis not present

## 2021-05-16 DIAGNOSIS — J9811 Atelectasis: Secondary | ICD-10-CM | POA: Diagnosis not present

## 2021-05-16 DIAGNOSIS — I951 Orthostatic hypotension: Secondary | ICD-10-CM

## 2021-05-16 DIAGNOSIS — G20 Parkinson's disease: Secondary | ICD-10-CM | POA: Insufficient documentation

## 2021-05-16 DIAGNOSIS — W010XXA Fall on same level from slipping, tripping and stumbling without subsequent striking against object, initial encounter: Secondary | ICD-10-CM | POA: Insufficient documentation

## 2021-05-16 DIAGNOSIS — Z79899 Other long term (current) drug therapy: Secondary | ICD-10-CM | POA: Diagnosis not present

## 2021-05-16 DIAGNOSIS — R55 Syncope and collapse: Secondary | ICD-10-CM | POA: Diagnosis present

## 2021-05-16 DIAGNOSIS — Y92000 Kitchen of unspecified non-institutional (private) residence as  the place of occurrence of the external cause: Secondary | ICD-10-CM | POA: Insufficient documentation

## 2021-05-16 DIAGNOSIS — I2510 Atherosclerotic heart disease of native coronary artery without angina pectoris: Secondary | ICD-10-CM | POA: Insufficient documentation

## 2021-05-16 DIAGNOSIS — Y9301 Activity, walking, marching and hiking: Secondary | ICD-10-CM | POA: Diagnosis not present

## 2021-05-16 DIAGNOSIS — N390 Urinary tract infection, site not specified: Secondary | ICD-10-CM

## 2021-05-16 DIAGNOSIS — S299XXA Unspecified injury of thorax, initial encounter: Secondary | ICD-10-CM | POA: Diagnosis present

## 2021-05-16 DIAGNOSIS — M546 Pain in thoracic spine: Secondary | ICD-10-CM | POA: Diagnosis not present

## 2021-05-16 DIAGNOSIS — W19XXXA Unspecified fall, initial encounter: Secondary | ICD-10-CM

## 2021-05-16 DIAGNOSIS — Z20822 Contact with and (suspected) exposure to covid-19: Secondary | ICD-10-CM | POA: Insufficient documentation

## 2021-05-16 DIAGNOSIS — M25511 Pain in right shoulder: Secondary | ICD-10-CM | POA: Diagnosis not present

## 2021-05-16 DIAGNOSIS — S3991XA Unspecified injury of abdomen, initial encounter: Secondary | ICD-10-CM | POA: Diagnosis not present

## 2021-05-16 DIAGNOSIS — I509 Heart failure, unspecified: Secondary | ICD-10-CM | POA: Insufficient documentation

## 2021-05-16 DIAGNOSIS — S2241XA Multiple fractures of ribs, right side, initial encounter for closed fracture: Secondary | ICD-10-CM | POA: Diagnosis not present

## 2021-05-16 DIAGNOSIS — M542 Cervicalgia: Secondary | ICD-10-CM | POA: Diagnosis not present

## 2021-05-16 DIAGNOSIS — Y92009 Unspecified place in unspecified non-institutional (private) residence as the place of occurrence of the external cause: Secondary | ICD-10-CM | POA: Insufficient documentation

## 2021-05-16 DIAGNOSIS — M5136 Other intervertebral disc degeneration, lumbar region: Secondary | ICD-10-CM | POA: Diagnosis not present

## 2021-05-16 DIAGNOSIS — J479 Bronchiectasis, uncomplicated: Secondary | ICD-10-CM | POA: Diagnosis not present

## 2021-05-16 DIAGNOSIS — M545 Low back pain, unspecified: Secondary | ICD-10-CM | POA: Diagnosis not present

## 2021-05-16 DIAGNOSIS — R519 Headache, unspecified: Secondary | ICD-10-CM | POA: Diagnosis not present

## 2021-05-16 DIAGNOSIS — Z043 Encounter for examination and observation following other accident: Secondary | ICD-10-CM | POA: Diagnosis not present

## 2021-05-16 LAB — CBC WITH DIFFERENTIAL/PLATELET
Abs Immature Granulocytes: 0.04 10*3/uL (ref 0.00–0.07)
Basophils Absolute: 0 10*3/uL (ref 0.0–0.1)
Basophils Relative: 0 %
Eosinophils Absolute: 0.3 10*3/uL (ref 0.0–0.5)
Eosinophils Relative: 4 %
HCT: 40.5 % (ref 36.0–46.0)
Hemoglobin: 11.9 g/dL — ABNORMAL LOW (ref 12.0–15.0)
Immature Granulocytes: 1 %
Lymphocytes Relative: 26 %
Lymphs Abs: 2 10*3/uL (ref 0.7–4.0)
MCH: 28.1 pg (ref 26.0–34.0)
MCHC: 29.4 g/dL — ABNORMAL LOW (ref 30.0–36.0)
MCV: 95.7 fL (ref 80.0–100.0)
Monocytes Absolute: 0.6 10*3/uL (ref 0.1–1.0)
Monocytes Relative: 7 %
Neutro Abs: 4.8 10*3/uL (ref 1.7–7.7)
Neutrophils Relative %: 62 %
Platelets: 264 10*3/uL (ref 150–400)
RBC: 4.23 MIL/uL (ref 3.87–5.11)
RDW: 14.2 % (ref 11.5–15.5)
WBC: 7.8 10*3/uL (ref 4.0–10.5)
nRBC: 0 % (ref 0.0–0.2)

## 2021-05-16 LAB — COMPREHENSIVE METABOLIC PANEL
ALT: <5 U/L (ref 0–44)
AST: 16 U/L (ref 15–41)
Albumin: 3.5 g/dL (ref 3.5–5.0)
Alkaline Phosphatase: 63 U/L (ref 38–126)
Anion gap: 10 (ref 5–15)
BUN: 11 mg/dL (ref 8–23)
CO2: 28 mmol/L (ref 22–32)
Calcium: 9.6 mg/dL (ref 8.9–10.3)
Chloride: 99 mmol/L (ref 98–111)
Creatinine, Ser: 0.64 mg/dL (ref 0.44–1.00)
GFR, Estimated: >60 mL/min (ref >60)
Glucose, Bld: 92 mg/dL (ref 70–99)
Potassium: 4 mmol/L (ref 3.5–5.1)
Sodium: 137 mmol/L (ref 135–145)
Total Bilirubin: 0.6 mg/dL (ref 0.3–1.2)
Total Protein: 6.7 g/dL (ref 6.5–8.1)

## 2021-05-16 LAB — RAPID URINE DRUG SCREEN, HOSP PERFORMED
Amphetamines: NOT DETECTED
Barbiturates: NOT DETECTED
Benzodiazepines: POSITIVE — AB
Cocaine: NOT DETECTED
Opiates: NOT DETECTED
Tetrahydrocannabinol: NOT DETECTED

## 2021-05-16 LAB — URINALYSIS, ROUTINE W REFLEX MICROSCOPIC
Bilirubin Urine: NEGATIVE
Glucose, UA: 50 mg/dL — AB
Ketones, ur: 5 mg/dL — AB
Nitrite: POSITIVE — AB
Protein, ur: NEGATIVE mg/dL
Specific Gravity, Urine: 1.005 (ref 1.005–1.030)
pH: 7 (ref 5.0–8.0)

## 2021-05-16 LAB — CBG MONITORING, ED
Glucose-Capillary: 59 mg/dL — ABNORMAL LOW (ref 70–99)
Glucose-Capillary: 58 mg/dL — ABNORMAL LOW (ref 70–99)
Glucose-Capillary: 102 mg/dL — ABNORMAL HIGH (ref 70–99)

## 2021-05-16 LAB — SAMPLE TO BLOOD BANK

## 2021-05-16 LAB — RESP PANEL BY RT-PCR (FLU A&B, COVID) ARPGX2
Influenza A by PCR: NEGATIVE
Influenza B by PCR: NEGATIVE
SARS Coronavirus 2 by RT PCR: NEGATIVE

## 2021-05-16 LAB — MAGNESIUM: Magnesium: 1.7 mg/dL (ref 1.7–2.4)

## 2021-05-16 LAB — TROPONIN I (HIGH SENSITIVITY): Troponin I (High Sensitivity): 4 ng/L (ref <18)

## 2021-05-16 MED ORDER — PRAMIPEXOLE DIHYDROCHLORIDE 0.25 MG PO TABS
0.5000 mg | ORAL_TABLET | ORAL | Status: DC
  Administered 2021-05-17 (×2): 0.5 mg via ORAL
  Filled 2021-05-16 (×4): qty 2

## 2021-05-16 MED ORDER — SERTRALINE HCL 100 MG PO TABS
100.0000 mg | ORAL_TABLET | Freq: Every morning | ORAL | Status: DC
  Administered 2021-05-17: 100 mg via ORAL
  Filled 2021-05-16: qty 1

## 2021-05-16 MED ORDER — CARBIDOPA-LEVODOPA 25-100 MG PO TABS: 0.5000 | ORAL_TABLET | ORAL | Status: DC

## 2021-05-16 MED ORDER — ONDANSETRON HCL 4 MG/2ML IJ SOLN
4.0000 mg | Freq: Once | INTRAMUSCULAR | Status: AC
  Administered 2021-05-16: 4 mg via INTRAVENOUS
  Filled 2021-05-16: qty 2

## 2021-05-16 MED ORDER — CARBIDOPA-LEVODOPA ER 50-200 MG PO TBCR
1.0000 | EXTENDED_RELEASE_TABLET | Freq: Every day | ORAL | Status: DC
  Filled 2021-05-16 (×2): qty 1

## 2021-05-16 MED ORDER — SENNOSIDES-DOCUSATE SODIUM 8.6-50 MG PO TABS: 2.0000 | ORAL_TABLET | Freq: Every day | ORAL | Status: DC

## 2021-05-16 MED ORDER — POLYETHYLENE GLYCOL 3350 17 G PO PACK: 17.0000 g | PACK | Freq: Every day | ORAL | Status: DC | PRN

## 2021-05-16 MED ORDER — VITAMIN D 25 MCG (1000 UNIT) PO TABS
1000.0000 [IU] | ORAL_TABLET | Freq: Every day | ORAL | Status: DC
  Administered 2021-05-17: 1000 [IU] via ORAL
  Filled 2021-05-16 (×2): qty 1

## 2021-05-16 MED ORDER — SODIUM CHLORIDE 0.9 % IV BOLUS
1000.0000 mL | Freq: Once | INTRAVENOUS | Status: AC
  Administered 2021-05-16: 1000 mL via INTRAVENOUS

## 2021-05-16 MED ORDER — TRAMADOL HCL 50 MG PO TABS
50.0000 mg | ORAL_TABLET | Freq: Three times a day (TID) | ORAL | Status: DC | PRN
  Administered 2021-05-16: 50 mg via ORAL
  Filled 2021-05-16: qty 1

## 2021-05-16 MED ORDER — HYDROMORPHONE HCL 1 MG/ML IJ SOLN
0.5000 mg | INTRAMUSCULAR | Status: DC | PRN
  Administered 2021-05-17: 0.5 mg via INTRAVENOUS
  Filled 2021-05-16: qty 1

## 2021-05-16 MED ORDER — FENTANYL CITRATE (PF) 100 MCG/2ML IJ SOLN
50.0000 ug | Freq: Once | INTRAMUSCULAR | Status: AC
  Administered 2021-05-16: 50 ug via INTRAVENOUS
  Filled 2021-05-16: qty 2

## 2021-05-16 MED ORDER — CARBIDOPA-LEVODOPA ER 50-200 MG PO TBCR
1.0000 | EXTENDED_RELEASE_TABLET | Freq: Once | ORAL | Status: AC
  Administered 2021-05-16: 1 via ORAL
  Filled 2021-05-16: qty 1

## 2021-05-16 MED ORDER — SODIUM CHLORIDE 0.9 % IV SOLN
1.0000 g | Freq: Once | INTRAVENOUS | Status: AC
  Administered 2021-05-16: 1 g via INTRAVENOUS
  Filled 2021-05-16: qty 10

## 2021-05-16 MED ORDER — CARBIDOPA-LEVODOPA 25-100 MG PO TABS
1.0000 | ORAL_TABLET | ORAL | Status: DC
  Administered 2021-05-17 (×2): 1 via ORAL
  Filled 2021-05-16 (×4): qty 1

## 2021-05-16 MED ORDER — DEXTROSE 50 % IV SOLN
1.0000 | Freq: Once | INTRAVENOUS | Status: AC
  Administered 2021-05-16: 50 mL via INTRAVENOUS
  Filled 2021-05-16: qty 50

## 2021-05-16 MED ORDER — OXYCODONE HCL 5 MG PO TABS
5.0000 mg | ORAL_TABLET | ORAL | Status: DC | PRN
  Administered 2021-05-17: 5 mg via ORAL
  Filled 2021-05-16: qty 1

## 2021-05-16 MED ORDER — CARBIDOPA-LEVODOPA 25-100 MG PO TABS
2.0000 | ORAL_TABLET | Freq: Every morning | ORAL | Status: DC
  Administered 2021-05-17: 2 via ORAL
  Filled 2021-05-16: qty 2

## 2021-05-16 MED ORDER — MIDAZOLAM HCL 2 MG/2ML IJ SOLN
2.0000 mg | Freq: Once | INTRAMUSCULAR | Status: AC
  Administered 2021-05-16: 2 mg via INTRAVENOUS
  Filled 2021-05-16: qty 2

## 2021-05-16 MED ORDER — MIDODRINE HCL 5 MG PO TABS: 10.0000 mg | ORAL_TABLET | Freq: Three times a day (TID) | ORAL | Status: DC | PRN

## 2021-05-16 MED ORDER — CLONAZEPAM 0.5 MG PO TABS
0.5000 mg | ORAL_TABLET | Freq: Two times a day (BID) | ORAL | Status: DC
  Administered 2021-05-16 – 2021-05-17 (×2): 0.5 mg via ORAL
  Filled 2021-05-16 (×2): qty 1

## 2021-05-16 MED ORDER — SODIUM CHLORIDE 0.9 % IV SOLN: 1.0000 g | INTRAVENOUS | Status: DC

## 2021-05-16 MED ORDER — SODIUM CHLORIDE 0.9% FLUSH
3.0000 mL | Freq: Two times a day (BID) | INTRAVENOUS | Status: DC
  Administered 2021-05-16 – 2021-05-17 (×2): 3 mL via INTRAVENOUS

## 2021-05-16 MED ORDER — ONDANSETRON HCL 4 MG/2ML IJ SOLN: 4.0000 mg | Freq: Four times a day (QID) | INTRAMUSCULAR | Status: DC | PRN

## 2021-05-16 MED ORDER — ACETAMINOPHEN 325 MG PO TABS
650.0000 mg | ORAL_TABLET | Freq: Four times a day (QID) | ORAL | Status: DC | PRN
  Administered 2021-05-16 – 2021-05-17 (×2): 650 mg via ORAL
  Filled 2021-05-16 (×2): qty 2

## 2021-05-16 MED ORDER — LATANOPROST 0.005 % OP SOLN
1.0000 [drp] | Freq: Every day | OPHTHALMIC | Status: DC
  Filled 2021-05-16: qty 2.5

## 2021-05-16 MED ORDER — ENOXAPARIN SODIUM 40 MG/0.4ML IJ SOSY: 40.0000 mg | PREFILLED_SYRINGE | INTRAMUSCULAR | Status: DC

## 2021-05-16 MED ORDER — SODIUM CHLORIDE 0.9 % IV SOLN: INTRAVENOUS | Status: DC

## 2021-05-16 MED ORDER — DEXTROSE 50 % IV SOLN
12.5000 g | Freq: Once | INTRAVENOUS | Status: AC
  Administered 2021-05-16: 12.5 g via INTRAVENOUS
  Filled 2021-05-16: qty 50

## 2021-05-16 NOTE — ED Provider Notes (Signed)
Emergency Medicine Provider Triage Evaluation Note  [name] , a 75 y.o. female  was evaluated in triage.  Pt complains of pain in the right sided chest.  History is primarily provided by patient's son per patient request.  Patient has Parkinson's at baseline, movement levels are not changed or different from usual. Unclear if she hit her head.  She does have neck pain however unclear if that is from her spasms or the fall. She primarily reports pain in her right shoulder and in the right sided ribs.  No abdominal pain.  Additionally according to her son she has had multiple syncopal events while in the waiting room lasting about a minute each.  Review of Systems  Positive: Fall, right-sided rib pain, shortness of breath Negative: Fevers  Physical Exam  BP (!) 92/40 (BP Location: Left Arm)   Pulse 75   Temp 97.9 F (36.6 C)   Resp (!) 22   SpO2 97%  Gen:   Awake, constant movements consistent with Parkinson's Resp:  Normal effort lung sounds clear to auscultation bilaterally. MSK:   Moves extremities without difficulty  Other:  Tenderness over right-sided ribs.  No abdominal TTP.    Medical Decision Making  Medically screening exam initiated at 1:09 PM.  Appropriate orders placed.  HIEN CUNLIFFE was informed that the remainder of the evaluation will be completed by another provider, this initial triage assessment does not replace that evaluation, and the importance of remaining in the ED until their evaluation is complete.  Son reports low BPS at urgent care.  Says she is normally in the 08M systolic and 57/84 is a normal blood pressure for her.  I suspect that this is an accurate blood pressure as if she was able to hold relatively still during this.   Note: Portions of this report may have been transcribed using voice recognition software. Every effort was made to ensure accuracy; however, inadvertent computerized transcription errors may be present    Ollen Gross 05/16/21 1312    Charlesetta Shanks, MD 05/25/21 [phone]

## 2021-05-16 NOTE — Progress Notes (Signed)
Called to [facility] department.  Per staff patient complaining that she can't breath when they placed her flat on the Xray table.  They also describe a couple of episodes of possible syncope.  Patient transported back to ED.  Patient with parkinsonian movements.  Witnessed 2 episodes where she became very still and did not respond to staff.  Manual bp 100/60 She is frequently complaining that she cant breath and asking for her son.  Hand off with ED RN Dian Situ.  Son to bedside.

## 2021-05-16 NOTE — Telephone Encounter (Signed)
PLEASE NOTE: All timestamps contained within this report are represented as [place] Standard Time. CONFIDENTIALTY NOTICE: This fax transmission is intended only for the addressee. It contains information that is legally privileged, confidential or otherwise protected from use or disclosure. If you are not the intended recipient, you are strictly prohibited from reviewing, disclosing, copying using or disseminating any of this information or taking any action in reliance on or regarding this information. If you have received this fax in error, please notify us immediately by telephone so that we can arrange for its return to [place]. Phone: [phone], Toll-Free: [phone], Fax: [phone] Page: 1 of 1 Call Id: [id number] [place] Night - Client Nonclinical Telephone Record  [facility] Client [place] Night - Client Client Site [place] Physician [name] - MD Contact Type Call Who Is Calling Patient / Member / Family / Caregiver Caller Name [place] Phone Number [phone] Patient Name [name] Patient DOB 1946-09-09 Call Type Message Only Information Provided Reason for Call Request to Schedule Office Appointment Initial Comment Caller states she fell yesterday. She went to [facility] and they wanted her to follow up with the doctor. She hurt her right side. Patient request to speak to RN No Additional Comment Transferred caller to the office to schedule an appt. Office hours were provided. Disp. Time Disposition Final User 05/16/2021 8:08:50 AM General Information Provided Yes Gaspar Skeeters Call Closed By: Gaspar Skeeters Transaction Date/Time: 05/16/2021 8:02:17 AM (ET)

## 2021-05-16 NOTE — Telephone Encounter (Signed)
Patient is currently in the ED.

## 2021-05-16 NOTE — ED Notes (Addendum)
Brought to room 29 after a seizure/ syncopal episodes in CT.

## 2021-05-16 NOTE — ED Provider Notes (Signed)
Care assumed from [place] at shift change, please see his note for full details, but in brief [name] is a 75 y.o. female with hx of parkinson's presents after a fall yesterday evening while walking with her walker, question possible syncopal event.  They went to urgent care yesterday and were told to go to the ED because patient was found to be hypotensive but instead went home, returns today due to worsening pain.  Patient with tenderness along the right chest wall and right abdomen and flank as well as some neck tenderness.  On x-rays patient was found to have 2 right-sided rib fractures.  CTs of the head, C-spine chest, abdomen and pelvis ordered, scans ordered without contrast due to allergy.  Attempted to do scans earlier but due to patient's significant parkinsonian movements could not take her to CT, family reported this is typically worse when she is in pain so initially tried pain control, but patient still having persistent movements.  Patient has also had reportedly another syncopal episode in the lobby and may have had a second syncopal episode while in CT where she briefly went unresponsive per CT tech, this was not witnessed by provider.  Plan: Follow-up imaging to assess full extent of injuries from fall, patient will require admission in particular given likely multiple syncopal episodes unprovoked and while supine.  BP (!) 146/124   Pulse 72   Temp 97.9 F (36.6 C)   Resp (!) 23   SpO2 97%    ED Course/Procedures   Labs Reviewed  CBC WITH DIFFERENTIAL/PLATELET - Abnormal; Notable for the following components:      Result Value   Hemoglobin 11.9 (*)    MCHC 29.4 (*)    All other components within normal limits  CBG MONITORING, ED - Abnormal; Notable for the following components:   Glucose-Capillary 59 (*)    All other components within normal limits  RESP PANEL BY RT-PCR (FLU A&B, COVID) [id number]  COMPREHENSIVE METABOLIC PANEL  URINALYSIS, ROUTINE W REFLEX  MICROSCOPIC  RAPID URINE DRUG SCREEN, HOSP PERFORMED  MAGNESIUM  SAMPLE TO BLOOD BANK  TROPONIN I (HIGH SENSITIVITY)   EKG Interpretation  Date/Time:  Monday May 16 2021 14:17:29 EDT Ventricular Rate:  78 PR Interval:  211 QRS Duration: 101 QT Interval:  418 QTC Calculation: 477 R Axis:   -10 Text Interpretation: Sinus rhythm Interpretation limited secondary to artifact Confirmed by Calvert Cantor [id number]) on 05/16/2021 2:53:53 PM  DG Ribs Unilateral W/Chest Right  Result Date: 05/16/2021 CLINICAL DATA:  [place].  Right-sided chest pain. EXAM: RIGHT RIBS AND CHEST - 3+ VIEW COMPARISON:  None. FINDINGS: The cardiac silhouette, mediastinal and hilar contours are within normal limits. The lungs are clear of an acute process. No infiltrates, effusions or pneumothorax. The bony thorax is intact. There is a minimally displaced ninth rib fracture. An eighth rib fracture is also suspected. IMPRESSION: 1. No acute cardiopulmonary findings. 2. Right eighth and ninth rib fractures. Electronically Signed   By: Marijo Sanes M.D.   On: 05/16/2021 14:24   DG Thoracic Spine 2 View  Result Date: 05/16/2021 CLINICAL DATA:  [place].  Back pain. EXAM: THORACIC SPINE 2 VIEWS COMPARISON:  None. FINDINGS: Normal alignment of the thoracic vertebral bodies. Disc spaces and vertebral bodies are fairly well maintained. Mild degenerative changes. No acute compression fracture. No abnormal paraspinal soft tissue thickening. The visualized posterior ribs are intact. IMPRESSION: Mild degenerative changes but no acute bony findings. Electronically Signed   By: Mamie Nick.  Gallerani M.D.   On: 05/16/2021 14:39   DG Lumbar Spine Complete  Result Date: 05/16/2021 CLINICAL DATA:  [place].  Back pain. EXAM: LUMBAR SPINE - COMPLETE 4+ VIEW COMPARISON:  None. FINDINGS: The lumbar vertebral bodies are normally aligned. No acute fracture. The facets are normally aligned. No pars defects. Moderate degenerative disc disease noted mainly at L3-4  and L4-5. The visualized bony pelvis is intact. IMPRESSION: 1. Normal alignment and no acute bony findings. 2. Moderate degenerative disc disease at L3-4 and L4-5. Electronically Signed   By: Marijo Sanes M.D.   On: 05/16/2021 14:37   DG Shoulder Right  Result Date: 05/16/2021 CLINICAL DATA:  [place].  Right shoulder pain. EXAM: RIGHT SHOULDER - 2+ VIEW COMPARISON:  None. FINDINGS: The glenohumeral and AC joints are intact. No acute shoulder fracture. The scapula appears intact. The right eighth and ninth rib fractures are again noted. No pneumothorax. IMPRESSION: 1. No fracture or dislocation of the right shoulder. 2. Right eighth and ninth rib fractures. Electronically Signed   By: Marijo Sanes M.D.   On: 05/16/2021 14:36     MDM   I have independently ordered, reviewed and interpreted all labs and imaging:  Aside from known eighth and ninth fractures of the right lateral ribs no other traumatic injuries noted on CT imaging.  CT of the head is clear without acute abnormalities.  Lab work reassuring aside from signs of urinary tract infection, culture sent and patient started on IV Rocephin.  Given fall with rib fractures in the setting of likely syncopal episode with additional possible episodes of syncope here today feel patient will require admission.  Case discussed with Dr. Irene Pap with [facility] hospital[facility] who will see and admit the patient.       Janet Berlin 05/16/21 2054    Luna Fuse, MD 05/23/21 [phone]

## 2021-05-16 NOTE — ED Notes (Signed)
Patients involuntary twitching has calmed down. Patient is now ready for CT

## 2021-05-16 NOTE — H&P (Signed)
History and Physical  [name] PPJ:[id number] DOB: Aug 26, 1946 DOA: 05/16/2021  Referring physician: Benedetto Goad, PA-EDP  PCP: Tara Bush, MD  Outpatient Specialists: Neurology, neurosurgery. Patient coming from: Home.  \  Chief Complaint: Fall  HPI: [name] is a 75 y.o. female with medical history significant for Parkinson disease with scheduled deep brain stimulator placement in August 11,2022, orthostatic hypotension due to Parkinson's disease, chronic anxiety/depression, mild neurocognitive disorder due to Parkinson's disease, herpes simplex keratitis of right eye, who presented to [facility] ED from home after a fall yesterday evening.  Last fall prior to this fall was at least a year ago.  Associated with dizziness, lightheadedness prior to falling.  Patient uses a walker at baseline.  She was standing at the refrigerator with the aid of her walker when she suddenly felt lightheaded and fell to the floor.  After her fall she had complained of right-sided rib pain, right sided back pain, and neck pain.  She was evaluated in urgent care following the fall and was advised to come to the ED for further evaluation.  She went home instead.  The following morning she contacted her PCP due to worsening pain.  She was again advised to come to the ED for further evaluation.  Patient was brought into the ED for further evaluation.  While in the ED her son reported recurrent syncopal episodes lasting about a minute each.  Patient reported lightheadedness and dizziness prior to these events.  CT head, CT cervical spine, CT chest without contrast, CT abdomen pelvis without contrast revealed acute right posterior eighth and ninth rib fractures no pneumothorax.  Bilateral lower lobe bronchial wall thickening and cylindrical bronchiectasis likely reflecting sequela of chronic inflammation, recurrent aspiration not excluded.  Aortic atherosclerosis.  The CT head and cervical spine were without acute  abnormalities.  UA was positive for pyuria.  She received a dose of Rocephin in the ED.  EDP requested admission for syncopal work-up.  ED Course:  Temperature 97.9.  BP 108/65, pulse 74, respiration rate 27, O2 saturation 97% on room air.  CMP unremarkable.  CBC with differential unremarkable except for hemoglobin of 11.9 which is uptrending from 11.2 previously.  UA positive for pyuria.  Right eighth and ninth rib fractures on imagings, no infiltrates, effusions or pneumothorax.  Review of Systems: Review of systems as noted in the HPI. All other systems reviewed and are negative.   Past Medical History:  Diagnosis Date   Allergy    ANEMIA-NOS 09/25/2007   Anxiety    Arthritis    B12 DEFICIENCY 05/03/2007   Blood transfusion without reported diagnosis    CAD (coronary artery disease) 01/2010   MI, Nishan   Cardiomyopathy ([place]) 02/08/2010   H/o this 2012 after urosepsis, no recurrence.    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   OSTEOPOROSIS 08/2009   bisphosphonate on hold 2/2 dysphagia, on reclast done in August each year   Parkinson's disease ([place]) 08/25/2015  Dx Dr [name] 07/2015    PONV (postoperative nausea and vomiting)    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   Takotsubo cardiomyopathy 2008   due to E coli urosepsis   Past Surgical History:  Procedure Laterality Date   ABDOMINAL HYSTERECTOMY  1970s   IUD infection - first partial then with oophorectomy (cysts), complication - low blood  pressure   CATARACT EXTRACTION Bilateral    CHOLECYSTECTOMY     complication - low blood pressure   COLONOSCOPY  06/2008   h/o polyps but latest WNL, rec rpt 10 yrs Olevia Perches)   COLONOSCOPY  11/2018   multiple TAs (10 polyps total), rpt 2 yrs Fuller Plan)   CYSTOSCOPY  12/2013   abx treatment for recurrent cystitis   DEXA  04/2013   T -2.9 @ femur, -1.6 @ spine   DEXA  04/2017   T -2.9 hip, -0.7 spine   ESOPHAGOGASTRODUODENOSCOPY  12/2017   WNL, regardless esophagus dilated, small HH Fuller Plan)   LUMBAR LAMINECTOMY/DECOMPRESSION MICRODISCECTOMY Right 11/27/2019   Right Lumbar Four-Five foraminotomy;  Erline Levine, MD)   PTOSIS REPAIR Bilateral 10/2020   Plastic Surgery   UPPER GASTROINTESTINAL ENDOSCOPY      Social History:  reports that she has never smoked. She has never used smokeless tobacco. She reports previous drug use. Drug: Nitrous oxide. She reports that she does not drink alcohol.   Allergies  Allergen Reactions   Amitriptyline Other (See Comments)    Sedated next morning   Ciprofloxacin Nausea And Vomiting   Cymbalta [Duloxetine Hcl] Other (See Comments)    Worsened depression   Iohexol Other (See Comments)     Code: HIVES, Desc: PT developed 2 hives, followed by SOB, severe headache post 87cc's Omnipaque 300., Onset Date: [id number]    Lyrica [Pregabalin] Other (See Comments)    Tried during hospitalization - unsure effects but unable to tolerate   Imipramine Hcl Rash   Iodine Rash   Lidocaine Hcl Rash   Morphine Sulfate Rash   Neosporin [Neomycin-Bacitracin Zn-Polymyx] Rash and Other (See Comments)    Worsened skin breaking out   Sulfamethoxazole Rash   Tetracyclines & Related Rash    Family History  Problem Relation Age of Onset   Heart attack Father    Diabetes Father    Prostate cancer Father    Esophageal cancer Mother    Lung cancer Brother    Breast cancer Sister    Ovarian cancer Sister    Lung cancer Brother    CAD Brother    Uterine cancer Sister     Clotting disorder Son    Healthy Son    Healthy Daughter    Healthy Daughter    Colon cancer Neg Hx    Rectal cancer Neg Hx    Stomach cancer Neg Hx       Prior to Admission medications   Medication Sig Start Date End Date Taking? Authorizing Provider  acetaminophen (TYLENOL) 650 MG CR tablet Take 1,300 mg by mouth every 8 (eight) hours.   Yes [provider]  carbidopa-levodopa (SINEMET CR) 50-200 MG tablet TAKE 1 TABLET BY MOUTH AT  BEDTIME 03/01/21  Yes [name], Eustace Quail, DO  carbidopa-levodopa (SINEMET IR) 25-100 MG tablet 2 tablet at 6am/1 tablet at 9:30am/1pm/4:30pm.  May take an extra 1/2 tablet prn evening Patient taking differently: Take 0.5-2 tablets by mouth See admin instructions. Take 2 tablets by mouth at 6am, 1 tablet at 9:00am, 1pm and 4pm.  May  take an extra 1/2 tablet as needed for tremors between 4pm and 8pm. 12/22/20  Yes [name], Eustace Quail, DO  clonazePAM (KLONOPIN) 0.5 MG tablet TAKE 1 TABLET BY MOUTH  TWICE DAILY Patient taking differently: Take 0.5 mg by mouth 2 (two) times daily. 05/11/21  Yes Tara Bush, MD  cyanocobalamin (,VITAMIN B-12,) 1000 MCG/ML injection INJECT 1 ML (1,000 MCG TOTAL) INTO THE MUSCLE EVERY 30 (THIRTY) DAYS. 10/07/19  Yes Tara Bush, MD  denosumab (PROLIA) 60 MG/ML SOSY injection Inject 60 mg into the skin every 6 (six) months.   Yes [provider]  latanoprost (XALATAN) 0.005 % ophthalmic solution Place 1 drop into both eyes at bedtime. 03/22/21  Yes [provider]  polyethylene glycol (MIRALAX / GLYCOLAX) packet Take 17 g by mouth as needed. Patient taking differently: Take 17 g by mouth daily as needed (constipation). 04/19/18  Yes Tara Bush, MD  pramipexole (MIRAPEX) 0.5 MG tablet Take 1 tablet (0.5 mg total) by mouth 3 (three) times daily. Patient taking differently: Take 0.5 mg by mouth See admin instructions. Take one tablet (0.5 mg) by mouth three times daily - 8am, 12pm, 4pm 12/22/20  Yes [name],  Wells Guiles S, DO  sertraline (ZOLOFT) 100 MG tablet TAKE 1 TABLET BY MOUTH  DAILY Patient taking differently: Take 100 mg by mouth every morning. 05/04/21  Yes Tara Bush, MD  traMADol (ULTRAM) 50 MG tablet Take 1 tablet (50 mg total) by mouth 3 (three) times daily as needed. Patient taking differently: Take 50 mg by mouth 3 (three) times daily as needed (pain). 05/11/21  Yes Tara Bush, MD  AMBULATORY NON FORMULARY MEDICATION Lift chair Dx:  G20 09/29/20   [name], Eustace Quail, DO  benzonatate (TESSALON) 200 MG capsule Take 1 capsule (200 mg total) by mouth 3 (three) times daily as needed for cough. Patient not taking: No sig reported 12/20/20   Pleas Koch, NP  Calcium Carbonate-Vitamin D (CALCIUM 600/VITAMIN D) 600-400 MG-UNIT chew tablet Chew 1 tablet by mouth daily. 12/10/17   Tara Bush, MD  Cholecalciferol (VITAMIN D3) 25 MCG (1000 UT) CAPS Take 1 capsule (1,000 Units total) by mouth daily. Patient taking differently: Take 1,000 Units by mouth daily. 10/10/18   Tara Bush, MD  gabapentin (NEURONTIN) 100 MG capsule Take 2 capsules (200 mg total) by mouth at bedtime. Patient not taking: No sig reported 03/11/20   Tara Bush, MD  triamcinolone cream (KENALOG) 0.1 % APPLY TO AFFECTED AREA TWICE A DAY Patient not taking: No sig reported 07/16/19   Tara Bush, MD    Physical Exam: BP 108/65   Pulse 74   Temp 97.9 F (36.6 C)   Resp (!) 27   SpO2 99%   General: 75 y.o. year-old female well developed well nourished in no acute distress.  Alert and oriented x3. Cardiovascular: Regular rate and rhythm with no rubs or gallops.  No thyromegaly or JVD noted.  No lower extremity edema. 2/4 pulses in all 4 extremities. Respiratory: Clear to auscultation with no wheezes or rales. Good inspiratory effort. Abdomen: Soft nontender nondistended with normal bowel sounds x4 quadrants. Muskuloskeletal: No cyanosis, clubbing or edema noted bilaterally Neuro: CN II-XII  intact, strength, sensation, reflexes Skin: No ulcerative lesions noted or rashes Psychiatry: Judgement and insight appear normal. Mood is appropriate for condition and setting          Labs on Admission:  Basic Metabolic Panel: Recent Labs  Lab 05/16/21 1315 05/16/21 1518  NA 137  --   K  4.0  --   CL 99  --   CO2 28  --   GLUCOSE 92  --   BUN 11  --   CREATININE 0.64  --   CALCIUM 9.6  --   MG  --  1.7   Liver Function Tests: Recent Labs  Lab 05/16/21 1315  AST 16  ALT <5  ALKPHOS 63  BILITOT 0.6  PROT 6.7  ALBUMIN 3.5   No results for input(s): LIPASE, AMYLASE in the last 168 hours. No results for input(s): AMMONIA in the last 168 hours. CBC: Recent Labs  Lab 05/16/21 1315  WBC 7.8  NEUTROABS 4.8  HGB 11.9*  HCT 40.5  MCV 95.7  PLT 264   Cardiac Enzymes: No results for input(s): CKTOTAL, CKMB, CKMBINDEX, TROPONINI in the last 168 hours.  BNP (last 3 results) No results for input(s): BNP in the last 8760 hours.  ProBNP (last 3 results) No results for input(s): PROBNP in the last 8760 hours.  CBG: Recent Labs  Lab 05/16/21 1409 05/16/21 1719 05/16/21 1847  GLUCAP 59* 58* 102*    Radiological Exams on Admission: CT ABDOMEN PELVIS WO CONTRAST  Result Date: 05/16/2021 CLINICAL DATA:  Status post fall.  Right rib fractures. EXAM: CT CHEST, ABDOMEN AND PELVIS WITHOUT CONTRAST TECHNIQUE: Multidetector CT imaging of the chest, abdomen and pelvis was performed following the standard protocol without IV contrast. COMPARISON:  CT AP 04/12/2010 and CT angio chest from 09/22/2005 FINDINGS: CT CHEST FINDINGS Cardiovascular: Heart size normal. No pericardial effusion. Mild aortic atherosclerosis. Mediastinum/Nodes: No enlarged mediastinal, hilar, or axillary lymph nodes. Thyroid gland, trachea, and esophagus demonstrate no significant findings. Lungs/Pleura: Subpleural atelectasis and consolidation overlies both lower lobes. No pleural fluid identified. No  bilateral lower lobe bronchial wall thickening and cylindrical bronchiectasis is identified likely reflecting sequelae of chronic inflammation. Recurrent aspiration not excluded. No pneumothorax identified. Biapical pleuroparenchymal scarring noted, right greater than left. Musculoskeletal: Acute right posterior eighth and ninth rib fractures identified. No additional fractures. CT ABDOMEN PELVIS FINDINGS Hepatobiliary: No hepatic injury or perihepatic hematoma. Status post cholecystectomy. Chronic increase caliber of the common bile duct is unchanged. Pancreas: Unremarkable. No pancreatic ductal dilatation or surrounding inflammatory changes. Spleen: Normal in size without focal abnormality. Adrenals/Urinary Tract: No adrenal hemorrhage or renal injury identified. Bladder is unremarkable. Stomach/Bowel: Stomach is within normal limits. Appendix not confidently identified. No evidence of bowel wall thickening, distention, or inflammatory changes. Vascular/Lymphatic: Aortic atherosclerosis. No aneurysm. No abdominopelvic adenopathy. Reproductive: The uterus is either atrophic or surgically absent. No adnexal mass noted. Other: No free fluid or fluid collections. Musculoskeletal: No acute or suspicious osseous findings. Degenerative disc disease noted within the lumbar spine. IMPRESSION: 1. Acute right posterior eighth and ninth rib fractures. No pneumothorax. 2. Bilateral lower lobe bronchial wall thickening and cylindrical bronchiectasis likely reflecting sequelae of chronic inflammation. Recurrent aspiration not excluded. 3. Subsegmental atelectasis and subpleural thickening overlying both lower lobes likely reflecting dependent changes. 4. Aortic atherosclerosis. Aortic Atherosclerosis (ICD10-I70.0). Electronically Signed   By: Kerby Moors M.D.   On: 05/16/2021 20:12   DG Ribs Unilateral W/Chest Right  Result Date: 05/16/2021 CLINICAL DATA:  [place].  Right-sided chest pain. EXAM: RIGHT RIBS AND CHEST - 3+  VIEW COMPARISON:  None. FINDINGS: The cardiac silhouette, mediastinal and hilar contours are within normal limits. The lungs are clear of an acute process. No infiltrates, effusions or pneumothorax. The bony thorax is intact. There is a minimally displaced ninth rib fracture. An eighth rib fracture is also  suspected. IMPRESSION: 1. No acute cardiopulmonary findings. 2. Right eighth and ninth rib fractures. Electronically Signed   By: Marijo Sanes M.D.   On: 05/16/2021 14:24   DG Thoracic Spine 2 View  Result Date: 05/16/2021 CLINICAL DATA:  [place].  Back pain. EXAM: THORACIC SPINE 2 VIEWS COMPARISON:  None. FINDINGS: Normal alignment of the thoracic vertebral bodies. Disc spaces and vertebral bodies are fairly well maintained. Mild degenerative changes. No acute compression fracture. No abnormal paraspinal soft tissue thickening. The visualized posterior ribs are intact. IMPRESSION: Mild degenerative changes but no acute bony findings. Electronically Signed   By: Marijo Sanes M.D.   On: 05/16/2021 14:39   DG Lumbar Spine Complete  Result Date: 05/16/2021 CLINICAL DATA:  [place].  Back pain. EXAM: LUMBAR SPINE - COMPLETE 4+ VIEW COMPARISON:  None. FINDINGS: The lumbar vertebral bodies are normally aligned. No acute fracture. The facets are normally aligned. No pars defects. Moderate degenerative disc disease noted mainly at L3-4 and L4-5. The visualized bony pelvis is intact. IMPRESSION: 1. Normal alignment and no acute bony findings. 2. Moderate degenerative disc disease at L3-4 and L4-5. Electronically Signed   By: Marijo Sanes M.D.   On: 05/16/2021 14:37   DG Shoulder Right  Result Date: 05/16/2021 CLINICAL DATA:  [place].  Right shoulder pain. EXAM: RIGHT SHOULDER - 2+ VIEW COMPARISON:  None. FINDINGS: The glenohumeral and AC joints are intact. No acute shoulder fracture. The scapula appears intact. The right eighth and ninth rib fractures are again noted. No pneumothorax. IMPRESSION: 1. No fracture or  dislocation of the right shoulder. 2. Right eighth and ninth rib fractures. Electronically Signed   By: Marijo Sanes M.D.   On: 05/16/2021 14:36   CT Head Wo Contrast  Result Date: 05/16/2021 CLINICAL DATA:  Recent fall with headaches and neck pain, initial encounter EXAM: CT HEAD WITHOUT CONTRAST CT CERVICAL SPINE WITHOUT CONTRAST TECHNIQUE: Multidetector CT imaging of the head and cervical spine was performed following the standard protocol without intravenous contrast. Multiplanar CT image reconstructions of the cervical spine were also generated. COMPARISON:  03/27/2010 FINDINGS: CT HEAD FINDINGS Brain: No evidence of acute infarction, hemorrhage, hydrocephalus, extra-axial collection or mass lesion/mass effect. Vascular: No hyperdense vessel or unexpected calcification. Skull: Normal. Negative for fracture or focal lesion. Sinuses/Orbits: No acute finding. Other: None. CT CERVICAL SPINE FINDINGS Alignment: Within normal limits. Skull base and vertebrae: 7 cervical segments are well visualized. Vertebral body height is well maintained. Disc space narrowing is noted at C5-6 with osteophytic changes. Multilevel facet hypertrophic changes are seen. No acute fracture or acute facet abnormality is noted. Soft tissues and spinal canal: Surrounding soft tissue structures show vascular calcification. No acute abnormality is noted. Upper chest: Visualized lung apices demonstrate some mild scarring. Other: None IMPRESSION: CT of the head: No acute intracranial abnormality noted. CT of the cervical spine: Multilevel degenerative change without acute abnormality. Electronically Signed   By: Inez Catalina M.D.   On: 05/16/2021 20:05   CT Chest Wo Contrast  Result Date: 05/16/2021 CLINICAL DATA:  Status post fall.  Right rib fractures. EXAM: CT CHEST, ABDOMEN AND PELVIS WITHOUT CONTRAST TECHNIQUE: Multidetector CT imaging of the chest, abdomen and pelvis was performed following the standard protocol without IV  contrast. COMPARISON:  CT AP 04/12/2010 and CT angio chest from 09/22/2005 FINDINGS: CT CHEST FINDINGS Cardiovascular: Heart size normal. No pericardial effusion. Mild aortic atherosclerosis. Mediastinum/Nodes: No enlarged mediastinal, hilar, or axillary lymph nodes. Thyroid gland, trachea, and esophagus demonstrate no significant findings.  Lungs/Pleura: Subpleural atelectasis and consolidation overlies both lower lobes. No pleural fluid identified. No bilateral lower lobe bronchial wall thickening and cylindrical bronchiectasis is identified likely reflecting sequelae of chronic inflammation. Recurrent aspiration not excluded. No pneumothorax identified. Biapical pleuroparenchymal scarring noted, right greater than left. Musculoskeletal: Acute right posterior eighth and ninth rib fractures identified. No additional fractures. CT ABDOMEN PELVIS FINDINGS Hepatobiliary: No hepatic injury or perihepatic hematoma. Status post cholecystectomy. Chronic increase caliber of the common bile duct is unchanged. Pancreas: Unremarkable. No pancreatic ductal dilatation or surrounding inflammatory changes. Spleen: Normal in size without focal abnormality. Adrenals/Urinary Tract: No adrenal hemorrhage or renal injury identified. Bladder is unremarkable. Stomach/Bowel: Stomach is within normal limits. Appendix not confidently identified. No evidence of bowel wall thickening, distention, or inflammatory changes. Vascular/Lymphatic: Aortic atherosclerosis. No aneurysm. No abdominopelvic adenopathy. Reproductive: The uterus is either atrophic or surgically absent. No adnexal mass noted. Other: No free fluid or fluid collections. Musculoskeletal: No acute or suspicious osseous findings. Degenerative disc disease noted within the lumbar spine. IMPRESSION: 1. Acute right posterior eighth and ninth rib fractures. No pneumothorax. 2. Bilateral lower lobe bronchial wall thickening and cylindrical bronchiectasis likely reflecting sequelae of  chronic inflammation. Recurrent aspiration not excluded. 3. Subsegmental atelectasis and subpleural thickening overlying both lower lobes likely reflecting dependent changes. 4. Aortic atherosclerosis. Aortic Atherosclerosis (ICD10-I70.0). Electronically Signed   By: Kerby Moors M.D.   On: 05/16/2021 20:12   CT Cervical Spine Wo Contrast  Result Date: 05/16/2021 CLINICAL DATA:  Recent fall with headaches and neck pain, initial encounter EXAM: CT HEAD WITHOUT CONTRAST CT CERVICAL SPINE WITHOUT CONTRAST TECHNIQUE: Multidetector CT imaging of the head and cervical spine was performed following the standard protocol without intravenous contrast. Multiplanar CT image reconstructions of the cervical spine were also generated. COMPARISON:  03/27/2010 FINDINGS: CT HEAD FINDINGS Brain: No evidence of acute infarction, hemorrhage, hydrocephalus, extra-axial collection or mass lesion/mass effect. Vascular: No hyperdense vessel or unexpected calcification. Skull: Normal. Negative for fracture or focal lesion. Sinuses/Orbits: No acute finding. Other: None. CT CERVICAL SPINE FINDINGS Alignment: Within normal limits. Skull base and vertebrae: 7 cervical segments are well visualized. Vertebral body height is well maintained. Disc space narrowing is noted at C5-6 with osteophytic changes. Multilevel facet hypertrophic changes are seen. No acute fracture or acute facet abnormality is noted. Soft tissues and spinal canal: Surrounding soft tissue structures show vascular calcification. No acute abnormality is noted. Upper chest: Visualized lung apices demonstrate some mild scarring. Other: None IMPRESSION: CT of the head: No acute intracranial abnormality noted. CT of the cervical spine: Multilevel degenerative change without acute abnormality. Electronically Signed   By: Inez Catalina M.D.   On: 05/16/2021 20:05   CT T-SPINE NO CHARGE  Result Date: 05/16/2021 CLINICAL DATA:  Fall EXAM: CT Thoracic and Lumbar spine without  contrast TECHNIQUE: Multiplanar CT images of the thoracic and lumbar spine were reconstructed from contemporary CT of the Chest, Abdomen, and Pelvis CONTRAST:  None COMPARISON:  None FINDINGS: CT THORACIC SPINE Alignment: No significant listhesis. Vertebrae: No acute fracture. Degenerative endplate irregularity. No destructive osseous lesion. Paraspinal and other soft tissues: Extra-spinal findings are better evaluated on concurrent dedicated imaging. No paraspinal hematoma. Disc levels: Degenerative changes are present without high-grade osseous encroachment on the spinal canal. CT LUMBAR SPINE Segmentation: 5 lumbar type vertebrae. Alignment: No significant listhesis. Vertebrae: No acute fracture. Degenerative endplate irregularity. No destructive osseous lesion. Paraspinal and other soft tissues: Extra-spinal findings are better evaluated on concurrent dedicated imaging. No paraspinal hematoma. Disc  levels: Multilevel degenerative changes are present with disc space narrowing, endplate osteophytes, ligamentum flavum thickening and facet hypertrophy. There is no high-grade osseous encroachment on the spinal canal. Disc protrusion is suspected at L4-L5 with spinal canal narrowing. IMPRESSION: No acute fracture of the thoracolumbar spine. Electronically Signed   By: Macy Mis M.D.   On: 05/16/2021 20:03   CT L-SPINE NO CHARGE  Result Date: 05/16/2021 CLINICAL DATA:  Fall EXAM: CT Thoracic and Lumbar spine without contrast TECHNIQUE: Multiplanar CT images of the thoracic and lumbar spine were reconstructed from contemporary CT of the Chest, Abdomen, and Pelvis CONTRAST:  None COMPARISON:  None FINDINGS: CT THORACIC SPINE Alignment: No significant listhesis. Vertebrae: No acute fracture. Degenerative endplate irregularity. No destructive osseous lesion. Paraspinal and other soft tissues: Extra-spinal findings are better evaluated on concurrent dedicated imaging. No paraspinal hematoma. Disc levels:  Degenerative changes are present without high-grade osseous encroachment on the spinal canal. CT LUMBAR SPINE Segmentation: 5 lumbar type vertebrae. Alignment: No significant listhesis. Vertebrae: No acute fracture. Degenerative endplate irregularity. No destructive osseous lesion. Paraspinal and other soft tissues: Extra-spinal findings are better evaluated on concurrent dedicated imaging. No paraspinal hematoma. Disc levels: Multilevel degenerative changes are present with disc space narrowing, endplate osteophytes, ligamentum flavum thickening and facet hypertrophy. There is no high-grade osseous encroachment on the spinal canal. Disc protrusion is suspected at L4-L5 with spinal canal narrowing. IMPRESSION: No acute fracture of the thoracolumbar spine. Electronically Signed   By: Macy Mis M.D.   On: 05/16/2021 20:03    EKG: I independently viewed the EKG done and my findings are as followed: Sinus rhythm rate of 78.  Nonspecific ST-T changes.  QTc 470  Assessment/Plan Present on Admission:  Syncope  Active Problems:   Syncope  Recurrent syncope suspect related to orthostatic hypotension, autonomic dysfunction from Parkinson disease. Obtain orthostatic vital signs daily x2 days Obtain 2D echo Maintain MAP greater than 65 Start gentle IV fluid hydration, add midodrine if hypotensive. Closely monitor vital signs.  Presumptive UTI UA positive for pyuria on admission. Follow urine culture for ID and sensitivities, DC IV antibiotics if negative. Started on Rocephin in the ED, continue.  Right posterior 8th and 9th ribs fracture post fall, POA Pain control with IV dilaudid prn for severe pain Oxycodone prn for moderate pain Tylenol prn for mild pain Bowel regimen to avoid opiate induced constipation Incentive spirometer  Parkinson disease Patient has a scheduled deep brain stimulator placement on 07/07/21 at [facility] Neurology consult Resume home regimen Fall/aspiration  precautions  History of orthostatic hypotension secondary to Parkinson's disease Follows with neurology outpatient Dr. Carles Collet May consider midodrine if hypotensive. Maintain MAP>65 Rest of management as stated above PT OT evaluation Fall precautions  Chronic normocytic anemia Hemoglobin stable 11.9 No overt bleeding Last colonoscopy was on 05/12/2021 with polyp removal GI recommendations: No NSAIDs for 2 weeks.  Chronic anxiety/depression She is on home Klonopin 0.5 mg twice daily which can increase fall risk, defer to neurology to taper off if indicated. Continue home Klonopin and Zoloft  Ambulatory dysfunction with Parkinson disease PT OT evaluation Fall precautions TOC consult to assist with home health services for dc planning.   DVT prophylaxis: Subcu Lovenox daily  Code Status: Full code  Family Communication: Son at bedside.  Presented the plan, they both agreed.   Disposition Plan: Admit to telemetry medical  Consults called: Neurology  Admission status: Observation status.   Status is: Observation    Dispo:  Patient From: Home  Planned Disposition: Home possibly on 05/17/2021 or when neurology signs off.  Medically stable for discharge: No      Kayleen Memos MD [facility] Pager [phone]  If 7PM-7AM, please contact night-coverage [email or web address] Password [facility]  05/16/2021, 8:48 PM

## 2021-05-16 NOTE — ED Provider Notes (Signed)
[facility] EMERGENCY DEPARTMENT Provider Note   CSN: [id number] Arrival date & time: 05/16/21  1044     History Chief Complaint  Patient presents with   [name] is a 75 y.o. female.  The history is provided by the patient and a relative.  Fall This is a recurrent problem. The current episode started 12 to 24 hours ago. The problem occurs every several days. Associated symptoms include headaches. Pertinent negatives include no abdominal pain and no shortness of breath.      [name] is a 75 y.o. female, with a history of CAD, fibromyalgia, GERD, hyperlipidemia, hypotension, anxiety, Parkinson's, presenting to the ED with injuries from a fall that occurred yesterday. Patient states she was in the kitchen, standing at the refrigerator using her walker, when she fell to the floor.  She does not remember what made her fall.  She states she uses the walker due to a combination of unsteadiness due to her Parkinson's and history of syncopal episodes. Patient primarily complains of pain to the right side of the ribs.  Also notes headache, neck pain, back pain, right shoulder pain, and nausea. Patient was evaluated at urgent care yesterday following fall and was advised to come to the ED for further evaluation, citing their limitations for imaging studies and they also noted hypotension into the 35T systolic.  Patient went home and contacted her PCP this morning due to her worsening pain and continued symptoms.  She was again advised to go to the emergency department. She notes she underwent colonoscopy on June 16 and did not have pain or complaints following this procedure. Denies anticoagulation.  Denies vomiting, recent illness, fever, abdominal pain, other chest pain, shortness of breath, changes in bowel or bladder function, urinary symptoms, or any other complaints.   Past Medical History:  Diagnosis Date   Allergy    ANEMIA-NOS 09/25/2007    Anxiety    Arthritis    B12 DEFICIENCY 05/03/2007   Blood transfusion without reported diagnosis    CAD (coronary artery disease) 01/2010   MI, Nishan   Cardiomyopathy ([place]) 02/08/2010   H/o this 2012 after urosepsis, no recurrence.    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   OSTEOPOROSIS 08/2009   bisphosphonate on hold 2/2 dysphagia, on reclast done in August each year   Parkinson's disease ([place]) 08/25/2015   Dx Dr [name] 07/2015    PONV (postoperative nausea and vomiting)    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   Takotsubo cardiomyopathy 2008   due to E coli urosepsis    Patient Active Problem List   Diagnosis Date Noted   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/08/2021   Cough 12/20/2020   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   Left shoulder pain 06/19/2020   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   Degenerative lumbar spinal stenosis 11/27/2019   Knee  pain, bilateral 08/06/2018   Constipation 08/06/2018   Right lumbar radiculopathy 04/10/2018   Dysphagia 10/09/2017   Chronic cough 07/16/2017   GAD (generalized anxiety disorder) 04/28/2016   Chronic insomnia 03/07/2016   Left Achilles tendinitis 12/08/2015   Parkinson's disease ([place]) 08/25/2015   Advanced care planning/counseling discussion 05/26/2015   Family circumstance 02/23/2015   Health maintenance examination 05/21/2014   MDD (major depressive disorder), single episode, moderate ([place])  11/12/2013   DDD (degenerative disc disease), lumbar 05/31/2013   Medicare annual wellness visit, subsequent 05/20/2013   HLD (hyperlipidemia) 05/20/2013   Vitamin D deficiency 05/09/2013   Recurrent UTI 01/03/2011   Rash and other nonspecific skin eruption 05/11/2010   GERD 02/22/2010   CAD (coronary artery disease) 01/25/2010   Osteoporosis 11/10/2009   Orthostatic hypotension due to Parkinson's disease ([place]) 12/06/2008   Anemia 09/25/2007   Vitamin B12 deficiency 05/03/2007   Fibromyalgia 05/03/2007    Past Surgical History:  Procedure Laterality Date   ABDOMINAL HYSTERECTOMY  1970s   IUD infection - first partial then with oophorectomy (cysts), complication - low blood pressure   CATARACT EXTRACTION Bilateral    CHOLECYSTECTOMY     complication - low blood pressure   COLONOSCOPY  06/2008   h/o polyps but latest WNL, rec rpt 10 yrs Olevia Perches)   COLONOSCOPY  11/2018   multiple TAs (10 polyps total), rpt 2 yrs Fuller Plan)   CYSTOSCOPY  12/2013   abx treatment for recurrent cystitis   DEXA  04/2013   T -2.9 @ femur, -1.6 @ spine   DEXA  04/2017   T -2.9 hip, -0.7 spine   ESOPHAGOGASTRODUODENOSCOPY  12/2017   WNL, regardless esophagus dilated, small HH Fuller Plan)   LUMBAR LAMINECTOMY/DECOMPRESSION MICRODISCECTOMY Right 11/27/2019   Right Lumbar Four-Five foraminotomy;  Erline Levine, MD)   PTOSIS REPAIR Bilateral 10/2020   Plastic Surgery   UPPER GASTROINTESTINAL ENDOSCOPY       OB History   No obstetric history on file.     Family History  Problem Relation Age of Onset   Heart attack Father    Diabetes Father    Prostate cancer Father    Esophageal cancer Mother    Lung cancer Brother    Breast cancer Sister    Ovarian cancer Sister    Lung cancer Brother    CAD Brother    Uterine cancer Sister    Clotting disorder Son    Healthy Son    Healthy Daughter    Healthy Daughter    Colon cancer Neg Hx    Rectal cancer Neg Hx    Stomach cancer Neg Hx     Social  History   Tobacco Use   Smoking status: Never   Smokeless tobacco: Never  Vaping Use   Vaping Use: Never used  Substance Use Topics   Alcohol use: No   Drug use: Not Currently    Types: Nitrous oxide    Home Medications Prior to Admission medications   Medication Sig Start Date End Date Taking? Authorizing Provider  AMBULATORY NON FORMULARY MEDICATION Lift chair Dx:  G20 09/29/20   [name], Eustace Quail, DO  benzonatate (TESSALON) 200 MG capsule Take 1 capsule (200 mg total) by mouth 3 (three) times daily as needed for cough. Patient not taking: No sig reported 12/20/20   Pleas Koch, NP  Calcium Carbonate-Vitamin D (CALCIUM 600/VITAMIN D) 600-400 MG-UNIT chew tablet Chew 1 tablet by mouth daily. 12/10/17   [name], MD  carbidopa-levodopa (SINEMET CR) 50-200  MG tablet TAKE 1 TABLET BY MOUTH AT  BEDTIME 03/01/21   [name], Eustace Quail, DO  carbidopa-levodopa (SINEMET IR) 25-100 MG tablet 2 tablet at 6am/1 tablet at 9:30am/1pm/4:30pm.  May take an extra 1/2 tablet prn evening Patient taking differently: 2 tablet at 6am/1 tablet at 9:00am/1pm/4:30pm.  May take an extra 1/2 tablet prn evening 12/22/20   [name], Eustace Quail, DO  Cholecalciferol (VITAMIN D3) 25 MCG (1000 UT) CAPS Take 1 capsule (1,000 Units total) by mouth daily. 10/10/18   [name], MD  clonazePAM (KLONOPIN) 0.5 MG tablet TAKE 1 TABLET BY MOUTH  TWICE DAILY 05/11/21   [name], MD  cyanocobalamin (,VITAMIN B-12,) 1000 MCG/ML injection INJECT 1 ML (1,000 MCG TOTAL) INTO THE MUSCLE EVERY 30 (THIRTY) DAYS. 10/07/19   [name], MD  gabapentin (NEURONTIN) 100 MG capsule Take 2 capsules (200 mg total) by mouth at bedtime. 03/11/20   [name], MD  polyethylene glycol [facility] / Floria Raveling) packet Take 17 g by mouth as needed. 04/19/18   [name], MD  pramipexole (MIRAPEX) 0.5 MG tablet Take 1 tablet (0.5 mg total) by mouth 3 (three) times daily. 12/22/20   [name], Eustace Quail, DO  sertraline (ZOLOFT) 100  MG tablet TAKE 1 TABLET BY MOUTH  DAILY 05/04/21   [name], MD  traMADol (ULTRAM) 50 MG tablet Take 1 tablet (50 mg total) by mouth 3 (three) times daily as needed. 05/11/21   [name], MD  triamcinolone cream (KENALOG) 0.1 % APPLY TO AFFECTED AREA TWICE A DAY 07/16/19   [name], MD    Allergies    Amitriptyline, Ciprofloxacin, Cymbalta [duloxetine hcl], Imipramine hcl, Iohexol, Lyrica [pregabalin], Morphine sulfate, Sulfamethoxazole, Iodine, Lidocaine hcl, Neosporin [neomycin-bacitracin zn-polymyx], and Tetracyclines & related  Review of Systems   Review of Systems  Constitutional:  Negative for chills, diaphoresis and fever.  Respiratory:  Negative for cough and shortness of breath.   Gastrointestinal:  Positive for nausea. Negative for abdominal pain, blood in stool, diarrhea and vomiting.  Musculoskeletal:  Positive for arthralgias and back pain.       Right rib pain  Neurological:  Positive for syncope and headaches. Negative for numbness.  All other systems reviewed and are negative.  Physical Exam Updated Vital Signs BP (!) 169/149   Pulse (!) 49   Temp 97.9 F (36.6 C)   Resp (!) 23   SpO2 95%   Physical Exam Vitals and nursing note reviewed.  Constitutional:      General: She is not in acute distress.    Appearance: She is well-developed. She is not diaphoretic.  HENT:     Head: Normocephalic and atraumatic.     Mouth/Throat:     Mouth: Mucous membranes are moist.     Pharynx: Oropharynx is clear.  Eyes:     Conjunctiva/sclera: Conjunctivae normal.  Cardiovascular:     Rate and Rhythm: Normal rate and regular rhythm.     Pulses: Normal pulses.          Radial pulses are 2+ on the right side and 2+ on the left side.       Posterior tibial pulses are 2+ on the right side and 2+ on the left side.     Heart sounds: Normal heart sounds.     Comments: Tactile temperature in the extremities appropriate and equal bilaterally. Pulmonary:      Effort: Pulmonary effort is normal. No respiratory distress.     Breath sounds: Normal breath sounds.  Chest:  Chest wall: Tenderness present. No deformity, swelling or crepitus.    Abdominal:     Palpations: Abdomen is soft.     Tenderness: There is abdominal tenderness. There is no guarding.    Musculoskeletal:     Cervical back: Neck supple.     Right lower leg: No edema.     Left lower leg: No edema.     Comments: Tenderness to the right superior and posterior shoulder without noted swelling, deformity, instability. Tenderness along the back and posterior neck, especially on the right.  No noted midline spinal deformity, swelling, instability, color abnormality. Tenderness to the right lateral hip without noted deformity, instability, swelling, color abnormality.  Overall trauma exam performed without any abnormalities noted other than those mentioned.  Skin:    General: Skin is warm and dry.  Neurological:     Mental Status: She is alert.     Comments: Patient has frequent, seemingly random and uncontrollable movements of the upper extremities which she states is consistent with her parkinsonian dysfunction exacerbated by pain.  Psychiatric:        Mood and Affect: Mood and affect normal.        Speech: Speech normal.        Behavior: Behavior normal.    ED Results / Procedures / Treatments   Labs (all labs ordered are listed, but only abnormal results are displayed) Labs Reviewed  CBC WITH DIFFERENTIAL/PLATELET - Abnormal; Notable for the following components:      Result Value   Hemoglobin 11.9 (*)    MCHC 29.4 (*)    All other components within normal limits  CBG MONITORING, ED - Abnormal; Notable for the following components:   Glucose-Capillary 59 (*)    All other components within normal limits  RESP PANEL BY RT-PCR (FLU A&B, COVID) ARPGX2  COMPREHENSIVE METABOLIC PANEL  URINALYSIS, ROUTINE W REFLEX MICROSCOPIC  RAPID URINE DRUG SCREEN, HOSP PERFORMED   MAGNESIUM  SAMPLE TO BLOOD BANK  TROPONIN I (HIGH SENSITIVITY)    EKG EKG Interpretation  Date/Time:  Monday May 16 2021 14:17:29 EDT Ventricular Rate:  78 PR Interval:  211 QRS Duration: 101 QT Interval:  418 QTC Calculation: 477 R Axis:   -10 Text Interpretation: Sinus rhythm Interpretation limited secondary to artifact Confirmed by Calvert Cantor [phone]) on 05/16/2021 2:53:53 PM  Radiology DG Ribs Unilateral W/Chest Right  Result Date: 05/16/2021 CLINICAL DATA:  [place].  Right-sided chest pain. EXAM: RIGHT RIBS AND CHEST - 3+ VIEW COMPARISON:  None. FINDINGS: The cardiac silhouette, mediastinal and hilar contours are within normal limits. The lungs are clear of an acute process. No infiltrates, effusions or pneumothorax. The bony thorax is intact. There is a minimally displaced ninth rib fracture. An eighth rib fracture is also suspected. IMPRESSION: 1. No acute cardiopulmonary findings. 2. Right eighth and ninth rib fractures. Electronically Signed   By: Marijo Sanes M.D.   On: 05/16/2021 14:24   DG Thoracic Spine 2 View  Result Date: 05/16/2021 CLINICAL DATA:  [place].  Back pain. EXAM: THORACIC SPINE 2 VIEWS COMPARISON:  None. FINDINGS: Normal alignment of the thoracic vertebral bodies. Disc spaces and vertebral bodies are fairly well maintained. Mild degenerative changes. No acute compression fracture. No abnormal paraspinal soft tissue thickening. The visualized posterior ribs are intact. IMPRESSION: Mild degenerative changes but no acute bony findings. Electronically Signed   By: Marijo Sanes M.D.   On: 05/16/2021 14:39   DG Lumbar Spine Complete  Result Date: 05/16/2021 CLINICAL DATA:  [place].  Back pain. EXAM: LUMBAR SPINE - COMPLETE 4+ VIEW COMPARISON:  None. FINDINGS: The lumbar vertebral bodies are normally aligned. No acute fracture. The facets are normally aligned. No pars defects. Moderate degenerative disc disease noted mainly at L3-4 and L4-5. The visualized bony pelvis  is intact. IMPRESSION: 1. Normal alignment and no acute bony findings. 2. Moderate degenerative disc disease at L3-4 and L4-5. Electronically Signed   By: Marijo Sanes M.D.   On: 05/16/2021 14:37   DG Shoulder Right  Result Date: 05/16/2021 CLINICAL DATA:  [place].  Right shoulder pain. EXAM: RIGHT SHOULDER - 2+ VIEW COMPARISON:  None. FINDINGS: The glenohumeral and AC joints are intact. No acute shoulder fracture. The scapula appears intact. The right eighth and ninth rib fractures are again noted. No pneumothorax. IMPRESSION: 1. No fracture or dislocation of the right shoulder. 2. Right eighth and ninth rib fractures. Electronically Signed   By: Marijo Sanes M.D.   On: 05/16/2021 14:36    Procedures Procedures   Medications Ordered in ED Medications  sodium chloride 0.9 % bolus 1,000 mL (1,000 mLs Intravenous New Bag/Given 05/16/21 1444)  dextrose 50 % solution 12.5 g (12.5 g Intravenous Given 05/16/21 1445)  fentaNYL (SUBLIMAZE) injection 50 mcg (50 mcg Intravenous Given 05/16/21 1448)  ondansetron (ZOFRAN) injection 4 mg (4 mg Intravenous Given 05/16/21 1448)    ED Course  I have reviewed the triage vital signs and the nursing notes.  Pertinent labs & imaging results that were available during my care of the patient were reviewed by me and considered in my medical decision making (see chart for details).  Clinical Course as of 05/16/21 1542  Mon May 16, 2021  1445 BP(!): 169/149 Patient has frequent movements of the upper extremities making obtaining accurate blood pressure difficult. [SJ]    Clinical Course User Index [SJ] Temara Lanum, Helane Gunther, PA-C   MDM Rules/Calculators/A&P                          Patient presents for evaluation following a fall, possible syncope. Additionally, when patient was undergoing rib x-rays, supine on the table, it appeared as though she sustained another syncopal episode versus absence Seizure.  Due to the patient's location of pain and tenderness on exam, I  think she could benefit from additional, more advanced imaging from CT.  She has a listed allergenic reaction to IV dye, including hives and shortness of breath.  In a trauma evaluation, use of IV dye would be preferred, however, this would require delay in the patient's imaging studies while we pretreat. This delay was weighed against the patient's complaints, her presentation, and events here in the ED. Since patient had reported hypotension yesterday, increased pain today as well as abdominal, chest, and back tenderness on exam, combined with the patient's syncopal episode while supine during her rib x-ray, we thought it prudent to have the CTs performed more quickly than what could be accomplished with pretreatment.  This is why the CTs were performed without IV contrast.  Findings and plan of care discussed with attending physician, Calvert Cantor, MD. Dr. Karle Starch personally evaluated and examined this patient.  End of shift patient care handoff report given to Benedetto Goad, PA-C. Plan: Patient awaiting additional imaging studies and lab results.  Suspect admission will be the likely disposition for this patient.   Final Clinical Impression(s) / ED Diagnoses Final diagnoses:  Thoracic spine pain  Lumbar spine pain    Rx / DC  Orders ED Discharge Orders     None        Layla Maw 05/16/21 1551    Truddie Hidden, MD 05/17/21 1248

## 2021-05-16 NOTE — Telephone Encounter (Signed)
PLEASE NOTE: All timestamps contained within this report are represented as [place] Standard Time. CONFIDENTIALTY NOTICE: This fax transmission is intended only for the addressee. It contains information that is legally privileged, confidential or otherwise protected from use or disclosure. If you are not the intended recipient, you are strictly prohibited from reviewing, disclosing, copying using or disseminating any of this information or taking any action in reliance on or regarding this information. If you have received this fax in error, please notify us immediately by telephone so that we can arrange for its return to [place]. Phone: [phone], Toll-Free: [phone], Fax: [phone] Page: 1 of 2 Call Id: [id number] [facility] - Client TELEPHONE ADVICE RECORD [facility] Patient Name: [name] Gender: Female DOB: 1946/05/12 Age: 75 Y 10 M 20 D Return Phone Number: [phone] (Primary), [phone] (Secondary) Address: City/ [place]/ [place]  [address] Client [facility] - Client Client Site [place] - Day Physician [name] - MD Contact Type Call Who Is Calling Patient / Member / Family / Caregiver Call Type Triage / Clinical Relationship To Patient Self Return Phone Number [phone] (Primary) Chief Complaint Back Pain - General Reason for Call Symptomatic / Request for [place] states fell and hurt side; doesn't know why fell; BP dropped-later; went to [facility] an hour later yesterday; able to schedule tomorrow; no appts for today; severe pain RT back; Parkinson's; [place] [facility] Translation No Nurse Assessment Nurse: Thad Ranger, RN, Langley Gauss Date/Time (Eastern Time): 05/16/2021 9:18:59 AM Confirm and document reason for call. If symptomatic, describe symptoms. ---Pt fell yesterday and now has severe right sided  back pain. [facility] did not XRay and inst to see her PCP today. Does the patient have any new or worsening symptoms? ---Yes Will a triage be completed? ---Yes Related visit to physician within the last 2 weeks? ---Yes Does the PT have any chronic conditions? (i.e. diabetes, asthma, this includes High risk factors for pregnancy, etc.) ---Yes List chronic conditions. ---Parkinson's dx Is this a behavioral health or substance abuse call? ---No Guidelines Guideline Title Affirmed Question Affirmed Notes Nurse Date/Time (Eastern Time) Back Injury [1] SEVERE pain (e.g., excruciating) AND [2] not improved 2 hours after pain medicine/ ice packs Carmon, RN, Langley Gauss 05/16/2021 9:19:58 AM Disp. Time Eilene Ghazi Time) Disposition Final User PLEASE NOTE: All timestamps contained within this report are represented as [place] Standard Time. CONFIDENTIALTY NOTICE: This fax transmission is intended only for the addressee. It contains information that is legally privileged, confidential or otherwise protected from use or disclosure. If you are not the intended recipient, you are strictly prohibited from reviewing, disclosing, copying using or disseminating any of this information or taking any action in reliance on or regarding this information. If you have received this fax in error, please notify us immediately by telephone so that we can arrange for its return to [place]. Phone: [phone], Toll-Free: [phone], Fax: [phone] Page: 2 of 2 Call Id: [id number] 05/16/2021 9:23:21 AM See HCP within 4 Hours (or PCP triage) Yes Carmon, RN, Yevette Edwards Disagree/Comply Comply Caller Understands Yes PreDisposition Call Doctor Care Advice Given Per Guideline SEE HCP (OR PCP TRIAGE) WITHIN 4 HOURS: PAIN MEDICINES: * ACETAMINOPHEN - EXTRA STRENGTH TYLENOL: Take 1,000 mg (two 500 mg pills) every 8 hours as needed. Each Extra Strength Tylenol pill has 500 mg of acetaminophen. The most you should take each day  is 3,000 mg (6 pills  a day). * IBUPROFEN (E.G., MOTRIN, ADVIL): Take 400 mg (two 200 mg pills) by mouth every 6 hours. The most you should take each day is 1,200 mg (six 200 mg pills), unless your doctor has told you to take more. CALL BACK IF: * You become worse CARE ADVICE given per Back Injury (Adult) guideline. Referrals GO TO FACILITY OTHER - SPECIFY

## 2021-05-16 NOTE — ED Triage Notes (Signed)
Pt sent here from u[facility], reports having recent fall and having right lower back pain and shoulder pain. Bp was 84/68 at triage. Family reports +syncopal episode while in lobby.

## 2021-05-16 NOTE — ED Notes (Signed)
Family updated as to patient's status. Son is at the bedside.

## 2021-05-16 NOTE — ED Notes (Signed)
Patient transported to CT 

## 2021-05-16 NOTE — ED Notes (Signed)
Pt returned from CT °

## 2021-05-16 NOTE — ED Notes (Signed)
CT called patient is ready to go back and have scan, 50 mcg of Fentanyl given

## 2021-05-17 ENCOUNTER — Inpatient Hospital Stay (HOSPITAL_BASED_OUTPATIENT_CLINIC_OR_DEPARTMENT_OTHER): Payer: Medicare Other

## 2021-05-17 ENCOUNTER — Ambulatory Visit: Payer: Medicare Other | Admitting: Family Medicine

## 2021-05-17 ENCOUNTER — Encounter (HOSPITAL_COMMUNITY): Payer: Self-pay | Admitting: Internal Medicine

## 2021-05-17 ENCOUNTER — Encounter: Payer: Self-pay | Admitting: Gastroenterology

## 2021-05-17 DIAGNOSIS — I951 Orthostatic hypotension: Secondary | ICD-10-CM | POA: Diagnosis not present

## 2021-05-17 DIAGNOSIS — R55 Syncope and collapse: Secondary | ICD-10-CM

## 2021-05-17 DIAGNOSIS — Z0289 Encounter for other administrative examinations: Secondary | ICD-10-CM

## 2021-05-17 LAB — BASIC METABOLIC PANEL
Anion gap: 9 (ref 5–15)
BUN: 8 mg/dL (ref 8–23)
CO2: 26 mmol/L (ref 22–32)
Calcium: 8.7 mg/dL — ABNORMAL LOW (ref 8.9–10.3)
Chloride: 104 mmol/L (ref 98–111)
Creatinine, Ser: 0.6 mg/dL (ref 0.44–1.00)
GFR, Estimated: >60 mL/min (ref >60)
Glucose, Bld: 105 mg/dL — ABNORMAL HIGH (ref 70–99)
Potassium: 3.5 mmol/L (ref 3.5–5.1)
Sodium: 139 mmol/L (ref 135–145)

## 2021-05-17 LAB — CBC
HCT: 34.5 % — ABNORMAL LOW (ref 36.0–46.0)
Hemoglobin: 11.1 g/dL — ABNORMAL LOW (ref 12.0–15.0)
MCH: 28.8 pg (ref 26.0–34.0)
MCHC: 32.2 g/dL (ref 30.0–36.0)
MCV: 89.4 fL (ref 80.0–100.0)
Platelets: 245 10*3/uL (ref 150–400)
RBC: 3.86 MIL/uL — ABNORMAL LOW (ref 3.87–5.11)
RDW: 13.9 % (ref 11.5–15.5)
WBC: 6.4 10*3/uL (ref 4.0–10.5)
nRBC: 0 % (ref 0.0–0.2)

## 2021-05-17 LAB — CBG MONITORING, ED: Glucose-Capillary: 85 mg/dL (ref 70–99)

## 2021-05-17 LAB — ECHOCARDIOGRAM COMPLETE
Area-P 1/2: 4.31 cm2
S' Lateral: 2.4 cm

## 2021-05-17 LAB — PHOSPHORUS: Phosphorus: 3.7 mg/dL (ref 2.5–4.6)

## 2021-05-17 LAB — MAGNESIUM: Magnesium: 1.7 mg/dL (ref 1.7–2.4)

## 2021-05-17 MED ORDER — MIDODRINE HCL 5 MG PO TABS
5.0000 mg | ORAL_TABLET | Freq: Three times a day (TID) | ORAL | Status: DC
  Administered 2021-05-17 (×2): 5 mg via ORAL
  Filled 2021-05-17 (×2): qty 1

## 2021-05-17 MED ORDER — MIDODRINE HCL 5 MG PO TABS: 5.0000 mg | ORAL_TABLET | Freq: Three times a day (TID) | ORAL | 3 refills | Status: DC

## 2021-05-17 MED ORDER — MIDODRINE HCL 5 MG PO TABS: 5.0000 mg | ORAL_TABLET | Freq: Three times a day (TID) | ORAL | Status: DC

## 2021-05-17 MED ORDER — ACETAMINOPHEN ER 650 MG PO TBCR: 1300.0000 mg | EXTENDED_RELEASE_TABLET | Freq: Three times a day (TID) | ORAL | Status: DC | PRN

## 2021-05-17 NOTE — ED Notes (Signed)
Son at [facility]. Admitting finished at [facility]. Denies pain or other sx. Asking about her Mirapex, pharmacy notified. Alert, NAD, calm.

## 2021-05-17 NOTE — TOC CAGE-AID Note (Signed)
Transition of Care [facility]) - CAGE-AID Screening   Patient Details  Name: QUETZALLI CLOS MRN: [id number] Date of Birth: 05/13/1946  Transition of Care [facility] CM/SW Contact:    Clovis Cao, RN Phone Number: [phone] 05/17/2021, 11:33 AM   Clinical Narrative: Pt denies alcohol or drug use.   CAGE-AID Screening:    Have You Ever Felt You Ought to Cut Down on Your Drinking or Drug Use?: No Have People Annoyed You By Critizing Your Drinking Or Drug Use?: No Have You Felt Bad Or Guilty About Your Drinking Or Drug Use?: No Have You Ever Had a Drink or Used Drugs First Thing In The Morning to Steady Your Nerves or to Get Rid of a Hangover?: No CAGE-AID Score: 0  Substance Abuse Education Offered: No

## 2021-05-17 NOTE — Care Management Obs Status (Signed)
[place] NOTIFICATION   Patient Details  Name: [name] MRN: [id number] Date of Birth: Nov 25, 1946   Medicare Observation Status Notification Given:  Yes    Pollie Friar, RN 05/17/2021, 2:06 PM

## 2021-05-17 NOTE — Discharge Summary (Signed)
Physician Discharge Summary   [name]:[id number] DOB: 1946/07/10 DOA: 05/16/2021  PCP: [name], MD  Admit date: 05/16/2021 Discharge date: 05/17/2021   Admitted From: home Disposition:  home Discharging physician: Dwyane Dee, MD  Recommendations for Outpatient Follow-up:  Continue following with neurology Patient started on midodrine at discharge for orthostatic hypotension  Home Health:  Equipment/Devices:   Patient discharged to home in Discharge Condition: stable Risk of unplanned readmission score: Unplanned Admission- Pilot do not use: 12.68  CODE STATUS: Full Diet recommendation:  Diet Orders (From admission, onward)     Start     Ordered   05/17/21 0825  Diet regular Room service appropriate? No; Fluid consistency: Thin  Diet effective now       Question Answer Comment  Room service appropriate? No   Fluid consistency: Thin      05/17/21 0825   05/17/21 0000  Diet general        05/17/21 1342            Hospital Course:  [name] is a 75 y.o. female with medical history significant for Parkinson disease with scheduled deep brain stimulator placement in August 11,2022, orthostatic hypotension due to Parkinson's disease, chronic anxiety/depression, mild neurocognitive disorder due to Parkinson's disease, herpes simplex keratitis of right eye, who presented to [facility] ED from home after a fall yesterday evening.   Associated with dizziness, lightheadedness prior to falling.  Patient uses a walker at baseline.  She was standing at the refrigerator with the aid of her walker when she suddenly felt lightheaded and fell to the floor.  After her fall she had complained of right-sided rib pain, right sided back pain, and neck pain.    She was evaluated in urgent care following the fall and was advised to come to the ED for further evaluation.  She went home instead.  The following morning she contacted her PCP due to worsening pain.  She was again  advised to come to the ED for further evaluation.  Patient was brought into the ED for further evaluation.  While in the ED her son reported recurrent syncopal episodes lasting about a minute each.  Patient reported lightheadedness and dizziness prior to these events.    CT head, CT cervical spine, CT chest without contrast, CT abdomen pelvis without contrast revealed acute right posterior eighth and ninth rib fractures no pneumothorax.  Bilateral lower lobe bronchial wall thickening and cylindrical bronchiectasis likely reflecting sequela of chronic inflammation, recurrent aspiration not excluded.  Aortic atherosclerosis.  The CT head and cervical spine were without acute abnormalities.  UA was positive for pyuria.  She received a dose of Rocephin in the ED.  EDP requested admission for syncopal work-up.   She has previously been treated for neurogenic orthostatic hypotension with Northera.  Orthostatic blood pressures were positive when checked on admission.  She was started on midodrine and discharged with a prescription to continue. No home health/DME needs at discharge.   The patient's chronic medical conditions were treated accordingly per the patient's home medication regimen except as noted.  On day of discharge, patient was felt deemed stable for discharge. Patient/family member advised to call PCP or come back to ER if needed.   Principal Diagnosis: Syncope  Discharge Diagnoses: Active Hospital Problems   Diagnosis Date Noted   Orthostatic hypotension 12/06/2008    Resolved Hospital Problems   Diagnosis Date Noted Date Resolved   Syncope 05/16/2021 05/17/2021    Discharge Instructions  Diet general   Complete by: As directed    Increase activity slowly   Complete by: As directed       Allergies as of 05/17/2021       Reactions   Amitriptyline Other (See Comments)   Sedated next morning   Ciprofloxacin Nausea And Vomiting   Cymbalta [duloxetine Hcl] Other (See  Comments)   Worsened depression   Iohexol Other (See Comments)    Code: HIVES, Desc: PT developed 2 hives, followed by SOB, severe headache post 87cc's Omnipaque 300., Onset Date: [id number]   Lyrica [pregabalin] Other (See Comments)   Tried during hospitalization - unsure effects but unable to tolerate   Imipramine Hcl Rash   Iodine Rash   Lidocaine Hcl Rash   Morphine Sulfate Rash   Neosporin [neomycin-bacitracin Zn-polymyx] Rash, Other (See Comments)   Worsened skin breaking out   Sulfamethoxazole Rash   Tetracyclines & Related Rash        Medication List     STOP taking these medications    benzonatate 200 MG capsule Commonly known as: TESSALON   gabapentin 100 MG capsule Commonly known as: NEURONTIN   triamcinolone cream 0.1 % Commonly known as: KENALOG       TAKE these medications    acetaminophen 650 MG CR tablet Commonly known as: TYLENOL Take 2 tablets (1,300 mg total) by mouth every 8 (eight) hours as needed for pain. What changed:  when to take this reasons to take this   [place]ota chair Dx:  G20   Calcium Carbonate-Vitamin D 600-400 MG-UNIT chew tablet Commonly known as: Calcium 600/Vitamin D Chew 1 tablet by mouth daily.   carbidopa-levodopa 25-100 MG tablet Commonly known as: SINEMET IR 2 tablet at 6am/1 tablet at 9:30am/1pm/4:30pm.  May take an extra 1/2 tablet prn evening What changed:  how much to take how to take this when to take this additional instructions   carbidopa-levodopa 50-200 MG tablet Commonly known as: SINEMET CR TAKE 1 TABLET BY MOUTH AT  BEDTIME What changed: Another medication with the same name was changed. Make sure you understand how and when to take each.   clonazePAM 0.5 MG tablet Commonly known as: KLONOPIN TAKE 1 TABLET BY MOUTH  TWICE DAILY What changed:  how much to take how to take this when to take this additional instructions   cyanocobalamin 1000 MCG/ML  injection Commonly known as: (VITAMIN B-12) INJECT 1 ML (1,000 MCG TOTAL) INTO THE MUSCLE EVERY 30 (THIRTY) DAYS.   denosumab 60 MG/ML Sosy injection Commonly known as: PROLIA Inject 60 mg into the skin every 6 (six) months.   latanoprost 0.005 % ophthalmic solution Commonly known as: XALATAN Place 1 drop into both eyes at bedtime.   midodrine 5 MG tablet Commonly known as: PROAMATINE Take 1 tablet (5 mg total) by mouth 3 (three) times daily before meals.   polyethylene glycol 17 g packet Commonly known as: MIRALAX / GLYCOLAX Take 17 g by mouth as needed. What changed:  when to take this reasons to take this   pramipexole 0.5 MG tablet Commonly known as: MIRAPEX Take 1 tablet (0.5 mg total) by mouth 3 (three) times daily. What changed:  when to take this additional instructions   sertraline 100 MG tablet Commonly known as: ZOLOFT TAKE 1 TABLET BY MOUTH  DAILY What changed: when to take this   traMADol 50 MG tablet Commonly known as: ULTRAM Take 1 tablet (50 mg total) by mouth 3 (three) times daily  as needed. What changed: reasons to take this   Vitamin D3 25 MCG (1000 UT) Caps Take 1 capsule (1,000 Units total) by mouth daily.        Allergies  Allergen Reactions   Amitriptyline Other (See Comments)    Sedated next morning   Ciprofloxacin Nausea And Vomiting   Cymbalta [Duloxetine Hcl] Other (See Comments)    Worsened depression   Iohexol Other (See Comments)     Code: HIVES, Desc: PT developed 2 hives, followed by SOB, severe headache post 87cc's Omnipaque 300., Onset Date: 24401027    Lyrica [Pregabalin] Other (See Comments)    Tried during hospitalization - unsure effects but unable to tolerate   Imipramine Hcl Rash   Iodine Rash   Lidocaine Hcl Rash   Morphine Sulfate Rash   Neosporin [Neomycin-Bacitracin Zn-Polymyx] Rash and Other (See Comments)    Worsened skin breaking out   Sulfamethoxazole Rash   Tetracyclines & Related Rash     Consultations:   Discharge Exam: BP (!) 122/106 Comment: after ambulation  Pulse 93   Temp 97.9 F (36.6 C) (Oral)   Resp 18   SpO2 96%  General appearance: alert, cooperative, and no distress Head: Normocephalic, without obvious abnormality, atraumatic Eyes:  EOMI Lungs: clear to auscultation bilaterally Heart: regular rate and rhythm and S1, S2 normal Abdomen: normal findings: bowel sounds normal and soft, non-tender Extremities:  no edema Skin: mobility and turgor normal Neurologic: resting tremors noted in UE  The results of significant diagnostics from this hospitalization (including imaging, microbiology, ancillary and laboratory) are listed below for reference.   Microbiology: Recent Results (from the past 240 hour(s))  Resp Panel by RT-PCR (Flu A&B, Covid) Nasopharyngeal Swab     Status: None   Collection Time: 05/16/21  2:41 PM   Specimen: Nasopharyngeal Swab; Nasopharyngeal(NP) swabs in vial transport medium  Result Value Ref Range Status   SARS Coronavirus 2 by RT PCR NEGATIVE NEGATIVE Final    Comment: (NOTE) SARS-CoV-2 target nucleic acids are NOT DETECTED.  The SARS-CoV-2 RNA is generally detectable in upper respiratory specimens during the acute phase of infection. The lowest concentration of SARS-CoV-2 viral copies this assay can detect is 138 copies/mL. A negative result does not preclude SARS-Cov-2 infection and should not be used as the sole basis for treatment or other patient management decisions. A negative result may occur with  improper specimen collection/handling, submission of specimen other than nasopharyngeal swab, presence of viral mutation(s) within the areas targeted by this assay, and inadequate number of viral copies(<138 copies/mL). A negative result must be combined with clinical observations, patient history, and epidemiological information. The expected result is Negative.  Fact Sheet for Patients:   EntrepreneurPulse.com.au  Fact Sheet for Healthcare Providers:  IncredibleEmployment.be  This test is no t yet approved or cleared by the [place] FDA and  has been authorized for detection and/or diagnosis of SARS-CoV-2 by FDA under an Emergency Use Authorization (EUA). This EUA will remain  in effect (meaning this test can be used) for the duration of the COVID-19 declaration under Section 564(b)(1) of the Act, 21 U.S.C.section 360bbb-3(b)(1), unless the authorization is terminated  or revoked sooner.       Influenza A by PCR NEGATIVE NEGATIVE Final   Influenza B by PCR NEGATIVE NEGATIVE Final    Comment: (NOTE) The Xpert Xpress SARS-CoV-2/FLU/RSV plus assay is intended as an aid in the diagnosis of influenza from Nasopharyngeal swab specimens and should not be used as a sole basis  for treatment. Nasal washings and aspirates are unacceptable for Xpert Xpress SARS-CoV-2/FLU/RSV testing.  Fact Sheet for Patients: EntrepreneurPulse.com.au  Fact Sheet for Healthcare Providers: IncredibleEmployment.be  This test is not yet approved or cleared by the [place] FDA and has been authorized for detection and/or diagnosis of SARS-CoV-2 by FDA under an Emergency Use Authorization (EUA). This EUA will remain in effect (meaning this test can be used) for the duration of the COVID-19 declaration under Section 564(b)(1) of the Act, 21 U.S.C. section 360bbb-3(b)(1), unless the authorization is terminated or revoked.  Performed at [facility], [place] [address], [place], [place] [address]      Labs: BNP (last 3 results) No results for input(s): BNP in the last 8760 hours. Basic Metabolic Panel: Recent Labs  Lab 05/16/21 1315 05/16/21 1518 05/17/21 0029  NA 137  --  139  K 4.0  --  3.5  CL 99  --  104  CO2 28  --  26  GLUCOSE 92  --  105*  BUN 11  --  8  CREATININE 0.64  --  0.60  CALCIUM  9.6  --  8.7*  MG  --  1.7 1.7  PHOS  --   --  3.7   Liver Function Tests: Recent Labs  Lab 05/16/21 1315  AST 16  ALT <5  ALKPHOS 63  BILITOT 0.6  PROT 6.7  ALBUMIN 3.5   No results for input(s): LIPASE, AMYLASE in the last 168 hours. No results for input(s): AMMONIA in the last 168 hours. CBC: Recent Labs  Lab 05/16/21 1315 05/17/21 0029  WBC 7.8 6.4  NEUTROABS 4.8  --   HGB 11.9* 11.1*  HCT 40.5 34.5*  MCV 95.7 89.4  PLT 264 245   Cardiac Enzymes: No results for input(s): CKTOTAL, CKMB, CKMBINDEX, TROPONINI in the last 168 hours. BNP: Invalid input(s): POCBNP CBG: Recent Labs  Lab 05/16/21 1409 05/16/21 1719 05/16/21 1847 05/17/21 0523  GLUCAP 59* 58* 102* 85   D-Dimer No results for input(s): DDIMER in the last 72 hours. Hgb A1c No results for input(s): HGBA1C in the last 72 hours. Lipid Profile No results for input(s): CHOL, HDL, LDLCALC, TRIG, CHOLHDL, LDLDIRECT in the last 72 hours. Thyroid function studies No results for input(s): TSH, T4TOTAL, T3FREE, THYROIDAB in the last 72 hours.  Invalid input(s): FREET3 Anemia work up No results for input(s): VITAMINB12, FOLATE, FERRITIN, TIBC, IRON, RETICCTPCT in the last 72 hours. Urinalysis    Component Value Date/Time   COLORURINE YELLOW 05/16/2021 1434   APPEARANCEUR HAZY (A) 05/16/2021 1434   LABSPEC 1.005 05/16/2021 1434   PHURINE 7.0 05/16/2021 1434   GLUCOSEU 50 (A) 05/16/2021 1434   HGBUR SMALL (A) 05/16/2021 1434   HGBUR trace-lysed 09/02/2010 0954   BILIRUBINUR NEGATIVE 05/16/2021 1434   BILIRUBINUR negative 02/07/2021 1420   KETONESUR 5 (A) 05/16/2021 1434   PROTEINUR NEGATIVE 05/16/2021 1434   UROBILINOGEN 0.2 02/07/2021 1420   UROBILINOGEN 0.2 09/04/2010 1342   NITRITE POSITIVE (A) 05/16/2021 1434   LEUKOCYTESUR SMALL (A) 05/16/2021 1434   Sepsis Labs Invalid input(s): PROCALCITONIN,  WBC,  LACTICIDVEN Microbiology Recent Results (from the past 240 hour(s))  Resp Panel by RT-PCR  (Flu A&B, Covid) Nasopharyngeal Swab     Status: None   Collection Time: 05/16/21  2:41 PM   Specimen: Nasopharyngeal Swab; Nasopharyngeal(NP) swabs in vial transport medium  Result Value Ref Range Status   SARS Coronavirus 2 by RT PCR NEGATIVE NEGATIVE Final    Comment: (  NOTE) SARS-CoV-2 target nucleic acids are NOT DETECTED.  The SARS-CoV-2 RNA is generally detectable in upper respiratory specimens during the acute phase of infection. The lowest concentration of SARS-CoV-2 viral copies this assay can detect is 138 copies/mL. A negative result does not preclude SARS-Cov-2 infection and should not be used as the sole basis for treatment or other patient management decisions. A negative result may occur with  improper specimen collection/handling, submission of specimen other than nasopharyngeal swab, presence of viral mutation(s) within the areas targeted by this assay, and inadequate number of viral copies(<138 copies/mL). A negative result must be combined with clinical observations, patient history, and epidemiological information. The expected result is Negative.  Fact Sheet for Patients:  EntrepreneurPulse.com.au  Fact Sheet for Healthcare Providers:  IncredibleEmployment.be  This test is no t yet approved or cleared by the [place] FDA and  has been authorized for detection and/or diagnosis of SARS-CoV-2 by FDA under an Emergency Use Authorization (EUA). This EUA will remain  in effect (meaning this test can be used) for the duration of the COVID-19 declaration under Section 564(b)(1) of the Act, 21 U.S.C.section 360bbb-3(b)(1), unless the authorization is terminated  or revoked sooner.       Influenza A by PCR NEGATIVE NEGATIVE Final   Influenza B by PCR NEGATIVE NEGATIVE Final    Comment: (NOTE) The Xpert Xpress SARS-CoV-2/FLU/RSV plus assay is intended as an aid in the diagnosis of influenza from Nasopharyngeal swab specimens  and should not be used as a sole basis for treatment. Nasal washings and aspirates are unacceptable for Xpert Xpress SARS-CoV-2/FLU/RSV testing.  Fact Sheet for Patients: EntrepreneurPulse.com.au  Fact Sheet for Healthcare Providers: IncredibleEmployment.be  This test is not yet approved or cleared by the [place] FDA and has been authorized for detection and/or diagnosis of SARS-CoV-2 by FDA under an Emergency Use Authorization (EUA). This EUA will remain in effect (meaning this test can be used) for the duration of the COVID-19 declaration under Section 564(b)(1) of the Act, 21 U.S.C. section 360bbb-3(b)(1), unless the authorization is terminated or revoked.  Performed at [facility], [place] [address], [place], [place] [address]     Procedures/Studies: CT ABDOMEN PELVIS WO CONTRAST  Result Date: 05/16/2021 CLINICAL DATA:  Status post fall.  Right rib fractures. EXAM: CT CHEST, ABDOMEN AND PELVIS WITHOUT CONTRAST TECHNIQUE: Multidetector CT imaging of the chest, abdomen and pelvis was performed following the standard protocol without IV contrast. COMPARISON:  CT AP 04/12/2010 and CT angio chest from 09/22/2005 FINDINGS: CT CHEST FINDINGS Cardiovascular: Heart size normal. No pericardial effusion. Mild aortic atherosclerosis. Mediastinum/Nodes: No enlarged mediastinal, hilar, or axillary lymph nodes. Thyroid gland, trachea, and esophagus demonstrate no significant findings. Lungs/Pleura: Subpleural atelectasis and consolidation overlies both lower lobes. No pleural fluid identified. No bilateral lower lobe bronchial wall thickening and cylindrical bronchiectasis is identified likely reflecting sequelae of chronic inflammation. Recurrent aspiration not excluded. No pneumothorax identified. Biapical pleuroparenchymal scarring noted, right greater than left. Musculoskeletal: Acute right posterior eighth and ninth rib fractures identified. No  additional fractures. CT ABDOMEN PELVIS FINDINGS Hepatobiliary: No hepatic injury or perihepatic hematoma. Status post cholecystectomy. Chronic increase caliber of the common bile duct is unchanged. Pancreas: Unremarkable. No pancreatic ductal dilatation or surrounding inflammatory changes. Spleen: Normal in size without focal abnormality. Adrenals/Urinary Tract: No adrenal hemorrhage or renal injury identified. Bladder is unremarkable. Stomach/Bowel: Stomach is within normal limits. Appendix not confidently identified. No evidence of bowel wall thickening, distention, or inflammatory changes. Vascular/Lymphatic: Aortic atherosclerosis. No  aneurysm. No abdominopelvic adenopathy. Reproductive: The uterus is either atrophic or surgically absent. No adnexal mass noted. Other: No free fluid or fluid collections. Musculoskeletal: No acute or suspicious osseous findings. Degenerative disc disease noted within the lumbar spine. IMPRESSION: 1. Acute right posterior eighth and ninth rib fractures. No pneumothorax. 2. Bilateral lower lobe bronchial wall thickening and cylindrical bronchiectasis likely reflecting sequelae of chronic inflammation. Recurrent aspiration not excluded. 3. Subsegmental atelectasis and subpleural thickening overlying both lower lobes likely reflecting dependent changes. 4. Aortic atherosclerosis. Aortic Atherosclerosis (ICD10-I70.0). Electronically Signed   By: Kerby Moors M.D.   On: 05/16/2021 20:12   DG Ribs Unilateral W/Chest Right  Result Date: 05/16/2021 CLINICAL DATA:  [place].  Right-sided chest pain. EXAM: RIGHT RIBS AND CHEST - 3+ VIEW COMPARISON:  None. FINDINGS: The cardiac silhouette, mediastinal and hilar contours are within normal limits. The lungs are clear of an acute process. No infiltrates, effusions or pneumothorax. The bony thorax is intact. There is a minimally displaced ninth rib fracture. An eighth rib fracture is also suspected. IMPRESSION: 1. No acute cardiopulmonary  findings. 2. Right eighth and ninth rib fractures. Electronically Signed   By: Marijo Sanes M.D.   On: 05/16/2021 14:24   DG Thoracic Spine 2 View  Result Date: 05/16/2021 CLINICAL DATA:  [place].  Back pain. EXAM: THORACIC SPINE 2 VIEWS COMPARISON:  None. FINDINGS: Normal alignment of the thoracic vertebral bodies. Disc spaces and vertebral bodies are fairly well maintained. Mild degenerative changes. No acute compression fracture. No abnormal paraspinal soft tissue thickening. The visualized posterior ribs are intact. IMPRESSION: Mild degenerative changes but no acute bony findings. Electronically Signed   By: Marijo Sanes M.D.   On: 05/16/2021 14:39   DG Lumbar Spine Complete  Result Date: 05/16/2021 CLINICAL DATA:  [place].  Back pain. EXAM: LUMBAR SPINE - COMPLETE 4+ VIEW COMPARISON:  None. FINDINGS: The lumbar vertebral bodies are normally aligned. No acute fracture. The facets are normally aligned. No pars defects. Moderate degenerative disc disease noted mainly at L3-4 and L4-5. The visualized bony pelvis is intact. IMPRESSION: 1. Normal alignment and no acute bony findings. 2. Moderate degenerative disc disease at L3-4 and L4-5. Electronically Signed   By: Marijo Sanes M.D.   On: 05/16/2021 14:37   DG Shoulder Right  Result Date: 05/16/2021 CLINICAL DATA:  [place].  Right shoulder pain. EXAM: RIGHT SHOULDER - 2+ VIEW COMPARISON:  None. FINDINGS: The glenohumeral and AC joints are intact. No acute shoulder fracture. The scapula appears intact. The right eighth and ninth rib fractures are again noted. No pneumothorax. IMPRESSION: 1. No fracture or dislocation of the right shoulder. 2. Right eighth and ninth rib fractures. Electronically Signed   By: Marijo Sanes M.D.   On: 05/16/2021 14:36   CT Head Wo Contrast  Result Date: 05/16/2021 CLINICAL DATA:  Recent fall with headaches and neck pain, initial encounter EXAM: CT HEAD WITHOUT CONTRAST CT CERVICAL SPINE WITHOUT CONTRAST TECHNIQUE:  Multidetector CT imaging of the head and cervical spine was performed following the standard protocol without intravenous contrast. Multiplanar CT image reconstructions of the cervical spine were also generated. COMPARISON:  03/27/2010 FINDINGS: CT HEAD FINDINGS Brain: No evidence of acute infarction, hemorrhage, hydrocephalus, extra-axial collection or mass lesion/mass effect. Vascular: No hyperdense vessel or unexpected calcification. Skull: Normal. Negative for fracture or focal lesion. Sinuses/Orbits: No acute finding. Other: None. CT CERVICAL SPINE FINDINGS Alignment: Within normal limits. Skull base and vertebrae: 7 cervical segments are well visualized. Vertebral body height is well  maintained. Disc space narrowing is noted at C5-6 with osteophytic changes. Multilevel facet hypertrophic changes are seen. No acute fracture or acute facet abnormality is noted. Soft tissues and spinal canal: Surrounding soft tissue structures show vascular calcification. No acute abnormality is noted. Upper chest: Visualized lung apices demonstrate some mild scarring. Other: None IMPRESSION: CT of the head: No acute intracranial abnormality noted. CT of the cervical spine: Multilevel degenerative change without acute abnormality. Electronically Signed   By: Inez Catalina M.D.   On: 05/16/2021 20:05   CT Chest Wo Contrast  Result Date: 05/16/2021 CLINICAL DATA:  Status post fall.  Right rib fractures. EXAM: CT CHEST, ABDOMEN AND PELVIS WITHOUT CONTRAST TECHNIQUE: Multidetector CT imaging of the chest, abdomen and pelvis was performed following the standard protocol without IV contrast. COMPARISON:  CT AP 04/12/2010 and CT angio chest from 09/22/2005 FINDINGS: CT CHEST FINDINGS Cardiovascular: Heart size normal. No pericardial effusion. Mild aortic atherosclerosis. Mediastinum/Nodes: No enlarged mediastinal, hilar, or axillary lymph nodes. Thyroid gland, trachea, and esophagus demonstrate no significant findings. Lungs/Pleura:  Subpleural atelectasis and consolidation overlies both lower lobes. No pleural fluid identified. No bilateral lower lobe bronchial wall thickening and cylindrical bronchiectasis is identified likely reflecting sequelae of chronic inflammation. Recurrent aspiration not excluded. No pneumothorax identified. Biapical pleuroparenchymal scarring noted, right greater than left. Musculoskeletal: Acute right posterior eighth and ninth rib fractures identified. No additional fractures. CT ABDOMEN PELVIS FINDINGS Hepatobiliary: No hepatic injury or perihepatic hematoma. Status post cholecystectomy. Chronic increase caliber of the common bile duct is unchanged. Pancreas: Unremarkable. No pancreatic ductal dilatation or surrounding inflammatory changes. Spleen: Normal in size without focal abnormality. Adrenals/Urinary Tract: No adrenal hemorrhage or renal injury identified. Bladder is unremarkable. Stomach/Bowel: Stomach is within normal limits. Appendix not confidently identified. No evidence of bowel wall thickening, distention, or inflammatory changes. Vascular/Lymphatic: Aortic atherosclerosis. No aneurysm. No abdominopelvic adenopathy. Reproductive: The uterus is either atrophic or surgically absent. No adnexal mass noted. Other: No free fluid or fluid collections. Musculoskeletal: No acute or suspicious osseous findings. Degenerative disc disease noted within the lumbar spine. IMPRESSION: 1. Acute right posterior eighth and ninth rib fractures. No pneumothorax. 2. Bilateral lower lobe bronchial wall thickening and cylindrical bronchiectasis likely reflecting sequelae of chronic inflammation. Recurrent aspiration not excluded. 3. Subsegmental atelectasis and subpleural thickening overlying both lower lobes likely reflecting dependent changes. 4. Aortic atherosclerosis. Aortic Atherosclerosis (ICD10-I70.0). Electronically Signed   By: Kerby Moors M.D.   On: 05/16/2021 20:12   CT Cervical Spine Wo Contrast  Result  Date: 05/16/2021 CLINICAL DATA:  Recent fall with headaches and neck pain, initial encounter EXAM: CT HEAD WITHOUT CONTRAST CT CERVICAL SPINE WITHOUT CONTRAST TECHNIQUE: Multidetector CT imaging of the head and cervical spine was performed following the standard protocol without intravenous contrast. Multiplanar CT image reconstructions of the cervical spine were also generated. COMPARISON:  03/27/2010 FINDINGS: CT HEAD FINDINGS Brain: No evidence of acute infarction, hemorrhage, hydrocephalus, extra-axial collection or mass lesion/mass effect. Vascular: No hyperdense vessel or unexpected calcification. Skull: Normal. Negative for fracture or focal lesion. Sinuses/Orbits: No acute finding. Other: None. CT CERVICAL SPINE FINDINGS Alignment: Within normal limits. Skull base and vertebrae: 7 cervical segments are well visualized. Vertebral body height is well maintained. Disc space narrowing is noted at C5-6 with osteophytic changes. Multilevel facet hypertrophic changes are seen. No acute fracture or acute facet abnormality is noted. Soft tissues and spinal canal: Surrounding soft tissue structures show vascular calcification. No acute abnormality is noted. Upper chest: Visualized lung apices demonstrate some  mild scarring. Other: None IMPRESSION: CT of the head: No acute intracranial abnormality noted. CT of the cervical spine: Multilevel degenerative change without acute abnormality. Electronically Signed   By: Inez Catalina M.D.   On: 05/16/2021 20:05   CT T-SPINE NO CHARGE  Result Date: 05/16/2021 CLINICAL DATA:  Fall EXAM: CT Thoracic and Lumbar spine without contrast TECHNIQUE: Multiplanar CT images of the thoracic and lumbar spine were reconstructed from contemporary CT of the Chest, Abdomen, and Pelvis CONTRAST:  None COMPARISON:  None FINDINGS: CT THORACIC SPINE Alignment: No significant listhesis. Vertebrae: No acute fracture. Degenerative endplate irregularity. No destructive osseous lesion. Paraspinal  and other soft tissues: Extra-spinal findings are better evaluated on concurrent dedicated imaging. No paraspinal hematoma. Disc levels: Degenerative changes are present without high-grade osseous encroachment on the spinal canal. CT LUMBAR SPINE Segmentation: 5 lumbar type vertebrae. Alignment: No significant listhesis. Vertebrae: No acute fracture. Degenerative endplate irregularity. No destructive osseous lesion. Paraspinal and other soft tissues: Extra-spinal findings are better evaluated on concurrent dedicated imaging. No paraspinal hematoma. Disc levels: Multilevel degenerative changes are present with disc space narrowing, endplate osteophytes, ligamentum flavum thickening and facet hypertrophy. There is no high-grade osseous encroachment on the spinal canal. Disc protrusion is suspected at L4-L5 with spinal canal narrowing. IMPRESSION: No acute fracture of the thoracolumbar spine. Electronically Signed   By: Macy Mis M.D.   On: 05/16/2021 20:03   CT L-SPINE NO CHARGE  Result Date: 05/16/2021 CLINICAL DATA:  Fall EXAM: CT Thoracic and Lumbar spine without contrast TECHNIQUE: Multiplanar CT images of the thoracic and lumbar spine were reconstructed from contemporary CT of the Chest, Abdomen, and Pelvis CONTRAST:  None COMPARISON:  None FINDINGS: CT THORACIC SPINE Alignment: No significant listhesis. Vertebrae: No acute fracture. Degenerative endplate irregularity. No destructive osseous lesion. Paraspinal and other soft tissues: Extra-spinal findings are better evaluated on concurrent dedicated imaging. No paraspinal hematoma. Disc levels: Degenerative changes are present without high-grade osseous encroachment on the spinal canal. CT LUMBAR SPINE Segmentation: 5 lumbar type vertebrae. Alignment: No significant listhesis. Vertebrae: No acute fracture. Degenerative endplate irregularity. No destructive osseous lesion. Paraspinal and other soft tissues: Extra-spinal findings are better evaluated on  concurrent dedicated imaging. No paraspinal hematoma. Disc levels: Multilevel degenerative changes are present with disc space narrowing, endplate osteophytes, ligamentum flavum thickening and facet hypertrophy. There is no high-grade osseous encroachment on the spinal canal. Disc protrusion is suspected at L4-L5 with spinal canal narrowing. IMPRESSION: No acute fracture of the thoracolumbar spine. Electronically Signed   By: Macy Mis M.D.   On: 05/16/2021 20:03   ECHOCARDIOGRAM COMPLETE  Result Date: 05/17/2021    ECHOCARDIOGRAM REPORT   Patient Name:   [name] Date of Exam: 05/17/2021 Medical Rec #:  [id number]     Height:       65.0 in Accession #:    [id number]    Weight:       148.0 lb Date of Birth:  1946/06/23     BSA:          1.741 m Patient Age:    75 years      BP:           122/106 mmHg Patient Gender: F             HR:           93 bpm. Exam Location:  Inpatient Procedure: 2D Echo, Cardiac Doppler, Color Doppler and 3D Echo Indications:    Syncope R55  History:  Patient has no prior history of Echocardiogram examinations.                 Previous Myocardial Infarction and CAD; Risk                 Factors:Dyslipidemia. Orthostatic hypotension due to Parkinson's                 disease. GERD. Lupis.  Sonographer:    Darlina Sicilian RDCS Referring Phys: [id number] [place]  1. Left ventricular ejection fraction, by estimation, is 50 to 55%. The left ventricle has low normal function. The left ventricle has no regional wall motion abnormalities. Left ventricular diastolic parameters are consistent with Grade I diastolic dysfunction (impaired relaxation).  2. Right ventricular systolic function is normal. The right ventricular size is normal.  3. The mitral valve is normal in structure. No evidence of mitral valve regurgitation. No evidence of mitral stenosis.  4. The aortic valve is tricuspid. Aortic valve regurgitation is not visualized. No aortic stenosis is present.  5.  The inferior vena cava is normal in size with greater than 50% respiratory variability, suggesting right atrial pressure of 3 mmHg. FINDINGS  Left Ventricle: Left ventricular ejection fraction, by estimation, is 50 to 55%. The left ventricle has low normal function. The left ventricle has no regional wall motion abnormalities. The left ventricular internal cavity size was normal in size. There is no left ventricular hypertrophy. Left ventricular diastolic parameters are consistent with Grade I diastolic dysfunction (impaired relaxation). Right Ventricle: The right ventricular size is normal. Right ventricular systolic function is normal. Left Atrium: Left atrial size was normal in size. Right Atrium: Right atrial size was normal in size. Pericardium: There is no evidence of pericardial effusion. Mitral Valve: The mitral valve is normal in structure. No evidence of mitral valve regurgitation. No evidence of mitral valve stenosis. Tricuspid Valve: The tricuspid valve is normal in structure. Tricuspid valve regurgitation is not demonstrated. No evidence of tricuspid stenosis. Aortic Valve: The aortic valve is tricuspid. Aortic valve regurgitation is not visualized. No aortic stenosis is present. Pulmonic Valve: The pulmonic valve was not well visualized. Pulmonic valve regurgitation is not visualized. No evidence of pulmonic stenosis. Aorta: The aortic root is normal in size and structure. Venous: The inferior vena cava is normal in size with greater than 50% respiratory variability, suggesting right atrial pressure of 3 mmHg. IAS/Shunts: The interatrial septum was not well visualized.  LEFT VENTRICLE PLAX 2D LVIDd:         3.60 cm  Diastology LVIDs:         2.40 cm  LV e' medial:    5.20 cm/s LV PW:         0.90 cm  LV E/e' medial:  10.7 LV IVS:        0.90 cm  LV e' lateral:   6.84 cm/s LVOT diam:     1.80 cm  LV E/e' lateral: 8.1 LV SV:         34 LV SV Index:   19 LVOT Area:     2.54 cm                          3D  Volume EF:                         3D EF:        56 %  LV EDV:       118 ml                         LV ESV:       52 ml                         LV SV:        66 ml RIGHT VENTRICLE RV S prime:     11.60 cm/s TAPSE (M-mode): 1.4 cm LEFT ATRIUM             Index      RIGHT ATRIUM          Index LA diam:        2.20 cm 1.26 cm/m RA Area:     7.74 cm LA Vol (A2C):   15.3 ml 8.79 ml/m RA Volume:   12.60 ml 7.24 ml/m LA Vol (A4C):   12.7 ml 7.30 ml/m LA Biplane Vol: 14.7 ml 8.45 ml/m  AORTIC VALVE LVOT Vmax:   73.70 cm/s LVOT Vmean:  46.400 cm/s LVOT VTI:    0.132 m  AORTA Ao Root diam: 3.40 cm MITRAL VALVE MV Area (PHT): 4.31 cm    SHUNTS MV Decel Time: 176 msec    Systemic VTI:  0.13 m MV E velocity: 55.40 cm/s  Systemic Diam: 1.80 cm MV A velocity: 50.70 cm/s MV E/A ratio:  1.09 Kirk Ruths MD Electronically signed by Kirk Ruths MD Signature Date/Time: 05/17/2021/2:54:18 PM    Final      Time coordinating discharge: Over 30 minutes    Dwyane Dee, MD  [facility] 05/17/2021, 3:53 PM

## 2021-05-17 NOTE — Evaluation (Signed)
Occupational Therapy Evaluation Patient Details Name: ERYNN VACA MRN: [id number] DOB: May 21, 1946 Today's Date: 05/17/2021    History of Present Illness 75 y.o. female presenting to ED 6/20 s/p fall associated with dizziness/lightheadedness. Imaging (+) acute R posterior 8th and 9th rib fx. No pneumothorax. PMHx significant for Parkinsons disease w/ scheduled brain stimulator placement 06/2021, Hx of orthostatic hypotension, anxiety/depression, RUE/RLE reflex sympathetic dystrophy, osteoporosis, lupus, fibromyalgia, CHF and OA.   Clinical Impression   PTA patient was living with family in a private residence. Patient and son present at bedside report patient has 24hr supervision/assist from family. Patient currently functioning at baseline per sons report. Education provided on continued supervision/assist, monitoring symptoms of hypotension with change in position, and increasing independence and decreasing spillage during self-feeding tasks by choosing "finger foods". Patient and son present at bedside expressed verbal understanding. Patient does not require continued acute occupational therapy services at this time with OT to sign off.      Follow Up Recommendations  No OT follow up;Supervision/Assistance - 24 hour    Equipment Recommendations  None recommended by OT (Patient has necessary DME)    Recommendations for Other Services       Precautions / Restrictions Precautions Precautions: Fall;Other (comment) (Prior to fall leading to this admission, patient without falls x1 year.) Precaution Comments: Monitor orthostatics Restrictions Weight Bearing Restrictions: No      Mobility Bed Mobility               General bed mobility comments: Seated EOB with PT upon entry.    Transfers Overall transfer level: Needs assistance Equipment used: Rolling walker (2 wheeled) Transfers: Sit to/from Stand Sit to Stand: Min guard;Min assist              Balance Overall  balance assessment: History of Falls;Needs assistance Sitting-balance support: Single extremity supported;Bilateral upper extremity supported;Feet supported Sitting balance-Leahy Scale: Fair     Standing balance support: Bilateral upper extremity supported;During functional activity Standing balance-Leahy Scale: Poor Standing balance comment: Parkinsonian-like movements. Per son, patient due for meds.                           ADL either performed or assessed with clinical judgement   ADL Overall ADL's : Needs assistance/impaired     Grooming: Set up;Sitting Grooming Details (indicate cue type and reason): 1/3 grooming tasks seated EOB with set-up assist. Pain with use of RUE so patient used non-dominant LUE.             Lower Body Dressing: Minimal assistance;Sit to/from stand Lower Body Dressing Details (indicate cue type and reason): Patient able to adjust footwear seated EOB with supervision A and increased time/effort 2/2 pain in R flank. Toilet Transfer: Designer, television/film set Details (indicate cue type and reason): Simulated with transfer to recliner with use of RW and min guard.         Functional mobility during ADLs: Min guard;Rolling walker General ADL Comments: Patient limited by pain in R ribs. Per son present at bedside, patient functioning at baseline.     Vision         Perception     Praxis      Pertinent Vitals/Pain Pain Assessment: Faces Faces Pain Scale: Hurts little more Pain Location: R ribs (espeically with movement of RUE) Pain Descriptors / Indicators: Aching Pain Intervention(s): Limited activity within patient's tolerance;Monitored during session;Repositioned     Hand Dominance Right   Extremity/Trunk Assessment  Upper Extremity Assessment Upper Extremity Assessment: RUE deficits/detail RUE Deficits / Details: AROM WFL although painful.   Lower Extremity Assessment Lower Extremity Assessment: Defer to PT  evaluation       Communication Communication Communication: No difficulties   Cognition Arousal/Alertness: Awake/alert Behavior During Therapy: WFL for tasks assessed/performed Overall Cognitive Status: Within Functional Limits for tasks assessed                                     General Comments  BP 112/76 in supine, 92/54 seated EOB and 128/98 in standing.    Exercises     Shoulder Instructions      Home Living Family/patient expects to be discharged to:: Private residence Living Arrangements: Spouse/significant other Available Help at Discharge: Family;Available 24 hours/day Type of Home: House Home Access: Stairs to enter [employer] of Steps: 1 (small porch step) Entrance Stairs-Rails: Left;Right;Can reach both Home Layout: Two level;Able to live on main level with bedroom/bathroom     Bathroom Shower/Tub:  (sponge bathes)   Bathroom Toilet: Standard     Home Equipment: Bedside commode;Cane - single point;Walker - 4 wheels;Wheelchair - manual;Shower seat          Prior Functioning/Environment Level of Independence: Needs assistance  Gait / Transfers Assistance Needed: Walks with walker, family provides supervision ADL's / Homemaking Assistance Needed: Supervision provided to perform ADL tasks; patient does some cooking            OT Problem List:        OT Treatment/Interventions:      OT Goals(Current goals can be found in the care plan section) Acute Rehab OT Goals Patient Stated Goal: To return home today. OT Goal Formulation: With patient/family  OT Frequency:     Barriers to D/C:            Co-evaluation PT/OT/SLP Co-Evaluation/Treatment: Yes Reason for Co-Treatment: Complexity of the patient's impairments (multi-system involvement);For patient/therapist safety   OT goals addressed during session: ADL's and self-care      AM-PAC OT "6 Clicks" Daily Activity     Outcome Measure Help from another person  eating meals?: A Little Help from another person taking care of personal grooming?: A Little Help from another person toileting, which includes using toliet, bedpan, or urinal?: A Little Help from another person bathing (including washing, rinsing, drying)?: A Little Help from another person to put on and taking off regular upper body clothing?: A Little Help from another person to put on and taking off regular lower body clothing?: A Little 6 Click Score: 18   End of Session Equipment Utilized During Treatment: Gait belt;Rolling walker Nurse Communication: Patient requests pain meds;Mobility status;Other (comment) (Response to treatment)  Activity Tolerance: Patient tolerated treatment well Patient left: in chair;with call bell/phone within reach;with chair alarm set                   Time: 4970-2637 OT Time Calculation (min): 29 min Charges:  OT General Charges $OT Visit: 1 Visit OT Evaluation $OT Eval Moderate Complexity: 1 Mod  Zoanne Newill H. OTR/L Supplemental OT, Department of rehab services [phone]  Cristiana Yochim R H. 05/17/2021, 12:50 PM

## 2021-05-17 NOTE — Care Management CC44 (Signed)
Condition Code 44 Documentation Completed  Patient Details  Name: BRYELLA DIVINEY MRN: [id number] Date of Birth: 10-01-1946   Condition Code 44 given:  Yes Patient signature on Condition Code 44 notice:  Yes Documentation of 2 MD's agreement:  Yes Code 44 added to claim:  Yes    Pollie Friar, RN 05/17/2021, 2:06 PM

## 2021-05-17 NOTE — ED Notes (Signed)
Breakfast Orders placed 

## 2021-05-17 NOTE — TOC Transition Note (Signed)
Transition of Care [facility]) - CM/SW Discharge Note   Patient Details  Name: [name] MRN: [id number] Date of Birth: Apr 30, 1946  [facility] CM/SW Contact:  Pollie Friar, RN Phone Number: 05/17/2021, 2:08 PM   Clinical Narrative:    Patient discharging home with son. Pt states she lives with her son and daughters. She has all needed DME at home.  Pt denies issues with home medications or transportation.  Son to provide transport home.   Final next level of care: Home/Self Care Barriers to Discharge: No Barriers Identified   Patient Goals and CMS Choice        Discharge Placement                       Discharge Plan and Services                                     Social Determinants of Health (SDOH) Interventions     Readmission Risk Interventions No flowsheet data found.

## 2021-05-17 NOTE — Progress Notes (Signed)
05/17/21 1314  PT Visit Information  Last PT Received On 05/17/21  Assistance Needed +1  PT/OT/SLP Co-Evaluation/Treatment Yes  Reason for Co-Treatment Complexity of the patient's impairments (multi-system involvement);For patient/therapist safety  PT goals addressed during session Balance;Mobility/safety with mobility  History of Present Illness 75 y.o. female presenting to ED 6/20 s/p fall associated with dizziness/lightheadedness. Imaging (+) acute R posterior 8th and 9th rib fx. No pneumothorax. PMHx significant for Parkinsons disease w/ scheduled brain stimulator placement 06/2021, Hx of orthostatic hypotension, anxiety/depression, RUE/RLE reflex sympathetic dystrophy, osteoporosis, lupus, fibromyalgia, CHF and OA.  Precautions  Precautions Fall;Other (comment) (Prior to fall leading to this admission, patient without falls x1 year.)  Precaution Comments Monitor orthostatics  Restrictions  Weight Bearing Restrictions No  Home Living  Family/patient expects to be discharged to: Private residence  Living Arrangements Spouse/significant other  Available Help at Discharge Family;Available 24 hours/day  Type of Home House  Home Access Stairs to enter  Entrance Stairs-Number of Steps 1 (small porch step)  Entrance Stairs-Rails Left;Right;Can reach both  Home Layout Two level;Able to live on main level with bedroom/bathroom  Bathroom Shower/Tub  (sponge bathes)  Oncologist - single point;Walker - 4 wheels;Wheelchair - Brewing technologist  Prior Function  Level of Independence Needs assistance  Gait / Transfers Assistance Needed Walks with walker, family provides supervision  ADL's / Wallace provided to perform ADL tasks; wears briefs; patient does some cooking  Communication  Communication No difficulties  Pain Assessment  Pain Assessment Faces  Faces Pain Scale 4  Pain Location R ribs (espeically with  movement of RUE)  Pain Descriptors / Indicators Aching  Pain Intervention(s) Monitored during session;Limited activity within patient's tolerance;Repositioned  Cognition  Arousal/Alertness Awake/alert  Behavior During Therapy WFL for tasks assessed/performed  Overall Cognitive Status Within Functional Limits for tasks assessed  Upper Extremity Assessment  Upper Extremity Assessment Defer to OT evaluation  Lower Extremity Assessment  Lower Extremity Assessment Generalized weakness;RLE deficits/detail;LLE deficits/detail  RLE Deficits / Details Dyskinesias noted in RLE>LLE secondary to parkinson's  LLE Deficits / Details Dyskinesias noted in RLE>LLE secondary to parkinson's  Bed Mobility  Overal bed mobility Needs Assistance  Bed Mobility Rolling;Sidelying to Sit  Rolling Min guard  Sidelying to sit Min assist  General bed mobility comments Required min A for trunk elevation to come to sitting. Increased time secondary to pain.  Transfers  Overall transfer level Needs assistance  Equipment used Rolling walker (2 wheeled)  Transfers Sit to/from Stand  Sit to Stand Min assist  General transfer comment Min A for steadying assist to stand.  Ambulation/Gait  Ambulation/Gait assistance Min guard  Gait Distance (Feet) 15 Feet  Assistive device Rolling walker (2 wheeled)  Gait Pattern/deviations Step-through pattern;Decreased stride length  General Gait Details Pt requiring min guard A for safety to ambulate around the room. Mild dizziness reported, however, BP WFL. Writhing type movements when taking steps with RLE, but pt and family reports this as baseline secondary to Parkinson's  Gait velocity Decreased  Balance  Overall balance assessment History of Falls;Needs assistance  Sitting-balance support Single extremity supported;Bilateral upper extremity supported;Feet supported  Sitting balance-Leahy Scale Fair  Standing balance support Bilateral upper extremity supported;During functional  activity  Standing balance-Leahy Scale Poor  Standing balance comment Parkinsonian-like movements. Per son, patient due for meds.  General Comments  General comments (skin integrity, edema, etc.) BP 112/76 in supine, 92/54 seated EOB and 128/98 in standing.  PT -  End of Session  Equipment Utilized During Treatment Gait belt  Activity Tolerance Patient tolerated treatment well  Patient left in chair;with call bell/phone within reach;with chair alarm set;with nursing/sitter in room  Nurse Communication Mobility status;Other (comment) (Pt requesting parkinson's meds)  PT Assessment  PT Recommendation/Assessment Patient needs continued PT services  PT Visit Diagnosis Unsteadiness on feet (R26.81);Muscle weakness (generalized) (M62.81);Difficulty in walking, not elsewhere classified (R26.2)  PT Problem List Decreased strength;Decreased balance;Decreased mobility;Decreased coordination;Decreased activity tolerance  PT Plan  PT Frequency (ACUTE ONLY) Min 3X/week  PT Treatment/Interventions (ACUTE ONLY) DME instruction;Gait training;Functional mobility training;Therapeutic activities;Therapeutic exercise;Balance training;Stair training;Patient/family education  AM-PAC PT "6 Clicks" Mobility Outcome Measure (Version 2)  Help needed turning from your back to your side while in a flat bed without using bedrails? 3  Help needed moving from lying on your back to sitting on the side of a flat bed without using bedrails? 3  Help needed moving to and from a bed to a chair (including a wheelchair)? 3  Help needed standing up from a chair using your arms (e.g., wheelchair or bedside chair)? 3  Help needed to walk in hospital room? 3  Help needed climbing 3-5 steps with a railing?  2  6 Click Score 17  Consider Recommendation of Discharge To: Home with [facility]  PT Recommendation  Follow Up Recommendations Supervision/Assistance - 24 hour (Resume outpatient PT when appropriate)  PT equipment None recommended by  PT  Individuals Consulted  Consulted and Agree with Results and Recommendations Family member/caregiver;Patient  Family Member Consulted son  Acute Rehab PT Goals  Patient Stated Goal to go home  PT Goal Formulation With patient  Time For Goal Achievement 05/31/21  Potential to Achieve Goals Good  PT Time Calculation  PT Start Time (ACUTE ONLY) 1157  PT Stop Time (ACUTE ONLY) 1227  PT Time Calculation (min) (ACUTE ONLY) 30 min  PT General Charges  $$ ACUTE PT VISIT 1 Visit  PT Evaluation  $PT Eval Moderate Complexity 1 Mod  Written Expression  Dominant Hand Right   Pt admitted secondary to problem above with deficits above. Pt with dyskinesia in R extremities> L extremities, but pt reports this is baseline secondary to Parkinson's Disease. Required min to min guard A for mobility tasks using RW. BP 112/76 in supine, 92/54 seated EOB and 128/98 in standing. Per son, looks as if pt is at baseline. Reports they have been following with outpatient PT prior to admission, but has been put on hold until pt has deep brain stimulator placed. Recommend continuing outpatient PT when appropriate. Will continue to follow acutely.   Reuel Derby, PT, DPT  Acute Rehabilitation Services  Pager: [phone] Office: [phone]

## 2021-05-17 NOTE — Progress Notes (Signed)
  Echocardiogram 2D Echocardiogram with 3D has been performed.  [name] M 05/17/2021, 2:04 PM

## 2021-05-18 NOTE — ED Provider Notes (Signed)
[facility]    CSN: [id number] Arrival date & time: 05/15/21  1508      History   Chief Complaint Chief Complaint  Patient presents with   Fall   Back Pain   Dizziness    HPI [name] is a 75 y.o. female.   Reports fall about an hour ago. States that she slipped on something in the kitchen. Reports right neck, shoulder pain and stiffness as well as right thoracic back pain and right lower rib pain. Reports syncopal episode yesterday but was lying supine. States that this is not unusual for her. She has hx Parkinson's disease, CAD, orthostatic hypotension, and more. Reports that she took home tramadol for her pain without relief. She has many uncontrolled upper body movements that are exacerbating her pain. States that these movements are typical of her Parkinson's. States that she has muscle relaxers at home that she can take as well. Denies head injury, LOC with fall, hearing popping or cracking when she fell, vision changes, altered mental state, nausea, vomiting, other symptoms.  ROS per HPI  The history is provided by the patient and a relative.  Fall  Back Pain Dizziness  Past Medical History:  Diagnosis Date   Allergy    ANEMIA-NOS 09/25/2007   Anxiety    Arthritis    B12 DEFICIENCY 05/03/2007   Blood transfusion without reported diagnosis    CAD (coronary artery disease) 01/2010   MI, Nishan   Cardiomyopathy ([place]) 02/08/2010   H/o this 2012 after urosepsis, no recurrence.    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   OSTEOPOROSIS 08/2009   bisphosphonate on hold 2/2 dysphagia, on reclast done in August each year   Parkinson's disease ([place]) 08/25/2015   Dx Dr [name] 07/2015    PONV (postoperative nausea and vomiting)    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   Takotsubo cardiomyopathy 2008   due to E coli urosepsis    Patient Active Problem List   Diagnosis Date Noted   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   Cough 12/20/2020   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   Left shoulder pain 06/19/2020   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   Degenerative lumbar spinal stenosis 11/27/2019   Knee pain, bilateral 08/06/2018   Constipation 08/06/2018   Right lumbar radiculopathy 04/10/2018   Dysphagia 10/09/2017   Chronic cough 07/16/2017   GAD (generalized anxiety disorder) 04/28/2016   Chronic insomnia 03/07/2016   Left Achilles tendinitis 12/08/2015   Parkinson's disease ([place]) 08/25/2015   Advanced care planning/counseling discussion 05/26/2015   Family circumstance 02/23/2015   Health maintenance examination 05/21/2014   MDD (major depressive disorder), single episode, moderate ([place]) 11/12/2013   DDD (degenerative disc disease), lumbar 05/31/2013   Medicare annual wellness visit, subsequent 05/20/2013   HLD (hyperlipidemia) 05/20/2013   Vitamin D deficiency 05/09/2013   Recurrent UTI 01/03/2011   Rash and  other nonspecific skin eruption 05/11/2010   GERD 02/22/2010   CAD (coronary artery disease) 01/25/2010   Osteoporosis 11/10/2009   Orthostatic hypotension 12/06/2008   Anemia 09/25/2007   Vitamin B12 deficiency 05/03/2007   Fibromyalgia 05/03/2007    Past Surgical History:  Procedure Laterality Date   ABDOMINAL HYSTERECTOMY  1970s   IUD infection - first partial then with oophorectomy (cysts), complication - low blood pressure   CATARACT EXTRACTION Bilateral     CHOLECYSTECTOMY     complication - low blood pressure   COLONOSCOPY  06/2008   h/o polyps but latest WNL, rec rpt 10 yrs Olevia Perches)   COLONOSCOPY  11/2018   multiple TAs (10 polyps total), rpt 2 yrs [facility])   CYSTOSCOPY  12/2013   abx treatment for recurrent cystitis   DEXA  04/2013   T -2.9 @ femur, -1.6 @ spine   DEXA  04/2017   T -2.9 hip, -0.7 spine   ESOPHAGOGASTRODUODENOSCOPY  12/2017   WNL, regardless esophagus dilated, small HH [facility])   LUMBAR LAMINECTOMY/DECOMPRESSION MICRODISCECTOMY Right 11/27/2019   Right Lumbar Four-Five foraminotomy;  Erline Levine, MD)   PTOSIS REPAIR Bilateral 10/2020   Plastic Surgery   UPPER GASTROINTESTINAL ENDOSCOPY      OB History   No obstetric history on file.      Home Medications    Prior to Admission medications   Medication Sig Start Date End Date Taking? Authorizing Provider  acetaminophen (TYLENOL) 650 MG CR tablet Take 2 tablets (1,300 mg total) by mouth every 8 (eight) hours as needed for pain. 05/17/21   Dwyane Dee, MD  AMBULATORY NON FORMULARY MEDICATION Lift chair Dx:  [name] 09/29/20   TatEustace Quail, DO  Calcium Carbonate-Vitamin D (CALCIUM 600/VITAMIN D) 600-400 MG-UNIT chew tablet Chew 1 tablet by mouth daily. 12/10/17   [name], MD  carbidopa-levodopa (SINEMET CR) 50-200 MG tablet TAKE 1 TABLET BY MOUTH AT  BEDTIME 05/17/21   [name], Eustace Quail, DO  carbidopa-levodopa (SINEMET IR) 25-100 MG tablet 2 tablet at 6am/1 tablet at 9:30am/1pm/4:30pm.  May take an extra 1/2 tablet prn evening 12/22/20   [name], Eustace Quail, DO  Cholecalciferol (VITAMIN D3) 25 MCG (1000 UT) CAPS Take 1 capsule (1,000 Units total) by mouth daily. 10/10/18   [name], MD  clonazePAM (KLONOPIN) 0.5 MG tablet TAKE 1 TABLET BY MOUTH  TWICE DAILY 05/11/21   [name], MD  cyanocobalamin (,VITAMIN B-12,) 1000 MCG/ML injection INJECT 1 ML (1,000 MCG TOTAL) INTO THE MUSCLE EVERY 30 (THIRTY) DAYS. 10/07/19   [name], MD  denosumab  (PROLIA) 60 MG/ML SOSY injection Inject 60 mg into the skin every 6 (six) months.    [provider]  latanoprost (XALATAN) 0.005 % ophthalmic solution Place 1 drop into both eyes at bedtime. 03/22/21   [provider]  midodrine (PROAMATINE) 5 MG tablet Take 1 tablet (5 mg total) by mouth 3 (three) times daily before meals. 05/17/21   Dwyane Dee, MD  polyethylene glycol (MIRALAX / Floria Raveling) packet Take 17 g by mouth as needed. 04/19/18   [name], MD  pramipexole (MIRAPEX) 0.5 MG tablet Take 1 tablet (0.5 mg total) by mouth 3 (three) times daily. 12/22/20   [name], Eustace Quail, DO  sertraline (ZOLOFT) 100 MG tablet TAKE 1 TABLET BY MOUTH  DAILY 05/04/21   [name], MD  traMADol (ULTRAM) 50 MG tablet Take 1 tablet (50 mg total) by mouth 3 (three) times daily as needed. 05/11/21   [name], MD  Family History Family History  Problem Relation Age of Onset   Heart attack Father    Diabetes Father    Prostate cancer Father    Esophageal cancer Mother    Lung cancer Brother    Breast cancer Sister    Ovarian cancer Sister    Lung cancer Brother    CAD Brother    Uterine cancer Sister    Clotting disorder Son    Healthy Son    Healthy Daughter    Healthy Daughter    Colon cancer Neg Hx    Rectal cancer Neg Hx    Stomach cancer Neg Hx     Social History Social History   Tobacco Use   Smoking status: Never   Smokeless tobacco: Never  Vaping Use   Vaping Use: Never used  Substance Use Topics   Alcohol use: No   Drug use: Not Currently    Types: Nitrous oxide     Allergies   Amitriptyline, Ciprofloxacin, Cymbalta [duloxetine hcl], Iohexol, Lyrica [pregabalin], Imipramine hcl, Iodine, Lidocaine hcl, Morphine sulfate, Neosporin [neomycin-bacitracin zn-polymyx], Sulfamethoxazole, and Tetracyclines & related   Review of Systems Review of Systems  Musculoskeletal:  Positive for back pain.  Neurological:  Positive for dizziness.     Physical Exam Triage Vital Signs ED Triage Vitals  Enc Vitals Group     BP 05/15/21 1537 (!) 86/60     Pulse Rate 05/15/21 1537 67     Resp 05/15/21 1537 20     Temp 05/15/21 1537 (!) 96.8 F (36 C)     Temp Source 05/15/21 1537 Tympanic     SpO2 05/15/21 1543 97 %     Weight 05/15/21 1531 148 lb (67.1 kg)     Height 05/15/21 1531 5\' 5"  (1.651 m)     Head Circumference --      Peak Flow --      Pain Score 05/15/21 1531 10     Pain Loc --      Pain Edu? --      Excl. in [place]? --    No data found.  Updated Vital Signs BP (!) 84/68 (BP Location: Left Arm)   Pulse 67   Temp (!) 96.8 F (36 C) (Tympanic)   Resp 20   Ht 5\' 5"  (1.651 m)   Wt 148 lb (67.1 kg)   SpO2 97%   BMI 24.63 kg/m    Physical Exam Vitals and nursing note reviewed.  Constitutional:      General: She is not in acute distress.    Appearance: Normal appearance. She is well-developed.  HENT:     Head: Normocephalic and atraumatic.     Nose: Nose normal.     Mouth/Throat:     Mouth: Mucous membranes are moist.     Pharynx: Oropharynx is clear.  Eyes:     Extraocular Movements: Extraocular movements intact.     Conjunctiva/sclera: Conjunctivae normal.     Pupils: Pupils are equal, round, and reactive to light.  Cardiovascular:     Rate and Rhythm: Normal rate and regular rhythm.     Heart sounds: Normal heart sounds.  Pulmonary:     Effort: Pulmonary effort is normal. No respiratory distress.     Breath sounds: No stridor. No wheezing, rhonchi or rales.  Chest:     Chest wall: No tenderness.  Musculoskeletal:        General: Tenderness and signs of injury present. Normal range of motion.     Cervical  back: Normal range of motion and neck supple.     Comments: Right thoracic back tender, no bruising/erythema/swelling noted  Lymphadenopathy:     Cervical: Cervical adenopathy (right posterior neck) present.  Skin:    General: Skin is warm and dry.     Capillary Refill: Capillary refill takes  less than 2 seconds.  Neurological:     General: No focal deficit present.     Mental Status: She is alert and oriented to person, place, and time.  Psychiatric:        Mood and Affect: Mood normal.        Behavior: Behavior normal.        Thought Content: Thought content normal.     UC Treatments / Results  Labs (all labs ordered are listed, but only abnormal results are displayed) Labs Reviewed - No data to display  EKG   Procedures Procedures (including critical care time)  Medications Ordered in UC Medications - No data to display  Initial Impression / Assessment and Plan / UC Course  I have reviewed the triage vital signs and the nursing notes.  Pertinent labs & imaging results that were available during my care of the patient were reviewed by me and considered in my medical decision making (see chart for details).    Fall, Initial Dizziness Right rib pain Right thoracic back pain Hypotension Parkinson's Disease  Discussed low BP in office today and states that her normal BP is 90s/50s Monitor BP at home, if it gets any lower, seek further evaluation in the ER May take home pain medication and muscle relaxers as prescribed May use ice to the area for the next 24 hours Then, may switch to heat May use muscle rubs Discussed possibility of fractured ribs, no SOB, low concern for pneumothorax Discussed imaging possibilities, patient unable to still her body for these Discussed that given low blood pressure, fall, and syncopal episodes that she would be best served in the ER Declines at this time, states that she will go if she has another syncopal episode or if her pain worsens States that she will call her PCP in the morning Stable at discharge   Final Clinical Impressions(s) / UC Diagnoses   Final diagnoses:  Fall, initial encounter  Dizziness  Rib pain on right side  Acute right-sided thoracic back pain  Hypotension, unspecified hypotension type   Parkinson's disease [facility])     Discharge Instructions      May take home pain medications and muscle relaxers as prescribed  Follow up in the ER for dizziness/lower BP  Follow up with the ER for acute worsening symptoms  Apply ice to the area for the next 24 hours, then you may use heat as needed     ED Prescriptions   None    PDMP not reviewed this encounter.   Faustino Congress, NP 05/18/21 1048

## 2021-05-18 NOTE — Discharge Instructions (Addendum)
May take home pain medications and muscle relaxers as prescribed  Follow up in the ER for dizziness/lower BP  Follow up with the ER for acute worsening symptoms  Apply ice to the area for the next 24 hours, then you may use heat as needed

## 2021-05-20 LAB — URINE CULTURE: Culture: >=100000 — AB

## 2021-05-23 DIAGNOSIS — H02841 Edema of right upper eyelid: Secondary | ICD-10-CM | POA: Diagnosis not present

## 2021-05-23 DIAGNOSIS — H02231 Paralytic lagophthalmos right upper eyelid: Secondary | ICD-10-CM | POA: Diagnosis not present

## 2021-05-23 DIAGNOSIS — H02811 Retained foreign body in right upper eyelid: Secondary | ICD-10-CM | POA: Diagnosis not present

## 2021-05-23 DIAGNOSIS — L905 Scar conditions and fibrosis of skin: Secondary | ICD-10-CM | POA: Diagnosis not present

## 2021-05-27 ENCOUNTER — Other Ambulatory Visit: Payer: Self-pay

## 2021-05-27 ENCOUNTER — Ambulatory Visit (INDEPENDENT_AMBULATORY_CARE_PROVIDER_SITE_OTHER): Payer: Medicare Other | Admitting: Family Medicine

## 2021-05-27 ENCOUNTER — Encounter: Payer: Self-pay | Admitting: Family Medicine

## 2021-05-27 VITALS — BP 104/60 | HR 82 | Temp 97.0°F | Ht 66.0 in | Wt 148.1 lb

## 2021-05-27 DIAGNOSIS — S2241XD Multiple fractures of ribs, right side, subsequent encounter for fracture with routine healing: Secondary | ICD-10-CM

## 2021-05-27 DIAGNOSIS — N390 Urinary tract infection, site not specified: Secondary | ICD-10-CM | POA: Diagnosis not present

## 2021-05-27 DIAGNOSIS — R21 Rash and other nonspecific skin eruption: Secondary | ICD-10-CM

## 2021-05-27 DIAGNOSIS — G20 Parkinson's disease: Secondary | ICD-10-CM | POA: Diagnosis not present

## 2021-05-27 DIAGNOSIS — I951 Orthostatic hypotension: Secondary | ICD-10-CM

## 2021-05-27 DIAGNOSIS — G249 Dystonia, unspecified: Secondary | ICD-10-CM | POA: Diagnosis not present

## 2021-05-27 MED ORDER — NYSTATIN-TRIAMCINOLONE 100000-0.1 UNIT/GM-% EX OINT: 1.0000 "application " | TOPICAL_OINTMENT | Freq: Two times a day (BID) | CUTANEOUS | 0 refills | Status: DC

## 2021-05-27 MED ORDER — DROXIDOPA 100 MG PO CAPS: 1.0000 | ORAL_CAPSULE | Freq: Three times a day (TID) | ORAL | 0 refills | Status: DC

## 2021-05-27 NOTE — Patient Instructions (Addendum)
Retry Northera instead of midodrine - take 100mg  three times a day. This will help elevate blood pressure to prevent fall/passing out.  For rash - try mycolog antifungal/steroid.  Return as needed or in 2-3 months for follow up visit.

## 2021-05-27 NOTE — Progress Notes (Signed)
Patient ID: [name], female    DOB: May 10, 1946, 75 y.o.   MRN: [id number]  This visit was conducted in person.  BP 104/60 (BP Location: Right Arm, Cuff Size: Normal)   Pulse 82   Temp (!) 97 F (36.1 C) (Temporal)   Ht 5\' 6"  (1.676 m)   Wt 148 lb 2 oz (67.2 kg)   SpO2 97%   BMI 23.91 kg/m    CC: hosp f/u visit  Subjective:   HPI: [name] is a 76 y.o. female presenting on 05/27/2021 for hospital followup (Fall/broken ribs) and Rash (L upper arm)   Recent hospitalization for syncope in setting of parkinson disease pending DBS placement next month. Suffered R sided rib fractures. Hospital records reviewed. Started on midodrine 5mg  TID - was unable to tolerate this due to headache. Incidentally noted UTI with culture growing >100k pansensitive E coli and >100k Aerococcus viridans treated with rocephin IV x2 doses during hospitalization. ?colonization as pt was asymptomatic.   New rash to left upper arm where bp cuff was. Not tender. Very itchy. Treating with benadryl cream, also taking oral benadryl.   Recent colonoscopy 05/12/2021 (rpt 3 yrs), recent eye surgery on Monday 6/25. Since then notes progressive uncontrollable movements.   Treated with abx for bladder infection. Never had UTI symptoms.  She continues mirapex and sinemet daily.     Admit date: 05/16/2021 Discharge date: 05/17/2021  TCM hosp f/u phone call not performed.  Admitted From: home Disposition:  home Discharging physician: Dwyane Dee, MD   Recommendations for Outpatient Follow-up:  Continue following with neurology Patient started on midodrine at discharge for orthostatic hypotension   Home Health: Equipment/Devices:   Patient discharged to home in Discharge Condition: stable CODE STATUS: Full     Relevant past medical, surgical, family and social history reviewed and updated as indicated. Interim medical history since our last visit reviewed. Allergies and medications reviewed and  updated. Outpatient Medications Prior to Visit  Medication Sig Dispense Refill   acetaminophen (TYLENOL) 650 MG CR tablet Take 2 tablets (1,300 mg total) by mouth every 8 (eight) hours as needed for pain.     AMBULATORY NON FORMULARY MEDICATION Lift chair Dx:  G20 1 Device 0   Calcium Carbonate-Vitamin D (CALCIUM 600/VITAMIN D) 600-400 MG-UNIT chew tablet Chew 1 tablet by mouth daily.     carbidopa-levodopa (SINEMET CR) 50-200 MG tablet TAKE 1 TABLET BY MOUTH AT  BEDTIME 90 tablet 0   carbidopa-levodopa (SINEMET IR) 25-100 MG tablet 2 tablet at 6am/1 tablet at 9:30am/1pm/4:30pm.  May take an extra 1/2 tablet prn evening 540 tablet 1   Cholecalciferol (VITAMIN D3) 25 MCG (1000 UT) CAPS Take 1 capsule (1,000 Units total) by mouth daily. 30 capsule    clonazePAM (KLONOPIN) 0.5 MG tablet TAKE 1 TABLET BY MOUTH  TWICE DAILY 60 tablet 0   cyanocobalamin (,VITAMIN B-12,) 1000 MCG/ML injection INJECT 1 ML (1,000 MCG TOTAL) INTO THE MUSCLE EVERY 30 (THIRTY) DAYS. 3 mL 2   denosumab (PROLIA) 60 MG/ML SOSY injection Inject 60 mg into the skin every 6 (six) months.     latanoprost (XALATAN) 0.005 % ophthalmic solution Place 1 drop into both eyes at bedtime.     polyethylene glycol (MIRALAX / GLYCOLAX) packet Take 17 g by mouth as needed. 30 each 1   pramipexole (MIRAPEX) 0.5 MG tablet Take 1 tablet (0.5 mg total) by mouth 3 (three) times daily. 270 tablet 1   sertraline (ZOLOFT) 100 MG tablet  TAKE 1 TABLET BY MOUTH  DAILY 90 tablet 0   traMADol (ULTRAM) 50 MG tablet Take 1 tablet (50 mg total) by mouth 3 (three) times daily as needed. 50 tablet 0   midodrine (PROAMATINE) 5 MG tablet Take 1 tablet (5 mg total) by mouth 3 (three) times daily before meals. 90 tablet 3   No facility-administered medications prior to visit.     Per HPI unless specifically indicated in ROS section below Review of Systems  Objective:  BP 104/60 (BP Location: Right Arm, Cuff Size: Normal)   Pulse 82   Temp (!) 97 F (36.1  C) (Temporal)   Ht 5\' 6"  (1.676 m)   Wt 148 lb 2 oz (67.2 kg)   SpO2 97%   BMI 23.91 kg/m   Wt Readings from Last 3 Encounters:  05/27/21 148 lb 2 oz (67.2 kg)  05/15/21 148 lb (67.1 kg)  05/12/21 151 lb (68.5 kg)      Physical Exam Vitals and nursing note reviewed.  Constitutional:      Appearance: Normal appearance.     Comments: Ambulates with walker  Cardiovascular:     Rate and Rhythm: Normal rate and regular rhythm.     Pulses: Normal pulses.     Heart sounds: Normal heart sounds. No murmur heard. Pulmonary:     Effort: Pulmonary effort is normal. No respiratory distress.     Breath sounds: Normal breath sounds. No wheezing, rhonchi or rales.  Skin:    General: Skin is warm and dry.     Findings: Rash present.     Comments: Pruritic red maculopapular rash to left upper arm proximal to elbow  Neurological:     Mental Status: She is alert.     Cranial Nerves: No dysarthria.     Comments:  Prominent dyskinesia of face, arms, trunk.  Lip smacking and chewing motion present.        Assessment & Plan:  This visit occurred during the SARS-CoV-2 public health emergency.  Safety protocols were in place, including screening questions prior to the visit, additional usage of staff PPE, and extensive cleaning of exam room while observing appropriate contact time as indicated for disinfecting solutions.   Problem List Items Addressed This Visit     Parkinson's disease ([place]) - Primary (Chronic)    Continues sinemet regularly.  Upcoming DBS implantation planned next month  Continue regular walker use.        Orthostatic hypotension    Marked orthostatic hypotension, leading to syncope/falls.  Was unable to tolerate midodrine due to HAs.  Will restart northera (previously tolerated well) 100mg  TID, titrate to effect.        Relevant Medications   Droxidopa (NORTHERA) 100 MG CAPS   Rash and other nonspecific skin eruption    Itchy erythematous maculopapular rash that  started at site of BP cuff during recent hospitalization. ?candidal component - will treat with mycolog, update with effect.   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 Remains asxs. ?colonization.        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  She continues parkinson treatment (sinemet, mirapex) which presumably is causing peak-dose dyskinesia  and she does have DBS interventions planned next month. Unable to tolerate amantadine due to hallucinations.        Closed fracture of rib of right side with routine healing    Continue tylenol/tramadol for pain control.          Meds ordered this encounter  Medications   DISCONTD: Droxidopa (NORTHERA) 100 MG CAPS    Sig: Take 1 capsule (100 mg total) by mouth in the morning, at noon, and at bedtime.    Dispense:  90 capsule    Refill:  0   Droxidopa (NORTHERA) 100 MG CAPS    Sig: Take 1 capsule (100 mg total) by mouth in the morning, at noon, and at bedtime.    Dispense:  90 capsule    Refill:  0    To replace midodrine   nystatin-triamcinolone ointment (MYCOLOG)    Sig: Apply 1 application topically 2 (two) times daily. Limit to 10 days use    Dispense:  30 g    Refill:  0    No orders of the defined types were placed in this encounter.   Patient Instructions  Retry Northera instead of midodrine - take 100mg  three times a day. This will help elevate blood pressure to prevent fall/passing out.  For rash - try mycolog antifungal/steroid.  Return as needed or in 2-3 months for follow up visit.   Follow up plan: Return in about 2 months (around 07/28/2021), or if symptoms worsen or fail to  improve.  [name], MD

## 2021-05-28 DIAGNOSIS — S2231XD Fracture of one rib, right side, subsequent encounter for fracture with routine healing: Secondary | ICD-10-CM | POA: Insufficient documentation

## 2021-05-28 DIAGNOSIS — G20 Parkinson's disease: Secondary | ICD-10-CM | POA: Insufficient documentation

## 2021-05-28 DIAGNOSIS — G20B1 Parkinson's disease with dyskinesia, without mention of fluctuations: Secondary | ICD-10-CM | POA: Insufficient documentation

## 2021-05-28 HISTORY — DX: Fracture of one rib, right side, subsequent encounter for fracture with routine healing: S22.31XD

## 2021-05-28 NOTE — Assessment & Plan Note (Signed)
Continue tylenol/tramadol for pain control.

## 2021-05-28 NOTE — Assessment & Plan Note (Signed)
Itchy erythematous maculopapular rash that started at site of BP cuff during recent hospitalization. ?candidal component - will treat with mycolog, update with effect.   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

## 2021-05-28 NOTE — Assessment & Plan Note (Addendum)
Continues sinemet regularly.  Upcoming DBS implantation planned next month  Continue regular walker use.

## 2021-05-28 NOTE — Assessment & Plan Note (Addendum)
Marked orthostatic hypotension, leading to syncope/falls.  Was unable to tolerate midodrine due to HAs.  Will restart northera (previously tolerated well) 100mg  TID, titrate to effect.

## 2021-05-28 NOTE — Assessment & Plan Note (Addendum)
Choreiform movement on exam today new to me however pt states she regularly experiences this, possible worsening for several weeks now since recent colonoscopy and eye surgery.  She continues parkinson treatment (sinemet, mirapex) which presumably is causing peak-dose dyskinesia and she does have DBS interventions planned next month. Unable to tolerate amantadine due to hallucinations.

## 2021-05-28 NOTE — Assessment & Plan Note (Signed)
Rpt UTI treated in hospital with rocephin course. Remains asxs. ?colonization.

## 2021-05-30 ENCOUNTER — Other Ambulatory Visit: Payer: Self-pay | Admitting: Neurology

## 2021-05-31 ENCOUNTER — Ambulatory Visit: Payer: Medicare Other

## 2021-06-01 ENCOUNTER — Ambulatory Visit (INDEPENDENT_AMBULATORY_CARE_PROVIDER_SITE_OTHER): Payer: Medicare Other

## 2021-06-01 ENCOUNTER — Other Ambulatory Visit: Payer: Self-pay

## 2021-06-01 DIAGNOSIS — E538 Deficiency of other specified B group vitamins: Secondary | ICD-10-CM | POA: Diagnosis not present

## 2021-06-01 MED ORDER — CYANOCOBALAMIN 1000 MCG/ML IJ SOLN
1000.0000 ug | Freq: Once | INTRAMUSCULAR | Status: AC
  Administered 2021-06-01: 1000 ug via INTRAMUSCULAR

## 2021-06-01 NOTE — Progress Notes (Signed)
Per orders of Dr. Danise Mina, injection of B12 given by Loreen Freud. Patient tolerated injection well.

## 2021-06-02 ENCOUNTER — Telehealth: Payer: Self-pay

## 2021-06-02 DIAGNOSIS — I951 Orthostatic hypotension: Secondary | ICD-10-CM

## 2021-06-02 NOTE — Telephone Encounter (Signed)
Received faxed PA form from [facility] for nystatin-triamcinolone oint.  Placed form in Dr. Synthia Innocent box.

## 2021-06-02 NOTE — Telephone Encounter (Signed)
Received faxed PA form from [facility] for droxidopa 100 mg cap.  Placed form in Dr. Synthia Innocent box.

## 2021-06-03 NOTE — Telephone Encounter (Signed)
Filled and in my out box.  For northera as well as mycolog.

## 2021-06-06 ENCOUNTER — Other Ambulatory Visit: Payer: Self-pay | Admitting: Family Medicine

## 2021-06-06 ENCOUNTER — Telehealth: Payer: Self-pay | Admitting: *Deleted

## 2021-06-06 NOTE — Telephone Encounter (Signed)
Last office visit 05/27/2021 for hospital follow up.  Last refilled 05/11/2021 for #60 with no refills.  Next Appt: 07/29/21 for follow up.

## 2021-06-06 NOTE — Telephone Encounter (Signed)
Last OV - [id number] Next OV - [id number] Last Filled - [id number]

## 2021-06-06 NOTE — Telephone Encounter (Signed)
Forms were faxed to [facility]. 06/06/2021 @ 4:10 pm.

## 2021-06-06 NOTE — Telephone Encounter (Signed)
Message was left on voicemail by a representative from [employer] stating that she was calling about PA  on Droxidopa. Representative did not leave her name. Patient stated that you can call the insurance company back at [phone] ID # [id number].

## 2021-06-06 NOTE — Telephone Encounter (Signed)
Patient left a voicemail stating that the medication prescribed to her for her blood pressure is too expensive. Patient wanted to know if something different could be prescribed that would cost less. Tried to call patient back to verify which medication she is calling about. Left a message for patient to call the office back.

## 2021-06-06 NOTE — Telephone Encounter (Signed)
Patient called back stating that the medication was prescribed when she was last in for a visit. Patient stated that she thinks that is started with a N. Looks like Droxidopa was last prescribed. Patient was advised that it looks like a PA is being processed for this medication. Patient was advised that this message will go back to Dr. Bosie Clos CMA regarding this. [name]

## 2021-06-06 NOTE — Telephone Encounter (Signed)
[facility] insurance was called and they just wanted to inform us that the PA was in processing now. No further action needed.

## 2021-06-07 MED ORDER — CLOTRIMAZOLE-BETAMETHASONE 1-0.05 % EX CREA
1.0000 | TOPICAL_CREAM | Freq: Every day | CUTANEOUS | 0 refills | Status: DC
Start: 2021-06-07 — End: 2024-07-29

## 2021-06-07 NOTE — Addendum Note (Signed)
Addended by: [name] on: 06/07/2021 05:53 PM   Modules accepted: Orders

## 2021-06-07 NOTE — Telephone Encounter (Signed)
Received form that insurance has preferred alternatives to mycolog - clotrimazole-betamethasone. Will prescribe this instead, but have pt only use once daily for 1 wk then stop.

## 2021-06-07 NOTE — Telephone Encounter (Signed)
ERx 

## 2021-06-07 NOTE — Telephone Encounter (Signed)
Request for more information filled and in Lisa's box.

## 2021-06-07 NOTE — Telephone Encounter (Signed)
Patient called and informed of her PA's being faxed this morning and she should be hearing from the pharmacy. Patient stated she understood and will await their call but if they don't call she was going to call them and find out where it is.

## 2021-06-08 NOTE — Telephone Encounter (Signed)
Patient was called and informed about the PA paperwork and when it was faxed, waiting on call from patient to confirm receiving her medications.

## 2021-06-08 NOTE — Telephone Encounter (Signed)
PA approved for Droxidopa 100 mg TID through 11/26/2021. Patient advised. Patient will check with the pharmacy on the cost and let us know if she can not afford this.

## 2021-06-08 NOTE — Telephone Encounter (Signed)
Patient was called and made aware of her medications. Patient stated understanding.

## 2021-06-13 NOTE — Telephone Encounter (Signed)
Received faxed message from [facility] stating PA approval previously (PA-A [id number]), valid 06/06/2021- 11/26/2021.

## 2021-06-15 ENCOUNTER — Telehealth: Payer: Self-pay

## 2021-06-15 MED ORDER — FLUDROCORTISONE ACETATE 0.1 MG PO TABS: 0.1000 mg | ORAL_TABLET | Freq: Every day | ORAL | 3 refills | Status: DC

## 2021-06-15 NOTE — Addendum Note (Signed)
Addended by: [name] on: 06/15/2021 10:20 AM   Modules accepted: Orders

## 2021-06-15 NOTE — Telephone Encounter (Signed)
Error starting a new note; attached to 06/02/21

## 2021-06-15 NOTE — Telephone Encounter (Signed)
Spoke with pt relaying Dr. Synthia Innocent message.  She verbalizes understanding.  Pt wants to make Dr. Darnell Level aware that she passed out 2x last week (7/11, 7/12) at church.

## 2021-06-15 NOTE — Telephone Encounter (Signed)
Would encourage compression stocking use as well as good fluid hydration, consider liberalizing salt to diet.  I will send in fludrocortisone for pt to price out in place of droxidopa.

## 2021-06-15 NOTE — Telephone Encounter (Signed)
Pt said that Droxidopa with PA is still over $100.00 out of pocket for pt and she cannot afford that. Pt wants to know if there is a different med pt can take for hypotension. Sending note to Dr Darnell Level and Lattie Haw CMA;pt request cb after reviewed by DR G[facility].

## 2021-06-16 ENCOUNTER — Other Ambulatory Visit: Payer: Self-pay | Admitting: Neurology

## 2021-06-16 NOTE — Telephone Encounter (Signed)
Spoke with pt relaying Dr. Synthia Innocent message.  Pt verbalizes understanding and says she picked up med today.

## 2021-06-16 NOTE — Telephone Encounter (Signed)
She needs a medication to treat her orthostatic hypotension. Hopefully fludrocortisone will be affordable/tolerable.

## 2021-06-17 ENCOUNTER — Telehealth: Payer: Self-pay

## 2021-06-17 NOTE — Telephone Encounter (Signed)
Noted  

## 2021-06-17 NOTE — Telephone Encounter (Signed)
Angelica with [employer] clinical appeals left v/m to call [facility] for appeals for a medication with case # [id number] and fax # is [phone]. I called [employer]  and got Angleica Eugenio Hoes v/m and left v/m for Angelica to cb with name of med for appeal sending note to [facility].

## 2021-06-22 NOTE — Telephone Encounter (Signed)
I called Angelica again and left a message. Per chart scanned notes it looks like medication was changed to another alternative per insurance company. Left message stating that if anything else is needed to call us back, I am closing this note at this time.

## 2021-06-25 ENCOUNTER — Other Ambulatory Visit: Payer: Self-pay | Admitting: Neurology

## 2021-06-25 ENCOUNTER — Telehealth: Payer: Self-pay

## 2021-06-25 NOTE — Telephone Encounter (Signed)
Prolia VOB initiated via [email or web address]  Last OV: 05/27/21 Next OV: 07/29/21 Last Prolia inj: 02/22/21 Next Prolia inj DUE: 08/26/21

## 2021-06-30 ENCOUNTER — Ambulatory Visit (HOSPITAL_COMMUNITY)
Admission: RE | Admit: 2021-06-30 | Discharge: 2021-06-30 | Disposition: A | Discharge status: Home / Self Care | Payer: Medicare Other | Attending: Neurosurgery | Admitting: Neurosurgery

## 2021-06-30 ENCOUNTER — Encounter (HOSPITAL_COMMUNITY)
Admission: RE | Disposition: A | Discharge status: Home / Self Care | Payer: Self-pay | Source: Home / Self Care | Attending: Neurosurgery

## 2021-06-30 ENCOUNTER — Telehealth: Payer: Self-pay | Admitting: Neurology

## 2021-06-30 ENCOUNTER — Ambulatory Visit (HOSPITAL_COMMUNITY)
Admission: RE | Admit: 2021-06-30 | Discharge: 2021-06-30 | Disposition: A | Discharge status: Home / Self Care | Payer: Medicare Other | Source: Ambulatory Visit | Attending: Neurosurgery | Admitting: Neurosurgery

## 2021-06-30 DIAGNOSIS — G20 Parkinson's disease: Secondary | ICD-10-CM

## 2021-06-30 DIAGNOSIS — Z79899 Other long term (current) drug therapy: Secondary | ICD-10-CM | POA: Diagnosis not present

## 2021-06-30 DIAGNOSIS — Z885 Allergy status to narcotic agent status: Secondary | ICD-10-CM | POA: Insufficient documentation

## 2021-06-30 DIAGNOSIS — Z91041 Radiographic dye allergy status: Secondary | ICD-10-CM | POA: Diagnosis not present

## 2021-06-30 DIAGNOSIS — Z882 Allergy status to sulfonamides status: Secondary | ICD-10-CM | POA: Insufficient documentation

## 2021-06-30 DIAGNOSIS — Z881 Allergy status to other antibiotic agents status: Secondary | ICD-10-CM | POA: Diagnosis not present

## 2021-06-30 DIAGNOSIS — Z888 Allergy status to other drugs, medicaments and biological substances status: Secondary | ICD-10-CM | POA: Diagnosis not present

## 2021-06-30 DIAGNOSIS — Z79891 Long term (current) use of opiate analgesic: Secondary | ICD-10-CM | POA: Insufficient documentation

## 2021-06-30 DIAGNOSIS — Z01818 Encounter for other preprocedural examination: Secondary | ICD-10-CM | POA: Diagnosis not present

## 2021-06-30 DIAGNOSIS — I672 Cerebral atherosclerosis: Secondary | ICD-10-CM | POA: Diagnosis not present

## 2021-06-30 HISTORY — PX: MINOR PLACEMENT OF FIDUCIAL: SHX6748

## 2021-06-30 SURGERY — MINOR PLACEMENT OF FIDUCIAL
Anesthesia: LOCAL

## 2021-06-30 MED ORDER — LIDOCAINE-EPINEPHRINE 1 %-1:100000 IJ SOLN
INTRAMUSCULAR | Status: AC
  Filled 2021-06-30: qty 1

## 2021-06-30 MED ORDER — BACITRACIN ZINC 500 UNIT/GM EX OINT
TOPICAL_OINTMENT | CUTANEOUS | Status: AC
  Filled 2021-06-30: qty 28.35

## 2021-06-30 SURGICAL SUPPLY — 19 items
BAG ATCL THK3 35X25 (MISCELLANEOUS) ×1 IMPLANT
BAG BIOHAZARD 25X35 (MISCELLANEOUS) ×2
BAG COUNTER SPONGE SURGICOUNT (BAG) ×2 IMPLANT
BLADE CLIPPER SPEC (BLADE) ×2 IMPLANT
BLADE SURG 11 STRL SS (BLADE) ×2 IMPLANT
BNDG ADH 1X3 SHEER STRL LF (GAUZE/BANDAGES/DRESSINGS) ×10 IMPLANT
COVER BACK TABLE 60X90IN (DRAPES) ×2 IMPLANT
DRAPE HALF SHEET 40X57 (DRAPES) ×2 IMPLANT
DRAPE SHEET LG 3/4 BI-LAMINATE (DRAPES) ×2 IMPLANT
GAUZE SPONGE 4X4 12PLY STRL (GAUZE/BANDAGES/DRESSINGS) ×6 IMPLANT
GLOVE SURG ENC MOIS LTX SZ8 (GLOVE) ×4 IMPLANT
GLOVE SURG LTX SZ8.5 (GLOVE) ×2 IMPLANT
NEEDLE HYPO 18GX1.5 BLUNT FILL (NEEDLE) ×2 IMPLANT
NEEDLE HYPO 25X1 1.5 SAFETY (NEEDLE) ×2 IMPLANT
SOL PREP POV-IOD 4OZ 10% (MISCELLANEOUS) ×2 IMPLANT
STAPLER SKIN PROX WIDE 3.9 (STAPLE) ×2 IMPLANT
SUT ETHILON 3 0 PS 1 (SUTURE) ×10 IMPLANT
SYR CONTROL 10ML LL (SYRINGE) ×2 IMPLANT
TOWEL GREEN STERILE (TOWEL DISPOSABLE) ×2 IMPLANT

## 2021-06-30 NOTE — Telephone Encounter (Signed)
Dbs planning attempted today.  Started and difficult to identify midline on planning software due to what looks like midline shift.  Appears to be something in R lat ventricle producing shift, seen on T2 images.  Called radiologist and was told it was just flow void.  Questioned it since the ventricle also looked larger on that side and looked like it had shift and he said it was flow void (and was different radiologist than read images, who was now on vacation, and also called it normal).  Images sent to Dr. Vertell Limber for review.

## 2021-06-30 NOTE — Brief Op Note (Signed)
06/30/2021  10:22 AM  PATIENT:  [name]  75 y.o. female  PRE-OPERATIVE DIAGNOSIS:   Parkinson's Disease  POST-OPERATIVE DIAGNOSIS:  Parkinson's Disease  PROCEDURE:  Procedure(s) with comments: Placement of fiducials  SURGEON:  Surgeon(s) and Role:    Erline Levine, MD - Primary  PHYSICIAN ASSISTANT:   ASSISTANTS: Poteat, RN   ANESTHESIA:   local  EBL:  None  BLOOD ADMINISTERED:none  DRAINS: none   LOCAL MEDICATIONS USED:  LIDOCAINE   SPECIMEN:  No Specimen  DISPOSITION OF SPECIMEN:  N/A  COUNTS:  YES  TOURNIQUET:  * No tourniquets in log *  DICTATION: Indications:  Patient has Parkinson's Disease and presents for Star Fix Fiducial Placement for upcoming DBS STN placement.    Procedure:  Patient was brought to the short stay room.  Her scalp was shaved.  Areas of planned fiducial placement were marked, scalp was prepped with betadine.  Scalp was infiltrated with lidocaine with epinephrine.  Four fiducials were placed according to standard landmarks through stab incisions.  3-0 Nylon sutures were placed and sterile dressings were applied.  Patient tolerated procedure well.  She was taken for a head CT and then sent home.   PLAN OF CARE:  Home after CT  PATIENT DISPOSITION:  Short Stay   Delay start of Pharmacological VTE agent (>24hrs) due to surgical blood loss or risk of bleeding: yes

## 2021-06-30 NOTE — H&P (View-Only) (Signed)
Patient ID:   [id number] Patient: [name]  Date of Birth: 10-07-1946 Visit Type: Office Visit   Date: 03/30/2021 11:15 AM Provider: Marchia Meiers. Vertell Limber MD   This 75 year old female presents for Prior to Surgery.  HISTORY OF PRESENT ILLNESS: 1.  Prior to Surgery  Patient returns to discuss DBS for Parkinson's  I met with the patient and her son and we went over her Parkinson's disease status as well as the exact nature of the deep brain stimulator electrode placement surgery.  I answered all their questions and spent 45 minutes in direct consultation with the patient and her son.  I showed the models of the devices and answered specific questions about the nature of the surgery.  The plan is to place fiducials on 06/30/2021.  She will have the deep brain stimulation surgery on 07/07/2021 and she will undergo IPG placement on 07/14/2021.      Medical/Surgical/Interim History Reviewed, no change.  Last detailed document date:10/20/2019.     PAST MEDICAL HISTORY, SURGICAL HISTORY, FAMILY HISTORY, SOCIAL HISTORY AND REVIEW OF SYSTEMS I have reviewed the patient's past medical, surgical, family and social history as well as the comprehensive review of systems as included on the [facility] history form dated 03/30/2021, which I have signed.  Family History: Reviewed, no changes.  Last detailed document date:10/20/2019.   Social History: Reviewed, no changes. Last detailed document date: 10/20/2019.    MEDICATIONS: (added, continued or stopped this visit) Started Medication Directions Instruction Stopped  alfuzosin ER 10 mg tablet,extended release 24 hr     B-12 Compliance 1,000 mcg/mL injection kit     CALCIUM PETITES     carbidopa ER 25 mg-levodopa 100 mg tablet,extended release     carbidopa ER 50 mg-levodopa 200 mg tablet,extended release     clonazepam 0.5 mg tablet     Dry Eye Relief     Easy Touch 1 mL  25 gauge x 5/8" syringe     gabapentin 100 mg capsule     latanoprost 0.005 % eye drops    11/28/2019 methocarbamol 500 mg tablet take 1 tablet by oral route 4 times every day as needed for muscle spasms    Monoject Syringe 3 mL 25 x 1 1/4"     Northera 100 mg capsule     Northera 300 mg capsule     polyethylene glycol 3350 17 gram oral powder packet     pramipexole 1 mg tablet     sertraline 100 mg tablet     tramadol 50 mg tablet     triamcinolone acetonide 0.1 % topical cream     VITAMIN C     Vitamin D3  ORAL       ALLERGIES: Ingredient Reaction Medication Name Comment IMIPRAMINE    CIPROFLOXACIN    BACITRACIN  Neosporin (neo-bac-polym)  LIDOCAINE    TETRACYCLINE    BACITRACIN ZINC  Neosporin (neo-bac-polym)  PREGABALIN  Lyrica  SULFA (SULFONAMIDE ANTIBIOTICS)    POLYMYXIN B  Neosporin (neo-bac-polym)  MORPHINE SULFATE    DULOXETINE HCL  Cymbalta  NEOMYCIN SULFATE  Neosporin (neo-bac-polym)  AMITRIPTYLINE    IOHEXOL     Reviewed, no changes.    PHYSICAL EXAM:  Vitals Date Temp F BP Pulse Ht In Wt Lb BMI BSA Pain Score 03/30/2021  106/61 75 60.5 150.2 28.85  7/10     IMPRESSION:  Parkinson's with significant dyskinesias with significant on and off.  PLAN: Proceed with deep brain stimulation  for Parkinson's with bilateral STN DBS.  Risks and benefits of surgery were discussed in detail with the patient and she wishes to proceed with surgery.  Orders: Instruction(s)/Education: Assessment Instruction [phone] Dietary management education, guidance, and counseling  Completed Orders (this encounter) Order Details Reason Side Interpretation Result Initial Treatment Date Region Dietary management education, guidance, and counseling Encouraged patient to eat well balanced diet.        Assessment/Plan  # Detail  Type Description  1. Assessment Parkinson disease (G20).     2. Assessment Body mass index (BMI) 28.0-28.9, adult (Z55.62).  Plan Orders Today's instructions / counseling include(s) Dietary management education, guidance, and counseling. Clinical information/comments: Encouraged patient to eat well balanced diet.       Pain Management Plan Pain Scale: 7/10. Method: Numeric Pain Intensity Scale. Location: back. Onset: 10/20/2019. Duration: varies. Quality: discomforting. Pain management follow-up plan of care: Patient will continue medication management..              Provider:  Marchia Meiers. Vertell Limber MD  04/01/2021 06:13 PM    Dictation edited by: Marchia Meiers. Vertell Limber    CC Providers: [place],  [place]  [id number]-   Rebecca [name]  [address] [place],  [address]               Electronically signed by Marchia Meiers. Vertell Limber MD on 04/01/2021 06:13 PM

## 2021-06-30 NOTE — Op Note (Signed)
06/30/2021  10:22 AM  PATIENT:  [name]  75 y.o. female  PRE-OPERATIVE DIAGNOSIS:   Parkinson's Disease  POST-OPERATIVE DIAGNOSIS:  Parkinson's Disease  PROCEDURE:  Procedure(s) with comments: Placement of fiducials  SURGEON:  Surgeon(s) and Role:    Erline Levine, MD - Primary  PHYSICIAN ASSISTANT:   ASSISTANTS: Poteat, RN   ANESTHESIA:   local  EBL:  None  BLOOD ADMINISTERED:none  DRAINS: none   LOCAL MEDICATIONS USED:  LIDOCAINE   SPECIMEN:  No Specimen  DISPOSITION OF SPECIMEN:  N/A  COUNTS:  YES  TOURNIQUET:  * No tourniquets in log *  DICTATION: Indications:  Patient has Parkinson's Disease and presents for Star Fix Fiducial Placement for upcoming DBS STN placement.    Procedure:  Patient was brought to the short stay room.  Her scalp was shaved.  Areas of planned fiducial placement were marked, scalp was prepped with betadine.  Scalp was infiltrated with lidocaine with epinephrine.  Four fiducials were placed according to standard landmarks through stab incisions.  3-0 Nylon sutures were placed and sterile dressings were applied.  Patient tolerated procedure well.  She was taken for a head CT and then sent home.   PLAN OF CARE: Home after CT  PATIENT DISPOSITION:  Short Stay   Delay start of Pharmacological VTE agent (>24hrs) due to surgical blood loss or risk of bleeding: yes

## 2021-06-30 NOTE — Interval H&P Note (Signed)
History and Physical Interval Note:  06/30/2021 8:59 AM  [name]  has presented today for surgery, with the diagnosis of Parkinsons.  The various methods of treatment have been discussed with the patient and family. After consideration of risks, benefits and other options for treatment, the patient has consented to  Procedure(s) with comments: MINOR PLACEMENT OF FIDUCIAL (N/A) - Minor room as a surgical intervention.  The patient's history has been reviewed, patient examined, no change in status, stable for surgery.  I have reviewed the patient's chart and labs.  Questions were answered to the patient's satisfaction.     Peggyann Shoals

## 2021-06-30 NOTE — H&P (Signed)
Patient ID:   [id number] Patient: [name]  Date of Birth: [birth year] Visit Type: Office Visit   Date: 03/30/2021 11:15 AM Provider: Marchia Meiers. Vertell Limber MD   This 75 year old female presents for Prior to Surgery.  HISTORY OF PRESENT ILLNESS: 1.  Prior to Surgery  Patient returns to discuss DBS for Parkinson's  I met with the patient and her son and we went over her Parkinson's disease status as well as the exact nature of the deep brain stimulator electrode placement surgery.  I answered all their questions and spent 45 minutes in direct consultation with the patient and her son.  I showed the models of the devices and answered specific questions about the nature of the surgery.  The plan is to place fiducials on 06/30/2021.  She will have the deep brain stimulation surgery on 07/07/2021 and she will undergo IPG placement on 07/14/2021.      Medical/Surgical/Interim History Reviewed, no change.  Last detailed document date:10/20/2019.     PAST MEDICAL HISTORY, SURGICAL HISTORY, FAMILY HISTORY, SOCIAL HISTORY AND REVIEW OF SYSTEMS I have reviewed the patient's past medical, surgical, family and social history as well as the comprehensive review of systems as included on the [facility] history form dated 03/30/2021, which I have signed.  Family History: Reviewed, no changes.  Last detailed document date:10/20/2019.   Social History: Reviewed, no changes. Last detailed document date: 10/20/2019.    MEDICATIONS: (added, continued or stopped this visit) Started Medication Directions Instruction Stopped  alfuzosin ER 10 mg tablet,extended release 24 hr     B-12 Compliance 1,000 mcg/mL injection kit     CALCIUM PETITES     carbidopa ER 25 mg-levodopa 100 mg tablet,extended release     carbidopa ER 50 mg-levodopa 200 mg tablet,extended release     clonazepam 0.5 mg tablet     Dry Eye Relief     Easy Touch 1 mL  25 gauge x 5/8" syringe     gabapentin 100 mg capsule     latanoprost 0.005 % eye drops    11/28/2019 methocarbamol 500 mg tablet take 1 tablet by oral route 4 times every day as needed for muscle spasms    Monoject Syringe 3 mL 25 x 1 1/4"     Northera 100 mg capsule     Northera 300 mg capsule     polyethylene glycol 3350 17 gram oral powder packet     pramipexole 1 mg tablet     sertraline 100 mg tablet     tramadol 50 mg tablet     triamcinolone acetonide 0.1 % topical cream     VITAMIN C     Vitamin D3  ORAL       ALLERGIES: Ingredient Reaction Medication Name Comment IMIPRAMINE    CIPROFLOXACIN    BACITRACIN  Neosporin (neo-bac-polym)  LIDOCAINE    TETRACYCLINE    BACITRACIN ZINC  Neosporin (neo-bac-polym)  PREGABALIN  Lyrica  SULFA (SULFONAMIDE ANTIBIOTICS)    POLYMYXIN B  Neosporin (neo-bac-polym)  MORPHINE SULFATE    DULOXETINE HCL  Cymbalta  NEOMYCIN SULFATE  Neosporin (neo-bac-polym)  AMITRIPTYLINE    IOHEXOL     Reviewed, no changes.    PHYSICAL EXAM:  Vitals Date Temp F BP Pulse Ht In Wt Lb BMI BSA Pain Score 03/30/2021  106/61 75 60.5 150.2 28.85  7/10     IMPRESSION:  Parkinson's with significant dyskinesias with significant on and off.  PLAN: Proceed with deep brain stimulation  for Parkinson's with bilateral STN DBS.  Risks and benefits of surgery were discussed in detail with the patient and she wishes to proceed with surgery.  Orders: Instruction(s)/Education: Assessment Instruction [phone] Dietary management education, guidance, and counseling  Completed Orders (this encounter) Order Details Reason Side Interpretation Result Initial Treatment Date Region Dietary management education, guidance, and counseling Encouraged patient to eat well balanced diet.        Assessment/Plan  # Detail  Type Description  1. Assessment Parkinson disease (G20).     2. Assessment Body mass index (BMI) 28.0-28.9, adult (V20.23).  Plan Orders Today's instructions / counseling include(s) Dietary management education, guidance, and counseling. Clinical information/comments: Encouraged patient to eat well balanced diet.       Pain Management Plan Pain Scale: 7/10. Method: Numeric Pain Intensity Scale. Location: back. Onset: 10/20/2019. Duration: varies. Quality: discomforting. Pain management follow-up plan of care: Patient will continue medication management..              Provider:  Marchia Meiers. Vertell Limber MD  04/01/2021 06:13 PM    Dictation edited by: Marchia Meiers. Vertell Limber    CC Providers: [place],  [place]  [id number]   Rebecca [name]  [address] [place], [place] [address]               Electronically signed by Marchia Meiers. Vertell Limber MD on 04/01/2021 06:13 PM

## 2021-07-01 ENCOUNTER — Encounter (HOSPITAL_COMMUNITY): Payer: Self-pay

## 2021-07-01 ENCOUNTER — Encounter (HOSPITAL_COMMUNITY): Payer: Self-pay | Admitting: Neurosurgery

## 2021-07-01 ENCOUNTER — Ambulatory Visit (HOSPITAL_COMMUNITY): Payer: Medicare Other

## 2021-07-05 ENCOUNTER — Other Ambulatory Visit: Payer: Self-pay | Admitting: Family Medicine

## 2021-07-06 ENCOUNTER — Other Ambulatory Visit: Payer: Self-pay

## 2021-07-06 ENCOUNTER — Encounter (HOSPITAL_COMMUNITY): Payer: Self-pay | Admitting: Neurosurgery

## 2021-07-06 ENCOUNTER — Ambulatory Visit: Payer: Medicare Other

## 2021-07-06 NOTE — Anesthesia Preprocedure Evaluation (Addendum)
Anesthesia Evaluation  Patient identified by MRN, date of birth, ID band Patient awake    Reviewed: Allergy & Precautions, H&P , NPO status , Patient's Chart, lab work & pertinent test results  History of Anesthesia Complications (+) PONV  Airway Mallampati: II  TM Distance: >3 FB Neck ROM: Full    Dental no notable dental hx. (+) Edentulous Upper, Edentulous Lower, Dental Advisory Given   Pulmonary neg pulmonary ROS,    Pulmonary exam normal breath sounds clear to auscultation       Cardiovascular Exercise Tolerance: Good + CAD and +CHF   Rhythm:Regular Rate:Normal     Neuro/Psych Anxiety Depression negative neurological ROS     GI/Hepatic Neg liver ROS, GERD  ,  Endo/Other  negative endocrine ROS  Renal/GU negative Renal ROS  negative genitourinary   Musculoskeletal  (+) Arthritis , Osteoarthritis,  Fibromyalgia -  Abdominal   Peds  Hematology  (+) Blood dyscrasia, anemia ,   Anesthesia Other Findings   Reproductive/Obstetrics negative OB ROS                            Anesthesia Physical Anesthesia Plan  ASA: 3  Anesthesia Plan: MAC   Post-op Pain Management:    Induction: Intravenous  PONV Risk Score and Plan: 3 and Treatment may vary due to age or medical condition  Airway Management Planned: Simple Face Mask  Additional Equipment:   Intra-op Plan:   Post-operative Plan:   Informed Consent: I have reviewed the patients History and Physical, chart, labs and discussed the procedure including the risks, benefits and alternatives for the proposed anesthesia with the patient or authorized representative who has indicated his/her understanding and acceptance.     Dental advisory given  Plan Discussed with: CRNA  Anesthesia Plan Comments:        Anesthesia Quick Evaluation

## 2021-07-06 NOTE — Progress Notes (Signed)
PCP - Dr. Danise Mina- [facility]  Cardiologist - Denies  EP-Denies  Endocrine-Denies  Pulm-Denies  Chest x-ray - Denies  EKG - 05/16/21 (E)  Stress Test - 11/25/03 (E)  ECHO - 05/17/21 (E)  Cardiac Cath - Denies  AICD-na PM-na LOOP-na  Dialysis-Denies  Sleep Study - Denies CPAP - Denies  LABS- 07/07/21: CBC, BMP, T/S x2, COVID  ASA-Denies  ERAS- No  HA1C-Denies  Anesthesia- No  Pt denies having chest pain, sob, or fever during the pre-op phone call. All instructions explained to the pt, with a verbal understanding of the material including: as of today,  stop taking all Aspirin (unless instructed by your doctor) and Other Aspirin containing products, Vitamins, Fish oils, and Herbal medications. Also stop all NSAIDS i.e. Advil, Ibuprofen, Motrin, Aleve, Anaprox, Naproxen, BC, Goody Powders, and all Supplements. Pt also instructed to wear a mask and social distance if she has to go before her procedure. The opportunity to ask questions was provided.    Coronavirus Screening  Have you experienced the following symptoms:  Cough yes/no: No Fever (>100.76F)  yes/no: No Runny nose yes/no: No Sore throat yes/no: No Difficulty breathing/shortness of breath  yes/no: No  Have you or a family member traveled in the last 14 days and where? yes/no: No   If the patient indicates "YES" to the above questions, their PAT will be rescheduled to limit the exposure to others and, the surgeon will be notified. THE PATIENT WILL NEED TO BE ASYMPTOMATIC FOR 14 DAYS.   If the patient is not experiencing any of these symptoms, the PAT nurse will instruct them to NOT bring anyone with them to their appointment since they may have these symptoms or traveled as well.   Please remind your patients and families that hospital visitation restrictions are in effect and the importance of the restrictions.

## 2021-07-07 ENCOUNTER — Inpatient Hospital Stay (HOSPITAL_COMMUNITY): Payer: Medicare Other

## 2021-07-07 ENCOUNTER — Inpatient Hospital Stay (HOSPITAL_COMMUNITY): Payer: Medicare Other | Admitting: Certified Registered Nurse Anesthetist

## 2021-07-07 ENCOUNTER — Inpatient Hospital Stay (HOSPITAL_COMMUNITY)
Admission: RE | Admit: 2021-07-07 | Discharge: 2021-07-08 | DRG: 027 | Disposition: A | Discharge status: Home / Self Care | Payer: Medicare Other | Attending: Neurosurgery | Admitting: Neurosurgery

## 2021-07-07 ENCOUNTER — Encounter (HOSPITAL_COMMUNITY)
Admission: RE | Disposition: A | Discharge status: Home / Self Care | Payer: Self-pay | Source: Home / Self Care | Attending: Neurosurgery

## 2021-07-07 ENCOUNTER — Encounter (HOSPITAL_COMMUNITY): Payer: Self-pay | Admitting: Neurosurgery

## 2021-07-07 DIAGNOSIS — R220 Localized swelling, mass and lump, head: Secondary | ICD-10-CM | POA: Diagnosis not present

## 2021-07-07 DIAGNOSIS — G20 Parkinson's disease: Secondary | ICD-10-CM | POA: Diagnosis not present

## 2021-07-07 DIAGNOSIS — G249 Dystonia, unspecified: Secondary | ICD-10-CM | POA: Diagnosis present

## 2021-07-07 DIAGNOSIS — Z888 Allergy status to other drugs, medicaments and biological substances status: Secondary | ICD-10-CM | POA: Diagnosis not present

## 2021-07-07 DIAGNOSIS — Z9889 Other specified postprocedural states: Secondary | ICD-10-CM | POA: Diagnosis not present

## 2021-07-07 DIAGNOSIS — Z79899 Other long term (current) drug therapy: Secondary | ICD-10-CM

## 2021-07-07 DIAGNOSIS — Z20822 Contact with and (suspected) exposure to covid-19: Secondary | ICD-10-CM | POA: Diagnosis not present

## 2021-07-07 DIAGNOSIS — I951 Orthostatic hypotension: Secondary | ICD-10-CM | POA: Diagnosis not present

## 2021-07-07 DIAGNOSIS — E785 Hyperlipidemia, unspecified: Secondary | ICD-10-CM | POA: Diagnosis not present

## 2021-07-07 DIAGNOSIS — G9389 Other specified disorders of brain: Secondary | ICD-10-CM | POA: Diagnosis not present

## 2021-07-07 DIAGNOSIS — K219 Gastro-esophageal reflux disease without esophagitis: Secondary | ICD-10-CM | POA: Diagnosis present

## 2021-07-07 DIAGNOSIS — Z01818 Encounter for other preprocedural examination: Secondary | ICD-10-CM | POA: Diagnosis not present

## 2021-07-07 HISTORY — DX: Fibromyalgia: M79.7

## 2021-07-07 HISTORY — PX: SUBTHALAMIC STIMULATOR INSERTION: SHX5375

## 2021-07-07 LAB — BASIC METABOLIC PANEL
Anion gap: 11 (ref 5–15)
BUN: 15 mg/dL (ref 8–23)
CO2: 29 mmol/L (ref 22–32)
Calcium: 9.6 mg/dL (ref 8.9–10.3)
Chloride: 99 mmol/L (ref 98–111)
Creatinine, Ser: 0.58 mg/dL (ref 0.44–1.00)
GFR, Estimated: >60 mL/min (ref >60)
Glucose, Bld: 99 mg/dL (ref 70–99)
Potassium: 3.7 mmol/L (ref 3.5–5.1)
Sodium: 139 mmol/L (ref 135–145)

## 2021-07-07 LAB — CBC
HCT: 38.4 % (ref 36.0–46.0)
Hemoglobin: 12.3 g/dL (ref 12.0–15.0)
MCH: 28.5 pg (ref 26.0–34.0)
MCHC: 32 g/dL (ref 30.0–36.0)
MCV: 89.1 fL (ref 80.0–100.0)
Platelets: 312 10*3/uL (ref 150–400)
RBC: 4.31 MIL/uL (ref 3.87–5.11)
RDW: 15 % (ref 11.5–15.5)
WBC: 6 10*3/uL (ref 4.0–10.5)
nRBC: 0 % (ref 0.0–0.2)

## 2021-07-07 LAB — SARS CORONAVIRUS 2 BY RT PCR (HOSPITAL ORDER, PERFORMED IN ~~LOC~~ HOSPITAL LAB): SARS Coronavirus 2: NEGATIVE

## 2021-07-07 LAB — TYPE AND SCREEN
ABO/RH(D): O POS
Antibody Screen: NEGATIVE

## 2021-07-07 SURGERY — SUBTHALAMIC STIMULATOR INSERTION
Anesthesia: Monitor Anesthesia Care | Site: Head | Laterality: Bilateral

## 2021-07-07 MED ORDER — HYDROCODONE-ACETAMINOPHEN 5-325 MG PO TABS: 1.0000 | ORAL_TABLET | Freq: Four times a day (QID) | ORAL | Status: DC | PRN

## 2021-07-07 MED ORDER — HYDROCODONE-ACETAMINOPHEN 5-325 MG PO TABS
2.0000 | ORAL_TABLET | ORAL | Status: DC | PRN
  Administered 2021-07-07: 2 via ORAL
  Filled 2021-07-07: qty 2

## 2021-07-07 MED ORDER — FENTANYL CITRATE (PF) 100 MCG/2ML IJ SOLN: 25.0000 ug | INTRAMUSCULAR | Status: DC | PRN

## 2021-07-07 MED ORDER — PROPOFOL 10 MG/ML IV BOLUS
INTRAVENOUS | Status: AC
  Filled 2021-07-07: qty 20

## 2021-07-07 MED ORDER — PRAMIPEXOLE DIHYDROCHLORIDE 0.25 MG PO TABS
0.5000 mg | ORAL_TABLET | Freq: Three times a day (TID) | ORAL | Status: DC
  Administered 2021-07-07 – 2021-07-08 (×3): 0.5 mg via ORAL
  Filled 2021-07-07 (×4): qty 2

## 2021-07-07 MED ORDER — ONDANSETRON HCL 4 MG/2ML IJ SOLN: 4.0000 mg | Freq: Four times a day (QID) | INTRAMUSCULAR | Status: DC | PRN

## 2021-07-07 MED ORDER — SODIUM CHLORIDE 0.9% FLUSH: 3.0000 mL | Freq: Two times a day (BID) | INTRAVENOUS | Status: DC

## 2021-07-07 MED ORDER — SODIUM BICARBONATE 4.2 % IV SOLN
INTRAVENOUS | Status: DC | PRN
  Administered 2021-07-07: 4.5 mL via INTRAVENOUS

## 2021-07-07 MED ORDER — HYDROMORPHONE HCL 1 MG/ML IJ SOLN: 0.5000 mg | INTRAMUSCULAR | Status: DC | PRN

## 2021-07-07 MED ORDER — ACETAMINOPHEN ER 650 MG PO TBCR: 1300.0000 mg | EXTENDED_RELEASE_TABLET | Freq: Three times a day (TID) | ORAL | Status: DC | PRN

## 2021-07-07 MED ORDER — PANTOPRAZOLE SODIUM 40 MG IV SOLR
40.0000 mg | Freq: Every day | INTRAVENOUS | Status: DC
  Administered 2021-07-07: 40 mg via INTRAVENOUS
  Filled 2021-07-07: qty 40

## 2021-07-07 MED ORDER — LIDOCAINE-EPINEPHRINE 1 %-1:100000 IJ SOLN
INTRAMUSCULAR | Status: DC | PRN
  Administered 2021-07-07: 17.75 mL

## 2021-07-07 MED ORDER — DEXMEDETOMIDINE HCL IN NACL 400 MCG/100ML IV SOLN
INTRAVENOUS | Status: DC | PRN
  Administered 2021-07-07: .7 ug/kg/h via INTRAVENOUS

## 2021-07-07 MED ORDER — CHLORHEXIDINE GLUCONATE CLOTH 2 % EX PADS: 6.0000 | MEDICATED_PAD | Freq: Once | CUTANEOUS | Status: DC

## 2021-07-07 MED ORDER — POLYETHYLENE GLYCOL 3350 17 G PO PACK: 17.0000 g | PACK | Freq: Every day | ORAL | Status: DC | PRN

## 2021-07-07 MED ORDER — FENTANYL CITRATE (PF) 250 MCG/5ML IJ SOLN
INTRAMUSCULAR | Status: AC
  Filled 2021-07-07: qty 5

## 2021-07-07 MED ORDER — CARBIDOPA-LEVODOPA 25-100 MG PO TABS
0.5000 | ORAL_TABLET | Freq: Every day | ORAL | Status: DC | PRN
  Filled 2021-07-07: qty 0.5

## 2021-07-07 MED ORDER — CALCIUM CARBONATE-VITAMIN D 600-400 MG-UNIT PO CHEW: 1.0000 | CHEWABLE_TABLET | Freq: Every day | ORAL | Status: DC

## 2021-07-07 MED ORDER — CARBIDOPA-LEVODOPA 25-100 MG PO TABS
1.0000 | ORAL_TABLET | ORAL | Status: DC
  Administered 2021-07-07 – 2021-07-08 (×2): 1 via ORAL
  Filled 2021-07-07 (×4): qty 1

## 2021-07-07 MED ORDER — GLYCERIN-HYPROMELLOSE-PEG 400 0.2-0.2-1 % OP SOLN: 1.0000 [drp] | Freq: Every day | OPHTHALMIC | Status: DC | PRN

## 2021-07-07 MED ORDER — SODIUM CHLORIDE 0.9% FLUSH: 3.0000 mL | INTRAVENOUS | Status: DC | PRN

## 2021-07-07 MED ORDER — CHLORHEXIDINE GLUCONATE 0.12 % MT SOLN
15.0000 mL | Freq: Once | OROMUCOSAL | Status: AC
  Administered 2021-07-07: 15 mL via OROMUCOSAL
  Filled 2021-07-07: qty 15

## 2021-07-07 MED ORDER — PHENOL 1.4 % MT LIQD: 1.0000 | OROMUCOSAL | Status: DC | PRN

## 2021-07-07 MED ORDER — CALCIUM CARBONATE-VITAMIN D 500-200 MG-UNIT PO TABS
1.0000 | ORAL_TABLET | Freq: Every day | ORAL | Status: DC
  Administered 2021-07-08: 1 via ORAL
  Filled 2021-07-07: qty 1

## 2021-07-07 MED ORDER — CARBIDOPA-LEVODOPA 25-100 MG PO TABS
2.0000 | ORAL_TABLET | Freq: Every day | ORAL | Status: DC
  Administered 2021-07-08: 2 via ORAL
  Filled 2021-07-07: qty 2

## 2021-07-07 MED ORDER — THROMBIN 20000 UNITS EX SOLR
CUTANEOUS | Status: AC
  Filled 2021-07-07: qty 20000

## 2021-07-07 MED ORDER — ZOLPIDEM TARTRATE 5 MG PO TABS: 5.0000 mg | ORAL_TABLET | Freq: Every evening | ORAL | Status: DC | PRN

## 2021-07-07 MED ORDER — ORAL CARE MOUTH RINSE: 15.0000 mL | Freq: Once | OROMUCOSAL | Status: AC

## 2021-07-07 MED ORDER — ALBUMIN HUMAN 5 % IV SOLN: 12.5000 g | Freq: Once | INTRAVENOUS | Status: DC

## 2021-07-07 MED ORDER — TRAMADOL HCL 50 MG PO TABS
50.0000 mg | ORAL_TABLET | Freq: Four times a day (QID) | ORAL | Status: DC | PRN
  Administered 2021-07-07 – 2021-07-08 (×2): 50 mg via ORAL
  Filled 2021-07-07 (×2): qty 1

## 2021-07-07 MED ORDER — ACETAMINOPHEN 10 MG/ML IV SOLN: 1000.0000 mg | Freq: Once | INTRAVENOUS | Status: AC

## 2021-07-07 MED ORDER — PHENYLEPHRINE 40 MCG/ML (10ML) SYRINGE FOR IV PUSH (FOR BLOOD PRESSURE SUPPORT)
PREFILLED_SYRINGE | INTRAVENOUS | Status: DC | PRN
  Administered 2021-07-07 (×3): 80 ug via INTRAVENOUS

## 2021-07-07 MED ORDER — LIDOCAINE-EPINEPHRINE 1 %-1:100000 IJ SOLN
INTRAMUSCULAR | Status: AC
  Filled 2021-07-07: qty 3

## 2021-07-07 MED ORDER — FLUDROCORTISONE ACETATE 0.1 MG PO TABS
0.1000 mg | ORAL_TABLET | Freq: Every day | ORAL | Status: DC
  Administered 2021-07-08: 0.1 mg via ORAL
  Filled 2021-07-07: qty 1

## 2021-07-07 MED ORDER — BISACODYL 10 MG RE SUPP: 10.0000 mg | Freq: Every day | RECTAL | Status: DC | PRN

## 2021-07-07 MED ORDER — PHENYLEPHRINE 40 MCG/ML (10ML) SYRINGE FOR IV PUSH (FOR BLOOD PRESSURE SUPPORT)
PREFILLED_SYRINGE | INTRAVENOUS | Status: AC
  Filled 2021-07-07: qty 10

## 2021-07-07 MED ORDER — POLYVINYL ALCOHOL 1.4 % OP SOLN
1.0000 [drp] | Freq: Every day | OPHTHALMIC | Status: DC | PRN
  Filled 2021-07-07: qty 15

## 2021-07-07 MED ORDER — FLEET ENEMA 7-19 GM/118ML RE ENEM: 1.0000 | ENEMA | Freq: Once | RECTAL | Status: DC | PRN

## 2021-07-07 MED ORDER — DEXMEDETOMIDINE (PRECEDEX) IN NS 20 MCG/5ML (4 MCG/ML) IV SYRINGE
PREFILLED_SYRINGE | INTRAVENOUS | Status: DC | PRN
  Administered 2021-07-07 (×2): 8 ug via INTRAVENOUS

## 2021-07-07 MED ORDER — BACITRACIN ZINC 500 UNIT/GM EX OINT
TOPICAL_OINTMENT | CUTANEOUS | Status: AC
  Filled 2021-07-07: qty 28.35

## 2021-07-07 MED ORDER — LATANOPROST 0.005 % OP SOLN
1.0000 [drp] | Freq: Every day | OPHTHALMIC | Status: DC
  Administered 2021-07-07: 1 [drp] via OPHTHALMIC
  Filled 2021-07-07: qty 2.5

## 2021-07-07 MED ORDER — CEFAZOLIN SODIUM-DEXTROSE 2-4 GM/100ML-% IV SOLN
2.0000 g | Freq: Three times a day (TID) | INTRAVENOUS | Status: AC
Start: 2021-07-07 — End: 2021-07-07
  Administered 2021-07-07 (×2): 2 g via INTRAVENOUS
  Filled 2021-07-07 (×2): qty 100

## 2021-07-07 MED ORDER — ACETAMINOPHEN 650 MG RE SUPP: 650.0000 mg | RECTAL | Status: DC | PRN

## 2021-07-07 MED ORDER — NYSTATIN-TRIAMCINOLONE 100000-0.1 UNIT/GM-% EX OINT: 1.0000 "application " | TOPICAL_OINTMENT | Freq: Two times a day (BID) | CUTANEOUS | Status: DC | PRN

## 2021-07-07 MED ORDER — NYSTATIN-TRIAMCINOLONE 100000-0.1 UNIT/GM-% EX OINT: TOPICAL_OINTMENT | Freq: Two times a day (BID) | CUTANEOUS | Status: DC

## 2021-07-07 MED ORDER — 0.9 % SODIUM CHLORIDE (POUR BTL) OPTIME
TOPICAL | Status: DC | PRN
  Administered 2021-07-07: 1000 mL

## 2021-07-07 MED ORDER — SERTRALINE HCL 50 MG PO TABS
100.0000 mg | ORAL_TABLET | Freq: Every day | ORAL | Status: DC
  Administered 2021-07-08: 100 mg via ORAL
  Filled 2021-07-07: qty 2

## 2021-07-07 MED ORDER — POLYETHYLENE GLYCOL 3350 17 G PO PACK: 17.0000 g | PACK | ORAL | Status: DC | PRN

## 2021-07-07 MED ORDER — ACETAMINOPHEN 10 MG/ML IV SOLN: 1000.0000 mg | Freq: Four times a day (QID) | INTRAVENOUS | Status: DC

## 2021-07-07 MED ORDER — CLOTRIMAZOLE 1 % EX CREA
1.0000 | TOPICAL_CREAM | Freq: Two times a day (BID) | CUTANEOUS | Status: DC
Start: 2021-07-07 — End: 2021-07-08
  Filled 2021-07-07: qty 15

## 2021-07-07 MED ORDER — SODIUM CHLORIDE 0.9 % IV SOLN: 250.0000 mL | INTRAVENOUS | Status: DC

## 2021-07-07 MED ORDER — BUPIVACAINE HCL 0.5 % IJ SOLN
INTRAMUSCULAR | Status: DC | PRN
  Administered 2021-07-07: 17.75 mL

## 2021-07-07 MED ORDER — SODIUM CHLORIDE 0.9 % IV SOLN: INTRAVENOUS | Status: DC | PRN

## 2021-07-07 MED ORDER — CLONAZEPAM 0.5 MG PO TABS
0.5000 mg | ORAL_TABLET | Freq: Two times a day (BID) | ORAL | Status: DC
  Administered 2021-07-07 – 2021-07-08 (×2): 0.5 mg via ORAL
  Filled 2021-07-07 (×2): qty 1

## 2021-07-07 MED ORDER — ONDANSETRON HCL 4 MG PO TABS
4.0000 mg | ORAL_TABLET | Freq: Four times a day (QID) | ORAL | Status: DC | PRN
Start: 2021-07-07 — End: 2021-07-08

## 2021-07-07 MED ORDER — THROMBIN 5000 UNITS EX SOLR
CUTANEOUS | Status: AC
  Filled 2021-07-07: qty 5000

## 2021-07-07 MED ORDER — KETOROLAC TROMETHAMINE 15 MG/ML IJ SOLN
7.5000 mg | Freq: Four times a day (QID) | INTRAMUSCULAR | Status: DC
  Administered 2021-07-07 – 2021-07-08 (×3): 7.5 mg via INTRAVENOUS
  Filled 2021-07-07 (×3): qty 1

## 2021-07-07 MED ORDER — ACETAMINOPHEN 325 MG PO TABS
650.0000 mg | ORAL_TABLET | ORAL | Status: DC | PRN
  Administered 2021-07-08 (×2): 650 mg via ORAL
  Filled 2021-07-07 (×2): qty 2

## 2021-07-07 MED ORDER — BACITRACIN ZINC 500 UNIT/GM EX OINT
TOPICAL_OINTMENT | CUTANEOUS | Status: DC | PRN
  Administered 2021-07-07: 1 via TOPICAL

## 2021-07-07 MED ORDER — CEFAZOLIN SODIUM-DEXTROSE 2-4 GM/100ML-% IV SOLN
2.0000 g | INTRAVENOUS | Status: AC
  Administered 2021-07-07: 2 g via INTRAVENOUS
  Filled 2021-07-07: qty 100

## 2021-07-07 MED ORDER — VITAMIN D 25 MCG (1000 UNIT) PO TABS
1000.0000 [IU] | ORAL_TABLET | Freq: Every day | ORAL | Status: DC
  Administered 2021-07-08: 1000 [IU] via ORAL
  Filled 2021-07-07: qty 1

## 2021-07-07 MED ORDER — BUPIVACAINE HCL (PF) 0.5 % IJ SOLN
INTRAMUSCULAR | Status: AC
  Filled 2021-07-07: qty 90

## 2021-07-07 MED ORDER — CYANOCOBALAMIN 1000 MCG/ML IJ SOLN
1000.0000 ug | INTRAMUSCULAR | Status: DC
  Filled 2021-07-07: qty 1

## 2021-07-07 MED ORDER — ACETAMINOPHEN 10 MG/ML IV SOLN
INTRAVENOUS | Status: AC
  Filled 2021-07-07: qty 100

## 2021-07-07 MED ORDER — ALBUMIN HUMAN 5 % IV SOLN
INTRAVENOUS | Status: AC
  Filled 2021-07-07: qty 250

## 2021-07-07 MED ORDER — LACTATED RINGERS IV SOLN: INTRAVENOUS | Status: DC

## 2021-07-07 MED ORDER — CARBIDOPA-LEVODOPA ER 50-200 MG PO TBCR
1.0000 | EXTENDED_RELEASE_TABLET | Freq: Every day | ORAL | Status: DC
  Administered 2021-07-07: 1 via ORAL
  Filled 2021-07-07 (×2): qty 1

## 2021-07-07 MED ORDER — MENTHOL 3 MG MT LOZG: 1.0000 | LOZENGE | OROMUCOSAL | Status: DC | PRN

## 2021-07-07 MED ORDER — ALUM & MAG HYDROXIDE-SIMETH 200-200-20 MG/5ML PO SUSP: 30.0000 mL | Freq: Four times a day (QID) | ORAL | Status: DC | PRN

## 2021-07-07 MED ORDER — DOCUSATE SODIUM 100 MG PO CAPS
100.0000 mg | ORAL_CAPSULE | Freq: Two times a day (BID) | ORAL | Status: DC
  Administered 2021-07-07 – 2021-07-08 (×2): 100 mg via ORAL
  Filled 2021-07-07 (×2): qty 1

## 2021-07-07 MED ORDER — KCL IN DEXTROSE-NACL 20-5-0.45 MEQ/L-%-% IV SOLN: INTRAVENOUS | Status: DC

## 2021-07-07 MED ORDER — THROMBIN 5000 UNITS EX SOLR
OROMUCOSAL | Status: DC | PRN
  Administered 2021-07-07: 5 mL via TOPICAL

## 2021-07-07 SURGICAL SUPPLY — 71 items
BAG COUNTER SPONGE SURGICOUNT (BAG) ×4 IMPLANT
BIT DRILL NEURO 2X3.1 SFT TUCH (MISCELLANEOUS) IMPLANT
BLADE CLIPPER SURG (BLADE) ×2 IMPLANT
BLADE SURG 11 STRL SS (BLADE) ×2 IMPLANT
BNDG ADH 1X3 SHEER STRL LF (GAUZE/BANDAGES/DRESSINGS) IMPLANT
BNDG GAUZE ELAST 4 BULKY (GAUZE/BANDAGES/DRESSINGS) ×4 IMPLANT
BOOT SUTURE AID YELLOW STND (SUTURE) ×2 IMPLANT
CABLE EXTENSION OR (CABLE) ×2
CABLE EXTENSION OR 8 CONTACT (CABLE) ×1 IMPLANT
CABLE LEAD 1.5 GUIDELINE 5.0 (CABLE) ×2 IMPLANT
CANISTER SUCT 3000ML PPV (MISCELLANEOUS) ×2 IMPLANT
CARTRIDGE OIL MAESTRO DRILL (MISCELLANEOUS) ×1 IMPLANT
CLIP RANEY DISP (INSTRUMENTS) ×4 IMPLANT
DECANTER SPIKE VIAL GLASS SM (MISCELLANEOUS) ×8 IMPLANT
DIFFUSER DRILL AIR PNEUMATIC (MISCELLANEOUS) ×2 IMPLANT
DRAPE STERI IOBAN 125X83 (DRAPES) IMPLANT
DRILL NEURO 2X3.1 SOFT TOUCH (MISCELLANEOUS)
DRSG OPSITE 4X5.5 SM (GAUZE/BANDAGES/DRESSINGS) ×4 IMPLANT
DRSG TEGADERM 2-3/8X2-3/4 SM (GAUZE/BANDAGES/DRESSINGS) ×16 IMPLANT
DRSG TELFA 3X8 NADH (GAUZE/BANDAGES/DRESSINGS) ×4 IMPLANT
DURAPREP 26ML APPLICATOR (WOUND CARE) ×2 IMPLANT
GAUZE 4X4 16PLY ~~LOC~~+RFID DBL (SPONGE) ×2 IMPLANT
GAUZE SPONGE 4X4 12PLY STRL (GAUZE/BANDAGES/DRESSINGS) IMPLANT
GLOVE EXAM NITRILE XL STR (GLOVE) IMPLANT
GLOVE SRG 8 PF TXTR STRL LF DI (GLOVE) ×5 IMPLANT
GLOVE SURG ENC MOIS LTX SZ8 (GLOVE) ×6 IMPLANT
GLOVE SURG LTX SZ8 (GLOVE) ×6 IMPLANT
GLOVE SURG UNDER POLY LF SZ7 (GLOVE) ×4 IMPLANT
GLOVE SURG UNDER POLY LF SZ8 (GLOVE) ×10
GLOVE SURG UNDER POLY LF SZ8.5 (GLOVE) ×6 IMPLANT
GOWN STRL REUS W/ TWL LRG LVL3 (GOWN DISPOSABLE) IMPLANT
GOWN STRL REUS W/ TWL XL LVL3 (GOWN DISPOSABLE) ×1 IMPLANT
GOWN STRL REUS W/TWL 2XL LVL3 (GOWN DISPOSABLE) ×4 IMPLANT
GOWN STRL REUS W/TWL LRG LVL3 (GOWN DISPOSABLE)
GOWN STRL REUS W/TWL XL LVL3 (GOWN DISPOSABLE) ×2
HEMOSTAT POWDER KIT SURGIFOAM (HEMOSTASIS) ×2 IMPLANT
KIT BASIN OR (CUSTOM PROCEDURE TRAY) ×2 IMPLANT
KIT COVER BURR HOLE (Miscellaneous) ×4 IMPLANT
KIT HEADREST PASSIVE (NEUROSURGERY SUPPLIES) ×2 IMPLANT
KIT NEUROSTIM DBS 45 8 CONT (Miscellaneous) ×2 IMPLANT
KIT NEUROSTIM DBS W/BURR COVER (Miscellaneous) ×2 IMPLANT
KIT PLATFORM UNI 4LEG STARFIX (KITS) IMPLANT
KIT SURETEK BURR HOLE COVER SP (Miscellaneous) ×2 IMPLANT
KIT SURGICAL STARFIX WAYFORM (KITS) ×2 IMPLANT
KIT SUTURE REMOVAL HAMOT (SET/KITS/TRAYS/PACK) ×2 IMPLANT
KIT TURNOVER KIT B (KITS) ×2 IMPLANT
LEAD KIT DBS DIRECTIONAL 45C (Miscellaneous) ×4 IMPLANT
MARKER SKIN DUAL TIP RULER LAB (MISCELLANEOUS) ×4 IMPLANT
NEEDLE HYPO 25X1 1.5 SAFETY (NEEDLE) ×4 IMPLANT
NEEDLE SPNL 18GX3.5 QUINCKE PK (NEEDLE) ×2 IMPLANT
NEEDLE SPNL 22GX3.5 QUINCKE BK (NEEDLE) IMPLANT
NS IRRIG 1000ML POUR BTL (IV SOLUTION) ×2 IMPLANT
OIL CARTRIDGE MAESTRO DRILL (MISCELLANEOUS) ×2
PACK LAMINECTOMY NEURO (CUSTOM PROCEDURE TRAY) ×2 IMPLANT
PAD ARMBOARD 7.5X6 YLW CONV (MISCELLANEOUS) ×4 IMPLANT
PERFORATOR LRG  14-11MM (BIT) ×2
PERFORATOR LRG 14-11MM (BIT) ×1 IMPLANT
PLATEFORM ARRAY DZAP LEADPOINT (MISCELLANEOUS) ×2
PLATFORM ARRAY DZAP LEADPOINT (MISCELLANEOUS) ×1 IMPLANT
PLATFORM BILATERAL KIT (MISCELLANEOUS) ×2 IMPLANT
SET SINGLE IT STRL (KITS) ×2 IMPLANT
SPONGE SURGIFOAM ABS GEL SZ50 (HEMOSTASIS) IMPLANT
SPONGE T-LAP 4X18 ~~LOC~~+RFID (SPONGE) ×2 IMPLANT
STAPLER SKIN PROX WIDE 3.9 (STAPLE) ×2 IMPLANT
SUT ETHILON 3 0 PS 1 (SUTURE) IMPLANT
SUT SILK 2 0 TIES 10X30 (SUTURE) ×2 IMPLANT
SUT VIC AB 2-0 CP2 18 (SUTURE) ×4 IMPLANT
TOWEL GREEN STERILE (TOWEL DISPOSABLE) ×2 IMPLANT
TOWEL GREEN STERILE FF (TOWEL DISPOSABLE) ×2 IMPLANT
TRAY FOLEY MTR SLVR 16FR STAT (SET/KITS/TRAYS/PACK) ×2 IMPLANT
WATER STERILE IRR 1000ML POUR (IV SOLUTION) ×2 IMPLANT

## 2021-07-07 NOTE — Interval H&P Note (Signed)
History and Physical Interval Note:  07/07/2021 7:20 AM  [name]  has presented today for surgery, with the diagnosis of Parkinsons.  The various methods of treatment have been discussed with the patient and family. After consideration of risks, benefits and other options for treatment, the patient has consented to  Procedure(s) with comments: Deep brain stimulator placement (N/A) - RM 21 as a surgical intervention.  The patient's history has been reviewed, patient examined, no change in status, stable for surgery.  I have reviewed the patient's chart and labs.  Questions were answered to the patient's satisfaction.     Peggyann Shoals

## 2021-07-07 NOTE — Op Note (Signed)
07/07/2021  11:09 AM  PATIENT:  [name]  75 y.o. female  PRE-OPERATIVE DIAGNOSIS:  Parkinsons Disease  POST-OPERATIVE DIAGNOSIS:  Parkinsons Disease  PROCEDURE:  Procedure(s): Deep brain stimulator placement (Bilateral) with microelectrode recordings  SURGEON:  Surgeon(s) and Role:    Tara Levine, MD - Primary  PHYSICIAN ASSISTANT:   ASSISTANTS: Poteat, RN   ANESTHESIA:   local and IV sedation  EBL:  20 mL   BLOOD ADMINISTERED:none  DRAINS: none   LOCAL MEDICATIONS USED:  MARCAINE    and LIDOCAINE   SPECIMEN:  No Specimen  DISPOSITION OF SPECIMEN:  N/A  COUNTS:  YES  TOURNIQUET:  * No tourniquets in log *  DICTATION:DICTATION:  Indications: Patient is a 75 year old woman with Parkinson's Disease. It was elected to take her to surgery for bilateral STN  deep brain stimulator electrode placement  Procedure: Preoperative planing was performed with volumetricCT and placement of 4 fiducial markers in the skull followed by CT of the brain also obtained volumetrically. These were then exported to create Starfix head frame with planned targeting of STN nucleus electrodes was performed. The patient was brought to the operating room and placed in a semi-Fowler's position with his neck and stabilized in a neck holder. This was affixed to the Mayfield adapter. Her scalp was then prepped with betadine scrub and DuraPrep and subsequently draped with an Ioban drape.with the fiducials and the skin was infiltrated with local lidocaine.  The areas of planned incision were then infiltrated with local lidocaine with epinephrine. The stepoffs were connected and the Starfix frame was assembled.  The entry points were then marked.  One linear incision was made centered on the bilateral entry points. An elevator was used to clear pericranium from the skull. The perforator it was then used to produce two 14 mm bur holes. The dura was coagulated with bipolar electrocautery. Initially we  operated on the left and subsequently on the right. After opening the dura and a localizing the entry point the stylette and outer cannula were inserted into the brain. Surgifoam was placed to prevent CSF leakage at each entry site. Microelectrode recordings were then performed and  we had very good recordings from the STN. Subsequently the stimulating electrode was placed and the patient had significant improvement in tremor and rigidity on the right side of the body without significant side effects to higher voltages. The electrode was then locked into position with the stimlock cap. The redundant electrode was circularized and tunneled under the scalp. The assembly was reassembled on the right.  The initial pass with the microelectrode was quite silent, but we then moved posteriorly 2 mm and here there was good microelectrode recordings throughout the STN and after placing the stimulating electrode the patient had excellent control of tremor and rigidity on the left side of her body.  We therefore elected to place this electrode and locked into position and the stereotactic assembly was disassembled. The electrodes were tunneled to the right and then into the posterior scalp and locked into position. The wounds were then irrigated and closed with 2-0 Vicryl sutures and staples. The fiducials were removed and staples were placed over each of these sites. The head was washed and then sterile occlusive dressings were placed. The patient was taken to recovery having tolerated her procedure without difficulty or untoward effect. Please refer to detailed microelectrode recordings from Dr. Carles [name] for more specifics of the positioning of the electrodes. These are included in her Epic note from  the detailed neural monitoring and physical exam assessment during the surgery.    PLAN OF CARE: Admit to inpatient   PATIENT DISPOSITION:  PACU - hemodynamically stable.   Delay start of Pharmacological VTE agent (>24hrs) due  to surgical blood loss or risk of bleeding: yes

## 2021-07-07 NOTE — Anesthesia Postprocedure Evaluation (Signed)
Anesthesia Post Note  Patient: [name]  Procedure(s) Performed: Deep brain stimulator placement (Bilateral: Head)     Patient location during evaluation: PACU Anesthesia Type: MAC Level of consciousness: awake and alert Pain management: pain level controlled Vital Signs Assessment: post-procedure vital signs reviewed and stable Respiratory status: spontaneous breathing, nonlabored ventilation and respiratory function stable Cardiovascular status: stable and blood pressure returned to baseline Postop Assessment: no apparent nausea or vomiting Anesthetic complications: no   No notable events documented.  Last Vitals:  Vitals:   07/07/21 1320 07/07/21 1409  BP: 119/62 (!) 143/74  Pulse: 70 74  Resp: 15 20  Temp: (!) 36.1 C   SpO2: 100% 98%    Last Pain:  Vitals:   07/07/21 1320  TempSrc:   PainSc: 7                  Travell Desaulniers,W. EDMOND

## 2021-07-07 NOTE — Brief Op Note (Signed)
07/07/2021  11:09 AM  PATIENT:  [name]  75 y.o. female  PRE-OPERATIVE DIAGNOSIS:  Parkinsons Disease  POST-OPERATIVE DIAGNOSIS:  Parkinsons Disease  PROCEDURE:  Procedure(s): Deep brain stimulator placement (Bilateral) with microelectrode recordings  SURGEON:  Surgeon(s) and Role:    Erline Levine, MD - Primary  PHYSICIAN ASSISTANT:   ASSISTANTS: Poteat, RN   ANESTHESIA:   local and IV sedation  EBL:  20 mL   BLOOD ADMINISTERED:none  DRAINS: none   LOCAL MEDICATIONS USED:  MARCAINE    and LIDOCAINE   SPECIMEN:  No Specimen  DISPOSITION OF SPECIMEN:  N/A  COUNTS:  YES  TOURNIQUET:  * No tourniquets in log *  DICTATION:DICTATION:  Indications: Patient is a 75 year old woman with Parkinson's Disease. It was elected to take her to surgery for bilateral STN  deep brain stimulator electrode placement  Procedure: Preoperative planing was performed with volumetricCT and placement of 4 fiducial markers in the skull followed by CT of the brain also obtained volumetrically. These were then exported to create Starfix head frame with planned targeting of STN nucleus electrodes was performed. The patient was brought to the operating room and placed in a semi-Fowler's position with his neck and stabilized in a neck holder. This was affixed to the Mayfield adapter. Her scalp was then prepped with betadine scrub and DuraPrep and subsequently draped with an Ioban drape.with the fiducials and the skin was infiltrated with local lidocaine.  The areas of planned incision were then infiltrated with local lidocaine with epinephrine. The stepoffs were connected and the Starfix frame was assembled.  The entry points were then marked.  One linear incision was made centered on the bilateral entry points. An elevator was used to clear pericranium from the skull. The perforator it was then used to produce two 14 mm bur holes. The dura was coagulated with bipolar electrocautery. Initially we  operated on the left and subsequently on the right. After opening the dura and a localizing the entry point the stylette and outer cannula were inserted into the brain. Surgifoam was placed to prevent CSF leakage at each entry site. Microelectrode recordings were then performed and  we had very good recordings from the STN. Subsequently the stimulating electrode was placed and the patient had significant improvement in tremor and rigidity on the right side of the body without significant side effects to higher voltages. The electrode was then locked into position with the stimlock cap. The redundant electrode was circularized and tunneled under the scalp. The assembly was reassembled on the right.  The initial pass with the microelectrode was quite silent, but we then moved posteriorly 2 mm and here there was good microelectrode recordings throughout the STN and after placing the stimulating electrode the patient had excellent control of tremor and rigidity on the left side of her body.  We therefore elected to place this electrode and locked into position and the stereotactic assembly was disassembled. The electrodes were tunneled to the right and then into the posterior scalp and locked into position. The wounds were then irrigated and closed with 2-0 Vicryl sutures and staples. The fiducials were removed and staples were placed over each of these sites. The head was washed and then sterile occlusive dressings were placed. The patient was taken to recovery having tolerated her procedure without difficulty or untoward effect. Please refer to detailed microelectrode recordings from Dr. Carles Collet for more specifics of the positioning of the electrodes. These are included in her Epic note from  the detailed neural monitoring and physical exam assessment during the surgery.    PLAN OF CARE: Admit to inpatient   PATIENT DISPOSITION:  PACU - hemodynamically stable.   Delay start of Pharmacological VTE agent (>24hrs) due  to surgical blood loss or risk of bleeding: yes

## 2021-07-07 NOTE — Op Note (Signed)
Preoperative diagnosis:  Parkinsons Disease  Postoperative diagnosis:  Parkinsons Disease  Surgeon:  Erline Levine Neurologist:  Wells Guiles Avian [place]  Indication for procedure: Determination of optimal electrode position for deep brain stimulation therapy. Complications: None   Description of procedure:  Following the incision and burr hole placement, a recording microelectrode was slowly advanced into the brain via a motorized drive.  Beginning approximately 10 mm above the target, recordings were periodically made, and the resultant brain activity was described on the neurophysiologic recording worksheet.  Relevant samples of the recordings were saved for subsequent off line analysis.  A total of one recording passes was obtained on the left side of the brain.    5 mm of STN  were recorded from the left side of the brain. The sensory-motor subregion of the STN was identified. Following the recording, the recording electrode was replaced by a stimulating electrode by Dr. Vertell Limber.  Test stimulations were then obtained, first with the microelectrode and then with the DBS electrode.  Active contacts that were used and the response to stimulation, including both adverse effects and therapeutic effects, were recorded on the neurophysiologic stimulation worksheet.  In brief, there was complete resolution of tremor, rigidity, and bradykinesia at therapeutic voltages.   Adverse effects only transiently and consisted of fingertip paresthesias.  A target of mirror image of the initial side was selected for the contralateral side and 2 recording passes were obtained on the opposite side of the brain.  The initial pass demonstrated pretty quiet recording, although microelectrode test stim at target and 3 mm above target demonstrated some efficacy.  It was decided to move the electrode 2 mm posterior and a very good recording was obtained.  5 mm of STN were recorded from the right side of the brain.  The sensory-motor  subregion was identified.  Tremor cells and response to passive stimulation were noted.  Test stimulations were then obtained, first with the microelectrode and then with the DBS electrode.  Again, there was resolution of Parkinsonian sx's of tremor, rigidity and bradykinesia were noted at therapeutic voltages with no sustained side effects occurring at therapeutic voltages.   Final electrode position was determined and the electrode was secured in place by Dr. Vertell Limber on each side.  Alonza Bogus, DO [facility] Director of Movement Disorders

## 2021-07-07 NOTE — Transfer of Care (Signed)
Immediate Anesthesia Transfer of Care Note  Patient: [name]  Procedure(s) Performed: Deep brain stimulator placement (Bilateral: Head)  Patient Location: PACU  Anesthesia Type:MAC  Level of Consciousness: drowsy  Airway & Oxygen Therapy: Patient Spontanous Breathing and Patient connected to face mask oxygen  Post-op Assessment: Report given to RN, Post -op Vital signs reviewed and stable and Patient moving all extremities X 4  Post vital signs: Reviewed and stable  Last Vitals:  Vitals Value Taken Time  BP 146/74 07/07/21 1124  Temp    Pulse 69 07/07/21 1128  Resp 14 07/07/21 1128  SpO2 100 % 07/07/21 1128  Vitals shown include unvalidated device data.  Last Pain:  Vitals:   07/07/21 0647  TempSrc:   PainSc: 0-No pain      Patients Stated Pain Goal: 3 (63/[id number]/56 [id number])  Complications: No notable events documented.

## 2021-07-08 ENCOUNTER — Encounter (HOSPITAL_COMMUNITY): Payer: Self-pay | Admitting: Neurosurgery

## 2021-07-08 ENCOUNTER — Inpatient Hospital Stay (HOSPITAL_COMMUNITY): Payer: Medicare Other

## 2021-07-08 MED ORDER — TRAMADOL HCL 50 MG PO TABS: 50.0000 mg | ORAL_TABLET | Freq: Four times a day (QID) | ORAL | 0 refills | Status: DC | PRN

## 2021-07-08 NOTE — Evaluation (Signed)
Occupational Therapy Evaluation Patient Details Name: [name] MRN: [id number] DOB: [birth year] Today's Date: 07/08/2021    History of Present Illness 75 yo female s/p deep brain stimulator placement (bilateral). PMH including Anxiety, Arthritis, CAD, Cardiomyopathy, Cataract, CHF, Fibromyalgia, HTN, Lupus, Parkinson's disease ([place]) (08/25/2015), and Takotsubo cardiomyopathy (2008).   Clinical Impression   PTA, pt was living with her daughters and was performing BADLs and using RW for mobility; family assisting with ADLs as she needs. Pt currently performing ADLs and functional mobility at [facility] A level. Providing education on compensatory techniques for no bending to decrease headaches as well as education on adapting environment to decrease headaches. Recommend dc to home once medically stable per physician. All acute OT needs met and will sign off.     Follow Up Recommendations  No OT follow up    Equipment Recommendations  None recommended by OT    Recommendations for Other Services PT consult     Precautions / Restrictions Precautions Precautions: Fall Precaution Comments: Provided handouts for decreasing bending forward and techniques for decreasing headaches      Mobility Bed Mobility Overal bed mobility: Needs Assistance Bed Mobility: Rolling;Sidelying to Sit;Sit to Sidelying Rolling: Supervision Sidelying to sit: Supervision     Sit to sidelying: Supervision      Transfers Overall transfer level: Needs assistance Equipment used: Rolling walker (2 wheeled) Transfers: Sit to/from Stand Sit to Stand: Supervision         General transfer comment: supervision for safety    Balance Overall balance assessment: Needs assistance Sitting-balance support: No upper extremity supported;Feet supported Sitting balance-Leahy Scale: Good     Standing balance support: Bilateral upper extremity supported;During functional activity Standing  balance-Leahy Scale: Poor                             ADL either performed or assessed with clinical judgement   ADL Overall ADL's : Needs assistance/impaired                                       General ADL Comments: Pt performing ADLs and functional mobility at [employer] A for safety. Providing handout on comepnsatory tehcniques for no bending to decrease HA. Also handout on concussion for information on changing environment to decrease HA     Vision         Perception     Praxis      Pertinent Vitals/Pain Pain Assessment: Faces Faces Pain Scale: Hurts little more Pain Location: head Pain Descriptors / Indicators: Discomfort Pain Intervention(s): Monitored during session;Repositioned     Hand Dominance Right   Extremity/Trunk Assessment Upper Extremity Assessment Upper Extremity Assessment: Overall WFL for tasks assessed (jerky movements at rest)   Lower Extremity Assessment Lower Extremity Assessment: Defer to PT evaluation   Cervical / Trunk Assessment Cervical / Trunk Assessment: Other exceptions Cervical / Trunk Exceptions: s/p brain stimulator   Communication Communication Communication: No difficulties   Cognition Arousal/Alertness: Awake/alert Behavior During Therapy: WFL for tasks assessed/performed Overall Cognitive Status: Within Functional Limits for tasks assessed                                     General Comments  son present throughout session    Exercises  Shoulder Instructions      Home Living Family/patient expects to be discharged to:: Private residence Living Arrangements: Children Available Help at Discharge: Family;Available 24 hours/day Type of Home: House Home Access: Stairs to enter [employer] of Steps: 1 Entrance Stairs-Rails: Left;Right;Can reach both Home Layout: Two level;Able to live on main level with bedroom/bathroom     Bathroom Shower/Tub:  Tub/shower unit (upstairs)   Bathroom Toilet: Standard     Home Equipment: Bedside commode;Cane - single point;Walker - 4 wheels;Wheelchair - manual;Shower seat          Prior Functioning/Environment Level of Independence: Needs assistance  Gait / Transfers Assistance Needed: Uses RW ADL's / Homemaking Assistance Needed: Supervision for safety. Family will assist as she needs pending "stiffness"            OT Problem List: Decreased activity tolerance;Impaired balance (sitting and/or standing);Decreased knowledge of precautions;Decreased knowledge of use of DME or AE;Pain      OT Treatment/Interventions:      OT Goals(Current goals can be found in the care plan section) Acute Rehab OT Goals Patient Stated Goal: Go home OT Goal Formulation: With patient/family Time For Goal Achievement: 07/22/21 Potential to Achieve Goals: Good  OT Frequency:     Barriers to D/C:            Co-evaluation              AM-PAC OT "6 Clicks" Daily Activity     Outcome Measure Help from another person eating meals?: None Help from another person taking care of personal grooming?: None Help from another person toileting, which includes using toliet, bedpan, or urinal?: A Little Help from another person bathing (including washing, rinsing, drying)?: A Little Help from another person to put on and taking off regular upper body clothing?: None Help from another person to put on and taking off regular lower body clothing?: A Little 6 Click Score: 21   End of Session Equipment Utilized During Treatment: Gait belt;Rolling walker Nurse Communication: Mobility status  Activity Tolerance: Patient tolerated treatment well Patient left: in chair;with call bell/phone within reach  OT Visit Diagnosis: Other abnormalities of gait and mobility (R26.89);Pain Pain - part of body:  (HA)                Time: 0037-0488 OT Time Calculation (min): 20 min Charges:  OT General Charges $OT Visit: 1  Visit OT Evaluation $OT Eval Moderate Complexity: [place], OTR/L Acute Rehab Pager: [phone] Office: [place] 07/08/2021, 9:39 AM

## 2021-07-08 NOTE — H&P (View-Only) (Signed)
Subjective: Patient reports  "I feel ok... a little headache overnight"  Objective: Vital signs in last 24 hours: Temp:  [96.7 F (35.9 C)-98.2 F (36.8 C)] 98.2 F (36.8 C) (08/12 0319) Pulse Rate:  [64-88] 88 (08/12 0319) Resp:  [11-20] 16 (08/12 0319) BP: (76-143)/(43-74) 107/66 (08/12 0319) SpO2:  [95 %-100 %] 97 % (08/12 0319)  Intake/Output from previous day: 08/11 0701 - 08/12 0700 In: 500 [I.V.:400; IV Piggyback:100] Out: 645 [Urine:625; Blood:20] Intake/Output this shift: No intake/output data recorded.  Alert, conversant. MAEW. Parkinsonian dyskinesias at baseline. Voice is clear, soft, also baseline. Drsgs intact scalp, changed x2 overnight. Reported bloody drainage.   Lab Results: Recent Labs    07/07/21 0554  WBC 6.0  HGB 12.3  HCT 38.4  PLT 312   BMET Recent Labs    07/07/21 0554  NA 139  K 3.7  CL 99  CO2 29  GLUCOSE 99  BUN 15  CREATININE 0.58  CALCIUM 9.6    Studies/Results: CT DBS HEAD W/O CONTRAST (1MM)  Result Date: 07/07/2021 CLINICAL DATA:  Status post craniotomy for deep brain stimulator placement. EXAM: CT HEAD WITHOUT CONTRAST TECHNIQUE: Contiguous axial images were obtained from the base of the skull through the vertex without intravenous contrast. COMPARISON:  06/30/2021 FINDINGS: Brain: Postoperative changes are seen from interval deep brain stimulator placement with electrodes terminating in the subthalamic regions bilaterally. There is small volume pneumocephalus bilaterally primarily over the anterior frontal convexities. No acute infarct, intracranial hemorrhage, mass, midline shift, or sizable extra-axial fluid collection is identified. The ventricles are normal in size. Vascular: Calcified atherosclerosis at the skull base. Skull: Postoperative changes with bilateral frontal burr holes and mild swelling and gas involving the scalp soft tissues with skin staples in place. Sinuses/Orbits: Mild mucosal thickening in the maxillary  sinuses. Clear mastoid air cells. Bilateral cataract extraction. Other: None. IMPRESSION: Postoperative changes from deep brain stimulator placement. Electronically Signed   By: Logan Bores M.D.   On: 07/07/2021 20:28    Assessment/Plan: stable  LOS: 1 day  Ok per Dr. Vertell Limber to d/c to home. Pt verbalizes understanding of d/c instructions. Ok to shower, cautious of scalp staples. OK to leave open to air when no longer draining. Pt states daughter will be able to change dry dressings prn.  Third stage surgery next week for extensions and pulse generator placement.  Dr Vertell Limber will send Tramdol '50mg'$  for prn home use. CT will be repeated prior to her discharge with 8m slices as ordered.   PVerdis Prime8/10/2021, 7:07 AM   Patient is doing well. Discharge home.

## 2021-07-08 NOTE — Progress Notes (Signed)
Patient alert and oriented, voiding adequately, skin clean, dry and intact without evidence of skin break down, or symptoms of complications - no redness or edema noted, only slight tenderness at site.  Patient states pain is manageable at time of discharge. Patient has an appointment with MD

## 2021-07-08 NOTE — Evaluation (Signed)
Physical Therapy Evaluation Patient Details Name: [name] MRN: [id number] DOB: 1945/12/03 Today's Date: 07/08/2021   History of Present Illness  Pt is a 75 y/o female who presents s/p subthalamic deep brain stimulator placement (bilateral). PMH including Anxiety, Arthritis, CAD, Cardiomyopathy, Cataract, CHF, Fibromyalgia, HTN, Lupus, Parkinson's disease ([place]) (08/25/2015), and Takotsubo cardiomyopathy (2008).  Clinical Impression  Patient evaluated by Physical Therapy with no further acute PT needs identified. All education has been completed and the patient has no further questions. Pt was able to demonstrate transfers and ambulation with gross supervision for safety and rollator for support. Pt reports being near baseline of function and will have family present for supervision 24/7 at discharge. Pt was educated on precautions, appropriate activity progression, and car transfer. See below for any follow-up Physical Therapy or equipment needs. PT is signing off. Thank you for this referral.     Follow Up Recommendations No PT follow up;Supervision for mobility/OOB    Equipment Recommendations  Other (comment) (Rollator)    Recommendations for Other Services       Precautions / Restrictions Precautions Precautions: Fall Precaution Comments: Provided handouts for decreasing bending forward and techniques for decreasing headaches. Restrictions Weight Bearing Restrictions: No      Mobility  Bed Mobility Overal bed mobility: Needs Assistance Bed Mobility: Rolling;Sidelying to Sit;Sit to Sidelying Rolling: Supervision Sidelying to sit: Supervision     Sit to sidelying: Supervision      Transfers Overall transfer level: Needs assistance Equipment used: 4-wheeled walker Transfers: Sit to/from Stand Sit to Stand: Supervision         General transfer comment: VC's for hand placement on seated surface for safety.  Ambulation/Gait Ambulation/Gait assistance: Min  guard;Supervision Gait Distance (Feet): 200 Feet Assistive device: 4-wheeled walker Gait Pattern/deviations: Step-through pattern;Decreased stride length;Drifts right/left Gait velocity: Decreased Gait velocity interpretation: <1.31 ft/sec, indicative of household ambulator General Gait Details: Observed pt utilizing RW with OT and trialed rollator during PT session. Pt drifts R and L (more R) during gait, rollator glides easier with her than the RW. No overt LOB noted so feel rollator is safe.  Stairs Stairs: Yes Stairs assistance: Min guard Stair Management: Two rails;Step to pattern;Forwards Number of Stairs: 5 General stair comments: VC's for seqeuncing. No assist required however close hands on guarding provided for optimal safety.  Wheelchair Mobility    Modified Rankin (Stroke Patients Only)       Balance Overall balance assessment: Needs assistance Sitting-balance support: No upper extremity supported;Feet supported Sitting balance-Leahy Scale: Good     Standing balance support: Bilateral upper extremity supported;During functional activity Standing balance-Leahy Scale: Poor                               Pertinent Vitals/Pain Pain Assessment: Faces Faces Pain Scale: Hurts little more Pain Location: head Pain Descriptors / Indicators: Discomfort;Headache Pain Intervention(s): Limited activity within patient's tolerance;Monitored during session;Repositioned    Home Living Family/patient expects to be discharged to:: Private residence Living Arrangements: Children Available Help at Discharge: Family;Available 24 hours/day Type of Home: House Home Access: Stairs to enter Entrance Stairs-Rails: Left;Right;Can reach both Entrance Stairs-Number of Steps: 1 Home Layout: Two level;Able to live on main level with bedroom/bathroom Home Equipment: Bedside commode;Cane - single point;Wheelchair - Insurance claims handler - 2 wheels (Old rollator that pt  reports needs to be replaced)      Prior Function Level of Independence: Needs assistance   Gait / Transfers  Assistance Needed: Uses RW  ADL's / Homemaking Assistance Needed: Supervision for safety. Family will assist as she needs pending "stiffness"        Hand Dominance   Dominant Hand: Right    Extremity/Trunk Assessment   Upper Extremity Assessment Upper Extremity Assessment: Defer to OT evaluation    Lower Extremity Assessment Lower Extremity Assessment: Generalized weakness    Cervical / Trunk Assessment Cervical / Trunk Assessment: Other exceptions Cervical / Trunk Exceptions: s/p brain stimulator; forward head posture with rounded shoulders  Communication   Communication: No difficulties  Cognition Arousal/Alertness: Awake/alert Behavior During Therapy: WFL for tasks assessed/performed Overall Cognitive Status: Within Functional Limits for tasks assessed                                        General Comments General comments (skin integrity, edema, etc.): son present throughout session    Exercises     Assessment/Plan    PT Assessment Patent does not need any further PT services  PT Problem List         PT Treatment Interventions      PT Goals (Current goals can be found in the Care Plan section)  Acute Rehab PT Goals Patient Stated Goal: Go home ASAP PT Goal Formulation: All assessment and education complete, DC therapy    Frequency     Barriers to discharge        Co-evaluation               AM-PAC PT "6 Clicks" Mobility  Outcome Measure Help needed turning from your back to your side while in a flat bed without using bedrails?: None Help needed moving from lying on your back to sitting on the side of a flat bed without using bedrails?: A Little Help needed moving to and from a bed to a chair (including a wheelchair)?: A Little Help needed standing up from a chair using your arms (e.g., wheelchair or bedside  chair)?: A Little Help needed to walk in hospital room?: A Little Help needed climbing 3-5 steps with a railing? : A Little 6 Click Score: 19    End of Session Equipment Utilized During Treatment: Gait belt Activity Tolerance: Patient tolerated treatment well Patient left: in bed;with call bell/phone within reach;with family/visitor present Nurse Communication: Mobility status PT Visit Diagnosis: Unsteadiness on feet (R26.81)    Time: [id number] PT Time Calculation (min) (ACUTE ONLY): 20 min   Charges:   PT Evaluation $PT Eval Low Complexity: 1 Low          Rolinda Roan, PT, DPT Acute Rehabilitation Services Pager: [phone] Office: [phone]   Thelma Comp 07/08/2021, 10:13 AM

## 2021-07-08 NOTE — Plan of Care (Signed)
Patient ready for discharge. 

## 2021-07-08 NOTE — Progress Notes (Addendum)
Subjective: Patient reports  "I feel ok... a little headache overnight"  Objective: Vital signs in last 24 hours: Temp:  [96.7 F (35.9 C)-98.2 F (36.8 C)] 98.2 F (36.8 C) (08/12 0319) Pulse Rate:  [64-88] 88 (08/12 0319) Resp:  [11-20] 16 (08/12 0319) BP: (76-143)/(43-74) 107/66 (08/12 0319) SpO2:  [95 %-100 %] 97 % (08/12 0319)  Intake/Output from previous day: 08/11 0701 - 08/12 0700 In: 500 [I.V.:400; IV Piggyback:100] Out: 645 [Urine:625; Blood:20] Intake/Output this shift: No intake/output data recorded.  Alert, conversant. MAEW. Parkinsonian dyskinesias at baseline. Voice is clear, soft, also baseline. Drsgs intact scalp, changed x2 overnight. Reported bloody drainage.   Lab Results: Recent Labs    07/07/21 0554  WBC 6.0  HGB 12.3  HCT 38.4  PLT 312   BMET Recent Labs    07/07/21 0554  NA 139  K 3.7  CL 99  CO2 29  GLUCOSE 99  BUN 15  CREATININE 0.58  CALCIUM 9.6    Studies/Results: CT DBS HEAD W/O CONTRAST (1MM)  Result Date: 07/07/2021 CLINICAL DATA:  Status post craniotomy for deep brain stimulator placement. EXAM: CT HEAD WITHOUT CONTRAST TECHNIQUE: Contiguous axial images were obtained from the base of the skull through the vertex without intravenous contrast. COMPARISON:  06/30/2021 FINDINGS: Brain: Postoperative changes are seen from interval deep brain stimulator placement with electrodes terminating in the subthalamic regions bilaterally. There is small volume pneumocephalus bilaterally primarily over the anterior frontal convexities. No acute infarct, intracranial hemorrhage, mass, midline shift, or sizable extra-axial fluid collection is identified. The ventricles are normal in size. Vascular: Calcified atherosclerosis at the skull base. Skull: Postoperative changes with bilateral frontal burr holes and mild swelling and gas involving the scalp soft tissues with skin staples in place. Sinuses/Orbits: Mild mucosal thickening in the maxillary  sinuses. Clear mastoid air cells. Bilateral cataract extraction. Other: None. IMPRESSION: Postoperative changes from deep brain stimulator placement. Electronically Signed   By: Logan Bores M.D.   On: 07/07/2021 20:28    Assessment/Plan: stable  LOS: 1 day  Ok per Dr. Vertell Limber to d/c to home. Pt verbalizes understanding of d/c instructions. Ok to shower, cautious of scalp staples. OK to leave open to air when no longer draining. Pt states daughter will be able to change dry dressings prn.  Third stage surgery next week for extensions and pulse generator placement.  Dr Vertell Limber will send Tramdol '50mg'$  for prn home use. CT will be repeated prior to her discharge with 1m slices as ordered.   PVerdis Prime8/10/2021, 7:07 AM   Patient is doing well. Discharge home.

## 2021-07-08 NOTE — Discharge Instructions (Signed)
Wound Care Remove outer dressing after 2-3 days Change dressing as needed Leave incision open to air. You may shower. Do not scrub directly on incision.  Do not put any creams, lotions, or ointments on incision. Activity Walk each and every day, increasing distance each day. No lifting greater than 5 lbs.  Avoid bending, arching, and twisting. No driving for 2 weeks; may ride as a passenger locally.  Diet Resume your normal diet.   Call Your Doctor If Any of These Occur Redness, drainage, or swelling at the wound.  Temperature greater than 101 degrees. Severe pain not relieved by pain medication. Incision starts to come apart. Follow Up Appt Call [phone])  for problems.

## 2021-07-08 NOTE — Discharge Summary (Signed)
Physician Discharge Summary  Patient ID: TOMEKI MANGANO MRN: [id number] DOB/AGE: October 24, 1946 75 y.o.   Admit date: 07/07/2021 Discharge date: 07/08/2021   Admission Diagnoses:Parkinsons Disease    Discharge Diagnoses: Parkinsons Disease s/p Deep brain stimulator placement (Bilateral) with microelectrode recordings   Active Problems:   Parkinson's disease [facility])     Discharged Condition: good   Hospital Course: [name] was admitted for surgery for bilateral deep brain stimulator placement for tx of Parkinsons. Follwing uncomplicated surgery, she recovered nicely and transferred to [facility] for nursing care. She is progressing nicely.   Consults: neurology   Significant Diagnostic Studies: post-op CT   Treatments: surgery: Deep brain stimulator placement (Bilateral) with microelectrode recordings    Discharge Exam: Blood pressure 107/66, pulse 88, temperature 98.2 F (36.8 C), temperature source Oral, resp. rate 16, height 5' 5.5" (1.664 m), weight 70.3 kg, SpO2 97 %. Alert, conversant. MAEW. Parkinsonian dyskinesias at baseline. Voice is clear, soft, at baseline. Drsgs intact scalp, changed x2 overnight. Reported bloody drainage. Mild headache overnight controlled with Tramadol.   Disposition: Discharge to home. Tramadol '50mg'$  1 po q6hrs prn pain. Third stage surgery for placement of extensions and pulse generator next week.     Discharge Instructions     Change dressing (specify)   Complete by: As directed    Dressing change: cranial dressings if bloody drainage   Diet - low sodium heart healthy   Complete by: As directed    Increase activity slowly   Complete by: As directed       Allergies as of 07/08/2021       Reactions   Amitriptyline Other (See Comments)   Sedated next morning   Ciprofloxacin Nausea And Vomiting   Cymbalta [duloxetine Hcl] Other (See Comments)   Worsened depression   Iohexol Other (See Comments)    Code: HIVES, Desc: PT developed 2 hives, followed by  SOB, severe headache post 87cc's Omnipaque 300., Onset Date: [id number]   Lyrica [pregabalin] Other (See Comments)   Tried during hospitalization - unsure effects but unable to tolerate   Imipramine Hcl Rash   Iodine Rash   Lidocaine Hcl Rash   Morphine Sulfate Rash   Neosporin [neomycin-bacitracin Zn-polymyx] Rash, Other (See Comments)   Worsened skin breaking out   Sulfamethoxazole Rash   Tetracyclines & Related Rash        Medication List     STOP taking these medications    cephALEXin 250 MG capsule Commonly known as: KEFLEX       TAKE these medications    acetaminophen 650 MG CR tablet Commonly known as: TYLENOL Take 2 tablets (1,300 mg total) by mouth every 8 (eight) hours as needed for pain.   AMBULATORY NON FORMULARY MEDICATION Lift chair Dx:  G20   Artificial Tears 0.2-0.2-1 % Soln Generic drug: Glycerin-Hypromellose-PEG 400 Place 1 drop into both eyes daily as needed (dry eyes).   Calcium Carbonate-Vitamin D 600-400 MG-UNIT chew tablet Commonly known as: Calcium 600/Vitamin D Chew 1 tablet by mouth daily.   carbidopa-levodopa 50-200 MG tablet Commonly known as: SINEMET CR TAKE 1 TABLET BY MOUTH AT  BEDTIME   cyanocobalamin 1000 MCG/ML injection Commonly known as: (VITAMIN B-12) INJECT 1 ML (1,000 MCG TOTAL) INTO THE MUSCLE EVERY 30 (THIRTY) DAYS.   denosumab 60 MG/ML Sosy injection Commonly known as: PROLIA Inject 60 mg into the skin every 6 (six) months.   fludrocortisone 0.1 MG tablet Commonly known as: FLORINEF Take 1 tablet (0.1 mg total) by mouth daily.  HYDROcodone-acetaminophen 5-325 MG tablet Commonly known as: NORCO/VICODIN Take 1 tablet by mouth every 6 (six) hours as needed for moderate pain.   latanoprost 0.005 % ophthalmic solution Commonly known as: XALATAN Place 1 drop into both eyes at bedtime.   polyethylene glycol 17 g packet Commonly known as: MIRALAX / GLYCOLAX Take 17 g by mouth as needed.   pramipexole 0.5 MG  tablet Commonly known as: MIRAPEX TAKE 1 TABLET BY MOUTH 3  TIMES DAILY   sertraline 100 MG tablet Commonly known as: ZOLOFT TAKE 1 TABLET BY MOUTH  DAILY   traMADol 50 MG tablet Commonly known as: ULTRAM Take 1 tablet (50 mg total) by mouth every 6 (six) hours as needed for moderate pain. What changed: See the new instructions.   traMADol 50 MG tablet Commonly known as: ULTRAM Take 1 tablet (50 mg total) by mouth every 6 (six) hours as needed for moderate pain. What changed: You were already taking a medication with the same name, and this prescription was added. Make sure you understand how and when to take each.   Vitamin D3 25 MCG (1000 UT) Caps Take 1 capsule (1,000 Units total) by mouth daily.       ASK your doctor about these medications    carbidopa-levodopa 25-100 MG tablet Commonly known as: SINEMET IR 2 tablet at 6am/1 tablet at 9:30am/1pm/4:30pm.  May take an extra 1/2 tablet prn evening   clonazePAM 0.5 MG tablet Commonly known as: KLONOPIN TAKE 1 TABLET BY MOUTH TWICE DAILY   clotrimazole-betamethasone cream Commonly known as: LOTRISONE Apply 1 application topically daily.   nystatin-triamcinolone ointment Commonly known as: MYCOLOG Apply 1 application topically 2 (two) times daily. Limit to 10 days use               Discharge Care Instructions  (From admission, onward)           Start     Ordered   07/08/21 0000  Change dressing (specify)       Comments: Dressing change: cranial dressings if bloody drainage   07/08/21 0800             Signed: Peggyann Shoals, MD 07/08/2021, 8:01 AM

## 2021-07-10 ENCOUNTER — Other Ambulatory Visit: Payer: Self-pay | Admitting: Family Medicine

## 2021-07-11 NOTE — Telephone Encounter (Signed)
Last office visit 05/27/2021 for hospital follow up.  Last refilled 06/07/2021 for #60 with no refills.    Next Appt: 07/29/21 for follow up.

## 2021-07-12 ENCOUNTER — Other Ambulatory Visit: Payer: Self-pay | Admitting: Neurology

## 2021-07-12 ENCOUNTER — Encounter (HOSPITAL_COMMUNITY): Payer: Self-pay | Admitting: Neurosurgery

## 2021-07-12 ENCOUNTER — Other Ambulatory Visit: Payer: Self-pay

## 2021-07-12 NOTE — Progress Notes (Signed)
Spoke with pt for pre-op call. Pt had surgery last week. States nothing has changed with her medications, allergies, medical or Surgery history.   Covid test on arrival day of surgery.

## 2021-07-12 NOTE — Telephone Encounter (Signed)
ERx 

## 2021-07-13 NOTE — Anesthesia Preprocedure Evaluation (Addendum)
Anesthesia Evaluation  Patient identified by MRN, date of birth, ID band Patient awake    Reviewed: Allergy & Precautions, H&P , NPO status , Patient's Chart, lab work & pertinent test results  History of Anesthesia Complications (+) PONV  Airway Mallampati: II  TM Distance: >3 FB Neck ROM: Full    Dental no notable dental hx. (+) Edentulous Upper, Edentulous Lower, Dental Advisory Given   Pulmonary neg pulmonary ROS,    Pulmonary exam normal breath sounds clear to auscultation       Cardiovascular Exercise Tolerance: Good + CAD and +CHF   Rhythm:Regular Rate:Normal     Neuro/Psych Anxiety Depression  Neuromuscular disease    GI/Hepatic Neg liver ROS, GERD  ,  Endo/Other  negative endocrine ROS  Renal/GU negative Renal ROS  negative genitourinary   Musculoskeletal  (+) Arthritis , Osteoarthritis,  Fibromyalgia -  Abdominal   Peds  Hematology  (+) Blood dyscrasia, anemia ,   Anesthesia Other Findings   Reproductive/Obstetrics negative OB ROS                            Anesthesia Physical Anesthesia Plan  ASA: 3  Anesthesia Plan: General   Post-op Pain Management:    Induction: Intravenous  PONV Risk Score and Plan: 4 or greater and Ondansetron, Aprepitant and Dexamethasone  Airway Management Planned: Oral ETT  Additional Equipment:   Intra-op Plan:   Post-operative Plan: Extubation in OR  Informed Consent: I have reviewed the patients History and Physical, chart, labs and discussed the procedure including the risks, benefits and alternatives for the proposed anesthesia with the patient or authorized representative who has indicated his/her understanding and acceptance.     Dental advisory given  Plan Discussed with: CRNA  Anesthesia Plan Comments:        Anesthesia Quick Evaluation

## 2021-07-14 ENCOUNTER — Encounter (HOSPITAL_COMMUNITY): Payer: Self-pay | Admitting: Neurosurgery

## 2021-07-14 ENCOUNTER — Ambulatory Visit (HOSPITAL_COMMUNITY): Payer: Medicare Other | Admitting: Certified Registered"

## 2021-07-14 ENCOUNTER — Encounter (HOSPITAL_COMMUNITY)
Admission: RE | Disposition: A | Discharge status: Home / Self Care | Payer: Self-pay | Source: Home / Self Care | Attending: Neurosurgery

## 2021-07-14 ENCOUNTER — Observation Stay (HOSPITAL_COMMUNITY)
Admission: RE | Admit: 2021-07-14 | Discharge: 2021-07-15 | Disposition: A | Discharge status: Home / Self Care | Payer: Medicare Other | Attending: Neurosurgery | Admitting: Neurosurgery

## 2021-07-14 DIAGNOSIS — G20 Parkinson's disease: Secondary | ICD-10-CM | POA: Diagnosis not present

## 2021-07-14 DIAGNOSIS — Z20822 Contact with and (suspected) exposure to covid-19: Secondary | ICD-10-CM | POA: Insufficient documentation

## 2021-07-14 DIAGNOSIS — K219 Gastro-esophageal reflux disease without esophagitis: Secondary | ICD-10-CM | POA: Diagnosis not present

## 2021-07-14 DIAGNOSIS — E785 Hyperlipidemia, unspecified: Secondary | ICD-10-CM | POA: Diagnosis not present

## 2021-07-14 DIAGNOSIS — G3184 Mild cognitive impairment, so stated: Secondary | ICD-10-CM | POA: Diagnosis not present

## 2021-07-14 HISTORY — PX: PULSE GENERATOR IMPLANT: SHX5370

## 2021-07-14 LAB — SARS CORONAVIRUS 2 BY RT PCR (HOSPITAL ORDER, PERFORMED IN ~~LOC~~ HOSPITAL LAB): SARS Coronavirus 2: NEGATIVE

## 2021-07-14 SURGERY — UNILATERAL PULSE GENERATOR IMPLANT
Anesthesia: General | Laterality: Right

## 2021-07-14 MED ORDER — APREPITANT 40 MG PO CAPS: 40.0000 mg | ORAL_CAPSULE | Freq: Once | ORAL | Status: AC

## 2021-07-14 MED ORDER — CARBIDOPA-LEVODOPA 25-100 MG PO TABS
2.0000 | ORAL_TABLET | Freq: Three times a day (TID) | ORAL | Status: DC
  Administered 2021-07-14 (×2): 2 via ORAL
  Filled 2021-07-14 (×4): qty 2

## 2021-07-14 MED ORDER — CARBIDOPA-LEVODOPA 25-100 MG PO TABS
2.0000 | ORAL_TABLET | Freq: Every morning | ORAL | Status: DC
  Administered 2021-07-15: 2 via ORAL
  Filled 2021-07-14: qty 2

## 2021-07-14 MED ORDER — THROMBIN 5000 UNITS EX SOLR
CUTANEOUS | Status: AC
  Filled 2021-07-14: qty 5000

## 2021-07-14 MED ORDER — CLOTRIMAZOLE 1 % EX CREA
TOPICAL_CREAM | Freq: Two times a day (BID) | CUTANEOUS | Status: DC
Start: 2021-07-14 — End: 2021-07-15
  Filled 2021-07-14: qty 15

## 2021-07-14 MED ORDER — LIDOCAINE 2% (20 MG/ML) 5 ML SYRINGE
INTRAMUSCULAR | Status: DC | PRN
  Administered 2021-07-14: 60 mg via INTRAVENOUS

## 2021-07-14 MED ORDER — SERTRALINE HCL 50 MG PO TABS
100.0000 mg | ORAL_TABLET | Freq: Every day | ORAL | Status: DC
  Filled 2021-07-14: qty 2

## 2021-07-14 MED ORDER — HYDROCODONE-ACETAMINOPHEN 5-325 MG PO TABS
1.0000 | ORAL_TABLET | Freq: Four times a day (QID) | ORAL | Status: DC | PRN
Start: 2021-07-14 — End: 2021-07-15

## 2021-07-14 MED ORDER — PANTOPRAZOLE SODIUM 40 MG PO TBEC
40.0000 mg | DELAYED_RELEASE_TABLET | Freq: Every day | ORAL | Status: DC
  Administered 2021-07-14: 40 mg via ORAL
  Filled 2021-07-14: qty 1

## 2021-07-14 MED ORDER — FENTANYL CITRATE (PF) 250 MCG/5ML IJ SOLN
INTRAMUSCULAR | Status: DC | PRN
  Administered 2021-07-14: 25 ug via INTRAVENOUS
  Administered 2021-07-14: 50 ug via INTRAVENOUS
  Administered 2021-07-14: 25 ug via INTRAVENOUS

## 2021-07-14 MED ORDER — ROCURONIUM BROMIDE 10 MG/ML (PF) SYRINGE
PREFILLED_SYRINGE | INTRAVENOUS | Status: AC
  Filled 2021-07-14: qty 10

## 2021-07-14 MED ORDER — ACETAMINOPHEN 500 MG PO TABS: 1000.0000 mg | ORAL_TABLET | Freq: Three times a day (TID) | ORAL | Status: DC | PRN

## 2021-07-14 MED ORDER — PROPOFOL 10 MG/ML IV BOLUS
INTRAVENOUS | Status: DC | PRN
  Administered 2021-07-14: 100 mg via INTRAVENOUS

## 2021-07-14 MED ORDER — ACETAMINOPHEN 325 MG PO TABS: 650.0000 mg | ORAL_TABLET | ORAL | Status: DC | PRN

## 2021-07-14 MED ORDER — SUGAMMADEX SODIUM 200 MG/2ML IV SOLN
INTRAVENOUS | Status: DC | PRN
  Administered 2021-07-14: 200 mg via INTRAVENOUS

## 2021-07-14 MED ORDER — ROCURONIUM BROMIDE 10 MG/ML (PF) SYRINGE
PREFILLED_SYRINGE | INTRAVENOUS | Status: DC | PRN
  Administered 2021-07-14: 40 mg via INTRAVENOUS

## 2021-07-14 MED ORDER — TRAMADOL HCL 50 MG PO TABS: 50.0000 mg | ORAL_TABLET | Freq: Four times a day (QID) | ORAL | Status: DC | PRN

## 2021-07-14 MED ORDER — PHENYLEPHRINE HCL (PRESSORS) 10 MG/ML IV SOLN
INTRAVENOUS | Status: DC | PRN
  Administered 2021-07-14 (×3): 40 ug via INTRAVENOUS

## 2021-07-14 MED ORDER — FENTANYL CITRATE (PF) 100 MCG/2ML IJ SOLN
25.0000 ug | INTRAMUSCULAR | Status: DC | PRN
  Administered 2021-07-14: 50 ug via INTRAVENOUS

## 2021-07-14 MED ORDER — CARBIDOPA-LEVODOPA ER 50-200 MG PO TBCR
1.0000 | EXTENDED_RELEASE_TABLET | Freq: Every day | ORAL | Status: DC
  Administered 2021-07-14: 1 via ORAL
  Filled 2021-07-14: qty 1

## 2021-07-14 MED ORDER — ACETAMINOPHEN 500 MG PO TABS
1000.0000 mg | ORAL_TABLET | Freq: Once | ORAL | Status: AC
  Administered 2021-07-14: 1000 mg via ORAL
  Filled 2021-07-14: qty 2

## 2021-07-14 MED ORDER — CARBIDOPA-LEVODOPA 25-100 MG PO TABS: 1.0000 | ORAL_TABLET | ORAL | Status: DC

## 2021-07-14 MED ORDER — MIDAZOLAM HCL 5 MG/5ML IJ SOLN
INTRAMUSCULAR | Status: DC | PRN
Start: 2021-07-14 — End: 2021-07-14
  Administered 2021-07-14: 1 mg via INTRAVENOUS

## 2021-07-14 MED ORDER — MIDAZOLAM HCL 2 MG/2ML IJ SOLN
INTRAMUSCULAR | Status: AC
  Filled 2021-07-14: qty 2

## 2021-07-14 MED ORDER — FENTANYL CITRATE (PF) 250 MCG/5ML IJ SOLN
INTRAMUSCULAR | Status: AC
  Filled 2021-07-14: qty 5

## 2021-07-14 MED ORDER — FENTANYL CITRATE (PF) 100 MCG/2ML IJ SOLN
INTRAMUSCULAR | Status: AC
  Filled 2021-07-14: qty 2

## 2021-07-14 MED ORDER — CLONAZEPAM 0.5 MG PO TABS
0.5000 mg | ORAL_TABLET | Freq: Two times a day (BID) | ORAL | Status: DC
  Administered 2021-07-14: 0.5 mg via ORAL
  Filled 2021-07-14 (×2): qty 1

## 2021-07-14 MED ORDER — 0.9 % SODIUM CHLORIDE (POUR BTL) OPTIME
TOPICAL | Status: DC | PRN
  Administered 2021-07-14: 1000 mL

## 2021-07-14 MED ORDER — BUPIVACAINE HCL (PF) 0.5 % IJ SOLN
INTRAMUSCULAR | Status: DC | PRN
  Administered 2021-07-14: 5 mL

## 2021-07-14 MED ORDER — SODIUM CHLORIDE 0.9 % IV SOLN: 250.0000 mL | INTRAVENOUS | Status: DC

## 2021-07-14 MED ORDER — HYDROMORPHONE HCL 1 MG/ML IJ SOLN
0.5000 mg | INTRAMUSCULAR | Status: DC | PRN
Start: 2021-07-14 — End: 2021-07-15

## 2021-07-14 MED ORDER — ONDANSETRON HCL 4 MG PO TABS: 4.0000 mg | ORAL_TABLET | Freq: Four times a day (QID) | ORAL | Status: DC | PRN

## 2021-07-14 MED ORDER — PRAMIPEXOLE DIHYDROCHLORIDE 0.25 MG PO TABS
0.5000 mg | ORAL_TABLET | Freq: Three times a day (TID) | ORAL | Status: DC
  Administered 2021-07-14 (×3): 0.5 mg via ORAL
  Filled 2021-07-14 (×3): qty 2

## 2021-07-14 MED ORDER — POLYETHYLENE GLYCOL 3350 17 G PO PACK: 17.0000 g | PACK | Freq: Every day | ORAL | Status: DC | PRN

## 2021-07-14 MED ORDER — ONDANSETRON HCL 4 MG/2ML IJ SOLN: 4.0000 mg | Freq: Four times a day (QID) | INTRAMUSCULAR | Status: DC | PRN

## 2021-07-14 MED ORDER — LIDOCAINE 2% (20 MG/ML) 5 ML SYRINGE
INTRAMUSCULAR | Status: AC
  Filled 2021-07-14: qty 5

## 2021-07-14 MED ORDER — FLUDROCORTISONE ACETATE 0.1 MG PO TABS
0.1000 mg | ORAL_TABLET | Freq: Every day | ORAL | Status: DC
  Filled 2021-07-14 (×2): qty 1

## 2021-07-14 MED ORDER — LATANOPROST 0.005 % OP SOLN
1.0000 [drp] | Freq: Every day | OPHTHALMIC | Status: DC
  Filled 2021-07-14: qty 2.5

## 2021-07-14 MED ORDER — DEXAMETHASONE SODIUM PHOSPHATE 10 MG/ML IJ SOLN
INTRAMUSCULAR | Status: DC | PRN
  Administered 2021-07-14: 5 mg via INTRAVENOUS

## 2021-07-14 MED ORDER — OXYCODONE HCL 5 MG PO TABS: 5.0000 mg | ORAL_TABLET | ORAL | Status: DC | PRN

## 2021-07-14 MED ORDER — BACITRACIN ZINC 500 UNIT/GM EX OINT
TOPICAL_OINTMENT | CUTANEOUS | Status: AC
  Filled 2021-07-14: qty 28.35

## 2021-07-14 MED ORDER — NAPHAZOLINE-PHENIRAMINE 0.025-0.3 % OP SOLN
1.0000 [drp] | Freq: Four times a day (QID) | OPHTHALMIC | Status: DC | PRN
  Filled 2021-07-14: qty 15

## 2021-07-14 MED ORDER — PANTOPRAZOLE SODIUM 40 MG IV SOLR: 40.0000 mg | Freq: Every day | INTRAVENOUS | Status: DC

## 2021-07-14 MED ORDER — LACTATED RINGERS IV SOLN: INTRAVENOUS | Status: DC

## 2021-07-14 MED ORDER — POLYETHYLENE GLYCOL 3350 17 G PO PACK: 17.0000 g | PACK | ORAL | Status: DC | PRN

## 2021-07-14 MED ORDER — ONDANSETRON HCL 4 MG/2ML IJ SOLN
INTRAMUSCULAR | Status: AC
  Filled 2021-07-14: qty 2

## 2021-07-14 MED ORDER — GLYCERIN-HYPROMELLOSE-PEG 400 0.2-0.2-1 % OP SOLN
1.0000 [drp] | Freq: Every day | OPHTHALMIC | Status: DC | PRN
  Filled 2021-07-14: qty 15

## 2021-07-14 MED ORDER — PHENOL 1.4 % MT LIQD: 1.0000 | OROMUCOSAL | Status: DC | PRN

## 2021-07-14 MED ORDER — NYSTATIN-TRIAMCINOLONE 100000-0.1 UNIT/GM-% EX OINT
1.0000 "application " | TOPICAL_OINTMENT | Freq: Two times a day (BID) | CUTANEOUS | Status: DC | PRN
  Filled 2021-07-14: qty 15

## 2021-07-14 MED ORDER — PROPOFOL 10 MG/ML IV BOLUS
INTRAVENOUS | Status: AC
  Filled 2021-07-14: qty 20

## 2021-07-14 MED ORDER — SODIUM CHLORIDE 0.9% FLUSH: 3.0000 mL | Freq: Two times a day (BID) | INTRAVENOUS | Status: DC

## 2021-07-14 MED ORDER — BUPIVACAINE HCL (PF) 0.5 % IJ SOLN
INTRAMUSCULAR | Status: AC
  Filled 2021-07-14: qty 30

## 2021-07-14 MED ORDER — SODIUM CHLORIDE 0.9% FLUSH: 3.0000 mL | INTRAVENOUS | Status: DC | PRN

## 2021-07-14 MED ORDER — PHENYLEPHRINE 40 MCG/ML (10ML) SYRINGE FOR IV PUSH (FOR BLOOD PRESSURE SUPPORT)
PREFILLED_SYRINGE | INTRAVENOUS | Status: AC
  Filled 2021-07-14: qty 10

## 2021-07-14 MED ORDER — KCL IN DEXTROSE-NACL 20-5-0.45 MEQ/L-%-% IV SOLN: INTRAVENOUS | Status: DC

## 2021-07-14 MED ORDER — CHLORHEXIDINE GLUCONATE CLOTH 2 % EX PADS: 6.0000 | MEDICATED_PAD | Freq: Once | CUTANEOUS | Status: DC

## 2021-07-14 MED ORDER — HYDROCODONE-ACETAMINOPHEN 5-325 MG PO TABS
2.0000 | ORAL_TABLET | ORAL | Status: DC | PRN
Start: 2021-07-14 — End: 2021-07-15
  Administered 2021-07-14 – 2021-07-15 (×5): 2 via ORAL
  Filled 2021-07-14 (×5): qty 2

## 2021-07-14 MED ORDER — DEXAMETHASONE SODIUM PHOSPHATE 10 MG/ML IJ SOLN
INTRAMUSCULAR | Status: AC
  Filled 2021-07-14: qty 1

## 2021-07-14 MED ORDER — CEFAZOLIN SODIUM-DEXTROSE 2-4 GM/100ML-% IV SOLN
2.0000 g | INTRAVENOUS | Status: AC
  Administered 2021-07-14: 2 g via INTRAVENOUS
  Filled 2021-07-14: qty 100

## 2021-07-14 MED ORDER — CEFAZOLIN SODIUM-DEXTROSE 2-4 GM/100ML-% IV SOLN
2.0000 g | Freq: Three times a day (TID) | INTRAVENOUS | Status: AC
Start: 2021-07-14 — End: 2021-07-14
  Administered 2021-07-14 (×2): 2 g via INTRAVENOUS
  Filled 2021-07-14 (×2): qty 100

## 2021-07-14 MED ORDER — APREPITANT 40 MG PO CAPS
ORAL_CAPSULE | ORAL | Status: AC
  Administered 2021-07-14: 40 mg via ORAL
  Filled 2021-07-14: qty 1

## 2021-07-14 MED ORDER — ONDANSETRON HCL 4 MG/2ML IJ SOLN
INTRAMUSCULAR | Status: DC | PRN
  Administered 2021-07-14: 4 mg via INTRAVENOUS

## 2021-07-14 MED ORDER — CHLORHEXIDINE GLUCONATE 0.12 % MT SOLN
15.0000 mL | OROMUCOSAL | Status: AC
  Administered 2021-07-14: 15 mL via OROMUCOSAL
  Filled 2021-07-14 (×2): qty 15

## 2021-07-14 MED ORDER — FLEET ENEMA 7-19 GM/118ML RE ENEM: 1.0000 | ENEMA | Freq: Once | RECTAL | Status: DC | PRN

## 2021-07-14 MED ORDER — CALCIUM CARBONATE-VITAMIN D 500-200 MG-UNIT PO TABS
1.0000 | ORAL_TABLET | Freq: Every day | ORAL | Status: DC
  Administered 2021-07-14: 1 via ORAL
  Filled 2021-07-14 (×3): qty 1

## 2021-07-14 MED ORDER — ALUM & MAG HYDROXIDE-SIMETH 200-200-20 MG/5ML PO SUSP: 30.0000 mL | Freq: Four times a day (QID) | ORAL | Status: DC | PRN

## 2021-07-14 MED ORDER — DOCUSATE SODIUM 100 MG PO CAPS
100.0000 mg | ORAL_CAPSULE | Freq: Two times a day (BID) | ORAL | Status: DC
  Administered 2021-07-14 (×2): 100 mg via ORAL
  Filled 2021-07-14 (×2): qty 1

## 2021-07-14 MED ORDER — MENTHOL 3 MG MT LOZG: 1.0000 | LOZENGE | OROMUCOSAL | Status: DC | PRN

## 2021-07-14 MED ORDER — BISACODYL 10 MG RE SUPP: 10.0000 mg | Freq: Every day | RECTAL | Status: DC | PRN

## 2021-07-14 MED ORDER — LIDOCAINE-EPINEPHRINE 1 %-1:100000 IJ SOLN
INTRAMUSCULAR | Status: AC
  Filled 2021-07-14: qty 1

## 2021-07-14 MED ORDER — CYANOCOBALAMIN 1000 MCG/ML IJ SOLN
1000.0000 ug | INTRAMUSCULAR | Status: DC
  Filled 2021-07-14: qty 1

## 2021-07-14 MED ORDER — ACETAMINOPHEN 650 MG RE SUPP: 650.0000 mg | RECTAL | Status: DC | PRN

## 2021-07-14 MED ORDER — LIDOCAINE-EPINEPHRINE 1 %-1:100000 IJ SOLN
INTRAMUSCULAR | Status: DC | PRN
  Administered 2021-07-14: 5 mL

## 2021-07-14 MED ORDER — VITAMIN D 25 MCG (1000 UNIT) PO TABS
1000.0000 [IU] | ORAL_TABLET | Freq: Every day | ORAL | Status: DC
  Administered 2021-07-14: 1000 [IU] via ORAL
  Filled 2021-07-14 (×3): qty 1

## 2021-07-14 SURGICAL SUPPLY — 46 items
BAG COUNTER SPONGE SURGICOUNT (BAG) ×2 IMPLANT
BLADE CLIPPER SURG (BLADE) IMPLANT
BNDG ADH 1X3 SHEER STRL LF (GAUZE/BANDAGES/DRESSINGS) IMPLANT
BOOT SUTURE AID YELLOW STND (SUTURE) IMPLANT
CANISTER SUCT 3000ML PPV (MISCELLANEOUS) ×2 IMPLANT
CHARGING SYSTEM VERCISE (MISCELLANEOUS) ×2
CLIP RANEY DISP (INSTRUMENTS) IMPLANT
COVER BACK TABLE 60X90IN (DRAPES) ×2 IMPLANT
DECANTER SPIKE VIAL GLASS SM (MISCELLANEOUS) ×2 IMPLANT
DERMABOND ADVANCED (GAUZE/BANDAGES/DRESSINGS) ×1
DERMABOND ADVANCED .7 DNX12 (GAUZE/BANDAGES/DRESSINGS) ×1 IMPLANT
DRAPE C-ARM 42X72 X-RAY (DRAPES) IMPLANT
DRAPE CAMERA CLOSED 9X96 (DRAPES) ×2 IMPLANT
DRAPE ORTHO SPLIT 77X108 STRL (DRAPES) ×4
DRAPE SURG ORHT 6 SPLT 77X108 (DRAPES) ×2 IMPLANT
DRSG OPSITE 4X5.5 SM (GAUZE/BANDAGES/DRESSINGS) ×2 IMPLANT
DRSG OPSITE POSTOP 3X4 (GAUZE/BANDAGES/DRESSINGS) ×2 IMPLANT
DRSG TEGADERM 4X4.75 (GAUZE/BANDAGES/DRESSINGS) ×2 IMPLANT
DRSG TELFA 3X8 NADH (GAUZE/BANDAGES/DRESSINGS) ×2 IMPLANT
DURAPREP 26ML APPLICATOR (WOUND CARE) ×2 IMPLANT
GAUZE 4X4 16PLY ~~LOC~~+RFID DBL (SPONGE) ×2 IMPLANT
GAUZE SPONGE 4X4 12PLY STRL (GAUZE/BANDAGES/DRESSINGS) IMPLANT
GLOVE SURG ENC MOIS LTX SZ8 (GLOVE) ×2 IMPLANT
GLOVE SURG LTX SZ8 (GLOVE) ×2 IMPLANT
GLOVE SURG UNDER LTX SZ8 (GLOVE) ×2 IMPLANT
GLOVE SURG UNDER LTX SZ8.5 (GLOVE) ×2 IMPLANT
GLOVE SURG UNDER POLY LF SZ7.5 (GLOVE) ×2 IMPLANT
KIT BASIN OR (CUSTOM PROCEDURE TRAY) ×2 IMPLANT
KIT CONTACT EXTENSION 55CMX8 (Miscellaneous) ×4 IMPLANT
KIT GENUS R16 GENERATOR (Generator) ×2 IMPLANT
KIT REMOTE CONTROL 4 VERCISE (KITS) ×2 IMPLANT
KIT REMOVER STAPLE SKIN (MISCELLANEOUS) ×2 IMPLANT
MARKER PEN SURG W/LABELS BLK (STERILIZATION PRODUCTS) ×2 IMPLANT
MARKER SKIN DUAL TIP RULER LAB (MISCELLANEOUS) ×2 IMPLANT
NEEDLE HYPO 25X1 1.5 SAFETY (NEEDLE) ×2 IMPLANT
NEEDLE SPNL 18GX3.5 QUINCKE PK (NEEDLE) IMPLANT
PACK LAMINECTOMY NEURO (CUSTOM PROCEDURE TRAY) ×2 IMPLANT
PAD ARMBOARD 7.5X6 YLW CONV (MISCELLANEOUS) IMPLANT
PASSER CATH 36 CODMAN DISP (NEUROSURGERY SUPPLIES) ×2 IMPLANT
STAPLER SKIN PROX WIDE 3.9 (STAPLE) ×2 IMPLANT
SUT ETHILON 3 0 PS 1 (SUTURE) IMPLANT
SUT SILK 2 0 PERMA HAND 18 BK (SUTURE) ×4 IMPLANT
SUT VIC AB 2-0 CP2 18 (SUTURE) ×2 IMPLANT
SUT VIC AB 2-0 CT2 18 VCP726D (SUTURE) ×4 IMPLANT
SUT VIC AB 3-0 SH 8-18 (SUTURE) ×2 IMPLANT
SYSTEM CHARGING VERCISE (MISCELLANEOUS) ×1 IMPLANT

## 2021-07-14 NOTE — Brief Op Note (Signed)
07/14/2021  8:41 AM  PATIENT:  [name]  75 y.o. female  PRE-OPERATIVE DIAGNOSIS:  Parkinson's Disease  POST-OPERATIVE DIAGNOSIS:  Parkinson's Disease  PROCEDURE:  Procedure(s): Right chest PULSE GENERATOR IMPLANT for bilateral DBS leads (Right)  SURGEON:  Surgeon(s) and Role:    Erline Levine, MD - Primary  PHYSICIAN ASSISTANT:   ASSISTANTS: Poteat, RN   ANESTHESIA:   general  EBL:  Minimal  BLOOD ADMINISTERED:none  DRAINS: none   LOCAL MEDICATIONS USED:  MARCAINE    and LIDOCAINE   SPECIMEN:  No Specimen  DISPOSITION OF SPECIMEN:  N/A  COUNTS:  YES  TOURNIQUET:  * No tourniquets in log *  DICTATION: DICTATION: Patient has implanted bilateral STN stimulator electrodes having recently completed DBS Stage I and now presents for placement of lead extensions and IPG implantation.  PROCEDURE: Patient was brought to the operating room and GETA anesthesia was induced.  Initial operation was performed on the right and both cranial leads had been tunneled to the right post-auricular scalp.  Right upper chest, scalp, neck were prepped with betadine scrub and Duraprep.  Area of planned incision was infiltrated with lidocaine.  Scalp incision was made and the lead extensions were exposed. An incision was made in the right upper chest and a pocket was created.  Extension tunnel was made from scalp to pocket.   [employer] Vercise Genus IPG was placed and attached to lead extensions, which in turn was connected to cranial leads and torqued appropriately.   The IPG  was placed in the pocket. Impedences were checked.  Battery was anchored with a 2-0 silk stitch.  Wounds were irrigated.   Incisions were closed with 2-0 Vicryl and 3-0 vicryl sutures at the pocket and 2-0 vicryl at the scalp with staples. and dressed with  sterile occlusive dressings.  g.Counts were correct at the end of the case.   PLAN OF CARE: Admit for overnight observation  PATIENT DISPOSITION:  PACU -  hemodynamically stable.   Delay start of Pharmacological VTE agent (>24hrs) due to surgical blood loss or risk of bleeding: yes

## 2021-07-14 NOTE — Transfer of Care (Signed)
Immediate Anesthesia Transfer of Care Note  Patient: [name]  Procedure(s) Performed: UNILATERAL PULSE GENERATOR IMPLANT (Right)  Patient Location: PACU  Anesthesia Type:General  Level of Consciousness: awake, alert , oriented and patient cooperative  Airway & Oxygen Therapy: Patient connected to face mask oxygen  Post-op Assessment: Post -op Vital signs reviewed and stable  Post vital signs: stable  Last Vitals:  Vitals Value Taken Time  BP 161/116 07/14/21 0849  Temp    Pulse 89 07/14/21 0851  Resp 28 07/14/21 0851  SpO2 100 % 07/14/21 0851  Vitals shown include unvalidated device data.  Last Pain:  Vitals:   07/14/21 0605  TempSrc: Oral  PainSc: 0-No pain      Patients Stated Pain Goal: 0 ([id number])  Complications: No notable events documented.

## 2021-07-14 NOTE — Interval H&P Note (Signed)
History and Physical Interval Note:  07/14/2021 7:23 AM  [name]  has presented today for surgery, with the diagnosis of Parkinsons.  The various methods of treatment have been discussed with the patient and family. After consideration of risks, benefits and other options for treatment, the patient has consented to  Procedure(s) with comments: UNILATERAL PULSE GENERATOR IMPLANT (N/A) - rm 21 as a surgical intervention.  The patient's history has been reviewed, patient examined, no change in status, stable for surgery.  I have reviewed the patient's chart and labs.  Questions were answered to the patient's satisfaction.     Peggyann Shoals

## 2021-07-14 NOTE — H&P (Signed)
Erline Levine, MD  Physician  Neurosurgery  H&P Alen Bleacher)     Signed  Date of Service:  06/30/2021  8:00 AM       Related encounter: Admission (Discharged) from 07/07/2021 in [facility]       Signed                 Patient ID:                   [id number] Patient:            [name]                Date of Birth:   1946-04-18 Visit Type:       Office Visit                               Date:    03/30/2021 11:15 AM Provider:          Marchia Meiers. Vertell Limber MD    This 75 year old female presents for Prior to Surgery.   HISTORY OF PRESENT ILLNESS: 1.  Prior to Surgery  Patient returns to discuss DBS for Parkinson's   I met with the patient and her son and we went over her Parkinson's disease status as well as the exact nature of the deep brain stimulator electrode placement surgery.   I answered all their questions and spent 45 minutes in direct consultation with the patient and her son.   I showed the models of the devices and answered specific questions about the nature of the surgery.   The plan is to place fiducials on 06/30/2021.  She will have the deep brain stimulation surgery on 07/07/2021 and she will undergo IPG placement on 07/14/2021.           Medical/Surgical/Interim History Reviewed, no change.  Last detailed document date:10/20/2019.        PAST MEDICAL HISTORY, SURGICAL HISTORY, FAMILY HISTORY, SOCIAL HISTORY AND REVIEW OF SYSTEMS I have reviewed the patient's past medical, surgical, family and social history as well as the comprehensive review of systems as included on the [facility] history form dated 03/30/2021, which I have signed.   Family History: Reviewed, no changes.  Last detailed document date:10/20/2019.    Social History: Reviewed, no changes. Last detailed document date: 10/20/2019.     MEDICATIONS: (added, continued or stopped this  visit) Started Medication Directions Instruction Stopped   alfuzosin ER 10 mg tablet,extended release 24 hr         B-12 Compliance 1,000 mcg/mL injection kit         CALCIUM PETITES         carbidopa ER 25 mg-levodopa 100 mg tablet,extended release         carbidopa ER 50 mg-levodopa 200 mg tablet,extended release         clonazepam 0.5 mg tablet         Dry Eye Relief         Easy Touch 1 mL 25 gauge x 5/8" syringe         gabapentin 100 mg capsule         latanoprost 0.005 % eye drops       11/28/2019 methocarbamol 500 mg tablet take 1 tablet by oral route 4 times every day as needed for muscle spasms       Monoject  Syringe 3 mL 25 x 1 1/4"         Northera 100 mg capsule         Northera 300 mg capsule         polyethylene glycol 3350 17 gram oral powder packet         pramipexole 1 mg tablet         sertraline 100 mg tablet         tramadol 50 mg tablet         triamcinolone acetonide 0.1 % topical cream         VITAMIN C         Vitamin D3  ORAL             ALLERGIES: Ingredient Reaction Medication Name Comment IMIPRAMINE       CIPROFLOXACIN       BACITRACIN   Neosporin (neo-bac-polym)   LIDOCAINE       TETRACYCLINE       BACITRACIN ZINC   Neosporin (neo-bac-polym)   PREGABALIN   Lyrica   SULFA (SULFONAMIDE ANTIBIOTICS)       POLYMYXIN B   Neosporin (neo-bac-polym)   MORPHINE SULFATE       DULOXETINE HCL   Cymbalta   NEOMYCIN SULFATE   Neosporin (neo-bac-polym)   AMITRIPTYLINE       IOHEXOL         Reviewed, no changes.       PHYSICAL EXAM:   Vitals Date Temp F BP Pulse Ht In Wt Lb BMI BSA Pain Score 03/30/2021   106/61 75 60.5 150.2 28.85   7/10         IMPRESSION:   Parkinson's with significant dyskinesias with significant on and off.   PLAN: Proceed with deep brain stimulation for Parkinson's with  bilateral STN DBS.  Risks and benefits of surgery were discussed in detail with the patient and she wishes to proceed with surgery.   Orders: Instruction(s)/Education: Assessment Instruction [phone] Dietary management education, guidance, and counseling   Completed Orders (this encounter) Order Details Reason Side Interpretation Result Initial Treatment Date Region Dietary management education, guidance, and counseling Encouraged patient to eat well balanced diet.               Assessment/Plan   # Detail Type Description  1. Assessment Parkinson disease (G20).        2. Assessment Body mass index (BMI) 28.0-28.9, adult (M76.72).   Plan Orders Today's instructions / counseling include(s) Dietary management education, guidance, and counseling. Clinical information/comments: Encouraged patient to eat well balanced diet.             Pain Management Plan Pain Scale: 7/10. Method: Numeric Pain Intensity Scale. Location: back. Onset: 10/20/2019. Duration: varies. Quality: discomforting. Pain management follow-up plan of care: Patient will continue medication management..                     Provider:  Marchia Meiers. Vertell Limber MD  04/01/2021 06:13 PM       Dictation edited by: Marchia Meiers. Vertell Limber       CC Providers: [place],  [place]  [id number]     Rebecca [name]  [address] [place], [place] [address]                             Electronically signed by Marchia Meiers. Vertell Limber MD  on 04/01/2021 06:13 PM           Note Details  Author Erline Levine, MD File Time 06/30/2021  8:59 AM  Author Type Physician Status Signed  Last Editor Erline Levine, [place] # [id number] Admit Date 07/14/2021

## 2021-07-14 NOTE — Anesthesia Postprocedure Evaluation (Signed)
Anesthesia Post Note  Patient: [name]  Procedure(s) Performed: UNILATERAL PULSE GENERATOR IMPLANT (Right)     Patient location during evaluation: PACU Anesthesia Type: General Level of consciousness: awake and alert Pain management: pain level controlled Vital Signs Assessment: post-procedure vital signs reviewed and stable Respiratory status: spontaneous breathing, nonlabored ventilation, respiratory function stable and patient connected to nasal cannula oxygen Cardiovascular status: blood pressure returned to baseline and stable Postop Assessment: no apparent nausea or vomiting Anesthetic complications: no   No notable events documented.  Last Vitals:  Vitals:   07/14/21 0939 07/14/21 1015  BP:  119/82  Pulse:  77  Resp: 14 20  Temp:  36.8 C  SpO2: 95% 96%    Last Pain:  Vitals:   07/14/21 1015  TempSrc: Oral  PainSc:                  Langford Carias,W. EDMOND

## 2021-07-14 NOTE — Evaluation (Signed)
Physical Therapy Evaluation Patient Details Name: [name] MRN: [id number] DOB: 07-07-1946 Today's Date: 07/14/2021   History of Present Illness  Pt is a 75 y/o female who presents s/p Right chest PULSE GENERATOR IMPLANT for bilateral DBS leads (s/p subthalamic deep brain stimulator placement (bilateral) on 07/07/21). PMH including Anxiety, Arthritis, CAD, Cardiomyopathy, Cataract, CHF, Fibromyalgia, HTN, Lupus, Parkinson's disease ([name]) (08/25/2015), and Takotsubo cardiomyopathy (2008).  Clinical Impression  Patient presents with mobility close to functional baseline.  She will have assistance at home and is eager for completing final stage of this procedure.  Feel no further skilled PT needs at this time.  Depending on progress with stimulator may benefit from outpatient, but pt for follow up with Dr. Carles [name] and will leave to her to recommend if needed.     Follow Up Recommendations No PT follow up;Supervision for mobility/OOB    Equipment Recommendations  None recommended by PT (rollator already on backorder)    Recommendations for Other Services       Precautions / Restrictions Precautions Precautions: Fall      Mobility  Bed Mobility Overal bed mobility: Needs Assistance Bed Mobility: Supine to Sit;Sit to Supine     Supine to sit: Supervision Sit to supine: Supervision   General bed mobility comments: assist for IV, safety    Transfers Overall transfer level: Needs assistance Equipment used: Rolling walker (2 wheeled) Transfers: Sit to/from Stand Sit to Stand: Supervision            Ambulation/Gait Ambulation/Gait assistance: Min guard;Supervision   Assistive device: Rolling walker (2 wheeled) Gait Pattern/deviations: Step-through pattern;Decreased stride length     General Gait Details: increased wrything during ambulation with head and neck  Stairs         General stair comments: declined stair training reports will always have help on  stairs  Wheelchair Mobility    Modified Rankin (Stroke Patients Only)       Balance Overall balance assessment: Needs assistance Sitting-balance support: No upper extremity supported Sitting balance-Leahy Scale: Good     Standing balance support: Bilateral upper extremity supported Standing balance-Leahy Scale: Poor Standing balance comment: UE support for balance                             Pertinent Vitals/Pain Pain Assessment: 0-10 Pain Score: 7  Pain Location: R neck and chest Pain Descriptors / Indicators: Aching Pain Intervention(s): Monitored during session;Repositioned;Patient requesting pain meds-RN notified    Home Living Family/patient expects to be discharged to:: Private residence Living Arrangements: Children Available Help at Discharge: Family;Available 24 hours/day Type of Home: House Home Access: Stairs to enter Entrance Stairs-Rails: Left;Right;Can reach both Entrance Stairs-Number of Steps: 1 Home Layout: Two level;Able to live on main level with bedroom/bathroom (half bath on main floor) Home Equipment: Bedside commode;Cane - single point;Wheelchair - Insurance claims handler - 2 wheels      Prior Function Level of Independence: Needs assistance      ADL's / Homemaking Assistance Needed: sister assisted her up the stairs and with showering with hand held sprayer        Hand Dominance        Extremity/Trunk Assessment        Lower Extremity Assessment Lower Extremity Assessment: RLE deficits/detail;LLE deficits/detail RLE Deficits / Details: AAROM WFL, strength hip flexion 3-/5, knee extension 4-/5, ankle DF 3/5 LLE Deficits / Details: AAROM WFL, strength hip flexion 3+/5, knee extension 4/5, ankle DF 3/5  Cervical / Trunk Assessment Cervical / Trunk Assessment: Other exceptions Cervical / Trunk Exceptions: s/p brain stimulator; forward head posture with rounded shoulders  Communication   Communication: No  difficulties  Cognition Arousal/Alertness: Awake/alert Behavior During Therapy: WFL for tasks assessed/performed Overall Cognitive Status: Within Functional Limits for tasks assessed                                        General Comments General comments (skin integrity, edema, etc.): son present during session    Exercises     Assessment/Plan    PT Assessment Patent does not need any further PT services  PT Problem List         PT Treatment Interventions      PT Goals (Current goals can be found in the Care Plan section)  Acute Rehab PT Goals PT Goal Formulation: All assessment and education complete, DC therapy    Frequency     Barriers to discharge        Co-evaluation               AM-PAC PT "6 Clicks" Mobility  Outcome Measure Help needed turning from your back to your side while in a flat bed without using bedrails?: None Help needed moving from lying on your back to sitting on the side of a flat bed without using bedrails?: None Help needed moving to and from a bed to a chair (including a wheelchair)?: A Little Help needed standing up from a chair using your arms (e.g., wheelchair or bedside chair)?: A Little Help needed to walk in hospital room?: A Little Help needed climbing 3-5 steps with a railing? : A Little 6 Click Score: 20    End of Session   Activity Tolerance: Patient tolerated treatment well Patient left: in bed;with call bell/phone within reach;with family/visitor present   PT Visit Diagnosis: Other abnormalities of gait and mobility (R26.89)    Time: [id number] PT Time Calculation (min) (ACUTE ONLY): 24 min   Charges:   PT Evaluation $PT Eval Low Complexity: 1 Low PT Treatments $Gait Training: 8-22 mins        [name], PT Acute Rehabilitation Services Pager:[phone] Office:[phone] 07/14/2021   [name] 07/14/2021, 3:54 PM

## 2021-07-14 NOTE — Anesthesia Procedure Notes (Signed)
Procedure Name: Intubation Date/Time: 07/14/2021 7:41 AM Performed by: Lavell Luster, CRNA Pre-anesthesia Checklist: Patient identified, Emergency Drugs available, Suction available, Patient being monitored and Timeout performed Patient Re-evaluated:Patient Re-evaluated prior to induction Oxygen Delivery Method: Circle system utilized Preoxygenation: Pre-oxygenation with 100% oxygen Induction Type: IV induction Ventilation: Mask ventilation without difficulty Laryngoscope Size: Mac and 3 Grade View: Grade I Tube type: Oral Tube size: 7.5 mm Number of attempts: 1 Airway Equipment and Method: Stylet Placement Confirmation: ETT inserted through vocal cords under direct vision, positive ETCO2 and breath sounds checked- equal and bilateral Secured at: 23 cm Tube secured with: Tape Dental Injury: Teeth and Oropharynx as per pre-operative assessment

## 2021-07-14 NOTE — Op Note (Signed)
07/14/2021  8:41 AM  PATIENT:  [name]  75 y.o. female  PRE-OPERATIVE DIAGNOSIS:  Parkinson's Disease  POST-OPERATIVE DIAGNOSIS:  Parkinson's Disease  PROCEDURE:  Procedure(s): Right chest PULSE GENERATOR IMPLANT for bilateral DBS leads (Right)  SURGEON:  Surgeon(s) and Role:    Erline Levine, MD - Primary  PHYSICIAN ASSISTANT:   ASSISTANTS: P[place], RN   ANESTHESIA:   general  EBL:  Minimal  BLOOD ADMINISTERED:none  DRAINS: none   LOCAL MEDICATIONS USED:  MARCAINE    and LIDOCAINE   SPECIMEN:  No Specimen  DISPOSITION OF SPECIMEN:  N/A  COUNTS:  YES  TOURNIQUET:  * No tourniquets in log *  DICTATION: DICTATION: Patient has implanted bilateral STN stimulator electrodes having recently completed DBS Stage I and now presents for placement of lead extensions and IPG implantation.  PROCEDURE: Patient was brought to the operating room and GETA anesthesia was induced.  Initial operation was performed on the right and both cranial leads had been tunneled to the right post-auricular scalp.  Right upper chest, scalp, neck were prepped with betadine scrub and Duraprep.  Area of planned incision was infiltrated with lidocaine.  Scalp incision was made and the lead extensions were exposed. An incision was made in the right upper chest and a pocket was created.  Extension tunnel was made from scalp to pocket.   [employer] Vercise Genus IPG was placed and attached to lead extensions, which in turn was connected to cranial leads and torqued appropriately.   The IPG  was placed in the pocket. Impedences were checked.  Battery was anchored with a 2-0 silk stitch.  Wounds were irrigated.   Incisions were closed with 2-0 Vicryl and 3-0 vicryl sutures at the pocket and 2-0 vicryl at the scalp with staples. and dressed with  sterile occlusive dressings.  g.Counts were correct at the end of the case.   PLAN OF CARE: Admit for overnight observation  PATIENT DISPOSITION:  PACU -  hemodynamically stable.   Delay start of Pharmacological VTE agent (>24hrs) due to surgical blood loss or risk of bleeding: yes

## 2021-07-15 ENCOUNTER — Encounter (HOSPITAL_COMMUNITY): Payer: Self-pay | Admitting: Neurosurgery

## 2021-07-15 ENCOUNTER — Other Ambulatory Visit: Payer: Self-pay | Admitting: Neurology

## 2021-07-15 DIAGNOSIS — G20 Parkinson's disease: Secondary | ICD-10-CM | POA: Diagnosis not present

## 2021-07-15 DIAGNOSIS — Z20822 Contact with and (suspected) exposure to covid-19: Secondary | ICD-10-CM | POA: Diagnosis not present

## 2021-07-15 NOTE — Plan of Care (Signed)
Adequately Ready for Discharge 

## 2021-07-15 NOTE — Evaluation (Signed)
Occupational Therapy Evaluation Patient Details Name: [name] MRN: [id number] DOB: 09-19-1946 Today's Date: 07/15/2021    History of Present Illness Pt is a 75 y/o female who presents s/p Right chest PULSE GENERATOR IMPLANT for bilateral DBS leads (s/p subthalamic deep brain stimulator placement (bilateral) on 07/07/21). PMH including Anxiety, Arthritis, CAD, Cardiomyopathy, Cataract, CHF, Fibromyalgia, HTN, Lupus, Parkinson's disease ([place]) (08/25/2015), and Takotsubo cardiomyopathy (2008).   Clinical Impression   Pt admitted for concerns/procedure listed above. PTA pt reported that she was overall mod I with all ADL's and functional mobility, requiring supervision/min guard for bathing. At this time, pt requiring mod I-supervision with all ADL's and functional mobility with RW. Pt has no OT needs at this time and will follow up with Dr. Carles Collet at a later date to discuss further therapy for balance. Acute OT will sign off at this time.     Follow Up Recommendations  No OT follow up    Equipment Recommendations  None recommended by OT    Recommendations for Other Services       Precautions / Restrictions Precautions Precautions: Fall Precaution Comments: Provided handouts for decreasing bending forward and techniques for decreasing headaches. Restrictions Weight Bearing Restrictions: No      Mobility Bed Mobility Overal bed mobility: Modified Independent             General bed mobility comments: No assist needed    Transfers Overall transfer level: Modified independent Equipment used: Rolling walker (2 wheeled) Transfers: Sit to/from Stand Sit to Stand: Modified independent (Device/Increase time)         General transfer comment: No assist needed, pt safe with transfers from bed and toilet with grab bars    Balance Overall balance assessment: Needs assistance Sitting-balance support: No upper extremity supported Sitting balance-Leahy Scale: Good      Standing balance support: Bilateral upper extremity supported Standing balance-Leahy Scale: Fair Standing balance comment: UE support for balance                           ADL either performed or assessed with clinical judgement   ADL Overall ADL's : Modified independent;At baseline                                       General ADL Comments: Pt overall at mod I level with ADL's, some supervision level as well which is her baseline at home, as family assists with stairs, bathing, and overall supervision throughout the day.     Vision Baseline Vision/History: No visual deficits Patient Visual Report: No change from baseline Vision Assessment?: No apparent visual deficits     Perception Perception Perception Tested?: No   Praxis Praxis Praxis tested?: Not tested    Pertinent Vitals/Pain Pain Assessment: No/denies pain     Hand Dominance Right   Extremity/Trunk Assessment Upper Extremity Assessment Upper Extremity Assessment: Overall WFL for tasks assessed (Moderate dyskinsia at rest)   Lower Extremity Assessment Lower Extremity Assessment: Defer to PT evaluation   Cervical / Trunk Assessment Cervical / Trunk Assessment: Other exceptions Cervical / Trunk Exceptions: s/p brain stimulator; forward head posture with rounded shoulders   Communication Communication Communication: No difficulties   Cognition Arousal/Alertness: Awake/alert Behavior During Therapy: WFL for tasks assessed/performed Overall Cognitive Status: Within Functional Limits for tasks assessed  General Comments  Son present during session    Exercises     Shoulder Instructions      Home Living Family/patient expects to be discharged to:: Private residence Living Arrangements: Children Available Help at Discharge: Family;Available 24 hours/day Type of Home: House Home Access: Stairs to enter [employer]  of Steps: 1 Entrance Stairs-Rails: Left;Right;Can reach both Home Layout: Two level;Able to live on main level with bedroom/bathroom Alternate Level Stairs-Number of Steps: full flight   Bathroom Shower/Tub: Teacher, early years/pre: Standard Bathroom Accessibility: Yes How Accessible: Accessible via walker Home Equipment: Bedside commode;Cane - single point;Wheelchair - Insurance claims handler - 2 wheels          Prior Functioning/Environment Level of Independence: Needs assistance  Gait / Transfers Assistance Needed: Uses RW ADL's / Homemaking Assistance Needed: sister assisted her up the stairs and with showering with hand held sprayer            OT Problem List: Decreased activity tolerance;Impaired balance (sitting and/or standing);Decreased knowledge of precautions;Decreased knowledge of use of DME or AE;Pain      OT Treatment/Interventions:      OT Goals(Current goals can be found in the care plan section) Acute Rehab OT Goals Patient Stated Goal: Go home ASAP OT Goal Formulation: All assessment and education complete, DC therapy Time For Goal Achievement: 07/22/21 Potential to Achieve Goals: Good  OT Frequency:     Barriers to D/C:            Co-evaluation              AM-PAC OT "6 Clicks" Daily Activity     Outcome Measure Help from another person eating meals?: None Help from another person taking care of personal grooming?: None Help from another person toileting, which includes using toliet, bedpan, or urinal?: A Little Help from another person bathing (including washing, rinsing, drying)?: A Little Help from another person to put on and taking off regular upper body clothing?: None Help from another person to put on and taking off regular lower body clothing?: None 6 Click Score: 22   End of Session Equipment Utilized During Treatment: Rolling walker Nurse Communication: Mobility status  Activity Tolerance: Patient tolerated  treatment well Patient left: in bed;with call bell/phone within reach  OT Visit Diagnosis: Unsteadiness on feet (R26.81);Other abnormalities of gait and mobility (R26.89);Muscle weakness (generalized) (M62.81)                Time: [id number] OT Time Calculation (min): 16 min Charges:  OT General Charges $OT Visit: 1 Visit OT Evaluation $OT Eval Low Complexity: [place]., OTR/L Acute Rehabilitation  Reef Achterberg Elane Vitaly Wanat 07/15/2021, 9:05 AM

## 2021-07-15 NOTE — Telephone Encounter (Addendum)
Pt ready for scheduling on or after 08/26/21  Out-of-pocket cost due at time of visit: $254.28  Primary: [facility] Medicare [place] co-insurance: 20% Admin fee co-insurance:   Secondary: n/a [place] co-insurance:  Admin fee co-insurance:   Deductible: does not apply  Prior Auth: APPROVED PA# [id number] Valid: 07/20/21-07/20/22

## 2021-07-15 NOTE — Discharge Instructions (Signed)
  Call Your Doctor If Any of These Occur Redness, drainage, or swelling at the wound.  Temperature greater than 101 degrees. Severe pain not relieved by pain medication. Incision starts to come apart. Follow Up Appt Call [phone]) for problems.

## 2021-07-15 NOTE — Discharge Summary (Signed)
Physician Discharge Summary  Patient ID: [name] MRN: [id number] DOB/AGE: 1946-04-21 75 y.o.  Admit date: 07/14/2021 Discharge date: 07/15/2021  Admission Diagnoses: Parkinson's Disease  Discharge Diagnoses: Parkinson's Disease Active Problems:   Parkinson's disease [facility])   Discharged Condition: good  Hospital Course: The patient was admitted on 07/14/2021 and taken to the operating room where the patient underwent Right chest PULSE GENERATOR IMPLANT for bilateral DBS leads. The patient tolerated the procedure well and was taken to the recovery room and then to the floor in stable condition. The hospital course was routine. There were no complications. The wound remained clean dry and intact. Pt had appropriate back soreness. No complaints of leg pain or new N/T/W. The patient remained afebrile with stable vital signs, and tolerated a regular diet. The patient continued to increase activities, and pain was well controlled with oral pain medications.   Consults: None  Significant Diagnostic Studies: None  Treatments: surgery: Right chest PULSE GENERATOR IMPLANT for bilateral DBS leads  Discharge Exam: Blood pressure (!) 130/46, pulse 76, temperature 98.5 F (36.9 C), temperature source Oral, resp. rate 18, height 5' 5.5" (1.664 m), weight 70.3 kg, SpO2 96 %.  Physical Exam: Patient is awake, A/O X 4, conversant, and in good spirits. They are in NAD and VSS. Doing well. Speech is fluent and appropriate. MAEW with good strength. Sensation to light touch is intact. PERLA, EOMI. CNs grossly intact. Significant dyskinesias, face, BUE and BLE. Dressings are clean dry intact. Incisions are well approximated with no drainage, erythema, or edema.  Disposition: Discharge disposition: 01-Home or Self Care        Allergies as of 07/15/2021       Reactions   Amitriptyline Other (See Comments)   Sedated next morning   Ciprofloxacin Nausea And Vomiting   Cymbalta [duloxetine Hcl] Other  (See Comments)   Worsened depression   Iohexol Other (See Comments)    Code: HIVES, Desc: PT developed 2 hives, followed by SOB, severe headache post 87cc's Omnipaque 300., Onset Date: [id number]   Lyrica [pregabalin] Other (See Comments)   Tried during hospitalization - unsure effects but unable to tolerate   Imipramine Hcl Rash   Iodine Rash   Lidocaine Hcl Rash   Morphine Sulfate Rash   Neosporin [neomycin-bacitracin Zn-polymyx] Rash, Other (See Comments)   Worsened skin breaking out   Sulfamethoxazole Rash   Tetracyclines & Related Rash        Medication List     TAKE these medications    acetaminophen 650 MG CR tablet Commonly known as: TYLENOL Take 2 tablets (1,300 mg total) by mouth every 8 (eight) hours as needed for pain.   AMBULATORY NON FORMULARY MEDICATION Lift chair Dx:  G20   Artificial Tears 0.2-0.2-1 % Soln Generic drug: Glycerin-Hypromellose-PEG 400 Place 1 drop into both eyes daily as needed (dry eyes).   Calcium Carbonate-Vitamin D 600-400 MG-UNIT chew tablet Commonly known as: Calcium 600/Vitamin D Chew 1 tablet by mouth daily.   carbidopa-levodopa 25-100 MG tablet Commonly known as: SINEMET IR 2 tablet at 6am/1 tablet at 9:30am/1pm/4:30pm.  May take an extra 1/2 tablet prn evening What changed:  how much to take how to take this when to take this   carbidopa-levodopa 50-200 MG tablet Commonly known as: SINEMET CR TAKE 1 TABLET BY MOUTH AT  BEDTIME What changed: Another medication with the same name was changed. Make sure you understand how and when to take each.   clonazePAM 0.5 MG tablet Commonly known as:  KLONOPIN TAKE 1 TABLET BY MOUTH TWICE DAILY   clotrimazole-betamethasone cream Commonly known as: LOTRISONE Apply 1 application topically daily. What changed:  when to take this reasons to take this   cyanocobalamin 1000 MCG/ML injection Commonly known as: (VITAMIN B-12) INJECT 1 ML (1,000 MCG TOTAL) INTO THE MUSCLE EVERY 30  (THIRTY) DAYS.   denosumab 60 MG/ML Sosy injection Commonly known as: PROLIA Inject 60 mg into the skin every 6 (six) months.   fludrocortisone 0.1 MG tablet Commonly known as: FLORINEF Take 1 tablet (0.1 mg total) by mouth daily.   HYDROcodone-acetaminophen 5-325 MG tablet Commonly known as: NORCO/VICODIN Take 1 tablet by mouth every 6 (six) hours as needed for moderate pain.   latanoprost 0.005 % ophthalmic solution Commonly known as: XALATAN Place 1 drop into both eyes at bedtime.   nystatin-triamcinolone ointment Commonly known as: MYCOLOG Apply 1 application topically 2 (two) times daily. Limit to 10 days use What changed:  when to take this reasons to take this   polyethylene glycol 17 g packet Commonly known as: MIRALAX / GLYCOLAX Take 17 g by mouth as needed.   pramipexole 0.5 MG tablet Commonly known as: MIRAPEX TAKE 1 TABLET BY MOUTH 3  TIMES DAILY   sertraline 100 MG tablet Commonly known as: ZOLOFT TAKE 1 TABLET BY MOUTH  DAILY   traMADol 50 MG tablet Commonly known as: ULTRAM Take 1 tablet (50 mg total) by mouth every 6 (six) hours as needed for moderate pain.   traMADol 50 MG tablet Commonly known as: ULTRAM Take 1 tablet (50 mg total) by mouth every 6 (six) hours as needed for moderate pain.   Vitamin D3 25 MCG (1000 UT) Caps Take 1 capsule (1,000 Units total) by mouth daily.         Signed: Marvis Moeller, DNP, NP-C 07/15/2021, 8:46 AM

## 2021-07-15 NOTE — Progress Notes (Signed)
Subjective: Patient reports that she is doing well and that she would like to be discharged. Her only complaints are of right-sided neck discomfort and a "stinging" type feeling where the battery was implanted. No acute events overnight.   Objective: Vital signs in last 24 hours: Temp:  [97.6 F (36.4 C)-98.5 F (36.9 C)] 98.5 F (36.9 C) (08/19 0752) Pulse Rate:  [67-77] 76 (08/19 0752) Resp:  [14-22] 18 (08/19 0752) BP: (105-161)/(46-116) 130/46 (08/19 0752) SpO2:  [94 %-98 %] 96 % (08/19 0752)  Intake/Output from previous day: 08/18 0701 - 08/19 0700 In: 820 [P.O.:120; I.V.:600; IV Piggyback:100] Out: -  Intake/Output this shift: No intake/output data recorded.  Physical Exam: Patient is awake, A/O X 4, conversant, and in good spirits. They are in NAD and VSS. Doing well. Speech is fluent and appropriate. MAEW with good strength. Sensation to light touch is intact. PERLA, EOMI. CNs grossly intact. Significant dyskinesias, face, BUE and BLE. Dressings are clean dry intact. Incisions are well approximated with no drainage, erythema, or edema.   Lab Results: No results for input(s): WBC, HGB, HCT, PLT in the last 72 hours. BMET No results for input(s): NA, K, CL, CO2, GLUCOSE, BUN, CREATININE, CALCIUM in the last 72 hours.  Studies/Results: No results found.  Assessment/Plan: Patient is post-op day 1 s/p Right chest PULSE GENERATOR IMPLANT for bilateral DBS leads. She is recovering well.  Her only complaint is of right-sided neck discomfort and a "stinging" type feeling where the batterey was implanted. Continue working on pain control, mobility and ambulating patient. Will plan for discharge today.   LOS: 0 days    Marvis Moeller, DNP, NP-C 07/15/2021, 8:01 AM

## 2021-07-15 NOTE — TOC Transition Note (Signed)
Transition of Care [facility]) - CM/SW Discharge Note   Patient Details  Name: TOYE PATZKE MRN: [id number] Date of Birth: [birth year]  [facility] CM/SW Contact:  Tom-Johnson, Renea Ee, RN Phone Number: 07/15/2021, 11:21 AM   Clinical Narrative:    CM consulted for Home Health needs and none noted. No further needs at this time.   Final next level of care: Home/Self Care Barriers to Discharge: No Barriers Identified   Patient Goals and CMS Choice Patient states their goals for this hospitalization and ongoing recovery are:: To go home   Choice offered to / list presented to : NA  Discharge Placement                       Discharge Plan and Services                DME Arranged: N/A DME Agency: NA       HH Arranged: NA HH Agency: NA        Social Determinants of Health (SDOH) Interventions     Readmission Risk Interventions No flowsheet data found.

## 2021-07-18 ENCOUNTER — Ambulatory Visit: Payer: Medicare Other | Admitting: Neurology

## 2021-07-20 ENCOUNTER — Other Ambulatory Visit: Payer: Self-pay | Admitting: Family Medicine

## 2021-07-20 ENCOUNTER — Telehealth: Payer: Self-pay | Admitting: Family Medicine

## 2021-07-20 MED ORDER — CARBIDOPA-LEVODOPA ER 50-200 MG PO TBCR: 1.0000 | EXTENDED_RELEASE_TABLET | Freq: Every day | ORAL | 0 refills | Status: DC

## 2021-07-20 MED ORDER — FLUDROCORTISONE ACETATE 0.1 MG PO TABS: 0.1000 mg | ORAL_TABLET | Freq: Every day | ORAL | 1 refills | Status: DC

## 2021-07-20 MED ORDER — CARBIDOPA-LEVODOPA 25-100 MG PO TABS: ORAL_TABLET | ORAL | 1 refills | Status: DC

## 2021-07-20 NOTE — Telephone Encounter (Signed)
Benefits received. OOP cost $305. PA not needed, reference # [id number]. I called patient and she answered but asked me to hold on, then the call got disconnected.  I called back and left message for call back.

## 2021-07-20 NOTE — Addendum Note (Signed)
Addended by: Alonza Bogus S on: 07/20/2021 12:36 PM   Modules accepted: Orders

## 2021-07-20 NOTE — Telephone Encounter (Signed)
Received refill request from [facility] for fludrocortisone - will sent this in.

## 2021-07-20 NOTE — Chronic Care Management (AMB) (Signed)
  Chronic Care Management   Note  07/20/2021 Name: [name] MRN: [id number] DOB: December 25, 1945  [name] is a 75 y.o. year old female who is a primary care patient of [name], MD. I reached out to [name] by phone today in response to a referral sent by Ms. Fanny Dance PCP, [name], MD.   Ms. Regis was given information about Chronic Care Management services today including:  CCM service includes personalized support from designated clinical staff supervised by her physician, including individualized plan of care and coordination with other care providers 24/7 contact phone numbers for assistance for urgent and routine care needs. Service will only be billed when office clinical staff spend 20 minutes or more in a month to coordinate care. Only one practitioner may furnish and bill the service in a calendar month. The patient may stop CCM services at any time (effective at the end of the month) by phone call to the office staff.   Patient agreed to services and verbal consent obtained.   Follow up plan:   Tatjana Secretary/administrator

## 2021-07-21 ENCOUNTER — Inpatient Hospital Stay (HOSPITAL_COMMUNITY)
Admission: EM | Admit: 2021-07-21 | Discharge: 2021-07-23 | DRG: 092 | Disposition: A | Discharge status: Home / Self Care | Payer: Medicare Other | Attending: Internal Medicine | Admitting: Internal Medicine

## 2021-07-21 ENCOUNTER — Emergency Department (HOSPITAL_COMMUNITY): Payer: Medicare Other

## 2021-07-21 DIAGNOSIS — Z882 Allergy status to sulfonamides status: Secondary | ICD-10-CM

## 2021-07-21 DIAGNOSIS — M797 Fibromyalgia: Secondary | ICD-10-CM | POA: Diagnosis present

## 2021-07-21 DIAGNOSIS — I252 Old myocardial infarction: Secondary | ICD-10-CM

## 2021-07-21 DIAGNOSIS — Z7952 Long term (current) use of systemic steroids: Secondary | ICD-10-CM | POA: Diagnosis not present

## 2021-07-21 DIAGNOSIS — Z79899 Other long term (current) drug therapy: Secondary | ICD-10-CM | POA: Diagnosis not present

## 2021-07-21 DIAGNOSIS — Z888 Allergy status to other drugs, medicaments and biological substances status: Secondary | ICD-10-CM

## 2021-07-21 DIAGNOSIS — Z91041 Radiographic dye allergy status: Secondary | ICD-10-CM

## 2021-07-21 DIAGNOSIS — I2510 Atherosclerotic heart disease of native coronary artery without angina pectoris: Secondary | ICD-10-CM | POA: Diagnosis not present

## 2021-07-21 DIAGNOSIS — G255 Other chorea: Secondary | ICD-10-CM | POA: Diagnosis not present

## 2021-07-21 DIAGNOSIS — M329 Systemic lupus erythematosus, unspecified: Secondary | ICD-10-CM | POA: Diagnosis not present

## 2021-07-21 DIAGNOSIS — Z885 Allergy status to narcotic agent status: Secondary | ICD-10-CM | POA: Diagnosis not present

## 2021-07-21 DIAGNOSIS — M810 Age-related osteoporosis without current pathological fracture: Secondary | ICD-10-CM | POA: Diagnosis not present

## 2021-07-21 DIAGNOSIS — Z79891 Long term (current) use of opiate analgesic: Secondary | ICD-10-CM

## 2021-07-21 DIAGNOSIS — E785 Hyperlipidemia, unspecified: Secondary | ICD-10-CM | POA: Diagnosis not present

## 2021-07-21 DIAGNOSIS — R251 Tremor, unspecified: Secondary | ICD-10-CM

## 2021-07-21 DIAGNOSIS — M6282 Rhabdomyolysis: Secondary | ICD-10-CM | POA: Diagnosis present

## 2021-07-21 DIAGNOSIS — G249 Dystonia, unspecified: Secondary | ICD-10-CM | POA: Diagnosis not present

## 2021-07-21 DIAGNOSIS — Z8249 Family history of ischemic heart disease and other diseases of the circulatory system: Secondary | ICD-10-CM

## 2021-07-21 DIAGNOSIS — Z20822 Contact with and (suspected) exposure to covid-19: Secondary | ICD-10-CM | POA: Diagnosis present

## 2021-07-21 DIAGNOSIS — G20 Parkinson's disease: Secondary | ICD-10-CM | POA: Diagnosis present

## 2021-07-21 DIAGNOSIS — Z881 Allergy status to other antibiotic agents status: Secondary | ICD-10-CM | POA: Diagnosis not present

## 2021-07-21 DIAGNOSIS — G20B1 Parkinson's disease with dyskinesia, without mention of fluctuations: Secondary | ICD-10-CM | POA: Diagnosis present

## 2021-07-21 DIAGNOSIS — G9389 Other specified disorders of brain: Secondary | ICD-10-CM | POA: Diagnosis not present

## 2021-07-21 DIAGNOSIS — I429 Cardiomyopathy, unspecified: Secondary | ICD-10-CM | POA: Diagnosis present

## 2021-07-21 MED ORDER — DIPHENHYDRAMINE HCL 50 MG/ML IJ SOLN
25.0000 mg | Freq: Once | INTRAMUSCULAR | Status: AC
  Administered 2021-07-21: 25 mg via INTRAMUSCULAR
  Filled 2021-07-21: qty 1

## 2021-07-21 MED ORDER — LORAZEPAM 2 MG/ML IJ SOLN
2.0000 mg | INTRAMUSCULAR | Status: DC | PRN
  Administered 2021-07-22: 2 mg via INTRAVENOUS
  Filled 2021-07-21: qty 1

## 2021-07-21 NOTE — ED Notes (Signed)
Neuro at bedside.

## 2021-07-21 NOTE — ED Provider Notes (Signed)
  Provider Note MRN:  [id number]  Arrival date & time: 07/22/21    ED Course and Medical Decision Making  Assumed care from Dr. Ashok Cordia at shift change.  Tremors, recent brain stimulator, neurosurgery without any recommendations, awaiting neurology evaluation.  Dr. Cheral Marker was closely involved in the patient's care, severe dyskinesias treated with large doses of benzodiazepines.  Given the severity of the dyskinesias we were prepared to give even larger doses of benzodiazepines and possibly intubate to control her symptoms.  Patient closely monitored for any airway compromise.  Patient was able to fall asleep after 2 or 3 doses of Versed/Ativan.  Will need continued monitoring and care with neurology following.  Will admit to medicine.  .Critical Care  Date/Time: 07/22/2021 2:29 AM Performed by: Maudie Flakes, MD Authorized by: Maudie Flakes, MD   Critical care provider statement:    Critical care time (minutes):  35   Critical care was necessary to treat or prevent imminent or life-threatening deterioration of the following conditions:  CNS failure or compromise   Critical care was time spent personally by me on the following activities:  Discussions with consultants, evaluation of patient's response to treatment, examination of patient, ordering and performing treatments and interventions, ordering and review of laboratory studies, ordering and review of radiographic studies, pulse oximetry, re-evaluation of patient's condition, obtaining history from patient or surrogate and review of old charts   I assumed direction of critical care for this patient from another provider in my specialty: yes     Care discussed with: admitting provider    Final Clinical Impressions(s) / ED Diagnoses     ICD-10-CM   1. Tremor  R25.1     2. Dyskinesia  G24.9     3. Parkinson disease ([place])  G20     4. Non-traumatic rhabdomyolysis  M62.82       ED Discharge Orders     None       Discharge  Instructions   None     Barth Kirks. [place] Small, [place] [email or web address]    Maudie Flakes, MD 07/22/21 0230

## 2021-07-21 NOTE — ED Notes (Signed)
Pt flailing around in stretcher. Pt unable to calm down, pt stating that she needs help and in pain. MDs at bedside. Pt family at bedside

## 2021-07-21 NOTE — ED Notes (Signed)
Medication administered. Lanny Hurst RN, Lauren NT, and [facility] NT at bedside. Unable to obtain vitals or blood work at this time

## 2021-07-21 NOTE — ED Notes (Signed)
This RN tried x2 to attempt IV access unsuccessful. Unable to obtain vitals. MD Florene Glen notified - MD Florene Glen notified this RN that lab work is ok to wait right now. Neurosurgery to come and see patient.

## 2021-07-21 NOTE — ED Provider Notes (Signed)
[facility] EMERGENCY DEPARTMENT Provider Note   CSN: [id number] Arrival date & time: 07/21/21  2137   History Chief Complaint  Patient presents with   Tremors   Brain stimulator problem    [name] is a 75 y.o. female.  HPI  75 year old female with a past medical history of Parkinson's disease on Sinemet and recent deep brain stimulator placement presenting to the emergency department for increased tremors and abnormal movements.  History largely provided by EMS and son.  Son reports that the patient has had a 3 stage procedure to have a deep brain stimulator placed.  She completed the most recent impulse generator replacement on 8/19 and has done overall well since that time.  He states that the deep brain stimulator has not even been turned on by her neurologist yet.  She was doing well until around 4 hours prior to arrival, when she started experiencing increased tremors and began gesticulating wildly.  Son reports that he gave her an extra dose of 25/100 carbidopa levodopa that he was told he could give as needed, but it did not improve her symptoms.  Speak with the patient, she states that she has been adherent with her Sinemet regimen at home and has not missed any doses nor taken any extra doses.  Son denies the patient having any recently decreased p.o. intake, nausea, vomiting, fever, or diarrhea.  Past Medical History:  Diagnosis Date   Allergy    ANEMIA-NOS 09/25/2007   Anxiety    Arthritis    B12 DEFICIENCY 05/03/2007   Blood transfusion without reported diagnosis    CAD (coronary artery disease) 01/2010   MI, Nishan   Cardiomyopathy ([place]) 02/08/2010   H/o this 2012 after urosepsis, no recurrence.    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   OSTEOPOROSIS 08/2009   bisphosphonate on hold 2/2 dysphagia, on reclast done in August each year   Parkinson's disease ([place]) 08/25/2015   Dx Dr [name] 07/2015    PONV (postoperative nausea and vomiting)    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   Takotsubo cardiomyopathy 2008   due to E coli urosepsis    Patient Active Problem List   Diagnosis Date Noted   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[place]) 05/28/2021   Closed fracture of rib of right side with routine healing 05/28/2021   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[place]) 02/08/2021   Cough 12/20/2020   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   Left shoulder pain 06/19/2020   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   Degenerative lumbar spinal stenosis 11/27/2019   Knee pain, bilateral 08/06/2018   Constipation 08/06/2018   Right lumbar radiculopathy 04/10/2018   Dysphagia 10/09/2017   Chronic cough 07/16/2017   GAD (generalized anxiety disorder) 04/28/2016   Chronic insomnia 03/07/2016   Left Achilles tendinitis 12/08/2015   Parkinson's disease ([place])  08/25/2015   Advanced care planning/counseling discussion 05/26/2015   Family circumstance 02/23/2015   Health maintenance examination 05/21/2014   MDD (major depressive disorder), single episode, moderate ([place]) 11/12/2013   DDD (degenerative disc disease), lumbar 05/31/2013   Medicare annual wellness visit, subsequent 05/20/2013   HLD (hyperlipidemia) 05/20/2013   Vitamin D deficiency 05/09/2013   Recurrent UTI 01/03/2011   Rash and other nonspecific skin eruption 05/11/2010   GERD 02/22/2010   CAD (coronary  artery disease) 01/25/2010   Osteoporosis 11/10/2009   Orthostatic hypotension 12/06/2008   Anemia 09/25/2007   Vitamin B12 deficiency 05/03/2007   Fibromyalgia 05/03/2007    Past Surgical History:  Procedure Laterality Date   ABDOMINAL HYSTERECTOMY  1970s   IUD infection - first partial then with oophorectomy (cysts), complication - low blood pressure   CATARACT EXTRACTION Bilateral    CHOLECYSTECTOMY     complication - low blood pressure   COLONOSCOPY  06/2008   h/o polyps but latest WNL, rec rpt 10 yrs Olevia Perches)   COLONOSCOPY  11/2018   multiple TAs (10 polyps total), rpt 2 yrs Fuller Plan)   CYSTOSCOPY  12/2013   abx treatment for recurrent cystitis   DEXA  04/2013   T -2.9 @ femur, -1.6 @ spine   DEXA  04/2017   T -2.9 hip, -0.7 spine   ESOPHAGOGASTRODUODENOSCOPY  12/2017   WNL, regardless esophagus dilated, small HH Fuller Plan)   EYE SURGERY     LUMBAR LAMINECTOMY/DECOMPRESSION MICRODISCECTOMY Right 11/27/2019   Right Lumbar Four-Five foraminotomy;  Erline Levine, MD)   MINOR PLACEMENT OF FIDUCIAL N/A 06/30/2021   Procedure: MINOR PLACEMENT OF FIDUCIAL;  Surgeon: Erline Levine, MD;  Location: [place];  Service: Neurosurgery;  Laterality: N/A;  Minor room   PTOSIS REPAIR Bilateral 10/2020   Plastic Surgery   PULSE GENERATOR IMPLANT Right 07/14/2021   Procedure: UNILATERAL PULSE GENERATOR IMPLANT;  Surgeon: Erline Levine, MD;  Location: [place];  Service: Neurosurgery;  Laterality: Right;   SUBTHALAMIC STIMULATOR INSERTION Bilateral 07/07/2021   Procedure: Deep brain stimulator placement;  Surgeon: Erline Levine, MD;  Location: [place];  Service: Neurosurgery;  Laterality: Bilateral;   TUBAL LIGATION     UPPER GASTROINTESTINAL ENDOSCOPY       OB History   No obstetric history on file.     Family History  Problem Relation Age of Onset   Heart attack Father    Diabetes Father    Prostate cancer Father    Esophageal cancer Mother    Lung cancer Brother    Breast cancer Sister     Ovarian cancer Sister    Lung cancer Brother    CAD Brother    Uterine cancer Sister    Clotting disorder Son    Healthy Son    Healthy Daughter    Healthy Daughter    Colon cancer Neg Hx    Rectal cancer Neg Hx    Stomach cancer Neg Hx     Social History   Tobacco Use   Smoking status: Never   Smokeless tobacco: Never  Vaping Use   Vaping Use: Never used  Substance Use Topics   Alcohol use: No   Drug use: Not Currently    Types: Nitrous oxide    Home Medications Prior to Admission medications   Medication Sig Start Date End Date Taking? Authorizing Provider  acetaminophen (TYLENOL) 650 MG CR tablet Take 2 tablets (1,300 mg total) by mouth every 8 (eight) hours as needed for pain. 05/17/21   Girguis,  Shanon Brow, MD  AMBULATORY NON [facility] MEDICATION Lift chair Dx:  [name] 09/29/20   TatEustace Quail, DO  Calcium Carbonate-Vitamin D (CALCIUM 600/VITAMIN D) 600-400 MG-UNIT chew tablet Chew 1 tablet by mouth daily. 12/10/17   [name], MD  carbidopa-levodopa (SINEMET CR) 50-200 MG tablet Take 1 tablet by mouth at bedtime. 07/20/21   [name], Eustace Quail, DO  carbidopa-levodopa (SINEMET IR) 25-100 MG tablet TAKE 2 TABLETS BY MOUTH AT 6 AM ,1 TABLET AT 9:30 AM, 1 PM AND 4:30 PM. MAY TAKE AN EXTRA ONE-HALF TABLET BY MOUTH AS NEEDED IN THE EVENING 07/20/21   [name], Eustace Quail, DO  Cholecalciferol (VITAMIN D3) 25 MCG (1000 UT) CAPS Take 1 capsule (1,000 Units total) by mouth daily. 10/10/18   [name], MD  clonazePAM (KLONOPIN) 0.5 MG tablet TAKE 1 TABLET BY MOUTH TWICE DAILY 07/12/21   [name], MD  clotrimazole-betamethasone (LOTRISONE) cream Apply 1 application topically daily. Patient taking differently: Apply 1 application topically daily as needed (irritation). 06/07/21   [name], MD  cyanocobalamin (,VITAMIN B-12,) 1000 MCG/ML injection INJECT 1 ML (1,000 MCG TOTAL) INTO THE MUSCLE EVERY 30 (THIRTY) DAYS. 10/07/19   [name], MD  denosumab (PROLIA) 60  MG/ML SOSY injection Inject 60 mg into the skin every 6 (six) months.    [provider]  fludrocortisone (FLORINEF) 0.1 MG tablet Take 1 tablet (0.1 mg total) by mouth daily. 07/20/21   [name], MD  Glycerin-Hypromellose-PEG 400 (ARTIFICIAL TEARS) 0.2-0.2-1 % SOLN Place 1 drop into both eyes daily as needed (dry eyes).    [provider]  HYDROcodone-acetaminophen (NORCO/VICODIN) 5-325 MG tablet Take 1 tablet by mouth every 6 (six) hours as needed for moderate pain.    [provider]  latanoprost (XALATAN) 0.005 % ophthalmic solution Place 1 drop into both eyes at bedtime. 03/22/21   [provider]  nystatin-triamcinolone ointment (MYCOLOG) Apply 1 application topically 2 (two) times daily. Limit to 10 days use Patient taking differently: Apply 1 application topically 2 (two) times daily as needed (irritation). Limit to 10 days use 05/27/21   [name], MD  polyethylene glycol [facility] / Floria Raveling) packet Take 17 g by mouth as needed. 04/19/18   [name], MD  pramipexole (MIRAPEX) 0.5 MG tablet TAKE 1 TABLET BY MOUTH 3  TIMES DAILY Patient taking differently: Take 0.5 mg by mouth 3 (three) times daily. 05/31/21   [name], Eustace Quail, DO  sertraline (ZOLOFT) 100 MG tablet TAKE 1 TABLET BY MOUTH  DAILY 07/05/21   [name], MD  traMADol (ULTRAM) 50 MG tablet Take 1 tablet (50 mg total) by mouth every 6 (six) hours as needed for moderate pain. 07/08/21   Erline Levine, MD  traMADol (ULTRAM) 50 MG tablet Take 1 tablet (50 mg total) by mouth every 6 (six) hours as needed for moderate pain. 07/08/21   Erline Levine, MD   Allergies    Amitriptyline, Ciprofloxacin, Cymbalta [duloxetine hcl], Iohexol, Lyrica [pregabalin], Imipramine hcl, Iodine, Lidocaine hcl, Morphine sulfate, Neosporin [neomycin-bacitracin zn-polymyx], Sulfamethoxazole, and Tetracyclines & related  Review of Systems   Review of Systems  Constitutional:  Negative for chills and  fever.  HENT:  Negative for ear pain and sore throat.   Eyes:  Negative for pain and visual disturbance.  Respiratory:  Negative for cough and shortness of breath.   Cardiovascular:  Negative for chest pain and palpitations.  Gastrointestinal:  Negative for abdominal pain and vomiting.  Genitourinary:  Negative for dysuria and hematuria.  Musculoskeletal:  Negative for arthralgias and back pain.  Skin:  Negative for color change and rash.  Neurological:  Positive for tremors. Negative for seizures and syncope.  All other systems reviewed and are negative.  Physical Exam Updated Vital Signs BP 138/72   Physical Exam Vitals and nursing note reviewed.  Constitutional:      General: She is not in acute distress.    Appearance: She is well-developed.  HENT:     Head: Normocephalic.     Comments: Staples in place to her scalp.  Wounds appear well approximated, mild bloody and serous drainage from the most posterior wound. Eyes:     Conjunctiva/sclera: Conjunctivae normal.  Cardiovascular:     Rate and Rhythm: Normal rate and regular rhythm.     Heart sounds: No murmur heard. Pulmonary:     Effort: Pulmonary effort is normal. No respiratory distress.     Breath sounds: Normal breath sounds.  Abdominal:     Palpations: Abdomen is soft.     Tenderness: There is no abdominal tenderness.  Musculoskeletal:     Cervical back: Neck supple.  Skin:    General: Skin is warm and dry.     Capillary Refill: Capillary refill takes less than 2 seconds.  Neurological:     Mental Status: She is alert and oriented to person, place, and time.     Comments: Patient is able to tell me her name and the year.  Patient has full strength in all 4 extremities.  She is unseasonably moving her upper and lower extremities bilaterally.  She is also making stereotyped facial movements, opening and closing her mouth as well as extruding and retracting her tongue.  No clonus in the bilateral lower extremities.     ED Results / Procedures / Treatments   Labs (all labs ordered are listed, but only abnormal results are displayed) Labs Reviewed  CBC WITH DIFFERENTIAL/PLATELET  COMPREHENSIVE METABOLIC PANEL  CK   EKG None  Radiology No results found.  Procedures Procedures   Medications Ordered in ED Medications  LORazepam (ATIVAN) injection 2 mg (has no administration in time range)  diphenhydrAMINE (BENADRYL) injection 25 mg (25 mg Intramuscular Given 07/21/21 2154)   ED Course  I have reviewed the triage vital signs and the nursing notes.  Pertinent labs & imaging results that were available during my care of the patient were reviewed by me and considered in my medical decision making (see chart for details).    MDM Rules/Calculators/A&P                           75 year old female with Parkinson's disease and recent DBS placement presenting with increased tremors and abnormal movements.  Symptoms present for 4 hours.  Although she had a recent surgical procedure, the DBS has not been activated.  Son and patient both deny any recent infectious symptoms.  They deny any recent head trauma.  Although she is making abnormal movements, she will also attempt purposeful movements such as touching my finger therefore do not believe she is experiencing seizure activity.  We will obtain a CT head to evaluate for any intracranial trauma from DBS placement such as a new bleed that could be causing her symptoms.  We will obtain a CBC and labs including a CK.  I spoke with the neurosurgeon on-call.  They state that given the time that has elapsed since placement of the DBS, the fact has not been activated, and the  fact that she is asymptomatic to 4 hours ago they find it very unlikely that the DBS is responsible for her symptoms.  Neurology consulted for further evaluations and recommendations.  They are concerned that the patient may have taken extra Sinemet and the extra dose provided by son could have  made things worse.  We will give intramuscular Benadryl and provide a safe environment while the patient metabolizes sentiment in the neurology team comes and evaluates her bedside.  We will obtain a CT head when it is safe to do so.  Patient care signed out to oncoming physician.  Please see their note for the remainder of the patient's ED care and ultimate disposition. Final Clinical Impression(s) / ED Diagnoses Final diagnoses:  Tremor    Rx / DC Orders ED Discharge Orders     None        Claud Kelp, MD 07/21/21 [facility] [place]eding    Lajean Saver, MD 07/23/21 [phone]

## 2021-07-21 NOTE — ED Triage Notes (Addendum)
Pt BIB [employer] from home. Pt has hx of parkinsons and had a stimulator placed last week on the R side + some surgery on her brain - not specified by ems or family.  Around 3 hours ago pt began having tremors + restlessness. Pt family called here and they told pt to come to ED. Pt restless in bed, flailing around in stretcher. EMS gave 5 of versed IM. Pt calmed down some. Pt now restless again. Staples on side of head starting to bleed some.  EMS unable to get full vitals. BP 120 palpated.

## 2021-07-21 NOTE — ED Notes (Signed)
Seizure pads placed on stretcher

## 2021-07-22 ENCOUNTER — Encounter (HOSPITAL_COMMUNITY): Payer: Self-pay | Admitting: Internal Medicine

## 2021-07-22 ENCOUNTER — Emergency Department (HOSPITAL_COMMUNITY): Payer: Medicare Other

## 2021-07-22 ENCOUNTER — Telehealth: Payer: Self-pay | Admitting: Neurology

## 2021-07-22 DIAGNOSIS — Z79899 Other long term (current) drug therapy: Secondary | ICD-10-CM | POA: Diagnosis not present

## 2021-07-22 DIAGNOSIS — E785 Hyperlipidemia, unspecified: Secondary | ICD-10-CM | POA: Diagnosis present

## 2021-07-22 DIAGNOSIS — G249 Dystonia, unspecified: Secondary | ICD-10-CM | POA: Diagnosis present

## 2021-07-22 DIAGNOSIS — M797 Fibromyalgia: Secondary | ICD-10-CM | POA: Diagnosis present

## 2021-07-22 DIAGNOSIS — M6282 Rhabdomyolysis: Secondary | ICD-10-CM | POA: Diagnosis present

## 2021-07-22 DIAGNOSIS — G255 Other chorea: Secondary | ICD-10-CM | POA: Diagnosis present

## 2021-07-22 DIAGNOSIS — I252 Old myocardial infarction: Secondary | ICD-10-CM | POA: Diagnosis not present

## 2021-07-22 DIAGNOSIS — Z91041 Radiographic dye allergy status: Secondary | ICD-10-CM | POA: Diagnosis not present

## 2021-07-22 DIAGNOSIS — Z882 Allergy status to sulfonamides status: Secondary | ICD-10-CM | POA: Diagnosis not present

## 2021-07-22 DIAGNOSIS — Z881 Allergy status to other antibiotic agents status: Secondary | ICD-10-CM | POA: Diagnosis not present

## 2021-07-22 DIAGNOSIS — Z7952 Long term (current) use of systemic steroids: Secondary | ICD-10-CM | POA: Diagnosis not present

## 2021-07-22 DIAGNOSIS — M810 Age-related osteoporosis without current pathological fracture: Secondary | ICD-10-CM | POA: Diagnosis present

## 2021-07-22 DIAGNOSIS — I429 Cardiomyopathy, unspecified: Secondary | ICD-10-CM | POA: Diagnosis present

## 2021-07-22 DIAGNOSIS — I2510 Atherosclerotic heart disease of native coronary artery without angina pectoris: Secondary | ICD-10-CM | POA: Diagnosis present

## 2021-07-22 DIAGNOSIS — G20 Parkinson's disease: Secondary | ICD-10-CM | POA: Diagnosis present

## 2021-07-22 DIAGNOSIS — Z79891 Long term (current) use of opiate analgesic: Secondary | ICD-10-CM | POA: Diagnosis not present

## 2021-07-22 DIAGNOSIS — Z20822 Contact with and (suspected) exposure to covid-19: Secondary | ICD-10-CM | POA: Diagnosis present

## 2021-07-22 DIAGNOSIS — Z8249 Family history of ischemic heart disease and other diseases of the circulatory system: Secondary | ICD-10-CM | POA: Diagnosis not present

## 2021-07-22 DIAGNOSIS — M329 Systemic lupus erythematosus, unspecified: Secondary | ICD-10-CM | POA: Diagnosis present

## 2021-07-22 DIAGNOSIS — Z885 Allergy status to narcotic agent status: Secondary | ICD-10-CM | POA: Diagnosis not present

## 2021-07-22 DIAGNOSIS — Z888 Allergy status to other drugs, medicaments and biological substances status: Secondary | ICD-10-CM | POA: Diagnosis not present

## 2021-07-22 LAB — RESP PANEL BY RT-PCR (FLU A&B, COVID) ARPGX2
Influenza A by PCR: NEGATIVE
Influenza B by PCR: NEGATIVE
SARS Coronavirus 2 by RT PCR: NEGATIVE

## 2021-07-22 LAB — CBC WITH DIFFERENTIAL/PLATELET
Abs Immature Granulocytes: 0.04 10*3/uL (ref 0.00–0.07)
Basophils Absolute: 0.1 10*3/uL (ref 0.0–0.1)
Basophils Relative: 1 %
Eosinophils Absolute: 0.2 10*3/uL (ref 0.0–0.5)
Eosinophils Relative: 2 %
HCT: 36.1 % (ref 36.0–46.0)
Hemoglobin: 11.1 g/dL — ABNORMAL LOW (ref 12.0–15.0)
Immature Granulocytes: 0 %
Lymphocytes Relative: 20 %
Lymphs Abs: 2.4 10*3/uL (ref 0.7–4.0)
MCH: 28 pg (ref 26.0–34.0)
MCHC: 30.7 g/dL (ref 30.0–36.0)
MCV: 90.9 fL (ref 80.0–100.0)
Monocytes Absolute: 1.1 10*3/uL — ABNORMAL HIGH (ref 0.1–1.0)
Monocytes Relative: 9 %
Neutro Abs: 8.5 10*3/uL — ABNORMAL HIGH (ref 1.7–7.7)
Neutrophils Relative %: 68 %
Platelets: 404 10*3/uL — ABNORMAL HIGH (ref 150–400)
RBC: 3.97 MIL/uL (ref 3.87–5.11)
RDW: 14.6 % (ref 11.5–15.5)
WBC: 12.3 10*3/uL — ABNORMAL HIGH (ref 4.0–10.5)
nRBC: 0 % (ref 0.0–0.2)

## 2021-07-22 LAB — COMPREHENSIVE METABOLIC PANEL
ALT: <5 U/L (ref 0–44)
ALT: 6 U/L (ref 0–44)
AST: 27 U/L (ref 15–41)
AST: 31 U/L (ref 15–41)
Albumin: 3.9 g/dL (ref 3.5–5.0)
Albumin: 3.8 g/dL (ref 3.5–5.0)
Alkaline Phosphatase: 65 U/L (ref 38–126)
Alkaline Phosphatase: 70 U/L (ref 38–126)
Anion gap: 11 (ref 5–15)
Anion gap: 10 (ref 5–15)
BUN: 24 mg/dL — ABNORMAL HIGH (ref 8–23)
BUN: 21 mg/dL (ref 8–23)
CO2: 24 mmol/L (ref 22–32)
CO2: 24 mmol/L (ref 22–32)
Calcium: 9.5 mg/dL (ref 8.9–10.3)
Calcium: 9.4 mg/dL (ref 8.9–10.3)
Chloride: 105 mmol/L (ref 98–111)
Chloride: 108 mmol/L (ref 98–111)
Creatinine, Ser: 0.71 mg/dL (ref 0.44–1.00)
Creatinine, Ser: 0.68 mg/dL (ref 0.44–1.00)
GFR, Estimated: >60 mL/min (ref >60)
GFR, Estimated: >60 mL/min (ref >60)
Glucose, Bld: 89 mg/dL (ref 70–99)
Glucose, Bld: 100 mg/dL — ABNORMAL HIGH (ref 70–99)
Potassium: 4.2 mmol/L (ref 3.5–5.1)
Potassium: 3.4 mmol/L — ABNORMAL LOW (ref 3.5–5.1)
Sodium: 140 mmol/L (ref 135–145)
Sodium: 142 mmol/L (ref 135–145)
Total Bilirubin: 1.4 mg/dL — ABNORMAL HIGH (ref 0.3–1.2)
Total Bilirubin: 0.6 mg/dL (ref 0.3–1.2)
Total Protein: 6.5 g/dL (ref 6.5–8.1)
Total Protein: 7 g/dL (ref 6.5–8.1)

## 2021-07-22 LAB — CBC
HCT: 38.9 % (ref 36.0–46.0)
Hemoglobin: 11.9 g/dL — ABNORMAL LOW (ref 12.0–15.0)
MCH: 27.5 pg (ref 26.0–34.0)
MCHC: 30.6 g/dL (ref 30.0–36.0)
MCV: 90 fL (ref 80.0–100.0)
Platelets: 399 10*3/uL (ref 150–400)
RBC: 4.32 MIL/uL (ref 3.87–5.11)
RDW: 14.9 % (ref 11.5–15.5)
WBC: 12.3 10*3/uL — ABNORMAL HIGH (ref 4.0–10.5)
nRBC: 0 % (ref 0.0–0.2)

## 2021-07-22 LAB — CK
Total CK: 530 U/L — ABNORMAL HIGH (ref 38–234)
Total CK: 1464 U/L — ABNORMAL HIGH (ref 38–234)
Total CK: 843 U/L — ABNORMAL HIGH (ref 38–234)

## 2021-07-22 MED ORDER — SODIUM CHLORIDE 0.9 % IV SOLN: INTRAVENOUS | Status: DC

## 2021-07-22 MED ORDER — CARBIDOPA-LEVODOPA ER 50-200 MG PO TBCR
0.5000 | EXTENDED_RELEASE_TABLET | Freq: Every day | ORAL | Status: DC
  Administered 2021-07-22: 0.5 via ORAL
  Filled 2021-07-22 (×2): qty 0.5

## 2021-07-22 MED ORDER — CARBIDOPA-LEVODOPA 25-100 MG PO TABS
1.0000 | ORAL_TABLET | ORAL | Status: DC
  Administered 2021-07-22: 1 via ORAL
  Filled 2021-07-22: qty 1

## 2021-07-22 MED ORDER — ENOXAPARIN SODIUM 40 MG/0.4ML IJ SOSY
40.0000 mg | PREFILLED_SYRINGE | INTRAMUSCULAR | Status: DC
  Administered 2021-07-22 – 2021-07-23 (×2): 40 mg via SUBCUTANEOUS
  Filled 2021-07-22 (×2): qty 0.4

## 2021-07-22 MED ORDER — LATANOPROST 0.005 % OP SOLN
1.0000 [drp] | Freq: Every day | OPHTHALMIC | Status: DC
  Administered 2021-07-22: 1 [drp] via OPHTHALMIC
  Filled 2021-07-22: qty 2.5

## 2021-07-22 MED ORDER — MIDAZOLAM HCL (PF) 5 MG/ML IJ SOLN
4.0000 mg | Freq: Once | INTRAMUSCULAR | Status: AC
  Administered 2021-07-22: 4 mg via INTRAMUSCULAR
  Filled 2021-07-22: qty 1

## 2021-07-22 MED ORDER — MIDAZOLAM HCL 5 MG/5ML IJ SOLN
4.0000 mg | Freq: Once | INTRAMUSCULAR | Status: DC
  Filled 2021-07-22: qty 4

## 2021-07-22 MED ORDER — POLYETHYLENE GLYCOL 3350 17 G PO PACK: 17.0000 g | PACK | Freq: Every day | ORAL | Status: DC | PRN

## 2021-07-22 MED ORDER — CALCIUM CARBONATE-VITAMIN D 500-200 MG-UNIT PO TABS
1.0000 | ORAL_TABLET | Freq: Every day | ORAL | Status: DC
  Administered 2021-07-22 – 2021-07-23 (×2): 1 via ORAL
  Filled 2021-07-22 (×2): qty 1

## 2021-07-22 MED ORDER — CARBIDOPA-LEVODOPA 25-100 MG PO TABS
2.0000 | ORAL_TABLET | Freq: Every day | ORAL | Status: DC
  Administered 2021-07-22: 2 via ORAL
  Filled 2021-07-22: qty 2

## 2021-07-22 MED ORDER — VITAMIN D 25 MCG (1000 UNIT) PO TABS
1000.0000 [IU] | ORAL_TABLET | Freq: Every day | ORAL | Status: DC
  Administered 2021-07-22 – 2021-07-23 (×2): 1000 [IU] via ORAL
  Filled 2021-07-22 (×2): qty 1

## 2021-07-22 MED ORDER — SODIUM CHLORIDE 0.9 % IV BOLUS
1000.0000 mL | Freq: Once | INTRAVENOUS | Status: AC
  Administered 2021-07-22: 1000 mL via INTRAVENOUS

## 2021-07-22 MED ORDER — CARBIDOPA-LEVODOPA 25-100 MG PO TABS
0.5000 | ORAL_TABLET | ORAL | Status: DC
  Administered 2021-07-22 – 2021-07-23 (×2): 0.5 via ORAL
  Filled 2021-07-22: qty 1
  Filled 2021-07-22: qty 0.5

## 2021-07-22 MED ORDER — CARBIDOPA-LEVODOPA 25-100 MG PO TABS: ORAL_TABLET | ORAL | 1 refills | Status: DC

## 2021-07-22 MED ORDER — CARBIDOPA-LEVODOPA ER 50-200 MG PO TBCR
1.0000 | EXTENDED_RELEASE_TABLET | Freq: Every day | ORAL | Status: DC
  Filled 2021-07-22: qty 1

## 2021-07-22 MED ORDER — SODIUM CHLORIDE 0.9 % IV SOLN: INTRAVENOUS | Status: AC

## 2021-07-22 MED ORDER — PRAMIPEXOLE DIHYDROCHLORIDE 0.25 MG PO TABS
0.5000 mg | ORAL_TABLET | Freq: Three times a day (TID) | ORAL | Status: DC
  Administered 2021-07-22 – 2021-07-23 (×4): 0.5 mg via ORAL
  Filled 2021-07-22 (×6): qty 2

## 2021-07-22 MED ORDER — SERTRALINE HCL 100 MG PO TABS
100.0000 mg | ORAL_TABLET | Freq: Every day | ORAL | Status: DC
  Administered 2021-07-22 – 2021-07-23 (×2): 100 mg via ORAL
  Filled 2021-07-22 (×2): qty 1

## 2021-07-22 MED ORDER — FLUDROCORTISONE ACETATE 0.1 MG PO TABS
0.1000 mg | ORAL_TABLET | Freq: Every day | ORAL | Status: DC
  Administered 2021-07-22 – 2021-07-23 (×2): 0.1 mg via ORAL
  Filled 2021-07-22 (×2): qty 1

## 2021-07-22 MED ORDER — CLONAZEPAM 0.5 MG PO TABS
0.5000 mg | ORAL_TABLET | Freq: Two times a day (BID) | ORAL | Status: DC
  Administered 2021-07-22 – 2021-07-23 (×3): 0.5 mg via ORAL
  Filled 2021-07-22 (×3): qty 1

## 2021-07-22 MED ORDER — CARBIDOPA-LEVODOPA 25-100 MG PO TABS
1.0000 | ORAL_TABLET | Freq: Every day | ORAL | Status: DC
  Administered 2021-07-23: 1 via ORAL
  Filled 2021-07-22: qty 1

## 2021-07-22 NOTE — ED Notes (Signed)
Tele  Breakfast Ordered 

## 2021-07-22 NOTE — Consult Note (Signed)
Reason for Consult:Dyskinesia Referring Physician: Dr. Karleen Hampshire  HPI: [name] is a 75 y.o. female with history of Parkinson's disease, managed by Dr. Carles Collet, who is s/p right chest pulse generator implant for bilateral DBS leads on 07/14/2021. The patient tolerated the procedure well and was discharged the next day. She presented to the [facility] with severe dyskinesia/chorea that began around 3 PM on Thursday. Her DBS has yet to be activated. On Sinemet 25/100 4x per day in addition to an evening extended release dose (50/200). Took "two extra pills" after dyskinesias started, thinking that the movements would improve. However, after the doses her chorea steadily worsened so EMS was called. EMS administered 5 mg IM Versed with some improvement, but still with significant chorea on arrival which worsened somewhat during initial ED stay. CT head was obtained and revealed no acute intracranial abnormalities. Neurology was consulted for assistance. She was then admitted for further management and observation for worsening dyskinesia.   Past Medical History:  Diagnosis Date   Allergy    ANEMIA-NOS 09/25/2007   Anxiety    Arthritis    B12 DEFICIENCY 05/03/2007   Blood transfusion without reported diagnosis    CAD (coronary artery disease) 01/2010   MI, Nishan   Cardiomyopathy ([place]) 02/08/2010   H/o this 2012 after urosepsis, no recurrence.    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    OSTEOPOROSIS 08/2009   bisphosphonate on hold 2/2 dysphagia, on reclast done in August each year   Parkinson's disease ([place]) 08/25/2015   Dx Dr [name] 07/2015    PONV (postoperative nausea and vomiting)    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   Takotsubo cardiomyopathy 2008   due to E coli urosepsis    Past Surgical History:  Procedure Laterality Date   ABDOMINAL HYSTERECTOMY  1970s   IUD infection - first partial then with oophorectomy (cysts), complication - low blood pressure   CATARACT EXTRACTION Bilateral    CHOLECYSTECTOMY     complication - low blood pressure   COLONOSCOPY  06/2008   h/o polyps but latest WNL, rec rpt 10 yrs Olevia Perches)   COLONOSCOPY  11/2018   multiple TAs (10 polyps total), rpt 2 yrs Fuller Plan)   CYSTOSCOPY  12/2013   abx treatment for recurrent cystitis   DEXA  04/2013   T -2.9 @ femur, -1.6 @ spine   DEXA  04/2017   T -2.9 hip, -0.7 spine   ESOPHAGOGASTRODUODENOSCOPY  12/2017   WNL, regardless esophagus dilated, small HH Fuller Plan)   EYE SURGERY     LUMBAR LAMINECTOMY/DECOMPRESSION MICRODISCECTOMY Right 11/27/2019   Right Lumbar Four-Five foraminotomy;  Erline Levine, MD)   MINOR PLACEMENT OF FIDUCIAL N/A 06/30/2021   Procedure: MINOR PLACEMENT OF FIDUCIAL;  Surgeon: Erline Levine, MD;  Location: [facility];  Service: Neurosurgery;  Laterality: N/A;  Minor room   PTOSIS REPAIR Bilateral 10/2020   Plastic Surgery  PULSE GENERATOR IMPLANT Right 07/14/2021   Procedure: UNILATERAL PULSE GENERATOR IMPLANT;  Surgeon: Erline Levine, MD;  Location: [facility];  Service: Neurosurgery;  Laterality: Right;   SUBTHALAMIC STIMULATOR INSERTION Bilateral 07/07/2021   Procedure: Deep brain stimulator placement;  Surgeon: Erline Levine, MD;  Location: [facility];  Service: Neurosurgery;  Laterality: Bilateral;   TUBAL LIGATION     UPPER GASTROINTESTINAL ENDOSCOPY      Family History  Problem Relation Age of Onset   Heart attack Father    Diabetes Father     Prostate cancer Father    Esophageal cancer Mother    Lung cancer Brother    Breast cancer Sister    Ovarian cancer Sister    Lung cancer Brother    CAD Brother    Uterine cancer Sister    Clotting disorder Son    Healthy Son    Healthy Daughter    Healthy Daughter    Colon cancer Neg Hx    Rectal cancer Neg Hx    Stomach cancer Neg Hx     Social History:  reports that she has never smoked. She has never used smokeless tobacco. She reports that she does not currently use drugs after having used the following drugs: Nitrous oxide. She reports that she does not drink alcohol.  Allergies:  Allergies  Allergen Reactions   Amitriptyline Other (See Comments)    Sedated next morning   Ciprofloxacin Nausea And Vomiting   Cymbalta [Duloxetine Hcl] Other (See Comments)    Worsened depression   Iohexol Other (See Comments)     Code: HIVES, Desc: PT developed 2 hives, followed by SOB, severe headache post 87cc's [address] Onset Date: [id number]    Lyrica [Pregabalin] Other (See Comments)    Tried during hospitalization - unsure effects but unable to tolerate   Imipramine Hcl Rash   Iodine Rash   Lidocaine Hcl Rash   Morphine Sulfate Rash   Neosporin [Neomycin-Bacitracin Zn-Polymyx] Rash and Other (See Comments)    Worsened skin breaking out   Sulfamethoxazole Rash   Tetracyclines & Related Rash    Medications: I have reviewed the patient's current medications.  Results for orders placed or performed during the hospital encounter of 07/21/21 (from the past 48 hour(s))  CBC with Differential     Status: Abnormal   Collection Time: 07/21/21 10:16 PM  Result Value Ref Range   WBC 12.3 (H) 4.0 - 10.5 K/uL   RBC 3.97 3.87 - 5.11 MIL/uL   Hemoglobin 11.1 (L) 12.0 - 15.0 g/dL   HCT 36.1 36.0 - 46.0 %   MCV 90.9 80.0 - 100.0 fL   MCH 28.0 26.0 - 34.0 pg   MCHC 30.7 30.0 - 36.0 g/dL   RDW 14.6 11.5 - 15.5 %   Platelets 404 (H) 150 - 400 K/uL   nRBC 0.0 0.0 - 0.2 %    Neutrophils Relative % 68 %   Neutro Abs 8.5 (H) 1.7 - 7.7 K/uL   Lymphocytes Relative 20 %   Lymphs Abs 2.4 0.7 - 4.0 K/uL   Monocytes Relative 9 %   Monocytes Absolute 1.1 (H) 0.1 - 1.0 K/uL   Eosinophils Relative 2 %   Eosinophils Absolute 0.2 0.0 - 0.5 K/uL   Basophils Relative 1 %   Basophils Absolute 0.1 0.0 - 0.1 K/uL   Immature Granulocytes 0 %   Abs Immature Granulocytes 0.04 0.00 - 0.07 K/uL    Comment: Performed at [facility], [address] [place], [place] [address]  Comprehensive metabolic panel     Status: Abnormal   Collection Time: 07/21/21 10:16 PM  Result Value Ref Range   Sodium 140 135 - 145 mmol/L   Potassium 4.2 3.5 - 5.1 mmol/L   Chloride 105 98 - 111 mmol/L   CO2 24 22 - 32 mmol/L   Glucose, Bld 89 70 - 99 mg/dL    Comment: Glucose reference range applies only to samples taken after fasting for at least 8 hours.   BUN 24 (H) 8 - 23 mg/dL   Creatinine, Ser 0.71 0.44 - 1.00 mg/dL   Calcium 9.5 8.9 - 10.3 mg/dL   Total Protein 6.5 6.5 - 8.1 g/dL   Albumin 3.9 3.5 - 5.0 g/dL   AST 27 15 - 41 U/L   ALT <5 0 - 44 U/L   Alkaline Phosphatase 65 38 - 126 U/L   Total Bilirubin 1.4 (H) 0.3 - 1.2 mg/dL   GFR, Estimated >60 >60 mL/min    Comment: (NOTE) Calculated using the CKD-EPI Creatinine Equation (2021)    Anion gap 11 5 - 15    Comment: Performed at [facility] [address], [place], [place] [address]  CK     Status: Abnormal   Collection Time: 07/21/21 10:16 PM  Result Value Ref Range   Total CK 530 (H) 38 - 234 U/L    Comment: Performed at [facility], [place] [address], [place], [place] [address]  Resp Panel by RT-PCR (Flu A&B, Covid) Nasopharyngeal Swab     Status: None   Collection Time: 07/22/21  2:28 AM   Specimen: Nasopharyngeal Swab; Nasopharyngeal(NP) swabs in vial transport medium  Result Value Ref Range   SARS Coronavirus 2 by RT PCR NEGATIVE NEGATIVE    Comment: (NOTE) SARS-CoV-2 target nucleic acids are NOT  DETECTED.  The SARS-CoV-2 RNA is generally detectable in upper respiratory specimens during the acute phase of infection. The lowest concentration of SARS-CoV-2 viral copies this assay can detect is 138 copies/mL. A negative result does not preclude SARS-Cov-2 infection and should not be used as the sole basis for treatment or other patient management decisions. A negative result may occur with  improper specimen collection/handling, submission of specimen other than nasopharyngeal swab, presence of viral mutation(s) within the areas targeted by this assay, and inadequate number of viral copies(<138 copies/mL). A negative result must be combined with clinical observations, patient history, and epidemiological information. The expected result is Negative.  Fact Sheet for Patients:  EntrepreneurPulse.com.au  Fact Sheet for Healthcare Providers:  IncredibleEmployment.be  This test is no t yet approved or cleared by the [place] FDA and  has been authorized for detection and/or diagnosis of SARS-CoV-2 by FDA under an Emergency Use Authorization (EUA). This EUA will remain  in effect (meaning this test can be used) for the duration of the COVID-19 declaration under Section 564(b)(1) of the Act, 21 U.S.C.section 360bbb-3(b)(1), unless the authorization is terminated  or revoked sooner.       Influenza A by PCR NEGATIVE NEGATIVE   Influenza B by PCR NEGATIVE NEGATIVE    Comment: (NOTE) The Xpert Xpress SARS-CoV-2/FLU/RSV plus assay is intended as an aid in the diagnosis of influenza from Nasopharyngeal swab specimens and should not be used as a sole basis for treatment. Nasal washings and aspirates are unacceptable for Xpert Xpress SARS-CoV-2/FLU/RSV testing.  Fact Sheet for Patients: EntrepreneurPulse.com.au  Fact Sheet for Healthcare Providers: IncredibleEmployment.be  This test is not  yet approved or  cleared by the [place] and has been authorized for detection and/or diagnosis of SARS-CoV-2 by FDA under an Emergency Use Authorization (EUA). This EUA will remain in effect (meaning this test can be used) for the duration of the COVID-19 declaration under Section 564(b)(1) of the Act, 21 U.S.C. section 360bbb-3(b)(1), unless the authorization is terminated or revoked.  Performed at [facility], [place] [address] [place], [place] [address]   CBC     Status: Abnormal   Collection Time: 07/22/21  4:31 AM  Result Value Ref Range   WBC 12.3 (H) 4.0 - 10.5 K/uL   RBC 4.32 3.87 - 5.11 MIL/uL   Hemoglobin 11.9 (L) 12.0 - 15.0 g/dL   HCT 38.9 36.0 - 46.0 %   MCV 90.0 80.0 - 100.0 fL   MCH 27.5 26.0 - 34.0 pg   MCHC 30.6 30.0 - 36.0 g/dL   RDW 14.9 11.5 - 15.5 %   Platelets 399 150 - 400 K/uL   nRBC 0.0 0.0 - 0.2 %    Comment: Performed at [facility], [place] [address], [place], [place] [address]  Comprehensive metabolic panel     Status: Abnormal   Collection Time: 07/22/21  4:31 AM  Result Value Ref Range   Sodium 142 135 - 145 mmol/L   Potassium 3.4 (L) 3.5 - 5.1 mmol/L   Chloride 108 98 - 111 mmol/L   CO2 24 22 - 32 mmol/L   Glucose, Bld 100 (H) 70 - 99 mg/dL    Comment: Glucose reference range applies only to samples taken after fasting for at least 8 hours.   BUN 21 8 - 23 mg/dL   Creatinine, Ser 0.68 0.44 - 1.00 mg/dL   Calcium 9.4 8.9 - 10.3 mg/dL   Total Protein 7.0 6.5 - 8.1 g/dL   Albumin 3.8 3.5 - 5.0 g/dL   AST 31 15 - 41 U/L   ALT 6 0 - 44 U/L   Alkaline Phosphatase 70 38 - 126 U/L   Total Bilirubin 0.6 0.3 - 1.2 mg/dL   GFR, Estimated >60 >60 mL/min    Comment: (NOTE) Calculated using the CKD-EPI Creatinine Equation (2021)    Anion gap 10 5 - 15    Comment: Performed at [facility] [address], [place], [place] [address]  CK     Status: Abnormal   Collection Time: 07/22/21  4:31 AM  Result Value Ref Range   Total CK 1,464 (H) 38  - 234 U/L    Comment: Performed at [facility], [place] [address], [place], [place] [address]   *Note: Due to a large number of results and/or encounters for the requested time period, some results have not been displayed. A complete set of results can be found in Results Review.    CT HEAD WO CONTRAST (5MM)  Result Date: 07/22/2021 CLINICAL DATA:  History of recent deep brain stimulator placement EXAM: CT HEAD WITHOUT CONTRAST TECHNIQUE: Contiguous axial images were obtained from the base of the skull through the vertex without intravenous contrast. COMPARISON:  07/08/2021 FINDINGS: Brain: Deep brain stimulators are again identified extending towards the subthalamic regions bilaterally. No findings to suggest acute hemorrhage, acute infarction or space-occupying mass lesion are seen. The previously seen pneumocephalus has resolved in the interval. Vascular: No hyperdense vessel or unexpected calcification. Skull: Normal. Negative for fracture or focal lesion. Sinuses/Orbits: No acute finding. Other: None IMPRESSION: Status post deep brain stimulator  placement unchanged from the prior exam. No acute abnormality noted. Electronically Signed   By: Inez Catalina M.D.   On: 07/22/2021 01:58    ROS: Per HPI Blood pressure (!) 143/68, pulse 75, temperature 97.8 F (36.6 C), temperature source Axillary, resp. rate 20, SpO2 100 %. Physical Exam: Patient is awake, A/O X 4, conversant, and in good spirits. They are in NAD and VSS. Doing well. Speech is fluent and appropriate. MAEW with good strength. Sensation to light touch is intact. PERLA, EOMI. CNs grossly intact. Significant dyskinesias, face, BUE and BLE. Dressings are clean dry intact. Incisions are well approximated with no drainage, erythema, or edema.  Assessment/Plan: 75 y.o. female with history of Parkinson's disease, managed by Dr. Carles Collet, who is s/p right chest pulse generator implant for bilateral DBS leads on 07/14/2021. She was managing well  until Thurdday afternoon when she began to have worsening dyskinesias. Her family was concerned and notified EMS. She was given multiple doses of benzodiazepines with moderate improvement in her symptoms. CT head was unremarkable. Her DBS has yet to be activated. Her increased dyskinesia is likely related to probe effect from the electrodes and medication. The dyskinesia is not a complication from her procedure and the patient will likely improve with reduction in her medication. Case discussed with Dr. Carles Collet. Recommendation is to decrease the patient's levodopa to 1 dose in the morning, 1/2 dose at lunch time, and 1/2 dose at 4 pm. If her stiffness and ability to ambulate become affected by the reduction in medication, the dose will need to be up titrated.    Marvis Moeller, DNP, NP-C 07/22/2021, 9:52 AM

## 2021-07-22 NOTE — ED Notes (Signed)
This RN took pt to CT. CT completed. Pt woke up briefly and went back to sleep. Pt now back in room, hooked up to monitor, resting.

## 2021-07-22 NOTE — Telephone Encounter (Signed)
decrease levodopa from 2/1/1 to 1/0.5/0.5 was updated in pt chart no new script sent to pharmacy.

## 2021-07-22 NOTE — Consult Note (Signed)
NEURO HOSPITALIST CONSULT NOTE   Requestig physician: Dr. Sedonia Small  Reason for Consult:  Acute onset of chorea  History obtained from:  Patient, Son and Chart     HPI:                                                                                                                                          [name] is an 75 y.o. female with CAD, B12 deficiency, HLD, SLE, prior episode of CHF with sepsis, advanced Parkinson's disease, s/p DBS lead placement 2 weeks ago by Dr. Vertell Limber followed by signal generator placement subcutaneously to right anterior chest 1 week ago, patient of Dr. Carles Collet, who presented with severe dyskinesia/chorea beginning at 3 PM on Thursday. The DBS placed 2 weeks ago has not yet been activated. On Sinemet 25/100 4x per day in addition to an evening extended release dose (50/200). Took "two extra pills" after dyskinesias started, thinking that the movements would improve. However, after the doses her chorea steadily worsened so EMS was called. EMS administered 5 mg IM Versed with some improvement, but still with significant chorea on arrival which worsened somewhat during initial ED stay. On clonazepam BID but has not missed doses. No new medication additions and no newly stopped medications. Denies any medication noncompliance except for the two extra Sinemet doses described above. Her only other Parkinson's medication is Mirapex.   Past Medical History:  Diagnosis Date   Allergy    ANEMIA-NOS 09/25/2007   Anxiety    Arthritis    B12 DEFICIENCY 05/03/2007   Blood transfusion without reported diagnosis    CAD (coronary artery disease) 01/2010   MI, Nishan   Cardiomyopathy ([place]) 02/08/2010   H/o this 2012 after urosepsis, no recurrence.    Cataract    CHF (congestive heart failure) (HCC)    with episode of sepsis   Complication of anesthesia    Depression    not recently   FIBROMYALGIA 05/03/2007   Fibromyalgia    GERD 02/22/2010   Glaucoma  02/2013   [facility]   History of CHF (congestive heart failure) 01/2010   History of colon polyps 2004   HYPERLIPIDEMIA 12/19/2007   HYPOTENSION, ORTHOSTATIC 12/06/2008   Interstitial cystitis    [place] now Dr Amalia Hailey   Lupus (systemic lupus erythematosus) ([place]) 02/08/2010   MCTD (mixed connective tissue disease) ([place]) 02/08/2010   OSTEOPOROSIS 08/2009   bisphosphonate on hold 2/2 dysphagia, on reclast done in August each year   Parkinson's disease ([place]) 08/25/2015   Dx Dr [name] 07/2015    PONV (postoperative nausea and vomiting)    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   Takotsubo cardiomyopathy 2008   due to E coli urosepsis    Past  Surgical History:  Procedure Laterality Date   ABDOMINAL HYSTERECTOMY  1970s   IUD infection - first partial then with oophorectomy (cysts), complication - low blood pressure   CATARACT EXTRACTION Bilateral    CHOLECYSTECTOMY     complication - low blood pressure   COLONOSCOPY  06/2008   h/o polyps but latest WNL, rec rpt 10 yrs Olevia Perches)   COLONOSCOPY  11/2018   multiple TAs (10 polyps total), rpt 2 yrs Fuller Plan)   CYSTOSCOPY  12/2013   abx treatment for recurrent cystitis   DEXA  04/2013   T -2.9 @ femur, -1.6 @ spine   DEXA  04/2017   T -2.9 hip, -0.7 spine   ESOPHAGOGASTRODUODENOSCOPY  12/2017   WNL, regardless esophagus dilated, small HH [facility])   EYE SURGERY     LUMBAR LAMINECTOMY/DECOMPRESSION MICRODISCECTOMY Right 11/27/2019   Right Lumbar Four-Five foraminotomy;  Erline Levine, MD)   MINOR PLACEMENT OF FIDUCIAL N/A 06/30/2021   Procedure: MINOR PLACEMENT OF FIDUCIAL;  Surgeon: Erline Levine, MD;  Location: [facility];  Service: Neurosurgery;  Laterality: N/A;  Minor room   PTOSIS REPAIR Bilateral 10/2020   Plastic Surgery   PULSE GENERATOR IMPLANT Right 07/14/2021   Procedure: UNILATERAL PULSE GENERATOR IMPLANT;  Surgeon: Erline Levine, MD;  Location: [facility];  Service: Neurosurgery;  Laterality: Right;    SUBTHALAMIC STIMULATOR INSERTION Bilateral 07/07/2021   Procedure: Deep brain stimulator placement;  Surgeon: Erline Levine, MD;  Location: [facility];  Service: Neurosurgery;  Laterality: Bilateral;   TUBAL LIGATION     UPPER GASTROINTESTINAL ENDOSCOPY      Family History  Problem Relation Age of Onset   Heart attack Father    Diabetes Father    Prostate cancer Father    Esophageal cancer Mother    Lung cancer Brother    Breast cancer Sister    Ovarian cancer Sister    Lung cancer Brother    CAD Brother    Uterine cancer Sister    Clotting disorder Son    Healthy Son    Healthy Daughter    Healthy Daughter    Colon cancer Neg Hx    Rectal cancer Neg Hx    Stomach cancer Neg Hx            Social History:  reports that she has never smoked. She has never used smokeless tobacco. She reports that she does not currently use drugs after having used the following drugs: Nitrous oxide. She reports that she does not drink alcohol.  Allergies  Allergen Reactions   Amitriptyline Other (See Comments)    Sedated next morning   Ciprofloxacin Nausea And Vomiting   Cymbalta [Duloxetine Hcl] Other (See Comments)    Worsened depression   Iohexol Other (See Comments)     Code: HIVES, Desc: PT developed 2 hives, followed by SOB, severe headache post 87cc's Omnip[address]., Onset Date: [id number]    Lyrica [Pregabalin] Other (See Comments)    Tried during hospitalization - unsure effects but unable to tolerate   Imipramine Hcl Rash   Iodine Rash   Lidocaine Hcl Rash   Morphine Sulfate Rash   Neosporin [Neomycin-Bacitracin Zn-Polymyx] Rash and Other (See Comments)    Worsened skin breaking out   Sulfamethoxazole Rash   Tetracyclines & Related Rash    MEDICATIONS:  No current facility-administered medications on file prior to encounter.   Current Outpatient Medications on File  Prior to Encounter  Medication Sig Dispense Refill   acetaminophen (TYLENOL) 650 MG CR tablet Take 2 tablets (1,300 mg total) by mouth every 8 (eight) hours as needed for pain.     AMBULATORY NON FORMULARY MEDICATION Lift chair Dx:  G20 1 Device 0   Calcium Carbonate-Vitamin D (CALCIUM 600/VITAMIN D) 600-400 MG-UNIT chew tablet Chew 1 tablet by mouth daily.     carbidopa-levodopa (SINEMET CR) 50-200 MG tablet Take 1 tablet by mouth at bedtime. 30 tablet 0   carbidopa-levodopa (SINEMET IR) 25-100 MG tablet TAKE 2 TABLETS BY MOUTH AT 6 AM ,1 TABLET AT 9:30 AM, 1 PM AND 4:30 PM. MAY TAKE AN EXTRA ONE-HALF TABLET BY MOUTH AS NEEDED IN THE EVENING 360 tablet 1   Cholecalciferol (VITAMIN D3) 25 MCG (1000 UT) CAPS Take 1 capsule (1,000 Units total) by mouth daily. 30 capsule    clonazePAM (KLONOPIN) 0.5 MG tablet TAKE 1 TABLET BY MOUTH TWICE DAILY 60 tablet 0   clotrimazole-betamethasone (LOTRISONE) cream Apply 1 application topically daily. (Patient taking differently: Apply 1 application topically daily as needed (irritation).) 30 g 0   cyanocobalamin (,VITAMIN B-12,) 1000 MCG/ML injection INJECT 1 ML (1,000 MCG TOTAL) INTO THE MUSCLE EVERY 30 (THIRTY) DAYS. 3 mL 2   denosumab (PROLIA) 60 MG/ML SOSY injection Inject 60 mg into the skin every 6 (six) months.     fludrocortisone (FLORINEF) 0.1 MG tablet Take 1 tablet (0.1 mg total) by mouth daily. 90 tablet 1   Glycerin-Hypromellose-PEG 400 (ARTIFICIAL TEARS) 0.2-0.2-1 % SOLN Place 1 drop into both eyes daily as needed (dry eyes).     HYDROcodone-acetaminophen (NORCO/VICODIN) 5-325 MG tablet Take 1 tablet by mouth every 6 (six) hours as needed for moderate pain.     latanoprost (XALATAN) 0.005 % ophthalmic solution Place 1 drop into both eyes at bedtime.     polyethylene glycol (MIRALAX / GLYCOLAX) packet Take 17 g by mouth as needed. (Patient taking differently: Take 17 g by mouth as needed for moderate constipation.) 30 each 1   pramipexole (MIRAPEX)  0.5 MG tablet TAKE 1 TABLET BY MOUTH 3  TIMES DAILY (Patient taking differently: Take 0.5 mg by mouth 3 (three) times daily.) 270 tablet 0   sertraline (ZOLOFT) 100 MG tablet TAKE 1 TABLET BY MOUTH  DAILY 90 tablet 3   traMADol (ULTRAM) 50 MG tablet Take 1 tablet (50 mg total) by mouth every 6 (six) hours as needed for moderate pain. 30 tablet 0   nystatin-triamcinolone ointment (MYCOLOG) Apply 1 application topically 2 (two) times daily. Limit to 10 days use (Patient not taking: Reported on 07/22/2021) 30 g 0     ROS:  Severe dyskinesias with sense of fatigue/exhaustion. No other complaints.    Blood pressure 127/71, pulse 76, temperature (!) 97.3 F (36.3 C), temperature source Axillary, resp. rate 14, SpO2 98 %.   General Examination:                                                                                                       Physical Exam  General: Pronounced chorea involving all 4 limbs, trunk, neck, jaw and tongue.  HEENT-  Post operative changes to scalp. Slight weeping from sutures due to head rubbing against sheets repeatedly due to chorea   Lungs- Respirations unlabored other than occasional sighing as patient endorses fatigue.  Extremities- Warm and well perfused. No edema.    Neurological Examination Mental Status: Awake and alert. Good insight. Speech is fluent. Comprehension intact. Oriented to the city, state and year but not the day or the month.  Cranial Nerves: II: Discs flat bilaterally; Visual fields grossly normal,  III,IV, VI: ptosis not present, extra-ocular motions intact bilaterally pupils equal, round, reactive to light and accommodation V,VII: smile symmetric, facial light touch sensation normal bilaterally VIII: hearing normal bilaterally IX,X: uvula rises symmetrically XI: bilateral shoulder shrug XII: midline tongue  extension Motor: Pronounced chorea involving all 4 limbs, trunk, neck, jaw and tongue. The movements are constant with at times ballistic flailing movements requiring padding to bed rails. The movements are equally severe on the left and right.  In this context, she exhibits 5/5 strength in all 4 extremities as she resists examiner to command briefly.  She is able to suppress to a degree, but not completely, the dyskinetic movements when asked and coached. She endorses a sense of the movements being irresistible and she is unable to maintain partial suppression for more than 15 seconds.  No abrupt jerking or twitching movements concerning for seizure.  Sensory: Light touch intact throughout, bilaterally Deep Tendon Reflexes: Unable to definitively assess due to constant movements.  Cerebellar: In the context of the chorea, no tremor or ataxia is noted when patient is briefly able to perform FNF maneuver; other limbs, her trunk, jaw and tongue continue to writhe when she performs FNF.  Gait: Unable to assess   Lab Results: Basic Metabolic Panel: Recent Labs  Lab 07/21/21 2216  NA 140  K 4.2  CL 105  CO2 24  GLUCOSE 89  BUN 24*  CREATININE 0.71  CALCIUM 9.5    CBC: Recent Labs  Lab 07/21/21 2216  WBC 12.3*  NEUTROABS 8.5*  HGB 11.1*  HCT 36.1  MCV 90.9  PLT 404*    Cardiac Enzymes: Recent Labs  Lab 07/21/21 2216  CKTOTAL 530*    Lipid Panel: No results for input(s): CHOL, TRIG, HDL, CHOLHDL, VLDL, LDLCALC in the last 168 hours.  Imaging: CT HEAD WO CONTRAST (5MM)  Result Date: 07/22/2021 CLINICAL DATA:  History of recent deep brain stimulator placement EXAM: CT HEAD WITHOUT CONTRAST TECHNIQUE: Contiguous axial images were obtained from the base of the skull through the vertex without intravenous contrast. COMPARISON:  07/08/2021 FINDINGS: Brain: Deep brain stimulators  are again identified extending towards the subthalamic regions bilaterally. No findings to suggest  acute hemorrhage, acute infarction or space-occupying mass lesion are seen. The previously seen pneumocephalus has resolved in the interval. Vascular: No hyperdense vessel or unexpected calcification. Skull: Normal. Negative for fracture or focal lesion. Sinuses/Orbits: No acute finding. Other: None IMPRESSION: Status post deep brain stimulator placement unchanged from the prior exam. No acute abnormality noted. Electronically Signed   By: Inez Catalina M.D.   On: 07/22/2021 01:58     Assessment: 75 year old female with advanced Parkinson's disease, s/p DBS lead placement 2 weeks ago followed by signal generator placement subcutaneously to right anterior chest 1 week ago, who presented with severe dyskinesia/chorea beginning at 3 PM on Thursday. The DBS placed 2 weeks ago has not yet been activated. On Sinemet 4x per day with evening extended release dose. Took "two extra pills" after dyskinesias started, thinking that the movements would improve. However, after the doses her chorea steadily worsened so EMS was called. EMS administered 5 mg IM Versed with some improvement, but still with significant chorea on arrival which worsened somewhat during initial ED stay. On clonazepam BID but has not missed doses.  1. Exam reveals severe chorea involving all 4 limbs, trunk, neck, jaw and tongue.  2. CT head: Status post deep brain stimulator placement unchanged from the prior exam. No acute abnormality noted. 3. Versed 4 mg IM resulted in gradual lessening of her chorea after about 2 minutes, with continued improvement over the next 10 minutes. This was associated with the onset of mild sedation and drowsiness.   Recommendations: 1. Additional dose of 4 mg IM Versed. Attempt to place IVs if she improves sufficiently after the second Versed dose.  2. After IV placed, administer 2 mg IV Ativan PRN dyskinesia/chorea.  3. Hold Sinemet. Restart in AM if chorea continues to be resolved and if continued resolution is  not due to a recent benzodiazepine dose.  4. In the AM, call the patient's outpatient neurologist Dr. Carles Collet for further guidance.  5. IVF 6. Monitor CK level  7. Call Dr. Vertell Limber of Neurosurgery in the AM.   Addendum: - Resolution of dyskinesias with 4 mg Versed IM x 1 in the ED. The additional 4 mg dose recommended above was not required.  - Patient now sleeping soundly without any abnormal movements noted.    Electronically signed: Dr. Kerney Elbe 07/22/2021, 2:10 AM

## 2021-07-22 NOTE — ED Notes (Signed)
Patient consumed about 45% of breakfast tray.

## 2021-07-22 NOTE — ED Notes (Signed)
Pt resting at this time. Pt to go to CT

## 2021-07-22 NOTE — Progress Notes (Signed)
Pt admitted by Dr Hal Hope earlier this am.  Please see note for detailed H&P.    [name] is a 75 y.o. female with history of Parkinson's disease who had a deep brain stimulator placed about 2 weeks ago started having abdominal movement since last evening which was persistent and was brought to the ER.  Neurosurgery and neurology consulted.  Her dyskinesia probably secondary to medications and probe.  As per the recommendations, decreased the dose.  Will monitor.    Hosie Poisson, MD

## 2021-07-22 NOTE — ED Notes (Signed)
Pt more awake on arousal. Pt able to answer name, location, age. Pt having tremors in hand which son says is baseline for patient.

## 2021-07-22 NOTE — H&P (Signed)
History and Physical    [name] [id number] DOB: 1946/06/25 DOA: 07/21/2021  PCP: [name], MD  Patient coming from: Home.  Chief Complaint: Abnormal movements.  HPI: [name] is a 75 y.o. female with history of Parkinson's disease who had a deep brain stimulator placed about 2 weeks ago started having abdominal movement since last evening which was persistent and was brought to the ER.  Has been compliant with his medications.  EMS on arrival gave Versed 5 mg.  ED Course: In the ER CT head was unremarkable.  Labs show elevated CK levels 530.  WBC count 12.3 patient was afebrile.  Neurologist on-call Dr. Cheral Marker was consulted patient movement improved after 2 mg of Ativan.  Patient admitted for further management and observation for worsening dyskinesia with history of Parkinson's.  COVID test is pending.  Review of Systems: As per HPI, rest all negative.   Past Medical History:  Diagnosis Date   Allergy    ANEMIA-NOS 09/25/2007   Anxiety    Arthritis    B12 DEFICIENCY 05/03/2007   Blood transfusion without reported diagnosis    CAD (coronary artery disease) 01/2010   MI, Nishan   Cardiomyopathy ([place]) 02/08/2010   H/o this 2012 after urosepsis, no recurrence.    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   OSTEOPOROSIS 08/2009   bisphosphonate on hold 2/2 dysphagia, on reclast done in August each year   Parkinson's disease ([place]) 08/25/2015   Dx Dr [name]  07/2015    PONV (postoperative nausea and vomiting)    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   Takotsubo cardiomyopathy 2008   due to E coli urosepsis    Past Surgical History:  Procedure Laterality Date   ABDOMINAL HYSTERECTOMY  1970s   IUD infection - first partial then with oophorectomy (cysts), complication - low blood pressure   CATARACT EXTRACTION Bilateral    CHOLECYSTECTOMY     complication - low blood pressure   COLONOSCOPY  06/2008   h/o polyps but latest WNL, rec rpt 10 yrs Olevia Perches)   COLONOSCOPY  11/2018   multiple TAs (10 polyps total), rpt 2 yrs Fuller Plan)   CYSTOSCOPY  12/2013   abx treatment for recurrent cystitis   DEXA  04/2013   T -2.9 @ femur, -1.6 @ spine   DEXA  04/2017   T -2.9 hip, -0.7 spine   ESOPHAGOGASTRODUODENOSCOPY  12/2017   WNL, regardless esophagus dilated, small HH [facility])   EYE SURGERY     LUMBAR LAMINECTOMY/DECOMPRESSION MICRODISCECTOMY Right 11/27/2019   Right Lumbar Four-Five foraminotomy;  Erline Levine, MD)   MINOR PLACEMENT OF FIDUCIAL N/A 06/30/2021   Procedure: MINOR PLACEMENT OF FIDUCIAL;  Surgeon: Erline Levine, MD;  Location: [place];  Service: Neurosurgery;  Laterality: N/A;  Minor room   PTOSIS REPAIR Bilateral 10/2020   Plastic Surgery   PULSE GENERATOR IMPLANT Right 07/14/2021   Procedure:  UNILATERAL PULSE GENERATOR IMPLANT;  Surgeon: Erline Levine, MD;  Location: [place];  Service: Neurosurgery;  Laterality: Right;   SUBTHALAMIC STIMULATOR INSERTION Bilateral 07/07/2021   Procedure: Deep brain stimulator placement;  Surgeon: Erline Levine, MD;  Location: [place];  Service: Neurosurgery;  Laterality: Bilateral;   TUBAL LIGATION     UPPER GASTROINTESTINAL ENDOSCOPY       reports that she has never smoked. She has never used smokeless tobacco. She reports that she does not currently use drugs after having used the following drugs: Nitrous oxide. She reports that she does not drink alcohol.  Allergies  Allergen  Reactions   Amitriptyline Other (See Comments)    Sedated next morning   Ciprofloxacin Nausea And Vomiting   Cymbalta [Duloxetine Hcl] Other (See Comments)    Worsened depression   Iohexol Other (See Comments)     Code: HIVES, Desc: PT developed 2 hives, followed by SOB, severe headache post 87cc's [address]., Onset Date: [id number]    Lyrica [Pregabalin] Other (See Comments)    Tried during hospitalization - unsure effects but unable to tolerate   Imipramine Hcl Rash   Iodine Rash   Lidocaine Hcl Rash   Morphine Sulfate Rash   Neosporin [Neomycin-Bacitracin Zn-Polymyx] Rash and Other (See Comments)    Worsened skin breaking out   Sulfamethoxazole Rash   Tetracyclines & Related Rash    Family History  Problem Relation Age of Onset   Heart attack Father    Diabetes Father    Prostate cancer Father    Esophageal cancer Mother    Lung cancer Brother    Breast cancer Sister    Ovarian cancer Sister    Lung cancer Brother    CAD Brother    Uterine cancer Sister    Clotting disorder Son    Healthy Son    Healthy Daughter    Healthy Daughter    Colon cancer Neg Hx    Rectal cancer Neg Hx    Stomach cancer Neg Hx     Prior to Admission medications   Medication Sig Start Date End Date Taking? Authorizing Provider  acetaminophen (TYLENOL) 650 MG CR tablet Take 2 tablets (1,300 mg total) by mouth every 8 (eight) hours as needed for pain. 05/17/21  Yes Dwyane Dee, MD  AMBULATORY NON FORMULARY MEDICATION Lift chair Dx:  G20 09/29/20  Yes [name], Eustace Quail, DO  Calcium Carbonate-Vitamin D (CALCIUM 600/VITAMIN D) 600-400 MG-UNIT chew tablet Chew 1 tablet by mouth daily. 12/10/17  Yes [name], MD  carbidopa-levodopa (SINEMET CR) 50-200 MG tablet Take 1 tablet by mouth at bedtime. 07/20/21  Yes [name], Eustace Quail, DO  carbidopa-levodopa (SINEMET IR) 25-100 MG tablet TAKE 2 TABLETS BY MOUTH AT 6 AM ,1 TABLET AT 9:30 AM, 1 PM AND 4:30 PM. MAY TAKE AN EXTRA ONE-HALF TABLET BY MOUTH  AS NEEDED IN THE EVENING 07/20/21  Yes [name], Eustace Quail, DO  Cholecalciferol (VITAMIN D3) 25 MCG (1000 UT) CAPS Take 1 capsule (1,000 Units total) by mouth daily. 10/10/18  Yes [name], MD  clonazePAM (KLONOPIN) 0.5 MG tablet TAKE 1 TABLET BY MOUTH TWICE DAILY 07/12/21  Yes [name], MD  clotrimazole-betamethasone (LOTRISONE) cream Apply 1 application topically daily. Patient taking differently: Apply 1 application topically daily as needed (irritation). 06/07/21  Yes [name], MD  cyanocobalamin (,VITAMIN B-12,) 1000 MCG/ML injection INJECT 1 ML (1,000 MCG TOTAL) INTO THE MUSCLE EVERY 30 (THIRTY) DAYS. 10/07/19  Yes [name], MD  denosumab (PROLIA) 60 MG/ML  SOSY injection Inject 60 mg into the skin every 6 (six) months.   Yes [provider]  fludrocortisone (FLORINEF) 0.1 MG tablet Take 1 tablet (0.1 mg total) by mouth daily. 07/20/21  Yes [name], MD  Glycerin-Hypromellose-PEG 400 (ARTIFICIAL TEARS) 0.2-0.2-1 % SOLN Place 1 drop into both eyes daily as needed (dry eyes).   Yes [provider]  HYDROcodone-acetaminophen (NORCO/VICODIN) 5-325 MG tablet Take 1 tablet by mouth every 6 (six) hours as needed for moderate pain.   Yes [provider]  latanoprost (XALATAN) 0.005 % ophthalmic solution Place 1 drop into both eyes at bedtime. 03/22/21  Yes [provider]  polyethylene glycol (MIRALAX / GLYCOLAX) packet Take 17 g by mouth as needed. Patient taking differently: Take 17 g by mouth as needed for moderate constipation. 04/19/18  Yes [name], MD  pramipexole (MIRAPEX) 0.5 MG tablet TAKE 1 TABLET BY MOUTH 3  TIMES DAILY Patient taking differently: Take 0.5 mg by mouth 3 (three) times daily. 05/31/21  Yes [name], Eustace Quail, DO  sertraline (ZOLOFT) 100 MG tablet TAKE 1 TABLET BY MOUTH  DAILY 07/05/21  Yes [name], MD  traMADol (ULTRAM) 50 MG tablet Take 1 tablet (50 mg total) by mouth every 6 (six) hours as  needed for moderate pain. 07/08/21  Yes Erline Levine, MD  nystatin-triamcinolone ointment [facility]) Apply 1 application topically 2 (two) times daily. Limit to 10 days use Patient not taking: Reported on 07/22/2021 05/27/21   [name], MD    Physical Exam: Constitutional: Moderately built and nourished. Vitals:   07/22/21 0200 07/22/21 0215 07/22/21 0300 07/22/21 0315  BP: 127/71 135/65 (!) 142/62 123/69  Pulse: 76 74 73 77  Resp: '14 16 12 13  '$ Temp:      TempSrc:      SpO2: 98% 99% 96% 96%   Eyes: Anicteric no pallor. ENMT: No discharge from the ears eyes nose and mouth. Neck: No mass felt.  No neck rigidity. Respiratory: No rhonchi or crepitations. Cardiovascular: S1-S2 heard. Abdomen: Soft nontender bowel sound present. Musculoskeletal: No edema. Skin: No rash. Neurologic: Patient is sedated. Psychiatric: Patient is sedated.   Labs on Admission: I have personally reviewed following labs and imaging studies  CBC: Recent Labs  Lab 07/21/21 2216  WBC 12.3*  NEUTROABS 8.5*  HGB 11.1*  HCT 36.1  MCV 90.9  PLT [id number]*   Basic Metabolic Panel: Recent Labs  Lab 07/21/21 2216  NA 140  K 4.2  CL 105  CO2 24  GLUCOSE 89  BUN 24*  CREATININE 0.71  CALCIUM 9.5   GFR: Estimated Creatinine Clearance: 62.3 mL/min (by C-G formula based on SCr of 0.71 mg/dL). Liver Function Tests: Recent Labs  Lab 07/21/21 2216  AST 27  ALT <5  ALKPHOS 65  BILITOT 1.4*  PROT 6.5  ALBUMIN 3.9   No results for input(s): LIPASE, AMYLASE in the last 168 hours. No results for input(s): AMMONIA in the last 168 hours. Coagulation Profile: No results for input(s): INR, PROTIME in the last 168 hours. Cardiac Enzymes: Recent Labs  Lab 07/21/21 2216  CKTOTAL 530*   BNP (last 3 results) No results for input(s): PROBNP in the last 8760 hours. HbA1C: No results for input(s): HGBA1C in the last 72 hours. CBG: No results for input(s): GLUCAP in the last 168 hours. Lipid  Profile: No results for input(s): CHOL, HDL, LDLCALC, TRIG, CHOLHDL, LDLDIRECT in the last 72 hours. Thyroid Function Tests: No results for input(s): TSH, T4TOTAL, FREET4, T3FREE, THYROIDAB  in the last 72 hours. Anemia Panel: No results for input(s): VITAMINB12, FOLATE, FERRITIN, TIBC, IRON, RETICCTPCT in the last 72 hours. Urine analysis:    Component Value Date/Time   COLORURINE YELLOW 05/16/2021 1434   APPEARANCEUR HAZY (A) 05/16/2021 1434   LABSPEC 1.005 05/16/2021 1434   PHURINE 7.0 05/16/2021 1434   GLUCOSEU 50 (A) 05/16/2021 1434   HGBUR SMALL (A) 05/16/2021 1434   HGBUR trace-lysed 09/02/2010 0954   BILIRUBINUR NEGATIVE 05/16/2021 1434   BILIRUBINUR negative 02/07/2021 1420   KETONESUR 5 (A) 05/16/2021 1434   PROTEINUR NEGATIVE 05/16/2021 1434   UROBILINOGEN 0.2 02/07/2021 1420   UROBILINOGEN 0.2 09/04/2010 1342   NITRITE POSITIVE (A) 05/16/2021 1434   LEUKOCYTESUR SMALL (A) 05/16/2021 1434   Sepsis Labs: '@LABRCNTIP'$ (procalcitonin:4,lacticidven:4) ) Recent Results (from the past 240 hour(s))  SARS Coronavirus 2 by RT PCR (hospital order, performed in [place] hospital lab) Nasopharyngeal Nasopharyngeal Swab     Status: None   Collection Time: 07/14/21  5:47 AM   Specimen: Nasopharyngeal Swab  Result Value Ref Range Status   SARS Coronavirus 2 NEGATIVE NEGATIVE Final    Comment: (NOTE) SARS-CoV-2 target nucleic acids are NOT DETECTED.  The SARS-CoV-2 RNA is generally detectable in upper and lower respiratory specimens during the acute phase of infection. The lowest concentration of SARS-CoV-2 viral copies this assay can detect is 250 copies / mL. A negative result does not preclude SARS-CoV-2 infection and should not be used as the sole basis for treatment or other patient management decisions.  A negative result may occur with improper specimen collection / handling, submission of specimen other than nasopharyngeal swab, presence of viral mutation(s) within  the areas targeted by this assay, and inadequate number of viral copies (<250 copies / mL). A negative result must be combined with clinical observations, patient history, and epidemiological information.  Fact Sheet for Patients:   StrictlyIdeas.no  Fact Sheet for Healthcare Providers: BankingDealers.co.za  This test is not yet approved or  cleared by the [place] FDA and has been authorized for detection and/or diagnosis of SARS-CoV-2 by FDA under an Emergency Use Authorization (EUA).  This EUA will remain in effect (meaning this test can be used) for the duration of the COVID-19 declaration under Section 564(b)(1) of the Act, 21 U.S.C. section 360bbb-3(b)(1), unless the authorization is terminated or revoked sooner.  Performed at [facility], [place] [address] [place], [place] [address]      Radiological Exams on Admission: CT HEAD WO CONTRAST (5MM)  Result Date: 07/22/2021 CLINICAL DATA:  History of recent deep brain stimulator placement EXAM: CT HEAD WITHOUT CONTRAST TECHNIQUE: Contiguous axial images were obtained from the base of the skull through the vertex without intravenous contrast. COMPARISON:  07/08/2021 FINDINGS: Brain: Deep brain stimulators are again identified extending towards the subthalamic regions bilaterally. No findings to suggest acute hemorrhage, acute infarction or space-occupying mass lesion are seen. The previously seen pneumocephalus has resolved in the interval. Vascular: No hyperdense vessel or unexpected calcification. Skull: Normal. Negative for fracture or focal lesion. Sinuses/Orbits: No acute finding. Other: None IMPRESSION: Status post deep brain stimulator placement unchanged from the prior exam. No acute abnormality noted. Electronically Signed   By: Inez Catalina M.D.   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  Discussed with Dr. Cheral Marker neurologist.  Patient's dyskinesia improved with Ativan.  We will closely monitor in progressive care.  Further recommendations per neurologist. Mild rhabdomyolysis likely from worsening abdominal movements.  Gently hydrate follow CK levels. Normocytic normochromic anemia check anemia panel follow CBC. History of Parkinson's status post recent deep brain stimulator placement.  We will continue patient's home dose of Sinemet pramipexole Klonopin. Patient also takes sertraline and fludrocortisone.  COVID test is pending.   DVT prophylaxis: Lovenox. Code Status: Full code. Family Communication: Patient's son. Disposition Plan: To be determined. Consults called: Neurologist.   Admission status: Observation.   Rise Patience MD [facility] Pager [phone].  If 7PM-7AM, please contact night-coverage [email or web address] Password [facility]  07/22/2021, 3:36 AM

## 2021-07-22 NOTE — ED Notes (Signed)
Dr. Hal Hope notified of pt BP per order set

## 2021-07-22 NOTE — ED Notes (Signed)
Verbal order by Dr. Cheral Marker for '4mg'$  of versed again - order placed and pharmacy called

## 2021-07-22 NOTE — ED Notes (Signed)
This RN performed swallow screen on patient. Pt passed. Pt able to take small sips of water without difficulty.

## 2021-07-22 NOTE — ED Notes (Signed)
IV able to be obtained and pt movements beginning to slow down. Pt beginning to rest. 2nd dose of Midazolam NOT given. Pharmacy notified.

## 2021-07-22 NOTE — ED Notes (Signed)
Dr. Cheral Marker at bedside to reassess pt. Pt continuing to sleep.

## 2021-07-22 NOTE — ED Notes (Signed)
Dr. Kakrakandy at bedside. 

## 2021-07-22 NOTE — ED Notes (Addendum)
Pt remains lethargic and asleep. Pt snoring. Pt responds to stimuli such as blood draw and covid test. Pt able to open eyes on command, pt able to tell me her name, and pt able to squeeze my fingers and wiggle toes

## 2021-07-23 ENCOUNTER — Other Ambulatory Visit: Payer: Self-pay

## 2021-07-23 MED ORDER — ACETAMINOPHEN 325 MG PO TABS
650.0000 mg | ORAL_TABLET | Freq: Four times a day (QID) | ORAL | Status: DC | PRN
  Administered 2021-07-23: 650 mg via ORAL
  Filled 2021-07-23: qty 2

## 2021-07-23 NOTE — Progress Notes (Signed)
Pt c/o post neck pain and HA. 8/10 No prn pain meds ordered. Dr. Darene Lamer.Opyd paged at this time.

## 2021-07-23 NOTE — Progress Notes (Signed)
Nursing dc note  Patient dcd home with son [name]. Both verbalized understanding of instructions. All belongings given to patient

## 2021-07-25 ENCOUNTER — Telehealth: Payer: Self-pay | Admitting: Neurology

## 2021-07-25 NOTE — Telephone Encounter (Signed)
Ask patient her dosage of levodopa and exact times she is taking it Find out if she is still having the excess movement that led to her recent hospital stay.  Remind her that the excess movement is caused by levodopa and if she has that, she doesn't want to take MORE levodopa or the movements will get worse.

## 2021-07-25 NOTE — Telephone Encounter (Signed)
Called patient back to tell her the medication change. She did say she forgot to tell me she is taking 1 extended release at bedtime as well

## 2021-07-25 NOTE — Telephone Encounter (Signed)
Patient is currently taking her  Carbidopa Levodopa at 6:00 1 tablet 25-100 At 1:30 1/2 tablet 25-100 At 4:30 1/2 tablet She is taking he pramipexole at 12:00 and 4:00 She hasn't been having excess movement but more stiffness and soreness she stated it maybe caused by her excessive movements at the hospital she said she hit a lot of different objects

## 2021-07-25 NOTE — Telephone Encounter (Signed)
Pt stated she was to call [name] if any changes regarding her levodopa. She is stiff and having issues walking.

## 2021-07-27 DIAGNOSIS — R531 Weakness: Secondary | ICD-10-CM | POA: Diagnosis not present

## 2021-07-27 NOTE — Telephone Encounter (Signed)
Left message to call back  

## 2021-07-29 ENCOUNTER — Ambulatory Visit (INDEPENDENT_AMBULATORY_CARE_PROVIDER_SITE_OTHER): Payer: Medicare Other | Admitting: Family Medicine

## 2021-07-29 ENCOUNTER — Other Ambulatory Visit: Payer: Self-pay

## 2021-07-29 ENCOUNTER — Encounter: Payer: Self-pay | Admitting: Family Medicine

## 2021-07-29 VITALS — BP 106/64 | HR 81 | Temp 97.7°F | Ht 65.0 in | Wt 151.5 lb

## 2021-07-29 DIAGNOSIS — G20 Parkinson's disease: Secondary | ICD-10-CM | POA: Diagnosis not present

## 2021-07-29 DIAGNOSIS — I951 Orthostatic hypotension: Secondary | ICD-10-CM

## 2021-07-29 DIAGNOSIS — G20A1 Parkinson's disease without dyskinesia, without mention of fluctuations: Secondary | ICD-10-CM

## 2021-07-29 DIAGNOSIS — E538 Deficiency of other specified B group vitamins: Secondary | ICD-10-CM | POA: Diagnosis not present

## 2021-07-29 DIAGNOSIS — Z7189 Other specified counseling: Secondary | ICD-10-CM

## 2021-07-29 MED ORDER — CYANOCOBALAMIN 1000 MCG/ML IJ SOLN
1000.0000 ug | Freq: Once | INTRAMUSCULAR | Status: AC
  Administered 2021-07-29: 1000 ug via INTRAMUSCULAR

## 2021-07-29 NOTE — Patient Instructions (Addendum)
Good to see you today. You are doing well.  Return as needed or in 3 months at previously scheduled visit.

## 2021-07-29 NOTE — Assessment & Plan Note (Addendum)
Advanced directive - brings forms today will be scanned - wants all children involved in her medical decision making but son [name] to lead decision. Doesn't want prolonged life support if terminal condition.

## 2021-07-29 NOTE — Assessment & Plan Note (Signed)
B12 shot today. Continue monthly replacement.

## 2021-07-29 NOTE — Assessment & Plan Note (Signed)
Appreciate neurology and neurosurgery excellent care, s/p recent DBS placement, planning to activate 08/08/2021.  Stable period on current levodopa regimen - continue.

## 2021-07-29 NOTE — Assessment & Plan Note (Signed)
Continues fludrocortisone 0.'1mg'$  daily with benefit.

## 2021-07-29 NOTE — Progress Notes (Signed)
Patient ID: [name], female    DOB: [birth year], 75 y.o.   MRN: [id number]  This visit was conducted in person.  BP 106/64   Pulse 81   Temp 97.7 F (36.5 C) (Temporal)   Ht '5\' 5"'$  (1.651 m)   Wt 151 lb 8 oz (68.7 kg)   SpO2 96%   BMI 25.21 kg/m    Orthostatic VS for the past 24 hrs (Last 3 readings):  BP- Sitting BP- Standing at 3 minutes  07/29/21 1106 -- 104/62  07/29/21 1103 106/64 --   CC: 2 mo f/u visit  Subjective:   HPI: [name] is a 75 y.o. female presenting on 07/29/2021 for Follow-up (Here for 2 mo f/u.  Provided copy [to keep] of living will.  Pt accompanied by son, Dellis Filbert- temp 97.7.  )   Recent hospitalization for deep brain stimulator placement for progressive parkinson's disease. Planned DPS activation 08/08/2021 (followed by Drs Carles Collet and Vertell Limber). 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 Now on carbi/levo 25/'100mg'$  1 tab TID with extended release at bedtime, and pramipexole three times daily.   She continues fludrocortisone 0.'1mg'$  daily with improvement in syncope, no further dizziness or lightheadedness. Was unable to tolerate midodrine due to HA, northera was unaffordable.   Brings advanced directive today - wants all children involved in her medical decision making but son [name] to lead decision. Doesn't want prolonged life support if terminal condition.      Relevant past medical, surgical, family and social history reviewed and updated as indicated. Interim medical history since our last visit reviewed. Allergies and medications reviewed and updated. Outpatient Medications Prior to Visit  Medication Sig Dispense Refill   acetaminophen (TYLENOL) 650 MG CR tablet Take 2 tablets (1,300 mg total) by mouth every 8 (eight) hours as needed for pain.     AMBULATORY NON FORMULARY MEDICATION Lift chair Dx:  G20 1 Device 0   Calcium Carbonate-Vitamin D (CALCIUM 600/VITAMIN D) 600-400 MG-UNIT chew tablet Chew 1  tablet by mouth daily.     carbidopa-levodopa (SINEMET IR) 25-100 MG tablet TAKE 1 TABLETS BY MOUTH AT 6 AM ,0.5 TABLET AT 9:30 AM, 0.5 PM AND 4:30 PM. MAY TAKE AN EXTRA ONE-HALF TABLET BY MOUTH AS NEEDED IN THE EVENING (Patient taking differently: Take 0.5-2 tablets by mouth See admin instructions. Take 2 tablets by mouth at 6am, 1 tablet at 9:30am and 4pm, may also take 1/2 tablet between 4pm and 8pm as needed for tremors) 360 tablet 1   Cholecalciferol (VITAMIN D3) 25 MCG (1000 UT) CAPS Take 1 capsule (1,000 Units total) by mouth daily. 30 capsule    clonazePAM (KLONOPIN) 0.5 MG tablet TAKE 1 TABLET BY MOUTH TWICE DAILY (Patient taking differently: Take 0.5 mg by mouth in the morning and at bedtime.) 60 tablet 0   clotrimazole-betamethasone (LOTRISONE) cream Apply 1 application topically daily. (Patient taking differently: Apply 1 application topically daily as needed (irritation).) 30 g 0   cyanocobalamin (,VITAMIN B-12,) 1000 MCG/ML injection INJECT 1 ML (1,000 MCG TOTAL) INTO THE MUSCLE EVERY 30 (THIRTY) DAYS. 3 mL 2   denosumab (PROLIA) 60 MG/ML SOSY injection Inject 60 mg into the skin every 6 (six) months.     fludrocortisone (FLORINEF) 0.1 MG tablet Take 1 tablet (0.1 mg total) by mouth daily. 90 tablet 1   Glycerin-Hypromellose-PEG 400 (ARTIFICIAL TEARS) 0.2-0.2-1 % SOLN Place 1 drop into both eyes daily as needed (dry eyes).     HYDROcodone-acetaminophen (  NORCO/VICODIN) 5-325 MG tablet Take 1 tablet by mouth every 6 (six) hours as needed for moderate pain.     latanoprost (XALATAN) 0.005 % ophthalmic solution Place 1 drop into both eyes at bedtime.     polyethylene glycol (MIRALAX / GLYCOLAX) packet Take 17 g by mouth as needed. (Patient taking differently: Take 17 g by mouth daily as needed (constipation).) 30 each 1   pramipexole (MIRAPEX) 0.5 MG tablet TAKE 1 TABLET BY MOUTH 3  TIMES DAILY (Patient taking differently: Take 0.5 mg by mouth See admin instructions. Take one tablet (0.5 mg) by  mouth three times daily - 8am, 12pm and 6pm) 270 tablet 0   sertraline (ZOLOFT) 100 MG tablet TAKE 1 TABLET BY MOUTH  DAILY (Patient taking differently: Take by mouth every morning.) 90 tablet 3   traMADol (ULTRAM) 50 MG tablet Take 1 tablet (50 mg total) by mouth every 6 (six) hours as needed for moderate pain. 30 tablet 0   No facility-administered medications prior to visit.     Per HPI unless specifically indicated in ROS section below Review of Systems  Objective:  BP 106/64   Pulse 81   Temp 97.7 F (36.5 C) (Temporal)   Ht '5\' 5"'$  (1.651 m)   Wt 151 lb 8 oz (68.7 kg)   SpO2 96%   BMI 25.21 kg/m   Wt Readings from Last 3 Encounters:  07/29/21 151 lb 8 oz (68.7 kg)  07/14/21 155 lb (70.3 kg)  07/07/21 155 lb (70.3 kg)      Physical Exam Vitals and nursing note reviewed.  Constitutional:      Appearance: Normal appearance. She is not ill-appearing.  Cardiovascular:     Rate and Rhythm: Normal rate and regular rhythm.     Pulses: Normal pulses.     Heart sounds: Normal heart sounds. No murmur heard. Pulmonary:     Effort: Pulmonary effort is normal. No respiratory distress.     Breath sounds: Normal breath sounds. No wheezing, rhonchi or rales.  Musculoskeletal:     Right lower leg: No edema.     Left lower leg: No edema.  Skin:    General: Skin is warm and dry.  Neurological:     Mental Status: She is alert.     Comments:  Mild dyskinesia present to arms and head Steady gait with rollator      Results for orders placed or performed during the hospital encounter of 07/21/21  Resp Panel by RT-PCR (Flu A&B, Covid) Nasopharyngeal Swab   Specimen: Nasopharyngeal Swab; Nasopharyngeal(NP) swabs in vial transport medium  Result Value Ref Range   SARS Coronavirus 2 by RT PCR NEGATIVE NEGATIVE   Influenza A by PCR NEGATIVE NEGATIVE   Influenza B by PCR NEGATIVE NEGATIVE  CBC with Differential  Result Value Ref Range   WBC 12.3 (H) 4.0 - 10.5 K/uL   RBC 3.97 3.87 -  5.11 MIL/uL   Hemoglobin 11.1 (L) 12.0 - 15.0 g/dL   HCT 36.1 36.0 - 46.0 %   MCV 90.9 80.0 - 100.0 fL   MCH 28.0 26.0 - 34.0 pg   MCHC 30.7 30.0 - 36.0 g/dL   RDW 14.6 11.5 - 15.5 %   Platelets 404 (H) 150 - 400 K/uL   nRBC 0.0 0.0 - 0.2 %   Neutrophils Relative % 68 %   Neutro Abs 8.5 (H) 1.7 - 7.7 K/uL   Lymphocytes Relative 20 %   Lymphs Abs 2.4 0.7 - 4.0 K/uL  Monocytes Relative 9 %   Monocytes Absolute 1.1 (H) 0.1 - 1.0 K/uL   Eosinophils Relative 2 %   Eosinophils Absolute 0.2 0.0 - 0.5 K/uL   Basophils Relative 1 %   Basophils Absolute 0.1 0.0 - 0.1 K/uL   Immature Granulocytes 0 %   Abs Immature Granulocytes 0.04 0.00 - 0.07 K/uL  Comprehensive metabolic panel  Result Value Ref Range   Sodium 140 135 - 145 mmol/L   Potassium 4.2 3.5 - 5.1 mmol/L   Chloride 105 98 - 111 mmol/L   CO2 24 22 - 32 mmol/L   Glucose, Bld 89 70 - 99 mg/dL   BUN 24 (H) 8 - 23 mg/dL   Creatinine, Ser 0.71 0.44 - 1.00 mg/dL   Calcium 9.5 8.9 - 10.3 mg/dL   Total Protein 6.5 6.5 - 8.1 g/dL   Albumin 3.9 3.5 - 5.0 g/dL   AST 27 15 - 41 U/L   ALT <5 0 - 44 U/L   Alkaline Phosphatase 65 38 - 126 U/L   Total Bilirubin 1.4 (H) 0.3 - 1.2 mg/dL   GFR, Estimated >60 >60 mL/min   Anion gap 11 5 - 15  CK  Result Value Ref Range   Total CK 530 (H) 38 - 234 U/L  CBC  Result Value Ref Range   WBC 12.3 (H) 4.0 - 10.5 K/uL   RBC 4.32 3.87 - 5.11 MIL/uL   Hemoglobin 11.9 (L) 12.0 - 15.0 g/dL   HCT 38.9 36.0 - 46.0 %   MCV 90.0 80.0 - 100.0 fL   MCH 27.5 26.0 - 34.0 pg   MCHC 30.6 30.0 - 36.0 g/dL   RDW 14.9 11.5 - 15.5 %   Platelets 399 150 - 400 K/uL   nRBC 0.0 0.0 - 0.2 %  Comprehensive metabolic panel  Result Value Ref Range   Sodium 142 135 - 145 mmol/L   Potassium 3.4 (L) 3.5 - 5.1 mmol/L   Chloride 108 98 - 111 mmol/L   CO2 24 22 - 32 mmol/L   Glucose, Bld 100 (H) 70 - 99 mg/dL   BUN 21 8 - 23 mg/dL   Creatinine, Ser 0.68 0.44 - 1.00 mg/dL   Calcium 9.4 8.9 - 10.3 mg/dL   Total  Protein 7.0 6.5 - 8.1 g/dL   Albumin 3.8 3.5 - 5.0 g/dL   AST 31 15 - 41 U/L   ALT 6 0 - 44 U/L   Alkaline Phosphatase 70 38 - 126 U/L   Total Bilirubin 0.6 0.3 - 1.2 mg/dL   GFR, Estimated >60 >60 mL/min   Anion gap 10 5 - 15  CK  Result Value Ref Range   Total CK 1,464 (H) 38 - 234 U/L  CK  Result Value Ref Range   Total CK 843 (H) 38 - 234 U/L   *Note: Due to a large number of results and/or encounters for the requested time period, some results have not been displayed. A complete set of results can be found in Results Review.    Assessment & Plan:  This visit occurred during the SARS-CoV-2 public health emergency.  Safety protocols were in place, including screening questions prior to the visit, additional usage of staff PPE, and extensive cleaning of exam room while observing appropriate contact time as indicated for disinfecting solutions.   Problem List Items Addressed This Visit     Advanced care planning/counseling discussion (Chronic)    Advanced directive - brings forms today will be scanned -  wants all children involved in her medical decision making but son [name] to lead decision. Doesn't want prolonged life support if terminal condition.       Parkinson's disease ([place]) - Primary (Chronic)    Appreciate neurology and neurosurgery excellent care, s/p recent DBS placement, planning to activate 08/08/2021.  Stable period on current levodopa regimen - continue.       Vitamin B12 deficiency    B12 shot today. Continue monthly replacement.       Orthostatic hypotension    Continues fludrocortisone 0.'1mg'$  daily with benefit.         Meds ordered this encounter  Medications   cyanocobalamin ((VITAMIN B-12)) injection 1,000 mcg   No orders of the defined types were placed in this encounter.    Patient Instructions  Good to see you today. You are doing well.  Return as needed or in 3 months at previously scheduled visit.   Follow up plan: Return if symptoms  worsen or fail to improve.  [name], MD

## 2021-07-29 NOTE — Discharge Summary (Signed)
Physician Discharge Summary  [name] [id number] DOB: [birth year] DOA: 07/21/2021  PCP: [name], MD  Admit date: 07/21/2021 Discharge date: 07/25/2021  Admitted From: Home.  Disposition:  Home.   Recommendations for Outpatient Follow-up:  Follow up with PCP in 1-2 weeks Please obtain BMP/CBC in one week Please follow up with neurology as recommended.     Discharge Condition:stable.  CODE STATUS:full code.  Diet recommendation: Heart Healthy   Brief/Interim Summary: [name] is a 75 y.o. female with history of Parkinson's disease who had a deep brain stimulator placed about 2 weeks ago started having abdominal movement since last evening which was persistent and was brought to the ER.   Neurosurgery and neurology consulted.  Her dyskinesia probably secondary to medications and probe.  As per the recommendations, decreased the dose on discharge.   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 [facility]) Active Problems:   Parkinson's disease [facility])   Chorea   Dyskinesia    Discharge Instructions  Discharge Instructions     Diet - low sodium heart healthy   Complete by: As directed    Diet - low sodium heart healthy   Complete by: As directed    Increase activity slowly   Complete by: As directed    Increase activity slowly   Complete by: As directed    No wound care   Complete by: As directed    No wound care   Complete by: As directed       Allergies as of 07/23/2021       Reactions   Amitriptyline Other (See Comments)   Sedated next morning   Ciprofloxacin Nausea And Vomiting   Cymbalta [duloxetine Hcl] Other (See Comments)   Worsened depression   Iohexol Other (See Comments)    Code: HIVES, Desc: PT developed 2 hives, followed by SOB, severe headache post 87cc's Omnipaque 300., Onset Date: [id number]   Lyrica [pregabalin] Other (See Comments)   Tried during hospitalization - unsure effects but unable to  tolerate   Imipramine Hcl Rash   Iodine Rash   Lidocaine Hcl Rash   Morphine Sulfate Rash   Neosporin [neomycin-bacitracin Zn-polymyx] Rash, Other (See Comments)   Worsened skin breaking out   Sulfamethoxazole Rash   Tetracyclines & Related Rash        Medication List     STOP taking these medications    nystatin-triamcinolone ointment Commonly known as: MYCOLOG       TAKE these medications    acetaminophen 650 MG CR tablet Commonly known as: TYLENOL Take 2 tablets (1,300 mg total) by mouth every 8 (eight) hours as needed for pain.   AMBULATORY NON FORMULARY MEDICATION Lift chair Dx:  G20   Artificial Tears 0.2-0.2-1 % Soln Generic drug: Glycerin-Hypromellose-PEG 400 Place 1 drop into both eyes daily as needed (dry eyes).   Calcium Carbonate-Vitamin D 600-400 MG-UNIT chew tablet Commonly known as: Calcium 600/Vitamin D Chew 1 tablet by mouth daily.   carbidopa-levodopa 25-100 MG tablet Commonly known as: SINEMET IR TAKE 1 TABLETS BY MOUTH AT 6 AM ,0.5 TABLET AT 9:30 AM, 0.5 PM AND 4:30 PM. MAY TAKE AN EXTRA ONE-HALF TABLET BY MOUTH AS NEEDED IN THE EVENING What changed:  additional instructions Another medication with the same name was removed. Continue taking this medication, and follow the directions you see here.   clonazePAM 0.5 MG tablet Commonly known as: KLONOPIN TAKE 1 TABLET BY MOUTH TWICE DAILY What changed: when to  take this   clotrimazole-betamethasone cream Commonly known as: LOTRISONE Apply 1 application topically daily. What changed:  when to take this reasons to take this   cyanocobalamin 1000 MCG/ML injection Commonly known as: (VITAMIN B-12) INJECT 1 ML (1,000 MCG TOTAL) INTO THE MUSCLE EVERY 30 (THIRTY) DAYS.   denosumab 60 MG/ML Sosy injection Commonly known as: PROLIA Inject 60 mg into the skin every 6 (six) months.   fludrocortisone 0.1 MG tablet Commonly known as: FLORINEF Take 1 tablet (0.1 mg total) by mouth daily.    HYDROcodone-acetaminophen 5-325 MG tablet Commonly known as: NORCO/VICODIN Take 1 tablet by mouth every 6 (six) hours as needed for moderate pain.   latanoprost 0.005 % ophthalmic solution Commonly known as: XALATAN Place 1 drop into both eyes at bedtime.   polyethylene glycol 17 g packet Commonly known as: MIRALAX / GLYCOLAX Take 17 g by mouth as needed. What changed:  when to take this reasons to take this   pramipexole 0.5 MG tablet Commonly known as: MIRAPEX TAKE 1 TABLET BY MOUTH 3  TIMES DAILY What changed:  when to take this additional instructions   sertraline 100 MG tablet Commonly known as: ZOLOFT TAKE 1 TABLET BY MOUTH  DAILY What changed:  how much to take when to take this   traMADol 50 MG tablet Commonly known as: ULTRAM Take 1 tablet (50 mg total) by mouth every 6 (six) hours as needed for moderate pain.   Vitamin D3 25 MCG (1000 UT) Caps Take 1 capsule (1,000 Units total) by mouth daily.        Follow-up Information     [name], MD .   Specialty: [facility] Contact information: [place] [address] [phone]         [name], MD Follow up.   Specialty: Family Medicine Contact information: [place] [address] [phone]                Allergies  Allergen Reactions   Amitriptyline Other (See Comments)    Sedated next morning   Ciprofloxacin Nausea And Vomiting   Cymbalta [Duloxetine Hcl] Other (See Comments)    Worsened depression   Iohexol Other (See Comments)     Code: HIVES, Desc: PT developed 2 hives, followed by SOB, severe headache post 87cc's Omnipaque 300., Onset Date: [id number]    Lyrica [Pregabalin] Other (See Comments)    Tried during hospitalization - unsure effects but unable to tolerate   Imipramine Hcl Rash   Iodine Rash   Lidocaine Hcl Rash   Morphine Sulfate Rash   Neosporin [Neomycin-Bacitracin Zn-Polymyx] Rash and Other (See  Comments)    Worsened skin breaking out   Sulfamethoxazole Rash   Tetracyclines & Related Rash    Consultations: Neurology  Neuro surgery.    Procedures/Studies: CT HEAD WO CONTRAST (5MM)  Result Date: 07/22/2021 CLINICAL DATA:  History of recent deep brain stimulator placement EXAM: CT HEAD WITHOUT CONTRAST TECHNIQUE: Contiguous axial images were obtained from the base of the skull through the vertex without intravenous contrast. COMPARISON:  07/08/2021 FINDINGS: Brain: Deep brain stimulators are again identified extending towards the subthalamic regions bilaterally. No findings to suggest acute hemorrhage, acute infarction or space-occupying mass lesion are seen. The previously seen pneumocephalus has resolved in the interval. Vascular: No hyperdense vessel or unexpected calcification. Skull: Normal. Negative for fracture or focal lesion. Sinuses/Orbits: No acute finding. Other: None IMPRESSION: Status post deep brain stimulator placement unchanged from the  prior exam. No acute abnormality noted. Electronically Signed   By: Inez Catalina M.D.   On: 07/22/2021 01:58   CT DBS HEAD W/O CONTRAST (1MM)  Result Date: 07/08/2021 CLINICAL DATA:  75 year old female postoperative day 1 status post bilateral deep brain stimulator placement. EXAM: CT HEAD WITHOUT CONTRAST TECHNIQUE: Contiguous axial images were obtained from the base of the skull through the vertex without intravenous contrast. COMPARISON:  Postoperative head CT 2002 hours yesterday. Preoperative head CT 06/30/2021 and earlier. FINDINGS: Brain: Thin slice axial images today. Small volume of postoperative pneumocephalus is unchanged since last night. Bilateral vertex approach DBS leads terminates at the subthalamic regions as seen on coronal image 54. No acute intracranial hemorrhage identified. No midline shift, mass effect, or evidence of intracranial mass lesion. No ventriculomegaly. Stable gray-white matter differentiation throughout the  brain. No cortically based acute infarct identified. Vascular: No suspicious intracranial vascular hyperdensity. Skull: Stable vertex burr holes. No acute osseous abnormality identified. Sinuses/Orbits: Visualized paranasal sinuses and mastoids are stable and well aerated. Other: Stable postoperative appearance to the scalp with multifocal skin staples and bilateral vertex electrodes which then continue inferiorly on the right, but terminate above the occipital scalp. Stable, negative orbits. IMPRESSION: 1. Bilateral deep brain stimulator placed with no adverse features identified. Mild postoperative pneumocephalus is stable since last night. 2. Scalp electrical leads, currently terminated along the posterior right convexity. Electronically Signed   By: Genevie Ann M.D.   On: 07/08/2021 08:55   CT DBS HEAD W/O CONTRAST (1MM)  Result Date: 07/07/2021 CLINICAL DATA:  Status post craniotomy for deep brain stimulator placement. EXAM: CT HEAD WITHOUT CONTRAST TECHNIQUE: Contiguous axial images were obtained from the base of the skull through the vertex without intravenous contrast. COMPARISON:  06/30/2021 FINDINGS: Brain: Postoperative changes are seen from interval deep brain stimulator placement with electrodes terminating in the subthalamic regions bilaterally. There is small volume pneumocephalus bilaterally primarily over the anterior frontal convexities. No acute infarct, intracranial hemorrhage, mass, midline shift, or sizable extra-axial fluid collection is identified. The ventricles are normal in size. Vascular: Calcified atherosclerosis at the skull base. Skull: Postoperative changes with bilateral frontal burr holes and mild swelling and gas involving the scalp soft tissues with skin staples in place. Sinuses/Orbits: Mild mucosal thickening in the maxillary sinuses. Clear mastoid air cells. Bilateral cataract extraction. Other: None. IMPRESSION: Postoperative changes from deep brain stimulator placement.  Electronically Signed   By: Logan Bores M.D.   On: 07/07/2021 20:28   CT DBS HEAD W/O CONTRAST (1MM)  Result Date: 06/30/2021 CLINICAL DATA:  pre op EXAM: CT HEAD WITHOUT CONTRAST TECHNIQUE: Contiguous axial images were obtained from the base of the skull through the vertex without intravenous contrast. COMPARISON:  CT head May 16, 2021. FINDINGS: Brain: Limited preoperative planning CT. Now evidence of acute large vascular territory infarct, acute hemorrhage, hydrocephalus, mass lesion, midline shift, or extra-axial fluid collection. Vascular: No hyperdense vessel identified. Calcific intracranial atherosclerosis. Skull: No acute fracture. Sinuses/Orbits: Mild mucosal thickening of the inferior maxillary sinuses and scattered ethmoid air cells. No air-fluid levels. No acute orbital findings. Other: No mastoid effusions. IMPRESSION: Limited preoperative planning CT without evidence of acute abnormality. Electronically Signed   By: Margaretha Sheffield MD   On: 06/30/2021 19:06     Subjective: No new complaints.   Discharge Exam: Vitals:   07/23/21 0921 07/23/21 1327  BP: 121/62 131/70  Pulse: 86 83  Resp:    Temp: 98.3 F (36.8 C) 98.1 F (36.7 C)  SpO2:  95% 94%   Vitals:   07/23/21 0351 07/23/21 0921 07/23/21 1226 07/23/21 1327  BP: 126/70 121/62  131/70  Pulse: 82 86  83  Resp: 16     Temp: 97.8 F (36.6 C) 98.3 F (36.8 C)  98.1 F (36.7 C)  TempSrc: Oral Oral  Oral  SpO2: 95% 95%  94%  Height:   '5\' 5"'$  (1.651 m)     General: Pt is alert, awake, not in acute distress Cardiovascular: RRR, S1/S2 +, no rubs, no gallops Respiratory: CTA bilaterally, no wheezing, no rhonchi Abdominal: Soft, NT, ND, bowel sounds + Extremities: no edema, no cyanosis    The results of significant diagnostics from this hospitalization (including imaging, microbiology, ancillary and laboratory) are listed below for reference.     Microbiology: Recent Results (from the past 240 hour(s))  Resp  Panel by RT-PCR (Flu A&B, Covid) Nasopharyngeal Swab     Status: None   Collection Time: 07/22/21  2:28 AM   Specimen: Nasopharyngeal Swab; Nasopharyngeal(NP) swabs in vial transport medium  Result Value Ref Range Status   SARS Coronavirus 2 by RT PCR NEGATIVE NEGATIVE Final    Comment: (NOTE) SARS-CoV-2 target nucleic acids are NOT DETECTED.  The SARS-CoV-2 RNA is generally detectable in upper respiratory specimens during the acute phase of infection. The lowest concentration of SARS-CoV-2 viral copies this assay can detect is 138 copies/mL. A negative result does not preclude SARS-Cov-2 infection and should not be used as the sole basis for treatment or other patient management decisions. A negative result may occur with  improper specimen collection/handling, submission of specimen other than nasopharyngeal swab, presence of viral mutation(s) within the areas targeted by this assay, and inadequate number of viral copies(<138 copies/mL). A negative result must be combined with clinical observations, patient history, and epidemiological information. The expected result is Negative.  Fact Sheet for Patients:  EntrepreneurPulse.com.au  Fact Sheet for Healthcare Providers:  IncredibleEmployment.be  This test is no t yet approved or cleared by the [place] FDA and  has been authorized for detection and/or diagnosis of SARS-CoV-2 by FDA under an Emergency Use Authorization (EUA). This EUA will remain  in effect (meaning this test can be used) for the duration of the COVID-19 declaration under Section 564(b)(1) of the Act, 21 U.S.C.section 360bbb-3(b)(1), unless the authorization is terminated  or revoked sooner.       Influenza A by PCR NEGATIVE NEGATIVE Final   Influenza B by PCR NEGATIVE NEGATIVE Final    Comment: (NOTE) The Xpert Xpress SARS-CoV-2/FLU/RSV plus assay is intended as an aid in the diagnosis of influenza from Nasopharyngeal  swab specimens and should not be used as a sole basis for treatment. Nasal washings and aspirates are unacceptable for Xpert Xpress SARS-CoV-2/FLU/RSV testing.  Fact Sheet for Patients: EntrepreneurPulse.com.au  Fact Sheet for Healthcare Providers: IncredibleEmployment.be  This test is not yet approved or cleared by the [place] FDA and has been authorized for detection and/or diagnosis of SARS-CoV-2 by FDA under an Emergency Use Authorization (EUA). This EUA will remain in effect (meaning this test can be used) for the duration of the COVID-19 declaration under Section 564(b)(1) of the Act, 21 U.S.C. section 360bbb-3(b)(1), unless the authorization is terminated or revoked.  Performed at [facility], [place] [address], [place], [place] [address]      Labs: BNP (last 3 results) No results for input(s): BNP in the last 8760 hours. Basic Metabolic Panel: No results for input(s): NA, K, CL, CO2, GLUCOSE,  BUN, CREATININE, CALCIUM, MG, PHOS in the last 168 hours. Liver Function Tests: No results for input(s): AST, ALT, ALKPHOS, BILITOT, PROT, ALBUMIN in the last 168 hours. No results for input(s): LIPASE, AMYLASE in the last 168 hours. No results for input(s): AMMONIA in the last 168 hours. CBC: No results for input(s): WBC, NEUTROABS, HGB, HCT, MCV, PLT in the last 168 hours. Cardiac Enzymes: No results for input(s): CKTOTAL, CKMB, CKMBINDEX, TROPONINI in the last 168 hours. BNP: Invalid input(s): POCBNP CBG: No results for input(s): GLUCAP in the last 168 hours. D-Dimer No results for input(s): DDIMER in the last 72 hours. Hgb A1c No results for input(s): HGBA1C in the last 72 hours. Lipid Profile No results for input(s): CHOL, HDL, LDLCALC, TRIG, CHOLHDL, LDLDIRECT in the last 72 hours. Thyroid function studies No results for input(s): TSH, T4TOTAL, T3FREE, THYROIDAB in the last 72 hours.  Invalid input(s): FREET3 Anemia  work up No results for input(s): VITAMINB12, FOLATE, FERRITIN, TIBC, IRON, RETICCTPCT in the last 72 hours. Urinalysis    Component Value Date/Time   COLORURINE YELLOW 05/16/2021 1434   APPEARANCEUR HAZY (A) 05/16/2021 1434   LABSPEC 1.005 05/16/2021 1434   PHURINE 7.0 05/16/2021 1434   GLUCOSEU 50 (A) 05/16/2021 1434   HGBUR SMALL (A) 05/16/2021 1434   HGBUR trace-lysed 09/02/2010 0954   BILIRUBINUR NEGATIVE 05/16/2021 1434   BILIRUBINUR negative 02/07/2021 1420   KETONESUR 5 (A) 05/16/2021 1434   PROTEINUR NEGATIVE 05/16/2021 1434   UROBILINOGEN 0.2 02/07/2021 1420   UROBILINOGEN 0.2 09/04/2010 1342   NITRITE POSITIVE (A) 05/16/2021 1434   LEUKOCYTESUR SMALL (A) 05/16/2021 1434   Sepsis Labs Invalid input(s): PROCALCITONIN,  WBC,  LACTICIDVEN Microbiology Recent Results (from the past 240 hour(s))  Resp Panel by RT-PCR (Flu A&B, Covid) Nasopharyngeal Swab     Status: None   Collection Time: 07/22/21  2:28 AM   Specimen: Nasopharyngeal Swab; Nasopharyngeal(NP) swabs in vial transport medium  Result Value Ref Range Status   SARS Coronavirus 2 by RT PCR NEGATIVE NEGATIVE Final    Comment: (NOTE) SARS-CoV-2 target nucleic acids are NOT DETECTED.  The SARS-CoV-2 RNA is generally detectable in upper respiratory specimens during the acute phase of infection. The lowest concentration of SARS-CoV-2 viral copies this assay can detect is 138 copies/mL. A negative result does not preclude SARS-Cov-2 infection and should not be used as the sole basis for treatment or other patient management decisions. A negative result may occur with  improper specimen collection/handling, submission of specimen other than nasopharyngeal swab, presence of viral mutation(s) within the areas targeted by this assay, and inadequate number of viral copies(<138 copies/mL). A negative result must be combined with clinical observations, patient history, and epidemiological information. The expected result  is Negative.  Fact Sheet for Patients:  EntrepreneurPulse.com.au  Fact Sheet for Healthcare Providers:  IncredibleEmployment.be  This test is no t yet approved or cleared by the [place] FDA and  has been authorized for detection and/or diagnosis of SARS-CoV-2 by FDA under an Emergency Use Authorization (EUA). This EUA will remain  in effect (meaning this test can be used) for the duration of the COVID-19 declaration under Section 564(b)(1) of the Act, 21 U.S.C.section 360bbb-3(b)(1), unless the authorization is terminated  or revoked sooner.       Influenza A by PCR NEGATIVE NEGATIVE Final   Influenza B by PCR NEGATIVE NEGATIVE Final    Comment: (NOTE) The Xpert Xpress SARS-CoV-2/FLU/RSV plus assay is intended as an aid in the diagnosis of influenza  from Nasopharyngeal swab specimens and should not be used as a sole basis for treatment. Nasal washings and aspirates are unacceptable for Xpert Xpress SARS-CoV-2/FLU/RSV testing.  Fact Sheet for Patients: EntrepreneurPulse.com.au  Fact Sheet for Healthcare Providers: IncredibleEmployment.be  This test is not yet approved or cleared by the [place] FDA and has been authorized for detection and/or diagnosis of SARS-CoV-2 by FDA under an Emergency Use Authorization (EUA). This EUA will remain in effect (meaning this test can be used) for the duration of the COVID-19 declaration under Section 564(b)(1) of the Act, 21 U.S.C. section 360bbb-3(b)(1), unless the authorization is terminated or revoked.  Performed at [facility], [place] [address], [place], [place] [address]      Time coordinating discharge: 34 minutes.   SIGNED:   Hosie Poisson, MD  [facility] 07/29/2021, 5:15 PM

## 2021-08-05 NOTE — Progress Notes (Signed)
Assessment/Plan:    1.  Parkinsons Disease             -For right now, she will continue carbidopa/levodopa 25/100, 1 tablet 3 times per day.  She will let me know if she gets excess dyskinesia and was told if she did to only take 1/2 the tablet.             -Continue carbidopa/levodopa 50/200 at bedtime             -Decrease pramipexole, 0.5 mg, half tablet 3 times per day for a week and then half tablet twice a day for a week and then half tablet once a day for a week and then discontinue pramipexole             -Patient completed neurocognitive testing on January 25, 2021 with Dr. Nicole Kindred.  Evidence of MCI only.  -Patient is status post bilateral STN DBS on July 07, 2021 with IPG placement on July 14, 2021.  Device activated today.  -Patient and family were shown how to use her patient controller.    2.  Dyskinesia             -Now off of amantadine because of hallucinations.             -Discontinuing pramipexole today. 3.  Neurogenic Orthostatic Hypotension             -Did well on Northera.  Patient stopped that on her own.  Now on Florinef 0.1 mg daily 4.  Depression             -On sertraline             -Declines counseling 5.  b12 deficiency             -on injections. 6.  Urinary incontinence             -on myrbetriq.  She is doing well in that regard 7.  Lumbar radiculopathy             -She is status post lumbar foraminotomy with Dr. Vertell Limber on November 27, 2019 9.  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  My previous records were reviewed prior to todays visit as well as outside records available to me. Pt with son who supplements hx. Other son from [place] comes in at end of visit.   Pt underwent bilateral STN DBS on July 07, 2021 with IPG placement on July 14, 2021.  She ended up in the hospital after that with  dyskinesia.  It sounds like she took extra levodopa and went to the hospital with severe dyskinesia.  They had given her Versed.  That helped the dyskinesia, but obviously caused her to go to sleep.  When I got a call, I significantly dropped down her levodopa.  She ended up emailing me from home and stated that she was now too stiff.  We did increase her levodopa a little bit after that.  She has followed up with primary care since then.  Advanced directives were completed.  Current prescribed movement disorder medications: carbidopa/levodopa 25/100, 1 tablet 3 times per day (prior to/1/1) Carbidopa/levodopa 50/200 at bedtime Pramipexole 0.5 mg 3 times per day Sertraline, 100 mg daily Florinef 0.1 mg daily  PREVIOUS MEDICATIONS: northera (worked well but pt d/c when they quit sending b/c she owed $); amantadine (discontinued because of hallucinations); pramipexole (decreased in past because of hallucinations/dyskinesia)  ALLERGIES:   Allergies  Allergen Reactions   Amitriptyline Other (See Comments)    Sedated next morning   Ciprofloxacin Nausea And Vomiting   Cymbalta [Duloxetine Hcl] Other (See Comments)    Worsened depression   Iohexol Other (See Comments)     Code: HIVES, Desc: PT developed 2 hives, followed by SOB, severe headache post 87cc's Omnipaque 300., Onset Date: [id number]    Lyrica [Pregabalin] Other (See Comments)    Tried during hospitalization - unsure effects but unable to tolerate   Imipramine Hcl Rash   Iodine Rash   Lidocaine Hcl Rash   Morphine Sulfate Rash   Neosporin [Neomycin-Bacitracin Zn-Polymyx] Rash and Other (See Comments)    Worsened skin breaking out   Sulfamethoxazole Rash   Tetracyclines & Related Rash    CURRENT MEDICATIONS:  Outpatient Encounter Medications as of 08/08/2021  Medication Sig   acetaminophen (TYLENOL) 650 MG CR tablet Take 2 tablets (1,300 mg total) by mouth every 8 (eight) hours as needed for pain.   AMBULATORY NON FORMULARY  MEDICATION Lift chair Dx:  G20   Calcium Carbonate-Vitamin D (CALCIUM 600/VITAMIN D) 600-400 MG-UNIT chew tablet Chew 1 tablet by mouth daily.   carbidopa-levodopa (SINEMET IR) 25-100 MG tablet TAKE 1 TABLETS BY MOUTH AT 6 AM ,0.5 TABLET AT 9:30 AM, 0.5 PM AND 4:30 PM. MAY TAKE AN EXTRA ONE-HALF TABLET BY MOUTH AS NEEDED IN THE EVENING (Patient taking differently: Take 0.5-2 tablets by mouth See admin instructions. Take 2 tablets by mouth at 6am, 1 tablet at 9:30am and 4pm, may also take 1/2 tablet between 4pm and 8pm as needed for tremors)   Cholecalciferol (VITAMIN D3) 25 MCG (1000 UT) CAPS Take 1 capsule (1,000 Units total) by mouth daily.   clonazePAM (KLONOPIN) 0.5 MG tablet TAKE 1 TABLET BY MOUTH TWICE DAILY (Patient taking differently: Take 0.5 mg by mouth in the morning and at bedtime.)   clotrimazole-betamethasone (LOTRISONE) cream Apply 1 application topically daily. (Patient taking differently: Apply 1 application topically daily as needed (irritation).)   cyanocobalamin (,VITAMIN B-12,) 1000 MCG/ML injection INJECT 1 ML (1,000 MCG TOTAL) INTO THE MUSCLE EVERY 30 (THIRTY) DAYS.   denosumab (PROLIA) 60 MG/ML SOSY injection Inject 60 mg into the skin every 6 (six) months.   fludrocortisone (FLORINEF) 0.1 MG tablet Take 1 tablet (0.1 mg total) by mouth daily.   Glycerin-Hypromellose-PEG 400 (ARTIFICIAL TEARS) 0.2-0.2-1 % SOLN Place 1 drop into both eyes daily as needed (dry eyes).   HYDROcodone-acetaminophen (NORCO/VICODIN) 5-325 MG tablet Take 1 tablet by mouth every 6 (six) hours as needed for moderate pain.   latanoprost (XALATAN) 0.005 % ophthalmic solution Place 1 drop into both eyes at bedtime.   polyethylene glycol (MIRALAX / GLYCOLAX) packet Take 17 g by mouth as needed. (Patient taking differently: Take 17 g by mouth daily as needed (constipation).)   sertraline (ZOLOFT) 100 MG tablet TAKE 1 TABLET BY MOUTH  DAILY (Patient taking differently: Take by mouth every morning.)   traMADol  (ULTRAM) 50 MG tablet Take 1 tablet (50 mg total) by mouth every 6 (six) hours as needed for moderate pain.   [DISCONTINUED] pramipexole (MIRAPEX) 0.5 MG tablet TAKE 1 TABLET BY MOUTH 3  TIMES DAILY (Patient taking differently: Take 0.5 mg by mouth See admin instructions. Take one tablet (0.5 mg) by mouth three times daily -  8am, 12pm and 6pm)   No facility-administered encounter medications on file as of 08/08/2021.    Objective:   PHYSICAL EXAMINATION:    VITALS:   Vitals:   08/08/21 1251  BP: 139/70  Pulse: 81  SpO2: 97%  Weight: 151 lb 12.8 oz (68.9 kg)    GEN:  The patient appears stated age and is in NAD. HEENT:  Normocephalic, atraumatic.  The mucous membranes are moist. The superficial temporal arteries are without ropiness or tenderness. CV:  RRR Lungs:  CTAB Neck/HEME:  There are no carotid bruits bilaterally.  Neurological examination:  Orientation: The patient is alert and oriented x3. Cranial nerves: There is good facial symmetry. The speech is fluent and clear. Soft palate rises symmetrically and there is no tongue deviation. Hearing is intact to conversational tone. Sensation: Sensation is intact to light touch throughout Motor: Strength is 5/5 in the bilateral upper and lower extremities.   Shoulder shrug is equal and symmetric.  There is no pronator drift.  Movement examination: Tone: There is normal tone in the upper and lower extremities following activation of the device.  Prior to activation, there is moderate increased tone in the right and mild on the left. Abnormal movements: Prior to activation of the device, she had moderate amplitude bilateral upper extremity rest tremor and mild bilateral lower extremity rest tremor, right greater than left.  Following activation, there was occasional rest tremor in both hands. Coordination:  There is marked decremation with RAM's, bilaterally prior to activation of the device.  Following activation, symptoms were  markedly improved on both sides. Gait and Station: The patient required assistance out of the chair prior to activation of the device.  Prior to activation, she was very short stepped and had significant difficulty walking, even with a walker.  Following activation, she was able to get out of the chair by herself by pushing off the chair.  She was short stepped, but markedly improved.      Chemistry      Component Value Date/Time   NA 142 07/22/2021 0431   K 3.4 (L) 07/22/2021 0431   CL 108 07/22/2021 0431   CO2 24 07/22/2021 0431   BUN 21 07/22/2021 0431   CREATININE 0.68 07/22/2021 0431   CREATININE 0.87 10/08/2019 0921      Component Value Date/Time   CALCIUM 9.4 07/22/2021 0431   ALKPHOS 70 07/22/2021 0431   AST 31 07/22/2021 0431   ALT 6 07/22/2021 0431   BILITOT 0.6 07/22/2021 0431       Lab Results  Component Value Date   WBC 12.3 (H) 07/22/2021   HGB 11.9 (L) 07/22/2021   HCT 38.9 07/22/2021   MCV 90.0 07/22/2021   PLT 399 07/22/2021    Lab Results  Component Value Date   TSH 0.49 10/04/2018    Total time spent on today's visit was 20 minutes, including both face-to-face time and nonface-to-face time.  Time included that spent on review of records (prior notes available to me/labs/imaging if pertinent), discussing treatment and goals, answering patient's questions and coordinating care.  This did not include the time spent activating the device, which is documented separately.   Cc:  [name], MD

## 2021-08-08 ENCOUNTER — Encounter: Payer: Self-pay | Admitting: Neurology

## 2021-08-08 ENCOUNTER — Other Ambulatory Visit: Payer: Self-pay

## 2021-08-08 ENCOUNTER — Ambulatory Visit: Payer: Medicare Other | Admitting: Neurology

## 2021-08-08 DIAGNOSIS — G20 Parkinson's disease: Secondary | ICD-10-CM | POA: Diagnosis not present

## 2021-08-08 NOTE — Patient Instructions (Signed)
-  Decrease pramipexole, 0.5 mg, half tablet 3 times per day for a week and then half tablet twice a day for a week and then half tablet once a day for a week and then discontinue pramipexole

## 2021-08-08 NOTE — Procedures (Signed)
DBS Programming was performed.    Manufacturer of DBS device: [employer], bluetooth  Total time spent programming was 75 minutes.  Device was confirmed to be on.  Soft start was confirmed to be on.  Impedences were checked and were within normal limits.  Battery was checked and was determined to be functioning normally and not near the end of life.  Final settings were as follows:    Active Contact Amplitude (mA) PW (ms) Frequency (hz) Side Effects  Left Brain       08/08/21 3-C+ 1.7 60 130   Other trials        1-C+ 2.7 60 130 tremor   (2/3/4)-C+ 3.0 60 130 Face pull above 2.1   (5/6/7)-C+ 1.8 60 130 ? Face pull (some baseline facial asymmetry)                                            Right Brain       08/08/21 (5/6/7)-C+ 1.6 60 130   Other trials 1-C+ 1.5 60 130 ? Speech change   (2/3/4)-C+ 1.5 60 130 Pretty good   8-C+ 1.6 60 130 tremor

## 2021-08-10 ENCOUNTER — Other Ambulatory Visit: Payer: Self-pay | Admitting: Family Medicine

## 2021-08-10 NOTE — Telephone Encounter (Signed)
ERx 

## 2021-08-16 NOTE — Telephone Encounter (Signed)
Spoke with patient today and discussed cost and scheduling for Prolia. Labs were done on 07/22/21-ok to use those results. Calcium was normal 9.4 Cr Cl 80.14 mL/min.  Nurse Visit scheduled on 08/30/21 for Prolia, B12 and Flu shot-discussed with Dr Diona Browner that it was safe to get all 3 at the same time as ling as patient has not had side effects with any of these injections in the past.

## 2021-08-17 ENCOUNTER — Telehealth: Payer: Self-pay | Admitting: Neurology

## 2021-08-17 DIAGNOSIS — H401131 Primary open-angle glaucoma, bilateral, mild stage: Secondary | ICD-10-CM | POA: Diagnosis not present

## 2021-08-17 NOTE — Telephone Encounter (Signed)
Pt called, said she missed two calls, no VM, and she doesn't know what it is about. I let her know I see where she has been talking to [name] on her mychart, she said yes, but she doesn't know what the calls are for.

## 2021-08-18 NOTE — Progress Notes (Signed)
Assessment/Plan:    1.  Parkinsons Disease  -Stop pramipexole             -Patient completed neurocognitive testing on January 25, 2021 with Dr. Nicole Kindred.  Evidence of MCI only.  -Patient is status post bilateral STN DBS on July 07, 2021 with IPG placement on July 14, 2021.    -told she could take extra 1/2 levodopa prn but not on regular basis right now  -discussed balance between dyskinesia and tremor.  She expressed understanding 2.  Dyskinesia             -improved post programming 3.  Neurogenic Orthostatic Hypotension             -Did well on Northera.  Patient stopped that on her own.  Now on Florinef 0.1 mg daily 4.  Depression             -On sertraline             -Declines counseling 5.  b12 deficiency             -on injections. 6.  Urinary incontinence             -on myrbetriq.  She is doing well in that regard 7.  Lumbar radiculopathy             -She is status post lumbar foraminotomy with Dr. Vertell Limber on November 27, 2019  -having pain again and to f/u Dr. Vertell Limber 8.  F/u 2 weeks   Subjective:   [name] was seen today in follow up for levodopa challenge.  My previous records were reviewed prior to todays visit as well as outside records available to me. Pt with son who supplements hx. patient has emailed me several times since last visit.  Once she restarted her medication, after the DBS was turned on, the patient had fairly significant dyskinesia.  We tried small dosages of levodopa, but ultimately ended up going off of that.  Current prescribed movement disorder medications: Pramipexole 0.5 mg daily   PREVIOUS MEDICATIONS: northera (worked well but pt d/c when they quit sending b/c she owed $); amantadine (discontinued because of hallucinations); pramipexole (decreased in past because of hallucinations/dyskinesia)  ALLERGIES:   Allergies  Allergen Reactions   Amitriptyline Other (See Comments)    Sedated next morning   Ciprofloxacin Nausea And  Vomiting   Cymbalta [Duloxetine Hcl] Other (See Comments)    Worsened depression   Iohexol Other (See Comments)     Code: HIVES, Desc: PT developed 2 hives, followed by SOB, severe headache post 87cc's Omnipaque 300., Onset Date: [id number]    Lyrica [Pregabalin] Other (See Comments)    Tried during hospitalization - unsure effects but unable to tolerate   Imipramine Hcl Rash   Iodine Rash   Lidocaine Hcl Rash   Morphine Sulfate Rash   Neosporin [Neomycin-Bacitracin Zn-Polymyx] Rash and Other (See Comments)    Worsened skin breaking out   Sulfamethoxazole Rash   Tetracyclines & Related Rash    CURRENT MEDICATIONS:  Outpatient Encounter Medications as of 08/22/2021  Medication Sig   acetaminophen (TYLENOL) 650 MG CR tablet Take 2 tablets (1,300 mg total) by mouth every 8 (eight) hours as needed for pain.   AMBULATORY NON FORMULARY MEDICATION Lift chair Dx:  G20   Calcium Carbonate-Vitamin D (CALCIUM 600/VITAMIN D) 600-400 MG-UNIT chew tablet Chew 1 tablet by mouth daily.   carbidopa-levodopa (SINEMET IR) 25-100 MG tablet TAKE 1 TABLETS BY MOUTH  AT 6 AM ,0.5 TABLET AT 9:30 AM, 0.5 PM AND 4:30 PM. MAY TAKE AN EXTRA ONE-HALF TABLET BY MOUTH AS NEEDED IN THE EVENING (Patient taking differently: Take 0.5-2 tablets by mouth See admin instructions. Take 2 tablets by mouth at 6am, 1 tablet at 9:30am and 4pm, may also take 1/2 tablet between 4pm and 8pm as needed for tremors)   Cholecalciferol (VITAMIN D3) 25 MCG (1000 UT) CAPS Take 1 capsule (1,000 Units total) by mouth daily.   clonazePAM (KLONOPIN) 0.5 MG tablet Take 1 tablet (0.5 mg total) by mouth in the morning and at bedtime.   clotrimazole-betamethasone (LOTRISONE) cream Apply 1 application topically daily. (Patient taking differently: Apply 1 application topically daily as needed (irritation).)   cyanocobalamin (,VITAMIN B-12,) 1000 MCG/ML injection INJECT 1 ML (1,000 MCG TOTAL) INTO THE MUSCLE EVERY 30 (THIRTY) DAYS.   denosumab (PROLIA)  60 MG/ML SOSY injection Inject 60 mg into the skin every 6 (six) months.   fludrocortisone (FLORINEF) 0.1 MG tablet Take 1 tablet (0.1 mg total) by mouth daily.   Glycerin-Hypromellose-PEG 400 (ARTIFICIAL TEARS) 0.2-0.2-1 % SOLN Place 1 drop into both eyes daily as needed (dry eyes).   HYDROcodone-acetaminophen (NORCO/VICODIN) 5-325 MG tablet Take 1 tablet by mouth every 6 (six) hours as needed for moderate pain.   latanoprost (XALATAN) 0.005 % ophthalmic solution Place 1 drop into both eyes at bedtime.   polyethylene glycol (MIRALAX / GLYCOLAX) packet Take 17 g by mouth as needed. (Patient taking differently: Take 17 g by mouth daily as needed (constipation).)   sertraline (ZOLOFT) 100 MG tablet TAKE 1 TABLET BY MOUTH  DAILY (Patient taking differently: Take by mouth every morning.)   traMADol (ULTRAM) 50 MG tablet Take 1 tablet (50 mg total) by mouth every 6 (six) hours as needed for moderate pain.   No facility-administered encounter medications on file as of 08/22/2021.    Objective:   PHYSICAL EXAMINATION:    VITALS:   Vitals:   08/22/21 1248  BP: 115/68  Pulse: 80  SpO2: 97%  Weight: 151 lb (68.5 kg)  Height: 5\' 6"  (1.676 m)     GEN:  The patient appears stated age and is in NAD. HEENT:  Normocephalic, atraumatic.  The mucous membranes are moist. The superficial temporal arteries are without ropiness or tenderness. CV:  RRR Lungs:  CTAB Neck/HEME:  There are no carotid bruits bilaterally.  Neurological examination:  Orientation: The patient is alert and oriented x3. Cranial nerves: There is good facial symmetry. The speech is fluent and hypophonic and occasionally dysarthric. Soft palate rises symmetrically and there is no tongue deviation. Hearing is intact to conversational tone. Sensation: Sensation is intact to light touch throughout Motor: Strength is 5/5 in the bilateral upper and lower extremities.   Shoulder shrug is equal and symmetric.  There is no pronator  drift.  Movement examination: Tone: There is normal tone in the upper and lower extremities before and after programming Abnormal movements: she had b/l UE rest tremor prior to programming.  During the programming, she developed fairly significant left arm dyskinesia.  That resolved post programming.  Post programming, she had rare tremor of the right hand Coordination:  There is normal rapid alternating movements on the left.  On the right, she only had trouble with the action of "turning in a light bulb." Gait and Station: When the patient walked in the office, she was actually able to set her walker to the side and walked quite well in the office hallway.  During programming, she had periods of dyskinesia and periods of ataxia, but those resolved post programming.    Chemistry      Component Value Date/Time   NA 142 07/22/2021 0431   K 3.4 (L) 07/22/2021 0431   CL 108 07/22/2021 0431   CO2 24 07/22/2021 0431   BUN 21 07/22/2021 0431   CREATININE 0.68 07/22/2021 0431   CREATININE 0.87 10/08/2019 0921      Component Value Date/Time   CALCIUM 9.4 07/22/2021 0431   ALKPHOS 70 07/22/2021 0431   AST 31 07/22/2021 0431   ALT 6 07/22/2021 0431   BILITOT 0.6 07/22/2021 0431       Lab Results  Component Value Date   WBC 12.3 (H) 07/22/2021   HGB 11.9 (L) 07/22/2021   HCT 38.9 07/22/2021   MCV 90.0 07/22/2021   PLT 399 07/22/2021    Lab Results  Component Value Date   TSH 0.49 10/04/2018    Total time spent on today's visit was 20 minutes, including both face-to-face time and nonface-to-face time.  Time included that spent on review of records (prior notes available to me/labs/imaging if pertinent), discussing treatment and goals, answering patient's questions and coordinating care.  This did not include the time spent activating the device, which is documented separately.   Cc:  [name], MD

## 2021-08-22 ENCOUNTER — Other Ambulatory Visit: Payer: Self-pay

## 2021-08-22 ENCOUNTER — Ambulatory Visit: Payer: Medicare Other | Admitting: Neurology

## 2021-08-22 DIAGNOSIS — G20 Parkinson's disease: Secondary | ICD-10-CM

## 2021-08-22 NOTE — Procedures (Signed)
DBS Programming was performed.    Manufacturer of DBS device: [employer], bluetooth  Total time spent programming was 60 minutes.  Device was confirmed to be on.  Soft start was confirmed to be on.  Impedences were checked and were within normal limits.  Battery was checked and was determined to be functioning normally and not near the end of life.  Final settings were as follows:    Active Contact Amplitude (mA) PW (ms) Frequency (hz) Side Effects  Left Brain       08/08/21 3-C+ 1.7 60 130   Other trials        1-C+ 2.7 60 130 tremor   (2/3/4)-C+ 3.0 60 130 Face pull above 2.1   (5/6/7)-C+ 1.8 60 130 ? Face pull (some baseline facial asymmetry)  08/22/21 5-(25%)7-(75%)C+ 2.5 60 159                                      Right Brain       08/08/21 (5/6/7)-C+ 1.6 60 130   Other trials 1-C+ 1.5 60 130 ? Speech change   (2/3/4)-C+ 1.5 60 130 Pretty good   8-C+ 1.6 60 130 tremor  08/22/21 8-C+ 2.7 60 136

## 2021-08-23 ENCOUNTER — Other Ambulatory Visit: Payer: Self-pay | Admitting: Neurology

## 2021-08-30 ENCOUNTER — Other Ambulatory Visit: Payer: Self-pay

## 2021-08-30 ENCOUNTER — Ambulatory Visit (INDEPENDENT_AMBULATORY_CARE_PROVIDER_SITE_OTHER): Payer: Medicare Other

## 2021-08-30 DIAGNOSIS — M810 Age-related osteoporosis without current pathological fracture: Secondary | ICD-10-CM | POA: Diagnosis not present

## 2021-08-30 DIAGNOSIS — E538 Deficiency of other specified B group vitamins: Secondary | ICD-10-CM

## 2021-08-30 MED ORDER — DENOSUMAB 60 MG/ML ~~LOC~~ SOSY
60.0000 mg | PREFILLED_SYRINGE | Freq: Once | SUBCUTANEOUS | Status: AC
  Administered 2021-08-30: 60 mg via SUBCUTANEOUS

## 2021-08-30 MED ORDER — CYANOCOBALAMIN 1000 MCG/ML IJ SOLN
1000.0000 ug | Freq: Once | INTRAMUSCULAR | Status: AC
Start: 2021-08-30 — End: 2021-08-30
  Administered 2021-08-30: 1000 ug via INTRAMUSCULAR

## 2021-08-30 NOTE — Progress Notes (Signed)
Per orders of Dr. Danise Mina, injection of B77-monthly and Prolia given by Kris Mouton. Patient tolerated injection well.

## 2021-09-02 NOTE — Progress Notes (Signed)
Assessment/Plan:    1.  Parkinsons Disease             -Patient completed neurocognitive testing on January 25, 2021 with Dr. Nicole Kindred.  Evidence of MCI only.  -Patient is status post bilateral STN DBS on July 07, 2021 with IPG placement on July 14, 2021.    -told she could take extra 1/2 levodopa prn but not on regular basis right now  -discussed balance between dyskinesia and tremor.  She expressed understanding  -Make a referral for physical therapy. 2.  Dyskinesia             -Very little post programming. 3.  Neurogenic Orthostatic Hypotension             -Did well on Northera.  Patient stopped that on her own.  Now on Florinef 0.1 mg daily 4.  Depression             -On sertraline             -Declines counseling 5.  b12 deficiency             -on injections. 6.  Urinary incontinence             -on myrbetriq.  She is doing well in that regard 7.  Lumbar radiculopathy             -She is status post lumbar foraminotomy with Dr. Vertell Limber on November 27, 2019 but pain has returned and may need f/u with Dr. Vertell Limber again 8.  Fatigue  -This is really fairly common post DBS surgery.  In addition, I wonder if she experienced this because we had to get her off of her medication so quickly because of significant dyskinesia following DBS (even before turning on the device).  We may need to adjust her sertraline, but in the meantime I would really like to see her backing down on the clonazepam to see if that helped with the daytime fatigue.  For now, I asked her to take just half a tablet of clonazepam in the morning and 1 tablet of the clonazepam at nighttime, and then I told her I would let her prescribing physician know (her primary care).   Subjective:   [name] was seen today in follow up for levodopa challenge.  My previous records were reviewed prior to todays visit as well as outside records available to me. Pt last seen September 26, at which time she was taken off of all  regular Parkinson's medications because of trouble some dyskinesia.  She was told she could take half levodopa as needed.  She really has not done that.  Biggest complaints really are fatigue and some headache.  She is also having some hip pain and some back pain.  The back pain is chronic.  Current prescribed movement disorder medications: Levodopa as needed, but not regular   PREVIOUS MEDICATIONS: northera (worked well but pt d/c when they quit sending b/c she owed $); amantadine (discontinued because of hallucinations); pramipexole (decreased in past because of hallucinations/dyskinesia)  ALLERGIES:   Allergies  Allergen Reactions   Amitriptyline Other (See Comments)    Sedated next morning   Ciprofloxacin Nausea And Vomiting   Cymbalta [Duloxetine Hcl] Other (See Comments)    Worsened depression   Iohexol Other (See Comments)     Code: HIVES, Desc: PT developed 2 hives, followed by SOB, severe headache post 87cc's Omnipaque 300., Onset Date: [id number]    Lyrica [Pregabalin] Other (  See Comments)    Tried during hospitalization - unsure effects but unable to tolerate   Imipramine Hcl Rash   Iodine Rash   Lidocaine Hcl Rash   Morphine Sulfate Rash   Neosporin [Neomycin-Bacitracin Zn-Polymyx] Rash and Other (See Comments)    Worsened skin breaking out   Sulfamethoxazole Rash   Tetracyclines & Related Rash    CURRENT MEDICATIONS:  Outpatient Encounter Medications as of 09/05/2021  Medication Sig   acetaminophen (TYLENOL) 650 MG CR tablet Take 2 tablets (1,300 mg total) by mouth every 8 (eight) hours as needed for pain.   AMBULATORY NON FORMULARY MEDICATION Lift chair Dx:  G20   Calcium Carbonate-Vitamin D (CALCIUM 600/VITAMIN D) 600-400 MG-UNIT chew tablet Chew 1 tablet by mouth daily.   carbidopa-levodopa (SINEMET IR) 25-100 MG tablet TAKE 1 TABLETS BY MOUTH AT 6 AM ,0.5 TABLET AT 9:30 AM, 0.5 PM AND 4:30 PM. MAY TAKE AN EXTRA ONE-HALF TABLET BY MOUTH AS NEEDED IN THE EVENING  (Patient taking differently: Take 0.5-2 tablets by mouth See admin instructions. Take 2 tablets by mouth at 6am, 1 tablet at 9:30am and 4pm, may also take 1/2 tablet between 4pm and 8pm as needed for tremors)   Cholecalciferol (VITAMIN D3) 25 MCG (1000 UT) CAPS Take 1 capsule (1,000 Units total) by mouth daily.   clonazePAM (KLONOPIN) 0.5 MG tablet Take 1 tablet (0.5 mg total) by mouth in the morning and at bedtime.   clotrimazole-betamethasone (LOTRISONE) cream Apply 1 application topically daily. (Patient taking differently: Apply 1 application topically daily as needed (irritation).)   cyanocobalamin (,VITAMIN B-12,) 1000 MCG/ML injection INJECT 1 ML (1,000 MCG TOTAL) INTO THE MUSCLE EVERY 30 (THIRTY) DAYS.   denosumab (PROLIA) 60 MG/ML SOSY injection Inject 60 mg into the skin every 6 (six) months.   fludrocortisone (FLORINEF) 0.1 MG tablet Take 1 tablet (0.1 mg total) by mouth daily.   Glycerin-Hypromellose-PEG 400 (ARTIFICIAL TEARS) 0.2-0.2-1 % SOLN Place 1 drop into both eyes daily as needed (dry eyes).   HYDROcodone-acetaminophen (NORCO/VICODIN) 5-325 MG tablet Take 1 tablet by mouth every 6 (six) hours as needed for moderate pain.   latanoprost (XALATAN) 0.005 % ophthalmic solution Place 1 drop into both eyes at bedtime.   polyethylene glycol (MIRALAX / GLYCOLAX) packet Take 17 g by mouth as needed. (Patient taking differently: Take 17 g by mouth daily as needed (constipation).)   sertraline (ZOLOFT) 100 MG tablet TAKE 1 TABLET BY MOUTH  DAILY (Patient taking differently: Take by mouth every morning.)   traMADol (ULTRAM) 50 MG tablet Take 1 tablet (50 mg total) by mouth every 6 (six) hours as needed for moderate pain.   No facility-administered encounter medications on file as of 09/05/2021.    Objective:   PHYSICAL EXAMINATION:    VITALS:   Vitals:   09/05/21 1426  BP: 124/72  Pulse: 74  SpO2: 98%  Weight: 151 lb (68.5 kg)  Height: 5\' 6"  (1.676 m)      GEN:  The patient  appears stated age and is in NAD. HEENT:  Normocephalic, atraumatic.  The mucous membranes are moist. The superficial temporal arteries are without ropiness or tenderness. CV:  RRR Lungs:  CTAB Neck/HEME:  There are no carotid bruits bilaterally.  Neurological examination:  Orientation: The patient is alert and oriented x3. Cranial nerves: There is good facial symmetry. The speech is fluent and hypophonic and occasionally dysarthric. Soft palate rises symmetrically and there is no tongue deviation. Hearing is intact to conversational tone. Sensation: Sensation is  intact to light touch throughout Motor: Strength is 5/5 in the bilateral upper and lower extremities.   Shoulder shrug is equal and symmetric.  There is no pronator drift.  Movement examination: Tone: There is normal tone in the upper and lower extremities before and after programming Abnormal movements: she had b/l UE rest tremor prior to programming.  During various times in the programming she developed either facial dyskinesia or dystonia of the right arm.  Those were much better following programming and she had very little tremor following programming as well. Coordination:  There is normal rapid alternating movements when she left today. Gait and Station: When the patient walked in the office, she was initially slow and shuffling.  Following the programming, she was actually able to walk much better and actually started to dance in the hall.    Chemistry      Component Value Date/Time   NA 142 07/22/2021 0431   K 3.4 (L) 07/22/2021 0431   CL 108 07/22/2021 0431   CO2 24 07/22/2021 0431   BUN 21 07/22/2021 0431   CREATININE 0.68 07/22/2021 0431   CREATININE 0.87 10/08/2019 0921      Component Value Date/Time   CALCIUM 9.4 07/22/2021 0431   ALKPHOS 70 07/22/2021 0431   AST 31 07/22/2021 0431   ALT 6 07/22/2021 0431   BILITOT 0.6 07/22/2021 0431       Lab Results  Component Value Date   WBC 12.3 (H) 07/22/2021    HGB 11.9 (L) 07/22/2021   HCT 38.9 07/22/2021   MCV 90.0 07/22/2021   PLT 399 07/22/2021    Lab Results  Component Value Date   TSH 0.49 10/04/2018    Total time spent on today's visit was 20 minutes, including both face-to-face time and nonface-to-face time.  Time included that spent on review of records (prior notes available to me/labs/imaging if pertinent), discussing treatment and goals, answering patient's questions and coordinating care.  Most of the visit was spent in DBS care, which is documented separate.   Cc:  [name], MD

## 2021-09-05 ENCOUNTER — Encounter: Payer: Self-pay | Admitting: Neurology

## 2021-09-05 ENCOUNTER — Ambulatory Visit: Payer: Medicare Other | Admitting: Neurology

## 2021-09-05 ENCOUNTER — Other Ambulatory Visit: Payer: Self-pay

## 2021-09-05 VITALS — BP 124/72 | HR 74 | Ht 66.0 in | Wt 151.0 lb

## 2021-09-05 DIAGNOSIS — G20 Parkinson's disease: Secondary | ICD-10-CM

## 2021-09-05 NOTE — Procedures (Signed)
DBS Programming was performed.    Manufacturer of DBS device: [employer], bluetooth  Total time spent programming was 65 minutes.  Device was confirmed to be on.  Soft start was confirmed to be on.  Impedences were checked and were within normal limits.  Battery was checked and was determined to be functioning normally and not near the end of life.  Final settings were as follows:    Active Contact Amplitude (mA) PW (ms) Frequency (hz) Side Effects  Left Brain       08/08/21 3-C+ 1.7 60 130   Other trials        1-C+ 2.7 60 130 tremor   (2/3/4)-C+ 3.0 60 130 Face pull above 2.1   (5/6/7)-C+ 1.8 60 130 ? Face pull (some baseline facial asymmetry)  08/22/21 5-(25%)7-(75%)C+ 2.5 60 159   09/05/21 final 5-(25%)6-(75%)C+ 2.9 60 159   Other trials 5-(50%)6-(50%)C+ 2.9 60 159    5-(25%)7-(75%)C+ 3.5 60 159 Tremor not controlled                              Right Brain       08/08/21 (5/6/7)-C+ 1.6 60 130   Other trials 1-C+ 1.5 60 130 ? Speech change   (2/3/4)-C+ 1.5 60 130 Pretty good   8-C+ 1.6 60 130 tremor  08/22/21 8-C+ 2.7 60 136   09/05/21 final 6-(50%)8-(50%)C+ 2.9 60 159   others 6-(60%)8-(40%)C+ 2.8  60 136 Mouth dyskinesia

## 2021-09-10 ENCOUNTER — Other Ambulatory Visit: Payer: Self-pay | Admitting: Family Medicine

## 2021-09-10 MED ORDER — CLONAZEPAM 0.5 MG PO TABS: ORAL_TABLET | ORAL | 0 refills | Status: DC

## 2021-09-10 NOTE — Progress Notes (Signed)
Noted change recommended by neurology - new dose sent to pharmacy as she's due for refill.

## 2021-09-13 ENCOUNTER — Ambulatory Visit: Payer: Medicare Other

## 2021-09-13 ENCOUNTER — Ambulatory Visit: Payer: Medicare Other | Admitting: Physical Therapy

## 2021-09-14 ENCOUNTER — Telehealth: Payer: Self-pay

## 2021-09-14 DIAGNOSIS — M5416 Radiculopathy, lumbar region: Secondary | ICD-10-CM | POA: Diagnosis not present

## 2021-09-14 DIAGNOSIS — R030 Elevated blood-pressure reading, without diagnosis of hypertension: Secondary | ICD-10-CM | POA: Diagnosis not present

## 2021-09-14 DIAGNOSIS — M48061 Spinal stenosis, lumbar region without neurogenic claudication: Secondary | ICD-10-CM | POA: Diagnosis not present

## 2021-09-14 DIAGNOSIS — M4126 Other idiopathic scoliosis, lumbar region: Secondary | ICD-10-CM | POA: Diagnosis not present

## 2021-09-14 DIAGNOSIS — M545 Low back pain, unspecified: Secondary | ICD-10-CM | POA: Diagnosis not present

## 2021-09-14 DIAGNOSIS — G20 Parkinson's disease: Secondary | ICD-10-CM | POA: Diagnosis not present

## 2021-09-14 NOTE — Chronic Care Management (AMB) (Addendum)
Chronic Care Management Pharmacy Assistant   Name: [name]  MRN: [id number] DOB: 1946/02/19  [name] is an 75 y.o. year old female who presents for her initial CCM visit with the clinical pharmacist.  Reason for Encounter: Initial Questions   Conditions to be addressed/monitored: CAD and HLD   Recent office visits:  07/29/21-PCP-Patient presented for 2 month follow up Parkinson's Disease.B-12 shot today,no medication changes follow up 3 months  05/27/21-PCP-Patient presented for follow up fall and rash.Restart Northera 100mg  TID,titrate to effect.Start Nystatin -triamcinolone ointment.Follow up 2-3 months 04/27/21-PCP-Patient presented for follow up Parkinson's Disease. B-12 shot today,Northera became unaffordable. using walker regular now.follow up 6 months  Recent consult visits:  09/05/21-Neurology-Patient presented for follow up Parkinson's Disease. I would really like to see her backing down on the clonazepam to see if that helped with the daytime fatigue.  For now, I asked her to take just half a tablet of clonazepam in the morning and 1 tablet of the clonazepam at nighttime,Referral for physical therapy and speech therapy  08/22/21-Neurology-Patient presented for follow up Parkinson's Disease.Discontinue pramipexole, take extra 1/2 levodopa as needed,currently on Florinef 0.1mg  take 1 tablet daily  08/08/21-Neurology-Patient presented for follow up Parkinson's Disease. Continue carbidopa/levodopa 50/200 1 at bedtime,decrease pramipexole 0.5 mg, half tablet 3 times per day for a week and then half tablet twice a day for a week and then half tablet once a day for a week and then discontinue pramipexole 05/23/21-Plastic Surgery-Patient presented for foreign body eyelid, no data found. 04/20/21-Plastic Surgery- no data found 04/18/21-Neurology-Patient presented for follow up Parkinson's Disease. 03/30/21-Neurosurgery-no data found     Hospital visits:  07/21/21 thru 07/23/21-[facility] presented for abnormal movements.Labs,CT,ordered,admission due to Parkinson's and s/p deep brain stimulator placement,neurologist consult,Mild rhabdomyolysis likely from worsening abdominal movements.  Gently hydrate follow CK levels. Normocytic normochromic anemia check anemia panel follow CBC. 07/14/21 thru 07/15/21-[facility]-Patient presented for deep brain stimulator placement-30 hour observation. IV- Ondansetron, Aprepitant and Dexamethasone 07/07/21-07/08/21-[facility]-Patient presented for surgery for bilateral deep brain stimulator placement.No complications,post op CT ordered,observation and discharge to home with Tramadol 50mg  take 1 tablet every 6 hours as needed for pain.Stop taking cephalexin. 06/30/21-[facility]-Patient presented for H&P.No medication changes. 05/16/21 thru 05/17/21-[facility]-Patient presented for fall after dizziness, lightheadedness. CBC,UA,EKG, Started on Rocephin in the ED, continue.She is on home Klonopin 0.5 mg twice daily which can increase fall risk, defer to neurology to taper off if indicated. Continue home Klonopin and Zoloft 05/15/21-[facility] presented for fall episode. Previous labs reviewed-discussed using ice, take pain medication available at home,monitor BP at home, discharged to home.  Medications: Outpatient Encounter Medications as of 09/14/2021  Medication Sig Note   acetaminophen (TYLENOL) 650 MG CR tablet Take 2 tablets (1,300 mg total) by mouth every 8 (eight) hours as needed for pain.    AMBULATORY NON FORMULARY MEDICATION Lift chair Dx:  G20    Calcium Carbonate-Vitamin D (CALCIUM 600/VITAMIN D) 600-400 MG-UNIT chew tablet Chew 1 tablet by mouth daily.    carbidopa-levodopa (SINEMET IR) 25-100 MG tablet TAKE 1 TABLETS BY MOUTH AT 6 AM ,0.5 TABLET AT 9:30 AM, 0.5 PM AND 4:30 PM. MAY TAKE AN EXTRA ONE-HALF TABLET BY MOUTH AS NEEDED IN THE EVENING (Patient  taking differently: Take 0.5-2 tablets by mouth See admin instructions. Take 2 tablets by mouth at 6am, 1 tablet at 9:30am and 4pm, may also take 1/2 tablet between 4pm and 8pm as  needed for tremors)    Cholecalciferol (VITAMIN D3) 25 MCG (1000 UT) CAPS Take 1 capsule (1,000 Units total) by mouth daily.    clonazePAM (KLONOPIN) 0.5 MG tablet Take 0.5 tablets (0.25 mg total) by mouth every morning AND 1 tablet (0.5 mg total) at bedtime.    clotrimazole-betamethasone (LOTRISONE) cream Apply 1 application topically daily. (Patient taking differently: Apply 1 application topically daily as needed (irritation).)    cyanocobalamin (,VITAMIN B-12,) 1000 MCG/ML injection INJECT 1 ML (1,000 MCG TOTAL) INTO THE MUSCLE EVERY 30 (THIRTY) DAYS. 07/22/2021: Next injection due 07/29/2021   denosumab (PROLIA) 60 MG/ML SOSY injection Inject 60 mg into the skin every 6 (six) months. 05/16/2021: Last injection 02/22/2021   fludrocortisone (FLORINEF) 0.1 MG tablet Take 1 tablet (0.1 mg total) by mouth daily.    Glycerin-Hypromellose-PEG 400 (ARTIFICIAL TEARS) 0.2-0.2-1 % SOLN Place 1 drop into both eyes daily as needed (dry eyes).    HYDROcodone-acetaminophen (NORCO/VICODIN) 5-325 MG tablet Take 1 tablet by mouth every 6 (six) hours as needed for moderate pain. 07/22/2021: #30 filled 06/30/2021 [facility]   latanoprost (XALATAN) 0.005 % ophthalmic solution Place 1 drop into both eyes at bedtime.    polyethylene glycol (MIRALAX / GLYCOLAX) packet Take 17 g by mouth as needed. (Patient taking differently: Take 17 g by mouth daily as needed (constipation).)    sertraline (ZOLOFT) 100 MG tablet TAKE 1 TABLET BY MOUTH  DAILY (Patient taking differently: Take by mouth every morning.)    traMADol (ULTRAM) 50 MG tablet Take 1 tablet (50 mg total) by mouth every 6 (six) hours as needed for moderate pain. 07/22/2021: #30 filled 07/08/2021 [facility]   No facility-administered encounter medications on file as of 09/14/2021.    Lab Results   Component Value Date/Time   HGBA1C 5.6 05/13/2013 08:57 AM   HGBA1C 5.8 02/11/2007 04:25 PM     BP Readings from Last 3 Encounters:  09/05/21 124/72  08/22/21 115/68  08/08/21 139/70    Patient contacted to review initial questions prior to visit with [name] for 09/21/21 at 9:00am   Have you seen any other providers since your last visit with PCP? Yes  Neurology-several visits s/p Brain stimulator implant  Any changes in your medications or health? Yes She has brain stimulator implant now  Any side effects from any medications? No  Do you have an symptoms or problems not managed by your medications? No  Any concerns about your health right now?  The patient wants to discuss medications due to coming off several while going through the process of having the surgery  Has your provider asked that you check blood pressure, blood sugar, or follow special diet at home? No  Do you get any type of exercise on a regular basis? No The patient is post op  Can you think of a goal you would like to reach for your health? No  Do you have any problems getting your medications? Yes The patient has used [facility] order and [facility] in [place], and she is thinking about [place] site.   Is there anything that you would like to discuss during the appointment? Yes the patient is anxious to get her medications straight and what she needs to be taking.   Spoke with patient and reminded them to have all medications, supplements and any blood glucose and blood pressure readings available for review with pharmacist, at their telephone visit on 09/21/21 at 9:00am.   Star Rating Drugs:  Medication:  Last Fill: Day  Supply None identified  Care Gaps: Annual wellness visit in last year? No Most Recent BP reading: 124/72  74-P 09/05/21  [name], [place] Assistant [phone]  Total time spent for month CPA: 40 min

## 2021-09-20 ENCOUNTER — Encounter: Payer: Self-pay | Admitting: Physical Therapy

## 2021-09-20 ENCOUNTER — Other Ambulatory Visit: Payer: Self-pay

## 2021-09-20 ENCOUNTER — Ambulatory Visit: Payer: Medicare Other | Attending: Neurology | Admitting: Physical Therapy

## 2021-09-20 ENCOUNTER — Ambulatory Visit: Payer: Medicare Other

## 2021-09-20 ENCOUNTER — Other Ambulatory Visit (HOSPITAL_COMMUNITY): Payer: Self-pay | Admitting: Neurosurgery

## 2021-09-20 DIAGNOSIS — G20 Parkinson's disease: Secondary | ICD-10-CM | POA: Insufficient documentation

## 2021-09-20 DIAGNOSIS — R293 Abnormal posture: Secondary | ICD-10-CM

## 2021-09-20 DIAGNOSIS — R2681 Unsteadiness on feet: Secondary | ICD-10-CM | POA: Diagnosis not present

## 2021-09-20 DIAGNOSIS — I69991 Dysphagia following unspecified cerebrovascular disease: Secondary | ICD-10-CM | POA: Diagnosis not present

## 2021-09-20 DIAGNOSIS — M6281 Muscle weakness (generalized): Secondary | ICD-10-CM

## 2021-09-20 DIAGNOSIS — R471 Dysarthria and anarthria: Secondary | ICD-10-CM

## 2021-09-20 DIAGNOSIS — R2689 Other abnormalities of gait and mobility: Secondary | ICD-10-CM | POA: Diagnosis not present

## 2021-09-20 DIAGNOSIS — M5416 Radiculopathy, lumbar region: Secondary | ICD-10-CM

## 2021-09-20 NOTE — Therapy (Signed)
[facility] [address], [place], [place], [address] Phone: [phone]   Fax:  [phone]  Speech Language Pathology Evaluation  Patient Details  Name: [name] MRN: [id number] Date of Birth: 1946-07-03 Referring Provider (SLP): Alonza Bogus, [place]   Encounter Date: 09/20/2021   End of Session - 09/20/21 1151     Visit Number 1    Number of Visits 25    Date for SLP Re-Evaluation 12/19/21    SLP Start Time 64    SLP Stop Time  1100    SLP Time Calculation (min) 40 min    Activity Tolerance Patient tolerated treatment well             Past Medical History:  Diagnosis Date   Allergy    ANEMIA-NOS 09/25/2007   Anxiety    Arthritis    B12 DEFICIENCY 05/03/2007   Blood transfusion without reported diagnosis    CAD (coronary artery disease) 01/2010   MI, Nishan   Cardiomyopathy ([place]) 02/08/2010   H/o this 2012 after urosepsis, no recurrence.    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   OSTEOPOROSIS 08/2009   bisphosphonate on hold 2/2 dysphagia, on reclast done in August each year   Parkinson's disease ([place]) 08/25/2015   Dx Dr [name] 07/2015    PONV (postoperative nausea and vomiting)    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   Takotsubo cardiomyopathy 2008   due to E coli urosepsis    Past Surgical History:  Procedure Laterality Date   ABDOMINAL HYSTERECTOMY  1970s   IUD infection  - first partial then with oophorectomy (cysts), complication - low blood pressure   CATARACT EXTRACTION Bilateral    CHOLECYSTECTOMY     complication - low blood pressure   COLONOSCOPY  06/2008   h/o polyps but latest WNL, rec rpt 10 yrs Olevia Perches)   COLONOSCOPY  11/2018   multiple TAs (10 polyps total), rpt 2 yrs Fuller Plan)   CYSTOSCOPY  12/2013   abx treatment for recurrent cystitis   DEXA  04/2013   T -2.9 @ femur, -1.6 @ spine   DEXA  04/2017   T -2.9 hip, -0.7 spine   ESOPHAGOGASTRODUODENOSCOPY  12/2017   WNL, regardless esophagus dilated, small HH [facility] Plan)   EYE SURGERY     LUMBAR LAMINECTOMY/DECOMPRESSION MICRODISCECTOMY Right 11/27/2019   Right Lumbar Four-Five foraminotomy;  Erline Levine, MD)   MINOR PLACEMENT OF FIDUCIAL N/A 06/30/2021   Procedure: MINOR PLACEMENT OF FIDUCIAL;  Surgeon: Erline Levine, MD;  Location: [place];  Service: Neurosurgery;  Laterality: N/A;  Minor room   PTOSIS REPAIR Bilateral 10/2020   Plastic Surgery   PULSE GENERATOR IMPLANT Right 07/14/2021   Procedure: UNILATERAL PULSE GENERATOR IMPLANT;  Surgeon: Erline Levine, MD;  Location: [place];  Service: Neurosurgery;  Laterality: Right;   SUBTHALAMIC STIMULATOR INSERTION Bilateral 07/07/2021   Procedure:  Deep brain stimulator placement;  Surgeon: Erline Levine, MD;  Location: [place];  Service: Neurosurgery;  Laterality: Bilateral;   TUBAL LIGATION     UPPER GASTROINTESTINAL ENDOSCOPY      There were no vitals filed for this visit.   Subjective Assessment - 09/20/21 1028     Subjective "I get hoarse, and then sometimes it feels like I'm losing my voice."    Currently in Pain? Yes    Pain Score 5     Pain Location Leg    Pain Orientation Right    Pain Descriptors / Indicators Aching                SLP Evaluation [facility] - 09/20/21 1030       SLP Visit Information   SLP Received On 09/20/21    Referring Provider (SLP) Alonza Bogus, DP    Onset Date sx at least since 2013; dx 2016    Medical  Diagnosis Parkinson's Disease      Subjective   Subjective "My kids ask me what's wrong with my voice?"    Patient/Family Stated Goal Improve communication effectiveness      General Information   Mobility Status entered with rolling/seated walker - PT eval following ST eval today      Prior Functional Status   Cognitive/Linguistic Baseline Baseline deficits   Pt with MCI noted in chart - nothing today indicating MCI but cognition not formally assessed today. Pt may require formal cognitive testing during her plan of care   Type of Home House    Available Support Family    Vocation Retired      Associate Professor   Overall Cognitive Status History of cognitive impairments - at baseline   see above   Behaviors Other (comment)   lethargic; reduced body awareness     Auditory Comprehension   Overall Auditory Comprehension Appears within functional limits for tasks assessed      Verbal Expression   Overall Verbal Expression Appears within functional limits for tasks assessed      Oral Motor/Sensory Function   Overall Oral Motor/Sensory Function Impaired    Labial ROM Within Functional Limits   initially reduced, but WFL with visual cues   Labial Symmetry Within Functional Limits    Labial Strength Reduced    Labial Coordination Reduced   with alternate motion (pucker/smile) and in conversation   Lingual ROM Within Functional Limits   intially reduced, better with visual cue   Lingual Symmetry Within Functional Limits    Lingual Strength Reduced Right    Lingual Sensation Reduced   rt>lt   Lingual Coordination Reduced   with alternate motion tasks (lingual tip to labial margins), adn also in conversation   Facial ROM Within Functional Limits   initially reduced, WFL with visual cues   Velum Within Functional Limits      Motor Speech   Overall Motor Speech Impaired    Respiration Impaired   clavicular and chest breathing patten noted   Level of Impairment Word    Phonation Low vocal  intensity;Hoarse;Breathy   low-mid 60s dB in conversation, low 70s dB "HEY!" with model; mid 70s dB "Ahhhhhh" with model; pt production of loud "hey" and loud /a/ req'd x4 and max cues to incr loudness - pt with reduced body awareness   Articulation Impaired    Level of Impairment Phrase    Intelligibility Intelligibility reduced    Word --   ~98% success - 6 dysarthric utterances requiring SLP to  ask pt to repeat; when pt did this she overarticulated slightly and incr'd volume which improved intelligibility for each utterance   Interfering Components --   severity of deficit, (possible) DBS placement   Effective Techniques Increased vocal intensity;Over-articulate   pt incr'd loudness, with initial mod cues, to mid-upper 60s dB, which improved speech clarity in "yes" and "no" responses. Minimal carryover was seen in conversation as loudness incr'd intermittently to mid 60s dB in simple conversation                            SLP Education - 09/20/21 1149     Education Details loud /a/, loud "hey", HEP for 5 loud /a/ BID, possible therapy goals, pt may need objective swallow eval during therapy course    Person(s) Educated Patient    Methods Explanation;Handout;Demonstration;Verbal cues    Comprehension Verbalized understanding;Returned demonstration;Verbal cues required;Need further instruction              SLP Short Term Goals - 09/21/21 1611       SLP SHORT TERM GOAL #1   Title pt will produce loud /a/ or "hey" with low 80s dB average in 3 sessions    Time 4    Period Weeks    Status New    Target Date 10/21/21      SLP SHORT TERM GOAL #2   Title pt will demo abdominal breathing at rest 75% of the time in 3 sessions    Time 4    Period Weeks    Status New    Target Date 10/21/21      SLP SHORT TERM GOAL #3   Title pt will have swallowing assessed, clinically    Time 2    Period Weeks    Target Date 10/07/21      SLP SHORT TERM GOAL #4   Title  [name] will answer simple "wh" questions with average volume in upper 60s dB in 3 sessions    Time 4    Period Weeks    Status New    Target Date 10/21/21              SLP Long Term Goals - 09/21/21 1614       SLP LONG TERM GOAL #1   Title pt will demonstrate abdominal breathing in simple Q and A responses 75% of the time in 3 sessions    Time 8    Period Weeks    Status New    Target Date 11/18/21      SLP LONG TERM GOAL #2   Title [name] will produce speech volume in 3 minutes simple conversation with average mid-upper 60s dB in 3 sessions    Time 8    Period Weeks    Status New    Target Date 11/18/21      SLP LONG TERM GOAL #3   Title Pt will produce speech volume of upper 60s dB in 5 minutes simple conversation with occasional nonverbal cues in 3 sessions    Time 12    Period Weeks    Status New    Target Date 12/16/21      SLP LONG TERM GOAL #4   Title pt will score at least 3 points higher on the Communication Effectiveness Survey    Baseline 25/32    Time 12    Period Weeks    Status New    Target Date 12/16/21  Plan - 09/20/21 1030     Clinical Impression Statement [name] presents today with moderate dysarthria c/b reduced articulatory precision, and reduced breath support. Conversational speech was below WNL levels, and pt demonstrated reduced body awareness in that she had difficulty improving loudness with "hey" and /a/ even with SLP model - it took x4 reps before pt productions incr'd in volume. Recent DBS placement in August 2022. She also reported intermittent pharyngeal dysphagia with liquids. Her score on the Communication Effectiveness Survey was 25/32; three questions (1,3, and &)were answered "2"  ("Sometimes I cough when I drink")."I can feel my voice go away - it gets quivery sometimes and then I lose it," pt stated. "I'm afraid I'm going to lose my voice." Pt wholeheartedly agreed to work with SLP to incr her conversational  loudness. During this therapy course pt may require an objective swallow evaluation (modifed barium swallow -MBSS, or fiberendoscoptic eval of swallowing -FEES).    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   Cough 12/20/2020   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   Left shoulder pain 06/19/2020   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   Degenerative lumbar spinal stenosis 11/27/2019   Knee pain, bilateral 08/06/2018   Constipation 08/06/2018   Right lumbar radiculopathy 04/10/2018   Dysphagia 10/09/2017   Chronic cough 07/16/2017   GAD (generalized anxiety disorder) 04/28/2016   Chronic insomnia 03/07/2016   Left Achilles tendinitis 12/08/2015   Parkinson's disease ([place]) 08/25/2015   Advanced care planning/counseling discussion 05/26/2015   Family circumstance 02/23/2015   Health maintenance examination 05/21/2014   MDD (major depressive disorder), single episode, moderate ([place]) 11/12/2013   DDD (degenerative disc disease), lumbar 05/31/2013   Medicare annual wellness visit, subsequent 05/20/2013   HLD (hyperlipidemia) 05/20/2013   Vitamin D deficiency 05/09/2013   Recurrent UTI 01/03/2011   Rash and other nonspecific skin eruption 05/11/2010   GERD 02/22/2010   CAD (coronary artery disease) 01/25/2010   Osteoporosis 11/10/2009   Orthostatic hypotension 12/06/2008   Anemia 09/25/2007   Vitamin B12 deficiency  05/03/2007   Fibromyalgia 05/03/2007    [name] ,MS, CCC-SLP  09/21/2021, 4:21 PM  [facility] [address] [place], [place], [address] Phone: [phone]   Fax:  [phone]  Name: [name] MRN: [id number] Date of Birth: [birth year]

## 2021-09-20 NOTE — Patient Instructions (Signed)
   DO FIVE LOUD "Olmito and Ol[place]o" twice a day~!  Reading out loud is also a great idea. You could read out loud for 2-3 minutes too.   I am looking forward to working with you on your speech!

## 2021-09-20 NOTE — Therapy (Signed)
[facility] [address] [place], [place], [address] Phone: [phone]   Fax:  [phone]  Physical Therapy Evaluation  Patient Details  Name: [name] MRN: [id number] Date of Birth: 02-24-1946 Referring Provider (PT): [name], Wells Guiles   Encounter Date: 09/20/2021   PT End of Session - 09/20/21 1623     Visit Number 1    Number of Visits 17    Date for PT Re-Evaluation 11/18/21    Authorization Type [employer] Medicare    Progress Note Due on Visit 10    PT Start Time 1106    PT Stop Time 1150    PT Time Calculation (min) 44 min    Activity Tolerance Patient tolerated treatment well    Behavior During Therapy [facility] for tasks assessed/performed             Past Medical History:  Diagnosis Date   Allergy    ANEMIA-NOS 09/25/2007   Anxiety    Arthritis    B12 DEFICIENCY 05/03/2007   Blood transfusion without reported diagnosis    CAD (coronary artery disease) 01/2010   MI, [place]   Cardiomyopathy ([place]) 02/08/2010   H/o this 2012 after urosepsis, no recurrence.    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   OSTEOPOROSIS 08/2009   bisphosphonate on hold 2/2 dysphagia, on reclast done in August each year   Parkinson's disease ([place]) 08/25/2015   Dx Dr [name] 07/2015    PONV (postoperative nausea and vomiting)    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   Takotsubo cardiomyopathy 2008   due to E coli  urosepsis    Past Surgical History:  Procedure Laterality Date   ABDOMINAL HYSTERECTOMY  1970s   IUD infection - first partial then with oophorectomy (cysts), complication - low blood pressure   CATARACT EXTRACTION Bilateral    CHOLECYSTECTOMY     complication - low blood pressure   COLONOSCOPY  06/2008   h/o polyps but latest WNL, rec rpt 10 yrs Olevia Perches)   COLONOSCOPY  11/2018   multiple TAs (10 polyps total), rpt 2 yrs Fuller Plan)   CYSTOSCOPY  12/2013   abx treatment for recurrent cystitis   DEXA  04/2013   T -2.9 @ femur, -1.6 @ spine   DEXA  04/2017   T -2.9 hip, -0.7 spine   ESOPHAGOGASTRODUODENOSCOPY  12/2017   WNL, regardless esophagus dilated, small HH Fuller Plan)   EYE SURGERY     LUMBAR LAMINECTOMY/DECOMPRESSION MICRODISCECTOMY Right 11/27/2019   Right Lumbar Four-Five foraminotomy;  Erline Levine, MD)   MINOR PLACEMENT OF FIDUCIAL N/A 06/30/2021   Procedure: MINOR PLACEMENT OF FIDUCIAL;  Surgeon: Erline Levine, MD;  Location: [facility];  Service: Neurosurgery;  Laterality: N/A;  Minor room   PTOSIS REPAIR Bilateral 10/2020   Plastic Surgery   PULSE GENERATOR IMPLANT Right 07/14/2021   Procedure: UNILATERAL PULSE GENERATOR IMPLANT;  Surgeon: Erline Levine, MD;  Location: [place];  Service: Neurosurgery;  Laterality: Right;   SUBTHALAMIC STIMULATOR INSERTION Bilateral 07/07/2021   Procedure: Deep brain stimulator placement;  Surgeon: Erline Levine, MD;  Location: [facility];  Service: Neurosurgery;  Laterality: Bilateral;   TUBAL LIGATION     UPPER GASTROINTESTINAL ENDOSCOPY      There were no vitals filed for this visit.    Subjective Assessment - 09/20/21 1114     Subjective Dr. Carles Collet referred me here for therapy after the deep brain stimulator surgery.  I also see Dr. Vertell Limber for my back/leg.  I am having nerve pain in my R leg and he is going to do an MRI.  Had the DBS surgery in August, because I had a lot of tremors and jerking of my head and neck.  Still have a little tremor in R  hand, but it is so much better.  Use a rollator (since just before the surgery) and I use a cane.  Had a fall in June and had rib fractures.    Patient Stated Goals To get better balance.    Currently in Pain? Yes    Pain Score 5     Pain Location Leg    Pain Orientation Right    Pain Descriptors / Indicators Aching;Tingling    Pain Type Acute pain    Pain Radiating Towards R lateral leg and into foot    Pain Onset More than a month ago    Pain Frequency Constant    Aggravating Factors  worst in the morning    Pain Relieving Factors heat    Effect of Pain on Daily Activities PT will monitor; pt is working to schedule MRI for follow-up for back/leg pain, so PT may not specifically address until further work-up is completed                [facility] PT Assessment - 09/20/21 1119       Assessment   Medical Diagnosis Parkinson's disease    Referring Provider (PT) [name], Wells Guiles    Onset Date/Surgical Date 09/05/21      Precautions   Precautions Fall;Other (comment)    Precaution Comments Bilateral deep brain stimulator placement 06/2021      Balance Screen   Has the patient fallen in the past 6 months Yes    How many times? 1    Has the patient had a decrease in activity level because of a fear of falling?  No    Is the patient reluctant to leave their home because of a fear of falling?  No      Home Ecologist residence    Living Arrangements Other (Comment);Children   grandchildren   Available Help at Discharge Family    Type of [place] to enter    Entrance Stairs-Number of Steps 1   front; 2 steps in the garage   Entrance Stairs-Rails Right    Home Layout Two level;Able to live on main level with bedroom/bathroom   sleeps on main level, but goes upstairs for showering   [place] - 4 wheels;Cane - single point;Other (comment)   Walking poles   Additional Comments lives with daughter and two grandchildren       Prior Function   Level of [place] Retired    Leisure Enjoys shopping, travelling with church group      Observation/Other Assessments  Focus on Therapeutic Outcomes (FOTO)  NA      Posture/Postural Control   Posture Comments decreased lordosis, fwd head, rounded shoulders      ROM / Strength   AROM / PROM / Strength Strength      Strength   Overall Strength Deficits    Overall Strength Comments grossly testing in sittingstrength of the LLE 4/5, RLE 3/5 with pain in the RLE and shotting into back      Transfers   Transfers Sit to Stand;Stand to Sit    Sit to Stand 5: Supervision;With upper extremity assist;From chair/3-in-1    Five time sit to stand comments  25.53    Stand to Sit 5: Supervision;With upper extremity assist;To chair/3-in-1;Uncontrolled descent      Ambulation/Gait   Ambulation/Gait Yes    Ambulation/Gait Assistance 5: Supervision    Ambulation Distance (Feet) 50 Feet   x 2   Assistive device Rollator    Gait Pattern Step-through pattern;Step-to pattern;Decreased arm swing - right;Decreased arm swing - left;Decreased step length - right;Decreased step length - left;Decreased stride length;Decreased dorsiflexion - right;Decreased dorsiflexion - left;Narrow base of support;Poor foot clearance - left;Poor foot clearance - right    Ambulation Surface Level;Indoor    Gait velocity 41.18   (0.8 ft/sec) rollator; 39.15 sec  (0.84 ft/sec) no device     Standardized Balance Assessment   Standardized Balance Assessment Berg Balance Test;Timed Up and Go Test      Berg Balance Test   Sit to Stand Able to stand  independently using hands    Standing Unsupported Able to stand 2 minutes with supervision    Sitting with Back Unsupported but Feet Supported on Floor or Stool Able to sit safely and securely 2 minutes    Stand to Sit Controls descent by using hands    Transfers Able to transfer safely, definite need of hands    Standing Unsupported  with Eyes Closed Unable to keep eyes closed 3 seconds but stays steady    Standing Unsupported with Feet Together Needs help to attain position and unable to hold for 15 seconds   posterior lean   From Standing, Reach Forward with Outstretched Arm Reaches forward but needs supervision    From Standing Position, Pick up Object from Floor Unable to try/needs assist to keep balance    From Standing Position, Turn to Look Behind Over each Shoulder Looks behind one side only/other side shows less weight shift    Turn 360 Degrees Needs close supervision or verbal cueing    Standing Unsupported, Alternately Place Feet on Step/Stool Needs assistance to keep from falling or unable to try    Standing Unsupported, One Foot in Front Needs help to step but can hold 15 seconds    Standing on One Leg Unable to try or needs assist to prevent fall    Total Score 23    Berg comment: Scores <45/56 indicate increased fall risk.      Timed Up and Go Test   TUG Normal TUG    Normal TUG (seconds) 33.37    TUG Comments Scores >13.5 sec indicate increased fall risk; > 30 sec indicates difficulty with ADLs in the home.                        Objective measurements completed on examination: See above findings.                PT Education - 09/20/21 1622  Education provided Yes    Education Details PT eval results; POC; educated that rollator is safest option at this time for gait due to pt's increased fall risk per BERG, gait velocity and TUG scores    Person(s) Educated Patient    Methods Explanation    Comprehension Verbalized understanding              PT Short Term Goals - 09/20/21 1635       PT SHORT TERM GOAL #1   Title Pt will be independent with HEP for improved strength, balance, transfers, and gait.  TARGET 10/21/2021    Time 4    Period Weeks    Status New      PT SHORT TERM GOAL #2   Title Pt will improve 5x sit<>stand to less than or equal to 20 sec to  demonstrate improved functional strength and transfer efficiency.    Baseline 25.53 sec    Time 4    Period Weeks    Status New      PT SHORT TERM GOAL #3   Title Pt will improve TUG score to less than or equal to 28 sec for decreased fall risk.    Baseline 33.37 sec    Time 4    Period Weeks    Status New      PT SHORT TERM GOAL #4   Title Pt will improve gait velocity to at least 1 ft/sec for improved gait efficiency and safety.    Baseline 0.8 ft/sec    Time 4    Period Weeks    Status New      PT SHORT TERM GOAL #5   Title Pt will verbalize understanding of local Parkinson's disease-related resources.    Time 4    Period Weeks    Status New               PT Long Term Goals - 09/20/21 1637       PT LONG TERM GOAL #1   Title Pt will be independent with progression of HEP for improved strength, balance, transfers, and gait.  TARGET 11/18/2021    Time 8    Period Weeks    Status New      PT LONG TERM GOAL #2   Title Pt will improve 5x sit<>stand to less than or equal to 15 sec to demonstrate improved functional strength and transfer efficiency.    Time 8    Period Weeks    Status New      PT LONG TERM GOAL #3   Title Pt will improve TUG score to less than or equal to 20 sec for decreased fall risk.    Time 8    Period Weeks    Status New      PT LONG TERM GOAL #4   Title Pt will improve Berg score to at least 33/56 to decrease fall risk.    Baseline 23/56    Time 8    Period Weeks    Status New      PT LONG TERM GOAL #5   Title Pt will improve gait velocity to at least 1.3 ft/sec for improved gait efficiency and safety.    Time 8    Period Weeks    Status New                    Plan - 09/20/21 1625     Clinical Impression Statement Pt is a 76 year old  female who presents to OPPT with history of Parkinson's disease status post deep brain stimulator (bilateral) in August 2022.  Pt presents with decreased balance, decreased functional  strength, decreased timing and coordination of gait, bradykinesia, abnormal posture, postural instability, history of fall.  Pt is at increased fall risk per Berg, TUG and gait velocity measures.  She does have history of back surgery 2020 and back and leg pain, that she thinks is new and nerve related; she is awaiting approval for MRI for further assessment.  She would benefit from skilled PT to address the above stated deficits to decrease fall risk and to improve overall functional mobility.    Personal Factors and Comorbidities Comorbidity 3+    Comorbidities PMHx significant for fall 05/16/21 with rib fractures, bilat DBS placement 06/2021 Parkinsons disease w/ scheduled brain stimulator placement 06/2021, Hx of orthostatic hypotension, anxiety/depression, RUE/RLE reflex sympathetic dystrophy, osteoporosis, lupus, fibromyalgia, CHF and OA. back surgery 2020 Dec    Examination-Activity Limitations Locomotion Level;Transfers;Stairs;Stand;Dressing    Examination-Participation Restrictions Community Activity;Shop    Stability/Clinical Decision Making Evolving/Moderate complexity    Clinical Decision Making Moderate    Rehab Potential Good    PT Frequency 2x / week    PT Duration 8 weeks   plus eval   PT Treatment/Interventions ADLs/Self Care Home Management;Gait training;Stair training;Functional mobility training;Therapeutic activities;Therapeutic exercise;Balance training;Neuromuscular re-education;Patient/family education;Manual techniques;Passive range of motion    PT Next Visit Plan Initiate HEP:  PWR! Moves in sitting; standing weightshifting, marching, alt step taps; work on gait training for foot clearance.  May need to request family present for some sessions for education.    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[place]) 05/28/2021   Closed fracture of rib of right side with routine healing 05/28/2021   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   Cough 12/20/2020   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   Left shoulder pain 06/19/2020   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   Degenerative lumbar spinal stenosis 11/27/2019   Knee pain, bilateral 08/06/2018   Constipation 08/06/2018   Right lumbar radiculopathy 04/10/2018   Dysphagia 10/09/2017   Chronic cough 07/16/2017   GAD (generalized anxiety disorder) 04/28/2016   Chronic insomnia 03/07/2016   Left Achilles tendinitis 12/08/2015   Parkinson's disease ([place]) 08/25/2015   Advanced care planning/counseling discussion 05/26/2015   Family circumstance 02/23/2015   Health maintenance examination 05/21/2014   MDD (major depressive disorder), single episode, moderate ([place]) 11/12/2013   DDD (degenerative disc disease), lumbar 05/31/2013   Medicare annual wellness visit, subsequent 05/20/2013   HLD (hyperlipidemia) 05/20/2013   Vitamin D deficiency 05/09/2013   Recurrent UTI 01/03/2011   Rash and other nonspecific skin eruption 05/11/2010   GERD 02/22/2010   CAD (coronary artery disease) 01/25/2010   Osteoporosis 11/10/2009   Orthostatic hypotension 12/06/2008   Anemia 09/25/2007   Vitamin B12 deficiency 05/03/2007   Fibromyalgia 05/03/2007    Justina Bertini W., PT 09/20/2021,  4:41 PM  [facility] [address], [place], [place], [address] Phone: [phone]   Fax:   [phone]  Name: [name] MRN: [id number] Date of Birth: October 30, 1946

## 2021-09-21 ENCOUNTER — Ambulatory Visit (INDEPENDENT_AMBULATORY_CARE_PROVIDER_SITE_OTHER): Payer: Medicare Other | Admitting: Pharmacist

## 2021-09-21 ENCOUNTER — Telehealth: Payer: Self-pay | Admitting: Family Medicine

## 2021-09-21 DIAGNOSIS — M810 Age-related osteoporosis without current pathological fracture: Secondary | ICD-10-CM

## 2021-09-21 DIAGNOSIS — I2510 Atherosclerotic heart disease of native coronary artery without angina pectoris: Secondary | ICD-10-CM

## 2021-09-21 DIAGNOSIS — E785 Hyperlipidemia, unspecified: Secondary | ICD-10-CM

## 2021-09-21 DIAGNOSIS — G20 Parkinson's disease: Secondary | ICD-10-CM

## 2021-09-21 DIAGNOSIS — F411 Generalized anxiety disorder: Secondary | ICD-10-CM

## 2021-09-21 DIAGNOSIS — K5900 Constipation, unspecified: Secondary | ICD-10-CM

## 2021-09-21 DIAGNOSIS — G903 Multi-system degeneration of the autonomic nervous system: Secondary | ICD-10-CM

## 2021-09-21 DIAGNOSIS — F321 Major depressive disorder, single episode, moderate: Secondary | ICD-10-CM

## 2021-09-21 MED ORDER — LINACLOTIDE 145 MCG PO CAPS: 145.0000 ug | ORAL_CAPSULE | Freq: Every day | ORAL | 1 refills | Status: DC

## 2021-09-21 NOTE — Patient Instructions (Signed)
Visit Information  Phone number for Pharmacist: [phone]  Thank you for meeting with me to discuss your medications! I look forward to working with you to achieve your health care goals. Below is a summary of what we talked about during the visit:   Goals Addressed             This Visit's Progress    Manage My Medicine       Timeframe:  Long-Range Goal Priority:  High Start Date:     09/21/21                        Expected End Date:   09/21/22                    Follow Up Date April 2023   - call for medicine refill 2 or 3 days before it runs out - call if I am sick and can't take my medicine - keep a list of all the medicines I take; vitamins and herbals too - use a pillbox to sort medicine  -use Tylenol in AM and Tramadol in PM to help with daytime fatigue  Why is this important?   These steps will help you keep on track with your medicines.   Notes:         Care Plan : [place]  Updates made by Charlton Haws, RPH since 09/21/2021 12:00 AM     Problem: Hyperlipidemia, Coronary Artery Disease, Depression, Anxiety, and Osteoporosis, Parkinson's Disease, Constipation   Priority: High     Long-Range Goal: Disease management   Start Date: 09/21/2021  Expected End Date: 09/21/2022  This Visit's Progress: On track  Priority: High  Note:   Current Barriers:  Unable to independently monitor therapeutic efficacy Unable to achieve control of constipation  Suboptimal therapeutic regimen for CAD  Pharmacist Clinical Goal(s):  Patient will achieve adherence to monitoring guidelines and medication adherence to achieve therapeutic efficacy achieve control of constipation as evidenced by patient report adhere to plan to optimize therapeutic regimen for CAD as evidenced by report of adherence to recommended medication management changes through collaboration with PharmD and provider.   Interventions: 1:1 collaboration with [name], MD  regarding development and update of comprehensive plan of care as evidenced by provider attestation and co-signature Inter-disciplinary care team collaboration (see longitudinal plan of care) Comprehensive medication review performed; medication list updated in electronic medical record  Orthostatic hypotension (Goal: maintain BP >100/60) -Controlled - pt reports she does not have "spells" like she used to since taking fludrocortisone; she is not checking BP at home regularly -Home BP: not checking unless feels like its low -Current treatment  Fludrocortisone 0.1 mg daily AM -Medications previously tried: Northera  -Counseled on indication/benefits of fludrocortisone -Advised pt to monitor BP at home a few days a week to ensure it is at goal  Depression/Anxiety (Goal: manage symptoms) -Controlled - pt reports mood is under control currently; she does not endorse worsening anxiety since reducing clonazepam a few weeks ago; she reports overall fatigue has not improved since reducing clonazepam either -Current treatment: Sertraline 100 mg daily Clonazepam 0.5 mg - 1/2 tab AM and 1 tab PM -Medications previously tried/failed: amitriptyline, duloxetine, imipramine -PHQ9: 9 (10/2020) -GAD7: 6 (10/2020) -Connected with PCP/neurology for mental health support -Educated on Benefits of medication for symptom control -Recommended to continue current medication  Osteoporosis (Goal prevent fractures) -Controlled - pt is up-to-date with Prolia injections; she  is compliant with calcium/Vitamin D regimen -Last DEXA Scan: 11/04/19   T-Score femoral neck: -2.8  T-Score lumbar spine: -0.1 -Patient is a candidate for pharmacologic treatment due to T-Score < -2.5 in femoral neck -Current treatment  Prolia 60 mg q6 months (started 03/2017, last given 08/30/21) Calcium-Vitamin D 600-400  Vitamin D 1000 IU -Recommend weight-bearing and muscle strengthening exercises for building and maintaining bone  density. -Recommended to continue current medication  Parkinson's disease (Goal: manage syptoms) -Controlled - pt reports she has not need needed Sinemet in several weeks; she still endorses fatigue during the day -DBS 07/07/21 -Current treatment  Carbidopa-levodopa 25-100 mg: take extra 1/2 PRN -pt hasn't needed -Medications previously tried: pramipexole  -Recommended to continue current medication  Pain (Goal: manage symptoms) -Controlled - pt reports knee pain, taking daily Tylenol and Tramadol and just recently started PT for Parkinson's -Hx lumbar radiculopathy, DDD and fibromyalgia -Current treatment  Tylenol 650 mg - 2 every every 6 hours (usually BID) Tramadol 50 mg BID -Medications previously tried: lyrica, lidocaine  -Counseled on tramadol side effects including fatigue;  -Advised to use Tylenol in AM and Tramadol in PM to help with daytime fatigue  Hyperlipidemia / CAD: (LDL goal < 70) -Not ideally controlled - LDL is above goal gived history of MI/CAD; there is no record of prior statin use; pt is currently recovering from DBS and adjusting to Parkinson's medication changes, she is not willing to change/start medications currently -Hx MI 2011, aortic atherosclerosis, stable off statin per PCP -Current treatment: None -Medications previously tried: none  -Considered starting statin therapy; will defer to later date given current Parkinson's treatment adjustments  Chronic constipation (Goal: manage symptoms) -Uncontrolled - pt reports Miralax, fiber supplements hurt her stomach and docusate does not work; she is interested in starting an Rx option for constipation -Recommend Linzess 145 mcg daily - will discuss with PCP  Health Maintenance -Vaccine gaps: Shingrix, covid booster, Flu, TDAP -Pt is planning to get Flu shot 11/10 at nurse visit -Current therapy:  Artificial tears Latanoprost 0.005% eye drops HS Vitamin B12 1000 mg IM q30 days -Recommended to continue  current medication  Patient Goals/Self-Care Activities Patient will:  - take medications as prescribed -focus on medication adherence by pill box -check blood pressure 1-2 times weekly      Ms. Carreras was given information about Chronic Care Management services today including:  CCM service includes personalized support from designated clinical staff supervised by her physician, including individualized plan of care and coordination with other care providers 24/7 contact phone numbers for assistance for urgent and routine care needs. Standard insurance, coinsurance, copays and deductibles apply for chronic care management only during months in which we provide at least 20 minutes of these services. Most insurances cover these services at 100%, however patients may be responsible for any copay, coinsurance and/or deductible if applicable. This service may help you avoid the need for more expensive face-to-face services. Only one practitioner may furnish and bill the service in a calendar month. The patient may stop CCM services at any time (effective at the end of the month) by phone call to the office staff.  Patient agreed to services and verbal consent obtained.   Patient verbalizes understanding of instructions provided today and agrees to view in [place].  Telephone follow up appointment with pharmacy team member scheduled for: 6 months  [name], PharmD, [place], CPP Clinical Pharmacist [facility] [phone]

## 2021-09-21 NOTE — Telephone Encounter (Signed)
Will forward to [place] in case there's patient assistance available for linzess. If not may try amitiza 82mcg BID.

## 2021-09-21 NOTE — Telephone Encounter (Signed)
Pt stated that dr Darnell Level. Put a order of medication in at her pharmacy and pt stated that the medicine is to expensive can he put in a order for a cheaper medication (linaclotide) is what she has a order for that is to expensive

## 2021-09-21 NOTE — Progress Notes (Signed)
Chronic Care Management Pharmacy Note  09/21/2021 Name:  [name] MRN:  [id number] DOB:  16-Aug-1946  Summary: -Pt reports Miralax, docusate, fiber supplements do not work/hurt her stomach, she requests an Rx to help with chronic constipation -Pt is not taking carbidopa-levodopa anymore (option for 1/2 tab PRN, has not needed lately); she does report persistent fatigue even after reducing AM clonazepam dose -Pt is not checking BP at home, but has not had any low BP "spells" since taking fludrocortisone  Recommendations/Changes made from today's visit: -Recommend Linzess 145 mcg daily (pt requests Rx sent to [facility]) -Advised pt to check BP a few times a week   Subjective: [name] is an 75 y.o. year old female who is a primary patient of [name], MD.  The CCM team was consulted for assistance with disease management and care coordination needs.    Engaged with patient by telephone for initial visit in response to provider referral for pharmacy case management and/or care coordination services.   Consent to Services:  The patient was given the following information about Chronic Care Management services today, agreed to services, and gave verbal consent: 1. CCM service includes personalized support from designated clinical staff supervised by the primary care provider, including individualized plan of care and coordination with other care providers 2. 24/7 contact phone numbers for assistance for urgent and routine care needs. 3. Service will only be billed when office clinical staff spend 20 minutes or more in a month to coordinate care. 4. Only one practitioner may furnish and bill the service in a calendar month. 5.The patient may stop CCM services at any time (effective at the end of the month) by phone call to the office staff. 6. The patient will be responsible for cost sharing (co-pay) of up to 20% of the service fee (after annual deductible is met). Patient agreed  to services and consent obtained.  Patient Care Team: [name], MD as PCP - General (Family Medicine) Domingo Pulse, MD (Urology) [name], Eustace Quail, DO as Consulting Physician (Neurology) Karren Burly Deirdre Peer, MD as Referring Physician (Ophthalmology) [name], Eustace Quail, DO as Consulting Physician (Neurology) Charlton Haws, [facility] as Pharmacist (Pharmacist)  Recent office visits: 07/29/21-PCP-Patient presented for 2 month follow up Parkinson's Disease. B-12 shot today,no medication changes follow up 3 months   05/27/21-PCP-Patient presented for follow up fall and rash. Restart Northera 152m TID,titrate to effect.Start Nystatin -triamcinolone ointment.Follow up 2-3 months  04/27/21-PCP-Patient presented for follow up Parkinson's Disease. B-12 shot today,Northera became unaffordable. using walker regular now.follow up 6 months  Recent consult visits: 09/05/21-Dr [name], Neurology-Patient presented for follow up Parkinson's Disease. "I would really like to see her backing down on the clonazepam to see if that helped with the daytime fatigue.  For now, I asked her to take just half a tablet of clonazepam in the morning and 1 tablet of the clonazepam at nighttime"; Referral for physical therapy and speech therapy   08/22/21-Dr [name], Neurology-Patient presented for follow up Parkinson's Disease. Discontinue pramipexole, take extra 1/2 levodopa as needed, currently on Florinef 0.129mtake 1 tablet daily (pt stopped Northera on her own -cost)  08/08/21-Dr [name], Neurology-Patient presented for follow up Parkinson's Disease. DBS programming performed (s/p placement 07/07/21); Continue carbidopa/levodopa 50/200 1 at bedtime,decrease pramipexole 0.5 mg, half tablet 3 times per day for a week and then half tablet twice a day for a week and then half tablet once a day for a week and then discontinue pramipexole  05/23/21-Plastic Surgery-Patient presented for foreign body eyelid, no data  found. 04/20/21-Plastic Surgery- no data found 04/18/21-Neurology-Patient presented for follow up Parkinson's Disease. 03/30/21-Neurosurgery-no data found   Hospital visits: 07/21/21 thru 07/23/21-[facility] presented for abnormal abdominal movements s/p DBS. Neurolog consulted, Reduced carbidopa-levodopa at discharge.  07/14/21 thru 07/15/21-[facility]-Patient presented for deep brain stimulator placement-30 hour observation. IV- Ondansetron, Aprepitant and Dexamethasone  07/07/21-07/08/21-[facility]-Patient presented for surgery for bilateral deep brain stimulator placement.No complications,post op CT ordered,observation and discharge to home with Tramadol 9m take 1 tablet every 6 hours as needed for pain.Stop taking cephalexin.  06/30/21-[facility]-Patient presented for Minor placement of Fiducial. No medication changes.  05/16/21 thru 05/17/21-[facility]-Patient presented for fall after dizziness, lightheadedness. CBC,UA,EKG, Started on Rocephin in the ED, continue.She is on home Klonopin 0.5 mg twice daily which can increase fall risk, defer to neurology to taper off if indicated. Continue home Klonopin and Zoloft  [facility]. Previous labs reviewed-discussed using ice, take pain medication available at home,monitor BP at home, discharged to home  Objective:  Lab Results  Component Value Date   CREATININE 0.68 07/22/2021   BUN 21 07/22/2021   GFR 66.47 02/08/2021   GFRNONAA >60 07/22/2021   GFRAA >60 11/24/2019   NA 142 07/22/2021   K 3.4 (L) 07/22/2021   CALCIUM 9.4 07/22/2021   CO2 24 07/22/2021   GLUCOSE 100 (H) 07/22/2021    Lab Results  Component Value Date/Time   HGBA1C 5.6 05/13/2013 08:57 AM   HGBA1C 5.8 02/11/2007 04:25 PM   GFR 66.47 02/08/2021 11:01 AM   GFR 89.02 10/25/2020 10:30 AM    Last diabetic Eye exam: No results found for: HMDIABEYEEXA   Last diabetic Foot exam: No results found for: HMDIABFOOTEX   Lab Results  Component Value Date   CHOL 193 10/25/2020   HDL 53.50 10/25/2020   LDLCALC 113 (H) 10/25/2020   LDLDIRECT 105.5 07/20/2010   TRIG 132.0 10/25/2020   CHOLHDL 4 10/25/2020    Hepatic Function Latest Ref Rng & Units 07/22/2021 07/21/2021 05/16/2021  Total Protein 6.5 - 8.1 g/dL 7.0 6.5 6.7  Albumin 3.5 - 5.0 g/dL 3.8 3.9 3.5  AST 15 - 41 U/L '31 27 16  ' ALT 0 - 44 U/L 6 <5 <5  Alk Phosphatase 38 - 126 U/L 70 65 63  Total Bilirubin 0.3 - 1.2 mg/dL 0.6 1.4(H) 0.6  Bilirubin, Direct 0.0 - 0.3 mg/dL - - -    Lab Results  Component Value Date/Time   TSH 0.49 10/04/2018 08:56 AM   TSH 1.30 11/09/2016 12:12 PM    CBC Latest Ref Rng & Units 07/22/2021 07/21/2021 07/07/2021  WBC 4.0 - 10.5 K/uL 12.3(H) 12.3(H) 6.0  Hemoglobin 12.0 - 15.0 g/dL 11.9(L) 11.1(L) 12.3  Hematocrit 36.0 - 46.0 % 38.9 36.1 38.4  Platelets 150 - 400 K/uL 399 404(H) 312    Lab Results  Component Value Date/Time   VD25OH 32.80 10/25/2020 10:30 AM   VD25OH 46.02 10/14/2019 08:51 AM    Clinical ASCVD: Yes  The 10-year ASCVD risk score (Arnett DK, et al., 2019) is: 16.9%   Values used to calculate the score:     Age: [age]years     Sex: Female     Is Non-Hispanic African American: No     Diabetic: No     Tobacco smoker: No     Systolic Blood Pressure: 1811mmHg     Is  BP treated: No     HDL Cholesterol: 53.5 mg/dL     Total Cholesterol: 193 mg/dL    Depression screen [facility] 2/9 10/27/2020 10/13/2019 10/04/2018  Decreased Interest 0 0 0  Down, Depressed, Hopeless 0 0 0  PHQ - 2 Score 0 0 0  Altered sleeping 2 0 2  Tired, decreased energy 1 0 0  Change in appetite 2 0 1  Feeling bad or failure about yourself  1 0 0  Trouble concentrating 1 0 0  Moving slowly or fidgety/restless 2 0 0  Suicidal thoughts 0 0 0  PHQ-9 Score 9 0 3  Difficult doing work/chores - Not difficult at all Not difficult at all  Some recent data might be hidden     GAD 7 : Generalized Anxiety Score 10/27/2020  Nervous, Anxious, on Edge 1  Control/stop worrying 1  Worry too much - different things 1  Trouble relaxing 1  Restless 1  Easily annoyed or irritable 1  Afraid - awful might happen 0  Total GAD 7 Score 6     Social History   Tobacco Use  Smoking Status Never  Smokeless Tobacco Never   BP Readings from Last 3 Encounters:  09/05/21 124/72  08/22/21 115/68  08/08/21 139/70   Pulse Readings from Last 3 Encounters:  09/05/21 74  08/22/21 80  08/08/21 81   Wt Readings from Last 3 Encounters:  09/05/21 151 lb (68.5 kg)  08/22/21 151 lb (68.5 kg)  08/08/21 151 lb 12.8 oz (68.9 kg)   BMI Readings from Last 3 Encounters:  09/05/21 24.37 kg/m  08/22/21 24.37 kg/m  08/08/21 25.26 kg/m    Assessment/Interventions: Review of patient past medical history, allergies, medications, health status, including review of consultants reports, laboratory and other test data, was performed as part of comprehensive evaluation and provision of chronic care management services.   SDOH:  (Social Determinants of Health) assessments and interventions performed: Yes  SDOH Screenings   Alcohol Screen: Not on file  Depression (PHQ2-9): Medium Risk   PHQ-2 Score: 9  Financial Resource Strain: Not on file  Food Insecurity: Not on file  Housing: Not on file  Physical Activity: Not on file  Social Connections: Not on file  Stress: Not on file  Tobacco Use: Low Risk    Smoking Tobacco Use: Never   Smokeless Tobacco Use: Never   Passive Exposure: Not on file  Transportation Needs: Not on file    [place]  Allergies  Allergen Reactions   Amitriptyline Other (See Comments)    Sedated next morning   Ciprofloxacin Nausea And Vomiting   Cymbalta [Duloxetine Hcl] Other (See Comments)    Worsened depression   Iohexol Other (See Comments)     Code: HIVES, Desc: PT developed 2 hives, followed by SOB, severe headache post 87cc's Omnipaque  300., Onset Date: 11572620    Lyrica [Pregabalin] Other (See Comments)    Tried during hospitalization - unsure effects but unable to tolerate   Imipramine Hcl Rash   Iodine Rash   Lidocaine Hcl Rash   Morphine Sulfate Rash   Neosporin [Neomycin-Bacitracin Zn-Polymyx] Rash and Other (See Comments)    Worsened skin breaking out   Sulfamethoxazole Rash   Tetracyclines & Related Rash    Medications Reviewed Today     Reviewed by Charlton Haws, [facility] (Pharmacist) on 09/21/21 at [place] List Status: <None>   Medication Order Taking? Sig Documenting Provider Last Dose Status Informant  acetaminophen (TYLENOL) 650  MG CR tablet [id number] Yes Take 2 tablets (1,300 mg total) by mouth every 8 (eight) hours as needed for pain. Dwyane Dee, MD Taking Active Child  AMBULATORY Baruch Gouty MEDICATION [id number] Yes Lift chair Dx:  Tomasa Rand, DO Taking Active Child  Calcium Carbonate-Vitamin D (CALCIUM 600/VITAMIN D) 600-400 MG-UNIT chew tablet [id number] Yes Chew 1 tablet by mouth daily. [name], MD Taking Active Child           Med Note Wilmon Pali, [name]   Thu Jun 23, 2021  3:53 PM)    carbidopa-levodopa (SINEMET IR) 25-100 MG tablet [id number] Yes TAKE 1 TABLETS BY MOUTH AT 6 AM ,0.5 TABLET AT 9:30 AM, 0.5 PM AND 4:30 PM. MAY TAKE AN EXTRA ONE-HALF TABLET BY MOUTH AS NEEDED IN THE EVENING [name], Eustace Quail, DO Taking Active   Cholecalciferol (VITAMIN D3) 25 MCG (1000 UT) CAPS [id number] Yes Take 1 capsule (1,000 Units total) by mouth daily. [name], MD Taking Active Child           Med Note Wilmon Pali, [name]   Thu Jun 23, 2021  3:54 PM)    clonazePAM (KLONOPIN) 0.5 MG tablet [id number] Yes Take 0.5 tablets (0.25 mg total) by mouth every morning AND 1 tablet (0.5 mg total) at bedtime. [name], MD Taking Active   clotrimazole-betamethasone Donalynn Furlong) cream [id number] Yes Apply 1 application topically daily. [name], MD Taking Active    cyanocobalamin (,VITAMIN B-12,) 1000 MCG/ML injection [id number] Yes INJECT 1 ML (1,000 MCG TOTAL) INTO THE MUSCLE EVERY 30 (THIRTY) DAYS. [name], MD Taking Active Child           Med Note Malena Catholic Sep 21, 2021  9:09 AM)    denosumab (PROLIA) 60 MG/ML SOSY injection [id number] Yes Inject 60 mg into the skin every 6 (six) months. [provider] Taking Active Child           Med Note Malena Catholic Sep 21, 2021  9:10 AM) Last injection 08/30/2021  fludrocortisone (FLORINEF) 0.1 MG tablet [id number] Yes Take 1 tablet (0.1 mg total) by mouth daily. [name], MD Taking Active Child  Glycerin-Hypromellose-PEG 400 (ARTIFICIAL TEARS) 0.2-0.2-1 % SOLN [id number] Yes Place 1 drop into both eyes daily as needed (dry eyes). [provider] Taking Active Child  latanoprost (XALATAN) 0.005 % ophthalmic solution [id number] Yes Place 1 drop into both eyes at bedtime. [provider] Taking Active Child  sertraline (ZOLOFT) 100 MG tablet [id number] Yes TAKE 1 TABLET BY MOUTH  DAILY  Patient taking differently: No sig reported   [name], MD Taking Active   traMADol (ULTRAM) 50 MG tablet [id number] Yes Take 1 tablet (50 mg total) by mouth every 6 (six) hours as needed for moderate pain. 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   Left shoulder pain 06/19/2020   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  Degenerative lumbar spinal stenosis 11/27/2019   Knee pain, bilateral  08/06/2018   Constipation 08/06/2018   Right lumbar radiculopathy 04/10/2018   Dysphagia 10/09/2017   Chronic cough 07/16/2017   GAD (generalized anxiety disorder) 04/28/2016   Chronic insomnia 03/07/2016   Left Achilles tendinitis 12/08/2015   Parkinson's disease ([place]) 08/25/2015   Advanced care planning/counseling discussion 05/26/2015   Family circumstance 02/23/2015   Health maintenance examination 05/21/2014   MDD (major depressive disorder), single episode, moderate ([place]) 11/12/2013   DDD (degenerative disc disease), lumbar 05/31/2013   Medicare annual wellness visit, subsequent 05/20/2013   HLD (hyperlipidemia) 05/20/2013   Vitamin D deficiency 05/09/2013   Recurrent UTI 01/03/2011   Rash and other nonspecific skin eruption 05/11/2010   GERD 02/22/2010   CAD (coronary artery disease) 01/25/2010   Osteoporosis 11/10/2009   Orthostatic hypotension 12/06/2008   Anemia 09/25/2007   Vitamin B12 deficiency 05/03/2007   Fibromyalgia 05/03/2007    Immunization History  Administered Date(s) Administered   Fluad Quad(high Dose 65+) 08/12/2020   Influenza Split 08/18/2011, 10/11/2012   Influenza Whole 09/25/2007, 09/08/2008, 08/23/2009, 09/02/2010   Influenza, High Dose Seasonal PF 09/28/2019   Influenza,inj,Quad PF,6+ Mos 09/02/2013, 08/13/2014, 10/28/2015, 11/14/2016, 09/18/2017, 08/15/2018   Influenza,inj,quad, With Preservative 09/27/2018   PFIZER(Purple Top)SARS-COV-2 Vaccination 01/22/2020, 02/19/2020   Pneumococcal Conjugate-13 05/21/2014   Pneumococcal Polysaccharide-23 05/26/2015   Tdap 08/18/2011   Zoster, Live 10/12/2010    Conditions to be addressed/monitored:  Hyperlipidemia, Coronary Artery Disease, Depression, Anxiety, and Osteoporosis, Parkinson's Disease, Constipation  Care Plan : [place]  Updates made by Charlton Haws, [place] since 09/21/2021 12:00 AM     Problem: Hyperlipidemia, Coronary Artery Disease, Depression, Anxiety, and  Osteoporosis, Parkinson's Disease, Constipation   Priority: High     Long-Range Goal: Disease management   Start Date: 09/21/2021  Expected End Date: 09/21/2022  This Visit's Progress: On track  Priority: High  Note:   Current Barriers:  Unable to independently monitor therapeutic efficacy Unable to achieve control of constipation  Suboptimal therapeutic regimen for CAD  Pharmacist Clinical Goal(s):  Patient will achieve adherence to monitoring guidelines and medication adherence to achieve therapeutic efficacy achieve control of constipation as evidenced by patient report adhere to plan to optimize therapeutic regimen for CAD as evidenced by report of adherence to recommended medication management changes through collaboration with PharmD and provider.   Interventions: 1:1 collaboration with [name], MD regarding development and update of comprehensive plan of care as evidenced by provider attestation and co-signature Inter-disciplinary care team collaboration (see longitudinal plan of care) Comprehensive medication review performed; medication list updated in electronic medical record  Orthostatic hypotension (Goal: maintain BP >100/60) -Controlled - pt reports she does not have "spells" like she used to since taking fludrocortisone; she is not checking BP at home regularly -Home BP: not checking unless feels like its low -Current treatment  Fludrocortisone 0.1 mg daily AM -Medications previously tried: Northera  -Counseled on indication/benefits of fludrocortisone -Advised pt to monitor BP at home a few days a week to ensure it is at goal  Depression/Anxiety (Goal: manage symptoms) -Controlled - pt reports mood is under control currently; she does not endorse worsening anxiety since reducing clonazepam a few weeks ago; she reports overall fatigue has not improved since reducing clonazepam either -Current treatment: Sertraline 100 mg daily Clonazepam 0.5 mg - 1/2 tab  AM and 1 tab PM -Medications previously tried/failed: amitriptyline, duloxetine, imipramine -PHQ9: 9 (10/2020) -GAD7: 6 (10/2020) -Connected with PCP/neurology for mental  health support -Educated on Benefits of medication for symptom control -Recommended to continue current medication  Osteoporosis (Goal prevent fractures) -Controlled - pt is up-to-date with Prolia injections; she is compliant with calcium/Vitamin D regimen -Last DEXA Scan: 11/04/19   T-Score femoral neck: -2.8  T-Score lumbar spine: -0.1 -Patient is a candidate for pharmacologic treatment due to T-Score < -2.5 in femoral neck -Current treatment  Prolia 60 mg q6 months (started 03/2017, last given 08/30/21) Calcium-Vitamin D 600-400  Vitamin D 1000 IU -Recommend weight-bearing and muscle strengthening exercises for building and maintaining bone density. -Recommended to continue current medication  Parkinson's disease (Goal: manage syptoms) -Controlled - pt reports she has not need needed Sinemet in several weeks; she still endorses fatigue during the day -DBS 07/07/21 -Current treatment  Carbidopa-levodopa 25-100 mg: take extra 1/2 PRN -pt hasn't needed -Medications previously tried: pramipexole  -Recommended to continue current medication  Pain (Goal: manage symptoms) -Controlled - pt reports knee pain, taking daily Tylenol and Tramadol and just recently started PT for Parkinson's -Hx lumbar radiculopathy, DDD and fibromyalgia -Current treatment  Tylenol 650 mg - 2 every every 6 hours (usually BID) Tramadol 50 mg BID -Medications previously tried: lyrica, lidocaine  -Counseled on tramadol side effects including fatigue;  -Advised to use Tylenol in AM and Tramadol in PM to help with daytime fatigue  Hyperlipidemia / CAD: (LDL goal < 70) -Not ideally controlled - LDL is above goal gived history of MI/CAD; there is no record of prior statin use; pt is currently recovering from DBS and adjusting to Parkinson's  medication changes, she is not willing to change/start medications currently -Hx MI 2011, aortic atherosclerosis, stable off statin per PCP -Current treatment: None -Medications previously tried: none  -Considered starting statin therapy; will defer to later date given current Parkinson's treatment adjustments  Chronic constipation (Goal: manage symptoms) -Uncontrolled - pt reports Miralax, fiber supplements hurt her stomach and docusate does not work; she is interested in starting an Rx option for constipation -Recommend Linzess 145 mcg daily - will discuss with PCP  Health Maintenance -Vaccine gaps: Shingrix, covid booster, Flu, TDAP -Pt is planning to get Flu shot 11/10 at nurse visit -Current therapy:  Artificial tears Latanoprost 0.005% eye drops HS Vitamin B12 1000 mg IM q30 days -Recommended to continue current medication  Patient Goals/Self-Care Activities Patient will:  - take medications as prescribed -focus on medication adherence by pill box -check blood pressure 1-2 times weekly      Medication Assistance: None required.  Patient affirms current coverage meets needs.  Compliance/Adherence/Medication fill history: Care Gaps: None  Star-Rating Drugs: None  Patient's preferred pharmacy is:  [id number] - [place], [address] AT [address] [place] [address] Phone: [phone] Fax: [phone]  [facility] ([facility] - [place], [address] [place] [address] Phone: [phone] Fax: [phone]  Uses pill box? Yes Pt endorses 100% compliance  We discussed: Current pharmacy is preferred with insurance plan and patient is satisfied with pharmacy services Patient decided to: Continue current medication management strategy  Care Plan and Follow Up Patient Decision:  Patient agrees to Care Plan and Follow-up.  Plan: Telephone  follow up appointment with care management team member scheduled for:  6 months  [name], PharmD, [place], CPP Clinical Pharmacist [facility] [phone]

## 2021-09-21 NOTE — Addendum Note (Signed)
Addended by: [name] on: 09/21/2021 10:48 AM   Modules accepted: Orders

## 2021-09-26 ENCOUNTER — Ambulatory Visit: Payer: Medicare Other

## 2021-09-26 ENCOUNTER — Ambulatory Visit: Payer: Medicare Other | Admitting: Physical Therapy

## 2021-09-26 ENCOUNTER — Encounter: Payer: Self-pay | Admitting: Family Medicine

## 2021-09-26 DIAGNOSIS — E785 Hyperlipidemia, unspecified: Secondary | ICD-10-CM | POA: Diagnosis not present

## 2021-09-26 DIAGNOSIS — I2510 Atherosclerotic heart disease of native coronary artery without angina pectoris: Secondary | ICD-10-CM | POA: Diagnosis not present

## 2021-09-26 DIAGNOSIS — M810 Age-related osteoporosis without current pathological fracture: Secondary | ICD-10-CM | POA: Diagnosis not present

## 2021-09-26 DIAGNOSIS — F321 Major depressive disorder, single episode, moderate: Secondary | ICD-10-CM | POA: Diagnosis not present

## 2021-09-27 ENCOUNTER — Ambulatory Visit (HOSPITAL_COMMUNITY)
Admission: RE | Admit: 2021-09-27 | Discharge: 2021-09-27 | Disposition: A | Discharge status: Home / Self Care | Payer: Medicare Other | Source: Ambulatory Visit | Attending: Neurosurgery | Admitting: Neurosurgery

## 2021-09-27 ENCOUNTER — Other Ambulatory Visit: Payer: Self-pay

## 2021-09-27 ENCOUNTER — Encounter: Payer: Self-pay | Admitting: Family Medicine

## 2021-09-27 DIAGNOSIS — M5416 Radiculopathy, lumbar region: Secondary | ICD-10-CM | POA: Diagnosis not present

## 2021-09-27 DIAGNOSIS — M4807 Spinal stenosis, lumbosacral region: Secondary | ICD-10-CM | POA: Diagnosis not present

## 2021-09-27 DIAGNOSIS — M48061 Spinal stenosis, lumbar region without neurogenic claudication: Secondary | ICD-10-CM | POA: Diagnosis not present

## 2021-09-27 DIAGNOSIS — M5117 Intervertebral disc disorders with radiculopathy, lumbosacral region: Secondary | ICD-10-CM | POA: Diagnosis not present

## 2021-09-27 DIAGNOSIS — M5116 Intervertebral disc disorders with radiculopathy, lumbar region: Secondary | ICD-10-CM | POA: Diagnosis not present

## 2021-09-27 MED ORDER — GADOBUTROL 1 MMOL/ML IV SOLN
6.0000 mL | Freq: Once | INTRAVENOUS | Status: AC | PRN
  Administered 2021-09-27: 6 mL via INTRAVENOUS

## 2021-09-28 ENCOUNTER — Ambulatory Visit: Payer: Self-pay | Admitting: Physical Therapy

## 2021-10-03 ENCOUNTER — Telehealth: Payer: Self-pay | Admitting: Neurology

## 2021-10-03 DIAGNOSIS — M79604 Pain in right leg: Secondary | ICD-10-CM | POA: Diagnosis not present

## 2021-10-03 DIAGNOSIS — R29898 Other symptoms and signs involving the musculoskeletal system: Secondary | ICD-10-CM | POA: Diagnosis not present

## 2021-10-03 NOTE — Telephone Encounter (Signed)
Ritchie able to contact patient and help her to get a new battery pack for DBS

## 2021-10-03 NOTE — Telephone Encounter (Signed)
The charger that she usea to charge the DBS battery will not charge and she need to charge her battery. What does she need to do?

## 2021-10-03 NOTE — Telephone Encounter (Signed)
Reached out to [name] at [facility] to see if he can call and help her to troubleshoot. Patient says it has not worked as far as Writer since she did he r MRI on Wednesday

## 2021-10-06 ENCOUNTER — Ambulatory Visit: Payer: Medicare Other

## 2021-10-10 DIAGNOSIS — M5416 Radiculopathy, lumbar region: Secondary | ICD-10-CM | POA: Diagnosis not present

## 2021-10-10 DIAGNOSIS — R030 Elevated blood-pressure reading, without diagnosis of hypertension: Secondary | ICD-10-CM | POA: Diagnosis not present

## 2021-10-11 ENCOUNTER — Encounter: Payer: Self-pay | Admitting: Physical Therapy

## 2021-10-11 ENCOUNTER — Other Ambulatory Visit: Payer: Self-pay

## 2021-10-11 ENCOUNTER — Ambulatory Visit: Payer: Medicare Other | Attending: Neurosurgery | Admitting: Physical Therapy

## 2021-10-11 DIAGNOSIS — M5441 Lumbago with sciatica, right side: Secondary | ICD-10-CM | POA: Insufficient documentation

## 2021-10-11 DIAGNOSIS — R2689 Other abnormalities of gait and mobility: Secondary | ICD-10-CM | POA: Insufficient documentation

## 2021-10-11 DIAGNOSIS — M6281 Muscle weakness (generalized): Secondary | ICD-10-CM | POA: Diagnosis not present

## 2021-10-11 DIAGNOSIS — G8929 Other chronic pain: Secondary | ICD-10-CM | POA: Insufficient documentation

## 2021-10-11 DIAGNOSIS — R2681 Unsteadiness on feet: Secondary | ICD-10-CM | POA: Diagnosis not present

## 2021-10-11 DIAGNOSIS — R293 Abnormal posture: Secondary | ICD-10-CM | POA: Diagnosis not present

## 2021-10-11 NOTE — Patient Instructions (Signed)
Access Code: [facility] URL: [email or web address] Date: 10/11/2021 Prepared by: Estill Bamberg April Thurnell Garbe  Exercises Supine Posterior Pelvic Tilt - 1 x daily - 7 x weekly - 3 sets - 10 reps Supine Lower Trunk Rotation - 1 x daily - 7 x weekly - 3 sets - 20 sec hold Hooklying Clamshell with Resistance - 1 x daily - 7 x weekly - 3 sets - 10 reps Standing 'L' Stretch at Counter - 1 x daily - 7 x weekly - 1 sets - 2 reps - 30 sec hold

## 2021-10-11 NOTE — Therapy (Signed)
[place]. [place], [place], [address] Phone: [phone]   Fax:  [phone]  Physical Therapy Evaluation  Patient Details  Name: [name] MRN: [id number] Date of Birth: July 23, 1946 Referring Provider (PT): Marvis Moeller, NP   Encounter Date: 10/11/2021    Past Medical History:  Diagnosis Date   Allergy    ANEMIA-NOS 09/25/2007   Anxiety    Arthritis    B12 DEFICIENCY 05/03/2007   Blood transfusion without reported diagnosis    CAD (coronary artery disease) 01/2010   MI, Nishan   Cardiomyopathy ([place]) 02/08/2010   H/o this 2012 after urosepsis, no recurrence.    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   OSTEOPOROSIS 08/2009   bisphosphonate on hold 2/2 dysphagia, on reclast done in August each year   Parkinson's disease ([place]) 08/25/2015   Dx Dr [name] 07/2015    PONV (postoperative nausea and vomiting)    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   Takotsubo cardiomyopathy 2008   due to E coli urosepsis    Past Surgical History:  Procedure Laterality Date   ABDOMINAL HYSTERECTOMY  1970s   IUD infection - first partial then with oophorectomy (cysts), complication - low blood pressure   CATARACT EXTRACTION Bilateral    CHOLECYSTECTOMY     complication - low blood pressure   COLONOSCOPY  06/2008   h/o polyps but latest WNL, rec rpt 10 yrs Olevia Perches)   COLONOSCOPY  11/2018    multiple TAs (10 polyps total), rpt 2 yrs Fuller Plan)   CYSTOSCOPY  12/2013   abx treatment for recurrent cystitis   DEXA  04/2013   T -2.9 @ femur, -1.6 @ spine   DEXA  04/2017   T -2.9 hip, -0.7 spine   ESOPHAGOGASTRODUODENOSCOPY  12/2017   WNL, regardless esophagus dilated, small HH Fuller Plan)   EYE SURGERY     LUMBAR LAMINECTOMY/DECOMPRESSION MICRODISCECTOMY Right 11/27/2019   Right Lumbar Four-Five foraminotomy;  Erline Levine, MD)   MINOR PLACEMENT OF FIDUCIAL N/A 06/30/2021   Procedure: MINOR PLACEMENT OF FIDUCIAL;  Surgeon: Erline Levine, MD;  Location: [facility];  Service: Neurosurgery;  Laterality: N/A;  Minor room   PTOSIS REPAIR Bilateral 10/2020   Plastic Surgery   PULSE GENERATOR IMPLANT Right 07/14/2021   Procedure: UNILATERAL PULSE GENERATOR IMPLANT;  Surgeon: Erline Levine, MD;  Location: [facility];  Service: Neurosurgery;  Laterality: Right;   SUBTHALAMIC STIMULATOR INSERTION Bilateral 07/07/2021   Procedure: Deep brain stimulator placement;  Surgeon: Erline Levine, MD;  Location: [facility];  Service: Neurosurgery;  Laterality: Bilateral;   TUBAL LIGATION     UPPER GASTROINTESTINAL ENDOSCOPY      There were no vitals filed for this visit.    Subjective Assessment - 10/11/21 1104     Subjective Pt reports she has Parkinson's and got a brain stimulator. Pt reports R LE weakness. Pt is  to get a shot in her back Nov 01, 2021. Pt reports feeling it all the way from her back to the side of her leg into her right foot. Back becomes most aggravated sometimes with standing, laying, or walking. Pt states she feels the worst in the morning when she's the most stiff.    Limitations Standing;Walking    How long can you sit comfortably? ~1 hr    How long can you stand comfortably? ~30 min    How long can you walk comfortably? If walker can go ~1 hr    Patient Stated Goals Improve R LE strength and improve pain    Currently in Pain? Yes    Pain Score 8     Pain Location Back    Pain  Orientation Mid;Right;Lower    Pain Descriptors / Indicators Aching;Tingling    Pain Type Acute pain    Pain Onset More than a month ago    Aggravating Factors  Different positions    Pain Relieving Factors Heat or ice                [facility] PT Assessment - 10/11/21 0001       Assessment   Medical Diagnosis M54.16 (ICD-10-CM) - Radiculopathy, lumbar region    Referring Provider (PT) Marvis Moeller, NP    Onset Date/Surgical Date --   >1 year   Prior Therapy for her back ~2019      Precautions   Precautions Fall    Precaution Comments Bilateral deep brain stimulator placement 06/2021      Balance Screen   Has the patient fallen in the past 6 months Yes    How many times? 2   Last fall last week   Has the patient had a decrease in activity level because of a fear of falling?  No    Is the patient reluctant to leave their home because of a fear of falling?  No      Home Ecologist residence    Living Arrangements Other (Comment);Children    Available Help at Discharge Family    Type of [place] to enter    Entrance Stairs-Number of Steps 1    Entrance Stairs-Rails Right    Home Layout Two level;Able to live on main level with bedroom/bathroom    [place] - 4 wheels;Cane - single point;Other (comment)    Additional Comments lives with daughter and two grandchildren      Prior Function   Level of [place] Retired    Leisure Enjoys shopping, travelling with church group      Observation/Other Assessments   Focus on Therapeutic Outcomes (FOTO)  38; predicted 46      Sensation   Light Touch Impaired by gross assessment   Decreased sensation R thigh/knee > L     Posture/Postural Control   Posture Comments decreased lordosis, fwd head, rounded shoulders      ROM / Strength   AROM / PROM / Strength Strength;AROM      AROM   AROM Assessment Site Lumbar   Performed in  seated   Lumbar Flexion 25%    Lumbar Extension 25%    Lumbar - Right Side Bend able to touch the floor in sitting    Lumbar - Left Side Bend ~2" from the floor    Lumbar - Right Rotation 75%    Lumbar -  Left Rotation 50%      Strength   Strength Assessment Site Hip;Knee    Right/Left Hip Right;Left    Right Hip Flexion 3-/5    Right Hip Extension 3/5    Right Hip ABduction 3+/5    Left Hip Flexion 4/5    Left Hip Extension 3/5    Left Hip ABduction 4+/5    Right/Left Knee Right;Left    Right Knee Flexion 3/5    Right Knee Extension 3/5    Left Knee Flexion 4+/5    Left Knee Extension 4+/5      Palpation   Palpation comment TTP R iliocostalis, QL and upper glute      Transfers   Sit to Stand 5: Supervision;With upper extremity assist;From chair/3-in-1    Five time sit to stand comments  28   had to use UEs, difficulty maintaining balance without holding on to chair for support     Ambulation/Gait   Ambulation Distance (Feet) 50 Feet    Assistive device None   forgot her cane   Gait Pattern Step-through pattern;Decreased stance time - right;Decreased stance time - left;Decreased step length - left;Decreased step length - right;Decreased dorsiflexion - right;Decreased dorsiflexion - left;Right foot flat;Left foot flat;Festinating;Shuffle;Narrow base of support;Poor foot clearance - left;Poor foot clearance - right;Lateral hip instability    Ambulation Surface Level;Indoor                        Objective measurements completed on examination: See above findings.       [place] Adult PT Treatment/Exercise - 10/11/21 0001       Exercises   Exercises Lumbar      Lumbar Exercises: Stretches   Lower Trunk Rotation 3 reps;20 seconds    Pelvic Tilt 10 reps    Other Lumbar Stretch Exercise "L" stretch at counter x30 sec    Other Lumbar Stretch Exercise Lateral shift into wall x10 each side      Lumbar Exercises: Supine   Clam 10 reps    Clam Limitations red  tband                       PT Short Term Goals - 10/11/21 1208       PT SHORT TERM GOAL #1   Title Pt will be independent with HEP for improved strength, balance, transfers, and gait.    Time 3    Period Weeks    Status New    Target Date 11/01/21      PT SHORT TERM GOAL #2   Title Pt will improve 5x sit<>stand to less than or equal to 20 sec to demonstrate improved functional strength and transfer efficiency.    Baseline 28 sec    Time 3    Period Weeks    Status New    Target Date 11/01/21      PT SHORT TERM GOAL #3   Title Pt will report at least 25% decrease in her radicular symptoms    Time 3    Period Weeks    Status New    Target Date 11/01/21               PT Long Term Goals - 10/11/21 1209       PT LONG TERM GOAL #1   Title Pt will be independent with progression of HEP for improved strength, balance, transfers, and gait.    Time 6    Period  Weeks    Status New    Target Date 11/22/21      PT LONG TERM GOAL #2   Title Pt will improve 5x sit<>stand to less than or equal to 15 sec to demonstrate improved functional strength and transfer efficiency.    Time 6    Period Weeks    Status New    Target Date 11/22/21      PT LONG TERM GOAL #3   Title Pt will report at least 50% decrease in her back pain and radiculopathy    Time 6    Period Weeks    Status New    Target Date 11/22/21      PT LONG TERM GOAL #4   Title Pt will have improved FOTO score to at least 46    Time 6    Period Weeks    Status New    Target Date 11/22/21      PT LONG TERM GOAL #5   Time --                    Plan - 10/11/21 1159     Clinical Impression Statement Mrs. [name] is a 75 y/o F presenting to OPPT due to back pain with R LE radiculopathy. PMH significant for Parkinson's disease with deep brain stimulator, pt also reports back surgery in 2020. On assessment, pt demos decreased strength (R worse than L), decreased coordination,  bradykinesia, decreased balance and postural abnormalities. Pt would benefit from PT to address these issues to improve her overall mobility and safety at home.    Personal Factors and Comorbidities Comorbidity 3+;Fitness;Time since onset of injury/illness/exacerbation    Comorbidities PMHx significant for fall 05/16/21 with rib fractures, bilat DBS placement 06/2021 Parkinsons disease w/ scheduled brain stimulator placement 06/2021, Hx of orthostatic hypotension, anxiety/depression, RUE/RLE reflex sympathetic dystrophy, osteoporosis, lupus, fibromyalgia, CHF and OA. back surgery 2020 Dec    Examination-Activity Limitations Locomotion Level;Transfers;Stairs;Stand;Dressing;Carry;Bend;Squat;Lift;Bed Mobility    Examination-Participation Restrictions Community Activity;Shop;Meal Prep    Stability/Clinical Decision Making Evolving/Moderate complexity    Clinical Decision Making Moderate    Rehab Potential Good    PT Frequency 2x / week    PT Duration 6 weeks    PT Treatment/Interventions ADLs/Self Care Home Management;Gait training;Stair training;Functional mobility training;Therapeutic activities;Therapeutic exercise;Balance training;Neuromuscular re-education;Patient/family education;Manual techniques;Passive range of motion;Moist Heat;Ultrasound;Iontophoresis 4mg /ml Dexamethasone;DME Instruction;Dry needling;Taping    PT Next Visit Plan Review HEP -- modify and add to it as needed. Continue to work on hip and core strengthening. Initiate hamstring, glute, and lumbar stretching as pt tolerates.    PT Home Exercise Plan Access Code: [email or web address]    Consulted and Agree with Plan of Care Patient             Patient will benefit from skilled therapeutic intervention in order to improve the following deficits and impairments:  Abnormal gait, Difficulty walking, Decreased balance, Postural dysfunction, Decreased strength, Decreased mobility, Decreased coordination, Increased fascial restricitons, Impaired  tone, Pain, Improper body mechanics, Impaired sensation, Decreased activity tolerance  Visit Diagnosis: Chronic right-sided low back pain with right-sided sciatica  Abnormal posture  Muscle weakness (generalized)  Other abnormalities of gait and mobility  Unsteadiness on feet     Problem List Patient Active Problem List   Diagnosis Date Noted   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   Dyskinesia 07/22/2021   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[place]) 05/28/2021   Closed fracture of rib of right side with routine healing 05/28/2021   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[place]) 02/08/2021   Cough 12/20/2020   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   Left shoulder pain 06/19/2020   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   Degenerative lumbar spinal stenosis 11/27/2019   Knee pain, bilateral 08/06/2018   Constipation 08/06/2018   Right lumbar radiculopathy 04/10/2018   Dysphagia 10/09/2017   Chronic cough 07/16/2017   GAD (generalized anxiety disorder) 04/28/2016   Chronic insomnia 03/07/2016   Left Achilles tendinitis 12/08/2015   Parkinson's disease ([place]) 08/25/2015   Advanced care planning/counseling discussion 05/26/2015   Family circumstance 02/23/2015   Health maintenance examination 05/21/2014   MDD (major depressive disorder), single episode, moderate ([place]) 11/12/2013   DDD (degenerative disc disease), lumbar 05/31/2013   Medicare annual wellness visit, subsequent 05/20/2013   HLD (hyperlipidemia) 05/20/2013   Vitamin D deficiency 05/09/2013   Recurrent UTI 01/03/2011   Rash and other nonspecific skin eruption 05/11/2010   GERD 02/22/2010   CAD (coronary artery disease) 01/25/2010   Osteoporosis 11/10/2009   Orthostatic hypotension 12/06/2008   Anemia 09/25/2007   Vitamin B12 deficiency 05/03/2007   Fibromyalgia 05/03/2007    Keiran Sias April Gordy Levan, PT, DPT 10/11/2021, 12:13 PM  [place]. [place], [place], [address] Phone: [phone]   Fax:  [phone]  Name: [name] MRN: [id number] Date of Birth: 1946/03/04

## 2021-10-13 ENCOUNTER — Other Ambulatory Visit: Payer: Self-pay

## 2021-10-13 ENCOUNTER — Encounter: Payer: Self-pay | Admitting: Physical Therapy

## 2021-10-13 ENCOUNTER — Ambulatory Visit: Payer: Medicare Other | Admitting: Physical Therapy

## 2021-10-13 DIAGNOSIS — R293 Abnormal posture: Secondary | ICD-10-CM

## 2021-10-13 DIAGNOSIS — R2689 Other abnormalities of gait and mobility: Secondary | ICD-10-CM | POA: Diagnosis not present

## 2021-10-13 DIAGNOSIS — M6281 Muscle weakness (generalized): Secondary | ICD-10-CM

## 2021-10-13 DIAGNOSIS — G8929 Other chronic pain: Secondary | ICD-10-CM | POA: Diagnosis not present

## 2021-10-13 DIAGNOSIS — M5441 Lumbago with sciatica, right side: Secondary | ICD-10-CM | POA: Diagnosis not present

## 2021-10-13 DIAGNOSIS — R2681 Unsteadiness on feet: Secondary | ICD-10-CM | POA: Diagnosis not present

## 2021-10-13 NOTE — Therapy (Signed)
[place]. [place], [place], [address] Phone: [phone]   Fax:  [phone]  Physical Therapy Treatment  Patient Details  Name: [name] MRN: [id number] Date of Birth: Apr 07, 1946 Referring Provider (PT): Marvis Moeller, NP   Encounter Date: 10/13/2021   PT End of Session - 10/13/21 1140     Visit Number 2    Number of Visits 12    Date for PT Re-Evaluation 11/22/21    PT Start Time 1100    PT Stop Time 1142    PT Time Calculation (min) 42 min    Activity Tolerance Patient tolerated treatment well    Behavior During Therapy [facility] for tasks assessed/performed             Past Medical History:  Diagnosis Date   Allergy    ANEMIA-NOS 09/25/2007   Anxiety    Arthritis    B12 DEFICIENCY 05/03/2007   Blood transfusion without reported diagnosis    CAD (coronary artery disease) 01/2010   MI, Nishan   Cardiomyopathy ([place]) 02/08/2010   H/o this 2012 after urosepsis, no recurrence.    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   OSTEOPOROSIS 08/2009   bisphosphonate on hold 2/2 dysphagia, on reclast done in August each year   Parkinson's disease ([place]) 08/25/2015   Dx Dr [name] 07/2015    PONV (postoperative nausea and vomiting)    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   Takotsubo cardiomyopathy 2008   due to E coli urosepsis    Past Surgical History:  Procedure  Laterality Date   ABDOMINAL HYSTERECTOMY  1970s   IUD infection - first partial then with oophorectomy (cysts), complication - low blood pressure   CATARACT EXTRACTION Bilateral    CHOLECYSTECTOMY     complication - low blood pressure   COLONOSCOPY  06/2008   h/o polyps but latest WNL, rec rpt 10 yrs Olevia Perches)   COLONOSCOPY  11/2018   multiple TAs (10 polyps total), rpt 2 yrs Fuller Plan)   CYSTOSCOPY  12/2013   abx treatment for recurrent cystitis   DEXA  04/2013   T -2.9 @ femur, -1.6 @ spine   DEXA  04/2017   T -2.9 hip, -0.7 spine   ESOPHAGOGASTRODUODENOSCOPY  12/2017   WNL, regardless esophagus dilated, small HH Fuller Plan)   EYE SURGERY     LUMBAR LAMINECTOMY/DECOMPRESSION MICRODISCECTOMY Right 11/27/2019   Right Lumbar Four-Five foraminotomy;  Erline Levine, MD)   MINOR PLACEMENT OF FIDUCIAL N/A 06/30/2021   Procedure: MINOR PLACEMENT OF FIDUCIAL;  Surgeon: Erline Levine, MD;  Location: [facility];  Service: Neurosurgery;  Laterality: N/A;  Minor room   PTOSIS REPAIR Bilateral 10/2020   Plastic Surgery   PULSE GENERATOR IMPLANT Right 07/14/2021   Procedure: UNILATERAL PULSE GENERATOR IMPLANT;  Surgeon: Erline Levine, MD;  Location: [facility];  Service: Neurosurgery;  Laterality: Right;  SUBTHALAMIC STIMULATOR INSERTION Bilateral 07/07/2021   Procedure: Deep brain stimulator placement;  Surgeon: Erline Levine, MD;  Location: [facility];  Service: Neurosurgery;  Laterality: Bilateral;   TUBAL LIGATION     UPPER GASTROINTESTINAL ENDOSCOPY      There were no vitals filed for this visit.   Subjective Assessment - 10/13/21 1059     Subjective having some back pain and is losing some strength I the R leg    Currently in Pain? Yes    Pain Score 8     Pain Location Back    Pain Orientation Right                               [facility] Adult PT Treatment/Exercise - 10/13/21 0001       Ambulation/Gait   Stairs Yes    Stairs Assistance 5: Supervision    Stair Management  Technique One rail Left;Step to pattern;With cane    Number of Stairs 10    Height of Stairs 6      Lumbar Exercises: Stretches   Other Lumbar Stretch Exercise Trunk flex stretch      Lumbar Exercises: Aerobic   Nustep L2 x 6 min      Lumbar Exercises: Seated   Long Arc Quad on Chair Strengthening;Both;2 sets;10 reps    LAQ on Chair Weights (lbs) 2    Sit to Stand 5 reps   x3 elevated surface   Other Seated Lumbar Exercises seated march 2lb 2x10, ball squeeze 2x10    Other Seated Lumbar Exercises HS curls yelow 2x10                       PT Short Term Goals - 10/11/21 1208       PT SHORT TERM GOAL #1   Title Pt will be independent with HEP for improved strength, balance, transfers, and gait.    Time 3    Period Weeks    Status New    Target Date 11/01/21      PT SHORT TERM GOAL #2   Title Pt will improve 5x sit<>stand to less than or equal to 20 sec to demonstrate improved functional strength and transfer efficiency.    Baseline 28 sec    Time 3    Period Weeks    Status New    Target Date 11/01/21      PT SHORT TERM GOAL #3   Title Pt will report at least 25% decrease in her radicular symptoms    Time 3    Period Weeks    Status New    Target Date 11/01/21               PT Long Term Goals - 10/11/21 1209       PT LONG TERM GOAL #1   Title Pt will be independent with progression of HEP for improved strength, balance, transfers, and gait.    Time 6    Period Weeks    Status New    Target Date 11/22/21      PT LONG TERM GOAL #2   Title Pt will improve 5x sit<>stand to less than or equal to 15 sec to demonstrate improved functional strength and transfer efficiency.    Time 6    Period Weeks    Status New    Target Date 11/22/21      PT LONG TERM GOAL #3  Title Pt will report at least 50% decrease in her back pain and radiculopathy    Time 6    Period Weeks    Status New    Target Date 11/22/21      PT LONG TERM GOAL #4   Title Pt  will have improved FOTO score to at least 46    Time 6    Period Weeks    Status New    Target Date 11/22/21      PT LONG TERM GOAL #5   Time --                   Plan - 10/13/21 1141     Clinical Impression Statement Pt tolerated an initial progression toe TW well. Increase time needed to complete interventions allowing pt to rest. Cue for full ROM needed with HS curls and LAQ. RLE weakness present with SL strengthening interventions. Cue needed to maintain sequencing with stair negotiation.    Personal Factors and Comorbidities Comorbidity 3+;Fitness;Time since onset of injury/illness/exacerbation    Comorbidities PMHx significant for fall 05/16/21 with rib fractures, bilat DBS placement 06/2021 Parkinsons disease w/ scheduled brain stimulator placement 06/2021, Hx of orthostatic hypotension, anxiety/depression, RUE/RLE reflex sympathetic dystrophy, osteoporosis, lupus, fibromyalgia, CHF and OA. back surgery 2020 Dec    Examination-Activity Limitations Locomotion Level;Transfers;Stairs;Stand;Dressing;Carry;Bend;Squat;Lift;Bed Mobility    Examination-Participation Restrictions Community Activity;Shop;Meal Prep    Stability/Clinical Decision Making Evolving/Moderate complexity    Rehab Potential Good    PT Frequency 2x / week    PT Duration 6 weeks    PT Treatment/Interventions ADLs/Self Care Home Management;Gait training;Stair training;Functional mobility training;Therapeutic activities;Therapeutic exercise;Balance training;Neuromuscular re-education;Patient/family education;Manual techniques;Passive range of motion;Moist Heat;Ultrasound;Iontophoresis 4mg /ml Dexamethasone;DME Instruction;Dry needling;Taping    PT Next Visit Plan modify and add to it as needed. Continue to work on hip and core strengthening. Initiate hamstring, glute, and lumbar stretching as pt tolerates.             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   Dyskinesia 07/22/2021   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[place]) 05/28/2021   Closed fracture of rib of right side with routine healing 05/28/2021   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[place]) 02/08/2021   Cough 12/20/2020   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   Left shoulder pain 06/19/2020   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   Degenerative lumbar spinal stenosis 11/27/2019   Knee pain, bilateral 08/06/2018   Constipation 08/06/2018   Right lumbar radiculopathy 04/10/2018   Dysphagia 10/09/2017   Chronic cough 07/16/2017   GAD (generalized anxiety disorder) 04/28/2016   Chronic insomnia 03/07/2016   Left Achilles tendinitis 12/08/2015   Parkinson's disease ([place]) 08/25/2015   Advanced care planning/counseling discussion 05/26/2015   Family circumstance 02/23/2015   Health maintenance examination 05/21/2014   MDD (major depressive disorder), single episode, moderate ([place]) 11/12/2013   DDD (degenerative disc disease), lumbar 05/31/2013   Medicare annual wellness visit, subsequent 05/20/2013   HLD (hyperlipidemia) 05/20/2013   Vitamin D deficiency 05/09/2013   Recurrent UTI  01/03/2011   Rash and other nonspecific skin eruption 05/11/2010   GERD 02/22/2010   CAD (coronary artery disease) 01/25/2010   Osteoporosis 11/10/2009   Orthostatic hypotension 12/06/2008   Anemia 09/25/2007   Vitamin B12 deficiency 05/03/2007    Fibromyalgia 05/03/2007    Scot Jun, PTA 10/13/2021, 11:43 AM  [place]. [place], [place], [address] Phone: [phone]   Fax:  [phone]  Name: YASAMIN KAREL MRN: [id number] Date of Birth: 31-Mar-1946

## 2021-10-18 ENCOUNTER — Ambulatory Visit: Payer: Medicare Other | Admitting: Physical Therapy

## 2021-10-19 ENCOUNTER — Encounter: Payer: Self-pay | Admitting: Family Medicine

## 2021-10-19 MED ORDER — CLONAZEPAM 0.5 MG PO TABS: ORAL_TABLET | ORAL | 0 refills | Status: DC

## 2021-10-19 NOTE — Telephone Encounter (Signed)
ERx 

## 2021-10-19 NOTE — Telephone Encounter (Signed)
Name of Medication: Clonazepam Name of Pharmacy: [facility] Last Fill or Written Date and Quantity: 09/10/21, #45 Last Office Visit and Type: 07/29/21, 3 mo f/u Next Office Visit and Type: 11/02/21, AWV prt 2 Last Controlled Substance Agreement Date: 04/28/16 Last UDS: 04/28/16

## 2021-10-23 NOTE — Progress Notes (Deleted)
Subjective:   [name] is a 75 y.o. female who presents for Medicare Annual (Subsequent) preventive examination.  I connected with [name] today by telephone and verified that I am speaking with the correct person using two identifiers. Location patient: home Location provider: work Persons participating in the virtual visit: patient, [name].    I discussed the limitations, risks, security and privacy concerns of performing an evaluation and management service by telephone and the availability of in person appointments. I also discussed with the patient that there may be a patient responsible charge related to this service. The patient expressed understanding and verbally consented to this telephonic visit.    Interactive audio and video telecommunications were attempted between this provider and patient, however failed, due to patient having technical difficulties OR patient did not have access to video capability.  We continued and completed visit with audio only.  Some vital signs may be absent or patient reported.   Time Spent with patient on telephone encounter: *** minutes  Review of Systems           Objective:    There were no vitals filed for this visit. There is no height or weight on file to calculate BMI.  Advanced Directives 10/11/2021 09/20/2021 09/20/2021 09/05/2021 08/08/2021 07/23/2021 07/14/2021  Does Patient Have a Medical Advance Directive? Yes Yes Yes Yes - Yes Yes  Type of Advance Directive Living will Living will Living will Living will Living will Healthcare Power of [place]  Does patient want to make changes to medical advance directive? - - - - - No - Patient declined No - Patient declined  Copy of [place] in Chart? - - - - - No - copy requested No - copy requested  Would patient like information on creating a medical advance directive? - - - - - - -    Current Medications (verified) Outpatient  Encounter Medications as of 10/27/2021  Medication Sig   acetaminophen (TYLENOL) 650 MG CR tablet Take 2 tablets (1,300 mg total) by mouth every 8 (eight) hours as needed for pain.   AMBULATORY NON FORMULARY MEDICATION Lift chair Dx:  G20   Calcium Carbonate-Vitamin D (CALCIUM 600/VITAMIN D) 600-400 MG-UNIT chew tablet Chew 1 tablet by mouth daily.   carbidopa-levodopa (SINEMET IR) 25-100 MG tablet TAKE 1 TABLETS BY MOUTH AT 6 AM ,0.5 TABLET AT 9:30 AM, 0.5 PM AND 4:30 PM. MAY TAKE AN EXTRA ONE-HALF TABLET BY MOUTH AS NEEDED IN THE EVENING   Cholecalciferol (VITAMIN D3) 25 MCG (1000 UT) CAPS Take 1 capsule (1,000 Units total) by mouth daily.   clonazePAM (KLONOPIN) 0.5 MG tablet Take 0.5 tablets (0.25 mg total) by mouth every morning AND 1 tablet (0.5 mg total) at bedtime.   clotrimazole-betamethasone (LOTRISONE) cream Apply 1 application topically daily.   cyanocobalamin (,VITAMIN B-12,) 1000 MCG/ML injection INJECT 1 ML (1,000 MCG TOTAL) INTO THE MUSCLE EVERY 30 (THIRTY) DAYS.   denosumab (PROLIA) 60 MG/ML SOSY injection Inject 60 mg into the skin every 6 (six) months.   fludrocortisone (FLORINEF) 0.1 MG tablet Take 1 tablet (0.1 mg total) by mouth daily.   Glycerin-Hypromellose-PEG 400 (ARTIFICIAL TEARS) 0.2-0.2-1 % SOLN Place 1 drop into both eyes daily as needed (dry eyes).   latanoprost (XALATAN) 0.005 % ophthalmic solution Place 1 drop into both eyes at bedtime.   linaclotide (LINZESS) 145 MCG CAPS capsule Take 1 capsule (145 mcg total) by mouth daily before breakfast. For constipation  sertraline (ZOLOFT) 100 MG tablet TAKE 1 TABLET BY MOUTH  DAILY (Patient taking differently: No sig reported)   traMADol (ULTRAM) 50 MG tablet Take 1 tablet (50 mg total) by mouth every 6 (six) hours as needed for moderate pain.   No facility-administered encounter medications on file as of 10/27/2021.    Allergies (verified) Amitriptyline, Ciprofloxacin, Cymbalta [duloxetine hcl], Iohexol, Lyrica  [pregabalin], Imipramine hcl, Iodine, Lidocaine hcl, Morphine sulfate, Neosporin [neomycin-bacitracin zn-polymyx], Sulfamethoxazole, and Tetracyclines & related   History: Past Medical History:  Diagnosis Date   Allergy    ANEMIA-NOS 09/25/2007   Anxiety    Arthritis    B12 DEFICIENCY 05/03/2007   Blood transfusion without reported diagnosis    CAD (coronary artery disease) 01/2010   MI, Nishan   Cardiomyopathy ([place]) 02/08/2010   H/o this 2012 after urosepsis, no recurrence.    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   OSTEOPOROSIS 08/2009   bisphosphonate on hold 2/2 dysphagia, on reclast done in August each year   Parkinson's disease ([place]) 08/25/2015   Dx Dr [name] 07/2015    PONV (postoperative nausea and vomiting)    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   Takotsubo cardiomyopathy 2008   due to E coli urosepsis   Past Surgical History:  Procedure Laterality Date   ABDOMINAL HYSTERECTOMY  1970s   IUD infection - first partial then with oophorectomy (cysts), complication - low blood pressure   CATARACT EXTRACTION Bilateral    CHOLECYSTECTOMY     complication - low blood pressure   COLONOSCOPY  06/2008   h/o polyps but latest WNL, rec rpt 10 yrs Olevia Perches)   COLONOSCOPY  11/2018   multiple TAs (10 polyps total), rpt 2 yrs [facility])   CYSTOSCOPY  12/2013   abx treatment for recurrent cystitis   DEXA  04/2013   T -2.9 @ femur, -1.6 @ spine   DEXA   04/2017   T -2.9 hip, -0.7 spine   ESOPHAGOGASTRODUODENOSCOPY  12/2017   WNL, regardless esophagus dilated, small [facility])   EYE SURGERY     LUMBAR LAMINECTOMY/DECOMPRESSION MICRODISCECTOMY Right 11/27/2019   Right Lumbar Four-Five foraminotomy;  Erline Levine, MD)   MINOR PLACEMENT OF FIDUCIAL N/A 06/30/2021   Procedure: MINOR PLACEMENT OF FIDUCIAL;  Surgeon: Erline Levine, MD;  Location: [facility];  Service: Neurosurgery;  Laterality: N/A;  Minor room   PTOSIS REPAIR Bilateral 10/2020   Plastic Surgery   PULSE GENERATOR IMPLANT Right 07/14/2021   Procedure: UNILATERAL PULSE GENERATOR IMPLANT;  Surgeon: Erline Levine, MD;  Location: [place];  Service: Neurosurgery;  Laterality: Right;   SUBTHALAMIC STIMULATOR INSERTION Bilateral 07/07/2021   Procedure: Deep brain stimulator placement;  Surgeon: Erline Levine, MD;  Location: [place];  Service: Neurosurgery;  Laterality: Bilateral;   TUBAL LIGATION     UPPER GASTROINTESTINAL ENDOSCOPY     Family History  Problem Relation Age of Onset  Heart attack Father    Diabetes Father    Prostate cancer Father    Esophageal cancer Mother    Lung cancer Brother    Breast cancer Sister    Ovarian cancer Sister    Lung cancer Brother    CAD Brother    Uterine cancer Sister    Clotting disorder Son    Healthy Son    Healthy Daughter    Healthy Daughter    Colon cancer Neg Hx    Rectal cancer Neg Hx    Stomach cancer Neg Hx    Social History   Socioeconomic History   Marital status: Widowed    Spouse name: Not on file   Number of children: 4   Years of education: Not on file   Highest education level: 10th grade  Occupational History   Occupation: retired  Tobacco Use   Smoking status: Never   Smokeless tobacco: Never  Vaping Use   Vaping Use: Never used  Substance and Sexual Activity   Alcohol use: No   Drug use: Not Currently    Types: Nitrous oxide   Sexual activity: Never  Other Topics Concern   Not on file  Social History  Narrative   Widow - husband [name]) passed away February 21, 2013.     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 N N  Vision? N N  Difficulty concentrating or making decisions? N N  Walking or climbing stairs? Y Y  Dressing or bathing? N N  Doing errands, shopping? N -  Some recent data might be hidden    Patient Care Team: [name], MD as PCP - General (Family Medicine) Domingo Pulse, MD (Urology) [name], Eustace Quail, DO as Consulting Physician (Neurology) Karren Burly Deirdre Peer, MD as Referring Physician (Ophthalmology) [name], Eustace Quail, DO as Consulting Physician (Neurology) Charlton Haws, [employer] as Pharmacist (Pharmacist)  Indicate any recent Medical Services you may have received from other than [facility] providers in the past year (date may be approximate).     Assessment:   This is a routine wellness examination for [place].  Hearing/Vision screen No results found.  Dietary issues and exercise activities discussed:     Goals Addressed   None    Depression Screen PHQ 2/9 Scores 10/27/2020 10/13/2019 10/04/2018 09/18/2017 11/14/2016 08/03/2016 05/26/2015  PHQ - 2 Score 0 0 0 2 0 0 0  PHQ- 9 Score 9 0 3 7 - - -    Fall Risk Fall Risk  09/05/2021 08/08/2021 04/18/2021 02/10/2021 12/22/2020  Falls in the  past year? 0 1 0 0 0  Comment - - - - -  Number falls in past yr: 0 0 0 0 0  Injury with Fall? 0 0 0 0 0  Risk for fall due to : - - - - -  Follow up - - - - -    FALL RISK PREVENTION PERTAINING TO THE HOME:  Any stairs in or around the home? {YES/NO:21197} If so, are there any without handrails? {YES/NO:21197} Home free of loose throw rugs in walkways, pet beds, electrical cords, etc? {YES/NO:21197} Adequate lighting in your home to reduce risk of falls? {YES/NO:21197}  ASSISTIVE DEVICES UTILIZED TO PREVENT FALLS:  Life alert? {YES/NO:21197} Use of a cane, walker or w/c? {YES/NO:21197} Grab bars in the bathroom? {YES/NO:21197} Shower chair or bench in shower? {YES/NO:21197} Elevated toilet seat or a handicapped toilet? {YES/NO:21197}  TIMED UP AND GO:  Was the test performed? No , visit completed over the phone.    Cognitive Function: MMSE - Mini Mental State Exam 01/25/2021 10/13/2019 10/04/2018 09/18/2017 08/03/2016  Orientation to time 5 5 5 5 5   Orientation to Place 5 5 5 5 5   Registration 3 3 3 3 3   Attention/ Calculation 3 4 0 0 0  Recall 3 3 3 3 3   Language- name 2 objects 2 - 0 0 0  Language- repeat 1 1 1 1 1   Language- follow 3 step command 2 - 3 3 3   Language- read & follow direction 1 - 0 0 0  Write a sentence 1 - 0 0 0  Copy design 0 - 0 0 0  Total score 26 - 20 20 20    Montreal Cognitive Assessment  02/11/2019  Visuospatial/ Executive (0/5) 3  Naming (0/3) 2  Attention: Read list of digits (0/2) 1  Attention: Read list of letters (0/1) 1  Attention: Serial 7 subtraction starting at 100 (0/3) 2  Language: Repeat phrase (0/2) 1  Language : Fluency (0/1) 0  Abstraction (0/2) 0  Delayed Recall (0/5) 5  Orientation (0/6) 6  Total 21  Adjusted Score (based on education) 22      Immunizations Immunization History  Administered Date(s) Administered   Fluad Quad(high Dose 65+) 08/12/2020   Influenza Split 08/18/2011, 10/11/2012   Influenza Whole  09/25/2007, 09/08/2008, 08/23/2009, 09/02/2010   Influenza, High Dose Seasonal PF 09/28/2019   Influenza,inj,Quad PF,6+ Mos 09/02/2013, 08/13/2014, 10/28/2015, 11/14/2016, 09/18/2017, 08/15/2018   Influenza,inj,quad, With Preservative 09/27/2018   PFIZER(Purple Top)SARS-COV-2 Vaccination 01/22/2020, 02/19/2020   Pneumococcal Conjugate-13 05/21/2014   Pneumococcal Polysaccharide-23 05/26/2015   Tdap 08/18/2011   Zoster, Live 10/12/2010    TDAP status: Due, Education has been provided regarding the importance of this vaccine. Advised may receive this vaccine at local pharmacy or Health Dept. Aware to provide a copy of the vaccination record if obtained from local pharmacy or Health Dept. Verbalized acceptance and understanding.  {Flu Vaccine status:2101806}  Pneumococcal vaccine status: Up to date  {Covid-19 vaccine status:2101808}  Qualifies for Shingles Vaccine? No   Zostavax completed Yes   {Shingrix Completed?:2101804}  Screening Tests Health Maintenance  Topic Date Due   Zoster Vaccines- Shingrix (1 of 2) Never done   COVID-19 Vaccine (3 - Booster for Pfizer series) 04/15/2020   INFLUENZA VACCINE  06/27/2021   TETANUS/TDAP  08/17/2021   MAMMOGRAM  12/06/2021   COLONOSCOPY (Pts 45-19yrs Insurance coverage will need to be confirmed)  05/12/2024   Pneumonia Vaccine 62+ Years old  Completed   DEXA SCAN  Completed   Hepatitis C Screening  Completed   HPV VACCINES  Aged Out    Health Maintenance  Health Maintenance Due  Topic Date Due  Zoster Vaccines- Shingrix (1 of 2) Never done   COVID-19 Vaccine (3 - Booster for Pfizer series) 04/15/2020   INFLUENZA VACCINE  06/27/2021   TETANUS/TDAP  08/17/2021    Colorectal cancer screening: Type of screening: Colonoscopy. Completed 05/12/21. Repeat every 3 years  Mammogram status: Completed 1/10/2. Repeat every year  {Bone Density status:21018021}  Lung Cancer Screening: (Low Dose CT Chest recommended if Age 68-80 years, 30  pack-year currently smoking OR have quit w/in 15years.) does not qualify.     Additional Screening:  Hepatitis C Screening: does qualify; Completed 04/06/16  Vision Screening: Recommended annual ophthalmology exams for early detection of glaucoma and other disorders of the eye. Is the patient up to date with their annual eye exam?  {YES/NO:21197} Who is the provider or what is the name of the office in which the patient attends annual eye exams? *** If pt is not established with a provider, would they like to be referred to a provider to establish care? {YES/NO:21197}.   Dental Screening: Recommended annual dental exams for proper oral hygiene  Community Resource Referral / Chronic Care Management: CRR required this visit?  {YES/NO:21197}  CCM required this visit?  {YES/NO:21197}     Plan:     I have personally reviewed and noted the following in the patient's chart:   Medical and social history Use of alcohol, tobacco or illicit drugs  Current medications and supplements including opioid prescriptions.  Functional ability and status Nutritional status Physical activity Advanced directives List of other physicians Hospitalizations, surgeries, and ER visits in previous 12 months Vitals Screenings to include cognitive, depression, and falls Referrals and appointments  In addition, I have reviewed and discussed with patient certain preventive protocols, quality metrics, and best practice recommendations. A written personalized care plan for preventive services as well as general preventive health recommendations were provided to patient.   Due to this being a telephonic visit, the after visit summary with patients personalized plan was offered to patient via mail or my-chart. ***Patient declined at this time./ Patient would like to access on my-chart/ per request, patient was mailed a copy of AVS./ Patient preferred to pick up at office at next visit.   Loma Messing,  LPN   [id number]   Nurse Health Advisor  Nurse Notes: none

## 2021-10-24 ENCOUNTER — Other Ambulatory Visit: Payer: Self-pay | Admitting: Family Medicine

## 2021-10-24 DIAGNOSIS — E559 Vitamin D deficiency, unspecified: Secondary | ICD-10-CM

## 2021-10-24 DIAGNOSIS — E538 Deficiency of other specified B group vitamins: Secondary | ICD-10-CM

## 2021-10-24 DIAGNOSIS — D649 Anemia, unspecified: Secondary | ICD-10-CM

## 2021-10-24 DIAGNOSIS — E785 Hyperlipidemia, unspecified: Secondary | ICD-10-CM

## 2021-10-26 ENCOUNTER — Other Ambulatory Visit: Payer: Self-pay

## 2021-10-26 ENCOUNTER — Ambulatory Visit: Payer: Medicare Other | Admitting: Physical Therapy

## 2021-10-26 ENCOUNTER — Other Ambulatory Visit: Payer: Medicare Other

## 2021-10-26 ENCOUNTER — Encounter: Payer: Self-pay | Admitting: Physical Therapy

## 2021-10-26 DIAGNOSIS — R293 Abnormal posture: Secondary | ICD-10-CM | POA: Diagnosis not present

## 2021-10-26 DIAGNOSIS — M6281 Muscle weakness (generalized): Secondary | ICD-10-CM | POA: Diagnosis not present

## 2021-10-26 DIAGNOSIS — R2689 Other abnormalities of gait and mobility: Secondary | ICD-10-CM | POA: Diagnosis not present

## 2021-10-26 DIAGNOSIS — G8929 Other chronic pain: Secondary | ICD-10-CM | POA: Diagnosis not present

## 2021-10-26 DIAGNOSIS — M5441 Lumbago with sciatica, right side: Secondary | ICD-10-CM | POA: Diagnosis not present

## 2021-10-26 DIAGNOSIS — R2681 Unsteadiness on feet: Secondary | ICD-10-CM

## 2021-10-26 NOTE — Patient Instructions (Signed)
Access Code: [facility] URL: [email or web address] Date: 10/26/2021 Prepared by: Ethel Rana  Exercises Seated Hamstring Stretch - 1 x daily - 7 x weekly - 3 reps - 20 hold Seated Piriformis Stretch - 1 x daily - 7 x weekly - 3 reps - 20 hold Mini Squats with Walker and Chair - 1 x daily - 7 x weekly - 2 sets - 10 reps

## 2021-10-26 NOTE — Therapy (Signed)
[place] [place], [place], [address] Phone: [phone]   Fax:  [phone]  Physical Therapy Treatment  Patient Details  Name: [name] MRN: [id number] Date of Birth: 1946-01-04 Referring Provider (PT): Marvis Moeller, NP   Encounter Date: 10/26/2021   PT End of Session - 10/26/21 1443     Visit Number 3    Number of Visits 12    Date for PT Re-Evaluation 11/22/21    PT Start Time 1102    PT Stop Time 1143    PT Time Calculation (min) 41 min    Activity Tolerance Patient tolerated treatment well    Behavior During Therapy [facility] for tasks assessed/performed             Past Medical History:  Diagnosis Date   Allergy    ANEMIA-NOS 09/25/2007   Anxiety    Arthritis    B12 DEFICIENCY 05/03/2007   Blood transfusion without reported diagnosis    CAD (coronary artery disease) 01/2010   MI, Nishan   Cardiomyopathy ([place]) 02/08/2010   H/o this 2012 after urosepsis, no recurrence.    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   OSTEOPOROSIS 08/2009   bisphosphonate on hold 2/2 dysphagia, on reclast done in August each year   Parkinson's disease ([place]) 08/25/2015   Dx Dr [name] 07/2015    PONV (postoperative nausea and vomiting)    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   Takotsubo cardiomyopathy 2008   due to E coli urosepsis    Past Surgical History:  Procedure  Laterality Date   ABDOMINAL HYSTERECTOMY  1970s   IUD infection - first partial then with oophorectomy (cysts), complication - low blood pressure   CATARACT EXTRACTION Bilateral    CHOLECYSTECTOMY     complication - low blood pressure   COLONOSCOPY  06/2008   h/o polyps but latest WNL, rec rpt 10 yrs Olevia Perches)   COLONOSCOPY  11/2018   multiple TAs (10 polyps total), rpt 2 yrs Fuller Plan)   CYSTOSCOPY  12/2013   abx treatment for recurrent cystitis   DEXA  04/2013   T -2.9 @ femur, -1.6 @ spine   DEXA  04/2017   T -2.9 hip, -0.7 spine   ESOPHAGOGASTRODUODENOSCOPY  12/2017   WNL, regardless esophagus dilated, small HH Fuller Plan)   EYE SURGERY     LUMBAR LAMINECTOMY/DECOMPRESSION MICRODISCECTOMY Right 11/27/2019   Right Lumbar Four-Five foraminotomy;  Erline Levine, MD)   MINOR PLACEMENT OF FIDUCIAL N/A 06/30/2021   Procedure: MINOR PLACEMENT OF FIDUCIAL;  Surgeon: Erline Levine, MD;  Location: [place];  Service: Neurosurgery;  Laterality: N/A;  Minor room   PTOSIS REPAIR Bilateral 10/2020   Plastic Surgery   PULSE GENERATOR IMPLANT Right 07/14/2021   Procedure: UNILATERAL PULSE GENERATOR IMPLANT;  Surgeon: Erline Levine, MD;  Location: [place];  Service: Neurosurgery;  Laterality: Right;  SUBTHALAMIC STIMULATOR INSERTION Bilateral 07/07/2021   Procedure: Deep brain stimulator placement;  Surgeon: Erline Levine, MD;  Location: [place];  Service: Neurosurgery;  Laterality: Bilateral;   TUBAL LIGATION     UPPER GASTROINTESTINAL ENDOSCOPY      There were no vitals filed for this visit.   Subjective Assessment - 10/26/21 1102     Subjective Pain is bad today with the weather.    Limitations Standing;Walking    How long can you sit comfortably? ~1 hr    How long can you stand comfortably? ~30 min    How long can you walk comfortably? If walker can go ~1 hr    Patient Stated Goals Improve R LE strength and improve pain    Currently in Pain? Yes    Pain Score 9     Pain Location Hip    Pain  Orientation Right;Proximal;Distal;Lower;Lateral    Pain Descriptors / Indicators Aching;Sharp    Pain Type Acute pain    Pain Onset More than a month ago    Pain Frequency Constant                               [facility] Adult PT Treatment/Exercise - 10/26/21 0001       Lumbar Exercises: Stretches   Passive Hamstring Stretch 2 reps;20 seconds;Right    Single Knee to Chest Stretch Right;3 reps;30 seconds    ITB Stretch Right;2 reps;20 seconds    Piriformis Stretch Right;2 reps;20 seconds      Lumbar Exercises: Standing   Other Standing Lumbar Exercises Mini squats x 10 reps      Lumbar Exercises: Supine   Bridge with Ball Squeeze Non-compliant;10 reps    Bridge with [employer] Limitations Also performed 10 ball squeezes    Bridge with clamshell Non-compliant;10 reps;1 second      Lumbar Exercises: Sidelying   Clam Right;10 reps    Clam Limitations gravity resisted      Manual Therapy   Manual Therapy Soft tissue mobilization;Passive ROM    Soft tissue mobilization Gentle STM to R gluts, piriformis, QL    Passive ROM Gentle PROM for hip ER, HS stretch, piriformis stretch.                     PT Education - 10/26/21 1442     Education provided Yes    Education Details Updated HEP emphasizing stretch and core/LE strength.    Person(s) Educated Patient    Methods Explanation;Demonstration;Handout    Comprehension Returned demonstration;Verbalized understanding              PT Short Term Goals - 10/11/21 1208       PT SHORT TERM GOAL #1   Title Pt will be independent with HEP for improved strength, balance, transfers, and gait.    Time 3    Period Weeks    Status New    Target Date 11/01/21      PT SHORT TERM GOAL #2   Title Pt will improve 5x sit<>stand to less than or equal to 20 sec to demonstrate improved functional strength and transfer efficiency.    Baseline 28 sec    Time 3    Period Weeks    Status New    Target Date  11/01/21      PT SHORT TERM GOAL #3   Title Pt will report at least 25% decrease in her radicular symptoms  Time 3    Period Weeks    Status New    Target Date 11/01/21               PT Long Term Goals - 10/26/21 1451       PT LONG TERM GOAL #1   Title Pt will be independent with progression of HEP for improved strength, balance, transfers, and gait.    Time 4    Period Weeks    Status On-going      PT LONG TERM GOAL #2   Title Pt will improve 5x sit<>stand to less than or equal to 15 sec to demonstrate improved functional strength and transfer efficiency.    Time 6    Period Weeks    Status New      PT LONG TERM GOAL #3   Title Pt will report at least 50% decrease in her back pain and radiculopathy    Time 4    Period Weeks    Status On-going      PT LONG TERM GOAL #4   Title Pt will have improved FOTO score to at least 46    Time 6    Period Weeks    Status New      PT LONG TERM GOAL #5   Time 6    Period Weeks    Status On-going                   Plan - 10/26/21 1444     Clinical Impression Statement Patient was very sore and limited in supine stretches due to back pain. She says she will be getting an injection in her back next Tuesday.  Attempted to modoify positions to accomodate severe pain, with some progress in her tolerance. Updated HEP. Patient may benefit from traction due to multi level degeneration and mod-severe canal stenosis and foraminal naroowing from L3-S1.    Personal Factors and Comorbidities Comorbidity 3+;Fitness;Time since onset of injury/illness/exacerbation    Comorbidities PMHx significant for fall 05/16/21 with rib fractures, bilat DBS placement 06/2021 Parkinsons disease w/ scheduled brain stimulator placement 06/2021, Hx of orthostatic hypotension, anxiety/depression, RUE/RLE reflex sympathetic dystrophy, osteoporosis, lupus, fibromyalgia, CHF and OA. back surgery 2020 Dec    Examination-Activity Limitations Locomotion  Level;Transfers;Stairs;Stand;Dressing;Carry;Bend;Squat;Lift;Bed Mobility    Examination-Participation Restrictions Community Activity;Shop;Meal Prep    Stability/Clinical Decision Making Evolving/Moderate complexity    Clinical Decision Making Moderate    Rehab Potential Good    PT Frequency 2x / week    PT Duration 6 weeks    PT Treatment/Interventions ADLs/Self Care Home Management;Gait training;Stair training;Functional mobility training;Therapeutic activities;Therapeutic exercise;Balance training;Neuromuscular re-education;Patient/family education;Manual techniques;Passive range of motion;Moist Heat;Ultrasound;Iontophoresis 4mg /ml Dexamethasone;DME Instruction;Dry needling;Taping    PT Next Visit Plan Continue to update HEP, try lumbar manual traction    Consulted and Agree with Plan of Care Patient             Patient will benefit from skilled therapeutic intervention in order to improve the following deficits and impairments:  Abnormal gait, Difficulty walking, Decreased balance, Postural dysfunction, Decreased strength, Decreased mobility, Decreased coordination, Increased fascial restricitons, Impaired tone, Pain, Improper body mechanics, Impaired sensation, Decreased activity tolerance  Visit Diagnosis: Chronic right-sided low back pain with right-sided sciatica  Abnormal posture  Muscle weakness (generalized)  Unsteadiness on feet     Problem List Patient Active Problem List   Diagnosis Date Noted   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   Dyskinesia 07/22/2021   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[place]) 05/28/2021  Closed fracture of rib of right side with routine healing 05/28/2021   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[place]) 02/08/2021   Cough 12/20/2020   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   Left shoulder pain 06/19/2020   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   Degenerative lumbar spinal stenosis 11/27/2019   Knee pain, bilateral  08/06/2018   Constipation 08/06/2018   Right lumbar radiculopathy 04/10/2018   Dysphagia 10/09/2017   Chronic cough 07/16/2017   GAD (generalized anxiety disorder) 04/28/2016   Chronic insomnia 03/07/2016   Left Achilles tendinitis 12/08/2015   Parkinson's disease ([place]) 08/25/2015   Advanced care planning/counseling discussion 05/26/2015   Family circumstance 02/23/2015   Health maintenance examination 05/21/2014   MDD (major depressive disorder), single episode, moderate ([place]) 11/12/2013   DDD (degenerative disc disease), lumbar 05/31/2013   Medicare annual wellness visit, subsequent 05/20/2013   HLD (hyperlipidemia) 05/20/2013   Vitamin D deficiency 05/09/2013   Recurrent UTI 01/03/2011   Rash and other nonspecific skin eruption 05/11/2010   GERD 02/22/2010   CAD (coronary artery disease) 01/25/2010   Osteoporosis 11/10/2009   Orthostatic hypotension 12/06/2008   Anemia 09/25/2007   Vitamin B12 deficiency 05/03/2007   Fibromyalgia 05/03/2007    Marcelina Morel, DPT 10/26/2021, 2:53 PM  [address]. [place], [place], [address] Phone: [phone]   Fax:  [phone]  Name: [name] MRN: [id number] Date of Birth: [birth year]

## 2021-10-27 ENCOUNTER — Ambulatory Visit: Payer: Medicare Other

## 2021-10-27 ENCOUNTER — Telehealth: Payer: Self-pay

## 2021-10-27 NOTE — Progress Notes (Signed)
Assessment/Plan:    1.  Parkinsons Disease             -Patient completed neurocognitive testing on January 25, 2021 with Dr. Nicole Kindred.  Evidence of MCI only.  -Patient is status post bilateral STN DBS on July 07, 2021 with IPG placement on July 14, 2021.    -Patient just takes levodopa on an as-needed basis, but estimates that [name] takes about 1 levodopa per day.  [name] can continue that.  [name] has not had any dyskinesia  -Patient will restart physical therapy once her ribs are less sore from the recent fall.  [name] plans to start potentially at the new year. 2.  Dyskinesia             -Resolved 3.  Neurogenic Orthostatic Hypotension             -Did well on Northera.  Patient stopped that on her own.  Now on Florinef 0.1 mg daily 4.  Depression             -On sertraline             -Declines counseling 5.  b12 deficiency             -on injections. 6.  Urinary incontinence             -on myrbetriq.  [name] is doing well in that regard 7.  Lumbar radiculopathy             -[name] is status post lumbar foraminotomy with Dr. Vertell Limber on November 27, 2019 by pain returned.  [name] has been following with the nurse practitioner at Dr. Melven Sartorius office.  [name] has had injections and we will see how [name] does with those before they contemplate surgery. 8.  Fatigue  -This is really fairly common post DBS surgery.  In addition, I wonder if [name] experienced this because we had to get her off of her medication so quickly because of significant dyskinesia following DBS (even before turning on the device).  [name] is doing better in this regard.  -Her daytime clonazepam has been decreased, but [name] is now off of that.  Going to have her stop her morning clonazepam and discontinue the bedtime for now.  -Patient on sertraline, 100 mg daily.   Subjective:   [name] was seen today in follow up for levodopa challenge.  My previous records were reviewed prior to todays visit as well as outside records available to me.   Patient has been doing fairly well since last visit.  No falls.  Following with the nurse practitioner at Dr. Melven Sartorius office for her back.  [name] has been going to physical therapy for this as well.  Last visit, we talked about potentially decreasing her daytime clonazepam.  [name] had was having a lot of fatigue.  I talked to her PCP about this as well and he was agreeable.  [name] has not taking half a tablet of clonazepam in the morning and 1 full tablet at bedtime.  Unfortunately, just this past Friday [name] was in the emergency room after a fall.  [name] was in her garage when [name] slipped on water and landed on her chest.  X-ray did not reveal any rib fractures, but ER physician thought maybe [name] clinically had some nondisplaced rib fractures, as [name] was very tender.  Current prescribed movement disorder medications: Levodopa as needed, but not regular   PREVIOUS MEDICATIONS: northera (worked well but pt d/c when  they quit sending b/c [name] owed $); amantadine (discontinued because of hallucinations); pramipexole (decreased in past because of hallucinations/dyskinesia)  ALLERGIES:   Allergies  Allergen Reactions   Amitriptyline Other (See Comments)    Sedated next morning   Ciprofloxacin Nausea And Vomiting   Cymbalta [Duloxetine Hcl] Other (See Comments)    Worsened depression   Iohexol Other (See Comments)     Code: HIVES, Desc: PT developed 2 hives, followed by SOB, severe headache post 87cc's Omnipaque 300., Onset Date: [id number]    Lyrica [Pregabalin] Other (See Comments)    Tried during hospitalization - unsure effects but unable to tolerate   Imipramine Hcl Rash   Iodine Rash   Lidocaine Hcl Rash   Morphine Sulfate Rash   Neosporin [Neomycin-Bacitracin Zn-Polymyx] Rash and Other (See Comments)    Worsened skin breaking out   Sulfamethoxazole Rash   Tetracyclines & Related Rash    CURRENT MEDICATIONS:  Outpatient Encounter Medications as of 10/31/2021  Medication Sig   acetaminophen  (TYLENOL) 650 MG CR tablet Take 2 tablets (1,300 mg total) by mouth every 8 (eight) hours as needed for pain.   AMBULATORY NON FORMULARY MEDICATION Lift chair Dx:  G20   Calcium Carbonate-Vitamin D (CALCIUM 600/VITAMIN D) 600-400 MG-UNIT chew tablet Chew 1 tablet by mouth daily.   carbidopa-levodopa (SINEMET IR) 25-100 MG tablet TAKE 1 TABLETS BY MOUTH AT 6 AM ,0.5 TABLET AT 9:30 AM, 0.5 PM AND 4:30 PM. MAY TAKE AN EXTRA ONE-HALF TABLET BY MOUTH AS NEEDED IN THE EVENING (Patient taking differently: Take 0.5 tablets by mouth in the morning and at bedtime. 0.5  TABLET BID BY MOUTH AS NEEDED IN THE EVENING)   Cholecalciferol (VITAMIN D3) 25 MCG (1000 UT) CAPS Take 1 capsule (1,000 Units total) by mouth daily.   clonazePAM (KLONOPIN) 0.5 MG tablet Take 0.5 tablets (0.25 mg total) by mouth every morning AND 1 tablet (0.5 mg total) at bedtime.   clotrimazole-betamethasone (LOTRISONE) cream Apply 1 application topically daily.   cyanocobalamin (,VITAMIN B-12,) 1000 MCG/ML injection INJECT 1 ML (1,000 MCG TOTAL) INTO THE MUSCLE EVERY 30 (THIRTY) DAYS.   denosumab (PROLIA) 60 MG/ML SOSY injection Inject 60 mg into the skin every 6 (six) months.   fludrocortisone (FLORINEF) 0.1 MG tablet Take 1 tablet (0.1 mg total) by mouth daily.   Glycerin-Hypromellose-PEG 400 (ARTIFICIAL TEARS) 0.2-0.2-1 % SOLN Place 1 drop into both eyes daily as needed (dry eyes).   latanoprost (XALATAN) 0.005 % ophthalmic solution Place 1 drop into both eyes at bedtime.   linaclotide (LINZESS) 145 MCG CAPS capsule Take 1 capsule (145 mcg total) by mouth daily before breakfast. For constipation   sertraline (ZOLOFT) 100 MG tablet TAKE 1 TABLET BY MOUTH  DAILY (Patient taking differently: Take by mouth every morning.)   traMADol (ULTRAM) 50 MG tablet Take 1 tablet (50 mg total) by mouth every 6 (six) hours as needed for moderate pain.   No facility-administered encounter medications on file as of 10/31/2021.    Objective:   PHYSICAL  EXAMINATION:    VITALS:   Vitals:   10/31/21 1400  BP: (!) 116/59  Pulse: 74  SpO2: 97%  Weight: 156 lb 9.6 oz (71 kg)       GEN:  The patient appears stated age and is in NAD. HEENT:  Normocephalic, atraumatic.  The mucous membranes are moist. The superficial temporal arteries are without ropiness or tenderness. CV:  RRR Lungs:  CTAB Neck/HEME:  There are no carotid bruits bilaterally.  Neurological  examination:  Orientation: The patient is alert and oriented x3. Cranial nerves: There is good facial symmetry. The speech is fluent and hypophonic and occasionally dysarthric. Soft palate rises symmetrically and there is no tongue deviation. Hearing is intact to conversational tone. Sensation: Sensation is intact to light touch throughout Motor: Strength is 5/5 in the bilateral upper and lower extremities.   Shoulder shrug is equal and symmetric.  There is no pronator drift.  Movement examination: Tone: There is normal tone in the upper and lower extremities before and after programming Abnormal movements: no rest tremor until [name] walks and then there is LUE rest tremor and rare rue rest tremor. Coordination:  There is normal rapid alternating movements when [name] left today. Gait and Station: [name] drags the right leg with ambulation, both pre and post programming.  [name] thinks that is because of the lumbar radiculopathy.  [name] is short stepped.    Chemistry      Component Value Date/Time   NA 142 07/22/2021 0431   K 3.4 (L) 07/22/2021 0431   CL 108 07/22/2021 0431   CO2 24 07/22/2021 0431   BUN 21 07/22/2021 0431   CREATININE 0.68 07/22/2021 0431   CREATININE 0.87 10/08/2019 0921      Component Value Date/Time   CALCIUM 9.4 07/22/2021 0431   ALKPHOS 70 07/22/2021 0431   AST 31 07/22/2021 0431   ALT 6 07/22/2021 0431   BILITOT 0.6 07/22/2021 0431       Lab Results  Component Value Date   WBC 12.3 (H) 07/22/2021   HGB 11.9 (L) 07/22/2021   HCT 38.9 07/22/2021   MCV  90.0 07/22/2021   PLT 399 07/22/2021    Lab Results  Component Value Date   TSH 0.49 10/04/2018    Total time spent on today's visit was 20 minutes, including both face-to-face time and nonface-to-face time.  Time included that spent on review of records (prior notes available to me/labs/imaging if pertinent), discussing treatment and goals, answering patient's questions and coordinating care.  This did not include DBS time.  Much of the visit was spent in DBS care, which is documented separate, and coded separate.   Cc:  [name], MD

## 2021-10-27 NOTE — Telephone Encounter (Signed)
Attempted several times to reach patient in regards to annual wellness visit this morning @ 10:30am. Was unable to leave pt a voicemail message on [phone]. Patient can reschedule appointment when available. TM

## 2021-10-27 NOTE — Addendum Note (Signed)
Addended by: Colbert Ewing MARIE L on: 10/27/2021 08:21 AM   Modules accepted: Orders

## 2021-10-28 ENCOUNTER — Ambulatory Visit (INDEPENDENT_AMBULATORY_CARE_PROVIDER_SITE_OTHER): Payer: Medicare Other

## 2021-10-28 ENCOUNTER — Emergency Department (HOSPITAL_COMMUNITY): Payer: Medicare Other

## 2021-10-28 ENCOUNTER — Encounter: Payer: Self-pay | Admitting: Family Medicine

## 2021-10-28 ENCOUNTER — Emergency Department (HOSPITAL_COMMUNITY)
Admission: EM | Admit: 2021-10-28 | Discharge: 2021-10-28 | Disposition: A | Discharge status: Home / Self Care | Payer: Medicare Other | Attending: Emergency Medicine | Admitting: Emergency Medicine

## 2021-10-28 ENCOUNTER — Telehealth: Payer: Self-pay

## 2021-10-28 VITALS — Ht 66.0 in | Wt 155.0 lb

## 2021-10-28 DIAGNOSIS — Z0000 Encounter for general adult medical examination without abnormal findings: Secondary | ICD-10-CM | POA: Diagnosis not present

## 2021-10-28 DIAGNOSIS — Z79899 Other long term (current) drug therapy: Secondary | ICD-10-CM | POA: Diagnosis not present

## 2021-10-28 DIAGNOSIS — R0781 Pleurodynia: Secondary | ICD-10-CM | POA: Diagnosis not present

## 2021-10-28 DIAGNOSIS — I2510 Atherosclerotic heart disease of native coronary artery without angina pectoris: Secondary | ICD-10-CM | POA: Diagnosis not present

## 2021-10-28 DIAGNOSIS — I509 Heart failure, unspecified: Secondary | ICD-10-CM | POA: Insufficient documentation

## 2021-10-28 DIAGNOSIS — Z780 Asymptomatic menopausal state: Secondary | ICD-10-CM

## 2021-10-28 DIAGNOSIS — W19XXXA Unspecified fall, initial encounter: Secondary | ICD-10-CM

## 2021-10-28 DIAGNOSIS — G20 Parkinson's disease: Secondary | ICD-10-CM | POA: Diagnosis not present

## 2021-10-28 DIAGNOSIS — M542 Cervicalgia: Secondary | ICD-10-CM | POA: Diagnosis not present

## 2021-10-28 DIAGNOSIS — R079 Chest pain, unspecified: Secondary | ICD-10-CM | POA: Diagnosis not present

## 2021-10-28 DIAGNOSIS — W010XXA Fall on same level from slipping, tripping and stumbling without subsequent striking against object, initial encounter: Secondary | ICD-10-CM | POA: Diagnosis not present

## 2021-10-28 DIAGNOSIS — Z8719 Personal history of other diseases of the digestive system: Secondary | ICD-10-CM | POA: Diagnosis not present

## 2021-10-28 DIAGNOSIS — M549 Dorsalgia, unspecified: Secondary | ICD-10-CM | POA: Diagnosis not present

## 2021-10-28 MED ORDER — ACETAMINOPHEN 325 MG PO TABS
650.0000 mg | ORAL_TABLET | Freq: Once | ORAL | Status: AC
  Administered 2021-10-28: 650 mg via ORAL
  Filled 2021-10-28: qty 2

## 2021-10-28 NOTE — Telephone Encounter (Signed)
Unable to reach pt by phone but per access nurse note pt agreed to go to [facility] for eval. Sending note to Dr Darnell Level and Lattie Haw CMA.

## 2021-10-28 NOTE — Progress Notes (Signed)
Subjective:   [name] is a 75 y.o. female who presents for Medicare Annual (Subsequent) preventive examination.  I connected with [name] today by telephone and verified that I am speaking with the correct person using two identifiers. Location patient: home Location provider: work Persons participating in the virtual visit: patient, [name].    I discussed the limitations, risks, security and privacy concerns of performing an evaluation and management service by telephone and the availability of in person appointments. I also discussed with the patient that there may be a patient responsible charge related to this service. The patient expressed understanding and verbally consented to this telephonic visit.    Interactive audio and video telecommunications were attempted between this provider and patient, however failed, due to patient having technical difficulties OR patient did not have access to video capability.  We continued and completed visit with audio only.  Some vital signs may be absent or patient reported.   Time Spent with patient on telephone encounter: 25 minutes  Review of Systems     Cardiac Risk Factors include: advanced age (>72men, >12 women)     Objective:    Today's Vitals   10/28/21 1318  Weight: 155 lb (70.3 kg)  Height: 5\' 6"  (1.676 m)  PainSc: 10-Worst pain ever   Body mass index is 25.02 kg/m.  Advanced Directives 10/28/2021 10/11/2021 09/20/2021 09/20/2021 09/05/2021 08/08/2021 07/23/2021  Does Patient Have a Medical Advance Directive? Yes Yes Yes Yes Yes - Yes  Type of Advance Directive [place];Living will Living will Living will Living will Living will Living will [name]  Does patient want to make changes to medical advance directive? Yes (MAU/Ambulatory/Procedural Areas - Information given) - - - - - No - Patient declined  Copy of [name] in Chart? Yes - validated most recent  copy scanned in chart (See row information) - - - - - No - copy requested  Would patient like information on creating a medical advance directive? - - - - - - -    Current Medications (verified) Outpatient Encounter Medications as of 10/28/2021  Medication Sig   acetaminophen (TYLENOL) 650 MG CR tablet Take 2 tablets (1,300 mg total) by mouth every 8 (eight) hours as needed for pain.   AMBULATORY NON FORMULARY MEDICATION Lift chair Dx:  G20   Calcium Carbonate-Vitamin D (CALCIUM 600/VITAMIN D) 600-400 MG-UNIT chew tablet Chew 1 tablet by mouth daily.   carbidopa-levodopa (SINEMET IR) 25-100 MG tablet TAKE 1 TABLETS BY MOUTH AT 6 AM ,0.5 TABLET AT 9:30 AM, 0.5 PM AND 4:30 PM. MAY TAKE AN EXTRA ONE-HALF TABLET BY MOUTH AS NEEDED IN THE EVENING (Patient taking differently: Take 0.5 tablets by mouth in the morning and at bedtime. 0.5  TABLET BID BY MOUTH AS NEEDED IN THE EVENING)   Cholecalciferol (VITAMIN D3) 25 MCG (1000 UT) CAPS Take 1 capsule (1,000 Units total) by mouth daily.   clonazePAM (KLONOPIN) 0.5 MG tablet Take 0.5 tablets (0.25 mg total) by mouth every morning AND 1 tablet (0.5 mg total) at bedtime.   clotrimazole-betamethasone (LOTRISONE) cream Apply 1 application topically daily.   cyanocobalamin (,VITAMIN B-12,) 1000 MCG/ML injection INJECT 1 ML (1,000 MCG TOTAL) INTO THE MUSCLE EVERY 30 (THIRTY) DAYS.   denosumab (PROLIA) 60 MG/ML SOSY injection Inject 60 mg into the skin every 6 (six) months.   fludrocortisone (FLORINEF) 0.1 MG tablet Take 1 tablet (0.1 mg total) by mouth daily.   Glycerin-Hypromellose-PEG 400 (  ARTIFICIAL TEARS) 0.2-0.2-1 % SOLN Place 1 drop into both eyes daily as needed (dry eyes).   latanoprost (XALATAN) 0.005 % ophthalmic solution Place 1 drop into both eyes at bedtime.   linaclotide (LINZESS) 145 MCG CAPS capsule Take 1 capsule (145 mcg total) by mouth daily before breakfast. For constipation   sertraline (ZOLOFT) 100 MG tablet TAKE 1 TABLET BY MOUTH  DAILY  (Patient taking differently: Take by mouth every morning.)   traMADol (ULTRAM) 50 MG tablet Take 1 tablet (50 mg total) by mouth every 6 (six) hours as needed for moderate pain.   No facility-administered encounter medications on file as of 10/28/2021.    Allergies (verified) Amitriptyline, Ciprofloxacin, Cymbalta [duloxetine hcl], Iohexol, Lyrica [pregabalin], Imipramine hcl, Iodine, Lidocaine hcl, Morphine sulfate, Neosporin [neomycin-bacitracin zn-polymyx], Sulfamethoxazole, and Tetracyclines & related   History: Past Medical History:  Diagnosis Date   Allergy    ANEMIA-NOS 09/25/2007   Anxiety    Arthritis    B12 DEFICIENCY 05/03/2007   Blood transfusion without reported diagnosis    CAD (coronary artery disease) 01/2010   MI, Nishan   Cardiomyopathy ([place]) 02/08/2010   H/o this 2012 after urosepsis, no recurrence.    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   OSTEOPOROSIS 08/2009   bisphosphonate on hold 2/2 dysphagia, on reclast done in August each year   Parkinson's disease ([place]) 08/25/2015   Dx Dr [name] 07/2015    PONV (postoperative nausea and vomiting)    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   Takotsubo cardiomyopathy 2008   due to E coli urosepsis   Past Surgical History:  Procedure Laterality Date   ABDOMINAL HYSTERECTOMY  1970s   IUD infection - first partial then with oophorectomy (cysts), complication - low blood pressure   CATARACT  EXTRACTION Bilateral    CHOLECYSTECTOMY     complication - low blood pressure   COLONOSCOPY  06/2008   h/o polyps but latest WNL, rec rpt 10 yrs [name])   COLONOSCOPY  11/2018   multiple TAs (10 polyps total), rpt 2 yrs [facility])   CYSTOSCOPY  12/2013   abx treatment for recurrent cystitis   DEXA  04/2013   T -2.9 @ femur, -1.6 @ spine   DEXA  04/2017   T -2.9 hip, -0.7 spine   ESOPHAGOGASTRODUODENOSCOPY  12/2017   WNL, regardless esophagus dilated, small HH [facility])   EYE SURGERY     LUMBAR LAMINECTOMY/DECOMPRESSION MICRODISCECTOMY Right 11/27/2019   Right Lumbar Four-Five foraminotomy;  Erline Levine, MD)   MINOR PLACEMENT OF FIDUCIAL N/A 06/30/2021   Procedure: MINOR PLACEMENT OF FIDUCIAL;  Surgeon: Erline Levine, MD;  Location: [facility];  Service: Neurosurgery;  Laterality: N/A;  Minor room   PTOSIS REPAIR Bilateral 10/2020   Plastic Surgery   PULSE GENERATOR IMPLANT Right 07/14/2021   Procedure: UNILATERAL PULSE GENERATOR IMPLANT;  Surgeon: Erline Levine, MD;  Location: [facility];  Service: Neurosurgery;  Laterality: Right;   SUBTHALAMIC STIMULATOR INSERTION Bilateral 07/07/2021   Procedure: Deep brain stimulator placement;  Surgeon: Erline Levine, MD;  Location: [facility];  Service: Neurosurgery;  Laterality: Bilateral;   TUBAL LIGATION     UPPER GASTROINTESTINAL ENDOSCOPY     Family History  Problem Relation Age of Onset   Heart attack Father    Diabetes Father    Prostate cancer Father    Esophageal cancer Mother    Lung cancer Brother    Breast cancer Sister    Ovarian cancer Sister    Lung cancer Brother    CAD Brother    Uterine cancer Sister    Clotting disorder Son    Healthy Son    Healthy Daughter    Healthy Daughter    Colon cancer Neg Hx    Rectal cancer Neg Hx    Stomach cancer Neg Hx    Social History   Socioeconomic History   Marital status: Widowed    Spouse name: Not on file   Number of children: 4   Years of education: Not on file   Highest education  level: 10th grade  Occupational History   Occupation: retired  Tobacco Use   Smoking status: Never   Smokeless tobacco: Never  Vaping Use   Vaping Use: Never used  Substance and Sexual Activity   Alcohol use: No   Drug use: Not Currently    Types: Nitrous oxide   Sexual activity: Never  Other Topics Concern   Not on file  Social History Narrative   Widow - husband [name]) passed away 02/28/13.     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   Activity: limited by back pain   Right Handed   Social Determinants of Health   Financial Resource Strain: Low Risk    Difficulty of Paying Living Expenses: Not hard at all  Food Insecurity: No Food Insecurity   Worried About Charity fundraiser in the Last Year: Never true   Arboriculturist in the Last Year: Never true  Transportation Needs: No Transportation Needs   Lack of Transportation (Medical): No   Lack of Transportation (Non-Medical): No  Physical Activity: Inactive   Days of Exercise per Week: 0 days   Minutes of Exercise per Session: 0 min  Stress: No Stress Concern Present   Feeling of Stress : Not at all  Social Connections: Moderately Integrated   Frequency of Communication with Friends and Family: More than three times a week   Frequency of Social Gatherings with Friends and Family: Twice a week   Attends Religious Services: More than 4 times per year   Active Member of [employer] or Organizations: Yes   Attends Archivist Meetings: More than 4 times per year   Marital Status: Widowed    Tobacco Counseling Counseling given: Not Answered   Clinical Intake:  Pre-visit preparation completed: Yes  Pain : 0-10 Pain Score: 10-Worst pain ever Pain Type: Acute pain Pain Location: Breast (chest area)     BMI - recorded: 24.38 Nutritional Status: BMI of 19-24  Normal Nutritional Risks:  None Diabetes: No  How often do you need to have someone help you when you read instructions, pamphlets, or other written materials from your doctor or pharmacy?: 1 - Never  Diabetic?No  Interpreter Needed?: No  Information entered by ::  Orrin Brigham LPN   Activities of Daily Living In your present state of health, do you have any difficulty performing the following activities: 10/28/2021 07/23/2021  Hearing? N N  Vision? N N  Difficulty concentrating or making decisions? N N  Walking or climbing stairs? N Y  Dressing or bathing? N N  Doing errands, shopping? N N  Preparing Food and eating ? N -  Using the Toilet? N -  In the past six months, have you accidently leaked urine? Y -  Comment leakage -  Do you have problems with loss of bowel control? N -  Managing your Medications? N -  Managing your Finances? N -  Housekeeping or managing your Housekeeping? N -  Some recent data might be hidden    Patient Care Team: [name], MD as PCP - General (Family Medicine) Domingo Pulse, MD (Urology) [name], Eustace Quail, DO as Consulting Physician (Neurology) Karren Burly Deirdre Peer, MD as Referring Physician (Ophthalmology) [name], Eustace Quail, DO as Consulting Physician (Neurology) Charlton Haws, [facility] as Pharmacist (Pharmacist)  Indicate any recent Medical Services you may have received from other than [facility] providers in the past year (date may be approximate).     Assessment:   This is a routine wellness examination for [name].  Hearing/Vision screen Hearing Screening - Comments:: No issues Vision Screening - Comments:: Last exam 01/2021, Dr. Amparo Bristol, Glaucoma both eyes  Dietary issues and exercise activities discussed: Current Exercise Habits: The patient does not participate in regular exercise at present   Goals Addressed             This Visit's Progress    Patient Stated       Would like to get back to walking       Depression Screen PHQ 2/9  Scores 10/28/2021 10/27/2020 10/13/2019 10/04/2018 09/18/2017 11/14/2016 08/03/2016  PHQ - 2 Score 0 0 0 0 2 0 0  PHQ- 9 Score - 9 0 3 7 - -    Fall Risk Fall Risk  10/28/2021 09/05/2021 08/08/2021 04/18/2021 02/10/2021  Falls in the past year? 1 0 1 0 0  Comment - - - - -  Number falls in past yr: 1 0 0 0 0  Injury with Fall? 1 0 0 0 0  Risk for fall due to : Impaired balance/gait;History of fall(s);Other (Comment) - - - -  Risk for fall due to: Comment slipped on water - - - -  Follow up Falls prevention discussed - - - -    FALL RISK PREVENTION PERTAINING TO THE HOME:  Any stairs in or around the home? Yes  If so, are there any without handrails? No  Home free of loose throw rugs in walkways, pet beds, electrical cords, etc? Yes  Adequate lighting in your home to reduce risk of falls? Yes   ASSISTIVE DEVICES UTILIZED TO PREVENT FALLS:  Life alert? No  Use of a cane, walker or w/c? Yes , can and walker Grab bars in the bathroom? Yes  Shower chair or bench in shower? Yes  Elevated toilet seat or a handicapped toilet? Yes   TIMED UP AND GO:  Was the test performed? No , visit completed over the phone.  Cognitive Function: Normal cognitive status assessed bythis Nurse Health Advisor. No abnormalities found.   MMSE - Mini Mental State Exam 01/25/2021 10/13/2019 10/04/2018 09/18/2017 08/03/2016  Orientation to time 5 5 5 5 5   Orientation to Place 5 5 5 5 5   Registration  3 3 3 3 3   Attention/ Calculation 3 4 0 0 0  Recall 3 3 3 3 3   Language- name 2 objects 2 - 0 0 0  Language- repeat 1 1 1 1 1   Language- follow 3 step command 2 - 3 3 3   Language- read & follow direction 1 - 0 0 0  Write a sentence 1 - 0 0 0  Copy design 0 - 0 0 0  Total score 26 - 20 20 20    Montreal Cognitive Assessment  02/11/2019  Visuospatial/ Executive (0/5) 3  Naming (0/3) 2  Attention: Read list of digits (0/2) 1  Attention: Read list of letters (0/1) 1  Attention: Serial 7 subtraction starting at 100  (0/3) 2  Language: Repeat phrase (0/2) 1  Language : Fluency (0/1) 0  Abstraction (0/2) 0  Delayed Recall (0/5) 5  Orientation (0/6) 6  Total 21  Adjusted Score (based on education) 22      Immunizations Immunization History  Administered Date(s) Administered   Fluad Quad(high Dose 65+) 08/12/2020   Influenza Split 08/18/2011, 10/11/2012   Influenza Whole 09/25/2007, 09/08/2008, 08/23/2009, 09/02/2010   Influenza, High Dose Seasonal PF 09/28/2019   Influenza,inj,Quad PF,6+ Mos 09/02/2013, 08/13/2014, 10/28/2015, 11/14/2016, 09/18/2017, 08/15/2018   Influenza,inj,quad, With Preservative 09/27/2018   PFIZER(Purple Top)SARS-COV-2 Vaccination 01/22/2020, 02/19/2020   Pneumococcal Conjugate-13 05/21/2014   Pneumococcal Polysaccharide-23 05/26/2015   Tdap 08/18/2011   Zoster, Live 10/12/2010    TDAP status: Due, Education has been provided regarding the importance of this vaccine. Advised may receive this vaccine at local pharmacy or Health Dept. Aware to provide a copy of the vaccination record if obtained from local pharmacy or Health Dept. Verbalized acceptance and understanding.  Flu Vaccine status: Due, Education has been provided regarding the importance of this vaccine. Advised may receive this vaccine at local pharmacy or Health Dept. Aware to provide a copy of the vaccination record if obtained from local pharmacy or Health Dept. Verbalized acceptance and understanding.  Pneumococcal vaccine status: Up to date  Covid-19 vaccine status: Information provided on how to obtain vaccines.   Qualifies for Shingles Vaccine? Yes   Zostavax completed Yes   Shingrix Completed?: No.    Education has been provided regarding the importance of this vaccine. Patient has been advised to call insurance company to determine out of pocket expense if they have not yet received this vaccine. Advised may also receive vaccine at local pharmacy or Health Dept. Verbalized acceptance and  understanding.  Screening Tests Health Maintenance  Topic Date Due   Zoster Vaccines- Shingrix (1 of 2) Never done   COVID-19 Vaccine (3 - Booster for Pfizer series) 04/15/2020   INFLUENZA VACCINE  06/27/2021   TETANUS/TDAP  08/17/2021   MAMMOGRAM  12/06/2021   COLONOSCOPY (Pts 45-69yrs Insurance coverage will need to be confirmed)  05/12/2024   Pneumonia Vaccine 18+ Years old  Completed   DEXA SCAN  Completed   Hepatitis C Screening  Completed   HPV VACCINES  Aged Out    Health Maintenance  Health Maintenance Due  Topic Date Due   Zoster Vaccines- Shingrix (1 of 2) Never done   COVID-19 Vaccine (3 - Booster for Pfizer series) 04/15/2020   INFLUENZA VACCINE  06/27/2021   TETANUS/TDAP  08/17/2021    Colorectal cancer screening: Type of screening: Colonoscopy. Completed 05/12/21. Repeat every 3 years  Mammogram status: Completed 12/06/20. Repeat every year  Bone Density status: Ordered 10/28/21. Pt provided with contact info and advised to  call to schedule appt.  Lung Cancer Screening: (Low Dose CT Chest recommended if Age 83-80 years, 30 pack-year currently smoking OR have quit w/in 15years.) does not qualify.     Additional Screening:  Hepatitis C Screening: does qualify; Completed 5/117/17  Vision Screening: Recommended annual ophthalmology exams for early detection of glaucoma and other disorders of the eye. Is the patient up to date with their annual eye exam?  Yes  Who is the provider or what is the name of the office in which the patient attends annual eye exams? Dr. Sabra Heck   Dental Screening: Recommended annual dental exams for proper oral hygiene  Community Resource Referral / Chronic Care Management: CRR required this visit?  No   CCM required this visit?  No      Plan:     I have personally reviewed and noted the following in the patient's chart:   Medical and social history Use of alcohol, tobacco or illicit drugs  Current medications and  supplements including opioid prescriptions.  Functional ability and status Nutritional status Physical activity Advanced directives List of other physicians Hospitalizations, surgeries, and ER visits in previous 12 months Vitals Screenings to include cognitive, depression, and falls Referrals and appointments  In addition, I have reviewed and discussed with patient certain preventive protocols, quality metrics, and best practice recommendations. A written personalized care plan for preventive services as well as general preventive health recommendations were provided to patient.   Due to this being a telephonic visit, the after visit summary with patients personalized plan was offered to patient via mail or my-chart. Patient would like to access on my-chart.    Loma Messing, LPN   [id number]/12/8411   Nurse Health Advisor  Nurse Notes: none

## 2021-10-28 NOTE — Discharge Instructions (Signed)
Your x-rays did not show any obvious broken bones in your ribs.  However, I explained that x-rays often miss small fractures.  I think it is very possible that you have 1 or more broken ribs.  This should heal on their own over the next 4 to 6 weeks.  You can continue using Tylenol and tramadol at home as needed for pain.  I gave you an incentive spirometer to use at home to prevent pneumonia.  You should use this 10 breaths at a time, 10 times a day, for the next 10 days.  You can go to your regular doctors appointments including physical therapy.  Please tell them that there may be a rib fracture that they can be gentle with you.

## 2021-10-28 NOTE — ED Triage Notes (Signed)
Patient sent to [facility] from PCP for x-ray evaluation of fall after slipping in water and falling onto her chest, complains of right rib pain. Patient alert, oriented, and in no apparent distress at this time.

## 2021-10-28 NOTE — Telephone Encounter (Signed)
[facility] - Client TELEPHONE ADVICE RECORD AccessNurse Patient Name: [name] WS Gender: Female DOB: 01-08-1946 Age: 75 Y 71 M 2 D Return Phone Number: [phone] (Primary) Address: City/ [place]  [address] Client [facility] - Client Client Site [place] - Day Contact Type Call Who Is Calling Patient / Member / Family / Caregiver Call Type Triage / Clinical Relationship To Patient Self Return Phone Number [phone] (Primary) Chief Complaint CHEST PAIN - pain, pressure, heaviness or tightness Reason for Call Symptomatic / Request for [place] states a patient that fell and that has chest pain and pain on the side of her wrist. Translation No Nurse Assessment Nurse: Glean Salvo, RN, Magda Paganini Date/Time (Eastern Time): 10/28/2021 10:34:47 AM Confirm and document reason for call. If symptomatic, describe symptoms. ---Caller states that she fell last night, slipped in some water. Landed on her stomach and chest. Did not hit head, no loc Does the patient have any new or worsening symptoms? ---Yes Will a triage be completed? ---Yes Related visit to physician within the last 2 weeks? ---No Does the PT have any chronic conditions? (i.e. diabetes, asthma, this includes High risk factors for pregnancy, etc.) ---Yes List chronic conditions. ---Parkinson's Is this a behavioral health or substance abuse call? ---No Guidelines Guideline Title Affirmed Question Affirmed Notes Nurse Date/Time Eilene Ghazi Time) Chest Injury SEVERE chest pain Rebecca Eaton 10/28/2021 10:38:57 AM Disp. Time Eilene Ghazi Time) Disposition Final User 10/28/2021 10:31:13 AM Send to Urgent Joen Laura, Arianna 10/28/2021 10:39:40 AM Go to ED Now Yes Glean Salvo, RN, Magda Paganini PLEASE NOTE: All timestamps contained within this report are represented as [place] Standard  Time. CONFIDENTIALTY NOTICE: This fax transmission is intended only for the addressee. It contains information that is legally privileged, confidential or otherwise protected from use or disclosure. If you are not the intended recipient, you are strictly prohibited from reviewing, disclosing, copying using or disseminating any of this information or taking any action in reliance on or regarding this information. If you have received this fax in error, please notify us immediately by telephone so that we can arrange for its return to [place]. Phone: [phone], Toll-Free: [phone], Fax: [phone] Page: 2 of 2 Call Id: [id number] [place] Disagree/Comply Comply Caller Understands Yes PreDisposition Call Doctor Care Advice Given Per Guideline GO TO ED NOW: * You need to be seen in the Emergency Department. * Go to the ED at ___________ [place] now. Drive carefully. CARE ADVICE given per Chest Injury (Adult) guideline. Referrals [place]

## 2021-10-28 NOTE — ED Provider Notes (Signed)
[facility] EMERGENCY DEPARTMENT Provider Note   CSN: [id number] Arrival date & time: 10/28/21  1215     History Chief Complaint  Patient presents with   [name]    [name] is a 75 y.o. female present emergency department with a fall and chest pain.  The patient reports she slipped on water in her garage yesterday evening, landed on her chest.  She has been having bilateral chest pain since then.  Is worse with movement and inspiration.  She does have a history of broken rib in the past.  She is on tramadol at home chronically for pain.  She is able to ambulate with her walker.  She is here with her son at bedside.  No head injury or loss of conscious  HPI     Past Medical History:  Diagnosis Date   Allergy    ANEMIA-NOS 09/25/2007   Anxiety    Arthritis    B12 DEFICIENCY 05/03/2007   Blood transfusion without reported diagnosis    CAD (coronary artery disease) 01/2010   MI, Nishan   Cardiomyopathy ([place]) 02/08/2010   H/o this 2012 after urosepsis, no recurrence.    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   OSTEOPOROSIS 08/2009   bisphosphonate on hold 2/2 dysphagia, on reclast done in August each year   Parkinson's disease ([place]) 08/25/2015   Dx Dr [name] 07/2015    PONV (postoperative nausea and vomiting)    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   Takotsubo cardiomyopathy 2008   due to E coli urosepsis    Patient Active  Problem List   Diagnosis Date Noted   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   Dyskinesia 07/22/2021   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   Closed fracture of rib of right side with routine healing 05/28/2021   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   Cough 12/20/2020   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   Left shoulder pain 06/19/2020   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   Degenerative lumbar spinal stenosis 11/27/2019   Knee pain, bilateral 08/06/2018   Constipation 08/06/2018   Right lumbar radiculopathy 04/10/2018   Dysphagia 10/09/2017   Chronic cough 07/16/2017   GAD (generalized anxiety disorder) 04/28/2016   Chronic insomnia 03/07/2016   Left Achilles tendinitis 12/08/2015   Parkinson's disease ([place]) 08/25/2015   Advanced care planning/counseling discussion 05/26/2015   Family circumstance 02/23/2015   Health maintenance examination 05/21/2014   MDD (major depressive disorder), single episode, moderate ([place]) 11/12/2013   DDD (degenerative disc disease), lumbar 05/31/2013   Medicare annual wellness visit, subsequent 05/20/2013   HLD (hyperlipidemia) 05/20/2013   Vitamin D deficiency 05/09/2013   Recurrent UTI 01/03/2011   Rash and other nonspecific skin eruption 05/11/2010   GERD 02/22/2010   CAD (coronary artery disease) 01/25/2010   Osteoporosis 11/10/2009   Orthostatic hypotension 12/06/2008  Anemia 09/25/2007   Vitamin B12 deficiency 05/03/2007   Fibromyalgia 05/03/2007    Past Surgical History:  Procedure Laterality Date   ABDOMINAL HYSTERECTOMY  1970s   IUD infection - first partial then with oophorectomy (cysts), complication - low blood pressure   CATARACT EXTRACTION Bilateral    CHOLECYSTECTOMY     complication - low blood pressure   COLONOSCOPY  06/2008   h/o polyps but latest WNL, rec rpt 10 yrs Olevia Perches)   COLONOSCOPY  11/2018   multiple TAs (10 polyps total), rpt 2 yrs Fuller Plan)    CYSTOSCOPY  12/2013   abx treatment for recurrent cystitis   DEXA  04/2013   T -2.9 @ femur, -1.6 @ spine   DEXA  04/2017   T -2.9 hip, -0.7 spine   ESOPHAGOGASTRODUODENOSCOPY  12/2017   WNL, regardless esophagus dilated, small HH [facility])   EYE SURGERY     LUMBAR LAMINECTOMY/DECOMPRESSION MICRODISCECTOMY Right 11/27/2019   Right Lumbar Four-Five foraminotomy;  Erline Levine, MD)   MINOR PLACEMENT OF FIDUCIAL N/A 06/30/2021   Procedure: MINOR PLACEMENT OF FIDUCIAL;  Surgeon: Erline Levine, MD;  Location: [place];  Service: Neurosurgery;  Laterality: N/A;  Minor room   PTOSIS REPAIR Bilateral 10/2020   Plastic Surgery   PULSE GENERATOR IMPLANT Right 07/14/2021   Procedure: UNILATERAL PULSE GENERATOR IMPLANT;  Surgeon: Erline Levine, MD;  Location: [place];  Service: Neurosurgery;  Laterality: Right;   SUBTHALAMIC STIMULATOR INSERTION Bilateral 07/07/2021   Procedure: Deep brain stimulator placement;  Surgeon: Erline Levine, MD;  Location: [place];  Service: Neurosurgery;  Laterality: Bilateral;   TUBAL LIGATION     UPPER GASTROINTESTINAL ENDOSCOPY       OB History   No obstetric history on file.     Family History  Problem Relation Age of Onset   Heart attack Father    Diabetes Father    Prostate cancer Father    Esophageal cancer Mother    Lung cancer Brother    Breast cancer Sister    Ovarian cancer Sister    Lung cancer Brother    CAD Brother    Uterine cancer Sister    Clotting disorder Son    Healthy Son    Healthy Daughter    Healthy Daughter    Colon cancer Neg Hx    Rectal cancer Neg Hx    Stomach cancer Neg Hx     Social History   Tobacco Use   Smoking status: Never   Smokeless tobacco: Never  Vaping Use   Vaping Use: Never used  Substance Use Topics   Alcohol use: No   Drug use: Not Currently    Types: Nitrous oxide    Home Medications Prior to Admission medications   Medication Sig Start Date End Date Taking? Authorizing Provider  acetaminophen  (TYLENOL) 650 MG CR tablet Take 2 tablets (1,300 mg total) by mouth every 8 (eight) hours as needed for pain. 05/17/21   Dwyane Dee, MD  AMBULATORY NON FORMULARY MEDICATION Lift chair Dx:  [name] 09/29/20   TatEustace Quail, DO  Calcium Carbonate-Vitamin D (CALCIUM 600/VITAMIN D) 600-400 MG-UNIT chew tablet Chew 1 tablet by mouth daily. 12/10/17   [name], MD  carbidopa-levodopa (SINEMET IR) 25-100 MG tablet TAKE 1 TABLETS BY MOUTH AT 6 AM ,0.5 TABLET AT 9:30 AM, 0.5 PM AND 4:30 PM. MAY TAKE AN EXTRA ONE-HALF TABLET BY MOUTH AS NEEDED IN THE EVENING Patient taking differently: Take 0.5 tablets by mouth in the morning and at  bedtime. 0.5  TABLET BID BY MOUTH AS NEEDED IN THE EVENING 07/22/21   [name], Eustace Quail, DO  Cholecalciferol (VITAMIN D3) 25 MCG (1000 UT) CAPS Take 1 capsule (1,000 Units total) by mouth daily. 10/10/18   [name], MD  clonazePAM (KLONOPIN) 0.5 MG tablet Take 0.5 tablets (0.25 mg total) by mouth every morning AND 1 tablet (0.5 mg total) at bedtime. 10/19/21   [name], MD  clotrimazole-betamethasone (LOTRISONE) cream Apply 1 application topically daily. 06/07/21   [name], MD  cyanocobalamin (,VITAMIN B-12,) 1000 MCG/ML injection INJECT 1 ML (1,000 MCG TOTAL) INTO THE MUSCLE EVERY 30 (THIRTY) DAYS. 10/07/19   [name], MD  denosumab (PROLIA) 60 MG/ML SOSY injection Inject 60 mg into the skin every 6 (six) months.    [provider]  fludrocortisone (FLORINEF) 0.1 MG tablet Take 1 tablet (0.1 mg total) by mouth daily. 07/20/21   [name], MD  Glycerin-Hypromellose-PEG 400 (ARTIFICIAL TEARS) 0.2-0.2-1 % SOLN Place 1 drop into both eyes daily as needed (dry eyes).    [provider]  latanoprost (XALATAN) 0.005 % ophthalmic solution Place 1 drop into both eyes at bedtime. 03/22/21   [provider]  linaclotide Rolan Lipa) 145 MCG CAPS capsule Take 1 capsule (145 mcg total) by mouth daily before breakfast. For  constipation 09/21/21   [name], MD  sertraline (ZOLOFT) 100 MG tablet TAKE 1 TABLET BY MOUTH  DAILY Patient taking differently: Take by mouth every morning. 07/05/21   [name], MD  traMADol (ULTRAM) 50 MG tablet Take 1 tablet (50 mg total) by mouth every 6 (six) hours as needed for moderate pain. 07/08/21   Erline Levine, MD    Allergies    Amitriptyline, Ciprofloxacin, Cymbalta [duloxetine hcl], Iohexol, Lyrica [pregabalin], Imipramine hcl, Iodine, Lidocaine hcl, Morphine sulfate, Neosporin [neomycin-bacitracin zn-polymyx], Sulfamethoxazole, and Tetracyclines & related  Review of Systems   Review of Systems  Constitutional:  Negative for chills and fever.  Eyes:  Negative for pain and visual disturbance.  Respiratory:  Negative for cough and shortness of breath.   Cardiovascular:  Positive for chest pain. Negative for palpitations.  Gastrointestinal:  Negative for abdominal pain and vomiting.  Genitourinary:  Negative for dysuria and hematuria.  Musculoskeletal:  Positive for arthralgias and myalgias.  Skin:  Negative for color change and rash.  Neurological:  Negative for syncope and headaches.  All other systems reviewed and are negative.  Physical Exam Updated Vital Signs BP 138/73 (BP Location: Left Arm)   Pulse 66   Temp 98.6 F (37 C) (Oral)   Resp 16   SpO2 99%   Physical Exam Constitutional:      General: She is not in acute distress. HENT:     Head: Normocephalic and atraumatic.  Eyes:     Conjunctiva/sclera: Conjunctivae normal.     Pupils: Pupils are equal, round, and reactive to light.  Cardiovascular:     Rate and Rhythm: Normal rate and regular rhythm.  Pulmonary:     Effort: Pulmonary effort is normal. No respiratory distress.  Abdominal:     General: There is no distension.     Tenderness: There is no abdominal tenderness.  Musculoskeletal:     Comments: Focal lateral (left and rib) rib tenderness, sternal ttp  Skin:    General: Skin  is warm and dry.  Neurological:     General: No focal deficit present.     Mental Status: She is alert and oriented to person, place, and time. Mental status  is at baseline.  Psychiatric:        Mood and Affect: Mood normal.        Behavior: Behavior normal.    ED Results / Procedures / Treatments   Labs (all labs ordered are listed, but only abnormal results are displayed) Labs Reviewed - No data to display  EKG None  Radiology DG Chest 2 View  Result Date: 10/28/2021 CLINICAL DATA:  Fall. Additional history provided: Chest pain, upper back pain, posterior central neck tenderness status post fall. History of CAD, CHF, GERD. EXAM: CHEST - 2 VIEW COMPARISON:  Chest CT 05/16/2021. Radiographs of the chest and right ribs 05/16/2021. Prior chest radiographs 07/16/2017 and earlier. FINDINGS: A pulse generator overlies the right chest with leads extending superiorly within the right neck. Heart size within normal limits. No appreciable airspace consolidation. No evidence of pleural effusion or pneumothorax. No acute bony abnormality identified. Known chronic fracture deformities of the right eighth and ninth ribs, now with bridging callus formation. Degenerative changes of the spine. IMPRESSION: No evidence of acute cardiopulmonary abnormality. Known chronic fracture deformities of the right eighth and ninth ribs, now bridging callus formation. Electronically Signed   By: Kellie Simmering D.O.   On: 10/28/2021 15:02   DG Cervical Spine Complete  Result Date: 10/28/2021 CLINICAL DATA:  Neck pain after fall EXAM: CERVICAL SPINE - COMPLETE 4+ VIEW COMPARISON:  CT cervical spine 05/16/2021 FINDINGS: No acute fracture or subluxation visualized. Atlantodental interval and lateral masses of C1 and C2 appear within normal limits. Facets are aligned. No prevertebral soft tissue swelling visualized. Moderate intervertebral disc space narrowing at C5-C6 with associated endplate sclerosis. Multilevel likely mild  bilateral neural foraminal narrowing. IMPRESSION: Chronic degenerative changes with no acute fracture identified. Electronically Signed   By: Ofilia Neas M.D.   On: 10/28/2021 15:05   DG Thoracic Spine W/Swimmers  Result Date: 10/28/2021 CLINICAL DATA:  Back pain EXAM: THORACIC SPINE - 3 VIEWS COMPARISON:  Thoracic spine x-ray 05/16/2021 FINDINGS: No acute fracture or subluxation identified. Mild intervertebral disc space narrowing throughout the thoracic spine. No suspicious bony lesions appreciated. IMPRESSION: No acute fracture identified. Electronically Signed   By: Ofilia Neas M.D.   On: 10/28/2021 15:07    Procedures Procedures   Medications Ordered in ED Medications  acetaminophen (TYLENOL) tablet 650 mg (650 mg Oral Given 10/28/21 1413)    ED Course  I have reviewed the triage vital signs and the nursing notes.  Pertinent labs & imaging results that were available during my care of the patient were reviewed by me and considered in my medical decision making (see chart for details).  Mechanical fall with injury to the chest.  I personally reviewed her x-ray films did not show any evident fractures.  Clinically however I do think it is possible that she may have bilateral nondisplaced rib fractures, given her tenderness on exam.  There is also some possibility of a small nondisplaced sternal fracture with some sternal tenderness.  She is breathing comfortably however.  I discussed with her and her son's status from a chair at home to prevent pneumonia.  Advised to continue taking the tramadol as needed for pain, for which she is chronically prescribed.  She can also use Tylenol, which she is using at home.  I was able to answer all their questions.    Final Clinical Impression(s) / ED Diagnoses Final diagnoses:  Fall, initial encounter  Rib pain    Rx / DC Orders ED Discharge Orders  None        Wyvonnia Dusky, MD 10/28/21 [phone]

## 2021-10-28 NOTE — ED Provider Notes (Signed)
Emergency Medicine Provider Triage Evaluation Note  [name] , a 75 y.o. female  was evaluated in triage.  Pt complains of chest and bilateral rib pain.  Patient states that she was sent here from her primary care for x-ray evaluation after fall.  She states that yesterday she was getting out of the car in the garage.  States that there was water on the ground and she slipped and fell on it.  She states that she fell onto her chest.  She denies hitting her head or loss of consciousness.  She denies any prodromal symptoms or syncope with the fall.  She states she did have fallen June with rib fractures.  She states that she uses a walker due to her Parkinson's, however she was not using her walker for the short transition.  Review of Systems  Positive: See above Negative:   Physical Exam  BP (!) 156/73 (BP Location: Left Arm)   Pulse 75   Temp 97.8 F (36.6 C) (Oral)   Resp 16   SpO2 99%  Gen:   Awake, no distress   Resp:  Normal effort, lungs CTA B MSK:   Moves extremities without difficulty  Other:  No ecchymoses noted to the anterior chest.  She does have tenderness to palpation of the chest wall and bilateral ribs.  She does have tenderness to palpation of the thoracic and lower C-spine, states that she does have chronic back pain from bulging disks.  Medical Decision Making  Medically screening exam initiated at 2:08 PM.  Appropriate orders placed.  [name] was informed that the remainder of the evaluation will be completed by another provider, this initial triage assessment does not replace that evaluation, and the importance of remaining in the ED until their evaluation is complete.     Mickie Hillier, PA-C 10/28/21 1410    Horton, Alvin Critchley, DO 10/28/21 1645

## 2021-10-28 NOTE — Patient Instructions (Signed)
[name] , Thank you for taking time to complete your Medicare Wellness Visit. I appreciate your ongoing commitment to your health goals. Please review the following plan we discussed and let me know if I can assist you in the future.   Screening recommendations/referrals: Colonoscopy: up to date, completed 05/12/21, due 05/12/24 Mammogram: up to date, completed 12/06/20, due 12/06/21 Bone Density: due, last completed 11/04/19, ordered today, someone will call to schedule Recommended yearly ophthalmology/optometry visit for glaucoma screening and checkup Recommended yearly dental visit for hygiene and checkup  Vaccinations: Influenza vaccine: Due-May obtain vaccine at our office or your local pharmacy. Pneumococcal vaccine: up to date Tdap vaccine: Due last completed 08/18/11-May obtain vaccine at your local pharmacy. Shingles vaccine: Discuss with your local pharmacy Covid-19:newest booster available at your local pharmacy  Advanced directives: copy on file  Conditions/risks identified: see problem list  Next appointment: Follow up in one year for your annual wellness visit    Preventive Care 65 Years and Older, Female Preventive care refers to lifestyle choices and visits with your health care provider that can promote health and wellness. What does preventive care include? A yearly physical exam. This is also called an annual well check. Dental exams once or twice a year. Routine eye exams. Ask your health care provider how often you should have your eyes checked. Personal lifestyle choices, including: Daily care of your teeth and gums. Regular physical activity. Eating a healthy diet. Avoiding tobacco and drug use. Limiting alcohol use. Practicing safe sex. Taking low-dose aspirin every day. Taking vitamin and mineral supplements as recommended by your health care provider. What happens during an annual well check? The services and screenings done by your health care provider  during your annual well check will depend on your age, overall health, lifestyle risk factors, and family history of disease. Counseling  Your health care provider may ask you questions about your: Alcohol use. Tobacco use. Drug use. Emotional well-being. Home and relationship well-being. Sexual activity. Eating habits. History of falls. Memory and ability to understand (cognition). Work and work Statistician. Reproductive health. Screening  You may have the following tests or measurements: Height, weight, and BMI. Blood pressure. Lipid and cholesterol levels. These may be checked every 5 years, or more frequently if you are over 62 years old. Skin check. Lung cancer screening. You may have this screening every year starting at age 38 if you have a 30-pack-year history of smoking and currently smoke or have quit within the past 15 years. Fecal occult blood test (FOBT) of the stool. You may have this test every year starting at age 59. Flexible sigmoidoscopy or colonoscopy. You may have a sigmoidoscopy every 5 years or a colonoscopy every 10 years starting at age 19. Hepatitis C blood test. Hepatitis B blood test. Sexually transmitted disease (STD) testing. Diabetes screening. This is done by checking your blood sugar (glucose) after you have not eaten for a while (fasting). You may have this done every 1-3 years. Bone density scan. This is done to screen for osteoporosis. You may have this done starting at age 48. Mammogram. This may be done every 1-2 years. Talk to your health care provider about how often you should have regular mammograms. Talk with your health care provider about your test results, treatment options, and if necessary, the need for more tests. Vaccines  Your health care provider may recommend certain vaccines, such as: Influenza vaccine. This is recommended every year. Tetanus, diphtheria, and acellular pertussis (Tdap, Td) vaccine.  You may need a Td booster every  10 years. Zoster vaccine. You may need this after age 11. Pneumococcal 13-valent conjugate (PCV13) vaccine. One dose is recommended after age 49. Pneumococcal polysaccharide (PPSV23) vaccine. One dose is recommended after age 3. Talk to your health care provider about which screenings and vaccines you need and how often you need them. This information is not intended to replace advice given to you by your health care provider. Make sure you discuss any questions you have with your health care provider. Document Released: 12/10/2015 Document Revised: 08/02/2016 Document Reviewed: 09/14/2015 Elsevier Interactive Patient Education  2017 [employer] Prevention in the Home Falls can cause injuries. They can happen to people of all ages. There are many things you can do to make your home safe and to help prevent falls. What can I do on the outside of my home? Regularly fix the edges of walkways and driveways and fix any cracks. Remove anything that might make you trip as you walk through a door, such as a raised step or threshold. Trim any bushes or trees on the path to your home. Use bright outdoor lighting. Clear any walking paths of anything that might make someone trip, such as rocks or tools. Regularly check to see if handrails are loose or broken. Make sure that both sides of any steps have handrails. Any raised decks and porches should have guardrails on the edges. Have any leaves, snow, or ice cleared regularly. Use sand or salt on walking paths during winter. Clean up any spills in your garage right away. This includes oil or grease spills. What can I do in the bathroom? Use night lights. Install grab bars by the toilet and in the tub and shower. Do not use towel bars as grab bars. Use non-skid mats or decals in the tub or shower. If you need to sit down in the shower, use a plastic, non-slip stool. Keep the floor dry. Clean up any water that spills on the floor as soon as it  happens. Remove soap buildup in the tub or shower regularly. Attach bath mats securely with double-sided non-slip rug tape. Do not have throw rugs and other things on the floor that can make you trip. What can I do in the bedroom? Use night lights. Make sure that you have a light by your bed that is easy to reach. Do not use any sheets or blankets that are too big for your bed. They should not hang down onto the floor. Have a firm chair that has side arms. You can use this for support while you get dressed. Do not have throw rugs and other things on the floor that can make you trip. What can I do in the kitchen? Clean up any spills right away. Avoid walking on wet floors. Keep items that you use a lot in easy-to-reach places. If you need to reach something above you, use a strong step stool that has a grab bar. Keep electrical cords out of the way. Do not use floor polish or wax that makes floors slippery. If you must use wax, use non-skid floor wax. Do not have throw rugs and other things on the floor that can make you trip. What can I do with my stairs? Do not leave any items on the stairs. Make sure that there are handrails on both sides of the stairs and use them. Fix handrails that are broken or loose. Make sure that handrails are as long as  the stairways. Check any carpeting to make sure that it is firmly attached to the stairs. Fix any carpet that is loose or worn. Avoid having throw rugs at the top or bottom of the stairs. If you do have throw rugs, attach them to the floor with carpet tape. Make sure that you have a light switch at the top of the stairs and the bottom of the stairs. If you do not have them, ask someone to add them for you. What else can I do to help prevent falls? Wear shoes that: Do not have high heels. Have rubber bottoms. Are comfortable and fit you well. Are closed at the toe. Do not wear sandals. If you use a stepladder: Make sure that it is fully opened.  Do not climb a closed stepladder. Make sure that both sides of the stepladder are locked into place. Ask someone to hold it for you, if possible. Clearly mark and make sure that you can see: Any grab bars or handrails. First and last steps. Where the edge of each step is. Use tools that help you move around (mobility aids) if they are needed. These include: Canes. Walkers. Scooters. Crutches. Turn on the lights when you go into a dark area. Replace any light bulbs as soon as they burn out. Set up your furniture so you have a clear path. Avoid moving your furniture around. If any of your floors are uneven, fix them. If there are any pets around you, be aware of where they are. Review your medicines with your doctor. Some medicines can make you feel dizzy. This can increase your chance of falling. Ask your doctor what other things that you can do to help prevent falls. This information is not intended to replace advice given to you by your health care provider. Make sure you discuss any questions you have with your health care provider. Document Released: 09/09/2009 Document Revised: 04/20/2016 Document Reviewed: 12/18/2014 Elsevier Interactive Patient Education  2017 [employer].

## 2021-10-31 ENCOUNTER — Ambulatory Visit: Payer: Medicare Other | Admitting: Neurology

## 2021-10-31 ENCOUNTER — Other Ambulatory Visit: Payer: Self-pay

## 2021-10-31 ENCOUNTER — Encounter: Payer: Self-pay | Admitting: Neurology

## 2021-10-31 DIAGNOSIS — G20 Parkinson's disease: Secondary | ICD-10-CM | POA: Diagnosis not present

## 2021-10-31 NOTE — Procedures (Signed)
DBS Programming was performed.    Manufacturer of DBS device: [employer], bluetooth  Total time spent programming was 24 minutes.  Device was confirmed to be on.  Soft start was confirmed to be on.  Impedences were checked and were within normal limits.  Battery was checked and was determined to be functioning normally and not near the end of life.  Final settings were as follows:    Active Contact Amplitude (mA) PW (ms) Frequency (hz) Side Effects  Left Brain       08/08/21 3-C+ 1.7 60 130   Other trials        1-C+ 2.7 60 130 tremor   (2/3/4)-C+ 3.0 60 130 Face pull above 2.1   (5/6/7)-C+ 1.8 60 130 ? Face pull (some baseline facial asymmetry)  08/22/21 5-(25%)7-(75%)C+ 2.5 60 159   09/05/21 final 5-(25%)6-(75%)C+ 2.9 60 159   Other trials 5-(50%)6-(50%)C+ 2.9 60 159    5-(25%)7-(75%)C+ 3.5 60 159 Tremor not controlled  10/31/21 5-(25%)7-(75%)C+ 3.3 60 159                        Right Brain       08/08/21 (5/6/7)-C+ 1.6 60 130   Other trials 1-C+ 1.5 60 130 ? Speech change   (2/3/4)-C+ 1.5 60 130 Pretty good   8-C+ 1.6 60 130 tremor  08/22/21 8-C+ 2.7 60 136   09/05/21 final 6-(50%)8-(50%)C+ 2.9 60 159   others 6-(60%)8-(40%)C+ 2.8  60 136 Mouth dyskinesia  10/31/21 6-(50%)8-(50%)C+ 3.4 60 159

## 2021-11-01 DIAGNOSIS — M5416 Radiculopathy, lumbar region: Secondary | ICD-10-CM | POA: Diagnosis not present

## 2021-11-01 NOTE — Telephone Encounter (Signed)
Seen at ER with reassuring xrays but possible clinical rib fractures.  Plz call for update on how chest wall pain is doing on PRN tramadol.

## 2021-11-01 NOTE — Telephone Encounter (Signed)
Lvm asking pt to call back.  Need to get update on pt's pain and how tramadol is helping.

## 2021-11-02 ENCOUNTER — Other Ambulatory Visit: Payer: Self-pay

## 2021-11-02 ENCOUNTER — Ambulatory Visit: Payer: Medicare Other | Admitting: Physical Therapy

## 2021-11-02 ENCOUNTER — Ambulatory Visit (INDEPENDENT_AMBULATORY_CARE_PROVIDER_SITE_OTHER): Payer: Medicare Other | Admitting: Family Medicine

## 2021-11-02 ENCOUNTER — Encounter: Payer: Self-pay | Admitting: Family Medicine

## 2021-11-02 VITALS — BP 134/70 | HR 67 | Temp 97.6°F | Ht 65.5 in | Wt 160.3 lb

## 2021-11-02 DIAGNOSIS — Z23 Encounter for immunization: Secondary | ICD-10-CM | POA: Diagnosis not present

## 2021-11-02 DIAGNOSIS — M51369 Other intervertebral disc degeneration, lumbar region without mention of lumbar back pain or lower extremity pain: Secondary | ICD-10-CM

## 2021-11-02 DIAGNOSIS — I951 Orthostatic hypotension: Secondary | ICD-10-CM

## 2021-11-02 DIAGNOSIS — F5104 Psychophysiologic insomnia: Secondary | ICD-10-CM | POA: Diagnosis not present

## 2021-11-02 DIAGNOSIS — F321 Major depressive disorder, single episode, moderate: Secondary | ICD-10-CM

## 2021-11-02 DIAGNOSIS — G20 Parkinson's disease: Secondary | ICD-10-CM

## 2021-11-02 DIAGNOSIS — K5900 Constipation, unspecified: Secondary | ICD-10-CM | POA: Diagnosis not present

## 2021-11-02 DIAGNOSIS — Z0001 Encounter for general adult medical examination with abnormal findings: Secondary | ICD-10-CM | POA: Diagnosis not present

## 2021-11-02 DIAGNOSIS — E538 Deficiency of other specified B group vitamins: Secondary | ICD-10-CM

## 2021-11-02 DIAGNOSIS — M810 Age-related osteoporosis without current pathological fracture: Secondary | ICD-10-CM

## 2021-11-02 DIAGNOSIS — D649 Anemia, unspecified: Secondary | ICD-10-CM | POA: Diagnosis not present

## 2021-11-02 DIAGNOSIS — E559 Vitamin D deficiency, unspecified: Secondary | ICD-10-CM

## 2021-11-02 DIAGNOSIS — Z7189 Other specified counseling: Secondary | ICD-10-CM | POA: Diagnosis not present

## 2021-11-02 DIAGNOSIS — G20A1 Parkinson's disease without dyskinesia, without mention of fluctuations: Secondary | ICD-10-CM

## 2021-11-02 DIAGNOSIS — M5136 Other intervertebral disc degeneration, lumbar region: Secondary | ICD-10-CM | POA: Diagnosis not present

## 2021-11-02 DIAGNOSIS — M5416 Radiculopathy, lumbar region: Secondary | ICD-10-CM | POA: Diagnosis not present

## 2021-11-02 DIAGNOSIS — E785 Hyperlipidemia, unspecified: Secondary | ICD-10-CM

## 2021-11-02 LAB — CBC WITH DIFFERENTIAL/PLATELET
Basophils Absolute: 0 10*3/uL (ref 0.0–0.1)
Basophils Relative: 0.5 % (ref 0.0–3.0)
Eosinophils Absolute: 0 10*3/uL (ref 0.0–0.7)
Eosinophils Relative: 0.2 % (ref 0.0–5.0)
HCT: 36.9 % (ref 36.0–46.0)
Hemoglobin: 12 g/dL (ref 12.0–15.0)
Lymphocytes Relative: 21.1 % (ref 12.0–46.0)
Lymphs Abs: 1.2 10*3/uL (ref 0.7–4.0)
MCHC: 32.5 g/dL (ref 30.0–36.0)
MCV: 84.9 fl (ref 78.0–100.0)
Monocytes Absolute: 0.3 10*3/uL (ref 0.1–1.0)
Monocytes Relative: 4.9 % (ref 3.0–12.0)
Neutro Abs: 4.2 10*3/uL (ref 1.4–7.7)
Neutrophils Relative %: 73.3 % (ref 43.0–77.0)
Platelets: 252 10*3/uL (ref 150.0–400.0)
RBC: 4.35 Mil/uL (ref 3.87–5.11)
RDW: 15.9 % — ABNORMAL HIGH (ref 11.5–15.5)
WBC: 5.7 10*3/uL (ref 4.0–10.5)

## 2021-11-02 LAB — LIPID PANEL
Cholesterol: 219 mg/dL — ABNORMAL HIGH (ref 0–200)
HDL: 68.9 mg/dL (ref >39.00)
LDL Cholesterol: 130 mg/dL — ABNORMAL HIGH (ref 0–99)
NonHDL: 149.67
Total CHOL/HDL Ratio: 3
Triglycerides: 96 mg/dL (ref 0.0–149.0)
VLDL: 19.2 mg/dL (ref 0.0–40.0)

## 2021-11-02 LAB — COMPREHENSIVE METABOLIC PANEL
ALT: 5 U/L (ref 0–35)
AST: 14 U/L (ref 0–37)
Albumin: 4.2 g/dL (ref 3.5–5.2)
Alkaline Phosphatase: 64 U/L (ref 39–117)
BUN: 14 mg/dL (ref 6–23)
CO2: 30 mEq/L (ref 19–32)
Calcium: 9.5 mg/dL (ref 8.4–10.5)
Chloride: 102 mEq/L (ref 96–112)
Creatinine, Ser: 0.58 mg/dL (ref 0.40–1.20)
GFR: 89.82 mL/min (ref >60.00)
Glucose, Bld: 110 mg/dL — ABNORMAL HIGH (ref 70–99)
Potassium: 4.1 mEq/L (ref 3.5–5.1)
Sodium: 138 mEq/L (ref 135–145)
Total Bilirubin: 0.3 mg/dL (ref 0.2–1.2)
Total Protein: 7.3 g/dL (ref 6.0–8.3)

## 2021-11-02 LAB — VITAMIN D 25 HYDROXY (VIT D DEFICIENCY, FRACTURES): VITD: 37.89 ng/mL (ref 30.00–100.00)

## 2021-11-02 LAB — VITAMIN B12: Vitamin B-12: 344 pg/mL (ref 211–911)

## 2021-11-02 MED ORDER — LUBIPROSTONE 24 MCG PO CAPS: 24.0000 ug | ORAL_CAPSULE | Freq: Two times a day (BID) | ORAL | 1 refills | Status: DC

## 2021-11-02 MED ORDER — MIRABEGRON ER 25 MG PO TB24
25.0000 mg | ORAL_TABLET | Freq: Every day | ORAL | 11 refills | Status: DC
Start: 2021-11-02 — End: 2021-12-23

## 2021-11-02 MED ORDER — CYANOCOBALAMIN 1000 MCG/ML IJ SOLN
1000.0000 ug | Freq: Once | INTRAMUSCULAR | Status: AC
  Administered 2021-11-02: 1000 ug via INTRAMUSCULAR

## 2021-11-02 NOTE — Assessment & Plan Note (Signed)
Ongoing struggle. Manages with PRN dulcolax.  Discussed fiber intake. Miralax intolerance. linzess unaffordable. Will price out amitiza.

## 2021-11-02 NOTE — Progress Notes (Addendum)
Patient ID: [name], female    DOB: August 02, 1946, 75 y.o.   MRN: [id number]  This visit was conducted in person.  BP 134/70   Pulse 67   Temp 97.6 F (36.4 C) (Temporal)   Ht 5' 5.5" (1.664 m)   Wt 160 lb 5 oz (72.7 kg)   SpO2 98%   BMI 26.27 kg/m    CC: CPE Subjective:   HPI: [name] is a 75 y.o. female presenting on 11/02/2021 for Annual Exam [facility] prt 2.  Wants to discuss doing vit B12 inj at home.  Accompanied by son, [name]. )   Saw health advisor last week for medicare wellness visit. Note reviewed.    No results found.  Flowsheet Row Clinical Support from 10/28/2021 in [place] at [place]  PHQ-2 Total Score 0       Fall Risk  10/31/2021 10/28/2021 09/05/2021 08/08/2021 04/18/2021  Falls in the past year? 1 1 0 1 0  Comment - - - - -  Number falls in past yr: 0 1 0 0 0  Injury with Fall? 1 1 0 0 0  Risk for fall due to : - Impaired balance/gait;History of fall(s);Other (Comment) - - -  Risk for fall due to: Comment - slipped on water - - -  Follow up - Falls prevention discussed - - -    Recent DBS placement for PD appreciate neurology and neurosurgery care.  Continues fludrocortisone 0.1mg  daily for PD related orthostatic hypotension. Midodrine caused HA, northera unaffordable.  Continues klonopin 1/2 tab in am and 1 tab at night.  Continues sertraline 100mg  daily.   Recent fall evaluated at ER with no rib fracture on imaging. Slipped on water in the garage. She is taking tylenol 650mg  2 tab TID with tramadol for breakthrough pain. Tramadol through neurosurgery.   Yesterday had first epidural steroid injection into the spine. Considering spinal fusion surgery.   Preventative: COLONOSCOPY Date: 06/2008 h/o polyps but latest WNL, rec rpt 10 yrs [name]). COLONOSCOPY 11/2018 multiple TAs (10 polyps total), rpt 2 yrs [name])  Colonoscopy 04/2021 - multiple TAs, rpt 3 yrs [name]) Mammogram 11/2020 Birads1 @ [facility]  Well woman - s/p  hysterectomy and oophorectomy.  DEXA Date: 04/2013 T -2.9 at femur, -1.6 at spine. Last reclast 06/2014.  DEXA 6/2018T score -2.9 hip  DEXA 10/2019 - T -2.8 hip continue Q10mo prolia (last 08/30/2021). Continues cal/vit D regularly. Update DEXA with upcoming mammogram.  Lung cancer screen - not eligible  Flu yearly  [place] 12/2019, 01/2020, no booster  Prevnar-13 04/2014, pneumovax 04/2015  Tdap 2012  Zostavax 2011  shingrix - discussed  Advanced directive 07/2021 - wants all children involved in her medical decision making but son [name] to lead decision. Doesn't want prolonged life support if terminal condition.  Seat belt use discussed.  Sunscreen use discussed. No changing moles on skin.  Non smoker  Alcohol - none Dentist - has dentures Eye exam q6 mo for glaucoma  Bowel - ongoing constipation. miralax caused abd pain. Linzess was unaffordable. Takes PRN dulcolax.  Bladder - + incontinence previously saw [name] urology. Myrbetriq previously helpful - requests refilled.  Lives with daughter, 1 dog. Widow of husband [name]) 2015. 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  Relevant past medical, surgical, family and social history reviewed and updated as indicated. Interim medical history since our last visit reviewed. Allergies and medications reviewed and updated. Outpatient Medications Prior to Visit  Medication Sig Dispense Refill   acetaminophen (TYLENOL) 650 MG CR tablet Take 2 tablets (1,300 mg total) by mouth every 8 (eight) hours as needed for pain.     AMBULATORY NON FORMULARY MEDICATION Lift chair Dx:  G20 1 Device 0   Calcium Carbonate-Vitamin D (CALCIUM 600/VITAMIN D) 600-400 MG-UNIT chew tablet Chew 1 tablet by mouth daily.     carbidopa-levodopa (SINEMET IR) 25-100  MG tablet TAKE 1 TABLETS BY MOUTH AT 6 AM ,0.5 TABLET AT 9:30 AM, 0.5 PM AND 4:30 PM. MAY TAKE AN EXTRA ONE-HALF TABLET BY MOUTH AS NEEDED IN THE EVENING (Patient taking differently: Take 0.5 tablets by mouth in the morning and at bedtime. 0.5  TABLET BID BY MOUTH AS NEEDED IN THE EVENING) 360 tablet 1   Cholecalciferol (VITAMIN D3) 25 MCG (1000 UT) CAPS Take 1 capsule (1,000 Units total) by mouth daily. 30 capsule    clonazePAM (KLONOPIN) 0.5 MG tablet Take 0.5 tablets (0.25 mg total) by mouth every morning AND 1 tablet (0.5 mg total) at bedtime. 45 tablet 0   clotrimazole-betamethasone (LOTRISONE) cream Apply 1 application topically daily. 30 g 0   cyanocobalamin (,VITAMIN B-12,) 1000 MCG/ML injection INJECT 1 ML (1,000 MCG TOTAL) INTO THE MUSCLE EVERY 30 (THIRTY) DAYS. 3 mL 2   denosumab (PROLIA) 60 MG/ML SOSY injection Inject 60 mg into the skin every 6 (six) months.     fludrocortisone (FLORINEF) 0.1 MG tablet Take 1 tablet (0.1 mg total) by mouth daily. 90 tablet 1   Glycerin-Hypromellose-PEG 400 (ARTIFICIAL TEARS) 0.2-0.2-1 % SOLN Place 1 drop into both eyes daily as needed (dry eyes).     latanoprost (XALATAN) 0.005 % ophthalmic solution Place 1 drop into both eyes at bedtime.     sertraline (ZOLOFT) 100 MG tablet TAKE 1 TABLET BY MOUTH  DAILY (Patient taking differently: Take by mouth every morning.) 90 tablet 3   traMADol (ULTRAM) 50 MG tablet Take 1 tablet (50 mg total) by mouth every 6 (six) hours as needed for moderate pain. 30 tablet 0   linaclotide (LINZESS) 145 MCG CAPS capsule Take 1 capsule (145 mcg total) by mouth daily before breakfast. For constipation 30 capsule 1   No facility-administered medications prior to visit.     Per HPI unless specifically indicated in ROS section below Review of Systems  Constitutional:  Negative for activity change, appetite change, chills, fatigue, fever and unexpected weight change.  HENT:  Negative for hearing loss.   Eyes:  Negative for  visual disturbance.  Respiratory:  Negative for cough, chest tightness, shortness of breath and wheezing.   Cardiovascular:  Positive for chest pain (chest wall pain at ribs). Negative for palpitations and leg swelling.  Gastrointestinal:  Positive for constipation. Negative for abdominal distention, abdominal pain, blood in stool, diarrhea, nausea and vomiting.  Genitourinary:  Negative for difficulty urinating and hematuria.  Musculoskeletal:  Negative for arthralgias, myalgias and neck pain.  Skin:  Negative for rash.  Neurological:  Positive for headaches. Negative for dizziness, seizures and syncope.  Hematological:  Negative for adenopathy. Does not bruise/bleed easily.  Psychiatric/Behavioral:  Negative for dysphoric mood. The patient is not nervous/anxious.    Objective:  BP 134/70   Pulse 67   Temp 97.6 F (36.4 C) (Temporal)   Ht 5' 5.5" (1.664 m)  Wt 160 lb 5 oz (72.7 kg)   SpO2 98%   BMI 26.27 kg/m   Wt Readings from Last 3 Encounters:  11/02/21 160 lb 5 oz (72.7 kg)  10/31/21 156 lb 9.6 oz (71 kg)  10/28/21 155 lb (70.3 kg)      Physical Exam Vitals and nursing note reviewed.  Constitutional:      Appearance: Normal appearance. She is not ill-appearing.  HENT:     Head: Normocephalic and atraumatic.     Right Ear: Tympanic membrane, ear canal and external ear normal. There is no impacted cerumen.     Left Ear: Tympanic membrane, ear canal and external ear normal. There is no impacted cerumen.  Eyes:     General:        Right eye: No discharge.        Left eye: No discharge.     Extraocular Movements: Extraocular movements intact.     Conjunctiva/sclera: Conjunctivae normal.     Pupils: Pupils are equal, round, and reactive to light.  Neck:     Thyroid: No thyroid mass or thyromegaly.     Vascular: No carotid bruit.  Cardiovascular:     Rate and Rhythm: Normal rate and regular rhythm.     Pulses: Normal pulses.     Heart sounds: Normal heart sounds. No  murmur heard. Pulmonary:     Effort: Pulmonary effort is normal. No respiratory distress.     Breath sounds: Normal breath sounds. No wheezing, rhonchi or rales.  Abdominal:     General: Bowel sounds are normal. There is no distension.     Palpations: Abdomen is soft. There is no mass.     Tenderness: There is no abdominal tenderness. There is no guarding or rebound.     Hernia: No hernia is present.  Musculoskeletal:     Cervical back: Normal range of motion and neck supple. No rigidity.     Right lower leg: No edema.     Left lower leg: No edema.  Lymphadenopathy:     Cervical: No cervical adenopathy.  Skin:    General: Skin is warm and dry.     Findings: No rash.  Neurological:     General: No focal deficit present.     Mental Status: She is alert. Mental status is at baseline.  Psychiatric:        Mood and Affect: Mood normal.        Behavior: Behavior normal.      Lab Results  Component Value Date   WBC 12.3 (H) 07/22/2021   HGB 11.9 (L) 07/22/2021   HCT 38.9 07/22/2021   MCV 90.0 07/22/2021   PLT 399 07/22/2021   Lab Results  Component Value Date   VITAMINB12 311 10/25/2020    Depression screen PHQ 2/9 10/28/2021 10/27/2020 10/13/2019 10/04/2018  Decreased Interest 0 0 0 0  Down, Depressed, Hopeless 0 0 0 0  PHQ - 2 Score 0 0 0 0  Altered sleeping - 2 0 2  Tired, decreased energy - 1 0 0  Change in appetite - 2 0 1  Feeling bad or failure about yourself  - 1 0 0  Trouble concentrating - 1 0 0  Moving slowly or fidgety/restless - 2 0 0  Suicidal thoughts - 0 0 0  PHQ-9 Score - 9 0 3  Difficult doing work/chores - - Not difficult at all Not difficult at all  Some recent data might be hidden    GAD 7 :  Generalized Anxiety Score 10/27/2020  Nervous, Anxious, on Edge 1  Control/stop worrying 1  Worry too much - different things 1  Trouble relaxing 1  Restless 1  Easily annoyed or irritable 1  Afraid - awful might happen 0  Total GAD 7 Score 6   Assessment &  [name]:  This visit occurred during the SARS-CoV-2 public health emergency.  Safety protocols were in place, including screening questions prior to the visit, additional usage of staff PPE, and extensive cleaning of exam room while observing appropriate contact time as indicated for disinfecting solutions.   Problem List Items Addressed This Visit     Encounter for general adult medical examination with abnormal findings - Primary (Chronic)    Preventative protocols reviewed and updated unless pt declined. Discussed healthy diet and lifestyle.       Advanced care planning/counseling discussion (Chronic)    Advanced directive - scanned 07/2021 - wants all children involved in her medical decision, starting with son [name]. Doesn't want prolonged life support if terminal condition however ok with tube feeding.      Parkinson's disease ([place]) (Chronic)    Appreciate neuro/neurosurgery care.       Vitamin B12 deficiency    Continue monthly B12 shots.       Anemia    Update CBC.       Orthostatic hypotension    Continue fludrocortisone 0.1mg  daily.       Osteoporosis    Continue Q66mo prolia, last injection completed 08/30/2021.   Update DEXA when she gets mammo next month.       Vitamin D deficiency    Update vit D levels today.       HLD (hyperlipidemia)    Chronic, stable off meds. Update FLP. The 10-year ASCVD risk score (Arnett DK, et al., 2019) is: 14.1%   Values used to calculate the score:     Age: 89 years     Sex: Female     Is Non-Hispanic African American: No     Diabetic: No     Tobacco smoker: No     Systolic Blood Pressure: 976 mmHg     Is BP treated: No     HDL Cholesterol: 53.5 mg/dL     Total Cholesterol: 193 mg/dL       DDD (degenerative disc disease), lumbar    S/p laminectomy and foraminotomy early 2021 Vertell Limber) and more recent ESI (yesterday). Discussing possible rpt surgery.       MDD (major depressive disorder), single episode, moderate (HCC)     Stable period on sertraline 100mg  daily - continue.       Chronic insomnia    Continues clonazepam.       Right lumbar radiculopathy   Constipation    Ongoing struggle. Manages with PRN dulcolax.  Discussed fiber intake. Miralax intolerance. linzess unaffordable. Will price out amitiza.       Other Visit Diagnoses     Need for influenza vaccination       Relevant Orders   Flu Vaccine QUAD High Dose(Fluad) (Completed)        Meds ordered this encounter  Medications   mirabegron ER (MYRBETRIQ) 25 MG TB24 tablet    Sig: Take 1 tablet (25 mg total) by mouth daily.    Dispense:  30 tablet    Refill:  11   lubiprostone (AMITIZA) 24 MCG capsule    Sig: Take 1 capsule (24 mcg total) by mouth 2 (two) times daily with a meal.  Dispense:  60 capsule    Refill:  1   cyanocobalamin ((VITAMIN B-12)) injection 1,000 mcg    Orders Placed This Encounter  Procedures   Flu Vaccine QUAD High Dose(Fluad)    Patient instructions: Labs today then B12 shot.  Flu shot today  If interested, check with pharmacy about new 2 shot shingles series (shingrix).  We will refill Myrbetriq.  Price out [facility] for constipation.  Return in 6 months for follow up visit.   Follow up [name]: Return in about 6 months (around 05/03/2022) for follow up visit.  [name], MD

## 2021-11-02 NOTE — Assessment & Plan Note (Signed)
Preventative protocols reviewed and updated unless pt declined. Discussed healthy diet and lifestyle.  

## 2021-11-02 NOTE — Assessment & Plan Note (Signed)
Advanced directive - scanned 07/2021 - wants all children involved in her medical decision, starting with son [name]. Doesn't want prolonged life support if terminal condition however ok with tube feeding.

## 2021-11-02 NOTE — Assessment & Plan Note (Deleted)
Stable period off PPI 

## 2021-11-02 NOTE — Assessment & Plan Note (Signed)
Appreciate neuro/neurosurgery care.

## 2021-11-02 NOTE — Assessment & Plan Note (Addendum)
Update CBC. 

## 2021-11-02 NOTE — Assessment & Plan Note (Signed)
Update vit D levels today.

## 2021-11-02 NOTE — Patient Instructions (Addendum)
Labs today then B12 shot.  Flu shot today  If interested, check with pharmacy about new 2 shot shingles series (shingrix).  We will refill Myrbetriq.  Price out [facility] [employer] for constipation.  Return in 6 months for follow up visit.   Health Maintenance After Age 73 After age 27, you are at a higher risk for certain long-term diseases and infections as well as injuries from falls. Falls are a major cause of broken bones and head injuries in people who are older than age 40. Getting regular preventive care can help to keep you healthy and well. Preventive care includes getting regular testing and making lifestyle changes as recommended by your health care provider. Talk with your health care provider about: Which screenings and tests you should have. A screening is a test that checks for a disease when you have no symptoms. A diet and exercise plan that is right for you. What should I know about screenings and tests to prevent falls? Screening and testing are the best ways to find a health problem early. Early diagnosis and treatment give you the best chance of managing medical conditions that are common after age 9. Certain conditions and lifestyle choices may make you more likely to have a fall. Your health care provider may recommend: Regular vision checks. Poor vision and conditions such as cataracts can make you more likely to have a fall. If you wear glasses, make sure to get your prescription updated if your vision changes. Medicine review. Work with your health care provider to regularly review all of the medicines you are taking, including over-the-counter medicines. Ask your health care provider about any side effects that may make you more likely to have a fall. Tell your health care provider if any medicines that you take make you feel dizzy or sleepy. Strength and balance checks. Your health care provider may recommend certain tests to check your strength and balance while  standing, walking, or changing positions. Foot health exam. Foot pain and numbness, as well as not wearing proper footwear, can make you more likely to have a fall. Screenings, including: Osteoporosis screening. Osteoporosis is a condition that causes the bones to get weaker and break more easily. Blood pressure screening. Blood pressure changes and medicines to control blood pressure can make you feel dizzy. Depression screening. You may be more likely to have a fall if you have a fear of falling, feel depressed, or feel unable to do activities that you used to do. Alcohol use screening. Using too much alcohol can affect your balance and may make you more likely to have a fall. Follow these instructions at home: Lifestyle Do not drink alcohol if: Your health care provider tells you not to drink. If you drink alcohol: Limit how much you have to: 0-1 drink a day for women. 0-2 drinks a day for men. Know how much alcohol is in your drink. In the [place] one drink equals one 12 oz bottle of beer (355 mL), one 5 oz glass of wine (148 mL), or one 1 oz glass of hard liquor (44 mL). Do not use any products that contain nicotine or tobacco. These products include cigarettes, chewing tobacco, and vaping devices, such as e-cigarettes. If you need help quitting, ask your health care provider. Activity  Follow a regular exercise program to stay fit. This will help you maintain your balance. Ask your health care provider what types of exercise are appropriate for you. If you need a cane or walker,  use it as recommended by your health care provider. Wear supportive shoes that have nonskid soles. Safety  Remove any tripping hazards, such as rugs, cords, and clutter. Install safety equipment such as grab bars in bathrooms and safety rails on stairs. Keep rooms and walkways well-lit. General instructions Talk with your health care provider about your risks for falling. Tell your health care provider  if: You fall. Be sure to tell your health care provider about all falls, even ones that seem minor. You feel dizzy, tiredness (fatigue), or off-balance. Take over-the-counter and prescription medicines only as told by your health care provider. These include supplements. Eat a healthy diet and maintain a healthy weight. A healthy diet includes low-fat dairy products, low-fat (lean) meats, and fiber from whole grains, beans, and lots of fruits and vegetables. Stay current with your vaccines. Schedule regular health, dental, and eye exams. Summary Having a healthy lifestyle and getting preventive care can help to protect your health and wellness after age 52. Screening and testing are the best way to find a health problem early and help you avoid having a fall. Early diagnosis and treatment give you the best chance for managing medical conditions that are more common for people who are older than age 70. Falls are a major cause of broken bones and head injuries in people who are older than age 61. Take precautions to prevent a fall at home. Work with your health care provider to learn what changes you can make to improve your health and wellness and to prevent falls. This information is not intended to replace advice given to you by your health care provider. Make sure you discuss any questions you have with your health care provider. Document Revised: 04/04/2021 Document Reviewed: 04/04/2021 Elsevier Patient Education  [place].

## 2021-11-02 NOTE — Assessment & Plan Note (Signed)
Stable period on sertraline 100mg  daily - continue.

## 2021-11-02 NOTE — Assessment & Plan Note (Signed)
Continue monthly B12 shots.  

## 2021-11-02 NOTE — Assessment & Plan Note (Signed)
Continue fludrocortisone 0.1mg daily.  

## 2021-11-02 NOTE — Assessment & Plan Note (Addendum)
S/p laminectomy and foraminotomy early 2021 Vertell Limber) and more recent ESI (yesterday). Discussing possible rpt surgery.

## 2021-11-02 NOTE — Assessment & Plan Note (Signed)
Chronic, stable off meds. Update FLP. The 10-year ASCVD risk score (Arnett DK, et al., 2019) is: 14.1%   Values used to calculate the score:     Age: 75 years     Sex: Female     Is Non-Hispanic African American: No     Diabetic: No     Tobacco smoker: No     Systolic Blood Pressure: 039 mmHg     Is BP treated: No     HDL Cholesterol: 53.5 mg/dL     Total Cholesterol: 193 mg/dL

## 2021-11-02 NOTE — Addendum Note (Signed)
Addended by: Brenton Grills on: 11/04/474 33:91 AM   Modules accepted: Orders

## 2021-11-02 NOTE — Assessment & Plan Note (Signed)
Continues clonazepam.

## 2021-11-02 NOTE — Telephone Encounter (Signed)
Seen for OV today.

## 2021-11-02 NOTE — Assessment & Plan Note (Signed)
Continue Q62mo prolia, last injection completed 08/30/2021.   Update DEXA when she gets mammo next month.

## 2021-11-16 ENCOUNTER — Other Ambulatory Visit: Payer: Self-pay | Admitting: Neurology

## 2021-11-22 ENCOUNTER — Telehealth: Payer: Self-pay | Admitting: Family Medicine

## 2021-11-22 NOTE — Telephone Encounter (Signed)
Please call - received message from [place] pharmacist that patient wanted to do b12 injections at home - if interested in this, would have her schedule nurse visit with son or whoever is going to inject med for B12 injection teaching and please send in B12 to pharmacy with appropriate syringe/needles. Last b12 shot was 11/02/2021.   Also received recommendation to change sertraline to venlafaxine as it may have some pain modulating effect. Let me know if interested and we can do cross taper over 2 weeks off sertraline onto venlafaxine.

## 2021-11-23 ENCOUNTER — Encounter: Payer: Self-pay | Admitting: Family Medicine

## 2021-11-23 NOTE — Telephone Encounter (Signed)
Name of Medication: Clonazepam Name of Pharmacy: [facility] Last Fill or Written Date and Quantity: 10/19/21, #45 Last Office Visit and Type: 11/02/21, AWV prt 2 Next Office Visit and Type: 05/03/22, 6 mo f/u Last Controlled Substance Agreement Date: 04/28/16 Last UDS: 04/28/16

## 2021-11-23 NOTE — Telephone Encounter (Signed)
Dr Darnell Level, since we do not have a RN, besides Leafy Ro, to do teaching, how should we proceed, send message to [facility] to see when/if she has availability?

## 2021-11-24 MED ORDER — CLONAZEPAM 0.5 MG PO TABS: ORAL_TABLET | ORAL | 0 refills | Status: DC

## 2021-11-25 ENCOUNTER — Ambulatory Visit (INDEPENDENT_AMBULATORY_CARE_PROVIDER_SITE_OTHER): Payer: Medicare Other | Admitting: Nurse Practitioner

## 2021-11-25 ENCOUNTER — Encounter: Payer: Self-pay | Admitting: Nurse Practitioner

## 2021-11-25 ENCOUNTER — Other Ambulatory Visit: Payer: Self-pay

## 2021-11-25 VITALS — BP 138/84 | HR 70 | Temp 97.8°F | Ht 65.5 in | Wt 161.0 lb

## 2021-11-25 DIAGNOSIS — R5383 Other fatigue: Secondary | ICD-10-CM

## 2021-11-25 DIAGNOSIS — J3489 Other specified disorders of nose and nasal sinuses: Secondary | ICD-10-CM | POA: Diagnosis not present

## 2021-11-25 DIAGNOSIS — R591 Generalized enlarged lymph nodes: Secondary | ICD-10-CM

## 2021-11-25 HISTORY — DX: Other fatigue: R53.83

## 2021-11-25 LAB — CBC WITH DIFFERENTIAL/PLATELET
Basophils Absolute: 0 10*3/uL (ref 0.0–0.1)
Basophils Relative: 0.7 % (ref 0.0–3.0)
Eosinophils Absolute: 0.1 10*3/uL (ref 0.0–0.7)
Eosinophils Relative: 1.7 % (ref 0.0–5.0)
HCT: 37.8 % (ref 36.0–46.0)
Hemoglobin: 12.1 g/dL (ref 12.0–15.0)
Lymphocytes Relative: 38.3 % (ref 12.0–46.0)
Lymphs Abs: 2 10*3/uL (ref 0.7–4.0)
MCHC: 32 g/dL (ref 30.0–36.0)
MCV: 86.1 fl (ref 78.0–100.0)
Monocytes Absolute: 0.3 10*3/uL (ref 0.1–1.0)
Monocytes Relative: 6.7 % (ref 3.0–12.0)
Neutro Abs: 2.7 10*3/uL (ref 1.4–7.7)
Neutrophils Relative %: 52.6 % (ref 43.0–77.0)
Platelets: 263 10*3/uL (ref 150.0–400.0)
RBC: 4.39 Mil/uL (ref 3.87–5.11)
RDW: 17.5 % — ABNORMAL HIGH (ref 11.5–15.5)
WBC: 5.1 10*3/uL (ref 4.0–10.5)

## 2021-11-25 LAB — POC COVID19 BINAXNOW: SARS Coronavirus 2 Ag: NEGATIVE

## 2021-11-25 NOTE — Assessment & Plan Note (Addendum)
Likely related to upper respiratory infection.  COVID-negative in office in office.

## 2021-11-25 NOTE — Assessment & Plan Note (Signed)
Likely viral in nature did give return precautions and continues an over-the-counter regimens for symptom relief

## 2021-11-25 NOTE — Progress Notes (Signed)
Acute Office Visit  Subjective:    Patient ID: [name], female    DOB: Oct 13, 1946, 75 y.o.   MRN: [id number]  Chief Complaint  Patient presents with   Swollen Glands    Here with son, Dellis Filbert. Pt is having swelling in her throat and nasal/ sinus passages. Noticed about 3 days ago. No recent illnesses.    HPI Patient is in today for swollen glands  Noticed it Tuesday night 11/22/2021 On pain medcine and not sure if they hurt or tender. No trouble swallowing or breathing Has been around sick folks that had "sinus suff". No history thyroid trouble Non smoker no personal history of cancer. Does have strong FH of cancer, different types one being esophogeal cancer. Past Medical History:  Diagnosis Date   Allergy    ANEMIA-NOS 09/25/2007   Anxiety    Arthritis    B12 DEFICIENCY 05/03/2007   Blood transfusion without reported diagnosis    CAD (coronary artery disease) 01/2010   MI, Nishan   Cardiomyopathy ([place]) 02/08/2010   H/o this 2012 after urosepsis, no recurrence.    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   OSTEOPOROSIS 08/2009   bisphosphonate on hold 2/2 dysphagia, on reclast done in August each year   Parkinson's disease ([place]) 08/25/2015   Dx Dr [name] 07/2015    PONV (postoperative nausea and vomiting)    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   Takotsubo cardiomyopathy 2008   due to E coli urosepsis    Past Surgical History:   Procedure Laterality Date   ABDOMINAL HYSTERECTOMY  1970s   IUD infection - first partial then with oophorectomy (cysts), complication - low blood pressure   CATARACT EXTRACTION Bilateral    CHOLECYSTECTOMY     complication - low blood pressure   COLONOSCOPY  06/2008   h/o polyps but latest WNL, rec rpt 10 yrs Olevia Perches)   COLONOSCOPY  11/2018   multiple TAs (10 polyps total), rpt 2 yrs Fuller Plan)   COLONOSCOPY  04/2021   multiple TAs, rpt 3 yrs Fuller Plan)   CYSTOSCOPY  12/2013   abx treatment for recurrent cystitis   DEXA  04/2013   T -2.9 @ femur, -1.6 @ spine   DEXA  04/2017   T -2.9 hip, -0.7 spine   ESOPHAGOGASTRODUODENOSCOPY  12/2017   WNL, regardless esophagus dilated, small HH Fuller Plan)   EYE SURGERY     LUMBAR LAMINECTOMY/DECOMPRESSION MICRODISCECTOMY Right 11/27/2019   Right Lumbar Four-Five foraminotomy;  Erline Levine, MD)   MINOR PLACEMENT OF FIDUCIAL N/A 06/30/2021   Procedure: MINOR PLACEMENT OF FIDUCIAL;  Surgeon: Erline Levine, MD;  Location: [place];  Service: Neurosurgery;  Laterality: N/A;  Minor room   PTOSIS REPAIR Bilateral 10/2020   Plastic Surgery   PULSE GENERATOR IMPLANT Right 07/14/2021   Procedure: UNILATERAL PULSE GENERATOR IMPLANT;  Surgeon: Erline Levine, MD;  Location: [place];  Service: Neurosurgery;  Laterality: Right;   SUBTHALAMIC STIMULATOR INSERTION Bilateral 07/07/2021   Procedure: Deep brain stimulator placement;  Surgeon: Erline Levine, MD;  Location: [place];  Service: Neurosurgery;  Laterality: Bilateral;   TUBAL LIGATION     UPPER GASTROINTESTINAL ENDOSCOPY      Family History  Problem Relation Age of Onset   Heart attack Father    Diabetes Father    Prostate cancer Father    Esophageal cancer Mother    Lung cancer Brother    Breast cancer Sister    Ovarian cancer Sister    Lung cancer Brother    CAD Brother    Uterine cancer Sister    Clotting disorder Son    Healthy Son    Healthy Daughter    Healthy Daughter    Colon cancer Neg Hx     Rectal cancer Neg Hx    Stomach cancer Neg Hx     Social History   Socioeconomic History   Marital status: Widowed    Spouse name: Not on file   Number of children: 4   Years of education: Not on file   Highest education level: 10th grade  Occupational History   Occupation: retired  Tobacco Use   Smoking status: Never   Smokeless tobacco: Never  Vaping Use   Vaping Use: Never used  Substance and Sexual Activity   Alcohol use: No   Drug use: Not Currently    Types: Nitrous oxide   Sexual activity: Never  Other Topics Concern   Not on file  Social History Narrative   Widow - husband [name]) passed away 2013-03-01.     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   Activity: limited by back pain   Right Handed   Social Determinants of Health   Financial Resource Strain: Low Risk    Difficulty of Paying Living Expenses: Not hard at all  Food Insecurity: No Food Insecurity   Worried About Charity fundraiser in the Last Year: Never true   Arboriculturist in the Last Year: Never true  Transportation Needs: No Transportation Needs   Lack of Transportation (Medical): No   Lack of Transportation (Non-Medical): No  Physical Activity: Inactive   Days of Exercise per Week: 0 days   Minutes of Exercise per Session: 0 min  Stress: No Stress Concern Present   Feeling of Stress : Not at all  Social Connections: Moderately Integrated   Frequency of Communication with Friends and Family: More than three times a week   Frequency of Social Gatherings with Friends and Family: Twice a week   Attends Religious Services: More than 4 times per year   Active Member of [employer] or Organizations: Yes   Attends Archivist Meetings: More than 4 times per year   Marital Status: Widowed  Human resources officer Violence: Not At Risk   Fear of Current or Ex-Partner: No    Emotionally Abused: No   Physically Abused: No   Sexually Abused: No    Outpatient Medications Prior to Visit  Medication Sig Dispense Refill   acetaminophen (TYLENOL) 650 MG CR tablet Take 2 tablets (1,300 mg total) by mouth every 8 (eight) hours as needed for pain.     AMBULATORY NON FORMULARY MEDICATION Lift chair Dx:  G20 1 Device 0   Calcium Carbonate-Vitamin D (CALCIUM 600/VITAMIN D) 600-400  MG-UNIT chew tablet Chew 1 tablet by mouth daily.     carbidopa-levodopa (SINEMET IR) 25-100 MG tablet TAKE 1 TABLETS BY MOUTH AT 6 AM ,0.5 TABLET AT 9:30 AM, 0.5 PM AND 4:30 PM. MAY TAKE AN EXTRA ONE-HALF TABLET BY MOUTH AS NEEDED IN THE EVENING (Patient taking differently: Take 0.5 tablets by mouth in the morning and at bedtime. 0.5  TABLET BID BY MOUTH AS NEEDED IN THE EVENING) 360 tablet 1   Cholecalciferol (VITAMIN D3) 25 MCG (1000 UT) CAPS Take 1 capsule (1,000 Units total) by mouth daily. 30 capsule    clonazePAM (KLONOPIN) 0.5 MG tablet Take 0.5 tablets (0.25 mg total) by mouth every morning AND 1 tablet (0.5 mg total) at bedtime. 45 tablet 0   clotrimazole-betamethasone (LOTRISONE) cream Apply 1 application topically daily. 30 g 0   cyanocobalamin (,VITAMIN B-12,) 1000 MCG/ML injection INJECT 1 ML (1,000 MCG TOTAL) INTO THE MUSCLE EVERY 30 (THIRTY) DAYS. 3 mL 2   denosumab (PROLIA) 60 MG/ML SOSY injection Inject 60 mg into the skin every 6 (six) months.     fludrocortisone (FLORINEF) 0.1 MG tablet Take 1 tablet (0.1 mg total) by mouth daily. 90 tablet 1   Glycerin-Hypromellose-PEG 400 (ARTIFICIAL TEARS) 0.2-0.2-1 % SOLN Place 1 drop into both eyes daily as needed (dry eyes).     latanoprost (XALATAN) 0.005 % ophthalmic solution Place 1 drop into both eyes at bedtime.     lubiprostone (AMITIZA) 24 MCG capsule Take 1 capsule (24 mcg total) by mouth 2 (two) times daily with a meal. 60 capsule 1   sertraline (ZOLOFT) 100 MG tablet TAKE 1 TABLET BY MOUTH  DAILY (Patient taking differently: Take by  mouth every morning.) 90 tablet 3   traMADol (ULTRAM) 50 MG tablet Take 1 tablet (50 mg total) by mouth every 6 (six) hours as needed for moderate pain. 30 tablet 0   mirabegron ER (MYRBETRIQ) 25 MG TB24 tablet Take 1 tablet (25 mg total) by mouth daily. (Patient not taking: Reported on 11/25/2021) 30 tablet 11   No facility-administered medications prior to visit.    Allergies  Allergen Reactions   Amitriptyline Other (See Comments)    Sedated next morning   Ciprofloxacin Nausea And Vomiting   Cymbalta [Duloxetine Hcl] Other (See Comments)    Worsened depression   Iohexol Other (See Comments)     Code: HIVES, Desc: PT developed 2 hives, followed by SOB, severe headache post 87cc's Omnipaque 300., Onset Date: [id number]    Lyrica [Pregabalin] Other (See Comments)    Tried during hospitalization - unsure effects but unable to tolerate   Imipramine Hcl Rash   Iodine Rash   Lidocaine Hcl Rash   Morphine Sulfate Rash   Neosporin [Neomycin-Bacitracin Zn-Polymyx] Rash and Other (See Comments)    Worsened skin breaking out   Sulfamethoxazole Rash   Tetracyclines & Related Rash    Review of Systems  Constitutional:  Positive for fatigue. Negative for chills and fever.  HENT:  Positive for rhinorrhea, sinus pressure and sinus pain. Negative for congestion, ear discharge, ear pain and sore throat.   Respiratory:  Negative for cough and shortness of breath.   Cardiovascular:  Negative for chest pain.  Gastrointestinal:  Negative for abdominal pain, diarrhea, nausea and vomiting.  Genitourinary:  Negative for dysuria, frequency and hematuria.  Musculoskeletal:  Negative for arthralgias and myalgias.       No new joint or muscle pain   Neurological:  Positive for headaches (from surgery). Negative for dizziness  and light-headedness.      Objective:    Physical Exam Vitals and nursing note reviewed.  HENT:     Right Ear: Tympanic membrane, ear canal and external ear normal. There is no  impacted cerumen.     Left Ear: Tympanic membrane, ear canal and external ear normal. There is no impacted cerumen.     Nose:     Right Sinus: Maxillary sinus tenderness and frontal sinus tenderness present.     Left Sinus: Maxillary sinus tenderness and frontal sinus tenderness present.     Mouth/Throat:     Mouth: Mucous membranes are moist.     Pharynx: No oropharyngeal exudate or posterior oropharyngeal erythema.     Comments: cobblestoning Neck:      Comments: Did not feel any infraclavicular nodes Cardiovascular:     Rate and Rhythm: Normal rate and regular rhythm.  Pulmonary:     Effort: Pulmonary effort is normal.     Breath sounds: Normal breath sounds.  Lymphadenopathy:     Cervical: Cervical adenopathy present.     Right cervical: No superficial, deep or posterior cervical adenopathy.    Left cervical: No superficial, deep or posterior cervical adenopathy.  Skin:    General: Skin is warm.  Neurological:     Mental Status: She is alert. Mental status is at baseline.  Psychiatric:        Mood and Affect: Mood normal.        Behavior: Behavior normal.        Thought Content: Thought content normal.        Judgment: Judgment normal.    BP 138/84 (BP Location: Left Arm, Patient Position: Sitting, Cuff Size: Normal)    Pulse 70    Temp 97.8 F (36.6 C)    Ht 5' 5.5" (1.664 m)    Wt 161 lb (73 kg)    SpO2 99%    BMI 26.38 kg/m  Wt Readings from Last 3 Encounters:  11/25/21 161 lb (73 kg)  11/02/21 160 lb 5 oz (72.7 kg)  10/31/21 156 lb 9.6 oz (71 kg)    Health Maintenance Due  Topic Date Due   Zoster Vaccines- Shingrix (1 of 2) Never done   COVID-19 Vaccine (3 - Booster for Pfizer series) 04/15/2020   TETANUS/TDAP  08/17/2021    There are no preventive care reminders to display for this patient.   Lab Results  Component Value Date   TSH 0.49 10/04/2018   Lab Results  Component Value Date   WBC 5.7 11/02/2021   HGB 12.0 11/02/2021   HCT 36.9 11/02/2021    MCV 84.9 11/02/2021   PLT 252.0 11/02/2021   Lab Results  Component Value Date   NA 138 11/02/2021   K 4.1 11/02/2021   CO2 30 11/02/2021   GLUCOSE 110 (H) 11/02/2021   BUN 14 11/02/2021   CREATININE 0.58 11/02/2021   BILITOT 0.3 11/02/2021   ALKPHOS 64 11/02/2021   AST 14 11/02/2021   ALT 5 11/02/2021   PROT 7.3 11/02/2021   ALBUMIN 4.2 11/02/2021   CALCIUM 9.5 11/02/2021   ANIONGAP 10 07/22/2021   GFR 89.82 11/02/2021   Lab Results  Component Value Date   CHOL 219 (H) 11/02/2021   Lab Results  Component Value Date   HDL 68.90 11/02/2021   Lab Results  Component Value Date   LDLCALC 130 (H) 11/02/2021   Lab Results  Component Value Date   TRIG 96.0 11/02/2021   Lab Results  Component Value Date   CHOLHDL 3 11/02/2021   Lab Results  Component Value Date   HGBA1C 5.6 05/13/2013       Assessment & Plan:   Problem List Items Addressed This Visit       Immune and Lymphatic   Lymphadenopathy    Likely reactive to viral illness.  Check CBC with differential.  Continue to monitor      Relevant Orders   CBC with Differential/Platelet (Completed)     Other   Other fatigue - Primary    Likely related to upper respiratory infection.  COVID-negative in office in office.      Relevant Orders   POC COVID-19 (Completed)   Sinus pain    Likely viral in nature did give return precautions and continues an over-the-counter regimens for symptom relief      Relevant Orders   POC COVID-19 (Completed)     No orders of the defined types were placed in this encounter.  This visit occurred during the SARS-CoV-2 public health emergency.  Safety protocols were in place, including screening questions prior to the visit, additional usage of staff PPE, and extensive cleaning of exam room while observing appropriate contact time as indicated for disinfecting solutions.   Romilda Garret, NP

## 2021-11-25 NOTE — Patient Instructions (Signed)
Nice to see you today Your covid test was negative in office You can get an over the counter antihistamine to help with some of the symptoms. There are several like Claritin, zyrtec, xyzal or allegra  If you do not start improving over the next 4-5 days reach back out to me and we can consider using an antibiotic at that point.

## 2021-11-25 NOTE — Assessment & Plan Note (Signed)
Likely reactive to viral illness.  Check CBC with differential.  Continue to monitor

## 2021-11-30 ENCOUNTER — Other Ambulatory Visit (HOSPITAL_COMMUNITY): Payer: Self-pay | Admitting: Neurosurgery

## 2021-11-30 DIAGNOSIS — R29898 Other symptoms and signs involving the musculoskeletal system: Secondary | ICD-10-CM | POA: Diagnosis not present

## 2021-12-08 ENCOUNTER — Other Ambulatory Visit: Payer: Self-pay

## 2021-12-08 ENCOUNTER — Ambulatory Visit (HOSPITAL_COMMUNITY)
Admission: RE | Admit: 2021-12-08 | Discharge: 2021-12-08 | Disposition: A | Discharge status: Home / Self Care | Payer: Medicare Other | Source: Ambulatory Visit | Attending: Neurosurgery | Admitting: Neurosurgery

## 2021-12-08 DIAGNOSIS — M2578 Osteophyte, vertebrae: Secondary | ICD-10-CM | POA: Diagnosis not present

## 2021-12-08 DIAGNOSIS — M542 Cervicalgia: Secondary | ICD-10-CM | POA: Diagnosis not present

## 2021-12-08 DIAGNOSIS — R29898 Other symptoms and signs involving the musculoskeletal system: Secondary | ICD-10-CM | POA: Insufficient documentation

## 2021-12-08 DIAGNOSIS — M4802 Spinal stenosis, cervical region: Secondary | ICD-10-CM | POA: Diagnosis not present

## 2021-12-08 DIAGNOSIS — M47812 Spondylosis without myelopathy or radiculopathy, cervical region: Secondary | ICD-10-CM | POA: Diagnosis not present

## 2021-12-08 DIAGNOSIS — M4312 Spondylolisthesis, cervical region: Secondary | ICD-10-CM | POA: Diagnosis not present

## 2021-12-14 ENCOUNTER — Encounter: Payer: Self-pay | Admitting: Family Medicine

## 2021-12-14 DIAGNOSIS — E538 Deficiency of other specified B group vitamins: Secondary | ICD-10-CM

## 2021-12-14 NOTE — Telephone Encounter (Signed)
Dr. Darnell Level, are you ok with pt doing her own mthly vit B12 inj.  Last inj- 11/02/21.

## 2021-12-16 DIAGNOSIS — M48061 Spinal stenosis, lumbar region without neurogenic claudication: Secondary | ICD-10-CM | POA: Diagnosis not present

## 2021-12-18 ENCOUNTER — Encounter: Payer: Self-pay | Admitting: Family Medicine

## 2021-12-19 MED ORDER — CYANOCOBALAMIN 1000 MCG/ML IJ SOLN: 1000.0000 ug | INTRAMUSCULAR | 2 refills | Status: DC

## 2021-12-19 MED ORDER — "BD SAFETYGLIDE SYRINGE/NEEDLE 25G X 1"" 3 ML MISC": 0 refills | Status: DC

## 2021-12-19 MED ORDER — TRAMADOL HCL 50 MG PO TABS: 50.0000 mg | ORAL_TABLET | Freq: Four times a day (QID) | ORAL | 0 refills | Status: DC | PRN

## 2021-12-19 NOTE — Telephone Encounter (Signed)
ERx 

## 2021-12-19 NOTE — Telephone Encounter (Signed)
Name of Medication: Tramadol Name of Pharmacy: [facility] Last [name] or Written Date and Quantity: 07/08/21, #30 Last Office Visit and Type: 11/02/21, AWV prt 2 Next Office Visit and Type: 05/03/22, 6 mo f/u Last Controlled Substance Agreement Date: 04/28/16 Last UDS: 04/28/16

## 2021-12-19 NOTE — Telephone Encounter (Signed)
Spoke with pt asking if she needs needles/syringes.  Pt confirms she does.   E-scribed needle/syringes.

## 2021-12-19 NOTE — Telephone Encounter (Signed)
Ok to do at home. plz send syringes/needles if needed

## 2021-12-19 NOTE — Addendum Note (Signed)
Addended by: Brenton Grills on: 2/70/7867 54:49 AM   Modules accepted: Orders

## 2021-12-23 ENCOUNTER — Encounter: Payer: Self-pay | Admitting: Family Medicine

## 2021-12-23 ENCOUNTER — Other Ambulatory Visit: Payer: Self-pay

## 2021-12-23 ENCOUNTER — Other Ambulatory Visit: Payer: Self-pay | Admitting: Family Medicine

## 2021-12-23 ENCOUNTER — Ambulatory Visit (INDEPENDENT_AMBULATORY_CARE_PROVIDER_SITE_OTHER): Payer: Medicare Other | Admitting: Family Medicine

## 2021-12-23 DIAGNOSIS — R222 Localized swelling, mass and lump, trunk: Secondary | ICD-10-CM

## 2021-12-23 DIAGNOSIS — Z780 Asymptomatic menopausal state: Secondary | ICD-10-CM

## 2021-12-23 HISTORY — DX: Localized swelling, mass and lump, trunk: R22.2

## 2021-12-23 NOTE — Progress Notes (Signed)
Patient ID: [name], female    DOB: 12/20/1945, 76 y.o.   MRN: [id number]  This visit was conducted in person.  BP 114/62    Pulse 75    Temp 97.6 F (36.4 C) (Temporal)    Ht 5' 5.5" (1.664 m)    Wt 163 lb 4 oz (74 kg)    SpO2 96%    BMI 26.75 kg/m    CC: neck and shoulder swelling  Subjective:   HPI: [name] is a 76 y.o. female presenting on 12/23/2021 for Edema (C/o neck and shoulder swelling.  Noticed about 1 mo ago.  Told at a previous OV to return if not improved. )   1 mo h/o swelling above bilateral clavicles. No pain or tenderness, no redness or warmth. She does note intermittent spasms to neck.  Doesn't feel well - ongoing lower back pain, fatigue.  Sees Dr Dawley - pending steroid injection into back and if no benefit, may recommend surgery.   Seen last month for same, CBC at that time normal, thought likely reactive transient viral lymphadenopathy.   Mammo 11/2020 - Birads1 @ [facility]  2 sisters with atrial fibrillation and kidney disease.      Relevant past medical, surgical, family and social history reviewed and updated as indicated. Interim medical history since our last visit reviewed. Allergies and medications reviewed and updated. Outpatient Medications Prior to Visit  Medication Sig Dispense Refill   acetaminophen (TYLENOL) 650 MG CR tablet Take 2 tablets (1,300 mg total) by mouth every 8 (eight) hours as needed for pain.     AMBULATORY NON FORMULARY MEDICATION Lift chair Dx:  G20 1 Device 0   Calcium Carbonate-Vitamin D (CALCIUM 600/VITAMIN D) 600-400 MG-UNIT chew tablet Chew 1 tablet by mouth daily.     carbidopa-levodopa (SINEMET IR) 25-100 MG tablet TAKE 1 TABLETS BY MOUTH AT 6 AM ,0.5 TABLET AT 9:30 AM, 0.5 PM AND 4:30 PM. MAY TAKE AN EXTRA ONE-HALF TABLET BY MOUTH AS NEEDED IN THE EVENING (Patient taking differently: Take 0.5 tablets by mouth in the morning and at bedtime. 0.5  TABLET BID BY MOUTH AS NEEDED IN THE EVENING) 360  tablet 1   Cholecalciferol (VITAMIN D3) 25 MCG (1000 UT) CAPS Take 1 capsule (1,000 Units total) by mouth daily. 30 capsule    clonazePAM (KLONOPIN) 0.5 MG tablet Take 0.5 tablets (0.25 mg total) by mouth every morning AND 1 tablet (0.5 mg total) at bedtime. 45 tablet 0   clotrimazole-betamethasone (LOTRISONE) cream Apply 1 application topically daily. 30 g 0   cyanocobalamin (,VITAMIN B-12,) 1000 MCG/ML injection Inject 1 mL (1,000 mcg total) into the muscle every 30 (thirty) days. 3 mL 2   denosumab (PROLIA) 60 MG/ML SOSY injection Inject 60 mg into the skin every 6 (six) months.     fludrocortisone (FLORINEF) 0.1 MG tablet Take 1 tablet (0.1 mg total) by mouth daily. 90 tablet 1   Glycerin-Hypromellose-PEG 400 (ARTIFICIAL TEARS) 0.2-0.2-1 % SOLN Place 1 drop into both eyes daily as needed (dry eyes).     latanoprost (XALATAN) 0.005 % ophthalmic solution Place 1 drop into both eyes at bedtime.     lubiprostone (AMITIZA) 24 MCG capsule Take 1 capsule (24 mcg total) by mouth 2 (two) times daily with a meal. 60 capsule 1   sertraline (ZOLOFT) 100 MG tablet TAKE 1 TABLET BY MOUTH  DAILY (Patient taking differently: Take by mouth every morning.) 90 tablet 3   SYRINGE-NEEDLE, DISP,  3 ML (BD SAFETYGLIDE SYRINGE/NEEDLE) 25G X 1" 3 ML MISC Use to inject vitamin B12 monthly. 50 each 0   traMADol (ULTRAM) 50 MG tablet Take 1 tablet (50 mg total) by mouth every 6 (six) hours as needed for moderate pain. 30 tablet 0   mirabegron ER (MYRBETRIQ) 25 MG TB24 tablet Take 1 tablet (25 mg total) by mouth daily. (Patient not taking: Reported on 11/25/2021) 30 tablet 11   No facility-administered medications prior to visit.     Per HPI unless specifically indicated in ROS section below Review of Systems  Objective:  BP 114/62    Pulse 75    Temp 97.6 F (36.4 C) (Temporal)    Ht 5' 5.5" (1.664 m)    Wt 163 lb 4 oz (74 kg)    SpO2 96%    BMI 26.75 kg/m   Wt Readings from Last 3 Encounters:  12/23/21 163 lb 4 oz  (74 kg)  11/25/21 161 lb (73 kg)  11/02/21 160 lb 5 oz (72.7 kg)      Physical Exam Vitals and nursing note reviewed.  Constitutional:      Appearance: Normal appearance. She is not ill-appearing.  Cardiovascular:     Rate and Rhythm: Normal rate and regular rhythm.     Pulses: Normal pulses.     Heart sounds: Normal heart sounds. No murmur heard. Pulmonary:     Effort: Pulmonary effort is normal. No respiratory distress.     Breath sounds: Normal breath sounds. No wheezing, rhonchi or rales.     Comments: Crackles RLL Chest:     Chest wall: Tenderness present.       Comments:  Square - DBS battery in place with wire leading up neck Circles - area of tender swelling at sternoclavicular juncture bilaterally without erythema, warmth Musculoskeletal:     Cervical back: Rigidity present.     Right lower leg: No edema.     Left lower leg: No edema.  Lymphadenopathy:     Head:     Right side of head: No submental, submandibular, tonsillar or posterior auricular adenopathy.     Left side of head: No submental, submandibular, tonsillar, preauricular or posterior auricular adenopathy.     Cervical: No cervical adenopathy.     Upper Body:     Right upper body: No supraclavicular adenopathy.     Left upper body: No supraclavicular adenopathy.  Neurological:     Mental Status: She is alert.  Psychiatric:        Mood and Affect: Mood normal.        Behavior: Behavior normal.      Results for orders placed or performed in visit on 11/25/21  CBC with Differential/Platelet  Result Value Ref Range   WBC 5.1 4.0 - 10.5 K/uL   RBC 4.39 3.87 - 5.11 Mil/uL   Hemoglobin 12.1 12.0 - 15.0 g/dL   HCT 37.8 36.0 - 46.0 %   MCV 86.1 78.0 - 100.0 fl   MCHC 32.0 30.0 - 36.0 g/dL   RDW 17.5 (H) 11.5 - 15.5 %   Platelets 263.0 150.0 - 400.0 K/uL   Neutrophils Relative % 52.6 43.0 - 77.0 %   Lymphocytes Relative 38.3 12.0 - 46.0 %   Monocytes Relative 6.7 3.0 - 12.0 %   Eosinophils Relative  1.7 0.0 - 5.0 %   Basophils Relative 0.7 0.0 - 3.0 %   Neutro Abs 2.7 1.4 - 7.7 K/uL   Lymphs Abs 2.0 0.7 - 4.0  K/uL   Monocytes Absolute 0.3 0.1 - 1.0 K/uL   Eosinophils Absolute 0.1 0.0 - 0.7 K/uL   Basophils Absolute 0.0 0.0 - 0.1 K/uL  POC COVID-19  Result Value Ref Range   SARS Coronavirus 2 Ag Negative Negative   *Note: Due to a large number of results and/or encounters for the requested time period, some results have not been displayed. A complete set of results can be found in Results Review.   Lab Results  Component Value Date   CREATININE 0.58 11/02/2021   BUN 14 11/02/2021   NA 138 11/02/2021   K 4.1 11/02/2021   CL 102 11/02/2021   CO2 30 11/02/2021    Assessment & Plan:  This visit occurred during the SARS-CoV-2 public health emergency.  Safety protocols were in place, including screening questions prior to the visit, additional usage of staff PPE, and extensive cleaning of exam room while observing appropriate contact time as indicated for disinfecting solutions.   Problem List Items Addressed This Visit     Localized swelling of chest wall    Swelling seems centered at bilateral sternoclavicular joints, not affecting DBS battery site. rec supportive care - limited heating pad use, topical voltaren gel, gentle stretching of neck an chest wall. Update if not improving to consider xray. Pt agrees with plan.         No orders of the defined types were placed in this encounter.  No orders of the defined types were placed in this encounter.    Patient Instructions  Call and schedule mammogram and bone density scan.  It seems swelling is at the joint between your collarbones (clavicles) and chest bone (sternum).  May try topical voltaren gel pea sized amount to tender area 1-2 times a daily for 1-2 weeks.  Gentle stretching of neck and chest wall.  If no better, let me know for xrays.   Follow up plan: Return if symptoms worsen or fail to improve.  [name], MD

## 2021-12-23 NOTE — Assessment & Plan Note (Signed)
Swelling seems centered at bilateral sternoclavicular joints, not affecting DBS battery site. rec supportive care - limited heating pad use, topical voltaren gel, gentle stretching of neck an chest wall. Update if not improving to consider xray. Pt agrees with plan.

## 2021-12-23 NOTE — Patient Instructions (Addendum)
Call and schedule mammogram and bone density scan.  It seems swelling is at the joint between your collarbones (clavicles) and chest bone (sternum).  May try topical voltaren gel pea sized amount to tender area 1-2 times a daily for 1-2 weeks.  Gentle stretching of neck and chest wall.  If no better, let me know for xrays.

## 2021-12-23 NOTE — Telephone Encounter (Signed)
Clonazepam Last rx: 11/24/21, #45 Last OV:  today Next OV: 05/03/22, 6 mo f/u

## 2021-12-24 MED ORDER — CLONAZEPAM 0.5 MG PO TABS: ORAL_TABLET | ORAL | 0 refills | Status: DC

## 2021-12-24 NOTE — Telephone Encounter (Signed)
ERx 

## 2021-12-30 ENCOUNTER — Telehealth: Payer: Self-pay

## 2021-12-30 NOTE — Chronic Care Management (AMB) (Signed)
Chronic Care Management Pharmacy Assistant   Name: SHAKEVIA SARRIS  MRN: [id number] DOB: March 09, 1946   Reason for Encounter:General Adherence Disease State  Recent office visits:  12/23/21-PCP-Javier Gutierrez,MD-Patient presented for swelling in neck and shoulder.Advised to stretch, apply heat, use topical voltaren gel-apply 1-2 times a day for 1-2 weeks. 11/25/21-Family Medicine-James Cable,NP-Patient presented for swollen glands.Labs ordered(red and white clood cells look good)-negative covid test- You can get an over the counter antihistamine to help with some of the symptoms. There are several like Claritin, zyrtec, xyzal or allegra. 11/02/21-PCP-Javier Gutierrez,MD-Patient presented for AWV. Labs ordered(Your vitamin D levels returned normal as did your kidneys, liver, and blood counts.Your cholesterol levels were mildly elevated - increase fiber and legumes in the diet to help lower LDL levels.) Discussed screenings, vaccines,Miralax intolerance. linzess unaffordable. Will price out amitiza. Flu shot given,B12-shot given  Recent consult visits:  11/30/21-[facility]-no data found 11/01/21-[facility]-no data found 10/31/21-Neurology-Rebecca [name],DO-Patient presented for DBS programming-No medication changes 10/28/21-[facility]-Matthew Trifan,MD-Patient presented for a fall. Xrays,lab ordered,given tylenol 650 mg,no admission 10/10/21-[facility]-no data found. 09/14/21-[facility]-no data found   Hospital visits:  None in previous 6 months  Medications: Outpatient Encounter Medications as of 12/30/2021  Medication Sig Note   acetaminophen (TYLENOL) 650 MG CR tablet Take 2 tablets (1,300 mg total) by mouth every 8 (eight) hours as needed for pain.    AMBULATORY NON FORMULARY MEDICATION Lift chair Dx:  G20    Calcium Carbonate-Vitamin D (CALCIUM 600/VITAMIN D) 600-400 MG-UNIT chew tablet Chew 1 tablet by mouth daily.     carbidopa-levodopa (SINEMET IR) 25-100 MG tablet TAKE 1 TABLETS BY MOUTH AT 6 AM ,0.5 TABLET AT 9:30 AM, 0.5 PM AND 4:30 PM. MAY TAKE AN EXTRA ONE-HALF TABLET BY MOUTH AS NEEDED IN THE EVENING (Patient taking differently: Take 0.5 tablets by mouth in the morning and at bedtime. 0.5  TABLET BID BY MOUTH AS NEEDED IN THE EVENING)    Cholecalciferol (VITAMIN D3) 25 MCG (1000 UT) CAPS Take 1 capsule (1,000 Units total) by mouth daily.    clonazePAM (KLONOPIN) 0.5 MG tablet Take 0.5 tablets (0.25 mg total) by mouth every morning AND 1 tablet (0.5 mg total) at bedtime.    clotrimazole-betamethasone (LOTRISONE) cream Apply 1 application topically daily.    cyanocobalamin (,VITAMIN B-12,) 1000 MCG/ML injection Inject 1 mL (1,000 mcg total) into the muscle every 30 (thirty) days.    denosumab (PROLIA) 60 MG/ML SOSY injection Inject 60 mg into the skin every 6 (six) months. 09/21/2021: Last injection 08/30/2021   fludrocortisone (FLORINEF) 0.1 MG tablet Take 1 tablet (0.1 mg total) by mouth daily.    Glycerin-Hypromellose-PEG 400 (ARTIFICIAL TEARS) 0.2-0.2-1 % SOLN Place 1 drop into both eyes daily as needed (dry eyes).    latanoprost (XALATAN) 0.005 % ophthalmic solution Place 1 drop into both eyes at bedtime.    lubiprostone (AMITIZA) 24 MCG capsule Take 1 capsule (24 mcg total) by mouth 2 (two) times daily with a meal.    sertraline (ZOLOFT) 100 MG tablet TAKE 1 TABLET BY MOUTH  DAILY (Patient taking differently: Take by mouth every morning.)    SYRINGE-NEEDLE, DISP, 3 ML (BD SAFETYGLIDE SYRINGE/NEEDLE) 25G X 1" 3 ML MISC Use to inject vitamin B12 monthly.    traMADol (ULTRAM) 50 MG tablet Take 1 tablet (50 mg total) by mouth every 6 (six) hours as needed for moderate pain.    No facility-administered encounter medications on file as of 12/30/2021.  Contacted JALILA GOODNOUGH on 01/03/22 for general disease state and medication adherence call.   Patient is not more than 5 days past due for refill on the  following medications per chart history:  Star Medications: Medication Name/mg Last Fill Days Supply  No star medications identified    What concerns do you have about your medications? Patient reports no concerns  The patient denies side effects with their medications.   How often do you forget or accidentally miss a dose? Never  Do you use a pillbox? Yes The patient reports she puts out a Designer, jewellery every week  Are you having any problems getting your medications from your pharmacy? No  The patient has no complaints with [facility].  Has the cost of your medications been a concern? No  Since last visit with CPP, no interventions have been made.   The patient has had an ED visit since last contact.   The patient denies problems with their health.   Patient denies concerns or questions for [name] PharmD  at this time.   Counseled patient on:  Importance of taking medication daily without missed doses, Benefits of adherence packaging or a pillbox, and Access to CCM team for any cost, medication or pharmacy concerns.   Care Gaps: Annual wellness visit in last year? Yes Most Recent BP reading:114/62  75-P  12/23/21   Upcoming appointments: CCM appointment on 03/22/22  Marjo Bicker  CPP notified  [name], [place] Assistant [phone]  Total time spent for month CPA: 30 min.s

## 2022-01-12 ENCOUNTER — Other Ambulatory Visit: Payer: Self-pay

## 2022-01-12 NOTE — Telephone Encounter (Signed)
Received refill request from [facility] for Amitiza. This was started on 11/02/21 and RX sent for #60 with 1 refill. LOV 12/23/21-for localized swelling.  Next appointment on 05/03/22. Ok to refill until then?

## 2022-01-13 ENCOUNTER — Other Ambulatory Visit: Payer: Self-pay | Admitting: Family Medicine

## 2022-01-13 NOTE — Telephone Encounter (Signed)
Florinef Last filled:  11/12/21, #90 Last OV:  12/23/21, swelling of chest Next OV:  05/03/22, 6 mo f/u

## 2022-01-17 DIAGNOSIS — M5416 Radiculopathy, lumbar region: Secondary | ICD-10-CM | POA: Diagnosis not present

## 2022-01-18 MED ORDER — LUBIPROSTONE 24 MCG PO CAPS: 24.0000 ug | ORAL_CAPSULE | Freq: Two times a day (BID) | ORAL | 6 refills | Status: DC

## 2022-01-18 NOTE — Telephone Encounter (Signed)
ERx 

## 2022-01-19 ENCOUNTER — Other Ambulatory Visit: Payer: Self-pay | Admitting: Family Medicine

## 2022-01-20 NOTE — Telephone Encounter (Signed)
Name of Medication: Tramadol Name of Pharmacy: [facility] [address] Last Venida Jarvis or Written Date and Quantity: 12/19/21, #30 Last Office Visit and Type: 12/23/21, localized swelling of chest wall Next Office Visit and Type: 05/03/22, 6 mo f/u Last Controlled Substance Agreement Date: 04/28/16 Last UDS: 04/28/16

## 2022-01-22 ENCOUNTER — Other Ambulatory Visit: Payer: Self-pay | Admitting: Family Medicine

## 2022-01-22 ENCOUNTER — Encounter: Payer: Self-pay | Admitting: Family Medicine

## 2022-01-23 NOTE — Telephone Encounter (Signed)
ERx 

## 2022-01-23 NOTE — Telephone Encounter (Signed)
Clonazepam Last rx: 12-24-21 #45 Last OV:  12-23-21 Next OV: 05/03/22, 6 mo f/u [facility]

## 2022-01-24 NOTE — Telephone Encounter (Signed)
ERx 

## 2022-02-06 ENCOUNTER — Encounter: Payer: Self-pay | Admitting: Neurology

## 2022-02-07 ENCOUNTER — Telehealth: Payer: Self-pay | Admitting: Neurology

## 2022-02-07 NOTE — Telephone Encounter (Signed)
Patient called and stated that she is having trouble walking right.  Her feet are hurting and shes walking on her tip toes.  She wanted to know if she can take more of the carbidopa-levodopa, or if there is anything else she can do. ?

## 2022-02-07 NOTE — Telephone Encounter (Signed)
Called patient and she said this has been going on for at least a month were she feels her legs and feet are not cooperating with her. She is walking on her tip toes and she is only taking a 1/2 pill of carbidopa levodopa a day  ?

## 2022-02-08 ENCOUNTER — Other Ambulatory Visit: Payer: Self-pay

## 2022-02-08 NOTE — Telephone Encounter (Signed)
This was never routed to me. ?Is pt still interested in home B12 injections? She could receive teaching through her local pharmacy.  ? ?Is she still interested in changing antidepressant from sertraline to effexor or cymbalta?  ?

## 2022-02-08 NOTE — Telephone Encounter (Signed)
Lvm asking pt to call back. Need to get answer to Dr. Synthia Innocent questions and relay message.  ?

## 2022-02-08 NOTE — Telephone Encounter (Signed)
Patient has taken 1/2 and then took another 1/2 of carbidopa levodopa . Patient didn't know if you wanted her to take anymore than that  ?

## 2022-02-08 NOTE — Telephone Encounter (Signed)
Patient called back states that she has been getting at home from son for last 3 months with no issues.  ? ?If you thing the change in antidepressants is a good idea she is fine with doing so.  ?

## 2022-02-09 NOTE — Telephone Encounter (Signed)
Called Patient and she will increase Levodopa to 1 tablet bid she is going to her back doctor on Monday and feels that may have something to do with her legs as well  ?

## 2022-02-10 ENCOUNTER — Telehealth: Payer: Self-pay | Admitting: Family Medicine

## 2022-02-10 MED ORDER — VENLAFAXINE HCL ER 37.5 MG PO CP24: ORAL_CAPSULE | ORAL | 3 refills | Status: DC

## 2022-02-10 NOTE — Telephone Encounter (Signed)
Per Clarise Cruz, pt rtn call (see other 02/10/22 phn note).   ? ?Lvm asking pt to call back.  Need to relay Dr. Synthia Innocent message.  ?

## 2022-02-10 NOTE — Telephone Encounter (Signed)
Per Verdene Lennert, pt rtn call (see other 02/10/22 phn note).   ? ?Lvm asking pt to call back.  Need to relay Dr. Synthia Innocent message.  ?

## 2022-02-10 NOTE — Telephone Encounter (Addendum)
We can try effexor: ?Drop sertraline to 1/2 tablet daily for 1 week and at same time start venlafaxine 37.'5mg'$  daily. ?For 2nd week take sertraline 1/2 tab every other day and increase venlafaxine to 37.'5mg'$  2 tabs daily ('75mg'$ ).  ?At 3rd week, stop sertraline, continue venlafaxine '75mg'$  daily.  ?Now she should be aware venlafaxine is an antidepressant that is more difficult to taper off so if we every do want to come off this she needs to let me know for instructions.  ?I've sent venlafaxine to her local pharmacy.  ?

## 2022-02-10 NOTE — Telephone Encounter (Signed)
Spoke with pt relaying Dr. Synthia Innocent message.  Pt repeated instructions and verbalizes understanding.  ?

## 2022-02-10 NOTE — Telephone Encounter (Signed)
Patient is returning your call . Please advise CB# [phone]

## 2022-02-10 NOTE — Telephone Encounter (Signed)
Lvm asking pt to call back.  Need to relay Dr. G's message.  

## 2022-02-10 NOTE — Telephone Encounter (Signed)
Pt returning your call

## 2022-02-10 NOTE — Telephone Encounter (Addendum)
SEE OTHER 02/10/22 PHN NOTE.  DO NOT add any further notes to this message.  ? ?Plz document any other returned calls from pt today in other 02/10/22 phn note.  ?

## 2022-02-10 NOTE — Telephone Encounter (Signed)
See other 02/10/22 phn note. ?

## 2022-02-10 NOTE — Addendum Note (Signed)
Addended by: [name] on: 02/10/2022 07:18 AM ? ? Modules accepted: Orders ? ?

## 2022-02-13 ENCOUNTER — Telehealth: Payer: Self-pay | Admitting: Pharmacist

## 2022-02-13 ENCOUNTER — Telehealth: Payer: Medicare Other

## 2022-02-13 DIAGNOSIS — H4010X1 Unspecified open-angle glaucoma, mild stage: Secondary | ICD-10-CM | POA: Diagnosis not present

## 2022-02-13 DIAGNOSIS — H401131 Primary open-angle glaucoma, bilateral, mild stage: Secondary | ICD-10-CM | POA: Diagnosis not present

## 2022-02-13 DIAGNOSIS — M48061 Spinal stenosis, lumbar region without neurogenic claudication: Secondary | ICD-10-CM | POA: Diagnosis not present

## 2022-02-13 DIAGNOSIS — M5416 Radiculopathy, lumbar region: Secondary | ICD-10-CM | POA: Diagnosis not present

## 2022-02-13 NOTE — Progress Notes (Deleted)
? ?Chronic Care Management ?Pharmacy Note ? ?02/13/2022 ?Name:  [name] MRN:  [id number] DOB:  09-24-1946 ? ?Summary: CCM F/U visit ?-Pt reports Miralax, docusate, fiber supplements do not work/hurt her stomach, she requests an Rx to help with chronic constipation ?-Pt is not taking carbidopa-levodopa anymore (option for 1/2 tab PRN, has not needed lately); she does report persistent fatigue even after reducing AM clonazepam dose ?-Pt is not checking BP at home, but has not had any low BP "spells" since taking fludrocortisone ? ?Recommendations/Changes made from today's visit: ?-Recommend Linzess 145 mcg daily (pt requests Rx sent to [facility]) ?-Advised pt to check BP a few times a week ? ?Plan: ?-[place] will call patient *** ?-Pharmacist follow up televisit scheduled for *** ? ? ? ?Subjective: ?[name] is an 76 y.o. year old female who is a primary patient of [name], MD.  The CCM team was consulted for assistance with disease management and care coordination needs.   ? ?Engaged with patient by telephone for follow up visit in response to provider referral for pharmacy case management and/or care coordination services.  ? ?Consent to Services:  ?The patient was given information about Chronic Care Management services, agreed to services, and gave verbal consent prior to initiation of services.  Please see initial visit note for detailed documentation.  ? ?Patient Care Team: ?[name], MD as PCP - General (Family Medicine) ?Domingo Pulse, MD (Urology) ?Ludwig Clarks, DO as Consulting Physician (Neurology) ?Ronnell Freshwater, MD as Referring Physician (Ophthalmology) ?Ludwig Clarks, DO as Consulting Physician (Neurology) ?Izick Gasbarro, Cleaster Corin, [facility] as Pharmacist (Pharmacist) ? ?Recent office visits: ?02/10/22 TE - change sertraline to venlafaxine for additional pain benefit. ? ?12/23/21-PCP-Javier Gutierrez,MD-Patient presented for swelling in neck and  shoulder.Advised to stretch, apply heat, use topical voltaren gel-apply 1-2 times a day for 1-2 weeks. ? ?11/25/21 NP Romilda Garret OV: Patient presented for swollen glands. Negative covid test. Likely viral illness. Try OTC antihistamine.  ? ?11/02/21-PCP-Javier Gutierrez,MD-Patient presented for AWV. Rx'd Myrbetriq 25 mg. Rx'd Amitiza 24 mcg (Linzess was unaffordable) ? ?07/29/21-PCP-Patient presented for 2 month follow up Parkinson's Disease. B-12 shot today,no medication changes follow up 3 months  ? ?05/27/21-PCP-Patient presented for follow up fall and rash. Restart Northera 178m TID,titrate to effect.Start Nystatin -triamcinolone ointment.Follow up 2-3 months ? ?04/27/21-PCP-Patient presented for follow up Parkinson's Disease. B-12 shot today,Northera became unaffordable. using walker regular now.follow up 6 months ? ?Recent consult visits: ?10/31/21-Neurology-Rebecca [name],DO-Patient presented for DBS programming-No medication changes ? ?09/05/21-Dr [name], Neurology-Patient presented for follow up Parkinson's Disease. "I would really like to see her backing down on the clonazepam to see if that helped with the daytime fatigue.  For now, I asked her to take just half a tablet of clonazepam in the morning and 1 tablet of the clonazepam at nighttime"; Referral for physical therapy and speech therapy  ? ?08/22/21-Dr [name], Neurology-Patient presented for follow up Parkinson's Disease. Discontinue pramipexole, take extra 1/2 levodopa as needed, currently on Florinef 0.136mtake 1 tablet daily (pt stopped Northera on her own -cost) ? ?08/08/21-Dr [name], Neurology-Patient presented for follow up Parkinson's Disease. DBS programming performed (s/p placement 07/07/21); Continue carbidopa/levodopa 50/200 1 at bedtime,decrease pramipexole 0.5 mg, half tablet 3 times per day for a week and then half tablet twice a day for a week and then half tablet once a day for a week and then discontinue pramipexole ? ?07/14/21 thru 07/15/21-[facility]-Patient presented for deep brain stimulator  placement-30 hour observation. IV- Ondansetron, Aprepitant and Dexamethasone ? ?07/07/21-07/08/21-[facility]-Patient presented for surgery for bilateral deep brain stimulator placement.No complications,post op CT ordered,observation and discharge to home with Tramadol 23m take 1 tablet every 6 hours as needed for pain.Stop taking cephalexin. ? ?06/30/21-[facility]-Patient presented for Minor placement of Fiducial. No medication changes. ? ?05/23/21-Plastic Surgery-Patient presented for foreign body eyelid, no data found. ?04/20/21-Plastic Surgery- no data found ?04/18/21-Neurology-Patient presented for follow up Parkinson's Disease. ?03/30/21-Neurosurgery-no data found  ? ?Hospital visits: ?10/28/21 ED visit - fall and chest pain. No fractures on X-ray, clinically may have bilateral nondisplaced rib fractures. Use tramadol and Tylenol for pain. ? ?[facility] Neurolog consulted, Reduced carbidopa-levodopa at discharge. ? ?05/16/21 thru 05/17/21-[facility]-Patient presented for fall after dizziness, lightheadedness. CBC,UA,EKG, Started on Rocephin in the ED, continue.She is on home Klonopin 0.5 mg twice daily which can increase fall risk, defer to neurology to taper off if indicated. Continue home Klonopin and Zoloft ? ? ?Objective: ? ?Lab Results  ?Component Value Date  ? CREATININE 0.58 11/02/2021  ? BUN 14 11/02/2021  ? GFR 89.82 11/02/2021  ? GFRNONAA >60 07/22/2021  ? GFRAA >60 11/24/2019  ? NA 138 11/02/2021  ? K 4.1 11/02/2021  ? CALCIUM 9.5 11/02/2021  ? CO2 30 11/02/2021  ? GLUCOSE 110 (H) 11/02/2021  ? ? ?Lab Results  ?Component Value Date/Time  ? HGBA1C 5.6 05/13/2013 08:57 AM  ? HGBA1C 5.8 02/11/2007 04:25 PM  ? GFR 89.82 11/02/2021 10:58 AM  ? GFR 66.47 02/08/2021 11:01 AM  ?  ?Last diabetic Eye exam: No results found for: HMDIABEYEEXA   ?Last diabetic Foot exam: No results found for: HMDIABFOOTEX  ? ?Lab Results  ?Component Value Date  ? CHOL 219 (H) 11/02/2021  ? HDL 68.90 11/02/2021  ? LDLCALC 130 (H) 11/02/2021  ? LDLDIRECT 105.5 07/20/2010  ? TRIG 96.0 11/02/2021  ? CHOLHDL 3 11/02/2021  ? ? ?Hepatic Function Latest Ref Rng & Units 11/02/2021 07/22/2021 07/21/2021  ?Total Protein 6.0 - 8.3 g/dL 7.3 7.0 6.5  ?Albumin 3.5 - 5.2 g/dL 4.2 3.8 3.9  ?AST 0 - 37 U/L _0 ?ALT 0 - 35 U/L 5 6 <5  ?Alk Phosphatase 39 - 117 U/L 64 70 65  ?Total Bilirubin 0.2 - 1.2 mg/dL 0.3 0.6 1.4(H)  ?Bilirubin, Direct 0.0 - 0.3 mg/dL - - -  ? ? ?Lab Results  ?Component Value Date/Time  ? TSH 0.49 10/04/2018 08:56 AM  ? TSH 1.30 11/09/2016 12:12 PM  ? ? ?CBC Latest Ref Rng & Units 11/25/2021 11/02/2021 07/22/2021  ?WBC 4.0 - 10.5 K/uL 5.1 5.7 12.3(H)  ?Hemoglobin 12.0 - 15.0 g/dL 12.1 12.0 11.9(L)  ?Hematocrit 36.0 - 46.0 % 37.8 36.9 38.9  ?Platelets 150.0 - 400.0 K/uL 263.0 252.0 399  ? ? ?Lab Results  ?Component Value Date/Time  ? VD25OH 37.89 11/02/2021 10:58 AM  ? VD25OH 32.80 10/25/2020 10:30 AM  ? ? ?Clinical ASCVD: Yes  ?The 10-year ASCVD risk score (Arnett DK, et al., 2019) is: 16.5% ?  Values used to calculate the score: ?    Age: [age]years ?    Sex: Female ?    Is Non-Hispanic African American: No ?    Diabetic: No ?    Tobacco smoker: No ?    Systolic Blood Pressure: 1579mmHg ?    Is BP treated: No ?    HDL Cholesterol: 68.9 mg/dL ?  Total Cholesterol: 219 mg/dL   ? ?Depression screen [facility] 2/9 10/28/2021 10/27/2020 10/13/2019  ?Decreased Interest 0 0 0  ?Down, Depressed, Hopeless 0 0 0  ?PHQ - 2 Score 0 0 0  ?Altered sleeping - 2 0  ?Tired, decreased energy - 1 0  ?Change in appetite - 2 0  ?Feeling bad or failure about yourself  - 1 0  ?Trouble concentrating - 1 0  ?Moving slowly or fidgety/restless - 2 0  ?Suicidal thoughts - 0 0  ?PHQ-9 Score - 9 0  ?Difficult doing work/chores - - Not difficult at all  ?Some recent data might be hidden  ?  ?GAD 7 :  Generalized Anxiety Score 10/27/2020  ?Nervous, Anxious, on Edge 1  ?Control/stop worrying 1  ?Worry too much - different things 1  ?Trouble relaxing 1  ?Restless 1  ?Easily annoyed or irritable 1  ?Afraid - awful might happen

## 2022-02-13 NOTE — Telephone Encounter (Signed)
?  Chronic Care Management  ? ?Outreach Note ? ?02/13/2022 ?Name: [name] MRN: [id number] DOB: December 12, 1945 ? ?Referred by: [name], MD ? ?Patient had a phone appointment scheduled with clinical pharmacist today. ? ?An unsuccessful telephone outreach was attempted today. The patient was referred to the pharmacist for assistance with medications, care management and care coordination.  ? ?Patient will NOT be penalized in any way for missing a CCM appointment. The no-show fee does not apply. ? ?If possible, a message was left to return call to: [phone] or to [facility]. ? ?[name], PharmD, BCACP ?Clinical Pharmacist ?[facility] ?[phone] ? ? ?

## 2022-02-13 NOTE — Telephone Encounter (Addendum)
Patient returned call. She has questions about venlafaxine, she read that is should be avoided in glaucoma. ? ?Per literature review, there are several case studies of venlafaxine-induced increased ocular pressure, more often occurring in narrow-angle glaucoma compaired to open-angle. Per chart review, patient is treated for mild open-angle glaucoma per optometry. ? ?The prescribing information for venlafaxine recommends using with caution in glaucoma with regular monitoring of IOP.  ? ?It would be reasonable to continue venlafaxine as long as patient continues regular follow up with her eye doctor. Routing to PCP for input. ?

## 2022-02-14 NOTE — Telephone Encounter (Signed)
Spoke with patient, relayed message that Effexor is reasonable to try as long as eye doctor is on board. Patient reports she is planning to see her eye doctor on Monday 3/27 and will discuss venlafaxine then.  ?

## 2022-02-14 NOTE — Telephone Encounter (Signed)
Patient has not started taking the medication, she is waiting to hear from Dr. Darnell Level or his assistant..... ? ?Please follow-up with the patient ?

## 2022-02-14 NOTE — Telephone Encounter (Addendum)
I think it is reasonable to try effexor as she has mild open angle glaucoma but also do recommend she check with her eye doctor to ensure they are ok with this transition.  ?

## 2022-02-14 NOTE — Telephone Encounter (Signed)
error 

## 2022-02-20 DIAGNOSIS — H401131 Primary open-angle glaucoma, bilateral, mild stage: Secondary | ICD-10-CM | POA: Diagnosis not present

## 2022-02-20 DIAGNOSIS — H524 Presbyopia: Secondary | ICD-10-CM | POA: Diagnosis not present

## 2022-02-20 DIAGNOSIS — H43393 Other vitreous opacities, bilateral: Secondary | ICD-10-CM | POA: Diagnosis not present

## 2022-02-20 DIAGNOSIS — H5202 Hypermetropia, left eye: Secondary | ICD-10-CM | POA: Diagnosis not present

## 2022-02-20 DIAGNOSIS — H52222 Regular astigmatism, left eye: Secondary | ICD-10-CM | POA: Diagnosis not present

## 2022-02-24 ENCOUNTER — Other Ambulatory Visit: Payer: Self-pay | Admitting: Family Medicine

## 2022-02-27 ENCOUNTER — Telehealth: Payer: Self-pay

## 2022-02-27 DIAGNOSIS — M810 Age-related osteoporosis without current pathological fracture: Secondary | ICD-10-CM

## 2022-02-27 MED ORDER — TRAMADOL HCL 50 MG PO TABS: 50.0000 mg | ORAL_TABLET | Freq: Four times a day (QID) | ORAL | 0 refills | Status: DC | PRN

## 2022-02-27 MED ORDER — CLONAZEPAM 0.5 MG PO TABS: ORAL_TABLET | ORAL | 0 refills | Status: DC

## 2022-02-27 NOTE — Telephone Encounter (Signed)
Refill request Tramadol LR 01/23/22 #30 ?Clonazepam LR 01/24/22 #45 ?Last office visit 12/23/21 ?Upcoming appointment 05/03/22 ?

## 2022-02-27 NOTE — Telephone Encounter (Signed)
ERx 

## 2022-02-27 NOTE — Telephone Encounter (Signed)
Benefits submitted-pending ?Next injection due 03/01/22 or after ?

## 2022-03-04 ENCOUNTER — Other Ambulatory Visit: Payer: Self-pay | Admitting: Family Medicine

## 2022-03-08 ENCOUNTER — Telehealth: Payer: Self-pay | Admitting: Family Medicine

## 2022-03-08 NOTE — Telephone Encounter (Signed)
Benefits received. OOP cost is $310. PA done on [facility] website and approved. ? ?Authorization Status ?Approved ?Authorization Number ?[id number]  ?Authorization Start Date ?03-08-2022 ?Authorization End Date ?03-09-2023 ? ?Left message for patient to call back to discuss.  ?

## 2022-03-08 NOTE — Telephone Encounter (Signed)
Pt called stating you called her, didn't see any notes. Please advise. ?

## 2022-03-09 NOTE — Telephone Encounter (Signed)
Called patient back, please see other phone note OM:[id number]. Left message again. ?

## 2022-03-09 NOTE — Telephone Encounter (Signed)
Patient advised. ?Lab scheduled for 03/13/22 and Nurse Visit for 03/17/22 ? ?Dr Darnell Level, patient has steroid spinal injection scheduled for 03/15/22, I wanted to make sure it was ok to get Prolia injection on the 4/21? ? ?Patient is going out of town for a week after 4/21 ?

## 2022-03-09 NOTE — Addendum Note (Signed)
Addended by: Kris Mouton on: 03/09/2022 04:31 PM ? ? Modules accepted: Orders ? ?

## 2022-03-10 NOTE — Telephone Encounter (Addendum)
I think ok to do as long as she's feeling well at the time of injection.  ?

## 2022-03-10 NOTE — Telephone Encounter (Signed)
Noted. Patient will keep as scheduled. ?

## 2022-03-13 ENCOUNTER — Other Ambulatory Visit (INDEPENDENT_AMBULATORY_CARE_PROVIDER_SITE_OTHER): Payer: Medicare Other

## 2022-03-13 DIAGNOSIS — M810 Age-related osteoporosis without current pathological fracture: Secondary | ICD-10-CM

## 2022-03-13 LAB — BASIC METABOLIC PANEL
BUN: 18 mg/dL (ref 6–23)
CO2: 29 mEq/L (ref 19–32)
Calcium: 9 mg/dL (ref 8.4–10.5)
Chloride: 105 mEq/L (ref 96–112)
Creatinine, Ser: 0.68 mg/dL (ref 0.40–1.20)
GFR: 86.22 mL/min (ref >60.00)
Glucose, Bld: 97 mg/dL (ref 70–99)
Potassium: 3.9 mEq/L (ref 3.5–5.1)
Sodium: 141 mEq/L (ref 135–145)

## 2022-03-14 ENCOUNTER — Telehealth: Payer: Self-pay | Admitting: Family Medicine

## 2022-03-14 MED ORDER — SERTRALINE HCL 100 MG PO TABS: 100.0000 mg | ORAL_TABLET | Freq: Every day | ORAL | 1 refills | Status: DC

## 2022-03-14 NOTE — Telephone Encounter (Signed)
Ok to switch back to sertraline.  ?Drop effexor down to 37.'5mg'$  XR once daily and at same time start sertraline '100mg'$  1/2 tablet daily for 1 week then stop effexor XR and increase sertraline back to '100mg'$  daily.  ?Sertraline sent back to her [employer].  ?

## 2022-03-14 NOTE — Telephone Encounter (Signed)
Pt called stating that Dr Darnell Level took her off medication sertraline (ZOLOFT) 100 MG tablet and put her on medication venlafaxine XR (EFFEXOR XR) 37.5 MG 24 hr capsule. Pt states that medication venlafaxine XR (EFFEXOR XR) 37.5 MG 24 hr capsule is making her nervous and cant sleep at night. Pt is asking if she can go back on medication sertraline (ZOLOFT) 100 MG tablet. Please advise. ?

## 2022-03-14 NOTE — Telephone Encounter (Signed)
Labs done on 03/13/22 ?CrCl is 86.08 mL/min ?Calcium 9.0 ? ?NV on 03/17/22 ?

## 2022-03-15 DIAGNOSIS — M5416 Radiculopathy, lumbar region: Secondary | ICD-10-CM | POA: Diagnosis not present

## 2022-03-15 NOTE — Telephone Encounter (Signed)
Spoke to pt. She stated she understood the plan. ?

## 2022-03-15 NOTE — Telephone Encounter (Signed)
Left a message on voicemail for patient to call the office back. 

## 2022-03-17 ENCOUNTER — Ambulatory Visit (INDEPENDENT_AMBULATORY_CARE_PROVIDER_SITE_OTHER): Payer: Medicare Other

## 2022-03-17 DIAGNOSIS — M810 Age-related osteoporosis without current pathological fracture: Secondary | ICD-10-CM

## 2022-03-17 MED ORDER — DENOSUMAB 60 MG/ML ~~LOC~~ SOSY
60.0000 mg | PREFILLED_SYRINGE | Freq: Once | SUBCUTANEOUS | Status: AC
  Administered 2022-03-17: 60 mg via SUBCUTANEOUS

## 2022-03-17 NOTE — Progress Notes (Signed)
Per orders of Dr. Gutierrez, injection of Prolia given by Deretha Ertle V Zera Markwardt. ?Patient tolerated injection well.  ?

## 2022-03-22 ENCOUNTER — Telehealth: Payer: Medicare Other

## 2022-03-26 ENCOUNTER — Other Ambulatory Visit: Payer: Self-pay | Admitting: Family Medicine

## 2022-03-27 ENCOUNTER — Other Ambulatory Visit: Payer: Self-pay | Admitting: Family Medicine

## 2022-03-27 MED ORDER — TRAMADOL HCL 50 MG PO TABS: 50.0000 mg | ORAL_TABLET | Freq: Four times a day (QID) | ORAL | 0 refills | Status: DC | PRN

## 2022-03-27 MED ORDER — CLONAZEPAM 0.5 MG PO TABS: ORAL_TABLET | ORAL | 0 refills | Status: DC

## 2022-03-27 NOTE — Telephone Encounter (Signed)
ERx 

## 2022-03-27 NOTE — Telephone Encounter (Signed)
Refill request ?Tramadol last refill 02/27/22 #30 ?Clonazepam last refill 02/27/22 #45 ?Last office visit 12/23/21 ?Upcoming appointment 05/03/22 ?

## 2022-03-28 ENCOUNTER — Other Ambulatory Visit: Payer: Self-pay | Admitting: Family Medicine

## 2022-03-29 ENCOUNTER — Encounter: Payer: Self-pay | Admitting: Family

## 2022-03-29 ENCOUNTER — Ambulatory Visit (INDEPENDENT_AMBULATORY_CARE_PROVIDER_SITE_OTHER)
Admission: RE | Admit: 2022-03-29 | Discharge: 2022-03-29 | Disposition: A | Discharge status: Home / Self Care | Payer: Medicare Other | Source: Ambulatory Visit | Attending: Family | Admitting: Family

## 2022-03-29 ENCOUNTER — Ambulatory Visit (INDEPENDENT_AMBULATORY_CARE_PROVIDER_SITE_OTHER): Payer: Medicare Other | Admitting: Family

## 2022-03-29 VITALS — BP 92/62 | HR 94 | Temp 97.9°F | Resp 16 | Ht 65.5 in | Wt 159.0 lb

## 2022-03-29 DIAGNOSIS — J029 Acute pharyngitis, unspecified: Secondary | ICD-10-CM | POA: Insufficient documentation

## 2022-03-29 DIAGNOSIS — J189 Pneumonia, unspecified organism: Secondary | ICD-10-CM | POA: Insufficient documentation

## 2022-03-29 DIAGNOSIS — R062 Wheezing: Secondary | ICD-10-CM

## 2022-03-29 DIAGNOSIS — J40 Bronchitis, not specified as acute or chronic: Secondary | ICD-10-CM | POA: Diagnosis not present

## 2022-03-29 HISTORY — DX: Pneumonia, unspecified organism: J18.9

## 2022-03-29 MED ORDER — AMOXICILLIN-POT CLAVULANATE 875-125 MG PO TABS: 1.0000 | ORAL_TABLET | Freq: Two times a day (BID) | ORAL | 0 refills | Status: AC

## 2022-03-29 NOTE — Assessment & Plan Note (Signed)
Rapid strep in office negative ?Warm salt water gargles ?

## 2022-03-29 NOTE — Assessment & Plan Note (Signed)
With some sob  ?Decrease breath sounds ?Ordering cxr pending results ?

## 2022-03-29 NOTE — Assessment & Plan Note (Signed)
Take antibiotic as prescribed. Increase oral fluids. Pt to f/u if sx worsen and or fail to improve in 2-3 days. rx augmentin 875/125 mg po bid x 10 days  

## 2022-03-29 NOTE — Progress Notes (Signed)
? ?Established Patient Office Visit ? ?Subjective:  ?Patient ID: [name], female    DOB: 1945/12/26  Age: 76 y.o. MRN: [id number] ? ?CC:  ?Chief Complaint  ?Patient presents with  ? Cough  ?  Covid test was neg  ? ? ?HPI ?SHYONNA CARLIN is here today with concerns.  ? ?C/o coughing that is productive, chest congestion, and some sob.  ?No nasal congestion, some sore throat, bad headache.  ?Covid tested negative at home. No ear pain.  ?No fever some chills.  ? ?No diarrhea nausea or vomiting.  ? ?Past Medical History:  ?Diagnosis Date  ? Allergy   ? ANEMIA-NOS 09/25/2007  ? Anxiety   ? Arthritis   ? B12 DEFICIENCY 05/03/2007  ? Blood transfusion without reported diagnosis   ? CAD (coronary artery disease) 01/2010  ? MI, Nish[place]  ? Cardiomyopathy ([place]) 02/08/2010  ? H/o this 2012 after urosepsis, no recurrence.   ? Cataract   ? CHF (congestive heart failure) ([place])   ? with episode of sepsis  ? Complication of anesthesia   ? Depression   ? not recently  ? FIBROMYALGIA 05/03/2007  ? Fibromyalgia   ? GERD 02/22/2010  ? Glaucoma 02/2013  ? [facility]  ? History of CHF (congestive heart failure) 01/2010  ? History of colon polyps 2004  ? HYPERLIPIDEMIA 12/19/2007  ? HYPOTENSION, ORTHOSTATIC 12/06/2008  ? Interstitial cystitis   ? [place] now Dr Amalia Hailey  ? Lupus (systemic lupus erythematosus) ([place]) 02/08/2010  ? MCTD (mixed connective tissue disease) ([place]) 02/08/2010  ? OSTEOPOROSIS 08/2009  ? bisphosphonate on hold 2/2 dysphagia, on reclast done in August each year  ? Parkinson's disease ([place]) 08/25/2015  ? Dx Dr Carles Collet 07/2015   ? PONV (postoperative nausea and vomiting)   ? 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 R leg and R arm  ? Takotsubo cardiomyopathy 2008  ? due to E coli urosepsis  ? ? ?Past Surgical History:  ?Procedure Laterality Date  ? ABDOMINAL HYSTERECTOMY  1970s  ? IUD infection - first partial then with oophorectomy (cysts), complication - low blood pressure  ? CATARACT EXTRACTION Bilateral   ?  CHOLECYSTECTOMY    ? complication - low blood pressure  ? COLONOSCOPY  06/2008  ? h/o polyps but latest WNL, rec rpt 10 yrs Olevia Perches)  ? COLONOSCOPY  11/2018  ? multiple TAs (10 polyps total), rpt 2 yrs Fuller Plan)  ? COLONOSCOPY  04/2021  ? multiple TAs, rpt 3 yrs Fuller Plan)  ? CYSTOSCOPY  12/2013  ? abx treatment for recurrent cystitis  ? DEXA  04/2013  ? T -2.9 @ femur, -1.6 @ spine  ? DEXA  04/2017  ? T -2.9 hip, -0.7 spine  ? ESOPHAGOGASTRODUODENOSCOPY  12/2017  ? WNL, regardless esophagus dilated, small HH Fuller Plan)  ? EYE SURGERY    ? LUMBAR LAMINECTOMY/DECOMPRESSION MICRODISCECTOMY Right 11/27/2019  ? Right Lumbar Four-Five foraminotomy;  Erline Levine, MD)  ? MINOR PLACEMENT OF FIDUCIAL N/A 06/30/2021  ? Procedure: MINOR PLACEMENT OF FIDUCIAL;  Surgeon: Erline Levine, MD;  Location: [facility];  Service: Neurosurgery;  Laterality: N/A;  Minor room  ? PTOSIS REPAIR Bilateral 10/2020  ? Plastic Surgery  ? PULSE GENERATOR IMPLANT Right 07/14/2021  ? Procedure: UNILATERAL PULSE GENERATOR IMPLANT;  Surgeon: Erline Levine, MD;  Location: [facility];  Service: Neurosurgery;  Laterality: Right;  ? SUBTHALAMIC STIMULATOR INSERTION Bilateral 07/07/2021  ? Procedure: Deep brain stimulator placement;  Surgeon: Erline Levine, MD;  Location: [facility];  Service:  Neurosurgery;  Laterality: Bilateral;  ? TUBAL LIGATION    ? UPPER GASTROINTESTINAL ENDOSCOPY    ? ? ?Family History  ?Problem Relation Age of Onset  ? Heart attack Father   ? Diabetes Father   ? Prostate cancer Father   ? Esophageal cancer Mother   ? Lung cancer Brother   ? Breast cancer Sister   ? Ovarian cancer Sister   ? Lung cancer Brother   ? CAD Brother   ? Uterine cancer Sister   ? Clotting disorder Son   ? Healthy Son   ? Healthy Daughter   ? Healthy Daughter   ? Colon cancer Neg Hx   ? Rectal cancer Neg Hx   ? Stomach cancer Neg Hx   ? ? ?Social History  ? ?Socioeconomic History  ? Marital status: Widowed  ?  Spouse name: Not on file  ? Number of children: 4  ? Years of  education: Not on file  ? Highest education level: 10th grade  ?Occupational History  ? Occupation: retired  ?Tobacco Use  ? Smoking status: Never  ? Smokeless tobacco: Never  ?Vaping Use  ? Vaping Use: Never used  ?Substance and Sexual Activity  ? Alcohol use: No  ? Drug use: Not Currently  ?  Types: Nitrous oxide  ? Sexual activity: Never  ?Other Topics Concern  ? Not on file  ?Social History Narrative  ? Widow - husband [name]) passed away 02/21/13.    ? Lives with daughter, 1 dog  ? Disability - fibromylagia, lupus, chronic R arm and leg pain (RSD)  ? 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 Activity: limited by back pain  ? Right Handed  ? ?Social Determinants of Health  ? ?Financial Resource Strain: Low Risk   ? Difficulty of Paying Living Expenses: Not hard at all  ?Food Insecurity: No Food Insecurity  ? Worried About Charity fundraiser in the Last Year: Never true  ? Ran Out of Food in the Last Year: Never true  ?Transportation Needs: No Transportation Needs  ? Lack of Transportation (Medical): No  ? Lack of Transportation (Non-Medical): No  ?Physical Activity: Inactive  ? Days of Exercise per Week: 0 days  ? Minutes of Exercise per Session: 0 min  ?Stress: No Stress Concern Present  ? Feeling of Stress : Not at all  ?Social Connections: Moderately Integrated  ? Frequency of Communication with Friends and Family: More than three times a week  ? Frequency of Social Gatherings with Friends and Family: Twice a week  ? Attends Religious Services: More than 4 times per year  ? Active Member of Clubs or Organizations: Yes  ? Attends Archivist Meetings: More than 4 times per year  ? Marital Status: Widowed  ?Intimate Partner Violence: Not At Risk  ? Fear of Current or Ex-Partner: No  ? Emotionally Abused: No  ? Physically Abused: No  ? Sexually Abused: No  ? ? ?Outpatient Medications Prior to Visit  ?Medication Sig Dispense Refill  ? acetaminophen (TYLENOL) 650 MG CR tablet  Take 2 tablets (1,300 mg total) by mouth every 8 (eight) hours as needed for pain.    ? AMBULATORY NON FORMULARY MEDICATION Lift chair ?Dx:  G20 1 Device 0  ? Calcium Carbonate-Vitamin D (CALCIUM 600/VITAMIN D) 600-400 MG-UNIT chew tablet Chew 1 tablet by mouth daily.    ? carbidopa-levodopa (SINEMET IR) 25-100 MG tablet TAKE 1 TABLETS BY MOUTH AT 6 AM ,  0.5 TABLET AT 9:30 AM, 0.5 PM AND 4:30 PM. MAY TAKE AN EXTRA ONE-HALF TABLET BY MOUTH AS NEEDED IN THE EVENING (Patient taking differently: Take 0.5 tablets by mouth in the morning and at bedtime. 0.5  TABLET BID BY MOUTH AS NEEDED IN THE EVENING) 360 tablet 1  ? Cholecalciferol (VITAMIN D3) 25 MCG (1000 UT) CAPS Take 1 capsule (1,000 Units total) by mouth daily. 30 capsule   ? clonazePAM (KLONOPIN) 0.5 MG tablet Take 0.5 tablets (0.25 mg total) by mouth every morning AND 1 tablet (0.5 mg total) at bedtime. 45 tablet 0  ? clotrimazole-betamethasone (LOTRISONE) cream Apply 1 application topically daily. 30 g 0  ? cyanocobalamin (,VITAMIN B-12,) 1000 MCG/ML injection Inject 1 mL (1,000 mcg total) into the muscle every 30 (thirty) days. 3 mL 2  ? denosumab (PROLIA) 60 MG/ML SOSY injection Inject 60 mg into the skin every 6 (six) months.    ? fludrocortisone (FLORINEF) 0.1 MG tablet TAKE ONE TABLET BY MOUTH DAILY 30 tablet 1  ? Glycerin-Hypromellose-PEG 400 (ARTIFICIAL TEARS) 0.2-0.2-1 % SOLN Place 1 drop into both eyes daily as needed (dry eyes).    ? latanoprost (XALATAN) 0.005 % ophthalmic solution Place 1 drop into both eyes at bedtime.    ? lubiprostone (AMITIZA) 24 MCG capsule Take 1 capsule (24 mcg total) by mouth 2 (two) times daily with a meal. 60 capsule 6  ? sertraline (ZOLOFT) 100 MG tablet Take 1 tablet (100 mg total) by mouth daily. 90 tablet 1  ? SYRINGE-NEEDLE, DISP, 3 ML (BD SAFETYGLIDE SYRINGE/NEEDLE) 25G X 1" 3 ML MISC Use to inject vitamin B12 monthly. 50 each 0  ? traMADol (ULTRAM) 50 MG tablet Take 1 tablet (50 mg total) by mouth every 6 (six) hours  as needed for moderate pain. 30 tablet 0  ? ?No facility-administered medications prior to visit.  ? ? ?Allergies  ?Allergen Reactions  ? Amitriptyline Other (See Comments)  ?  Sedated next morning  ? Ciproflox

## 2022-03-29 NOTE — Patient Instructions (Signed)
Complete xray(s) prior to leaving today. I will notify you of your results once received.  Due to recent changes in healthcare [place], you may see results of your imaging and/or laboratory studies on MyChart before I have had a chance to review them.  I understand that in some cases there may be results that are confusing or concerning to you. Please understand that not all results are received at the same time and often I may need to interpret multiple results in order to provide you with the best plan of care or course of treatment. Therefore, I ask that you please give me 2 business days to thoroughly review all your results before contacting my office for clarification. Should we see a critical lab result, you will be contacted sooner.   It was a pleasure seeing you today! Please do not hesitate to reach out with any questions and or concerns.  Regards,   Saadia Dewitt FNP-C  

## 2022-03-30 ENCOUNTER — Encounter: Payer: Self-pay | Admitting: Family Medicine

## 2022-03-30 DIAGNOSIS — R051 Acute cough: Secondary | ICD-10-CM

## 2022-03-30 MED ORDER — BENZONATATE 200 MG PO CAPS: 200.0000 mg | ORAL_CAPSULE | Freq: Three times a day (TID) | ORAL | 0 refills | Status: DC | PRN

## 2022-03-30 NOTE — Progress Notes (Signed)
Good morning Dr. Danise Mina,  ? ?I saw your patient yesterday for sickness. She was sob and decreased breath sounds. CXR suggests multifocal infection, likely pneumonia. I did RX augmentin for her yesterday. The suggestion for pneumonia would be Augmentin + Zpack since she can not have doxycycline. I know she is a higher risk patent with multiple medications. Would you prefer I just give Augmentin have her f/u one week see how she is doing, or do you feel ok with me prescribing the Zpack as well as recommended, and have her repeat CXR in 2 weeks.

## 2022-03-31 ENCOUNTER — Encounter: Payer: Self-pay | Admitting: Family Medicine

## 2022-03-31 MED ORDER — AZITHROMYCIN 250 MG PO TABS: ORAL_TABLET | ORAL | 0 refills | Status: DC

## 2022-03-31 NOTE — Progress Notes (Addendum)
Called pt and son, unable to reach them so sent mychart message.  ?Rec zpack + augmentin for CAP. ?Zpack sent to pharmacy.  ?Update [place] with effect.  ?

## 2022-03-31 NOTE — Addendum Note (Signed)
Addended by: [name] on: 03/31/2022 05:26 PM ? ? Modules accepted: Orders ? ?

## 2022-04-05 DIAGNOSIS — M5416 Radiculopathy, lumbar region: Secondary | ICD-10-CM | POA: Diagnosis not present

## 2022-04-09 ENCOUNTER — Other Ambulatory Visit: Payer: Self-pay | Admitting: Neurology

## 2022-04-12 ENCOUNTER — Encounter: Payer: Self-pay | Admitting: Family Medicine

## 2022-04-12 ENCOUNTER — Ambulatory Visit (INDEPENDENT_AMBULATORY_CARE_PROVIDER_SITE_OTHER): Payer: Medicare Other | Admitting: Family Medicine

## 2022-04-12 VITALS — BP 108/66 | HR 74 | Temp 97.7°F | Ht 65.5 in | Wt 162.4 lb

## 2022-04-12 DIAGNOSIS — M5416 Radiculopathy, lumbar region: Secondary | ICD-10-CM | POA: Diagnosis not present

## 2022-04-12 DIAGNOSIS — J189 Pneumonia, unspecified organism: Secondary | ICD-10-CM | POA: Diagnosis not present

## 2022-04-12 NOTE — Progress Notes (Signed)
? ? Patient ID: [name], female    DOB: November 19, 1946, 76 y.o.   MRN: [id number] ? ?This visit was conducted in person. ? ?BP 108/66   Pulse 74   Temp 97.7 ?F (36.5 ?C) (Temporal)   Ht 5' 5.5" (1.664 m)   Wt 162 lb 6 oz (73.7 kg)   SpO2 96%   BMI 26.61 kg/m?   ? ?CC: f/u bronchitis  ?Subjective:  ? ?HPI: ?[name] is a 76 y.o. female presenting on 04/12/2022 for Bronchitis (Here for 2 wk f/u and rpt CXR, per [place]. ) ? ? ?Recent trip to [facility] for son's singing competition.  ? ?Carrolyn Leigh NP early this month with productive cough associated with congestion and dyspnea, CXR showed possible CAP as per below. Treated with augmentin + azithromycin course.  ? ?Here for f/u. She is feeling significantly better, tolerated both antibiotics well. Never fever. No further cough, chest pain or shortness of breath.  ? ?03/29/2022 CXR IMPRESSION: ?Vague opacities in the left mid lung and corresponding to the ?lingula/middle lobe on the lateral view, concerning for multifocal ?infection. Followup PA and lateral chest X-ray is recommended in 3-4 ?weeks following therapy to assure resolution.  ? ? ?Continues seeing Dr Dawley neurosurgery for ongoing back pain (lumbar radiculopathy) s/p ESI. Planning to undergo lumbar laminectomy, we just received surgical clearance request this morning.  ? ?Trial Effexor XR last month caused worsening nervousness and difficulty sleeping at night time. Now back on sertraline '100mg'$  daily.  ?   ? ?Relevant past medical, surgical, family and social history reviewed and updated as indicated. Interim medical history since our last visit reviewed. ?Allergies and medications reviewed and updated. ?Outpatient Medications Prior to Visit  ?Medication Sig Dispense Refill  ? acetaminophen (TYLENOL) 650 MG CR tablet Take 2 tablets (1,300 mg total) by mouth every 8 (eight) hours as needed for pain.    ? AMBULATORY NON FORMULARY MEDICATION Lift chair ?Dx:  G20 1 Device 0  ? Calcium  Carbonate-Vitamin D (CALCIUM 600/VITAMIN D) 600-400 MG-UNIT chew tablet Chew 1 tablet by mouth daily.    ? carbidopa-levodopa (SINEMET IR) 25-100 MG tablet 1 po tid prn 270 tablet 1  ? Cholecalciferol (VITAMIN D3) 25 MCG (1000 UT) CAPS Take 1 capsule (1,000 Units total) by mouth daily. 30 capsule   ? clonazePAM (KLONOPIN) 0.5 MG tablet Take 0.5 tablets (0.25 mg total) by mouth every morning AND 1 tablet (0.5 mg total) at bedtime. 45 tablet 0  ? clotrimazole-betamethasone (LOTRISONE) cream Apply 1 application topically daily. 30 g 0  ? cyanocobalamin (,VITAMIN B-12,) 1000 MCG/ML injection Inject 1 mL (1,000 mcg total) into the muscle every 30 (thirty) days. 3 mL 2  ? denosumab (PROLIA) 60 MG/ML SOSY injection Inject 60 mg into the skin every 6 (six) months.    ? fludrocortisone (FLORINEF) 0.1 MG tablet TAKE ONE TABLET BY MOUTH DAILY 30 tablet 1  ? Glycerin-Hypromellose-PEG 400 (ARTIFICIAL TEARS) 0.2-0.2-1 % SOLN Place 1 drop into both eyes daily as needed (dry eyes).    ? latanoprost (XALATAN) 0.005 % ophthalmic solution Place 1 drop into both eyes at bedtime.    ? lubiprostone (AMITIZA) 24 MCG capsule Take 1 capsule (24 mcg total) by mouth 2 (two) times daily with a meal. 60 capsule 6  ? sertraline (ZOLOFT) 100 MG tablet Take 1 tablet (100 mg total) by mouth daily. 90 tablet 1  ? SYRINGE-NEEDLE, DISP, 3 ML (BD SAFETYGLIDE SYRINGE/NEEDLE) 25G X 1" 3 ML MISC  Use to inject vitamin B12 monthly. 50 each 0  ? traMADol (ULTRAM) 50 MG tablet Take 1 tablet (50 mg total) by mouth every 6 (six) hours as needed for moderate pain. 30 tablet 0  ? azithromycin (ZITHROMAX) 250 MG tablet Take two tablets on day one followed by one tablet on days 2-5 6 each 0  ? benzonatate (TESSALON) 200 MG capsule Take 1 capsule (200 mg total) by mouth 3 (three) times daily as needed for cough. 20 capsule 0  ? ?No facility-administered medications prior to visit.  ?  ? ?Per HPI unless specifically indicated in ROS section below ?Review of  Systems ? ?Objective:  ?BP 108/66   Pulse 74   Temp 97.7 ?F (36.5 ?C) (Temporal)   Ht 5' 5.5" (1.664 m)   Wt 162 lb 6 oz (73.7 kg)   SpO2 96%   BMI 26.61 kg/m?   ?Wt Readings from Last 3 Encounters:  ?04/12/22 162 lb 6 oz (73.7 kg)  ?03/29/22 159 lb (72.1 kg)  ?12/23/21 163 lb 4 oz (74 kg)  ?  ?  ?Physical Exam ?Vitals and nursing note reviewed.  ?Constitutional:   ?   Appearance: Normal appearance. She is not ill-appearing.  ?Cardiovascular:  ?   Rate and Rhythm: Normal rate and regular rhythm.  ?   Pulses: Normal pulses.  ?   Heart sounds: Normal heart sounds. No murmur heard. ?Pulmonary:  ?   Effort: Pulmonary effort is normal. No respiratory distress.  ?   Breath sounds: Normal breath sounds. No wheezing, rhonchi or rales.  ?Musculoskeletal:  ?   Right lower leg: No edema.  ?   Left lower leg: No edema.  ?Skin: ?   General: Skin is warm and dry.  ?   Findings: No rash.  ?Neurological:  ?   Mental Status: She is alert.  ?Psychiatric:     ?   Mood and Affect: Mood normal.     ?   Behavior: Behavior normal.  ? ?   ?Results for orders placed or performed in visit on 03/13/22  ?Basic Metabolic Panel  ?Result Value Ref Range  ? Sodium 141 135 - 145 mEq/L  ? Potassium 3.9 3.5 - 5.1 mEq/L  ? Chloride 105 96 - 112 mEq/L  ? CO2 29 19 - 32 mEq/L  ? Glucose, Bld 97 70 - 99 mg/dL  ? BUN 18 6 - 23 mg/dL  ? Creatinine, Ser 0.68 0.40 - 1.20 mg/dL  ? GFR 86.22 >60.00 mL/min  ? Calcium 9.0 8.4 - 10.5 mg/dL  ? ?*Note: Due to a large number of results and/or encounters for the requested time period, some results have not been displayed. A complete set of results can be found in Results Review.  ? ? ?Assessment & Plan:  ? ?Problem List Items Addressed This Visit   ? ? Right lumbar radiculopathy  ?  Planning lumbar laminectomy Ronnald Ramp).  ?Will return in 2 weeks for formal preop clearance.  ? ?  ?  ? CAP (community acquired pneumonia) - Primary  ?  Noted on CXR. Symptoms largely resolved after augmentin/zpack course. Will return  in 2 weeks for repeat CXR and preop clearance. Pt agrees with plan.  ? ?  ?  ?  ? ?No orders of the defined types were placed in this encounter. ? ?No orders of the defined types were placed in this encounter. ? ? ? ?Patient Instructions  ?You are doing well today ?Reschedule upcoming appointment in June to 2  weeks from now, we will check xray and do preop visit at that time.  ?I'm glad you're feeling better after pneumonia.  ? ?Follow up plan: ?Return in about 2 weeks (around 04/26/2022), or if symptoms worsen or fail to improve. ? ?[name], MD   ?

## 2022-04-12 NOTE — Assessment & Plan Note (Signed)
Noted on CXR. Symptoms largely resolved after augmentin/zpack course. Will return in 2 weeks for repeat CXR and preop clearance. Pt agrees with plan.  ?

## 2022-04-12 NOTE — Patient Instructions (Addendum)
You are doing well today ?Reschedule upcoming appointment in June to 2 weeks from now, we will check xray and do preop visit at that time.  ?I'm glad you're feeling better after pneumonia.  ?

## 2022-04-12 NOTE — Assessment & Plan Note (Signed)
Planning lumbar laminectomy Ronnald Ramp).  ?Will return in 2 weeks for formal preop clearance.  ?

## 2022-04-26 ENCOUNTER — Encounter: Payer: Self-pay | Admitting: Family Medicine

## 2022-04-26 ENCOUNTER — Ambulatory Visit (INDEPENDENT_AMBULATORY_CARE_PROVIDER_SITE_OTHER): Payer: Medicare Other | Admitting: Family Medicine

## 2022-04-26 ENCOUNTER — Ambulatory Visit (INDEPENDENT_AMBULATORY_CARE_PROVIDER_SITE_OTHER)
Admission: RE | Admit: 2022-04-26 | Discharge: 2022-04-26 | Disposition: A | Discharge status: Home / Self Care | Payer: Medicare Other | Source: Ambulatory Visit | Attending: Family Medicine | Admitting: Family Medicine

## 2022-04-26 VITALS — BP 122/76 | HR 70 | Temp 97.7°F | Ht 65.5 in | Wt 162.1 lb

## 2022-04-26 DIAGNOSIS — J189 Pneumonia, unspecified organism: Secondary | ICD-10-CM | POA: Diagnosis not present

## 2022-04-26 DIAGNOSIS — M5416 Radiculopathy, lumbar region: Secondary | ICD-10-CM

## 2022-04-26 DIAGNOSIS — M48061 Spinal stenosis, lumbar region without neurogenic claudication: Secondary | ICD-10-CM

## 2022-04-26 DIAGNOSIS — I2510 Atherosclerotic heart disease of native coronary artery without angina pectoris: Secondary | ICD-10-CM

## 2022-04-26 DIAGNOSIS — E538 Deficiency of other specified B group vitamins: Secondary | ICD-10-CM | POA: Diagnosis not present

## 2022-04-26 DIAGNOSIS — Z01818 Encounter for other preprocedural examination: Secondary | ICD-10-CM

## 2022-04-26 DIAGNOSIS — G20 Parkinson's disease: Secondary | ICD-10-CM

## 2022-04-26 HISTORY — DX: Encounter for other preprocedural examination: Z01.818

## 2022-04-26 LAB — BASIC METABOLIC PANEL
BUN: 15 mg/dL (ref 6–23)
CO2: 27 mEq/L (ref 19–32)
Calcium: 9.5 mg/dL (ref 8.4–10.5)
Chloride: 104 mEq/L (ref 96–112)
Creatinine, Ser: 0.72 mg/dL (ref 0.40–1.20)
GFR: 82.71 mL/min (ref >60.00)
Glucose, Bld: 97 mg/dL (ref 70–99)
Potassium: 4.2 mEq/L (ref 3.5–5.1)
Sodium: 139 mEq/L (ref 135–145)

## 2022-04-26 LAB — CBC WITH DIFFERENTIAL/PLATELET
Basophils Absolute: 0.1 10*3/uL (ref 0.0–0.1)
Basophils Relative: 0.9 % (ref 0.0–3.0)
Eosinophils Absolute: 0.2 10*3/uL (ref 0.0–0.7)
Eosinophils Relative: 3.2 % (ref 0.0–5.0)
HCT: 37.5 % (ref 36.0–46.0)
Hemoglobin: 12.1 g/dL (ref 12.0–15.0)
Lymphocytes Relative: 37.1 % (ref 12.0–46.0)
Lymphs Abs: 2.1 10*3/uL (ref 0.7–4.0)
MCHC: 32.3 g/dL (ref 30.0–36.0)
MCV: 86.9 fl (ref 78.0–100.0)
Monocytes Absolute: 0.4 10*3/uL (ref 0.1–1.0)
Monocytes Relative: 7.1 % (ref 3.0–12.0)
Neutro Abs: 3 10*3/uL (ref 1.4–7.7)
Neutrophils Relative %: 51.7 % (ref 43.0–77.0)
Platelets: 273 10*3/uL (ref 150.0–400.0)
RBC: 4.32 Mil/uL (ref 3.87–5.11)
RDW: 15 % (ref 11.5–15.5)
WBC: 5.8 10*3/uL (ref 4.0–10.5)

## 2022-04-26 LAB — VITAMIN B12: Vitamin B-12: 353 pg/mL (ref 211–911)

## 2022-04-26 LAB — HEMOGLOBIN A1C: Hgb A1c MFr Bld: 5.9 % (ref 4.6–6.5)

## 2022-04-26 MED ORDER — TRAMADOL HCL 50 MG PO TABS: 50.0000 mg | ORAL_TABLET | Freq: Four times a day (QID) | ORAL | 0 refills | Status: DC | PRN

## 2022-04-26 NOTE — Progress Notes (Addendum)
Patient ID: [name], female    DOB: 03-Dec-1945, 76 y.o.   MRN: [id number]  This visit was conducted in person.  BP 122/76   Pulse 70   Temp 97.7 F (36.5 C) (Temporal)   Ht 5' 5.5" (1.664 m)   Wt 162 lb 2 oz (73.5 kg)   SpO2 97%   BMI 26.57 kg/m    CC: preop evaluation, 6 mo f/u visit  Subjective:   HPI: [name] is a 76 y.o. female presenting on 04/26/2022 for Follow-up (Here for 6 mo f/u.  ) and Pre-op Exam (Received pre-op clearance form from [facility]- Dr. Pieter Partridge Dawley. )   [name]  has a past medical history of Allergy, ANEMIA-NOS (09/25/2007), Anxiety, Arthritis, B12 DEFICIENCY (05/03/2007), Blood transfusion without reported diagnosis, CAD (coronary artery disease) (01/2010), Cardiomyopathy ([place]) (02/08/2010), Cataract, CHF (congestive heart failure) ([place]), Complication of anesthesia, Depression, FIBROMYALGIA (05/03/2007), Fibromyalgia, GERD (02/22/2010), Glaucoma (02/2013), History of CHF (congestive heart failure) (01/2010), History of colon polyps (2004), HYPERLIPIDEMIA (12/19/2007), HYPOTENSION, ORTHOSTATIC (12/06/2008), Interstitial cystitis, Lupus (systemic lupus erythematosus) ([place]) (02/08/2010), MCTD (mixed connective tissue disease) ([place]) (02/08/2010), OSTEOPOROSIS (08/2009), Parkinson's disease ([place]) (08/25/2015), PONV (postoperative nausea and vomiting), 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  Recent community acquired pneumonia treated with augmentin and azithromycin - due for rpt CXR today.   Last B12 injection 04/06/2022 (son administers at home).   Planned upcoming lumbar laminectomy under general anesthesia for persistent lumbar radiculopathy despite lumbar ESI. Back and leg pain is activity limiting, difficulty walking due to this, notes leg weakness and it gives out.   Patient has tolerated general anesthesia well in the past.  Latest surgical intervention was DBS placement 06/2021,  lumbar R L4/5 laminectomy 2020 Vertell Limber).  Denies trouble with post-op nausea/vomiting, or trouble awakening after surgery.   Denies chest pain, dyspnea, palpitations, leg swelling, HA, dizziness.  No fevers/chills, coughing, abd pain, diarrhea. No UTI symptoms.   Upcoming appt with Dr [name] next week for PD.      Relevant past medical, surgical, family and social history reviewed and updated as indicated. Interim medical history since our last visit reviewed. Allergies and medications reviewed and updated. Outpatient Medications Prior to Visit  Medication Sig Dispense Refill   acetaminophen (TYLENOL) 650 MG CR tablet Take 2 tablets (1,300 mg total) by mouth every 8 (eight) hours as needed for pain.     AMBULATORY NON FORMULARY MEDICATION Lift chair Dx:  G20 1 Device 0   Calcium Carbonate-Vitamin D (CALCIUM 600/VITAMIN D) 600-400 MG-UNIT chew tablet Chew 1 tablet by mouth daily.     carbidopa-levodopa (SINEMET IR) 25-100 MG tablet 1 po tid prn 270 tablet 1   Cholecalciferol (VITAMIN D3) 25 MCG (1000 UT) CAPS Take 1 capsule (1,000 Units total) by mouth daily. 30 capsule    clonazePAM (KLONOPIN) 0.5 MG tablet Take 0.5 tablets (0.25 mg total) by mouth every morning AND 1 tablet (0.5 mg total) at bedtime. 45 tablet 0   clotrimazole-betamethasone (LOTRISONE) cream Apply 1 application topically daily. 30 g 0   cyanocobalamin (,VITAMIN B-12,) 1000 MCG/ML injection Inject 1 mL (1,000 mcg total) into the muscle every 30 (thirty) days. 3 mL 2   denosumab (PROLIA) 60 MG/ML SOSY injection Inject 60 mg into the skin every 6 (six) months.     fludrocortisone (FLORINEF) 0.1 MG tablet TAKE ONE TABLET BY MOUTH DAILY 30 tablet 1   Glycerin-Hypromellose-PEG 400 (ARTIFICIAL TEARS) 0.2-0.2-1 % SOLN Place 1 drop  into both eyes daily as needed (dry eyes).     latanoprost (XALATAN) 0.005 % ophthalmic solution Place 1 drop into both eyes at bedtime.     lubiprostone (AMITIZA) 24 MCG capsule Take 1 capsule (24 mcg  total) by mouth 2 (two) times daily with a meal. 60 capsule 6   sertraline (ZOLOFT) 100 MG tablet Take 1 tablet (100 mg total) by mouth daily. 90 tablet 1   SYRINGE-NEEDLE, DISP, 3 ML (BD SAFETYGLIDE SYRINGE/NEEDLE) 25G X 1" 3 ML MISC Use to inject vitamin B12 monthly. 50 each 0   traMADol (ULTRAM) 50 MG tablet Take 1 tablet (50 mg total) by mouth every 6 (six) hours as needed for moderate pain. 30 tablet 0   No facility-administered medications prior to visit.     Per HPI unless specifically indicated in ROS section below Review of Systems  Objective:  BP 122/76   Pulse 70   Temp 97.7 F (36.5 C) (Temporal)   Ht 5' 5.5" (1.664 m)   Wt 162 lb 2 oz (73.5 kg)   SpO2 97%   BMI 26.57 kg/m   Wt Readings from Last 3 Encounters:  04/26/22 162 lb 2 oz (73.5 kg)  04/12/22 162 lb 6 oz (73.7 kg)  03/29/22 159 lb (72.1 kg)      Physical Exam Vitals and nursing note reviewed.  Constitutional:      Appearance: Normal appearance. She is not ill-appearing.     Comments: Ambulates with cane  Cardiovascular:     Rate and Rhythm: Normal rate and regular rhythm.     Pulses: Normal pulses.     Heart sounds: Normal heart sounds. No murmur heard. Pulmonary:     Effort: Pulmonary effort is normal. No respiratory distress.     Breath sounds: Normal breath sounds. No wheezing, rhonchi or rales.  Musculoskeletal:     Right lower leg: No edema.     Left lower leg: No edema.  Skin:    General: Skin is warm and dry.     Findings: No rash.  Neurological:     Mental Status: She is alert.  Psychiatric:        Mood and Affect: Mood normal.        Behavior: Behavior normal.      Results for orders placed or performed in visit on 53/97/67  Basic metabolic panel  Result Value Ref Range   Sodium 139 135 - 145 mEq/L   Potassium 4.2 3.5 - 5.1 mEq/L   Chloride 104 96 - 112 mEq/L   CO2 27 19 - 32 mEq/L   Glucose, Bld 97 70 - 99 mg/dL   BUN 15 6 - 23 mg/dL   Creatinine, Ser 0.72 0.40 - 1.20 mg/dL    GFR 82.71 >60.00 mL/min   Calcium 9.5 8.4 - 10.5 mg/dL  CBC with Differential/Platelet  Result Value Ref Range   WBC 5.8 4.0 - 10.5 K/uL   RBC 4.32 3.87 - 5.11 Mil/uL   Hemoglobin 12.1 12.0 - 15.0 g/dL   HCT 37.5 36.0 - 46.0 %   MCV 86.9 78.0 - 100.0 fl   MCHC 32.3 30.0 - 36.0 g/dL   RDW 15.0 11.5 - 15.5 %   Platelets 273.0 150.0 - 400.0 K/uL   Neutrophils Relative % 51.7 43.0 - 77.0 %   Lymphocytes Relative 37.1 12.0 - 46.0 %   Monocytes Relative 7.1 3.0 - 12.0 %   Eosinophils Relative 3.2 0.0 - 5.0 %   Basophils Relative 0.9 0.0 -  3.0 %   Neutro Abs 3.0 1.4 - 7.7 K/uL   Lymphs Abs 2.1 0.7 - 4.0 K/uL   Monocytes Absolute 0.4 0.1 - 1.0 K/uL   Eosinophils Absolute 0.2 0.0 - 0.7 K/uL   Basophils Absolute 0.1 0.0 - 0.1 K/uL  Hemoglobin A1c  Result Value Ref Range   Hgb A1c MFr Bld 5.9 4.6 - 6.5 %  Vitamin B12  Result Value Ref Range   Vitamin B-12 353 211 - 911 pg/mL   *Note: Due to a large number of results and/or encounters for the requested time period, some results have not been displayed. A complete set of results can be found in Results Review.   DG Chest 2 View CLINICAL DATA:  Follow-up pulmonary infiltrate. Preoperative evaluation.  EXAM: CHEST - 2 VIEW  COMPARISON:  Chest x-ray Mar 29, 2022  FINDINGS: The previously identified infiltrate is improved. A mildly nodular opacity remains in the same region in the left mid lung. The heart, hila, mediastinum are normal. Mild opacity in the right upper lobe not identified previously. Stable pacemaker generator with leads ascending above today's study. No other acute abnormalities. Healed rib fractures again identified.  IMPRESSION: 1. The previously identified left-sided infiltrate has improved. However, a mildly nodular opacity remains in this region which could represent resolving infiltrate, confluence of shadows, or a pulmonary nodule. Recommend an additional short-term follow-up chest x-ray. If the finding  persists, recommend a CT scan of the chest. 2. Patchy somewhat nodular opacity in the right apex not definitely seen previously. Recommend attention on follow-up. 3. No other interval changes or acute abnormalities.  These results will be called to the ordering clinician or representative by the Radiologist Assistant, and communication documented in the PACS or [employer].  Electronically Signed   By: Dorise Bullion III M.D.   On: 04/27/2022 14:01  EKG - NSR rate 75, axis appears normal, good R wave progression, rest unreadable due to artifact from deep brain stimulator   EKG with DBS off - NSR rate 75, normal axis, intervals, no hypertrophy, abnormal T waves anteriorly ?artifact  Assessment & Plan:   Problem List Items Addressed This Visit     Parkinson's disease ([place]) (Chronic)    Upcoming neurology appt        Vitamin B12 deficiency   Relevant Orders   Vitamin B12 (Completed)   CAD (coronary artery disease)    H/o MI during hospitalization with critical illness (2011) - [facility].  Thought Takotsubo cardiomyopathy, less likely MI versus cardiomyopathy of sepsis. Heart function normalized on recheck, likely no ischemic cardiac disease.  Echocardiogram 2022 WNL  Update EKG today.       Right lumbar radiculopathy    Pending lumbar laminectomy by neurosurgery as per above.        Degenerative lumbar spinal stenosis   CAP (community acquired pneumonia)    Early May 2023.  Clinically resolved. Update CXR.        Relevant Orders   DG Chest 2 View (Completed)   Pre-op evaluation - Primary    RCRI = 0 EKG with artifact due to DBS.  Anticipate adequately low risk to proceed with planned spine surgery under general anesthesia.  She is not on blood thinner.  Parkinson regimen to be managed by neurologist (appt next week).        Relevant Orders   DG Chest 2 View (Completed)   EKG 12-Lead (Completed)   Basic metabolic panel (Completed)   CBC with  Differential/Platelet (Completed)  Hemoglobin A1c (Completed)     Meds ordered this encounter  Medications   traMADol (ULTRAM) 50 MG tablet    Sig: Take 1 tablet (50 mg total) by mouth every 6 (six) hours as needed for moderate pain.    Dispense:  30 tablet    Refill:  0    Not to exceed 5 additional fills before 06/17/2022   Orders Placed This Encounter  Procedures   DG Chest 2 View    Standing Status:   Future    Number of Occurrences:   1    Standing Expiration Date:   04/27/2023    Order Specific Question:   Reason for Exam (SYMPTOM  OR DIAGNOSIS REQUIRED)    Answer:   preop eval, f/u CAP    Order Specific Question:   Preferred imaging location?    Answer:   [facility]   Basic metabolic panel   CBC with Differential/Platelet   Hemoglobin A1c   Vitamin B12   EKG 12-Lead     Patient Instructions  EKG, chest xray, labs today.  We will forward all results to Dr Rubbie Battiest office.  Good to see you today, I hope you have a speedy recovery!   Follow up plan: Return if symptoms worsen or fail to improve.  [name], MD

## 2022-04-26 NOTE — Patient Instructions (Addendum)
EKG, chest xray, labs today.  We will forward all results to Dr Rubbie Battiest office.  Good to see you today, I hope you have a speedy recovery!

## 2022-04-27 ENCOUNTER — Telehealth: Payer: Self-pay

## 2022-04-27 ENCOUNTER — Other Ambulatory Visit: Payer: Self-pay | Admitting: Neurological Surgery

## 2022-04-27 DIAGNOSIS — J189 Pneumonia, unspecified organism: Secondary | ICD-10-CM

## 2022-04-27 NOTE — Addendum Note (Signed)
Addended by: [name] on: 04/27/2022 10:10 PM   Modules accepted: Orders

## 2022-04-27 NOTE — Assessment & Plan Note (Signed)
RCRI = 0 EKG with artifact due to DBS.  Anticipate adequately low risk to proceed with planned spine surgery under general anesthesia.  She is not on blood thinner.  Parkinson regimen to be managed by neurologist (appt next week).

## 2022-04-27 NOTE — Assessment & Plan Note (Addendum)
H/o MI during hospitalization with critical illness (2011) - [facility].  Thought Takotsubo cardiomyopathy, less likely MI versus cardiomyopathy of sepsis. Heart function normalized on recheck, likely no ischemic cardiac disease.  Echocardiogram 2022 WNL  Update EKG today.

## 2022-04-27 NOTE — Assessment & Plan Note (Addendum)
Early May 2023.  Clinically resolved. Update CXR.

## 2022-04-27 NOTE — Assessment & Plan Note (Signed)
Upcoming neurology appt

## 2022-04-27 NOTE — Telephone Encounter (Signed)
Received a call from [facility], they need to notify PCP of Xray results.

## 2022-04-27 NOTE — Telephone Encounter (Signed)
Rtn call [facility] and spoke with Opal Sidles.  She wants to specifically make Dr. Darnell Level aware of the #1 notice in impression.   1. The previously identified left-sided infiltrate has improved. However, a mildly nodular opacity remains in this region which could represent resolving infiltrate, confluence of shadows, or a pulmonary nodule. Recommend an additional short-term follow-up chest x-ray. If the finding persists, recommend a CT scan of the chest.

## 2022-04-27 NOTE — Assessment & Plan Note (Signed)
Pending lumbar laminectomy by neurosurgery as per above.

## 2022-04-27 NOTE — Telephone Encounter (Signed)
Noted. See results note on CXR.

## 2022-04-28 NOTE — Telephone Encounter (Signed)
Lvm asking pt to call back.  I need to speak with pt.

## 2022-04-28 NOTE — Progress Notes (Signed)
Assessment/Plan:    1.  Parkinsons Disease             -Patient completed neurocognitive testing on January 25, 2021 with Dr. Nicole Kindred.  Evidence of MCI only.  -Patient is status post bilateral STN DBS on July 07, 2021 with IPG placement on July 14, 2021.    -Patient just takes levodopa on an as-needed basis, but estimates that she takes about 1 levodopa per day.  She can continue that.  She has not had any dyskinesia  -Would like to send her to PT/ST but she is getting ready to undergo surgery for her back.  We will do this after her back surgery. 2.  Dyskinesia             -Resolved 3.  Neurogenic Orthostatic Hypotension             -Did well on Northera.  Patient stopped that on her own.  Now on Florinef 0.1 mg daily 4.  Depression             -On sertraline             -Declines counseling and thinks she is doing well. 5.  b12 deficiency             -on injections. 6.  Urinary incontinence             -on myrbetriq.  She is doing well in that regard 7.  Lumbar radiculopathy             -She is status post lumbar foraminotomy with Dr. Vertell Limber on November 27, 2019.  Pain has reemerged and she is scheduled for lumbar laminectomy at L4/L5 with Dr. Reatha Armour on May 16, 2022 8.  Depression  -On sertraline, 100 mg daily. 9.  Dysphagia  -We will order mbe   Subjective:   [name] was seen today in follow up for levodopa challenge.  My previous records were reviewed prior to todays visit as well as outside records available to me.  Patient has been following with Dr. Reatha Armour for her back pain.  She is scheduled for lumbar laminectomy on June 20.  Notes are reviewed.  Her son emailed me in March stating that patient was shuffling more.  Patient thought it was from the back pain, but son wanted to know if it had something to do with the device.  I told him that it did not have anything to do with the DBS, but told them it could be due to the fact that she really had not been taking  levodopa.  I asked them in the email if she had tried taking levodopa, but also told them we could get them back in before today's appointment.  They stated that she was going to try to increase her levodopa to 1 tablet twice per day (she had apparently been taking half tablet twice per day).  She states that she is currently doing 1 tablet in the morning and sometimes another half tablet later on.  She did not take anything today.  Current prescribed movement disorder medications: Carbidopa/levodopa 25/100, 1 tablet twice per day   PREVIOUS MEDICATIONS: northera (worked well but pt d/c when they quit sending b/c she owed $); amantadine (discontinued because of hallucinations); pramipexole (decreased in past because of hallucinations/dyskinesia); clonazepam (stopped it because of fatigue, but also because she just did not need it any longer)  ALLERGIES:   Allergies  Allergen Reactions   Amitriptyline  Other (See Comments)    Sedated next morning   Ciprofloxacin Nausea And Vomiting   Cymbalta [Duloxetine Hcl] Other (See Comments)    Worsened depression   Iohexol Other (See Comments)     Code: HIVES, Desc: PT developed 2 hives, followed by SOB, severe headache post 87cc's Omnipaque 300., Onset Date: [id number]    Lyrica [Pregabalin] Other (See Comments)    Tried during hospitalization - unsure effects but unable to tolerate   Imipramine Hcl Rash   Iodine Rash   Lidocaine Hcl Rash   Morphine Sulfate Rash   Neosporin [Neomycin-Bacitracin Zn-Polymyx] Rash and Other (See Comments)    Worsened skin breaking out   Sulfamethoxazole Rash   Tetracyclines & Related Rash    CURRENT MEDICATIONS:  Outpatient Encounter Medications as of 05/01/2022  Medication Sig   acetaminophen (TYLENOL) 650 MG CR tablet Take 2 tablets (1,300 mg total) by mouth every 8 (eight) hours as needed for pain.   AMBULATORY NON FORMULARY MEDICATION Lift chair Dx:  G20   Calcium Carbonate-Vitamin D (CALCIUM 600/VITAMIN D)  600-400 MG-UNIT chew tablet Chew 1 tablet by mouth daily.   carbidopa-levodopa (SINEMET IR) 25-100 MG tablet 1 po tid prn (Patient taking differently: 1 po tid prn Per patient she takes it twice a day on some days)   Cholecalciferol (VITAMIN D3) 25 MCG (1000 UT) CAPS Take 1 capsule (1,000 Units total) by mouth daily.   clonazePAM (KLONOPIN) 0.5 MG tablet Take 0.5 tablets (0.25 mg total) by mouth every morning AND 1 tablet (0.5 mg total) at bedtime.   clotrimazole-betamethasone (LOTRISONE) cream Apply 1 application topically daily.   cyanocobalamin (,VITAMIN B-12,) 1000 MCG/ML injection Inject 1 mL (1,000 mcg total) into the muscle every 30 (thirty) days.   denosumab (PROLIA) 60 MG/ML SOSY injection Inject 60 mg into the skin every 6 (six) months.   fludrocortisone (FLORINEF) 0.1 MG tablet TAKE ONE TABLET BY MOUTH DAILY   Glycerin-Hypromellose-PEG 400 (ARTIFICIAL TEARS) 0.2-0.2-1 % SOLN Place 1 drop into both eyes daily as needed (dry eyes).   latanoprost (XALATAN) 0.005 % ophthalmic solution Place 1 drop into both eyes at bedtime.   sertraline (ZOLOFT) 100 MG tablet Take 1 tablet (100 mg total) by mouth daily.   SYRINGE-NEEDLE, DISP, 3 ML (BD SAFETYGLIDE SYRINGE/NEEDLE) 25G X 1" 3 ML MISC Use to inject vitamin B12 monthly.   traMADol (ULTRAM) 50 MG tablet Take 1 tablet (50 mg total) by mouth every 6 (six) hours as needed for moderate pain.   lubiprostone (AMITIZA) 24 MCG capsule Take 1 capsule (24 mcg total) by mouth 2 (two) times daily with a meal. (Patient not taking: Reported on 05/01/2022)   No facility-administered encounter medications on file as of 05/01/2022.    Objective:   PHYSICAL EXAMINATION:    VITALS:   Vitals:   05/01/22 1426  BP: 131/81  Pulse: 77  SpO2: 99%  Weight: 163 lb (73.9 kg)  Height: 5' 5.5" (1.664 m)        GEN:  The patient appears stated age and is in NAD. HEENT:  Normocephalic, atraumatic.  The mucous membranes are moist. The superficial temporal  arteries are without ropiness or tenderness. CV:  RRR Lungs:  CTAB Neck/HEME:  There are no carotid bruits bilaterally.  Neurological examination:  Orientation: The patient is alert and oriented x3. Cranial nerves: There is chronic L facial droop.  Smile is symmetric. The speech is fluent and hypophonic and occasionally dysarthric. Soft palate rises symmetrically and there is no tongue deviation.  Hearing is intact to conversational tone. Sensation: Sensation is intact to light touch throughout Motor: Strength is 5/5 in the bilateral upper and lower extremities.   Shoulder shrug is equal and symmetric.  There is no pronator drift.  Movement examination: Tone: There is normal tone in the upper and lower extremities before and after programming Abnormal movements: Rare left upper extremity rest tremor. Coordination:  There is normal rapid alternating movements when she left today. Gait and Station: She drags the right leg with ambulation.  She is somewhat short stepped, but does not shuffle.    Chemistry      Component Value Date/Time   NA 139 04/26/2022 1159   K 4.2 04/26/2022 1159   CL 104 04/26/2022 1159   CO2 27 04/26/2022 1159   BUN 15 04/26/2022 1159   CREATININE 0.72 04/26/2022 1159   CREATININE 0.87 10/08/2019 0921      Component Value Date/Time   CALCIUM 9.5 04/26/2022 1159   ALKPHOS 64 11/02/2021 1058   AST 14 11/02/2021 1058   ALT 5 11/02/2021 1058   BILITOT 0.3 11/02/2021 1058       Lab Results  Component Value Date   WBC 5.8 04/26/2022   HGB 12.1 04/26/2022   HCT 37.5 04/26/2022   MCV 86.9 04/26/2022   PLT 273.0 04/26/2022    Lab Results  Component Value Date   TSH 0.49 10/04/2018    Total time spent on today's visit was 40 minutes, including both face-to-face time and nonface-to-face time.  Time included that spent on review of records (prior notes available to me/labs/imaging if pertinent), discussing treatment and goals, answering patient's questions  and coordinating care.  This did not include DBS time.  Much of the visit was spent in DBS care, which is documented separate, and coded separate.   Cc:  [name], MD

## 2022-04-28 NOTE — Telephone Encounter (Signed)
I would also like her to return for repeat EKG. We may shut off DBS (she can do this) temporarily while we get EKG.

## 2022-05-01 ENCOUNTER — Ambulatory Visit: Payer: Medicare Other | Admitting: Neurology

## 2022-05-01 ENCOUNTER — Encounter: Payer: Self-pay | Admitting: Neurology

## 2022-05-01 ENCOUNTER — Other Ambulatory Visit: Payer: Self-pay | Admitting: Neurological Surgery

## 2022-05-01 VITALS — BP 131/81 | HR 77 | Ht 65.5 in | Wt 163.0 lb

## 2022-05-01 DIAGNOSIS — R1319 Other dysphagia: Secondary | ICD-10-CM

## 2022-05-01 DIAGNOSIS — G20 Parkinson's disease: Secondary | ICD-10-CM | POA: Diagnosis not present

## 2022-05-01 NOTE — Procedures (Signed)
DBS Programming was performed.    Manufacturer of DBS device: [employer], bluetooth  Total time spent programming was 25 minutes.  Device was confirmed to be on.  Soft start was confirmed to be on.  Impedences were checked and were within normal limits.  Battery was checked and was determined to be functioning normally and not near the end of life.  Final settings were as follows:    Active Contact Amplitude (mA) PW (ms) Frequency (hz) Side Effects  Left Brain       08/08/21 3-C+ 1.7 60 130   Other trials        1-C+ 2.7 60 130 tremor   (2/3/4)-C+ 3.0 60 130 Face pull above 2.1   (5/6/7)-C+ 1.8 60 130 ? Face pull (some baseline facial asymmetry)  08/22/21 5-(25%)7-(75%)C+ 2.5 60 159   09/05/21 final 5-(25%)6-(75%)C+ 2.9 60 159   Other trials 5-(50%)6-(50%)C+ 2.9 60 159    5-(25%)7-(75%)C+ 3.5 60 159 Tremor not controlled  10/31/21 5-(25%)7-(75%)C+ 3.3 60 159   05/01/22 5-(25%)7-(75%)C+ 3.6 60 159 Transient lip tingle                       Right Brain       08/08/21 (5/6/7)-C+ 1.6 60 130   Other trials 1-C+ 1.5 60 130 ? Speech change   (2/3/4)-C+ 1.5 60 130 Pretty good   8-C+ 1.6 60 130 tremor  08/22/21 8-C+ 2.7 60 136   09/05/21 final 6-(50%)8-(50%)C+ 2.9 60 159   others 6-(60%)8-(40%)C+ 2.8  60 136 Mouth dyskinesia  10/31/21 6-(50%)8-(50%)C+ 3.4 60 159   05/01/22 5-(25%)7-(75%)C+ 3.6 60 159

## 2022-05-01 NOTE — Telephone Encounter (Signed)
Lvm asking pt to call back.  I need to speak with pt.      I also sent a MyChart message.

## 2022-05-01 NOTE — Telephone Encounter (Signed)
Pt rtn call.  I relayed Dr. Synthia Innocent message.  Pt states she can come tomorrow about 4:15- 4:30 to rpt EKG. No appt needed.

## 2022-05-02 ENCOUNTER — Other Ambulatory Visit (HOSPITAL_COMMUNITY): Payer: Self-pay | Admitting: *Deleted

## 2022-05-02 DIAGNOSIS — R1310 Dysphagia, unspecified: Secondary | ICD-10-CM

## 2022-05-02 NOTE — Addendum Note (Signed)
Addended by: Brenton Grills on: 06/30/3743 51:46 PM   Modules accepted: Orders

## 2022-05-03 ENCOUNTER — Ambulatory Visit: Payer: Medicare Other | Admitting: Family Medicine

## 2022-05-08 NOTE — Progress Notes (Signed)
Surgical Instructions    Your procedure is scheduled on Tuesday June 20th.  Report to [facility] Main Entrance "A" at 9 A.M., then check in with the Admitting office.  Call this number if you have problems the morning of surgery:  [phone]   If you have any questions prior to your surgery date call [phone]: Open Monday-Friday 8am-4pm    Remember:  Do not eat or drink after midnight the night before your surgery      Take these medicines the morning of surgery with A SIP OF WATER: carbidopa-levodopa (SINEMET IR) 25-100 MG tablet clonazePAM (KLONOPIN) 0.5 MG tablet fludrocortisone (FLORINEF) 0.1 MG tablet sertraline (ZOLOFT) 100 MG tablet   IF NEEDED acetaminophen (TYLENOL) 650 MG CR tablet Glycerin-Hypromellose-PEG 400 (ARTIFICIAL TEARS) 0.2-0.2-1 % SOLN traMADol (ULTRAM) 50 MG tablet    As of today, STOP taking any Aspirin (unless otherwise instructed by your surgeon) Aleve, Naproxen, Ibuprofen, Motrin, Advil, Goody's, BC's, all herbal medications, fish oil, and all vitamins.           Do not wear jewelry or makeup Do not wear lotions, powders, perfumes, or deodorant. Do not shave 48 hours prior to surgery.   Do not bring valuables to the hospital. Do not wear nail polish, gel polish, artificial nails, or any other type of covering on natural nails (fingers and toes) If you have artificial nails or gel coating that need to be removed by a nail salon, please have this removed prior to surgery. Artificial nails or gel coating may interfere with anesthesia's ability to adequately monitor your vital signs.  [facility] is not responsible for any belongings or valuables. .   Do NOT Smoke (Tobacco/Vaping)  24 hours prior to your procedure  If you use a CPAP at night, you may bring your mask for your overnight stay.   Contacts, glasses, hearing aids, dentures or partials may not be worn into surgery, please bring cases for these belongings   For patients admitted to  the hospital, discharge time will be determined by your treatment team.   Patients discharged the day of surgery will not be allowed to drive home, and someone needs to stay with them for 24 hours.   SURGICAL WAITING ROOM VISITATION Patients having surgery or a procedure in a hospital may have two support people. Children under the age of 62 must have an adult with them who is not the patient. They may stay in the waiting area during the procedure and may switch out with other visitors. If the patient needs to stay at the hospital during part of their recovery, the visitor guidelines for inpatient rooms apply.  Please refer to the [facility] website for the visitor guidelines for Inpatients (after your surgery is over and you are in a regular room).       Special instructions:    Oral Hygiene is also important to reduce your risk of infection.  Remember - BRUSH YOUR TEETH THE MORNING OF SURGERY WITH YOUR REGULAR TOOTHPASTE   - Preparing For Surgery  Before surgery, you can play an important role. Because skin is not sterile, your skin needs to be as free of germs as possible. You can reduce the number of germs on your skin by washing with CHG (chlorahexidine gluconate) Soap before surgery.  CHG is an antiseptic cleaner which kills germs and bonds with the skin to continue killing germs even after washing.     Please do not use if you have an allergy to  CHG or antibacterial soaps. If your skin becomes reddened/irritated stop using the CHG.  Do not shave (including legs and underarms) for at least 48 hours prior to first CHG shower. It is OK to shave your face.  Please follow these instructions carefully.     Shower the NIGHT BEFORE SURGERY and the MORNING OF SURGERY with CHG Soap.   If you chose to wash your hair, wash your hair first as usual with your normal shampoo. After you shampoo, rinse your hair and body thoroughly to remove the shampoo.  Then [employer] and genitals  (private parts) with your normal soap and rinse thoroughly to remove soap.  After that Use CHG Soap as you would any other liquid soap. You can apply CHG directly to the skin and wash gently with a scrungie or a clean washcloth.   Apply the CHG Soap to your body ONLY FROM THE NECK DOWN.  Do not use on open wounds or open sores. Avoid contact with your eyes, ears, mouth and genitals (private parts). Wash Face and genitals (private parts)  with your normal soap.   Wash thoroughly, paying special attention to the area where your surgery will be performed.  Thoroughly rinse your body with warm water from the neck down.  DO NOT shower/wash with your normal soap after using and rinsing off the CHG Soap.  Pat yourself dry with a CLEAN TOWEL.  Wear CLEAN PAJAMAS to bed the night before surgery  Place CLEAN SHEETS on your bed the night before your surgery  DO NOT SLEEP WITH PETS.   Day of Surgery:  Take a shower with CHG soap. Wear Clean/Comfortable clothing the morning of surgery Do not apply any deodorants/lotions.   Remember to brush your teeth WITH YOUR REGULAR TOOTHPASTE.    If you received a COVID test during your pre-op visit, it is requested that you wear a mask when out in public, stay away from anyone that may not be feeling well, and notify your surgeon if you develop symptoms. If you have been in contact with anyone that has tested positive in the last 10 days, please notify your surgeon.    Please read over the following fact sheets that you were given.

## 2022-05-09 ENCOUNTER — Encounter (HOSPITAL_COMMUNITY)
Admission: RE | Admit: 2022-05-09 | Discharge: 2022-05-09 | Disposition: A | Discharge status: Home / Self Care | Payer: Medicare Other | Source: Ambulatory Visit | Attending: Neurological Surgery | Admitting: Neurological Surgery

## 2022-05-09 ENCOUNTER — Other Ambulatory Visit: Payer: Self-pay

## 2022-05-09 ENCOUNTER — Encounter (HOSPITAL_COMMUNITY): Payer: Self-pay

## 2022-05-09 VITALS — BP 125/60 | HR 76 | Temp 97.7°F | Resp 17 | Ht 65.0 in | Wt 163.6 lb

## 2022-05-09 DIAGNOSIS — Z79899 Other long term (current) drug therapy: Secondary | ICD-10-CM | POA: Diagnosis not present

## 2022-05-09 DIAGNOSIS — G20 Parkinson's disease: Secondary | ICD-10-CM | POA: Insufficient documentation

## 2022-05-09 DIAGNOSIS — I951 Orthostatic hypotension: Secondary | ICD-10-CM | POA: Insufficient documentation

## 2022-05-09 DIAGNOSIS — Z01812 Encounter for preprocedural laboratory examination: Secondary | ICD-10-CM | POA: Diagnosis not present

## 2022-05-09 DIAGNOSIS — Z01818 Encounter for other preprocedural examination: Secondary | ICD-10-CM

## 2022-05-09 HISTORY — DX: Headache, unspecified: R51.9

## 2022-05-09 HISTORY — DX: Pneumonia, unspecified organism: J18.9

## 2022-05-09 LAB — SURGICAL PCR SCREEN
MRSA, PCR: NEGATIVE
Staphylococcus aureus: NEGATIVE

## 2022-05-09 NOTE — Progress Notes (Signed)
PCP - Dr. [name]-  Cardiologist - denies. Has not seen Dr. Johnsie Cancel since 2011  PPM/ICD - n/a Device Orders - n/a Rep Notified - n/a  Chest x-ray - 04/27/22 EKG - 05/06/22 Stress Test - 11/25/03 ECHO - 05/17/21 Cardiac Cath - denies  Sleep Study - denies CPAP - n/a  Diabetes- denies  Blood Thinner Instructions: n/a Aspirin Instructions: n/a  ERAS Protcol - No. NPO PRE-SURGERY Ensure or G2- n/a  COVID TEST- n/a   Anesthesia review: Yes.   Patient denies shortness of breath, fever, cough and chest pain at PAT appointment   All instructions explained to the patient, with a verbal understanding of the material. Patient agrees to go over the instructions while at home for a better understanding. Patient also instructed to self quarantine after being tested for COVID-19. The opportunity to ask questions was provided.

## 2022-05-10 ENCOUNTER — Ambulatory Visit (HOSPITAL_COMMUNITY)
Admission: RE | Admit: 2022-05-10 | Discharge: 2022-05-10 | Disposition: A | Discharge status: Home / Self Care | Payer: Medicare Other | Source: Ambulatory Visit | Attending: Neurology | Admitting: Neurology

## 2022-05-10 DIAGNOSIS — R1310 Dysphagia, unspecified: Secondary | ICD-10-CM | POA: Diagnosis not present

## 2022-05-10 DIAGNOSIS — R1312 Dysphagia, oropharyngeal phase: Secondary | ICD-10-CM | POA: Insufficient documentation

## 2022-05-10 DIAGNOSIS — K219 Gastro-esophageal reflux disease without esophagitis: Secondary | ICD-10-CM | POA: Diagnosis not present

## 2022-05-10 DIAGNOSIS — G20 Parkinson's disease: Secondary | ICD-10-CM

## 2022-05-10 DIAGNOSIS — R059 Cough, unspecified: Secondary | ICD-10-CM | POA: Diagnosis not present

## 2022-05-10 NOTE — Progress Notes (Signed)
Anesthesia Chart Review:  History of MI during hospitalization with critical illness in 2011.  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  Heart function normalized on recheck.  Most recent echo 2022 essentially normal.  Patient seen by PCP Dr. Danise Mina 04/27/2022 for preop evaluation.  Per note, "RCRI = 0. EKG with artifact due to DBS. Anticipate adequately low risk to proceed with planned spine surgery under general anesthesia. She is not on blood thinner. Parkinson regimen to be managed by neurologist (appt next week). "  Follows with neurology for history of Parkinson's s/p bilateral [employer] STN DBS placement July 07, 2021 with IPG placement on July 14, 2021.  She takes levodopa on an as-needed basis, about once per day.  She also is a history of neurogenic orthostatic hypotension, maintained on Florinef 0.1 mg daily.  Last seen by Dr. Carles Collet on 05/01/2022, upcoming lumbar surgery was discussed.  BMP and CBC 04/26/2022 reviewed, WNL.  EKG 05/02/2022: NSR.  Rate 75.  Normal axis.  Abnormal T waves anteriorly, question artifact.  TTE 05/17/2021:  1. Left ventricular ejection fraction, by estimation, is 50 to 55%. The  left ventricle has low normal function. The left ventricle has no regional  wall motion abnormalities. Left ventricular diastolic parameters are  consistent with Grade I diastolic  dysfunction (impaired relaxation).   2. Right ventricular systolic function is normal. The right ventricular  size is normal.   3. The mitral valve is normal in structure. No evidence of mitral valve  regurgitation. No evidence of mitral stenosis.   4. The aortic valve is tricuspid. Aortic valve regurgitation is not  visualized. No aortic stenosis is present.   5. The inferior vena cava is normal in size with greater than 50%  respiratory variability, suggesting right atrial pressure of 3 mmHg.   Wynonia Musty [facility] Short Stay  Center/Anesthesiology Phone [phone] 05/10/2022 4:34 PM

## 2022-05-10 NOTE — Anesthesia Preprocedure Evaluation (Addendum)
Anesthesia Evaluation  Patient identified by MRN, date of birth, ID band Patient awake    Reviewed: Allergy & Precautions, NPO status , Patient's Chart, lab work & pertinent test results  History of Anesthesia Complications (+) PONV and history of anesthetic complications  Airway Mallampati: II  TM Distance: >3 FB Neck ROM: Full    Dental  (+) Upper Dentures, Lower Dentures   Pulmonary neg pulmonary ROS,    Pulmonary exam normal        Cardiovascular + CAD and +CHF   Rhythm:Regular Rate:Normal     Neuro/Psych  Headaches, Anxiety Depression Parkinsons  Neuromuscular disease    GI/Hepatic Neg liver ROS, GERD  ,  Endo/Other  negative endocrine ROS  Renal/GU negative Renal ROS  negative genitourinary   Musculoskeletal  (+) Arthritis , Osteoarthritis,  Fibromyalgia -Lumbar radiculopathy   Abdominal Normal abdominal exam  (+)   Peds  Hematology  (+) Blood dyscrasia, anemia ,   Anesthesia Other Findings   Reproductive/Obstetrics                            Anesthesia Physical Anesthesia Plan  ASA: 3  Anesthesia Plan: General   Post-op Pain Management:    Induction: Intravenous  PONV Risk Score and Plan: 4 or greater and Ondansetron, Dexamethasone and Treatment may vary due to age or medical condition  Airway Management Planned: Mask and Oral ETT  Additional Equipment: None  Intra-op Plan:   Post-operative Plan: Extubation in OR  Informed Consent: I have reviewed the patients History and Physical, chart, labs and discussed the procedure including the risks, benefits and alternatives for the proposed anesthesia with the patient or authorized representative who has indicated his/her understanding and acceptance.     Dental advisory given  Plan Discussed with: CRNA  Anesthesia Plan Comments: (PAT note by Karoline Caldwell, PA-C: History of MI during hospitalization with critical  illness in 2011.  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  Heart function normalized on recheck.  Most recent echo 2022 essentially normal.  Patient seen by PCP Dr. Danise Mina 04/27/2022 for preop evaluation.  Per note, "RCRI = 0. EKG with artifact due to DBS. Anticipate adequately low risk to proceed with planned spine surgery under general anesthesia. She is not on blood thinner. Parkinson regimen to be managed by neurologist (appt next week)."  Follows with neurology for history of Parkinson's s/p bilateral [employer] STN DBS placement July 07, 2021 with IPG placement on July 14, 2021.  She takes levodopa on an as-needed basis, about once per day.  She also is a history of neurogenic orthostatic hypotension, maintained on Florinef 0.1 mg daily.  Last seen by Dr. Carles Collet on 05/01/2022, upcoming lumbar surgery was discussed.  BMP and CBC 04/26/2022 reviewed, WNL.  EKG 05/02/2022: NSR.  Rate 75.  Normal axis.  Abnormal T waves anteriorly, question artifact.  TTE 05/17/2021: 1. Left ventricular ejection fraction, by estimation, is 50 to 55%. The  left ventricle has low normal function. The left ventricle has no regional  wall motion abnormalities. Left ventricular diastolic parameters are  consistent with Grade I diastolic  dysfunction (impaired relaxation).  2. Right ventricular systolic function is normal. The right ventricular  size is normal.  3. The mitral valve is normal in structure. No evidence of mitral valve  regurgitation. No evidence of mitral stenosis.  4. The aortic valve is tricuspid. Aortic valve regurgitation is not  visualized. No aortic stenosis is present.  5. The inferior vena cava is normal in size with greater than 50%  respiratory variability, suggesting right atrial pressure of 3 mmHg. )       Anesthesia Quick Evaluation

## 2022-05-10 NOTE — Therapy (Signed)
Modified Barium Swallow Progress Note  Patient Details  Name: [name] MRN: [id number] Date of Birth: 04-28-1946  Today's Date: 05/10/2022  Modified Barium Swallow completed.  Full report located under Chart Review in the Imaging Section.  Brief recommendations include the following:  Clinical Impression  Patient presents with a very mild oropharyngeal dysphagia with primary impairments being delays in anterior to posterior transit of solid texture boluses in oral cavity and mildly delayed pharyngeal peristalsis of puree and mechanical soft solids. She did exhibit mildly delayed swallow initiation of mechanical soft solids at level of vallecular sinus and trace residuals above UES opening. No other pharyngeal residuals observed with any of the tested consistencies (thin liquids, puree solids, mechanical soft solids, 1/2 barium tablet. No penetration or aspiration observed with liquids or solids. Esophageal sweep did not reveal significant amount of barium stasis and esophageal transit appeared WFL. (this is an improvement as compared to 2018 MBS). Patient would cough after most swallows in absence of any observed penetration or aspiration. She did also have a cough prior to any PO's being given and when asked most recent time she ate, she reported that she has not had anything to eat or drink since last night. SLP reviewed MBS from 2018 which reports "no airway compromise despite frequent coughing immediately post-swallow". Unfortunately, results of this MBS do not explain patient's frequent coughing that occurs immediately after swallow PO's.   Swallow Evaluation Recommendations       SLP Diet Recommendations: Regular solids;Thin liquid   Liquid Administration via: Straw;Cup   Medication Administration: Whole meds with liquid       Compensations: Minimize environmental distractions;Slow rate;Small sips/bites   Postural Changes: Seated upright at 90 degrees         Sonia Baller, MA, CCC-SLP Speech Therapy

## 2022-05-12 ENCOUNTER — Other Ambulatory Visit: Payer: Self-pay | Admitting: Family Medicine

## 2022-05-15 NOTE — Telephone Encounter (Signed)
Name of Medication: Clonazepam Name of Pharmacy: [facility] [address] Last [name] or Written Date and Quantity: 03/27/22, #45 Last Office Visit and Type: 04/26/22, pre-op eval Next Office Visit and Type: none Last Controlled Substance Agreement Date: 04/28/16 Last UDS: 04/28/16

## 2022-05-16 ENCOUNTER — Other Ambulatory Visit: Payer: Self-pay

## 2022-05-16 ENCOUNTER — Encounter (HOSPITAL_COMMUNITY)
Admission: RE | Disposition: A | Discharge status: Home / Self Care | Payer: Self-pay | Source: Ambulatory Visit | Attending: Neurological Surgery

## 2022-05-16 ENCOUNTER — Ambulatory Visit (HOSPITAL_BASED_OUTPATIENT_CLINIC_OR_DEPARTMENT_OTHER): Payer: Medicare Other | Admitting: Certified Registered"

## 2022-05-16 ENCOUNTER — Observation Stay (HOSPITAL_COMMUNITY)
Admission: RE | Admit: 2022-05-16 | Discharge: 2022-05-17 | Disposition: A | Discharge status: Home / Self Care | Payer: Medicare Other | Source: Ambulatory Visit | Attending: Neurological Surgery | Admitting: Neurological Surgery

## 2022-05-16 ENCOUNTER — Ambulatory Visit (HOSPITAL_COMMUNITY): Payer: Medicare Other

## 2022-05-16 ENCOUNTER — Encounter (HOSPITAL_COMMUNITY): Payer: Self-pay | Admitting: Neurological Surgery

## 2022-05-16 ENCOUNTER — Ambulatory Visit (HOSPITAL_COMMUNITY): Payer: Medicare Other | Admitting: Physician Assistant

## 2022-05-16 DIAGNOSIS — G20 Parkinson's disease: Secondary | ICD-10-CM | POA: Insufficient documentation

## 2022-05-16 DIAGNOSIS — F418 Other specified anxiety disorders: Secondary | ICD-10-CM | POA: Diagnosis not present

## 2022-05-16 DIAGNOSIS — I2510 Atherosclerotic heart disease of native coronary artery without angina pectoris: Secondary | ICD-10-CM | POA: Insufficient documentation

## 2022-05-16 DIAGNOSIS — M5416 Radiculopathy, lumbar region: Secondary | ICD-10-CM

## 2022-05-16 DIAGNOSIS — I509 Heart failure, unspecified: Secondary | ICD-10-CM | POA: Insufficient documentation

## 2022-05-16 DIAGNOSIS — M48061 Spinal stenosis, lumbar region without neurogenic claudication: Principal | ICD-10-CM | POA: Insufficient documentation

## 2022-05-16 DIAGNOSIS — Z79899 Other long term (current) drug therapy: Secondary | ICD-10-CM | POA: Diagnosis not present

## 2022-05-16 HISTORY — PX: LUMBAR LAMINECTOMY/DECOMPRESSION MICRODISCECTOMY: SHX5026

## 2022-05-16 HISTORY — DX: Radiculopathy, lumbar region: M54.16

## 2022-05-16 SURGERY — LUMBAR LAMINECTOMY/DECOMPRESSION MICRODISCECTOMY 1 LEVEL
Anesthesia: General | Site: Back | Laterality: Right

## 2022-05-16 MED ORDER — LIDOCAINE-EPINEPHRINE 1 %-1:100000 IJ SOLN
INTRAMUSCULAR | Status: AC
Start: 2022-05-16 — End: ?
  Filled 2022-05-16: qty 1

## 2022-05-16 MED ORDER — ONDANSETRON HCL 4 MG/2ML IJ SOLN
4.0000 mg | Freq: Four times a day (QID) | INTRAMUSCULAR | Status: DC | PRN
Start: 2022-05-16 — End: 2022-05-17

## 2022-05-16 MED ORDER — SODIUM CHLORIDE 0.9% FLUSH
3.0000 mL | INTRAVENOUS | Status: DC | PRN
Start: 2022-05-16 — End: 2022-05-17

## 2022-05-16 MED ORDER — HYDROCODONE-ACETAMINOPHEN 5-325 MG PO TABS: 1.0000 | ORAL_TABLET | ORAL | Status: DC | PRN

## 2022-05-16 MED ORDER — FENTANYL CITRATE (PF) 100 MCG/2ML IJ SOLN
25.0000 ug | INTRAMUSCULAR | Status: DC | PRN
  Administered 2022-05-16 (×2): 50 ug via INTRAVENOUS

## 2022-05-16 MED ORDER — HYDROCODONE-ACETAMINOPHEN 7.5-325 MG PO TABS
2.0000 | ORAL_TABLET | ORAL | Status: DC | PRN
  Administered 2022-05-16 – 2022-05-17 (×4): 2 via ORAL
  Filled 2022-05-16 (×4): qty 2

## 2022-05-16 MED ORDER — LIDOCAINE 2% (20 MG/ML) 5 ML SYRINGE
INTRAMUSCULAR | Status: DC | PRN
  Administered 2022-05-16: 40 mg via INTRAVENOUS

## 2022-05-16 MED ORDER — CEFAZOLIN SODIUM-DEXTROSE 2-4 GM/100ML-% IV SOLN
INTRAVENOUS | Status: AC
  Filled 2022-05-16: qty 100

## 2022-05-16 MED ORDER — SODIUM CHLORIDE 0.9 % IV SOLN
250.0000 mL | INTRAVENOUS | Status: DC
  Administered 2022-05-16: 250 mL via INTRAVENOUS

## 2022-05-16 MED ORDER — SUGAMMADEX SODIUM 200 MG/2ML IV SOLN
INTRAVENOUS | Status: DC | PRN
  Administered 2022-05-16: 200 mg via INTRAVENOUS

## 2022-05-16 MED ORDER — CLONAZEPAM 0.25 MG PO TBDP
0.2500 mg | ORAL_TABLET | Freq: Two times a day (BID) | ORAL | Status: DC | PRN
  Administered 2022-05-16: 0.25 mg via ORAL
  Filled 2022-05-16: qty 1

## 2022-05-16 MED ORDER — CLONAZEPAM 0.25 MG PO TBDP: 0.2500 mg | ORAL_TABLET | Freq: Every day | ORAL | Status: DC

## 2022-05-16 MED ORDER — METHOCARBAMOL 500 MG PO TABS
500.0000 mg | ORAL_TABLET | Freq: Four times a day (QID) | ORAL | Status: DC | PRN
  Administered 2022-05-16 – 2022-05-17 (×2): 500 mg via ORAL
  Filled 2022-05-16 (×2): qty 1

## 2022-05-16 MED ORDER — CEFAZOLIN SODIUM-DEXTROSE 2-4 GM/100ML-% IV SOLN
2.0000 g | Freq: Three times a day (TID) | INTRAVENOUS | Status: AC
  Administered 2022-05-16 – 2022-05-17 (×2): 2 g via INTRAVENOUS
  Filled 2022-05-16 (×2): qty 100

## 2022-05-16 MED ORDER — FLUDROCORTISONE ACETATE 0.1 MG PO TABS
100.0000 ug | ORAL_TABLET | Freq: Every day | ORAL | Status: DC
  Administered 2022-05-17: 100 ug via ORAL
  Filled 2022-05-16 (×2): qty 1

## 2022-05-16 MED ORDER — CARBIDOPA-LEVODOPA 25-100 MG PO TABS
1.0000 | ORAL_TABLET | Freq: Three times a day (TID) | ORAL | Status: DC
  Administered 2022-05-16: 1 via ORAL
  Filled 2022-05-16: qty 1

## 2022-05-16 MED ORDER — BUPIVACAINE LIPOSOME 1.3 % IJ SUSP
INTRAMUSCULAR | Status: AC
Start: 2022-05-16 — End: ?
  Filled 2022-05-16: qty 20

## 2022-05-16 MED ORDER — PHENYLEPHRINE HCL (PRESSORS) 10 MG/ML IV SOLN
INTRAVENOUS | Status: AC
Start: 2022-05-16 — End: ?
  Filled 2022-05-16: qty 1

## 2022-05-16 MED ORDER — MENTHOL 3 MG MT LOZG: 1.0000 | LOZENGE | OROMUCOSAL | Status: DC | PRN

## 2022-05-16 MED ORDER — ONDANSETRON HCL 4 MG PO TABS
4.0000 mg | ORAL_TABLET | Freq: Four times a day (QID) | ORAL | Status: DC | PRN
Start: 2022-05-16 — End: 2022-05-17

## 2022-05-16 MED ORDER — DOCUSATE SODIUM 100 MG PO CAPS
100.0000 mg | ORAL_CAPSULE | Freq: Two times a day (BID) | ORAL | Status: DC
  Administered 2022-05-16 – 2022-05-17 (×2): 100 mg via ORAL
  Filled 2022-05-16 (×2): qty 1

## 2022-05-16 MED ORDER — FENTANYL CITRATE (PF) 100 MCG/2ML IJ SOLN
INTRAMUSCULAR | Status: DC | PRN
  Administered 2022-05-16: 50 ug via INTRAVENOUS

## 2022-05-16 MED ORDER — BUPIVACAINE LIPOSOME 1.3 % IJ SUSP
INTRAMUSCULAR | Status: DC | PRN
  Administered 2022-05-16: 20 mL

## 2022-05-16 MED ORDER — ROCURONIUM BROMIDE 10 MG/ML (PF) SYRINGE
PREFILLED_SYRINGE | INTRAVENOUS | Status: DC | PRN
  Administered 2022-05-16: 50 mg via INTRAVENOUS
  Administered 2022-05-16: 20 mg via INTRAVENOUS

## 2022-05-16 MED ORDER — CHLORHEXIDINE GLUCONATE CLOTH 2 % EX PADS: 6.0000 | MEDICATED_PAD | Freq: Once | CUTANEOUS | Status: DC

## 2022-05-16 MED ORDER — FENTANYL CITRATE (PF) 250 MCG/5ML IJ SOLN
INTRAMUSCULAR | Status: DC | PRN
  Administered 2022-05-16 (×3): 50 ug via INTRAVENOUS
  Administered 2022-05-16: 100 ug via INTRAVENOUS

## 2022-05-16 MED ORDER — LACTATED RINGERS IV SOLN: INTRAVENOUS | Status: DC

## 2022-05-16 MED ORDER — PHENYLEPHRINE 80 MCG/ML (10ML) SYRINGE FOR IV PUSH (FOR BLOOD PRESSURE SUPPORT)
PREFILLED_SYRINGE | INTRAVENOUS | Status: DC | PRN
  Administered 2022-05-16 (×4): 80 ug via INTRAVENOUS

## 2022-05-16 MED ORDER — THROMBIN 5000 UNITS EX SOLR: OROMUCOSAL | Status: DC | PRN

## 2022-05-16 MED ORDER — THROMBIN 5000 UNITS EX SOLR
CUTANEOUS | Status: DC | PRN
  Administered 2022-05-16 (×2): 5000 [IU] via TOPICAL

## 2022-05-16 MED ORDER — 0.9 % SODIUM CHLORIDE (POUR BTL) OPTIME
TOPICAL | Status: DC | PRN
  Administered 2022-05-16: 1000 mL

## 2022-05-16 MED ORDER — PHENYLEPHRINE HCL (PRESSORS) 10 MG/ML IV SOLN
INTRAVENOUS | Status: AC
  Filled 2022-05-16: qty 1

## 2022-05-16 MED ORDER — CHLORHEXIDINE GLUCONATE 0.12 % MT SOLN
15.0000 mL | Freq: Once | OROMUCOSAL | Status: AC
Start: 2022-05-16 — End: 2022-05-16

## 2022-05-16 MED ORDER — PROPOFOL 10 MG/ML IV BOLUS
INTRAVENOUS | Status: DC | PRN
  Administered 2022-05-16: 90 mg via INTRAVENOUS

## 2022-05-16 MED ORDER — CEFAZOLIN SODIUM-DEXTROSE 2-4 GM/100ML-% IV SOLN
2.0000 g | INTRAVENOUS | Status: AC
  Administered 2022-05-16: 2 g via INTRAVENOUS

## 2022-05-16 MED ORDER — FENTANYL CITRATE (PF) 250 MCG/5ML IJ SOLN
INTRAMUSCULAR | Status: AC
  Filled 2022-05-16: qty 5

## 2022-05-16 MED ORDER — SODIUM CHLORIDE 0.9% FLUSH
3.0000 mL | Freq: Two times a day (BID) | INTRAVENOUS | Status: DC
  Administered 2022-05-16: 3 mL via INTRAVENOUS

## 2022-05-16 MED ORDER — BUPIVACAINE-EPINEPHRINE 0.5% -1:200000 IJ SOLN
INTRAMUSCULAR | Status: AC
  Filled 2022-05-16: qty 1

## 2022-05-16 MED ORDER — METHYLPREDNISOLONE ACETATE 80 MG/ML IJ SUSP
INTRAMUSCULAR | Status: AC
  Filled 2022-05-16: qty 1

## 2022-05-16 MED ORDER — FENTANYL CITRATE (PF) 100 MCG/2ML IJ SOLN
INTRAMUSCULAR | Status: AC
  Filled 2022-05-16: qty 2

## 2022-05-16 MED ORDER — LUBIPROSTONE 24 MCG PO CAPS
24.0000 ug | ORAL_CAPSULE | Freq: Two times a day (BID) | ORAL | Status: DC
Start: 2022-05-16 — End: 2022-05-17
  Filled 2022-05-16 (×3): qty 1

## 2022-05-16 MED ORDER — ONDANSETRON HCL 4 MG/2ML IJ SOLN
INTRAMUSCULAR | Status: DC | PRN
  Administered 2022-05-16: 4 mg via INTRAVENOUS

## 2022-05-16 MED ORDER — MICROFIBRILLAR COLL HEMOSTAT EX POWD
CUTANEOUS | Status: AC
Start: 2022-05-16 — End: ?
  Filled 2022-05-16: qty 5

## 2022-05-16 MED ORDER — THROMBIN 5000 UNITS EX SOLR
CUTANEOUS | Status: AC
Start: 2022-05-16 — End: ?
  Filled 2022-05-16: qty 15000

## 2022-05-16 MED ORDER — SERTRALINE HCL 100 MG PO TABS
100.0000 mg | ORAL_TABLET | Freq: Every day | ORAL | Status: DC
  Administered 2022-05-17: 100 mg via ORAL
  Filled 2022-05-16: qty 1

## 2022-05-16 MED ORDER — CHLORHEXIDINE GLUCONATE 0.12 % MT SOLN
OROMUCOSAL | Status: AC
  Administered 2022-05-16: 15 mL via OROMUCOSAL
  Filled 2022-05-16: qty 15

## 2022-05-16 MED ORDER — ACETAMINOPHEN 10 MG/ML IV SOLN: 1000.0000 mg | Freq: Once | INTRAVENOUS | Status: DC | PRN

## 2022-05-16 MED ORDER — METHOCARBAMOL 1000 MG/10ML IJ SOLN: 500.0000 mg | Freq: Four times a day (QID) | INTRAVENOUS | Status: DC | PRN

## 2022-05-16 MED ORDER — ACETAMINOPHEN 325 MG PO TABS: 650.0000 mg | ORAL_TABLET | ORAL | Status: DC | PRN

## 2022-05-16 MED ORDER — PHENYLEPHRINE HCL-NACL 20-0.9 MG/250ML-% IV SOLN
INTRAVENOUS | Status: DC | PRN
  Administered 2022-05-16: 20 ug/min via INTRAVENOUS

## 2022-05-16 MED ORDER — PHENOL 1.4 % MT LIQD: 1.0000 | OROMUCOSAL | Status: DC | PRN

## 2022-05-16 MED ORDER — HYDROMORPHONE HCL 1 MG/ML IJ SOLN
0.5000 mg | INTRAMUSCULAR | Status: DC | PRN
  Administered 2022-05-16: 0.5 mg via INTRAVENOUS
  Filled 2022-05-16: qty 0.5

## 2022-05-16 MED ORDER — ORAL CARE MOUTH RINSE
15.0000 mL | Freq: Once | OROMUCOSAL | Status: AC
Start: 2022-05-16 — End: 2022-05-16

## 2022-05-16 MED ORDER — ACETAMINOPHEN 650 MG RE SUPP: 650.0000 mg | RECTAL | Status: DC | PRN

## 2022-05-16 MED ORDER — DEXAMETHASONE SODIUM PHOSPHATE 10 MG/ML IJ SOLN
INTRAMUSCULAR | Status: DC | PRN
  Administered 2022-05-16: 10 mg via INTRAVENOUS

## 2022-05-16 MED ORDER — PHENYLEPHRINE 80 MCG/ML (10ML) SYRINGE FOR IV PUSH (FOR BLOOD PRESSURE SUPPORT)
PREFILLED_SYRINGE | INTRAVENOUS | Status: AC
Start: 2022-05-16 — End: ?
  Filled 2022-05-16: qty 10

## 2022-05-16 SURGICAL SUPPLY — 52 items
BAG COUNTER SPONGE SURGICOUNT (BAG) ×2 IMPLANT
BAND RUBBER #18 3X1/16 STRL (MISCELLANEOUS) ×4 IMPLANT
BUR CARBIDE MATCH 3.0 (BURR) ×2 IMPLANT
CNTNR URN SCR LID CUP LEK RST (MISCELLANEOUS) ×1 IMPLANT
CONT SPEC 4OZ STRL OR WHT (MISCELLANEOUS) ×2
COVER MAYO STAND STRL (DRAPES) ×2 IMPLANT
DRAIN JACKSON RD 7FR 3/32 (WOUND CARE) IMPLANT
DRAPE C-ARM 42X72 X-RAY (DRAPES) ×2 IMPLANT
DRAPE LAPAROTOMY 100X72X124 (DRAPES) ×2 IMPLANT
DRAPE MICROSCOPE LEICA (MISCELLANEOUS) ×2 IMPLANT
DRAPE SURG 17X23 STRL (DRAPES) ×2 IMPLANT
DURAPREP 26ML APPLICATOR (WOUND CARE) ×2 IMPLANT
ELECT BLADE INSULATED 4IN (ELECTROSURGICAL) ×2
ELECT REM PT RETURN 9FT ADLT (ELECTROSURGICAL) ×2
ELECTRODE BLADE INSULATED 4IN (ELECTROSURGICAL) ×1 IMPLANT
ELECTRODE REM PT RTRN 9FT ADLT (ELECTROSURGICAL) ×1 IMPLANT
EVACUATOR 1/8 PVC DRAIN (DRAIN) IMPLANT
GAUZE 4X4 16PLY ~~LOC~~+RFID DBL (SPONGE) IMPLANT
GAUZE SPONGE 4X4 12PLY STRL (GAUZE/BANDAGES/DRESSINGS) ×2 IMPLANT
GLOVE BIOGEL PI IND STRL 8 (GLOVE) ×1 IMPLANT
GLOVE BIOGEL PI INDICATOR 8 (GLOVE) ×1
GLOVE ECLIPSE 8.0 STRL XLNG CF (GLOVE) ×2 IMPLANT
GLOVE SURG ENC MOIS LTX SZ8 (GLOVE) ×2 IMPLANT
GLOVE SURG UNDER POLY LF SZ8.5 (GLOVE) ×2 IMPLANT
GOWN STRL REUS W/ TWL LRG LVL3 (GOWN DISPOSABLE) IMPLANT
GOWN STRL REUS W/ TWL XL LVL3 (GOWN DISPOSABLE) ×2 IMPLANT
GOWN STRL REUS W/TWL 2XL LVL3 (GOWN DISPOSABLE) IMPLANT
GOWN STRL REUS W/TWL LRG LVL3 (GOWN DISPOSABLE)
GOWN STRL REUS W/TWL XL LVL3 (GOWN DISPOSABLE) ×4
HEMOSTAT POWDER KIT SURGIFOAM (HEMOSTASIS) ×2 IMPLANT
KIT BASIN OR (CUSTOM PROCEDURE TRAY) ×2 IMPLANT
KIT POSITION SURG JACKSON T1 (MISCELLANEOUS) ×2 IMPLANT
KIT TURNOVER KIT B (KITS) ×2 IMPLANT
MARKER SKIN DUAL TIP RULER LAB (MISCELLANEOUS) ×2 IMPLANT
NDL HYPO 25X1 1.5 SAFETY (NEEDLE) ×1 IMPLANT
NEEDLE HYPO 25X1 1.5 SAFETY (NEEDLE) ×2 IMPLANT
NS IRRIG 1000ML POUR BTL (IV SOLUTION) ×2 IMPLANT
PACK LAMINECTOMY NEURO (CUSTOM PROCEDURE TRAY) ×2 IMPLANT
PAD ARMBOARD 7.5X6 YLW CONV (MISCELLANEOUS) ×6 IMPLANT
PATTIES SURGICAL .5 X.5 (GAUZE/BANDAGES/DRESSINGS) IMPLANT
PATTIES SURGICAL .5 X1 (DISPOSABLE) IMPLANT
PATTIES SURGICAL 1X1 (DISPOSABLE) IMPLANT
SPIKE FLUID TRANSFER (MISCELLANEOUS) ×2 IMPLANT
SPONGE SURGIFOAM ABS GEL SZ50 (HEMOSTASIS) ×2 IMPLANT
SPONGE T-LAP 4X18 ~~LOC~~+RFID (SPONGE) IMPLANT
SUT VIC AB 0 CT1 18XCR BRD8 (SUTURE) ×1 IMPLANT
SUT VIC AB 0 CT1 8-18 (SUTURE) ×2
SUT VIC AB 2-0 CP2 18 (SUTURE) ×2 IMPLANT
SUT VIC AB 3-0 SH 8-18 (SUTURE) ×2 IMPLANT
TOWEL GREEN STERILE (TOWEL DISPOSABLE) IMPLANT
TOWEL GREEN STERILE FF (TOWEL DISPOSABLE) IMPLANT
WATER STERILE IRR 1000ML POUR (IV SOLUTION) ×2 IMPLANT

## 2022-05-16 NOTE — Transfer of Care (Signed)
Immediate Anesthesia Transfer of Care Note  Patient: CATALINA SALASAR  Procedure(s) Performed: OPEN LUMBAR LAMINECTOMY, RT, L45 W/LATERAL RECESS DECOMPRESSION (Right: Back)  Patient Location: PACU  Anesthesia Type:General  Level of Consciousness: awake, alert  and oriented  Airway & Oxygen Therapy: Patient Spontanous Breathing and Patient connected to nasal cannula oxygen  Post-op Assessment: Report given to RN, Post -op Vital signs reviewed and stable and Patient moving all extremities  Post vital signs: Reviewed and stable  Last Vitals:  Vitals Value Taken Time  BP 140/71 05/16/22 1326  Temp    Pulse 80 05/16/22 1328  Resp 14 05/16/22 1328  SpO2 94 % 05/16/22 1328  Vitals shown include unvalidated device data.  Last Pain:  Vitals:   05/16/22 0926  TempSrc:   PainSc: 9       Patients Stated Pain Goal: 3 (32/44/01 0272)  Complications: No notable events documented.

## 2022-05-16 NOTE — Anesthesia Procedure Notes (Signed)
Procedure Name: Intubation Date/Time: 05/16/2022 11:20 AM  Performed by: Amadeo Garnet, CRNAPre-anesthesia Checklist: Patient identified, Emergency Drugs available, Suction available and Patient being monitored Patient Re-evaluated:Patient Re-evaluated prior to induction Oxygen Delivery Method: Circle system utilized Preoxygenation: Pre-oxygenation with 100% oxygen Induction Type: IV induction Ventilation: Mask ventilation without difficulty Laryngoscope Size: 4 and Mac Grade View: Grade I Tube type: Oral Tube size: 7.0 mm Number of attempts: 1 Airway Equipment and Method: Stylet and Oral airway Placement Confirmation: ETT inserted through vocal cords under direct vision, positive ETCO2 and breath sounds checked- equal and bilateral Secured at: 22 cm Tube secured with: Tape Dental Injury: Teeth and Oropharynx as per pre-operative assessment

## 2022-05-16 NOTE — Op Note (Signed)
  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 Pieter Partridge C. Mikko Lewellen, DO   ASSISTANT: Consuella Lose, MD   ANESTHESIA: General Endotracheal   EBL: 25 cc   SPECIMENS: None   DRAINS: None   COMPLICATIONS: None   CONDITION: Hemodynamically stable   HISTORY: [name] is a 76 y.o. female with a history of right L5 radiculopathy that was resistant to conservative measures.  She did have a history of a right L4-5 lateral recess decompression however MRI revealed recurrent severe lateral recess stenosis due to broad-based disc bulging and ligamentum and facet hypertrophy.  She failed multiple epidural steroid injections and therefore I offered her a right L4-5 lateral recess decompression at L4-5 laminectomy.  We discussed all risks benefits and expected outcomes, informed consent was obtained.   PROCEDURE IN DETAIL: The patient was brought to the operating room. After induction of general anesthesia, the patient was positioned on the operative table in the prone position. All pressure points were meticulously padded. Skin incision was then marked out and prepped and draped in the usual sterile fashion.   Using a 10 blade, midline incision was created over the L4, L5 spinous processes.  Using Bovie electrocautery soft tissue dissection was performed down to the lumbodorsal fascia.  Subperiosteal dissection was performed bilaterally with Bovie electrocautery exposing the L4, L5 lamina bilaterally.  Deep retractors placed in the wound.  Lateral fluoroscopy confirmed the  appropriate level.  The microscope was sterilely draped and brought into the field.  Using Leksell rongeur, the spinous process of L4, L5 were removed down to the lamina.  Using a high-speed drill, the L4 lamina was removed bilaterally to the lateral recess down to the ligamentum flavum and superiorly up to the ligamentous attachment bilaterally.  Using high-speed drill, a partial bilateral facetectomy was performed down to the ligamentum flavum on the right.  The ligamentum flavum was then gently dissected from the epidural space and resected with a series of Kerrison rongeurs to the lateral recess bilaterally.  There was significant epidural scarring noted along the right L5 nerve root due to prior surgery.  This was gently dissected laterally and inferiorly.  Using a ball-tipped probe, right lateral recess appeared decompressed.  The thecal sac was pulsatile.  The right L4-5 foramen was also palpated with a ball-tipped probe and noted to be adequately decompressed.  Epidural hemostasis was achieved with Surgifoam.  Gelfoam with fentanyl was placed in the epidural space. Deep retractor was taken out of the wound.  Hemostasis was achieved with bipolar cautery and the soft tissues.  The wound was closed in layers with 0 Vicryl sutures for muscle and fascia.  Dermis was closed with 2-0 and 3-0 Vicryl sutures.  Skin was closed with skin glue.  Sterile dressing was applied.   At the end of the case all sponge, needle, and instrument counts were correct. The patient was then transferred to the stretcher, extubated, and taken to the post-anesthesia care unit in stable hemodynamic condition.

## 2022-05-16 NOTE — Anesthesia Postprocedure Evaluation (Signed)
Anesthesia Post Note  Patient: [name]  Procedure(s) Performed: OPEN LUMBAR LAMINECTOMY, RT, L45 W/LATERAL RECESS DECOMPRESSION (Right: Back)     Patient location during evaluation: PACU Anesthesia Type: General Level of consciousness: awake and alert Pain management: pain level controlled Vital Signs Assessment: post-procedure vital signs reviewed and stable Respiratory status: spontaneous breathing, nonlabored ventilation, respiratory function stable and patient connected to nasal cannula oxygen Cardiovascular status: blood pressure returned to baseline and stable Postop Assessment: no apparent nausea or vomiting Anesthetic complications: no   No notable events documented.  Last Vitals:  Vitals:   05/16/22 1441 05/16/22 1503  BP: 129/66 133/78  Pulse: 74 86  Resp: 16 18  Temp: 36.6 C   SpO2: 95% 94%    Last Pain:  Vitals:   05/16/22 1324  TempSrc:   PainSc: 0-No pain                 Belenda Cruise P Brookie Wayment

## 2022-05-16 NOTE — H&P (Signed)
Providing Compassionate, Quality Care - Together  NEUROSURGERY HISTORY & PHYSICAL   [name] is an 76 y.o. female.   Chief Complaint: Right lower extremity radiculopathy HPI: This is a 76 year old female with a history of Parkinson disease, status post bilateral DBS placement, with complaints of continued right lower extremity L5 radiculopathy.  She failed multiple conservative measures including epidural steroid injections.  Her pain is 8/10, causing her difficulty walking due to this.  MRI revealed severe stenosis at L4-5 with severe lateral recess stenosis and L5 compression.  She presents today for surgical intervention.  She received medical clearance.  Past Medical History:  Diagnosis Date   Allergy    ANEMIA-NOS 09/25/2007   Anxiety    Arthritis    B12 DEFICIENCY 05/03/2007   Blood transfusion without reported diagnosis    CAD (coronary artery disease) 01/2010   MI, Nishan   Cardiomyopathy ([place]) 02/08/2010   H/o this 2012 after urosepsis, no recurrence.    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   OSTEOPOROSIS 08/2009   bisphosphonate on hold 2/2 dysphagia, on reclast done in August each year   Parkinson's disease ([place]) 08/25/2015   Dx Dr [name] 07/2015    Pneumonia    PONV (postoperative nausea and vomiting)    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   Takotsubo cardiomyopathy 2008   due to E coli urosepsis    Past Surgical  History:  Procedure Laterality Date   ABDOMINAL HYSTERECTOMY  1970s   IUD infection - first partial then with oophorectomy (cysts), complication - low blood pressure   CATARACT EXTRACTION Bilateral    CHOLECYSTECTOMY     complication - low blood pressure   COLONOSCOPY  06/2008   h/o polyps but latest WNL, rec rpt 10 yrs Olevia Perches)   COLONOSCOPY  11/2018   multiple TAs (10 polyps total), rpt 2 yrs Fuller Plan)   COLONOSCOPY  04/2021   multiple TAs, rpt 3 yrs Fuller Plan)   CYSTOSCOPY  12/2013   abx treatment for recurrent cystitis   DEXA  04/2013   T -2.9 @ femur, -1.6 @ spine   DEXA  04/2017   T -2.9 hip, -0.7 spine   ESOPHAGOGASTRODUODENOSCOPY  12/2017   WNL, regardless esophagus dilated, small HH Fuller Plan)   EYE SURGERY     LUMBAR LAMINECTOMY/DECOMPRESSION MICRODISCECTOMY Right 11/27/2019   Right Lumbar Four-Five foraminotomy;  Erline Levine, MD)   MINOR PLACEMENT OF FIDUCIAL N/A 06/30/2021   Procedure: MINOR PLACEMENT OF FIDUCIAL;  Surgeon: Erline Levine, MD;  Location: [place];  Service: Neurosurgery;  Laterality: N/A;  Minor room   PTOSIS REPAIR Bilateral 10/2020   Plastic Surgery   PULSE GENERATOR IMPLANT Right 07/14/2021   Procedure: UNILATERAL PULSE GENERATOR IMPLANT;  Surgeon: Erline Levine, MD;  Location: [place];  Service: Neurosurgery;  Laterality: Right;   SUBTHALAMIC STIMULATOR INSERTION Bilateral 07/07/2021   Procedure:  Deep brain stimulator placement;  Surgeon: Erline Levine, MD;  Location: [place];  Service: Neurosurgery;  Laterality: Bilateral;   TUBAL LIGATION     UPPER GASTROINTESTINAL ENDOSCOPY      Family History  Problem Relation Age of Onset   Heart attack Father    Diabetes Father    Prostate cancer Father    Esophageal cancer Mother    Lung cancer Brother    Breast cancer Sister    Ovarian cancer Sister    Lung cancer Brother    CAD Brother    Uterine cancer Sister    Clotting disorder Son    Healthy Son    Healthy Daughter    Healthy Daughter    Colon  cancer Neg Hx    Rectal cancer Neg Hx    Stomach cancer Neg Hx    Social History:  reports that she has never smoked. She has never used smokeless tobacco. She reports that she does not currently use drugs. She reports that she does not drink alcohol.  Allergies:  Allergies  Allergen Reactions   Iohexol Hives and Shortness Of Breath     Code: HIVES, Desc: PT developed 2 hives, followed by SOB, severe headache post 87cc's Omnipaque 300., Onset Date: [id number]    Amitriptyline Other (See Comments)    Sedated next morning   Ciprofloxacin Nausea And Vomiting   Cymbalta [Duloxetine Hcl] Other (See Comments)    Worsened depression   Lyrica [Pregabalin] Other (See Comments)    Tried during hospitalization - unsure effects but unable to tolerate   Imipramine Hcl Rash   Iodine Rash   Lidocaine Hcl Rash   Morphine Sulfate Rash   Neosporin [Neomycin-Bacitracin Zn-Polymyx] Rash and Other (See Comments)    Worsened skin breaking out   Sulfamethoxazole Rash   Tetracyclines & Related Rash    Medications Prior to Admission  Medication Sig Dispense Refill   acetaminophen (TYLENOL) 650 MG CR tablet Take 2 tablets (1,300 mg total) by mouth every 8 (eight) hours as needed for pain.     Calcium Carbonate-Vitamin D (CALCIUM 600/VITAMIN D) 600-400 MG-UNIT chew tablet Chew 1 tablet by mouth daily.     carbidopa-levodopa (SINEMET IR) 25-100 MG tablet 1 po tid prn (Patient taking differently: Take 1 tablet by mouth in the morning and at bedtime.) 270 tablet 1   Cholecalciferol (VITAMIN D3) 25 MCG (1000 UT) CAPS Take 1 capsule (1,000 Units total) by mouth daily. 30 capsule    clonazePAM (KLONOPIN) 0.5 MG tablet Take 0.5 tablets (0.25 mg total) by mouth every morning AND 1 tablet (0.5 mg total) at bedtime. 45 tablet 0   cyanocobalamin (,VITAMIN B-12,) 1000 MCG/ML injection Inject 1 mL (1,000 mcg total) into the muscle every 30 (thirty) days. 3 mL 2   fludrocortisone (FLORINEF) 0.1 MG tablet TAKE ONE TABLET  BY MOUTH DAILY 30 tablet 1   Glycerin-Hypromellose-PEG 400 (ARTIFICIAL TEARS) 0.2-0.2-1 % SOLN Place 1 drop into both eyes daily as needed (dry eyes).     latanoprost (XALATAN) 0.005 % ophthalmic solution Place 1 drop into both eyes at bedtime.     sertraline (ZOLOFT) 100 MG tablet Take 1 tablet (100 mg total) by mouth daily. 90 tablet 1   traMADol (ULTRAM) 50 MG tablet Take 1 tablet (50 mg total) by mouth every 6 (six) hours as needed for moderate pain. 30 tablet 0   AMBULATORY NON FORMULARY MEDICATION Lift chair Dx:  G20 1 Device 0   clotrimazole-betamethasone (LOTRISONE) cream Apply 1 application  topically daily. (Patient not taking: Reported on 05/02/2022) 30 g 0   denosumab (PROLIA) 60 MG/ML SOSY injection Inject 60 mg into the skin every 6 (six) months.     lubiprostone (AMITIZA) 24 MCG capsule Take 1 capsule (24 mcg total) by mouth 2 (two) times daily with a meal. (Patient not taking: Reported on 05/01/2022) 60 capsule 6   SYRINGE-NEEDLE, DISP, 3 ML (BD SAFETYGLIDE SYRINGE/NEEDLE) 25G X 1" 3 ML MISC Use to inject vitamin B12 monthly. 50 each 0    No results found. However, due to the size of the patient record, not all encounters were searched. Please check Results Review for a complete set of results. No results found.  ROS All pertinent positives and negatives are listed in HPI above  Blood pressure 137/67, pulse 71, temperature 97.7 F (36.5 C), temperature source Oral, resp. rate 17, height '5\' 5"'$  (1.651 m), weight 72.6 kg, SpO2 98 %. Physical Exam  Awake alert oriented, no acute distress PERRLA Cranial nerves II through XII intact Speech fluent and appropriate Bilateral upper extremity full strength Left lower extremity full strength Right lower extremity full strength except for DF/TA 4/5 Decreased sensation light touch in the L5 distribution of the right lower extremity  Assessment/Plan 76 year old female with  L4-5 stenosis, with right L5 radiculopathy  -Or today for  L4-5 laminectomy, right lateral recess decompression.  We discussed all risks, benefits and expected outcomes as well as alternatives to treatment.  Informed consent was obtained.  Patient was medically cleared.  Her DBS device was turned off at bedside.  Thank you for allowing me to participate in this patient's care.  Please do not hesitate to call with questions or concerns.   Elwin Sleight, [facility] Cell: [phone]

## 2022-05-17 ENCOUNTER — Encounter (HOSPITAL_COMMUNITY): Payer: Self-pay | Admitting: Neurological Surgery

## 2022-05-17 ENCOUNTER — Ambulatory Visit: Payer: Medicare Other

## 2022-05-17 ENCOUNTER — Other Ambulatory Visit: Payer: Self-pay

## 2022-05-17 ENCOUNTER — Other Ambulatory Visit: Payer: Medicare Other

## 2022-05-17 DIAGNOSIS — Z79899 Other long term (current) drug therapy: Secondary | ICD-10-CM | POA: Diagnosis not present

## 2022-05-17 DIAGNOSIS — M5416 Radiculopathy, lumbar region: Secondary | ICD-10-CM | POA: Diagnosis not present

## 2022-05-17 DIAGNOSIS — I2510 Atherosclerotic heart disease of native coronary artery without angina pectoris: Secondary | ICD-10-CM | POA: Diagnosis not present

## 2022-05-17 DIAGNOSIS — G20 Parkinson's disease: Secondary | ICD-10-CM | POA: Diagnosis not present

## 2022-05-17 DIAGNOSIS — M48061 Spinal stenosis, lumbar region without neurogenic claudication: Secondary | ICD-10-CM | POA: Diagnosis not present

## 2022-05-17 DIAGNOSIS — I509 Heart failure, unspecified: Secondary | ICD-10-CM | POA: Diagnosis not present

## 2022-05-17 MED ORDER — HYDROCODONE-ACETAMINOPHEN 5-325 MG PO TABS: 1.0000 | ORAL_TABLET | ORAL | 0 refills | Status: DC | PRN

## 2022-05-17 MED ORDER — METHOCARBAMOL 500 MG PO TABS: 500.0000 mg | ORAL_TABLET | Freq: Four times a day (QID) | ORAL | 1 refills | Status: DC | PRN

## 2022-05-17 NOTE — Discharge Summary (Signed)
Physician Discharge Summary  Patient ID: [name] MRN: [id number] DOB/AGE: 74-Aug-1947 74 y.o.  Admit date: 05/16/2022 Discharge date: 05/17/2022  Admission Diagnoses:  Right L5 radiculopathy due to L4-5 stenosis and lateral recess stenosis   Discharge Diagnoses:  Same Principal Problem:   Lumbar radiculopathy, chronic   Discharged Condition: Stable  Hospital Course:  [name] is a 76 y.o. female that underwent a right L4-5 lateral recess decompression, L4-5 laminectomy and decompression of the L5 nerve root.  She tolerated the surgery well.  Postoperatively her preoperative radicular pain improved.  She was ambulating independently.  Her pain was controlled on oral medications, having normal bowel bladder function.  She was tolerating a normal diet.  Her incision was clean dry and intact.  Treatments: Surgery -L4-5 laminectomy, right lateral recess decompression on 05/16/2022  Discharge Exam: Blood pressure 109/67, pulse 78, temperature 97.8 F (36.6 C), temperature source Oral, resp. rate 16, height '5\' 5"'$  (1.651 m), weight 72.6 kg, SpO2 96 %. Awake, alert, oriented x3 Speech fluent, appropriate CN grossly intact 5/5 BUE/BLE Wound c/d/I Sensory intact to light touch  Disposition: Discharge disposition: 01-Home or Self Care       Discharge Instructions     Incentive spirometry RT   Complete by: As directed       Allergies as of 05/17/2022       Reactions   Iohexol Hives, Shortness Of Breath    Code: HIVES, Desc: PT developed 2 hives, followed by SOB, severe headache post 87cc's Omnipaque 300., Onset Date: [id number]   Amitriptyline Other (See Comments)   Sedated next morning   Ciprofloxacin Nausea And Vomiting   Cymbalta [duloxetine Hcl] Other (See Comments)   Worsened depression   Lyrica [pregabalin] Other (See Comments)   Tried during hospitalization - unsure effects but unable to tolerate   Imipramine Hcl Rash   Iodine Rash   Lidocaine Hcl Rash    Morphine Sulfate Rash   Neosporin [neomycin-bacitracin Zn-polymyx] Rash, Other (See Comments)   Worsened skin breaking out   Sulfamethoxazole Rash   Tetracyclines & Related Rash        Medication List     STOP taking these medications    traMADol 50 MG tablet Commonly known as: ULTRAM       TAKE these medications    acetaminophen 650 MG CR tablet Commonly known as: TYLENOL Take 2 tablets (1,300 mg total) by mouth every 8 (eight) hours as needed for pain.   AMBULATORY NON FORMULARY MEDICATION Lift chair Dx:  G20   Artificial Tears 0.2-0.2-1 % Soln Generic drug: Glycerin-Hypromellose-PEG 400 Place 1 drop into both eyes daily as needed (dry eyes).   BD SafetyGlide Syringe/Needle 25G X 1" 3 ML Misc Generic drug: SYRINGE-NEEDLE (DISP) 3 ML Use to inject vitamin B12 monthly.   Calcium Carbonate-Vitamin D 600-400 MG-UNIT chew tablet Commonly known as: Calcium 600/Vitamin D Chew 1 tablet by mouth daily.   carbidopa-levodopa 25-100 MG tablet Commonly known as: SINEMET IR 1 po tid prn What changed:  how much to take how to take this when to take this additional instructions   clonazePAM 0.5 MG tablet Commonly known as: KLONOPIN Take 0.5 tablets (0.25 mg total) by mouth every morning AND 1 tablet (0.5 mg total) at bedtime.   clotrimazole-betamethasone cream Commonly known as: LOTRISONE Apply 1 application topically daily.   cyanocobalamin 1000 MCG/ML injection Commonly known as: (VITAMIN B-12) Inject 1 mL (1,000 mcg total) into the muscle every 30 (thirty) days.   denosumab  60 MG/ML Sosy injection Commonly known as: PROLIA Inject 60 mg into the skin every 6 (six) months.   fludrocortisone 0.1 MG tablet Commonly known as: FLORINEF TAKE ONE TABLET BY MOUTH DAILY   HYDROcodone-acetaminophen 5-325 MG tablet Commonly known as: NORCO/VICODIN Take 1 tablet by mouth every 4 (four) hours as needed for moderate pain ((score 4 to 6)).   latanoprost 0.005 %  ophthalmic solution Commonly known as: XALATAN Place 1 drop into both eyes at bedtime.   lubiprostone 24 MCG capsule Commonly known as: AMITIZA Take 1 capsule (24 mcg total) by mouth 2 (two) times daily with a meal.   methocarbamol 500 MG tablet Commonly known as: ROBAXIN Take 1 tablet (500 mg total) by mouth every 6 (six) hours as needed for muscle spasms.   sertraline 100 MG tablet Commonly known as: ZOLOFT Take 1 tablet (100 mg total) by mouth daily.   Vitamin D3 25 MCG (1000 UT) Caps Take 1 capsule (1,000 Units total) by mouth daily.        Follow-up Information     Ludie Hudon C, DO Follow up in 3 week(s).   Contact information: [address] [place] [address] [phone]                 Signed: Theodoro Doing [name] 05/17/2022, 8:28 AM

## 2022-05-17 NOTE — Telephone Encounter (Signed)
ERx 

## 2022-05-17 NOTE — Plan of Care (Signed)
  Problem: Education: Goal: Knowledge of General Education information will improve Description: Including pain rating scale, medication(s)/side effects and non-pharmacologic comfort measures Outcome: Completed/Met   Problem: Health Behavior/Discharge Planning: Goal: Ability to manage health-related needs will improve Outcome: Completed/Met   Problem: Clinical Measurements: Goal: Ability to maintain clinical measurements within normal limits will improve Outcome: Completed/Met Goal: Will remain free from infection Outcome: Completed/Met Goal: Diagnostic test results will improve Outcome: Completed/Met Goal: Respiratory complications will improve Outcome: Completed/Met Goal: Cardiovascular complication will be avoided Outcome: Completed/Met   Problem: Activity: Goal: Risk for activity intolerance will decrease Outcome: Completed/Met   Problem: Nutrition: Goal: Adequate nutrition will be maintained Outcome: Completed/Met   Problem: Coping: Goal: Level of anxiety will decrease Outcome: Completed/Met   Problem: Elimination: Goal: Will not experience complications related to bowel motility Outcome: Completed/Met Goal: Will not experience complications related to urinary retention Outcome: Completed/Met   Problem: Pain Managment: Goal: General experience of comfort will improve Outcome: Completed/Met   Problem: Safety: Goal: Ability to remain free from injury will improve Outcome: Completed/Met   Problem: Skin Integrity: Goal: Risk for impaired skin integrity will decrease Outcome: Completed/Met   Problem: Education: Goal: Ability to verbalize activity precautions or restrictions will improve Outcome: Completed/Met Goal: Knowledge of the prescribed therapeutic regimen will improve Outcome: Completed/Met Goal: Understanding of discharge needs will improve Outcome: Completed/Met   Problem: Activity: Goal: Ability to avoid complications of mobility impairment will  improve Outcome: Completed/Met Goal: Ability to tolerate increased activity will improve Outcome: Completed/Met Goal: Will remain free from falls Outcome: Completed/Met   Problem: Bowel/Gastric: Goal: Gastrointestinal status for postoperative course will improve Outcome: Completed/Met   Problem: Clinical Measurements: Goal: Ability to maintain clinical measurements within normal limits will improve Outcome: Completed/Met Goal: Postoperative complications will be avoided or minimized Outcome: Completed/Met Goal: Diagnostic test results will improve Outcome: Completed/Met   Problem: Pain Management: Goal: Pain level will decrease Outcome: Completed/Met   Problem: Skin Integrity: Goal: Will show signs of wound healing Outcome: Completed/Met   Problem: Health Behavior/Discharge Planning: Goal: Identification of resources available to assist in meeting health care needs will improve Outcome: Completed/Met   Problem: Bladder/Genitourinary: Goal: Urinary functional status for postoperative course will improve Outcome: Completed/Met   Problem: Education: Goal: Ability to verbalize activity precautions or restrictions will improve Outcome: Completed/Met Goal: Knowledge of the prescribed therapeutic regimen will improve Outcome: Completed/Met Goal: Understanding of discharge needs will improve Outcome: Completed/Met   Problem: Activity: Goal: Ability to avoid complications of mobility impairment will improve Outcome: Completed/Met Goal: Ability to tolerate increased activity will improve Outcome: Completed/Met Goal: Will remain free from falls Outcome: Completed/Met   Problem: Bowel/Gastric: Goal: Gastrointestinal status for postoperative course will improve Outcome: Completed/Met   Problem: Clinical Measurements: Goal: Ability to maintain clinical measurements within normal limits will improve Outcome: Completed/Met Goal: Postoperative complications will be avoided or  minimized Outcome: Completed/Met Goal: Diagnostic test results will improve Outcome: Completed/Met   Problem: Pain Management: Goal: Pain level will decrease Outcome: Completed/Met   Problem: Skin Integrity: Goal: Will show signs of wound healing Outcome: Completed/Met   Problem: Health Behavior/Discharge Planning: Goal: Identification of resources available to assist in meeting health care needs will improve Outcome: Completed/Met   Problem: Bladder/Genitourinary: Goal: Urinary functional status for postoperative course will improve Outcome: Completed/Met   

## 2022-05-17 NOTE — Progress Notes (Signed)
Patient transported to her vehicle via wheelchair by volunteer for discharge home; in no acute distress nor complaints of pain nor discomfort; moves all extremities well; incision on her lower back with honeycomb dressing and is clean; room was checked for all her belongings; discharge instructions concerning her medications, incision care, follow up appointment and when to call the doctor as needed were all discussed with patient by RN and she expressed understanding on the instructions given.

## 2022-05-17 NOTE — Evaluation (Signed)
Occupational Therapy Evaluation Patient Details Name: [name] MRN: [id number] DOB: Oct 14, 1946 Today's Date: 05/17/2022   History of Present Illness 76 yo F s/p L4-L5 decompression.  PMH includes: DBS, anxiety, arthritis, CAD, HTN, Lupus, Parkinson's.   Clinical Impression   Patient admitted for the diagnosis above.  PTA she lives with her son, DIL, and grandchildren.  Post surgery, she will stay with her sister, who can provide any needed assist.  Patient presents with expected post surgical discomfort, but states R leg pain has improved.  She is very close to her baseline, and OT recommends use of 4WRW while she recovers from surgery.  Precaution sheet issued and reviewed.  All questions answered, and no further OT needs in the acute setting.  No post acute OT needs.        Recommendations for follow up therapy are one component of a multi-disciplinary discharge planning process, led by the attending physician.  Recommendations may be updated based on patient status, additional functional criteria and insurance authorization.   Follow Up Recommendations  No OT follow up    Assistance Recommended at Discharge Set up Supervision/Assistance  Patient can return home with the following Assist for transportation;Help with stairs or ramp for entrance    Functional Status Assessment  Patient has not had a recent decline in their functional status  Equipment Recommendations  None recommended by OT    Recommendations for Other Services       Precautions / Restrictions Precautions Precautions: Back Precaution Booklet Issued: Yes (comment) Precaution Comments: reviewed Restrictions Weight Bearing Restrictions: No      Mobility Bed Mobility Overal bed mobility: Modified Independent                  Transfers Overall transfer level: Modified independent Equipment used: Rolling walker (2 wheels)               General transfer comment: decreased balance noted  without RW.  Patient plans to use 4WRW      Balance Overall balance assessment: Needs assistance Sitting-balance support: Feet supported Sitting balance-Leahy Scale: Good     Standing balance support: Reliant on assistive device for balance Standing balance-Leahy Scale: Fair                             ADL either performed or assessed with clinical judgement   ADL Overall ADL's : At baseline                                             Vision Baseline Vision/History: 1 Wears glasses Patient Visual Report: No change from baseline       Perception Perception Perception: Not tested   Praxis Praxis Praxis: Not tested    Pertinent Vitals/Pain Pain Assessment Pain Assessment: Faces Faces Pain Scale: Hurts a little bit Pain Location: low back Pain Descriptors / Indicators: Tender Pain Intervention(s): Monitored during session     Hand Dominance Right   Extremity/Trunk Assessment Upper Extremity Assessment Upper Extremity Assessment: Overall WFL for tasks assessed   Lower Extremity Assessment Lower Extremity Assessment: Overall WFL for tasks assessed   Cervical / Trunk Assessment Cervical / Trunk Assessment: Back Surgery   Communication Communication Communication: No difficulties   Cognition Arousal/Alertness: Awake/alert Behavior During Therapy: WFL for tasks assessed/performed Overall Cognitive Status: Within Functional Limits for  tasks assessed                                       General Comments   Patient declines PT eval.  OT advised PT can screen.      Exercises     Shoulder Instructions      Home Living Family/patient expects to be discharged to:: Private residence Living Arrangements: Children;Other relatives Available Help at Discharge: Family;Available 24 hours/day Type of Home: House Home Access: Stairs to enter [employer] of Steps: 1 Entrance Stairs-Rails: Left;Right;Can reach  both Home Layout: One level     Bathroom Shower/Tub: Occupational psychologist: Standard Bathroom Accessibility: Yes How Accessible: Accessible via walker Home Equipment: Rollator (4 wheels);Cane - single point;Hand held shower head;Grab bars - tub/shower;Shower seat   Additional Comments: Info is for sister's home      Prior Functioning/Environment Prior Level of Function : Independent/Modified Independent                        OT Problem List: Impaired balance (sitting and/or standing)      OT Treatment/Interventions:      OT Goals(Current goals can be found in the care plan section) Acute Rehab OT Goals Patient Stated Goal: Return home today OT Goal Formulation: With patient Time For Goal Achievement: 05/22/22 Potential to Achieve Goals: Good  OT Frequency:      Co-evaluation              AM-PAC OT "6 Clicks" Daily Activity     Outcome Measure Help from another person eating meals?: None Help from another person taking care of personal grooming?: None Help from another person toileting, which includes using toliet, bedpan, or urinal?: None Help from another person bathing (including washing, rinsing, drying)?: None Help from another person to put on and taking off regular upper body clothing?: None Help from another person to put on and taking off regular lower body clothing?: None 6 Click Score: 24   End of Session Equipment Utilized During Treatment: Rolling walker (2 wheels) Nurse Communication: Mobility status  Activity Tolerance: Patient tolerated treatment well Patient left: in chair;with call bell/phone within reach  OT Visit Diagnosis: Unsteadiness on feet (R26.81)                Time: 6122-4497 OT Time Calculation (min): 27 min Charges:  OT General Charges $OT Visit: 1 Visit OT Evaluation $OT Eval Moderate Complexity: 1 Mod OT Treatments $Self Care/Home Management : 8-22 mins  05/17/2022  RP, OTR/L  Acute Rehabilitation  Services  Office:  [phone]   Metta Clines 05/17/2022, 8:54 AM

## 2022-05-17 NOTE — Progress Notes (Signed)
PT Cancellation Note  Patient Details Name: [name] MRN: [id number] DOB: [birth year]   Cancelled Treatment:    Reason Eval/Treat Not Completed: PT screened, no needs identified, will sign off Per OT, no skilled PT needs. Will sign off. If needs change, please re-consult.   Lou Miner, DPT  Acute Rehabilitation Services  Office: [phone]    [name] 05/17/2022, 8:48 AM

## 2022-05-20 ENCOUNTER — Other Ambulatory Visit: Payer: Self-pay | Admitting: Family Medicine

## 2022-05-24 NOTE — Telephone Encounter (Signed)
ERx 

## 2022-06-05 ENCOUNTER — Telehealth: Payer: Self-pay

## 2022-06-05 NOTE — Chronic Care Management (AMB) (Signed)
Chronic Care Management Pharmacy Assistant   Name: [name]  MRN: [id number] DOB: 1946-05-19   Reason for Encounter: General Adherence   Recent office visits:  04/26/22-Tara Gutierrez,MD(PCP)-pre-op for neurosurgery.Labs ordered(normal blood counts)(A1c 5.9),chest xray,EKG  Recent consult visits:  05/01/22-Tara [name],DO(neuro)-DBS programming.  Hospital visits:  Medication Reconciliation was completed by comparing discharge summary, patient's EMR and Pharmacy list, and upon discussion with patient.  Admitted to the hospital on 05/16/22 due to Lumbar Radiculopathy. Discharge date was 05/17/22. Discharged from [place]?Medications Started at [facility] Discharge:?? -started Hydrocodone/acetaminophen    methocarbamol  Medications Discontinued at Hospital Discharge: -Stopped Tramadol   Medications that remain the same after Hospital Discharge:??  -All other medications will remain the same.    Medications: Outpatient Encounter Medications as of 06/05/2022  Medication Sig   acetaminophen (TYLENOL) 650 MG CR tablet Take 2 tablets (1,300 mg total) by mouth every 8 (eight) hours as needed for pain.   AMBULATORY NON FORMULARY MEDICATION Lift chair Dx:  G20   Calcium Carbonate-Vitamin D (CALCIUM 600/VITAMIN D) 600-400 MG-UNIT chew tablet Chew 1 tablet by mouth daily.   carbidopa-levodopa (SINEMET IR) 25-100 MG tablet 1 po tid prn (Patient taking differently: Take 1 tablet by mouth in the morning and at bedtime.)   Cholecalciferol (VITAMIN D3) 25 MCG (1000 UT) CAPS Take 1 capsule (1,000 Units total) by mouth daily.   clonazePAM (KLONOPIN) 0.5 MG tablet TAKE 0.5 TABLETS (0.25 MG TOTAL) BY MOUTH EVERY MORNING AND 1 TABLET (0.5 MG TOTAL) AT BEDTIME.   clotrimazole-betamethasone (LOTRISONE) cream Apply 1 application topically daily. (Patient not taking: Reported on 05/02/2022)   cyanocobalamin (,VITAMIN B-12,) 1000 MCG/ML injection Inject 1 mL (1,000 mcg  total) into the muscle every 30 (thirty) days.   denosumab (PROLIA) 60 MG/ML SOSY injection Inject 60 mg into the skin every 6 (six) months.   fludrocortisone (FLORINEF) 0.1 MG tablet TAKE 1 TABLET BY MOUTH DAILY   Glycerin-Hypromellose-PEG 400 (ARTIFICIAL TEARS) 0.2-0.2-1 % SOLN Place 1 drop into both eyes daily as needed (dry eyes).   HYDROcodone-acetaminophen (NORCO/VICODIN) 5-325 MG tablet Take 1 tablet by mouth every 4 (four) hours as needed for moderate pain ((score 4 to 6)).   latanoprost (XALATAN) 0.005 % ophthalmic solution Place 1 drop into both eyes at bedtime.   lubiprostone (AMITIZA) 24 MCG capsule Take 1 capsule (24 mcg total) by mouth 2 (two) times daily with a meal. (Patient not taking: Reported on 05/01/2022)   methocarbamol (ROBAXIN) 500 MG tablet Take 1 tablet (500 mg total) by mouth every 6 (six) hours as needed for muscle spasms.   sertraline (ZOLOFT) 100 MG tablet Take 1 tablet (100 mg total) by mouth daily.   SYRINGE-NEEDLE, DISP, 3 ML (BD SAFETYGLIDE SYRINGE/NEEDLE) 25G X 1" 3 ML MISC Use to inject vitamin B12 monthly.   No facility-administered encounter medications on file as of 06/05/2022.    Contacted [name] on 06/19/22 for general disease state and medication adherence call.   Patient is not more than 5 days past due for refill on the following medications per chart history:  Star Medications: Medication Name/mg Last Fill Days Supply No star medications identified   What concerns do you have about your medications? Patient reported not using hydrocodone and methocarbamol, she is using tramadol for post op pain.The patient is managing well after back surgery.  The patient denies side effects with their medications.   How often do you forget or accidentally miss a  dose? Never  Do you use a pillbox? Yes  Are you having any problems getting your medications from your pharmacy? No  Has the cost of your medications been a concern? Yes    Amitza for  constipation was too exipensive   Since last visit with CPP, the following interventions have been made. Patient is taking klonipin at night  Lumbar spine surgery June 2023, patient reports she is walking and doing well.  The patient has not had an ED visit since last contact.   The patient denies problems with their health. The patient feels good after surgery, she is walking and healing well  and is visiting her sister for 1 week  Patient denies concerns or questions for [name], PharmD at this time. Message sent to clinic MA on patients behalf for refill on tramadol klonopin from Dr.G. to be sent to [facility].  Counseled patient on:  [place] job taking medications, Importance of taking medication daily without missed doses, Benefits of adherence packaging or a pillbox, and Access to CCM team for any cost, medication or pharmacy concerns. Visiting   Care Gaps: Annual wellness visit in last year? Yes Most Recent BP reading:131/81  77-P   05/01/22  Upcoming appointments: No appointments scheduled within the next 30 days.  [name], CPP notified  [name], [place]  [phone]

## 2022-06-20 ENCOUNTER — Other Ambulatory Visit: Payer: Self-pay

## 2022-06-20 NOTE — Telephone Encounter (Signed)
Pt stopped hydrocodone and Robaxin (see attached message below). Tramadol not on current med list.  Name of Medication: Clonazepam Name of Pharmacy: [facility] Last Venida Jarvis or Written Date and Quantity: 05/17/22, #45 Last Office Visit and Type: 04/26/22, pre-op exam Next Office Visit and Type: none Last Controlled Substance Agreement Date: 04/28/16 Last UDS: 04/28/16

## 2022-06-20 NOTE — Telephone Encounter (Signed)
-----   Message from [place], [place] sent at 06/19/2022  3:52 PM EDT ----- Regarding: patient asking for refill  While doing assessment call to the patient, she asked me to get a message to Dr.G. She was contacted about being on hydrocodone,methocarbamol, and klonopin. The patient reports she will not use hydrocodone and methocarbamol again. She cancelled current rx's at pharmacy. She is asking for a refill on tramadol and the klonopin. She is post op back surgery, I told the patient I would send a message on her behalf, thanks for any help with this,   [name], [place]  [phone]

## 2022-06-21 MED ORDER — CLONAZEPAM 0.5 MG PO TABS: ORAL_TABLET | ORAL | 0 refills | Status: DC

## 2022-06-21 NOTE — Telephone Encounter (Signed)
ERx 

## 2022-06-26 ENCOUNTER — Other Ambulatory Visit: Payer: Self-pay | Admitting: Family Medicine

## 2022-06-26 NOTE — Telephone Encounter (Signed)
Refill request Tramadol No longer on medication list Discontinued 05/17/22 by Pieter Partridge Dawley DO

## 2022-06-27 ENCOUNTER — Encounter: Payer: Self-pay | Admitting: Family Medicine

## 2022-06-28 NOTE — Telephone Encounter (Signed)
ERx 

## 2022-07-11 DIAGNOSIS — H401131 Primary open-angle glaucoma, bilateral, mild stage: Secondary | ICD-10-CM | POA: Diagnosis not present

## 2022-07-19 ENCOUNTER — Other Ambulatory Visit: Payer: Self-pay | Admitting: Family Medicine

## 2022-07-19 NOTE — Telephone Encounter (Signed)
Refill request Florinef Last refill 05/24/22 #30/1 Last office visit 04/26/22

## 2022-07-21 NOTE — Telephone Encounter (Signed)
ERx 

## 2022-07-25 ENCOUNTER — Other Ambulatory Visit: Payer: Self-pay | Admitting: Family Medicine

## 2022-07-26 NOTE — Telephone Encounter (Signed)
Refill request Tramadol Last refill 06/28/22 #30 Last office visit 04/26/22 No upcoming appointment scheduled

## 2022-07-26 NOTE — Telephone Encounter (Signed)
ERx 

## 2022-07-28 NOTE — Progress Notes (Deleted)
Assessment/Plan:    1.  Parkinsons Disease             -Patient completed neurocognitive testing on January 25, 2021 with Dr. Nicole Kindred.  Evidence of MCI only.  -Patient is status post bilateral STN DBS on July 07, 2021 with IPG placement on July 14, 2021.    -Patient just takes levodopa on an as-needed basis, but estimates that she takes about 1 levodopa per day.  She can continue that.  She has not had any dyskinesia surgery. 2.  Dyskinesia             -Resolved 3.  Neurogenic Orthostatic Hypotension             -Did well on Northera.  Patient stopped that on her own.  Now on Florinef 0.1 mg daily 4.  Depression             -On sertraline             -Declines counseling and thinks she is doing well. 5.  b12 deficiency             -on injections. 6.  Urinary incontinence             -on myrbetriq.  She is doing well in that regard 7.  Lumbar radiculopathy             -She is status post lumbar foraminotomy with Dr. Vertell Limber on November 27, 2019.  Pain has reemerged and she underwent L4-L5 laminectomy with Dr. Reatha Armour on May 16, 2022. 8.  Depression  -On sertraline, 100 mg daily. 9.  Dysphagia  -MBE done in June, 2023 with mild oropharyngeal dysphagia.  Regular diet with thin liquids recommended.   Subjective:   [name] was seen today in follow up for levodopa challenge.  My previous records were reviewed prior to todays visit as well as outside records available to me.  Patient has had L4-L5 laminectomy on June 20 with Dr. Reatha Armour.  She did well with her surgery.  She has had no falls.  She did have a swallow study done since our last visit.  This was done on June 14.  This demonstrated mild oropharyngeal dysphagia.  Regular diet with thin liquid recommended.  Current prescribed movement disorder medications: Carbidopa/levodopa 25/100, 1 tablet twice per day   PREVIOUS MEDICATIONS: northera (worked well but pt d/c when they quit sending b/c she owed $); amantadine  (discontinued because of hallucinations); pramipexole (decreased in past because of hallucinations/dyskinesia); clonazepam (stopped it because of fatigue, but also because she just did not need it any longer)  ALLERGIES:   Allergies  Allergen Reactions   Iohexol Hives and Shortness Of Breath     Code: HIVES, Desc: PT developed 2 hives, followed by SOB, severe headache post 87cc's Omnipaque 300., Onset Date: [id number]    Amitriptyline Other (See Comments)    Sedated next morning   Ciprofloxacin Nausea And Vomiting   Cymbalta [Duloxetine Hcl] Other (See Comments)    Worsened depression   Lyrica [Pregabalin] Other (See Comments)    Tried during hospitalization - unsure effects but unable to tolerate   Imipramine Hcl Rash   Iodine Rash   Lidocaine Hcl Rash   Morphine Sulfate Rash   Neosporin [Neomycin-Bacitracin Zn-Polymyx] Rash and Other (See Comments)    Worsened skin breaking out   Sulfamethoxazole Rash   Tetracyclines & Related Rash    CURRENT MEDICATIONS:  Outpatient Encounter Medications as of 08/02/2022  Medication Sig   acetaminophen (TYLENOL) 650 MG CR tablet Take 2 tablets (1,300 mg total) by mouth every 8 (eight) hours as needed for pain.   AMBULATORY NON FORMULARY MEDICATION Lift chair Dx:  G20   Calcium Carbonate-Vitamin D (CALCIUM 600/VITAMIN D) 600-400 MG-UNIT chew tablet Chew 1 tablet by mouth daily.   carbidopa-levodopa (SINEMET IR) 25-100 MG tablet 1 po tid prn (Patient taking differently: Take 1 tablet by mouth in the morning and at bedtime.)   Cholecalciferol (VITAMIN D3) 25 MCG (1000 UT) CAPS Take 1 capsule (1,000 Units total) by mouth daily.   clonazePAM (KLONOPIN) 0.5 MG tablet Take 0.5 tablets (0.25 mg total) by mouth every morning AND 1 tablet (0.5 mg total) at bedtime.   clotrimazole-betamethasone (LOTRISONE) cream Apply 1 application topically daily. (Patient not taking: Reported on 05/02/2022)   cyanocobalamin (,VITAMIN B-12,) 1000 MCG/ML injection Inject 1 mL  (1,000 mcg total) into the muscle every 30 (thirty) days.   denosumab (PROLIA) 60 MG/ML SOSY injection Inject 60 mg into the skin every 6 (six) months.   fludrocortisone (FLORINEF) 0.1 MG tablet TAKE 1 TABLET BY MOUTH EVERY DAY   Glycerin-Hypromellose-PEG 400 (ARTIFICIAL TEARS) 0.2-0.2-1 % SOLN Place 1 drop into both eyes daily as needed (dry eyes).   HYDROcodone-acetaminophen (NORCO/VICODIN) 5-325 MG tablet Take 1 tablet by mouth every 4 (four) hours as needed for moderate pain ((score 4 to 6)).   latanoprost (XALATAN) 0.005 % ophthalmic solution Place 1 drop into both eyes at bedtime.   lubiprostone (AMITIZA) 24 MCG capsule Take 1 capsule (24 mcg total) by mouth 2 (two) times daily with a meal. (Patient not taking: Reported on 05/01/2022)   methocarbamol (ROBAXIN) 500 MG tablet Take 1 tablet (500 mg total) by mouth every 6 (six) hours as needed for muscle spasms.   sertraline (ZOLOFT) 100 MG tablet Take 1 tablet (100 mg total) by mouth daily.   SYRINGE-NEEDLE, DISP, 3 ML (BD SAFETYGLIDE SYRINGE/NEEDLE) 25G X 1" 3 ML MISC Use to inject vitamin B12 monthly.   traMADol (ULTRAM) 50 MG tablet TAKE 1 TABLET (50 MG TOTAL) BY MOUTH EVERY 6 (SIX) HOURS AS NEEDED FOR MODERATE PAIN   No facility-administered encounter medications on file as of 08/02/2022.    Objective:   PHYSICAL EXAMINATION:    VITALS:   There were no vitals filed for this visit.       GEN:  The patient appears stated age and is in NAD. HEENT:  Normocephalic, atraumatic.  The mucous membranes are moist. The superficial temporal arteries are without ropiness or tenderness. CV:  RRR Lungs:  CTAB Neck/HEME:  There are no carotid bruits bilaterally.  Neurological examination:  Orientation: The patient is alert and oriented x3. Cranial nerves: There is chronic L facial droop.  Smile is symmetric. The speech is fluent and hypophonic and occasionally dysarthric. Soft palate rises symmetrically and there is no tongue deviation.  Hearing is intact to conversational tone. Sensation: Sensation is intact to light touch throughout Motor: Strength is 5/5 in the bilateral upper and lower extremities.   Shoulder shrug is equal and symmetric.  There is no pronator drift.  Movement examination: Tone: There is normal tone in the upper and lower extremities before and after programming Abnormal movements: Rare left upper extremity rest tremor. Coordination:  There is normal rapid alternating movements when she left today. Gait and Station: She drags the right leg with ambulation.  She is somewhat short stepped, but does not shuffle.    Chemistry  Component Value Date/Time   NA 139 04/26/2022 1159   K 4.2 04/26/2022 1159   CL 104 04/26/2022 1159   CO2 27 04/26/2022 1159   BUN 15 04/26/2022 1159   CREATININE 0.72 04/26/2022 1159   CREATININE 0.87 10/08/2019 0921      Component Value Date/Time   CALCIUM 9.5 04/26/2022 1159   ALKPHOS 64 11/02/2021 1058   AST 14 11/02/2021 1058   ALT 5 11/02/2021 1058   BILITOT 0.3 11/02/2021 1058       Lab Results  Component Value Date   WBC 5.8 04/26/2022   HGB 12.1 04/26/2022   HCT 37.5 04/26/2022   MCV 86.9 04/26/2022   PLT 273.0 04/26/2022    Lab Results  Component Value Date   TSH 0.49 10/04/2018    Total time spent on today's visit was *** minutes, including both face-to-face time and nonface-to-face time.  Time included that spent on review of records (prior notes available to me/labs/imaging if pertinent), discussing treatment and goals, answering patient's questions and coordinating care.  This did not include DBS time.  Much of the visit was spent in DBS care, which is documented separate, and coded separate.   Cc:  [name], MD

## 2022-08-02 ENCOUNTER — Encounter: Payer: Medicare Other | Admitting: Neurology

## 2022-08-08 NOTE — Progress Notes (Signed)
Assessment/Plan:    1.  Parkinsons Disease             -Patient completed neurocognitive testing on January 25, 2021 with Dr. Nicole Kindred.  Evidence of MCI only.  -Patient is status post bilateral STN DBS on July 07, 2021 with IPG placement on July 14, 2021.    -Patient just takes levodopa on an as-needed basis, and is currently only taking it about 1 time per week.  I told her to go back to twice per day and see how she does.  She will let me know if she has too much dyskinesia.  She currently has none.  -Patient has superficial burn over her IPG.  It turns out that she has always charged her IPG directly over the skin, without any clothing in between.  Discussed with her that she needs to have clothing in between this.  We also called the [employer] rep and he talked to her as well.  We are going to have her charge it twice per week instead of once per week to decrease charging time for right now, and hopefully help to decrease how hot it gets.  The biggest thing is to have some type of barrier between her and the charger.  Right now, there is no evidence of infection, but I told her that should the skin open up or start to ooze or weep, she is to let somebody know.  For now, I had her put Vaseline or Aquaphor over the irritated area.  If the charger gets hot through clothing, the [place] is going to send her a Engineering geologist.  -Referral sent to physical therapy. 2.  Dyskinesia             -Resolved 3.  Neurogenic Orthostatic Hypotension             -Did well on Northera.  Patient stopped that on her own.  Now on Florinef 0.1 mg daily 4.  Depression             -On sertraline.  Doing well. 5.  b12 deficiency             -on injections. 6.  Urinary incontinence             -on myrbetriq.  She is doing well in that regard 7.  Lumbar radiculopathy             -She is status post lumbar foraminotomy with Dr. Vertell Limber on November 27, 2019.  Pain has reemerged and she underwent  L4-L5 laminectomy with Dr. Reatha Armour on May 16, 2022. 8.  Depression  -On sertraline, 100 mg daily. 9.  Dysphagia  -MBE done in June, 2023 with mild oropharyngeal dysphagia.  Regular diet with thin liquids recommended.   Subjective:   [name] was seen today in follow up for levodopa challenge.  My previous records were reviewed prior to todays visit as well as outside records available to me.  Patient has had L4-L5 laminectomy on June 20 with Dr. Reatha Armour.  She did well with her surgery.  The pain is better but she has stiffness in the legs.   She has had no falls. She has no freezing.  She has some shuffling.  She is only taking her levodopa 1 levodopa prn (had 1 this week).   She did have a swallow study done since our last visit.  This was done on June 14.  This demonstrated mild oropharyngeal dysphagia.  Regular diet with thin liquid recommended.  Current prescribed movement disorder medications: Carbidopa/levodopa 25/100, 1 tablet twice per day   PREVIOUS MEDICATIONS: northera (worked well but pt d/c when they quit sending b/c she owed $); amantadine (discontinued because of hallucinations); pramipexole (decreased in past because of hallucinations/dyskinesia); clonazepam (stopped it because of fatigue, but also because she just did not need it any longer)  ALLERGIES:   Allergies  Allergen Reactions   Iohexol Hives and Shortness Of Breath     Code: HIVES, Desc: PT developed 2 hives, followed by SOB, severe headache post 87cc's Omnipaque 300., Onset Date: [id number]    Amitriptyline Other (See Comments)    Sedated next morning   Ciprofloxacin Nausea And Vomiting   Cymbalta [Duloxetine Hcl] Other (See Comments)    Worsened depression   Lyrica [Pregabalin] Other (See Comments)    Tried during hospitalization - unsure effects but unable to tolerate   Imipramine Hcl Rash   Iodine Rash   Lidocaine Hcl Rash   Morphine Sulfate Rash   Neosporin [Neomycin-Bacitracin Zn-Polymyx] Rash and  Other (See Comments)    Worsened skin breaking out   Sulfamethoxazole Rash   Tetracyclines & Related Rash    CURRENT MEDICATIONS:  Outpatient Encounter Medications as of 08/11/2022  Medication Sig   acetaminophen (TYLENOL) 650 MG CR tablet Take 2 tablets (1,300 mg total) by mouth every 8 (eight) hours as needed for pain.   AMBULATORY NON FORMULARY MEDICATION Lift chair Dx:  G20   Calcium Carbonate-Vitamin D (CALCIUM 600/VITAMIN D) 600-400 MG-UNIT chew tablet Chew 1 tablet by mouth daily.   carbidopa-levodopa (SINEMET IR) 25-100 MG tablet 1 po tid prn (Patient taking differently: Take 1 tablet by mouth in the morning and at bedtime.)   Cholecalciferol (VITAMIN D3) 25 MCG (1000 UT) CAPS Take 1 capsule (1,000 Units total) by mouth daily.   clonazePAM (KLONOPIN) 0.5 MG tablet Take 0.5 tablets (0.25 mg total) by mouth every morning AND 1 tablet (0.5 mg total) at bedtime.   clotrimazole-betamethasone (LOTRISONE) cream Apply 1 application topically daily.   cyanocobalamin (,VITAMIN B-12,) 1000 MCG/ML injection Inject 1 mL (1,000 mcg total) into the muscle every 30 (thirty) days.   denosumab (PROLIA) 60 MG/ML SOSY injection Inject 60 mg into the skin every 6 (six) months.   fludrocortisone (FLORINEF) 0.1 MG tablet TAKE 1 TABLET BY MOUTH EVERY DAY   Glycerin-Hypromellose-PEG 400 (ARTIFICIAL TEARS) 0.2-0.2-1 % SOLN Place 1 drop into both eyes daily as needed (dry eyes).   latanoprost (XALATAN) 0.005 % ophthalmic solution Place 1 drop into both eyes at bedtime.   sertraline (ZOLOFT) 100 MG tablet Take 1 tablet (100 mg total) by mouth daily.   SYRINGE-NEEDLE, DISP, 3 ML (BD SAFETYGLIDE SYRINGE/NEEDLE) 25G X 1" 3 ML MISC Use to inject vitamin B12 monthly.   traMADol (ULTRAM) 50 MG tablet TAKE 1 TABLET (50 MG TOTAL) BY MOUTH EVERY 6 (SIX) HOURS AS NEEDED FOR MODERATE PAIN   [DISCONTINUED] HYDROcodone-acetaminophen (NORCO/VICODIN) 5-325 MG tablet Take 1 tablet by mouth every 4 (four) hours as needed for  moderate pain ((score 4 to 6)).   [DISCONTINUED] lubiprostone (AMITIZA) 24 MCG capsule Take 1 capsule (24 mcg total) by mouth 2 (two) times daily with a meal. (Patient not taking: Reported on 05/01/2022)   [DISCONTINUED] methocarbamol (ROBAXIN) 500 MG tablet Take 1 tablet (500 mg total) by mouth every 6 (six) hours as needed for muscle spasms. (Patient not taking: Reported on 08/11/2022)   No facility-administered encounter medications on file as of 08/11/2022.  Objective:   PHYSICAL EXAMINATION:    VITALS:   Vitals:   08/11/22 0934  BP: 122/70  Pulse: 79  SpO2: 98%  Weight: 161 lb 6.4 oz (73.2 kg)  Height: 5' 5.5" (1.664 m)     GEN:  The patient appears stated age and is in NAD. HEENT:  Normocephalic, atraumatic.  The mucous membranes are moist. The superficial temporal arteries are without ropiness or tenderness. CV:  RRR Lungs:  CTAB Neck/HEME:  There are no carotid bruits bilaterally. Skin:  erythema/rash over the charging site.  No evidence of infection.  No crepitance.  Neurological examination:  Orientation: The patient is alert and oriented x3. Cranial nerves: There is chronic L facial droop.  Smile is symmetric. The speech is fluent and hypophonic and occasionally dysarthric. Soft palate rises symmetrically and there is no tongue deviation. Hearing is intact to conversational tone. Sensation: Sensation is intact to light touch throughout Motor: Strength is at least antigravity x4.  Movement examination: Tone: There is normal tone in the upper and lower extremities before and after programming Abnormal movements: Rare left upper extremity rest tremor (same as previous); when somewhat stressed (when we were talking to the [facility] Scientific rep about the rash, she had some tremor on the right.  R hand is dystonic, mild Coordination:  There is mild decremation on the R Gait and Station: She is short staffed.  She is not really dragging the legs today.  She is shuffling a  little bit.    Chemistry      Component Value Date/Time   NA 139 04/26/2022 1159   K 4.2 04/26/2022 1159   CL 104 04/26/2022 1159   CO2 27 04/26/2022 1159   BUN 15 04/26/2022 1159   CREATININE 0.72 04/26/2022 1159   CREATININE 0.87 10/08/2019 0921      Component Value Date/Time   CALCIUM 9.5 04/26/2022 1159   ALKPHOS 64 11/02/2021 1058   AST 14 11/02/2021 1058   ALT 5 11/02/2021 1058   BILITOT 0.3 11/02/2021 1058       Lab Results  Component Value Date   WBC 5.8 04/26/2022   HGB 12.1 04/26/2022   HCT 37.5 04/26/2022   MCV 86.9 04/26/2022   PLT 273.0 04/26/2022    Lab Results  Component Value Date   TSH 0.49 10/04/2018    Total time spent on today's visit was 60  minutes, including both face-to-face time and nonface-to-face time.  Time included that spent on review of records (prior notes available to me/labs/imaging if pertinent), discussing treatment and goals, answering patient's questions and coordinating care.  This did not include DBS time.     Cc:  [name], MD

## 2022-08-11 ENCOUNTER — Ambulatory Visit (INDEPENDENT_AMBULATORY_CARE_PROVIDER_SITE_OTHER): Payer: Medicare Other | Admitting: Neurology

## 2022-08-11 ENCOUNTER — Encounter: Payer: Self-pay | Admitting: Neurology

## 2022-08-11 VITALS — BP 122/70 | HR 79 | Ht 65.5 in | Wt 161.4 lb

## 2022-08-11 DIAGNOSIS — Z9689 Presence of other specified functional implants: Secondary | ICD-10-CM | POA: Diagnosis not present

## 2022-08-11 DIAGNOSIS — G20 Parkinson's disease: Secondary | ICD-10-CM

## 2022-08-11 NOTE — Patient Instructions (Signed)
Start charging 2 times per week Put tshirt on when charging Use vaseline or aquaphor over irritated site Let Dr. Reatha Armour or myself know if that site opens at all Call Gar Gibbon if you need a new Microbiologist Resources for Power over Parkinson's Group September 2023  [place] over Parkinson's Group:   Power Over Parkinson's Patient Education Group will be Wednesday, September 13th-*Hybrid meting*- in person at [place] location and via [facility] at 2 pm.   Upcoming Power over [employer] Meetings:  2nd Wednesdays of the month at 2 pm:  September 13th, October 11th, November 8th [place] at [email or web address] if interested in participating in this group Parkinson's Care Partners Group:    3rd Mondays, Contact Misty Paladino Atypical Parkinsonian Patient Group:   4th Wednesdays, [place] If you are interested in participating in these groups with Misty, please contact her directly for how to join those meetings.  Her contact information is [email or web address].    LOCAL EVENTS AND NEW OFFERINGS New PWR! Moves [employer]negy Instructor-Led Classes offering at [employer]!  Wednesdays 1-2 pm.   Contact Vonna Kotyk at  [name] or Caron Presume at [place], [email or web address] Dance for Parkinson 's classes will be on Tuesdays 9:30am-10:30am starting October 3-December 12 with a break the week of November 21 . Located in the December 04 which is in the first floor of the [employer]  ([place] for Parkinson's will be held on 2nd and 4th Mondays at 11:00am . First class will start  September 25th.  Located at the [place] ([place].) Through support from the [place] and Drumming for Parkinson's classes are free for both patients and  caregivers.  Contact Misty Taylor-Paladino for more details about registering.  [place]:  [email or web address] PD Health at Home continues:  Mindfulness Mondays, Wellness Wednesdays, Fitness Fridays  Upcoming Education:   Navigating Nutrition with PD.  Wednesday, Sept. 6th 1:00-2:00 pm Understanding Mind and Memory.  Wednesday, Sept. 20th 1:00-2:00 pm  Expert Briefing:    Parkinson's Disease and the Bladder.  Wednesday, Sept. 13th 1:00-2:00 pm Parkinson's and the Gut-Brain Connection.  Wednesday, Oct. 11th 1:00-2:00 pm Register for expert briefings (webinars) at 05-13-1974 Please check out their website to sign up for emails and see their full online offerings   [place]:  [email or web address]  Third Thursday Webinars:  On the third Thursday of every month at 12 p.m. ET, join our free live webinars to learn about various aspects of living with Parkinson's disease and our work to speed medical breakthroughs. Upcoming Webinar:  Stay tuned Check out additional information on their website to see their full online offerings  Thursday:  [email or web address] Upcoming Webinar:   Stay tuned Webinar Series:  Living with Parkinson's Meetup.   Third Thursdays each month, 3 pm Care Partner Monthly Meetup.  With 01-11-1984 [place].  First Tuesday of each month, 2 pm Check out additional information to Live Well Today on their website  Parkinson and Movement Disorders (PMD) Alliance:  www.[email or web address] [employer] Online:  Online Education Events Sign up for emails, which are sent weekly to give you updates on programming and online offerings  Parkinson's Association of the [place]:  www.[email or web address] Information on online support groups, education events, and online exercises including Yoga, Parkinson's exercises and more-[place]OT[place]  of information on links to PD  resources and online events Virtual Support Group through [employer] of the [place]; next one is scheduled for Wednesday, October 4th at 2 pm. (No September meeting due to the symposium.  These are typically scheduled for the 1st Wednesday of the month at 2 pm).  Visit website for details. Register for "Caring for Parkinson's-Caring for You", 9th Annual Symposium.  In-person event in [place].  September 9th.  To register:  [email or web address]_[email or web address] MOVEMENT AND EXERCISE OPPORTUNITIES PWR! Moves Classes at [place].  Wednesdays 10 and 11 am.   Contact Mady Haagensen, PT [email or web address] if interested. NEW PWR! Moves Class offerings at [employer].  Wednesdays 1-2 pm.  Contact Vonna Kotyk at  [email or web address] or Caron Presume at [place],  [email or web address] Parkinson's Wellness Recovery (PWR! Moves)  [email or web address] Info on the PWR! Virtual Experience:  You will have access to our expertise through self-assessment, guided plans that start with the PD-specific fundamentals, educational content, tips, Q&A with an expert, and a growing [employer] of PD-specific pre-recorded and live exercise classes of varying types and intensity - both physical and cognitive! If that is not enough, we offer 1:1 wellness consultations (in-person or virtual) to personalize your PWR! Art therapist.  [place] Fridays:  As part of the PD Health @ Home program, this free video series focuses each week on one aspect of fitness designed to support people living with Parkinson's.  These weekly videos highlight the [place] recent fitness guidelines for people with Parkinson's disease. [address] Dance for PD website is offering free, live-stream classes throughout the week, as well as links to ModemGamers.si of classes:  [email or web address] Virtual dance and Pilates for Parkinson's classes: Click on the Community Tab> Parkinson's Movement Initiative Tab.  To register for classes and for more information, visit [email or web address] and click the "community" tab.  [employer] Parkinson's Cycling Classes  [employer]:  Thursdays @ Noon-Live classes at 01-11-1984 ([employer] at [email or web address] or [phone]) [employer]: Virtual Classes Mondays and Thursdays 01-11-1984 classes Tuesday, Wednesday and Thursday (contact [place] at [email or web address]  or [phone]) [place] Varied levels of classes are offered Tuesdays and Thursdays at [facility].  Stretching with [facility] weekly class is also offered for people with Parkinson's To observe a class or for more information, call [phone] or email [phone] at [email or web address] ADDITIONAL SUPPORT AND RESOURCES Well[employer]Spring [employer]:Online Caregiver Education Opportunities:  www.well-[email or web address]/caregiver-education/[email or web address]-support-group.  You may also contact Hezzie Bump at [email or web address] or [phone].    Coping with Difficult Caregiver Emotions.  Wednesday, September 20th, 10:30 am-12.  The [facility], [facility] Navigating the M[place] of Senior Care Options.  Thursday, September 28th, 4-5:15 pm.  The [facility]. Well-Spring Navigator:  05-27-1995 program, a free service to help individuals and families through the journey of determining care for older adults.  The "Navigator" is a [facility], [employer], who will speak with a prospective client and/or loved ones to provide an assessment of the situation and a set of recommendations for a personalized care plan -- all free of charge, and whether [facility]Spring [employer] offers the needed service or not. If the need is not a service we provide, we are  well-connected with reputable programs in town that we can refer you to.  www.well-[email or web address] or to speak with the Navigator, call [phone].

## 2022-08-11 NOTE — Procedures (Signed)
DBS Programming was performed.    Manufacturer of DBS device: [employer], bluetooth  Total time spent programming was 10 minutes.  Device was confirmed to be on.  Soft start was confirmed to be on.  Impedences were checked and were within normal limits.  Battery was checked and was determined to be functioning normally and not near the end of life.  Final settings were as follows:    Active Contact Amplitude (mA) PW (ms) Frequency (hz) Side Effects  Left Brain       08/08/21 3-C+ 1.7 60 130   Other trials        1-C+ 2.7 60 130 tremor   (2/3/4)-C+ 3.0 60 130 Face pull above 2.1   (5/6/7)-C+ 1.8 60 130 ? Face pull (some baseline facial asymmetry)  08/22/21 5-(25%)7-(75%)C+ 2.5 60 159   09/05/21 final 5-(25%)6-(75%)C+ 2.9 60 159   Other trials 5-(50%)6-(50%)C+ 2.9 60 159    5-(25%)7-(75%)C+ 3.5 60 159 Tremor not controlled  10/31/21 5-(25%)7-(75%)C+ 3.3 60 159   05/01/22 5-(25%)7-(75%)C+ 3.6 60 159 Transient lip tingle  08/11/22 5-(25%)7-(75%)C+ 3.7 60 159                 Right Brain       08/08/21 (5/6/7)-C+ 1.6 60 130   Other trials 1-C+ 1.5 60 130 ? Speech change   (2/3/4)-C+ 1.5 60 130 Pretty good   8-C+ 1.6 60 130 tremor  08/22/21 8-C+ 2.7 60 136   09/05/21 final 6-(50%)8-(50%)C+ 2.9 60 159   others 6-(60%)8-(40%)C+ 2.8  60 136 Mouth dyskinesia  10/31/21 6-(50%)8-(50%)C+ 3.4 60 159   05/01/22 6-(50%)8-(50%)C+ 3.6 60 159   08/11/22 6-(50%)8-(50%)C+ 3.5 60 159

## 2022-08-15 ENCOUNTER — Other Ambulatory Visit: Payer: Self-pay | Admitting: Family Medicine

## 2022-08-16 NOTE — Telephone Encounter (Signed)
Name of Medication: Clonazepam Name of Pharmacy: [facility] [address] Last [name] or Written Date and Quantity: 06/21/22, #45 Last Office Visit and Type: 04/26/22, pre-op eval Next Office Visit and Type: none Last Controlled Substance Agreement Date: 04/28/16 Last UDS: 04/28/16

## 2022-08-16 NOTE — Telephone Encounter (Signed)
ERx 

## 2022-08-17 ENCOUNTER — Encounter: Payer: Self-pay | Admitting: Neurology

## 2022-08-17 DIAGNOSIS — G20 Parkinson's disease: Secondary | ICD-10-CM

## 2022-08-23 ENCOUNTER — Other Ambulatory Visit: Payer: Self-pay | Admitting: Family Medicine

## 2022-08-24 ENCOUNTER — Encounter: Payer: Self-pay | Admitting: Family Medicine

## 2022-08-24 NOTE — Telephone Encounter (Signed)
Name of Medication: Tramadol Name of Pharmacy: [facility] Last Venida Jarvis or Written Date and Quantity: 07/27/23 #30 Last Office Visit and Type: 04/26/22. Pre-op eval Next Office Visit and Type: none Last Controlled Substance Agreement Date: 04/28/16 Last UDS: 04/28/16

## 2022-08-27 NOTE — Telephone Encounter (Signed)
ERx 

## 2022-09-04 ENCOUNTER — Encounter: Payer: Self-pay | Admitting: Physical Therapy

## 2022-09-04 ENCOUNTER — Ambulatory Visit: Payer: Medicare Other | Attending: Neurology | Admitting: Physical Therapy

## 2022-09-04 DIAGNOSIS — R471 Dysarthria and anarthria: Secondary | ICD-10-CM | POA: Diagnosis not present

## 2022-09-04 DIAGNOSIS — G20A1 Parkinson's disease without dyskinesia, without mention of fluctuations: Secondary | ICD-10-CM | POA: Diagnosis not present

## 2022-09-04 DIAGNOSIS — M6281 Muscle weakness (generalized): Secondary | ICD-10-CM

## 2022-09-04 DIAGNOSIS — G20B1 Parkinson's disease with dyskinesia, without mention of fluctuations: Secondary | ICD-10-CM | POA: Diagnosis not present

## 2022-09-04 DIAGNOSIS — R1311 Dysphagia, oral phase: Secondary | ICD-10-CM | POA: Diagnosis not present

## 2022-09-04 DIAGNOSIS — R262 Difficulty in walking, not elsewhere classified: Secondary | ICD-10-CM

## 2022-09-04 NOTE — Therapy (Signed)
OUTPATIENT PHYSICAL THERAPY NEURO EVALUATION   Patient Name: [name] MRN: [id number] DOB:[birth year], 76 y.o., female Today's Date: 09/04/2022   PCP: Leary Roca PROVIDER: [name]   PT End of Session - 09/04/22 1052     Visit Number 1    Date for PT Re-Evaluation 12/05/21    Authorization Type [employer] Medicare    PT Start Time 1050    PT Stop Time 1140    PT Time Calculation (min) 50 min    Activity Tolerance Patient tolerated treatment well    Behavior During Therapy [facility] for tasks assessed/performed             Past Medical History:  Diagnosis Date   Allergy    ANEMIA-NOS 09/25/2007   Anxiety    Arthritis    B12 DEFICIENCY 05/03/2007   Blood transfusion without reported diagnosis    CAD (coronary artery disease) 01/2010   MI, Nishan   Cardiomyopathy ([place]) 02/08/2010   H/o this 2012 after urosepsis, no recurrence.    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   OSTEOPOROSIS 08/2009   bisphosphonate on hold 2/2 dysphagia, on reclast done in August each year   Parkinson's disease 08/25/2015   Dx Dr [name] 07/2015    Pneumonia    PONV (postoperative nausea and vomiting)    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   Takotsubo cardiomyopathy 2008   due to E coli urosepsis   Past Surgical History:  Procedure Laterality Date   ABDOMINAL HYSTERECTOMY  1970s   IUD infection - first partial then with oophorectomy (cysts),  complication - low blood pressure   CATARACT EXTRACTION Bilateral    CHOLECYSTECTOMY     complication - low blood pressure   COLONOSCOPY  06/2008   h/o polyps but latest WNL, rec rpt 10 yrs Olevia Perches)   COLONOSCOPY  11/2018   multiple TAs (10 polyps total), rpt 2 yrs Fuller Plan)   COLONOSCOPY  04/2021   multiple TAs, rpt 3 yrs Fuller Plan)   CYSTOSCOPY  12/2013   abx treatment for recurrent cystitis   DEXA  04/2013   T -2.9 @ femur, -1.6 @ spine   DEXA  04/2017   T -2.9 hip, -0.7 spine   ESOPHAGOGASTRODUODENOSCOPY  12/2017   WNL, regardless esophagus dilated, small HH Fuller Plan)   EYE SURGERY     LUMBAR LAMINECTOMY/DECOMPRESSION MICRODISCECTOMY Right 11/27/2019   Right Lumbar Four-Five foraminotomy;  Erline Levine, MD)   LUMBAR LAMINECTOMY/DECOMPRESSION MICRODISCECTOMY Right 05/16/2022   Procedure: OPEN LUMBAR LAMINECTOMY, RT, L45 W/LATERAL RECESS DECOMPRESSION;  Surgeon: Karsten Ro, DO;  Location: [place];  Service: Neurosurgery;  Laterality: Right;  3C   MINOR PLACEMENT OF FIDUCIAL N/A 06/30/2021   Procedure: MINOR PLACEMENT OF FIDUCIAL;  Surgeon: Erline Levine, MD;  Location: [place];  Service: Neurosurgery;  Laterality: N/A;  Minor room   PTOSIS REPAIR Bilateral 10/2020   Plastic  Surgery   PULSE GENERATOR IMPLANT Right 07/14/2021   Procedure: UNILATERAL PULSE GENERATOR IMPLANT;  Surgeon: Erline Levine, MD;  Location: [place];  Service: Neurosurgery;  Laterality: Right;   SUBTHALAMIC STIMULATOR INSERTION Bilateral 07/07/2021   Procedure: Deep brain stimulator placement;  Surgeon: Erline Levine, MD;  Location: [place];  Service: Neurosurgery;  Laterality: Bilateral;   TUBAL LIGATION     UPPER GASTROINTESTINAL ENDOSCOPY     Patient Active Problem List   Diagnosis Date Noted   Lumbar radiculopathy, chronic 05/16/2022   Pre-op evaluation 04/26/2022   CAP (community acquired pneumonia) 03/29/2022   Localized swelling of chest wall 12/23/2021   Other fatigue 11/25/2021   Sinus pain 11/25/2021    Lymphadenopathy 11/25/2021   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 05/28/2021   Closed fracture of rib of right side with routine healing 05/28/2021   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   Left shoulder pain 06/19/2020   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   Degenerative lumbar spinal stenosis 11/27/2019   Knee pain, bilateral 08/06/2018   Constipation 08/06/2018   Right lumbar radiculopathy 04/10/2018   Dysphagia 10/09/2017   Chronic cough 07/16/2017   GAD (generalized anxiety disorder) 04/28/2016   Chronic insomnia 03/07/2016   Left Achilles tendinitis 12/08/2015   Parkinson's disease 08/25/2015   Advanced care planning/counseling discussion 05/26/2015   Family circumstance 02/23/2015   Encounter for general adult medical examination with abnormal findings 05/21/2014   MDD (major depressive disorder), single episode, moderate ([place]) 11/12/2013   DDD (degenerative disc disease), lumbar 05/31/2013   Medicare annual wellness visit, subsequent 05/20/2013   HLD (hyperlipidemia) 05/20/2013   Vitamin D deficiency 05/09/2013   Recurrent UTI 01/03/2011   Rash and other nonspecific skin eruption 05/11/2010   GERD 02/22/2010   CAD (coronary artery disease) 01/25/2010   Osteoporosis 11/10/2009   Orthostatic hypotension 12/06/2008   Anemia 09/25/2007   Vitamin B12 deficiency 05/03/2007   Fibromyalgia 05/03/2007    ONSET DATE: 06/28/2022  REFERRING DIAG: Parkinson's, S/P lumbar surgery  THERAPY DIAG:  Parkinson's disease with dyskinesia, unspecified whether manifestations fluctuate  Difficulty in walking, not elsewhere classified  Muscle weakness (generalized)  Rationale for Evaluation and Treatment Rehabilitation  SUBJECTIVE:                                                                                                                                                                                               SUBJECTIVE STATEMENT: Patient reports that she feels that over the past few months with the lumbar surgery and her PD she is  not moving well and feels weaker. Pt accompanied by: self  PERTINENT HISTORY: see above, did have lumbar surgery in June, has a deep brain stimulator for movement disorder  PAIN:  Are you having pain? Yes: NPRS scale: 5/10 Pain location: low back Pain description: ache Aggravating factors: bending, lifting, standing pain up to 10/10 Relieving factors: pain meds, rest pain at best a 5/10  PRECAUTIONS: None  WEIGHT BEARING RESTRICTIONS No  FALLS: Has patient fallen in last 6 months? No  LIVING ENVIRONMENT: Lives with: lives with their family Lives in: House/apartment Stairs: Yes: Internal: 12 steps; can reach both Has following equipment at home: Single point cane and Walker - 4 wheeled  PLOF: Independent, does some housework  PATIENT GOALS:  be stronger, walk longer and better  OBJECTIVE:   COGNITION: Overall cognitive status: Within functional limits for tasks assessed   SENSATION: WFL   POSTURE: rounded shoulders, forward head, and decreased lumbar lordosis  LOWER EXTREMITY ROM:    Motions are WFL's but she has poor control of the right LE, weakness and dyskinesia  LOWER EXTREMITY MMT:    MMT Right Eval Left Eval  Hip flexion 3+ 4  Hip extension 4- 4  Hip abduction 4- 4  Hip adduction 4- 4  Hip internal rotation    Hip external rotation    Knee flexion 3+ 4  Knee extension 3+ 4  Ankle dorsiflexion 4- 4-  Ankle plantarflexion    Ankle inversion    Ankle eversion 3+ 4-  (Blank rows = not tested)  TRANSFERS:  Has to use hands, if not falls back into chair  GAIT: Gait pattern: has extraneous motions of the right arm and the right leg and the shoulders and head, off balance at times but typically corrects on own, right LE tends to circumduct and has decreased control of the knee and the ankle Distance  walked: 400 feet Assistive device utilized: None Level of assistance: Complete Independence Comments: on stairs does one at a time  FUNCTIONAL TESTs:  5 times sit to stand: 21 seconds Timed up and go (TUG): 17 seconds BERG balance 39/56  TODAY'S TREATMENT:  09/04/22 Nustep level 4 x 6 minutes   PATIENT EDUCATION: Education details: POC Person educated: Patient and Child(ren) Education method: Explanation Education comprehension: verbalized understanding   HOME EXERCISE PROGRAM: Advised to walk 10 - 20 minutes a day    GOALS: Goals reviewed with patient? Yes  SHORT TERM GOALS: Target date: 09/18/22  Independent with initial HEP  Goal status: INITIAL   LONG TERM GOALS: Target date: 11/27/22  Independent with advanced HEP Goal status: INITIAL  2.  Decrease TUG time to 12 seconds Goal status: INITIAL  3.  Increase BERG balance test score to 46/56 Goal status: INITIAL  4.  Increase right LE strength to 4-/5 Goal status: INITIAL  5.  Get up from sitting without using hands Goal status: INITIAL  ASSESSMENT:  CLINICAL IMPRESSION: Patient is a 76 y.o. female who was seen today for physical therapy evaluation and treatment for Parkinson's and weakness.  She had a lumbar surgery in June, she does report pain in the back and that she had strength issues with the right LE.  She reports some increased difficulty walking, she has had a deep brain stimulator implanted to help with the dyskinesia, she is having some excessive motions of the right UE and LE, she tends to circumduct the right LE and has poor knee and ankle control with walking, she denies falls  but Merrilee Jansky was 39/56   OBJECTIVE IMPAIRMENTS Abnormal gait, decreased activity tolerance, decreased balance, decreased coordination, decreased endurance, decreased mobility, difficulty walking, decreased ROM, decreased strength, increased muscle spasms, impaired flexibility, improper body mechanics, postural dysfunction, and  pain.   REHAB POTENTIAL: Good  CLINICAL DECISION MAKING: Stable/uncomplicated  EVALUATION COMPLEXITY: Low  PLAN: PT FREQUENCY: 1-2x/week  PT DURATION: 12 weeks  PLANNED INTERVENTIONS: Therapeutic exercises, Therapeutic activity, Neuromuscular re-education, Balance training, Gait training, Patient/Family education, Self Care, Joint mobilization, Stair training, Electrical stimulation, Spinal mobilization, Cryotherapy, Moist heat, and Manual therapy  PLAN FOR NEXT SESSION: start motions, strength and balance   Dorsie Burich W, PT 09/04/2022, 10:54 AM

## 2022-09-07 ENCOUNTER — Other Ambulatory Visit: Payer: Self-pay | Admitting: Family Medicine

## 2022-09-07 DIAGNOSIS — F321 Major depressive disorder, single episode, moderate: Secondary | ICD-10-CM

## 2022-09-12 ENCOUNTER — Ambulatory Visit: Payer: Medicare Other | Admitting: Physical Therapy

## 2022-09-12 ENCOUNTER — Other Ambulatory Visit: Payer: Self-pay

## 2022-09-12 ENCOUNTER — Ambulatory Visit: Payer: Medicare Other

## 2022-09-12 DIAGNOSIS — R1311 Dysphagia, oral phase: Secondary | ICD-10-CM

## 2022-09-12 DIAGNOSIS — R471 Dysarthria and anarthria: Secondary | ICD-10-CM | POA: Diagnosis not present

## 2022-09-12 DIAGNOSIS — G20B1 Parkinson's disease with dyskinesia, without mention of fluctuations: Secondary | ICD-10-CM

## 2022-09-12 DIAGNOSIS — R262 Difficulty in walking, not elsewhere classified: Secondary | ICD-10-CM

## 2022-09-12 DIAGNOSIS — M6281 Muscle weakness (generalized): Secondary | ICD-10-CM | POA: Diagnosis not present

## 2022-09-12 DIAGNOSIS — G20A1 Parkinson's disease without dyskinesia, without mention of fluctuations: Secondary | ICD-10-CM | POA: Diagnosis not present

## 2022-09-12 NOTE — Patient Instructions (Signed)
Do 10 loud "Hey"s, and read aloud for 30 seconds in a loud voice four times -twice a day TAKE SMALL SIPS!

## 2022-09-12 NOTE — Therapy (Signed)
OUTPATIENT SPEECH LANGUAGE PATHOLOGY PARKINSON'S EVALUATION   Patient Name: [name] MRN: [id number] DOB:05-15-1946, 76 y.o., female Today's Date: 09/12/2022  PCP: [name], MD REFERRING PROVIDER: Alonza Bogus, DO   End of Session - 09/12/22 1756     Visit Number 1    Number of Visits 15    Date for SLP Re-Evaluation 12/11/22    SLP Start Time 1618    SLP Stop Time  1941    SLP Time Calculation (min) 40 min    Activity Tolerance Patient tolerated treatment well             Past Medical History:  Diagnosis Date   Allergy    ANEMIA-NOS 09/25/2007   Anxiety    Arthritis    B12 DEFICIENCY 05/03/2007   Blood transfusion without reported diagnosis    CAD (coronary artery disease) 01/2010   MI, Nishan   Cardiomyopathy ([place]) 02/08/2010   H/o this 2012 after urosepsis, no recurrence.    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   OSTEOPOROSIS 08/2009   bisphosphonate on hold 2/2 dysphagia, on reclast done in August each year   Parkinson's disease 08/25/2015   Dx Dr [name] 07/2015    Pneumonia    PONV (postoperative nausea and vomiting)    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   Takotsubo cardiomyopathy 2008   due to E coli urosepsis   Past Surgical History:  Procedure Laterality Date   ABDOMINAL HYSTERECTOMY  1970s   IUD infection - first partial then with oophorectomy (cysts), complication - low blood pressure    CATARACT EXTRACTION Bilateral    CHOLECYSTECTOMY     complication - low blood pressure   COLONOSCOPY  06/2008   h/o polyps but latest WNL, rec rpt 10 yrs Olevia Perches)   COLONOSCOPY  11/2018   multiple TAs (10 polyps total), rpt 2 yrs Fuller Plan)   COLONOSCOPY  04/2021   multiple TAs, rpt 3 yrs Fuller Plan)   CYSTOSCOPY  12/2013   abx treatment for recurrent cystitis   DEXA  04/2013   T -2.9 @ femur, -1.6 @ spine   DEXA  04/2017   T -2.9 hip, -0.7 spine   ESOPHAGOGASTRODUODENOSCOPY  12/2017   WNL, regardless esophagus dilated, small HH Fuller Plan)   EYE SURGERY     LUMBAR LAMINECTOMY/DECOMPRESSION MICRODISCECTOMY Right 11/27/2019   Right Lumbar Four-Five foraminotomy;  Erline Levine, MD)   LUMBAR LAMINECTOMY/DECOMPRESSION MICRODISCECTOMY Right 05/16/2022   Procedure: OPEN LUMBAR LAMINECTOMY, RT, L45 W/LATERAL RECESS DECOMPRESSION;  Surgeon: Karsten Ro, DO;  Location: [place];  Service: Neurosurgery;  Laterality: Right;  3C   MINOR PLACEMENT OF FIDUCIAL N/A 06/30/2021   Procedure: MINOR PLACEMENT OF FIDUCIAL;  Surgeon: Erline Levine, MD;  Location: [place];  Service: Neurosurgery;  Laterality: N/A;  Minor room   PTOSIS REPAIR Bilateral 10/2020   Plastic Surgery   PULSE GENERATOR IMPLANT  Right 07/14/2021   Procedure: UNILATERAL PULSE GENERATOR IMPLANT;  Surgeon: Erline Levine, MD;  Location: [place];  Service: Neurosurgery;  Laterality: Right;   SUBTHALAMIC STIMULATOR INSERTION Bilateral 07/07/2021   Procedure: Deep brain stimulator placement;  Surgeon: Erline Levine, MD;  Location: [place];  Service: Neurosurgery;  Laterality: Bilateral;   TUBAL LIGATION     UPPER GASTROINTESTINAL ENDOSCOPY     Patient Active Problem List   Diagnosis Date Noted   Lumbar radiculopathy, chronic 05/16/2022   Pre-op evaluation 04/26/2022   CAP (community acquired pneumonia) 03/29/2022   Localized swelling of chest wall 12/23/2021   Other fatigue 11/25/2021   Sinus pain 11/25/2021   Lymphadenopathy 11/25/2021    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 05/28/2021   Closed fracture of rib of right side with routine healing 05/28/2021   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   Left shoulder pain 06/19/2020   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   Degenerative lumbar spinal stenosis 11/27/2019   Knee pain, bilateral 08/06/2018   Constipation 08/06/2018   Right lumbar radiculopathy 04/10/2018   Dysphagia 10/09/2017   Chronic cough 07/16/2017   GAD (generalized anxiety disorder) 04/28/2016   Chronic insomnia 03/07/2016   Left Achilles tendinitis 12/08/2015   Parkinson's disease 08/25/2015   Advanced care planning/counseling discussion 05/26/2015   Family circumstance 02/23/2015   Encounter for general adult medical examination with abnormal findings 05/21/2014   MDD (major depressive disorder), single episode, moderate ([place]) 11/12/2013   DDD (degenerative disc disease), lumbar 05/31/2013   Medicare annual wellness visit, subsequent 05/20/2013   HLD (hyperlipidemia) 05/20/2013   Vitamin D deficiency 05/09/2013   Recurrent UTI 01/03/2011   Rash and other nonspecific skin eruption 05/11/2010   GERD 02/22/2010   CAD (coronary artery disease) 01/25/2010   Osteoporosis 11/10/2009   Orthostatic hypotension 12/06/2008   Anemia 09/25/2007   Vitamin B12 deficiency 05/03/2007   Fibromyalgia 05/03/2007    ONSET DATE: Script written September 2023  REFERRING DIAG: G20 (ICD-10-CM) - Parkinson's disease   THERAPY DIAG:  Dysarthria and anarthria  Dysphagia, oral phase  Rationale for Evaluation and Treatment Rehabilitation  SUBJECTIVE:   SUBJECTIVE STATEMENT: "Everyone asks me to speak up. It's aggravating." Pt accompanied by: self  PERTINENT HISTORY: PMHx significant for Parkinsons disease w/ DBS 06/2021, Hx of orthostatic hypotension, anxiety/depression, RUE/RLE reflex sympathetic dystrophy, osteoporosis,  lupus, fibromyalgia, CHF and OA. Lumbar sx June 2023. Pt with evaluation for voice/dysarthria in Oct 2022 but did not return after eval due to medical reason.  PAIN:  Are you having pain? Yes: NPRS scale: 4/10 Pain location: back (low) Pain description: sore/ache   FALLS: Has patient fallen in last 6 months?  See PT evaluation for details  LIVING ENVIRONMENT: Lives with: lives with their family (son) Lives in: House/apartment  PLOF:  Level of assistance: Independent with ADLs, Comment: does most housework Employment: Retired  PATIENT GOALS: Talk louder  OBJECTIVE:   DIAGNOSTIC FINDINGS:  Clinical Impression - MBSS   05-10-22             Patient presents with a very mild oropharyngeal dysphagia with primary impairments being delays in anterior to posterior transit of solid texture boluses in oral cavity and mildly delayed pharyngeal peristalsis of puree and mechanical soft solids. She did exhibit mildly delayed swallow initiation of mechanical soft solids at level of vallecular sinus and trace residuals above UES opening. No other pharyngeal residuals observed with any of the  tested consistencies (thin liquids, puree solids, mechanical soft solids, 1/2 barium tablet. No penetration or aspiration observed with liquids or solids. Esophageal sweep did not reveal significant amount of barium stasis and esophageal transit appeared WFL. (this is an improvement as compared to 2018 MBS). Patient would cough after most swallows in absence of any observed penetration or aspiration. She did also have a cough prior to any PO's being given and when asked most recent time she ate, she reported that she has not had anything to eat or drink since last night. SLP reviewed MBS from 2018 which reports "no airway compromise despite frequent coughing immediately post-swallow". Unfortunately, results of this MBS do not explain patient's frequent coughing that occurs immediately after swallow PO's. Swallow  Evaluation Recommendations  SLP Diet Recommendations: Regular solids;Thin liquid  Liquid Administration via: Straw;Cup  Medication Administration: Whole meds with liquid  Compensations: Minimize environmental distractions;Slow rate;Small sips/bites  COGNITION: Overall cognitive status: Within functional limits for tasks assessed  MOTOR SPEECH: Overall motor speech: impaired Level of impairment: Word Respiration: clavicular breathing Phonation: aphonic and low vocal intensity Resonance: WFL Articulation:  short rushes of speech, rarely in conversation Intelligibility: Intelligibility reduced  approx 95% Motor planning: Appears intact Effective technique: increased vocal intensity  ORAL MOTOR EXAMINATION: Overall status: Impaired:   Labial: Bilateral (ROM and Coordination) Lingual: Bilateral (ROM and Coordination) Facial: Bilateral (ROM) Comments: ROM improved to almost 100% accuracy for labial and lingual musculature with visual and/or tactile targets   OBJECTIVE VOICE ASSESSMENT: Sustained "ah" maximum phonation time: STRAINED Sustained "ah" loudness average: STRAINED "HEY" loudness average: began with average low 70s dB but SLP shaped to low 80s dB Oral reading (passage) loudness average: 63 dB Oral reading loudness range: <60dB to 65 dB Conversational loudness average: 62 dB Conversational loudness range: <60 dB to 65dB Voice quality: low vocal intensity and aphonic Stimulability trials: Given SLP modeling and usual mod cues, loudness average increased to upper 60s to los 70s dB (range of 64 to 72) at sentence level.  Comments: Pt is stimulable for improvement in loudness. Intermittently in conversation from this explanation and following pt's loudness improved to upper 60s dB. Pt told SLP she was thinking about talking louder at these times in conversation.   Completed audio recording of patients baseline voice without cueing from SLP: No  Pt does report difficulty with  swallowing which does not warrant further evaluation. MBSS June 2023 indicating pt take smaller sips liquids. She admitted she is NOT doing this at home and has "coughing spells" rarely with liquids.  PATIENT REPORTED OUTCOME MEASURES (PROM): Communication Effectiveness Survey: to be completed in first session   TODAY'S TREATMENT: SLP discussed results of evaluation with pt, and educated pt re: loud "hey", and her work to do at home - see pt instructions  PATIENT EDUCATION: Education details: see above in "today's treatment" Person educated: Patient Education method: Consulting civil engineer, Demonstration, Verbal cues, and Handouts Education comprehension: verbalized understanding, returned demonstration, verbal cues required, and needs further education  HOME EXERCISE PROGRAM: 10 loud "hey", read out loud for 30 seconds x4, BID   GOALS: Goals reviewed with patient? No  SHORT TERM GOALS: Target date: 10/10/2022    pt will produce loud /a/ or "hey" with low 80s dB average in 3 sessions  Baseline: Goal status: INITIAL  2.  pt will demo abdominal breathing at rest 75% of the time in 3 sessions  Baseline:  Goal status: INITIAL  3.  Dajae will answer 15-20 mod complex "wh" questions  with average volume in upper 60s dB in 3 sessions  Baseline:  Goal status: INITIAL  4.  Pt will complete HEP 75% between sessions Baseline:  Goal status: INITIAL   LONG TERM GOALS: Target date: 12/05/2022    pt will demonstrate abdominal breathing in simple Q and A responses 75% of the time in 3 sessions  Baseline:  Goal status: INITIAL  2.  Charlesetta will produce speech volume in 3 minutes simple conversation with average mid-upper 60s dB in 3 sessions  Baseline:  Goal status: INITIAL  3.  Pt will produce speech volume of upper 60s dB in 5 minutes simple conversation with occasional nonverbal cues in 3 sessions  Baseline:  Goal status: INITIAL  4.  Pt will improve PROM measurement when compared to  initial session, when given in last week of speech therapy Baseline:  Goal status: INITIAL    5.  SLP to monitor pt's intake of liquids to ensure pulmonary safety    Baseline:    Goal Status: Initial  ASSESSMENT:  CLINICAL IMPRESSION: Patient is a 76 y.o. female who was seen today for assessment of dysarthria/voice in light of PD. She indicates "everyone" asks her to repeat herself and it upsets her. SLP diagnostic therapy today revealed pt is an excellent candidate to improve her loudness in conversation. SLP to monitor/possibly target aspiration precautions for pt's liquid intake to ensure pulmonary safety.  OBJECTIVE IMPAIRMENTS: Objective impairments include dysarthria and voice disorder. These impairments are limiting patient from household responsibilities, ADLs/IADLs, and effectively communicating at home and in community.Factors affecting potential to achieve goals and functional outcome are  none .Marland Kitchen Patient will benefit from skilled SLP services to address above impairments and improve overall function.  REHAB POTENTIAL: Good  PLAN:  SLP FREQUENCY: 2x/week  SLP DURATION: 12 weeks  PLANNED INTERVENTIONS: Aspiration precaution training, Pharyngeal strengthening exercises, Diet toleration management , Cueing hierachy, Internal/external aids, Oral motor exercises, Functional tasks, SLP instruction and feedback, Compensatory strategies, and Patient/family education    [facility], [place] 09/12/2022, 5:57 PM

## 2022-09-12 NOTE — Therapy (Signed)
OUTPATIENT PHYSICAL THERAPY NEURO   Patient Name: [name] MRN: [id number] DOB:Feb 09, 1946, 76 y.o., female Today's Date: 09/12/2022   PCP: Leary Roca PROVIDER: [name]   PT End of Session - 09/12/22 1527     Visit Number 2    Number of Visits 12    Date for PT Re-Evaluation 12/05/21    Authorization Type UHC Medicare    PT Start Time 2505    PT Stop Time 3976    PT Time Calculation (min) 45 min             Past Medical History:  Diagnosis Date   Allergy    ANEMIA-NOS 09/25/2007   Anxiety    Arthritis    B12 DEFICIENCY 05/03/2007   Blood transfusion without reported diagnosis    CAD (coronary artery disease) 01/2010   MI, Nishan   Cardiomyopathy ([place]) 02/08/2010   H/o this 2012 after urosepsis, no recurrence.    Cataract    CHF (congestive heart failure) (HCC)    with episode of sepsis   Complication of anesthesia    Depression    not recently   FIBROMYALGIA 05/03/2007   Fibromyalgia    GERD 02/22/2010   Glaucoma 02/2013   [facility]   Headache    History of CHF (congestive heart failure) 01/2010   History of colon polyps 2004   HYPERLIPIDEMIA 12/19/2007   HYPOTENSION, ORTHOSTATIC 12/06/2008   Interstitial cystitis    Ottelin now Dr Amalia Hailey   Lupus (systemic lupus erythematosus) ([place]) 02/08/2010   MCTD (mixed connective tissue disease) ([place]) 02/08/2010   OSTEOPOROSIS 08/2009   bisphosphonate on hold 2/2 dysphagia, on reclast done in August each year   Parkinson's disease 08/25/2015   Dx Dr [name] 07/2015    Pneumonia    PONV (postoperative nausea and vomiting)    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   Takotsubo cardiomyopathy 2008   due to E coli urosepsis   Past Surgical History:  Procedure Laterality Date   ABDOMINAL HYSTERECTOMY  1970s   IUD infection - first partial then with oophorectomy (cysts), complication - low blood pressure   CATARACT EXTRACTION Bilateral    CHOLECYSTECTOMY     complication - low  blood pressure   COLONOSCOPY  06/2008   h/o polyps but latest WNL, rec rpt 10 yrs Olevia Perches)   COLONOSCOPY  11/2018   multiple TAs (10 polyps total), rpt 2 yrs Fuller Plan)   COLONOSCOPY  04/2021   multiple TAs, rpt 3 yrs Fuller Plan)   CYSTOSCOPY  12/2013   abx treatment for recurrent cystitis   DEXA  04/2013   T -2.9 @ femur, -1.6 @ spine   DEXA  04/2017   T -2.9 hip, -0.7 spine   ESOPHAGOGASTRODUODENOSCOPY  12/2017   WNL, regardless esophagus dilated, small HH Fuller Plan)   EYE SURGERY     LUMBAR LAMINECTOMY/DECOMPRESSION MICRODISCECTOMY Right 11/27/2019   Right Lumbar Four-Five foraminotomy;  Erline Levine, MD)   LUMBAR LAMINECTOMY/DECOMPRESSION MICRODISCECTOMY Right 05/16/2022   Procedure: OPEN LUMBAR LAMINECTOMY, RT, L45 W/LATERAL RECESS DECOMPRESSION;  Surgeon: Karsten Ro, DO;  Location: [place];  Service: Neurosurgery;  Laterality: Right;  3C   MINOR PLACEMENT OF FIDUCIAL N/A 06/30/2021   Procedure: MINOR PLACEMENT OF FIDUCIAL;  Surgeon: Erline Levine, MD;  Location: [place];  Service: Neurosurgery;  Laterality: N/A;  Minor room   PTOSIS REPAIR Bilateral 10/2020   Plastic Surgery   PULSE GENERATOR IMPLANT Right 07/14/2021   Procedure: UNILATERAL PULSE  GENERATOR IMPLANT;  Surgeon: Erline Levine, MD;  Location: [place];  Service: Neurosurgery;  Laterality: Right;   SUBTHALAMIC STIMULATOR INSERTION Bilateral 07/07/2021   Procedure: Deep brain stimulator placement;  Surgeon: Erline Levine, MD;  Location: [place];  Service: Neurosurgery;  Laterality: Bilateral;   TUBAL LIGATION     UPPER GASTROINTESTINAL ENDOSCOPY     Patient Active Problem List   Diagnosis Date Noted   Lumbar radiculopathy, chronic 05/16/2022   Pre-op evaluation 04/26/2022   CAP (community acquired pneumonia) 03/29/2022   Localized swelling of chest wall 12/23/2021   Other fatigue 11/25/2021   Sinus pain 11/25/2021   Lymphadenopathy 11/25/2021   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 05/28/2021   Closed fracture of rib of  right side with routine healing 05/28/2021   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   Left shoulder pain 06/19/2020   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   Degenerative lumbar spinal stenosis 11/27/2019   Knee pain, bilateral 08/06/2018   Constipation 08/06/2018   Right lumbar radiculopathy 04/10/2018   Dysphagia 10/09/2017   Chronic cough 07/16/2017   GAD (generalized anxiety disorder) 04/28/2016   Chronic insomnia 03/07/2016   Left Achilles tendinitis 12/08/2015   Parkinson's disease 08/25/2015   Advanced care planning/counseling discussion 05/26/2015   Family circumstance 02/23/2015   Encounter for general adult medical examination with abnormal findings 05/21/2014   MDD (major depressive disorder), single episode, moderate ([place]) 11/12/2013   DDD (degenerative disc disease), lumbar 05/31/2013   Medicare annual wellness visit, subsequent 05/20/2013   HLD (hyperlipidemia) 05/20/2013   Vitamin D deficiency 05/09/2013   Recurrent UTI 01/03/2011   Rash and other nonspecific skin eruption 05/11/2010   GERD 02/22/2010   CAD (coronary artery disease) 01/25/2010   Osteoporosis 11/10/2009   Orthostatic hypotension 12/06/2008   Anemia 09/25/2007   Vitamin B12 deficiency 05/03/2007   Fibromyalgia 05/03/2007    ONSET DATE: 06/28/2022  REFERRING DIAG: Parkinson's, S/P lumbar surgery  THERAPY DIAG:  Parkinson's disease with dyskinesia, unspecified whether manifestations fluctuate  Difficulty in walking, not elsewhere classified  Muscle weakness (generalized)  Rationale for Evaluation and Treatment Rehabilitation  SUBJECTIVE:                                                                                                                                                                                              SUBJECTIVE STATEMENT: pain stays about 5-6. Rt foot was acting up but stim  adjusted and seems better. More active today  Pt accompanied by: self  PERTINENT HISTORY: see above, did have lumbar surgery in  June, has a deep brain stimulator for movement disorder  PAIN:  Are you having pain? Yes: NPRS scale: 5/10 Pain location: low back Pain description: ache Aggravating factors: bending, lifting, standing pain up to 10/10 Relieving factors: pain meds, rest pain at best a 5/10  PRECAUTIONS: None  WEIGHT BEARING RESTRICTIONS No  FALLS: Has patient fallen in last 6 months? No  LIVING ENVIRONMENT: Lives with: lives with their family Lives in: House/apartment Stairs: Yes: Internal: 12 steps; can reach both Has following equipment at home: Single point cane and Walker - 4 wheeled  PLOF: Independent, does some housework  PATIENT GOALS:  be stronger, walk longer and better  OBJECTIVE:   COGNITION: Overall cognitive status: Within functional limits for tasks assessed   SENSATION: WFL   POSTURE: rounded shoulders, forward head, and decreased lumbar lordosis  LOWER EXTREMITY ROM:    Motions are WFL's but she has poor control of the right LE, weakness and dyskinesia  LOWER EXTREMITY MMT:    MMT Right Eval Left Eval  Hip flexion 3+ 4  Hip extension 4- 4  Hip abduction 4- 4  Hip adduction 4- 4  Hip internal rotation    Hip external rotation    Knee flexion 3+ 4  Knee extension 3+ 4  Ankle dorsiflexion 4- 4-  Ankle plantarflexion    Ankle inversion    Ankle eversion 3+ 4-  (Blank rows = not tested)  TRANSFERS:  Has to use hands, if not falls back into chair  GAIT: Gait pattern: has extraneous motions of the right arm and the right leg and the shoulders and head, off balance at times but typically corrects on own, right LE tends to circumduct and has decreased control of the knee and the ankle Distance walked: 400 feet Assistive device utilized: None Level of assistance: Complete Independence Comments: on stairs does one at a  time  FUNCTIONAL TESTs:  5 times sit to stand: 21 seconds Timed up and go (TUG): 17 seconds BERG balance 39/56  TODAY'S TREATMENT:   1017/23 Nustep L 5 6 min Feet on ball bridge, KTC and obl 10 x Isometric abs with ball hold 3 sec 15 x 2# SLR 10 BIL then SLR with abd 10 x each STS with wt ball hold 10 x Black tband trunk flex and ext 2 sets 10 Seated row 15# 2 sets 10 Lat pull 15 # 2 sets 10 Knee ext 2 sets 10 5# HS curl 2 sets 10 15#    09/04/22 Nustep level 4 x 6 minutes   PATIENT EDUCATION: Education details: POC Person educated: Patient and Child(ren) Education method: Explanation Education comprehension: verbalized understanding   HOME EXERCISE PROGRAM: Advised to walk 10 - 20 minutes a day    GOALS: Goals reviewed with patient? Yes  SHORT TERM GOALS: Target date: 09/18/22  Independent with initial HEP  Goal status: INITIAL   LONG TERM GOALS: Target date: 11/27/22  Independent with advanced HEP Goal status: INITIAL  2.  Decrease TUG time to 12 seconds Goal status: INITIAL  3.  Increase BERG balance test score to 46/56 Goal status: INITIAL  4.  Increase right LE strength to 4-/5 Goal status: INITIAL  5.  Get up from sitting without using hands Goal status: INITIAL  ASSESSMENT:  CLINICAL IMPRESSION: pt tolerated initial progression of ex well with cuing for form, correct muscle activation and speed of mvmt. Pt verb no increase in pain   OBJECTIVE IMPAIRMENTS Abnormal gait, decreased activity tolerance, decreased balance, decreased  coordination, decreased endurance, decreased mobility, difficulty walking, decreased ROM, decreased strength, increased muscle spasms, impaired flexibility, improper body mechanics, postural dysfunction, and pain.   REHAB POTENTIAL: Good  CLINICAL DECISION MAKING: Stable/uncomplicated  EVALUATION COMPLEXITY: Low  PLAN: PT FREQUENCY: 1-2x/week  PT DURATION: 12 weeks  PLANNED INTERVENTIONS: Therapeutic  exercises, Therapeutic activity, Neuromuscular re-education, Balance training, Gait training, Patient/Family education, Self Care, Joint mobilization, Stair training, Electrical stimulation, Spinal mobilization, Cryotherapy, Moist heat, and Manual therapy  PLAN FOR NEXT SESSION: assess response to today session and progress strength and balance   Olajuwon Fosdick,ANGIE, PTA 09/12/2022, 3:28 PM       [address] [place], [place], [address] Phone: [phone]   Fax:  [phone]  Patient Details  Name: SIMRIT GOHLKE MRN: [id number] Date of Birth: 1946/10/01 Referring Provider:  [name], MD  Encounter Date: 09/12/2022   Laqueta Carina, PTA 09/12/2022, 3:28 PM  [address] [place], [place], [address] Phone: [phone]   Fax:  [phone]

## 2022-09-14 ENCOUNTER — Ambulatory Visit: Payer: Medicare Other | Admitting: Physical Therapy

## 2022-09-14 ENCOUNTER — Encounter: Payer: Self-pay | Admitting: Physical Therapy

## 2022-09-14 ENCOUNTER — Telehealth: Payer: Self-pay

## 2022-09-14 DIAGNOSIS — G20A1 Parkinson's disease without dyskinesia, without mention of fluctuations: Secondary | ICD-10-CM | POA: Diagnosis not present

## 2022-09-14 DIAGNOSIS — G20B1 Parkinson's disease with dyskinesia, without mention of fluctuations: Secondary | ICD-10-CM

## 2022-09-14 DIAGNOSIS — M810 Age-related osteoporosis without current pathological fracture: Secondary | ICD-10-CM

## 2022-09-14 DIAGNOSIS — R1311 Dysphagia, oral phase: Secondary | ICD-10-CM | POA: Diagnosis not present

## 2022-09-14 DIAGNOSIS — R262 Difficulty in walking, not elsewhere classified: Secondary | ICD-10-CM

## 2022-09-14 DIAGNOSIS — M6281 Muscle weakness (generalized): Secondary | ICD-10-CM

## 2022-09-14 DIAGNOSIS — R471 Dysarthria and anarthria: Secondary | ICD-10-CM | POA: Diagnosis not present

## 2022-09-14 NOTE — Therapy (Signed)
OUTPATIENT PHYSICAL THERAPY NEURO   Patient Name: [name] MRN: [id number] DOB:May 27, 1946, 76 y.o., female Today's Date: 09/14/2022   PCP: Leary Roca PROVIDER: [name]   PT End of Session - 09/14/22 1711     Visit Number 3    Date for PT Re-Evaluation 12/05/21    Authorization Type UHC Medicare    PT Start Time 1708    PT Stop Time 1700    PT Time Calculation (min) 47 min    Activity Tolerance Patient tolerated treatment well    Behavior During Therapy [facility] for tasks assessed/performed             Past Medical History:  Diagnosis Date   Allergy    ANEMIA-NOS 09/25/2007   Anxiety    Arthritis    B12 DEFICIENCY 05/03/2007   Blood transfusion without reported diagnosis    CAD (coronary artery disease) 01/2010   MI, Nishan   Cardiomyopathy ([place]) 02/08/2010   H/o this 2012 after urosepsis, no recurrence.    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   OSTEOPOROSIS 08/2009   bisphosphonate on hold 2/2 dysphagia, on reclast done in August each year   Parkinson's disease 08/25/2015   Dx Dr [name] 07/2015    Pneumonia    PONV (postoperative nausea and vomiting)    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   Takotsubo cardiomyopathy 2008   due to E coli urosepsis   Past Surgical History:  Procedure Laterality Date   ABDOMINAL HYSTERECTOMY  1970s   IUD infection - first partial then with oophorectomy (cysts), complication - low  blood pressure   CATARACT EXTRACTION Bilateral    CHOLECYSTECTOMY     complication - low blood pressure   COLONOSCOPY  06/2008   h/o polyps but latest WNL, rec rpt 10 yrs Olevia Perches)   COLONOSCOPY  11/2018   multiple TAs (10 polyps total), rpt 2 yrs Fuller Plan)   COLONOSCOPY  04/2021   multiple TAs, rpt 3 yrs Fuller Plan)   CYSTOSCOPY  12/2013   abx treatment for recurrent cystitis   DEXA  04/2013   T -2.9 @ femur, -1.6 @ spine   DEXA  04/2017   T -2.9 hip, -0.7 spine   ESOPHAGOGASTRODUODENOSCOPY  12/2017   WNL, regardless esophagus dilated, small HH Fuller Plan)   EYE SURGERY     LUMBAR LAMINECTOMY/DECOMPRESSION MICRODISCECTOMY Right 11/27/2019   Right Lumbar Four-Five foraminotomy;  Erline Levine, MD)   LUMBAR LAMINECTOMY/DECOMPRESSION MICRODISCECTOMY Right 05/16/2022   Procedure: OPEN LUMBAR LAMINECTOMY, RT, L45 W/LATERAL RECESS DECOMPRESSION;  Surgeon: Karsten Ro, DO;  Location: [facility];  Service: Neurosurgery;  Laterality: Right;  3C   MINOR PLACEMENT OF FIDUCIAL N/A 06/30/2021   Procedure: MINOR PLACEMENT OF FIDUCIAL;  Surgeon: Erline Levine, MD;  Location: [place];  Service: Neurosurgery;  Laterality: N/A;  Minor room   PTOSIS REPAIR Bilateral 10/2020   Plastic Surgery  PULSE GENERATOR IMPLANT Right 07/14/2021   Procedure: UNILATERAL PULSE GENERATOR IMPLANT;  Surgeon: Erline Levine, MD;  Location: [place];  Service: Neurosurgery;  Laterality: Right;   SUBTHALAMIC STIMULATOR INSERTION Bilateral 07/07/2021   Procedure: Deep brain stimulator placement;  Surgeon: Erline Levine, MD;  Location: [facility];  Service: Neurosurgery;  Laterality: Bilateral;   TUBAL LIGATION     UPPER GASTROINTESTINAL ENDOSCOPY     Patient Active Problem List   Diagnosis Date Noted   Lumbar radiculopathy, chronic 05/16/2022   Pre-op evaluation 04/26/2022   CAP (community acquired pneumonia) 03/29/2022   Localized swelling of chest wall 12/23/2021   Other fatigue 11/25/2021   Sinus pain 11/25/2021   Lymphadenopathy  11/25/2021   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 05/28/2021   Closed fracture of rib of right side with routine healing 05/28/2021   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   Left shoulder pain 06/19/2020   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   Degenerative lumbar spinal stenosis 11/27/2019   Knee pain, bilateral 08/06/2018   Constipation 08/06/2018   Right lumbar radiculopathy 04/10/2018   Dysphagia 10/09/2017   Chronic cough 07/16/2017   GAD (generalized anxiety disorder) 04/28/2016   Chronic insomnia 03/07/2016   Left Achilles tendinitis 12/08/2015   Parkinson's disease 08/25/2015   Advanced care planning/counseling discussion 05/26/2015   Family circumstance 02/23/2015   Encounter for general adult medical examination with abnormal findings 05/21/2014   MDD (major depressive disorder), single episode, moderate ([place]) 11/12/2013   DDD (degenerative disc disease), lumbar 05/31/2013   Medicare annual wellness visit, subsequent 05/20/2013   HLD (hyperlipidemia) 05/20/2013   Vitamin D deficiency 05/09/2013   Recurrent UTI 01/03/2011   Rash and other nonspecific skin eruption 05/11/2010   GERD 02/22/2010   CAD (coronary artery disease) 01/25/2010   Osteoporosis 11/10/2009   Orthostatic hypotension 12/06/2008   Anemia 09/25/2007   Vitamin B12 deficiency 05/03/2007   Fibromyalgia 05/03/2007    ONSET DATE: 06/28/2022  REFERRING DIAG: Parkinson's, S/P lumbar surgery  THERAPY DIAG:  Parkinson's disease with dyskinesia, unspecified whether manifestations fluctuate  Difficulty in walking, not elsewhere classified  Muscle weakness (generalized)  Rationale for Evaluation and Treatment Rehabilitation  SUBJECTIVE:                                                                                                                                                                                               SUBJECTIVE STATEMENT:  I was tired and my back felt a little better Pt accompanied by: self  PERTINENT HISTORY: see above, did have lumbar  surgery in June, has a deep brain stimulator for movement disorder  PAIN:  Are you having pain? Yes: NPRS scale: 5/10 Pain location: low back Pain description: ache Aggravating factors: bending, lifting, standing pain up to 10/10 Relieving factors: pain meds, rest pain at best a 5/10  PRECAUTIONS: None  WEIGHT BEARING RESTRICTIONS No  FALLS: Has patient fallen in last 6 months? No  LIVING ENVIRONMENT: Lives with: lives with their family Lives in: House/apartment Stairs: Yes: Internal: 12 steps; can reach both Has following equipment at home: Single point cane and Walker - 4 wheeled  PLOF: Independent, does some housework  PATIENT GOALS:  be stronger, walk longer and better  OBJECTIVE:   COGNITION: Overall cognitive status: Within functional limits for tasks assessed   SENSATION: WFL   POSTURE: rounded shoulders, forward head, and decreased lumbar lordosis  LOWER EXTREMITY ROM:    Motions are WFL's but she has poor control of the right LE, weakness and dyskinesia  LOWER EXTREMITY MMT:    MMT Right Eval Left Eval  Hip flexion 3+ 4  Hip extension 4- 4  Hip abduction 4- 4  Hip adduction 4- 4  Hip internal rotation    Hip external rotation    Knee flexion 3+ 4  Knee extension 3+ 4  Ankle dorsiflexion 4- 4-  Ankle plantarflexion    Ankle inversion    Ankle eversion 3+ 4-  (Blank rows = not tested)  TRANSFERS:  Has to use hands, if not falls back into chair  GAIT: Gait pattern: has extraneous motions of the right arm and the right leg and the shoulders and head, off balance at times but typically corrects on own, right LE tends to circumduct and has decreased control of the knee and the ankle Distance walked: 400 feet Assistive device utilized: None Level of assistance: Complete Independence Comments: on  stairs does one at a time  FUNCTIONAL TESTs:  5 times sit to stand: 21 seconds Timed up and go (TUG): 17 seconds BERG balance 39/56  TODAY'S TREATMENT:  09/14/22 Bike level 3 x 6 minutes Gait around the building 1 lap Feet on ball K2C, trunk rotation, small bridge, isometric abs Leg extension 5# 2x10 Leg curls 15# 2x10 Volley ball Walking ball toss Ball kicks Nustep level 5 x 5 minutes  1017/23 Nustep L 5 6 min Feet on ball bridge, KTC and obl 10 x Isometric abs with ball hold 3 sec 15 x 2# SLR 10 BIL then SLR with abd 10 x each STS with wt ball hold 10 x Black tband trunk flex and ext 2 sets 10 Seated row 15# 2 sets 10 Lat pull 15 # 2 sets 10 Knee ext 2 sets 10 5# HS curl 2 sets 10 15#    09/04/22 Nustep level 4 x 6 minutes   PATIENT EDUCATION: Education details: POC Person educated: Patient and Child(ren) Education method: Explanation Education comprehension: verbalized understanding   HOME EXERCISE PROGRAM: Advised to walk 10 - 20 minutes a day    GOALS: Goals reviewed with patient? Yes  SHORT TERM GOALS: Target date: 09/18/22  Independent with initial HEP  Goal status:met   LONG TERM GOALS: Target date: 11/27/22  Independent with advanced HEP Goal status: INITIAL  2.  Decrease TUG time to 12 seconds Goal status: INITIAL  3.  Increase BERG balance test score to 46/56 Goal status: INITIAL  4.  Increase right LE strength to 4-/5 Goal status: INITIAL  5.  Get up from sitting without using  hands Goal status: INITIAL  ASSESSMENT:  CLINICAL IMPRESSION:  Continued to add activities that challenge her balance function and strength, she did very well with the balance, had one instance where her toe did not clear trying to move and react fast, small LOB was able to correct on own  OBJECTIVE IMPAIRMENTS Abnormal gait, decreased activity tolerance, decreased balance, decreased coordination, decreased endurance, decreased mobility, difficulty  walking, decreased ROM, decreased strength, increased muscle spasms, impaired flexibility, improper body mechanics, postural dysfunction, and pain.   REHAB POTENTIAL: Good  CLINICAL DECISION MAKING: Stable/uncomplicated  EVALUATION COMPLEXITY: Low  PLAN: PT FREQUENCY: 1-2x/week  PT DURATION: 12 weeks  PLANNED INTERVENTIONS: Therapeutic exercises, Therapeutic activity, Neuromuscular re-education, Balance training, Gait training, Patient/Family education, Self Care, Joint mobilization, Stair training, Electrical stimulation, Spinal mobilization, Cryotherapy, Moist heat, and Manual therapy  PLAN FOR NEXT SESSION: assess response to today session and progress strength and balance   Sumner Boast, PT 09/14/2022, 5:19 PM       [address]. [place], [place], [address] Phone: [phone]   Fax:  [phone]  [phone]

## 2022-09-14 NOTE — Telephone Encounter (Signed)
Left message to call back OOP cost is $315. PA good thru 03/08/22-03/09/23 Next injection due 09/17/22 or after.

## 2022-09-18 NOTE — Addendum Note (Signed)
Addended by: Kris Mouton on: 09/18/2022 10:37 AM   Modules accepted: Orders

## 2022-09-18 NOTE — Telephone Encounter (Signed)
Patient advised.  Lab 10/24 and NV 09/21/22

## 2022-09-18 NOTE — Addendum Note (Signed)
Addended by: Sumner Boast on: 09/18/2022 06:04 PM   Modules accepted: Orders

## 2022-09-18 NOTE — Telephone Encounter (Signed)
Left message and sent mychart message to the patient 

## 2022-09-19 ENCOUNTER — Ambulatory Visit: Payer: Medicare Other

## 2022-09-19 ENCOUNTER — Other Ambulatory Visit: Payer: Medicare Other

## 2022-09-19 ENCOUNTER — Ambulatory Visit: Payer: Medicare Other | Admitting: Physical Therapy

## 2022-09-19 DIAGNOSIS — R1311 Dysphagia, oral phase: Secondary | ICD-10-CM | POA: Diagnosis not present

## 2022-09-19 DIAGNOSIS — M6281 Muscle weakness (generalized): Secondary | ICD-10-CM

## 2022-09-19 DIAGNOSIS — R262 Difficulty in walking, not elsewhere classified: Secondary | ICD-10-CM

## 2022-09-19 DIAGNOSIS — R471 Dysarthria and anarthria: Secondary | ICD-10-CM

## 2022-09-19 DIAGNOSIS — G20B1 Parkinson's disease with dyskinesia, without mention of fluctuations: Secondary | ICD-10-CM

## 2022-09-19 DIAGNOSIS — G20A1 Parkinson's disease without dyskinesia, without mention of fluctuations: Secondary | ICD-10-CM | POA: Diagnosis not present

## 2022-09-19 NOTE — Therapy (Signed)
OUTPATIENT PHYSICAL THERAPY NEURO   Patient Name: [name] MRN: [id number] DOB:[birth year], 76 y.o., female Today's Date: 09/19/2022   PCP: Leary Roca PROVIDER: [name]   PT End of Session - 09/19/22 1138     Visit Number 4    Number of Visits 12    Date for PT Re-Evaluation 12/05/21    Authorization Type [employer] Medicare    PT Start Time 1138    PT Stop Time 1225    PT Time Calculation (min) 47 min             Past Medical History:  Diagnosis Date   Allergy    ANEMIA-NOS 09/25/2007   Anxiety    Arthritis    B12 DEFICIENCY 05/03/2007   Blood transfusion without reported diagnosis    CAD (coronary artery disease) 01/2010   MI, Nishan   Cardiomyopathy ([place]) 02/08/2010   H/o this 2012 after urosepsis, no recurrence.    Cataract    CHF (congestive heart failure) (HCC)    with episode of sepsis   Complication of anesthesia    Depression    not recently   FIBROMYALGIA 05/03/2007   Fibromyalgia    GERD 02/22/2010   Glaucoma 02/2013   [facility]   Headache    History of CHF (congestive heart failure) 01/2010   History of colon polyps 2004   HYPERLIPIDEMIA 12/19/2007   HYPOTENSION, ORTHOSTATIC 12/06/2008   Interstitial cystitis    [facility]tt[facility] now Dr Amalia Hailey   Lupus (systemic lupus erythematosus) ([place]) 02/08/2010   MCTD (mixed connective tissue disease) ([place]) 02/08/2010   OSTEOPOROSIS 08/2009   bisphosphonate on hold 2/2 dysphagia, on reclast done in August each year   Parkinson's disease 08/25/2015   Dx Dr [name] 07/2015    Pneumonia    PONV (postoperative nausea and vomiting)    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   Takotsubo cardiomyopathy 2008   due to E coli urosepsis   Past Surgical History:  Procedure Laterality Date   ABDOMINAL HYSTERECTOMY  1970s   IUD infection - first partial then with oophorectomy (cysts), complication - low blood pressure   CATARACT EXTRACTION Bilateral    CHOLECYSTECTOMY     complication - low  blood pressure   COLONOSCOPY  06/2008   h/o polyps but latest WNL, rec rpt 10 yrs Olevia Perches)   COLONOSCOPY  11/2018   multiple TAs (10 polyps total), rpt 2 yrs Fuller Plan)   COLONOSCOPY  04/2021   multiple TAs, rpt 3 yrs Fuller Plan)   CYSTOSCOPY  12/2013   abx treatment for recurrent cystitis   DEXA  04/2013   T -2.9 @ femur, -1.6 @ spine   DEXA  04/2017   T -2.9 hip, -0.7 spine   ESOPHAGOGASTRODUODENOSCOPY  12/2017   WNL, regardless esophagus dilated, small HH Fuller Plan)   EYE SURGERY     LUMBAR LAMINECTOMY/DECOMPRESSION MICRODISCECTOMY Right 11/27/2019   Right Lumbar Four-Five foraminotomy;  Erline Levine, MD)   LUMBAR LAMINECTOMY/DECOMPRESSION MICRODISCECTOMY Right 05/16/2022   Procedure: OPEN LUMBAR LAMINECTOMY, RT, L45 W/LATERAL RECESS DECOMPRESSION;  Surgeon: Karsten Ro, DO;  Location: [facility];  Service: Neurosurgery;  Laterality: Right;  3C   MINOR PLACEMENT OF FIDUCIAL N/A 06/30/2021   Procedure: MINOR PLACEMENT OF FIDUCIAL;  Surgeon: Erline Levine, MD;  Location: [facility];  Service: Neurosurgery;  Laterality: N/A;  Minor room   PTOSIS REPAIR Bilateral 10/2020   Plastic Surgery   PULSE GENERATOR IMPLANT Right 07/14/2021   Procedure: UNILATERAL PULSE  GENERATOR IMPLANT;  Surgeon: Erline Levine, MD;  Location: [facility];  Service: Neurosurgery;  Laterality: Right;   SUBTHALAMIC STIMULATOR INSERTION Bilateral 07/07/2021   Procedure: Deep brain stimulator placement;  Surgeon: Erline Levine, MD;  Location: [facility];  Service: Neurosurgery;  Laterality: Bilateral;   TUBAL LIGATION     UPPER GASTROINTESTINAL ENDOSCOPY     Patient Active Problem List   Diagnosis Date Noted   Lumbar radiculopathy, chronic 05/16/2022   Pre-op evaluation 04/26/2022   CAP (community acquired pneumonia) 03/29/2022   Localized swelling of chest wall 12/23/2021   Other fatigue 11/25/2021   Sinus pain 11/25/2021   Lymphadenopathy 11/25/2021   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 05/28/2021   Closed fracture of rib of  right side with routine healing 05/28/2021   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   Left shoulder pain 06/19/2020   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   Degenerative lumbar spinal stenosis 11/27/2019   Knee pain, bilateral 08/06/2018   Constipation 08/06/2018   Right lumbar radiculopathy 04/10/2018   Dysphagia 10/09/2017   Chronic cough 07/16/2017   GAD (generalized anxiety disorder) 04/28/2016   Chronic insomnia 03/07/2016   Left Achilles tendinitis 12/08/2015   Parkinson's disease 08/25/2015   Advanced care planning/counseling discussion 05/26/2015   Family circumstance 02/23/2015   Encounter for general adult medical examination with abnormal findings 05/21/2014   MDD (major depressive disorder), single episode, moderate ([place]) 11/12/2013   DDD (degenerative disc disease), lumbar 05/31/2013   Medicare annual wellness visit, subsequent 05/20/2013   HLD (hyperlipidemia) 05/20/2013   Vitamin D deficiency 05/09/2013   Recurrent UTI 01/03/2011   Rash and other nonspecific skin eruption 05/11/2010   GERD 02/22/2010   CAD (coronary artery disease) 01/25/2010   Osteoporosis 11/10/2009   Orthostatic hypotension 12/06/2008   Anemia 09/25/2007   Vitamin B12 deficiency 05/03/2007   Fibromyalgia 05/03/2007    ONSET DATE: 06/28/2022  REFERRING DIAG: Parkinson's, S/P lumbar surgery  THERAPY DIAG:  Parkinson's disease with dyskinesia, unspecified whether manifestations fluctuate  Difficulty in walking, not elsewhere classified  Muscle weakness (generalized)  Rationale for Evaluation and Treatment Rehabilitation  SUBJECTIVE:                                                                                                                                                                                              SUBJECTIVE STATEMENT:  Back is doing better, no pain so we are doing something  right Pt accompanied by: self  PERTINENT HISTORY: see above, did have lumbar surgery in June, has a deep brain stimulator  for movement disorder  PAIN:  Are you having pain? No  PRECAUTIONS: None  WEIGHT BEARING RESTRICTIONS No  FALLS: Has patient fallen in last 6 months? No  LIVING ENVIRONMENT: Lives with: lives with their family Lives in: House/apartment Stairs: Yes: Internal: 12 steps; can reach both Has following equipment at home: Single point cane and Walker - 4 wheeled  PLOF: Independent, does some housework  PATIENT GOALS:  be stronger, walk longer and better  OBJECTIVE:   COGNITION: Overall cognitive status: Within functional limits for tasks assessed   SENSATION: WFL   POSTURE: rounded shoulders, forward head, and decreased lumbar lordosis  LOWER EXTREMITY ROM:    Motions are WFL's but she has poor control of the right LE, weakness and dyskinesia  LOWER EXTREMITY MMT:    MMT Right Eval Left Eval  Hip flexion 3+ 4  Hip extension 4- 4  Hip abduction 4- 4  Hip adduction 4- 4  Hip internal rotation    Hip external rotation    Knee flexion 3+ 4  Knee extension 3+ 4  Ankle dorsiflexion 4- 4-  Ankle plantarflexion    Ankle inversion    Ankle eversion 3+ 4-  (Blank rows = not tested)  TRANSFERS:  Has to use hands, if not falls back into chair  GAIT: Gait pattern: has extraneous motions of the right arm and the right leg and the shoulders and head, off balance at times but typically corrects on own, right LE tends to circumduct and has decreased control of the knee and the ankle Distance walked: 400 feet Assistive device utilized: None Level of assistance: Complete Independence Comments: on stairs does one at a time  FUNCTIONAL TESTs:  5 times sit to stand: 21 seconds Timed up and go (TUG): 17 seconds BERG balance 39/56  TODAY'S TREATMENT:   09/19/22  Nustep L 5 6 min Knee ext 5# 2 sets 10 HC curl 15# 2 sets 10 Black tband trunk flex and  ext 2 sets 10 Seated row 20# 2 sets 10 Lat pull 20# 2 sets 10 20# resisted gait 4 x 4 way - MIn A d/t several stumbles STS on airex 10 x 2 sets- decreased initial standing with posterior lean and decreased eccentric lowering Ball toss on airex Side stepping over airex Alt step tap 6 inch 20 x 2 sets   09/14/22 Bike level 3 x 6 minutes Gait around the building 1 lap Feet on ball K2C, trunk rotation, small bridge, isometric abs Leg extension 5# 2x10 Leg curls 15# 2x10 Volley ball Walking ball toss Ball kicks Nustep level 5 x 5 minutes  1017/23 Nustep L 5 6 min Feet on ball bridge, KTC and obl 10 x Isometric abs with ball hold 3 sec 15 x 2# SLR 10 BIL then SLR with abd 10 x each STS with wt ball hold 10 x Black tband trunk flex and ext 2 sets 10 Seated row 15# 2 sets 10 Lat pull 15 # 2 sets 10 Knee ext 2 sets 10 5# HS curl 2 sets 10 15#    09/04/22 Nustep level 4 x 6 minutes   PATIENT EDUCATION: Education details: POC Person educated: Patient and Child(ren) Education method: Explanation Education comprehension: verbalized understanding   HOME EXERCISE PROGRAM: Advised to walk 10 - 20 minutes a day    GOALS: Goals reviewed with patient? Yes  SHORT TERM GOALS: Target date: 09/18/22  Independent with initial HEP  Goal status:met   LONG TERM GOALS: Target date: 11/27/22  Independent with advanced HEP Goal status: INITIAL  2.  Decrease TUG time to 12 seconds Goal status: INITIAL  3.  Increase BERG balance test score to 46/56 Goal status: INITIAL  4.  Increase right LE strength to 4-/5 Goal status: INITIAL  5.  Get up from sitting without using hands Goal status: INITIAL  ASSESSMENT:  CLINICAL IMPRESSION:  Continued to add activities that challenge her balance function and strength, she did very well with the balance, had several instance where she had  LOB was able to correct on own and a couple that needed assist to steady esp with resisted  gait  OBJECTIVE IMPAIRMENTS Abnormal gait, decreased activity tolerance, decreased balance, decreased coordination, decreased endurance, decreased mobility, difficulty walking, decreased ROM, decreased strength, increased muscle spasms, impaired flexibility, improper body mechanics, postural dysfunction, and pain.   REHAB POTENTIAL: Good  CLINICAL DECISION MAKING: Stable/uncomplicated  EVALUATION COMPLEXITY: Low  PLAN: PT FREQUENCY: 1-2x/week  PT DURATION: 12 weeks  PLANNED INTERVENTIONS: Therapeutic exercises, Therapeutic activity, Neuromuscular re-education, Balance training, Gait training, Patient/Family education, Self Care, Joint mobilization, Stair training, Electrical stimulation, Spinal mobilization, Cryotherapy, Moist heat, and Manual therapy  PLAN FOR NEXT SESSION: assess response to today session and progress strength and balance. Check goals   Aadith Raudenbush,ANGIE, PTA 09/19/2022, 11:39 AM       [address] [place], [place], [address] Phone: [phone]   Fax:  [phone]  [place] [place], [place], [address] Phone: [phone]   Fax:  [phone]  Patient Details  Name: [name] MRN: [id number] Date of Birth: 02-24-1946 Referring Provider:  [name], MD  Encounter Date: 09/19/2022   Laqueta Carina, PTA 09/19/2022, 11:39 AM  [address]. [place], [place], [address] Phone: [phone]   Fax:  [phone]

## 2022-09-19 NOTE — Therapy (Signed)
OUTPATIENT SPEECH LANGUAGE PATHOLOGY TREATMENT   Patient Name: [name] MRN: [id number] DOB:[birth year], 76 y.o., female Today's Date: 09/19/2022  PCP: [name], MD REFERRING PROVIDER: Alonza Bogus, DO   End of Session - 09/19/22 1344     Visit Number 2    Number of Visits 15    Date for SLP Re-Evaluation 12/11/22    SLP Start Time 1327   10 minutes late   SLP Stop Time  81    SLP Time Calculation (min) 33 min    Activity Tolerance Patient tolerated treatment well              Past Medical History:  Diagnosis Date   Allergy    ANEMIA-NOS 09/25/2007   Anxiety    Arthritis    B12 DEFICIENCY 05/03/2007   Blood transfusion without reported diagnosis    CAD (coronary artery disease) 01/2010   MI, Nishan   Cardiomyopathy ([place]) 02/08/2010   H/o this 2012 after urosepsis, no recurrence.    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   OSTEOPOROSIS 08/2009   bisphosphonate on hold 2/2 dysphagia, on reclast done in August each year   Parkinson's disease 08/25/2015   Dx Dr [name] 07/2015    Pneumonia    PONV (postoperative nausea and vomiting)    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   Takotsubo cardiomyopathy 2008   due to E coli urosepsis   Past Surgical History:  Procedure Laterality Date   ABDOMINAL HYSTERECTOMY  1970s   IUD infection - first partial then with oophorectomy (cysts), complication - low blood pressure    CATARACT EXTRACTION Bilateral    CHOLECYSTECTOMY     complication - low blood pressure   COLONOSCOPY  06/2008   h/o polyps but latest WNL, rec rpt 10 yrs Olevia Perches)   COLONOSCOPY  11/2018   multiple TAs (10 polyps total), rpt 2 yrs Fuller Plan)   COLONOSCOPY  04/2021   multiple TAs, rpt 3 yrs Fuller Plan)   CYSTOSCOPY  12/2013   abx treatment for recurrent cystitis   DEXA  04/2013   T -2.9 @ femur, -1.6 @ spine   DEXA  04/2017   T -2.9 hip, -0.7 spine   ESOPHAGOGASTRODUODENOSCOPY  12/2017   WNL, regardless esophagus dilated, small HH Fuller Plan)   EYE SURGERY     LUMBAR LAMINECTOMY/DECOMPRESSION MICRODISCECTOMY Right 11/27/2019   Right Lumbar Four-Five foraminotomy;  Erline Levine, MD)   LUMBAR LAMINECTOMY/DECOMPRESSION MICRODISCECTOMY Right 05/16/2022   Procedure: OPEN LUMBAR LAMINECTOMY, RT, L45 W/LATERAL RECESS DECOMPRESSION;  Surgeon: Karsten Ro, DO;  Location: [facility];  Service: Neurosurgery;  Laterality: Right;  3C   MINOR PLACEMENT OF FIDUCIAL N/A 06/30/2021   Procedure: MINOR PLACEMENT OF FIDUCIAL;  Surgeon: Erline Levine, MD;  Location: [facility];  Service: Neurosurgery;  Laterality: N/A;  Minor room   PTOSIS REPAIR Bilateral 10/2020   Plastic Surgery  PULSE GENERATOR IMPLANT Right 07/14/2021   Procedure: UNILATERAL PULSE GENERATOR IMPLANT;  Surgeon: Erline Levine, MD;  Location: [facility];  Service: Neurosurgery;  Laterality: Right;   SUBTHALAMIC STIMULATOR INSERTION Bilateral 07/07/2021   Procedure: Deep brain stimulator placement;  Surgeon: Erline Levine, MD;  Location: [facility];  Service: Neurosurgery;  Laterality: Bilateral;   TUBAL LIGATION     UPPER GASTROINTESTINAL ENDOSCOPY     Patient Active Problem List   Diagnosis Date Noted   Lumbar radiculopathy, chronic 05/16/2022   Pre-op evaluation 04/26/2022   CAP (community acquired pneumonia) 03/29/2022   Localized swelling of chest wall 12/23/2021   Other fatigue 11/25/2021   Sinus pain 11/25/2021   Lymphadenopathy 11/25/2021    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 05/28/2021   Closed fracture of rib of right side with routine healing 05/28/2021   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   Left shoulder pain 06/19/2020   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   Degenerative lumbar spinal stenosis 11/27/2019   Knee pain, bilateral 08/06/2018   Constipation 08/06/2018   Right lumbar radiculopathy 04/10/2018   Dysphagia 10/09/2017   Chronic cough 07/16/2017   GAD (generalized anxiety disorder) 04/28/2016   Chronic insomnia 03/07/2016   Left Achilles tendinitis 12/08/2015   Parkinson's disease 08/25/2015   Advanced care planning/counseling discussion 05/26/2015   Family circumstance 02/23/2015   Encounter for general adult medical examination with abnormal findings 05/21/2014   MDD (major depressive disorder), single episode, moderate ([place]) 11/12/2013   DDD (degenerative disc disease), lumbar 05/31/2013   Medicare annual wellness visit, subsequent 05/20/2013   HLD (hyperlipidemia) 05/20/2013   Vitamin D deficiency 05/09/2013   Recurrent UTI 01/03/2011   Rash and other nonspecific skin eruption 05/11/2010   GERD 02/22/2010   CAD (coronary artery disease) 01/25/2010   Osteoporosis 11/10/2009   Orthostatic hypotension 12/06/2008   Anemia 09/25/2007   Vitamin B12 deficiency 05/03/2007   Fibromyalgia 05/03/2007    ONSET DATE: Script written September 2023  REFERRING DIAG: G20 (ICD-10-CM) - Parkinson's disease   THERAPY DIAG:  Dysarthria and anarthria  Dysphagia, oral phase  Rationale for Evaluation and Treatment Rehabilitation  SUBJECTIVE:   SUBJECTIVE STATEMENT: "It's been hard to read cuz my eyes all of a sudden got blurry." Pt accompanied by: self  PERTINENT HISTORY: PMHx significant for Parkinsons disease w/ DBS 06/2021, Hx of orthostatic hypotension, anxiety/depression, RUE/RLE reflex sympathetic dystrophy,  osteoporosis, lupus, fibromyalgia, CHF and OA. Lumbar sx June 2023. Pt with evaluation for voice/dysarthria in Oct 2022 but did not return after eval due to medical reason.  PAIN:  Are you having pain? Yes: NPRS scale: 3/10 Pain location: eye Pain description: sore/ache   LIVING ENVIRONMENT: Lives with: lives with their family (son) Lives in: House/apartment  PLOF:  Level of assistance: Independent with ADLs, Comment: does most housework Employment: Retired  PATIENT GOALS: Talk louder  OBJECTIVE:   DIAGNOSTIC FINDINGS:  Clinical Impression - MBSS   05-10-22             Patient presents with a very mild oropharyngeal dysphagia with primary impairments being delays in anterior to posterior transit of solid texture boluses in oral cavity and mildly delayed pharyngeal peristalsis of puree and mechanical soft solids. She did exhibit mildly delayed swallow initiation of mechanical soft solids at level of vallecular sinus and trace residuals above UES opening. No other pharyngeal residuals observed with any of the tested consistencies (thin liquids, puree solids, mechanical  soft solids, 1/2 barium tablet. No penetration or aspiration observed with liquids or solids. Esophageal sweep did not reveal significant amount of barium stasis and esophageal transit appeared WFL. (this is an improvement as compared to 2018 MBS). Patient would cough after most swallows in absence of any observed penetration or aspiration. She did also have a cough prior to any PO's being given and when asked most recent time she ate, she reported that she has not had anything to eat or drink since last night. SLP reviewed MBS from 2018 which reports "no airway compromise despite frequent coughing immediately post-swallow". Unfortunately, results of this MBS do not explain patient's frequent coughing that occurs immediately after swallow PO's. Swallow Evaluation Recommendations  SLP Diet Recommendations: Regular solids;Thin  liquid  Liquid Administration via: Straw;Cup  Medication Administration: Whole meds with liquid  Compensations: Minimize environmental distractions;Slow rate;Small sips/bites   PATIENT REPORTED OUTCOME MEASURES (PROM): Communication Effectiveness Survey: to be completed in first 1-2 sessions   TODAY'S TREATMENT:  09/19/22: Evella has not done small sips at home.  Pt started with "hey"s and produced mid-upper 70s dB x2. SLP provided pt a model and she incr'd productions to mid 49s dB. In divergent naming tasks pt req'd rare min A for louder speech, and in semi-structured sentence response tasks pt req'd rare min A for loudness and loudness decay. SLP during this time reminded pt that her volume was normally loud (70-72 dB) and that was the loudness she produced prior to Parkinson's (PD).  SLP gave pt homework to generate 10 everyday sentences, to cont with HEY, and the reading (if she can do so).  09/12/22: SLP discussed results of evaluation with pt, and educated pt re: loud "hey", and her work to do at home - see pt instructions   PATIENT EDUCATION: Education details: see above in "today's treatment" Person educated: Patient Education method: Explanation, Demonstration, Verbal cues, and Handouts Education comprehension: verbalized understanding, returned demonstration, verbal cues required, and needs further education  HOME EXERCISE PROGRAM: 10 loud "hey", read out loud for 30 seconds x4, BID   GOALS: Goals reviewed with patient? No  SHORT TERM GOALS: Target date: 10/10/2022    pt will produce loud /a/ or "hey" with low 80s dB average in 3 sessions  Baseline:09-19-22 Goal status: Ongoing  2.  pt will demo abdominal breathing at rest 75% of the time in 3 sessions  Baseline:  Goal status: Ongoing  3.  Mercadies will answer 15-20 mod complex "wh" questions with average volume in upper 60s dB in 3 sessions  Baseline:  Goal status: Ongoing  4.  Pt will complete HEP 75% between  sessions Baseline:  Goal status: Ongoing   LONG TERM GOALS: Target date: 12/05/2022    pt will demonstrate abdominal breathing in simple Q and A responses 75% of the time in 3 sessions  Baseline:  Goal status: Ongoing  2.  A[place]in will produce speech volume in 3 minutes simple conversation with average mid-upper 60s dB in 3 sessions  Baseline:  Goal status: Ongoing  3.  Pt will produce speech volume of upper 60s dB in 5 minutes simple conversation with occasional nonverbal cues in 3 sessions  Baseline:  Goal status: Ongoing  4.  Pt will improve PROM measurement when compared to initial session, when given in last week of speech therapy Baseline:  Goal status: Ongoing    5.  SLP to monitor pt's intake of liquids to ensure pulmonary safety    Baseline:  Goal Status: Ongoing  ASSESSMENT:  CLINICAL IMPRESSION: Patient is a 76 y.o. female who was seen today for treatment of dysarthria/voice in light of PD. She indicates "everyone" asks her to repeat herself and it upsets her. SLP to monitor/possibly target aspiration precautions for pt's liquid intake to ensure pulmonary safety.  OBJECTIVE IMPAIRMENTS: Objective impairments include dysarthria and voice disorder. These impairments are limiting patient from household responsibilities, ADLs/IADLs, and effectively communicating at home and in community.Factors affecting potential to achieve goals and functional outcome are  none .Marland Kitchen Patient will benefit from skilled SLP services to address above impairments and improve overall function.  REHAB POTENTIAL: Good  PLAN:  SLP FREQUENCY: 2x/week  SLP DURATION: 12 weeks  PLANNED INTERVENTIONS: Aspiration precaution training, Pharyngeal strengthening exercises, Diet toleration management , Cueing hierachy, Internal/external aids, Oral motor exercises, Functional tasks, SLP instruction and feedback, Compensatory strategies, and Patient/family education    [facility], [place] 09/19/2022,  1:45 PM

## 2022-09-20 ENCOUNTER — Other Ambulatory Visit: Payer: Self-pay | Admitting: Family Medicine

## 2022-09-20 ENCOUNTER — Other Ambulatory Visit (INDEPENDENT_AMBULATORY_CARE_PROVIDER_SITE_OTHER): Payer: Medicare Other

## 2022-09-20 DIAGNOSIS — Z961 Presence of intraocular lens: Secondary | ICD-10-CM | POA: Diagnosis not present

## 2022-09-20 DIAGNOSIS — M810 Age-related osteoporosis without current pathological fracture: Secondary | ICD-10-CM

## 2022-09-20 LAB — BASIC METABOLIC PANEL
BUN: 15 mg/dL (ref 6–23)
CO2: 29 mEq/L (ref 19–32)
Calcium: 9.4 mg/dL (ref 8.4–10.5)
Chloride: 104 mEq/L (ref 96–112)
Creatinine, Ser: 0.7 mg/dL (ref 0.40–1.20)
GFR: 85.31 mL/min (ref >60.00)
Glucose, Bld: 118 mg/dL — ABNORMAL HIGH (ref 70–99)
Potassium: 3.6 mEq/L (ref 3.5–5.1)
Sodium: 140 mEq/L (ref 135–145)

## 2022-09-21 ENCOUNTER — Ambulatory Visit: Payer: Medicare Other | Admitting: Physical Therapy

## 2022-09-21 ENCOUNTER — Encounter: Payer: Self-pay | Admitting: Physical Therapy

## 2022-09-21 ENCOUNTER — Telehealth: Payer: Self-pay

## 2022-09-21 ENCOUNTER — Ambulatory Visit (INDEPENDENT_AMBULATORY_CARE_PROVIDER_SITE_OTHER): Payer: Medicare Other

## 2022-09-21 DIAGNOSIS — M810 Age-related osteoporosis without current pathological fracture: Secondary | ICD-10-CM | POA: Diagnosis not present

## 2022-09-21 DIAGNOSIS — Z23 Encounter for immunization: Secondary | ICD-10-CM

## 2022-09-21 DIAGNOSIS — G20A1 Parkinson's disease without dyskinesia, without mention of fluctuations: Secondary | ICD-10-CM | POA: Diagnosis not present

## 2022-09-21 DIAGNOSIS — R1311 Dysphagia, oral phase: Secondary | ICD-10-CM | POA: Diagnosis not present

## 2022-09-21 DIAGNOSIS — G20B1 Parkinson's disease with dyskinesia, without mention of fluctuations: Secondary | ICD-10-CM

## 2022-09-21 DIAGNOSIS — R262 Difficulty in walking, not elsewhere classified: Secondary | ICD-10-CM

## 2022-09-21 DIAGNOSIS — M6281 Muscle weakness (generalized): Secondary | ICD-10-CM

## 2022-09-21 DIAGNOSIS — R471 Dysarthria and anarthria: Secondary | ICD-10-CM | POA: Diagnosis not present

## 2022-09-21 MED ORDER — DENOSUMAB 60 MG/ML ~~LOC~~ SOSY
60.0000 mg | PREFILLED_SYRINGE | Freq: Once | SUBCUTANEOUS | Status: AC
  Administered 2022-09-21: 60 mg via SUBCUTANEOUS

## 2022-09-21 NOTE — Telephone Encounter (Signed)
Pt got my vm and came in for shot.

## 2022-09-21 NOTE — Progress Notes (Signed)
Per orders of Dr. Duncan, injection of Prolia given by Asaiah Hunnicutt. Patient tolerated injection well.  

## 2022-09-21 NOTE — Therapy (Signed)
OUTPATIENT PHYSICAL THERAPY NEURO   Patient Name: [name] MRN: [id number] DOB:[birth year], 76 y.o., female Today's Date: 09/21/2022   PCP: Leary Roca PROVIDER: [name]   PT End of Session - 09/21/22 0844     Visit Number 5    Date for PT Re-Evaluation 12/05/21    Authorization Type [employer] Medicare    PT Start Time 0843    PT Stop Time 0927    PT Time Calculation (min) 44 min    Activity Tolerance Patient tolerated treatment well    Behavior During Therapy [facility] for tasks assessed/performed             Past Medical History:  Diagnosis Date   Allergy    ANEMIA-NOS 09/25/2007   Anxiety    Arthritis    B12 DEFICIENCY 05/03/2007   Blood transfusion without reported diagnosis    CAD (coronary artery disease) 01/2010   MI, Nishan   Cardiomyopathy ([place]) 02/08/2010   H/o this 2012 after urosepsis, no recurrence.    Cataract    CHF (congestive heart failure) (HCC)    with episode of sepsis   Complication of anesthesia    Depression    not recently   FIBROMYALGIA 05/03/2007   Fibromyalgia    GERD 02/22/2010   Glaucoma 02/2013   [facility]   Headache    History of CHF (congestive heart failure) 01/2010   History of colon polyps 2004   HYPERLIPIDEMIA 12/19/2007   HYPOTENSION, ORTHOSTATIC 12/06/2008   Interstitial cystitis    [place] now Dr Amalia Hailey   Lupus (systemic lupus erythematosus) ([place]) 02/08/2010   MCTD (mixed connective tissue disease) ([place]) 02/08/2010   OSTEOPOROSIS 08/2009   bisphosphonate on hold 2/2 dysphagia, on reclast done in August each year   Parkinson's disease 08/25/2015   Dx Dr [name] 07/2015    Pneumonia    PONV (postoperative nausea and vomiting)    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   Takotsubo cardiomyopathy 2008   due to E coli urosepsis   Past Surgical History:  Procedure Laterality Date   ABDOMINAL HYSTERECTOMY  1970s   IUD infection - first partial then with oophorectomy (cysts), complication - low  blood pressure   CATARACT EXTRACTION Bilateral    CHOLECYSTECTOMY     complication - low blood pressure   COLONOSCOPY  06/2008   h/o polyps but latest WNL, rec rpt 10 yrs Olevia Perches)   COLONOSCOPY  11/2018   multiple TAs (10 polyps total), rpt 2 yrs Fuller Plan)   COLONOSCOPY  04/2021   multiple TAs, rpt 3 yrs Fuller Plan)   CYSTOSCOPY  12/2013   abx treatment for recurrent cystitis   DEXA  04/2013   T -2.9 @ femur, -1.6 @ spine   DEXA  04/2017   T -2.9 hip, -0.7 spine   ESOPHAGOGASTRODUODENOSCOPY  12/2017   WNL, regardless esophagus dilated, small HH Fuller Plan)   EYE SURGERY     LUMBAR LAMINECTOMY/DECOMPRESSION MICRODISCECTOMY Right 11/27/2019   Right Lumbar Four-Five foraminotomy;  Erline Levine, MD)   LUMBAR LAMINECTOMY/DECOMPRESSION MICRODISCECTOMY Right 05/16/2022   Procedure: OPEN LUMBAR LAMINECTOMY, RT, L45 W/LATERAL RECESS DECOMPRESSION;  Surgeon: Karsten Ro, DO;  Location: [place];  Service: Neurosurgery;  Laterality: Right;  3C   MINOR PLACEMENT OF FIDUCIAL N/A 06/30/2021   Procedure: MINOR PLACEMENT OF FIDUCIAL;  Surgeon: Erline Levine, MD;  Location: [place];  Service: Neurosurgery;  Laterality: N/A;  Minor room   PTOSIS REPAIR Bilateral 10/2020   Plastic Surgery  PULSE GENERATOR IMPLANT Right 07/14/2021   Procedure: UNILATERAL PULSE GENERATOR IMPLANT;  Surgeon: Erline Levine, MD;  Location: [place];  Service: Neurosurgery;  Laterality: Right;   SUBTHALAMIC STIMULATOR INSERTION Bilateral 07/07/2021   Procedure: Deep brain stimulator placement;  Surgeon: Erline Levine, MD;  Location: [place];  Service: Neurosurgery;  Laterality: Bilateral;   TUBAL LIGATION     UPPER GASTROINTESTINAL ENDOSCOPY     Patient Active Problem List   Diagnosis Date Noted   Lumbar radiculopathy, chronic 05/16/2022   Pre-op evaluation 04/26/2022   CAP (community acquired pneumonia) 03/29/2022   Localized swelling of chest wall 12/23/2021   Other fatigue 11/25/2021   Sinus pain 11/25/2021   Lymphadenopathy  11/25/2021   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 05/28/2021   Closed fracture of rib of right side with routine healing 05/28/2021   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   Left shoulder pain 06/19/2020   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   Degenerative lumbar spinal stenosis 11/27/2019   Knee pain, bilateral 08/06/2018   Constipation 08/06/2018   Right lumbar radiculopathy 04/10/2018   Dysphagia 10/09/2017   Chronic cough 07/16/2017   GAD (generalized anxiety disorder) 04/28/2016   Chronic insomnia 03/07/2016   Left Achilles tendinitis 12/08/2015   Parkinson's disease 08/25/2015   Advanced care planning/counseling discussion 05/26/2015   Family circumstance 02/23/2015   Encounter for general adult medical examination with abnormal findings 05/21/2014   MDD (major depressive disorder), single episode, moderate ([place]) 11/12/2013   DDD (degenerative disc disease), lumbar 05/31/2013   Medicare annual wellness visit, subsequent 05/20/2013   HLD (hyperlipidemia) 05/20/2013   Vitamin D deficiency 05/09/2013   Recurrent UTI 01/03/2011   Rash and other nonspecific skin eruption 05/11/2010   GERD 02/22/2010   CAD (coronary artery disease) 01/25/2010   Osteoporosis 11/10/2009   Orthostatic hypotension 12/06/2008   Anemia 09/25/2007   Vitamin B12 deficiency 05/03/2007   Fibromyalgia 05/03/2007    ONSET DATE: 06/28/2022  REFERRING DIAG: Parkinson's, S/P lumbar surgery  THERAPY DIAG:  Parkinson's disease with dyskinesia, unspecified whether manifestations fluctuate  Difficulty in walking, not elsewhere classified  Muscle weakness (generalized)  Rationale for Evaluation and Treatment Rehabilitation  SUBJECTIVE:                                                                                                                                                                                               SUBJECTIVE STATEMENT:  I am surprised that my back is feeling really better.  I am stiff in the morning and get tired easily Pt  accompanied by: self  PERTINENT HISTORY: see above, did have lumbar surgery in June, has a deep brain stimulator for movement disorder  PAIN:  Are you having pain? No  PRECAUTIONS: None  WEIGHT BEARING RESTRICTIONS No  FALLS: Has patient fallen in last 6 months? No  LIVING ENVIRONMENT: Lives with: lives with their family Lives in: House/apartment Stairs: Yes: Internal: 12 steps; can reach both Has following equipment at home: Single point cane and Walker - 4 wheeled  PLOF: Independent, does some housework  PATIENT GOALS:  be stronger, walk longer and better  OBJECTIVE:   COGNITION: Overall cognitive status: Within functional limits for tasks assessed   SENSATION: WFL   POSTURE: rounded shoulders, forward head, and decreased lumbar lordosis  LOWER EXTREMITY ROM:    Motions are WFL's but she has poor control of the right LE, weakness and dyskinesia  LOWER EXTREMITY MMT:    MMT Right Eval Left Eval  Hip flexion 3+ 4  Hip extension 4- 4  Hip abduction 4- 4  Hip adduction 4- 4  Hip internal rotation    Hip external rotation    Knee flexion 3+ 4  Knee extension 3+ 4  Ankle dorsiflexion 4- 4-  Ankle plantarflexion    Ankle inversion    Ankle eversion 3+ 4-  (Blank rows = not tested)  TRANSFERS:  Has to use hands, if not falls back into chair  GAIT: Gait pattern: has extraneous motions of the right arm and the right leg and the shoulders and head, off balance at times but typically corrects on own, right LE tends to circumduct and has decreased control of the knee and the ankle Distance walked: 400 feet Assistive device utilized: None Level of assistance: Complete Independence Comments: on stairs does one at a time  FUNCTIONAL TESTs:  5 times sit to stand: 21 seconds Timed up and go (TUG): 17 seconds BERG balance  39/56  TODAY'S TREATMENT:  09/21/22 Walk around the building in the back good pace, some extra motions are more pronounced this AM Nustep level 5 x 6 minutes UBE level 3 x 5 minutes 5# straight arm pulls Leg press 20#  2x10 Red tband DF/PF Volleyball On airex cone toe touches Side stepping back wards walk, direction changes Walking ball toss fwd and backward  09/19/22  Nustep L 5 6 min Knee ext 5# 2 sets 10 HC curl 15# 2 sets 10 Black tband trunk flex and ext 2 sets 10 Seated row 20# 2 sets 10 Lat pull 20# 2 sets 10 20# resisted gait 4 x 4 way - MIn A d/t several stumbles STS on airex 10 x 2 sets- decreased initial standing with posterior lean and decreased eccentric lowering Ball toss on airex Side stepping over airex Alt step tap 6 inch 20 x 2 sets   09/14/22 Bike level 3 x 6 minutes Gait around the building 1 lap Feet on ball K2C, trunk rotation, small bridge, isometric abs Leg extension 5# 2x10 Leg curls 15# 2x10 Volley ball Walking ball toss Ball kicks Nustep level 5 x 5 minutes  1017/23 Nustep L 5 6 min Feet on ball bridge, KTC and obl 10 x Isometric abs with ball hold 3 sec 15 x 2# SLR 10 BIL then SLR with abd 10 x each STS with wt ball hold 10 x Black tband trunk flex and ext 2 sets 10 Seated row 15# 2 sets 10 Lat pull 15 # 2 sets 10 Knee ext 2 sets 10 5# HS curl  2 sets 10 15#    09/04/22 Nustep level 4 x 6 minutes   PATIENT EDUCATION: Education details: POC Person educated: Patient and Child(ren) Education method: Explanation Education comprehension: verbalized understanding   HOME EXERCISE PROGRAM: Advised to walk 10 - 20 minutes a day    GOALS: Goals reviewed with patient? Yes  SHORT TERM GOALS: Target date: 09/18/22  Independent with initial HEP  Goal status:met   LONG TERM GOALS: Target date: 11/27/22  Independent with advanced HEP Goal status: progressing  2.  Decrease TUG time to 12 seconds Goal status:  progressing  3.  Increase BERG balance test score to 46/56 Goal status: INITIAL  4.  Increase right LE strength to 4-/5 Goal status: INITIAL  5.  Get up from sitting without using hands Goal statuspartially met  ASSESSMENT:  CLINICAL IMPRESSION:  Patient feeling less back pain, this appointment was in the morning and more excessive motions were noted with walking.  She really struggled on the airex with movements but just static standing was good.  OBJECTIVE IMPAIRMENTS Abnormal gait, decreased activity tolerance, decreased balance, decreased coordination, decreased endurance, decreased mobility, difficulty walking, decreased ROM, decreased strength, increased muscle spasms, impaired flexibility, improper body mechanics, postural dysfunction, and pain.   REHAB POTENTIAL: Good  CLINICAL DECISION MAKING: Stable/uncomplicated  EVALUATION COMPLEXITY: Low  PLAN: PT FREQUENCY: 1-2x/week  PT DURATION: 12 weeks  PLANNED INTERVENTIONS: Therapeutic exercises, Therapeutic activity, Neuromuscular re-education, Balance training, Gait training, Patient/Family education, Self Care, Joint mobilization, Stair training, Electrical stimulation, Spinal mobilization, Cryotherapy, Moist heat, and Manual therapy  PLAN FOR NEXT SESSION: assess response to today session and progress strength and balance. Check goals   Sumner Boast, PT 09/21/2022, 8:44 AM       [address] [place], [place], [address] Phone: [phone]   Fax:  [phone]

## 2022-09-21 NOTE — Telephone Encounter (Signed)
Florinef last filled:  08/21/22, #30 Vit B12 last filled:  06/15/22, #3 mL Last OV:  04/26/22, pre-op exam Next OV:  none

## 2022-09-21 NOTE — Telephone Encounter (Signed)
Lvm asking pt to call back.  Need to inform pt Dr. Darnell Level says she's ok to get Prolia shot today.  (See 09/20/22 Labs, Result Notes.)

## 2022-09-22 ENCOUNTER — Ambulatory Visit: Payer: Medicare Other

## 2022-09-22 NOTE — Progress Notes (Signed)
Assessment/Plan:    1.  Parkinsons Disease             -Patient completed neurocognitive testing on January 25, 2021 with Dr. Nicole Kindred.  Evidence of MCI only.  -Patient is status post bilateral STN DBS on July 07, 2021 with IPG placement on July 14, 2021.    -Continue carbidopa/levodopa 25/100, 1 tab twice per day  2.  Dyskinesia             -Resolved 3.  Neurogenic Orthostatic Hypotension             -Did well on Northera.  Patient stopped that on her own.  Now on Florinef 0.1 mg daily 4.  Depression             -On sertraline.  Doing well. 5.  b12 deficiency             -on injections. 6.  Urinary incontinence             -on myrbetriq.  She is doing well in that regard 7.  Lumbar radiculopathy             -She is status post lumbar foraminotomy with Dr. Vertell Limber on November 27, 2019.  Pain has reemerged and she underwent L4-L5 laminectomy with Dr. Reatha Armour on May 16, 2022. 8.  Depression  -On sertraline, 100 mg daily. 9.  Dysphagia  -MBE done in June, 2023 with mild oropharyngeal dysphagia.  Regular diet with thin liquids recommended.   Subjective:   [name] was seen today in follow up for levodopa challenge.  My previous records were reviewed prior to todays visit as well as outside records available to me.  Last visit, the patient had a superficial burn.  She was charging, and I told her to make sure she keeps clothing between the charger.  She is doing better in that regard and skin is healed.  We increased her levodopa to twice daily dosing and sent her to physical therapy.  She states that ***.  Current prescribed movement disorder medications: Carbidopa/levodopa 25/100, 1 tablet twice per day   PREVIOUS MEDICATIONS: northera (worked well but pt d/c when they quit sending b/c she owed $); amantadine (discontinued because of hallucinations); pramipexole (decreased in past because of hallucinations/dyskinesia); clonazepam (stopped it because of fatigue, but also because  she just did not need it any longer)  ALLERGIES:   Allergies  Allergen Reactions   Iohexol Hives and Shortness Of Breath     Code: HIVES, Desc: PT developed 2 hives, followed by SOB, severe headache post 87cc's Omnipaque 300., Onset Date: [id number]    Amitriptyline Other (See Comments)    Sedated next morning   Ciprofloxacin Nausea And Vomiting   Cymbalta [Duloxetine Hcl] Other (See Comments)    Worsened depression   Lyrica [Pregabalin] Other (See Comments)    Tried during hospitalization - unsure effects but unable to tolerate   Imipramine Hcl Rash   Iodine Rash   Lidocaine Hcl Rash   Morphine Sulfate Rash   Neosporin [Neomycin-Bacitracin Zn-Polymyx] Rash and Other (See Comments)    Worsened skin breaking out   Sulfamethoxazole Rash   Tetracyclines & Related Rash    CURRENT MEDICATIONS:  Outpatient Encounter Medications as of 09/25/2022  Medication Sig   acetaminophen (TYLENOL) 650 MG CR tablet Take 2 tablets (1,300 mg total) by mouth every 8 (eight) hours as needed for pain.   AMBULATORY NON FORMULARY MEDICATION Lift chair Dx:  [name]  Calcium Carbonate-Vitamin D (CALCIUM 600/VITAMIN D) 600-400 MG-UNIT chew tablet Chew 1 tablet by mouth daily.   carbidopa-levodopa (SINEMET IR) 25-100 MG tablet 1 po tid prn (Patient taking differently: Take 1 tablet by mouth in the morning and at bedtime.)   Cholecalciferol (VITAMIN D3) 25 MCG (1000 UT) CAPS Take 1 capsule (1,000 Units total) by mouth daily.   clonazePAM (KLONOPIN) 0.5 MG tablet TAKE 0.5 TABLETS (0.25 MG TOTAL) BY MOUTH EVERY MORNING AND 1 TABLET (0.5 MG TOTAL) AT BEDTIME.   clotrimazole-betamethasone (LOTRISONE) cream Apply 1 application topically daily.   cyanocobalamin (,VITAMIN B-12,) 1000 MCG/ML injection Inject 1 mL (1,000 mcg total) into the muscle every 30 (thirty) days.   denosumab (PROLIA) 60 MG/ML SOSY injection Inject 60 mg into the skin every 6 (six) months.   fludrocortisone (FLORINEF) 0.1 MG tablet TAKE 1 TABLET BY  MOUTH EVERY DAY   Glycerin-Hypromellose-PEG 400 (ARTIFICIAL TEARS) 0.2-0.2-1 % SOLN Place 1 drop into both eyes daily as needed (dry eyes).   latanoprost (XALATAN) 0.005 % ophthalmic solution Place 1 drop into both eyes at bedtime.   sertraline (ZOLOFT) 100 MG tablet TAKE 1 TABLET BY MOUTH DAILY   SYRINGE-NEEDLE, DISP, 3 ML (BD SAFETYGLIDE SYRINGE/NEEDLE) 25G X 1" 3 ML MISC Use to inject vitamin B12 monthly.   traMADol (ULTRAM) 50 MG tablet TAKE 1 TABLET (50 MG TOTAL) BY MOUTH EVERY 6 (SIX) HOURS AS NEEDED FOR MODERATE PAIN   No facility-administered encounter medications on file as of 09/25/2022.    Objective:   PHYSICAL EXAMINATION:    VITALS:   There were no vitals filed for this visit.    GEN:  The patient appears stated age and is in NAD. HEENT:  Normocephalic, atraumatic.  The mucous membranes are moist. The superficial temporal arteries are without ropiness or tenderness. CV:  RRR Lungs:  CTAB Neck/HEME:  There are no carotid bruits bilaterally. Skin:  erythema/rash over the charging site.  No evidence of infection.  No crepitance.  Neurological examination:  Orientation: The patient is alert and oriented x3. Cranial nerves: There is chronic L facial droop.  Smile is symmetric. The speech is fluent and hypophonic and occasionally dysarthric. Soft palate rises symmetrically and there is no tongue deviation. Hearing is intact to conversational tone. Sensation: Sensation is intact to light touch throughout Motor: Strength is at least antigravity x4.  Movement examination: Tone: There is normal tone in the upper and lower extremities before and after programming Abnormal movements: Rare left upper extremity rest tremor (same as previous); when somewhat stressed (when we were talking to the [facility] Scientific rep about the rash, she had some tremor on the right.  R hand is dystonic, mild Coordination:  There is mild decremation on the R Gait and Station: She is short staffed.   She is not really dragging the legs today.  She is shuffling a little bit.    Chemistry      Component Value Date/Time   NA 140 09/20/2022 1402   K 3.6 09/20/2022 1402   CL 104 09/20/2022 1402   CO2 29 09/20/2022 1402   BUN 15 09/20/2022 1402   CREATININE 0.70 09/20/2022 1402   CREATININE 0.87 10/08/2019 0921      Component Value Date/Time   CALCIUM 9.4 09/20/2022 1402   ALKPHOS 64 11/02/2021 1058   AST 14 11/02/2021 1058   ALT 5 11/02/2021 1058   BILITOT 0.3 11/02/2021 1058       Lab Results  Component Value Date   WBC  5.8 04/26/2022   HGB 12.1 04/26/2022   HCT 37.5 04/26/2022   MCV 86.9 04/26/2022   PLT 273.0 04/26/2022    Lab Results  Component Value Date   TSH 0.49 10/04/2018    Total time spent on today's visit was ***  minutes, including both face-to-face time and nonface-to-face time.  Time included that spent on review of records (prior notes available to me/labs/imaging if pertinent), discussing treatment and goals, answering patient's questions and coordinating care.  This did not include DBS time.     Cc:  [name], MD

## 2022-09-25 ENCOUNTER — Ambulatory Visit: Payer: Medicare Other | Admitting: Neurology

## 2022-09-25 ENCOUNTER — Encounter: Payer: Self-pay | Admitting: Neurology

## 2022-09-25 VITALS — BP 127/78 | HR 78 | Ht 66.0 in | Wt 156.4 lb

## 2022-09-25 DIAGNOSIS — Z9689 Presence of other specified functional implants: Secondary | ICD-10-CM | POA: Diagnosis not present

## 2022-09-25 DIAGNOSIS — G20B1 Parkinson's disease with dyskinesia, without mention of fluctuations: Secondary | ICD-10-CM | POA: Diagnosis not present

## 2022-09-25 NOTE — Procedures (Signed)
DBS Programming was performed.    Manufacturer of DBS device: [employer], bluetooth  Total time spent programming was 8 minutes.  Device was confirmed to be on.  Soft start was confirmed to be on.  Impedences were checked and were within normal limits.  Battery was checked and was determined to be functioning normally and not near the end of life.  Final settings were as follows:    Active Contact Amplitude (mA) PW (ms) Frequency (hz) Side Effects  Left Brain       08/08/21 3-C+ 1.7 60 130   Other trials        1-C+ 2.7 60 130 tremor   (2/3/4)-C+ 3.0 60 130 Face pull above 2.1   (5/6/7)-C+ 1.8 60 130 ? Face pull (some baseline facial asymmetry)  08/22/21 5-(25%)7-(75%)C+ 2.5 60 159   09/05/21 final 5-(25%)6-(75%)C+ 2.9 60 159   Other trials 5-(50%)6-(50%)C+ 2.9 60 159    5-(25%)7-(75%)C+ 3.5 60 159 Tremor not controlled  10/31/21 5-(25%)7-(75%)C+ 3.3 60 159   05/01/22 5-(25%)7-(75%)C+ 3.6 60 159 Transient lip tingle  08/11/22 5-(25%)7-(75%)C+ 3.7 60 159   09/25/22 6-(25%)7-(75%)C+ 3.7 60 159          Right Brain       08/08/21 (5/6/7)-C+ 1.6 60 130   Other trials 1-C+ 1.5 60 130 ? Speech change   (2/3/4)-C+ 1.5 60 130 Pretty good   8-C+ 1.6 60 130 tremor  08/22/21 8-C+ 2.7 60 136   09/05/21 final 6-(50%)8-(50%)C+ 2.9 60 159   others 6-(60%)8-(40%)C+ 2.8  60 136 Mouth dyskinesia  10/31/21 6-(50%)8-(50%)C+ 3.4 60 159   05/01/22 6-(50%)8-(50%)C+ 3.6 60 159   08/11/22 6-(50%)8-(50%)C+ 3.5 60 159   09/25/22 6-(50%)8-(50%)C+ 3.5 60 159

## 2022-09-25 NOTE — Patient Instructions (Signed)
Local and Online Resources for Power over Parkinson's Group  October 2023    LOCAL [place] PARKINSON'S GROUPS   Power over Parkinson's Group:    Power Over Parkinson's Patient Education Group will be Wednesday, October 11th-*Hybrid meting*- in person at [facility] location and via [facility] at 2 pm.   Upcoming Power over [employer] Meetings:  2nd Wednesdays of the month at 2 pm:   October 11th, November 8th, December 13th  Contact Amy Marriott at [email or web address] if interested in participating in this group    [place]! Moves [employer] Instructor-Led Classes offering at [employer]!  TUESDAYS and Wednesdays 1-2 pm.   Contact Vonna Kotyk at  [email or web address] or Caron Presume at [place], [email or web address]  Dance for Parkinson 's classes will be on Tuesdays 9:30am-10:30am starting October 3-December 12 with a break the week of November 21st. Located in the [employer]  which is in the first floor of the [employer] ([place].) To register:  [email or web address] or [phone]  Drumming for Parkinson's will be held on 2nd and 4th Mondays at 11:00 am.   Located at the [place] ([place]  [place] at [email or web address] or [phone]  Through support from the [place] for Parkinson's classes are free for both patients and caregivers.    [employer] Parkinson's Tai Chi Class, Mondays at 11 am.  Call [phone] for details   [place]:  [email or web address]  PD Health at Home continues:  Mindfulness Mondays, Wellness Wednesdays, Fitness Fridays   Upcoming Education:    Parkinson's 101:  What you and your family should know.  Wednesday, Oct. 4th 1-2 pm  Expert Briefing:     Parkinson's and the Gut-Brain Connection.   Wednesday, Oct. 11th 1-2 pm  Hallucinations and Delusions in Parkinson's.  Wednesday, Nov. 8th, 1-2 pm  Register for expert briefings (webinars) at [email or web address]  Please check out their website to sign up for emails and see their full online offerings      [place]:  [email or web address]   Third Thursday Webinars:  On the third Thursday of every month at 12 p.m. ET, join our free live webinars to learn about various aspects of living with Parkinson's disease and our work to speed medical breakthroughs.  Upcoming Webinar:  Surveyor, mining for [employer]. (Replay).  Thursday, Oct. 12th at 12 noon  Check out additional information on their website to see their full online offerings    [place]:  [email or web address]  Upcoming Webinar:   Stay tuned  Webinar Series:  Living with Parkinson's Meetup.   Third Thursdays each month, 3 pm  Care Partner Monthly Meetup.  With Robin Searing Phinney.  First Tuesday of each month, 2 pm  Check out additional information to Live Well Today on their website    Parkinson and Movement Disorders (PMD) Alliance:  www.[email or web address]  [employer] Online:  Online Education Events  Sign up for emails, which are sent weekly to give you updates on programming and online offerings    Parkinson's Association of the [place]:  www.[email or web address]  Information on online support groups, education events, and online exercises including Yoga, Parkinson's exercises and more-LOTS of information on links to PD resources and online events  Virtual Support Group through Parkinson's Association of the [place]; next one is scheduled for Wednesday, October 4th at 2 pm.  (  These are typically scheduled for the 1st Wednesday of the month at 2 pm).  Visit website for details.   MOVEMENT AND EXERCISE OPPORTUNITIES  PWR! Moves Classes  at [place].  Wednesdays 10 and 11 am.   Contact Amy Marriott, PT [email or web address] if interested.  NEW PWR! Moves Class offerings at [employer].  *TUESDAYS* and Wednesdays 1-2 pm.  Contact Vonna Kotyk at  [email or web address] or Caron Presume at [place],  [email or web address]  Parkinson's Wellness Recovery (PWR! Moves)  www.[email or web address]  Info on the PWR! Virtual Experience:  You will have access to our expertise?through self-assessment, guided plans that start with the PD-specific fundamentals, educational content, tips, Q&A with an expert, and a growing Art therapist of PD-specific pre-recorded and live exercise classes of varying types and intensity - both physical and cognitive! If that is not enough, we offer 1:1 wellness consultations (in-person or virtual) to personalize your PWR! Research scientist (medical).   [place] Fridays:   As part of the PD Health @ Home program, this free video series focuses each week on one aspect of fitness designed to support people living with Parkinson's.? These weekly videos highlight the Bennett fitness guidelines for people with Parkinson's disease.  [email or web address]  Dance for PD website is offering free, live-stream classes throughout the week, as well as links to [employer] of classes:  [email or web address]  Virtual dance and Pilates for Parkinson's classes: Click on the Community Tab> Parkinson's Movement Initiative Tab.  To register for classes and for more information, visit [email or web address] and click the "community" tab.   [employer] Parkinson's Cycling Classes   [employer]:  Thursdays @ Noon-Live classes at [employer] ([employer] at [email or web address]?or [phone])  [facility] [facility]: Virtual Classes Mondays and Thursdays Jeanette Caprice classes Tuesday, Wednesday and Thursday (contact [place] at  [email or web address] ?or [phone])  [place]  Varied levels of classes are offered Tuesdays and Thursdays at [employer].   Stretching with Verdis Frederickson weekly class is also offered for people with Parkinson's  To observe a class or for more information, call [phone] or email Hezzie Bump at [email or web address]   ADDITIONAL SUPPORT AND RESOURCES  Well[employer]Spring [employer]:Online Caregiver Education Opportunities:  www.well-[email or web address]/caregiver-education/caregiver-support-group.  You may also contact Vickki Muff at [email or web address] or [phone].     Well-Spring Navigator:  Just1Navigator program, a?free service to help individuals and families through the journey of determining care for older adults.  The "Navigator" is a Education officer, museum, Arnell Asal, who will speak with a prospective client and/or loved ones to provide an assessment of the situation and a set of recommendations for a personalized care plan -- all free of charge, and whether?Well-Spring [employer] offers the needed service or not. If the need is not a service we provide, we are well-connected with reputable programs in town that we can refer you to.  www.well-springsolutions.org or to speak with the Navigator, call [phone].

## 2022-09-26 ENCOUNTER — Encounter: Payer: Self-pay | Admitting: Physical Therapy

## 2022-09-26 ENCOUNTER — Other Ambulatory Visit: Payer: Self-pay | Admitting: Family Medicine

## 2022-09-26 ENCOUNTER — Ambulatory Visit: Payer: Medicare Other | Admitting: Physical Therapy

## 2022-09-26 ENCOUNTER — Encounter: Payer: Self-pay | Admitting: Family Medicine

## 2022-09-26 DIAGNOSIS — G20B1 Parkinson's disease with dyskinesia, without mention of fluctuations: Secondary | ICD-10-CM | POA: Diagnosis not present

## 2022-09-26 DIAGNOSIS — R262 Difficulty in walking, not elsewhere classified: Secondary | ICD-10-CM | POA: Diagnosis not present

## 2022-09-26 DIAGNOSIS — M6281 Muscle weakness (generalized): Secondary | ICD-10-CM | POA: Diagnosis not present

## 2022-09-26 DIAGNOSIS — R1311 Dysphagia, oral phase: Secondary | ICD-10-CM | POA: Diagnosis not present

## 2022-09-26 DIAGNOSIS — G20A1 Parkinson's disease without dyskinesia, without mention of fluctuations: Secondary | ICD-10-CM | POA: Diagnosis not present

## 2022-09-26 DIAGNOSIS — R471 Dysarthria and anarthria: Secondary | ICD-10-CM | POA: Diagnosis not present

## 2022-09-26 MED ORDER — CLONAZEPAM 0.5 MG PO TABS
ORAL_TABLET | ORAL | 0 refills | Status: DC
  Filled 2022-09-26: qty 45, 30d supply, fill #0

## 2022-09-26 MED ORDER — TRAMADOL HCL 50 MG PO TABS
50.0000 mg | ORAL_TABLET | Freq: Four times a day (QID) | ORAL | 0 refills | Status: DC | PRN
  Filled 2022-09-26: qty 30, 8d supply, fill #0

## 2022-09-26 NOTE — Telephone Encounter (Signed)
ERx 

## 2022-09-26 NOTE — Therapy (Signed)
OUTPATIENT PHYSICAL THERAPY NEURO   Patient Name: [name] MRN: [id number] DOB:1946-04-16, 76 y.o., female Today's Date: 09/26/2022   PCP: Leary Roca PROVIDER: [name]   PT End of Session - 09/26/22 1020     Visit Number 6    Date for PT Re-Evaluation 12/05/21    Authorization Type UHC Medicare    PT Start Time 8887    PT Stop Time 1100    PT Time Calculation (min) 45 min    Activity Tolerance Patient tolerated treatment well    Behavior During Therapy [facility] for tasks assessed/performed             Past Medical History:  Diagnosis Date   Allergy    ANEMIA-NOS 09/25/2007   Anxiety    Arthritis    B12 DEFICIENCY 05/03/2007   Blood transfusion without reported diagnosis    CAD (coronary artery disease) 01/2010   MI, Nishan   Cardiomyopathy ([place]) 02/08/2010   H/o this 2012 after urosepsis, no recurrence.    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   OSTEOPOROSIS 08/2009   bisphosphonate on hold 2/2 dysphagia, on reclast done in August each year   Parkinson's disease 08/25/2015   Dx Dr [name] 07/2015    Pneumonia    PONV (postoperative nausea and vomiting)    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   Takotsubo cardiomyopathy 2008   due to E coli urosepsis   Past Surgical History:  Procedure Laterality Date   ABDOMINAL HYSTERECTOMY  1970s   IUD infection - first partial then with oophorectomy (cysts), complication - low  blood pressure   CATARACT EXTRACTION Bilateral    CHOLECYSTECTOMY     complication - low blood pressure   COLONOSCOPY  06/2008   h/o polyps but latest WNL, rec rpt 10 yrs Olevia Perches)   COLONOSCOPY  11/2018   multiple TAs (10 polyps total), rpt 2 yrs Fuller Plan)   COLONOSCOPY  04/2021   multiple TAs, rpt 3 yrs Fuller Plan)   CYSTOSCOPY  12/2013   abx treatment for recurrent cystitis   DEXA  04/2013   T -2.9 @ femur, -1.6 @ spine   DEXA  04/2017   T -2.9 hip, -0.7 spine   ESOPHAGOGASTRODUODENOSCOPY  12/2017   WNL, regardless esophagus dilated, small HH Fuller Plan)   EYE SURGERY     LUMBAR LAMINECTOMY/DECOMPRESSION MICRODISCECTOMY Right 11/27/2019   Right Lumbar Four-Five foraminotomy;  Erline Levine, MD)   LUMBAR LAMINECTOMY/DECOMPRESSION MICRODISCECTOMY Right 05/16/2022   Procedure: OPEN LUMBAR LAMINECTOMY, RT, L45 W/LATERAL RECESS DECOMPRESSION;  Surgeon: Karsten Ro, DO;  Location: [place];  Service: Neurosurgery;  Laterality: Right;  3C   MINOR PLACEMENT OF FIDUCIAL N/A 06/30/2021   Procedure: MINOR PLACEMENT OF FIDUCIAL;  Surgeon: Erline Levine, MD;  Location: [place];  Service: Neurosurgery;  Laterality: N/A;  Minor room   PTOSIS REPAIR Bilateral 10/2020   Plastic Surgery  PULSE GENERATOR IMPLANT Right 07/14/2021   Procedure: UNILATERAL PULSE GENERATOR IMPLANT;  Surgeon: Erline Levine, MD;  Location: [place];  Service: Neurosurgery;  Laterality: Right;   SUBTHALAMIC STIMULATOR INSERTION Bilateral 07/07/2021   Procedure: Deep brain stimulator placement;  Surgeon: Erline Levine, MD;  Location: [place];  Service: Neurosurgery;  Laterality: Bilateral;   TUBAL LIGATION     UPPER GASTROINTESTINAL ENDOSCOPY     Patient Active Problem List   Diagnosis Date Noted   Lumbar radiculopathy, chronic 05/16/2022   Pre-op evaluation 04/26/2022   CAP (community acquired pneumonia) 03/29/2022   Localized swelling of chest wall 12/23/2021   Other fatigue 11/25/2021   Sinus pain 11/25/2021   Lymphadenopathy  11/25/2021   Parkinson's disease with dyskinesia 05/28/2021   Closed fracture of rib of right side with routine healing 05/28/2021   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   Left shoulder pain 06/19/2020   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   Degenerative lumbar spinal stenosis 11/27/2019   Knee pain, bilateral 08/06/2018   Constipation 08/06/2018   Right lumbar radiculopathy 04/10/2018   Dysphagia 10/09/2017   Chronic cough 07/16/2017   GAD (generalized anxiety disorder) 04/28/2016   Chronic insomnia 03/07/2016   Left Achilles tendinitis 12/08/2015   Parkinson's disease 08/25/2015   Advanced care planning/counseling discussion 05/26/2015   Family circumstance 02/23/2015   Encounter for general adult medical examination with abnormal findings 05/21/2014   MDD (major depressive disorder), single episode, moderate ([place]) 11/12/2013   DDD (degenerative disc disease), lumbar 05/31/2013   Medicare annual wellness visit, subsequent 05/20/2013   HLD (hyperlipidemia) 05/20/2013   Vitamin D deficiency 05/09/2013   Recurrent UTI 01/03/2011   Rash and other nonspecific skin eruption 05/11/2010   GERD 02/22/2010   CAD (coronary artery disease) 01/25/2010   Osteoporosis 11/10/2009   Orthostatic hypotension 12/06/2008   Anemia 09/25/2007   Vitamin B12 deficiency 05/03/2007   Fibromyalgia 05/03/2007    ONSET DATE: 06/28/2022  REFERRING DIAG: Parkinson's, S/P lumbar surgery  THERAPY DIAG:  Parkinson's disease with dyskinesia, unspecified whether manifestations fluctuate  Difficulty in walking, not elsewhere classified  Muscle weakness (generalized)  Rationale for Evaluation and Treatment Rehabilitation  SUBJECTIVE:                                                                                                                                                                                               SUBJECTIVE STATEMENT:  Patient reports that she saw the MD and thought she was doing well, patient does report car issues and she may not be  able to come to PT as often Pt accompanied by: self  PERTINENT HISTORY: see above, did have lumbar surgery in June, has a deep brain stimulator for movement disorder  PAIN:  Are you having pain? No  PRECAUTIONS: None  WEIGHT BEARING RESTRICTIONS No  FALLS: Has patient fallen in last 6 months? No  LIVING ENVIRONMENT: Lives with: lives with their family Lives in: House/apartment Stairs: Yes: Internal: 12 steps; can reach both Has following equipment at home: Single point cane and Walker - 4 wheeled  PLOF: Independent, does some housework  PATIENT GOALS:  be stronger, walk longer and better  OBJECTIVE:   COGNITION: Overall cognitive status: Within functional limits for tasks assessed   SENSATION: WFL   POSTURE: rounded shoulders, forward head, and decreased lumbar lordosis  LOWER EXTREMITY ROM:    Motions are WFL's but she has poor control of the right LE, weakness and dyskinesia  LOWER EXTREMITY MMT:    MMT Right Eval Left Eval  Hip flexion 3+ 4  Hip extension 4- 4  Hip abduction 4- 4  Hip adduction 4- 4  Hip internal rotation    Hip external rotation    Knee flexion 3+ 4  Knee extension 3+ 4  Ankle dorsiflexion 4- 4-  Ankle plantarflexion    Ankle inversion    Ankle eversion 3+ 4-  (Blank rows = not tested)  TRANSFERS:  Has to use hands, if not falls back into chair  GAIT: Gait pattern: has extraneous motions of the right arm and the right leg and the shoulders and head, off balance at times but typically corrects on own, right LE tends to circumduct and has decreased control of the knee and the ankle Distance walked: 400 feet Assistive device utilized: None Level of assistance: Complete Independence Comments: on stairs does one at a time  FUNCTIONAL TESTs:  5 times sit to stand: 21 seconds Timed up and  go (TUG): 17 seconds BERG balance 39/56  TODAY'S TREATMENT:  09/26/22 Nustep level 5 x 6 minutes Bike level 4 x 6 minutes UBE level 3 x 6 minutes Walking ball toss On airex ball toss Volleyball Direction changes, speed changes  09/21/22 Walk around the building in the back good pace, some extra motions are more pronounced this AM Nustep level 5 x 6 minutes UBE level 3 x 5 minutes 5# straight arm pulls Leg press 20#  2x10 Red tband DF/PF Volleyball On airex cone toe touches Side stepping back wards walk, direction changes Walking ball toss fwd and backward  09/19/22  Nustep L 5 6 min Knee ext 5# 2 sets 10 HC curl 15# 2 sets 10 Black tband trunk flex and ext 2 sets 10 Seated row 20# 2 sets 10 Lat pull 20# 2 sets 10 20# resisted gait 4 x 4 way - MIn A d/t several stumbles STS on airex 10 x 2 sets- decreased initial standing with posterior lean and decreased eccentric lowering Ball toss on airex Side stepping over airex Alt step tap 6 inch 20 x 2 sets   09/14/22 Bike level 3 x 6 minutes Gait around the building 1 lap Feet on ball K2C, trunk rotation, small bridge, isometric abs Leg extension 5# 2x10 Leg curls 15# 2x10 Volley ball Walking ball toss Ball kicks Nustep level 5 x 5 minutes  PATIENT EDUCATION: Education details: POC Person educated: Patient and Child(ren) Education method: Explanation Education comprehension: verbalized understanding   HOME EXERCISE PROGRAM: Advised to walk 10 - 20 minutes a day  GOALS: Goals reviewed with patient? Yes  SHORT TERM GOALS: Target date: 09/18/22  Independent with initial HEP  Goal status:met   LONG TERM GOALS: Target date: 11/27/22  Independent with advanced HEP Goal status: progressing  2.  Decrease TUG time to 12 seconds Goal status: progressing  3.  Increase BERG balance test score to 46/56 Goal status:ongoing  4.  Increase right LE strength to 4-/5 Goal status:progressing  5.  Get up from  sitting without using hands Goal statuspartially met  ASSESSMENT:  CLINICAL IMPRESSION:  Patient continues to improve with her mobility, less dyskinesia today, she reports less back pain as well, just soreness.  She does report that she will probably have to decrease to one time a week due to car issues   OBJECTIVE IMPAIRMENTS Abnormal gait, decreased activity tolerance, decreased balance, decreased coordination, decreased endurance, decreased mobility, difficulty walking, decreased ROM, decreased strength, increased muscle spasms, impaired flexibility, improper body mechanics, postural dysfunction, and pain.   REHAB POTENTIAL: Good  CLINICAL DECISION MAKING: Stable/uncomplicated  EVALUATION COMPLEXITY: Low  PLAN: PT FREQUENCY: 1-2x/week  PT DURATION: 12 weeks  PLANNED INTERVENTIONS: Therapeutic exercises, Therapeutic activity, Neuromuscular re-education, Balance training, Gait training, Patient/Family education, Self Care, Joint mobilization, Stair training, Electrical stimulation, Spinal mobilization, Cryotherapy, Moist heat, and Manual therapy  PLAN FOR NEXT SESSION: decrease to one time a week due car issues   Sumner Boast, PT 09/26/2022, 10:21 AM       [address] [place], [place], [address] Phone: [phone]   Fax:  [phone]

## 2022-09-26 NOTE — Telephone Encounter (Signed)
Refill request  Clonazepam LR  08/16/22 #45 Tramadol LR 08/27/22 #30 Last office visit 04/26/22

## 2022-09-26 NOTE — Telephone Encounter (Signed)
Duplicate request

## 2022-09-27 ENCOUNTER — Other Ambulatory Visit (HOSPITAL_COMMUNITY): Payer: Self-pay

## 2022-09-27 MED ORDER — CLONAZEPAM 0.5 MG PO TABS
ORAL_TABLET | ORAL | 0 refills | Status: DC
Start: 2022-09-27 — End: 2022-10-26

## 2022-09-27 MED ORDER — TRAMADOL HCL 50 MG PO TABS: 50.0000 mg | ORAL_TABLET | Freq: Four times a day (QID) | ORAL | 0 refills | Status: DC | PRN

## 2022-09-27 NOTE — Telephone Encounter (Signed)
ERx to [facility]. plz notify pt.

## 2022-09-27 NOTE — Telephone Encounter (Signed)
Notified pt via [place].  Spoke with Delilah Shan, of [facility], to c/x prescriptions.  Says she will have them c/x.

## 2022-09-28 ENCOUNTER — Ambulatory Visit: Payer: Medicare Other | Admitting: Physical Therapy

## 2022-10-02 DIAGNOSIS — H401131 Primary open-angle glaucoma, bilateral, mild stage: Secondary | ICD-10-CM | POA: Diagnosis not present

## 2022-10-02 DIAGNOSIS — H26492 Other secondary cataract, left eye: Secondary | ICD-10-CM | POA: Diagnosis not present

## 2022-10-02 DIAGNOSIS — I10 Essential (primary) hypertension: Secondary | ICD-10-CM | POA: Diagnosis not present

## 2022-10-02 DIAGNOSIS — Z961 Presence of intraocular lens: Secondary | ICD-10-CM | POA: Diagnosis not present

## 2022-10-03 ENCOUNTER — Ambulatory Visit: Payer: Medicare Other | Attending: Neurology | Admitting: Physical Therapy

## 2022-10-03 ENCOUNTER — Ambulatory Visit: Payer: Medicare Other

## 2022-10-03 DIAGNOSIS — G20B1 Parkinson's disease with dyskinesia, without mention of fluctuations: Secondary | ICD-10-CM | POA: Insufficient documentation

## 2022-10-03 DIAGNOSIS — R471 Dysarthria and anarthria: Secondary | ICD-10-CM

## 2022-10-03 DIAGNOSIS — R1311 Dysphagia, oral phase: Secondary | ICD-10-CM

## 2022-10-03 DIAGNOSIS — M6281 Muscle weakness (generalized): Secondary | ICD-10-CM | POA: Diagnosis not present

## 2022-10-03 DIAGNOSIS — R262 Difficulty in walking, not elsewhere classified: Secondary | ICD-10-CM | POA: Diagnosis not present

## 2022-10-03 NOTE — Therapy (Signed)
OUTPATIENT SPEECH LANGUAGE PATHOLOGY TREATMENT   Patient Name: [name] MRN: [id number] DOB:Apr 12, 1946, 76 y.o., female Today's Date: 10/03/2022  PCP: [name], MD REFERRING PROVIDER: Alonza Bogus, DO   End of Session - 10/03/22 1309     Visit Number 3    Number of Visits 15    Date for SLP Re-Evaluation 12/11/22    SLP Start Time 1152    SLP Stop Time  1232    SLP Time Calculation (min) 40 min    Activity Tolerance Patient tolerated treatment well               Past Medical History:  Diagnosis Date   Allergy    ANEMIA-NOS 09/25/2007   Anxiety    Arthritis    B12 DEFICIENCY 05/03/2007   Blood transfusion without reported diagnosis    CAD (coronary artery disease) 01/2010   MI, Nishan   Cardiomyopathy ([place]) 02/08/2010   H/o this 2012 after urosepsis, no recurrence.    Cataract    CHF (congestive heart failure) (HCC)    with episode of sepsis   Complication of anesthesia    Depression    not recently   FIBROMYALGIA 05/03/2007   Fibromyalgia    GERD 02/22/2010   Glaucoma 02/2013   [facility]   Headache    History of CHF (congestive heart failure) 01/2010   History of colon polyps 2004   HYPERLIPIDEMIA 12/19/2007   HYPOTENSION, ORTHOSTATIC 12/06/2008   Interstitial cystitis    Ottelin now Dr Amalia Hailey   Lupus (systemic lupus erythematosus) ([place]) 02/08/2010   MCTD (mixed connective tissue disease) ([place]) 02/08/2010   OSTEOPOROSIS 08/2009   bisphosphonate on hold 2/2 dysphagia, on reclast done in August each year   Parkinson's disease 08/25/2015   Dx Dr [name] 07/2015    Pneumonia    PONV (postoperative nausea and vomiting)    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   Takotsubo cardiomyopathy 2008   due to E coli urosepsis   Past Surgical History:  Procedure Laterality Date   ABDOMINAL HYSTERECTOMY  1970s   IUD infection - first partial then with oophorectomy (cysts), complication - low blood pressure   CATARACT  EXTRACTION Bilateral    CHOLECYSTECTOMY     complication - low blood pressure   COLONOSCOPY  06/2008   h/o polyps but latest WNL, rec rpt 10 yrs Olevia Perches)   COLONOSCOPY  11/2018   multiple TAs (10 polyps total), rpt 2 yrs Fuller Plan)   COLONOSCOPY  04/2021   multiple TAs, rpt 3 yrs Fuller Plan)   CYSTOSCOPY  12/2013   abx treatment for recurrent cystitis   DEXA  04/2013   T -2.9 @ femur, -1.6 @ spine   DEXA  04/2017   T -2.9 hip, -0.7 spine   ESOPHAGOGASTRODUODENOSCOPY  12/2017   WNL, regardless esophagus dilated, small HH Fuller Plan)   EYE SURGERY     LUMBAR LAMINECTOMY/DECOMPRESSION MICRODISCECTOMY Right 11/27/2019   Right Lumbar Four-Five foraminotomy;  Erline Levine, MD)   LUMBAR LAMINECTOMY/DECOMPRESSION MICRODISCECTOMY Right 05/16/2022   Procedure: OPEN LUMBAR LAMINECTOMY, RT, L45 W/LATERAL RECESS DECOMPRESSION;  Surgeon: Karsten Ro, DO;  Location: [facility];  Service: Neurosurgery;  Laterality: Right;  3C   MINOR PLACEMENT OF FIDUCIAL N/A 06/30/2021   Procedure: MINOR PLACEMENT OF FIDUCIAL;  Surgeon: Erline Levine, MD;  Location: [facility];  Service: Neurosurgery;  Laterality: N/A;  Minor room   PTOSIS REPAIR Bilateral 10/2020   Plastic Surgery   PULSE GENERATOR  IMPLANT Right 07/14/2021   Procedure: UNILATERAL PULSE GENERATOR IMPLANT;  Surgeon: Erline Levine, MD;  Location: [facility];  Service: Neurosurgery;  Laterality: Right;   SUBTHALAMIC STIMULATOR INSERTION Bilateral 07/07/2021   Procedure: Deep brain stimulator placement;  Surgeon: Erline Levine, MD;  Location: [facility];  Service: Neurosurgery;  Laterality: Bilateral;   TUBAL LIGATION     UPPER GASTROINTESTINAL ENDOSCOPY     Patient Active Problem List   Diagnosis Date Noted   Lumbar radiculopathy, chronic 05/16/2022   Pre-op evaluation 04/26/2022   CAP (community acquired pneumonia) 03/29/2022   Localized swelling of chest wall 12/23/2021   Other fatigue 11/25/2021   Sinus pain 11/25/2021   Lymphadenopathy 11/25/2021   Parkinson's  disease with dyskinesia 05/28/2021   Closed fracture of rib of right side with routine healing 05/28/2021   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   Left shoulder pain 06/19/2020   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   Degenerative lumbar spinal stenosis 11/27/2019   Knee pain, bilateral 08/06/2018   Constipation 08/06/2018   Right lumbar radiculopathy 04/10/2018   Dysphagia 10/09/2017   Chronic cough 07/16/2017   GAD (generalized anxiety disorder) 04/28/2016   Chronic insomnia 03/07/2016   Left Achilles tendinitis 12/08/2015   Parkinson's disease 08/25/2015   Advanced care planning/counseling discussion 05/26/2015   Family circumstance 02/23/2015   Encounter for general adult medical examination with abnormal findings 05/21/2014   MDD (major depressive disorder), single episode, moderate ([place]) 11/12/2013   DDD (degenerative disc disease), lumbar 05/31/2013   Medicare annual wellness visit, subsequent 05/20/2013   HLD (hyperlipidemia) 05/20/2013   Vitamin D deficiency 05/09/2013   Recurrent UTI 01/03/2011   Rash and other nonspecific skin eruption 05/11/2010   GERD 02/22/2010   CAD (coronary artery disease) 01/25/2010   Osteoporosis 11/10/2009   Orthostatic hypotension 12/06/2008   Anemia 09/25/2007   Vitamin B12 deficiency 05/03/2007   Fibromyalgia 05/03/2007    ONSET DATE: Script written September 2023  REFERRING DIAG: G20 (ICD-10-CM) - Parkinson's disease   THERAPY DIAG:  Dysarthria and anarthria  Dysphagia, oral phase  Rationale for Evaluation and Treatment Rehabilitation  SUBJECTIVE:   SUBJECTIVE STATEMENT: "I'm going to be done after next week - we need a new car now. I'll start back after the holidays." Pt accompanied by: self  PERTINENT HISTORY: PMHx significant for Parkinsons disease w/ DBS 06/2021, Hx of orthostatic hypotension, anxiety/depression, RUE/RLE reflex  sympathetic dystrophy, osteoporosis, lupus, fibromyalgia, CHF and OA. Lumbar sx June 2023. Pt with evaluation for voice/dysarthria in Oct 2022 but did not return after eval due to medical reason.  PAIN:  Are you having pain? No  LIVING ENVIRONMENT: Lives with: lives with their family (son) Lives in: House/apartment  PLOF:  Level of assistance: Independent with ADLs, Comment: does most housework Employment: Retired  PATIENT GOALS: Talk louder  OBJECTIVE:   DIAGNOSTIC FINDINGS:  Clinical Impression - MBSS   05-10-22             Patient presents with a very mild oropharyngeal dysphagia with primary impairments being delays in anterior to posterior transit of solid texture boluses in oral cavity and mildly delayed pharyngeal peristalsis of puree and mechanical soft solids. She did exhibit mildly delayed swallow initiation of mechanical soft solids at level of vallecular sinus and trace residuals above UES opening. No other pharyngeal residuals observed with any of the tested consistencies (thin liquids, puree solids, mechanical soft solids, 1/2 barium tablet. No  penetration or aspiration observed with liquids or solids. Esophageal sweep did not reveal significant amount of barium stasis and esophageal transit appeared WFL. (this is an improvement as compared to 2018 MBS). Patient would cough after most swallows in absence of any observed penetration or aspiration. She did also have a cough prior to any PO's being given and when asked most recent time she ate, she reported that she has not had anything to eat or drink since last night. SLP reviewed MBS from 2018 which reports "no airway compromise despite frequent coughing immediately post-swallow". Unfortunately, results of this MBS do not explain patient's frequent coughing that occurs immediately after swallow PO's. Swallow Evaluation Recommendations  SLP Diet Recommendations: Regular solids;Thin liquid  Liquid Administration via: Straw;Cup   Medication Administration: Whole meds with liquid  Compensations: Minimize environmental distractions;Slow rate;Small sips/bites   PATIENT REPORTED OUTCOME MEASURES (PROM): Communication Effectiveness Survey: to be completed in first 1-2 sessions   TODAY'S TREATMENT:  10/03/22: SLP had pt start with "hey"s and produced mid-upper 70s dB x2. SLP provided pt a model x3 and she incr'd productions to low-mid 80s dB. She req'd usual mod cues for loudness and usual mod-max cues for breath support prior to each "hey!". Pt has done loud "hey" at least once/day, but has not read out loud for 30 seconds. SLP told pt to perform "hey" 10 reps BID, and read out loud 30 seconds x4 reps. Pt's "everyday sentences" had 6/8 that were really said everyday. SLP hjad pt read these with LOUD voice and averaged upper 70s dB. She req'd usual mod A for loudness faded to occasional min A, and occasional min-mod A for breath support prior to each sentence. In reading paragraphs, Deysy averaged upper 60s-low 70s dB and said she felt like she was talking too loudly. SLP used auditory feedback (recorded her reading) to prove to her that her volume was WNL. 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  10/24/23Estill Bamberg has not done small sips at home.  Pt started with "hey"s and produced mid-upper 70s dB x2. SLP provided pt a model and she incr'd productions to mid 23s dB. In divergent naming tasks pt req'd rare min A for louder speech, and in semi-structured sentence response tasks pt req'd rare min A for loudness and loudness decay. SLP during this time reminded pt that her volume was normally loud (70-72 dB) and that was the loudness she produced prior to Parkinson's (PD).  SLP gave pt homework to generate 10 everyday sentences, to cont with HEY, and the reading (if she can do so).  09/12/22: SLP discussed results of  evaluation with pt, and educated pt re: loud "hey", and her work to do at home - see pt instructions   PATIENT EDUCATION: Education details: see above in "today's treatment" Person educated: Patient Education method: Explanation, Demonstration, Verbal cues, and Handouts Education comprehension: verbalized understanding, returned demonstration, verbal cues required, and needs further education  HOME EXERCISE PROGRAM: 10 loud "hey", read out loud for 30 seconds x4, BID   GOALS: Goals reviewed with patient? No  SHORT TERM GOALS: Target date: 10/10/2022    pt will produce loud /a/ or "hey" with low 80s dB average in 3 sessions  Baseline:09-19-22 Goal status: Ongoing  2.  pt will demo abdominal breathing at rest 75% of the time in 3 sessions  Baseline:  Goal status: Ongoing  3.  Loye will answer 15-20 mod complex "wh" questions with average volume in upper 60s dB in 3 sessions  Baseline:  Goal status: Ongoing  4.  Pt will complete HEP 75% between sessions Baseline:  Goal status: Ongoing   LONG TERM GOALS: Target date: 12/05/2022    pt will demonstrate abdominal breathing in simple Q and A responses 75% of the time in 3 sessions  Baseline:  Goal status: Ongoing  2.  Jackalynn will produce speech volume in 3 minutes simple conversation with average mid-upper 60s dB in 3 sessions  Baseline:  Goal status: Ongoing  3.  Pt will produce speech volume of upper 60s dB in 5 minutes simple conversation with occasional nonverbal cues in 3 sessions  Baseline:  Goal status: Ongoing  4.  Pt will improve PROM measurement when compared to initial session, when given in last week of speech therapy Baseline:  Goal status: Ongoing    5.  SLP to monitor pt's intake of liquids to ensure pulmonary safety    Baseline:    Goal Status: Ongoing  ASSESSMENT:  CLINICAL IMPRESSION: Patient is a 76 y.o. female who was seen today for treatment of dysarthria/voice in light of PD. Pt told SLP today  that she must d/c after next 1-2 visits due to transportation issues. SLP to monitor/possibly target aspiration precautions for pt's liquid intake to ensure pulmonary safety.  OBJECTIVE IMPAIRMENTS: Objective impairments include dysarthria and voice disorder. These impairments are limiting patient from household responsibilities, ADLs/IADLs, and effectively communicating at home and in community.Factors affecting potential to achieve goals and functional outcome are  none .Marland Kitchen Patient will benefit from skilled SLP services to address above impairments and improve overall function.  REHAB POTENTIAL: Good  PLAN:  SLP FREQUENCY: 2x/week  SLP DURATION: 12 weeks  PLANNED INTERVENTIONS: Aspiration precaution training, Pharyngeal strengthening exercises, Diet toleration management , Cueing hierachy, Internal/external aids, Oral motor exercises, Functional tasks, SLP instruction and feedback, Compensatory strategies, and Patient/family education    [facility], [place] 10/03/2022, 1:09 PM

## 2022-10-03 NOTE — Therapy (Signed)
OUTPATIENT PHYSICAL THERAPY NEURO   Patient Name: [name] MRN: [id number] DOB:05/09/1946, 76 y.o., female Today's Date: 10/03/2022   PCP: Leary Roca PROVIDER: [name]   PT End of Session - 10/03/22 1057     Visit Number 7    Number of Visits 12    Date for PT Re-Evaluation 12/05/21    PT Start Time 1055    PT Stop Time 1140    PT Time Calculation (min) 45 min             Past Medical History:  Diagnosis Date   Allergy    ANEMIA-NOS 09/25/2007   Anxiety    Arthritis    B12 DEFICIENCY 05/03/2007   Blood transfusion without reported diagnosis    CAD (coronary artery disease) 01/2010   MI, Nishan   Cardiomyopathy ([place]) 02/08/2010   H/o this 2012 after urosepsis, no recurrence.    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   OSTEOPOROSIS 08/2009   bisphosphonate on hold 2/2 dysphagia, on reclast done in August each year   Parkinson's disease 08/25/2015   Dx Dr [name] 07/2015    Pneumonia    PONV (postoperative nausea and vomiting)    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   Takotsubo cardiomyopathy 2008   due to E coli urosepsis   Past Surgical History:  Procedure Laterality Date   ABDOMINAL HYSTERECTOMY  1970s   IUD infection - first partial then with oophorectomy (cysts), complication - low blood pressure   CATARACT EXTRACTION Bilateral    CHOLECYSTECTOMY     complication - low blood pressure   COLONOSCOPY   06/2008   h/o polyps but latest WNL, rec rpt 10 yrs Olevia Perches)   COLONOSCOPY  11/2018   multiple TAs (10 polyps total), rpt 2 yrs Fuller Plan)   COLONOSCOPY  04/2021   multiple TAs, rpt 3 yrs Fuller Plan)   CYSTOSCOPY  12/2013   abx treatment for recurrent cystitis   DEXA  04/2013   T -2.9 @ femur, -1.6 @ spine   DEXA  04/2017   T -2.9 hip, -0.7 spine   ESOPHAGOGASTRODUODENOSCOPY  12/2017   WNL, regardless esophagus dilated, small HH Fuller Plan)   EYE SURGERY     LUMBAR LAMINECTOMY/DECOMPRESSION MICRODISCECTOMY Right 11/27/2019   Right Lumbar Four-Five foraminotomy;  Erline Levine, MD)   LUMBAR LAMINECTOMY/DECOMPRESSION MICRODISCECTOMY Right 05/16/2022   Procedure: OPEN LUMBAR LAMINECTOMY, RT, L45 W/LATERAL RECESS DECOMPRESSION;  Surgeon: Karsten Ro, DO;  Location: [facility];  Service: Neurosurgery;  Laterality: Right;  3C   MINOR PLACEMENT OF FIDUCIAL N/A 06/30/2021   Procedure: MINOR PLACEMENT OF FIDUCIAL;  Surgeon: Erline Levine, MD;  Location: [facility];  Service: Neurosurgery;  Laterality: N/A;  Minor room   PTOSIS REPAIR Bilateral 10/2020   Plastic Surgery   PULSE GENERATOR IMPLANT Right 07/14/2021   Procedure: UNILATERAL PULSE GENERATOR IMPLANT;  Surgeon: Erline Levine, MD;  Location: [facility];  Service: Neurosurgery;  Laterality: Right;   SUBTHALAMIC STIMULATOR INSERTION Bilateral 07/07/2021   Procedure: Deep brain stimulator placement;  Surgeon: Erline Levine, MD;  Location: [facility];  Service: Neurosurgery;  Laterality: Bilateral;   TUBAL LIGATION     UPPER GASTROINTESTINAL ENDOSCOPY     Patient Active Problem List   Diagnosis Date Noted   Lumbar radiculopathy, chronic 05/16/2022   Pre-op evaluation 04/26/2022   CAP (community acquired pneumonia) 03/29/2022   Localized swelling of chest wall 12/23/2021   Other fatigue 11/25/2021   Sinus pain 11/25/2021   Lymphadenopathy 11/25/2021   Parkinson's disease with dyskinesia 05/28/2021   Closed fracture of rib of right side with routine healing  05/28/2021   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   Left shoulder pain 06/19/2020   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   Degenerative lumbar spinal stenosis 11/27/2019   Knee pain, bilateral 08/06/2018   Constipation 08/06/2018   Right lumbar radiculopathy 04/10/2018   Dysphagia 10/09/2017   Chronic cough 07/16/2017   GAD (generalized anxiety disorder) 04/28/2016   Chronic insomnia 03/07/2016   Left Achilles tendinitis 12/08/2015   Parkinson's disease 08/25/2015   Advanced care planning/counseling discussion 05/26/2015   Family circumstance 02/23/2015   Encounter for general adult medical examination with abnormal findings 05/21/2014   MDD (major depressive disorder), single episode, moderate ([place]) 11/12/2013   DDD (degenerative disc disease), lumbar 05/31/2013   Medicare annual wellness visit, subsequent 05/20/2013   HLD (hyperlipidemia) 05/20/2013   Vitamin D deficiency 05/09/2013   Recurrent UTI 01/03/2011   Rash and other nonspecific skin eruption 05/11/2010   GERD 02/22/2010   CAD (coronary artery disease) 01/25/2010   Osteoporosis 11/10/2009   Orthostatic hypotension 12/06/2008   Anemia 09/25/2007   Vitamin B12 deficiency 05/03/2007   Fibromyalgia 05/03/2007    ONSET DATE: 06/28/2022  REFERRING DIAG: Parkinson's, S/P lumbar surgery  THERAPY DIAG:  Parkinson's disease with dyskinesia, unspecified whether manifestations fluctuate  Difficulty in walking, not elsewhere classified  Muscle weakness (generalized)  Rationale for Evaluation and Treatment Rehabilitation  SUBJECTIVE:                                                                                                                                                                                              SUBJECTIVE STATEMENT:   pt stataes time change has really thrown her off. No back pain just shlds from sleeping on  them   Pt accompanied by: self  PERTINENT HISTORY: see above, did have lumbar surgery in June, has a deep brain stimulator  for movement disorder  PAIN:  Are you having pain? No  PRECAUTIONS: None  WEIGHT BEARING RESTRICTIONS No  FALLS: Has patient fallen in last 6 months? No  LIVING ENVIRONMENT: Lives with: lives with their family Lives in: House/apartment Stairs: Yes: Internal: 12 steps; can reach both Has following equipment at home: Single point cane and Walker - 4 wheeled  PLOF: Independent, does some housework  PATIENT GOALS:  be stronger, walk longer and better  OBJECTIVE:   COGNITION: Overall cognitive status: Within functional limits for tasks assessed   SENSATION: WFL   POSTURE: rounded shoulders, forward head, and decreased lumbar lordosis  LOWER EXTREMITY ROM:    Motions are WFL's but she has poor control of the right LE, weakness and dyskinesia  LOWER EXTREMITY MMT:    MMT Right Eval Left Eval  Hip flexion 3+ 4  Hip extension 4- 4  Hip abduction 4- 4  Hip adduction 4- 4  Hip internal rotation    Hip external rotation    Knee flexion 3+ 4  Knee extension 3+ 4  Ankle dorsiflexion 4- 4-  Ankle plantarflexion    Ankle inversion    Ankle eversion 3+ 4-  (Blank rows = not tested)  TRANSFERS:  Has to use hands, if not falls back into chair  GAIT: Gait pattern: has extraneous motions of the right arm and the right leg and the shoulders and head, off balance at times but typically corrects on own, right LE tends to circumduct and has decreased control of the knee and the ankle Distance walked: 400 feet Assistive device utilized: None Level of assistance: Complete Independence Comments: on stairs does one at a time  FUNCTIONAL TESTs:  5 times sit to stand: 21 seconds Timed up and go (TUG): 17 seconds BERG balance 39/56  TODAY'S TREATMENT:   10/03/22 Nustep L 5 57mn Bike L 4 654m UBE L 3 35m71mResisted gait 4 ways 4 x 30# Black bar 20x  heel raise/toe raise 6 inch step up 10 x each leg from airex 6 inch step up and over- min A with 2 LOB requiring assistance to prevent fall STS on airex with wt ball toss 2 sets 5- good righting reaction but LE weakness noted Tandem gait fwd and back on foam beam in // bars 5 x- occasional UE use Side stepping foam beam // bars 5 x-occasional UE use HS curls 25# 2 sets 10 Knee ext 15# 2 sets 10  09/26/22 Nustep level 5 x 6 minutes Bike level 4 x 6 minutes UBE level 3 x 6 minutes Walking ball toss On airex ball toss Volleyball Direction changes, speed changes  09/21/22 Walk around the building in the back good pace, some extra motions are more pronounced this AM Nustep level 5 x 6 minutes UBE level 3 x 5 minutes 5# straight arm pulls Leg press 20#  2x10 Red tband DF/PF Volleyball On airex cone toe touches Side stepping back wards walk, direction changes Walking ball toss fwd and backward  09/19/22  Nustep L 5 6 min Knee ext 5# 2 sets 10 HC curl 15# 2 sets 10 Black tband trunk flex and ext 2 sets 10 Seated row 20# 2 sets 10 Lat pull 20# 2 sets 10 20# resisted gait 4 x 4 way - MIn A d/t several stumbles STS on airex 10 x 2 sets- decreased initial standing with posterior lean and decreased eccentric lowering Ball toss on airex Side stepping over airex Alt step tap 6 inch 20  x 2 sets   09/14/22 Bike level 3 x 6 minutes Gait around the building 1 lap Feet on ball K2C, trunk rotation, small bridge, isometric abs Leg extension 5# 2x10 Leg curls 15# 2x10 Volley ball Walking ball toss Ball kicks Nustep level 5 x 5 minutes  PATIENT EDUCATION: Education details: POC Person educated: Patient and Child(ren) Education method: Explanation Education comprehension: verbalized understanding   HOME EXERCISE PROGRAM: Advised to walk 10 - 20 minutes a day    GOALS: Goals reviewed with patient? Yes  SHORT TERM GOALS: Target date: 09/18/22  Independent with initial  HEP  Goal status:met   LONG TERM GOALS: Target date: 11/27/22  Independent with advanced HEP Goal status: progressing  2.  Decrease TUG time to 12 seconds Goal status: progressing  3.  Increase BERG balance test score to 46/56 Goal status:ongoing  4.  Increase right LE strength to 4-/5 Goal status:progressing  5.  Get up from sitting without using hands Goal statuspartially met  ASSESSMENT:  CLINICAL IMPRESSION:  Increased dyskinesia today- says time change has her alittle off. Advanced balance ex and needed assistance - see above note. No pain and tolerated session well. Progressing with all goals OBJECTIVE IMPAIRMENTS Abnormal gait, decreased activity tolerance, decreased balance, decreased coordination, decreased endurance, decreased mobility, difficulty walking, decreased ROM, decreased strength, increased muscle spasms, impaired flexibility, improper body mechanics, postural dysfunction, and pain.   REHAB POTENTIAL: Good  CLINICAL DECISION MAKING: Stable/uncomplicated  EVALUATION COMPLEXITY: Low  PLAN: PT FREQUENCY: 1-2x/week  PT DURATION: 12 weeks  PLANNED INTERVENTIONS: Therapeutic exercises, Therapeutic activity, Neuromuscular re-education, Balance training, Gait training, Patient/Family education, Self Care, Joint mobilization, Stair training, Electrical stimulation, Spinal mobilization, Cryotherapy, Moist heat, and Manual therapy  PLAN FOR NEXT SESSION: decrease to one time a week due car issues   Jayen Bromwell,ANGIE, PTA 10/03/2022, 10:58 AM       [address] [place] [place], [place], [address] Phone: [phone]   Fax:  [place] [place], [place], [address] Phone: [phone]   Fax:  [phone]  Patient Details  Name: [name] MRN: [id number] Date of Birth: 10/12/1946 Referring Provider:  [name],  MD  Encounter Date: 10/03/2022   Laqueta Carina, PTA 10/03/2022, 10:58 AM  [address] [place], [place], [address] Phone: [phone]   Fax:  [phone]

## 2022-10-05 ENCOUNTER — Ambulatory Visit: Payer: Medicare Other | Admitting: Physical Therapy

## 2022-10-10 ENCOUNTER — Telehealth: Payer: Self-pay | Admitting: Anesthesiology

## 2022-10-10 ENCOUNTER — Ambulatory Visit: Payer: Medicare Other | Admitting: Physical Therapy

## 2022-10-10 ENCOUNTER — Ambulatory Visit: Payer: Medicare Other

## 2022-10-10 DIAGNOSIS — Z961 Presence of intraocular lens: Secondary | ICD-10-CM | POA: Diagnosis not present

## 2022-10-10 DIAGNOSIS — H524 Presbyopia: Secondary | ICD-10-CM | POA: Diagnosis not present

## 2022-10-10 NOTE — Telephone Encounter (Signed)
Patient called to inform that she has been having headaches at night when laying down. Pain in the area where her surgery was done. She would like for Dr [name] to give her some medication to help with her pain. Patient requests call back.

## 2022-10-11 ENCOUNTER — Ambulatory Visit
Admission: RE | Admit: 2022-10-11 | Discharge: 2022-10-11 | Disposition: A | Discharge status: Home / Self Care | Payer: Medicare Other | Source: Ambulatory Visit | Attending: Family Medicine | Admitting: Family Medicine

## 2022-10-11 DIAGNOSIS — Z780 Asymptomatic menopausal state: Secondary | ICD-10-CM | POA: Diagnosis not present

## 2022-10-11 DIAGNOSIS — Z1231 Encounter for screening mammogram for malignant neoplasm of breast: Secondary | ICD-10-CM | POA: Diagnosis not present

## 2022-10-11 DIAGNOSIS — M810 Age-related osteoporosis without current pathological fracture: Secondary | ICD-10-CM | POA: Diagnosis not present

## 2022-10-12 ENCOUNTER — Ambulatory Visit: Payer: Medicare Other | Admitting: Physical Therapy

## 2022-10-12 MED ORDER — GABAPENTIN 100 MG PO CAPS: ORAL_CAPSULE | ORAL | 1 refills | Status: DC

## 2022-10-12 NOTE — Telephone Encounter (Signed)
Called patient and informed her of Dr. Arturo Morton recommendations of Gabapentin 100 mg at bed. Patient is agreeable and would like to give this a try. I informed patient that I will send in her Rx.

## 2022-10-17 ENCOUNTER — Ambulatory Visit: Payer: Medicare Other | Admitting: Physical Therapy

## 2022-10-17 ENCOUNTER — Ambulatory Visit: Payer: Medicare Other

## 2022-10-17 DIAGNOSIS — G20B1 Parkinson's disease with dyskinesia, without mention of fluctuations: Secondary | ICD-10-CM

## 2022-10-17 DIAGNOSIS — R262 Difficulty in walking, not elsewhere classified: Secondary | ICD-10-CM

## 2022-10-17 DIAGNOSIS — R1311 Dysphagia, oral phase: Secondary | ICD-10-CM

## 2022-10-17 DIAGNOSIS — M6281 Muscle weakness (generalized): Secondary | ICD-10-CM

## 2022-10-17 DIAGNOSIS — R471 Dysarthria and anarthria: Secondary | ICD-10-CM

## 2022-10-17 NOTE — Therapy (Signed)
OUTPATIENT PHYSICAL THERAPY NEURO   Patient Name: [name] MRN: [id number] DOB:1946/10/31, 76 y.o., female Today's Date: 10/17/2022   PCP: Leary Roca PROVIDER: [name]   PT End of Session - 10/17/22 1058     Visit Number 8    Number of Visits 12    Date for PT Re-Evaluation 12/05/21    PT Start Time 1055    PT Stop Time 1140    PT Time Calculation (min) 45 min             Past Medical History:  Diagnosis Date   Allergy    ANEMIA-NOS 09/25/2007   Anxiety    Arthritis    B12 DEFICIENCY 05/03/2007   Blood transfusion without reported diagnosis    CAD (coronary artery disease) 01/2010   MI, Nishan   Cardiomyopathy ([facility] [facility]) 02/08/2010   H/o this 2012 after urosepsis, no recurrence.    Cataract    CHF (congestive heart failure) (HCC)    with episode of sepsis   Complication of anesthesia    Depression    not recently   FIBROMYALGIA 05/03/2007   Fibromyalgia    GERD 02/22/2010   Glaucoma 02/2013    eye center   Headache    History of CHF (congestive heart failure) 01/2010   History of colon polyps 2004   HYPERLIPIDEMIA 12/19/2007   HYPOTENSION, ORTHOSTATIC 12/06/2008   Interstitial cystitis    [place] now Dr Amalia Hailey   Lupus (systemic lupus erythematosus) ([place]) 02/08/2010   MCTD (mixed connective tissue disease) ([place]) 02/08/2010   OSTEOPOROSIS 08/2009   bisphosphonate on hold 2/2 dysphagia, on reclast done in August each year   Parkinson's disease 08/25/2015   Dx Dr [name] 07/2015    Pneumonia    PONV (postoperative nausea and vomiting)    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   Takotsubo cardiomyopathy 2008   due to E coli urosepsis   Past Surgical History:  Procedure Laterality Date   ABDOMINAL HYSTERECTOMY  1970s   IUD infection - first partial then with oophorectomy (cysts), complication - low blood pressure   CATARACT EXTRACTION Bilateral    CHOLECYSTECTOMY     complication - low blood pressure   COLONOSCOPY   06/2008   h/o polyps but latest WNL, rec rpt 10 yrs Olevia Perches)   COLONOSCOPY  11/2018   multiple TAs (10 polyps total), rpt 2 yrs Fuller Plan)   COLONOSCOPY  04/2021   multiple TAs, rpt 3 yrs Fuller Plan)   CYSTOSCOPY  12/2013   abx treatment for recurrent cystitis   DEXA  04/2013   T -2.9 @ femur, -1.6 @ spine   DEXA  04/2017   T -2.9 hip, -0.7 spine   ESOPHAGOGASTRODUODENOSCOPY  12/2017   WNL, regardless esophagus dilated, small HH Fuller Plan)   EYE SURGERY     LUMBAR LAMINECTOMY/DECOMPRESSION MICRODISCECTOMY Right 11/27/2019   Right Lumbar Four-Five foraminotomy;  Erline Levine, MD)   LUMBAR LAMINECTOMY/DECOMPRESSION MICRODISCECTOMY Right 05/16/2022   Procedure: OPEN LUMBAR LAMINECTOMY, RT, L45 W/LATERAL RECESS DECOMPRESSION;  Surgeon: Karsten Ro, DO;  Location: [facility];  Service: Neurosurgery;  Laterality: Right;  3C   MINOR PLACEMENT OF FIDUCIAL N/A 06/30/2021   Procedure: MINOR PLACEMENT OF FIDUCIAL;  Surgeon: Erline Levine, MD;  Location: [facility];  Service: Neurosurgery;  Laterality: N/A;  Minor room   PTOSIS REPAIR Bilateral 10/2020   Plastic Surgery   PULSE GENERATOR IMPLANT Right 07/14/2021   Procedure: UNILATERAL PULSE GENERATOR IMPLANT;  Surgeon: Erline Levine, MD;  Location: [facility];  Service: Neurosurgery;  Laterality: Right;   SUBTHALAMIC STIMULATOR INSERTION Bilateral 07/07/2021   Procedure: Deep brain stimulator placement;  Surgeon: Erline Levine, MD;  Location: [facility];  Service: Neurosurgery;  Laterality: Bilateral;   TUBAL LIGATION     UPPER GASTROINTESTINAL ENDOSCOPY     Patient Active Problem List   Diagnosis Date Noted   Lumbar radiculopathy, chronic 05/16/2022   Pre-op evaluation 04/26/2022   CAP (community acquired pneumonia) 03/29/2022   Localized swelling of chest wall 12/23/2021   Other fatigue 11/25/2021   Sinus pain 11/25/2021   Lymphadenopathy 11/25/2021   Parkinson's disease with dyskinesia 05/28/2021   Closed fracture of rib of right side with routine healing  05/28/2021   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   Left shoulder pain 06/19/2020   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   Degenerative lumbar spinal stenosis 11/27/2019   Knee pain, bilateral 08/06/2018   Constipation 08/06/2018   Right lumbar radiculopathy 04/10/2018   Dysphagia 10/09/2017   Chronic cough 07/16/2017   GAD (generalized anxiety disorder) 04/28/2016   Chronic insomnia 03/07/2016   Left Achilles tendinitis 12/08/2015   Parkinson's disease 08/25/2015   Advanced care planning/counseling discussion 05/26/2015   Family circumstance 02/23/2015   Encounter for general adult medical examination with abnormal findings 05/21/2014   MDD (major depressive disorder), single episode, moderate ([place]) 11/12/2013   DDD (degenerative disc disease), lumbar 05/31/2013   Medicare annual wellness visit, subsequent 05/20/2013   HLD (hyperlipidemia) 05/20/2013   Vitamin D deficiency 05/09/2013   Recurrent UTI 01/03/2011   Rash and other nonspecific skin eruption 05/11/2010   GERD 02/22/2010   CAD (coronary artery disease) 01/25/2010   Osteoporosis 11/10/2009   Orthostatic hypotension 12/06/2008   Anemia 09/25/2007   Vitamin B12 deficiency 05/03/2007   Fibromyalgia 05/03/2007    ONSET DATE: 06/28/2022  REFERRING DIAG: Parkinson's, S/P lumbar surgery  THERAPY DIAG:  Parkinson's disease with dyskinesia, unspecified whether manifestations fluctuate  Difficulty in walking, not elsewhere classified  Muscle weakness (generalized)  Rationale for Evaluation and Treatment Rehabilitation  SUBJECTIVE:                                                                                                                                                                                              SUBJECTIVE STATEMENT:   pt arrives stating she had to cancel last week d/t bad medicine reaction, very poor  balance and wobbly- son had to walk with her and prevent falls. Better since off meds. Shld sore, maybe weather Pt accompanied by: self  PERTINENT HISTORY: see above, did have lumbar surgery in June, has a deep brain stimulator for movement disorder  PAIN:  Are you having pain? No  PRECAUTIONS: None  WEIGHT BEARING RESTRICTIONS No  FALLS: Has patient fallen in last 6 months? No  LIVING ENVIRONMENT: Lives with: lives with their family Lives in: House/apartment Stairs: Yes: Internal: 12 steps; can reach both Has following equipment at home: Single point cane and Walker - 4 wheeled  PLOF: Independent, does some housework  PATIENT GOALS:  be stronger, walk longer and better  OBJECTIVE:   COGNITION: Overall cognitive status: Within functional limits for tasks assessed   SENSATION: WFL   POSTURE: rounded shoulders, forward head, and decreased lumbar lordosis  LOWER EXTREMITY ROM:    Motions are WFL's but she has poor control of the right LE, weakness and dyskinesia  LOWER EXTREMITY MMT:    MMT Right Eval Left Eval  Hip flexion 3+ 4  Hip extension 4- 4  Hip abduction 4- 4  Hip adduction 4- 4  Hip internal rotation    Hip external rotation    Knee flexion 3+ 4  Knee extension 3+ 4  Ankle dorsiflexion 4- 4-  Ankle plantarflexion    Ankle inversion    Ankle eversion 3+ 4-  (Blank rows = not tested)  TRANSFERS:  Has to use hands, if not falls back into chair  GAIT: Gait pattern: has extraneous motions of the right arm and the right leg and the shoulders and head, off balance at times but typically corrects on own, right LE tends to circumduct and has decreased control of the knee and the ankle Distance walked: 400 feet Assistive device utilized: None Level of assistance: Complete Independence Comments: on stairs does one at a time  FUNCTIONAL TESTs:  5 times sit to stand: 21 seconds  10/17/22 13.38 sec but decreased eccentric conrtrol Timed up and go (TUG): 17  seconds  10/17/22 9.2 sec without AD BERG balance 39/56  10/17/22 45/56  TODAY'S TREATMENT:   10/17/22 Nustep L 5 6 min Func testing performed 5 times sit to stand: 21 seconds  10/17/22 13.38 sec but decreased eccentric conrtrol Timed up and go (TUG): 17 seconds  10/17/22 9.2 sec without AD BERG balance 39/56  10/17/22 45/56 UBE L 3 2 min fwd/2 min back Resisted gait 4 ways 4 x 30# CG-min A HS curls 25# 2 sets 10 Knee ext 15# 2 sets 10 Side stepping in and out of 6 inch box on foam pads 6 inch alt box taps on foam mat 20 x 2 set s-min A    10/03/22 Nustep L 5 5mn Bike L 4 630m UBE L 3 [id number] gait 4 ways 4 x 30# Black bar 20x heel raise/toe raise 6 inch step up 10 x each leg from airex 6 inch step up and over- min A with 2 LOB requiring assistance to prevent fall STS on airex with wt ball toss 2 sets 5- good righting reaction but LE weakness noted Tandem gait fwd and back on foam beam in // bars 5 x- occasional UE use Side stepping foam beam // bars 5 x-occasional UE use HS curls 25# 2 sets 10 Knee ext 15# 2 sets 10  09/26/22 Nustep level 5 x 6 minutes Bike level 4 x 6 minutes UBE level 3 x 6 minutes Walking ball toss On airex ball toss Volleyball Direction changes, speed changes  09/21/22 Walk around the building in the back good pace, some extra motions  are more pronounced this AM Nustep level 5 x 6 minutes UBE level 3 x 5 minutes 5# straight arm pulls Leg press 20#  2x10 Red tband DF/PF Volleyball On airex cone toe touches Side stepping back wards walk, direction changes Walking ball toss fwd and backward  09/19/22  Nustep L 5 6 min Knee ext 5# 2 sets 10 HC curl 15# 2 sets 10 Black tband trunk flex and ext 2 sets 10 Seated row 20# 2 sets 10 Lat pull 20# 2 sets 10 20# resisted gait 4 x 4 way - MIn A d/t several stumbles STS on airex 10 x 2 sets- decreased initial standing with posterior lean and decreased eccentric lowering Ball toss on  airex Side stepping over airex Alt step tap 6 inch 20 x 2 sets   09/14/22 Bike level 3 x 6 minutes Gait around the building 1 lap Feet on ball K2C, trunk rotation, small bridge, isometric abs Leg extension 5# 2x10 Leg curls 15# 2x10 Volley ball Walking ball toss Ball kicks Nustep level 5 x 5 minutes  PATIENT EDUCATION: Education details: POC Person educated: Patient and Child(ren) Education method: Explanation Education comprehension: verbalized understanding   HOME EXERCISE PROGRAM: Advised to walk 10 - 20 minutes a day    GOALS: Goals reviewed with patient? Yes  SHORT TERM GOALS: Target date: 09/18/22  Independent with initial HEP  Goal status:met   LONG TERM GOALS: Target date: 11/27/22  Independent with advanced HEP Goal status: progressing  10/17/22  2.  Decrease TUG time to 12 seconds Goal status: progressing MET11/21/23  3.  Increase BERG balance test score to 46/56 Goal status:ongoing 10/17/22 45  4.  Increase right LE strength to 4-/5 Goal status:progressing  5.  Get up from sitting without using hands Goal statuspartially met 10/17/22 inconsistent with surface  ASSESSMENT:  CLINICAL IMPRESSION:  pt arrived after canceling last week d/t reaction to meds that compromised balance. Worked on strength and balance and retested func test and addressed goals ( see in body of note)   OBJECTIVE IMPAIRMENTS Abnormal gait, decreased activity tolerance, decreased balance, decreased coordination, decreased endurance, decreased mobility, difficulty walking, decreased ROM, decreased strength, increased muscle spasms, impaired flexibility, improper body mechanics, postural dysfunction, and pain.   REHAB POTENTIAL: Good  CLINICAL DECISION MAKING: Stable/uncomplicated  EVALUATION COMPLEXITY: Low  PLAN: PT FREQUENCY: 1-2x/week  PT DURATION: 12 weeks  PLANNED INTERVENTIONS: Therapeutic exercises, Therapeutic activity, Neuromuscular re-education, Balance  training, Gait training, Patient/Family education, Self Care, Joint mobilization, Stair training, Electrical stimulation, Spinal mobilization, Cryotherapy, Moist heat, and Manual therapy  PLAN FOR NEXT SESSION: decrease to one time a week due car issues   Ariyan Sinnett,ANGIE, PTA 10/17/2022, 11:00 AM       [address] [place], [place], [address] Phone: [phone]   Fax:  [address] [place], [place], [address] Phone: [phone]   Fax:  [phone]  Patient Details  Name: ROLLANDE THURSBY MRN: [id number] Date of Birth: May 03, 1946 Referring Provider:  [name], MD  Encounter Date: 10/17/2022   Laqueta Carina, PTA 10/17/2022, 11:00 AM  [address] [place], [place], [address] Phone: [phone]   Fax:  [place] [place], [place], [address] Phone: [phone]   Fax:  [phone]  Patient Details  Name: ANJELICA GORNIAK MRN: [id number] Date of Birth: 09/05/1946 Referring  Provider:  [name], MD  Encounter Date: 10/17/2022   Laqueta Carina, PTA 10/17/2022, 10:59 AM  [facility] [place], [place], [address] Phone: [phone]   Fax:  [phone]

## 2022-10-17 NOTE — Therapy (Signed)
OUTPATIENT SPEECH LANGUAGE PATHOLOGY TREATMENT   Patient Name: [name] MRN: [id number] DOB:05/06/1946, 76 y.o., female Today's Date: 10/17/2022  PCP: [name], MD REFERRING PROVIDER: Alonza Bogus, DO   End of Session - 10/17/22 1222     Visit Number 4    Number of Visits 15    Date for SLP Re-Evaluation 12/11/22    SLP Start Time 32    SLP Stop Time  1228    SLP Time Calculation (min) 41 min    Activity Tolerance Patient tolerated treatment well                Past Medical History:  Diagnosis Date   Allergy    ANEMIA-NOS 09/25/2007   Anxiety    Arthritis    B12 DEFICIENCY 05/03/2007   Blood transfusion without reported diagnosis    CAD (coronary artery disease) 01/2010   MI, Nishan   Cardiomyopathy ([place]) 02/08/2010   H/o this 2012 after urosepsis, no recurrence.    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   OSTEOPOROSIS 08/2009   bisphosphonate on hold 2/2 dysphagia, on reclast done in August each year   Parkinson's disease 08/25/2015   Dx Dr [name] 07/2015    Pneumonia    PONV (postoperative nausea and vomiting)    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   Takotsubo cardiomyopathy 2008   due to E coli urosepsis   Past Surgical History:  Procedure Laterality Date   ABDOMINAL HYSTERECTOMY  1970s   IUD infection - first partial then with oophorectomy (cysts), complication - low blood pressure   CATARACT  EXTRACTION Bilateral    CHOLECYSTECTOMY     complication - low blood pressure   COLONOSCOPY  06/2008   h/o polyps but latest WNL, rec rpt 10 yrs Olevia Perches)   COLONOSCOPY  11/2018   multiple TAs (10 polyps total), rpt 2 yrs Fuller Plan)   COLONOSCOPY  04/2021   multiple TAs, rpt 3 yrs Fuller Plan)   CYSTOSCOPY  12/2013   abx treatment for recurrent cystitis   DEXA  04/2013   T -2.9 @ femur, -1.6 @ spine   DEXA  04/2017   T -2.9 hip, -0.7 spine   ESOPHAGOGASTRODUODENOSCOPY  12/2017   WNL, regardless esophagus dilated, small HH Fuller Plan)   EYE SURGERY     LUMBAR LAMINECTOMY/DECOMPRESSION MICRODISCECTOMY Right 11/27/2019   Right Lumbar Four-Five foraminotomy;  Erline Levine, MD)   LUMBAR LAMINECTOMY/DECOMPRESSION MICRODISCECTOMY Right 05/16/2022   Procedure: OPEN LUMBAR LAMINECTOMY, RT, L45 W/LATERAL RECESS DECOMPRESSION;  Surgeon: Karsten Ro, DO;  Location: [facility];  Service: Neurosurgery;  Laterality: Right;  3C   MINOR PLACEMENT OF FIDUCIAL N/A 06/30/2021   Procedure: MINOR PLACEMENT OF FIDUCIAL;  Surgeon: Erline Levine, MD;  Location: [facility];  Service: Neurosurgery;  Laterality: N/A;  Minor room   PTOSIS REPAIR Bilateral 10/2020   Plastic Surgery   PULSE  GENERATOR IMPLANT Right 07/14/2021   Procedure: UNILATERAL PULSE GENERATOR IMPLANT;  Surgeon: Erline Levine, MD;  Location: [facility];  Service: Neurosurgery;  Laterality: Right;   SUBTHALAMIC STIMULATOR INSERTION Bilateral 07/07/2021   Procedure: Deep brain stimulator placement;  Surgeon: Erline Levine, MD;  Location: [facility];  Service: Neurosurgery;  Laterality: Bilateral;   TUBAL LIGATION     UPPER GASTROINTESTINAL ENDOSCOPY     Patient Active Problem List   Diagnosis Date Noted   Lumbar radiculopathy, chronic 05/16/2022   Pre-op evaluation 04/26/2022   CAP (community acquired pneumonia) 03/29/2022   Localized swelling of chest wall 12/23/2021   Other fatigue 11/25/2021   Sinus pain 11/25/2021   Lymphadenopathy 11/25/2021   Parkinson's  disease with dyskinesia 05/28/2021   Closed fracture of rib of right side with routine healing 05/28/2021   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   Left shoulder pain 06/19/2020   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   Degenerative lumbar spinal stenosis 11/27/2019   Knee pain, bilateral 08/06/2018   Constipation 08/06/2018   Right lumbar radiculopathy 04/10/2018   Dysphagia 10/09/2017   Chronic cough 07/16/2017   GAD (generalized anxiety disorder) 04/28/2016   Chronic insomnia 03/07/2016   Left Achilles tendinitis 12/08/2015   Parkinson's disease 08/25/2015   Advanced care planning/counseling discussion 05/26/2015   Family circumstance 02/23/2015   Encounter for general adult medical examination with abnormal findings 05/21/2014   MDD (major depressive disorder), single episode, moderate ([place]) 11/12/2013   DDD (degenerative disc disease), lumbar 05/31/2013   Medicare annual wellness visit, subsequent 05/20/2013   HLD (hyperlipidemia) 05/20/2013   Vitamin D deficiency 05/09/2013   Recurrent UTI 01/03/2011   Rash and other nonspecific skin eruption 05/11/2010   GERD 02/22/2010   CAD (coronary artery disease) 01/25/2010   Osteoporosis 11/10/2009   Orthostatic hypotension 12/06/2008   Anemia 09/25/2007   Vitamin B12 deficiency 05/03/2007   Fibromyalgia 05/03/2007    ONSET DATE: Script written September 2023  REFERRING DIAG: G20 (ICD-10-CM) - Parkinson's disease   THERAPY DIAG:  Dysarthria and anarthria  Dysphagia, oral phase  Rationale for Evaluation and Treatment Rehabilitation  SUBJECTIVE:   SUBJECTIVE STATEMENT: "II had to cancel last week because I was taking some medicine and it made me drunk. I'm not taking it anymore" Pt accompanied by: self  PERTINENT HISTORY: PMHx significant for Parkinsons disease w/ DBS 06/2021, Hx of orthostatic hypotension, anxiety/depression,  RUE/RLE reflex sympathetic dystrophy, osteoporosis, lupus, fibromyalgia, CHF and OA. Lumbar sx June 2023. Pt with evaluation for voice/dysarthria in Oct 2022 but did not return after eval due to medical reason.  PAIN:  Are you having pain? No  LIVING ENVIRONMENT: Lives with: lives with their family (son) Lives in: House/apartment  PLOF:  Level of assistance: Independent with ADLs, Comment: does most housework Employment: Retired  PATIENT GOALS: Talk louder  OBJECTIVE:   DIAGNOSTIC FINDINGS:  Clinical Impression - MBSS   05-10-22             Patient presents with a very mild oropharyngeal dysphagia with primary impairments being delays in anterior to posterior transit of solid texture boluses in oral cavity and mildly delayed pharyngeal peristalsis of puree and mechanical soft solids. She did exhibit mildly delayed swallow initiation of mechanical soft solids at level of vallecular sinus and trace residuals above UES opening. No other pharyngeal residuals observed with any of the tested consistencies (thin liquids, puree solids, mechanical soft solids, 1/2 barium  tablet. No penetration or aspiration observed with liquids or solids. Esophageal sweep did not reveal significant amount of barium stasis and esophageal transit appeared WFL. (this is an improvement as compared to 2018 MBS). Patient would cough after most swallows in absence of any observed penetration or aspiration. She did also have a cough prior to any PO's being given and when asked most recent time she ate, she reported that she has not had anything to eat or drink since last night. SLP reviewed MBS from 2018 which reports "no airway compromise despite frequent coughing immediately post-swallow". Unfortunately, results of this MBS do not explain patient's frequent coughing that occurs immediately after swallow PO's. Swallow Evaluation Recommendations  SLP Diet Recommendations: Regular solids;Thin liquid  Liquid Administration via:  Straw;Cup  Medication Administration: Whole meds with liquid  Compensations: Minimize environmental distractions;Slow rate;Small sips/bites   PATIENT REPORTED OUTCOME MEASURES (PROM): Communication Effectiveness Survey: to be completed in first 1-2 sessions   TODAY'S TREATMENT:  10/17/22: Marchell has done loud "hey"s at home 5/14 days since last session. SLP told pt that it was imperative she complete loud "hey" x10, BID. Pt going on hold today until January 2024 due to moving into new house and the holidays.  She forgot her 10 everyday sentences today again - SLP told pt to bring them next session and then we can copy them and leave them here. Loud "hey" today at low-mid 80s with initial cue for loudness. In conversation pt's average was mid-upper 60s dB, but pt had rare rushes of speech which decr'd overall intelligibility. In paragraph reading, pt averaged low 70s dB, but again demonstrated rushes of speech. In addition SLP encouraged pt to overarticulate when reading to minimize impact of rushes of speech. Pt intelligibility improved when she did both of these, to 95-100%.   10/03/22: SLP had pt start with "hey"s and produced mid-upper 70s dB x2. SLP provided pt a model x3 and she incr'd productions to low-mid 80s dB. She req'd usual mod cues for loudness and usual mod-max cues for breath support prior to each "hey!". Pt has done loud "hey" at least once/day, but has not read out loud for 30 seconds. SLP told pt to perform "hey" 10 reps BID, and read out loud 30 seconds x4 reps. Pt's "everyday sentences" had 6/8 that were really said everyday. SLP hjad pt read these with LOUD voice and averaged upper 70s dB. She req'd usual mod A for loudness faded to occasional min A, and occasional min-mod A for breath support prior to each sentence. In reading paragraphs, Tishana averaged upper 60s-low 70s dB and said she felt like she was talking too loudly. SLP used auditory feedback (recorded her reading) to  prove to her that her volume was WNL. 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  10/24/23Estill Bamberg has not done small sips at home.  Pt started with "hey"s and produced mid-upper 70s dB x2. SLP provided pt a model and she incr'd productions to mid 44s dB. In divergent naming tasks pt req'd rare min A for louder speech, and in semi-structured sentence response tasks pt req'd rare min A for loudness and loudness decay. SLP during this time reminded pt that her volume was normally loud (70-72 dB) and that was the loudness she produced prior to Parkinson's (PD).  SLP gave pt homework to generate 10 everyday sentences, to cont with HEY,  and the reading (if she can do so).  09/12/22: SLP discussed results of evaluation with pt, and educated pt re: loud "hey", and her work to do at home - see pt instructions   PATIENT EDUCATION: Education details: see above in "today's treatment" Person educated: Patient Education method: Explanation, Demonstration, Verbal cues, and Handouts Education comprehension: verbalized understanding, returned demonstration, verbal cues required, and needs further education  HOME EXERCISE PROGRAM: 10 loud "hey", read out loud for 30 seconds x4, BID   GOALS: Goals reviewed with patient? No  SHORT TERM GOALS: Target date: 10/24/2022    pt will produce loud /a/ or "hey" with low 80s dB average in 3 sessions  Baseline:09-19-22, 10-17-22 Goal status: ongoing  2.  pt will demo abdominal breathing at rest 75% of the time in 3 sessions  Baseline:  Goal status: Deferred - worked on volume only  3.  Raygen will answer 15-20 mod complex "wh" questions with average volume in upper 60s dB in 3 sessions  Baseline:  Goal status: ongoing  4.  Pt will complete HEP 75% between sessions Baseline:  Goal status: ongoing   LONG TERM GOALS: Target date: 12/05/2022    pt will  demonstrate abdominal breathing in simple Q and A responses 75% of the time in 3 sessions  Baseline:  Goal status: Ongoing  2.  Felicite will produce speech volume in 3 minutes simple conversation with average mid-upper 60s dB in 3 sessions  Baseline:  Goal status: Ongoing  3.  Pt will produce speech volume of upper 60s dB in 5 minutes simple conversation with occasional nonverbal cues in 3 sessions  Baseline:  Goal status: Ongoing  4.  Pt will improve PROM measurement when compared to initial session, when given in last week of speech therapy Baseline:  Goal status: Ongoing    5.  SLP to monitor pt's intake of liquids to ensure pulmonary safety    Baseline:    Goal Status: Ongoing  ASSESSMENT:  CLINICAL IMPRESSION: Pt told SLP today that she must be put on hold until January 2024 due to moving into a new home and the holidays. Pt's STG target date was also changed due to pt's visit count. Patient is a 76 y.o. female who was seen today for treatment of dysarthria/voice in light of PD. SLP to monitor/possibly target aspiration precautions for pt's liquid intake to ensure pulmonary safety.  OBJECTIVE IMPAIRMENTS: Objective impairments include dysarthria and voice disorder. These impairments are limiting patient from household responsibilities, ADLs/IADLs, and effectively communicating at home and in community.Factors affecting potential to achieve goals and functional outcome are  none .Marland Kitchen Patient will benefit from skilled SLP services to address above impairments and improve overall function.  REHAB POTENTIAL: Good  PLAN:  SLP FREQUENCY: 2x/week  SLP DURATION: 12 weeks  PLANNED INTERVENTIONS: Aspiration precaution training, Pharyngeal strengthening exercises, Diet toleration management , Cueing hierachy, Internal/external aids, Oral motor exercises, Functional tasks, SLP instruction and feedback, Compensatory strategies, and Patient/family education    [facility],  [place] 10/17/2022, 3:26 PM

## 2022-10-26 ENCOUNTER — Other Ambulatory Visit: Payer: Self-pay | Admitting: Family Medicine

## 2022-10-27 ENCOUNTER — Telehealth: Payer: Medicare Other

## 2022-10-27 ENCOUNTER — Telehealth: Payer: Self-pay | Admitting: Family Medicine

## 2022-10-27 ENCOUNTER — Other Ambulatory Visit: Payer: Self-pay | Admitting: Family Medicine

## 2022-10-27 ENCOUNTER — Telehealth: Payer: Self-pay | Admitting: Pharmacist

## 2022-10-27 NOTE — Progress Notes (Deleted)
Chronic Care Management Pharmacy Note  10/27/2022 Name:  [name] MRN:  [id number] DOB:  June 25, 1946  Summary: CCM F/U visit -Pt reports Miralax, docusate, fiber supplements do not work/hurt her stomach, she requests an Rx to help with chronic constipation -Pt is not taking carbidopa-levodopa anymore (option for 1/2 tab PRN, has not needed lately); she does report persistent fatigue even after reducing AM clonazepam dose -Pt is not checking BP at home, but has not had any low BP "spells" since taking fludrocortisone  Recommendations/Changes made from today's visit: -Recommend Linzess 145 mcg daily (pt requests Rx sent to [facility]) -Advised pt to check BP a few times a week  Plan: -[place] will call patient *** -Pharmacist follow up televisit scheduled for *** -Neurology appt 03/27/23 -Annual visit due 10/2023    Subjective: [name] is an 76 y.o. year old female who is a primary patient of [name], MD.  The CCM team was consulted for assistance with disease management and care coordination needs.    Engaged with patient by telephone for follow up visit in response to provider referral for pharmacy case management and/or care coordination services.   Consent to Services:  The patient was given information about Chronic Care Management services, agreed to services, and gave verbal consent prior to initiation of services.  Please see initial visit note for detailed documentation.   Patient Care Team: [name], MD as PCP - General (Family Medicine) Domingo Pulse, MD (Urology) [name], Eustace Quail, DO as Consulting Physician (Neurology) Karren Burly Deirdre Peer, MD as Referring Physician (Ophthalmology) [name], Eustace Quail, DO as Consulting Physician (Neurology) Charlton Haws, [facility] as Pharmacist (Pharmacist) [name], Eustace Quail, DO as Consulting Physician (Neurology)  Recent office visits: 04/26/22 Dr Danise Mina OV: pre-op eval (planned back  surgery). Hx CAD - more likely cardiomyopathy than MI, likely no ischemic disease.   04/12/22 Dr Danise Mina OV: CAP, resolved. 03/29/22 NP Tabitha Dugal OV: CAP - Rx Augmentin.  12/23/21 Dr Danise Mina OV: localized swelling of chest wall - rec supportive care. Schedule mammogram and DEXA.   11/25/21 Dr Danise Mina OV: fatigue, swollen glands. Viral URI, rec OTC antihistamine.  11/02/21 Dr Danise Mina OV: annual - Rx Amitiza (Linzess unaffordable) Refill Myrbetriq. LDL 130, advised diet changes to lower LDL.   Recent consult visits: 09/25/22 Dr [name] (Neurology): Parkinsons. S/p DBS 07/07/21. Sinemet 1 tab BID or 1 AM, 1/2 midday, 1/2 PM.   08/11/22 Dr [name] (Neurology): pt taking sinimet PRN. Advised to go back to BID dosing. Referred to PT.  05/01/22 Dr [name] (Neurology): ordered barium swallow for dysphagia.   Hospital visits: 05/16/22 - 05/17/22 [facility]): Admission for back surgery.   Objective:  Lab Results  Component Value Date   CREATININE 0.70 09/20/2022   BUN 15 09/20/2022   GFR 85.31 09/20/2022   GFRNONAA >60 07/22/2021   GFRAA >60 11/24/2019   NA 140 09/20/2022   K 3.6 09/20/2022   CALCIUM 9.4 09/20/2022   CO2 29 09/20/2022   GLUCOSE 118 (H) 09/20/2022    Lab Results  Component Value Date/Time   HGBA1C 5.9 04/26/2022 11:59 AM   HGBA1C 5.6 05/13/2013 08:57 AM   GFR 85.31 09/20/2022 02:02 PM   GFR 82.71 04/26/2022 11:59 AM    Last diabetic Eye exam: No results found for: "HMDIABEYEEXA"  Last diabetic Foot exam: No results found for: "HMDIABFOOTEX"   Lab Results  Component Value Date   CHOL 219 (H) 11/02/2021   HDL 68.90 11/02/2021   [place]  130 (H) 11/02/2021   LDLDIRECT 105.5 07/20/2010   TRIG 96.0 11/02/2021   CHOLHDL 3 11/02/2021       Latest Ref Rng & Units 11/02/2021   10:58 AM 07/22/2021    4:31 AM 07/21/2021   10:16 PM  Hepatic Function  Total Protein 6.0 - 8.3 g/dL 7.3  7.0  6.5   Albumin 3.5 - 5.2 g/dL 4.2  3.8  3.9   AST 0 - 37 U/L _0 ALT 0 - 35 U/L 5   6  <5   Alk Phosphatase 39 - 117 U/L 64  70  65   Total Bilirubin 0.2 - 1.2 mg/dL 0.3  0.6  1.4     Lab Results  Component Value Date/Time   TSH 0.49 10/04/2018 08:56 AM   TSH 1.30 11/09/2016 12:12 PM       Latest Ref Rng & Units 04/26/2022   11:59 AM 11/25/2021    9:54 AM 11/02/2021   10:58 AM  CBC  WBC 4.0 - 10.5 K/uL 5.8  5.1  5.7   Hemoglobin 12.0 - 15.0 g/dL 12.1  12.1  12.0   Hematocrit 36.0 - 46.0 % 37.5  37.8  36.9   Platelets 150.0 - 400.0 K/uL 273.0  263.0  252.0     Lab Results  Component Value Date/Time   VD25OH 37.89 11/02/2021 10:58 AM   VD25OH 32.80 10/25/2020 10:30 AM    Clinical ASCVD: Yes  The 10-year ASCVD risk score (Arnett DK, et al., 2019) is: 14.4%   Values used to calculate the score:     Age: 76 years     Sex: Female     Is Non-Hispanic African American: No     Diabetic: No     Tobacco smoker: No     Systolic Blood Pressure: 235 mmHg     Is BP treated: No     HDL Cholesterol: 68.9 mg/dL     Total Cholesterol: 219 mg/dL       10/28/2021    1:27 PM 10/27/2020   10:31 AM 10/13/2019    9:32 AM  Depression screen PHQ 2/9  Decreased Interest 0 0 0  Down, Depressed, Hopeless 0 0 0  PHQ - 2 Score 0 0 0  Altered sleeping  2 0  Tired, decreased energy  1 0  Change in appetite  2 0  Feeling bad or failure about yourself   1 0  Trouble concentrating  1 0  Moving slowly or fidgety/restless  2 0  Suicidal thoughts  0 0  PHQ-9 Score  9 0  Difficult doing work/chores   Not difficult at all       10/27/2020   10:32 AM  GAD 7 : Generalized Anxiety Score  Nervous, Anxious, on Edge 1  Control/stop worrying 1  Worry too much - different things 1  Trouble relaxing 1  Restless 1  Easily annoyed or irritable 1  Afraid - awful might happen 0  Total GAD 7 Score 6     Social History   Tobacco Use  Smoking Status Never  Smokeless Tobacco Never   BP Readings from Last 3 Encounters:  09/25/22 127/78  08/11/22 122/70  05/17/22 109/67    Pulse Readings from Last 3 Encounters:  09/25/22 78  08/11/22 79  05/17/22 78   Wt Readings from Last 3 Encounters:  09/25/22 156 lb 6.4 oz (70.9 kg)  08/11/22 161 lb 6.4 oz (73.2 kg)  05/16/22 160  lb (72.6 kg)   BMI Readings from Last 3 Encounters:  09/25/22 25.24 kg/m  08/11/22 26.45 kg/m  05/16/22 26.63 kg/m    Assessment/Interventions: Review of patient past medical history, allergies, medications, health status, including review of consultants reports, laboratory and other test data, was performed as part of comprehensive evaluation and provision of chronic care management services.   SDOH:  (Social Determinants of Health) assessments and interventions performed: Yes SDOH Interventions    Flowsheet Row Clinical Support from 10/13/2019 in [place] at [place] from 10/04/2018 in [place] at [place] from 09/18/2017 in [place] at [place]  SDOH Interventions     Depression Interventions/Treatment  AVW9-7 Score <4 Follow-up Not Indicated --  Merilyn Baba to PCP] --  [referral to PCP]      SDOH Screenings   Food Insecurity: No Food Insecurity (10/28/2021)  Housing: Low Risk  (10/28/2021)  Transportation Needs: No Transportation Needs (10/28/2021)  Alcohol Screen: Low Risk  (10/28/2021)  Depression (PHQ2-9): Low Risk  (10/28/2021)  Financial Resource Strain: Low Risk  (10/28/2021)  Physical Activity: Inactive (10/28/2021)  Social Connections: Moderately Integrated (10/28/2021)  Stress: No Stress Concern Present (10/28/2021)  Tobacco Use: Low Risk  (10/17/2022)    CCM Care Plan  Allergies  Allergen Reactions   Iohexol Hives and Shortness Of Breath     Code: HIVES, Desc: PT developed 2 hives, followed by SOB, severe headache post 87cc's Omnipaque 300., Onset Date: 94801655    Amitriptyline Other (See Comments)    Sedated next morning   Ciprofloxacin Nausea And Vomiting   Cymbalta [Duloxetine Hcl]  Other (See Comments)    Worsened depression   Lyrica [Pregabalin] Other (See Comments)    Tried during hospitalization - unsure effects but unable to tolerate   Imipramine Hcl Rash   Iodine Rash   Lidocaine Hcl Rash   Morphine Sulfate Rash   Neosporin [Neomycin-Bacitracin Zn-Polymyx] Rash and Other (See Comments)    Worsened skin breaking out   Sulfamethoxazole Rash   Tetracyclines & Related Rash    Medications Reviewed Today     Reviewed by Sharen Counter, CCC-SLP (Speech and Language Pathologist) on 10/17/22 at 1152  Med List Status: <None>   Medication Order Taking? Sig Documenting Provider Last Dose Status Informant  acetaminophen (TYLENOL) 650 MG CR tablet [id number]  Take 2 tablets (1,300 mg total) by mouth every 8 (eight) hours as needed for pain. Dwyane Dee, MD  Active Self  AMBULATORY Baruch Gouty MEDICATION [id number]  Lift chair Dx:  Tomasa Rand, DO  Active Self  Calcium Carbonate-Vitamin D (CALCIUM 600/VITAMIN D) 600-400 MG-UNIT chew tablet [id number]  Chew 1 tablet by mouth daily. [name], MD  Active Self           Med Note Wilmon Pali, [name]   Thu Jun 23, 2021  3:53 PM)    carbidopa-levodopa (SINEMET IR) 25-100 MG tablet [id number]  1 po tid prn  Patient taking differently: Take 2 tablets by mouth in the morning and at bedtime.   Ludwig Clarks, DO  Active Self  Cholecalciferol (VITAMIN D3) 25 MCG (1000 UT) CAPS [id number]  Take 1 capsule (1,000 Units total) by mouth daily. [name], MD  Active Self           Med Note Wilmon Pali, [name]   Thu Jun 23, 2021  3:54 PM)    clonazePAM (KLONOPIN) 0.5 MG tablet [id number]  Take 0.5 tablets (0.25 mg total) by mouth  every morning AND 1 tablet (0.5 mg total) at bedtime. [name], MD  Active   clotrimazole-betamethasone Donalynn Furlong) cream [id number]  Apply 1 application topically daily. [name], MD  Active Self  cyanocobalamin (VITAMIN B12) 1000 MCG/ML injection [id number]  INJECT 1 ML  (1,000 MCG TOTAL) INTO THE MUSCLE EVERY 30 DAYS. [name], MD  Active   denosumab (PROLIA) 60 MG/ML SOSY injection [id number]  Inject 60 mg into the skin every 6 (six) months. [provider]  Active Self           Med Note MAKYNZI, [place]   Tue May 02, 2022 11:45 AM)    fludrocortisone (FLORINEF) 0.1 MG tablet [id number]  TAKE 1 TABLET BY MOUTH EVERY DAY [name], MD  Active   gabapentin (NEURONTIN) 100 MG capsule [id number]  Take one capsule at bedtime. Ludwig Clarks, DO  Active   Glycerin-Hypromellose-PEG 400 (ARTIFICIAL TEARS) 0.2-0.2-1 % SOLN [id number]  Place 1 drop into both eyes daily as needed (dry eyes). [provider]  Active Self  latanoprost (XALATAN) 0.005 % ophthalmic solution [id number]  Place 1 drop into both eyes at bedtime. [provider]  Active Self  sertraline (ZOLOFT) 100 MG tablet [id number]  TAKE 1 TABLET BY MOUTH DAILY [name], MD  Active   SYRINGE-NEEDLE, DISP, 3 ML (BD SAFETYGLIDE SYRINGE/NEEDLE) 25G X 1" 3 ML MISC [id number]  Use to inject vitamin B12 monthly. [name], MD  Active Self  traMADol Veatrice Bourbon) 50 MG tablet [id number]  Take 1 tablet (50 mg total) by mouth every 6 (six) hours as needed for moderate pain. 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   Left shoulder pain 06/19/2020   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   Degenerative lumbar spinal  stenosis 11/27/2019   Knee pain, bilateral 08/06/2018   Constipation 08/06/2018   Right lumbar radiculopathy 04/10/2018   Dysphagia 10/09/2017   Chronic cough 07/16/2017   GAD (generalized anxiety disorder) 04/28/2016   Chronic insomnia 03/07/2016   Left Achilles tendinitis 12/08/2015   Parkinson's disease 08/25/2015   Advanced care planning/counseling discussion 05/26/2015   Family circumstance 02/23/2015   Encounter for general adult medical examination with abnormal findings 05/21/2014   MDD (major depressive disorder), single episode, moderate ([place]) 11/12/2013   DDD (degenerative disc disease), lumbar 05/31/2013   Medicare annual wellness visit, subsequent 05/20/2013   HLD (hyperlipidemia) 05/20/2013   Vitamin D deficiency 05/09/2013   Recurrent UTI 01/03/2011   Rash and other nonspecific skin eruption 05/11/2010   GERD 02/22/2010   CAD (coronary artery disease) 01/25/2010   Osteoporosis 11/10/2009   Orthostatic hypotension 12/06/2008   Anemia 09/25/2007   Vitamin B12 deficiency 05/03/2007   Fibromyalgia 05/03/2007    Immunization History  Administered Date(s) Administered   Fluad Quad(high Dose 65+) 08/12/2020, 11/02/2021, 09/21/2022   Influenza Split 08/18/2011, 10/11/2012   Influenza Whole 09/25/2007, 09/08/2008, 08/23/2009, 09/02/2010   Influenza, High Dose Seasonal PF 09/28/2019  Influenza,inj,Quad PF,6+ Mos 09/02/2013, 08/13/2014, 10/28/2015, 11/14/2016, 09/18/2017, 08/15/2018   Influenza,inj,quad, With Preservative 09/27/2018   PFIZER(Purple Top)SARS-COV-2 Vaccination 01/22/2020, 02/19/2020   Pneumococcal Conjugate-13 05/21/2014   Pneumococcal Polysaccharide-23 05/26/2015   Tdap 08/18/2011   Zoster, Live 10/12/2010    Conditions to be addressed/monitored:  Hyperlipidemia, Coronary Artery Disease, Depression, Anxiety, and Osteoporosis, Parkinson's Disease, Constipation  There are no care plans that you recently modified to display for this patient.      Medication Assistance: None required.  Patient affirms current coverage meets needs.  Compliance/Adherence/Medication fill history: Care Gaps: None  Star-Rating Drugs: None  Medication Access: Within the past 30 days, how often has patient missed a dose of medication? *** Is a pillbox or other method used to improve adherence? {YES/NO:21197} Factors that may affect medication adherence? {CHL DESC; BARRIERS:21522} Are meds synced by current pharmacy? {YES/NO:21197} Are meds delivered by current pharmacy? {YES/NO:21197} Does patient experience delays in picking up medications due to transportation concerns? {YES/NO:21197}  [facility] Services Reviewed: Is patient disadvantaged to use [facility]?: {YES/NO:21197} Current Rx insurance plan: *** Name and location of Current pharmacy:  [address] [place] [address] Phone: [phone] Fax: [phone]  [id number] [place] [facility] [address] [facility] [address] [address] [place]Phone: [phone]: [phone] [facility] services reviewed with patient today?: {YES/NO:21197} Patient requests to transfer care to [facility]?: {YES/NO:21197} Reason patient declined to change pharmacies: {US patient preference:27474}   Care Plan and Follow Up Patient Decision:  Patient agrees to Care Plan and Follow-up.  Plan: Telephone follow up appointment with care management team member scheduled for:  6 months  LCharlene Brooke PharmD, [place] CPP Clinical Pharmacist [phone]   Current Barriers:  Suboptimal therapeutic regimen for CAD  Pharmacist Clinical Goal(s):  Patient will adhere to plan to optimize therapeutic regimen for CAD as evidenced by report of adherence to recommended medication management changes through  collaboration with PharmD and provider.   Interventions: 1:1 collaboration with GRia Bush MD regarding development and update of comprehensive plan of care as evidenced by provider attestation and co-signature Inter-disciplinary care team collaboration (see longitudinal plan of care) Comprehensive medication review performed; medication list updated in electronic medical record  Orthostatic hypotension (Goal: maintain BP >100/60) -Controlled - pt reports she does not have "spells" like she used to since taking fludrocortisone; she is not checking BP at home regularly -Home BP: not checking unless feels like its low -Current treatment  Fludrocortisone 0.1 mg daily AM - Appropriate, Effective, Safe, Accessible -Medications previously tried: Northera  -Counseled on indication/benefits of fludrocortisone -Advised pt to monitor BP at home a few days a week to ensure it is at goal  Depression/Anxiety (Goal: manage symptoms) -Controlled - pt reports mood is under control currently; she does not endorse worsening anxiety since reducing clonazepam a few weeks ago; she reports overall fatigue has not improved since reducing clonazepam either -Current treatment: Sertraline 100 mg daily - Appropriate, Effective, Safe, Accessible Clonazepam 0.5 mg - 1/2 tab AM and 1 tab PM - Appropriate, Effective, Safe, Accessible -Medications previously tried/failed: amitriptyline, duloxetine, imipramine -PHQ9: 9 (10/2020) -GAD7: 6 (10/2020) -Connected with PCP/neurology for mental health support -Educated on Benefits of medication for symptom control -Recommended to continue current medication  Osteoporosis (Goal prevent fractures) -Controlled - pt is up-to-date with Prolia  injections; she is compliant with calcium/Vitamin D regimen -Last DEXA Scan: 11/04/19 > 10/11/22  T-Score femoral neck: -2.8 > -2.6  T-Score lumbar spine: -0.1 > -0.5  T-Score total hip: -1.7  T-Score forearm radius: -3.1 -Patient  is a candidate for pharmacologic treatment due to T-Score < -2.5 in femoral neck -Current treatment  Prolia 60 mg q6 months (started 03/2017, last given 09/21/22) - Appropriate, Effective, Safe, Accessible Calcium-Vitamin D 600-400 -Appropriate, Effective, Safe, Accessible Vitamin D 1000 IU -Appropriate, Effective, Safe, Accessible -Recommend weight-bearing and muscle strengthening exercises for building and maintaining bone density -Recommended to continue current medication  Parkinson's disease (Goal: manage syptoms) -Controlled - pt reports she has not need needed Sinemet in several weeks; she still endorses fatigue during the day -DBS 07/07/21 -Current treatment  Carbidopa-levodopa 25-100 mg - 2 tab BID - Appropriate, Effective, Safe, Accessible -Medications previously tried: pramipexole  -Recommended to continue current medication  Pain (Goal: manage symptoms) -Controlled - pt reports knee pain, taking daily Tylenol and Tramadol and just recently started PT for Parkinson's -Hx lumbar radiculopathy, DDD and fibromyalgia -Current treatment  Tylenol 650 mg - 2 every every 6 hours (usually BID) Tramadol 50 mg BID Gabapentin 100 mg HS - not taking? -Medications previously tried: lyrica, lidocaine  -Counseled on tramadol side effects including fatigue;  -Advised to use Tylenol in AM and Tramadol in PM to help with daytime fatigue  Hyperlipidemia / CAD: (LDL goal < 70) -Not ideally controlled - LDL 130 (10/2021) is above goal -Hx CAD - MI vs cardiomyopathy noted, likely no ischemic disease per PCP.  -Hx mild aortic atherosclerosis (CT 04/2021); ASCVD risk 14.4% -there is no record of prior statin use -Current treatment: None -Medications previously tried: none  -Considered starting statin therapy; will defer to later date given current Parkinson's treatment adjustments  Chronic constipation (Goal: manage symptoms) -Uncontrolled - pt reports Miralax, fiber supplements hurt her stomach  and docusate does not work; she is interested in starting an Rx option for constipation *** -Medications previously tried: Market researcher (cost), Amitiza (cost)  -{CCMPHARMDINTERVENTION:25122}  Health Maintenance -Vaccine gaps: Shingrix, TDAP -Prediabetes - A1c 5.9% (03/2022) -Current therapy:  Artificial tears Latanoprost 0.005% eye drops HS Vitamin B12 1000 mg IM q30 days -Recommended to continue current medication  Patient Goals/Self-Care Activities Patient will:  - take medications as prescribed -focus on medication adherence by pill box -check blood pressure 1-2 times weekly

## 2022-10-27 NOTE — Telephone Encounter (Signed)
Plz schedule CPE and lab visits.

## 2022-10-27 NOTE — Telephone Encounter (Signed)
Noted  

## 2022-10-27 NOTE — Telephone Encounter (Signed)
Left message for patient to call back and schedule Medicare Annual Wellness Visit (AWV) either virtually or phone  Left  my [name] number [phone]   Last AWV 10/28/21 please schedule with Nurse Health Adviser   45 min for awv-i and in office appointments 30 min for awv-s  phone/virtual appointments

## 2022-10-27 NOTE — Telephone Encounter (Signed)
Patient is on Medicare [facility] list (Statin Therapy for patients with Cardiovascular disease) because she has documented diagnosis of CAD and is not on a statin. Her last LDL was 130 (10/2021), there is no documented history of statin trial in her chart. Notably, hx of CAD is questionable - Noted MI in 2011 in s/o critical illness - "Thought Takotsubo cardiomyopathy, less likely MI versus cardiomyopathy of sepsis. Heart function normalized on recheck, likely no ischemic cardiac disease". She does have aortic atherosclerosis documented per CT scan 04/2021. 10-year ASCVD risk is 14.4%.  Even without history of MI, statin trial is reasonable given moderate ASCVD risk and aortic atherosclerosis on CT. Pt is due for her annual visit with PCP this month, I was unable to reach patient to discuss but would recommend she schedule annual visit, repeat lipid panel and discuss statin with PCP.

## 2022-10-27 NOTE — Telephone Encounter (Signed)
  Chronic Care Management   Outreach Note  10/27/2022 Name: [name] MRN: [id number] DOB: 27-Jun-1946  Referred by: [name], MD  Patient had a phone appointment scheduled with clinical pharmacist today.  An unsuccessful telephone outreach was attempted today. The patient was referred to the pharmacist for assistance with medications, care management and care coordination.   Patient will NOT be penalized in any way for missing a CCM appointment. The no-show fee does not apply.  If possible, a message was left to return call to: [phone] or to [facility].   [name], PharmD, BCACP Clinical Pharmacist [facility] [phone]

## 2022-10-27 NOTE — Telephone Encounter (Signed)
Agree with schedule CPE in this month. May place in open 30 min slot. Thanks.

## 2022-10-27 NOTE — Telephone Encounter (Signed)
Name of Medication: Clonazepam, Tramadol Name of Pharmacy: [facility] [address] Last Venida Jarvis or Written Date and Quantity: 09/27/22      Clonazepam-#45      Tramadol- #30 Last Office Visit and Type: 04/26/22, pre-op eval Next Office Visit and Type: none Last Controlled Substance Agreement Date:  Last UDS:

## 2022-10-27 NOTE — Telephone Encounter (Signed)
LVM for patient to call and schedule

## 2022-10-29 MED ORDER — TRAMADOL HCL 50 MG PO TABS: 50.0000 mg | ORAL_TABLET | Freq: Four times a day (QID) | ORAL | 0 refills | Status: DC | PRN

## 2022-10-29 MED ORDER — CLONAZEPAM 0.5 MG PO TABS: ORAL_TABLET | ORAL | 0 refills | Status: DC

## 2022-10-29 NOTE — Telephone Encounter (Signed)
ERx 

## 2022-10-30 ENCOUNTER — Other Ambulatory Visit: Payer: Self-pay | Admitting: Family Medicine

## 2022-10-30 NOTE — Telephone Encounter (Signed)
Noted  

## 2022-10-30 NOTE — Telephone Encounter (Signed)
Lvmtcb, called contacts on file as well, lvmtcb

## 2022-10-30 NOTE — Telephone Encounter (Signed)
Duplicate request.  Rx sent on 10/29/22, #30/0 to [facility]

## 2022-10-30 NOTE — Telephone Encounter (Signed)
Duplicate request

## 2022-10-31 ENCOUNTER — Telehealth: Payer: Self-pay | Admitting: Neurology

## 2022-10-31 DIAGNOSIS — R519 Headache, unspecified: Secondary | ICD-10-CM

## 2022-10-31 DIAGNOSIS — G20B1 Parkinson's disease with dyskinesia, without mention of fluctuations: Secondary | ICD-10-CM

## 2022-10-31 NOTE — Telephone Encounter (Signed)
Spoke to patient and she said she suffered dizzy spells while taking the Neurontin. She stopped taking medication and she no longer felt dizzy

## 2022-10-31 NOTE — Telephone Encounter (Signed)
Patient states that she was not able to take the neurontin and wants to know if here is something else that she can take  [facility]   [facility] [address] [address]

## 2022-11-01 ENCOUNTER — Other Ambulatory Visit: Payer: Self-pay

## 2022-11-01 DIAGNOSIS — G20B1 Parkinson's disease with dyskinesia, without mention of fluctuations: Secondary | ICD-10-CM

## 2022-11-01 DIAGNOSIS — R519 Headache, unspecified: Secondary | ICD-10-CM

## 2022-11-01 MED ORDER — PREGABALIN 50 MG PO CAPS: 50.0000 mg | ORAL_CAPSULE | Freq: Every day | ORAL | 1 refills | Status: DC

## 2022-11-01 NOTE — Telephone Encounter (Signed)
Patient is willing to try the medication because it is only at night for the dose

## 2022-11-01 NOTE — Telephone Encounter (Signed)
Called patient and she did not remember ever having an allergy to Lyrica or what caused it and is willing to try it

## 2022-11-01 NOTE — Telephone Encounter (Signed)
I spoke to the patient and her son remembers her taking the medication and it causing her dizziness

## 2022-11-01 NOTE — Telephone Encounter (Signed)
Pt called stating that she remembers having dizziness when taking Lyrica. Requests call back.

## 2022-11-02 ENCOUNTER — Other Ambulatory Visit: Payer: Self-pay | Admitting: Neurology

## 2022-11-02 MED ORDER — PREGABALIN 50 MG PO CAPS: 50.0000 mg | ORAL_CAPSULE | Freq: Three times a day (TID) | ORAL | 1 refills | Status: DC

## 2022-11-07 ENCOUNTER — Telehealth: Payer: Self-pay | Admitting: Family Medicine

## 2022-11-07 NOTE — Telephone Encounter (Signed)
LVM for pt to rtn my call to schedule AWV with [facility] call back # [phone] 

## 2022-11-09 ENCOUNTER — Other Ambulatory Visit: Payer: Self-pay | Admitting: Family Medicine

## 2022-11-09 DIAGNOSIS — F411 Generalized anxiety disorder: Secondary | ICD-10-CM

## 2022-11-09 DIAGNOSIS — F321 Major depressive disorder, single episode, moderate: Secondary | ICD-10-CM

## 2022-11-13 DIAGNOSIS — M48061 Spinal stenosis, lumbar region without neurogenic claudication: Secondary | ICD-10-CM | POA: Diagnosis not present

## 2022-11-15 ENCOUNTER — Other Ambulatory Visit: Payer: Self-pay | Admitting: Family Medicine

## 2022-11-15 NOTE — Telephone Encounter (Signed)
Florinef Last filled:  10/21/22, #30 Last OV: 04/26/22, pre-op eval Next OV: 01/10/23, CPE

## 2022-12-01 ENCOUNTER — Telehealth: Payer: Self-pay | Admitting: Family Medicine

## 2022-12-01 NOTE — Telephone Encounter (Signed)
LVM for pt to rtn my call to schedule AWV with [facility] call back # [phone] 

## 2022-12-05 ENCOUNTER — Encounter: Payer: Self-pay | Admitting: Physical Therapy

## 2022-12-05 ENCOUNTER — Ambulatory Visit: Payer: Medicare Other | Attending: Neurology | Admitting: Physical Therapy

## 2022-12-05 ENCOUNTER — Ambulatory Visit: Payer: Medicare Other | Admitting: Speech Pathology

## 2022-12-05 ENCOUNTER — Other Ambulatory Visit: Payer: Self-pay | Admitting: Family Medicine

## 2022-12-05 DIAGNOSIS — M5459 Other low back pain: Secondary | ICD-10-CM | POA: Insufficient documentation

## 2022-12-05 DIAGNOSIS — R2681 Unsteadiness on feet: Secondary | ICD-10-CM | POA: Insufficient documentation

## 2022-12-05 DIAGNOSIS — G20B1 Parkinson's disease with dyskinesia, without mention of fluctuations: Secondary | ICD-10-CM

## 2022-12-05 DIAGNOSIS — R293 Abnormal posture: Secondary | ICD-10-CM | POA: Insufficient documentation

## 2022-12-05 DIAGNOSIS — R262 Difficulty in walking, not elsewhere classified: Secondary | ICD-10-CM | POA: Diagnosis not present

## 2022-12-05 DIAGNOSIS — M6281 Muscle weakness (generalized): Secondary | ICD-10-CM | POA: Diagnosis not present

## 2022-12-05 DIAGNOSIS — R471 Dysarthria and anarthria: Secondary | ICD-10-CM | POA: Diagnosis not present

## 2022-12-05 NOTE — Therapy (Signed)
OUTPATIENT SPEECH LANGUAGE PATHOLOGY TREATMENT   Patient Name: [name] MRN: [id number] DOB:1946-05-16, 77 y.o., female Today's Date: 12/05/2022  PCP: [name], MD REFERRING PROVIDER: Alonza Bogus, DO        Past Medical History:  Diagnosis Date   Allergy    ANEMIA-NOS 09/25/2007   Anxiety    Arthritis    B12 DEFICIENCY 05/03/2007   Blood transfusion without reported diagnosis    CAD (coronary artery disease) 01/2010   MI, Nishan   Cardiomyopathy ([place]) 02/08/2010   H/o this 2012 after urosepsis, no recurrence.    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   OSTEOPOROSIS 08/2009   bisphosphonate on hold 2/2 dysphagia, on reclast done in August each year   Parkinson's disease 08/25/2015   Dx Dr [name] 07/2015    Pneumonia    PONV (postoperative nausea and vomiting)    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   Takotsubo cardiomyopathy 2008   due to E coli urosepsis   Past Surgical History:  Procedure Laterality Date   ABDOMINAL HYSTERECTOMY  1970s   IUD infection - first partial then with oophorectomy (cysts), complication - low blood pressure   CATARACT EXTRACTION Bilateral    CHOLECYSTECTOMY     complication - low blood pressure   COLONOSCOPY  06/2008   h/o polyps but latest WNL, rec rpt 10 yrs Olevia Perches)   COLONOSCOPY  11/2018   multiple TAs (10 polyps total), rpt 2 yrs Fuller Plan)   COLONOSCOPY  04/2021   multiple TAs,  rpt 3 yrs Fuller Plan)   CYSTOSCOPY  12/2013   abx treatment for recurrent cystitis   DEXA  04/2013   T -2.9 @ femur, -1.6 @ spine   DEXA  04/2017   T -2.9 hip, -0.7 spine   ESOPHAGOGASTRODUODENOSCOPY  12/2017   WNL, regardless esophagus dilated, small HH Fuller Plan)   EYE SURGERY     LUMBAR LAMINECTOMY/DECOMPRESSION MICRODISCECTOMY Right 11/27/2019   Right Lumbar Four-Five foraminotomy;  Erline Levine, MD)   LUMBAR LAMINECTOMY/DECOMPRESSION MICRODISCECTOMY Right 05/16/2022   Procedure: OPEN LUMBAR LAMINECTOMY, RT, L45 W/LATERAL RECESS DECOMPRESSION;  Surgeon: Karsten Ro, DO;  Location: [place];  Service: Neurosurgery;  Laterality: Right;  3C   MINOR PLACEMENT OF FIDUCIAL N/A 06/30/2021   Procedure: MINOR PLACEMENT OF FIDUCIAL;  Surgeon: Erline Levine, MD;  Location: [place];  Service: Neurosurgery;  Laterality: N/A;  Minor room   PTOSIS REPAIR Bilateral 10/2020   Plastic Surgery   PULSE GENERATOR IMPLANT Right 07/14/2021   Procedure: UNILATERAL PULSE GENERATOR IMPLANT;  Surgeon: Erline Levine, MD;  Location: [place];  Service: Neurosurgery;  Laterality: Right;   SUBTHALAMIC STIMULATOR INSERTION Bilateral 07/07/2021   Procedure: Deep brain stimulator placement;  Surgeon: Erline Levine, MD;  Location: [place];  Service: Neurosurgery;  Laterality: Bilateral;   TUBAL LIGATION     UPPER GASTROINTESTINAL ENDOSCOPY     Patient  Active Problem List   Diagnosis Date Noted   Lumbar radiculopathy, chronic 05/16/2022   Pre-op evaluation 04/26/2022   CAP (community acquired pneumonia) 03/29/2022   Localized swelling of chest wall 12/23/2021   Other fatigue 11/25/2021   Sinus pain 11/25/2021   Lymphadenopathy 11/25/2021   Parkinson's disease with dyskinesia 05/28/2021   Closed fracture of rib of right side with routine healing 05/28/2021   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   Left shoulder pain 06/19/2020   Herpes simplex  keratitis of right eye 02/17/2020   Degenerative lumbar spinal stenosis 11/27/2019   Knee pain, bilateral 08/06/2018   Constipation 08/06/2018   Right lumbar radiculopathy 04/10/2018   Dysphagia 10/09/2017   Chronic cough 07/16/2017   GAD (generalized anxiety disorder) 04/28/2016   Chronic insomnia 03/07/2016   Left Achilles tendinitis 12/08/2015   Parkinson's disease 08/25/2015   Advanced care planning/counseling discussion 05/26/2015   Family circumstance 02/23/2015   Encounter for general adult medical examination with abnormal findings 05/21/2014   MDD (major depressive disorder), single episode, moderate ([place]) 11/12/2013   DDD (degenerative disc disease), lumbar 05/31/2013   Medicare annual wellness visit, subsequent 05/20/2013   HLD (hyperlipidemia) 05/20/2013   Vitamin D deficiency 05/09/2013   Recurrent UTI 01/03/2011   Rash and other nonspecific skin eruption 05/11/2010   GERD 02/22/2010   CAD (coronary artery disease) 01/25/2010   Osteoporosis 11/10/2009   Orthostatic hypotension 12/06/2008   Anemia 09/25/2007   Vitamin B12 deficiency 05/03/2007   Fibromyalgia 05/03/2007    ONSET DATE: Script written September 2023  REFERRING DIAG: G20 (ICD-10-CM) - Parkinson's disease   THERAPY DIAG:  Dysarthria and anarthria  Rationale for Evaluation and Treatment Rehabilitation  SUBJECTIVE:   SUBJECTIVE STATEMENT: "[name] had to cancel last week because I was taking some medicine and it made me drunk. I'm not taking it anymore" Pt accompanied by: self  PERTINENT HISTORY: PMHx significant for Parkinsons disease w/ DBS 06/2021, Hx of orthostatic hypotension, anxiety/depression, RUE/RLE reflex sympathetic dystrophy, osteoporosis, lupus, fibromyalgia, CHF and OA. Lumbar sx June 2023. Pt with evaluation for voice/dysarthria in Oct 2022 but did not return after eval due to medical reason.  PAIN:  Are you having pain? No  LIVING ENVIRONMENT: Lives with: lives with their family  (son) Lives in: House/apartment  PLOF:  Level of assistance: Independent with ADLs, Comment: does most housework Employment: Retired  PATIENT GOALS: Talk louder  OBJECTIVE:   DIAGNOSTIC FINDINGS:  Clinical Impression - MBSS   05-10-22             Patient presents with a very mild oropharyngeal dysphagia with primary impairments being delays in anterior to posterior transit of solid texture boluses in oral cavity and mildly delayed pharyngeal peristalsis of puree and mechanical soft solids. She did exhibit mildly delayed swallow initiation of mechanical soft solids at level of vallecular sinus and trace residuals above UES opening. No other pharyngeal residuals observed with any of the tested consistencies (thin liquids, puree solids, mechanical soft solids, 1/2 barium tablet. No penetration or aspiration observed with liquids or solids. Esophageal sweep did not reveal significant amount of barium stasis and esophageal transit appeared WFL. (this is an improvement as compared to 2018 MBS). Patient would cough after most swallows in absence of any observed penetration or aspiration. She did also have a cough prior to any PO's being given and when asked most recent time she ate, she reported that she has not had  anything to eat or drink since last night. SLP reviewed MBS from 2018 which reports "no airway compromise despite frequent coughing immediately post-swallow". Unfortunately, results of this MBS do not explain patient's frequent coughing that occurs immediately after swallow PO's. Swallow Evaluation Recommendations  SLP Diet Recommendations: Regular solids;Thin liquid  Liquid Administration via: Straw;Cup  Medication Administration: Whole meds with liquid  Compensations: Minimize environmental distractions;Slow rate;Small sips/bites   PATIENT REPORTED OUTCOME MEASURES (PROM): Communication Effectiveness Survey: to be completed in first 1-2 sessions   TODAY'S TREATMENT:   12/05/22:    Good morning! 2. Are you ready for school? 3. Have a good day!  4. I love you 5. Have you got any laundry?  6. Are we going anywhere today?  7. Have you eaten?  8. Does anybody have a doctor's appointment today? 9. How was your day?  10.What's for dinner?  Coversational loudness: 59 dB  Initial "hey": 65 dB With cueing mid 70s dB  10/17/22: [name] has done loud "hey"s at home 5/14 days since last session. SLP told pt that it was imperative she complete loud "hey" x10, BID. Pt going on hold today until January 2024 due to moving into new house and the holidays.  She forgot her 10 everyday sentences today again - SLP told pt to bring them next session and then we can copy them and leave them here. Loud "hey" today at low-mid 80s with initial cue for loudness. In conversation pt's average was mid-upper 60s dB, but pt had rare rushes of speech which decr'd overall intelligibility. In paragraph reading, pt averaged low 70s dB, but again demonstrated rushes of speech. In addition SLP encouraged pt to overarticulate when reading to minimize impact of rushes of speech. Pt intelligibility improved when she did both of these, to 95-100%.   10/03/22: SLP had pt start with "hey"s and produced mid-upper 70s dB x2. SLP provided pt a model x3 and she incr'd productions to low-mid 80s dB. She req'd usual mod cues for loudness and usual mod-max cues for breath support prior to each "hey!". Pt has done loud "hey" at least once/day, but has not read out loud for 30 seconds. SLP told pt to perform "hey" 10 reps BID, and read out loud 30 seconds x4 reps. Pt's "everyday sentences" had 6/8 that were really said everyday. SLP hjad pt read these with LOUD voice and averaged upper 70s dB. She req'd usual mod A for loudness faded to occasional min A, and occasional min-mod A for breath support prior to each sentence. In reading paragraphs, [name] averaged upper 60s-low 70s dB and said she felt like she was talking too  loudly. SLP used auditory feedback (recorded her reading) to prove to her that her volume was WNL. 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  10/24/23Estill Bamberg has not done small sips at home.  Pt started with "hey"s and produced mid-upper 70s dB x2. SLP provided pt a model and she incr'd productions to mid 62s dB. In divergent naming tasks pt req'd rare min A for louder speech, and in semi-structured sentence response tasks pt req'd rare min A for loudness and loudness decay. SLP during this time reminded pt that her volume was normally loud (70-72 dB) and that was the loudness she produced prior to Parkinson's (PD).  SLP gave pt homework to generate 10 everyday sentences, to cont with HEY, and  the reading (if she can do so).  09/12/22: SLP discussed results of evaluation with pt, and educated pt re: loud "hey", and her work to do at home - see pt instructions   PATIENT EDUCATION: Education details: see above in "today's treatment" Person educated: Patient Education method: Explanation, Demonstration, Verbal cues, and Handouts Education comprehension: verbalized understanding, returned demonstration, verbal cues required, and needs further education  HOME EXERCISE PROGRAM: 10 loud "hey", read out loud for 30 seconds x4, BID   GOALS: Goals reviewed with patient? No  SHORT TERM GOALS: Target date: 10/24/2022    pt will produce loud /a/ or "hey" with low 80s dB average in 3 sessions  Baseline:09-19-22, 10-17-22 Goal status: ongoing  2.  pt will demo abdominal breathing at rest 75% of the time in 3 sessions  Baseline:  Goal status: Deferred - worked on volume only  3.  [name] will answer 15-20 mod complex "wh" questions with average volume in upper 60s dB in 3 sessions  Baseline:  Goal status: ongoing  4.  Pt will complete HEP 75% between sessions Baseline:  Goal status:  ongoing   LONG TERM GOALS: Target date: 12/05/2022    pt will demonstrate abdominal breathing in simple Q and A responses 75% of the time in 3 sessions  Baseline:  Goal status: Ongoing  2.  [name] will produce speech volume in 3 minutes simple conversation with average mid-upper 60s dB in 3 sessions  Baseline:  Goal status: Ongoing  3.  Pt will produce speech volume of upper 60s dB in 5 minutes simple conversation with occasional nonverbal cues in 3 sessions  Baseline:  Goal status: Ongoing  4.  Pt will improve PROM measurement when compared to initial session, when given in last week of speech therapy Baseline:  Goal status: Ongoing    5.  SLP to monitor pt's intake of liquids to ensure pulmonary safety    Baseline:    Goal Status: Ongoing  ASSESSMENT:  CLINICAL IMPRESSION: Pt told SLP today that she must be put on hold until January 2024 due to moving into a new home and the holidays. Pt's STG target date was also changed due to pt's visit count. Patient is a 77 y.o. female who was seen today for treatment of dysarthria/voice in light of PD. SLP to monitor/possibly target aspiration precautions for pt's liquid intake to ensure pulmonary safety.  OBJECTIVE IMPAIRMENTS: Objective impairments include dysarthria and voice disorder. These impairments are limiting patient from household responsibilities, ADLs/IADLs, and effectively communicating at home and in community.Factors affecting potential to achieve goals and functional outcome are  none .Marland Kitchen Patient will benefit from skilled SLP services to address above impairments and improve overall function.  REHAB POTENTIAL: Good  PLAN:  SLP FREQUENCY: 2x/week  SLP DURATION: 12 weeks  PLANNED INTERVENTIONS: Aspiration precaution training, Pharyngeal strengthening exercises, Diet toleration management , Cueing hierachy, Internal/external aids, Oral motor exercises, Functional tasks, SLP instruction and feedback, Compensatory strategies,  and Patient/family education    [place], CCC-SLP 12/05/2022, 11:16 AM

## 2022-12-05 NOTE — Therapy (Signed)
OUTPATIENT PHYSICAL THERAPY NEURO   Patient Name: [name] MRN: [id number] DOB:1946/01/05, 77 y.o., female Today's Date: 12/05/2022   PCP: Leary Roca PROVIDER: [name]   PT End of Session - 12/05/22 1017     Visit Number 9    Date for PT Re-Evaluation 01/05/23    Authorization Type [employer] Medicare    PT Start Time 1013    PT Stop Time 1100    PT Time Calculation (min) 47 min    Activity Tolerance Patient tolerated treatment well    Behavior During Therapy [facility] for tasks assessed/performed             Past Medical History:  Diagnosis Date   Allergy    ANEMIA-NOS 09/25/2007   Anxiety    Arthritis    B12 DEFICIENCY 05/03/2007   Blood transfusion without reported diagnosis    CAD (coronary artery disease) 01/2010   MI, Nishan   Cardiomyopathy ([place]) 02/08/2010   H/o this 2012 after urosepsis, no recurrence.    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   OSTEOPOROSIS 08/2009   bisphosphonate on hold 2/2 dysphagia, on reclast done in August each year   Parkinson's disease 08/25/2015   Dx Dr [name] 07/2015    Pneumonia    PONV (postoperative nausea and vomiting)    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   Takotsubo cardiomyopathy 2008   due to E coli urosepsis   Past Surgical History:  Procedure Laterality Date   ABDOMINAL HYSTERECTOMY  1970s   IUD infection - first partial then with oophorectomy (cysts), complication - low  blood pressure   CATARACT EXTRACTION Bilateral    CHOLECYSTECTOMY     complication - low blood pressure   COLONOSCOPY  06/2008   h/o polyps but latest WNL, rec rpt 10 yrs Olevia Perches)   COLONOSCOPY  11/2018   multiple TAs (10 polyps total), rpt 2 yrs Fuller Plan)   COLONOSCOPY  04/2021   multiple TAs, rpt 3 yrs Fuller Plan)   CYSTOSCOPY  12/2013   abx treatment for recurrent cystitis   DEXA  04/2013   T -2.9 @ femur, -1.6 @ spine   DEXA  04/2017   T -2.9 hip, -0.7 spine   ESOPHAGOGASTRODUODENOSCOPY  12/2017   WNL, regardless esophagus dilated, small HH Fuller Plan)   EYE SURGERY     LUMBAR LAMINECTOMY/DECOMPRESSION MICRODISCECTOMY Right 11/27/2019   Right Lumbar Four-Five foraminotomy;  Erline Levine, MD)   LUMBAR LAMINECTOMY/DECOMPRESSION MICRODISCECTOMY Right 05/16/2022   Procedure: OPEN LUMBAR LAMINECTOMY, RT, L45 W/LATERAL RECESS DECOMPRESSION;  Surgeon: Karsten Ro, DO;  Location: [place];  Service: Neurosurgery;  Laterality: Right;  3C   MINOR PLACEMENT OF FIDUCIAL N/A 06/30/2021   Procedure: MINOR PLACEMENT OF FIDUCIAL;  Surgeon: Erline Levine, MD;  Location: [place];  Service: Neurosurgery;  Laterality: N/A;  Minor room   PTOSIS REPAIR Bilateral 10/2020   Plastic Surgery  PULSE GENERATOR IMPLANT Right 07/14/2021   Procedure: UNILATERAL PULSE GENERATOR IMPLANT;  Surgeon: Erline Levine, MD;  Location: [place];  Service: Neurosurgery;  Laterality: Right;   SUBTHALAMIC STIMULATOR INSERTION Bilateral 07/07/2021   Procedure: Deep brain stimulator placement;  Surgeon: Erline Levine, MD;  Location: [place];  Service: Neurosurgery;  Laterality: Bilateral;   TUBAL LIGATION     UPPER GASTROINTESTINAL ENDOSCOPY     Patient Active Problem List   Diagnosis Date Noted   Lumbar radiculopathy, chronic 05/16/2022   Pre-op evaluation 04/26/2022   CAP (community acquired pneumonia) 03/29/2022   Localized swelling of chest wall 12/23/2021   Other fatigue 11/25/2021   Sinus pain 11/25/2021   Lymphadenopathy  11/25/2021   Parkinson's disease with dyskinesia 05/28/2021   Closed fracture of rib of right side with routine healing 05/28/2021   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   Left shoulder pain 06/19/2020   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   Degenerative lumbar spinal stenosis 11/27/2019   Knee pain, bilateral 08/06/2018   Constipation 08/06/2018   Right lumbar radiculopathy 04/10/2018   Dysphagia 10/09/2017   Chronic cough 07/16/2017   GAD (generalized anxiety disorder) 04/28/2016   Chronic insomnia 03/07/2016   Left Achilles tendinitis 12/08/2015   Parkinson's disease 08/25/2015   Advanced care planning/counseling discussion 05/26/2015   Family circumstance 02/23/2015   Encounter for general adult medical examination with abnormal findings 05/21/2014   MDD (major depressive disorder), single episode, moderate ([place]) 11/12/2013   DDD (degenerative disc disease), lumbar 05/31/2013   Medicare annual wellness visit, subsequent 05/20/2013   HLD (hyperlipidemia) 05/20/2013   Vitamin D deficiency 05/09/2013   Recurrent UTI 01/03/2011   Rash and other nonspecific skin eruption 05/11/2010   GERD 02/22/2010   CAD (coronary artery disease) 01/25/2010   Osteoporosis 11/10/2009   Orthostatic hypotension 12/06/2008   Anemia 09/25/2007   Vitamin B12 deficiency 05/03/2007   Fibromyalgia 05/03/2007    ONSET DATE: 06/28/2022  REFERRING DIAG: Parkinson's, S/P lumbar surgery  THERAPY DIAG:  Parkinson's disease with dyskinesia, unspecified whether manifestations fluctuate  Difficulty in walking, not elsewhere classified  Muscle weakness (generalized)  Other low back pain  Rationale for Evaluation and Treatment Rehabilitation  SUBJECTIVE:                                                                                                                                                                                               SUBJECTIVE STATEMENT:    We have not seen patient since Thanksgiving.  She moving into a new home and having  car issues.  She reports that she has had one fall in the new house, reports that her legs just gave out as she was stooping.  Reports that she has been having increased pain for the past 3 weeks.  Reports some pain in the legs as well Pt accompanied by: self  PERTINENT HISTORY: see above, did have lumbar surgery in June, has a deep brain stimulator for movement disorder  PAIN:  Are you having pain? Yes, pain a 10/10 in the L/S area Takes pain med, Tylenol to help, recently the best it has been is a 6/10 reports that she has had to use the walker recently, standing and getting up from sitting will increase the pain  PRECAUTIONS: None  WEIGHT BEARING RESTRICTIONS No  FALLS: Has patient fallen in last 6 months? No  LIVING ENVIRONMENT: Lives with: lives with their family Lives in: House/apartment Stairs: Yes: Internal: 12 steps; can reach both Has following equipment at home: Single point cane and Walker - 4 wheeled  PLOF: Independent, does some housework  PATIENT GOALS:  be stronger, walk longer and better  OBJECTIVE:   COGNITION: Overall cognitive status: Within functional limits for tasks assessed   SENSATION: WFL Palpation she is very tender and tight in the lumbar area  POSTURE: rounded shoulders, forward head, and decreased lumbar lordosis  LOWER EXTREMITY ROM:    Motions are WFL's but she has poor control of the right LE, weakness and dyskinesia  LOWER EXTREMITY MMT:    MMT Right Eval Left Eval Right and left 12/05/22  Hip flexion 3+ 4 3+  Hip extension 4- 4 3+  Hip abduction 4- 4 3+  Hip adduction 4- 4 3+  Hip internal rotation     Hip external rotation     Knee flexion 3+ 4 3+  Knee extension 3+ 4 3+  Ankle dorsiflexion 4- 4- 4-  Ankle plantarflexion     Ankle inversion     Ankle eversion 3+ 4-   (Blank rows = not  tested)  TRANSFERS:  Has to use hands, if not falls back into chair  GAIT: Gait pattern: has extraneous motions of the right arm and the right leg and the shoulders and head, off balance at times but typically corrects on own, right LE tends to circumduct and has decreased control of the knee and the ankle Distance walked: 400 feet Assistive device utilized: None Level of assistance: Complete Independence Comments: on stairs does one at a time  FUNCTIONAL TESTs:  5 times sit to stand: 21 seconds  10/17/22 13.38 sec but decreased eccentric conrtrol.  12/05/22 = 55 seconds with back pain and definite need for hands Timed up and go (TUG): 17 seconds  10/17/22 9.2 sec without AD, 1,9,24 with SPC = 42 secondsa BERG balance 39/56  10/17/22 45/56  TODAY'S TREATMENT:  12/05/22 TUG with SPC 42 seconds 5XSTS 55 seconds needing to use hands and struggling Issued HEP Talked about body mechanics for up and down from sitting and lying down as well as bed mobility MHP/IFC to the low back in sitting   10/17/22 Nustep L 5 6 min Func testing performed 5 times sit to stand: 21 seconds  10/17/22 13.38 sec but decreased eccentric conrtrol Timed up and go (TUG): 17 seconds  10/17/22 9.2 sec without AD BERG balance 39/56  10/17/22 45/56 UBE L 3 2 min fwd/2 min back Resisted gait 4 ways 4 x 30# CG-min A HS curls 25# 2 sets 10 Knee ext 15# 2  sets 10 Side stepping in and out of 6 inch box on foam pads 6 inch alt box taps on foam mat 20 x 2 set s-min A    10/03/22 Nustep L 5 10mn Bike L 4 674m UBE L 3 73m1mResisted gait 4 ways 4 x 30# Black bar 20x heel raise/toe raise 6 inch step up 10 x each leg from airex 6 inch step up and over- min A with 2 LOB requiring assistance to prevent fall STS on airex with wt ball toss 2 sets 5- good righting reaction but LE weakness noted Tandem gait fwd and back on foam beam in // bars 5 x- occasional UE use Side stepping foam beam // bars 5 x-occasional UE use HS  curls 25# 2 sets 10 Knee ext 15# 2 sets 10  09/26/22 Nustep level 5 x 6 minutes Bike level 4 x 6 minutes UBE level 3 x 6 minutes Walking ball toss On airex ball toss Volleyball Direction changes, speed changes  09/21/22 Walk around the building in the back good pace, some extra motions are more pronounced this AM Nustep level 5 x 6 minutes UBE level 3 x 5 minutes 5# straight arm pulls Leg press 20#  2x10 Red tband DF/PF Volleyball On airex cone toe touches Side stepping back wards walk, direction changes Walking ball toss fwd and backward  09/19/22  Nustep L 5 6 min Knee ext 5# 2 sets 10 HC curl 15# 2 sets 10 Black tband trunk flex and ext 2 sets 10 Seated row 20# 2 sets 10 Lat pull 20# 2 sets 10 20# resisted gait 4 x 4 way - MIn A d/t several stumbles STS on airex 10 x 2 sets- decreased initial standing with posterior lean and decreased eccentric lowering Ball toss on airex Side stepping over airex Alt step tap 6 inch 20 x 2 sets   09/14/22 Bike level 3 x 6 minutes Gait around the building 1 lap Feet on ball K2C, trunk rotation, small bridge, isometric abs Leg extension 5# 2x10 Leg curls 15# 2x10 Volley ball Walking ball toss Ball kicks Nustep level 5 x 5 minutes  PATIENT EDUCATION: Education details: POC Person educated: Patient and Child(ren) Education method: Explanation Education comprehension: verbalized understanding   HOME EXERCISE PROGRAM: Advised to walk 10 - 20 minutes a day Access Code: [email or web address] URL: [email or web address] Date: 12/05/2022 Prepared by: MicLum Babexercises - Supine Lower Trunk Rotation  - 2 x daily - 7 x weekly - 1 sets - 10 reps - 10 hold - Hooklying Single Knee to Chest Stretch  - 2 x daily - 7 x weekly - 1 sets - 10 reps - 10 hold - Supine Double Knee to Chest  - 2 x daily - 7 x weekly - 1 sets - 10 reps - 10 hold   GOALS: Goals reviewed with patient? Yes  SHORT TERM GOALS: Target date:  09/18/22  Independent with initial HEP  Goal status:met   LONG TERM GOALS: Target date: 11/27/22  Independent with advanced HEP Goal status: progressing    2.  Decrease TUG time to 12 seconds Goal status: regressed and was at 42 seconds on 12/05/22  3.  Increase BERG balance test score to 46/56 Goal status:ongoing 10/17/22 45  4.  Increase right LE strength to 4-/5 Goal status:progressing regressed to 3+ 12/05/22  5.  Get up from sitting without using hands Goal status regressed has to have hands to get up  ASSESSMENT:  CLINICAL IMPRESSION:  Patient has not been in to PT since prior to Thanksgiving, she was moving into a new house and was having car trouble, she comes in today much worse than what we observed and measured in November, she is having significant LBP, has a history of 2 lumbar surgeries with laminectomy and discectomy.  She is weaker and having more difficulty getting up from sitting and with walking, using a walker at home most times, today she is using a cane  OBJECTIVE IMPAIRMENTS Abnormal gait, decreased activity tolerance, decreased balance, decreased coordination, decreased endurance, decreased mobility, difficulty walking, decreased ROM, decreased strength, increased muscle spasms, impaired flexibility, improper body mechanics, postural dysfunction, and pain.   REHAB POTENTIAL: Good  CLINICAL DECISION MAKING: Stable/uncomplicated  EVALUATION COMPLEXITY: Low  PLAN: PT FREQUENCY: 1-2x/week  PT DURATION: 12 weeks  PLANNED INTERVENTIONS: Therapeutic exercises, Therapeutic activity, Neuromuscular re-education, Balance training, Gait training, Patient/Family education, Self Care, Joint mobilization, Stair training, Electrical stimulation, Spinal mobilization, Cryotherapy, Moist heat, and Manual therapy  PLAN FOR NEXT SESSION: will work on strength and function and help back pain   Shawnte Demarest W, PT 12/05/2022, 10:45 AM

## 2022-12-06 NOTE — Telephone Encounter (Signed)
Refill request Tramadol Last refill 10/29/22 #30 Last office visit 04/26/22 Upcoming appointment 01/10/23

## 2022-12-06 NOTE — Telephone Encounter (Signed)
ERx 

## 2022-12-07 IMAGING — MR MR HEAD WO/W CM
4 of 5 series · 19 of 48 positions shown · IV contrast (7 G)
Comparison: CT head without contrast/[DATE]

CLINICAL DATA: Parkinson's disease. Preop for deep brain stimulator
placement.

EXAM:
MRI HEAD WITHOUT AND WITH CONTRAST
TECHNIQUE: Multiplanar, multiecho pulse sequences of the brain and surrounding
structures were obtained without and with intravenous contrast.
CONTRAST:  7mL GADAVIST GADOBUTROL 1 MMOL/ML IV SOLN

[Series 2: PD · axial · 1.0mm · 0.51mm/px · z∈[-9,+145]mm · 9 of 157 slices shown]
[im 1/157]
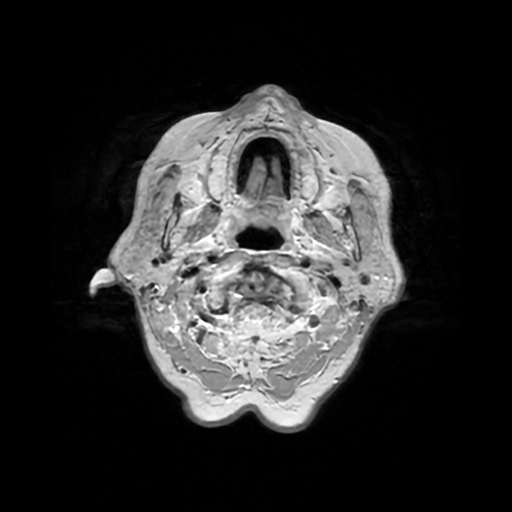
[im 20/157]
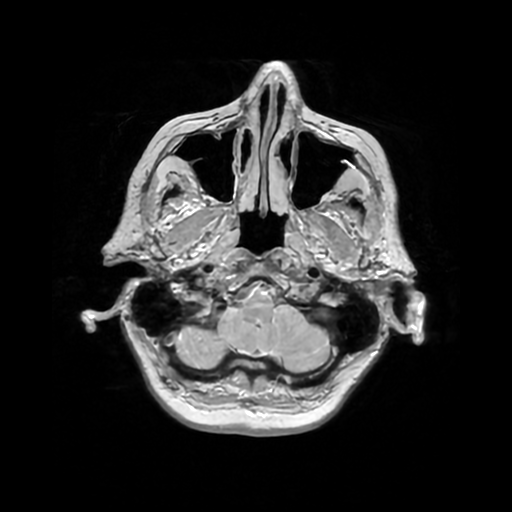
[im 40/157]
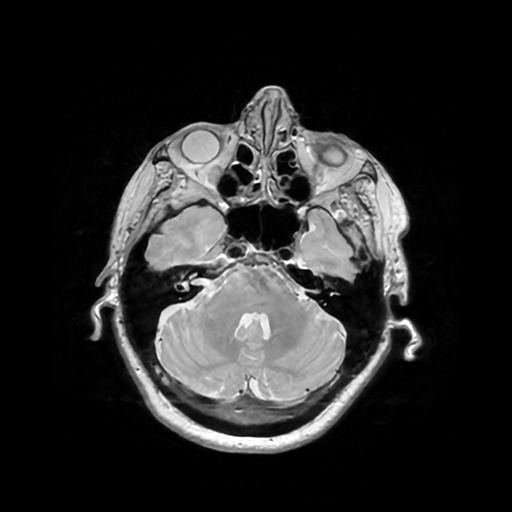
[im 59/157]
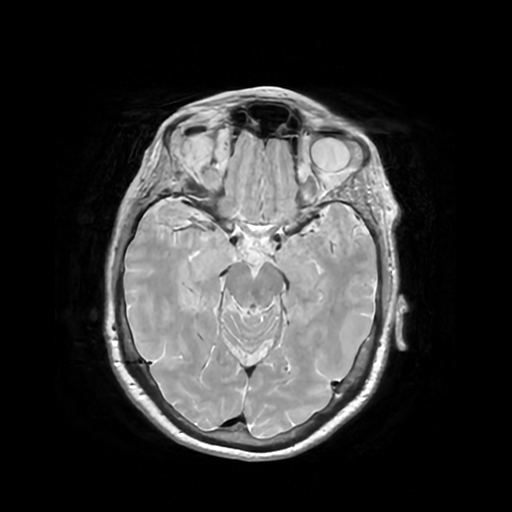
[im 79/157]
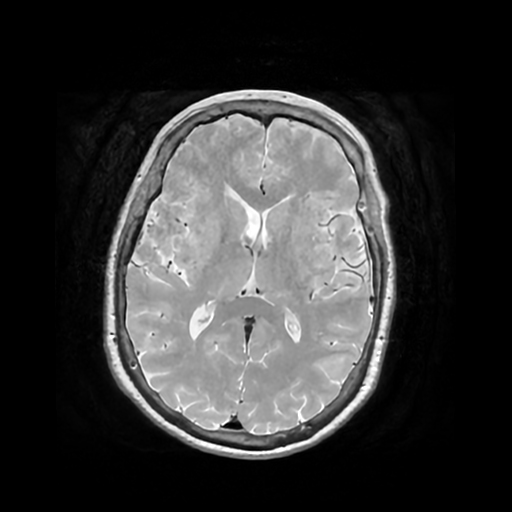
[im 98/157]
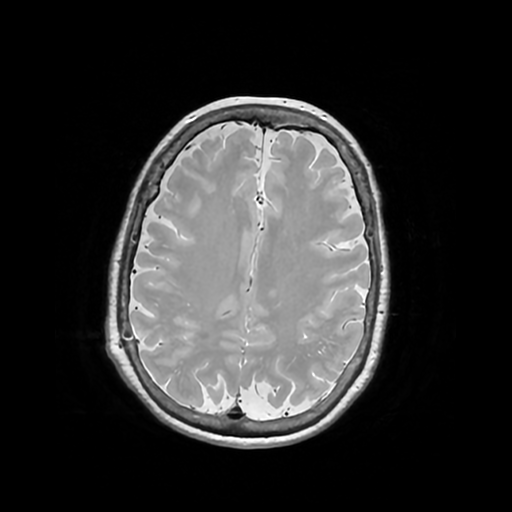
[im 118/157]
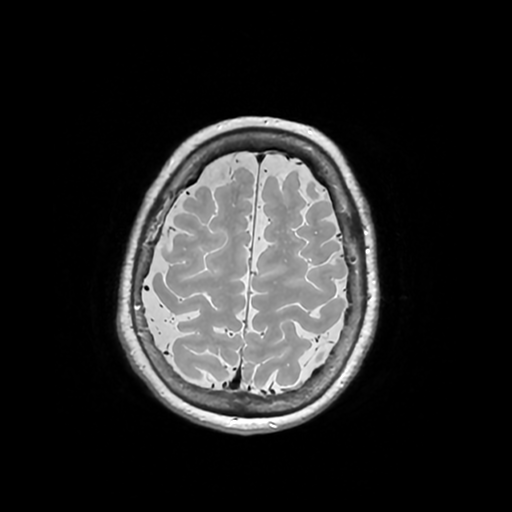
[im 137/157]
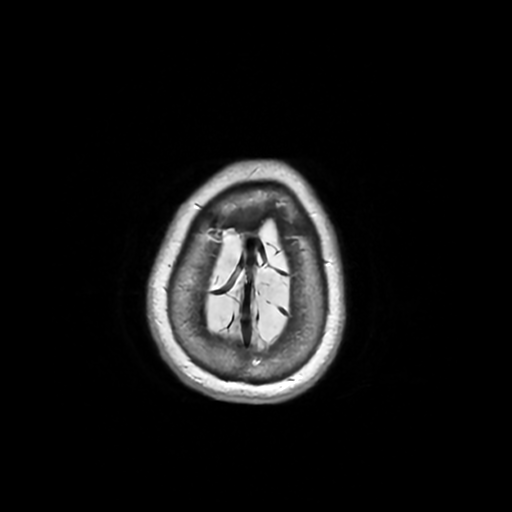
[im 157/157]
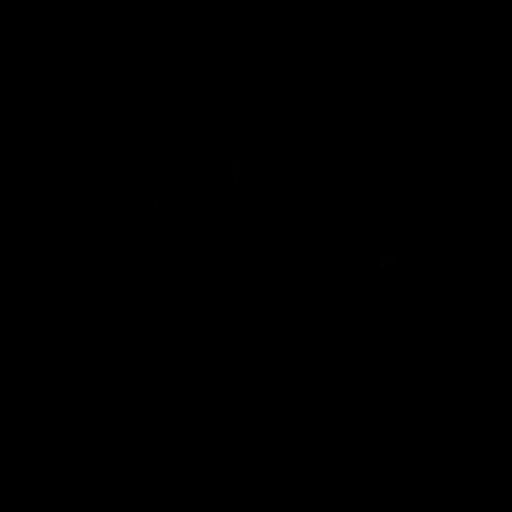

[Series 3: FLAIR · axial · 5.0mm · 0.45mm/px · 1 of 23 slices shown]
[im 1/23]
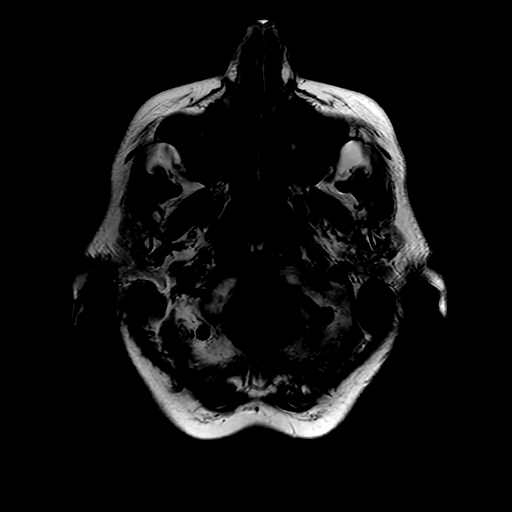

[Series 400: multiplanar reconstruction (mpr) · coronal · 0.9mm · 0.47mm/px · 6 of 255 slices shown (1 of 2)]
[im 1/255]
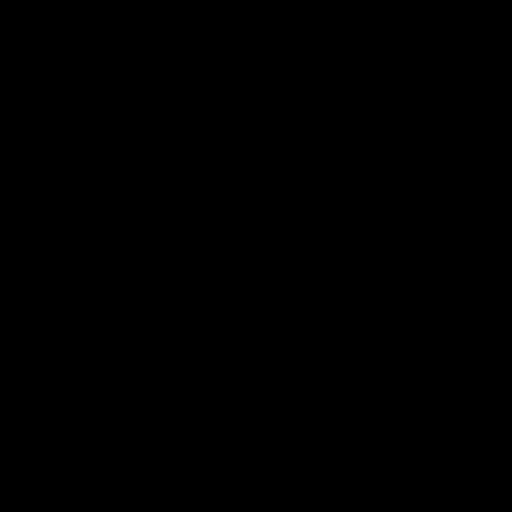
[im 37/255]
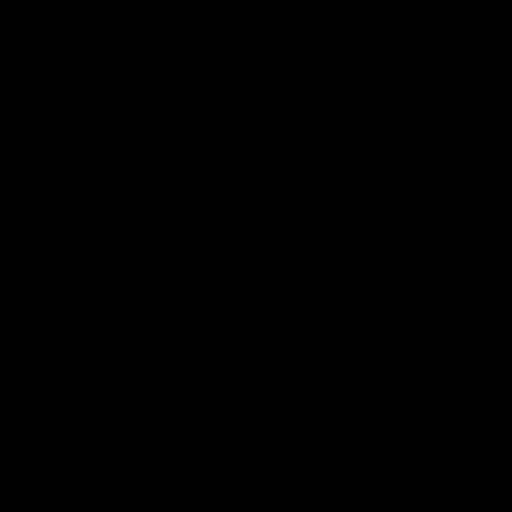
[im 73/255]
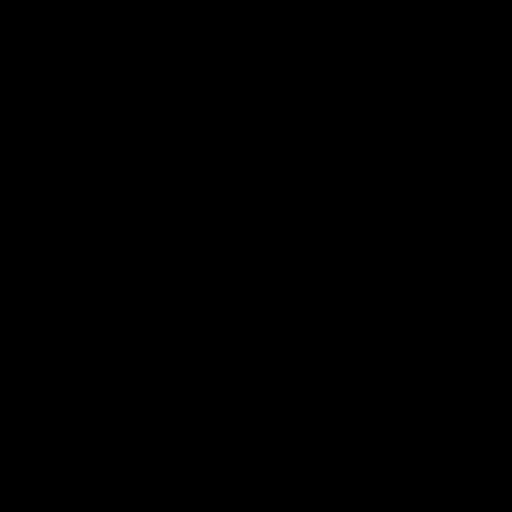
[im 109/255]
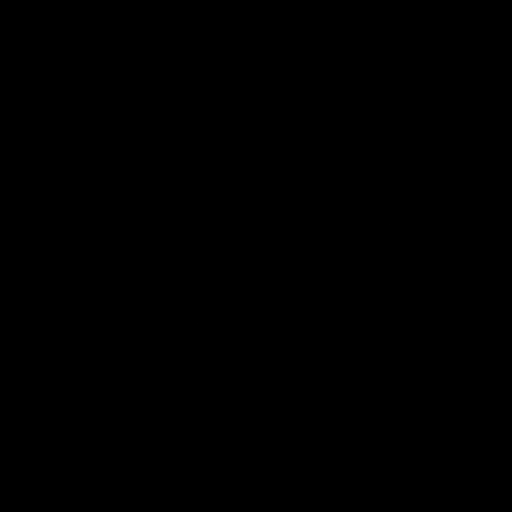
[im 128/255]
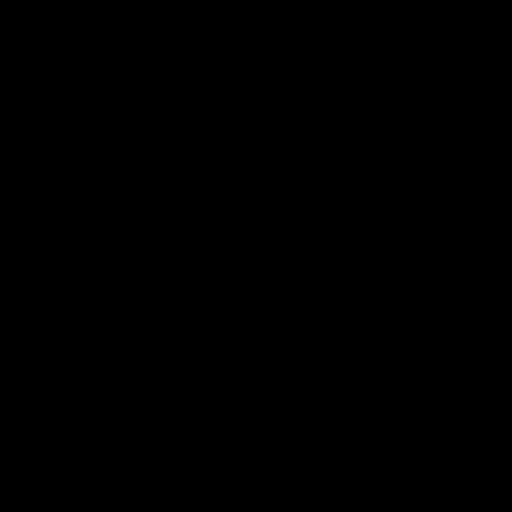
[im 218/255]
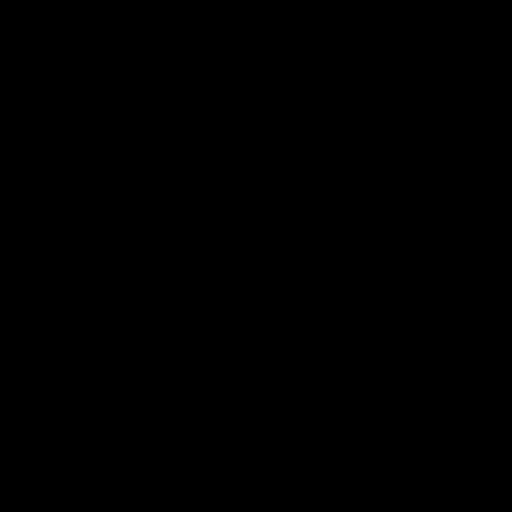

[Series 401: multiplanar reconstruction (mpr) · axial · 1.0mm · 0.47mm/px · z∈[-61,+106]mm · 3 of 241 slices shown (2 of 2)]
[im 37/241]
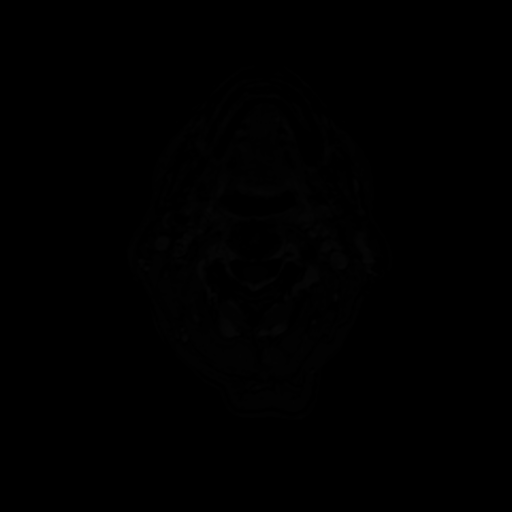
[im 130/241]
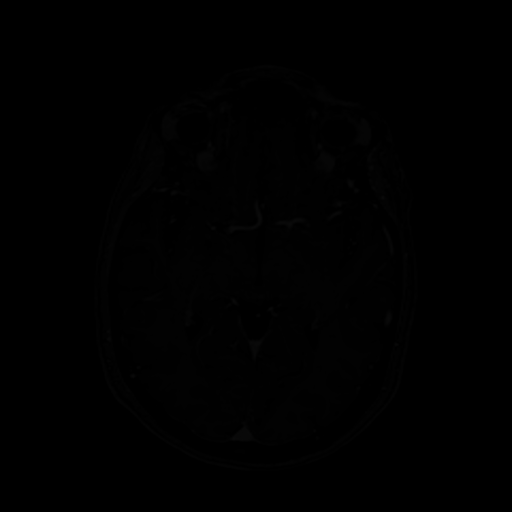
[im 204/241]
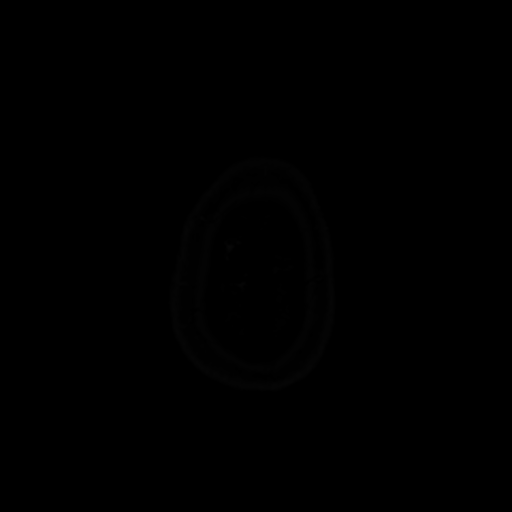

[19 of 48 positions shown; findings below may reference images not displayed]

FINDINGS: Brain: Brainstem and cerebral peduncles within normal limits. Mild
periventricular T2 hyperintensities are most evident in the
occipital and parietal lobes bilaterally. No acute abnormality is
present. Ventricles are of normal size. No significant extra-axial
fluid collection is present.

The postcontrast images demonstrate no pathologic enhancement.

The internal auditory canals are within normal limits. The brainstem
and cerebellum are within normal limits.

Vascular: Flow is present in the major intracranial arteries.

Skull and upper cervical spine: The craniocervical junction is
normal. Upper cervical spine is within normal limits. Marrow signal
is unremarkable.

Sinuses/Orbits: The paranasal sinuses and mastoid air cells are
clear. Bilateral lens replacements are noted. Globes and orbits are
otherwise unremarkable.
IMPRESSION: 1. No acute abnormality.
2. Anatomy for surgical planning purposes.
3. Mild periventricular T2 hyperintensities bilaterally are most
evident in the occipital and parietal lobes bilaterally. The finding
is nonspecific but can be seen in the setting of chronic
microvascular ischemia, a demyelinating process such as multiple
sclerosis, vasculitis, complicated migraine headaches, or as the
sequelae of a prior infectious or inflammatory process.

## 2022-12-11 NOTE — Therapy (Signed)
OUTPATIENT PHYSICAL THERAPY NEURO   Patient Name: [name] MRN: [id number] DOB:Apr 21, 1946, 77 y.o., female Today's Date: 12/12/2022   PCP: Leary Roca PROVIDER: [name]   PT End of Session - 12/12/22 1143     Visit Number 10    Date for PT Re-Evaluation 01/05/23    Authorization Type [employer] Medicare    PT Start Time 2025    PT Stop Time 1230    PT Time Calculation (min) 45 min    Activity Tolerance Patient tolerated treatment well    Behavior During Therapy [facility] for tasks assessed/performed              Past Medical History:  Diagnosis Date   Allergy    ANEMIA-NOS 09/25/2007   Anxiety    Arthritis    B12 DEFICIENCY 05/03/2007   Blood transfusion without reported diagnosis    CAD (coronary artery disease) 01/2010   MI, Nishan   Cardiomyopathy ([place]) 02/08/2010   H/o this 2012 after urosepsis, no recurrence.    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   OSTEOPOROSIS 08/2009   bisphosphonate on hold 2/2 dysphagia, on reclast done in August each year   Parkinson's disease 08/25/2015   Dx Dr [name] 07/2015    Pneumonia    PONV (postoperative nausea and vomiting)    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   Takotsubo cardiomyopathy 2008   due to E coli urosepsis   Past Surgical History:  Procedure Laterality Date   ABDOMINAL HYSTERECTOMY  1970s   IUD infection - first partial then with oophorectomy (cysts), complication -  low blood pressure   CATARACT EXTRACTION Bilateral    CHOLECYSTECTOMY     complication - low blood pressure   COLONOSCOPY  06/2008   h/o polyps but latest WNL, rec rpt 10 yrs Olevia Perches)   COLONOSCOPY  11/2018   multiple TAs (10 polyps total), rpt 2 yrs Fuller Plan)   COLONOSCOPY  04/2021   multiple TAs, rpt 3 yrs Fuller Plan)   CYSTOSCOPY  12/2013   abx treatment for recurrent cystitis   DEXA  04/2013   T -2.9 @ femur, -1.6 @ spine   DEXA  04/2017   T -2.9 hip, -0.7 spine   ESOPHAGOGASTRODUODENOSCOPY  12/2017   WNL, regardless esophagus dilated, small HH Fuller Plan)   EYE SURGERY     LUMBAR LAMINECTOMY/DECOMPRESSION MICRODISCECTOMY Right 11/27/2019   Right Lumbar Four-Five foraminotomy;  Erline Levine, MD)   LUMBAR LAMINECTOMY/DECOMPRESSION MICRODISCECTOMY Right 05/16/2022   Procedure: OPEN LUMBAR LAMINECTOMY, RT, L45 W/LATERAL RECESS DECOMPRESSION;  Surgeon: Karsten Ro, DO;  Location: [place];  Service: Neurosurgery;  Laterality: Right;  3C   MINOR PLACEMENT OF FIDUCIAL N/A 06/30/2021   Procedure: MINOR PLACEMENT OF FIDUCIAL;  Surgeon: Erline Levine, MD;  Location: [place];  Service: Neurosurgery;  Laterality: N/A;  Minor room   PTOSIS REPAIR Bilateral 10/2020   Plastic  Surgery   PULSE GENERATOR IMPLANT Right 07/14/2021   Procedure: UNILATERAL PULSE GENERATOR IMPLANT;  Surgeon: Erline Levine, MD;  Location: [place];  Service: Neurosurgery;  Laterality: Right;   SUBTHALAMIC STIMULATOR INSERTION Bilateral 07/07/2021   Procedure: Deep brain stimulator placement;  Surgeon: Erline Levine, MD;  Location: [place];  Service: Neurosurgery;  Laterality: Bilateral;   TUBAL LIGATION     UPPER GASTROINTESTINAL ENDOSCOPY     Patient Active Problem List   Diagnosis Date Noted   Lumbar radiculopathy, chronic 05/16/2022   Pre-op evaluation 04/26/2022   CAP (community acquired pneumonia) 03/29/2022   Localized swelling of chest wall 12/23/2021   Other fatigue 11/25/2021   Sinus pain 11/25/2021   Lymphadenopathy  11/25/2021   Parkinson's disease with dyskinesia 05/28/2021   Closed fracture of rib of right side with routine healing 05/28/2021   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   Left shoulder pain 06/19/2020   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   Degenerative lumbar spinal stenosis 11/27/2019   Knee pain, bilateral 08/06/2018   Constipation 08/06/2018   Right lumbar radiculopathy 04/10/2018   Dysphagia 10/09/2017   Chronic cough 07/16/2017   GAD (generalized anxiety disorder) 04/28/2016   Chronic insomnia 03/07/2016   Left Achilles tendinitis 12/08/2015   Parkinson's disease 08/25/2015   Advanced care planning/counseling discussion 05/26/2015   Family circumstance 02/23/2015   Encounter for general adult medical examination with abnormal findings 05/21/2014   MDD (major depressive disorder), single episode, moderate ([place]) 11/12/2013   DDD (degenerative disc disease), lumbar 05/31/2013   Medicare annual wellness visit, subsequent 05/20/2013   HLD (hyperlipidemia) 05/20/2013   Vitamin D deficiency 05/09/2013   Recurrent UTI 01/03/2011   Rash and other nonspecific skin eruption 05/11/2010   GERD 02/22/2010   CAD (coronary artery disease) 01/25/2010   Osteoporosis 11/10/2009   Orthostatic hypotension 12/06/2008   Anemia 09/25/2007   Vitamin B12 deficiency 05/03/2007   Fibromyalgia 05/03/2007    ONSET DATE: 06/28/2022  REFERRING DIAG: Parkinson's, S/P lumbar surgery  THERAPY DIAG:  Parkinson's disease with dyskinesia, unspecified whether manifestations fluctuate  Difficulty in walking, not elsewhere classified  Muscle weakness (generalized)  Other low back pain  Abnormal posture  Unsteadiness on feet  Rationale for Evaluation and Treatment Rehabilitation  SUBJECTIVE:                                                                                                                                                                                               SUBJECTIVE STATEMENT:    Better than I was last week,  no new falls. My back still hurts but I don't know if that was from the fall.   Pt accompanied by: self  PERTINENT HISTORY: see above, did have lumbar surgery in June, has a deep brain stimulator for movement disorder  PAIN:  Are you having pain? Yes, pain a 6/10 in the L/S area Takes pain med, Tylenol to help, recently the best it has been is a 6/10 reports that she has had to use the walker recently, standing and getting up from sitting will increase the pain  PRECAUTIONS: None  WEIGHT BEARING RESTRICTIONS No  FALLS: Has patient fallen in last 6 months? No  LIVING ENVIRONMENT: Lives with: lives with their family Lives in: House/apartment Stairs: Yes: Internal: 12 steps; can reach both Has following equipment at home: Single point cane and Walker - 4 wheeled  PLOF: Independent, does some housework  PATIENT GOALS:  be stronger, walk longer and better  OBJECTIVE:   COGNITION: Overall cognitive status: Within functional limits for tasks assessed   SENSATION: WFL Palpation she is very tender and tight in the lumbar area  POSTURE: rounded shoulders, forward head, and decreased lumbar lordosis  LOWER EXTREMITY ROM:    Motions are WFL's but she has poor control of the right LE, weakness and dyskinesia  LOWER EXTREMITY MMT:    MMT Right Eval Left Eval Right and left 12/05/22  Hip flexion 3+ 4 3+  Hip extension 4- 4 3+  Hip abduction 4- 4 3+  Hip adduction 4- 4 3+  Hip internal rotation     Hip external rotation     Knee flexion 3+ 4 3+  Knee extension 3+ 4 3+  Ankle dorsiflexion 4- 4- 4-  Ankle plantarflexion     Ankle inversion     Ankle eversion 3+ 4-   (Blank rows = not tested)  TRANSFERS:  Has to use hands, if not falls back into chair  GAIT: Gait pattern: has extraneous motions of the right arm and the right leg and the shoulders  and head, off balance at times but typically corrects on own, right LE tends to circumduct and has decreased control of the knee and the ankle Distance walked: 400 feet Assistive device utilized: None Level of assistance: Complete Independence Comments: on stairs does one at a time  FUNCTIONAL TESTs:  5 times sit to stand: 21 seconds  10/17/22 13.38 sec but decreased eccentric conrtrol.  12/05/22 = 55 seconds with back pain and definite need for hands Timed up and go (TUG): 17 seconds  10/17/22 9.2 sec without AD, 1,9,24 with SPC = 42 secondsa BERG balance 39/56  10/17/22 45/56  TODAY'S TREATMENT:  12/12/22 NuStep L5 x16mns  STS 2x10 Feet on ball K2C, trunk rotation, small bridge x10 Leg extension 5# 2x10 Leg curls 15# 2x10 Box taps 6"  Volleyball hits sitting and then standing  Standing rows and ext red 2x10    12/05/22 TUG with SPC 42 seconds 5XSTS 55 seconds needing to use hands and struggling Issued HEP Talked about body mechanics for up and down from sitting and lying down as well as bed mobility MHP/IFC to the low back in sitting   10/17/22 Nustep L 5 6 min Func testing performed 5 times sit to stand: 21 seconds  10/17/22 13.38 sec but decreased eccentric conrtrol Timed up and go (TUG): 17 seconds  10/17/22 9.2 sec without AD BERG balance 39/56  10/17/22 45/56 UBE L 3 2 min fwd/2 min back Resisted gait 4 ways 4  x 30# CG-min A HS curls 25# 2 sets 10 Knee ext 15# 2 sets 10 Side stepping in and out of 6 inch box on foam pads 6 inch alt box taps on foam mat 20 x 2 set s-min A    10/03/22 Nustep L 5 60mn Bike L 4 628m UBE L 3 [id number] gait 4 ways 4 x 30# Black bar 20x heel raise/toe raise 6 inch step up 10 x each leg from airex 6 inch step up and over- min A with 2 LOB requiring assistance to prevent fall STS on airex with wt ball toss 2 sets 5- good righting reaction but LE weakness noted Tandem gait fwd and back on foam beam in // bars 5 x- occasional UE  use Side stepping foam beam // bars 5 x-occasional UE use HS curls 25# 2 sets 10 Knee ext 15# 2 sets 10  09/26/22 Nustep level 5 x 6 minutes Bike level 4 x 6 minutes UBE level 3 x 6 minutes Walking ball toss On airex ball toss Volleyball Direction changes, speed changes  09/21/22 Walk around the building in the back good pace, some extra motions are more pronounced this AM Nustep level 5 x 6 minutes UBE level 3 x 5 minutes 5# straight arm pulls Leg press 20#  2x10 Red tband DF/PF Volleyball On airex cone toe touches Side stepping back wards walk, direction changes Walking ball toss fwd and backward  09/19/22  Nustep L 5 6 min Knee ext 5# 2 sets 10 HC curl 15# 2 sets 10 Black tband trunk flex and ext 2 sets 10 Seated row 20# 2 sets 10 Lat pull 20# 2 sets 10 20# resisted gait 4 x 4 way - MIn A d/t several stumbles STS on airex 10 x 2 sets- decreased initial standing with posterior lean and decreased eccentric lowering Ball toss on airex Side stepping over airex Alt step tap 6 inch 20 x 2 sets   09/14/22 Bike level 3 x 6 minutes Gait around the building 1 lap Feet on ball K2C, trunk rotation, small bridge, isometric abs Leg extension 5# 2x10 Leg curls 15# 2x10 Volley ball Walking ball toss Ball kicks Nustep level 5 x 5 minutes  PATIENT EDUCATION: Education details: POC Person educated: Patient and Child(ren) Education method: Explanation Education comprehension: verbalized understanding   HOME EXERCISE PROGRAM: Advised to walk 10 - 20 minutes a day Access Code: [email or web address] URL: [email or web address] Date: 12/05/2022 Prepared by: MicLum Babexercises - Supine Lower Trunk Rotation  - 2 x daily - 7 x weekly - 1 sets - 10 reps - 10 hold - Hooklying Single Knee to Chest Stretch  - 2 x daily - 7 x weekly - 1 sets - 10 reps - 10 hold - Supine Double Knee to Chest  - 2 x daily - 7 x weekly - 1 sets - 10 reps - 10 hold   GOALS: Goals  reviewed with patient? Yes  SHORT TERM GOALS: Target date: 09/18/22  Independent with initial HEP  Goal status:met   LONG TERM GOALS: Target date: 11/27/22  Independent with advanced HEP Goal status: progressing    2.  Decrease TUG time to 12 seconds Goal status: regressed and was at 42 seconds on 12/05/22  3.  Increase BERG balance test score to 46/56 Goal status:ongoing 10/17/22 45  4.  Increase right LE strength to 4-/5 Goal status:progressing regressed to 3+ 12/05/22  5.  Get up from sitting without using hands Goal status  regressed has to have hands to get up  ASSESSMENT:  CLINICAL IMPRESSION:   Patient reports she is doing much better than she was last week. Her pain has decreased and she states she is able to do things she could not last week such as sit to stands. Does well with volleyball hits standing, shows good reaction time and able to shoe good stepping strategy. Loss of balance with box taps on 6", maxA needed to prevent her from falling on floor. One instance of toe catching and almost tripping during session when walking around in clinic.   OBJECTIVE IMPAIRMENTS Abnormal gait, decreased activity tolerance, decreased balance, decreased coordination, decreased endurance, decreased mobility, difficulty walking, decreased ROM, decreased strength, increased muscle spasms, impaired flexibility, improper body mechanics, postural dysfunction, and pain.   REHAB POTENTIAL: Good  CLINICAL DECISION MAKING: Stable/uncomplicated  EVALUATION COMPLEXITY: Low  PLAN: PT FREQUENCY: 1-2x/week  PT DURATION: 12 weeks  PLANNED INTERVENTIONS: Therapeutic exercises, Therapeutic activity, Neuromuscular re-education, Balance training, Gait training, Patient/Family education, Self Care, Joint mobilization, Stair training, Electrical stimulation, Spinal mobilization, Cryotherapy, Moist heat, and Manual therapy  PLAN FOR NEXT SESSION: will work on strength and function and help back  pain   [employer], PT 12/12/2022, 12:27 PM

## 2022-12-12 ENCOUNTER — Ambulatory Visit: Payer: Medicare Other

## 2022-12-12 ENCOUNTER — Ambulatory Visit: Payer: Medicare Other | Admitting: Speech Pathology

## 2022-12-12 ENCOUNTER — Encounter: Payer: Self-pay | Admitting: Speech Pathology

## 2022-12-12 DIAGNOSIS — R2681 Unsteadiness on feet: Secondary | ICD-10-CM | POA: Diagnosis not present

## 2022-12-12 DIAGNOSIS — M6281 Muscle weakness (generalized): Secondary | ICD-10-CM

## 2022-12-12 DIAGNOSIS — M5459 Other low back pain: Secondary | ICD-10-CM | POA: Diagnosis not present

## 2022-12-12 DIAGNOSIS — R293 Abnormal posture: Secondary | ICD-10-CM

## 2022-12-12 DIAGNOSIS — G20B1 Parkinson's disease with dyskinesia, without mention of fluctuations: Secondary | ICD-10-CM

## 2022-12-12 DIAGNOSIS — R262 Difficulty in walking, not elsewhere classified: Secondary | ICD-10-CM | POA: Diagnosis not present

## 2022-12-12 DIAGNOSIS — R471 Dysarthria and anarthria: Secondary | ICD-10-CM | POA: Diagnosis not present

## 2022-12-12 NOTE — Therapy (Signed)
OUTPATIENT SPEECH LANGUAGE PATHOLOGY TREATMENT & RECERTIFICATION   Patient Name: [name] MRN: [id number] DOB:[birth year], 77 y.o., female Today's Date: 12/06/2022  PCP: [name], MD REFERRING PROVIDER: Alonza Bogus, DO   End of Session - 12/06/22 0902     Visit Number 5    Number of Visits --   Error 15 to 25 sessions   Date for SLP Re-Evaluation 02/04/23    Authorization - Visit Number 1    Authorization - Number of Visits 33    SLP Start Time 1100    SLP Stop Time  1140    SLP Time Calculation (min) 40 min    Activity Tolerance Patient tolerated treatment well             Past Medical History:  Diagnosis Date   Allergy    ANEMIA-NOS 09/25/2007   Anxiety    Arthritis    B12 DEFICIENCY 05/03/2007   Blood transfusion without reported diagnosis    CAD (coronary artery disease) 01/2010   MI, Nishan   Cardiomyopathy ([place]) 02/08/2010   H/o this 2012 after urosepsis, no recurrence.    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   OSTEOPOROSIS 08/2009   bisphosphonate on hold 2/2 dysphagia, on reclast done in August each year   Parkinson's disease 08/25/2015   Dx Dr [name] 07/2015    Pneumonia    PONV (postoperative nausea and vomiting)    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   Takotsubo cardiomyopathy 2008   due to E coli urosepsis   Past Surgical History:  Procedure Laterality Date   ABDOMINAL HYSTERECTOMY  1970s    IUD infection - first partial then with oophorectomy (cysts), complication - low blood pressure   CATARACT EXTRACTION Bilateral    CHOLECYSTECTOMY     complication - low blood pressure   COLONOSCOPY  06/2008   h/o polyps but latest WNL, rec rpt 10 yrs Olevia Perches)   COLONOSCOPY  11/2018   multiple TAs (10 polyps total), rpt 2 yrs Fuller Plan)   COLONOSCOPY  04/2021   multiple TAs, rpt 3 yrs Fuller Plan)   CYSTOSCOPY  12/2013   abx treatment for recurrent cystitis   DEXA  04/2013   T -2.9 @ femur, -1.6 @ spine   DEXA  04/2017   T -2.9 hip, -0.7 spine   ESOPHAGOGASTRODUODENOSCOPY  12/2017   WNL, regardless esophagus dilated, small HH Fuller Plan)   EYE SURGERY     LUMBAR LAMINECTOMY/DECOMPRESSION MICRODISCECTOMY Right 11/27/2019   Right Lumbar Four-Five foraminotomy;  Erline Levine, MD)   LUMBAR LAMINECTOMY/DECOMPRESSION MICRODISCECTOMY Right 05/16/2022   Procedure: OPEN LUMBAR LAMINECTOMY, RT, L45 W/LATERAL RECESS DECOMPRESSION;  Surgeon: Karsten Ro, DO;  Location: [place];  Service: Neurosurgery;  Laterality: Right;  3C   MINOR PLACEMENT OF FIDUCIAL N/A 06/30/2021   Procedure: MINOR PLACEMENT OF FIDUCIAL;  Surgeon: Erline Levine, MD;  Location: [place];  Service: Neurosurgery;  Laterality: N/A;  Minor room   PTOSIS REPAIR Bilateral 10/2020   Plastic Surgery   PULSE GENERATOR IMPLANT Right 07/14/2021   Procedure: UNILATERAL PULSE GENERATOR IMPLANT;  Surgeon: Erline Levine, MD;  Location: [place];  Service: Neurosurgery;  Laterality: Right;   SUBTHALAMIC STIMULATOR INSERTION Bilateral 07/07/2021   Procedure: Deep brain stimulator placement;  Surgeon: Erline Levine, MD;  Location: [place];  Service: Neurosurgery;  Laterality: Bilateral;   TUBAL LIGATION     UPPER GASTROINTESTINAL ENDOSCOPY     Patient Active Problem List   Diagnosis Date Noted   Lumbar radiculopathy, chronic 05/16/2022   Pre-op evaluation 04/26/2022   CAP (community acquired pneumonia) 03/29/2022   Localized swelling of chest wall  12/23/2021   Other fatigue 11/25/2021   Sinus pain 11/25/2021   Lymphadenopathy 11/25/2021   Parkinson's disease with dyskinesia 05/28/2021   Closed fracture of rib of right side with routine healing 05/28/2021   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   Left shoulder pain 06/19/2020   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   Degenerative lumbar spinal stenosis 11/27/2019   Knee pain, bilateral 08/06/2018   Constipation 08/06/2018   Right lumbar radiculopathy 04/10/2018   Dysphagia 10/09/2017   Chronic cough 07/16/2017   GAD (generalized anxiety disorder) 04/28/2016   Chronic insomnia 03/07/2016   Left Achilles tendinitis 12/08/2015   Parkinson's disease 08/25/2015   Advanced care planning/counseling discussion 05/26/2015   Family circumstance 02/23/2015   Encounter for general adult medical examination with abnormal findings 05/21/2014   MDD (major depressive disorder), single episode, moderate ([place]) 11/12/2013   DDD (degenerative disc disease), lumbar 05/31/2013   Medicare annual wellness visit, subsequent 05/20/2013   HLD (hyperlipidemia) 05/20/2013   Vitamin D deficiency 05/09/2013   Recurrent UTI 01/03/2011   Rash and other nonspecific skin eruption 05/11/2010   GERD 02/22/2010   CAD (coronary artery disease) 01/25/2010   Osteoporosis 11/10/2009   Orthostatic hypotension 12/06/2008   Anemia 09/25/2007   Vitamin B12 deficiency 05/03/2007   Fibromyalgia 05/03/2007    ONSET DATE: Script written September 2023  REFERRING DIAG: G20 (ICD-10-CM) - Parkinson's disease   THERAPY DIAG:  Dysarthria and anarthria  Rationale for Evaluation and Treatment Rehabilitation  SUBJECTIVE:   SUBJECTIVE STATEMENT: "I lost my homework." Pt accompanied by: self  PERTINENT HISTORY: PMHx significant for Parkinsons disease w/ DBS 06/2021, Hx of orthostatic hypotension, anxiety/depression, RUE/RLE  reflex sympathetic dystrophy, osteoporosis, lupus, fibromyalgia, CHF and OA. Lumbar sx June 2023. Pt with evaluation for voice/dysarthria in Oct 2022 but did not return after eval due to medical reason.  PAIN:  Are you having pain?  Yes 6/10  Lower back  Tramadol   LIVING ENVIRONMENT: Lives with: lives with their family (son) Lives in: House/apartment  PLOF:  Level of assistance: Independent with ADLs, Comment: does most housework Employment: Retired  PATIENT GOALS: Talk louder  OBJECTIVE:   DIAGNOSTIC FINDINGS:  Clinical Impression - MBSS   05-10-22             Patient presents with a very mild oropharyngeal dysphagia with primary impairments being delays in anterior to posterior transit of solid texture boluses in oral cavity and mildly delayed pharyngeal peristalsis of puree and mechanical soft solids. She did exhibit mildly delayed swallow initiation of mechanical soft solids at level of vallecular sinus and trace residuals above UES opening. No other pharyngeal residuals observed with any of the tested consistencies (thin liquids,  puree solids, mechanical soft solids, 1/2 barium tablet. No penetration or aspiration observed with liquids or solids. Esophageal sweep did not reveal significant amount of barium stasis and esophageal transit appeared WFL. (this is an improvement as compared to 2018 MBS). Patient would cough after most swallows in absence of any observed penetration or aspiration. She did also have a cough prior to any PO's being given and when asked most recent time she ate, she reported that she has not had anything to eat or drink since last night. SLP reviewed MBS from 2018 which reports "no airway compromise despite frequent coughing immediately post-swallow". Unfortunately, results of this MBS do not explain patient's frequent coughing that occurs immediately after swallow PO's. Swallow Evaluation Recommendations  SLP Diet Recommendations: Regular solids;Thin liquid   Liquid Administration via: Straw;Cup  Medication Administration: Whole meds with liquid  Compensations: Minimize environmental distractions;Slow rate;Small sips/bites   PATIENT REPORTED OUTCOME MEASURES (PROM): Communication Effectiveness Survey: to be completed in first 1-2 sessions   TODAY'S TREATMENT:   12/12/22: Pt reported she worked on [employer] for breathing and loud /a/ 1-2x day for 7 days. Pt was subjectively louder upon entry to room today. She reports sometimes that when people call, they cannot hear her. Finished education on exercises for voice.   /ah/: averaged 88-90 dB with cueing for breath support and sequencing  Required max cueing for diaphragmatic breathing Low to high glides were easier than high to low glides. Could not put both together. Pt was more successful when stepping the scale.   Continues to demonstrate tension in neck and shoulders. SLP demonstrated use of a mirror for visual feedback.   12/05/22: Pt was seen for the first time since November. She reports that she has been busy with a move and had a fall. She reported she lost her functional phrases. SLP suggested making new phrases this session. 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 Began edu on homework (diaphragmatic breathing and loud /a/). Pt required max cueing for abdominal breathing.   Good morning! 2. Are you ready for school? 3. Have a good day!  4. I love you 5. Have you got any laundry?  6. Are we going anywhere today?  7. Have you eaten?  8. Does anybody have a doctor's appointment today? 9. How was your day?  10.What's for dinner?  Coversational loudness: 59 dB  Initial "hey": 65 dB With cueing mid 70s dB  10/17/22: [name] has done loud "hey"s at home 5/14 days since last session. SLP told pt that it was imperative she complete loud "hey" x10, BID. Pt going on hold today until January 2024 due to moving into new house and the holidays.  She forgot her 10 everyday sentences today  again - SLP told pt to bring them next session and then we can copy them and leave them here. Loud "hey" today at low-mid 80s with initial cue for loudness. In conversation pt's average was mid-upper 60s dB, but pt had rare rushes of speech which decr'd overall intelligibility. In paragraph reading, pt averaged low 70s dB, but again demonstrated rushes of speech. In addition SLP encouraged pt to overarticulate when reading to minimize impact of rushes of speech. Pt intelligibility improved when she did both of these, to 95-100%.   10/03/22: SLP had pt start with "hey"s and produced mid-upper 70s dB x2. SLP provided pt a model x3 and she incr'd productions to low-mid 80s dB. She req'd usual mod cues for loudness and usual mod-max cues  for breath support prior to each "hey!". Pt has done loud "hey" at least once/day, but has not read out loud for 30 seconds. SLP told pt to perform "hey" 10 reps BID, and read out loud 30 seconds x4 reps. Pt's "everyday sentences" had 6/8 that were really said everyday. SLP hjad pt read these with LOUD voice and averaged upper 70s dB. She req'd usual mod A for loudness faded to occasional min A, and occasional min-mod A for breath support prior to each sentence. In reading paragraphs, [name] averaged upper 60s-low 70s dB and said she felt like she was talking too loudly. SLP used auditory feedback (recorded her reading) to prove to her that her volume was WNL. 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  10/24/23Estill [name] has not done small sips at home.  Pt started with "hey"s and produced mid-upper 70s dB x2. SLP provided pt a model and she incr'd productions to mid 51s dB. In divergent naming tasks pt req'd rare min A for louder speech, and in semi-structured sentence response tasks pt req'd rare min A for loudness and loudness decay. SLP during this time reminded pt that  her volume was normally loud (70-72 dB) and that was the loudness she produced prior to Parkinson's (PD).  SLP gave pt homework to generate 10 everyday sentences, to cont with HEY, and the reading (if she can do so).  09/12/22: SLP discussed results of evaluation with pt, and educated pt re: loud "hey", and her work to do at home - see pt instructions   PATIENT EDUCATION: Education details: see above in "today's treatment" Person educated: Patient Education method: Explanation, Demonstration, Verbal cues, and Handouts Education comprehension: verbalized understanding, returned demonstration, verbal cues required, and needs further education  HOME EXERCISE PROGRAM: 10 loud "hey", read out loud for 30 seconds x4, BID   GOALS: Goals reviewed with patient? No  SHORT TERM GOALS: Target date: 01/05/23   pt will produce loud /a/ or "hey" with low 80s dB average in 3 sessions  Baseline:09-19-22, 10-17-22 Goal status: Not met  2.  pt will demo abdominal breathing at rest 75% of the time in 3 sessions  Baseline:  Goal status: Deferred - worked on volume only  3.  [name] will answer 15-20 mod complex "wh" questions with average volume in upper 60s dB in 3 sessions  Baseline:  Goal status: Not met  4.  Pt will complete HEP 75% between sessions Baseline:  Goal status: Not met   LONG TERM GOALS: Target date: 02/03/23  pt will demonstrate abdominal breathing in simple Q and A responses 75% of the time in 3 sessions  Baseline:  Goal status: Ongoing; goal not met due to attendance  2.  [name] will produce speech volume in 3 minutes simple conversation with average mid-upper 60s dB in 3 sessions  Baseline:  Goal status: Ongoing; goal not met due to attendance  3.  Pt will produce speech volume of upper 60s dB in 5 minutes simple conversation with occasional nonverbal cues in 3 sessions  Baseline:  Goal status: Ongoing; goal not met due to attendance  4.  Pt will improve PROM measurement  when compared to initial session, when given in last week of speech therapy Baseline:  Goal status: Ongoing; goal not met due to attendance    5.  SLP to monitor pt's intake of liquids to  ensure pulmonary safety    Baseline:    Goal Status: Ongoing; goal not met due to attendance  ASSESSMENT:  CLINICAL IMPRESSION: Pt decided to place tx on hold until January 2024 due to moving into a new home and the holidays. Pt's target dates for goals have been changed. Patient is a 77 y.o. female who was seen today for treatment and recertification of dysarthria/voice. Goals will be ongoing with new target dates for short term/long term goals as we were unable to meet due to pt time off. SLP to monitor/possibly target aspiration precautions for pt's liquid intake to ensure pulmonary safety.  OBJECTIVE IMPAIRMENTS: Objective impairments include dysarthria and voice disorder. These impairments are limiting patient from household responsibilities, ADLs/IADLs, and effectively communicating at home and in community.Factors affecting potential to achieve goals and functional outcome are  none .Marland Kitchen Patient will benefit from skilled SLP services to address above impairments and improve overall function.  REHAB POTENTIAL: Good  PLAN:  SLP FREQUENCY: 2x/week  SLP DURATION: 12 weeks  PLANNED INTERVENTIONS: Aspiration precaution training, Pharyngeal strengthening exercises, Diet toleration management , Cueing hierachy, Internal/external aids, Oral motor exercises, Functional tasks, SLP instruction and feedback, Compensatory strategies, and Patient/family education    [place], CCC-SLP 12/06/2022, 9:45 AM

## 2022-12-15 ENCOUNTER — Ambulatory Visit: Payer: Medicare Other

## 2022-12-15 ENCOUNTER — Ambulatory Visit: Payer: Medicare Other | Admitting: Speech Pathology

## 2022-12-19 ENCOUNTER — Encounter: Payer: Self-pay | Admitting: Speech Pathology

## 2022-12-19 ENCOUNTER — Ambulatory Visit: Payer: Medicare Other | Admitting: Physical Therapy

## 2022-12-19 ENCOUNTER — Ambulatory Visit: Payer: Medicare Other | Admitting: Speech Pathology

## 2022-12-19 DIAGNOSIS — R2681 Unsteadiness on feet: Secondary | ICD-10-CM | POA: Diagnosis not present

## 2022-12-19 DIAGNOSIS — R471 Dysarthria and anarthria: Secondary | ICD-10-CM | POA: Diagnosis not present

## 2022-12-19 DIAGNOSIS — M5459 Other low back pain: Secondary | ICD-10-CM | POA: Diagnosis not present

## 2022-12-19 DIAGNOSIS — R262 Difficulty in walking, not elsewhere classified: Secondary | ICD-10-CM | POA: Diagnosis not present

## 2022-12-19 DIAGNOSIS — M6281 Muscle weakness (generalized): Secondary | ICD-10-CM | POA: Diagnosis not present

## 2022-12-19 DIAGNOSIS — R293 Abnormal posture: Secondary | ICD-10-CM | POA: Diagnosis not present

## 2022-12-19 DIAGNOSIS — G20B1 Parkinson's disease with dyskinesia, without mention of fluctuations: Secondary | ICD-10-CM | POA: Diagnosis not present

## 2022-12-19 NOTE — Therapy (Unsigned)
OUTPATIENT SPEECH LANGUAGE PATHOLOGY TREATMENT & RECERTIFICATION   Patient Name: [name] MRN: [id number] DOB:March 03, 1946, 77 y.o., female Today's Date: 12/19/2022  PCP: [name], MD REFERRING PROVIDER: Alonza Bogus, DO   End of Session - 12/19/22 1013     Visit Number 7    Number of Visits --   Error 15 to 25 sessions   Date for SLP Re-Evaluation 02/04/23    Authorization - Visit Number 3    Authorization - Number of Visits 17    SLP Start Time 1011    SLP Stop Time  [id number]    SLP Time Calculation (min) 40 min    Activity Tolerance Patient tolerated treatment well             Past Medical History:  Diagnosis Date   Allergy    ANEMIA-NOS 09/25/2007   Anxiety    Arthritis    B12 DEFICIENCY 05/03/2007   Blood transfusion without reported diagnosis    CAD (coronary artery disease) 01/2010   MI, Nishan   Cardiomyopathy ([place]) 02/08/2010   H/o this 2012 after urosepsis, no recurrence.    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   OSTEOPOROSIS 08/2009   bisphosphonate on hold 2/2 dysphagia, on reclast done in August each year   Parkinson's disease 08/25/2015   Dx Dr [name] 07/2015    Pneumonia    PONV (postoperative nausea and vomiting)    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   Takotsubo cardiomyopathy 2008   due to E coli urosepsis   Past Surgical History:  Procedure Laterality Date   ABDOMINAL HYSTERECTOMY  1970s    IUD infection - first partial then with oophorectomy (cysts), complication - low blood pressure   CATARACT EXTRACTION Bilateral    CHOLECYSTECTOMY     complication - low blood pressure   COLONOSCOPY  06/2008   h/o polyps but latest WNL, rec rpt 10 yrs Olevia Perches)   COLONOSCOPY  11/2018   multiple TAs (10 polyps total), rpt 2 yrs Fuller Plan)   COLONOSCOPY  04/2021   multiple TAs, rpt 3 yrs Fuller Plan)   CYSTOSCOPY  12/2013   abx treatment for recurrent cystitis   DEXA  04/2013   T -2.9 @ femur, -1.6 @ spine   DEXA  04/2017   T -2.9 hip, -0.7 spine   ESOPHAGOGASTRODUODENOSCOPY  12/2017   WNL, regardless esophagus dilated, small HH Fuller Plan)   EYE SURGERY     LUMBAR LAMINECTOMY/DECOMPRESSION MICRODISCECTOMY Right 11/27/2019   Right Lumbar Four-Five foraminotomy;  Erline Levine, MD)   LUMBAR LAMINECTOMY/DECOMPRESSION MICRODISCECTOMY Right 05/16/2022   Procedure: OPEN LUMBAR LAMINECTOMY, RT, L45 W/LATERAL RECESS DECOMPRESSION;  Surgeon: Karsten Ro, DO;  Location: [place];  Service: Neurosurgery;  Laterality: Right;  3C   MINOR PLACEMENT OF FIDUCIAL N/A 06/30/2021   Procedure: MINOR PLACEMENT OF FIDUCIAL;  Surgeon: Erline Levine, MD;  Location: [place];  Service: Neurosurgery;  Laterality: N/A;  Minor room   PTOSIS REPAIR Bilateral 10/2020   Plastic Surgery   PULSE GENERATOR IMPLANT Right 07/14/2021   Procedure: UNILATERAL PULSE GENERATOR IMPLANT;  Surgeon: Erline Levine, MD;  Location: [place];  Service: Neurosurgery;  Laterality: Right;   SUBTHALAMIC STIMULATOR INSERTION Bilateral 07/07/2021   Procedure: Deep brain stimulator placement;  Surgeon: Erline Levine, MD;  Location: [place];  Service: Neurosurgery;  Laterality: Bilateral;   TUBAL LIGATION     UPPER GASTROINTESTINAL ENDOSCOPY     Patient Active Problem List   Diagnosis Date Noted   Lumbar radiculopathy, chronic 05/16/2022   Pre-op evaluation 04/26/2022   CAP (community acquired pneumonia) 03/29/2022   Localized swelling of chest wall  12/23/2021   Other fatigue 11/25/2021   Sinus pain 11/25/2021   Lymphadenopathy 11/25/2021   Parkinson's disease with dyskinesia 05/28/2021   Closed fracture of rib of right side with routine healing 05/28/2021   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   Left shoulder pain 06/19/2020   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   Degenerative lumbar spinal stenosis 11/27/2019   Knee pain, bilateral 08/06/2018   Constipation 08/06/2018   Right lumbar radiculopathy 04/10/2018   Dysphagia 10/09/2017   Chronic cough 07/16/2017   GAD (generalized anxiety disorder) 04/28/2016   Chronic insomnia 03/07/2016   Left Achilles tendinitis 12/08/2015   Parkinson's disease 08/25/2015   Advanced care planning/counseling discussion 05/26/2015   Family circumstance 02/23/2015   Encounter for general adult medical examination with abnormal findings 05/21/2014   MDD (major depressive disorder), single episode, moderate ([place]) 11/12/2013   DDD (degenerative disc disease), lumbar 05/31/2013   Medicare annual wellness visit, subsequent 05/20/2013   HLD (hyperlipidemia) 05/20/2013   Vitamin D deficiency 05/09/2013   Recurrent UTI 01/03/2011   Rash and other nonspecific skin eruption 05/11/2010   GERD 02/22/2010   CAD (coronary artery disease) 01/25/2010   Osteoporosis 11/10/2009   Orthostatic hypotension 12/06/2008   Anemia 09/25/2007   Vitamin B12 deficiency 05/03/2007   Fibromyalgia 05/03/2007    ONSET DATE: Script written September 2023  REFERRING DIAG: G20 (ICD-10-CM) - Parkinson's disease   THERAPY DIAG:  Dysarthria and anarthria  Rationale for Evaluation and Treatment Rehabilitation  SUBJECTIVE:   SUBJECTIVE STATEMENT: "I have been doing them once a day. My granddaughter makes fun of me!" Pt accompanied by: self  PERTINENT HISTORY: PMHx significant for Parkinsons disease w/ DBS 06/2021, Hx of  orthostatic hypotension, anxiety/depression, RUE/RLE reflex sympathetic dystrophy, osteoporosis, lupus, fibromyalgia, CHF and OA. Lumbar sx June 2023. Pt with evaluation for voice/dysarthria in Oct 2022 but did not return after eval due to medical reason.  PAIN:  Are you having pain?  Yes 5/10  Lower back  Tramadol   LIVING ENVIRONMENT: Lives with: lives with their family (son) Lives in: House/apartment  PLOF:  Level of assistance: Independent with ADLs, Comment: does most housework Employment: Retired  PATIENT GOALS: Talk louder  OBJECTIVE:   DIAGNOSTIC FINDINGS:  Clinical Impression - MBSS   05-10-22             Patient presents with a very mild oropharyngeal dysphagia with primary impairments being delays in anterior to posterior transit of solid texture boluses in oral cavity and mildly delayed pharyngeal peristalsis of puree and mechanical soft solids. She did exhibit mildly delayed swallow initiation of mechanical soft solids at level of vallecular sinus and trace residuals above UES opening. No other pharyngeal  residuals observed with any of the tested consistencies (thin liquids, puree solids, mechanical soft solids, 1/2 barium tablet. No penetration or aspiration observed with liquids or solids. Esophageal sweep did not reveal significant amount of barium stasis and esophageal transit appeared WFL. (this is an improvement as compared to 2018 MBS). Patient would cough after most swallows in absence of any observed penetration or aspiration. She did also have a cough prior to any PO's being given and when asked most recent time she ate, she reported that she has not had anything to eat or drink since last night. SLP reviewed MBS from 2018 which reports "no airway compromise despite frequent coughing immediately post-swallow". Unfortunately, results of this MBS do not explain patient's frequent coughing that occurs immediately after swallow PO's. 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  Patient reports she has been completing her exercises at home 1-2 times a day.  Patient continues to require max assist verbal cues for sequencing diaphragmatic breathing.  Patient average 86.9 dB for sustained phonation /ah/) with cueing for breathing and releasing voice at the top of her breath.  In conversation patient was occasionally aphonic and required verbal cueing to increase vocal intensity.  SLP spoke with son on assisting her with breathing at home, as he is a Primary school teacher.  Will continue with modified LSVT treatment.  Patient reports she would like to decrease frequency to x 1/week.    12/12/22: Pt reported she worked on [employer] for breathing and loud /a/ 1-2x day for 7 days. Pt was subjectively louder upon entry to room today. She reports sometimes that when people call, they cannot hear her. Finished education on exercises for voice.   /ah/: averaged 88-90 dB with cueing for breath support and sequencing  Required max cueing for diaphragmatic breathing Low to high glides were easier than high to low glides. Could not put both together. Pt was more successful when stepping the scale.   Continues to demonstrate tension in neck and shoulders. SLP demonstrated use of a mirror for visual feedback.   12/05/22: Pt was seen for the first time since November. She reports that she has been busy with a move and had a fall. She reported she lost her functional phrases. SLP suggested making new phrases this session. 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  Began edu on homework (diaphragmatic breathing and loud /a/). Pt required max cueing for abdominal breathing.   Good morning! 2. Are you ready for school? 3. Have a good day!  4. I love you 5. Have you got any laundry?  6. Are we going anywhere today?  7. Have you eaten?  8. Does anybody have a doctor's appointment today? 9. How was your day?  10.What's for dinner?  Coversational loudness: 59 dB  Initial "hey": 65 dB With cueing mid 70s dB  10/17/22: [name] has done loud "hey"s at home 5/14 days since last session. SLP told pt that it was imperative she complete loud "hey" x10, BID. Pt going on hold today until January 2024 due to moving into new house and the holidays.  She forgot her 10 everyday sentences today again - SLP told  pt to bring them next session and then we can copy them and leave them here. Loud "hey" today at low-mid 80s with initial cue for loudness. In conversation pt's average was mid-upper 60s dB, but pt had rare rushes of speech which decr'd overall intelligibility. In paragraph reading, pt averaged low 70s dB, but again demonstrated rushes of speech. In addition SLP encouraged pt to overarticulate when reading to minimize impact of rushes of speech. Pt intelligibility improved when she did both of these, to 95-100%.   10/03/22: SLP had pt start with "hey"s and produced mid-upper 70s dB x2. SLP provided pt a model x3 and she incr'd productions to low-mid 80s dB. She req'd usual mod cues for loudness and usual mod-max cues for breath support prior to each "hey!". Pt has done loud "hey" at least once/day, but has not read out loud for 30 seconds. SLP told pt to perform "hey" 10 reps BID, and read out loud 30 seconds x4 reps. Pt's "everyday sentences" had 6/8 that were really said everyday. SLP hjad pt read these with LOUD voice and averaged upper 70s dB. She req'd usual mod A for loudness faded to occasional min A, and occasional min-mod A for breath support prior to each  sentence. In reading paragraphs, [name] averaged upper 60s-low 70s dB and said she felt like she was talking too loudly. SLP used auditory feedback (recorded her reading) to prove to her that her volume was WNL. 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  10/24/23Estill [name] has not done small sips at home.  Pt started with "hey"s and produced mid-upper 70s dB x2. SLP provided pt a model and she incr'd productions to mid 64s dB. In divergent naming tasks pt req'd rare min A for louder speech, and in semi-structured sentence response tasks pt req'd rare min A for loudness and loudness decay. SLP during this time reminded pt that her volume was normally loud (70-72 dB) and that was the loudness she produced prior to Parkinson's (PD).  SLP gave pt homework to generate 10 everyday sentences, to cont with HEY, and the reading (if she can do so).  09/12/22: SLP discussed results of evaluation with pt, and educated pt re: loud "hey", and her work to do at home - see pt instructions   PATIENT EDUCATION: Education details: see above in "today's treatment" Person educated: Patient Education method: Explanation, Demonstration, Verbal cues, and Handouts Education comprehension: verbalized understanding, returned demonstration, verbal cues required, and needs further education  HOME EXERCISE PROGRAM: 10 loud "hey", read out loud for 30 seconds x4, BID   GOALS: Goals reviewed with patient? No  SHORT TERM GOALS: Target date: 01/05/23   pt will produce loud /a/ or "hey" with low 80s dB average in 3 sessions  Baseline:09-19-22, 10-17-22 Goal status: Not met  2.  pt will demo abdominal breathing at rest 75% of the time in 3 sessions  Baseline:  Goal status: Deferred - worked on volume only  3.  [name] will answer 15-20 mod complex "wh" questions with average volume in upper 60s dB in 3 sessions   Baseline:  Goal status: Not met  4.  Pt will complete HEP 75% between sessions Baseline:  Goal status: Not met   LONG TERM GOALS: Target date: 02/03/23  pt will demonstrate abdominal breathing in simple Q and A responses 75% of the time in 3  sessions  Baseline:  Goal status: Ongoing; goal not met due to attendance  2.  [name] will produce speech volume in 3 minutes simple conversation with average mid-upper 60s dB in 3 sessions  Baseline:  Goal status: Ongoing; goal not met due to attendance  3.  Pt will produce speech volume of upper 60s dB in 5 minutes simple conversation with occasional nonverbal cues in 3 sessions  Baseline:  Goal status: Ongoing; goal not met due to attendance  4.  Pt will improve PROM measurement when compared to initial session, when given in last week of speech therapy Baseline:  Goal status: Ongoing; goal not met due to attendance    5.  SLP to monitor pt's intake of liquids to ensure pulmonary safety    Baseline:    Goal Status: Ongoing; goal not met due to attendance  ASSESSMENT:  CLINICAL IMPRESSION: Pt decided to place tx on hold until January 2024 due to moving into a new home and the holidays. Pt's target dates for goals have been changed. Patient is a 77 y.o. female who was seen today for treatment and recertification of dysarthria/voice. Goals will be ongoing with new target dates for short term/long term goals as we were unable to meet due to pt time off. SLP to monitor/possibly target aspiration precautions for pt's liquid intake to ensure pulmonary safety.  OBJECTIVE IMPAIRMENTS: Objective impairments include dysarthria and voice disorder. These impairments are limiting patient from household responsibilities, ADLs/IADLs, and effectively communicating at home and in community.Factors affecting potential to achieve goals and functional outcome are  none .Marland Kitchen Patient will benefit from skilled SLP services to address above impairments and improve  overall function.  REHAB POTENTIAL: Good  PLAN:  SLP FREQUENCY: 2x/week  SLP DURATION: 12 weeks  PLANNED INTERVENTIONS: Aspiration precaution training, Pharyngeal strengthening exercises, Diet toleration management , Cueing hierachy, Internal/external aids, Oral motor exercises, Functional tasks, SLP instruction and feedback, Compensatory strategies, and Patient/family education    [place], CCC-SLP 12/19/2022, 10:14 AM

## 2022-12-19 NOTE — Therapy (Signed)
OUTPATIENT PHYSICAL THERAPY NEURO   Patient Name: [name] MRN: [id number] DOB:1946-11-07, 77 y.o., female Today's Date: 12/19/2022   PCP: Leary Roca PROVIDER: [name]   PT End of Session - 12/19/22 0932     Visit Number 11    Date for PT Re-Evaluation 01/05/23    Authorization Type [employer] Medicare    PT Start Time 0930    PT Stop Time 5621    PT Time Calculation (min) 45 min              Past Medical History:  Diagnosis Date   Allergy    ANEMIA-NOS 09/25/2007   Anxiety    Arthritis    B12 DEFICIENCY 05/03/2007   Blood transfusion without reported diagnosis    CAD (coronary artery disease) 01/2010   MI, Nishan   Cardiomyopathy ([place]) 02/08/2010   H/o this 2012 after urosepsis, no recurrence.    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   OSTEOPOROSIS 08/2009   bisphosphonate on hold 2/2 dysphagia, on reclast done in August each year   Parkinson's disease 08/25/2015   Dx Dr [name] 07/2015    Pneumonia    PONV (postoperative nausea and vomiting)    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   Takotsubo cardiomyopathy 2008   due to E coli urosepsis   Past Surgical History:  Procedure Laterality Date   ABDOMINAL HYSTERECTOMY  1970s   IUD infection - first partial then with oophorectomy (cysts), complication - low blood pressure   CATARACT EXTRACTION Bilateral    CHOLECYSTECTOMY     complication - low blood pressure    COLONOSCOPY  06/2008   h/o polyps but latest WNL, rec rpt 10 yrs Olevia Perches)   COLONOSCOPY  11/2018   multiple TAs (10 polyps total), rpt 2 yrs Fuller Plan)   COLONOSCOPY  04/2021   multiple TAs, rpt 3 yrs [facility] Plan)   CYSTOSCOPY  12/2013   abx treatment for recurrent cystitis   DEXA  04/2013   T -2.9 @ femur, -1.6 @ spine   DEXA  04/2017   T -2.9 hip, -0.7 spine   ESOPHAGOGASTRODUODENOSCOPY  12/2017   WNL, regardless esophagus dilated, small HH [facility])   EYE SURGERY     LUMBAR LAMINECTOMY/DECOMPRESSION MICRODISCECTOMY Right 11/27/2019   Right Lumbar Four-Five foraminotomy;  Erline Levine, MD)   LUMBAR LAMINECTOMY/DECOMPRESSION MICRODISCECTOMY Right 05/16/2022   Procedure: OPEN LUMBAR LAMINECTOMY, RT, L45 W/LATERAL RECESS DECOMPRESSION;  Surgeon: Karsten Ro, DO;  Location: [facility];  Service: Neurosurgery;  Laterality: Right;  3C   MINOR PLACEMENT OF FIDUCIAL N/A 06/30/2021   Procedure: MINOR PLACEMENT OF FIDUCIAL;  Surgeon: Erline Levine, MD;  Location: [facility];  Service: Neurosurgery;  Laterality: N/A;  Minor room   PTOSIS REPAIR Bilateral 10/2020   Plastic Surgery   PULSE GENERATOR IMPLANT Right 07/14/2021   Procedure: UNILATERAL PULSE GENERATOR IMPLANT;  Surgeon: Erline Levine,  MD;  Location: [facility];  Service: Neurosurgery;  Laterality: Right;   SUBTHALAMIC STIMULATOR INSERTION Bilateral 07/07/2021   Procedure: Deep brain stimulator placement;  Surgeon: Erline Levine, MD;  Location: [facility];  Service: Neurosurgery;  Laterality: Bilateral;   TUBAL LIGATION     UPPER GASTROINTESTINAL ENDOSCOPY     Patient Active Problem List   Diagnosis Date Noted   Lumbar radiculopathy, chronic 05/16/2022   Pre-op evaluation 04/26/2022   CAP (community acquired pneumonia) 03/29/2022   Localized swelling of chest wall 12/23/2021   Other fatigue 11/25/2021   Sinus pain 11/25/2021   Lymphadenopathy 11/25/2021   Parkinson's disease with dyskinesia 05/28/2021   Closed fracture of rib of right side with routine  healing 05/28/2021   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   Left shoulder pain 06/19/2020   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   Degenerative lumbar spinal stenosis 11/27/2019   Knee pain, bilateral 08/06/2018   Constipation 08/06/2018   Right lumbar radiculopathy 04/10/2018   Dysphagia 10/09/2017   Chronic cough 07/16/2017   GAD (generalized anxiety disorder) 04/28/2016   Chronic insomnia 03/07/2016   Left Achilles tendinitis 12/08/2015   Parkinson's disease 08/25/2015   Advanced care planning/counseling discussion 05/26/2015   Family circumstance 02/23/2015   Encounter for general adult medical examination with abnormal findings 05/21/2014   MDD (major depressive disorder), single episode, moderate ([place]) 11/12/2013   DDD (degenerative disc disease), lumbar 05/31/2013   Medicare annual wellness visit, subsequent 05/20/2013   HLD (hyperlipidemia) 05/20/2013   Vitamin D deficiency 05/09/2013   Recurrent UTI 01/03/2011   Rash and other nonspecific skin eruption 05/11/2010   GERD 02/22/2010   CAD (coronary artery disease) 01/25/2010   Osteoporosis 11/10/2009   Orthostatic hypotension 12/06/2008   Anemia 09/25/2007   Vitamin B12 deficiency 05/03/2007   Fibromyalgia 05/03/2007    ONSET DATE: 06/28/2022  REFERRING DIAG: Parkinson's, S/P lumbar surgery  THERAPY DIAG:  Parkinson's disease with dyskinesia, unspecified whether manifestations fluctuate  Difficulty in walking, not elsewhere classified  Muscle weakness (generalized)  Other low back pain  Rationale for Evaluation and Treatment Rehabilitation  SUBJECTIVE:                                                                                                                                                                                              SUBJECTIVE STATEMENT:    Back hurts esp going form sitting to standing some  shooting pain RT Leg. No recent falls . Wallking about 15 min daily weather permitting.  Pt accompanied by: self  PERTINENT HISTORY:  see above, did have lumbar surgery in June, has a deep brain stimulator for movement disorder  PAIN:  Are you having pain? Yes, 6/10 LB  PRECAUTIONS: None  WEIGHT BEARING RESTRICTIONS No  FALLS: Has patient fallen in last 6 months? No  LIVING ENVIRONMENT: Lives with: lives with their family Lives in: House/apartment Stairs: Yes: Internal: 12 steps; can reach both Has following equipment at home: Single point cane and Walker - 4 wheeled  PLOF: Independent, does some housework  PATIENT GOALS:  be stronger, walk longer and better  OBJECTIVE:   COGNITION: Overall cognitive status: Within functional limits for tasks assessed   SENSATION: WFL Palpation she is very tender and tight in the lumbar area  POSTURE: rounded shoulders, forward head, and decreased lumbar lordosis  LOWER EXTREMITY ROM:    Motions are WFL's but she has poor control of the right LE, weakness and dyskinesia  LOWER EXTREMITY MMT:    MMT Right Eval Left Eval Right and left 12/05/22  Hip flexion 3+ 4 3+  Hip extension 4- 4 3+  Hip abduction 4- 4 3+  Hip adduction 4- 4 3+  Hip internal rotation     Hip external rotation     Knee flexion 3+ 4 3+  Knee extension 3+ 4 3+  Ankle dorsiflexion 4- 4- 4-  Ankle plantarflexion     Ankle inversion     Ankle eversion 3+ 4-   (Blank rows = not tested)  TRANSFERS:  Has to use hands, if not falls back into chair  GAIT: Gait pattern: has extraneous motions of the right arm and the right leg and the shoulders and head, off balance at times but typically corrects on own, right LE tends to circumduct and has decreased control of the knee and the ankle Distance walked: 400 feet Assistive device utilized: None Level of assistance: Complete Independence Comments: on stairs does one at a time  FUNCTIONAL TESTs:  5 times sit to  stand: 21 seconds  10/17/22 13.38 sec but decreased eccentric conrtrol.  12/05/22 = 55 seconds with back pain and definite need for hands Timed up and go (TUG): 17 seconds  10/17/22 9.2 sec without AD, 1,9,24 with SPC = 42 secondsa BERG balance 39/56  10/17/22 45/56  TODAY'S TREATMENT:   12/19/22  Nustep L 5 40mn Standing hip ext and abd 10 x each leg with UE support and red tband- abd difficult Resisted gait 20# 4 way 4 x each- CGA but no LOB Black stand trunk ext 2 sets 10 Seated row and lat pull 20# 2 sets 10 Leg Press and calf raises 20# 2 sets 12 ( for leg press seat on #8 any lower unabl to press up) Wt ball standing OH trunk ext and rotation 10 x each STS with wt ball press Supine core stab 10 min    12/12/22 NuStep L5 x689ms  STS 2x10 Feet on ball K2C, trunk rotation, small bridge x10 Leg extension 5# 2x10 Leg curls 15# 2x10 Box taps 6"  Volleyball hits sitting and then standing  Standing rows and ext red 2x10    12/05/22 TUG with SPC 42 seconds 5XSTS 55 seconds needing to use hands and struggling Issued HEP Talked about body mechanics for up and down from sitting and lying down as well as bed mobility MHP/IFC to the low back in sitting   10/17/22 Nustep L 5 6 min Func testing performed 5 times sit to stand: 21 seconds  10/17/22 13.38 sec but decreased eccentric conrtrol  Timed up and go (TUG): 17 seconds  10/17/22 9.2 sec without AD BERG balance 39/56  10/17/22 45/56 UBE L 3 2 min fwd/2 min back Resisted gait 4 ways 4 x 30# CG-min A HS curls 25# 2 sets 10 Knee ext 15# 2 sets 10 Side stepping in and out of 6 inch box on foam pads 6 inch alt box taps on foam mat 20 x 2 set s-min A    10/03/22 Nustep L 5 40mn Bike L 4 690m UBE L 3 40m12mResisted gait 4 ways 4 x 30# Black bar 20x heel raise/toe raise 6 inch step up 10 x each leg from airex 6 inch step up and over- min A with 2 LOB requiring assistance to prevent fall STS on airex with wt ball toss 2 sets 5-  good righting reaction but LE weakness noted Tandem gait fwd and back on foam beam in // bars 5 x- occasional UE use Side stepping foam beam // bars 5 x-occasional UE use HS curls 25# 2 sets 10 Knee ext 15# 2 sets 10  09/26/22 Nustep level 5 x 6 minutes Bike level 4 x 6 minutes UBE level 3 x 6 minutes Walking ball toss On airex ball toss Volleyball Direction changes, speed changes  09/21/22 Walk around the building in the back good pace, some extra motions are more pronounced this AM Nustep level 5 x 6 minutes UBE level 3 x 5 minutes 5# straight arm pulls Leg press 20#  2x10 Red tband DF/PF Volleyball On airex cone toe touches Side stepping back wards walk, direction changes Walking ball toss fwd and backward  09/19/22  Nustep L 5 6 min Knee ext 5# 2 sets 10 HC curl 15# 2 sets 10 Black tband trunk flex and ext 2 sets 10 Seated row 20# 2 sets 10 Lat pull 20# 2 sets 10 20# resisted gait 4 x 4 way - MIn A d/t several stumbles STS on airex 10 x 2 sets- decreased initial standing with posterior lean and decreased eccentric lowering Ball toss on airex Side stepping over airex Alt step tap 6 inch 20 x 2 sets   09/14/22 Bike level 3 x 6 minutes Gait around the building 1 lap Feet on ball K2C, trunk rotation, small bridge, isometric abs Leg extension 5# 2x10 Leg curls 15# 2x10 Volley ball Walking ball toss Ball kicks Nustep level 5 x 5 minutes  PATIENT EDUCATION: Education details: POC Person educated: Patient and Child(ren) Education method: Explanation Education comprehension: verbalized understanding   HOME EXERCISE PROGRAM: Advised to walk 10 - 20 minutes a day Access Code: [email or web address] URL: [email or web address] Date: 12/05/2022 Prepared by: MicLum Babexercises - Supine Lower Trunk Rotation  - 2 x daily - 7 x weekly - 1 sets - 10 reps - 10 hold - Hooklying Single Knee to Chest Stretch  - 2 x daily - 7 x weekly - 1 sets - 10 reps - 10  hold - Supine Double Knee to Chest  - 2 x daily - 7 x weekly - 1 sets - 10 reps - 10 hold   GOALS: Goals reviewed with patient? Yes  SHORT TERM GOALS: Target date: 09/18/22  Independent with initial HEP  Goal status:met   LONG TERM GOALS: Target date: 11/27/22  Independent with advanced HEP Goal status: progressing    2.  Decrease TUG time to 12 seconds Goal status: regressed and was at 42 seconds on 12/05/22  3.  Increase BERG balance test score to  46/56 Goal status:ongoing 10/17/22 45  4.  Increase right LE strength to 4-/5 Goal status:progressing regressed to 3+ 12/05/22  5.  Get up from sitting without using hands Goal status regressed has to have hands to get up  ASSESSMENT:  CLINICAL IMPRESSION:   pt arrives denying any recent falls. States walking daily,weather permitting 15 min and that is helping. Pt stataes pain worse with sitting esp with standing from being seated 5/10. Pt states occasional radiating pain RT posterior leg. Pt also verb may need to decrease freq. Contrinue to progress ex for strength. Progressing towards goals of increase strength and balance.  OBJECTIVE IMPAIRMENTS Abnormal gait, decreased activity tolerance, decreased balance, decreased coordination, decreased endurance, decreased mobility, difficulty walking, decreased ROM, decreased strength, increased muscle spasms, impaired flexibility, improper body mechanics, postural dysfunction, and pain.   REHAB POTENTIAL: Good  CLINICAL DECISION MAKING: Stable/uncomplicated  EVALUATION COMPLEXITY: Low  PLAN: PT FREQUENCY: 1-2x/week  PT DURATION: 12 weeks  PLANNED INTERVENTIONS: Therapeutic exercises, Therapeutic activity, Neuromuscular re-education, Balance training, Gait training, Patient/Family education, Self Care, Joint mobilization, Stair training, Electrical stimulation, Spinal mobilization, Cryotherapy, Moist heat, and Manual therapy  PLAN FOR NEXT SESSION: will work on strength and function  and help back pain. Pt is possibly needing to decrease to 1 x a week   Enmanuel Zufall,ANGIE, PTA 12/19/2022, 9:33 [facility] [address] [place]. [place], [place], [address] Phone: [phone]   Fax:  [phone]  Patient Details  Name: TYMEKA PRIVETTE MRN: [id number] Date of Birth: 05/25/1946 Referring Provider:  [name], MD  Encounter Date: 12/19/2022   Laqueta Carina, PTA 12/19/2022, 9:33 AM  [facility] [place]. [place], [place], [address] Phone: [phone]   Fax:  [phone]

## 2022-12-21 ENCOUNTER — Ambulatory Visit: Payer: Medicare Other | Admitting: Physical Therapy

## 2022-12-21 ENCOUNTER — Ambulatory Visit: Payer: Medicare Other | Admitting: Speech Pathology

## 2022-12-27 ENCOUNTER — Ambulatory Visit: Payer: Medicare Other | Admitting: Physical Therapy

## 2022-12-27 ENCOUNTER — Encounter: Payer: Self-pay | Admitting: Physical Therapy

## 2022-12-27 ENCOUNTER — Ambulatory Visit: Payer: Medicare Other | Admitting: Speech Pathology

## 2022-12-27 ENCOUNTER — Other Ambulatory Visit: Payer: Self-pay | Admitting: Family Medicine

## 2022-12-27 ENCOUNTER — Encounter: Payer: Self-pay | Admitting: Speech Pathology

## 2022-12-27 DIAGNOSIS — R293 Abnormal posture: Secondary | ICD-10-CM | POA: Diagnosis not present

## 2022-12-27 DIAGNOSIS — M6281 Muscle weakness (generalized): Secondary | ICD-10-CM

## 2022-12-27 DIAGNOSIS — R262 Difficulty in walking, not elsewhere classified: Secondary | ICD-10-CM | POA: Diagnosis not present

## 2022-12-27 DIAGNOSIS — G20B1 Parkinson's disease with dyskinesia, without mention of fluctuations: Secondary | ICD-10-CM | POA: Diagnosis not present

## 2022-12-27 DIAGNOSIS — R471 Dysarthria and anarthria: Secondary | ICD-10-CM

## 2022-12-27 DIAGNOSIS — R2681 Unsteadiness on feet: Secondary | ICD-10-CM | POA: Diagnosis not present

## 2022-12-27 DIAGNOSIS — M5459 Other low back pain: Secondary | ICD-10-CM | POA: Diagnosis not present

## 2022-12-27 NOTE — Therapy (Signed)
OUTPATIENT SPEECH LANGUAGE PATHOLOGY TREATMENT & RECERTIFICATION   Patient Name: [name] MRN: [id number] DOB:March 03, 1946, 77 y.o., female Today's Date: 12/19/2022  PCP: [name], MD REFERRING PROVIDER: Alonza Bogus, DO   End of Session - 12/19/22 1013     Visit Number 7    Number of Visits --   Error 15 to 25 sessions   Date for SLP Re-Evaluation 02/04/23    Authorization - Visit Number 3    Authorization - Number of Visits 17    SLP Start Time 1011    SLP Stop Time  [id number]    SLP Time Calculation (min) 40 min    Activity Tolerance Patient tolerated treatment well             Past Medical History:  Diagnosis Date   Allergy    ANEMIA-NOS 09/25/2007   Anxiety    Arthritis    B12 DEFICIENCY 05/03/2007   Blood transfusion without reported diagnosis    CAD (coronary artery disease) 01/2010   MI, Nishan   Cardiomyopathy ([place]) 02/08/2010   H/o this 2012 after urosepsis, no recurrence.    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   OSTEOPOROSIS 08/2009   bisphosphonate on hold 2/2 dysphagia, on reclast done in August each year   Parkinson's disease 08/25/2015   Dx Dr [name] 07/2015    Pneumonia    PONV (postoperative nausea and vomiting)    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   Takotsubo cardiomyopathy 2008   due to E coli urosepsis   Past Surgical History:  Procedure Laterality Date   ABDOMINAL HYSTERECTOMY  1970s    IUD infection - first partial then with oophorectomy (cysts), complication - low blood pressure   CATARACT EXTRACTION Bilateral    CHOLECYSTECTOMY     complication - low blood pressure   COLONOSCOPY  06/2008   h/o polyps but latest WNL, rec rpt 10 yrs Olevia Perches)   COLONOSCOPY  11/2018   multiple TAs (10 polyps total), rpt 2 yrs Fuller Plan)   COLONOSCOPY  04/2021   multiple TAs, rpt 3 yrs Fuller Plan)   CYSTOSCOPY  12/2013   abx treatment for recurrent cystitis   DEXA  04/2013   T -2.9 @ femur, -1.6 @ spine   DEXA  04/2017   T -2.9 hip, -0.7 spine   ESOPHAGOGASTRODUODENOSCOPY  12/2017   WNL, regardless esophagus dilated, small HH Fuller Plan)   EYE SURGERY     LUMBAR LAMINECTOMY/DECOMPRESSION MICRODISCECTOMY Right 11/27/2019   Right Lumbar Four-Five foraminotomy;  Erline Levine, MD)   LUMBAR LAMINECTOMY/DECOMPRESSION MICRODISCECTOMY Right 05/16/2022   Procedure: OPEN LUMBAR LAMINECTOMY, RT, L45 W/LATERAL RECESS DECOMPRESSION;  Surgeon: Karsten Ro, DO;  Location: [place];  Service: Neurosurgery;  Laterality: Right;  3C   MINOR PLACEMENT OF FIDUCIAL N/A 06/30/2021   Procedure: MINOR PLACEMENT OF FIDUCIAL;  Surgeon: Erline Levine, MD;  Location: [place];  Service: Neurosurgery;  Laterality: N/A;  Minor room   PTOSIS REPAIR Bilateral 10/2020   Plastic Surgery   PULSE GENERATOR IMPLANT Right 07/14/2021   Procedure: UNILATERAL PULSE GENERATOR IMPLANT;  Surgeon: Erline Levine, MD;  Location: [place];  Service: Neurosurgery;  Laterality: Right;   SUBTHALAMIC STIMULATOR INSERTION Bilateral 07/07/2021   Procedure: Deep brain stimulator placement;  Surgeon: Erline Levine, MD;  Location: [place];  Service: Neurosurgery;  Laterality: Bilateral;   TUBAL LIGATION     UPPER GASTROINTESTINAL ENDOSCOPY     Patient Active Problem List   Diagnosis Date Noted   Lumbar radiculopathy, chronic 05/16/2022   Pre-op evaluation 04/26/2022   CAP (community acquired pneumonia) 03/29/2022   Localized swelling of chest wall  12/23/2021   Other fatigue 11/25/2021   Sinus pain 11/25/2021   Lymphadenopathy 11/25/2021   Parkinson's disease with dyskinesia 05/28/2021   Closed fracture of rib of right side with routine healing 05/28/2021   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   Left shoulder pain 06/19/2020   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   Degenerative lumbar spinal stenosis 11/27/2019   Knee pain, bilateral 08/06/2018   Constipation 08/06/2018   Right lumbar radiculopathy 04/10/2018   Dysphagia 10/09/2017   Chronic cough 07/16/2017   GAD (generalized anxiety disorder) 04/28/2016   Chronic insomnia 03/07/2016   Left Achilles tendinitis 12/08/2015   Parkinson's disease 08/25/2015   Advanced care planning/counseling discussion 05/26/2015   Family circumstance 02/23/2015   Encounter for general adult medical examination with abnormal findings 05/21/2014   MDD (major depressive disorder), single episode, moderate ([place]) 11/12/2013   DDD (degenerative disc disease), lumbar 05/31/2013   Medicare annual wellness visit, subsequent 05/20/2013   HLD (hyperlipidemia) 05/20/2013   Vitamin D deficiency 05/09/2013   Recurrent UTI 01/03/2011   Rash and other nonspecific skin eruption 05/11/2010   GERD 02/22/2010   CAD (coronary artery disease) 01/25/2010   Osteoporosis 11/10/2009   Orthostatic hypotension 12/06/2008   Anemia 09/25/2007   Vitamin B12 deficiency 05/03/2007   Fibromyalgia 05/03/2007    ONSET DATE: Script written September 2023  REFERRING DIAG: G20 (ICD-10-CM) - Parkinson's disease   THERAPY DIAG:  Dysarthria and anarthria  Rationale for Evaluation and Treatment Rehabilitation  SUBJECTIVE:   SUBJECTIVE STATEMENT: "I've been doing them once a day, sometimes twice." Pt accompanied by: self  PERTINENT HISTORY: PMHx significant for Parkinsons disease w/ DBS 06/2021, Hx of orthostatic hypotension,  anxiety/depression, RUE/RLE reflex sympathetic dystrophy, osteoporosis, lupus, fibromyalgia, CHF and OA. Lumbar sx June 2023. Pt with evaluation for voice/dysarthria in Oct 2022 but did not return after eval due to medical reason.  PAIN:  Are you having pain?  Yes 5/10  Lower back  Tramadol   LIVING ENVIRONMENT: Lives with: lives with their family (son) Lives in: House/apartment  PLOF:  Level of assistance: Independent with ADLs, Comment: does most housework Employment: Retired  PATIENT GOALS: Talk louder  OBJECTIVE:   DIAGNOSTIC FINDINGS:  Clinical Impression - MBSS   05-10-22             Patient presents with a very mild oropharyngeal dysphagia with primary impairments being delays in anterior to posterior transit of solid texture boluses in oral cavity and mildly delayed pharyngeal peristalsis of puree and mechanical soft solids. She did exhibit mildly delayed swallow initiation of mechanical soft solids at level of vallecular sinus and trace residuals above UES opening. No other pharyngeal residuals observed with any of  the tested consistencies (thin liquids, puree solids, mechanical soft solids, 1/2 barium tablet. No penetration or aspiration observed with liquids or solids. Esophageal sweep did not reveal significant amount of barium stasis and esophageal transit appeared WFL. (this is an improvement as compared to 2018 MBS). Patient would cough after most swallows in absence of any observed penetration or aspiration. She did also have a cough prior to any PO's being given and when asked most recent time she ate, she reported that she has not had anything to eat or drink since last night. SLP reviewed MBS from 2018 which reports "no airway compromise despite frequent coughing immediately post-swallow". Unfortunately, results of this MBS do not explain patient's frequent coughing that occurs immediately after swallow PO's. 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 Patient averaged 87 dB for sustained phonation /ah/ with cueing for breathing and speech clarity by dropping the jaw.  Patient required mod to max assist verbal cues for sequencing diaphragmatic breathing.  Patient continues to have difficulty with scales.  SLP provided video example of person completing skills and attempted to have patient match scales using "dodah" vs. Ah.  Upon exiting treatment room, patient saw physical therapist and utilized her loud and clear voice to say, " BYE MIKE!" Will continue with modified LSVT treatment.  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  Patient reports she has been completing her exercises at home 1-2 times a day.  Patient continues to require max assist verbal cues for sequencing diaphragmatic breathing.  Patient average 86.9 dB for sustained phonation /ah/) with cueing for breathing and releasing voice at the top of her breath.  In conversation patient was occasionally aphonic and required verbal cueing to increase vocal intensity.  SLP spoke with son on assisting her with breathing at home, as he is a Primary school teacher.  Will continue with modified LSVT treatment.  Patient reports she would like to decrease frequency to x 1/week.    12/12/22: Pt reported she worked on [employer] for breathing and loud /a/ 1-2x day for 7 days. Pt was subjectively louder upon entry to room today. She reports sometimes that when people call, they cannot hear her. Finished education on exercises for voice.   /ah/: averaged 88-90  dB with cueing for breath support and sequencing  Required max cueing for diaphragmatic breathing Low to high glides were easier than high to low glides. Could not put both together. Pt was more successful when stepping the scale.   Continues to demonstrate tension in neck and shoulders. SLP demonstrated use of a mirror for visual feedback.   12/05/22: Pt was seen for the first time since November. She reports that she has been busy with a move and had a fall. She reported she lost her functional phrases. SLP suggested making new phrases this session. 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 Began edu on homework (diaphragmatic breathing and loud /a/). Pt required max cueing for abdominal breathing.   Good morning! 2. Are you ready for school? 3. Have a good day!  4. I love you 5. Have you got any laundry?  6. Are we going  anywhere today?  7. Have you eaten?  8. Does anybody have a doctor's appointment today? 9. How was your day?  10.What's for dinner?  Coversational loudness: 59 dB  Initial "hey": 65 dB With cueing mid 70s dB  10/17/22: Mayrin has done loud "hey"s at home 5/14 days since last session. SLP told pt that it was imperative she complete loud "hey" x10, BID. Pt going on hold today until January 2024 due to moving into new house and the holidays.  She forgot her 10 everyday sentences today again - SLP told pt to bring them next session and then we can copy them and leave them here. Loud "hey" today at low-mid 80s with initial cue for loudness. In conversation pt's average was mid-upper 60s dB, but pt had rare rushes of speech which decr'd overall intelligibility. In paragraph reading, pt averaged low 70s dB, but again demonstrated rushes of speech. In addition SLP encouraged pt to overarticulate when reading to minimize impact of rushes of speech. Pt intelligibility improved when she did both of these, to 95-100%.   10/03/22: SLP had pt start with "hey"s and produced  mid-upper 70s dB x2. SLP provided pt a model x3 and she incr'd productions to low-mid 80s dB. She req'd usual mod cues for loudness and usual mod-max cues for breath support prior to each "hey!". Pt has done loud "hey" at least once/day, but has not read out loud for 30 seconds. SLP told pt to perform "hey" 10 reps BID, and read out loud 30 seconds x4 reps. Pt's "everyday sentences" had 6/8 that were really said everyday. SLP hjad pt read these with LOUD voice and averaged upper 70s dB. She req'd usual mod A for loudness faded to occasional min A, and occasional min-mod A for breath support prior to each sentence. In reading paragraphs, Rebecka averaged upper 60s-low 70s dB and said she felt like she was talking too loudly. SLP used auditory feedback (recorded her reading) to prove to her that her volume was WNL. 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  10/24/23Estill Bamberg has not done small sips at home.  Pt started with "hey"s and produced mid-upper 70s dB x2. SLP provided pt a model and she incr'd productions to mid 58s dB. In divergent naming tasks pt req'd rare min A for louder speech, and in semi-structured sentence response tasks pt req'd rare min A for loudness and loudness decay. SLP during this time reminded pt that her volume was normally loud (70-72 dB) and that was the loudness she produced prior to Parkinson's (PD).  SLP gave pt homework to generate 10 everyday sentences, to cont with HEY, and the reading (if she can do so).  09/12/22: SLP discussed results of evaluation with pt, and educated pt re: loud "hey", and her work to do at home - see pt instructions   PATIENT EDUCATION: Education details: see above in "today's treatment" Person educated: Patient Education method: Explanation, Demonstration, Verbal cues, and Handouts Education comprehension: verbalized understanding,  returned demonstration, verbal cues required, and needs further education  HOME EXERCISE PROGRAM: 10 loud "hey", read out loud for 30 seconds x4, BID   GOALS: Goals reviewed with patient? No  SHORT TERM GOALS: Target date: 01/05/23   pt will produce loud /a/ or "hey" with low 80s dB average in 3 sessions  Baseline:09-19-22, 10-17-22 Goal status: Not met  2.  pt will demo abdominal breathing at rest 75% of the time in 3 sessions  Baseline:  Goal status: Deferred - worked on volume only  3.  Donnamarie will answer 15-20 mod complex "wh" questions with average volume in upper 60s dB in 3 sessions  Baseline:  Goal status: Not met  4.  Pt will complete HEP 75% between sessions Baseline:  Goal status: Not met   LONG TERM GOALS: Target date: 02/03/23  pt will demonstrate abdominal breathing in simple Q and A responses 75% of the time in 3 sessions  Baseline:  Goal status: Ongoing; goal not met due to attendance  2.  Shelle will produce speech volume in 3 minutes simple conversation with average mid-upper 60s dB in 3 sessions  Baseline:  Goal status: Ongoing; goal not met due to attendance  3.  Pt will produce speech volume of upper 60s dB in 5 minutes simple conversation with occasional nonverbal cues in 3 sessions  Baseline:  Goal status: Ongoing; goal not met due to attendance  4.  Pt will improve PROM measurement when compared to initial session, when given in last week of speech therapy Baseline:  Goal status: Ongoing; goal not met due to attendance    5.  SLP to monitor pt's intake of liquids to ensure pulmonary safety    Baseline:    Goal Status: Ongoing; goal not met due to attendance  ASSESSMENT:  CLINICAL IMPRESSION: Pt decided to place tx on hold until January 2024 due to moving into a new home and the holidays. Pt's target dates for goals have been changed. Patient is a 77 y.o. female who was seen today for treatment and recertification of dysarthria/voice. Goals will  be ongoing with new target dates for short term/long term goals as we were unable to meet due to pt time off. SLP to monitor/possibly target aspiration precautions for pt's liquid intake to ensure pulmonary safety.  OBJECTIVE IMPAIRMENTS: Objective impairments include dysarthria and voice disorder. These impairments are limiting patient from household responsibilities, ADLs/IADLs, and effectively communicating at home and in community.Factors affecting potential to achieve goals and functional outcome are  none .Marland Kitchen Patient will benefit from skilled SLP services to address above impairments and improve overall function.  REHAB POTENTIAL: Good  PLAN:  SLP FREQUENCY: 2x/week  SLP DURATION: 12 weeks  PLANNED INTERVENTIONS: Aspiration precaution training, Pharyngeal strengthening exercises, Diet toleration management , Cueing hierachy, Internal/external aids, Oral motor exercises, Functional tasks, SLP instruction and feedback, Compensatory strategies, and Patient/family education    [place], CCC-SLP 12/19/2022, 10:14 AM

## 2022-12-27 NOTE — Therapy (Signed)
OUTPATIENT PHYSICAL THERAPY NEURO   Patient Name: [name] MRN: [id number] DOB:1946/10/05, 77 y.o., female Today's Date: 12/27/2022   PCP: Leary Roca PROVIDER: [name]   PT End of Session - 12/27/22 1021     Visit Number 12    Date for PT Re-Evaluation 01/05/23    Authorization Type [employer] Medicare    PT Start Time 0737    PT Stop Time 1100    PT Time Calculation (min) 45 min    Activity Tolerance Patient tolerated treatment well    Behavior During Therapy [facility] for tasks assessed/performed              Past Medical History:  Diagnosis Date   Allergy    ANEMIA-NOS 09/25/2007   Anxiety    Arthritis    B12 DEFICIENCY 05/03/2007   Blood transfusion without reported diagnosis    CAD (coronary artery disease) 01/2010   MI, Nishan   Cardiomyopathy ([place]) 02/08/2010   H/o this 2012 after urosepsis, no recurrence.    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   OSTEOPOROSIS 08/2009   bisphosphonate on hold 2/2 dysphagia, on reclast done in August each year   Parkinson's disease 08/25/2015   Dx Dr [name] 07/2015    Pneumonia    PONV (postoperative nausea and vomiting)    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   Takotsubo cardiomyopathy 2008   due to E coli urosepsis   Past Surgical History:  Procedure Laterality Date   ABDOMINAL HYSTERECTOMY  1970s   IUD infection - first partial then with oophorectomy (cysts), complication -  low blood pressure   CATARACT EXTRACTION Bilateral    CHOLECYSTECTOMY     complication - low blood pressure   COLONOSCOPY  06/2008   h/o polyps but latest WNL, rec rpt 10 yrs Olevia Perches)   COLONOSCOPY  11/2018   multiple TAs (10 polyps total), rpt 2 yrs Fuller Plan)   COLONOSCOPY  04/2021   multiple TAs, rpt 3 yrs Fuller Plan)   CYSTOSCOPY  12/2013   abx treatment for recurrent cystitis   DEXA  04/2013   T -2.9 @ femur, -1.6 @ spine   DEXA  04/2017   T -2.9 hip, -0.7 spine   ESOPHAGOGASTRODUODENOSCOPY  12/2017   WNL, regardless esophagus dilated, small HH Fuller Plan)   EYE SURGERY     LUMBAR LAMINECTOMY/DECOMPRESSION MICRODISCECTOMY Right 11/27/2019   Right Lumbar Four-Five foraminotomy;  Erline Levine, MD)   LUMBAR LAMINECTOMY/DECOMPRESSION MICRODISCECTOMY Right 05/16/2022   Procedure: OPEN LUMBAR LAMINECTOMY, RT, L45 W/LATERAL RECESS DECOMPRESSION;  Surgeon: Karsten Ro, DO;  Location: [place];  Service: Neurosurgery;  Laterality: Right;  3C   MINOR PLACEMENT OF FIDUCIAL N/A 06/30/2021   Procedure: MINOR PLACEMENT OF FIDUCIAL;  Surgeon: Erline Levine, MD;  Location: [place];  Service: Neurosurgery;  Laterality: N/A;  Minor room   PTOSIS REPAIR Bilateral 10/2020   Plastic  Surgery   PULSE GENERATOR IMPLANT Right 07/14/2021   Procedure: UNILATERAL PULSE GENERATOR IMPLANT;  Surgeon: Erline Levine, MD;  Location: [place];  Service: Neurosurgery;  Laterality: Right;   SUBTHALAMIC STIMULATOR INSERTION Bilateral 07/07/2021   Procedure: Deep brain stimulator placement;  Surgeon: Erline Levine, MD;  Location: [place];  Service: Neurosurgery;  Laterality: Bilateral;   TUBAL LIGATION     UPPER GASTROINTESTINAL ENDOSCOPY     Patient Active Problem List   Diagnosis Date Noted   Lumbar radiculopathy, chronic 05/16/2022   Pre-op evaluation 04/26/2022   CAP (community acquired pneumonia) 03/29/2022   Localized swelling of chest wall 12/23/2021   Other fatigue 11/25/2021   Sinus pain 11/25/2021   Lymphadenopathy  11/25/2021   Parkinson's disease with dyskinesia 05/28/2021   Closed fracture of rib of right side with routine healing 05/28/2021   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   Left shoulder pain 06/19/2020   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   Degenerative lumbar spinal stenosis 11/27/2019   Knee pain, bilateral 08/06/2018   Constipation 08/06/2018   Right lumbar radiculopathy 04/10/2018   Dysphagia 10/09/2017   Chronic cough 07/16/2017   GAD (generalized anxiety disorder) 04/28/2016   Chronic insomnia 03/07/2016   Left Achilles tendinitis 12/08/2015   Parkinson's disease 08/25/2015   Advanced care planning/counseling discussion 05/26/2015   Family circumstance 02/23/2015   Encounter for general adult medical examination with abnormal findings 05/21/2014   MDD (major depressive disorder), single episode, moderate ([facility]) 11/12/2013   DDD (degenerative disc disease), lumbar 05/31/2013   Medicare annual wellness visit, subsequent 05/20/2013   HLD (hyperlipidemia) 05/20/2013   Vitamin D deficiency 05/09/2013   Recurrent UTI 01/03/2011   Rash and other nonspecific skin eruption 05/11/2010   GERD 02/22/2010   CAD (coronary artery disease) 01/25/2010   Osteoporosis 11/10/2009   Orthostatic hypotension 12/06/2008   Anemia 09/25/2007   Vitamin B12 deficiency 05/03/2007   Fibromyalgia 05/03/2007    ONSET DATE: 06/28/2022  REFERRING DIAG: Parkinson's, S/P lumbar surgery  THERAPY DIAG:  Parkinson's disease with dyskinesia, unspecified whether manifestations fluctuate  Muscle weakness (generalized)  Difficulty in walking, not elsewhere classified  Rationale for Evaluation and Treatment Rehabilitation  SUBJECTIVE:                                                                                                                                                                                               SUBJECTIVE STATEMENT:    Reports had a day last week that she was just not able to do much, reports one of  those days  Pt accompanied by: self  PERTINENT HISTORY: see above, did have lumbar surgery in June, has a deep brain stimulator for movement disorder  PAIN:  Are you having pain? Yes, 6410 LB  PRECAUTIONS: None  WEIGHT BEARING RESTRICTIONS No  FALLS: Has patient fallen in last 6 months? No  LIVING ENVIRONMENT: Lives with: lives with their family Lives in: House/apartment Stairs: Yes: Internal: 12 steps; can reach both Has following equipment at home: Single point cane and Walker - 4 wheeled  PLOF: Independent, does some housework  PATIENT GOALS:  be stronger, walk longer and better  OBJECTIVE:   COGNITION: Overall cognitive status: Within functional limits for tasks assessed   SENSATION: WFL Palpation she is very tender and tight in the lumbar area  POSTURE: rounded shoulders, forward head, and decreased lumbar lordosis  LOWER EXTREMITY ROM:    Motions are WFL's but she has poor control of the right LE, weakness and dyskinesia  LOWER EXTREMITY MMT:    MMT Right Eval Left Eval Right and left 12/05/22  Hip flexion 3+ 4 3+  Hip extension 4- 4 3+  Hip abduction 4- 4 3+  Hip adduction 4- 4 3+  Hip internal rotation     Hip external rotation     Knee flexion 3+ 4 3+  Knee extension 3+ 4 3+  Ankle dorsiflexion 4- 4- 4-  Ankle plantarflexion     Ankle inversion     Ankle eversion 3+ 4-   (Blank rows = not tested)  TRANSFERS:  Has to use hands, if not falls back into chair  GAIT: Gait pattern: has extraneous motions of the right arm and the right leg and the shoulders and head, off balance at times but typically corrects on own, right LE tends to circumduct and has decreased control of the knee and the ankle Distance walked: 400 feet Assistive device utilized: None Level of assistance: Complete Independence Comments: on stairs does one at a  time  FUNCTIONAL TESTs:  5 times sit to stand: 21 seconds  10/17/22 13.38 sec but decreased eccentric conrtrol.  12/05/22 = 55 seconds with back pain and definite need for hands 5XSTS: CGA due to LOB 15 seconds Timed up and go (TUG): 17 seconds  10/17/22 9.2 sec without AD, 1,9,24 with SPC = 42 seconds TUG 12/27/22:  9 seconds no device BERG balance 39/56  10/17/22 45/56  TODAY'S TREATMENT:  12/27/22 Nustep level 5 x 6 minutes Volleyball  [place]one toe touches on and off airex Walking direction changes TUG and 5XSTS as noted above 4" step up and overs Airex balance beam side stepping, got dizzy with this 2 laps walking inside working on speed and steadiness, CGA needed at times  UBE level 2 x 4 minutes Bike level 4 x 5 minutes Discussion with her about safety and going slower at times as she lost balance a few times with her doing fast turns   12/19/22  Nustep L 5 34mn Standing hip ext and abd 10 x each leg with UE support and red tband- abd difficult Resisted gait 20# 4 way 4 x each- CGA but no LOB Black stand trunk ext 2 sets 10 Seated row and lat pull 20# 2 sets 10 Leg Press and calf raises 20# 2 sets 12 ( for leg press seat on #8 any lower unabl to press up) Wt ball standing OH trunk ext and rotation 10 x each STS with wt ball press Supine core stab 10 min  12/12/22 NuStep L5 x58mns  STS 2x10 Feet on ball K2C, trunk rotation, small bridge x10 Leg extension 5# 2x10 Leg curls 15# 2x10 Box taps 6"  Volleyball hits sitting and then standing  Standing rows and ext red 2x10    12/05/22 TUG with SPC 42 seconds 5XSTS 55 seconds needing to use hands and struggling Issued HEP Talked about body mechanics for up and down from sitting and lying down as well as bed mobility MHP/IFC to the low back in sitting   10/17/22 Nustep L 5 6 min Func testing performed 5 times sit to stand: 21 seconds  10/17/22 13.38 sec but decreased eccentric conrtrol Timed up and go (TUG): 17 seconds   10/17/22 9.2 sec without AD BERG balance 39/56  10/17/22 45/56 UBE L 3 2 min fwd/2 min back Resisted gait 4 ways 4 x 30# CG-min A HS curls 25# 2 sets 10 Knee ext 15# 2 sets 10 Side stepping in and out of 6 inch box on foam pads 6 inch alt box taps on foam mat 20 x 2 set s-min A    10/03/22 Nustep L 5 637m Bike L 4 75m22mUBE L 3 75mi41mesisted gait 4 ways 4 x 30# Black bar 20x heel raise/toe raise 6 inch step up 10 x each leg from airex 6 inch step up and over- min A with 2 LOB requiring assistance to prevent fall STS on airex with wt ball toss 2 sets 5- good righting reaction but LE weakness noted Tandem gait fwd and back on foam beam in // bars 5 x- occasional UE use Side stepping foam beam // bars 5 x-occasional UE use HS curls 25# 2 sets 10 Knee ext 15# 2 sets 10  PATIENT EDUCATION: Education details: POC Person educated: Patient and Child(ren) Education method: Explanation Education comprehension: verbalized understanding   HOME EXERCISE PROGRAM: Advised to walk 10 - 20 minutes a day Access Code: [email or web address] URL: [email or web address] Date: 12/05/2022 Prepared by: MichLum Babeercises - Supine Lower Trunk Rotation  - 2 x daily - 7 x weekly - 1 sets - 10 reps - 10 hold - Hooklying Single Knee to Chest Stretch  - 2 x daily - 7 x weekly - 1 sets - 10 reps - 10 hold - Supine Double Knee to Chest  - 2 x daily - 7 x weekly - 1 sets - 10 reps - 10 hold   GOALS: Goals reviewed with patient? Yes  SHORT TERM GOALS: Target date: 09/18/22  Independent with initial HEP  Goal status:met   LONG TERM GOALS: Target date: 11/27/22  Independent with advanced HEP Goal status: progressing    2.  Decrease TUG time to 12 seconds Goal status: regressed and was at 42 seconds on 12/05/22  3.  Increase BERG balance test score to 46/56 Goal status:ongoing 10/17/22 45  4.  Increase right LE strength to 4-/5 Goal status:progressing regressed to 3+ 12/05/22  5.  Get  up from sitting without using hands Goal status regressed has to have hands to get up  ASSESSMENT:  CLINICAL IMPRESSION:   PAtient without falls recently does report some LBP, she reports last week she had a bad parkinson's day and could not do much, reports today is similar.  Many times today she was unsafe with fast turns and had LOB requiring CGA, she did feel dizzy with the airex balance beam OBJECTIVE IMPAIRMENTS Abnormal gait, decreased activity tolerance, decreased balance, decreased coordination, decreased endurance, decreased mobility, difficulty walking, decreased  ROM, decreased strength, increased muscle spasms, impaired flexibility, improper body mechanics, postural dysfunction, and pain.   REHAB POTENTIAL: Good  CLINICAL DECISION MAKING: Stable/uncomplicated  EVALUATION COMPLEXITY: Low  PLAN: PT FREQUENCY: 1-2x/week  PT DURATION: 12 weeks  PLANNED INTERVENTIONS: Therapeutic exercises, Therapeutic activity, Neuromuscular re-education, Balance training, Gait training, Patient/Family education, Self Care, Joint mobilization, Stair training, Electrical stimulation, Spinal mobilization, Cryotherapy, Moist heat, and Manual therapy  PLAN FOR NEXT SESSION: continue to work on strength and overall function with the balance and the back pain   Sumner Boast, PT 12/27/2022, 10:22 [facility] [place]. [place], [place], [address] Phone: [phone]   Fax:  [phone]

## 2022-12-28 MED ORDER — CLONAZEPAM 0.5 MG PO TABS: ORAL_TABLET | ORAL | 0 refills | Status: DC

## 2022-12-28 NOTE — Telephone Encounter (Signed)
ERx 

## 2022-12-28 NOTE — Telephone Encounter (Signed)
Name of Medication: Clonazepam Name of Pharmacy: [facility] [address] Last Venida Jarvis or Written Date and Quantity: 10/29/22, #45 Last Office Visit and Type: 04/26/22, pre-op eval Next Office Visit and Type: 01/10/23, CPE Last Controlled Substance Agreement Date: 04/28/16 Last UDS: 04/28/16

## 2022-12-31 ENCOUNTER — Other Ambulatory Visit: Payer: Self-pay | Admitting: Family Medicine

## 2022-12-31 DIAGNOSIS — D649 Anemia, unspecified: Secondary | ICD-10-CM

## 2022-12-31 DIAGNOSIS — G20B1 Parkinson's disease with dyskinesia, without mention of fluctuations: Secondary | ICD-10-CM

## 2022-12-31 DIAGNOSIS — E559 Vitamin D deficiency, unspecified: Secondary | ICD-10-CM

## 2022-12-31 DIAGNOSIS — E785 Hyperlipidemia, unspecified: Secondary | ICD-10-CM

## 2022-12-31 DIAGNOSIS — E538 Deficiency of other specified B group vitamins: Secondary | ICD-10-CM

## 2023-01-01 ENCOUNTER — Ambulatory Visit: Payer: Medicare Other

## 2023-01-02 ENCOUNTER — Ambulatory Visit: Payer: Medicare Other | Admitting: Physical Therapy

## 2023-01-02 ENCOUNTER — Ambulatory Visit: Payer: Medicare Other | Admitting: Speech Pathology

## 2023-01-03 ENCOUNTER — Other Ambulatory Visit (INDEPENDENT_AMBULATORY_CARE_PROVIDER_SITE_OTHER): Payer: Medicare Other

## 2023-01-03 DIAGNOSIS — E538 Deficiency of other specified B group vitamins: Secondary | ICD-10-CM | POA: Diagnosis not present

## 2023-01-03 DIAGNOSIS — E559 Vitamin D deficiency, unspecified: Secondary | ICD-10-CM

## 2023-01-03 DIAGNOSIS — E785 Hyperlipidemia, unspecified: Secondary | ICD-10-CM

## 2023-01-03 DIAGNOSIS — D649 Anemia, unspecified: Secondary | ICD-10-CM

## 2023-01-03 LAB — LIPID PANEL
Cholesterol: 227 mg/dL — ABNORMAL HIGH (ref 0–200)
HDL: 58.1 mg/dL (ref >39.00)
LDL Cholesterol: 147 mg/dL — ABNORMAL HIGH (ref 0–99)
NonHDL: 168.79
Total CHOL/HDL Ratio: 4
Triglycerides: 109 mg/dL (ref 0.0–149.0)
VLDL: 21.8 mg/dL (ref 0.0–40.0)

## 2023-01-03 LAB — CBC WITH DIFFERENTIAL/PLATELET
Basophils Absolute: 0.1 10*3/uL (ref 0.0–0.1)
Basophils Relative: 1.1 % (ref 0.0–3.0)
Eosinophils Absolute: 0.1 10*3/uL (ref 0.0–0.7)
Eosinophils Relative: 2.9 % (ref 0.0–5.0)
HCT: 37.8 % (ref 36.0–46.0)
Hemoglobin: 12.2 g/dL (ref 12.0–15.0)
Lymphocytes Relative: 43.9 % (ref 12.0–46.0)
Lymphs Abs: 2.1 10*3/uL (ref 0.7–4.0)
MCHC: 32.2 g/dL (ref 30.0–36.0)
MCV: 87 fl (ref 78.0–100.0)
Monocytes Absolute: 0.3 10*3/uL (ref 0.1–1.0)
Monocytes Relative: 7.2 % (ref 3.0–12.0)
Neutro Abs: 2.2 10*3/uL (ref 1.4–7.7)
Neutrophils Relative %: 44.9 % (ref 43.0–77.0)
Platelets: 262 10*3/uL (ref 150.0–400.0)
RBC: 4.34 Mil/uL (ref 3.87–5.11)
RDW: 13.9 % (ref 11.5–15.5)
WBC: 4.9 10*3/uL (ref 4.0–10.5)

## 2023-01-03 LAB — COMPREHENSIVE METABOLIC PANEL
ALT: 9 U/L (ref 0–35)
AST: 13 U/L (ref 0–37)
Albumin: 4.3 g/dL (ref 3.5–5.2)
Alkaline Phosphatase: 62 U/L (ref 39–117)
BUN: 15 mg/dL (ref 6–23)
CO2: 29 mEq/L (ref 19–32)
Calcium: 9.1 mg/dL (ref 8.4–10.5)
Chloride: 103 mEq/L (ref 96–112)
Creatinine, Ser: 0.68 mg/dL (ref 0.40–1.20)
GFR: 85.73 mL/min (ref >60.00)
Glucose, Bld: 92 mg/dL (ref 70–99)
Potassium: 3.5 mEq/L (ref 3.5–5.1)
Sodium: 140 mEq/L (ref 135–145)
Total Bilirubin: 0.4 mg/dL (ref 0.2–1.2)
Total Protein: 6.9 g/dL (ref 6.0–8.3)

## 2023-01-03 LAB — VITAMIN D 25 HYDROXY (VIT D DEFICIENCY, FRACTURES): VITD: 32.92 ng/mL (ref 30.00–100.00)

## 2023-01-03 LAB — VITAMIN B12: Vitamin B-12: 605 pg/mL (ref 211–911)

## 2023-01-09 ENCOUNTER — Encounter: Payer: Self-pay | Admitting: Physical Therapy

## 2023-01-09 ENCOUNTER — Ambulatory Visit: Payer: Medicare Other | Admitting: Speech Pathology

## 2023-01-09 ENCOUNTER — Encounter: Payer: Self-pay | Admitting: Speech Pathology

## 2023-01-09 ENCOUNTER — Ambulatory Visit: Payer: Medicare Other | Attending: Neurology | Admitting: Physical Therapy

## 2023-01-09 ENCOUNTER — Encounter: Payer: Self-pay | Admitting: Pharmacist

## 2023-01-09 DIAGNOSIS — G20B1 Parkinson's disease with dyskinesia, without mention of fluctuations: Secondary | ICD-10-CM | POA: Diagnosis not present

## 2023-01-09 DIAGNOSIS — M5459 Other low back pain: Secondary | ICD-10-CM | POA: Insufficient documentation

## 2023-01-09 DIAGNOSIS — R262 Difficulty in walking, not elsewhere classified: Secondary | ICD-10-CM | POA: Diagnosis not present

## 2023-01-09 DIAGNOSIS — M6281 Muscle weakness (generalized): Secondary | ICD-10-CM | POA: Insufficient documentation

## 2023-01-09 DIAGNOSIS — R471 Dysarthria and anarthria: Secondary | ICD-10-CM | POA: Diagnosis not present

## 2023-01-09 NOTE — Progress Notes (Signed)
[name])                                            [place] Team                                        Statin Quality Measure Assessment    01/09/2023  [name] [birth year] [id number]  Per review of chart and payor information, patient has a diagnosis of cardiovascular disease but is not currently filling a statin prescription.  This places patient into the [facility] (Statin Use In Patients with Cardiovascular Disease) measure for CMS.    I could not find any documentation of previous trial of a statin or a history of statin intolerance.      Component Value Date/Time   CHOL 227 (H) 01/03/2023 0846   TRIG 109.0 01/03/2023 0846   HDL 58.10 01/03/2023 0846   CHOLHDL 4 01/03/2023 0846   VLDL 21.8 01/03/2023 0846   LDLCALC 147 (H) 01/03/2023 0846   LDLDIRECT 105.5 07/20/2010 1146     Please consider ONE of the following recommendations:  Initiate high intensity statin Atorvastatin 77m once daily, #90, 3 refills   Rosuvastatin 253monce daily, #90, 3 refills    Initiate moderate intensity  statin with reduced frequency if prior  statin intolerance 1x weekly, #13, 3 refills   2x weekly, #26, 3 refills   3x weekly, #39, 3 refills    Code for past statin intolerance  (required annually)  Provider Requirements: Must asociate code during an office visit or telehealth encounter   Drug Induced Myopathy G72.0   Myalgia M79.1   Myositis, unspecified M60.9   Myopathy, unspecified G72.9   Rhabdomyolysis M62.82     TiLoretha BrasilPharmD [employer] Direct Dial: [phone]

## 2023-01-09 NOTE — Therapy (Signed)
OUTPATIENT SPEECH LANGUAGE PATHOLOGY TREATMENT & RECERTIFICATION   Patient Name: [name] MRN: [id number] DOB:March 03, 1946, 77 y.o., female Today's Date: 12/19/2022  PCP: [name], MD REFERRING PROVIDER: Alonza Bogus, DO   End of Session - 12/19/22 1013     Visit Number 7    Number of Visits --   Error 15 to 25 sessions   Date for SLP Re-Evaluation 02/04/23    Authorization - Visit Number 3    Authorization - Number of Visits 17    SLP Start Time 1011    SLP Stop Time  [id number]    SLP Time Calculation (min) 40 min    Activity Tolerance Patient tolerated treatment well             Past Medical History:  Diagnosis Date   Allergy    ANEMIA-NOS 09/25/2007   Anxiety    Arthritis    B12 DEFICIENCY 05/03/2007   Blood transfusion without reported diagnosis    CAD (coronary artery disease) 01/2010   MI, Nishan   Cardiomyopathy ([place]) 02/08/2010   H/o this 2012 after urosepsis, no recurrence.    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   OSTEOPOROSIS 08/2009   bisphosphonate on hold 2/2 dysphagia, on reclast done in August each year   Parkinson's disease 08/25/2015   Dx Dr [name] 07/2015    Pneumonia    PONV (postoperative nausea and vomiting)    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   Takotsubo cardiomyopathy 2008   due to E coli urosepsis   Past Surgical History:  Procedure Laterality Date   ABDOMINAL HYSTERECTOMY  1970s    IUD infection - first partial then with oophorectomy (cysts), complication - low blood pressure   CATARACT EXTRACTION Bilateral    CHOLECYSTECTOMY     complication - low blood pressure   COLONOSCOPY  06/2008   h/o polyps but latest WNL, rec rpt 10 yrs Olevia Perches)   COLONOSCOPY  11/2018   multiple TAs (10 polyps total), rpt 2 yrs Fuller Plan)   COLONOSCOPY  04/2021   multiple TAs, rpt 3 yrs Fuller Plan)   CYSTOSCOPY  12/2013   abx treatment for recurrent cystitis   DEXA  04/2013   T -2.9 @ femur, -1.6 @ spine   DEXA  04/2017   T -2.9 hip, -0.7 spine   ESOPHAGOGASTRODUODENOSCOPY  12/2017   WNL, regardless esophagus dilated, small HH Fuller Plan)   EYE SURGERY     LUMBAR LAMINECTOMY/DECOMPRESSION MICRODISCECTOMY Right 11/27/2019   Right Lumbar Four-Five foraminotomy;  Erline Levine, MD)   LUMBAR LAMINECTOMY/DECOMPRESSION MICRODISCECTOMY Right 05/16/2022   Procedure: OPEN LUMBAR LAMINECTOMY, RT, L45 W/LATERAL RECESS DECOMPRESSION;  Surgeon: Karsten Ro, DO;  Location: [place];  Service: Neurosurgery;  Laterality: Right;  3C   MINOR PLACEMENT OF FIDUCIAL N/A 06/30/2021   Procedure: MINOR PLACEMENT OF FIDUCIAL;  Surgeon: Erline Levine, MD;  Location: [place];  Service: Neurosurgery;  Laterality: N/A;  Minor room   PTOSIS REPAIR Bilateral 10/2020   Plastic Surgery   PULSE GENERATOR IMPLANT Right 07/14/2021   Procedure: UNILATERAL PULSE GENERATOR IMPLANT;  Surgeon: Erline Levine, MD;  Location: [place];  Service: Neurosurgery;  Laterality: Right;   SUBTHALAMIC STIMULATOR INSERTION Bilateral 07/07/2021   Procedure: Deep brain stimulator placement;  Surgeon: Erline Levine, MD;  Location: [place];  Service: Neurosurgery;  Laterality: Bilateral;   TUBAL LIGATION     UPPER GASTROINTESTINAL ENDOSCOPY     Patient Active Problem List   Diagnosis Date Noted   Lumbar radiculopathy, chronic 05/16/2022   Pre-op evaluation 04/26/2022   CAP (community acquired pneumonia) 03/29/2022   Localized swelling of chest wall  12/23/2021   Other fatigue 11/25/2021   Sinus pain 11/25/2021   Lymphadenopathy 11/25/2021   Parkinson's disease with dyskinesia 05/28/2021   Closed fracture of rib of right side with routine healing 05/28/2021   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   Left shoulder pain 06/19/2020   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   Degenerative lumbar spinal stenosis 11/27/2019   Knee pain, bilateral 08/06/2018   Constipation 08/06/2018   Right lumbar radiculopathy 04/10/2018   Dysphagia 10/09/2017   Chronic cough 07/16/2017   GAD (generalized anxiety disorder) 04/28/2016   Chronic insomnia 03/07/2016   Left Achilles tendinitis 12/08/2015   Parkinson's disease 08/25/2015   Advanced care planning/counseling discussion 05/26/2015   Family circumstance 02/23/2015   Encounter for general adult medical examination with abnormal findings 05/21/2014   MDD (major depressive disorder), single episode, moderate ([place]) 11/12/2013   DDD (degenerative disc disease), lumbar 05/31/2013   Medicare annual wellness visit, subsequent 05/20/2013   HLD (hyperlipidemia) 05/20/2013   Vitamin D deficiency 05/09/2013   Recurrent UTI 01/03/2011   Rash and other nonspecific skin eruption 05/11/2010   GERD 02/22/2010   CAD (coronary artery disease) 01/25/2010   Osteoporosis 11/10/2009   Orthostatic hypotension 12/06/2008   Anemia 09/25/2007   Vitamin B12 deficiency 05/03/2007   Fibromyalgia 05/03/2007    ONSET DATE: Script written September 2023  REFERRING DIAG: G20 (ICD-10-CM) - Parkinson's disease   THERAPY DIAG:  Dysarthria and anarthria  Rationale for Evaluation and Treatment Rehabilitation  SUBJECTIVE:   SUBJECTIVE STATEMENT: "I've been doing them once a day, sometimes twice." Pt accompanied by: self  PERTINENT HISTORY: PMHx significant for Parkinsons disease w/ DBS 06/2021, Hx of orthostatic hypotension,  anxiety/depression, RUE/RLE reflex sympathetic dystrophy, osteoporosis, lupus, fibromyalgia, CHF and OA. Lumbar sx June 2023. Pt with evaluation for voice/dysarthria in Oct 2022 but did not return after eval due to medical reason.  PAIN:  Are you having pain?  Yes 5/10  Lower back  Tramadol   LIVING ENVIRONMENT: Lives with: lives with their family (son) Lives in: House/apartment  PLOF:  Level of assistance: Independent with ADLs, Comment: does most housework Employment: Retired  PATIENT GOALS: Talk louder  OBJECTIVE:   DIAGNOSTIC FINDINGS:  Clinical Impression - MBSS   05-10-22             Patient presents with a very mild oropharyngeal dysphagia with primary impairments being delays in anterior to posterior transit of solid texture boluses in oral cavity and mildly delayed pharyngeal peristalsis of puree and mechanical soft solids. She did exhibit mildly delayed swallow initiation of mechanical soft solids at level of vallecular sinus and trace residuals above UES opening. No other pharyngeal residuals observed with any of  the tested consistencies (thin liquids, puree solids, mechanical soft solids, 1/2 barium tablet. No penetration or aspiration observed with liquids or solids. Esophageal sweep did not reveal significant amount of barium stasis and esophageal transit appeared WFL. (this is an improvement as compared to 2018 MBS). Patient would cough after most swallows in absence of any observed penetration or aspiration. She did also have a cough prior to any PO's being given and when asked most recent time she ate, she reported that she has not had anything to eat or drink since last night. SLP reviewed MBS from 2018 which reports "no airway compromise despite frequent coughing immediately post-swallow". Unfortunately, results of this MBS do not explain patient's frequent coughing that occurs immediately after swallow PO's. 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 Pt reports she has been trying to get louder when speaking to her her kids. She reported she attempted to sing and it was "louder" than when she was speaking. SLP explained this could be 2/2 to breath support.   Averaged /ah/: 91 dB  Completed unstructured conversation regarding cooking. Pt vocal intensity was within 62-67 dB range without cueing from SLP. Will continue to monitor.  SLP encouraged patient to continue working on her exercises at home, as patient reports she has not been practicing as often as she should.   Patient reports she has had occasional coughing with liquids.  SLP has not observed any throat clearing or coughing while patient drinks intermittently throughout voice treatment.  SLP recommended decreasing distractions and continuing to take small sips to reduce the risk of aspiration.   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 Patient averaged 87 dB for sustained phonation /ah/ with cueing for breathing and speech clarity by dropping the jaw.  Patient required mod to max assist verbal cues for sequencing diaphragmatic breathing.  Patient continues to have difficulty with scales.  SLP provided video example of person completing skills and attempted to have patient match scales using "dodah" vs. Ah.  Upon exiting treatment room, patient saw physical therapist and utilized her loud and clear voice to say, " BYE MIKE!" Will continue with modified LSVT treatment.  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  Patient reports she has been completing her exercises at home 1-2 times a day.  Patient continues to require max assist verbal cues for sequencing diaphragmatic breathing.  Patient average 86.9 dB for sustained phonation /ah/) with cueing for breathing and releasing voice at the top of her breath.  In conversation patient was occasionally aphonic and required verbal cueing to increase vocal intensity.  SLP spoke with son on assisting her with breathing at home, as he is a Primary school teacher.  Will continue with modified LSVT treatment.  Patient reports she would like to decrease frequency to x 1/week.    12/12/22: Pt reported she worked on [employer] for breathing and loud /a/ 1-2x day for 7 days. Pt was subjectively louder upon entry to room today. She reports sometimes that when people call, they cannot hear her. Finished education on exercises for voice.   /ah/: averaged 88-90 dB  with cueing for breath support and sequencing  Required max cueing for diaphragmatic breathing Low to high glides were easier than high to low glides. Could not put both together. Pt was more successful when stepping the scale.   Continues to demonstrate tension in neck and shoulders. SLP demonstrated use of a mirror for visual feedback.   12/05/22: Pt was seen for the first time since November. She reports that she has been busy with a move and had a fall. She reported she lost her functional phrases. SLP suggested making new phrases this session. 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 Began edu on homework (diaphragmatic breathing and loud /a/). Pt required max cueing for abdominal breathing.   Good morning! 2. Are you ready for school? 3. Have a good day!  4. I love you 5. Have you got any laundry?  6. Are we going anywhere today?  7. Have you eaten?  8. Does anybody have a doctor's appointment today? 9. How was your day?  10.What's for dinner?  Coversational loudness: 59 dB   Initial "hey": 65 dB With cueing mid 70s dB  10/17/22: Gracemary has done loud "hey"s at home 5/14 days since last session. SLP told pt that it was imperative she complete loud "hey" x10, BID. Pt going on hold today until January 2024 due to moving into new house and the holidays.  She forgot her 10 everyday sentences today again - SLP told pt to bring them next session and then we can copy them and leave them here. Loud "hey" today at low-mid 80s with initial cue for loudness. In conversation pt's average was mid-upper 60s dB, but pt had rare rushes of speech which decr'd overall intelligibility. In paragraph reading, pt averaged low 70s dB, but again demonstrated rushes of speech. In addition SLP encouraged pt to overarticulate when reading to minimize impact of rushes of speech. Pt intelligibility improved when she did both of these, to 95-100%.   10/03/22: SLP had pt start with "hey"s and produced mid-upper 70s dB x2. SLP provided pt a model x3 and she incr'd productions to low-mid 80s dB. She req'd usual mod cues for loudness and usual mod-max cues for breath support prior to each "hey!". Pt has done loud "hey" at least once/day, but has not read out loud for 30 seconds. SLP told pt to perform "hey" 10 reps BID, and read out loud 30 seconds x4 reps. Pt's "everyday sentences" had 6/8 that were really said everyday. SLP hjad pt read these with LOUD voice and averaged upper 70s dB. She req'd usual mod A for loudness faded to occasional min A, and occasional min-mod A for breath support prior to each sentence. In reading paragraphs, Kaisa averaged upper 60s-low 70s dB and said she felt like she was talking too loudly. SLP used auditory feedback (recorded her reading) to prove to her that her volume was WNL. 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  10/24/23Estill Bamberg has not done small sips at home.   Pt started with "hey"s and produced mid-upper 70s dB x2. SLP provided pt a model and she incr'd productions to mid 37s dB. In divergent naming tasks pt req'd rare min A for louder speech, and in semi-structured sentence response tasks pt req'd rare min A for loudness and loudness decay. SLP during this time reminded pt that her volume was normally loud (  70-72 dB) and that was the loudness she produced prior to Parkinson's (PD).  SLP gave pt homework to generate 10 everyday sentences, to cont with HEY, and the reading (if she can do so).  09/12/22: SLP discussed results of evaluation with pt, and educated pt re: loud "hey", and her work to do at home - see pt instructions   PATIENT EDUCATION: Education details: see above in "today's treatment" Person educated: Patient Education method: Explanation, Demonstration, Verbal cues, and Handouts Education comprehension: verbalized understanding, returned demonstration, verbal cues required, and needs further education  HOME EXERCISE PROGRAM: 10 loud "hey", read out loud for 30 seconds x4, BID   GOALS: Goals reviewed with patient? No  SHORT TERM GOALS: Target date: 01/05/23   pt will produce loud /a/ or "hey" with low 80s dB average in 3 sessions  Baseline:09-19-22, 10-17-22 Goal status: Not met  2.  pt will demo abdominal breathing at rest 75% of the time in 3 sessions  Baseline:  Goal status: Deferred - worked on volume only  3.  Monita will answer 15-20 mod complex "wh" questions with average volume in upper 60s dB in 3 sessions  Baseline:  Goal status: Not met  4.  Pt will complete HEP 75% between sessions Baseline:  Goal status: Not met   LONG TERM GOALS: Target date: 02/03/23  pt will demonstrate abdominal breathing in simple Q and A responses 75% of the time in 3 sessions  Baseline:  Goal status: Ongoing; goal not met due to attendance  2.  Adelie will produce speech volume in 3 minutes simple conversation with average  mid-upper 60s dB in 3 sessions  Baseline:  Goal status: Ongoing; goal not met due to attendance  3.  Pt will produce speech volume of upper 60s dB in 5 minutes simple conversation with occasional nonverbal cues in 3 sessions  Baseline:  Goal status: Ongoing; goal not met due to attendance  4.  Pt will improve PROM measurement when compared to initial session, when given in last week of speech therapy Baseline:  Goal status: Ongoing; goal not met due to attendance    5.  SLP to monitor pt's intake of liquids to ensure pulmonary safety    Baseline:    Goal Status: Ongoing; goal not met due to attendance  ASSESSMENT:  CLINICAL IMPRESSION: Pt decided to place tx on hold until January 2024 due to moving into a new home and the holidays. Pt's target dates for goals have been changed. Patient is a 77 y.o. female who was seen today for treatment and recertification of dysarthria/voice. Goals will be ongoing with new target dates for short term/long term goals as we were unable to meet due to pt time off. SLP to monitor/possibly target aspiration precautions for pt's liquid intake to ensure pulmonary safety.  OBJECTIVE IMPAIRMENTS: Objective impairments include dysarthria and voice disorder. These impairments are limiting patient from household responsibilities, ADLs/IADLs, and effectively communicating at home and in community.Factors affecting potential to achieve goals and functional outcome are  none .Marland Kitchen Patient will benefit from skilled SLP services to address above impairments and improve overall function.  REHAB POTENTIAL: Good  PLAN:  SLP FREQUENCY: 2x/week  SLP DURATION: 12 weeks  PLANNED INTERVENTIONS: Aspiration precaution training, Pharyngeal strengthening exercises, Diet toleration management , Cueing hierachy, Internal/external aids, Oral motor exercises, Functional tasks, SLP instruction and feedback, Compensatory strategies, and Patient/family education    [place],  CCC-SLP 12/19/2022, 10:14 AM

## 2023-01-09 NOTE — Therapy (Signed)
OUTPATIENT PHYSICAL THERAPY NEURO   Patient Name: [name] MRN: [id number] DOB:1946/07/04, 77 y.o., female Today's Date: 01/09/2023   PCP: Leary Roca PROVIDER: [name]   PT End of Session - 01/09/23 1015     Visit Number 13    Date for PT Re-Evaluation 02/07/23    Authorization Type [employer] Medicare    PT Start Time 1013    Activity Tolerance Patient tolerated treatment well    Behavior During Therapy [facility] for tasks assessed/performed              Past Medical History:  Diagnosis Date   Allergy    ANEMIA-NOS 09/25/2007   Anxiety    Arthritis    B12 DEFICIENCY 05/03/2007   Blood transfusion without reported diagnosis    CAD (coronary artery disease) 01/2010   MI, Nishan   Cardiomyopathy ([place]) 02/08/2010   H/o this 2012 after urosepsis, no recurrence.    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   OSTEOPOROSIS 08/2009   bisphosphonate on hold 2/2 dysphagia, on reclast done in August each year   Parkinson's disease 08/25/2015   Dx Dr [name] 07/2015    Pneumonia    PONV (postoperative nausea and vomiting)    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   Takotsubo cardiomyopathy 2008   due to E coli urosepsis   Past Surgical History:  Procedure Laterality Date   ABDOMINAL HYSTERECTOMY  1970s   IUD infection - first partial then with oophorectomy (cysts), complication - low blood pressure   CATARACT EXTRACTION Bilateral     CHOLECYSTECTOMY     complication - low blood pressure   COLONOSCOPY  06/2008   h/o polyps but latest WNL, rec rpt 10 yrs Olevia Perches)   COLONOSCOPY  11/2018   multiple TAs (10 polyps total), rpt 2 yrs Fuller Plan)   COLONOSCOPY  04/2021   multiple TAs, rpt 3 yrs Fuller Plan)   CYSTOSCOPY  12/2013   abx treatment for recurrent cystitis   DEXA  04/2013   T -2.9 @ femur, -1.6 @ spine   DEXA  04/2017   T -2.9 hip, -0.7 spine   ESOPHAGOGASTRODUODENOSCOPY  12/2017   WNL, regardless esophagus dilated, small HH Fuller Plan)   EYE SURGERY     LUMBAR LAMINECTOMY/DECOMPRESSION MICRODISCECTOMY Right 11/27/2019   Right Lumbar Four-Five foraminotomy;  Erline Levine, MD)   LUMBAR LAMINECTOMY/DECOMPRESSION MICRODISCECTOMY Right 05/16/2022   Procedure: OPEN LUMBAR LAMINECTOMY, RT, L45 W/LATERAL RECESS DECOMPRESSION;  Surgeon: Karsten Ro, DO;  Location: [place];  Service: Neurosurgery;  Laterality: Right;  3C   MINOR PLACEMENT OF FIDUCIAL N/A 06/30/2021   Procedure: MINOR PLACEMENT OF FIDUCIAL;  Surgeon: Erline Levine, MD;  Location: [place];  Service: Neurosurgery;  Laterality: N/A;  Minor room   PTOSIS REPAIR Bilateral 10/2020   Plastic Surgery   PULSE GENERATOR IMPLANT Right 07/14/2021   Procedure: UNILATERAL PULSE GENERATOR IMPLANT;  Surgeon: Erline Levine, MD;  Location: [place];  Service: Neurosurgery;  Laterality: Right;   SUBTHALAMIC STIMULATOR INSERTION Bilateral 07/07/2021   Procedure: Deep brain stimulator placement;  Surgeon: Erline Levine, MD;  Location: [place];  Service: Neurosurgery;  Laterality: Bilateral;   TUBAL LIGATION     UPPER GASTROINTESTINAL ENDOSCOPY     Patient Active Problem List   Diagnosis Date Noted   Lumbar radiculopathy, chronic 05/16/2022   Pre-op evaluation 04/26/2022   CAP (community acquired pneumonia) 03/29/2022   Localized swelling of chest wall 12/23/2021   Other fatigue 11/25/2021   Sinus pain 11/25/2021   Lymphadenopathy 11/25/2021   Closed fracture of rib of right side with  routine healing 05/28/2021   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   Left shoulder pain 06/19/2020   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   Degenerative lumbar spinal stenosis 11/27/2019   Knee pain, bilateral 08/06/2018   Constipation 08/06/2018   Right lumbar radiculopathy 04/10/2018   Dysphagia 10/09/2017   Chronic cough 07/16/2017   GAD (generalized anxiety disorder) 04/28/2016   Chronic insomnia 03/07/2016   Left Achilles tendinitis 12/08/2015   Parkinson's disease 08/25/2015   Advanced care planning/counseling discussion 05/26/2015   Family circumstance 02/23/2015   Encounter for general adult medical examination with abnormal findings 05/21/2014   MDD (major depressive disorder), single episode, moderate ([place]) 11/12/2013   DDD (degenerative disc disease), lumbar 05/31/2013   Medicare annual wellness visit, subsequent 05/20/2013   HLD (hyperlipidemia) 05/20/2013   Vitamin D deficiency 05/09/2013   Recurrent UTI 01/03/2011   Rash and other nonspecific skin eruption 05/11/2010   GERD 02/22/2010   CAD (coronary artery disease) 01/25/2010   Osteoporosis 11/10/2009   Orthostatic hypotension 12/06/2008   Anemia 09/25/2007   Vitamin B12 deficiency 05/03/2007   Fibromyalgia 05/03/2007    ONSET DATE: 06/28/2022  REFERRING DIAG: Parkinson's, S/P lumbar surgery  THERAPY DIAG:  Parkinson's disease with dyskinesia, unspecified whether manifestations fluctuate  Difficulty in walking, not elsewhere classified  Muscle weakness (generalized)  Other low back pain  Rationale for Evaluation and Treatment Rehabilitation  SUBJECTIVE:                                                                                                                                                                                              SUBJECTIVE STATEMENT:    Reports that she has had some stumbles but no  falls, reports that she remembers what I told her about standing and then turning and then stepping but she is impulsive and goes too fast.  Pt  accompanied by: self  PERTINENT HISTORY: see above, did have lumbar surgery in June, has a deep brain stimulator for movement disorder  PAIN:  Are you having pain? Yes, 6410 LB  PRECAUTIONS: None  WEIGHT BEARING RESTRICTIONS No  FALLS: Has patient fallen in last 6 months? No  LIVING ENVIRONMENT: Lives with: lives with their family Lives in: House/apartment Stairs: Yes: Internal: 12 steps; can reach both Has following equipment at home: Single point cane and Walker - 4 wheeled  PLOF: Independent, does some housework  PATIENT GOALS:  be stronger, walk longer and better  OBJECTIVE:   COGNITION: Overall cognitive status: Within functional limits for tasks assessed   SENSATION: WFL Palpation she is very tender and tight in the lumbar area  POSTURE: rounded shoulders, forward head, and decreased lumbar lordosis  LOWER EXTREMITY ROM:    Motions are WFL's but she has poor control of the right LE, weakness and dyskinesia  LOWER EXTREMITY MMT:    MMT Right Eval Left Eval Right and left 12/05/22  Hip flexion 3+ 4 3+  Hip extension 4- 4 3+  Hip abduction 4- 4 3+  Hip adduction 4- 4 3+  Hip internal rotation     Hip external rotation     Knee flexion 3+ 4 3+  Knee extension 3+ 4 3+  Ankle dorsiflexion 4- 4- 4-  Ankle plantarflexion     Ankle inversion     Ankle eversion 3+ 4-   (Blank rows = not tested)  TRANSFERS:  Has to use hands, if not falls back into chair  GAIT: Gait pattern: has extraneous motions of the right arm and the right leg and the shoulders and head, off balance at times but typically corrects on own, right LE tends to circumduct and has decreased control of the knee and the ankle Distance walked: 400 feet Assistive device utilized: None Level of assistance: Complete Independence Comments: on stairs does one  at a time  FUNCTIONAL TESTs:  5 times sit to stand: 21 seconds  10/17/22 13.38 sec but decreased eccentric conrtrol.  12/05/22 = 55 seconds with back pain and definite need for hands 5XSTS: CGA due to LOB 15 seconds Timed up and go (TUG): 17 seconds  10/17/22 9.2 sec without AD, 12/05/22 with SPC = 42 seconds TUG 12/27/22:  9 seconds no device, 01/09/23 = 10 seconds BERG balance 39/56  10/17/22 45/56  01/09/23=42/56  TODAY'S TREATMENT:  01/09/23 Nustep level 5 x 6 minutes Gait outside around the parking island with CGA TUG and BERG assessment Side step in pbars on the airex balance beam Mini tramp march and bounce Direction changes Feet on ball K2C, trunk rotation, small bridges and isometric abs Quadraped alternating arm and leg raise Obstacle course, stepping over objects Side stepping over objects Walking with sticks b/n feet for her to keep a wider stance and not scissor Figure 8's  12/27/22 Nustep level 5 x 6 minutes Volleyball  [place]one toe touches on and off airex Walking direction changes TUG and 5XSTS as noted above 4" step up and overs Airex balance beam side stepping, got dizzy with this 2 laps walking inside working on speed and steadiness, CGA needed at times  UBE level 2 x 4 minutes Bike level 4 x 5 minutes Discussion with her about safety and going slower at times as she lost balance a few times with her doing fast turns   12/19/22  Nustep L 5 51mn Standing hip ext and abd 10 x each leg with  UE support and red tband- abd difficult Resisted gait 20# 4 way 4 x each- CGA but no LOB Black stand trunk ext 2 sets 10 Seated row and lat pull 20# 2 sets 10 Leg Press and calf raises 20# 2 sets 12 ( for leg press seat on #8 any lower unabl to press up) Wt ball standing OH trunk ext and rotation 10 x each STS with wt ball press Supine core stab 10 min    12/12/22 NuStep L5 x4mns  STS 2x10 Feet on ball K2C, trunk rotation, small bridge x10 Leg extension 5# 2x10 Leg curls  15# 2x10 Box taps 6"  Volleyball hits sitting and then standing  Standing rows and ext red 2x10    12/05/22 TUG with SPC 42 seconds 5XSTS 55 seconds needing to use hands and struggling Issued HEP Talked about body mechanics for up and down from sitting and lying down as well as bed mobility MHP/IFC to the low back in sitting   10/17/22 Nustep L 5 6 min Func testing performed 5 times sit to stand: 21 seconds  10/17/22 13.38 sec but decreased eccentric conrtrol Timed up and go (TUG): 17 seconds  10/17/22 9.2 sec without AD BERG balance 39/56  10/17/22 45/56 UBE L 3 2 min fwd/2 min back Resisted gait 4 ways 4 x 30# CG-min A HS curls 25# 2 sets 10 Knee ext 15# 2 sets 10 Side stepping in and out of 6 inch box on foam pads 6 inch alt box taps on foam mat 20 x 2 set s-min A  PATIENT EDUCATION: Education details: POC Person educated: Patient and Child(ren) Education method: Explanation Education comprehension: verbalized understanding   HOME EXERCISE PROGRAM: Advised to walk 10 - 20 minutes a day Access Code: [email or web address] URL: [email or web address] Date: 12/05/2022 Prepared by: MLum Babe Exercises - Supine Lower Trunk Rotation  - 2 x daily - 7 x weekly - 1 sets - 10 reps - 10 hold - Hooklying Single Knee to Chest Stretch  - 2 x daily - 7 x weekly - 1 sets - 10 reps - 10 hold - Supine Double Knee to Chest  - 2 x daily - 7 x weekly - 1 sets - 10 reps - 10 hold   GOALS: Goals reviewed with patient? Yes  SHORT TERM GOALS: Target date: 09/18/22  Independent with initial HEP  Goal status:met   LONG TERM GOALS: Target date: 11/27/22  Independent with advanced HEP Goal status: progressing  01/09/23  2.  Decrease TUG time to 12 seconds Met 01/10/23 3.  Increase BERG balance test score to 46/56 Goal status:ongoing 01/09/23 42/56  4.  Increase right LE strength to 4-/5 Goal status:progressing regressed to 3+ 12/05/22  5.  Get up from sitting without using  hands Goal status progressing can do it at time but at times will have to use hands 01/09/23  ASSESSMENT:  CLINICAL IMPRESSION:   Patient progressing with her function, better balance demonstrated by decreased TUG time and increased BERG score, she is still impulsive at times and needs cues to slow down and take her time to stand, turn and then go, she will occasionally scissor the feet and or cut a corner to short and hit an object. OBJECTIVE IMPAIRMENTS Abnormal gait, decreased activity tolerance, decreased balance, decreased coordination, decreased endurance, decreased mobility, difficulty walking, decreased ROM, decreased strength, increased muscle spasms, impaired flexibility, improper body mechanics, postural dysfunction, and pain.   REHAB POTENTIAL: Good  CLINICAL DECISION MAKING: Stable/uncomplicated  EVALUATION  COMPLEXITY: Low  PLAN: PT FREQUENCY: 1-2x/week  PT DURATION: 12 weeks  PLANNED INTERVENTIONS: Therapeutic exercises, Therapeutic activity, Neuromuscular re-education, Balance training, Gait training, Patient/Family education, Self Care, Joint mobilization, Stair training, Electrical stimulation, Spinal mobilization, Cryotherapy, Moist heat, and Manual therapy  PLAN FOR NEXT SESSION: Renewal performed, she is progressing, no recent falls just stumbles at times  Sumner Boast, PT 01/09/2023, 10:16 [facility] [place]. [place], [place], [address] Phone: [phone]   Fax:  [phone]

## 2023-01-10 ENCOUNTER — Encounter: Payer: Self-pay | Admitting: Family Medicine

## 2023-01-10 ENCOUNTER — Ambulatory Visit (INDEPENDENT_AMBULATORY_CARE_PROVIDER_SITE_OTHER): Payer: Medicare Other | Admitting: Family Medicine

## 2023-01-10 VITALS — BP 124/70 | HR 74 | Temp 97.2°F | Ht 64.5 in | Wt 154.1 lb

## 2023-01-10 DIAGNOSIS — K219 Gastro-esophageal reflux disease without esophagitis: Secondary | ICD-10-CM

## 2023-01-10 DIAGNOSIS — I951 Orthostatic hypotension: Secondary | ICD-10-CM

## 2023-01-10 DIAGNOSIS — Z0001 Encounter for general adult medical examination with abnormal findings: Secondary | ICD-10-CM | POA: Diagnosis not present

## 2023-01-10 DIAGNOSIS — Z0000 Encounter for general adult medical examination without abnormal findings: Secondary | ICD-10-CM

## 2023-01-10 DIAGNOSIS — E785 Hyperlipidemia, unspecified: Secondary | ICD-10-CM

## 2023-01-10 DIAGNOSIS — G20B1 Parkinson's disease with dyskinesia, without mention of fluctuations: Secondary | ICD-10-CM

## 2023-01-10 DIAGNOSIS — F321 Major depressive disorder, single episode, moderate: Secondary | ICD-10-CM

## 2023-01-10 DIAGNOSIS — R32 Unspecified urinary incontinence: Secondary | ICD-10-CM | POA: Diagnosis not present

## 2023-01-10 DIAGNOSIS — M797 Fibromyalgia: Secondary | ICD-10-CM

## 2023-01-10 DIAGNOSIS — E538 Deficiency of other specified B group vitamins: Secondary | ICD-10-CM

## 2023-01-10 DIAGNOSIS — R300 Dysuria: Secondary | ICD-10-CM | POA: Diagnosis not present

## 2023-01-10 DIAGNOSIS — F411 Generalized anxiety disorder: Secondary | ICD-10-CM

## 2023-01-10 DIAGNOSIS — M810 Age-related osteoporosis without current pathological fracture: Secondary | ICD-10-CM

## 2023-01-10 DIAGNOSIS — M5136 Other intervertebral disc degeneration, lumbar region: Secondary | ICD-10-CM

## 2023-01-10 DIAGNOSIS — Z7189 Other specified counseling: Secondary | ICD-10-CM

## 2023-01-10 DIAGNOSIS — E559 Vitamin D deficiency, unspecified: Secondary | ICD-10-CM

## 2023-01-10 DIAGNOSIS — M5416 Radiculopathy, lumbar region: Secondary | ICD-10-CM

## 2023-01-10 LAB — POC URINALSYSI DIPSTICK (AUTOMATED)
Bilirubin, UA: NEGATIVE
Blood, UA: NEGATIVE
Glucose, UA: NEGATIVE
Ketones, UA: NEGATIVE
Nitrite, UA: POSITIVE
Protein, UA: POSITIVE — AB
Spec Grav, UA: 1.015 (ref 1.010–1.025)
Urobilinogen, UA: 0.2 E.U./dL
pH, UA: 6 (ref 5.0–8.0)

## 2023-01-10 MED ORDER — CEPHALEXIN 500 MG PO CAPS: 500.0000 mg | ORAL_CAPSULE | Freq: Two times a day (BID) | ORAL | 0 refills | Status: DC

## 2023-01-10 MED ORDER — MIRABEGRON ER 25 MG PO TB24: 25.0000 mg | ORAL_TABLET | Freq: Every day | ORAL | 11 refills | Status: DC

## 2023-01-10 MED ORDER — SERTRALINE HCL 100 MG PO TABS: 100.0000 mg | ORAL_TABLET | Freq: Every day | ORAL | 3 refills | Status: DC

## 2023-01-10 NOTE — Patient Instructions (Addendum)
You are doing well today Cholesterol levels were elevated today - work on diet to help improve cholesterol levels - handout provided today.  You have UTI - urine culture sent. Push fluids, may use tylenol for discomfort as needed. Take keflex 557m twice daily for 1 week, let u[place]ore[place]now if ongoing symptoms after treatment.  May also restart Myrbetriq 231mdaily sent to local pharmacy.  Return as needed or in 6 months for follow up visit.

## 2023-01-10 NOTE — Progress Notes (Signed)
Patient ID: [name], female    DOB: 08-30-1946, 77 y.o.   MRN: [id number]  This visit was conducted in person.  BP 124/70   Pulse 74   Temp (!) 97.2 F (36.2 C) (Temporal)   Ht 5' 4.5" (1.638 m)   Wt 154 lb 2 oz (69.9 kg)   SpO2 97%   BMI 26.05 kg/m    CC: CPE/AMW Subjective:   HPI: [name] is a 77 y.o. female presenting on 01/10/2023 for Medicare Wellness (Pt accompanied by son, [name]. ) and Dysuria (C/o pain when urinating and urinary frequency. Sxs started 01/08/23.)   Did not see health advisor this year.   Hearing Screening   500Hz$  1000Hz$  2000Hz$  4000Hz$   Right ear 25 25 20 20  $ Left ear 20 25 20 25  $ Vision Screening - Comments:: Last eye exam, 10/2022.  [place] Office Visit from 01/10/2023 in [facility] [place]  PHQ-2 Total Score 3          01/10/2023   11:16 AM 09/25/2022    1:09 PM 08/11/2022    9:36 AM 05/01/2022    2:31 PM 10/31/2021    2:02 PM  Fall Risk   Falls in the past year? 0 0 0 1 1  Number falls in past yr:  0 0 1 0  Injury with Fall?  0 0 1 1  Follow up  Falls evaluation completed     Recently bought new house. Fall 10/2022 - legs gave out while moving into new house.   S/p DBS placement for PD followed by neurology and neurosurgery care.  Continues fludrocortisone 0.33m daily for PD related orthostatic hypotension. Midodrine caused HA, northera unaffordable.  Continues klonopin 1/2 tab in am and 1 tab at night.  Continues sertraline 1070mdaily.  She was treated for CAP with augmentin/azithromycin mid last year.    She underwent lumbar laminectomy (Dr Dawley) 04/2022 - recovered well. She continues PT also receiving speech therapy.   Several days of dysuria associated with urine urgency, frequency, nocturia, some lower abd pain and L flank pain.  No hematuria, fevers/chills, nausea/vomiting.  She's taking cranberry tablets.   Preventative: COLONOSCOPY Date: 06/2008 h/o polyps but latest WNL, rec rpt  10 yrs ([facility] COLONOSCOPY 11/2018 multiple TAs (10 polyps total), rpt 2 yrs ([facility] Colonoscopy 04/2021 - multiple TAs, rpt 3 yrs ([facility]  Well woman - s/p hysterectomy and oophorectomy.  DEXA Date: 04/2013 T -2.9 at femur, -1.6 at spine. Last reclast 06/2014.  DEXA 6/2018T score -2.9 hip  DEXA 10/2019 - T -2.8 hip  DEXA 09/2022 - T -3.1 L forearm, -2.6 L femur neck. Continues Q6m77moolia (last 09/21/2022). Continues cal/vit D regularly.  Lung cancer screen - not eligible  Flu yearly  COVSea Girt2021, 01/2020, no booster  Prevnar-13 04/2014, pneumovax 04/2015  Tdap 2012  Zostavax 2011  RSV - discussed Shingrix - discussed  Advanced directive 07/2021 - wants all children involved in her medical decision making but son [name] to lead decision. Doesn't want prolonged life support if terminal condition.  Seat belt use discussed.  Sunscreen use discussed. No changing moles on skin.  Non smoker  Alcohol - none Dentist - has dentures Eye exam q6 mo for glaucoma  Bowel - ongoing constipation. Miralax caused abd pain. Linzess was unaffordable. Takes PRN dulcolax.  Bladder - + mixed urge and stress incontinence previously saw Dr OttKarsten Ro  urology. Myrbetriq previously helpful but ran out - requests refill.   Lives with daughter, 1 dog. Widow of husband [name]) 2015. 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     Relevant past medical, surgical, family and social history reviewed and updated as indicated. Interim medical history since our last visit reviewed. Allergies and medications reviewed and updated. Outpatient Medications Prior to Visit  Medication Sig Dispense Refill   acetaminophen (TYLENOL) 650 MG CR tablet Take 2 tablets (1,300 mg total) by  mouth every 8 (eight) hours as needed for pain.     AMBULATORY NON FORMULARY MEDICATION Lift chair Dx:  G20 1 Device 0   Calcium Carbonate-Vitamin D (CALCIUM 600/VITAMIN D) 600-400 MG-UNIT chew tablet Chew 1 tablet by mouth daily.     carbidopa-levodopa (SINEMET IR) 25-100 MG tablet 1 po tid prn (Patient taking differently: Take 2 tablets by mouth in the morning and at bedtime.) 270 tablet 1   Cholecalciferol (VITAMIN D3) 25 MCG (1000 UT) CAPS Take 1 capsule (1,000 Units total) by mouth daily. 30 capsule    clonazePAM (KLONOPIN) 0.5 MG tablet Take 0.5 tablets (0.25 mg total) by mouth every morning AND 1 tablet (0.5 mg total) at bedtime. 45 tablet 0   clotrimazole-betamethasone (LOTRISONE) cream Apply 1 application topically daily. 30 g 0   cyanocobalamin (VITAMIN B12) 1000 MCG/ML injection INJECT 1 ML (1,000 MCG TOTAL) INTO THE MUSCLE EVERY 30 DAYS. 3 mL 2   denosumab (PROLIA) 60 MG/ML SOSY injection Inject 60 mg into the skin every 6 (six) months.     fludrocortisone (FLORINEF) 0.1 MG tablet TAKE 1 TABLET BY MOUTH EVERY DAY 30 tablet 1   Glycerin-Hypromellose-PEG 400 (ARTIFICIAL TEARS) 0.2-0.2-1 % SOLN Place 1 drop into both eyes daily as needed (dry eyes).     latanoprost (XALATAN) 0.005 % ophthalmic solution Place 1 drop into both eyes at bedtime.     pregabalin (LYRICA) 50 MG capsule Take 1 capsule (50 mg total) by mouth 3 (three) times daily. 90 capsule 1   SYRINGE-NEEDLE, DISP, 3 ML (BD SAFETYGLIDE SYRINGE/NEEDLE) 25G X 1" 3 ML MISC Use to inject vitamin B12 monthly. 50 each 0   traMADol (ULTRAM) 50 MG tablet TAKE 1 TABLET (50 MG TOTAL) BY MOUTH EVERY 6 (SIX) HOURS AS NEEDED FOR MODERATE PAIN 30 tablet 3   sertraline (ZOLOFT) 100 MG tablet TAKE 1 TABLET BY MOUTH DAILY 90 tablet 0   No facility-administered medications prior to visit.     Per HPI unless specifically indicated in ROS section below Review of Systems  Constitutional:  Negative for activity change, appetite change, chills,  fatigue, fever and unexpected weight change.  HENT:  Negative for hearing loss.   Eyes:  Negative for visual disturbance.  Respiratory:  Negative for cough, chest tightness, shortness of breath and wheezing.   Cardiovascular:  Negative for chest pain, palpitations and leg swelling.  Gastrointestinal:  Positive for constipation. Negative for abdominal distention, abdominal pain, blood in stool, diarrhea, nausea and vomiting.  Genitourinary:  Positive for dysuria, frequency and urgency. Negative for difficulty urinating and hematuria.  Musculoskeletal:  Negative for arthralgias, myalgias and neck pain.  Skin:  Negative for rash.  Neurological:  Negative for dizziness, seizures, syncope and headaches.  Hematological:  Negative for adenopathy. Does not bruise/bleed easily.  Psychiatric/Behavioral:  Negative for  dysphoric mood. The patient is not nervous/anxious.     Objective:  BP 124/70   Pulse 74   Temp (!) 97.2 F (36.2 C) (Temporal)   Ht 5' 4.5" (1.638 m)   Wt 154 lb 2 oz (69.9 kg)   SpO2 97%   BMI 26.05 kg/m   Wt Readings from Last 3 Encounters:  01/10/23 154 lb 2 oz (69.9 kg)  09/25/22 156 lb 6.4 oz (70.9 kg)  08/11/22 161 lb 6.4 oz (73.2 kg)      Physical Exam Vitals and nursing note reviewed.  Constitutional:      Appearance: Normal appearance. She is not ill-appearing.  HENT:     Head: Normocephalic and atraumatic.     Right Ear: Tympanic membrane, ear canal and external ear normal. There is no impacted cerumen.     Left Ear: Tympanic membrane, ear canal and external ear normal. There is no impacted cerumen.     Nose: Nose normal.     Mouth/Throat:     Mouth: Mucous membranes are moist.     Pharynx: Oropharynx is clear. No oropharyngeal exudate or posterior oropharyngeal erythema.  Eyes:     General:        Right eye: No discharge.        Left eye: No discharge.     Extraocular Movements: Extraocular movements intact.     Conjunctiva/sclera: Conjunctivae normal.      Pupils: Pupils are equal, round, and reactive to light.  Neck:     Thyroid: No thyroid mass or thyromegaly.     Vascular: No carotid bruit.  Cardiovascular:     Rate and Rhythm: Normal rate and regular rhythm.     Pulses: Normal pulses.     Heart sounds: Normal heart sounds. No murmur heard. Pulmonary:     Effort: Pulmonary effort is normal. No respiratory distress.     Breath sounds: Normal breath sounds. No wheezing, rhonchi or rales.  Abdominal:     General: Bowel sounds are normal. There is no distension.     Palpations: Abdomen is soft. There is no mass.     Tenderness: There is no abdominal tenderness. There is no guarding or rebound.     Hernia: No hernia is present.  Musculoskeletal:     Cervical back: Normal range of motion and neck supple. No rigidity.     Right lower leg: No edema.     Left lower leg: No edema.  Lymphadenopathy:     Cervical: No cervical adenopathy.  Skin:    General: Skin is warm and dry.     Findings: No rash.  Neurological:     General: No focal deficit present.     Mental Status: She is alert. Mental status is at baseline.     Comments:  Recall 3/3 Calculation 4/5 serial 3s  Psychiatric:        Mood and Affect: Mood normal.        Behavior: Behavior normal.       Results for orders placed or performed in visit on 01/10/23  POCT Urinalysis Dipstick (Automated)  Result Value Ref Range   Color, UA yellow    Clarity, UA cloudy    Glucose, UA Negative Negative   Bilirubin, UA negative    Ketones, UA negative    Spec Grav, UA 1.015 1.010 - 1.025   Blood, UA negative    pH, UA 6.0 5.0 - 8.0   Protein, UA Positive (A) Negative   Urobilinogen, UA  0.2 0.2 or 1.0 E.U./dL   Nitrite, UA positive    Leukocytes, UA Large (3+) (A) Negative   *Note: Due to a large number of results and/or encounters for the requested time period, some results have not been displayed. A complete set of results can be found in Results Review.   Lab Results   Component Value Date   CHOL 227 (H) 01/03/2023   HDL 58.10 01/03/2023   LDLCALC 147 (H) 01/03/2023   LDLDIRECT 105.5 07/20/2010   TRIG 109.0 01/03/2023   CHOLHDL 4 01/03/2023    Lab Results  Component Value Date   CREATININE 0.68 01/03/2023   BUN 15 01/03/2023   NA 140 01/03/2023   K 3.5 01/03/2023   CL 103 01/03/2023   CO2 29 01/03/2023       01/10/2023   11:16 AM 10/28/2021    1:27 PM 10/27/2020   10:31 AM 10/13/2019    9:32 AM 10/04/2018    8:47 AM  Depression screen PHQ 2/9  Decreased Interest 3 0 0 0 0  Down, Depressed, Hopeless 0 0 0 0 0  PHQ - 2 Score 3 0 0 0 0  Altered sleeping 2  2 0 2  Tired, decreased energy 2  1 0 0  Change in appetite 1  2 0 1  Feeling bad or failure about yourself  0  1 0 0  Trouble concentrating 0  1 0 0  Moving slowly or fidgety/restless 3  2 0 0  Suicidal thoughts 0  0 0 0  PHQ-9 Score 11  9 0 3  Difficult doing work/chores Not difficult at all   Not difficult at all Not difficult at all       01/10/2023   11:16 AM 10/27/2020   10:32 AM  GAD 7 : Generalized Anxiety Score  Nervous, Anxious, on Edge 1 1  Control/stop worrying 1 1  Worry too much - different things 2 1  Trouble relaxing 2 1  Restless 2 1  Easily annoyed or irritable 1 1  Afraid - awful might happen 0 0  Total GAD 7 Score 9 6  Anxiety Difficulty Somewhat difficult    Assessment & Plan:   Problem List Items Addressed This Visit     Medicare annual wellness visit, subsequent - Primary (Chronic)    I have personally reviewed the Medicare Annual Wellness questionnaire and have noted 1. The patient's medical and social history 2. Their use of alcohol, tobacco or illicit drugs 3. Their current medications and supplements 4. The patient's functional ability including ADL's, fall risks, home safety risks and hearing or visual impairment. Cognitive function has been assessed and addressed as indicated.  5. Diet and physical activity 6. Evidence for depression or mood  disorders The patients weight, height, BMI have been recorded in the chart. I have made referrals, counseling and provided education to the patient based on review of the above and I have provided the pt with a written personalized care plan for preventive services. Provider list updated.. See scanned questionairre as needed for further documentation. Reviewed preventative protocols and updated unless pt declined.       Encounter for general adult medical examination with abnormal findings (Chronic)    Preventative protocols reviewed and updated unless pt declined. Discussed healthy diet and lifestyle.       Advanced care planning/counseling discussion (Chronic)    Previously discussed.      Parkinson's disease (Chronic)    Appreciate neurology care s/p DBS placement.  Vitamin B12 deficiency    Continue monthly B12 shots at home.       Orthostatic hypotension    Continue fludrocortisone 0.40m daily.       GERD    Stable period off PPI      Fibromyalgia   Relevant Medications   sertraline (ZOLOFT) 100 MG tablet   Osteoporosis    Continues prolia and calcium/vit D supplementation. Latest DEXA showing persistent osteoporosis.       Vitamin D deficiency    Continue vit D 1000 IU daily.       HLD (hyperlipidemia)    Chronic, off meds. Levels elevated  - provided with low chol diet handout. The 10-year ASCVD risk score (Arnett DK, et al., 2019) is: 13.9%   Values used to calculate the score:     Age: [age]years     Sex: Female     Is Non-Hispanic African American: No     Diabetic: No     Tobacco smoker: No     Systolic Blood Pressure: [id number]     Is BP treated: No     HDL Cholesterol: 58.1 mg/dL     Total Cholesterol: 227 mg/dL       DDD (degenerative disc disease), lumbar   Dysuria    UA/micro suspicious for UTI - UCx sent, start keflex 507mBID x1 wk.       Relevant Orders   POCT Urinalysis Dipstick (Automated) (Completed)   Urine Culture   MDD  (major depressive disorder), single episode, moderate (HCC)    Continue sertraline, klonopin.       Relevant Medications   sertraline (ZOLOFT) 100 MG tablet   GAD (generalized anxiety disorder)    Continue sertraline, klonopin.       Relevant Medications   sertraline (ZOLOFT) 100 MG tablet   Right lumbar radiculopathy    S/p lumbar laminectomy 04/2022 (Dr Dawley)       Relevant Medications   sertraline (ZOLOFT) 100 MG tablet   Urinary incontinence    Restart myrbetriq which was previously effective.       Relevant Medications   mirabegron ER (MYRBETRIQ) 25 MG TB24 tablet     Meds ordered this encounter  Medications   sertraline (ZOLOFT) 100 MG tablet    Sig: Take 1 tablet (100 mg total) by mouth daily.    Dispense:  100 tablet    Refill:  3   mirabegron ER (MYRBETRIQ) 25 MG TB24 tablet    Sig: Take 1 tablet (25 mg total) by mouth daily.    Dispense:  30 tablet    Refill:  11   cephALEXin (KEFLEX) 500 MG capsule    Sig: Take 1 capsule (500 mg total) by mouth 2 (two) times daily.    Dispense:  14 capsule    Refill:  0    Orders Placed This Encounter  Procedures   Urine Culture   POCT Urinalysis Dipstick (Automated)    Patient Instructions  You are doing well today Cholesterol levels were elevated today - work on diet to help improve cholesterol levels - handout provided today.  You have UTI - urine culture sent. Push fluids, may use tylenol for discomfort as needed. Take keflex 50026mwice daily for 1 week, let us [place] if ongoing symptoms after treatment.  May also restart Myrbetriq 6m28mily sent to local pharmacy.  Return as needed or in 6 months for follow up visit.  Follow up plan: Return in about 6 months (around  07/11/2023) for follow up visit.  [name], MD

## 2023-01-12 ENCOUNTER — Encounter: Payer: Self-pay | Admitting: Family Medicine

## 2023-01-12 DIAGNOSIS — R32 Unspecified urinary incontinence: Secondary | ICD-10-CM

## 2023-01-12 DIAGNOSIS — N3946 Mixed incontinence: Secondary | ICD-10-CM | POA: Insufficient documentation

## 2023-01-12 HISTORY — DX: Unspecified urinary incontinence: R32

## 2023-01-12 LAB — URINE CULTURE
MICRO NUMBER:: 14564550
SPECIMEN QUALITY:: ADEQUATE

## 2023-01-12 NOTE — Assessment & Plan Note (Addendum)
Continues prolia and calcium/vit D supplementation. Latest DEXA showing persistent osteoporosis.

## 2023-01-12 NOTE — Assessment & Plan Note (Signed)

## 2023-01-12 NOTE — Assessment & Plan Note (Signed)
Chronic, off meds. Levels elevated  - provided with low chol diet handout. The 10-year ASCVD risk score (Arnett DK, et al., 2019) is: 13.9%   Values used to calculate the score:     Age: 77 years     Sex: Female     Is Non-Hispanic African American: No     Diabetic: No     Tobacco smoker: No     Systolic Blood Pressure: [id number] mmHg     Is BP treated: No     HDL Cholesterol: 58.1 mg/dL     Total Cholesterol: 227 mg/dL

## 2023-01-12 NOTE — Assessment & Plan Note (Signed)
Continue sertraline, klonopin.

## 2023-01-12 NOTE — Assessment & Plan Note (Signed)
S/p lumbar laminectomy 04/2022 (Dr Dawley)

## 2023-01-12 NOTE — Assessment & Plan Note (Addendum)
Appreciate neurology care s/p DBS placement.

## 2023-01-12 NOTE — Assessment & Plan Note (Signed)
Previously discussed.

## 2023-01-12 NOTE — Assessment & Plan Note (Signed)
UA/micro suspicious for UTI - UCx sent, start keflex 535m BID x1 wk.

## 2023-01-12 NOTE — Assessment & Plan Note (Signed)
Stable period off PPI

## 2023-01-12 NOTE — Assessment & Plan Note (Signed)
Continue vit D 1000 IU daily. 

## 2023-01-12 NOTE — Assessment & Plan Note (Signed)
Continue fludrocortisone 0.60m daily.

## 2023-01-12 NOTE — Assessment & Plan Note (Signed)
Continue monthly B12 shots at home.

## 2023-01-12 NOTE — Assessment & Plan Note (Signed)
Preventative protocols reviewed and updated unless pt declined. Discussed healthy diet and lifestyle.  

## 2023-01-12 NOTE — Assessment & Plan Note (Signed)
Restart myrbetriq which was previously effective.

## 2023-01-14 ENCOUNTER — Other Ambulatory Visit: Payer: Self-pay | Admitting: Family Medicine

## 2023-01-15 NOTE — Telephone Encounter (Signed)
Florinef Last filled:  12/20/22, #30 Last OV:  01/10/23, AWV Next OV:  07/11/23, 6 mo f/u

## 2023-01-16 NOTE — Telephone Encounter (Signed)
ERx 

## 2023-01-21 DIAGNOSIS — W1830XA Fall on same level, unspecified, initial encounter: Secondary | ICD-10-CM | POA: Diagnosis not present

## 2023-01-21 DIAGNOSIS — S2231XA Fracture of one rib, right side, initial encounter for closed fracture: Secondary | ICD-10-CM | POA: Diagnosis not present

## 2023-01-21 DIAGNOSIS — R0782 Intercostal pain: Secondary | ICD-10-CM | POA: Diagnosis not present

## 2023-01-23 ENCOUNTER — Ambulatory Visit: Payer: Medicare Other

## 2023-01-23 ENCOUNTER — Ambulatory Visit: Payer: Medicare Other | Admitting: Speech Pathology

## 2023-01-30 ENCOUNTER — Ambulatory Visit: Payer: Medicare Other | Admitting: Physical Therapy

## 2023-01-30 ENCOUNTER — Ambulatory Visit: Payer: Medicare Other | Admitting: Speech Pathology

## 2023-02-06 ENCOUNTER — Ambulatory Visit: Payer: Medicare Other | Admitting: Speech Pathology

## 2023-02-06 ENCOUNTER — Ambulatory Visit: Payer: Medicare Other | Admitting: Physical Therapy

## 2023-02-11 ENCOUNTER — Other Ambulatory Visit: Payer: Self-pay | Admitting: Family Medicine

## 2023-02-12 NOTE — Telephone Encounter (Signed)
Refill request Clonazepam Last office visit 01/10/23 Last refill 12/28/22 #45

## 2023-02-13 ENCOUNTER — Ambulatory Visit: Payer: Medicare Other | Admitting: Physical Therapy

## 2023-02-13 ENCOUNTER — Ambulatory Visit: Payer: Medicare Other | Admitting: Speech Pathology

## 2023-02-13 NOTE — Telephone Encounter (Signed)
ERx 

## 2023-02-19 ENCOUNTER — Encounter: Payer: Self-pay | Admitting: Neurology

## 2023-02-19 DIAGNOSIS — H401131 Primary open-angle glaucoma, bilateral, mild stage: Secondary | ICD-10-CM | POA: Diagnosis not present

## 2023-02-20 ENCOUNTER — Ambulatory Visit: Payer: Medicare Other | Admitting: Speech Pathology

## 2023-02-20 ENCOUNTER — Encounter: Payer: Self-pay | Admitting: Internal Medicine

## 2023-02-20 ENCOUNTER — Ambulatory Visit (INDEPENDENT_AMBULATORY_CARE_PROVIDER_SITE_OTHER): Payer: Medicare Other | Admitting: Internal Medicine

## 2023-02-20 ENCOUNTER — Ambulatory Visit: Payer: Medicare Other | Admitting: Physical Therapy

## 2023-02-20 VITALS — BP 122/70 | HR 74 | Temp 97.7°F | Ht 64.5 in | Wt 157.0 lb

## 2023-02-20 DIAGNOSIS — N390 Urinary tract infection, site not specified: Secondary | ICD-10-CM

## 2023-02-20 LAB — POC URINALSYSI DIPSTICK (AUTOMATED)
Bilirubin, UA: NEGATIVE
Blood, UA: NEGATIVE
Glucose, UA: NEGATIVE
Ketones, UA: NEGATIVE
Nitrite, UA: POSITIVE
Protein, UA: POSITIVE — AB
Spec Grav, UA: 1.015 (ref 1.010–1.025)
Urobilinogen, UA: 0.2 E.U./dL
pH, UA: 6 (ref 5.0–8.0)

## 2023-02-20 MED ORDER — CEPHALEXIN 500 MG PO CAPS: 500.0000 mg | ORAL_CAPSULE | Freq: Three times a day (TID) | ORAL | 0 refills | Status: DC

## 2023-02-20 NOTE — Progress Notes (Signed)
Subjective:    Patient ID: [name], female    DOB: [birth year], 77 y.o.   MRN: [id number]  HPI Here due to urinary symptoms  Had bladder infection last month Got cephalexin and her symptoms were better Took the meds for 10 days  Started dysuria again last week Then noted pressure Some increased frequency and urgency Some incontinence No blood No fever  Had frequent UTIs in the past--and even sepsis once Does take cranberry  Current Outpatient Medications on File Prior to Visit  Medication Sig Dispense Refill   acetaminophen (TYLENOL) 650 MG CR tablet Take 2 tablets (1,300 mg total) by mouth every 8 (eight) hours as needed for pain.     AMBULATORY NON FORMULARY MEDICATION Lift chair Dx:  G20 1 Device 0   Calcium Carbonate-Vitamin D (CALCIUM 600/VITAMIN D) 600-400 MG-UNIT chew tablet Chew 1 tablet by mouth daily.     carbidopa-levodopa (SINEMET IR) 25-100 MG tablet 1 po tid prn (Patient taking differently: Take 2 tablets by mouth in the morning and at bedtime.) 270 tablet 1   Cholecalciferol (VITAMIN D3) 25 MCG (1000 UT) CAPS Take 1 capsule (1,000 Units total) by mouth daily. 30 capsule    clonazePAM (KLONOPIN) 0.5 MG tablet TAKE 0.5 TABLETS (0.25 MG TOTAL) BY MOUTH EVERY MORNING AND 1 TABLET (0.5 MG TOTAL) AT BEDTIME. 45 tablet 0   clotrimazole-betamethasone (LOTRISONE) cream Apply 1 application topically daily. 30 g 0   cyanocobalamin (VITAMIN B12) 1000 MCG/ML injection INJECT 1 ML (1,000 MCG TOTAL) INTO THE MUSCLE EVERY 30 DAYS. 3 mL 2   denosumab (PROLIA) 60 MG/ML SOSY injection Inject 60 mg into the skin every 6 (six) months.     fludrocortisone (FLORINEF) 0.1 MG tablet TAKE 1 TABLET BY MOUTH EVERY DAY 30 tablet 6   Glycerin-Hypromellose-PEG 400 (ARTIFICIAL TEARS) 0.2-0.2-1 % SOLN Place 1 drop into both eyes daily as needed (dry eyes).     latanoprost (XALATAN) 0.005 % ophthalmic solution Place 1 drop into both eyes at bedtime.     mirabegron ER (MYRBETRIQ) 25 MG TB24  tablet Take 1 tablet (25 mg total) by mouth daily. 30 tablet 11   pregabalin (LYRICA) 50 MG capsule Take 1 capsule (50 mg total) by mouth 3 (three) times daily. 90 capsule 1   sertraline (ZOLOFT) 100 MG tablet Take 1 tablet (100 mg total) by mouth daily. 100 tablet 3   SYRINGE-NEEDLE, DISP, 3 ML (BD SAFETYGLIDE SYRINGE/NEEDLE) 25G X 1" 3 ML MISC Use to inject vitamin B12 monthly. 50 each 0   traMADol (ULTRAM) 50 MG tablet TAKE 1 TABLET (50 MG TOTAL) BY MOUTH EVERY 6 (SIX) HOURS AS NEEDED FOR MODERATE PAIN 30 tablet 3   No current facility-administered medications on file prior to visit.    Allergies  Allergen Reactions   Iohexol Hives and Shortness Of Breath     Code: HIVES, Desc: PT developed 2 hives, followed by SOB, severe headache post 87cc's Omnipaque 300., Onset Date: [id number]    Amitriptyline Other (See Comments)    Sedated next morning   Ciprofloxacin Nausea And Vomiting   Cymbalta [Duloxetine Hcl] Other (See Comments)    Worsened depression   Lyrica [Pregabalin] Other (See Comments)    Tried during hospitalization - unsure effects but unable to tolerate   Imipramine Hcl Rash   Iodine Rash   Lidocaine Hcl Rash   Morphine Sulfate Rash   Neosporin [Neomycin-Bacitracin Zn-Polymyx] Rash and Other (See Comments)    Worsened skin breaking out  Sulfamethoxazole Rash   Tetracyclines & Related Rash    Past Medical History:  Diagnosis Date   Allergy    ANEMIA-NOS 09/25/2007   Anxiety    Arthritis    B12 DEFICIENCY 05/03/2007   Blood transfusion without reported diagnosis    CAD (coronary artery disease) 01/2010   MI, Nishan   CAP (community acquired pneumonia) 03/29/2022   Cardiomyopathy ([place]) 02/08/2010   H/o this 2012 after urosepsis, no recurrence.    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   OSTEOPOROSIS 08/2009   bisphosphonate on hold 2/2 dysphagia, on reclast done in August each year   Parkinson's disease 08/25/2015   Dx Dr [name] 07/2015    Pneumonia    PONV (postoperative nausea and vomiting)    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   Takotsubo cardiomyopathy 2008   due to E coli urosepsis    Past Surgical History:  Procedure Laterality Date   ABDOMINAL HYSTERECTOMY  1970s   IUD infection - first partial then with oophorectomy (cysts), complication - low blood pressure   CATARACT EXTRACTION Bilateral    CHOLECYSTECTOMY     complication - low blood pressure   COLONOSCOPY  06/2008   h/o polyps but latest WNL, rec rpt 10 yrs Olevia Perches)   COLONOSCOPY  11/2018   multiple TAs (10 polyps total), rpt 2 yrs Fuller Plan)   COLONOSCOPY  04/2021   multiple TAs, rpt 3 yrs Fuller Plan)   CYSTOSCOPY  12/2013   abx treatment for recurrent cystitis   DEXA  04/2013   T -2.9 @ femur, -1.6 @ spine   DEXA  04/2017   T -2.9 hip, -0.7 spine   ESOPHAGOGASTRODUODENOSCOPY  12/2017   WNL, regardless esophagus dilated, small HH Fuller Plan)   EYE SURGERY     LUMBAR LAMINECTOMY/DECOMPRESSION MICRODISCECTOMY Right 11/27/2019   Right Lumbar Four-Five foraminotomy;  Erline Levine, MD)   LUMBAR LAMINECTOMY/DECOMPRESSION MICRODISCECTOMY Right 05/16/2022   Procedure: OPEN LUMBAR LAMINECTOMY, RT, L45 W/LATERAL RECESS DECOMPRESSION;  Surgeon: Karsten Ro, DO;  Location: [facility];  Service: Neurosurgery;  Laterality: Right;  3C   MINOR PLACEMENT OF FIDUCIAL N/A 06/30/2021   Procedure: MINOR PLACEMENT OF FIDUCIAL;  Surgeon: Erline Levine, MD;   Location: [place];  Service: Neurosurgery;  Laterality: N/A;  Minor room   PTOSIS REPAIR Bilateral 10/2020   Plastic Surgery   PULSE GENERATOR IMPLANT Right 07/14/2021   Procedure: UNILATERAL PULSE GENERATOR IMPLANT;  Surgeon: Erline Levine, MD;  Location: [place];  Service: Neurosurgery;  Laterality: Right;   SUBTHALAMIC STIMULATOR INSERTION Bilateral 07/07/2021   Procedure: Deep brain stimulator placement;  Surgeon: Erline Levine, MD;  Location: [place];  Service: Neurosurgery;  Laterality: Bilateral;   TUBAL LIGATION     UPPER GASTROINTESTINAL ENDOSCOPY      Family History  Problem Relation Age of Onset   Heart attack Father    Diabetes Father  Prostate cancer Father    Esophageal cancer Mother    Lung cancer Brother    Breast cancer Sister    Ovarian cancer Sister    Lung cancer Brother    CAD Brother    Uterine cancer Sister    Clotting disorder Son    Healthy Son    Healthy Daughter    Healthy Daughter    Colon cancer Neg Hx    Rectal cancer Neg Hx    Stomach cancer Neg Hx     Social History   Socioeconomic History   Marital status: Widowed    Spouse name: Not on file   Number of children: 4   Years of education: Not on file   Highest education level: 10th grade  Occupational History   Occupation: retired  Tobacco Use   Smoking status: Never   Smokeless tobacco: Never  Vaping Use   Vaping Use: Never used  Substance and Sexual Activity   Alcohol use: No   Drug use: Not Currently   Sexual activity: Never  Other Topics Concern   Not on file  Social History Narrative   Widow - husband [name]) passed away March 07, 2013.     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   Activity: limited by back pain   Right [place] Strain: Patient Declined (02/19/2023)   Overall Financial Resource Strain  (CARDIA)    Difficulty of Paying Living Expenses: Patient declined  Food Insecurity: Patient Declined (02/19/2023)   Hunger Vital Sign    Worried About Running Out of Food in the Last Year: Patient declined    [place] in the Last Year: Patient declined  Transportation Needs: Patient Declined (02/19/2023)   PRAPARE - Hydrologist (Medical): Patient declined    Lack of Transportation (Non-Medical): Patient declined  Physical Activity: Unknown (02/19/2023)   Exercise Vital Sign    Days of Exercise per Week: Patient declined    Minutes of Exercise per Session: Not on file  Stress: Patient Declined (02/19/2023)   [place]    Feeling of Stress : Patient declined  Social Connections: Moderately Integrated (02/19/2023)   Social Connection and Isolation Panel [NHANES]    Frequency of Communication with Friends and Family: More than three times a week    Frequency of Social Gatherings with Friends and Family: Once a week    Attends Religious Services: More than 4 times per year    Active Member of [employer] or Organizations: Yes    Attends Archivist Meetings: More than 4 times per year    Marital Status: Widowed  Intimate Partner Violence: Not At Risk (10/28/2021)   Humiliation, Afraid, Rape, and Kick questionnaire    Fear of Current or Ex-Partner: No    Emotionally Abused: No    Physically Abused: No    Sexually Abused: No   Review of Systems Some ongoing rib pain on right since last month No N/V No abdominal pain    Objective:   Physical Exam Constitutional:      Appearance: Normal appearance.  Abdominal:     Palpations: Abdomen is soft.     Comments: Moderate suprapubic tenderness Sensitive in CVA areas bilaterally  Neurological:     Mental Status: She  is alert.            Assessment & Plan:

## 2023-02-20 NOTE — Addendum Note (Signed)
Addended by: Pilar Grammes on: 02/20/2023 09:59 AM   Modules accepted: Orders

## 2023-02-20 NOTE — Assessment & Plan Note (Signed)
Symptoms of cystitis again---no real systemic symptoms Urinalysis shows 3+ leuks and positive nitrite Pansensitive E coli last month--will recheck to be sure no resistance Cephalexin 500 tid x 3 days (if symptoms gone after first couple of doses--otherwise for a week) Discussed consideration for vaginal estrogen if recurrent spells--will send to Dr Darnell Level

## 2023-02-20 NOTE — Patient Instructions (Signed)
You can stop the cephalexin after 3 days if your symptoms are gone with just 2-3 doses today.

## 2023-02-23 LAB — URINE CULTURE
MICRO NUMBER:: 14743071
SPECIMEN QUALITY:: ADEQUATE

## 2023-03-15 NOTE — Progress Notes (Signed)
Assessment/Plan:    1.  Parkinsons Disease             -Patient completed neurocognitive testing on January 25, 2021 with Dr. Roseanne Reno.  Evidence of MCI only.  -Patient is status post bilateral STN DBS on July 07, 2021 with IPG placement on July 14, 2021.    -Continue carbidopa/levodopa 25/100, 1 tab twice per day but if has too much dyskinesia with 2nd dose, she is to take 1 in the AM, 0.5 in the middle of the day, 0.5 mg evening  -some discomfort when hits scalp wire at night.  She did not tolerate gabapentin or Lyrica.  She is having trouble sleeping because of this.  She states that clonazepam use to help her sleep and she is still on it but it is not helping enough.  We decided to try very low-dose Pamelor.  She was on amitriptyline in the past and while she reports that as an allergy, the allergy was just that she had some morning sedation/hangover effect.  We discussed the risks/benefits/side effects of amitriptyline, and particularly its interaction with her other medications and potential for long QT/serotonin syndrome.  We decided to start with a very low-dose of 10 mg at bedtime.  -She needs to get back to physical therapy.  She cannot get this week because her son is out of town and states that next week will be difficult because her sister is having heart surgery.  I did send the referral, but she does know that she will need to wait a few weeks. 2.  Dyskinesia             -mostly resolved 3.  Neurogenic Orthostatic Hypotension             -Did well on Northera.  Patient stopped that on her own.  Now on Florinef 0.1 mg daily 4.  Depression             -On sertraline.  Doing well. 5.  b12 deficiency             -on injections. 6.  Urinary incontinence             -on myrbetriq.  She is doing well in that regard 7.  Lumbar radiculopathy             -She is status post lumbar foraminotomy with Dr. Venetia Maxon on November 27, 2019.  Pain has reemerged and she underwent L4-L5 laminectomy  with Dr. Jake Samples on May 16, 2022. 8.  Depression  -On sertraline, 100 mg daily. 9.  Dysphagia  -MBE done in June, 2023 with mild oropharyngeal dysphagia.  Regular diet with thin liquids recommended.   Subjective:   [name] was seen today in follow up for levodopa challenge.  My previous records were reviewed prior to todays visit as well as outside records available to me.  Patient called since our last visit and was having headaches where the wire was on the scalp when she was laying down at night.  We ended up starting her on some low-dose gabapentin.  She called back a few weeks later stating that she was not able to tolerate it because of dizziness.  We changed her to Lyrica.  There was some question on whether she was allergic to it as it was on her allergy list.  She reports she did not have an allergy, but ended up stopping it.  She cannot recall why she is still having some  pain at night and headaches.  She reports one fall.  It was in the bathroom.  She got up too fast and spun around and fell and apparently had rib fx.  This was in [place].  No other falls.  She didn't pass out.  Current prescribed movement disorder medications: Carbidopa/levodopa 25/100, 1 tablet twice per day (6am/3pm) Lyrica 50 mg nightly  PREVIOUS MEDICATIONS: northera (worked well but pt d/c when they quit sending b/c she owed $); amantadine (discontinued because of hallucinations); pramipexole (decreased in past because of hallucinations/dyskinesia); clonazepam (stopped it because of fatigue, but also because she just did not need it any longer)  ALLERGIES:   Allergies  Allergen Reactions   Iohexol Hives and Shortness Of Breath     Code: HIVES, Desc: PT developed 2 hives, followed by SOB, severe headache post 87cc's Omnipaque 300., Onset Date: [id number]    Amitriptyline Other (See Comments)    Sedated next morning   Ciprofloxacin Nausea And Vomiting   Cymbalta [Duloxetine Hcl] Other (See Comments)     Worsened depression   Lyrica [Pregabalin] Other (See Comments)    Tried during hospitalization - unsure effects but unable to tolerate   Imipramine Hcl Rash   Iodine Rash   Lidocaine Hcl Rash   Morphine Sulfate Rash   Neosporin [Neomycin-Bacitracin Zn-Polymyx] Rash and Other (See Comments)    Worsened skin breaking out   Sulfamethoxazole Rash   Tetracyclines & Related Rash    CURRENT MEDICATIONS:  Outpatient Encounter Medications as of 03/16/2023  Medication Sig   acetaminophen (TYLENOL) 650 MG CR tablet Take 2 tablets (1,300 mg total) by mouth every 8 (eight) hours as needed for pain.   AMBULATORY NON FORMULARY MEDICATION Lift chair Dx:  G20   Calcium Carbonate-Vitamin D (CALCIUM 600/VITAMIN D) 600-400 MG-UNIT chew tablet Chew 1 tablet by mouth daily.   carbidopa-levodopa (SINEMET IR) 25-100 MG tablet 1 po tid prn (Patient taking differently: Take 2 tablets by mouth in the morning and at bedtime.)   cephALEXin (KEFLEX) 500 MG capsule Take 1 capsule (500 mg total) by mouth 3 (three) times daily.   Cholecalciferol (VITAMIN D3) 25 MCG (1000 UT) CAPS Take 1 capsule (1,000 Units total) by mouth daily.   clonazePAM (KLONOPIN) 0.5 MG tablet TAKE 0.5 TABLETS (0.25 MG TOTAL) BY MOUTH EVERY MORNING AND 1 TABLET (0.5 MG TOTAL) AT BEDTIME.   clotrimazole-betamethasone (LOTRISONE) cream Apply 1 application topically daily.   cyanocobalamin (VITAMIN B12) 1000 MCG/ML injection INJECT 1 ML (1,000 MCG TOTAL) INTO THE MUSCLE EVERY 30 DAYS.   denosumab (PROLIA) 60 MG/ML SOSY injection Inject 60 mg into the skin every 6 (six) months.   fludrocortisone (FLORINEF) 0.1 MG tablet TAKE 1 TABLET BY MOUTH EVERY DAY   Glycerin-Hypromellose-PEG 400 (ARTIFICIAL TEARS) 0.2-0.2-1 % SOLN Place 1 drop into both eyes daily as needed (dry eyes).   latanoprost (XALATAN) 0.005 % ophthalmic solution Place 1 drop into both eyes at bedtime.   mirabegron ER (MYRBETRIQ) 25 MG TB24 tablet Take 1 tablet (25 mg total) by mouth  daily.   pregabalin (LYRICA) 50 MG capsule Take 1 capsule (50 mg total) by mouth 3 (three) times daily.   sertraline (ZOLOFT) 100 MG tablet Take 1 tablet (100 mg total) by mouth daily.   SYRINGE-NEEDLE, DISP, 3 ML (BD SAFETYGLIDE SYRINGE/NEEDLE) 25G X 1" 3 ML MISC Use to inject vitamin B12 monthly.   traMADol (ULTRAM) 50 MG tablet TAKE 1 TABLET (50 MG TOTAL) BY MOUTH EVERY 6 (SIX) HOURS AS NEEDED FOR  MODERATE PAIN   No facility-administered encounter medications on file as of 03/16/2023.    Objective:   PHYSICAL EXAMINATION:    VITALS:   Vitals:   03/16/23 1441  BP: 120/80  Pulse: 67  SpO2: 99%  Weight: 156 lb (70.8 kg)  Height: 5\' 6"  (1.676 m)       GEN:  The patient appears stated age and is in NAD. HEENT:  Normocephalic, atraumatic.  The mucous membranes are moist. The superficial temporal arteries are without ropiness or tenderness. CV:  RRR Lungs:  CTAB Neck/HEME:  There are no carotid bruits bilaterally. Skin: Battery site looks good  Neurological examination:  Orientation: The patient is alert and oriented x3. Cranial nerves: There is chronic L facial droop.  Smile is symmetric. The speech is fluent and hypophonic and occasionally dysarthric.  This is similar to last visit.  Soft palate rises symmetrically and there is no tongue deviation. Hearing is intact to conversational tone. Sensation: Sensation is intact to light touch throughout Motor: Strength is at least antigravity x4.  Movement examination: Tone: There is normal tone in the upper and lower extremities before and after programming Abnormal movements: no tremor today.   Coordination:  There is mild decremation on the R and with toe taps bilaterally Gait and Station: She does have start hesitation.  She is ambulating well in the hall.      Chemistry      Component Value Date/Time   NA 140 01/03/2023 0846   K 3.5 01/03/2023 0846   CL 103 01/03/2023 0846   CO2 29 01/03/2023 0846   BUN 15 01/03/2023  0846   CREATININE 0.68 01/03/2023 0846   CREATININE 0.87 10/08/2019 0921      Component Value Date/Time   CALCIUM 9.1 01/03/2023 0846   ALKPHOS 62 01/03/2023 0846   AST 13 01/03/2023 0846   ALT 9 01/03/2023 0846   BILITOT 0.4 01/03/2023 0846       Lab Results  Component Value Date   WBC 4.9 01/03/2023   HGB 12.2 01/03/2023   HCT 37.8 01/03/2023   MCV 87.0 01/03/2023   PLT 262.0 01/03/2023    Lab Results  Component Value Date   TSH 0.49 10/04/2018    Total time spent on today's visit was 40 minutes, including both face-to-face time and nonface-to-face time.  Time included that spent on review of records (prior notes available to me/labs/imaging if pertinent), discussing treatment and goals, answering patient's questions and coordinating care.  This did not include DBS time.     Cc:  Eustaquio Boyden, MD

## 2023-03-16 ENCOUNTER — Encounter: Payer: Self-pay | Admitting: Neurology

## 2023-03-16 ENCOUNTER — Ambulatory Visit: Payer: Medicare Other | Admitting: Neurology

## 2023-03-16 VITALS — BP 120/80 | HR 67 | Ht 66.0 in | Wt 156.0 lb

## 2023-03-16 DIAGNOSIS — R519 Headache, unspecified: Secondary | ICD-10-CM

## 2023-03-16 DIAGNOSIS — G20B2 Parkinson's disease with dyskinesia, with fluctuations: Secondary | ICD-10-CM | POA: Diagnosis not present

## 2023-03-16 DIAGNOSIS — Z9689 Presence of other specified functional implants: Secondary | ICD-10-CM

## 2023-03-16 DIAGNOSIS — G4700 Insomnia, unspecified: Secondary | ICD-10-CM

## 2023-03-16 MED ORDER — NORTRIPTYLINE HCL 10 MG PO CAPS: 10.0000 mg | ORAL_CAPSULE | Freq: Every day | ORAL | 3 refills | Status: DC

## 2023-03-16 NOTE — Procedures (Signed)
DBS Programming was performed.    Manufacturer of DBS device: [employer], bluetooth  Total time spent programming was 8 minutes.  Device was confirmed to be on.  Soft start was confirmed to be on but was increased to 10.  Impedences were checked and were within normal limits.  Battery was checked and was determined to be functioning normally and not near the end of life.  Final settings were as follows:    Active Contact Amplitude (mA) PW (ms) Frequency (hz) Side Effects  Left Brain       08/08/21 3-C+ 1.7 60 130   Other trials        1-C+ 2.7 60 130 tremor   (2/3/4)-C+ 3.0 60 130 Face pull above 2.1   (5/6/7)-C+ 1.8 60 130 ? Face pull (some baseline facial asymmetry)  08/22/21 5-(25%)7-(75%)C+ 2.5 60 159   09/05/21 final 5-(25%)6-(75%)C+ 2.9 60 159   Other trials 5-(50%)6-(50%)C+ 2.9 60 159    5-(25%)7-(75%)C+ 3.5 60 159 Tremor not controlled  10/31/21 5-(25%)7-(75%)C+ 3.3 60 159   05/01/22 5-(25%)7-(75%)C+ 3.6 60 159 Transient lip tingle  08/11/22 5-(25%)7-(75%)C+ 3.7 60 159   09/25/22 6-(25%)7-(75%)C+ 3.7 60 159   03/16/23 3c(25%)3b(75%)C+ 3.7 60 159                 Right Brain       08/08/21 (5/6/7)-C+ 1.6 60 130   Other trials 1-C+ 1.5 60 130 ? Speech change   (2/3/4)-C+ 1.5 60 130 Pretty good   8-C+ 1.6 60 130 tremor  08/22/21 8-C+ 2.7 60 136   09/05/21 final 6-(50%)8-(50%)C+ 2.9 60 159   others 6-(60%)8-(40%)C+ 2.8  60 136 Mouth dyskinesia  10/31/21 6-(50%)8-(50%)C+ 3.4 60 159   05/01/22 6-(50%)8-(50%)C+ 3.6 60 159   08/11/22 6-(50%)8-(50%)C+ 3.5 60 159   09/25/22 6-(50%)8-(50%)C+ 3.5 60 159   03/16/23 3b(50%)4(50%)C+ 3.5 60 159

## 2023-03-16 NOTE — Patient Instructions (Addendum)
Start nortriptyline 10 mg at bedtime  Local and Online Resources for Power over Parkinson's Group  April 2024   LOCAL [place] PARKINSON'S GROUPS   Power over Parkinson's Group:    Power Over Parkinson's Patient Education Group will be Wednesday, April 10th-*Hybrid meting*- in person at [facility] location and via [facility], 2:00-3:00 pm.   Power over [employer] and Care Partner Groups will meet together, with plans for separate break out session for caregivers, depending on topic/speaker Upcoming Power over Parkinson's Meetings/Care Partner Support:  2nd Wednesdays of the month at 2 pm:   April 10th, May 8th Contact Amy Marriott at [email or web address] if interested in participating in this group    LOCAL EVENTS AND NEW OFFERINGS  NEW:  Parkinson's Social Game Night.  First Thursday of each month, 2:00-4:00 pm.  *Next date is April 4th*.  Rossie Muskrat [employer], [employer].  Contact [email or web address] if interested. Parkinson's CarePartner Group for Men is in the works, if interested email [email or web address] ACT FITNESS Chair Yoga classes "Train and Gain", Fridays 10 am, [employer].  Contact Gina at [phone].  Health visitor Classes offering at [employer]!  TUESDAYS (Chair Yoga)  and Wednesdays (PWR! Moves)  1:00 pm.   Contact Synetta Shadow at  [email or web address]  or [phone]  Drumming for Parkinson's will be held on 2nd and 4th Mondays at 11:00 am.   Located at the [place] of the [employer] ([address]. [place].)  Contact Albertina Parr at [email or web address] or [phone]  Dance for Parkinson 's classes will be on Tuesdays 10-11 am. Located in the [employer], in the first floor of the [employer] ([address] To register:  [email or web address] or [phone] [facility] Parkinson's Tai Chi Class, Mondays at  11 am.  Call [phone] for details Hamil-Kerr Challenge.  Bike, Run, [employer] for [employer].  Saturday, April 6th at [facility].  To register, visit [email or web address] Moving Day [facility].  Saturday, May 4th, 10 am start.  Register at [email or web address]    ONLINE EDUCATION AND SUPPORT  Parkinson Foundation:  www.parkinson.org  PD Health at Home continues:  Mindfulness Mondays, Wellness Wednesdays, Fitness Fridays  (PWR! Moves as part of Fitness Fridays March 22nd, 1-1:45 pm) Upcoming Education:   Parkinson's 101.  Wednesday, April 3rd, 1-2 pm Movement for Parkinson's.  Wednesday, May 1st, 1-2 pm Expert Briefing:  Research Update:  Working to [employer] PD.  Wednesday, April 10th, 1-2 pm Trouble with Zzz's:  Sleep Challenges with Parkinson's.  Wed, May 8th 1-2 pm Register for virtual education and expert briefings (webinars) at [email or web address].gl Please check out their website to sign up for emails and see their full online offerings     [employer] Foundation:  [email or web address].[email or web address]   Third Thursday Webinars:  On the third Thursday of every month at 12 p.m. ET, join our free live webinars to learn about various aspects of living with Parkinson's disease and our work to speed medical breakthroughs.  Upcoming Webinar:  Let's Talk Taboos:  Hard-to-Discuss Parkinson's Symptoms.  Thursday, April 18th at 12 noon. Check out additional information on their website to see their full online offerings    [facility]:  [email or web address]  Upcoming Webinar:   Emergent Therapies.  Wednesday, April 2nd, 4 pm Series:  Living with Parkinson's Meetup.   Third Thursdays each month, 3 pm  Care Partner Monthly Meetup.  With Jillene Bucks [place].  First Tuesday of each  month, 2 pm  Check out additional information to Live Well Today on their website    Parkinson and Movement Disorders (PMD) Alliance:   www.pmdalliance.org  [employer] Online:  Online Education Events  Sign up for emails, which are sent weekly to give you updates on programming and online offerings    Parkinson's Association of the [place]:  www.parkinsonassociation.org  Information on online support groups, education events, and online exercises including Yoga, Parkinson's exercises and more-LOTS of information on links to PD resources and online events  Virtual Support Group through Parkinson's Association of the [place]; next one is scheduled for Wednesday, April 3rd  MOVEMENT AND EXERCISE OPPORTUNITIES  PWR! Moves Classes at [facility] Exercise Room.  Wednesdays 10 and 11 am.   Contact Amy Marriott, PT [email or web address] if interested.  Parkinson's Exercise Class offerings at [employer]. *TUESDAYS* (Chair yoga) and Wednesdays (PWR! Moves)  1:00 pm.    Contact Synetta Shadow at [email or web address]    Parkinson's Wellness Recovery (PWR! Moves)  www.[email or web address]  Info on the PWR! Virtual Experience:  You will have access to our expertise?through self-assessment, guided plans that start with the PD-specific fundamentals, educational content, tips, Q&A with an expert, and a growing Engineering geologist of PD-specific pre-recorded and live exercise classes of varying types and intensity - both physical and cognitive! If that is not enough, we offer 1:1 wellness consultations (in-person or virtual) to personalize your PWR! Dance movement psychotherapist.   Parkinson [employer] Fridays:   As part of the PD Health @ Home program, this free video series focuses each week on one aspect of fitness designed to support people living with Parkinson's.? These weekly videos highlight the Parkinson Foundation fitness guidelines for people with Parkinson's disease.  MenusLocal.com.[email or web address]  Dance for PD website is offering free, live-stream classes throughout the week, as well as links to  [employer] of classes:  [email or web address]  Virtual dance and Pilates for Parkinson's classes: Click on the Community Tab> Parkinson's Movement Initiative Tab.  To register for classes and for more information, visit www.[email or web address].co.[email or web address] and click the "community" tab.   [employer] Parkinson's Cycling Classes   [employer]:  Thursdays @ Noon-Live classes at [employer] ([employer] at [email or web address]?or [phone])  [employer]: Classes Tuesday, Wednesday and Thursday (contact [place] at [email or web address] ?or [phone])  [facility] [employer]  Varied levels of classes are offered Tuesdays and Thursdays at [employer].   Stretching with Byrd Hesselbach weekly class is also offered for people with Parkinson's  To observe a class or for more information, call [phone] or email Patricia Nettle at [facility] .com   ADDITIONAL SUPPORT AND RESOURCES  Well[employer]Spring [employer]:  Online Caregiver Education Opportunities:  www.well-springsolutions.org/caregiver-education/caregiver-support-group.  You may also contact Loleta Chance at [facility] -spring.org or [phone].     Well[employer]Spring [employer] April [employer] Decisions Day:  The Most Critical Legal and Medical Decisions to Consider Now!  Tuesday, April 16th, 1-3 pm at [facility] at [facility].  Contact Loleta Chance at [facility] -spring.org or [phone] Powerful Tools for Caregivers.  6 week educational series for caregivers.  April 18-May 23, 10:30 am-12:15 pm at [facility] 3rd Floor Conference Room.   Contact Loleta Chance at [place] [employer] -spring.org or [phone] to register Well-Spring Navigator:  Just1Navigator program, a?free service to help individuals and families through the journey of determining care for older adults.  The "Navigator" is a Child psychotherapist, Sidney Ace, who will speak with a  prospective client and/or loved ones to provide an assessment of the situation and a  set of recommendations for a personalized care plan -- all free of charge, and whether?Well[employer]Spring [employer] offers the needed service or not. If the need is not a service we provide, we are well-connected with reputable programs in town that we can refer you to.  www.well-springsolutions.org or to speak with the Navigator, call [phone].

## 2023-03-19 ENCOUNTER — Telehealth: Payer: Self-pay

## 2023-03-19 DIAGNOSIS — M810 Age-related osteoporosis without current pathological fracture: Secondary | ICD-10-CM

## 2023-03-19 NOTE — Telephone Encounter (Signed)
Please start new PA for Prolia for [name].  Her last inj was on 09/21/2022.  Please respond to Barnie Mort at [facility].  I am just filling in for the week.  Thank you!

## 2023-03-19 NOTE — Telephone Encounter (Signed)
Prolia VOB initiated via MyAmgenPortal.com 

## 2023-03-19 NOTE — Telephone Encounter (Signed)
New encounter created for Prolia BIV 

## 2023-03-22 ENCOUNTER — Encounter: Payer: Self-pay | Admitting: Neurology

## 2023-03-23 ENCOUNTER — Other Ambulatory Visit (HOSPITAL_COMMUNITY): Payer: Self-pay

## 2023-03-23 NOTE — Telephone Encounter (Signed)
Pt ready for scheduling for Prolia on or after : 03/23/23  Out-of-pocket cost due at time of visit: $332  Primary: UHC Medicare Prolia co-insurance: 20% Admin fee co-insurance: $30  Secondary: --- Prolia co-insurance:  Admin fee co-insurance:   Medical Benefit Details: Date Benefits were checked: 03/19/23 Deductible: NO/ Coinsurance: 20%/ Admin Fee: $30  Prior Auth: APPROVED PA# [id number]  Expiration Date: 03/22/2024   Pharmacy benefit: Copay $300 If patient wants fill through the pharmacy benefit please send prescription to: [facility], and include estimated need by date in rx notes. Pharmacy will ship medication directly to the office.  Patient NOT eligible for Prolia Copay Card. Copay Card can make patient's cost as little as $25. Link to apply: https://www.[email or web address]/copay  ** This summary of benefits is an estimation of the patient's out-of-pocket cost. Exact cost may very based on individual plan coverage.

## 2023-03-23 NOTE — Telephone Encounter (Signed)
   Authorization Number: [id number]   03/23/23-03/22/24

## 2023-03-27 ENCOUNTER — Encounter: Payer: Medicare Other | Admitting: Neurology

## 2023-03-27 NOTE — Telephone Encounter (Signed)
Left message to return call to our office.  

## 2023-04-01 ENCOUNTER — Encounter: Payer: Self-pay | Admitting: Family Medicine

## 2023-04-02 ENCOUNTER — Ambulatory Visit: Payer: Medicare Other | Attending: Neurology | Admitting: Physical Therapy

## 2023-04-02 ENCOUNTER — Encounter: Payer: Self-pay | Admitting: Physical Therapy

## 2023-04-02 DIAGNOSIS — R262 Difficulty in walking, not elsewhere classified: Secondary | ICD-10-CM

## 2023-04-02 DIAGNOSIS — G20B1 Parkinson's disease with dyskinesia, without mention of fluctuations: Secondary | ICD-10-CM | POA: Diagnosis not present

## 2023-04-02 DIAGNOSIS — R2681 Unsteadiness on feet: Secondary | ICD-10-CM | POA: Diagnosis not present

## 2023-04-02 DIAGNOSIS — G20B2 Parkinson's disease with dyskinesia, with fluctuations: Secondary | ICD-10-CM | POA: Insufficient documentation

## 2023-04-02 DIAGNOSIS — M5459 Other low back pain: Secondary | ICD-10-CM | POA: Insufficient documentation

## 2023-04-02 DIAGNOSIS — M6281 Muscle weakness (generalized): Secondary | ICD-10-CM

## 2023-04-02 MED ORDER — CLONAZEPAM 0.5 MG PO TABS: ORAL_TABLET | ORAL | 0 refills | Status: DC

## 2023-04-02 NOTE — Therapy (Signed)
OUTPATIENT PHYSICAL THERAPY NEURO EVALUATION   Patient Name: [name] MRN: [id number] DOB:07-28-1946, 77 y.o., female Today's Date: 04/02/2023   PCP: Lydia Guiles PROVIDER: [name]   PT End of Session - 04/02/23 1528     Visit Number 1    Date for PT Re-Evaluation 07/03/23    Authorization Type UHC Medicare    PT Start Time 1528    PT Stop Time 1615    PT Time Calculation (min) 47 min    Activity Tolerance Patient tolerated treatment well    Behavior During Therapy [facility] for tasks assessed/performed             Past Medical History:  Diagnosis Date   Allergy    ANEMIA-NOS 09/25/2007   Anxiety    Arthritis    B12 DEFICIENCY 05/03/2007   Blood transfusion without reported diagnosis    CAD (coronary artery disease) 01/2010   MI, Nishan   CAP (community acquired pneumonia) 03/29/2022   Cardiomyopathy (HCC) 02/08/2010   H/o this 2012 after urosepsis, no recurrence.    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/08/2010   OSTEOPOROSIS 08/2009   bisphosphonate on hold 2/2 dysphagia, on reclast done in August each year   Parkinson's disease 08/25/2015   Dx Dr [name] 07/2015    Pneumonia    PONV (postoperative nausea and vomiting)    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   Takotsubo cardiomyopathy 2008   due to E coli urosepsis   Past Surgical History:  Procedure Laterality Date   ABDOMINAL HYSTERECTOMY  1970s   IUD infection - first  partial then with oophorectomy (cysts), complication - low blood pressure   CATARACT EXTRACTION Bilateral    CHOLECYSTECTOMY     complication - low blood pressure   COLONOSCOPY  06/2008   h/o polyps but latest WNL, rec rpt 10 yrs Juanda Chance)   COLONOSCOPY  11/2018   multiple TAs (10 polyps total), rpt 2 yrs Russella Dar)   COLONOSCOPY  04/2021   multiple TAs, rpt 3 yrs Russella Dar)   CYSTOSCOPY  12/2013   abx treatment for recurrent cystitis   DEXA  04/2013   T -2.9 @ femur, -1.6 @ spine   DEXA  04/2017   T -2.9 hip, -0.7 spine   ESOPHAGOGASTRODUODENOSCOPY  12/2017   WNL, regardless esophagus dilated, small HH Russella Dar)   EYE SURGERY     LUMBAR LAMINECTOMY/DECOMPRESSION MICRODISCECTOMY Right 11/27/2019   Right Lumbar Four-Five foraminotomy;  Maeola Harman, MD)   LUMBAR LAMINECTOMY/DECOMPRESSION MICRODISCECTOMY Right 05/16/2022   Procedure: OPEN LUMBAR LAMINECTOMY, RT, L45 W/LATERAL RECESS DECOMPRESSION;  Surgeon: Bethann Goo, DO;  Location: [facility] OR;  Service: Neurosurgery;  Laterality: Right;  3C   MINOR PLACEMENT OF FIDUCIAL N/A 06/30/2021   Procedure: MINOR PLACEMENT OF FIDUCIAL;  Surgeon: Maeola Harman, MD;  Location: [facility] OR;  Service: Neurosurgery;  Laterality: N/A;  Minor room  PTOSIS REPAIR Bilateral 10/2020   Plastic Surgery   PULSE GENERATOR IMPLANT Right 07/14/2021   Procedure: UNILATERAL PULSE GENERATOR IMPLANT;  Surgeon: Maeola Harman, MD;  Location: [facility] OR;  Service: Neurosurgery;  Laterality: Right;   SUBTHALAMIC STIMULATOR INSERTION Bilateral 07/07/2021   Procedure: Deep brain stimulator placement;  Surgeon: Maeola Harman, MD;  Location[facility];  Service: Neurosurgery;  Laterality: Bilateral;   TUBAL LIGATION     UPPER GASTROINTESTINAL ENDOSCOPY     Patient Active Problem List   Diagnosis Date Noted   Urinary incontinence 01/12/2023   Lumbar radiculopathy, chronic 05/16/2022   Pre-op evaluation 04/26/2022   Localized swelling of chest wall 12/23/2021   Other fatigue 11/25/2021    Lymphadenopathy 11/25/2021   Closed fracture of rib of right side with routine healing 05/28/2021   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   Left shoulder pain 06/19/2020   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   Degenerative lumbar spinal stenosis 11/27/2019   Knee pain, bilateral 08/06/2018   Constipation 08/06/2018   Right lumbar radiculopathy 04/10/2018   Dysphagia 10/09/2017   Chronic cough 07/16/2017   GAD (generalized anxiety disorder) 04/28/2016   Chronic insomnia 03/07/2016   Left Achilles tendinitis 12/08/2015   Parkinson's disease 08/25/2015   Advanced care planning/counseling discussion 05/26/2015   Family circumstance 02/23/2015   Encounter for general adult medical examination with abnormal findings 05/21/2014   MDD (major depressive disorder), single episode, moderate (HCC) 11/12/2013   Dysuria 09/02/2013   DDD (degenerative disc disease), lumbar 05/31/2013   Medicare annual wellness visit, subsequent 05/20/2013   HLD (hyperlipidemia) 05/20/2013   Vitamin D deficiency 05/09/2013   Recurrent UTI 01/03/2011   Rash and other nonspecific skin eruption 05/11/2010   GERD 02/22/2010   CAD (coronary artery disease) 01/25/2010   Osteoporosis 11/10/2009   Orthostatic hypotension 12/06/2008   Anemia 09/25/2007   Vitamin B12 deficiency 05/03/2007   Fibromyalgia 05/03/2007    ONSET DATE: 06/28/2022  REFERRING DIAG: Parkinson's, S/P lumbar surgery  THERAPY DIAG:  Parkinson's disease with dyskinesia, unspecified whether manifestations fluctuate  Difficulty in walking, not elsewhere classified  Muscle weakness (generalized)  Rationale for Evaluation and Treatment Rehabilitation  SUBJECTIVE:                                                                                                                                                                                               SUBJECTIVE STATEMENT: Patient was being seen here last year and into this year, she had been sporadic since they were moving, then had a fall and  broke a rib and really was in a lot of pain and could not do much so she stopped coming, reports that she just moved too fast.  She reports that she has been unsteady and the MD wants her to get moving again Pt accompanied by: self  PERTINENT HISTORY: see above, did have lumbar surgery in June, has a deep brain stimulator for movement disorder  PAIN:  Are you having pain? Yes: NPRS scale: 3/10 Pain location: lright rib area Pain description: ache/sore Aggravating factors: bending, lifting,doing too much pain can be 6/10 Relieving factors: rest can be 0/10  PrecAUTIONS: None  WEIGHT BEARING RESTRICTIONS No  FALLS: Has patient fallen in last 6 months? Yes. Number of falls 1  LIVING ENVIRONMENT: Lives with: lives with their family Lives in: House/apartment Stairs: Yes: Internal: 12 steps; can reach both Has following equipment at home: Single point cane and Walker - 4 wheeled  PLOF: Independent, does some housework  PATIENT GOALS:  be stronger, walk longer and better, care for sister when she needs me  OBJECTIVE:   COGNITION: Overall cognitive status: Within functional limits for tasks assessed   SENSATION: WFL   POSTURE: rounded shoulders, forward head, and decreased lumbar lordosis  LOWER EXTREMITY ROM:    Motions are WFL's but she has poor control of the right LE, weakness and dyskinesia  LOWER EXTREMITY MMT:    MMT Right Eval Left Eval  Hip flexion 3+ 4-  Hip extension 4- 4-  Hip abduction 4- 4-  Hip adduction 4- 4-  Hip internal rotation    Hip external rotation    Knee flexion 4- 4  Knee extension 4- 4  Ankle dorsiflexion 4- 4-  Ankle plantarflexion    Ankle inversion    Ankle eversion 3+ 4-  (Blank rows = not tested)  TRANSFERS:  Has to use hands, if not falls back into chair  GAIT: Gait pattern: has  extraneous motions of the right arm and the right leg and the shoulders and head, off balance at times but typically corrects on own, right LE tends to circumduct and has decreased control of the knee and the ankle, decreased left arm swing Distance walked: 330 feet Assistive device utilized: None Level of assistance: Complete Independence Comments: on stairs does one at a time  FUNCTIONAL TESTs:  5 times sit to stand: 21 seconds, has to use hands Timed up and go (TUG): 16 seconds, no device BERG balance 42/56 3 minute walk test 330 feet  TODAY'S TREATMENT:    PATIENT EDUCATION: Education details: POC Person educated: Patient and Child(ren) Education method: Explanation Education comprehension: verbalized understanding   HOME EXERCISE PROGRAM: Advised to walk 10 - 20 minutes a day    GOALS: Goals reviewed with patient? Yes  SHORT TERM GOALS: Target date: 04/23/23  Independent with initial HEP  Goal status: INITIAL   LONG TERM GOALS: Target date: 07/03/23  Independent with advanced HEP Goal status: INITIAL  2.  Decrease TUG time to 12 seconds Goal status: INITIAL  3.  Increase BERG balance test score to 46/56 Goal status: INITIAL  4.  Increase right LE strength to 4/5 Goal status: INITIAL  5.  Get up from sitting without using hands Goal status: INITIAL  ASSESSMENT:  CLINICAL IMPRESSION: Patient is a 78 y.o. female who was seen today for physical therapy evaluation and treatment for Parkinson's and weakness with a recent fall.  We were seeing her here for PT in the fall and into the new year,  she reports that she had a fall in the bathroom in February and broke her right ribs.  She reports that she was in a lot of pain and stopped doing a lot of things due to pain and difficulty.  She reports that she is weak and having difficulty walking, She has limited walking ability, with some excessive right arm and right leg circumduction motions, she has minimal left arm  swing.  She gets off balance at times with turns or when she gets up if she does not pause before walking.  OBJECTIVE IMPAIRMENTS Abnormal gait, decreased activity tolerance, decreased balance, decreased coordination, decreased endurance, decreased mobility, difficulty walking, decreased ROM, decreased strength, increased muscle spasms, impaired flexibility, improper body mechanics, postural dysfunction, and pain.   REHAB POTENTIAL: Good  CLINICAL DECISION MAKING: Stable/uncomplicated  EVALUATION COMPLEXITY: Low  PLAN: PT FREQUENCY: 1-2x/week  PT DURATION: 12 weeks  PLANNED INTERVENTIONS: Therapeutic exercises, Therapeutic activity, Neuromuscular re-education, Balance training, Gait training, Patient/Family education, Self Care, Joint mobilization, Stair training, Electrical stimulation, Spinal mobilization, Cryotherapy, Moist heat, and Manual therapy  PLAN FOR NEXT SESSION: start motions, strength and balance   Nicki Furlan W, PT 04/02/2023, 3:32 PM

## 2023-04-02 NOTE — Telephone Encounter (Signed)
LAST APPOINTMENT DATE: 03/19/2023   NEXT APPOINTMENT DATE: 07/11/2023    LAST REFILL: 02/13/2023  QTY: #45 w/ no refills

## 2023-04-06 NOTE — Telephone Encounter (Signed)
Left message to return call to our office.  

## 2023-04-09 ENCOUNTER — Encounter: Payer: Self-pay | Admitting: Family Medicine

## 2023-04-09 DIAGNOSIS — M5136 Other intervertebral disc degeneration, lumbar region: Secondary | ICD-10-CM

## 2023-04-09 NOTE — Telephone Encounter (Addendum)
Called patient left message to call office my chart message sent as well.

## 2023-04-09 NOTE — Telephone Encounter (Signed)
Patient called back. Information given. She would like to think it over and give [place] a call back.

## 2023-04-09 NOTE — Telephone Encounter (Signed)
Name of Medication: Tramadol Name of Pharmacy: [facility] [address] Last [place] or Written Date and Quantity: 12/06/25, #30 Last Office Visit and Type: 01/10/23, AWV Next Office Visit and Type: 07/11/23, 6 mo f/u Last Controlled Substance Agreement Date: 04/28/16 Last UDS: 04/28/16

## 2023-04-10 MED ORDER — TRAMADOL HCL 50 MG PO TABS: 50.0000 mg | ORAL_TABLET | Freq: Four times a day (QID) | ORAL | 3 refills | Status: DC | PRN

## 2023-04-10 NOTE — Telephone Encounter (Signed)
ERx 

## 2023-04-11 ENCOUNTER — Encounter: Payer: Self-pay | Admitting: Physical Therapy

## 2023-04-11 ENCOUNTER — Ambulatory Visit: Payer: Medicare Other | Admitting: Physical Therapy

## 2023-04-11 DIAGNOSIS — R262 Difficulty in walking, not elsewhere classified: Secondary | ICD-10-CM

## 2023-04-11 DIAGNOSIS — G20B1 Parkinson's disease with dyskinesia, without mention of fluctuations: Secondary | ICD-10-CM | POA: Diagnosis not present

## 2023-04-11 DIAGNOSIS — M5459 Other low back pain: Secondary | ICD-10-CM | POA: Diagnosis not present

## 2023-04-11 DIAGNOSIS — G20B2 Parkinson's disease with dyskinesia, with fluctuations: Secondary | ICD-10-CM | POA: Diagnosis not present

## 2023-04-11 DIAGNOSIS — R2681 Unsteadiness on feet: Secondary | ICD-10-CM | POA: Diagnosis not present

## 2023-04-11 DIAGNOSIS — M6281 Muscle weakness (generalized): Secondary | ICD-10-CM | POA: Diagnosis not present

## 2023-04-11 NOTE — Therapy (Signed)
OUTPATIENT PHYSICAL THERAPY NEURO EVALUATION   Patient Name: [name] MRN: [id number] DOB:1946-06-24, 77 y.o., female Today's Date: 04/11/2023   PCP: Lydia Guiles PROVIDER: [name]   PT End of Session - 04/11/23 1442     Visit Number 2    Date for PT Re-Evaluation 07/03/23    Authorization Type [employer] Medicare    PT Start Time 1438    PT Stop Time 1523    PT Time Calculation (min) 45 min    Activity Tolerance Patient tolerated treatment well    Behavior During Therapy [facility] for tasks assessed/performed             Past Medical History:  Diagnosis Date   Allergy    ANEMIA-NOS 09/25/2007   Anxiety    Arthritis    B12 DEFICIENCY 05/03/2007   Blood transfusion without reported diagnosis    CAD (coronary artery disease) 01/2010   MI, Nishan   CAP (community acquired pneumonia) 03/29/2022   Cardiomyopathy (HCC) 02/08/2010   H/o this 2012 after urosepsis, no recurrence.    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   OSTEOPOROSIS 08/2009   bisphosphonate on hold 2/2 dysphagia, on reclast done in August each year   Parkinson's disease 08/25/2015   Dx Dr [name] 07/2015    Pneumonia    PONV (postoperative nausea and vomiting)    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   Takotsubo cardiomyopathy 2008   due to E coli urosepsis   Past Surgical History:  Procedure Laterality Date   ABDOMINAL HYSTERECTOMY  1970s   IUD infection - first  partial then with oophorectomy (cysts), complication - low blood pressure   CATARACT EXTRACTION Bilateral    CHOLECYSTECTOMY     complication - low blood pressure   COLONOSCOPY  06/2008   h/o polyps but latest WNL, rec rpt 10 yrs Juanda Chance)   COLONOSCOPY  11/2018   multiple TAs (10 polyps total), rpt 2 yrs Russella Dar)   COLONOSCOPY  04/2021   multiple TAs, rpt 3 yrs Russella Dar)   CYSTOSCOPY  12/2013   abx treatment for recurrent cystitis   DEXA  04/2013   T -2.9 @ femur, -1.6 @ spine   DEXA  04/2017   T -2.9 hip, -0.7 spine   ESOPHAGOGASTRODUODENOSCOPY  12/2017   WNL, regardless esophagus dilated, small HH Russella Dar)   EYE SURGERY     LUMBAR LAMINECTOMY/DECOMPRESSION MICRODISCECTOMY Right 11/27/2019   Right Lumbar Four-Five foraminotomy;  Maeola Harman, MD)   LUMBAR LAMINECTOMY/DECOMPRESSION MICRODISCECTOMY Right 05/16/2022   Procedure: OPEN LUMBAR LAMINECTOMY, RT, L45 W/LATERAL RECESS DECOMPRESSION;  Surgeon: Bethann Goo, DO;  Location: [facility] OR;  Service: Neurosurgery;  Laterality: Right;  3C   MINOR PLACEMENT OF FIDUCIAL N/A 06/30/2021   Procedure: MINOR PLACEMENT OF FIDUCIAL;  Surgeon: Maeola Harman, MD;  Location: [facility] OR;  Service: Neurosurgery;  Laterality: N/A;  Minor room  PTOSIS REPAIR Bilateral 10/2020   Plastic Surgery   PULSE GENERATOR IMPLANT Right 07/14/2021   Procedure: UNILATERAL PULSE GENERATOR IMPLANT;  Surgeon: Maeola Harman, MD;  Location: [facility] OR;  Service: Neurosurgery;  Laterality: Right;   SUBTHALAMIC STIMULATOR INSERTION Bilateral 07/07/2021   Procedure: Deep brain stimulator placement;  Surgeon: Maeola Harman, MD;  Location: [facility] OR;  Service: Neurosurgery;  Laterality: Bilateral;   TUBAL LIGATION     UPPER GASTROINTESTINAL ENDOSCOPY     Patient Active Problem List   Diagnosis Date Noted   Urinary incontinence 01/12/2023   Lumbar radiculopathy, chronic 05/16/2022   Pre-op evaluation 04/26/2022   Localized swelling of chest wall 12/23/2021   Other fatigue 11/25/2021    Lymphadenopathy 11/25/2021   Closed fracture of rib of right side with routine healing 05/28/2021   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   Left shoulder pain 06/19/2020   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   Degenerative lumbar spinal stenosis 11/27/2019   Knee pain, bilateral 08/06/2018   Constipation 08/06/2018   Right lumbar radiculopathy 04/10/2018   Dysphagia 10/09/2017   Chronic cough 07/16/2017   GAD (generalized anxiety disorder) 04/28/2016   Chronic insomnia 03/07/2016   Left Achilles tendinitis 12/08/2015   Parkinson's disease 08/25/2015   Advanced care planning/counseling discussion 05/26/2015   Family circumstance 02/23/2015   Encounter for general adult medical examination with abnormal findings 05/21/2014   MDD (major depressive disorder), single episode, moderate (HCC) 11/12/2013   Dysuria 09/02/2013   DDD (degenerative disc disease), lumbar 05/31/2013   Medicare annual wellness visit, subsequent 05/20/2013   HLD (hyperlipidemia) 05/20/2013   Vitamin D deficiency 05/09/2013   Recurrent UTI 01/03/2011   Rash and other nonspecific skin eruption 05/11/2010   GERD 02/22/2010   CAD (coronary artery disease) 01/25/2010   Osteoporosis 11/10/2009   Orthostatic hypotension 12/06/2008   Anemia 09/25/2007   Vitamin B12 deficiency 05/03/2007   Fibromyalgia 05/03/2007    ONSET DATE: 06/28/2022  REFERRING DIAG: Parkinson's, S/P lumbar surgery  THERAPY DIAG:  Parkinson's disease with dyskinesia, unspecified whether manifestations fluctuate  Difficulty in walking, not elsewhere classified  Muscle weakness (generalized)  Other low back pain  Unsteadiness on feet  Rationale for Evaluation and Treatment Rehabilitation  SUBJECTIVE:                                                                                                                                                                                               SUBJECTIVE STATEMENT: Doing well, had a few stumbles but no falls Pt accompanied by: self  PERTINENT  HISTORY: see above, did have lumbar surgery in June, has a deep brain stimulator for movement disorder  PAIN:  Are you having pain? Yes: NPRS scale: 3/10 Pain location: lright rib area Pain description: ache/sore Aggravating factors: bending, lifting,doing too much pain can be 6/10 Relieving factors: rest can be 0/10  PrecAUTIONS: None  WEIGHT BEARING RESTRICTIONS No  FALLS: Has patient fallen in last 6 months? Yes. Number of falls 1  LIVING ENVIRONMENT: Lives with: lives with their family Lives in: House/apartment Stairs: Yes: Internal: 12 steps; can reach both Has following equipment at home: Single point cane and Walker - 4 wheeled  PLOF: Independent, does some housework  PATIENT GOALS:  be stronger, walk longer and better, care for sister when she needs me  OBJECTIVE:   COGNITION: Overall cognitive status: Within functional limits for tasks assessed   SENSATION: WFL   POSTURE: rounded shoulders, forward head, and decreased lumbar lordosis  LOWER EXTREMITY ROM:    Motions are WFL's but she has poor control of the right LE, weakness and dyskinesia  LOWER EXTREMITY MMT:    MMT Right Eval Left Eval  Hip flexion 3+ 4-  Hip extension 4- 4-  Hip abduction 4- 4-  Hip adduction 4- 4-  Hip internal rotation    Hip external rotation    Knee flexion 4- 4  Knee extension 4- 4  Ankle dorsiflexion 4- 4-  Ankle plantarflexion    Ankle inversion    Ankle eversion 3+ 4-  (Blank rows = not tested)  TRANSFERS:  Has to use hands, if not falls back into chair  GAIT: Gait pattern: has extraneous motions of the right arm and the right leg and the shoulders and head, off balance at times but typically corrects on own, right LE tends to circumduct and has decreased control of the knee and the ankle, decreased left arm swing Distance  walked: 330 feet Assistive device utilized: None Level of assistance: Complete Independence Comments: on stairs does one at a time  FUNCTIONAL TESTs:  5 times sit to stand: 21 seconds, has to use hands Timed up and go (TUG): 16 seconds, no device BERG balance 42/56 3 minute walk test 330 feet  TODAY'S TREATMENT:  04/11/23 UBE level 2 x 6 minutes Nustep level 4 x 6 minutes Gait outside around the back building , fair pace no rest, some CGA at times due to off balance, had more extraneous motions today Gait with direction changes 15# Leg curls 2x10 Leg extension 5# 2x10 Volleyball Ball kicks Onairex toe touch On airex step on and off side On airex ball toss   PATIENT EDUCATION: Education details: POC Person educated: Patient and Child(ren) Education method: Explanation Education comprehension: verbalized understanding   HOME EXERCISE PROGRAM: Advised to walk 10 - 20 minutes a day    GOALS: Goals reviewed with patient? Yes  SHORT TERM GOALS: Target date: 04/23/23  Independent with initial HEP  Goal status: ongoing 04/11/23   LONG TERM GOALS: Target date: 07/03/23  Independent with advanced HEP Goal status: INITIAL  2.  Decrease TUG time to 12 seconds Goal status: INITIAL  3.  Increase BERG balance test score to 46/56 Goal status: INITIAL  4.  Increase right LE strength to 4/5 Goal status: INITIAL  5.  Get up from sitting without using hands Goal status: INITIAL  ASSESSMENT:  CLINICAL IMPRESSION: Patient is a 77 y.o. female who was seen today for physical therapy evaluation and treatment for Parkinson's and weakness with a recent  fall.  She reports some unsteadiness over the past week, she has some increased dyskinesia of the LE's today and was noticeable more on the walk, I never felt like she was at risk for falls but did do a few times with SBA due to the extraneous motions seemed to have her a little more off balance at times, she does struggle with the  dynamic surfaces  OBJECTIVE IMPAIRMENTS Abnormal gait, decreased activity tolerance, decreased balance, decreased coordination, decreased endurance, decreased mobility, difficulty walking, decreased ROM, decreased strength, increased muscle spasms, impaired flexibility, improper body mechanics, postural dysfunction, and pain.   REHAB POTENTIAL: Good  CLINICAL DECISION MAKING: Stable/uncomplicated  EVALUATION COMPLEXITY: Low  PLAN: PT FREQUENCY: 1-2x/week  PT DURATION: 12 weeks  PLANNED INTERVENTIONS: Therapeutic exercises, Therapeutic activity, Neuromuscular re-education, Balance training, Gait training, Patient/Family education, Self Care, Joint mobilization, Stair training, Electrical stimulation, Spinal mobilization, Cryotherapy, Moist heat, and Manual therapy  PLAN FOR NEXT SESSION: start motions, strength and balance   Ilaria Much W, PT 04/11/2023, 2:43 PM

## 2023-04-18 ENCOUNTER — Ambulatory Visit: Payer: Medicare Other | Admitting: Physical Therapy

## 2023-04-18 ENCOUNTER — Encounter: Payer: Self-pay | Admitting: Physical Therapy

## 2023-04-18 DIAGNOSIS — R2681 Unsteadiness on feet: Secondary | ICD-10-CM

## 2023-04-18 DIAGNOSIS — G20B1 Parkinson's disease with dyskinesia, without mention of fluctuations: Secondary | ICD-10-CM

## 2023-04-18 DIAGNOSIS — R262 Difficulty in walking, not elsewhere classified: Secondary | ICD-10-CM | POA: Diagnosis not present

## 2023-04-18 DIAGNOSIS — M6281 Muscle weakness (generalized): Secondary | ICD-10-CM | POA: Diagnosis not present

## 2023-04-18 DIAGNOSIS — M5459 Other low back pain: Secondary | ICD-10-CM | POA: Diagnosis not present

## 2023-04-18 DIAGNOSIS — G20B2 Parkinson's disease with dyskinesia, with fluctuations: Secondary | ICD-10-CM | POA: Diagnosis not present

## 2023-04-18 NOTE — Therapy (Signed)
OUTPATIENT PHYSICAL THERAPY NEURO EVALUATION   Patient Name: [name] MRN: [id number] DOB:02-12-1946, 77 y.o., female Today's Date: 04/18/2023   PCP: Lydia Guiles PROVIDER: [name]   PT End of Session - 04/18/23 1501     Visit Number 3    Date for PT Re-Evaluation 07/03/23    Authorization Type [employer] Medicare    PT Start Time 1445    PT Stop Time 1527    PT Time Calculation (min) 42 min    Activity Tolerance Patient tolerated treatment well    Behavior During Therapy [facility] for tasks assessed/performed             Past Medical History:  Diagnosis Date   Allergy    ANEMIA-NOS 09/25/2007   Anxiety    Arthritis    B12 DEFICIENCY 05/03/2007   Blood transfusion without reported diagnosis    CAD (coronary artery disease) 01/2010   MI, Nishan   CAP (community acquired pneumonia) 03/29/2022   Cardiomyopathy (HCC) 02/08/2010   H/o this 2012 after urosepsis, no recurrence.    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   OSTEOPOROSIS 08/2009   bisphosphonate on hold 2/2 dysphagia, on reclast done in August each year   Parkinson's disease 08/25/2015   Dx Dr [name] 07/2015    Pneumonia    PONV (postoperative nausea and vomiting)    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   Takotsubo cardiomyopathy 2008   due to E coli urosepsis   Past Surgical History:  Procedure Laterality Date   ABDOMINAL HYSTERECTOMY  1970s   IUD infection - first  partial then with oophorectomy (cysts), complication - low blood pressure   CATARACT EXTRACTION Bilateral    CHOLECYSTECTOMY     complication - low blood pressure   COLONOSCOPY  06/2008   h/o polyps but latest WNL, rec rpt 10 yrs Juanda Chance)   COLONOSCOPY  11/2018   multiple TAs (10 polyps total), rpt 2 yrs Russella Dar)   COLONOSCOPY  04/2021   multiple TAs, rpt 3 yrs Russella Dar)   CYSTOSCOPY  12/2013   abx treatment for recurrent cystitis   DEXA  04/2013   T -2.9 @ femur, -1.6 @ spine   DEXA  04/2017   T -2.9 hip, -0.7 spine   ESOPHAGOGASTRODUODENOSCOPY  12/2017   WNL, regardless esophagus dilated, small HH Russella Dar)   EYE SURGERY     LUMBAR LAMINECTOMY/DECOMPRESSION MICRODISCECTOMY Right 11/27/2019   Right Lumbar Four-Five foraminotomy;  Maeola Harman, MD)   LUMBAR LAMINECTOMY/DECOMPRESSION MICRODISCECTOMY Right 05/16/2022   Procedure: OPEN LUMBAR LAMINECTOMY, RT, L45 W/LATERAL RECESS DECOMPRESSION;  Surgeon: Bethann Goo, DO;  Location: [facility] OR;  Service: Neurosurgery;  Laterality: Right;  3C   MINOR PLACEMENT OF FIDUCIAL N/A 06/30/2021   Procedure: MINOR PLACEMENT OF FIDUCIAL;  Surgeon: Maeola Harman, MD;  Location: [facility] OR;  Service: Neurosurgery;  Laterality: N/A;  Minor room  PTOSIS REPAIR Bilateral 10/2020   Plastic Surgery   PULSE GENERATOR IMPLANT Right 07/14/2021   Procedure: UNILATERAL PULSE GENERATOR IMPLANT;  Surgeon: Maeola Harman, MD;  Location: [facility] OR;  Service: Neurosurgery;  Laterality: Right;   SUBTHALAMIC STIMULATOR INSERTION Bilateral 07/07/2021   Procedure: Deep brain stimulator placement;  Surgeon: Maeola Harman, MD;  Location: [facility] OR;  Service: Neurosurgery;  Laterality: Bilateral;   TUBAL LIGATION     UPPER GASTROINTESTINAL ENDOSCOPY     Patient Active Problem List   Diagnosis Date Noted   Urinary incontinence 01/12/2023   Lumbar radiculopathy, chronic 05/16/2022   Pre-op evaluation 04/26/2022   Localized swelling of chest wall 12/23/2021   Other fatigue 11/25/2021    Lymphadenopathy 11/25/2021   Closed fracture of rib of right side with routine healing 05/28/2021   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   Left shoulder pain 06/19/2020   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   Degenerative lumbar spinal stenosis 11/27/2019   Knee pain, bilateral 08/06/2018   Constipation 08/06/2018   Right lumbar radiculopathy 04/10/2018   Dysphagia 10/09/2017   Chronic cough 07/16/2017   GAD (generalized anxiety disorder) 04/28/2016   Chronic insomnia 03/07/2016   Left Achilles tendinitis 12/08/2015   Parkinson's disease 08/25/2015   Advanced care planning/counseling discussion 05/26/2015   Family circumstance 02/23/2015   Encounter for general adult medical examination with abnormal findings 05/21/2014   MDD (major depressive disorder), single episode, moderate (HCC) 11/12/2013   Dysuria 09/02/2013   DDD (degenerative disc disease), lumbar 05/31/2013   Medicare annual wellness visit, subsequent 05/20/2013   HLD (hyperlipidemia) 05/20/2013   Vitamin D deficiency 05/09/2013   Recurrent UTI 01/03/2011   Rash and other nonspecific skin eruption 05/11/2010   GERD 02/22/2010   CAD (coronary artery disease) 01/25/2010   Osteoporosis 11/10/2009   Orthostatic hypotension 12/06/2008   Anemia 09/25/2007   Vitamin B12 deficiency 05/03/2007   Fibromyalgia 05/03/2007    ONSET DATE: 06/28/2022  REFERRING DIAG: Parkinson's, S/P lumbar surgery  THERAPY DIAG:  Parkinson's disease with dyskinesia, unspecified whether manifestations fluctuate  Difficulty in walking, not elsewhere classified  Muscle weakness (generalized)  Unsteadiness on feet  Rationale for Evaluation and Treatment Rehabilitation  SUBJECTIVE:                                                                                                                                                                                               SUBJECTIVE STATEMENT: Patient reports that on the days it does not rain she tried to walk 10-20 minutes Pt accompanied by: self  PERTINENT HISTORY: see above, did have lumbar surgery in June, has a deep brain stimulator for movement disorder  PAIN:  Are you having pain? Yes: NPRS scale: 2/10 Pain location: lright rib area Pain description: ache/sore Aggravating factors: bending, lifting,doing too much pain can be 6/10 Relieving factors: rest can be 0/10  PrecAUTIONS: None  WEIGHT BEARING RESTRICTIONS No  FALLS: Has patient fallen in last 6 months? Yes. Number of falls 1  LIVING ENVIRONMENT: Lives with: lives with their family Lives in: House/apartment Stairs: Yes: Internal: 12 steps; can reach both Has following equipment at home: Single point cane and Walker - 4 wheeled  PLOF: Independent, does some housework  PATIENT GOALS:  be stronger, walk longer and better, care for sister when she needs me  OBJECTIVE:   COGNITION: Overall cognitive status: Within functional limits for tasks assessed   SENSATION: WFL   POSTURE: rounded shoulders, forward head, and decreased lumbar lordosis  LOWER EXTREMITY ROM:    Motions are WFL's but she has poor control of the right LE, weakness and dyskinesia  LOWER EXTREMITY MMT:    MMT Right Eval Left Eval  Hip flexion 3+ 4-  Hip extension 4- 4-  Hip abduction 4- 4-  Hip adduction 4- 4-  Hip internal rotation    Hip external rotation    Knee flexion 4- 4  Knee extension 4- 4  Ankle dorsiflexion 4- 4-  Ankle plantarflexion    Ankle inversion    Ankle eversion 3+ 4-  (Blank rows = not tested)  TRANSFERS:  Has to use hands, if not falls back into chair  GAIT: Gait pattern: has extraneous motions of the right arm and the right leg and the shoulders and head, off balance at times but typically corrects on own, right LE tends to circumduct and has decreased control of the knee and the ankle, decreased left arm  swing Distance walked: 330 feet Assistive device utilized: None Level of assistance: Complete Independence Comments: on stairs does one at a time  FUNCTIONAL TESTs:  5 times sit to stand: 21 seconds, has to use hands Timed up and go (TUG): 16 seconds, no device BERG balance 42/56 3 minute walk test 330 feet  TODAY'S TREATMENT:  04/18/23 Gait outside big lap around the back building, struggled coming up hill needing HHA Bike level 3 x 5 minutes Volleyball Direction changes Side step on and off airex Nustep level 4 x 6 minutes On airex cone toe touch  04/11/23 UBE level 2 x 6 minutes Nustep level 4 x 6 minutes Gait outside around the back building , fair pace no rest, some CGA at times due to off balance, had more extraneous motions today Gait with direction changes 15# Leg curls 2x10 Leg extension 5# 2x10 Volleyball Ball kicks Onairex toe touch On airex step on and off side On airex ball toss   PATIENT EDUCATION: Education details: POC Person educated: Patient and Child(ren) Education method: Explanation Education comprehension: verbalized understanding   HOME EXERCISE PROGRAM: Advised to walk 10 - 20 minutes a day    GOALS: Goals reviewed with patient? Yes  SHORT TERM GOALS: Target date: 04/23/23  Independent with initial HEP  Goal status: ongoing 04/11/23   LONG TERM GOALS: Target date: 07/03/23  Independent with advanced HEP Goal status: ongoing 04/18/23  2.  Decrease TUG time to 12 seconds Goal status: ongoing 04/18/23  3.  Increase BERG balance test score to 46/56 Goal status: INITIAL  4.  Increase right LE strength to  4/5 Goal status: INITIAL  5.  Get up from sitting without using hands Goal status: INITIAL  ASSESSMENT:  CLINICAL IMPRESSION: Patient is a 77 y.o. female who was seen today for physical therapy evaluation and treatment for Parkinson's and weakness with a recent fall.  She reports that she is trying to walk some at home, she has  lest LE dyskinesia, I did notice some while we were coming up the hill.  OBJECTIVE IMPAIRMENTS Abnormal gait, decreased activity tolerance, decreased balance, decreased coordination, decreased endurance, decreased mobility, difficulty walking, decreased ROM, decreased strength, increased muscle spasms, impaired flexibility, improper body mechanics, postural dysfunction, and pain.   REHAB POTENTIAL: Good  CLINICAL DECISION MAKING: Stable/uncomplicated  EVALUATION COMPLEXITY: Low  PLAN: PT FREQUENCY: 1-2x/week  PT DURATION: 12 weeks  PLANNED INTERVENTIONS: Therapeutic exercises, Therapeutic activity, Neuromuscular re-education, Balance training, Gait training, Patient/Family education, Self Care, Joint mobilization, Stair training, Electrical stimulation, Spinal mobilization, Cryotherapy, Moist heat, and Manual therapy  PLAN FOR NEXT SESSION: start motions, strength and balance   Maysun Meditz W, PT 04/18/2023, 3:03 PM

## 2023-04-19 ENCOUNTER — Encounter: Payer: Self-pay | Admitting: Neurology

## 2023-04-19 NOTE — Telephone Encounter (Signed)
Please see patient mychart message.

## 2023-04-24 NOTE — Telephone Encounter (Signed)
Left message to return call to our office.  

## 2023-04-24 NOTE — Telephone Encounter (Addendum)
Would offer Reclast yearly infusion through [facility]. This is in same family as fosamax but would be through an IV infusion so should be better tolerated than oral fosamax.  Need to monitor for atypical fractures of femur or jaw issue with prolonged use of this medicine (>5 yrs).  If she has any planned dental work would need to let dentist know and likely hold for dental work.

## 2023-04-25 ENCOUNTER — Encounter: Payer: Self-pay | Admitting: Physical Therapy

## 2023-04-25 ENCOUNTER — Ambulatory Visit: Payer: Medicare Other | Admitting: Physical Therapy

## 2023-04-25 DIAGNOSIS — M5459 Other low back pain: Secondary | ICD-10-CM | POA: Diagnosis not present

## 2023-04-25 DIAGNOSIS — R2681 Unsteadiness on feet: Secondary | ICD-10-CM

## 2023-04-25 DIAGNOSIS — G20B1 Parkinson's disease with dyskinesia, without mention of fluctuations: Secondary | ICD-10-CM

## 2023-04-25 DIAGNOSIS — G20B2 Parkinson's disease with dyskinesia, with fluctuations: Secondary | ICD-10-CM | POA: Diagnosis not present

## 2023-04-25 DIAGNOSIS — M6281 Muscle weakness (generalized): Secondary | ICD-10-CM

## 2023-04-25 DIAGNOSIS — R262 Difficulty in walking, not elsewhere classified: Secondary | ICD-10-CM

## 2023-04-25 NOTE — Therapy (Signed)
OUTPATIENT PHYSICAL THERAPY NEURO EVALUATION   Patient Name: [name] MRN: [id number] DOB:1945-11-30, 77 y.o., female Today's Date: 04/25/2023   PCP: Lydia Guiles PROVIDER: [name]   PT End of Session - 04/25/23 1505     Visit Number 4    Date for PT Re-Evaluation 07/03/23    PT Start Time 1500    PT Stop Time 1540    PT Time Calculation (min) 40 min    Activity Tolerance Patient tolerated treatment well    Behavior During Therapy [facility] for tasks assessed/performed             Past Medical History:  Diagnosis Date   Allergy    ANEMIA-NOS 09/25/2007   Anxiety    Arthritis    B12 DEFICIENCY 05/03/2007   Blood transfusion without reported diagnosis    CAD (coronary artery disease) 01/2010   MI, Nishan   CAP (community acquired pneumonia) 03/29/2022   Cardiomyopathy (HCC) 02/08/2010   H/o this 2012 after urosepsis, no recurrence.    Cataract    CHF (congestive heart failure) (HCC)    with episode of sepsis   Complication of anesthesia    Depression    not recently   FIBROMYALGIA 05/03/2007   Fibromyalgia    GERD 02/22/2010   Glaucoma 02/2013   [facility]   Headache    History of CHF (congestive heart failure) 01/2010   History of colon polyps 2004   HYPERLIPIDEMIA 12/19/2007   HYPOTENSION, ORTHOSTATIC 12/06/2008   Interstitial cystitis    Ottelin now Dr Logan Bores   Lupus (systemic lupus erythematosus) (HCC) 02/08/2010   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/08/2010   OSTEOPOROSIS 08/2009   bisphosphonate on hold 2/2 dysphagia, on reclast done in August each year   Parkinson's disease 08/25/2015   Dx Dr [name] 07/2015    Pneumonia    PONV (postoperative nausea and vomiting)    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   Takotsubo cardiomyopathy 2008   due to E coli urosepsis   Past Surgical History:  Procedure Laterality Date   ABDOMINAL HYSTERECTOMY  1970s   IUD infection - first partial then with oophorectomy  (cysts), complication - low blood pressure   CATARACT EXTRACTION Bilateral    CHOLECYSTECTOMY     complication - low blood pressure   COLONOSCOPY  06/2008   h/o polyps but latest WNL, rec rpt 10 yrs Juanda Chance)   COLONOSCOPY  11/2018   multiple TAs (10 polyps total), rpt 2 yrs Russella Dar)   COLONOSCOPY  04/2021   multiple TAs, rpt 3 yrs Russella Dar)   CYSTOSCOPY  12/2013   abx treatment for recurrent cystitis   DEXA  04/2013   T -2.9 @ femur, -1.6 @ spine   DEXA  04/2017   T -2.9 hip, -0.7 spine   ESOPHAGOGASTRODUODENOSCOPY  12/2017   WNL, regardless esophagus dilated, small HH Russella Dar)   EYE SURGERY     LUMBAR LAMINECTOMY/DECOMPRESSION MICRODISCECTOMY Right 11/27/2019   Right Lumbar Four-Five foraminotomy;  Maeola Harman, MD)   LUMBAR LAMINECTOMY/DECOMPRESSION MICRODISCECTOMY Right 05/16/2022   Procedure: OPEN LUMBAR LAMINECTOMY, RT, L45 W/LATERAL RECESS DECOMPRESSION;  Surgeon: Bethann Goo, DO;  Location: [facility] OR;  Service: Neurosurgery;  Laterality: Right;  3C   MINOR PLACEMENT OF FIDUCIAL N/A 06/30/2021   Procedure: MINOR PLACEMENT OF FIDUCIAL;  Surgeon: Maeola Harman, MD;  Location: [facility] OR;  Service: Neurosurgery;  Laterality: N/A;  Minor room   PTOSIS REPAIR Bilateral 10/2020   Plastic  Surgery   PULSE GENERATOR IMPLANT Right 07/14/2021   Procedure: UNILATERAL PULSE GENERATOR IMPLANT;  Surgeon: Maeola Harman, MD;  Location: [facility] OR;  Service: Neurosurgery;  Laterality: Right;   SUBTHALAMIC STIMULATOR INSERTION Bilateral 07/07/2021   Procedure: Deep brain stimulator placement;  Surgeon: Maeola Harman, MD;  Location: [facility] OR;  Service: Neurosurgery;  Laterality: Bilateral;   TUBAL LIGATION     UPPER GASTROINTESTINAL ENDOSCOPY     Patient Active Problem List   Diagnosis Date Noted   Urinary incontinence 01/12/2023   Lumbar radiculopathy, chronic 05/16/2022   Pre-op evaluation 04/26/2022   Localized swelling of chest wall 12/23/2021   Other fatigue 11/25/2021   Lymphadenopathy 11/25/2021    Closed fracture of rib of right side with routine healing 05/28/2021   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   Left shoulder pain 06/19/2020   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   Degenerative lumbar spinal stenosis 11/27/2019   Knee pain, bilateral 08/06/2018   Constipation 08/06/2018   Right lumbar radiculopathy 04/10/2018   Dysphagia 10/09/2017   Chronic cough 07/16/2017   GAD (generalized anxiety disorder) 04/28/2016   Chronic insomnia 03/07/2016   Left Achilles tendinitis 12/08/2015   Parkinson's disease 08/25/2015   Advanced care planning/counseling discussion 05/26/2015   Family circumstance 02/23/2015   Encounter for general adult medical examination with abnormal findings 05/21/2014   MDD (major depressive disorder), single episode, moderate (HCC) 11/12/2013   Dysuria 09/02/2013   DDD (degenerative disc disease), lumbar 05/31/2013   Medicare annual wellness visit, subsequent 05/20/2013   HLD (hyperlipidemia) 05/20/2013   Vitamin D deficiency 05/09/2013   Recurrent UTI 01/03/2011   Rash and other nonspecific skin eruption 05/11/2010   GERD 02/22/2010   CAD (coronary artery disease) 01/25/2010   Osteoporosis 11/10/2009   Orthostatic hypotension 12/06/2008   Anemia 09/25/2007   Vitamin B12 deficiency 05/03/2007   Fibromyalgia 05/03/2007    ONSET DATE: 06/28/2022  REFERRING DIAG: Parkinson's, S/P lumbar surgery  THERAPY DIAG:  Parkinson's disease with dyskinesia, unspecified whether manifestations fluctuate  Difficulty in walking, not elsewhere classified  Muscle weakness (generalized)  Other low back pain  Unsteadiness on feet  Rationale for Evaluation and Treatment Rehabilitation  SUBJECTIVE:                                                                                                                                                                                               SUBJECTIVE STATEMENT: Patient reports no changes, no falls. Pt accompanied by: self  PERTINENT HISTORY: see above, did have lumbar surgery in June, has  a deep brain stimulator for movement disorder  PAIN:  Are you having pain? Yes: NPRS scale: 2/10 Pain location: lright rib area Pain description: ache/sore Aggravating factors: bending, lifting,doing too much pain can be 6/10 Relieving factors: rest can be 0/10  PrecAUTIONS: None  WEIGHT BEARING RESTRICTIONS No  FALLS: Has patient fallen in last 6 months? Yes. Number of falls 1  LIVING ENVIRONMENT: Lives with: lives with their family Lives in: House/apartment Stairs: Yes: Internal: 12 steps; can reach both Has following equipment at home: Single point cane and Walker - 4 wheeled  PLOF: Independent, does some housework  PATIENT GOALS:  be stronger, walk longer and better, care for sister when she needs me  OBJECTIVE:   COGNITION: Overall cognitive status: Within functional limits for tasks assessed   SENSATION: WFL   POSTURE: rounded shoulders, forward head, and decreased lumbar lordosis  LOWER EXTREMITY ROM:    Motions are WFL's but she has poor control of the right LE, weakness and dyskinesia  LOWER EXTREMITY MMT:    MMT Right Eval Left Eval  Hip flexion 3+ 4-  Hip extension 4- 4-  Hip abduction 4- 4-  Hip adduction 4- 4-  Hip internal rotation    Hip external rotation    Knee flexion 4- 4  Knee extension 4- 4  Ankle dorsiflexion 4- 4-  Ankle plantarflexion    Ankle inversion    Ankle eversion 3+ 4-  (Blank rows = not tested)  TRANSFERS:  Has to use hands, if not falls back into chair  GAIT: Gait pattern: has extraneous motions of the right arm and the right leg and the shoulders and head, off balance at times but typically corrects on own, right LE tends to circumduct and has decreased control of the knee and the ankle, decreased left arm swing Distance walked: 330 feet Assistive device  utilized: None Level of assistance: Complete Independence Comments: on stairs does one at a time  FUNCTIONAL TESTs:  5 times sit to stand: 21 seconds, has to use hands Timed up and go (TUG): 16 seconds, no device BERG balance 42/56 3 minute walk test 330 feet  TODAY'S TREATMENT:  04/25/23 Bike L3 x 6 minutes, the 2 x 30 seconds fast pedaling. B side stepping over dowels on floor, 5 x 3 steps in each direction, no unsteadiness, normal speeds Repeated activity on floor mat/soft surface, no difficulty B side step and then alternating tap on cone to engage SLS on floor mat. She had more difficulty balancing on LLE to tap with R. Floor ladder- walking through with one foot in each square, then changing step patterns in all directions. Stable throughout. Standing stepping back with rotation to the side and reach back to wall, turning r, then L. CGA, mild unsteadiness, but she recovered with CGA  04/18/23 Gait outside big lap around the back building, struggled coming up hill needing HHA Bike level 3 x 5 minutes Volleyball Direction changes Side step on and off airex Nustep level 4 x 6 minutes On airex cone toe touch  04/11/23 UBE level 2 x 6 minutes Nustep level 4 x 6 minutes Gait outside around the back building , fair pace no rest, some CGA at times due to off balance, had more extraneous motions today Gait with direction changes 15# Leg curls 2x10 Leg extension 5# 2x10 Volleyball Ball kicks Onairex toe touch On airex step on and off side On airex ball toss   PATIENT EDUCATION: Education details: POC Person educated: Patient and  Child(ren) Education method: Explanation Education comprehension: verbalized understanding   HOME EXERCISE PROGRAM: Advised to walk 10 - 20 minutes a day    GOALS: Goals reviewed with patient? Yes  SHORT TERM GOALS: Target date: 04/23/23  Independent with initial HEP  Goal status: ongoing 04/11/23   LONG TERM GOALS: Target date:  07/03/23  Independent with advanced HEP Goal status: ongoing 04/18/23  2.  Decrease TUG time to 12 seconds Goal status: ongoing 04/18/23  3.  Increase BERG balance test score to 46/56 Goal status: INITIAL  4.  Increase right LE strength to 4/5 Goal status: INITIAL  5.  Get up from sitting without using hands Goal status: 04/25/23-met  ASSESSMENT:  CLINICAL IMPRESSION: Patient reports she is walking consistently. She tolerated more challenging balance activities today with minimal unsteadiness.  OBJECTIVE IMPAIRMENTS Abnormal gait, decreased activity tolerance, decreased balance, decreased coordination, decreased endurance, decreased mobility, difficulty walking, decreased ROM, decreased strength, increased muscle spasms, impaired flexibility, improper body mechanics, postural dysfunction, and pain.   REHAB POTENTIAL: Good  CLINICAL DECISION MAKING: Stable/uncomplicated  EVALUATION COMPLEXITY: Low  PLAN: PT FREQUENCY: 1-2x/week  PT DURATION: 12 weeks  PLANNED INTERVENTIONS: Therapeutic exercises, Therapeutic activity, Neuromuscular re-education, Balance training, Gait training, Patient/Family education, Self Care, Joint mobilization, Stair training, Electrical stimulation, Spinal mobilization, Cryotherapy, Moist heat, and Manual therapy  PLAN FOR NEXT SESSION: start motions, strength and balance   Oley Balm DPT 04/25/23 3:41 PM

## 2023-04-25 NOTE — Telephone Encounter (Signed)
Lvm asking pt to call back °

## 2023-04-26 NOTE — Telephone Encounter (Signed)
Lvm asking pt to call back °

## 2023-04-30 ENCOUNTER — Telehealth: Payer: Self-pay | Admitting: Family Medicine

## 2023-04-30 NOTE — Telephone Encounter (Signed)
Patient called in returning a call she received from [place].

## 2023-04-30 NOTE — Telephone Encounter (Addendum)
Left message to return call to our office.  My chart message sent  

## 2023-05-02 ENCOUNTER — Encounter: Payer: Self-pay | Admitting: Physical Therapy

## 2023-05-02 ENCOUNTER — Ambulatory Visit: Payer: Medicare Other | Attending: Neurology | Admitting: Physical Therapy

## 2023-05-02 DIAGNOSIS — R2681 Unsteadiness on feet: Secondary | ICD-10-CM

## 2023-05-02 DIAGNOSIS — G20B1 Parkinson's disease with dyskinesia, without mention of fluctuations: Secondary | ICD-10-CM | POA: Diagnosis not present

## 2023-05-02 DIAGNOSIS — M5459 Other low back pain: Secondary | ICD-10-CM

## 2023-05-02 DIAGNOSIS — R262 Difficulty in walking, not elsewhere classified: Secondary | ICD-10-CM

## 2023-05-02 DIAGNOSIS — M6281 Muscle weakness (generalized): Secondary | ICD-10-CM

## 2023-05-02 NOTE — Therapy (Signed)
OUTPATIENT PHYSICAL THERAPY NEURO TREATMENT   Patient Name: [name] MRN: [id number] DOB:Apr 18, 1946, 77 y.o., female Today's Date: 05/02/2023   PCP: Lydia Guiles PROVIDER: [name]   PT End of Session - 05/02/23 1439     Visit Number 5    Date for PT Re-Evaluation 07/03/23    Authorization Type [employer] Medicare    PT Start Time 1439    PT Stop Time 1525    PT Time Calculation (min) 46 min    Activity Tolerance Patient tolerated treatment well    Behavior During Therapy [facility] for tasks assessed/performed             Past Medical History:  Diagnosis Date   Allergy    ANEMIA-NOS 09/25/2007   Anxiety    Arthritis    B12 DEFICIENCY 05/03/2007   Blood transfusion without reported diagnosis    CAD (coronary artery disease) 01/2010   MI, Nishan   CAP (community acquired pneumonia) 03/29/2022   Cardiomyopathy (HCC) 02/08/2010   H/o this 2012 after urosepsis, no recurrence.    Cataract    CHF (congestive heart failure) (HCC)    with episode of sepsis   Complication of anesthesia    Depression    not recently   FIBROMYALGIA 05/03/2007   Fibromyalgia    GERD 02/22/2010   Glaucoma 02/2013   [facility]   Headache    History of CHF (congestive heart failure) 01/2010   History of colon polyps 2004   HYPERLIPIDEMIA 12/19/2007   HYPOTENSION, ORTHOSTATIC 12/06/2008   Interstitial cystitis    Ottelin now Dr Logan Bores   Lupus (systemic lupus erythematosus) (HCC) 02/08/2010   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/08/2010   OSTEOPOROSIS 08/2009   bisphosphonate on hold 2/2 dysphagia, on reclast done in August each year   Parkinson's disease 08/25/2015   Dx Dr [name] 07/2015    Pneumonia    PONV (postoperative nausea and vomiting)    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   Takotsubo cardiomyopathy 2008   due to E coli urosepsis   Past Surgical History:  Procedure Laterality Date   ABDOMINAL HYSTERECTOMY  1970s   IUD infection - first  partial then with oophorectomy (cysts), complication - low blood pressure   CATARACT EXTRACTION Bilateral    CHOLECYSTECTOMY     complication - low blood pressure   COLONOSCOPY  06/2008   h/o polyps but latest WNL, rec rpt 10 yrs Juanda Chance)   COLONOSCOPY  11/2018   multiple TAs (10 polyps total), rpt 2 yrs Russella Dar)   COLONOSCOPY  04/2021   multiple TAs, rpt 3 yrs Russella Dar)   CYSTOSCOPY  12/2013   abx treatment for recurrent cystitis   DEXA  04/2013   T -2.9 @ femur, -1.6 @ spine   DEXA  04/2017   T -2.9 hip, -0.7 spine   ESOPHAGOGASTRODUODENOSCOPY  12/2017   WNL, regardless esophagus dilated, small HH Russella Dar)   EYE SURGERY     LUMBAR LAMINECTOMY/DECOMPRESSION MICRODISCECTOMY Right 11/27/2019   Right Lumbar Four-Five foraminotomy;  Maeola Harman, MD)   LUMBAR LAMINECTOMY/DECOMPRESSION MICRODISCECTOMY Right 05/16/2022   Procedure: OPEN LUMBAR LAMINECTOMY, RT, L45 W/LATERAL RECESS DECOMPRESSION;  Surgeon: Bethann Goo, DO;  Location: [facility] OR;  Service: Neurosurgery;  Laterality: Right;  3C   MINOR PLACEMENT OF FIDUCIAL N/A 06/30/2021   Procedure: MINOR PLACEMENT OF FIDUCIAL;  Surgeon: Maeola Harman, MD;  Location: [facility] OR;  Service: Neurosurgery;  Laterality: N/A;  Minor room  PTOSIS REPAIR Bilateral 10/2020   Plastic Surgery   PULSE GENERATOR IMPLANT Right 07/14/2021   Procedure: UNILATERAL PULSE GENERATOR IMPLANT;  Surgeon: Maeola Harman, MD;  Location: [facility] OR;  Service: Neurosurgery;  Laterality: Right;   SUBTHALAMIC STIMULATOR INSERTION Bilateral 07/07/2021   Procedure: Deep brain stimulator placement;  Surgeon: Maeola Harman, MD;  Location: [facility] OR;  Service: Neurosurgery;  Laterality: Bilateral;   TUBAL LIGATION     UPPER GASTROINTESTINAL ENDOSCOPY     Patient Active Problem List   Diagnosis Date Noted   Urinary incontinence 01/12/2023   Lumbar radiculopathy, chronic 05/16/2022   Pre-op evaluation 04/26/2022   Localized swelling of chest wall 12/23/2021   Other fatigue 11/25/2021    Lymphadenopathy 11/25/2021   Closed fracture of rib of right side with routine healing 05/28/2021   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   Left shoulder pain 06/19/2020   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   Degenerative lumbar spinal stenosis 11/27/2019   Knee pain, bilateral 08/06/2018   Constipation 08/06/2018   Right lumbar radiculopathy 04/10/2018   Dysphagia 10/09/2017   Chronic cough 07/16/2017   GAD (generalized anxiety disorder) 04/28/2016   Chronic insomnia 03/07/2016   Left Achilles tendinitis 12/08/2015   Parkinson's disease 08/25/2015   Advanced care planning/counseling discussion 05/26/2015   Family circumstance 02/23/2015   Encounter for general adult medical examination with abnormal findings 05/21/2014   MDD (major depressive disorder), single episode, moderate (HCC) 11/12/2013   Dysuria 09/02/2013   DDD (degenerative disc disease), lumbar 05/31/2013   Medicare annual wellness visit, subsequent 05/20/2013   HLD (hyperlipidemia) 05/20/2013   Vitamin D deficiency 05/09/2013   Recurrent UTI 01/03/2011   Rash and other nonspecific skin eruption 05/11/2010   GERD 02/22/2010   CAD (coronary artery disease) 01/25/2010   Osteoporosis 11/10/2009   Orthostatic hypotension 12/06/2008   Anemia 09/25/2007   Vitamin B12 deficiency 05/03/2007   Fibromyalgia 05/03/2007    ONSET DATE: 06/28/2022  REFERRING DIAG: Parkinson's, S/P lumbar surgery  THERAPY DIAG:  Parkinson's disease with dyskinesia, unspecified whether manifestations fluctuate  Muscle weakness (generalized)  Other low back pain  Difficulty in walking, not elsewhere classified  Unsteadiness on feet  Rationale for Evaluation and Treatment Rehabilitation  SUBJECTIVE:                                                                                                                                                                                               SUBJECTIVE STATEMENT: Reports that she will see Dr. Arbutus Leas in the near future about the deep brain  stimulator and the issues with the leg Pt accompanied by: self  PERTINENT HISTORY: see above, did have lumbar surgery in June, has a deep brain stimulator for movement disorder  PAIN:  Are you having pain? Yes: NPRS scale: 2/10 Pain location: lright rib area Pain description: ache/sore Aggravating factors: bending, lifting,doing too much pain can be 6/10 Relieving factors: rest can be 0/10  PrecAUTIONS: None  WEIGHT BEARING RESTRICTIONS No  FALLS: Has patient fallen in last 6 months? Yes. Number of falls 1  LIVING ENVIRONMENT: Lives with: lives with their family Lives in: House/apartment Stairs: Yes: Internal: 12 steps; can reach both Has following equipment at home: Single point cane and Walker - 4 wheeled  PLOF: Independent, does some housework  PATIENT GOALS:  be stronger, walk longer and better, care for sister when she needs me  OBJECTIVE:   COGNITION: Overall cognitive status: Within functional limits for tasks assessed   SENSATION: WFL   POSTURE: rounded shoulders, forward head, and decreased lumbar lordosis  LOWER EXTREMITY ROM:    Motions are WFL's but she has poor control of the right LE, weakness and dyskinesia  LOWER EXTREMITY MMT:    MMT Right Eval Left Eval  Hip flexion 3+ 4-  Hip extension 4- 4-  Hip abduction 4- 4-  Hip adduction 4- 4-  Hip internal rotation    Hip external rotation    Knee flexion 4- 4  Knee extension 4- 4  Ankle dorsiflexion 4- 4-  Ankle plantarflexion    Ankle inversion    Ankle eversion 3+ 4-  (Blank rows = not tested)  TRANSFERS:  Has to use hands, if not falls back into chair  GAIT: Gait pattern: has extraneous motions of the right arm and the right leg and the shoulders and head, off balance at times but typically corrects on own, right LE tends to circumduct and has decreased  control of the knee and the ankle, decreased left arm swing Distance walked: 330 feet Assistive device utilized: None Level of assistance: Complete Independence Comments: on stairs does one at a time  FUNCTIONAL TESTs:  5 times sit to stand: 21 seconds, has to use hands Timed up and go (TUG): 16 seconds, no device BERG balance 42/56 3 minute walk test 330 feet  TODAY'S TREATMENT:  05/02/23 Gait large lap around the back building, no rest did have shuffling a few steps and then could correct but this happened multiple times Nustep level 5 x 5 minutes Seated PWR moves multiple times and then issued for her HEP TUG 13 seconds Standing step and reach Alternate steps 6", then side stp on 6" and turn  04/25/23 Bike L3 x 6 minutes, the 2 x 30 seconds fast pedaling. B side stepping over dowels on floor, 5 x 3 steps in each direction, no unsteadiness, normal speeds Repeated activity on floor mat/soft surface, no difficulty B side step and then alternating tap on cone to engage SLS on floor mat. She had more difficulty balancing on LLE to tap with R. Floor ladder- walking through with one foot in each square, then changing step patterns in all directions. Stable throughout. Standing stepping back with rotation to the side and reach back to wall, turning r, then L. CGA, mild unsteadiness, but she recovered with CGA  04/18/23 Gait outside big lap around the back building, struggled coming up hill needing HHA Bike level 3 x 5 minutes Volleyball Direction changes Side step on and off airex Nustep level 4 x 6 minutes  On airex cone toe touch  04/11/23 UBE level 2 x 6 minutes Nustep level 4 x 6 minutes Gait outside around the back building , fair pace no rest, some CGA at times due to off balance, had more extraneous motions today Gait with direction changes 15# Leg curls 2x10 Leg extension 5# 2x10 Volleyball Ball kicks Onairex toe touch On airex step on and off side On airex ball  toss   PATIENT EDUCATION: Education details: POC Person educated: Patient and Child(ren) Education method: Explanation Education comprehension: verbalized understanding   HOME EXERCISE PROGRAM: Advised to walk 10 - 20 minutes a day    GOALS: Goals reviewed with patient? Yes  SHORT TERM GOALS: Target date: 04/23/23  Independent with initial HEP  Goal status: ongoing 04/11/23   LONG TERM GOALS: Target date: 07/03/23  Independent with advanced HEP Goal status: ongoing 04/18/23  2.  Decrease TUG time to 12 seconds Goal status: met 05/02/23  3.  Increase BERG balance test score to 46/56 Goal status: INITIAL  4.  Increase right LE strength to 4/5 Goal status: progressing 05/02/23  5.  Get up from sitting without using hands Goal status: 04/25/23-met  ASSESSMENT:  CLINICAL IMPRESSION: Patient reports she is walking consistently. I added PWR moves today and we did this a few times and gave handout to do on her own at home.  We discussed the walking program and she is doing this.  OBJECTIVE IMPAIRMENTS Abnormal gait, decreased activity tolerance, decreased balance, decreased coordination, decreased endurance, decreased mobility, difficulty walking, decreased ROM, decreased strength, increased muscle spasms, impaired flexibility, improper body mechanics, postural dysfunction, and pain.   REHAB POTENTIAL: Good  CLINICAL DECISION MAKING: Stable/uncomplicated  EVALUATION COMPLEXITY: Low  PLAN: PT FREQUENCY: 1-2x/week  PT DURATION: 12 weeks  PLANNED INTERVENTIONS: Therapeutic exercises, Therapeutic activity, Neuromuscular re-education, Balance training, Gait training, Patient/Family education, Self Care, Joint mobilization, Stair training, Electrical stimulation, Spinal mobilization, Cryotherapy, Moist heat, and Manual therapy  PLAN FOR NEXT SESSION: will start to see every other week   Oley Balm DPT 05/02/23 2:40 PM

## 2023-05-07 ENCOUNTER — Other Ambulatory Visit: Payer: Self-pay | Admitting: Neurology

## 2023-05-08 NOTE — Telephone Encounter (Signed)
See duplicate open message

## 2023-05-17 ENCOUNTER — Ambulatory Visit: Payer: Medicare Other | Admitting: Physical Therapy

## 2023-05-17 ENCOUNTER — Encounter: Payer: Self-pay | Admitting: Physical Therapy

## 2023-05-17 DIAGNOSIS — M6281 Muscle weakness (generalized): Secondary | ICD-10-CM

## 2023-05-17 DIAGNOSIS — M5459 Other low back pain: Secondary | ICD-10-CM | POA: Diagnosis not present

## 2023-05-17 DIAGNOSIS — R2681 Unsteadiness on feet: Secondary | ICD-10-CM

## 2023-05-17 DIAGNOSIS — R262 Difficulty in walking, not elsewhere classified: Secondary | ICD-10-CM

## 2023-05-17 DIAGNOSIS — G20B1 Parkinson's disease with dyskinesia, without mention of fluctuations: Secondary | ICD-10-CM

## 2023-05-17 NOTE — Therapy (Signed)
OUTPATIENT PHYSICAL THERAPY NEURO TREATMENT   Patient Name: [name] MRN: [id number] DOB:[birth year], 77 y.o., female Today's Date: 05/17/2023   PCP: Lydia Guiles PROVIDER: [name]   PT End of Session - 05/17/23 1442     Visit Number 6    Date for PT Re-Evaluation 07/03/23    Authorization Type [employer] Medicare    PT Start Time 1443    PT Stop Time 1528    PT Time Calculation (min) 45 min    Activity Tolerance Patient tolerated treatment well    Behavior During Therapy [facility] for tasks assessed/performed             Past Medical History:  Diagnosis Date   Allergy    ANEMIA-NOS 09/25/2007   Anxiety    Arthritis    B12 DEFICIENCY 05/03/2007   Blood transfusion without reported diagnosis    CAD (coronary artery disease) 01/2010   MI, Nishan   CAP (community acquired pneumonia) 03/29/2022   Cardiomyopathy (HCC) 02/08/2010   H/o this 2012 after urosepsis, no recurrence.    Cataract    CHF (congestive heart failure) (HCC)    with episode of sepsis   Complication of anesthesia    Depression    not recently   FIBROMYALGIA 05/03/2007   Fibromyalgia    GERD 02/22/2010   Glaucoma 02/2013   [facility]   Headache    History of CHF (congestive heart failure) 01/2010   History of colon polyps 2004   HYPERLIPIDEMIA 12/19/2007   HYPOTENSION, ORTHOSTATIC 12/06/2008   Interstitial cystitis    [place] now Dr Logan Bores   Lupus (systemic lupus erythematosus) (HCC) 02/08/2010   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/08/2010   OSTEOPOROSIS 08/2009   bisphosphonate on hold 2/2 dysphagia, on reclast done in August each year   Parkinson's disease 08/25/2015   Dx Dr [name] 07/2015    Pneumonia    PONV (postoperative nausea and vomiting)    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   Takotsubo cardiomyopathy 2008   due to E coli urosepsis   Past Surgical History:  Procedure Laterality Date   ABDOMINAL HYSTERECTOMY  1970s   IUD infection - first  partial then with oophorectomy (cysts), complication - low blood pressure   CATARACT EXTRACTION Bilateral    CHOLECYSTECTOMY     complication - low blood pressure   COLONOSCOPY  06/2008   h/o polyps but latest WNL, rec rpt 10 yrs Juanda Chance)   COLONOSCOPY  11/2018   multiple TAs (10 polyps total), rpt 2 yrs Russella Dar)   COLONOSCOPY  04/2021   multiple TAs, rpt 3 yrs Russella Dar)   CYSTOSCOPY  12/2013   abx treatment for recurrent cystitis   DEXA  04/2013   T -2.9 @ femur, -1.6 @ spine   DEXA  04/2017   T -2.9 hip, -0.7 spine   ESOPHAGOGASTRODUODENOSCOPY  12/2017   WNL, regardless esophagus dilated, small HH Russella Dar)   EYE SURGERY     LUMBAR LAMINECTOMY/DECOMPRESSION MICRODISCECTOMY Right 11/27/2019   Right Lumbar Four-Five foraminotomy;  Maeola Harman, MD)   LUMBAR LAMINECTOMY/DECOMPRESSION MICRODISCECTOMY Right 05/16/2022   Procedure: OPEN LUMBAR LAMINECTOMY, RT, L45 W/LATERAL RECESS DECOMPRESSION;  Surgeon: Bethann Goo, DO;  Location: [facility] OR;  Service: Neurosurgery;  Laterality: Right;  3C   MINOR PLACEMENT OF FIDUCIAL N/A 06/30/2021   Procedure: MINOR PLACEMENT OF FIDUCIAL;  Surgeon: Maeola Harman, MD;  Location: [facility] OR;  Service: Neurosurgery;  Laterality: N/A;  Minor room  PTOSIS REPAIR Bilateral 10/2020   Plastic Surgery   PULSE GENERATOR IMPLANT Right 07/14/2021   Procedure: UNILATERAL PULSE GENERATOR IMPLANT;  Surgeon: Maeola Harman, MD;  Location: [facility] OR;  Service: Neurosurgery;  Laterality: Right;   SUBTHALAMIC STIMULATOR INSERTION Bilateral 07/07/2021   Procedure: Deep brain stimulator placement;  Surgeon: Maeola Harman, MD;  Location: [facility] OR;  Service: Neurosurgery;  Laterality: Bilateral;   TUBAL LIGATION     UPPER GASTROINTESTINAL ENDOSCOPY     Patient Active Problem List   Diagnosis Date Noted   Urinary incontinence 01/12/2023   Lumbar radiculopathy, chronic 05/16/2022   Pre-op evaluation 04/26/2022   Localized swelling of chest wall 12/23/2021   Other fatigue 11/25/2021    Lymphadenopathy 11/25/2021   Closed fracture of rib of right side with routine healing 05/28/2021   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   Left shoulder pain 06/19/2020   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   Degenerative lumbar spinal stenosis 11/27/2019   Knee pain, bilateral 08/06/2018   Constipation 08/06/2018   Right lumbar radiculopathy 04/10/2018   Dysphagia 10/09/2017   Chronic cough 07/16/2017   GAD (generalized anxiety disorder) 04/28/2016   Chronic insomnia 03/07/2016   Left Achilles tendinitis 12/08/2015   Parkinson's disease 08/25/2015   Advanced care planning/counseling discussion 05/26/2015   Family circumstance 02/23/2015   Encounter for general adult medical examination with abnormal findings 05/21/2014   MDD (major depressive disorder), single episode, moderate (HCC) 11/12/2013   Dysuria 09/02/2013   DDD (degenerative disc disease), lumbar 05/31/2013   Medicare annual wellness visit, subsequent 05/20/2013   HLD (hyperlipidemia) 05/20/2013   Vitamin D deficiency 05/09/2013   Recurrent UTI 01/03/2011   Rash and other nonspecific skin eruption 05/11/2010   GERD 02/22/2010   CAD (coronary artery disease) 01/25/2010   Osteoporosis 11/10/2009   Orthostatic hypotension 12/06/2008   Anemia 09/25/2007   Vitamin B12 deficiency 05/03/2007   Fibromyalgia 05/03/2007    ONSET DATE: 06/28/2022  REFERRING DIAG: Parkinson's, S/P lumbar surgery  THERAPY DIAG:  Parkinson's disease with dyskinesia, unspecified whether manifestations fluctuate  Other low back pain  Muscle weakness (generalized)  Difficulty in walking, not elsewhere classified  Unsteadiness on feet  Rationale for Evaluation and Treatment Rehabilitation  SUBJECTIVE:                                                                                                                                                                                               SUBJECTIVE STATEMENT: Reports that she is walking about 25 minutes daily, she does report some right buttock  and back pain, pain a 3/10 in this area Pt accompanied by: self  PERTINENT HISTORY: see above, did have lumbar surgery in June, has a deep brain stimulator for movement disorder  PAIN:  Are you having pain? Yes: NPRS scale: 2/10 Pain location: lright rib area Pain description: ache/sore Aggravating factors: bending, lifting,doing too much pain can be 6/10 Relieving factors: rest can be 0/10  PrecAUTIONS: None  WEIGHT BEARING RESTRICTIONS No  FALLS: Has patient fallen in last 6 months? Yes. Number of falls 1  LIVING ENVIRONMENT: Lives with: lives with their family Lives in: House/apartment Stairs: Yes: Internal: 12 steps; can reach both Has following equipment at home: Single point cane and Walker - 4 wheeled  PLOF: Independent, does some housework  PATIENT GOALS:  be stronger, walk longer and better, care for sister when she needs me  OBJECTIVE:   COGNITION: Overall cognitive status: Within functional limits for tasks assessed   SENSATION: WFL   POSTURE: rounded shoulders, forward head, and decreased lumbar lordosis  LOWER EXTREMITY ROM:    Motions are WFL's but she has poor control of the right LE, weakness and dyskinesia  LOWER EXTREMITY MMT:    MMT Right Eval Left Eval  Hip flexion 3+ 4-  Hip extension 4- 4-  Hip abduction 4- 4-  Hip adduction 4- 4-  Hip internal rotation    Hip external rotation    Knee flexion 4- 4  Knee extension 4- 4  Ankle dorsiflexion 4- 4-  Ankle plantarflexion    Ankle inversion    Ankle eversion 3+ 4-  (Blank rows = not tested)  TRANSFERS:  Has to use hands, if not falls back into chair  GAIT: Gait pattern: has extraneous motions of the right arm and the right leg and the shoulders and head, off balance at times but typically corrects on own, right LE tends to circumduct and  has decreased control of the knee and the ankle, decreased left arm swing Distance walked: 330 feet Assistive device utilized: None Level of assistance: Complete Independence Comments: on stairs does one at a time  FUNCTIONAL TESTs:  5 times sit to stand: 21 seconds, has to use hands Timed up and go (TUG): 16 seconds, no device BERG balance 42/56 3 minute walk test 330 feet  TODAY'S TREATMENT:  05/16/23 Bike level 4 x 5 minutes UBE level 3 x 6 minutes Step reach in pbars Cone step to cone [employer] Direction changes Supine feet on ball K2C, rotation, bridge and isometric abs Supine PWR moves  05/02/23 Gait large lap around the back building, no rest did have shuffling a few steps and then could correct but this happened multiple times Nustep level 5 x 5 minutes Seated PWR moves multiple times and then issued for her HEP TUG 13 seconds Standing step and reach Alternate steps 6", then side stp on 6" and turn  04/25/23 Bike L3 x 6 minutes, the 2 x 30 seconds fast pedaling. B side stepping over dowels on floor, 5 x 3 steps in each direction, no unsteadiness, normal speeds Repeated activity on floor mat/soft surface, no difficulty B side step and then alternating tap on cone to engage SLS on floor mat. She had more difficulty balancing on LLE to tap with R. Floor ladder- walking through with one foot in each square, then changing step patterns in all directions. Stable throughout. Standing stepping back with rotation to the side and reach back to wall, turning r, then L. CGA, mild unsteadiness,  but she recovered with CGA  04/18/23 Gait outside big lap around the back building, struggled coming up hill needing HHA Bike level 3 x 5 minutes Volleyball Direction changes Side step on and off airex Nustep level 4 x 6 minutes On airex cone toe touch  04/11/23 UBE level 2 x 6 minutes Nustep level 4 x 6 minutes Gait outside around the back building , fair pace no rest,  some CGA at times due to off balance, had more extraneous motions today Gait with direction changes 15# Leg curls 2x10 Leg extension 5# 2x10 Volleyball Ball kicks Onairex toe touch On airex step on and off side On airex ball toss   PATIENT EDUCATION: Education details: POC Person educated: Patient and Child(ren) Education method: Explanation Education comprehension: verbalized understanding   HOME EXERCISE PROGRAM: Advised to walk 10 - 20 minutes a day    GOALS: Goals reviewed with patient? Yes  SHORT TERM GOALS: Target date: 04/23/23  Independent with initial HEP  Goal status: ongoing 04/11/23   LONG TERM GOALS: Target date: 07/03/23  Independent with advanced HEP Goal status: ongoing 04/18/23  2.  Decrease TUG time to 12 seconds Goal status: met 05/02/23  3.  Increase BERG balance test score to 46/56 Goal status: INITIAL  4.  Increase right LE strength to 4/5 Goal status: progressing 05/02/23  5.  Get up from sitting without using hands Goal status: 04/25/23-met  ASSESSMENT:  CLINICAL IMPRESSION: Patient reports she is walking consistently. I added PWR moves in supine today.  She reported being very stiff in the mornings.  Had some right hip pain, I did do HS and piriformis stretches. Overall she is doing well, had two times today where she needed CGA due to loss of balance during stepping  OBJECTIVE IMPAIRMENTS Abnormal gait, decreased activity tolerance, decreased balance, decreased coordination, decreased endurance, decreased mobility, difficulty walking, decreased ROM, decreased strength, increased muscle spasms, impaired flexibility, improper body mechanics, postural dysfunction, and pain.   REHAB POTENTIAL: Good  CLINICAL DECISION MAKING: Stable/uncomplicated  EVALUATION COMPLEXITY: Low  PLAN: PT FREQUENCY: 1-2x/week  PT DURATION: 12 weeks  PLANNED INTERVENTIONS: Therapeutic exercises, Therapeutic activity, Neuromuscular re-education, Balance training,  Gait training, Patient/Family education, Self Care, Joint mobilization, Stair training, Electrical stimulation, Spinal mobilization, Cryotherapy, Moist heat, and Manual therapy  PLAN FOR NEXT SESSION: will start to see every other week   Oley Balm DPT 05/17/23 2:43 PM

## 2023-05-20 DIAGNOSIS — M797 Fibromyalgia: Secondary | ICD-10-CM | POA: Diagnosis not present

## 2023-05-20 DIAGNOSIS — N390 Urinary tract infection, site not specified: Secondary | ICD-10-CM | POA: Diagnosis not present

## 2023-05-20 DIAGNOSIS — R54 Age-related physical debility: Secondary | ICD-10-CM | POA: Diagnosis not present

## 2023-05-20 DIAGNOSIS — I2510 Atherosclerotic heart disease of native coronary artery without angina pectoris: Secondary | ICD-10-CM | POA: Diagnosis not present

## 2023-05-20 DIAGNOSIS — Z885 Allergy status to narcotic agent status: Secondary | ICD-10-CM | POA: Diagnosis not present

## 2023-05-20 DIAGNOSIS — J9811 Atelectasis: Secondary | ICD-10-CM | POA: Diagnosis not present

## 2023-05-20 DIAGNOSIS — I499 Cardiac arrhythmia, unspecified: Secondary | ICD-10-CM | POA: Diagnosis not present

## 2023-05-20 DIAGNOSIS — Z743 Need for continuous supervision: Secondary | ICD-10-CM | POA: Diagnosis not present

## 2023-05-20 DIAGNOSIS — G9050 Complex regional pain syndrome I, unspecified: Secondary | ICD-10-CM | POA: Diagnosis not present

## 2023-05-20 DIAGNOSIS — Z881 Allergy status to other antibiotic agents status: Secondary | ICD-10-CM | POA: Diagnosis not present

## 2023-05-20 DIAGNOSIS — Z882 Allergy status to sulfonamides status: Secondary | ICD-10-CM | POA: Diagnosis not present

## 2023-05-20 DIAGNOSIS — H539 Unspecified visual disturbance: Secondary | ICD-10-CM

## 2023-05-20 DIAGNOSIS — R531 Weakness: Secondary | ICD-10-CM | POA: Diagnosis not present

## 2023-05-20 DIAGNOSIS — M810 Age-related osteoporosis without current pathological fracture: Secondary | ICD-10-CM | POA: Diagnosis not present

## 2023-05-20 DIAGNOSIS — M5030 Other cervical disc degeneration, unspecified cervical region: Secondary | ICD-10-CM | POA: Diagnosis not present

## 2023-05-20 DIAGNOSIS — Z888 Allergy status to other drugs, medicaments and biological substances status: Secondary | ICD-10-CM | POA: Diagnosis not present

## 2023-05-20 DIAGNOSIS — Z9682 Presence of neurostimulator: Secondary | ICD-10-CM | POA: Diagnosis not present

## 2023-05-20 DIAGNOSIS — I10 Essential (primary) hypertension: Secondary | ICD-10-CM

## 2023-05-20 DIAGNOSIS — R29810 Facial weakness: Secondary | ICD-10-CM | POA: Diagnosis not present

## 2023-05-20 DIAGNOSIS — G20A1 Parkinson's disease without dyskinesia, without mention of fluctuations: Secondary | ICD-10-CM | POA: Diagnosis not present

## 2023-05-20 DIAGNOSIS — G4489 Other headache syndrome: Secondary | ICD-10-CM | POA: Diagnosis not present

## 2023-05-20 DIAGNOSIS — R404 Transient alteration of awareness: Secondary | ICD-10-CM | POA: Diagnosis not present

## 2023-05-20 DIAGNOSIS — R55 Syncope and collapse: Secondary | ICD-10-CM | POA: Diagnosis not present

## 2023-05-20 DIAGNOSIS — R6889 Other general symptoms and signs: Secondary | ICD-10-CM | POA: Diagnosis not present

## 2023-05-20 DIAGNOSIS — E538 Deficiency of other specified B group vitamins: Secondary | ICD-10-CM | POA: Diagnosis not present

## 2023-05-20 DIAGNOSIS — E785 Hyperlipidemia, unspecified: Secondary | ICD-10-CM | POA: Diagnosis not present

## 2023-05-20 DIAGNOSIS — E860 Dehydration: Secondary | ICD-10-CM | POA: Diagnosis not present

## 2023-05-20 DIAGNOSIS — M21379 Foot drop, unspecified foot: Secondary | ICD-10-CM | POA: Diagnosis not present

## 2023-05-20 DIAGNOSIS — D849 Immunodeficiency, unspecified: Secondary | ICD-10-CM | POA: Diagnosis not present

## 2023-05-20 DIAGNOSIS — G459 Transient cerebral ischemic attack, unspecified: Secondary | ICD-10-CM | POA: Diagnosis not present

## 2023-05-20 HISTORY — DX: Unspecified visual disturbance: H53.9

## 2023-05-20 HISTORY — DX: Essential (primary) hypertension: I10

## 2023-05-21 DIAGNOSIS — N390 Urinary tract infection, site not specified: Secondary | ICD-10-CM | POA: Diagnosis not present

## 2023-05-21 DIAGNOSIS — I080 Rheumatic disorders of both mitral and aortic valves: Secondary | ICD-10-CM | POA: Diagnosis not present

## 2023-05-21 DIAGNOSIS — R55 Syncope and collapse: Secondary | ICD-10-CM | POA: Diagnosis not present

## 2023-05-21 DIAGNOSIS — G9050 Complex regional pain syndrome I, unspecified: Secondary | ICD-10-CM | POA: Diagnosis not present

## 2023-05-21 DIAGNOSIS — H539 Unspecified visual disturbance: Secondary | ICD-10-CM | POA: Diagnosis not present

## 2023-05-21 DIAGNOSIS — G4489 Other headache syndrome: Secondary | ICD-10-CM | POA: Diagnosis not present

## 2023-05-24 ENCOUNTER — Telehealth: Payer: Self-pay | Admitting: *Deleted

## 2023-05-24 NOTE — Transitions of Care (Post Inpatient/ED Visit) (Signed)
05/24/2023  Name: [name] MRN: [id number] DOB: [birth year]  Today's TOC FU Call Status: Today's TOC FU Call Status:: Successful TOC FU Call Competed TOC FU Call Complete Date: 05/24/23  Transition Care Management Follow-up Telephone Call Date of Discharge: 05/21/23 Discharge Facility: Other (Non-[facility] Facility) Name of Other (Non[facility]) Discharge Facility: [facility] Type of Discharge: Inpatient Admission Primary Inpatient Discharge Diagnosis:: syncope How have you been since you were released from the hospital?: Better Any questions or concerns?: Yes Patient Questions/Concerns:: Patient stated she has a rash where the heart monitor pads where. She is using benadryl cream on it. Patient Questions/Concerns Addressed: Other:  Items Reviewed: Did you receive and understand the discharge instructions provided?: Yes Medications obtained,verified, and reconciled?: Yes (Medications Reviewed) Any new allergies since your discharge?: Yes (the adhesive from the telemetry pads) Dietary orders reviewed?: No Do you have support at home?: Yes People in Home: child(ren), adult Name of Support/Comfort Primary Source: robert  Medications Reviewed Today: Medications Reviewed Today     Reviewed by [name], RN (Case Manager) on 05/24/23 at 1512  Med List Status: <None>   Medication Order Taking? Sig Documenting Provider Last Dose Status Informant  acetaminophen (TYLENOL) 650 MG CR tablet [id number] Yes Take 2 tablets (1,300 mg total) by mouth every 8 (eight) hours as needed for pain. Lewie Chamber, MD Taking Active Self  AMBULATORY Clent Demark MEDICATION [id number] Yes Lift chair Dx:  Olive Bass, DO Taking Active Self  Calcium Carbonate-Vitamin D (CALCIUM 600/VITAMIN D) 600-400 MG-UNIT chew tablet [id number] Yes Chew 1 tablet by mouth daily. Eustaquio Boyden, MD Taking Active Self           Med Note [name], [name]   Thu Jun 23, 2021  3:53 PM)    carbidopa-levodopa  (SINEMET IR) 25-100 MG tablet [id number] Yes 1 po tid prn  Patient taking differently: Take 2 tablets by mouth in the morning and at bedtime.   Vladimir Faster, DO Taking Active Self  cephALEXin (KEFLEX) 500 MG capsule [id number] Yes Take 1 capsule (500 mg total) by mouth 3 (three) times daily. Karie Schwalbe, MD Taking Active   Cholecalciferol (VITAMIN D3) 25 MCG (1000 UT) CAPS [id number] Yes Take 1 capsule (1,000 Units total) by mouth daily. Eustaquio Boyden, MD Taking Active Self           Med Note [name], [name]   Thu Jun 23, 2021  3:54 PM)    clonazePAM (KLONOPIN) 0.5 MG tablet [id number] Yes Take 0.5 tablets (0.25 mg total) by mouth every morning AND 1 tablet (0.5 mg total) at bedtime. Eustaquio Boyden, MD Taking Active   clotrimazole-betamethasone Thurmond Butts) cream [id number] Yes Apply 1 application topically daily. Eustaquio Boyden, MD Taking Active Self  cyanocobalamin (VITAMIN B12) 1000 MCG/ML injection [id number] Yes INJECT 1 ML (1,000 MCG TOTAL) INTO THE MUSCLE EVERY 30 DAYS. Eustaquio Boyden, MD Taking Active   denosumab [facility]) 60 MG/ML SOSY injection [id number] Yes Inject 60 mg into the skin every 6 (six) months. [provider] Taking Active Self           Med Note DEISI, SALONGA   Tue May 02, 2022 11:45 AM)    fludrocortisone (FLORINEF) 0.1 MG tablet [id number] Yes TAKE 1 TABLET BY MOUTH EVERY DAY Eustaquio Boyden, MD Taking Active   Glycerin-Hypromellose-PEG 400 (ARTIFICIAL TEARS) 0.2-0.2-1 % SOLN [id number] Yes Place 1 drop into both eyes daily as needed (dry eyes). [provider] Taking Active Self  latanoprost (XALATAN) 0.005 %  ophthalmic solution [id number] Yes Place 1 drop into both eyes at bedtime. [provider] Taking Active Self  mirabegron ER (MYRBETRIQ) 25 MG TB24 tablet [id number] Yes Take 1 tablet (25 mg total) by mouth daily. Eustaquio Boyden, MD Taking Active   nortriptyline (PAMELOR) 10 MG capsule [id number] Yes TAKE 1 CAPSULE BY  MOUTH AT BEDTIME. Vladimir Faster, DO Taking Active   sertraline (ZOLOFT) 100 MG tablet [id number] Yes Take 1 tablet (100 mg total) by mouth daily. Eustaquio Boyden, MD Taking Active   SYRINGE-NEEDLE, DISP, 3 ML (BD SAFETYGLIDE SYRINGE/NEEDLE) 25G X 1" 3 ML MISC [id number] Yes Use to inject vitamin B12 monthly. Eustaquio Boyden, MD Taking Active Self  traMADol Janean Sark) 50 MG tablet [id number] Yes Take 1 tablet (50 mg total) by mouth every 6 (six) hours as needed for moderate pain. Eustaquio Boyden, MD Taking Active             Home Care and Equipment/Supplies: Were Home Health Services Ordered?: NA Any new equipment or medical supplies ordered?: NA  Functional Questionnaire: Do you need assistance with bathing/showering or dressing?: No Do you need assistance with meal preparation?: No Do you need assistance with eating?: No Do you have difficulty maintaining continence: No Do you need assistance with getting out of bed/getting out of a chair/moving?: No Do you have difficulty managing or taking your medications?: No  Follow up appointments reviewed: PCP Follow-up appointment confirmed?: Yes Date of PCP follow-up appointment?: 05/25/23 Follow-up Provider: Dr Sharen Hones [facility] Follow-up appointment confirmed?: NA Do you need transportation to your follow-up appointment?: No Do you understand care options if your condition(s) worsen?: Yes-patient verbalized understanding  SDOH Interventions Today    Flowsheet Row Most Recent Value  SDOH Interventions   Food Insecurity Interventions Intervention Not Indicated  Housing Interventions Intervention Not Indicated  Transportation Interventions Intervention Not Indicated, Patient Resources (Friends/Family)  [lives with son and he takes her to her appointments]      Interventions Today    Flowsheet Row Most Recent Value  General Interventions   General Interventions Discussed/Reviewed General Interventions Discussed,  General Interventions Reviewed, Doctor Visits  Pharmacy Interventions   Pharmacy Dicussed/Reviewed Pharmacy Topics Discussed      TOC Interventions Today    Flowsheet Row Most Recent Value  TOC Interventions   TOC Interventions Discussed/Reviewed TOC Interventions Discussed, TOC Interventions Reviewed  [RN discussed rash that she has from th adhesive telemetry pads.]        Gean Maidens BSN RN [facility] [phone]

## 2023-05-25 ENCOUNTER — Ambulatory Visit: Payer: Medicare Other | Attending: Family Medicine

## 2023-05-25 ENCOUNTER — Encounter: Payer: Self-pay | Admitting: Family Medicine

## 2023-05-25 ENCOUNTER — Other Ambulatory Visit: Payer: Self-pay | Admitting: Family Medicine

## 2023-05-25 ENCOUNTER — Ambulatory Visit (INDEPENDENT_AMBULATORY_CARE_PROVIDER_SITE_OTHER): Payer: Medicare Other | Admitting: Family Medicine

## 2023-05-25 VITALS — BP 132/76 | HR 77 | Temp 97.5°F | Ht 66.0 in | Wt 155.5 lb

## 2023-05-25 DIAGNOSIS — R55 Syncope and collapse: Secondary | ICD-10-CM | POA: Diagnosis not present

## 2023-05-25 DIAGNOSIS — F321 Major depressive disorder, single episode, moderate: Secondary | ICD-10-CM

## 2023-05-25 DIAGNOSIS — R21 Rash and other nonspecific skin eruption: Secondary | ICD-10-CM | POA: Diagnosis not present

## 2023-05-25 DIAGNOSIS — N390 Urinary tract infection, site not specified: Secondary | ICD-10-CM

## 2023-05-25 DIAGNOSIS — G20B1 Parkinson's disease with dyskinesia, without mention of fluctuations: Secondary | ICD-10-CM

## 2023-05-25 MED ORDER — NYSTATIN 100000 UNIT/GM EX CREA: 1.0000 | TOPICAL_CREAM | Freq: Two times a day (BID) | CUTANEOUS | 0 refills | Status: DC

## 2023-05-25 MED ORDER — ACETAMINOPHEN 500 MG PO TABS: 1000.0000 mg | ORAL_TABLET | Freq: Three times a day (TID) | ORAL | Status: AC

## 2023-05-25 MED ORDER — CRANBERRY-VITAMIN C 250-60 MG PO CAPS: 2.0000 | ORAL_CAPSULE | Freq: Every day | ORAL | Status: AC

## 2023-05-25 NOTE — Progress Notes (Signed)
Ph: [phone] Fax: [phone]   Patient ID: [name], female    DOB: 01-Apr-1946, 77 y.o.   MRN: [id number]  This visit was conducted in person.  BP 132/76   Pulse 77   Temp (!) 97.5 F (36.4 C) (Temporal)   Ht 5\' 6"  (1.676 m)   Wt 155 lb 8 oz (70.5 kg)   SpO2 98%   BMI 25.10 kg/m   Orthostatic VS for the past 72 hrs (Last 3 readings):  Orthostatic BP Patient Position  05/25/23 1233 128/84 Standing  05/25/23 1230 142/86 Supine   CC: hosp f/u visit  Subjective:   HPI: [name] is a 77 y.o. female presenting on 05/25/2023 for Hospitalization Follow-up (Seen on 05/20/23 at [facility] ED, dx syncope and collapse; visual changes; HA syndrome, UTI w/o hematuria. C/o rash on R arm and chest where electrodes were attached at hospital. Pt accompanied by son, Trey Paula.)   Recent hospitalization at [facility] in [place] for syncope during church service. She was told they lost her pulse for several seconds, no CPR needed however. She passed out while seated in pew.  Hospital records reviewed. Med rec performed.  Head CT without acute finding as well as EKG and EEG. DBS was functioning normally.  No noted events on telemetry.  Also had reassuring echocardiogram and carotid US.  Treated with IVF.  Thought to have UTI due to abnormal UA, treated with ceftriaxone in ER followed by 5d keflex course. Umicro normal, UCx was not sent off.  H/o recurrent UTIs on cranberry tablets daily.   No chest pain, recent dizziness, palpitations or dyspnea.  She has chronic headache, managed with tylenol.  She did not have UTI symptoms during recent events.   UTI history: 12/2022 - pansensitive E coli treated with kefiex 7d course 01/2023 - pansensitive E coli treated with keflex 3d course  04/2023 - abnormal UA/micro at [facility] treated with keflex 5d course, UCx not sent  New rash to areas where she had EKG leads as well as IV sites to arms. No significant improvement with OTC  cortizone-10.   She is not taking nortriptyline (bad side effect) or Myrbetriq (may have caused urinary retention).   Last saw [facility] 2018 Vernie Ammons)  Home health not set up.  Other follow up appointments scheduled: neurology Dr [name] 05/30/2023.  ______________________________________________________________________ Hospital admission: 05/20/2023 Hospital discharge: 05/21/2023 TCM f/u phone call: performed 05/24/2023  Active Hospital Problems  Diagnosis    Date Noted  POA  *Syncope and collapse  05/20/2023 Yes  Headache syndrome   05/20/2023  Yes  Visual changes   05/20/2023  Yes  Coronary artery disease  05/20/2023 Yes  Hyperlipidemia   05/20/2023  Yes  Hypertension    05/20/2023  Yes  Parkinsons (*)   05/20/2023  Yes  RSD (reflex sympathetic dystrophy) 05/20/2023 Yes      Relevant past medical, surgical, family and social history reviewed and updated as indicated. Interim medical history since our last visit reviewed. Allergies and medications reviewed and updated. Outpatient Medications Prior to Visit  Medication Sig Dispense Refill   AMBULATORY NON FORMULARY MEDICATION Lift chair Dx:  G20 1 Device 0   Calcium Carbonate-Vitamin D (CALCIUM 600/VITAMIN D) 600-400 MG-UNIT chew tablet Chew 1 tablet by mouth daily.     carbidopa-levodopa (SINEMET IR) 25-100 MG tablet 1 po tid prn (Patient taking differently: Take 2 tablets by mouth in the morning and at bedtime.) 270 tablet 1   cephALEXin (KEFLEX) 500  MG capsule Take 1 capsule (500 mg total) by mouth 3 (three) times daily. 21 capsule 0   Cholecalciferol (VITAMIN D3) 25 MCG (1000 UT) CAPS Take 1 capsule (1,000 Units total) by mouth daily. 30 capsule    clonazePAM (KLONOPIN) 0.5 MG tablet Take 0.5 tablets (0.25 mg total) by mouth every morning AND 1 tablet (0.5 mg total) at bedtime. 45 tablet 0   clotrimazole-betamethasone (LOTRISONE) cream Apply 1 application topically daily. 30 g 0   cyanocobalamin (VITAMIN B12) 1000 MCG/ML  injection INJECT 1 ML (1,000 MCG TOTAL) INTO THE MUSCLE EVERY 30 DAYS. 3 mL 2   denosumab (PROLIA) 60 MG/ML SOSY injection Inject 60 mg into the skin every 6 (six) months.     fludrocortisone (FLORINEF) 0.1 MG tablet TAKE 1 TABLET BY MOUTH EVERY DAY 30 tablet 6   Glycerin-Hypromellose-PEG 400 (ARTIFICIAL TEARS) 0.2-0.2-1 % SOLN Place 1 drop into both eyes daily as needed (dry eyes).     latanoprost (XALATAN) 0.005 % ophthalmic solution Place 1 drop into both eyes at bedtime.     sertraline (ZOLOFT) 100 MG tablet Take 1 tablet (100 mg total) by mouth daily. 100 tablet 3   SYRINGE-NEEDLE, DISP, 3 ML (BD SAFETYGLIDE SYRINGE/NEEDLE) 25G X 1" 3 ML MISC Use to inject vitamin B12 monthly. 50 each 0   traMADol (ULTRAM) 50 MG tablet Take 1 tablet (50 mg total) by mouth every 6 (six) hours as needed for moderate pain. 30 tablet 3   acetaminophen (TYLENOL) 650 MG CR tablet Take 2 tablets (1,300 mg total) by mouth every 8 (eight) hours as needed for pain.     mirabegron ER (MYRBETRIQ) 25 MG TB24 tablet Take 1 tablet (25 mg total) by mouth daily. 30 tablet 11   nortriptyline (PAMELOR) 10 MG capsule TAKE 1 CAPSULE BY MOUTH AT BEDTIME. 90 capsule 0   No facility-administered medications prior to visit.     Per HPI unless specifically indicated in ROS section below Review of Systems  Objective:  BP 132/76   Pulse 77   Temp (!) 97.5 F (36.4 C) (Temporal)   Ht 5\' 6"  (1.676 m)   Wt 155 lb 8 oz (70.5 kg)   SpO2 98%   BMI 25.10 kg/m   Wt Readings from Last 3 Encounters:  05/25/23 155 lb 8 oz (70.5 kg)  03/16/23 156 lb (70.8 kg)  02/20/23 157 lb (71.2 kg)      Physical Exam Vitals and nursing note reviewed.  Constitutional:      Appearance: Normal appearance. She is not ill-appearing.     Comments: Able to get on exam table with minimal assistance  HENT:     Head: Normocephalic and atraumatic.     Mouth/Throat:     Mouth: Mucous membranes are moist.     Pharynx: Oropharynx is clear. No  oropharyngeal exudate or posterior oropharyngeal erythema.  Eyes:     Extraocular Movements: Extraocular movements intact.     Conjunctiva/sclera: Conjunctivae normal.     Pupils: Pupils are equal, round, and reactive to light.  Cardiovascular:     Rate and Rhythm: Normal rate and regular rhythm.     Pulses: Normal pulses.     Heart sounds: Normal heart sounds. No murmur heard. Pulmonary:     Effort: Pulmonary effort is normal. No respiratory distress.     Breath sounds: Normal breath sounds. No wheezing, rhonchi or rales.  Musculoskeletal:     Cervical back: Normal range of motion and neck supple.  Right lower leg: No edema.     Left lower leg: No edema.  Skin:    General: Skin is warm and dry.     Findings: No rash.  Neurological:     Mental Status: She is alert.  Psychiatric:        Mood and Affect: Mood normal.        Behavior: Behavior normal.       Echocardiogram 05/21/2023  LVEF 55-60%, normal wall motion, impaired relaxation pattern consistent with age related change, mild MR, tr TR, mild aortic sclerosis without stenosis, mild-mod AR, fair quality   US Carotid Bilateral 05/21/2023 IMPRESSION:  1. No hemodynamically significant stenosis on either side.  2. Both vertebral arteries are patent with antegrade flow.   XR Chest Ap Portable 05/20/2023 IMPRESSION:  No acute infiltrate.  Mild bibasilar atelectasis.   CT Code Stroke Head WO Contrast 05/20/2023 IMPRESSION:  1. Slightly limited exam secondary to artifact from deep brain stimulators.  2. Small area of hypoattenuation within the midbrain is nonspecific but could represent a small age-indeterminate infarct.  3. No definite acute intracranial hemorrhage.    Assessment & Plan:   Problem List Items Addressed This Visit     Parkinson's disease (Chronic)    Upcoming neurology appt.       Rash and other nonspecific skin eruption    Itchy erythematous maculopapular rash to IV site R forearm as well as between  breasts anterior chest at site of recent hospital lead placement. Not improving with OTC Cortizone-10. Rx nystatin for possible candidal component.       Recurrent UTI    Recent presumed UTI treatment with IM Rocephin + Keflex oral.  She continues daily cranberry tablet preventatively. Last saw urology Vernie Ammons) 2018 Has not been on topical estrogen      Relevant Medications   nystatin cream (MYCOSTATIN)   Syncope - Primary    Overall reassuring ER evaluation with echocardiogram, carotid US, EEG, EKG, head CT, labwork.  ?UTI related however didn't have urinary symptoms at the time. UCx was not sent at ER.  ?neurogenic in parkinson's with autonomic dysfunction however she's continued florinef and and was seated when this happened, not orthostatic in office today.  Reasonable to further evaluate for cardiogenic syncope with holter monitor and cardiology referral. Pt agrees with plan.       Relevant Orders   LONG TERM MONITOR (3-14 DAYS)   Ambulatory referral to Cardiology     Meds ordered this encounter  Medications   acetaminophen (TYLENOL) 500 MG tablet    Sig: Take 2 tablets (1,000 mg total) by mouth in the morning, at noon, and at bedtime.   Cranberry-Vitamin C (AZO CRANBERRY URINARY TRACT) 250-60 MG CAPS    Sig: Take 2 capsules by mouth daily.   nystatin cream (MYCOSTATIN)    Sig: Apply 1 Application topically 2 (two) times daily. To affected area/rash    Dispense:  30 g    Refill:  0    Orders Placed This Encounter  Procedures   Ambulatory referral to Cardiology    Referral Priority:   Routine    Referral Type:   Consultation    Referral Reason:   Specialty Services Required    Number of Visits Requested:   1   LONG TERM MONITOR (3-14 DAYS)    Standing Status:   Future    Number of Occurrences:   1    Standing Expiration Date:   05/24/2024    Order Specific Question:  Where should this test be performed?    Answer:   [facility]    Order Specific Question:    Does the patient have an implanted cardiac device?    Answer:   No    Order Specific Question:   Prescribed days of wear    Answer:   7    Order Specific Question:   Type of enrollment    Answer:   Home Enrollment    Order Specific Question:   Vendor:    Answer:   [employer]    Patient Instructions  Try nystatin cream to itchy rash.  Good to see you today I will order holter monitor to wear for a week to evaluate for abnormal heart rhythm contributing to recent passing out episode.  Let me know if any recurrent passing out episodes for cardiology evaluation.   Follow up plan: No follow-ups on file.  Eustaquio Boyden, MD

## 2023-05-25 NOTE — Assessment & Plan Note (Signed)
Upcoming neurology appt  

## 2023-05-25 NOTE — Assessment & Plan Note (Addendum)
Recent presumed UTI treatment with IM Rocephin + Keflex oral.  She continues daily cranberry tablet preventatively. Last saw urology Vernie Ammons) 2018 Has not been on topical estrogen

## 2023-05-25 NOTE — Telephone Encounter (Signed)
Name of Medication: Clonazepam Name of Pharmacy: [facility] [address] Last Lenox Ahr or Written Date and Quantity: 04/11/23, #45 Last Office Visit and Type: 05/25/23, ER f/u Next Office Visit and Type: 07/11/23, 6 mo f/u Last Controlled Substance Agreement Date: 04/28/16 Last UDS: 04/28/16

## 2023-05-25 NOTE — Assessment & Plan Note (Signed)
Itchy erythematous maculopapular rash to IV site R forearm as well as between breasts anterior chest at site of recent hospital lead placement. Not improving with OTC Cortizone-10. Rx nystatin for possible candidal component.

## 2023-05-25 NOTE — Patient Instructions (Addendum)
Try nystatin cream to itchy rash.  Good to see you today I will order holter monitor to wear for a week to evaluate for abnormal heart rhythm contributing to recent passing out episode.  Let me know if any recurrent passing out episodes for cardiology evaluation.

## 2023-05-25 NOTE — Assessment & Plan Note (Addendum)
Overall reassuring ER evaluation with echocardiogram, carotid US, EEG, EKG, head CT, labwork.  ?UTI related however didn't have urinary symptoms at the time. UCx was not sent at ER.  ?neurogenic in parkinson's with autonomic dysfunction however she's continued florinef and and was seated when this happened, not orthostatic in office today.  Reasonable to further evaluate for cardiogenic syncope with holter monitor and cardiology referral. Pt agrees with plan.

## 2023-05-27 ENCOUNTER — Encounter: Payer: Self-pay | Admitting: Family Medicine

## 2023-05-28 ENCOUNTER — Ambulatory Visit: Payer: Medicare Other | Admitting: Physical Therapy

## 2023-05-28 DIAGNOSIS — R55 Syncope and collapse: Secondary | ICD-10-CM

## 2023-05-28 NOTE — Progress Notes (Signed)
Assessment/Plan:    1.  Parkinsons Disease             -Patient completed neurocognitive testing on January 25, 2021 with Dr. Roseanne Reno.  Evidence of MCI only.  -Patient is status post bilateral STN DBS on July 07, 2021 with IPG placement on July 14, 2021.    -Continue carbidopa/levodopa 25/100, 1 tab twice per day but if has too much dyskinesia with 2nd dose, she is to take 1 in the AM, 0.5 in the middle of the day, 0.5 mg evening  -some discomfort when hits scalp wire at night.  She did not tolerate gabapentin or Lyrica.  She is having trouble sleeping because of this.  She states that clonazepam use to help her sleep and she is still on it but it is not helping enough.  We decided to try very low-dose Pamelor.  She was on amitriptyline in the past and while she reports that as an allergy, the allergy was just that she had some morning sedation/hangover effect.  We discussed the risks/benefits/side effects of amitriptyline, and particularly its interaction with her other medications and potential for long QT/serotonin syndrome.  We decided to start with a very low-dose of 10 mg at bedtime.  -She needs to get back to physical therapy.  She cannot get this week because her son is out of town and states that next week will be difficult because her sister is having heart surgery.  I did send the referral, but she does know that she will need to wait a few weeks. 2.  Dyskinesia             -mostly resolved 3.  Neurogenic Orthostatic Hypotension             -Did well on Northera.  Patient stopped that on her own.  Now on Florinef 0.1 mg daily 4.  Depression             -On sertraline.  Doing well. 5.  b12 deficiency             -on injections. 6.  Urinary incontinence             -on myrbetriq.  She is doing well in that regard 7.  Lumbar radiculopathy             -She is status post lumbar foraminotomy with Dr. Venetia Maxon on November 27, 2019.  Pain has reemerged and she underwent L4-L5 laminectomy  with Dr. Jake Samples on May 16, 2022. 8.  Depression  -On sertraline, 100 mg daily. 9.  Dysphagia  -MBE done in June, 2023 with mild oropharyngeal dysphagia.  Regular diet with thin liquids recommended. 10.  Syncopal episode  -Occurred while at church in June, 2024  -Patient was seated at the time of the episode.  -Had EEG at [facility] and was reported to be normal.  -EKG unremarkable.  -ER workup revealed UTI and dehydration.  -Patient awaiting further cardiology workup.  Subjective:   [name] was seen today in follow up for levodopa challenge.  My previous records were reviewed prior to todays visit as well as outside records available to me.  Patient has been complaining about headache, which really has to do with pain where her head touches the wire, especially when she dies down at night.  She has tried Lyrica, gabapentin, nortriptyline all with side effects.  We told her to go ahead and try extra strength Tylenol 1000 mg 3 times per day  to see how she did with this, since she was so sensitive to medicine.  She reports today that ***.  Separately from this, she has been attending physical therapy.  Physical therapist noted some dragging of the leg, and notes indicate it was a combination of dyskinesia, Parkinson's disease itself and potential issues from her prior back surgery.  Patient wanted to know if programming changes could help and she moved up her appointment to today to discuss.  In addition to this, the patient apparently had a recent syncopal episode.  This occurred during church on June 23.  Records from hospital at [facility] indicate that patient told her son as they were walking out of church that she had passed out.  EMS was called and then brought to the hospital she apparently they became unresponsive for a few seconds.  Orthostatic vitals were apparently negative in the emergency room.  CT brain, EEG and EKG were reported to be unremarkable.  Carotid ultrasound was unremarkable.   They felt etiology was likely due to UTI and dehydration and patient was started on antibiotics. Current prescribed movement disorder medications: Carbidopa/levodopa 25/100, 1 tablet twice per day (6am/3pm) Lyrica 50 mg nightly   PREVIOUS MEDICATIONS: northera (worked well but pt d/c when they quit sending b/c she owed $); amantadine (discontinued because of hallucinations); pramipexole (decreased in past because of hallucinations/dyskinesia); clonazepam (stopped it because of fatigue, but also because she just did not need it any longer); lyrica; pamelor (n/v)  ALLERGIES:   Allergies  Allergen Reactions   Iohexol Hives and Shortness Of Breath     Code: HIVES, Desc: PT developed 2 hives, followed by SOB, severe headache post 87cc's Omnipaque 300., Onset Date: [id number]    Amitriptyline Other (See Comments)    Sedated next morning   Ciprofloxacin Nausea And Vomiting   Cymbalta [Duloxetine Hcl] Other (See Comments)    Worsened depression   Lyrica [Pregabalin] Other (See Comments)    Tried during hospitalization - unsure effects but unable to tolerate   Imipramine Hcl Rash   Iodine Rash   Lidocaine Hcl Rash   Morphine Sulfate Rash   Neosporin [Neomycin-Bacitracin Zn-Polymyx] Rash and Other (See Comments)    Worsened skin breaking out   Sulfamethoxazole Rash   Tetracyclines & Related Rash    CURRENT MEDICATIONS:  Outpatient Encounter Medications as of 05/30/2023  Medication Sig   acetaminophen (TYLENOL) 500 MG tablet Take 2 tablets (1,000 mg total) by mouth in the morning, at noon, and at bedtime.   AMBULATORY NON FORMULARY MEDICATION Lift chair Dx:  G20   Calcium Carbonate-Vitamin D (CALCIUM 600/VITAMIN D) 600-400 MG-UNIT chew tablet Chew 1 tablet by mouth daily.   carbidopa-levodopa (SINEMET IR) 25-100 MG tablet 1 po tid prn (Patient taking differently: Take 2 tablets by mouth in the morning and at bedtime.)   cephALEXin (KEFLEX) 500 MG capsule Take 1 capsule (500 mg total) by mouth  3 (three) times daily.   Cholecalciferol (VITAMIN D3) 25 MCG (1000 UT) CAPS Take 1 capsule (1,000 Units total) by mouth daily.   clonazePAM (KLONOPIN) 0.5 MG tablet TAKE 1/2TABS (0.25 MG TOTAL) BY MOUTH EVERY MORNING AND 1 TAB (0.5 MG TOTAL) AT BEDTIME.   clotrimazole-betamethasone (LOTRISONE) cream Apply 1 application topically daily.   Cranberry-Vitamin C (AZO CRANBERRY URINARY TRACT) 250-60 MG CAPS Take 2 capsules by mouth daily.   cyanocobalamin (VITAMIN B12) 1000 MCG/ML injection INJECT 1 ML (1,000 MCG TOTAL) INTO THE MUSCLE EVERY 30 DAYS.   denosumab (PROLIA) 60 MG/ML  SOSY injection Inject 60 mg into the skin every 6 (six) months.   fludrocortisone (FLORINEF) 0.1 MG tablet TAKE 1 TABLET BY MOUTH EVERY DAY   Glycerin-Hypromellose-PEG 400 (ARTIFICIAL TEARS) 0.2-0.2-1 % SOLN Place 1 drop into both eyes daily as needed (dry eyes).   latanoprost (XALATAN) 0.005 % ophthalmic solution Place 1 drop into both eyes at bedtime.   nystatin cream (MYCOSTATIN) Apply 1 Application topically 2 (two) times daily. To affected area/rash   sertraline (ZOLOFT) 100 MG tablet Take 1 tablet (100 mg total) by mouth daily.   SYRINGE-NEEDLE, DISP, 3 ML (BD SAFETYGLIDE SYRINGE/NEEDLE) 25G X 1" 3 ML MISC Use to inject vitamin B12 monthly.   traMADol (ULTRAM) 50 MG tablet Take 1 tablet (50 mg total) by mouth every 6 (six) hours as needed for moderate pain.   No facility-administered encounter medications on file as of 05/30/2023.    Objective:   PHYSICAL EXAMINATION:    VITALS:   There were no vitals filed for this visit.      GEN:  The patient appears stated age and is in NAD. HEENT:  Normocephalic, atraumatic.  The mucous membranes are moist. The superficial temporal arteries are without ropiness or tenderness. CV:  RRR Lungs:  CTAB Neck/HEME:  There are no carotid bruits bilaterally. Skin: Battery site looks good  Neurological examination:  Orientation: The patient is alert and oriented x3. Cranial  nerves: There is chronic L facial droop.  Smile is symmetric. The speech is fluent and hypophonic and occasionally dysarthric.  This is similar to last visit.  Soft palate rises symmetrically and there is no tongue deviation. Hearing is intact to conversational tone. Sensation: Sensation is intact to light touch throughout Motor: Strength is at least antigravity x4.  Movement examination: Tone: There is normal tone in the upper and lower extremities before and after programming Abnormal movements: no tremor today.   Coordination:  There is mild decremation on the R and with toe taps bilaterally Gait and Station: She does have start hesitation.  She is ambulating well in the hall.      Chemistry      Component Value Date/Time   NA 140 01/03/2023 0846   K 3.5 01/03/2023 0846   CL 103 01/03/2023 0846   CO2 29 01/03/2023 0846   BUN 15 01/03/2023 0846   CREATININE 0.68 01/03/2023 0846   CREATININE 0.87 10/08/2019 0921      Component Value Date/Time   CALCIUM 9.1 01/03/2023 0846   ALKPHOS 62 01/03/2023 0846   AST 13 01/03/2023 0846   ALT 9 01/03/2023 0846   BILITOT 0.4 01/03/2023 0846       Lab Results  Component Value Date   WBC 4.9 01/03/2023   HGB 12.2 01/03/2023   HCT 37.8 01/03/2023   MCV 87.0 01/03/2023   PLT 262.0 01/03/2023    Lab Results  Component Value Date   TSH 0.49 10/04/2018    Total time spent on today's visit was *** minutes, including both face-to-face time and nonface-to-face time.  Time included that spent on review of records (prior notes available to me/labs/imaging if pertinent), discussing treatment and goals, answering patient's questions and coordinating care.  This did not include DBS time.     Cc:  Eustaquio Boyden, MD

## 2023-05-28 NOTE — Telephone Encounter (Signed)
ERx 

## 2023-05-30 ENCOUNTER — Inpatient Hospital Stay (HOSPITAL_COMMUNITY)
Admission: EM | Admit: 2023-05-30 | Discharge: 2023-06-02 | DRG: 057 | Disposition: A | Discharge status: Home / Self Care | Payer: Medicare Other | Attending: Student | Admitting: Student

## 2023-05-30 ENCOUNTER — Other Ambulatory Visit: Payer: Self-pay

## 2023-05-30 ENCOUNTER — Encounter: Payer: Self-pay | Admitting: Neurology

## 2023-05-30 ENCOUNTER — Ambulatory Visit: Payer: Medicare Other | Admitting: Neurology

## 2023-05-30 ENCOUNTER — Observation Stay (HOSPITAL_COMMUNITY): Payer: Medicare Other

## 2023-05-30 ENCOUNTER — Emergency Department (HOSPITAL_COMMUNITY): Payer: Medicare Other

## 2023-05-30 ENCOUNTER — Encounter (HOSPITAL_COMMUNITY): Payer: Self-pay

## 2023-05-30 VITALS — BP 160/92 | HR 73 | Wt 156.0 lb

## 2023-05-30 DIAGNOSIS — D649 Anemia, unspecified: Secondary | ICD-10-CM | POA: Diagnosis not present

## 2023-05-30 DIAGNOSIS — F321 Major depressive disorder, single episode, moderate: Secondary | ICD-10-CM | POA: Diagnosis present

## 2023-05-30 DIAGNOSIS — K219 Gastro-esophageal reflux disease without esophagitis: Secondary | ICD-10-CM | POA: Diagnosis present

## 2023-05-30 DIAGNOSIS — R6889 Other general symptoms and signs: Secondary | ICD-10-CM | POA: Diagnosis not present

## 2023-05-30 DIAGNOSIS — Z9689 Presence of other specified functional implants: Secondary | ICD-10-CM | POA: Diagnosis not present

## 2023-05-30 DIAGNOSIS — Z91041 Radiographic dye allergy status: Secondary | ICD-10-CM | POA: Diagnosis not present

## 2023-05-30 DIAGNOSIS — G20A1 Parkinson's disease without dyskinesia, without mention of fluctuations: Secondary | ICD-10-CM | POA: Diagnosis not present

## 2023-05-30 DIAGNOSIS — Z800 Family history of malignant neoplasm of digestive organs: Secondary | ICD-10-CM

## 2023-05-30 DIAGNOSIS — Z832 Family history of diseases of the blood and blood-forming organs and certain disorders involving the immune mechanism: Secondary | ICD-10-CM

## 2023-05-30 DIAGNOSIS — I429 Cardiomyopathy, unspecified: Secondary | ICD-10-CM | POA: Diagnosis not present

## 2023-05-30 DIAGNOSIS — R569 Unspecified convulsions: Secondary | ICD-10-CM | POA: Diagnosis not present

## 2023-05-30 DIAGNOSIS — M797 Fibromyalgia: Secondary | ICD-10-CM | POA: Diagnosis present

## 2023-05-30 DIAGNOSIS — Z8049 Family history of malignant neoplasm of other genital organs: Secondary | ICD-10-CM

## 2023-05-30 DIAGNOSIS — Z881 Allergy status to other antibiotic agents status: Secondary | ICD-10-CM | POA: Diagnosis not present

## 2023-05-30 DIAGNOSIS — R55 Syncope and collapse: Secondary | ICD-10-CM | POA: Diagnosis present

## 2023-05-30 DIAGNOSIS — F411 Generalized anxiety disorder: Secondary | ICD-10-CM | POA: Diagnosis present

## 2023-05-30 DIAGNOSIS — E860 Dehydration: Secondary | ICD-10-CM | POA: Diagnosis not present

## 2023-05-30 DIAGNOSIS — M51369 Other intervertebral disc degeneration, lumbar region without mention of lumbar back pain or lower extremity pain: Secondary | ICD-10-CM | POA: Diagnosis present

## 2023-05-30 DIAGNOSIS — J984 Other disorders of lung: Secondary | ICD-10-CM | POA: Diagnosis not present

## 2023-05-30 DIAGNOSIS — Z803 Family history of malignant neoplasm of breast: Secondary | ICD-10-CM

## 2023-05-30 DIAGNOSIS — I2510 Atherosclerotic heart disease of native coronary artery without angina pectoris: Secondary | ICD-10-CM | POA: Diagnosis present

## 2023-05-30 DIAGNOSIS — G20B2 Parkinson's disease with dyskinesia, with fluctuations: Secondary | ICD-10-CM | POA: Diagnosis not present

## 2023-05-30 DIAGNOSIS — H409 Unspecified glaucoma: Secondary | ICD-10-CM | POA: Diagnosis present

## 2023-05-30 DIAGNOSIS — M5136 Other intervertebral disc degeneration, lumbar region: Secondary | ICD-10-CM | POA: Diagnosis not present

## 2023-05-30 DIAGNOSIS — I951 Orthostatic hypotension: Secondary | ICD-10-CM | POA: Diagnosis not present

## 2023-05-30 DIAGNOSIS — F0283 Dementia in other diseases classified elsewhere, unspecified severity, with mood disturbance: Secondary | ICD-10-CM | POA: Diagnosis present

## 2023-05-30 DIAGNOSIS — M810 Age-related osteoporosis without current pathological fracture: Secondary | ICD-10-CM | POA: Diagnosis present

## 2023-05-30 DIAGNOSIS — Z801 Family history of malignant neoplasm of trachea, bronchus and lung: Secondary | ICD-10-CM

## 2023-05-30 DIAGNOSIS — Z8041 Family history of malignant neoplasm of ovary: Secondary | ICD-10-CM

## 2023-05-30 DIAGNOSIS — Z7952 Long term (current) use of systemic steroids: Secondary | ICD-10-CM

## 2023-05-30 DIAGNOSIS — I5032 Chronic diastolic (congestive) heart failure: Secondary | ICD-10-CM | POA: Diagnosis not present

## 2023-05-30 DIAGNOSIS — Z882 Allergy status to sulfonamides status: Secondary | ICD-10-CM

## 2023-05-30 DIAGNOSIS — F0284 Dementia in other diseases classified elsewhere, unspecified severity, with anxiety: Secondary | ICD-10-CM | POA: Diagnosis present

## 2023-05-30 DIAGNOSIS — R079 Chest pain, unspecified: Secondary | ICD-10-CM | POA: Diagnosis not present

## 2023-05-30 DIAGNOSIS — Z79899 Other long term (current) drug therapy: Secondary | ICD-10-CM | POA: Diagnosis not present

## 2023-05-30 DIAGNOSIS — F0670 Mild neurocognitive disorder due to known physiological condition without behavioral disturbance: Secondary | ICD-10-CM | POA: Diagnosis not present

## 2023-05-30 DIAGNOSIS — Z8249 Family history of ischemic heart disease and other diseases of the circulatory system: Secondary | ICD-10-CM

## 2023-05-30 DIAGNOSIS — Z833 Family history of diabetes mellitus: Secondary | ICD-10-CM

## 2023-05-30 DIAGNOSIS — R112 Nausea with vomiting, unspecified: Secondary | ICD-10-CM | POA: Diagnosis not present

## 2023-05-30 DIAGNOSIS — G20B1 Parkinson's disease with dyskinesia, without mention of fluctuations: Secondary | ICD-10-CM | POA: Diagnosis not present

## 2023-05-30 DIAGNOSIS — R404 Transient alteration of awareness: Secondary | ICD-10-CM | POA: Diagnosis not present

## 2023-05-30 DIAGNOSIS — G8929 Other chronic pain: Secondary | ICD-10-CM | POA: Diagnosis not present

## 2023-05-30 DIAGNOSIS — E785 Hyperlipidemia, unspecified: Secondary | ICD-10-CM | POA: Diagnosis present

## 2023-05-30 DIAGNOSIS — Z888 Allergy status to other drugs, medicaments and biological substances status: Secondary | ICD-10-CM

## 2023-05-30 DIAGNOSIS — R32 Unspecified urinary incontinence: Secondary | ICD-10-CM | POA: Diagnosis present

## 2023-05-30 DIAGNOSIS — Z885 Allergy status to narcotic agent status: Secondary | ICD-10-CM | POA: Diagnosis not present

## 2023-05-30 DIAGNOSIS — G903 Multi-system degeneration of the autonomic nervous system: Principal | ICD-10-CM | POA: Diagnosis present

## 2023-05-30 DIAGNOSIS — R531 Weakness: Secondary | ICD-10-CM | POA: Diagnosis not present

## 2023-05-30 DIAGNOSIS — N3946 Mixed incontinence: Secondary | ICD-10-CM | POA: Diagnosis present

## 2023-05-30 DIAGNOSIS — Z743 Need for continuous supervision: Secondary | ICD-10-CM | POA: Diagnosis not present

## 2023-05-30 LAB — CBC
HCT: 37.6 % (ref 36.0–46.0)
Hemoglobin: 11.9 g/dL — ABNORMAL LOW (ref 12.0–15.0)
MCH: 28.3 pg (ref 26.0–34.0)
MCHC: 31.6 g/dL (ref 30.0–36.0)
MCV: 89.5 fL (ref 80.0–100.0)
Platelets: 259 10*3/uL (ref 150–400)
RBC: 4.2 MIL/uL (ref 3.87–5.11)
RDW: 13.3 % (ref 11.5–15.5)
WBC: 5.4 10*3/uL (ref 4.0–10.5)
nRBC: 0 % (ref 0.0–0.2)

## 2023-05-30 LAB — BASIC METABOLIC PANEL
Anion gap: 11 (ref 5–15)
BUN: 17 mg/dL (ref 8–23)
CO2: 26 mmol/L (ref 22–32)
Calcium: 10.8 mg/dL — ABNORMAL HIGH (ref 8.9–10.3)
Chloride: 99 mmol/L (ref 98–111)
Creatinine, Ser: 0.97 mg/dL (ref 0.44–1.00)
GFR, Estimated: >60 mL/min (ref >60)
Glucose, Bld: 111 mg/dL — ABNORMAL HIGH (ref 70–99)
Potassium: 3.8 mmol/L (ref 3.5–5.1)
Sodium: 136 mmol/L (ref 135–145)

## 2023-05-30 LAB — URINALYSIS, ROUTINE W REFLEX MICROSCOPIC
Bilirubin Urine: NEGATIVE
Glucose, UA: NEGATIVE mg/dL
Hgb urine dipstick: NEGATIVE
Ketones, ur: NEGATIVE mg/dL
Nitrite: NEGATIVE
Protein, ur: NEGATIVE mg/dL
Specific Gravity, Urine: 1.009 (ref 1.005–1.030)
pH: 7 (ref 5.0–8.0)

## 2023-05-30 LAB — TROPONIN I (HIGH SENSITIVITY)
Troponin I (High Sensitivity): 4 ng/L (ref <18)
Troponin I (High Sensitivity): 5 ng/L (ref <18)

## 2023-05-30 MED ORDER — ENOXAPARIN SODIUM 40 MG/0.4ML IJ SOSY
40.0000 mg | PREFILLED_SYRINGE | INTRAMUSCULAR | Status: DC
  Administered 2023-05-30 – 2023-06-01 (×3): 40 mg via SUBCUTANEOUS
  Filled 2023-05-30 (×2): qty 0.4

## 2023-05-30 MED ORDER — POLYVINYL ALCOHOL 1.4 % OP SOLN: 1.0000 [drp] | Freq: Every day | OPHTHALMIC | Status: DC | PRN

## 2023-05-30 MED ORDER — CARBIDOPA-LEVODOPA 25-100 MG PO TABS
2.0000 | ORAL_TABLET | Freq: Two times a day (BID) | ORAL | Status: DC
  Administered 2023-05-30 – 2023-06-02 (×6): 2 via ORAL
  Filled 2023-05-30 (×6): qty 2

## 2023-05-30 MED ORDER — POLYETHYLENE GLYCOL 3350 17 G PO PACK: 17.0000 g | PACK | Freq: Every day | ORAL | Status: DC | PRN

## 2023-05-30 MED ORDER — SERTRALINE HCL 100 MG PO TABS
100.0000 mg | ORAL_TABLET | Freq: Every day | ORAL | Status: DC
  Administered 2023-05-31 – 2023-06-02 (×3): 100 mg via ORAL
  Filled 2023-05-30 (×3): qty 1

## 2023-05-30 MED ORDER — ACETAMINOPHEN 325 MG PO TABS
650.0000 mg | ORAL_TABLET | Freq: Four times a day (QID) | ORAL | Status: DC | PRN
  Administered 2023-05-31: 650 mg via ORAL
  Filled 2023-05-30: qty 2

## 2023-05-30 MED ORDER — ACETAMINOPHEN 650 MG RE SUPP: 650.0000 mg | Freq: Four times a day (QID) | RECTAL | Status: DC | PRN

## 2023-05-30 MED ORDER — SODIUM CHLORIDE 0.9% FLUSH
3.0000 mL | Freq: Two times a day (BID) | INTRAVENOUS | Status: DC
  Administered 2023-05-30 – 2023-06-02 (×6): 3 mL via INTRAVENOUS

## 2023-05-30 MED ORDER — NORTRIPTYLINE HCL 10 MG PO CAPS
10.0000 mg | ORAL_CAPSULE | Freq: Every day | ORAL | Status: DC
  Administered 2023-05-30 – 2023-06-01 (×3): 10 mg via ORAL
  Filled 2023-05-30 (×3): qty 1

## 2023-05-30 MED ORDER — TRAMADOL HCL 50 MG PO TABS: 50.0000 mg | ORAL_TABLET | Freq: Four times a day (QID) | ORAL | Status: DC | PRN

## 2023-05-30 NOTE — ED Notes (Signed)
Lab notified of trop add-on a 2nd time.

## 2023-05-30 NOTE — ED Notes (Signed)
Notified lab about adding Troponin to previous lab

## 2023-05-30 NOTE — ED Provider Notes (Signed)
[place] EMERGENCY DEPARTMENT AT [facility] Provider Note   CSN: [id number] Arrival date & time: 05/30/23  4098     History  Chief Complaint  Patient presents with   Loss of Consciousness    [name] is a 77 y.o. female.  77 year old female with prior medical history as detailed below presents for evaluation.  Patient was being evaluated at [facility] neurology this morning.  She had a brief syncopal episode.  Please see clinic note from [facility] for description of episode.  Patient reports that she "did not feel right" shortly after adjustment of her deep brain stimulator.  She did not had brief syncope and 1 episode of emesis.  She did not have loss of pulse or decreased blood pressure or hypoxia.  On arrival to the ED the patient appears to be comfortable.  She is accompanied by her son.  Her son feels that she seems to be a little slower to mentate than her normal.  Patient with recent history of similar syncopal episode approximately week ago.  This was evaluated at [facility] facility.  Per son, patient's syncopal event was attributed to dehydration and UTI.  Patient has completed course of antibiotics for treatment of suspected UTI.  As part of this workup patient did have a short EEG -approximately 20 minutes -to evaluate for seizure activity.  Patient without history of seizure per report.  During evaluation the patient was talking softly and slowly to nursing staff.  She then had a very brief episode of approximately 30 seconds where she stared to the left and would not interact with either staff, myself, or her son.  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  After this brief episode where she did not interact, she returned to her normal and was able to answer questions appropriately.  The history is provided by the patient and medical records.       Home Medications Prior to Admission medications   Medication Sig Start  Date End Date Taking? Authorizing Provider  nortriptyline (PAMELOR) 10 MG capsule Take 1 capsule (10 mg total) by mouth at bedtime. 05/29/23   Eustaquio Boyden, MD  acetaminophen (TYLENOL) 500 MG tablet Take 2 tablets (1,000 mg total) by mouth in the morning, at noon, and at bedtime. 05/25/23   Eustaquio Boyden, MD  AMBULATORY NON [facility] MEDICATION Lift chair Dx:  Donovan Kail 09/29/20   TatOctaviano Batty, DO  Calcium Carbonate-Vitamin D (CALCIUM 600/VITAMIN D) 600-400 MG-UNIT chew tablet Chew 1 tablet by mouth daily. 12/10/17   Eustaquio Boyden, MD  carbidopa-levodopa (SINEMET IR) 25-100 MG tablet 1 po tid prn Patient taking differently: Take 2 tablets by mouth in the morning and at bedtime. 04/10/22   [name], [name], DO  cephALEXin (KEFLEX) 500 MG capsule Take 1 capsule (500 mg total) by mouth 3 (three) times daily. 02/20/23   Karie Schwalbe, MD  Cholecalciferol (VITAMIN D3) 25 MCG (1000 UT) CAPS Take 1 capsule (1,000 Units total) by mouth daily. 10/10/18   Eustaquio Boyden, MD  clonazePAM (KLONOPIN) 0.5 MG tablet TAKE 1/2TABS (0.25 MG TOTAL) BY MOUTH EVERY MORNING AND 1 TAB (0.5 MG TOTAL) AT BEDTIME. 05/28/23   Eustaquio Boyden, MD  clotrimazole-betamethasone (LOTRISONE) cream Apply 1 application topically daily. 06/07/21   Eustaquio Boyden, MD  Cranberry-Vitamin C (AZO CRANBERRY URINARY TRACT) 250-60 MG CAPS Take 2 capsules by mouth daily. 05/25/23   Eustaquio Boyden, MD  cyanocobalamin (VITAMIN B12) 1000 MCG/ML injection INJECT 1 ML (1,000  MCG TOTAL) INTO THE MUSCLE EVERY 30 DAYS. 09/22/22   Eustaquio Boyden, MD  denosumab (PROLIA) 60 MG/ML SOSY injection Inject 60 mg into the skin every 6 (six) months.    [provider]  fludrocortisone (FLORINEF) 0.1 MG tablet TAKE 1 TABLET BY MOUTH EVERY DAY 01/16/23   Eustaquio Boyden, MD  Glycerin-Hypromellose-PEG 400 (ARTIFICIAL TEARS) 0.2-0.2-1 % SOLN Place 1 drop into both eyes daily as needed (dry eyes).    [provider]  latanoprost  (XALATAN) 0.005 % ophthalmic solution Place 1 drop into both eyes at bedtime. 03/22/21   [provider]  nystatin cream (MYCOSTATIN) Apply 1 Application topically 2 (two) times daily. To affected area/rash 05/25/23   Eustaquio Boyden, MD  sertraline (ZOLOFT) 100 MG tablet Take 1 tablet (100 mg total) by mouth daily. 01/10/23   Eustaquio Boyden, MD  SYRINGE-NEEDLE, DISP, 3 ML (BD SAFETYGLIDE SYRINGE/NEEDLE) 25G X 1" 3 ML MISC Use to inject vitamin B12 monthly. 12/19/21   Eustaquio Boyden, MD  traMADol (ULTRAM) 50 MG tablet Take 1 tablet (50 mg total) by mouth every 6 (six) hours as needed for moderate pain. 04/10/23   Eustaquio Boyden, MD      Allergies    Iohexol, Amitriptyline, Ciprofloxacin, Cymbalta [duloxetine hcl], Lyrica [pregabalin], Imipramine hcl, Iodine, Lidocaine hcl, Morphine sulfate, Neosporin [neomycin-bacitracin zn-polymyx], Sulfamethoxazole, and Tetracyclines & related    Review of Systems   Review of Systems  Cardiovascular:  Positive for syncope.  All other systems reviewed and are negative.   Physical Exam Updated Vital Signs BP (!) 136/107   Pulse 78   Temp (!) 97.5 F (36.4 C) (Oral)   Resp 15   Ht 5\' 6"  (1.676 m)   Wt 70.8 kg   SpO2 99%   BMI 25.18 kg/m  Physical Exam Vitals and nursing note reviewed.  Constitutional:      General: She is not in acute distress.    Appearance: She is well-developed.  HENT:     Head: Normocephalic and atraumatic.  Eyes:     Conjunctiva/sclera: Conjunctivae normal.     Pupils: Pupils are equal, round, and reactive to light.  Cardiovascular:     Rate and Rhythm: Normal rate and regular rhythm.     Heart sounds: Normal heart sounds.  Pulmonary:     Effort: Pulmonary effort is normal. No respiratory distress.     Breath sounds: Normal breath sounds.  Abdominal:     General: There is no distension.     Palpations: Abdomen is soft.     Tenderness: There is no abdominal tenderness.  Musculoskeletal:         General: No deformity. Normal range of motion.     Cervical back: Normal range of motion and neck supple.  Skin:    General: Skin is warm and dry.  Neurological:     General: No focal deficit present.     Mental Status: She is alert and oriented to person, place, and time.     Comments: Alert, oriented x 3, parkinsonian tremor present     ED Results / Procedures / Treatments   Labs (all labs ordered are listed, but only abnormal results are displayed) Labs Reviewed  BASIC METABOLIC PANEL - Abnormal; Notable for the following components:      Result Value   Glucose, Bld 111 (*)    Calcium 10.8 (*)    All other components within normal limits  CBC - Abnormal; Notable for the following components:   Hemoglobin 11.9 (*)  All other components within normal limits  URINALYSIS, ROUTINE W REFLEX MICROSCOPIC  TROPONIN I (HIGH SENSITIVITY)  TROPONIN I (HIGH SENSITIVITY)    EKG EKG Interpretation Date/Time:  Wednesday May 30 2023 10:06:19 EDT Ventricular Rate:  70 PR Interval:  210 QRS Duration:  96 QT Interval:  391 QTC Calculation: 422 R Axis:   -12  Text Interpretation: Sinus rhythm Confirmed by Kristine Royal [phone]) on 05/30/2023 10:23:27 AM  Radiology CT Head Wo Contrast  Result Date: 05/30/2023 CLINICAL DATA:  Syncope/presyncope. Cerebrovascular cause suspected. EXAM: CT HEAD WITHOUT CONTRAST TECHNIQUE: Contiguous axial images were obtained from the base of the skull through the vertex without intravenous contrast. RADIATION DOSE REDUCTION: This exam was performed according to the departmental dose-optimization program which includes automated exposure control, adjustment of the mA and/or kV according to patient size and/or use of iterative reconstruction technique. COMPARISON:  07/22/2021 FINDINGS: Brain: Deep brain stimulators in place within both thalamic regions. No complicating feature or apparent change. The brain otherwise does not show any evidence of old or acute  infarction, mass lesion, hemorrhage, hydrocephalus or extra-axial collection. Vascular: No abnormal vascular finding. Skull: Negative other than the procedural burr holes. Sinuses/Orbits: Clear/normal Other: None IMPRESSION: No acute finding by CT. Deep brain stimulators in place within both thalamic regions. No complicating feature or apparent change. Electronically Signed   By: Paulina Fusi M.D.   On: 05/30/2023 11:21   DG Chest Port 1 View  Result Date: 05/30/2023 CLINICAL DATA:  Weakness EXAM: PORTABLE CHEST 1 VIEW COMPARISON:  04/26/2022 FINDINGS: Generator pack overlies the right chest. Heart size is normal. Mediastinal shadows are normal. Mild pleural and parenchymal scarring seen at the lung apices. No evidence of active infiltrate, collapse or effusion. Old healed rib fractures on the right. IMPRESSION: No active disease. Mild pleural and parenchymal scarring at the lung apices. Old healed rib fractures on the right. Electronically Signed   By: Paulina Fusi M.D.   On: 05/30/2023 11:08    Procedures Procedures    Medications Ordered in ED Medications - No data to display  ED Course/ Medical Decision Making/ A&P                             Medical Decision Making Amount and/or Complexity of Data Reviewed Labs: ordered. Radiology: ordered.  Risk Decision regarding hospitalization.    Medical Screen Complete  This patient presented to the ED with complaint of syncope.  This complaint involves an extensive number of treatment options. The initial differential diagnosis includes, but is not limited to, idiopathic syncope, arrhythmia, seizure, etc.  This presentation is: Acute, Chronic, Self-Limited, Previously Undiagnosed, Uncertain Prognosis, Complicated, Systemic Symptoms, and Threat to Life/Bodily Function  Patient presented from [facility] neurology after witnessed syncopal episode.  Please see note from [facility] neurology from this morning for description of syncope as  witnessed by treatment team there.  Patient with prior syncope approximately 1 week ago with prior inpatient workup at [facility].  During evaluation today patient did have brief 30 second episode where she stared to the left and was not interactive with the evaluator.  Concerning for possible seizure-like episode.  Case discussed with Dr. Selina Cooley -Neurology.  Neuro will consult.  They recommended admission for additional workup.  Hospitalist service made aware of case and will evaluate for admission.   Additional history obtained:  Additional history obtained from [facility] External records from outside sources obtained and reviewed including prior ED visits and  prior Inpatient records.    Lab Tests:  I ordered and personally interpreted labs.    Imaging Studies ordered:  I ordered imaging studies including CT head, chest x-ray I independently visualized and interpreted obtained imaging which showed NAD I agree with the radiologist interpretation.   Cardiac Monitoring:  The patient was maintained on a cardiac monitor.  I personally viewed and interpreted the cardiac monitor which showed an underlying rhythm of: NSR   Problem List / ED Course:  Syncope, AMS   Reevaluation:  After the interventions noted above, I reevaluated the patient and found that they have: improved   Disposition:  After consideration of the diagnostic results and the patients response to treatment, I feel that the patent would benefit from admission.          Final Clinical Impression(s) / ED Diagnoses Final diagnoses:  Syncope, unspecified syncope type    Rx / DC Orders ED Discharge Orders     None         Wynetta Fines, MD 05/30/23 [phone]

## 2023-05-30 NOTE — Progress Notes (Signed)
LTM EEG running - no initial skin breakdown - push button tested - neuro notified. 

## 2023-05-30 NOTE — ED Notes (Signed)
Patient transported to CT 

## 2023-05-30 NOTE — ED Notes (Signed)
Unsuccessful IV attempt and blood draw.  IV team consulted.

## 2023-05-30 NOTE — ED Notes (Signed)
X-ray at bedside

## 2023-05-30 NOTE — Progress Notes (Signed)
Patient arrived to unit. 2 nurse skin assessment performed. Rash noted to right forearm and bend of the arm on the right. Telemetry applied. EEG being placed by technicians.

## 2023-05-30 NOTE — H&P (Signed)
History and Physical   [name]:[id number] DOB: Jan 13, 1946 DOA: 05/30/2023  PCP: Eustaquio Boyden, MD   Patient coming from: Neurologist office  Chief Complaint: Syncope  HPI: [name] is a 77 y.o. female with medical history significant of syncope, orthostasis, Parkinson's disease, neurocognitive decline, hyperlipidemia, GERD, CAD, anemia, urinary continence, depression, anxiety, fibromyalgia, degenerative disc disease presenting following episode of syncope and collapse.  Patient was being evaluated at neurologist office earlier today for chronic Parkinson disease.  Patient had her deep brain stimulator adjusted and following this patient stated she felt unusual.  She then had episode of syncope with an episode of emesis during.  Per note from that visit her blood pressure and pulse remained normal during the episode.  And she did have a Zio patch on.  Patient was sent to the ED for further evaluation.  Notably patient had a similar episode a week ago and was seen at [facility] and did have 20-minute EEG which looked okay and her symptoms were attributed to dehydration and UTI.  Has completed antibiotics.  Patient's son accompanies her and feels like she has had slower responses that is typical after the event.  While in the ED patient had an additional episode where she was staring off to the left and was not responding to folks in the ED.  After that she slowly returned to baseline.  Neurology consulted.  She denies fevers, chills, chest pain, shortness of breath, abdominal pain, constipation, diarrhea.  ED Course: Vital signs in the ED notable for blood pressure in the 110s to 160s systolic.  Lab workup included BMP with glucose 111, calcium 10.8.  CBC with hemoglobin stable at 11.9.  Troponin negative with repeat pending.  Urinalysis with small leukocytes and rare bacteria only.  Chest x-ray with no acute normality did show mild chronic scarring.  CT head showed no acute  abnormality and showed deep brain stimulator in place.  Neurology consulted.  Review of Systems: As per HPI otherwise all other systems reviewed and are negative.  Past Medical History:  Diagnosis Date   Allergy    ANEMIA-NOS 09/25/2007   Anxiety    Arthritis    B12 DEFICIENCY 05/03/2007   Blood transfusion without reported diagnosis    CAD (coronary artery disease) 01/2010   MI, Nishan   CAP (community acquired pneumonia) 03/29/2022   Cardiomyopathy (HCC) 02/08/2010   H/o this 2012 after urosepsis, no recurrence.    Cataract    CHF (congestive heart failure) (HCC)    with episode of sepsis   Complication of anesthesia    Depression    not recently   FIBROMYALGIA 05/03/2007   Fibromyalgia    GERD 02/22/2010   Glaucoma 02/2013   [facility]   Headache    History of CHF (congestive heart failure) 01/2010   History of colon polyps 2004   HYPERLIPIDEMIA 12/19/2007   HYPOTENSION, ORTHOSTATIC 12/06/2008   Interstitial cystitis    [place] now Dr Logan Bores   Lupus (systemic lupus erythematosus) (HCC) 02/08/2010   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/08/2010   OSTEOPOROSIS 08/2009   bisphosphonate on hold 2/2 dysphagia, on reclast done in August each year   Parkinson's disease 08/25/2015   Dx Dr [name] 07/2015    Pneumonia    PONV (postoperative nausea and vomiting)    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   Takotsubo cardiomyopathy 2008   due to E coli urosepsis    Past Surgical  History:  Procedure Laterality Date   ABDOMINAL HYSTERECTOMY  1970s   IUD infection - first partial then with oophorectomy (cysts), complication - low blood pressure   CATARACT EXTRACTION Bilateral    CHOLECYSTECTOMY     complication - low blood pressure   COLONOSCOPY  06/2008   h/o polyps but latest WNL, rec rpt 10 yrs Juanda Chance)   COLONOSCOPY  11/2018   multiple TAs (10 polyps total), rpt 2 yrs Russella Dar)   COLONOSCOPY  04/2021   multiple TAs, rpt 3 yrs Russella Dar)    CYSTOSCOPY  12/2013   abx treatment for recurrent cystitis   DEXA  04/2013   T -2.9 @ femur, -1.6 @ spine   DEXA  04/2017   T -2.9 hip, -0.7 spine   ESOPHAGOGASTRODUODENOSCOPY  12/2017   WNL, regardless esophagus dilated, small HH Russella Dar)   EYE SURGERY     LUMBAR LAMINECTOMY/DECOMPRESSION MICRODISCECTOMY Right 11/27/2019   Right Lumbar Four-Five foraminotomy;  Maeola Harman, MD)   LUMBAR LAMINECTOMY/DECOMPRESSION MICRODISCECTOMY Right 05/16/2022   Procedure: OPEN LUMBAR LAMINECTOMY, RT, L45 W/LATERAL RECESS DECOMPRESSION;  Surgeon: Bethann Goo, DO;  Location: [facility] OR;  Service: Neurosurgery;  Laterality: Right;  3C   MINOR PLACEMENT OF FIDUCIAL N/A 06/30/2021   Procedure: MINOR PLACEMENT OF FIDUCIAL;  Surgeon: Maeola Harman, MD;  Location: [facility] OR;  Service: Neurosurgery;  Laterality: N/A;  Minor room   PTOSIS REPAIR Bilateral 10/2020   Plastic Surgery   PULSE GENERATOR IMPLANT Right 07/14/2021   Procedure: UNILATERAL PULSE GENERATOR IMPLANT;  Surgeon: Maeola Harman, MD;  Location: [facility] OR;  Service: Neurosurgery;  Laterality: Right;   SUBTHALAMIC STIMULATOR INSERTION Bilateral 07/07/2021   Procedure: Deep brain stimulator placement;  Surgeon: Maeola Harman, MD;  Location[facility] OR;  Service: Neurosurgery;  Laterality: Bilateral;   TUBAL LIGATION     UPPER GASTROINTESTINAL ENDOSCOPY      Social History  reports that she has never smoked. She has never used smokeless tobacco. She reports that she does not currently use drugs. She reports that she does not drink alcohol.  Allergies  Allergen Reactions   Iohexol Hives and Shortness Of Breath     Code: HIVES, Desc: PT developed 2 hives, followed by SOB, severe headache post 87cc's Omnipaque 300., Onset Date: [id number]    Amitriptyline Other (See Comments)    Sedated next morning   Ciprofloxacin Nausea And Vomiting   Cymbalta [Duloxetine Hcl] Other (See Comments)    Worsened depression   Lyrica [Pregabalin] Other (See Comments)    Tried  during hospitalization - unsure effects but unable to tolerate   Imipramine Hcl Rash   Iodine Rash   Lidocaine Hcl Rash   Morphine Sulfate Rash   Neosporin [Neomycin-Bacitracin Zn-Polymyx] Rash and Other (See Comments)    Worsened skin breaking out   Sulfamethoxazole Rash   Tetracyclines & Related Rash    Family History  Problem Relation Age of Onset   Heart attack Father    Diabetes Father    Prostate cancer Father    Esophageal cancer Mother    Lung cancer Brother    Breast cancer Sister    Ovarian cancer Sister    Lung cancer Brother    CAD Brother    Uterine cancer Sister    Clotting disorder Son    Healthy Son    Healthy Daughter    Healthy Daughter    Colon cancer Neg Hx    Rectal cancer Neg Hx    Stomach cancer Neg Hx   Reviewed  on admission  Prior to Admission medications   Medication Sig Start Date End Date Taking? Authorizing Provider  nortriptyline (PAMELOR) 10 MG capsule Take 1 capsule (10 mg total) by mouth at bedtime. 05/29/23   Eustaquio Boyden, MD  acetaminophen (TYLENOL) 500 MG tablet Take 2 tablets (1,000 mg total) by mouth in the morning, at noon, and at bedtime. 05/25/23   Eustaquio Boyden, MD  AMBULATORY NON [facility] MEDICATION Lift chair Dx:  Donovan Kail 09/29/20   TatOctaviano Batty, DO  Calcium Carbonate-Vitamin D (CALCIUM 600/VITAMIN D) 600-400 MG-UNIT chew tablet Chew 1 tablet by mouth daily. 12/10/17   Eustaquio Boyden, MD  carbidopa-levodopa (SINEMET IR) 25-100 MG tablet 1 po tid prn Patient taking differently: Take 2 tablets by mouth in the morning and at bedtime. 04/10/22   [name], [name], DO  Cholecalciferol (VITAMIN D3) 25 MCG (1000 UT) CAPS Take 1 capsule (1,000 Units total) by mouth daily. 10/10/18   Eustaquio Boyden, MD  clonazePAM (KLONOPIN) 0.5 MG tablet TAKE 1/2TABS (0.25 MG TOTAL) BY MOUTH EVERY MORNING AND 1 TAB (0.5 MG TOTAL) AT BEDTIME. 05/28/23   Eustaquio Boyden, MD  clotrimazole-betamethasone (LOTRISONE) cream Apply 1 application topically  daily. 06/07/21   Eustaquio Boyden, MD  Cranberry-Vitamin C (AZO CRANBERRY URINARY TRACT) 250-60 MG CAPS Take 2 capsules by mouth daily. 05/25/23   Eustaquio Boyden, MD  cyanocobalamin (VITAMIN B12) 1000 MCG/ML injection INJECT 1 ML (1,000 MCG TOTAL) INTO THE MUSCLE EVERY 30 DAYS. 09/22/22   Eustaquio Boyden, MD  denosumab (PROLIA) 60 MG/ML SOSY injection Inject 60 mg into the skin every 6 (six) months.    [provider]  fludrocortisone (FLORINEF) 0.1 MG tablet TAKE 1 TABLET BY MOUTH EVERY DAY 01/16/23   Eustaquio Boyden, MD  Glycerin-Hypromellose-PEG 400 (ARTIFICIAL TEARS) 0.2-0.2-1 % SOLN Place 1 drop into both eyes daily as needed (dry eyes).    [provider]  latanoprost (XALATAN) 0.005 % ophthalmic solution Place 1 drop into both eyes at bedtime. 03/22/21   [provider]  nystatin cream (MYCOSTATIN) Apply 1 Application topically 2 (two) times daily. To affected area/rash 05/25/23   Eustaquio Boyden, MD  sertraline (ZOLOFT) 100 MG tablet Take 1 tablet (100 mg total) by mouth daily. 01/10/23   Eustaquio Boyden, MD  SYRINGE-NEEDLE, DISP, 3 ML (BD SAFETYGLIDE SYRINGE/NEEDLE) 25G X 1" 3 ML MISC Use to inject vitamin B12 monthly. 12/19/21   Eustaquio Boyden, MD  traMADol (ULTRAM) 50 MG tablet Take 1 tablet (50 mg total) by mouth every 6 (six) hours as needed for moderate pain. 04/10/23   Eustaquio Boyden, MD    Physical Exam: Vitals:   05/30/23 1030 05/30/23 1130 05/30/23 1200 05/30/23 1230  BP: (!) 136/107 129/65 137/84   Pulse: 78 72 74 76  Resp: 15 11 16 17   Temp:      TempSrc:      SpO2: 99% 98% 98% 98%  Weight:      Height:        Physical Exam Constitutional:      General: She is not in acute distress.    Appearance: Normal appearance.  HENT:     Head: Normocephalic and atraumatic.     Mouth/Throat:     Mouth: Mucous membranes are moist.     Pharynx: Oropharynx is clear.  Eyes:     Extraocular Movements: Extraocular movements intact.      Pupils: Pupils are equal, round, and reactive to light.  Cardiovascular:     Rate and Rhythm: Normal rate and regular  rhythm.     Pulses: Normal pulses.     Heart sounds: Normal heart sounds.  Pulmonary:     Effort: Pulmonary effort is normal. No respiratory distress.     Breath sounds: Normal breath sounds.  Abdominal:     General: Bowel sounds are normal. There is no distension.     Palpations: Abdomen is soft.     Tenderness: There is no abdominal tenderness.  Musculoskeletal:        General: No swelling or deformity.  Skin:    General: Skin is warm and dry.  Neurological:     General: No focal deficit present.     Mental Status: Mental status is at baseline.    Labs on Admission: I have personally reviewed following labs and imaging studies  CBC: Recent Labs  Lab 05/30/23 1012  WBC 5.4  HGB 11.9*  HCT 37.6  MCV 89.5  PLT 259    Basic Metabolic Panel: Recent Labs  Lab 05/30/23 1012  NA 136  K 3.8  CL 99  CO2 26  GLUCOSE 111*  BUN 17  CREATININE 0.97  CALCIUM 10.8*    GFR: Estimated Creatinine Clearance: 47.6 mL/min (by C-G formula based on SCr of 0.97 mg/dL).  Liver Function Tests: No results for input(s): "AST", "ALT", "ALKPHOS", "BILITOT", "PROT", "ALBUMIN" in the last 168 hours.  Urine analysis:    Component Value Date/Time   COLORURINE YELLOW 05/30/2023 1013   APPEARANCEUR CLEAR 05/30/2023 1013   LABSPEC 1.009 05/30/2023 1013   PHURINE 7.0 05/30/2023 1013   GLUCOSEU NEGATIVE 05/30/2023 1013   HGBUR NEGATIVE 05/30/2023 1013   HGBUR trace-lysed 09/02/2010 0954   BILIRUBINUR NEGATIVE 05/30/2023 1013   BILIRUBINUR negative 02/20/2023 0958   KETONESUR NEGATIVE 05/30/2023 1013   PROTEINUR NEGATIVE 05/30/2023 1013   UROBILINOGEN 0.2 02/20/2023 0958   UROBILINOGEN 0.2 09/04/2010 1342   NITRITE NEGATIVE 05/30/2023 1013   LEUKOCYTESUR SMALL (A) 05/30/2023 1013    Radiological Exams on Admission: CT Head Wo Contrast  Result Date:  05/30/2023 CLINICAL DATA:  Syncope/presyncope. Cerebrovascular cause suspected. EXAM: CT HEAD WITHOUT CONTRAST TECHNIQUE: Contiguous axial images were obtained from the base of the skull through the vertex without intravenous contrast. RADIATION DOSE REDUCTION: This exam was performed according to the departmental dose-optimization program which includes automated exposure control, adjustment of the mA and/or kV according to patient size and/or use of iterative reconstruction technique. COMPARISON:  07/22/2021 FINDINGS: Brain: Deep brain stimulators in place within both thalamic regions. No complicating feature or apparent change. The brain otherwise does not show any evidence of old or acute infarction, mass lesion, hemorrhage, hydrocephalus or extra-axial collection. Vascular: No abnormal vascular finding. Skull: Negative other than the procedural burr holes. Sinuses/Orbits: Clear/normal Other: None IMPRESSION: No acute finding by CT. Deep brain stimulators in place within both thalamic regions. No complicating feature or apparent change. Electronically Signed   By: Paulina Fusi M.D.   On: 05/30/2023 11:21   DG Chest Port 1 View  Result Date: 05/30/2023 CLINICAL DATA:  Weakness EXAM: PORTABLE CHEST 1 VIEW COMPARISON:  04/26/2022 FINDINGS: Generator pack overlies the right chest. Heart size is normal. Mediastinal shadows are normal. Mild pleural and parenchymal scarring seen at the lung apices. No evidence of active infiltrate, collapse or effusion. Old healed rib fractures on the right. IMPRESSION: No active disease. Mild pleural and parenchymal scarring at the lung apices. Old healed rib fractures on the right. Electronically Signed   By: Paulina Fusi M.D.   On: 05/30/2023 11:08  EKG: Independently reviewed.  Sinus rhythm at 70 bpm.  Nonspecific T wave changes.  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 > Patient had a second episode in the ED where she was staring off to the left and not responding for 30 seconds to a minute and then slowly became more responsive. > Similar episode a week ago seen at [facility] for this attributed to possible dehydration/UTI at that time. > History of orthostasis also noted and prior Rx for florinef (will work with pharmacy to confirm.) > After episode in the ED, neurology consulted and are following. - Monitor on telemetry - Appreciate neurology recommendations - Hold off on echocardiogram as one was done in the last month - Orthostatic vital signs - EEG per neurology  Parkinson's disease Dementia - Continue home carbidopa-levodopa - Has deep brain stimulator in place  Depression Anxiety - Continue nortriptyline, sertraline  Fibromyalgia Degenerative disc disease - Continue home tramadol - Continue home sertraline, nortriptyline as above  Hyperlipidemia GERD CAD - Not currently on any medications for these  Anemia > Hemoglobin stable 11.9 - Trend CBC  DVT prophylaxis: Lovenox Code Status:   Full Family Communication:  Updated son by phone  Disposition Plan:   Patient is from:  Home  Anticipated DC to:  Home  Anticipated DC date:  1 to 3 days  Anticipated DC barriers: None  Consults called:  Neurology Admission status:  Observation, telemetry  Severity of Illness: The appropriate patient status for this patient is OBSERVATION. Observation status is judged to be reasonable and  necessary in order to provide the required intensity of service to ensure the patient's safety. The patient's presenting symptoms, physical exam findings, and initial radiographic and laboratory data in the context of their medical condition is felt to place them at decreased risk for further clinical deterioration. Furthermore, it is anticipated that the patient will be medically stable for discharge from the hospital within 2 midnights of admission.    Synetta Fail MD [facility]  How to contact the [facility] Attending or Consulting provider 7A - 7P or covering provider during after hours 7P -7A, for this patient?   Check the care team in [facility] and look for a) attending/consulting [facility] provider listed and b) the [facility] team listed Log into [email or web address] and use [place]'s universal password to access. If you do not have the password, please contact the hospital operator. Locate the [facility] provider you are looking for under [facility] and page to a number that you can be directly reached. If you still have difficulty reaching the provider, please page the [facility] (Director on Call) for the Hospitalists listed on amion for assistance.  05/30/2023, 1:26 PM

## 2023-05-30 NOTE — ED Triage Notes (Signed)
Per EM, Pt, from Neurology Office, presents after a syncopal episode.  Pt's son reports this is the 2nd episode in less than 2 weeks.  Pt denies pain.  C/o "feeling funny."  Pt is currently wearing a heart monitor.  Hx of Parkinson's.

## 2023-05-30 NOTE — Consult Note (Signed)
Neurology Consultation  Reason for Consult: Syncope/concern for seizures Referring Physician: Dr. Rodena Medin  CC: Syncope with LOC  History is obtained from: Patient and son at bedside and medical record   HPI: [name] is a 77 y.o. female with past medical history of CAD, anxiety and depression,Parkinson's disease s/p DBS (reprogrammed today at neurology office by Dr. Arbutus Leas), CHF, hyperlipidemia, neurogenic orthostatic hypotension, dizziness, vitamin D deficiency, fibromyalgia, GERD, glaucoma presents to [facility], ED for evaluation of multiple syncopal episodes at her neurology appointment today.  Her chart review following her DBS reprogramming patient reported not feeling well and had a syncopal episode while sitting down.  Per notes from blood pressure and pulse were normal during the episode she was placed in a wheelchair and brought to the bed.  She was placed on the bed and stated that she did not feel well and had multiple (6-8) syncopal episodes and was easily aroused by sternal rub.  BP and heart rate was normal during events with no arrhythmias noted, however patient is wearing a Zio patch.  No seizure-like activity or incontinence of urine or stool were noted.  Per attending's discussion with Dr. Arbutus Leas she had no gaze deviation during these events. Son states that today's episode took her longer to come around than normal, appears to be slower in her mental status  Son is at the bedside and states to have no been any other syncopal episodes since the admission at [facility] until today.  Lives with her son and daughter.  Son states that last time she had a syncopal event, prior to last week's event was about 5 years ago which at that time was said to be due to neurogenic orthostatic hypotension due to the Parkinson's.  Patient states that just prior to the episodes today and 2 weeks ago she states that she just does not feel right, she cannot describe the feeling.  Chart review, she  presented to [facility] hospital on 05/20/2023 having multiple syncopal episodes while in church, with loss of consciousness for a few seconds.  There is no report of seizure-like activity or incontinence noted.  Workup at [facility] revealed that orthostatic vitals were negative, CT head, routine EEG and EKG, carotid ultrasound had no acute findings.  2D echo revealed normal EF, no valvular abnormalities.  Etiology of syncopal episodes likely related to UTI and dehydration per chart review she was sent home on antibiotics she has completed the course of antibiotics  In the ED today during evaluation by the EDP, she had an episode where she was staring off to the left and would not interact with anyone lasting about 30 seconds Neurology consulted.    ROS: Full ROS was performed and is negative except as noted in the HPI.   Past Medical History:  Diagnosis Date   Allergy    ANEMIA-NOS 09/25/2007   Anxiety    Arthritis    B12 DEFICIENCY 05/03/2007   Blood transfusion without reported diagnosis    CAD (coronary artery disease) 01/2010   MI, Nishan   CAP (community acquired pneumonia) 03/29/2022   Cardiomyopathy (HCC) 02/08/2010   H/o this 2012 after urosepsis, no recurrence.    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   OSTEOPOROSIS 08/2009   bisphosphonate on hold 2/2 dysphagia, on reclast done in August each year   Parkinson's disease 08/25/2015   Dx Dr [name] 07/2015     Pneumonia    PONV (postoperative nausea and vomiting)    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 She has never used smokeless tobacco. She reports that she does not currently use drugs. She reports that she does not drink alcohol.  Medications No current facility-administered medications for this encounter.  Current Outpatient Medications:    nortriptyline (PAMELOR) 10 MG capsule, Take 1 capsule (10 mg total) by mouth at bedtime., Disp: , Rfl:    acetaminophen (TYLENOL) 500 MG tablet, Take 2 tablets (1,000 mg total) by mouth in the morning, at noon, and at bedtime., Disp: , Rfl:    AMBULATORY NON FORMULARY MEDICATION, Lift chair Dx:  G20, Disp: 1 Device, Rfl: 0   Calcium Carbonate-Vitamin D (CALCIUM 600/VITAMIN D) 600-400 MG-UNIT chew tablet, Chew 1 tablet by mouth daily., Disp: , Rfl:    carbidopa-levodopa (SINEMET IR) 25-100 MG tablet, 1 po tid prn (Patient taking differently: Take 2 tablets by mouth in the morning and at bedtime.), Disp: 270 tablet, Rfl: 1   cephALEXin (KEFLEX) 500 MG capsule, Take 1 capsule (500 mg total) by mouth 3 (three) times daily., Disp: 21 capsule, Rfl: 0   Cholecalciferol (VITAMIN D3) 25 MCG (1000 UT) CAPS, Take 1 capsule (1,000 Units total) by mouth daily., Disp: 30 capsule, Rfl:    clonazePAM (KLONOPIN) 0.5 MG tablet, TAKE 1/2TABS (0.25 MG TOTAL) BY MOUTH EVERY MORNING AND 1 TAB (0.5 MG  TOTAL) AT BEDTIME., Disp: 45 tablet, Rfl: 0   clotrimazole-betamethasone (LOTRISONE) cream, Apply 1 application topically daily., Disp: 30 g, Rfl: 0   Cranberry-Vitamin C (AZO CRANBERRY URINARY TRACT) 250-60 MG CAPS, Take 2 capsules by mouth daily., Disp: , Rfl:    cyanocobalamin (VITAMIN B12) 1000 MCG/ML injection, INJECT 1 ML (1,000 MCG TOTAL) INTO THE MUSCLE EVERY 30 DAYS., Disp: 3 mL, Rfl: 2   denosumab (PROLIA) 60 MG/ML SOSY injection, Inject 60 mg into the skin every 6 (six) months., Disp: , Rfl:    fludrocortisone (FLORINEF) 0.1 MG tablet, TAKE 1 TABLET BY MOUTH EVERY DAY, Disp: 30 tablet, Rfl: 6   Glycerin-Hypromellose-PEG 400 (ARTIFICIAL TEARS) 0.2-0.2-1 % SOLN, Place 1 drop into both eyes daily as needed (dry eyes)., Disp: ,  Rfl:    latanoprost (XALATAN) 0.005 % ophthalmic solution, Place 1 drop into both eyes at bedtime., Disp: , Rfl:    nystatin cream (MYCOSTATIN), Apply 1 Application topically 2 (two) times daily. To affected area/rash, Disp: 30 g, Rfl: 0   sertraline (ZOLOFT) 100 MG tablet, Take 1 tablet (100 mg total) by mouth daily., Disp: 100 tablet, Rfl: 3   SYRINGE-NEEDLE, DISP, 3 ML (BD SAFETYGLIDE SYRINGE/NEEDLE) 25G X 1" 3 ML MISC, Use to inject vitamin B12 monthly., Disp: 50 each, Rfl: 0   traMADol (ULTRAM) 50 MG tablet, Take 1 tablet (50 mg total) by mouth every 6 (six) hours as needed for moderate pain., Disp: 30 tablet, Rfl: 3   Exam: Current vital signs: BP (!) 136/107   Pulse 78   Temp (!) 97.5 F (36.4 C) (Oral)   Resp 15   Ht 5\' 6"  (1.676 m)   Wt 70.8 kg   SpO2 99%   BMI 25.18 kg/m  Vital signs in last 24 hours: Temp:  [97.5 F (36.4 C)] 97.5 F (36.4 C) (07/03 1008) Pulse Rate:  [71-78] 78 (07/03 1030) Resp:  [15] 15 (07/03 1030) BP: (110-160)/(70-107) 136/107 (07/03 1030) SpO2:  [92 %-99 %] 99 % (07/03 1030) Weight:  [70.8 kg] 70.8 kg (07/03 1009)  GENERAL: Awake, alert in NAD HEENT: - Normocephalic and atraumatic, dry mm LUNGS - Clear to  auscultation bilaterally with no wheezes CV - S1S2 RRR, no m/r/g, equal pulses bilaterally. ABDOMEN - Soft, nontender, nondistended with normoactive BS Ext: warm, well perfused, intact peripheral pulses, no edema  NEURO:  Mental Status: AA&Ox3 Language: speech is, hypophonic speech naming, repetition, fluency, and comprehension intact. Cranial Nerves: PERRL EOMI, visual fields full, chronic left facial asymmetry, facial sensation intact, hearing intact, tongue/uvula/soft palate midline, normal sternocleidomastoid and trapezius muscle strength. No evidence of tongue atrophy or fibrillations Motor: 4/5 in all 4 extremities Tone: is normal and bulk is normal Sensation- Intact to light touch bilaterally Coordination: FTN intact bilaterally Gait- deferred   Labs I have reviewed labs in epic and the results pertinent to this consultation are:  CBC    Component Value Date/Time   WBC 5.4 05/30/2023 1012   RBC 4.20 05/30/2023 1012   HGB 11.9 (L) 05/30/2023 1012   HCT 37.6 05/30/2023 1012   PLT 259 05/30/2023 1012   MCV 89.5 05/30/2023 1012   MCH 28.3 05/30/2023 1012   MCHC 31.6 05/30/2023 1012   RDW 13.3 05/30/2023 1012   LYMPHSABS 2.1 01/03/2023 0846   MONOABS 0.3 01/03/2023 0846   EOSABS 0.1 01/03/2023 0846   BASOSABS 0.1 01/03/2023 0846    CMP     Component Value Date/Time   NA 136 05/30/2023 1012   K 3.8 05/30/2023 1012   CL 99 05/30/2023 1012   CO2 26 05/30/2023 1012   GLUCOSE 111 (H) 05/30/2023 1012   GLUCOSE 93 09/05/2006 1528   BUN 17 05/30/2023 1012   CREATININE 0.97 05/30/2023 1012   CREATININE 0.87 10/08/2019 0921   CALCIUM 10.8 (H) 05/30/2023 1012   PROT 6.9 01/03/2023 0846   ALBUMIN 4.3 01/03/2023 0846   AST 13 01/03/2023 0846   ALT 9 01/03/2023 0846   ALKPHOS 62 01/03/2023 0846   BILITOT 0.4 01/03/2023 0846   GFRNONAA >60 05/30/2023 1012   GFRAA >60 11/24/2019 1135    Lipid Panel     Component Value Date/Time   CHOL 227 (H) 01/03/2023 0846   TRIG  109.0 01/03/2023 0846   HDL 58.10 01/03/2023 0846  CHOLHDL 4 01/03/2023 0846   VLDL 21.8 01/03/2023 0846   LDLCALC 147 (H) 01/03/2023 0846   LDLDIRECT 105.5 07/20/2010 1146    Lab Results  Component Value Date   HGBA1C 5.9 04/26/2022      Imaging I have reviewed the images obtained:  CT-head-no acute process  Assessment:  77 y.o. female with past medical history of CAD, anxiety and depression,Parkinson's disease s/p DBS (reprogrammed today at neurology office by Dr. Arbutus Leas), CHF, hyperlipidemia, neurogenic orthostatic hypotension, dizziness, vitamin D deficiency, fibromyalgia, GERD, glaucoma presents to [facility], ED for evaluation of multiple syncopal episodes at her neurology appointment today.   Impression: Syncope versus seizure  Recommendations: -LTM to evaluate for seizures -Zio patch on, and to interrogate Zio patch and see if there were any concerning arrhythmias during these events or just prior events -Will hold off on starting AEDs until LTM has been completed - TTE at recent OSH admission unremarkable - Tele - Orthostatic vitals -Neurology will continue to follow  Gevena Mart DNP, ACNPC-AG  [facility]   Attending Neurohospitalist Addendum Patient seen and examined with APP/Resident. Agree with the history and physical as documented above. Agree with the plan as documented, which I helped formulate. I have edited the note above to reflect my full findings and recommendations. I have independently reviewed the chart, obtained history, review of systems and examined the patient.I have personally reviewed pertinent head/neck/spine imaging (CT/MRI). Please feel free to call with any questions.  -- Bing Neighbors, MD [facility] [phone]  If 7pm- 7am, please page neurology on call as listed in AMION.

## 2023-05-30 NOTE — Procedures (Signed)
DBS Programming was performed.    Manufacturer of DBS device: [employer], bluetooth  Total time spent programming was 10 minutes.  Device was confirmed to be on.  Soft start was confirmed to be on.  Impedences were checked and were within normal limits.  Battery was checked and was determined to be functioning normally and not near the end of life.  Final settings were as follows:

## 2023-05-31 DIAGNOSIS — M797 Fibromyalgia: Secondary | ICD-10-CM | POA: Diagnosis present

## 2023-05-31 DIAGNOSIS — Z882 Allergy status to sulfonamides status: Secondary | ICD-10-CM | POA: Diagnosis not present

## 2023-05-31 DIAGNOSIS — K219 Gastro-esophageal reflux disease without esophagitis: Secondary | ICD-10-CM | POA: Diagnosis present

## 2023-05-31 DIAGNOSIS — I951 Orthostatic hypotension: Secondary | ICD-10-CM

## 2023-05-31 DIAGNOSIS — Z79899 Other long term (current) drug therapy: Secondary | ICD-10-CM | POA: Diagnosis not present

## 2023-05-31 DIAGNOSIS — F411 Generalized anxiety disorder: Secondary | ICD-10-CM

## 2023-05-31 DIAGNOSIS — G903 Multi-system degeneration of the autonomic nervous system: Secondary | ICD-10-CM | POA: Diagnosis present

## 2023-05-31 DIAGNOSIS — G20A1 Parkinson's disease without dyskinesia, without mention of fluctuations: Secondary | ICD-10-CM

## 2023-05-31 DIAGNOSIS — H409 Unspecified glaucoma: Secondary | ICD-10-CM | POA: Diagnosis present

## 2023-05-31 DIAGNOSIS — M5136 Other intervertebral disc degeneration, lumbar region: Secondary | ICD-10-CM | POA: Diagnosis present

## 2023-05-31 DIAGNOSIS — Z888 Allergy status to other drugs, medicaments and biological substances status: Secondary | ICD-10-CM | POA: Diagnosis not present

## 2023-05-31 DIAGNOSIS — Z91041 Radiographic dye allergy status: Secondary | ICD-10-CM | POA: Diagnosis not present

## 2023-05-31 DIAGNOSIS — R55 Syncope and collapse: Secondary | ICD-10-CM | POA: Diagnosis not present

## 2023-05-31 DIAGNOSIS — I2510 Atherosclerotic heart disease of native coronary artery without angina pectoris: Secondary | ICD-10-CM

## 2023-05-31 DIAGNOSIS — R32 Unspecified urinary incontinence: Secondary | ICD-10-CM | POA: Diagnosis not present

## 2023-05-31 DIAGNOSIS — I429 Cardiomyopathy, unspecified: Secondary | ICD-10-CM | POA: Diagnosis present

## 2023-05-31 DIAGNOSIS — Z881 Allergy status to other antibiotic agents status: Secondary | ICD-10-CM | POA: Diagnosis not present

## 2023-05-31 DIAGNOSIS — I5032 Chronic diastolic (congestive) heart failure: Secondary | ICD-10-CM | POA: Diagnosis present

## 2023-05-31 DIAGNOSIS — M810 Age-related osteoporosis without current pathological fracture: Secondary | ICD-10-CM | POA: Diagnosis present

## 2023-05-31 DIAGNOSIS — E785 Hyperlipidemia, unspecified: Secondary | ICD-10-CM | POA: Diagnosis present

## 2023-05-31 DIAGNOSIS — F0670 Mild neurocognitive disorder due to known physiological condition without behavioral disturbance: Secondary | ICD-10-CM

## 2023-05-31 DIAGNOSIS — G8929 Other chronic pain: Secondary | ICD-10-CM | POA: Diagnosis present

## 2023-05-31 DIAGNOSIS — Z885 Allergy status to narcotic agent status: Secondary | ICD-10-CM | POA: Diagnosis not present

## 2023-05-31 DIAGNOSIS — G20B1 Parkinson's disease with dyskinesia, without mention of fluctuations: Secondary | ICD-10-CM

## 2023-05-31 DIAGNOSIS — D649 Anemia, unspecified: Secondary | ICD-10-CM | POA: Diagnosis present

## 2023-05-31 DIAGNOSIS — E860 Dehydration: Secondary | ICD-10-CM | POA: Diagnosis present

## 2023-05-31 DIAGNOSIS — F0283 Dementia in other diseases classified elsewhere, unspecified severity, with mood disturbance: Secondary | ICD-10-CM | POA: Diagnosis present

## 2023-05-31 DIAGNOSIS — R569 Unspecified convulsions: Secondary | ICD-10-CM | POA: Diagnosis not present

## 2023-05-31 DIAGNOSIS — F0284 Dementia in other diseases classified elsewhere, unspecified severity, with anxiety: Secondary | ICD-10-CM | POA: Diagnosis present

## 2023-05-31 LAB — CBC
HCT: 32.7 % — ABNORMAL LOW (ref 36.0–46.0)
Hemoglobin: 10.7 g/dL — ABNORMAL LOW (ref 12.0–15.0)
MCH: 28.5 pg (ref 26.0–34.0)
MCHC: 32.7 g/dL (ref 30.0–36.0)
MCV: 87 fL (ref 80.0–100.0)
Platelets: 232 10*3/uL (ref 150–400)
RBC: 3.76 MIL/uL — ABNORMAL LOW (ref 3.87–5.11)
RDW: 13.4 % (ref 11.5–15.5)
WBC: 5.6 10*3/uL (ref 4.0–10.5)
nRBC: 0 % (ref 0.0–0.2)

## 2023-05-31 LAB — COMPREHENSIVE METABOLIC PANEL
ALT: 5 U/L (ref 0–44)
AST: 14 U/L — ABNORMAL LOW (ref 15–41)
Albumin: 3.5 g/dL (ref 3.5–5.0)
Alkaline Phosphatase: 63 U/L (ref 38–126)
Anion gap: 8 (ref 5–15)
BUN: 15 mg/dL (ref 8–23)
CO2: 28 mmol/L (ref 22–32)
Calcium: 10 mg/dL (ref 8.9–10.3)
Chloride: 102 mmol/L (ref 98–111)
Creatinine, Ser: 0.78 mg/dL (ref 0.44–1.00)
GFR, Estimated: >60 mL/min (ref >60)
Glucose, Bld: 107 mg/dL — ABNORMAL HIGH (ref 70–99)
Potassium: 3.4 mmol/L — ABNORMAL LOW (ref 3.5–5.1)
Sodium: 138 mmol/L (ref 135–145)
Total Bilirubin: 0.7 mg/dL (ref 0.3–1.2)
Total Protein: 6.1 g/dL — ABNORMAL LOW (ref 6.5–8.1)

## 2023-05-31 LAB — GLUCOSE, CAPILLARY: Glucose-Capillary: 110 mg/dL — ABNORMAL HIGH (ref 70–99)

## 2023-05-31 MED ORDER — CLONAZEPAM 0.5 MG PO TABS
0.5000 mg | ORAL_TABLET | Freq: Every day | ORAL | Status: DC
  Administered 2023-05-31 – 2023-06-01 (×2): 0.5 mg via ORAL
  Filled 2023-05-31 (×2): qty 1

## 2023-05-31 MED ORDER — FLUDROCORTISONE ACETATE 0.1 MG PO TABS
100.0000 ug | ORAL_TABLET | Freq: Every day | ORAL | Status: DC
  Administered 2023-05-31 – 2023-06-02 (×3): 100 ug via ORAL
  Filled 2023-05-31 (×3): qty 1

## 2023-05-31 MED ORDER — POTASSIUM CHLORIDE CRYS ER 20 MEQ PO TBCR
60.0000 meq | EXTENDED_RELEASE_TABLET | Freq: Once | ORAL | Status: AC
  Administered 2023-05-31: 60 meq via ORAL
  Filled 2023-05-31: qty 3

## 2023-05-31 MED ORDER — LATANOPROST 0.005 % OP SOLN
1.0000 [drp] | Freq: Every day | OPHTHALMIC | Status: DC
  Administered 2023-05-31 – 2023-06-01 (×2): 1 [drp] via OPHTHALMIC
  Filled 2023-05-31: qty 2.5

## 2023-05-31 NOTE — Progress Notes (Signed)
Neurology Progress Note   S:// Patient is sitting up in bed in no apparent distress connected to LTM.  No family at the bedside.  Patient states she did not have any events that she is aware of last night is feeling better Logical exam is unchanged  O:// Current vital signs: BP 113/65 (BP Location: Right Arm)   Pulse 70   Temp 97.9 F (36.6 C) (Oral)   Resp 18   Ht 5\' 6"  (1.676 m)   Wt 70.1 kg   SpO2 97%   BMI 24.94 kg/m  Vital signs in last 24 hours: Temp:  [97.5 F (36.4 C)-97.9 F (36.6 C)] 97.9 F (36.6 C) (07/04 0724) Pulse Rate:  [66-78] 70 (07/04 0724) Resp:  [11-18] 18 (07/04 0724) BP: (104-160)/(54-107) 113/65 (07/04 0724) SpO2:  [95 %-99 %] 97 % (07/04 0724) Weight:  [69.6 kg-70.8 kg] 70.1 kg (07/04 0500)  GENERAL: Awake, alert in NAD HEENT: - Normocephalic and atraumatic, dry mm LUNGS - Clear to auscultation bilaterally with no wheezes CV - S1S2 RRR, no m/r/g, equal pulses bilaterally. ABDOMEN - Soft, nontender, nondistended with normoactive BS Ext: warm, well perfused, intact peripheral pulses, no edema NEURO:  Mental Status: AA&Ox4 Language: speech is hypophonic.  Naming, repetition, fluency, and comprehension intact. Cranial Nerves: PERRL  EOMI, visual fields full, chronic left facial asymmetry, facial sensation intact, hearing intact, tongue/uvula/soft palate midline, normal sternocleidomastoid and trapezius muscle strength. No evidence of tongue atrophy or fibrillations Motor: 4/5 in all 4 extremities Tone: is normal and bulk is normal Sensation- Intact to light touch bilaterally Coordination: FTN intact bilaterally, no ataxia in BLE. Gait- deferred  Medications  Current Facility-Administered Medications:    acetaminophen (TYLENOL) tablet 650 mg, 650 mg, Oral, Q6H PRN, 650 mg at 05/31/23 0029 **OR** acetaminophen (TYLENOL) suppository 650 mg, 650 mg, Rectal, Q6H PRN, Synetta Fail, MD   carbidopa-levodopa (SINEMET IR) 25-100 MG per tablet  immediate release 2 tablet, 2 tablet, Oral, BID, Synetta Fail, MD, 2 tablet at 05/30/23 2121   clonazePAM (KLONOPIN) tablet 0.5 mg, 0.5 mg, Oral, QHS, Gonfa, Taye T, MD   enoxaparin (LOVENOX) injection 40 mg, 40 mg, Subcutaneous, Q24H, Synetta Fail, MD, 40 mg at 05/30/23 2121   fludrocortisone (FLORINEF) tablet 100 mcg, 100 mcg, Oral, Daily, Gonfa, Taye T, MD   latanoprost (XALATAN) 0.005 % ophthalmic solution 1 drop, 1 drop, Both Eyes, QHS, Gonfa, Taye T, MD   nortriptyline (PAMELOR) capsule 10 mg, 10 mg, Oral, QHS, Synetta Fail, MD, 10 mg at 05/30/23 2121   polyethylene glycol (MIRALAX / GLYCOLAX) packet 17 g, 17 g, Oral, Daily PRN, Synetta Fail, MD   polyvinyl alcohol (LIQUIFILM TEARS) 1.4 % ophthalmic solution 1 drop, 1 drop, Both Eyes, Daily PRN, Synetta Fail, MD   potassium chloride SA (KLOR-CON M) CR tablet 60 mEq, 60 mEq, Oral, Once, Gonfa, Taye T, MD   sertraline (ZOLOFT) tablet 100 mg, 100 mg, Oral, Daily, Beola Cord B, MD   sodium chloride flush (NS) 0.9 % injection 3 mL, 3 mL, Intravenous, Q12H, Synetta Fail, MD, 3 mL at 05/30/23 2126   traMADol (ULTRAM) tablet 50 mg, 50 mg, Oral, Q6H PRN, Synetta Fail, MD  Labs CBC    Component Value Date/Time   WBC 5.6 05/31/2023 0236   RBC 3.76 (L) 05/31/2023 0236   HGB 10.7 (L) 05/31/2023 0236   HCT 32.7 (L) 05/31/2023 0236   PLT 232 05/31/2023 0236   MCV 87.0 05/31/2023 0236  MCH 28.5 05/31/2023 0236   MCHC 32.7 05/31/2023 0236   RDW 13.4 05/31/2023 0236   LYMPHSABS 2.1 01/03/2023 0846   MONOABS 0.3 01/03/2023 0846   EOSABS 0.1 01/03/2023 0846   BASOSABS 0.1 01/03/2023 0846    CMP     Component Value Date/Time   NA 138 05/31/2023 0236   K 3.4 (L) 05/31/2023 0236   CL 102 05/31/2023 0236   CO2 28 05/31/2023 0236   GLUCOSE 107 (H) 05/31/2023 0236   GLUCOSE 93 09/05/2006 1528   BUN 15 05/31/2023 0236   CREATININE 0.78 05/31/2023 0236   CREATININE 0.87 10/08/2019 0921    CALCIUM 10.0 05/31/2023 0236   PROT 6.1 (L) 05/31/2023 0236   ALBUMIN 3.5 05/31/2023 0236   AST 14 (L) 05/31/2023 0236   ALT 5 05/31/2023 0236   ALKPHOS 63 05/31/2023 0236   BILITOT 0.7 05/31/2023 0236   GFRNONAA >60 05/31/2023 0236   GFRAA >60 11/24/2019 1135    Lipid Panel     Component Value Date/Time   CHOL 227 (H) 01/03/2023 0846   TRIG 109.0 01/03/2023 0846   HDL 58.10 01/03/2023 0846   CHOLHDL 4 01/03/2023 0846   VLDL 21.8 01/03/2023 0846   LDLCALC 147 (H) 01/03/2023 0846   LDLDIRECT 105.5 07/20/2010 1146    Lab Results  Component Value Date   HGBA1C 5.9 04/26/2022     Imaging I have reviewed images in epic and the results pertinent to this consultation are:  CT-scan of the brain-acute process  LTM 7/4: This study is within normal limits. The excessive beta activity seen in the background is most likely due to the effect of benzodiazepine and is a benign EEG pattern. No seizures or epileptiform discharges were seen throughout the recording.   Assessment:  77 y.o. female with past medical history of CAD, anxiety and depression,Parkinson's disease s/p DBS (reprogrammed today at neurology office by Dr. Arbutus Leas), CHF, hyperlipidemia, neurogenic orthostatic hypotension, dizziness, vitamin D deficiency, fibromyalgia, GERD, glaucoma presents to [facility], ED for evaluation of multiple syncopal episodes at her neurology appointment 7/3.  Impression: Syncope versus seizure   Recommendations: -Seizure precautions -LTM to evaluate for seizures - no spells captured, continue EEG -Zio patch on, interrogate Zio patch and see if there were any concerning arrhythmias during these events or just prior to events -Will hold off on starting AEDs until LTM has been completed - continue tele - Orthostatic vitals -Neurology will continue to follow  Gevena Mart DNP, ACNPC-AG  [facility]

## 2023-05-31 NOTE — Evaluation (Signed)
Physical Therapy Evaluation Patient Details Name: [name] MRN: [id number] DOB: 1946/05/27 Today's Date: 05/31/2023  History of Present Illness  77 yo female admitted 7/3 with syncope. PMhx: syncope, orthostasis, Parkinson's dz, neurocognitive decline, HLD, GERD, CAD, anemia, depression, anxiety, fibromyalgia, DDD s/p L4-5 decompression  Clinical Impression  Pt pleasant with son present and able to state family can provide assist at D/C. Pt with posterior bias in standing with assist for balance and EEG limiting mobility. Pt with decreased balance, gait and function who will benefit from acute therapy to maximize mobility, safety and independence to decrease burden of care. No DME needs.   Orthostatic BPs  Supine 121/63, HR 80  Sitting 123/70, HR 86     Standing 103/66, HR 95  Standing after 3 min 107/75, HR 91         Assistance Recommended at Discharge Frequent or constant Supervision/Assistance  If plan is discharge home, recommend the following:  Can travel by private vehicle  A little help with walking and/or transfers;A little help with bathing/dressing/bathroom;Assistance with cooking/housework;Assist for transportation        Equipment Recommendations None recommended by PT  Recommendations for Other Services       Functional Status Assessment Patient has had a recent decline in their functional status and demonstrates the ability to make significant improvements in function in a reasonable and predictable amount of time.     Precautions / Restrictions Precautions Precautions: Fall;Other (comment) Precaution Comments: LTM EEG      Mobility  Bed Mobility Overal bed mobility: Needs Assistance Bed Mobility: Supine to Sit     Supine to sit: Min guard     General bed mobility comments: increased effort and momentum to rise to sitting    Transfers Overall transfer level: Needs assistance   Transfers: Sit to/from Stand, Bed to chair/wheelchair/BSC Sit  to Stand: Min assist           General transfer comment: min assist to stabilize in standing with single UE support, pivot to chair with support and min assist for posterior bias with static standing    Ambulation/Gait Ambulation/Gait assistance: Min assist Gait Distance (Feet): 10 Feet Assistive device: 1 person hand held assist Gait Pattern/deviations: Step-through pattern, Decreased stride length   Gait velocity interpretation: <1.8 ft/sec, indicate of risk for recurrent falls   General Gait Details: pt reliant on RUE support for limited gait within line of EEG with assist for balance, pt denied attempting further trials with RW obtained and encouraged for further mobility  Stairs            Wheelchair Mobility     Tilt Bed    Modified Rankin (Stroke Patients Only)       Balance Overall balance assessment: Needs assistance   Sitting balance-Leahy Scale: Fair   Postural control: Posterior lean Standing balance support: Single extremity supported Standing balance-Leahy Scale: Poor Standing balance comment: posterior bias                             Pertinent Vitals/Pain Pain Assessment Pain Assessment: No/denies pain    Home Living Family/patient expects to be discharged to:: Private residence Living Arrangements: Children Available Help at Discharge: Family;Available 24 hours/day Type of Home: House Home Access: Level entry       Home Layout: Two level;Able to live on main level with bedroom/bathroom Home Equipment: Rollator (4 wheels);Cane - single point;BSC/3in1;Shower seat;Wheelchair - Careers adviser (comment);Standard Walker (lift chair)  Prior Function Prior Level of Function : Independent/Modified Independent             Mobility Comments: uses cane or rollator intermittently ADLs Comments: can drive short distances, performing ADLs, family does the homemaking     Hand Dominance        Extremity/Trunk Assessment    Upper Extremity Assessment Upper Extremity Assessment: Generalized weakness    Lower Extremity Assessment Lower Extremity Assessment: Generalized weakness    Cervical / Trunk Assessment Cervical / Trunk Assessment: Normal  Communication   Communication: No difficulties (soft spoken)  Cognition Arousal/Alertness: Awake/alert Behavior During Therapy: WFL for tasks assessed/performed Overall Cognitive Status: Within Functional Limits for tasks assessed                                          General Comments      Exercises     Assessment/Plan    PT Assessment Patient needs continued PT services  PT Problem List Decreased activity tolerance;Decreased balance;Decreased mobility;Decreased knowledge of use of DME       PT Treatment Interventions DME instruction;Gait training;Balance training;Functional mobility training;Therapeutic activities;Patient/family education    PT Goals (Current goals can be found in the Care Plan section)  Acute Rehab PT Goals Patient Stated Goal: return home, feel better PT Goal Formulation: With patient/family Time For Goal Achievement: 06/14/23 Potential to Achieve Goals: Good    Frequency Min 3X/week     Co-evaluation               AM-PAC PT "6 Clicks" Mobility  Outcome Measure Help needed turning from your back to your side while in a flat bed without using bedrails?: A Little Help needed moving from lying on your back to sitting on the side of a flat bed without using bedrails?: A Little Help needed moving to and from a bed to a chair (including a wheelchair)?: A Little Help needed standing up from a chair using your arms (e.g., wheelchair or bedside chair)?: A Little Help needed to walk in hospital room?: A Little Help needed climbing 3-5 steps with a railing? : A Lot 6 Click Score: 17    End of Session   Activity Tolerance: Patient tolerated treatment well Patient left: in chair;with call bell/phone within  reach;with chair alarm set;with family/visitor present Nurse Communication: Mobility status PT Visit Diagnosis: Other abnormalities of gait and mobility (R26.89);Difficulty in walking, not elsewhere classified (R26.2)    Time: 1610-9604 PT Time Calculation (min) (ACUTE ONLY): 25 min   Charges:   PT Evaluation $PT Eval Moderate Complexity: 1 Mod PT Treatments $Therapeutic Activity: 8-22 mins PT General Charges $$ ACUTE PT VISIT: 1 Visit         Merryl Hacker, PT Acute Rehabilitation Services Office: [phone]   Enedina Finner Rande Roylance 05/31/2023, 11:56 AM

## 2023-05-31 NOTE — Procedures (Addendum)
Patient Name: [name]  MRN: [id number]  Epilepsy Attending: Charlsie Quest  Referring Physician/Provider: Mathews Argyle, NP  Duration: 05/30/2023 1435 to 05/31/2023 1435  Patient history: 77 yo F patient with multiple syncopal episodes with LOC, while in the ED had a blank stare to the left. EEG to evaluate for seizure.  Level of alertness: Awake, asleep  AEDs during EEG study: Clonazepam  Technical aspects: This EEG study was done with scalp electrodes positioned according to the 10-20 International system of electrode placement. Electrical activity was reviewed with band pass filter of 1-70Hz , sensitivity of 7 uV/mm, display speed of 17mm/sec with a 60Hz  notched filter applied as appropriate. EEG data were recorded continuously and digitally stored.  Video monitoring was available and reviewed as appropriate.  Description: The posterior dominant rhythm consists of 9 Hz activity of moderate voltage (25-35 uV) seen predominantly in posterior head regions, symmetric and reactive to eye opening and eye closing. Sleep was characterized by vertex waves, sleep spindles (12 to 14 Hz), maximal frontocentral region. There is an excessive amount of 15 to 18 Hz beta activity with irregular morphology distributed symmetrically and diffusely. Hyperventilation and photic stimulation were not performed.    ABNORMALITY - Excessive beta, generalized   IMPRESSION: This study is within normal limits. The excessive beta activity seen in the background is most likely due to the effect of benzodiazepine and is a benign EEG pattern. No seizures or epileptiform discharges were seen throughout the recording.  A normal interictal EEG does not exclude the diagnosis of epilepsy.  Nikai Quest Annabelle Harman

## 2023-05-31 NOTE — Progress Notes (Signed)
PROGRESS NOTE  [name] ZOX:[id number] DOB: 04-Feb-1946   PCP: Eustaquio Boyden, MD  Patient is from: Home  DOA: 05/30/2023 LOS: 0  Chief complaints Chief Complaint  Patient presents with   Loss of Consciousness     Brief Narrative / Interim history: 77 year old F with PMH of Parkinson's disease s/p DBS, orthostatic hypotension, diastolic CHF, anxiety, depression, fibromyalgia and degenerative disc disease sent to ED from neurology office with what looks like a syncopal episode versus seizure.  Patient was at neurology office and had had DBS reprogrammed by a neurologist.  She started feeling unwell and slumped over while sitting down.  Cardiology office note, stable vitals.  She was placed in.  And had multiple syncopal episodes but was easily aroused by sternal rub.  While in ED, she had additional episode where she was staring off to the left and was not responding.  She was slightly hypertensive.  Labs without significant finding other than slightly elevated calcium.  UA does not suggest UTI.  CXR and CT head without acute finding.  Neurology consulted and admitted for further evaluation with LTM EEG.  Patient was recently hospitalized at [facility] for syncopal episode and had extensive workup including TTE, carotid US and spot EEG that were unrevealing.  She was seen by PCP on 6/28.  She had Zio patch placed on 7/1.  Subjective: Seen and examined earlier this morning.  No major events overnight of this morning.  Sitting on bedside chair with LTM EEG monitoring on.  Patient's son at bedside.  No further syncopal or seizure-like activity.  Has no complaints.  She denies dizziness, focal weakness, numbness or tingling.  Objective: Vitals:   05/30/23 2007 05/31/23 0451 05/31/23 0500 05/31/23 0724  BP: 118/69 (!) 104/54  113/65  Pulse: 73 66  70  Resp: 18 18  18   Temp: 97.9 F (36.6 C) 97.6 F (36.4 C)  97.9 F (36.6 C)  TempSrc: Oral Oral  Oral  SpO2: 97% 95%  97%  Weight:   70.1  kg   Height:        Examination:  GENERAL: No apparent distress.  Nontoxic. HEENT: MMM.  Vision and hearing grossly intact.  NECK: Supple.  No apparent JVD.  RESP:  No IWOB.  Fair aeration bilaterally. CVS:  RRR. Heart sounds normal.  ABD/GI/GU: BS+. Abd soft, NTND.  MSK/EXT:  Moves extremities. No apparent deformity. No edema.  SKIN: no apparent skin lesion or wound NEURO: Awake, alert and oriented fairly.  No apparent focal neuro deficit. PSYCH: Calm. Normal affect.   Procedures:  None  Microbiology summarized: None  Assessment and plan: Principal Problem:   Syncope and collapse Active Problems:   Anemia   Orthostatic hypotension   GERD   Fibromyalgia   CAD (coronary artery disease)   HLD (hyperlipidemia)   DDD (degenerative disc disease), lumbar   MDD (major depressive disorder), single episode, moderate (HCC)   Parkinson's disease   GAD (generalized anxiety disorder)   Mild neurocognitive disorder due to Parkinson's disease   Urinary incontinence  Syncope and collapse-had multiple episodes while at the neuro office Orthostatic hypotension: SBP 123>> 103 from sitting to standing but improved to 107 after standing. Seizure-like activity-had an episode of staring to the left while in ED -Patient had extensive syncope workup including TTE, carotid US and labs while at [facility] without significant finding -LTM EEG negative for seizure here. -She has history of Parkinson disease, and neurogenic orthostatic hypotension which might be contributing -Orthostatic vitals  positive with  -Patient is on chronic pain, nortriptyline and tramadol which could exacerbate symptoms. -Will reach out to cardiology for Zio patch interrogation -TED hose -Follow further neuro recommendations -Continue home Florinef -PT/OT   Parkinson's disease with neurocognitive decline s/p DBS that was reprogrammed by her neurologist on the day of admission -Continue home carbidopa-levodopa    Anxiety, depression and nortriptyline: On Klonopin, Zoloft and nortriptyline.  Could contribute to his syncope. -Will continue home meds for now   Degenerative disc disease -Continue home tramadol but could lower the threshold for seizure and also contribute to altered taste.   He is of CAD/HLD: Stable.  Does not seem to be on medications.   Normocytic anemia: Stable Recent Labs    01/03/23 0846 05/30/23 1012 05/31/23 0236  HGB 12.2 11.9* 10.7*    Body mass index is 24.94 kg/m.           DVT prophylaxis:  Place TED hose Start: 05/31/23 1229 enoxaparin (LOVENOX) injection 40 mg Start: 05/30/23 2200  Code Status: Full code Family Communication: Updated patient's son at bedside Level of care: Telemetry Medical Status is: Observation The patient will require care spanning > 2 midnights and should be moved to inpatient because: Orthostatic hypotension, possible syncope/seizure-like activity   Final disposition: Home with home health. Consultants:  Neurology Cardiology  35 minutes with more than 50% spent in reviewing records, counseling patient/family and coordinating care.   Sch Meds:  Scheduled Meds:  carbidopa-levodopa  2 tablet Oral BID   clonazePAM  0.5 mg Oral QHS   enoxaparin (LOVENOX) injection  40 mg Subcutaneous Q24H   fludrocortisone  100 mcg Oral Daily   latanoprost  1 drop Both Eyes QHS   nortriptyline  10 mg Oral QHS   sertraline  100 mg Oral Daily   sodium chloride flush  3 mL Intravenous Q12H   Continuous Infusions: PRN Meds:.acetaminophen **OR** acetaminophen, polyethylene glycol, polyvinyl alcohol, traMADol  Antimicrobials: Anti-infectives (From admission, onward)    None        I have personally reviewed the following labs and images: CBC: Recent Labs  Lab 05/30/23 1012 05/31/23 0236  WBC 5.4 5.6  HGB 11.9* 10.7*  HCT 37.6 32.7*  MCV 89.5 87.0  PLT 259 232   BMP &GFR Recent Labs  Lab 05/30/23 1012 05/31/23 0236  NA  136 138  K 3.8 3.4*  CL 99 102  CO2 26 28  GLUCOSE 111* 107*  BUN 17 15  CREATININE 0.97 0.78  CALCIUM 10.8* 10.0   Estimated Creatinine Clearance: 57.8 mL/min (by C-G formula based on SCr of 0.78 mg/dL). Liver & Pancreas: Recent Labs  Lab 05/31/23 0236  AST 14*  ALT 5  ALKPHOS 63  BILITOT 0.7  PROT 6.1*  ALBUMIN 3.5   No results for input(s): "LIPASE", "AMYLASE" in the last 168 hours. No results for input(s): "AMMONIA" in the last 168 hours. Diabetic: No results for input(s): "HGBA1C" in the last 72 hours. Recent Labs  Lab 05/31/23 0448  GLUCAP 110*   Cardiac Enzymes: No results for input(s): "CKTOTAL", "CKMB", "CKMBINDEX", "TROPONINI" in the last 168 hours. No results for input(s): "PROBNP" in the last 8760 hours. Coagulation Profile: No results for input(s): "INR", "PROTIME" in the last 168 hours. Thyroid Function Tests: No results for input(s): "TSH", "T4TOTAL", "FREET4", "T3FREE", "THYROIDAB" in the last 72 hours. Lipid Profile: No results for input(s): "CHOL", "HDL", "LDLCALC", "TRIG", "CHOLHDL", "LDLDIRECT" in the last 72 hours. Anemia Panel: No results for input(s): "VITAMINB12", "FOLATE", "FERRITIN", "  TIBC", "IRON", "RETICCTPCT" in the last 72 hours. Urine analysis:    Component Value Date/Time   COLORURINE YELLOW 05/30/2023 1013   APPEARANCEUR CLEAR 05/30/2023 1013   LABSPEC 1.009 05/30/2023 1013   PHURINE 7.0 05/30/2023 1013   GLUCOSEU NEGATIVE 05/30/2023 1013   HGBUR NEGATIVE 05/30/2023 1013   HGBUR trace-lysed 09/02/2010 0954   BILIRUBINUR NEGATIVE 05/30/2023 1013   BILIRUBINUR negative 02/20/2023 0958   KETONESUR NEGATIVE 05/30/2023 1013   PROTEINUR NEGATIVE 05/30/2023 1013   UROBILINOGEN 0.2 02/20/2023 0958   UROBILINOGEN 0.2 09/04/2010 1342   NITRITE NEGATIVE 05/30/2023 1013   LEUKOCYTESUR SMALL (A) 05/30/2023 1013   Sepsis Labs: Invalid input(s): "PROCALCITONIN", "LACTICIDVEN"  Microbiology: No results found for this or any previous  visit (from the past 240 hour(s)).  Radiology Studies: Overnight EEG with video  Result Date: 05/31/2023 Charlsie Quest, MD     05/31/2023  9:23 AM Patient Name: RACHELANN MADRAZO MRN: [id number] Epilepsy Attending: Charlsie Quest Referring Physician/Provider: Mathews Argyle, NP Duration: 05/30/2023 1435 to 05/31/2023 0920 Patient history: 77 yo F patient with multiple syncopal episodes with LOC, while in the ED had a blank stare to the left. EEG to evaluate for seizure. Level of alertness: Awake, asleep AEDs during EEG study: Clonazepam Technical aspects: This EEG study was done with scalp electrodes positioned according to the 10-20 International system of electrode placement. Electrical activity was reviewed with band pass filter of 1-70Hz , sensitivity of 7 uV/mm, display speed of 72mm/sec with a 60Hz  notched filter applied as appropriate. EEG data were recorded continuously and digitally stored.  Video monitoring was available and reviewed as appropriate. Description: The posterior dominant rhythm consists of 9 Hz activity of moderate voltage (25-35 uV) seen predominantly in posterior head regions, symmetric and reactive to eye opening and eye closing. Sleep was characterized by vertex waves, sleep spindles (12 to 14 Hz), maximal frontocentral region. There is an excessive amount of 15 to 18 Hz beta activity with irregular morphology distributed symmetrically and diffusely. Hyperventilation and photic stimulation were not performed.  ABNORMALITY - Excessive beta, generalized IMPRESSION: This study is within normal limits. The excessive beta activity seen in the background is most likely due to the effect of benzodiazepine and is a benign EEG pattern. No seizures or epileptiform discharges were seen throughout the recording. A normal interictal EEG does not exclude the diagnosis of epilepsy. Priyanka Annabelle Harman      Maya Arcand T. Preet Mangano [facility]  If 7PM-7AM, please contact  night-coverage [email or web address] 05/31/2023, 12:31 PM

## 2023-05-31 NOTE — Progress Notes (Signed)
Performed maintenance on leads.  All under 10.  No visible skin breakdown  

## 2023-06-01 ENCOUNTER — Ambulatory Visit: Payer: Self-pay

## 2023-06-01 DIAGNOSIS — F411 Generalized anxiety disorder: Secondary | ICD-10-CM | POA: Diagnosis not present

## 2023-06-01 DIAGNOSIS — I951 Orthostatic hypotension: Secondary | ICD-10-CM | POA: Diagnosis not present

## 2023-06-01 DIAGNOSIS — R55 Syncope and collapse: Secondary | ICD-10-CM | POA: Diagnosis not present

## 2023-06-01 DIAGNOSIS — I2510 Atherosclerotic heart disease of native coronary artery without angina pectoris: Secondary | ICD-10-CM | POA: Diagnosis not present

## 2023-06-01 DIAGNOSIS — M797 Fibromyalgia: Secondary | ICD-10-CM | POA: Diagnosis not present

## 2023-06-01 LAB — RENAL FUNCTION PANEL
Albumin: 3.8 g/dL (ref 3.5–5.0)
Anion gap: 9 (ref 5–15)
BUN: 15 mg/dL (ref 8–23)
CO2: 28 mmol/L (ref 22–32)
Calcium: 10 mg/dL (ref 8.9–10.3)
Chloride: 101 mmol/L (ref 98–111)
Creatinine, Ser: 0.79 mg/dL (ref 0.44–1.00)
GFR, Estimated: >60 mL/min (ref >60)
Glucose, Bld: 93 mg/dL (ref 70–99)
Phosphorus: 4.5 mg/dL (ref 2.5–4.6)
Potassium: 3.7 mmol/L (ref 3.5–5.1)
Sodium: 138 mmol/L (ref 135–145)

## 2023-06-01 LAB — MAGNESIUM: Magnesium: 1.6 mg/dL — ABNORMAL LOW (ref 1.7–2.4)

## 2023-06-01 LAB — CBC
HCT: 38.1 % (ref 36.0–46.0)
Hemoglobin: 11.8 g/dL — ABNORMAL LOW (ref 12.0–15.0)
MCH: 27.6 pg (ref 26.0–34.0)
MCHC: 31 g/dL (ref 30.0–36.0)
MCV: 89 fL (ref 80.0–100.0)
Platelets: 236 10*3/uL (ref 150–400)
RBC: 4.28 MIL/uL (ref 3.87–5.11)
RDW: 13.3 % (ref 11.5–15.5)
WBC: 5.4 10*3/uL (ref 4.0–10.5)
nRBC: 0 % (ref 0.0–0.2)

## 2023-06-01 LAB — GLUCOSE, CAPILLARY
Glucose-Capillary: 102 mg/dL — ABNORMAL HIGH (ref 70–99)
Glucose-Capillary: 124 mg/dL — ABNORMAL HIGH (ref 70–99)

## 2023-06-01 MED ORDER — POTASSIUM CHLORIDE CRYS ER 20 MEQ PO TBCR
40.0000 meq | EXTENDED_RELEASE_TABLET | Freq: Once | ORAL | Status: AC
  Administered 2023-06-01: 40 meq via ORAL
  Filled 2023-06-01: qty 2

## 2023-06-01 MED ORDER — MAGNESIUM SULFATE 2 GM/50ML IV SOLN
2.0000 g | Freq: Once | INTRAVENOUS | Status: AC
  Administered 2023-06-01: 2 g via INTRAVENOUS
  Filled 2023-06-01: qty 50

## 2023-06-01 NOTE — Chronic Care Management (AMB) (Signed)
   06/01/2023  APOLLONIA RADCLIFF September 07, 1946 [id number]   Reason for Encounter: Patient is not currently enrolled in the CCM program CCM enrollment status changed to "Previously enrolled"   Katha Cabal [facility] Health/Chronic Care Management [phone]

## 2023-06-01 NOTE — Progress Notes (Signed)
Neurology Progress Note   S:// No family at bedside. Patient sitting up in bed, no acute distress.   She describes the syncope as being for the past couple of weeks, both before and after her DBS was reprogrammed. She has only gotten OOB to chair a couple of times with assistance, has not walked with PT yet.   Per chart review, patient was previously on Northera for Neurogenic Orthostatic Hypotension, stopped this on her own and was placed on Florinef 0.1mg  daily. Patient denied missing any doses of her medicine. This has been continued inpatient.   Recent ED visit 7/23 for syncope: CT, EEG, EKG, cUS unremarkable. Thought to be d/t UTI and dehydration. On this visit, UA clear with only rare bacteria.   Spoke with EPS reqarding Zio Patch interrogation. Patient is wearing an XT monitor which has no active monitoring. In order to get the information from the monitor, we would need to be sent back to the company to be read.   O:// Current vital signs: BP 108/65 (BP Location: Left Arm)   Pulse 72   Temp 98.3 F (36.8 C) (Oral)   Resp 16   Ht 5\' 6"  (1.676 m)   Wt 70.7 kg   SpO2 94%   BMI 25.16 kg/m  Vital signs in last 24 hours: Temp:  [97.6 F (36.4 C)-98.3 F (36.8 C)] 98.3 F (36.8 C) (07/05 0500) Pulse Rate:  [72-86] 72 (07/05 0500) Resp:  [15-16] 16 (07/05 0500) BP: (108-121)/(61-78) 108/65 (07/05 0500) SpO2:  [94 %-96 %] 94 % (07/05 0500) Weight:  [70.7 kg] 70.7 kg (07/05 0550)  GENERAL: Awake, alert in NAD HEENT: - Normocephalic and atraumatic, dry mm. EEG leads in place. LUNGS - Unlabored, room air CV - S1S2 RRR, equal pulses bilaterally. ABDOMEN - Soft, nontender, nondistended with normoactive BS Ext: warm, well perfused, intact peripheral pulses, no edema  NEURO:  Mental Status: AA&Ox4 Language: speech is hypophonic.  Naming, repetition, fluency, and comprehension intact. Cranial Nerves: PERRL  EOMI, visual fields full, chronic left facial asymmetry, facial  sensation intact, hearing intact, tongue/uvula/soft palate midline, normal sternocleidomastoid and trapezius muscle strength. No evidence of tongue atrophy or fibrillations Motor: 4+/5 in all 4 extremities, but 4-/5 bilateral hip flexion.  Tone: is normal and bulk is normal Sensation- Intact to light touch bilaterally Coordination: FTN intact bilaterally, no ataxia in BLE. Gait- deferred  Medications  Current Facility-Administered Medications:    acetaminophen (TYLENOL) tablet 650 mg, 650 mg, Oral, Q6H PRN, 650 mg at 05/31/23 0029 **OR** acetaminophen (TYLENOL) suppository 650 mg, 650 mg, Rectal, Q6H PRN, Synetta Fail, MD   carbidopa-levodopa (SINEMET IR) 25-100 MG per tablet immediate release 2 tablet, 2 tablet, Oral, BID, Synetta Fail, MD, 2 tablet at 05/31/23 2037   clonazePAM (KLONOPIN) tablet 0.5 mg, 0.5 mg, Oral, QHS, Gonfa, Taye T, MD, 0.5 mg at 05/31/23 2037   enoxaparin (LOVENOX) injection 40 mg, 40 mg, Subcutaneous, Q24H, Synetta Fail, MD, 40 mg at 05/31/23 2037   fludrocortisone (FLORINEF) tablet 100 mcg, 100 mcg, Oral, Daily, Gonfa, Taye T, MD, 100 mcg at 05/31/23 1211   latanoprost (XALATAN) 0.005 % ophthalmic solution 1 drop, 1 drop, Both Eyes, QHS, Gonfa, Taye T, MD, 1 drop at 05/31/23 2038   nortriptyline (PAMELOR) capsule 10 mg, 10 mg, Oral, QHS, Synetta Fail, MD, 10 mg at 05/31/23 2037   polyethylene glycol (MIRALAX / GLYCOLAX) packet 17 g, 17 g, Oral, Daily PRN, Synetta Fail, MD   polyvinyl alcohol (LIQUIFILM  TEARS) 1.4 % ophthalmic solution 1 drop, 1 drop, Both Eyes, Daily PRN, Synetta Fail, MD   sertraline (ZOLOFT) tablet 100 mg, 100 mg, Oral, Daily, Beola Cord B, MD, 100 mg at 05/31/23 1211   sodium chloride flush (NS) 0.9 % injection 3 mL, 3 mL, Intravenous, Q12H, Synetta Fail, MD, 3 mL at 05/31/23 2042   traMADol (ULTRAM) tablet 50 mg, 50 mg, Oral, Q6H PRN, Synetta Fail, MD  Labs CBC    Component Value  Date/Time   WBC 5.6 05/31/2023 0236   RBC 3.76 (L) 05/31/2023 0236   HGB 10.7 (L) 05/31/2023 0236   HCT 32.7 (L) 05/31/2023 0236   PLT 232 05/31/2023 0236   MCV 87.0 05/31/2023 0236   MCH 28.5 05/31/2023 0236   MCHC 32.7 05/31/2023 0236   RDW 13.4 05/31/2023 0236   LYMPHSABS 2.1 01/03/2023 0846   MONOABS 0.3 01/03/2023 0846   EOSABS 0.1 01/03/2023 0846   BASOSABS 0.1 01/03/2023 0846    CMP     Component Value Date/Time   NA 138 05/31/2023 0236   K 3.4 (L) 05/31/2023 0236   CL 102 05/31/2023 0236   CO2 28 05/31/2023 0236   GLUCOSE 107 (H) 05/31/2023 0236   GLUCOSE 93 09/05/2006 1528   BUN 15 05/31/2023 0236   CREATININE 0.78 05/31/2023 0236   CREATININE 0.87 10/08/2019 0921   CALCIUM 10.0 05/31/2023 0236   PROT 6.1 (L) 05/31/2023 0236   ALBUMIN 3.5 05/31/2023 0236   AST 14 (L) 05/31/2023 0236   ALT 5 05/31/2023 0236   ALKPHOS 63 05/31/2023 0236   BILITOT 0.7 05/31/2023 0236   GFRNONAA >60 05/31/2023 0236   GFRAA >60 11/24/2019 1135    Lipid Panel     Component Value Date/Time   CHOL 227 (H) 01/03/2023 0846   TRIG 109.0 01/03/2023 0846   HDL 58.10 01/03/2023 0846   CHOLHDL 4 01/03/2023 0846   VLDL 21.8 01/03/2023 0846   LDLCALC 147 (H) 01/03/2023 0846   LDLDIRECT 105.5 07/20/2010 1146    Lab Results  Component Value Date   HGBA1C 5.9 04/26/2022     Imaging I have reviewed images in epic and the results pertinent to this consultation are:  CT-scan of the brain-acute process  LTM 7/4: This study is within normal limits. The excessive beta activity seen in the background is most likely due to the effect of benzodiazepine and is a benign EEG pattern. No seizures or epileptiform discharges were seen throughout the recording.   Assessment:  77 y.o. female with past medical history of CAD, anxiety and depression,Parkinson's disease s/p DBS (reprogrammed today at neurology office by Dr. Arbutus Leas), CHF, hyperlipidemia, neurogenic orthostatic hypotension, dizziness,  vitamin D deficiency, fibromyalgia, GERD, glaucoma presents to [facility], ED for evaluation of multiple syncopal episodes at her neurology appointment 7/3. 48 hrs LTM showed no epileptiform abnl, no spells captured.  Impression: Syncope versus seizure   Recommendations: - LTM is WNL. Will d/c now that we have had 48hrs of monitoring with no seizure activity. This will also allow patient to be OOB, ambulate with PT and have orthostatic vitals completed.  - continue tele - Orthostatic vitals - consider fluid bolus as this helped prior episode  Neurology will be available as needed. Please recall with any further questions/concerns. Thank you for this consult.    Pt seen by Neuro NP/APP and later by MD. Note/plan to be edited by MD as needed.    Lynnae January, DNP, AGACNP-BC [facility]  Please use AMION for contact information & EPIC for messaging.   Attending Neurohospitalist Addendum Patient seen and examined with APP/Resident. Agree with the history and physical as documented above. Agree with the plan as documented, which I helped formulate. I have edited the note above to reflect my full findings and recommendations. I have independently reviewed the chart, obtained history, review of systems and examined the patient.I have personally reviewed pertinent head/neck/spine imaging (CT/MRI). Please feel free to call with any questions.  -- Bing Neighbors, MD [facility] [phone]  If 7pm- 7am, please page neurology on call as listed in AMION.

## 2023-06-01 NOTE — Procedures (Addendum)
Patient Name: [name]  MRN: [id number]  Epilepsy Attending: Charlsie Quest  Referring Physician/Provider: Mathews Argyle, NP  Duration: 05/31/2023 1435 to 06/01/2023 1019   Patient history: 77 yo F patient with multiple syncopal episodes with LOC, while in the ED had a blank stare to the left. EEG to evaluate for seizure.   Level of alertness: Awake, asleep   AEDs during EEG study: Clonazepam   Technical aspects: This EEG study was done with scalp electrodes positioned according to the 10-20 International system of electrode placement. Electrical activity was reviewed with band pass filter of 1-70Hz , sensitivity of 7 uV/mm, display speed of 85mm/sec with a 60Hz  notched filter applied as appropriate. EEG data were recorded continuously and digitally stored.  Video monitoring was available and reviewed as appropriate.   Description: The posterior dominant rhythm consists of 9 Hz activity of moderate voltage (25-35 uV) seen predominantly in posterior head regions, symmetric and reactive to eye opening and eye closing. Sleep was characterized by vertex waves, sleep spindles (12 to 14 Hz), maximal frontocentral region. There is an excessive amount of 15 to 18 Hz beta activity with irregular morphology distributed symmetrically and diffusely. Hyperventilation and photic stimulation were not performed.     ABNORMALITY - Excessive beta, generalized    IMPRESSION: This study is within normal limits. The excessive beta activity seen in the background is most likely due to the effect of benzodiazepine and is a benign EEG pattern. No seizures or epileptiform discharges were seen throughout the recording.   A normal interictal EEG does not exclude the diagnosis of epilepsy.   Nelton Amsden Annabelle Harman

## 2023-06-01 NOTE — Progress Notes (Signed)
EEG TECh maint. PZ, FZ, and P8

## 2023-06-01 NOTE — Progress Notes (Signed)
PROGRESS NOTE  [name] ZOX:[id number] DOB: 12-06-1945   PCP: Eustaquio Boyden, MD  Patient is from: Home  DOA: 05/30/2023 LOS: 1  Chief complaints Chief Complaint  Patient presents with   Loss of Consciousness     Brief Narrative / Interim history: 77 year old F with PMH of Parkinson's disease s/p DBS, orthostatic hypotension, diastolic CHF, anxiety, depression, fibromyalgia and degenerative disc disease sent to ED from neurology office with what looks like a syncopal episode versus seizure.  Patient was at neurology office and had had DBS reprogrammed by a neurologist.  She started feeling unwell and slumped over while sitting down.  Cardiology office note, stable vitals.  She was placed in.  And had multiple syncopal episodes but was easily aroused by sternal rub.  While in ED, she had additional episode where she was staring off to the left and was not responding.  She was slightly hypertensive.  Labs without significant finding other than slightly elevated calcium.  UA does not suggest UTI.  CXR and CT head without acute finding.  Neurology consulted and admitted for further evaluation with LTM EEG.  Patient was recently hospitalized at [facility] for syncopal episode and had extensive workup including TTE, carotid US and spot EEG that were unrevealing.  She was seen by PCP on 6/28.  She had Zio patch placed on 7/1.  LTM EEG negative for seizure or epilepsy.  Telemetry without significant finding.  No further events since hospitalization.  Unfortunately, he has Zio patch cannot be interrogated until study is complete.     Subjective: Seen and examined earlier this morning.  No major events overnight of this morning.  No complaints.  Patient's son at bedside.  Objective: Vitals:   06/01/23 1432 06/01/23 1511 06/01/23 1924 06/01/23 2028  BP: 102/60 104/60 (!) 151/65 121/68  Pulse: 86 81 86 75  Resp: 16  17   Temp:  (!) 97.5 F (36.4 C) (!) 97.3 F (36.3 C)   TempSrc:  Oral  Oral   SpO2: 97% 95% 99%   Weight:      Height:        Examination:  GENERAL: No apparent distress.  Nontoxic. HEENT: MMM.  Vision and hearing grossly intact.  NECK: Supple.  No apparent JVD.  RESP:  No IWOB.  Fair aeration bilaterally. CVS:  RRR. Heart sounds normal.  ABD/GI/GU: BS+. Abd soft, NTND.  MSK/EXT:  Moves extremities. No apparent deformity. No edema.  SKIN: no apparent skin lesion or wound NEURO: Awake, alert and oriented fairly.  No apparent focal neuro deficit. PSYCH: Calm. Normal affect.   Procedures:  None  Microbiology summarized: None  Assessment and plan: Principal Problem:   Syncope and collapse Active Problems:   Anemia   Orthostatic hypotension   GERD   Fibromyalgia   Syncope   CAD (coronary artery disease)   HLD (hyperlipidemia)   DDD (degenerative disc disease), lumbar   MDD (major depressive disorder), single episode, moderate (HCC)   Parkinson's disease   GAD (generalized anxiety disorder)   Mild neurocognitive disorder due to Parkinson's disease   Urinary incontinence  Syncope and collapse-had multiple episodes while at the neuro office Orthostatic hypotension: SBP 123>> 103 from sitting to standing but improved to 107 after standing. Seizure-like activity-had an episode of staring to the left while in ED -Patient had extensive syncope workup including TTE, carotid US and labs while at [facility] without significant finding -With history of Parkinson disease and positive orthostatic vitals, neurogenic orthostatic hypotension is  most likely -Another possibility is polypharmacy from clonazepam, nortriptyline and tramadol. -LTM EEG negative for seizure or epilepsy. -Telemetry without significant finding.  Unfortunately, a Zio patch cannot be interrogated until study is complete -TED hose -Follow further neuro recommendations -Continue home Florinef -PT/OT   Parkinson's disease with neurocognitive decline s/p DBS that was reprogrammed by  [name] neurologist on the day of admission -Continue home carbidopa-levodopa   Anxiety, depression and nortriptyline: On Klonopin, Zoloft and nortriptyline.  Could contribute to his syncope. -Continue home meds.  Reviewed risk and benefit with patient and son.   Degenerative disc disease -Continue home tramadol but could lower the threshold for seizure and also contribute to altered taste.   He is of CAD/HLD: Stable.  Does not seem to be on medications.   Normocytic anemia: Stable Recent Labs    01/03/23 0846 05/30/23 1012 05/31/23 0236 06/01/23 0738  HGB 12.2 11.9* 10.7* 11.8*    Body mass index is 25.16 kg/m.           DVT prophylaxis:  Place TED hose Start: 05/31/23 1229 enoxaparin (LOVENOX) injection 40 mg Start: 05/30/23 2200  Code Status: Full code Family Communication: Updated patient's son at bedside Level of care: Telemetry Medical Status is: Inpatient The patient will remain inpatient because: Orthostatic hypotension, possible syncope/seizure-like activity   Final disposition: Home with home health. Consultants:  Neurology Cardiology  35 minutes with more than 50% spent in reviewing records, counseling patient/family and coordinating care.   Sch Meds:  Scheduled Meds:  carbidopa-levodopa  2 tablet Oral BID   clonazePAM  0.5 mg Oral QHS   enoxaparin (LOVENOX) injection  40 mg Subcutaneous Q24H   fludrocortisone  100 mcg Oral Daily   latanoprost  1 drop Both Eyes QHS   nortriptyline  10 mg Oral QHS   sertraline  100 mg Oral Daily   sodium chloride flush  3 mL Intravenous Q12H   Continuous Infusions: PRN Meds:.acetaminophen **OR** acetaminophen, polyethylene glycol, polyvinyl alcohol, traMADol  Antimicrobials: Anti-infectives (From admission, onward)    None        I have personally reviewed the following labs and images: CBC: Recent Labs  Lab 05/30/23 1012 05/31/23 0236 06/01/23 0738  WBC 5.4 5.6 5.4  HGB 11.9* 10.7* 11.8*  HCT 37.6  32.7* 38.1  MCV 89.5 87.0 89.0  PLT 259 232 236   BMP &GFR Recent Labs  Lab 05/30/23 1012 05/31/23 0236 06/01/23 0738  NA 136 138 138  K 3.8 3.4* 3.7  CL 99 102 101  CO2 26 28 28   GLUCOSE 111* 107* 93  BUN 17 15 15   CREATININE 0.97 0.78 0.79  CALCIUM 10.8* 10.0 10.0  MG  --   --  1.6*  PHOS  --   --  4.5   Estimated Creatinine Clearance: 57.8 mL/min (by C-G formula based on SCr of 0.79 mg/dL). Liver & Pancreas: Recent Labs  Lab 05/31/23 0236 06/01/23 0738  AST 14*  --   ALT 5  --   ALKPHOS 63  --   BILITOT 0.7  --   PROT 6.1*  --   ALBUMIN 3.5 3.8   No results for input(s): "LIPASE", "AMYLASE" in the last 168 hours. No results for input(s): "AMMONIA" in the last 168 hours. Diabetic: No results for input(s): "HGBA1C" in the last 72 hours. Recent Labs  Lab 05/31/23 0448 06/01/23 0459 06/01/23 1930  GLUCAP 110* 102* 124*   Cardiac Enzymes: No results for input(s): "CKTOTAL", "CKMB", "CKMBINDEX", "TROPONINI" in the last  168 hours. No results for input(s): "PROBNP" in the last 8760 hours. Coagulation Profile: No results for input(s): "INR", "PROTIME" in the last 168 hours. Thyroid Function Tests: No results for input(s): "TSH", "T4TOTAL", "FREET4", "T3FREE", "THYROIDAB" in the last 72 hours. Lipid Profile: No results for input(s): "CHOL", "HDL", "LDLCALC", "TRIG", "CHOLHDL", "LDLDIRECT" in the last 72 hours. Anemia Panel: No results for input(s): "VITAMINB12", "FOLATE", "FERRITIN", "TIBC", "IRON", "RETICCTPCT" in the last 72 hours. Urine analysis:    Component Value Date/Time   COLORURINE YELLOW 05/30/2023 1013   APPEARANCEUR CLEAR 05/30/2023 1013   LABSPEC 1.009 05/30/2023 1013   PHURINE 7.0 05/30/2023 1013   GLUCOSEU NEGATIVE 05/30/2023 1013   HGBUR NEGATIVE 05/30/2023 1013   HGBUR trace-lysed 09/02/2010 0954   BILIRUBINUR NEGATIVE 05/30/2023 1013   BILIRUBINUR negative 02/20/2023 0958   KETONESUR NEGATIVE 05/30/2023 1013   PROTEINUR NEGATIVE  05/30/2023 1013   UROBILINOGEN 0.2 02/20/2023 0958   UROBILINOGEN 0.2 09/04/2010 1342   NITRITE NEGATIVE 05/30/2023 1013   LEUKOCYTESUR SMALL (A) 05/30/2023 1013   Sepsis Labs: Invalid input(s): "PROCALCITONIN", "LACTICIDVEN"  Microbiology: No results found for this or any previous visit (from the past 240 hour(s)).  Radiology Studies: No results found.    Theron Cumbie T. Mohab Ashby [facility]  If 7PM-7AM, please contact night-coverage [email or web address] 06/01/2023, 8:32 PM

## 2023-06-01 NOTE — Progress Notes (Signed)
EEG D/C'd. [facility] notified. No skin breakdown noted.  ?

## 2023-06-01 NOTE — TOC Initial Note (Signed)
Transition of Care [facility]) - Initial/Assessment Note    Patient Details  Name: [name] MRN: [id number] Date of Birth: 09-06-1946  Transition of Care [facility]) CM/SW Contact:    Janae Bridgeman, RN Phone Number: 06/01/2023, 3:27 PM  Clinical Narrative:                 CM met with the patient and son at the bedside to discuss TOC needs to return home once medically stable to discharge.  The patient admitted to the hospital with Syncopal episode and completed a 24 hour EEG.  Patient states she is waiting on neurology to clear her before she is able to return home with her family.  The patient's son, Tacey Heap is at the bedside and plans to provide transportation to home by car once patient is stable.  The patient currently attends Outpatient therapy at the [facility] location.  Renewed orders for Outpatient therapy placed to be co-signed by the attending MD.  No other TOC needs.  Expected Discharge Plan: OP Rehab Barriers to Discharge: Continued Medical Work up   Patient Goals and CMS Choice Patient states their goals for this hospitalization and ongoing recovery are:: To return home CMS Medicare.gov Compare Post Acute Care list provided to:: Patient Choice offered to / list presented to : Patient [place] ownership interest in [facility]provided to:: Patient    Expected Discharge Plan and Services   Discharge Planning Services: CM Consult Post Acute Care Choice: Resumption of Svcs/PTA Provider Living arrangements for the past 2 months: Single Family Home                                      Prior Living Arrangements/Services Living arrangements for the past 2 months: Single Family Home Lives with:: Adult Children (Patient lives with son and 2 daughters in the home) Patient language and need for interpreter reviewed:: Yes Do you feel safe going back to the place where you live?: Yes      Need for Family Participation in Patient Care: Yes  (Comment) Care giver support system in place?: Yes (comment) Current home services: DME (DME at home includes Rolator, Cane, 3:1, shower seat, WC, RW, Lift chair) Criminal Activity/Legal Involvement Pertinent to Current Situation/Hospitalization: No - Comment as needed  Activities of Daily Living Home Assistive Devices/Equipment: None ADL Screening (condition at time of admission) Patient's cognitive ability adequate to safely complete daily activities?: Yes Is the patient deaf or have difficulty hearing?: No Does the patient have difficulty seeing, even when wearing glasses/contacts?: No Does the patient have difficulty concentrating, remembering, or making decisions?: No Patient able to express need for assistance with ADLs?: Yes Does the patient have difficulty dressing or bathing?: Yes Independently performs ADLs?: Yes (appropriate for developmental age) Does the patient have difficulty walking or climbing stairs?: Yes Weakness of Legs: Both Weakness of Arms/Hands: None  Permission Sought/Granted Permission sought to share information with : Case Manager, Other (comment) (OP Rehab through [facility] location) Permission granted to share information with : Yes, Verbal Permission Granted     Permission granted to share info w AGENCY: OP Referral for [facility] location  Permission granted to share info w Relationship: son [name] Stohler [phone]     Emotional Assessment Appearance:: Appears stated age Attitude/Demeanor/Rapport: Gracious Affect (typically observed): Accepting Orientation: : Oriented to Self, Oriented to Place, Oriented to  Time, Oriented to Situation Alcohol / Substance Use: Not Applicable Psych Involvement: No (comment)  Admission diagnosis:  Syncope and collapse [R55] Syncope, unspecified syncope type [R55] Syncope [R55] Patient Active Problem List   Diagnosis Date Noted   Syncope and collapse 05/30/2023   Urinary  incontinence 01/12/2023   Lumbar radiculopathy, chronic 05/16/2022   Pre-op evaluation 04/26/2022   Localized swelling of chest wall 12/23/2021   Other fatigue 11/25/2021   Lymphadenopathy 11/25/2021   Closed fracture of rib of right side with routine healing 05/28/2021   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   Left shoulder pain 06/19/2020   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   Degenerative lumbar spinal stenosis 11/27/2019   Knee pain, bilateral 08/06/2018   Constipation 08/06/2018   Right lumbar radiculopathy 04/10/2018   Dysphagia 10/09/2017   Chronic cough 07/16/2017   GAD (generalized anxiety disorder) 04/28/2016   Chronic insomnia 03/07/2016   Left Achilles tendinitis 12/08/2015   Parkinson's disease 08/25/2015   Advanced care planning/counseling discussion 05/26/2015   Family circumstance 02/23/2015   Encounter for general adult medical examination with abnormal findings 05/21/2014   MDD (major depressive disorder), single episode, moderate (HCC) 11/12/2013   Dysuria 09/02/2013   DDD (degenerative disc disease), lumbar 05/31/2013   Medicare annual wellness visit, subsequent 05/20/2013   HLD (hyperlipidemia) 05/20/2013   Vitamin D deficiency 05/09/2013   Recurrent UTI 01/03/2011   Syncope 01/03/2011   Rash and other nonspecific skin eruption 05/11/2010   GERD 02/22/2010   CAD (coronary artery disease) 01/25/2010   Osteoporosis 11/10/2009   Orthostatic hypotension 12/06/2008   Anemia 09/25/2007   Vitamin B12 deficiency 05/03/2007   Fibromyalgia 05/03/2007   PCP:  Eustaquio Boyden, MD Pharmacy:   [facility] - [place], [place] - [address] [place] [address] Phone: [phone] Fax: [phone]  [id number] - [place], [place] - [address] AT [facility] [address] [place] [address] Phone: [phone] Fax: [phone]     Social Determinants of Health (SDOH) Social History: SDOH Screenings   Food Insecurity: No Food Insecurity (05/30/2023)  Housing: Low Risk  (05/30/2023)  Transportation Needs: No Transportation Needs (05/30/2023)  Utilities: Not At Risk (05/30/2023)  Alcohol Screen: Low Risk  (10/28/2021)  Depression (PHQ2-9): Medium Risk (05/25/2023)  Financial Resource Strain: Patient Declined (02/19/2023)  Physical Activity: Unknown (02/19/2023)  Social Connections: Moderately Integrated (02/19/2023)  Stress: Patient Declined (02/19/2023)  Tobacco Use: Low Risk  (05/30/2023)   SDOH Interventions:     Readmission Risk Interventions    06/01/2023    3:24 PM  Readmission Risk Prevention Plan  Post Dischage Appt Complete  Medication Screening Complete  Transportation Screening Complete

## 2023-06-01 NOTE — Evaluation (Signed)
Occupational Therapy Evaluation Patient Details Name: [name] MRN: [id number] DOB: [birth year] Today's Date: 06/01/2023   History of Present Illness 77 yo female admitted 7/3 with syncope. PMhx: syncope, orthostasis, Parkinson's dz, neurocognitive decline, HLD, GERD, CAD, anemia, depression, anxiety, fibromyalgia, DDD s/p L4-5 decompression   Clinical Impression   PTA, pt lives with family, typically Modified Independent with ADLs/mobility. Pt presents now with minor deficits in standing balance, strength and endurance. Pt able to manage LB ADLs and steps at bedside with no more than min guard. Mobility progression limited as pt still connected to LTM EEG. Pt was noted with minor symptoms associated with orthostasis during session that resolved with prolonged standing. Pt reports syncopal episodes occurred while seated PTA. Will follow acutely though anticipate no OT needs at DC. Pt reports she was receiving OP PT and SLP.   BP supine: 102/72 (82) BP sitting: 109/70 (83) BP standing: 81/65 (72) BP standing > 3 min: 101/74 (81)     Recommendations for follow up therapy are one component of a multi-disciplinary discharge planning process, led by the attending physician.  Recommendations may be updated based on patient status, additional functional criteria and insurance authorization.   Assistance Recommended at Discharge Intermittent Supervision/Assistance  Patient can return home with the following Assistance with cooking/housework;Assist for transportation;Help with stairs or ramp for entrance    Functional Status Assessment  Patient has had a recent decline in their functional status and demonstrates the ability to make significant improvements in function in a reasonable and predictable amount of time.  Equipment Recommendations  None recommended by OT    Recommendations for Other Services       Precautions / Restrictions Precautions Precautions: Fall;Other  (comment) Precaution Comments: LTM EEG, monitor orthostatics Restrictions Weight Bearing Restrictions: No      Mobility Bed Mobility Overal bed mobility: Needs Assistance Bed Mobility: Supine to Sit, Sit to Supine     Supine to sit: Min assist Sit to supine: Supervision   General bed mobility comments: handheld assist to lift trunk    Transfers Overall transfer level: Needs assistance Equipment used: None Transfers: Sit to/from Stand Sit to Stand: Min guard           General transfer comment: for safety but able to easily stand      Balance Overall balance assessment: Needs assistance Sitting-balance support: No upper extremity supported, Feet supported Sitting balance-Leahy Scale: Good     Standing balance support: No upper extremity supported, During functional activity Standing balance-Leahy Scale: Fair                             ADL either performed or assessed with clinical judgement   ADL Overall ADL's : Needs assistance/impaired Eating/Feeding: Independent   Grooming: Supervision/safety;Standing   Upper Body Bathing: Set up;Sitting   Lower Body Bathing: Min guard;Sit to/from stand   Upper Body Dressing : Set up   Lower Body Dressing: Min guard;Sitting/lateral leans;Sit to/from stand Lower Body Dressing Details (indicate cue type and reason): able to easily cross LE to reach socks, min guard for safety in standing Toilet Transfer: Min Acupuncturist Details (indicate cue type and reason): limited in mobility d/t LTM EEG; likely able to ambulate to bathroom with assist Toileting- Clothing Manipulation and Hygiene: Min guard         General ADL Comments: Minor associated orthostatic symptoms in standing that subsided w/ increased time in standing. discussed fall prevention, safety  precautions if BP dropping and pt other syncopal episodes (occurred while she was in sitting twice PTA per pt)     Vision  Ability to See in Adequate Light: 0 Adequate Patient Visual Report: No change from baseline Vision Assessment?: No apparent visual deficits     Perception     Praxis      Pertinent Vitals/Pain Pain Assessment Pain Assessment: No/denies pain     Hand Dominance Right   Extremity/Trunk Assessment Upper Extremity Assessment Upper Extremity Assessment: Generalized weakness   Lower Extremity Assessment Lower Extremity Assessment: Defer to PT evaluation   Cervical / Trunk Assessment Cervical / Trunk Assessment: Normal   Communication Communication Communication: No difficulties   Cognition Arousal/Alertness: Awake/alert Behavior During Therapy: WFL for tasks assessed/performed Overall Cognitive Status: Within Functional Limits for tasks assessed                                 General Comments: not formally assessed, appears [facility] and able to recount details of medical issues     General Comments  Pt call bell noted to be malfunctioning throughout session; calling nurses station, muting/unmuting tv and turning bed light on - secretary aware and plans to call facilities    Exercises     Shoulder Instructions      Home Living Family/patient expects to be discharged to:: Private residence Living Arrangements: Children Available Help at Discharge: Family;Available 24 hours/day Type of Home: House Home Access: Level entry     Home Layout: Two level;Able to live on main level with bedroom/bathroom     Bathroom Shower/Tub: Producer, television/film/video: Standard     Home Equipment: Rollator (4 wheels);Cane - single point;BSC/3in1;Shower seat;Wheelchair - Careers adviser (comment);Standard Walker (lift chair)          Prior Functioning/Environment Prior Level of Function : Independent/Modified Independent             Mobility Comments: uses cane or rollator intermittently ADLs Comments: can drive short distances, performing ADLs, family does the  homemaking        OT Problem List: Decreased strength;Decreased activity tolerance;Impaired balance (sitting and/or standing)      OT Treatment/Interventions: Self-care/ADL training;Therapeutic exercise;Energy conservation;DME and/or AE instruction;Therapeutic activities;Patient/family education;Balance training    OT Goals(Current goals can be found in the care plan section) Acute Rehab OT Goals Patient Stated Goal: have LTM EEG removed, be able to walk OT Goal Formulation: With patient Time For Goal Achievement: 06/15/23 Potential to Achieve Goals: Good  OT Frequency: Min 2X/week    Co-evaluation              AM-PAC OT "6 Clicks" Daily Activity     Outcome Measure Help from another person eating meals?: None Help from another person taking care of personal grooming?: A Little Help from another person toileting, which includes using toliet, bedpan, or urinal?: A Little Help from another person bathing (including washing, rinsing, drying)?: A Little Help from another person to put on and taking off regular upper body clothing?: A Little Help from another person to put on and taking off regular lower body clothing?: A Little 6 Click Score: 19   End of Session Nurse Communication: Mobility status  Activity Tolerance: Patient tolerated treatment well Patient left: in bed;with call bell/phone within reach  OT Visit Diagnosis: Unsteadiness on feet (R26.81);Muscle weakness (generalized) (M62.81)  Time: 1610-9604 OT Time Calculation (min): 20 min Charges:  OT General Charges $OT Visit: 1 Visit OT Evaluation $OT Eval Low Complexity: 1 Low  Bradd Canary, OTR/L Acute Rehab Services Office: [phone]   Lorre Munroe 06/01/2023, 8:33 AM

## 2023-06-01 NOTE — Plan of Care (Signed)
  Problem: Education: Goal: Knowledge of condition and prescribed therapy will improve Outcome: Adequate for Discharge   Problem: Cardiac: Goal: Will achieve and/or maintain adequate cardiac output Outcome: Adequate for Discharge   Problem: Physical Regulation: Goal: Complications related to the disease process, condition or treatment will be avoided or minimized Outcome: Adequate for Discharge   Problem: Education: Goal: Knowledge of General Education information will improve Description: Including pain rating scale, medication(s)/side effects and non-pharmacologic comfort measures Outcome: Adequate for Discharge   Problem: Health Behavior/Discharge Planning: Goal: Ability to manage health-related needs will improve Outcome: Adequate for Discharge   Problem: Clinical Measurements: Goal: Ability to maintain clinical measurements within normal limits will improve Outcome: Adequate for Discharge Goal: Will remain free from infection Outcome: Adequate for Discharge Goal: Diagnostic test results will improve Outcome: Adequate for Discharge Goal: Respiratory complications will improve Outcome: Adequate for Discharge Goal: Cardiovascular complication will be avoided Outcome: Adequate for Discharge   Problem: Activity: Goal: Risk for activity intolerance will decrease Outcome: Adequate for Discharge   Problem: Nutrition: Goal: Adequate nutrition will be maintained Outcome: Adequate for Discharge   Problem: Coping: Goal: Level of anxiety will decrease Outcome: Adequate for Discharge   Problem: Elimination: Goal: Will not experience complications related to bowel motility Outcome: Adequate for Discharge Goal: Will not experience complications related to urinary retention Outcome: Adequate for Discharge   Problem: Pain Managment: Goal: General experience of comfort will improve Outcome: Adequate for Discharge   Problem: Safety: Goal: Ability to remain free from injury  will improve Outcome: Adequate for Discharge   Problem: Skin Integrity: Goal: Risk for impaired skin integrity will decrease Outcome: Adequate for Discharge   

## 2023-06-02 DIAGNOSIS — R569 Unspecified convulsions: Secondary | ICD-10-CM | POA: Diagnosis not present

## 2023-06-02 DIAGNOSIS — R32 Unspecified urinary incontinence: Secondary | ICD-10-CM

## 2023-06-02 DIAGNOSIS — R55 Syncope and collapse: Secondary | ICD-10-CM | POA: Diagnosis not present

## 2023-06-02 DIAGNOSIS — K219 Gastro-esophageal reflux disease without esophagitis: Secondary | ICD-10-CM | POA: Diagnosis not present

## 2023-06-02 LAB — GLUCOSE, CAPILLARY: Glucose-Capillary: 96 mg/dL (ref 70–99)

## 2023-06-02 NOTE — Discharge Summary (Signed)
Physician Discharge Summary  [name] WCB:[id number] DOB: June 06, 1946 DOA: 05/30/2023  PCP: Tara Boyden, MD  Admit date: 05/30/2023 Discharge date: 06/02/2023 Admitted From: Home Disposition: Home Recommendations for Outpatient Follow-up:  Follow up with PCP in 1 week Outpatient follow-up with neurology as previously planned Patient might be at risk for polypharmacy on clonazepam, nortriptyline and tramadol.  Reassess need Check CMP and CBC at follow-up Please follow up on the following pending results: None  Home Health: Ambulatory referral to outpatient rehab Equipment/Devices: Patient has appropriate DME  Discharge Condition: Stable CODE STATUS: Full code  Follow-up Information     [facility] at [facility]. Schedule an appointment as soon as possible for a visit.   Specialty: Rehabilitation Why: [facility]. Contact information: [address] [id number] [place] [address] [phone]        Tara Boyden, MD. Schedule an appointment as soon as possible for a visit in 1 week(s).   Specialty: Family Medicine Contact information: [address] [place] [address] [phone]                 Hospital course 77 year old F with PMH of Parkinson's disease s/p DBS, orthostatic hypotension, diastolic CHF, anxiety, depression, fibromyalgia and degenerative disc disease sent to ED from neurology office with what looks like a syncopal episode versus seizure.  Patient was at neurology office and had had DBS reprogrammed by a neurologist.  She started feeling unwell and slumped over while sitting down.  Cardiology office note, stable vitals.  She was placed in.  And had multiple syncopal episodes but was easily aroused by sternal rub.  While in ED, she had additional episode where she was staring off to the left and was not  responding.  She was slightly hypertensive.  Labs without significant finding other than slightly elevated calcium.  UA does not suggest UTI.  CXR and CT head without acute finding.  Neurology consulted and admitted for further evaluation with LTM EEG.   Patient was recently hospitalized at [facility] for syncopal episode and had extensive workup including TTE, carotid US and spot EEG that were unrevealing.  She was seen by PCP on 6/28.  She had Zio patch placed on 7/1.   LTM EEG negative for seizure or epilepsy.  Telemetry without significant finding.  Unfortunately, his Zio patch cannot be interrogated until study is complete.  No further events since hospitalization.  Orthostatic vitals positive but patient was not symptomatic.  Cleared for discharge by neurology for outpatient follow-up.  Patient is at risk for polypharmacy on clonazepam, nortriptyline and tramadol.  Discussed this with patient and his son.  Encouraged to discuss further with prescribers.   See individual problem list below for more.   Problems addressed during this hospitalization Principal Problem:   Syncope and collapse Active Problems:   Anemia   Orthostatic hypotension   GERD   Fibromyalgia   Syncope   CAD (coronary artery disease)   HLD (hyperlipidemia)   DDD (degenerative disc disease), lumbar   MDD (major depressive disorder), single episode, moderate (HCC)   Parkinson's disease   GAD (generalized anxiety disorder)   Mild neurocognitive disorder due to Parkinson's disease   Urinary incontinence   Syncope and collapse-had multiple episodes while at the neuro office Orthostatic hypotension: Orthostatic vitals positive but not symptomatic. Seizure-like activity-had an episode of staring to the left while in ED -Patient had extensive  syncope workup including TTE, carotid US and labs while at [facility] without significant finding -With history of Parkinson disease and positive orthostatic vitals, neurogenic  orthostatic hypotension is most likely -Another possibility is polypharmacy from clonazepam, nortriptyline and tramadol. -LTM EEG negative for seizure or epilepsy. -Telemetry without significant finding.  Unfortunately, a Zio patch cannot be interrogated until study is complete -Encouraged to continue using abdominal binder.  -Chronic home Florinef -Ambulatory referral to outpatient rehab   Parkinson's disease with neurocognitive decline s/p DBS that was reprogrammed by her neurologist on the day of admission -Continue home carbidopa-levodopa   Anxiety, depression and nortriptyline: On Klonopin, Zoloft and nortriptyline.  Could contribute to his syncope. -Continue home meds.  Reviewed risk and benefit with patient and son.   Degenerative disc disease -Continue home tramadol but could lower the threshold for seizure and also contribute to altered taste.   He is of CAD/HLD: Stable.  Does not seem to be on medications.   Normocytic anemia: Stable           Time spent 35 minutes  Vital signs Vitals:   06/02/23 0011 06/02/23 0429 06/02/23 0500 06/02/23 0708  BP: 98/62 117/67  119/68  Pulse: 80 71  77  Temp: (!) 97.4 F (36.3 C) 97.7 F (36.5 C)  97.8 F (36.6 C)  Resp: 18 18  17   Height:      Weight:   69.2 kg   SpO2: 94% 93%  98%  TempSrc: Oral Oral  Oral  BMI (Calculated):   24.64      Discharge exam  GENERAL: No apparent distress.  Nontoxic. HEENT: MMM.  Vision and hearing grossly intact.  NECK: Supple.  No apparent JVD.  RESP:  No IWOB.  Fair aeration bilaterally. CVS:  RRR. Heart sounds normal.  ABD/GI/GU: BS+. Abd soft, NTND.  MSK/EXT:  Moves extremities. No apparent deformity. No edema.  SKIN: no apparent skin lesion or wound NEURO: Awake and alert. Oriented appropriately.  No apparent focal neuro deficit. PSYCH: Calm. Normal affect.   Discharge Instructions Discharge Instructions     Ambulatory referral to Physical Therapy   Complete by: As directed     Iontophoresis - 4 mg/ml of dexamethasone: No   T.E.N.S. Unit Evaluation and Dispense as Indicated: No   Diet general   Complete by: As directed    Discharge instructions   Complete by: As directed    It has been a pleasure taking care of you!  You were hospitalized due to spells of unresponsiveness.  It is unclear what caused the symptoms but we suspect low blood pressure and/or medication side effect.  You EEG did not show seizure.  Note that the combination of clonazepam, nortriptyline and tramadol can increase your risk confusion, sedation or low blood pressure.  We strongly encourage you to discuss these medications with your prescribers.  Follow-up with your primary care doctor in 1 to 2 weeks or sooner if needed.  Follow-up with your neurologist as previously planned.   Take care,   Increase activity slowly   Complete by: As directed       Allergies as of 06/02/2023       Reactions   Iohexol Hives, Shortness Of Breath    Code: HIVES, Desc: PT developed 2 hives, followed by SOB, severe headache post 87cc's Omnipaque 300., Onset Date: [id number]   Amitriptyline Other (See Comments)   Sedated next morning   Ciprofloxacin Nausea And Vomiting   Cymbalta [duloxetine Hcl] Other (See Comments)  Worsened depression   Lyrica [pregabalin] Other (See Comments)   Tried during hospitalization - unsure effects but unable to tolerate   Imipramine Hcl Rash   Iodine Rash   Lidocaine Hcl Rash   Morphine Sulfate Rash   Neosporin [neomycin-bacitracin Zn-polymyx] Rash, Other (See Comments)   Worsened skin breaking out   Sulfamethoxazole Rash   Tetracyclines & Related Rash        Medication List     TAKE these medications    acetaminophen 500 MG tablet Commonly known as: TYLENOL Take 2 tablets (1,000 mg total) by mouth in the morning, at noon, and at bedtime.   AMBULATORY NON FORMULARY MEDICATION Lift chair Dx:  G20   Artificial Tears 0.2-0.2-1 % Soln Generic drug:  Glycerin-Hypromellose-PEG 400 Place 1 drop into both eyes daily as needed (dry eyes).   BD SafetyGlide Syringe/Needle 25G X 1" 3 ML Misc Generic drug: SYRINGE-NEEDLE (DISP) 3 ML Use to inject vitamin B12 monthly.   Calcium Carbonate-Vitamin D 600-400 MG-UNIT chew tablet Commonly known as: Calcium 600/Vitamin D Chew 1 tablet by mouth daily.   carbidopa-levodopa 25-100 MG tablet Commonly known as: SINEMET IR 1 po tid prn What changed:  how much to take how to take this when to take this additional instructions   clonazePAM 0.5 MG tablet Commonly known as: KLONOPIN TAKE 1/2TABS (0.25 MG TOTAL) BY MOUTH EVERY MORNING AND 1 TAB (0.5 MG TOTAL) AT BEDTIME. What changed: See the new instructions.   clotrimazole-betamethasone cream Commonly known as: LOTRISONE Apply 1 application topically daily.   Cranberry-Vitamin C 250-60 MG Caps Commonly known as: AZO Cranberry Urinary Tract Take 2 capsules by mouth daily.   cyanocobalamin 1000 MCG/ML injection Commonly known as: VITAMIN B12 INJECT 1 ML (1,000 MCG TOTAL) INTO THE MUSCLE EVERY 30 DAYS. What changed: See the new instructions.   denosumab 60 MG/ML Sosy injection Commonly known as: PROLIA Inject 60 mg into the skin every 6 (six) months.   fludrocortisone 0.1 MG tablet Commonly known as: FLORINEF TAKE 1 TABLET BY MOUTH EVERY DAY   latanoprost 0.005 % ophthalmic solution Commonly known as: XALATAN Place 1 drop into both eyes at bedtime.   nystatin cream Commonly known as: MYCOSTATIN Apply 1 Application topically 2 (two) times daily. To affected area/rash   Pamelor 10 MG capsule Generic drug: nortriptyline Take 1 capsule (10 mg total) by mouth at bedtime.   sertraline 100 MG tablet Commonly known as: ZOLOFT Take 1 tablet (100 mg total) by mouth daily.   traMADol 50 MG tablet Commonly known as: ULTRAM Take 1 tablet (50 mg total) by mouth every 6 (six) hours as needed for moderate pain.   Vitamin D3 25 MCG (1000  UT) Caps Take 1 capsule (1,000 Units total) by mouth daily.        Consultations: Neurology  Procedures/Studies:   Overnight EEG with video  Result Date: 05/31/2023 Charlsie Quest, MD     06/01/2023  9:32 AM Patient Name: [name] MRN: [id number] Epilepsy Attending: Charlsie Quest Referring Physician/Provider: Mathews Argyle, NP Duration: 05/30/2023 1435 to 05/31/2023 1435 Patient history: 77 yo F patient with multiple syncopal episodes with LOC, while in the ED had a blank stare to the left. EEG to evaluate for seizure. Level of alertness: Awake, asleep AEDs during EEG study: Clonazepam Technical aspects: This EEG study was done with scalp electrodes positioned according to the 10-20 International system of electrode placement. Electrical activity was reviewed with band pass filter of 1-70Hz , sensitivity of  7 uV/mm, display speed of 41mm/sec with a 60Hz  notched filter applied as appropriate. EEG data were recorded continuously and digitally stored.  Video monitoring was available and reviewed as appropriate. Description: The posterior dominant rhythm consists of 9 Hz activity of moderate voltage (25-35 uV) seen predominantly in posterior head regions, symmetric and reactive to eye opening and eye closing. Sleep was characterized by vertex waves, sleep spindles (12 to 14 Hz), maximal frontocentral region. There is an excessive amount of 15 to 18 Hz beta activity with irregular morphology distributed symmetrically and diffusely. Hyperventilation and photic stimulation were not performed.  ABNORMALITY - Excessive beta, generalized IMPRESSION: This study is within normal limits. The excessive beta activity seen in the background is most likely due to the effect of benzodiazepine and is a benign EEG pattern. No seizures or epileptiform discharges were seen throughout the recording. A normal interictal EEG does not exclude the diagnosis of epilepsy. Charlsie Quest   CT Head Wo Contrast  Result  Date: 05/30/2023 CLINICAL DATA:  Syncope/presyncope. Cerebrovascular cause suspected. EXAM: CT HEAD WITHOUT CONTRAST TECHNIQUE: Contiguous axial images were obtained from the base of the skull through the vertex without intravenous contrast. RADIATION DOSE REDUCTION: This exam was performed according to the departmental dose-optimization program which includes automated exposure control, adjustment of the mA and/or kV according to patient size and/or use of iterative reconstruction technique. COMPARISON:  07/22/2021 FINDINGS: Brain: Deep brain stimulators in place within both thalamic regions. No complicating feature or apparent change. The brain otherwise does not show any evidence of old or acute infarction, mass lesion, hemorrhage, hydrocephalus or extra-axial collection. Vascular: No abnormal vascular finding. Skull: Negative other than the procedural burr holes. Sinuses/Orbits: Clear/normal Other: None IMPRESSION: No acute finding by CT. Deep brain stimulators in place within both thalamic regions. No complicating feature or apparent change. Electronically Signed   By: Paulina Fusi M.D.   On: 05/30/2023 11:21   DG Chest Port 1 View  Result Date: 05/30/2023 CLINICAL DATA:  Weakness EXAM: PORTABLE CHEST 1 VIEW COMPARISON:  04/26/2022 FINDINGS: Generator pack overlies the right chest. Heart size is normal. Mediastinal shadows are normal. Mild pleural and parenchymal scarring seen at the lung apices. No evidence of active infiltrate, collapse or effusion. Old healed rib fractures on the right. IMPRESSION: No active disease. Mild pleural and parenchymal scarring at the lung apices. Old healed rib fractures on the right. Electronically Signed   By: Paulina Fusi M.D.   On: 05/30/2023 11:08       The results of significant diagnostics from this hospitalization (including imaging, microbiology, ancillary and laboratory) are listed below for reference.     Microbiology: No results found for this or any  previous visit (from the past 240 hour(s)).   Labs:  CBC: Recent Labs  Lab 05/30/23 1012 05/31/23 0236 06/01/23 0738  WBC 5.4 5.6 5.4  HGB 11.9* 10.7* 11.8*  HCT 37.6 32.7* 38.1  MCV 89.5 87.0 89.0  PLT 259 232 236   BMP &GFR Recent Labs  Lab 05/30/23 1012 05/31/23 0236 06/01/23 0738  NA 136 138 138  K 3.8 3.4* 3.7  CL 99 102 101  CO2 26 28 28   GLUCOSE 111* 107* 93  BUN 17 15 15   CREATININE 0.97 0.78 0.79  CALCIUM 10.8* 10.0 10.0  MG  --   --  1.6*  PHOS  --   --  4.5   Estimated Creatinine Clearance: 57.8 mL/min (by C-G formula based on SCr of 0.79 mg/dL). Liver &  Pancreas: Recent Labs  Lab 05/31/23 0236 06/01/23 0738  AST 14*  --   ALT 5  --   ALKPHOS 63  --   BILITOT 0.7  --   PROT 6.1*  --   ALBUMIN 3.5 3.8   No results for input(s): "LIPASE", "AMYLASE" in the last 168 hours. No results for input(s): "AMMONIA" in the last 168 hours. Diabetic: No results for input(s): "HGBA1C" in the last 72 hours. Recent Labs  Lab 05/31/23 0448 06/01/23 0459 06/01/23 1930 06/02/23 0431  GLUCAP 110* 102* 124* 96   Cardiac Enzymes: No results for input(s): "CKTOTAL", "CKMB", "CKMBINDEX", "TROPONINI" in the last 168 hours. No results for input(s): "PROBNP" in the last 8760 hours. Coagulation Profile: No results for input(s): "INR", "PROTIME" in the last 168 hours. Thyroid Function Tests: No results for input(s): "TSH", "T4TOTAL", "FREET4", "T3FREE", "THYROIDAB" in the last 72 hours. Lipid Profile: No results for input(s): "CHOL", "HDL", "LDLCALC", "TRIG", "CHOLHDL", "LDLDIRECT" in the last 72 hours. Anemia Panel: No results for input(s): "VITAMINB12", "FOLATE", "FERRITIN", "TIBC", "IRON", "RETICCTPCT" in the last 72 hours. Urine analysis:    Component Value Date/Time   COLORURINE YELLOW 05/30/2023 1013   APPEARANCEUR CLEAR 05/30/2023 1013   LABSPEC 1.009 05/30/2023 1013   PHURINE 7.0 05/30/2023 1013   GLUCOSEU NEGATIVE 05/30/2023 1013   HGBUR NEGATIVE  05/30/2023 1013   HGBUR trace-lysed 09/02/2010 0954   BILIRUBINUR NEGATIVE 05/30/2023 1013   BILIRUBINUR negative 02/20/2023 0958   KETONESUR NEGATIVE 05/30/2023 1013   PROTEINUR NEGATIVE 05/30/2023 1013   UROBILINOGEN 0.2 02/20/2023 0958   UROBILINOGEN 0.2 09/04/2010 1342   NITRITE NEGATIVE 05/30/2023 1013   LEUKOCYTESUR SMALL (A) 05/30/2023 1013   Sepsis Labs: Invalid input(s): "PROCALCITONIN", "LACTICIDVEN"   SIGNED:  Almon Hercules, MD  [facility] 06/02/2023, 10:26 AM

## 2023-06-03 ENCOUNTER — Other Ambulatory Visit: Payer: Self-pay | Admitting: Neurology

## 2023-06-03 DIAGNOSIS — G20B1 Parkinson's disease with dyskinesia, without mention of fluctuations: Secondary | ICD-10-CM

## 2023-06-04 ENCOUNTER — Telehealth: Payer: Self-pay | Admitting: *Deleted

## 2023-06-04 ENCOUNTER — Encounter: Payer: Self-pay | Admitting: Family Medicine

## 2023-06-04 DIAGNOSIS — R55 Syncope and collapse: Secondary | ICD-10-CM

## 2023-06-04 NOTE — Transitions of Care (Post Inpatient/ED Visit) (Signed)
06/04/2023  Name: [name] MRN: [id number] DOB: 1946-02-14  Today's TOC FU Call Status: Today's TOC FU Call Status:: Successful TOC FU Call Competed TOC FU Call Complete Date: 06/04/23  Transition Care Management Follow-up Telephone Call Date of Discharge: 06/02/23 Discharge Facility: [facility]) Name of Other (Non[facility]) Discharge Facility: [facility] Type of Discharge: Inpatient Admission Primary Inpatient Discharge Diagnosis:: syncope How have you been since you were released from the hospital?: Better Any questions or concerns?: Yes Patient Questions/Concerns:: . She has contacted her PCP to see if he can get her an earlier appt Patient Questions/Concerns Addressed: Other:  Items Reviewed: Did you receive and understand the discharge instructions provided?: Yes Medications obtained,verified, and reconciled?: Yes (Medications Reviewed) Any new allergies since your discharge?: No Dietary orders reviewed?: No Do you have support at home?: Yes People in Home: child(ren), adult Name of Support/Comfort Primary Source: [name]  Medications Reviewed Today: Medications Reviewed Today     Reviewed by Tara Cook, RN (Case Manager) on 06/04/23 at 1451  Med List Status: <None>   Medication Order Taking? Sig Documenting Provider Last Dose Status Informant  acetaminophen (TYLENOL) 500 MG tablet [id number] Yes Take 2 tablets (1,000 mg total) by mouth in the morning, at noon, and at bedtime. Tara Boyden, MD Taking Active Self, Multiple Informants           Med Note ([name], [name]   Wed May 30, 2023  4:52 PM) Patient and son verified dose is correct...  AMBULATORY NON FORMULARY MEDICATION [id number] Yes Lift chair Dx:  Tara Bass, DO Taking Active Self, Multiple Informants  Calcium Carbonate-Vitamin D (CALCIUM 600/VITAMIN D) 600-400 MG-UNIT chew tablet [id number] Yes Chew 1 tablet by mouth daily. Tara Boyden, MD Taking Active Self, Multiple Informants            Med Note [name], [name]   Thu Jun 23, 2021  3:53 PM)    carbidopa-levodopa (SINEMET IR) 25-100 MG tablet [id number] Yes TAKE 1 TABLET BY MOUTH TWO TIMES A DAY AS NEEDED [name], Tara Batty, DO Taking Active   Cholecalciferol (VITAMIN D3) 25 MCG (1000 UT) CAPS [id number] Yes Take 1 capsule (1,000 Units total) by mouth daily. Tara Boyden, MD Taking Active Self, Multiple Informants           Med Note [name], [name]   Thu Jun 23, 2021  3:54 PM)    clonazePAM (KLONOPIN) 0.5 MG tablet [id number] Yes TAKE 1/2TABS (0.25 MG TOTAL) BY MOUTH EVERY MORNING AND 1 TAB (0.5 MG TOTAL) AT BEDTIME.  Patient taking differently: Take 0.5 mg by mouth at bedtime.   Tara Boyden, MD Taking Active Self, Multiple Informants  clotrimazole-betamethasone (LOTRISONE) cream [id number] Yes Apply 1 application topically daily. Tara Boyden, MD Taking Active Self, Multiple Informants  Cranberry-Vitamin C (AZO CRANBERRY URINARY TRACT) 250-60 MG CAPS [id number] Yes Take 2 capsules by mouth daily. Tara Boyden, MD Taking Active Self, Multiple Informants  cyanocobalamin (VITAMIN B12) 1000 MCG/ML injection [id number]  INJECT 1 ML (1,000 MCG TOTAL) INTO THE MUSCLE EVERY 30 DAYS.  Patient taking differently: Inject 1,000 mcg into the skin every 30 (thirty) days.   Tara Boyden, MD  Active Self, Multiple Informants  denosumab (PROLIA) 60 MG/ML SOSY injection [id number]  Inject 60 mg into the skin every 6 (six) months. [provider]  Active Self, Multiple Informants           Med Note ([name], [name]   Wed May 30, 2023  4:53 PM)  Patient and son states this medication is due this month   fludrocortisone (FLORINEF) 0.1 MG tablet [id number] Yes TAKE 1 TABLET BY MOUTH EVERY DAY Tara Boyden, MD Taking Active Self, Multiple Informants  Glycerin-Hypromellose-PEG 400 (ARTIFICIAL TEARS) 0.2-0.2-1 % SOLN [id number] Yes Place 1 drop into both eyes daily as needed (dry eyes). [provider]  Taking Active Self, Multiple Informants  latanoprost (XALATAN) 0.005 % ophthalmic solution [id number] Yes Place 1 drop into both eyes at bedtime. [provider] Taking Active Self, Multiple Informants  nortriptyline (PAMELOR) 10 MG capsule [id number] Yes Take 1 capsule (10 mg total) by mouth at bedtime. Tara Boyden, MD Taking Active Self, Multiple Informants  nystatin cream (MYCOSTATIN) [id number] Yes Apply 1 Application topically 2 (two) times daily. To affected area/rash Tara Boyden, MD Taking Active Self, Multiple Informants  sertraline (ZOLOFT) 100 MG tablet [id number] Yes Take 1 tablet (100 mg total) by mouth daily. Tara Boyden, MD Taking Active Self, Multiple Informants  SYRINGE-NEEDLE, DISP, 3 ML (BD SAFETYGLIDE SYRINGE/NEEDLE) 25G X 1" 3 ML MISC [id number] Yes Use to inject vitamin B12 monthly. Tara Boyden, MD Taking Active Self, Multiple Informants  traMADol (ULTRAM) 50 MG tablet [id number] Yes Take 1 tablet (50 mg total) by mouth every 6 (six) hours as needed for moderate pain. Tara Boyden, MD Taking Active Self, Multiple Informants            Home Care and Equipment/Supplies: Were Home Health Services Ordered?: NA Any new equipment or medical supplies ordered?: NA  Functional Questionnaire: Do you need assistance with bathing/showering or dressing?: No Do you need assistance with meal preparation?: No Do you need assistance with eating?: No Do you have difficulty maintaining continence: No Do you need assistance with getting out of bed/getting out of a chair/moving?: No Do you have difficulty managing or taking your medications?: No  Follow up appointments reviewed: PCP Follow-up appointment confirmed?: Yes Date of PCP follow-up appointment?: 06/08/23 Follow-up Provider: Dr Sharen [name] [facility] Follow-up appointment confirmed?: NA Do you need transportation to your follow-up appointment?: No Do you understand care options if your  condition(s) worsen?: Yes-patient verbalized understanding  SDOH Interventions Today    Flowsheet Row Most Recent Value  SDOH Interventions   Food Insecurity Interventions Intervention Not Indicated  Housing Interventions Intervention Not Indicated  Transportation Interventions Intervention Not Indicated, Patient Resources (Friends/Family)      Interventions Today    Flowsheet Row Most Recent Value  General Interventions   General Interventions Discussed/Reviewed General Interventions Discussed, General Interventions Reviewed, Doctor Visits  Doctor Visits Discussed/Reviewed Doctor Visits Discussed  [patient requested PCP to help get earlier cardiology appt]  Pharmacy Interventions   Pharmacy Dicussed/Reviewed Pharmacy Topics Discussed      TOC Interventions Today    Flowsheet Row Most Recent Value  TOC Interventions   TOC Interventions Discussed/Reviewed TOC Interventions Discussed, TOC Interventions Reviewed       Gean Maidens BSN RN [facility] [phone]

## 2023-06-06 DIAGNOSIS — R55 Syncope and collapse: Secondary | ICD-10-CM | POA: Diagnosis not present

## 2023-06-08 ENCOUNTER — Encounter: Payer: Self-pay | Admitting: Family Medicine

## 2023-06-08 ENCOUNTER — Ambulatory Visit (INDEPENDENT_AMBULATORY_CARE_PROVIDER_SITE_OTHER): Payer: Medicare Other | Admitting: Family Medicine

## 2023-06-08 VITALS — BP 136/76 | HR 73 | Temp 97.6°F | Ht 66.0 in | Wt 153.4 lb

## 2023-06-08 DIAGNOSIS — M810 Age-related osteoporosis without current pathological fracture: Secondary | ICD-10-CM

## 2023-06-08 DIAGNOSIS — F411 Generalized anxiety disorder: Secondary | ICD-10-CM

## 2023-06-08 DIAGNOSIS — I951 Orthostatic hypotension: Secondary | ICD-10-CM

## 2023-06-08 DIAGNOSIS — F321 Major depressive disorder, single episode, moderate: Secondary | ICD-10-CM

## 2023-06-08 DIAGNOSIS — G20B1 Parkinson's disease with dyskinesia, without mention of fluctuations: Secondary | ICD-10-CM

## 2023-06-08 DIAGNOSIS — R55 Syncope and collapse: Secondary | ICD-10-CM

## 2023-06-08 LAB — CBC WITH DIFFERENTIAL/PLATELET
Basophils Absolute: 0 10*3/uL (ref 0.0–0.1)
Basophils Relative: 0.7 % (ref 0.0–3.0)
Eosinophils Absolute: 0.1 10*3/uL (ref 0.0–0.7)
Eosinophils Relative: 2.8 % (ref 0.0–5.0)
HCT: 37.9 % (ref 36.0–46.0)
Hemoglobin: 12.1 g/dL (ref 12.0–15.0)
Lymphocytes Relative: 41 % (ref 12.0–46.0)
Lymphs Abs: 2.2 10*3/uL (ref 0.7–4.0)
MCHC: 31.9 g/dL (ref 30.0–36.0)
MCV: 88.2 fl (ref 78.0–100.0)
Monocytes Absolute: 0.4 10*3/uL (ref 0.1–1.0)
Monocytes Relative: 7.1 % (ref 3.0–12.0)
Neutro Abs: 2.5 10*3/uL (ref 1.4–7.7)
Neutrophils Relative %: 48.4 % (ref 43.0–77.0)
Platelets: 275 10*3/uL (ref 150.0–400.0)
RBC: 4.3 Mil/uL (ref 3.87–5.11)
RDW: 13.2 % (ref 11.5–15.5)
WBC: 5.3 10*3/uL (ref 4.0–10.5)

## 2023-06-08 LAB — COMPREHENSIVE METABOLIC PANEL
ALT: 3 U/L (ref 0–35)
AST: 13 U/L (ref 0–37)
Albumin: 4.3 g/dL (ref 3.5–5.2)
Alkaline Phosphatase: 85 U/L (ref 39–117)
BUN: 13 mg/dL (ref 6–23)
CO2: 33 mEq/L — ABNORMAL HIGH (ref 19–32)
Calcium: 10.6 mg/dL — ABNORMAL HIGH (ref 8.4–10.5)
Chloride: 100 mEq/L (ref 96–112)
Creatinine, Ser: 0.74 mg/dL (ref 0.40–1.20)
GFR: 79.41 mL/min (ref >60.00)
Glucose, Bld: 93 mg/dL (ref 70–99)
Potassium: 3.7 mEq/L (ref 3.5–5.1)
Sodium: 141 mEq/L (ref 135–145)
Total Bilirubin: 0.4 mg/dL (ref 0.2–1.2)
Total Protein: 7.3 g/dL (ref 6.0–8.3)

## 2023-06-08 LAB — MAGNESIUM: Magnesium: 1.6 mg/dL (ref 1.5–2.5)

## 2023-06-08 NOTE — Patient Instructions (Addendum)
Labs today  We are working on getting you in sooner with cardiology We will order Reclast infusion through [facility] [address] in [place].  Stop nortriptyline for now.

## 2023-06-08 NOTE — Progress Notes (Unsigned)
Ph: [phone] Fax: [phone]   Patient ID: [name], female    DOB: 1946/04/11, 77 y.o.   MRN: [id number]  This visit was conducted in person.  BP 136/76   Pulse 73   Temp 97.6 F (36.4 C) (Temporal)   Ht 5\' 6"  (1.676 m)   Wt 153 lb 6 oz (69.6 kg)   SpO2 97%   BMI 24.76 kg/m   Orthostatic Vitals for the past 48 hrs (Last 6 readings):  Patient Position Orthostatic BP BP Pulse  06/08/23 1053 -- -- 136/76 73  06/08/23 1057 Supine 144/84 -- --  06/08/23 1100 Standing 120/80 -- --   CC: hospital f/u visit  Subjective:   HPI: [name] is a 77 y.o. female presenting on 06/08/2023 for Hospitalization Follow-up (Admitted on 05/30/23 at [facility], dx syncope. Pt accompanied by son, Trey Paula. )   Known history of parkinson's disease s/p DBS (latest reprogramming 05/30/2023), orthostatic hypotension, diastolic CHF, FM, DDD and depression/anxiety.   Hospitalized 05/20/2023 at [facility] in [place] for syncope while seated during church service. See prior note for details.   Re-hospitalized last week for recurrent syncope/collapse. Sent to ER from neurology office after several episodes of syncope and unresponsiveness.  Hospital records reviewed. Concern polypharmacy may have contributed - tramadol, nortriptyline, klonopin. Med rec performed.   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 Overnight EEG 05/31/2023 while hospitalized - generalized excessive beta (WNL)  Low energy since home.  Low appetite. Drinks boost 1 daily.   OP - unable to afford Prolia. Last shot was 08/2022. ***  UTI history: 12/2022 - pansensitive E coli treated with kefiex 7d course 01/2023 - pansensitive E coli treated with keflex 3d course  04/2023 - abnormal UA/micro at [facility] treated with keflex 5d course, UCx not sent  Home health not set up - planning outpatient PT at [facility].  Other follow up appointments scheduled:  *** ______________________________________________________________________ Hospital admission: 05/30/2023 Hospital discharge: 06/02/2023 TCM f/u phone call:  performed on 06/04/2023  Recommendations for Outpatient Follow-up:  Follow up with PCP in 1 week Outpatient follow-up with neurology as previously planned Patient might be at risk for polypharmacy on clonazepam, nortriptyline and tramadol.  Reassess need Check CMP and CBC at follow-up Please follow up on the following pending results: None   Home Health: Ambulatory referral to outpatient rehab Equipment/Devices: Patient has appropriate DME Code Status: Full Code  Problems addressed during this hospitalization Principal Problem:   Syncope and collapse Active Problems:   Anemia   Orthostatic hypotension   GERD   Fibromyalgia   Syncope   CAD (coronary artery disease)   HLD (hyperlipidemia)   DDD (degenerative disc disease), lumbar   MDD (major depressive disorder), single episode, moderate (HCC)   Parkinson's disease   GAD (generalized anxiety disorder)   Mild neurocognitive disorder due to Parkinson's disease   Urinary incontinence     Relevant past medical, surgical, family and social history reviewed and updated as indicated. Interim medical history since our last visit reviewed. Allergies and medications reviewed and updated. Outpatient Medications Prior to Visit  Medication Sig Dispense Refill   acetaminophen (TYLENOL) 500 MG tablet Take 2 tablets (1,000 mg total) by mouth in the morning, at noon, and at bedtime.     AMBULATORY NON FORMULARY MEDICATION Lift chair Dx:  G20 1 Device 0   Calcium Carbonate-Vitamin D (CALCIUM 600/VITAMIN D) 600-400 MG-UNIT chew tablet Chew 1 tablet by mouth  daily.     carbidopa-levodopa (SINEMET IR) 25-100 MG tablet TAKE 1 TABLET BY MOUTH TWO TIMES A DAY AS NEEDED 180 tablet 0   Cholecalciferol (VITAMIN D3) 25 MCG (1000 UT) CAPS Take 1 capsule (1,000 Units total) by mouth daily. 30 capsule     clonazePAM (KLONOPIN) 0.5 MG tablet TAKE 1/2TABS (0.25 MG TOTAL) BY MOUTH EVERY MORNING AND 1 TAB (0.5 MG TOTAL) AT BEDTIME. (Patient taking differently: Take 0.5 mg by mouth at bedtime.) 45 tablet 0   clotrimazole-betamethasone (LOTRISONE) cream Apply 1 application topically daily. 30 g 0   Cranberry-Vitamin C (AZO CRANBERRY URINARY TRACT) 250-60 MG CAPS Take 2 capsules by mouth daily.     cyanocobalamin (VITAMIN B12) 1000 MCG/ML injection INJECT 1 ML (1,000 MCG TOTAL) INTO THE MUSCLE EVERY 30 DAYS. (Patient taking differently: Inject 1,000 mcg into the skin every 30 (thirty) days.) 3 mL 2   denosumab (PROLIA) 60 MG/ML SOSY injection Inject 60 mg into the skin every 6 (six) months.     fludrocortisone (FLORINEF) 0.1 MG tablet TAKE 1 TABLET BY MOUTH EVERY DAY 30 tablet 6   Glycerin-Hypromellose-PEG 400 (ARTIFICIAL TEARS) 0.2-0.2-1 % SOLN Place 1 drop into both eyes daily as needed (dry eyes).     latanoprost (XALATAN) 0.005 % ophthalmic solution Place 1 drop into both eyes at bedtime.     nortriptyline (PAMELOR) 10 MG capsule Take 1 capsule (10 mg total) by mouth at bedtime.     nystatin cream (MYCOSTATIN) Apply 1 Application topically 2 (two) times daily. To affected area/rash 30 g 0   sertraline (ZOLOFT) 100 MG tablet Take 1 tablet (100 mg total) by mouth daily. 100 tablet 3   SYRINGE-NEEDLE, DISP, 3 ML (BD SAFETYGLIDE SYRINGE/NEEDLE) 25G X 1" 3 ML MISC Use to inject vitamin B12 monthly. 50 each 0   traMADol (ULTRAM) 50 MG tablet Take 1 tablet (50 mg total) by mouth every 6 (six) hours as needed for moderate pain. 30 tablet 3   No facility-administered medications prior to visit.     Per HPI unless specifically indicated in ROS section below Review of Systems  Objective:  BP 136/76   Pulse 73   Temp 97.6 F (36.4 C) (Temporal)   Ht 5\' 6"  (1.676 m)   Wt 153 lb 6 oz (69.6 kg)   SpO2 97%   BMI 24.76 kg/m   Wt Readings from Last 3 Encounters:  06/08/23 153 lb 6 oz (69.6 kg)  06/02/23  152 lb 8.9 oz (69.2 kg)  05/30/23 156 lb (70.8 kg)      Physical Exam    Results for orders placed or performed during the hospital encounter of 05/30/23  Basic metabolic panel  Result Value Ref Range   Sodium 136 135 - 145 mmol/L   Potassium 3.8 3.5 - 5.1 mmol/L   Chloride 99 98 - 111 mmol/L   CO2 26 22 - 32 mmol/L   Glucose, Bld 111 (H) 70 - 99 mg/dL   BUN 17 8 - 23 mg/dL   Creatinine, Ser 2.95 0.44 - 1.00 mg/dL   Calcium 62.1 (H) 8.9 - 10.3 mg/dL   GFR, Estimated >30 >86 mL/min   Anion gap 11 5 - 15  CBC  Result Value Ref Range   WBC 5.4 4.0 - 10.5 K/uL   RBC 4.20 3.87 - 5.11 MIL/uL   Hemoglobin 11.9 (L) 12.0 - 15.0 g/dL   HCT 57.8 46.9 - 62.9 %   MCV 89.5 80.0 - 100.0 fL   MCH  28.3 26.0 - 34.0 pg   MCHC 31.6 30.0 - 36.0 g/dL   RDW 16.1 09.6 - 04.5 %   Platelets 259 150 - 400 K/uL   nRBC 0.0 0.0 - 0.2 %  Urinalysis, Routine w reflex microscopic -Urine, Clean Catch  Result Value Ref Range   Color, Urine YELLOW YELLOW   APPearance CLEAR CLEAR   Specific Gravity, Urine 1.009 1.005 - 1.030   pH 7.0 5.0 - 8.0   Glucose, UA NEGATIVE NEGATIVE mg/dL   Hgb urine dipstick NEGATIVE NEGATIVE   Bilirubin Urine NEGATIVE NEGATIVE   Ketones, ur NEGATIVE NEGATIVE mg/dL   Protein, ur NEGATIVE NEGATIVE mg/dL   Nitrite NEGATIVE NEGATIVE   Leukocytes,Ua SMALL (A) NEGATIVE   RBC / HPF 0-5 0 - 5 RBC/hpf   WBC, UA 0-5 0 - 5 WBC/hpf   Bacteria, UA RARE (A) NONE SEEN   Squamous Epithelial / HPF 0-5 0 - 5 /HPF   Mucus PRESENT   Comprehensive metabolic panel  Result Value Ref Range   Sodium 138 135 - 145 mmol/L   Potassium 3.4 (L) 3.5 - 5.1 mmol/L   Chloride 102 98 - 111 mmol/L   CO2 28 22 - 32 mmol/L   Glucose, Bld 107 (H) 70 - 99 mg/dL   BUN 15 8 - 23 mg/dL   Creatinine, Ser 4.09 0.44 - 1.00 mg/dL   Calcium 81.1 8.9 - 91.4 mg/dL   Total Protein 6.1 (L) 6.5 - 8.1 g/dL   Albumin 3.5 3.5 - 5.0 g/dL   AST 14 (L) 15 - 41 U/L   ALT 5 0 - 44 U/L   Alkaline Phosphatase 63 38 - 126 U/L    Total Bilirubin 0.7 0.3 - 1.2 mg/dL   GFR, Estimated >78 >29 mL/min   Anion gap 8 5 - 15  CBC  Result Value Ref Range   WBC 5.6 4.0 - 10.5 K/uL   RBC 3.76 (L) 3.87 - 5.11 MIL/uL   Hemoglobin 10.7 (L) 12.0 - 15.0 g/dL   HCT 56.2 (L) 13.0 - 86.5 %   MCV 87.0 80.0 - 100.0 fL   MCH 28.5 26.0 - 34.0 pg   MCHC 32.7 30.0 - 36.0 g/dL   RDW 78.4 69.6 - 29.5 %   Platelets 232 150 - 400 K/uL   nRBC 0.0 0.0 - 0.2 %  Glucose, capillary  Result Value Ref Range   Glucose-Capillary 110 (H) 70 - 99 mg/dL  Glucose, capillary  Result Value Ref Range   Glucose-Capillary 102 (H) 70 - 99 mg/dL  Renal function panel  Result Value Ref Range   Sodium 138 135 - 145 mmol/L   Potassium 3.7 3.5 - 5.1 mmol/L   Chloride 101 98 - 111 mmol/L   CO2 28 22 - 32 mmol/L   Glucose, Bld 93 70 - 99 mg/dL   BUN 15 8 - 23 mg/dL   Creatinine, Ser 2.84 0.44 - 1.00 mg/dL   Calcium 13.2 8.9 - 44.0 mg/dL   Phosphorus 4.5 2.5 - 4.6 mg/dL   Albumin 3.8 3.5 - 5.0 g/dL   GFR, Estimated >10 >27 mL/min   Anion gap 9 5 - 15  Magnesium  Result Value Ref Range   Magnesium 1.6 (L) 1.7 - 2.4 mg/dL  CBC  Result Value Ref Range   WBC 5.4 4.0 - 10.5 K/uL   RBC 4.28 3.87 - 5.11 MIL/uL   Hemoglobin 11.8 (L) 12.0 - 15.0 g/dL   HCT 25.3 66.4 - 40.3 %  MCV 89.0 80.0 - 100.0 fL   MCH 27.6 26.0 - 34.0 pg   MCHC 31.0 30.0 - 36.0 g/dL   RDW 16.1 09.6 - 04.5 %   Platelets 236 150 - 400 K/uL   nRBC 0.0 0.0 - 0.2 %  Glucose, capillary  Result Value Ref Range   Glucose-Capillary 124 (H) 70 - 99 mg/dL  Glucose, capillary  Result Value Ref Range   Glucose-Capillary 96 70 - 99 mg/dL  Troponin I (High Sensitivity)  Result Value Ref Range   Troponin I (High Sensitivity) 4 <18 ng/L  Troponin I (High Sensitivity)  Result Value Ref Range   Troponin I (High Sensitivity) 5 <18 ng/L   *Note: Due to a large number of results and/or encounters for the requested time period, some results have not been displayed. A complete set of results  can be found in Results Review.    Assessment & Plan:   Problem List Items Addressed This Visit   None    No orders of the defined types were placed in this encounter.   No orders of the defined types were placed in this encounter.   There are no Patient Instructions on file for this visit.  Follow up plan: No follow-ups on file.  Eustaquio Boyden, MD

## 2023-06-08 NOTE — Telephone Encounter (Signed)
New referral placed for cardiology to see if pt can be seen sooner than end of August - she is ok to go to [facility]

## 2023-06-09 ENCOUNTER — Encounter: Payer: Self-pay | Admitting: Family Medicine

## 2023-06-09 HISTORY — DX: Hypercalcemia: E83.52

## 2023-06-09 NOTE — Addendum Note (Signed)
Addended by: Eustaquio Boyden on: 06/09/2023 11:02 AM   Modules accepted: Orders

## 2023-06-09 NOTE — Assessment & Plan Note (Signed)
Appreciate neurology care.  

## 2023-06-09 NOTE — Assessment & Plan Note (Signed)
On sertraline, klonopin dose dropped to 0.5mg  nightly.

## 2023-06-09 NOTE — Assessment & Plan Note (Addendum)
Positive orthostatics in office despite regular florinef use.  This can contribute to syncope. Low dose nortriptyline started by neurology - will hold at this time given ongoing orthostasis, pending cardiology evaluation.

## 2023-06-09 NOTE — Assessment & Plan Note (Addendum)
Last prolia shot 08/2022. Unable to afford rpt prolia.  She was previously on bisphosphonate, intolerant to oral fosamax due to GI upset.  Will order IV Reclast at [facility] center. 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 No upcoming dental work planned.

## 2023-06-09 NOTE — Assessment & Plan Note (Addendum)
Saw neurology, not thought to be due to Parkinson's disease.  Recent hospitalization records reviewed. No signs of seizure in long term overnight EEG.  Recent Zio patch monitor showed runs of SVT which could contribute to syncope.  Will await cardiology recommendations.

## 2023-06-09 NOTE — Assessment & Plan Note (Signed)
Continues sertraline 100mg  daily, has dropped klonopin to 0.5mg  nightly (no further am dose).

## 2023-06-09 NOTE — Assessment & Plan Note (Deleted)
a 

## 2023-06-11 ENCOUNTER — Ambulatory Visit: Payer: Medicare Other | Admitting: Physical Therapy

## 2023-06-11 ENCOUNTER — Telehealth: Payer: Self-pay | Admitting: Pharmacy Technician

## 2023-06-11 NOTE — Telephone Encounter (Signed)
Dr. Sharen Hones, [place] Childes note:  Patient will be scheduled as soon as possible.  Auth Submission: NO AUTH NEEDED Site of care: Site of care: [facility] Payer: [employer] MEDICARE Medication & CPT/J Code(s) submitted: Reclast (Zolendronic acid) [id number] Route of submission (phone, fax, portal):  Phone # Fax # Auth type: Buy/Bill Units/visits requested: X1 Reference number:  Approval from: 06/11/23 to 11/27/23

## 2023-06-11 NOTE — Telephone Encounter (Signed)
Noted! Thank you

## 2023-06-14 NOTE — Telephone Encounter (Signed)
Noted! Thank you

## 2023-06-18 ENCOUNTER — Ambulatory Visit (INDEPENDENT_AMBULATORY_CARE_PROVIDER_SITE_OTHER): Payer: Medicare Other

## 2023-06-18 ENCOUNTER — Other Ambulatory Visit: Payer: Self-pay | Admitting: Family Medicine

## 2023-06-18 VITALS — BP 133/85 | HR 74 | Temp 97.7°F | Resp 18 | Ht 65.5 in | Wt 157.2 lb

## 2023-06-18 DIAGNOSIS — M810 Age-related osteoporosis without current pathological fracture: Secondary | ICD-10-CM | POA: Diagnosis not present

## 2023-06-18 DIAGNOSIS — E538 Deficiency of other specified B group vitamins: Secondary | ICD-10-CM

## 2023-06-18 MED ORDER — ACETAMINOPHEN 325 MG PO TABS
650.0000 mg | ORAL_TABLET | Freq: Once | ORAL | Status: AC
  Administered 2023-06-18: 650 mg via ORAL
  Filled 2023-06-18: qty 2

## 2023-06-18 MED ORDER — ZOLEDRONIC ACID 5 MG/100ML IV SOLN
5.0000 mg | Freq: Once | INTRAVENOUS | Status: AC
  Administered 2023-06-18: 5 mg via INTRAVENOUS
  Filled 2023-06-18: qty 100

## 2023-06-18 MED ORDER — DIPHENHYDRAMINE HCL 25 MG PO CAPS
25.0000 mg | ORAL_CAPSULE | Freq: Once | ORAL | Status: AC
  Administered 2023-06-18: 25 mg via ORAL
  Filled 2023-06-18: qty 1

## 2023-06-18 NOTE — Progress Notes (Signed)
Diagnosis: Osteoporosis  Provider:  Chilton Greathouse MD  Procedure: IV Infusion  IV Type: Peripheral, IV Location: R Forearm  Reclast (Zolendronic Acid), Dose: 5 mg  Infusion Start Time: 1202  Infusion Stop Time: 1300  Post Infusion IV Care: Observation period completed and peripheral IV discontinued  Discharge: Condition: Stable, Destination: Home . AVS Declined  Performed by:  Wyvonne Lenz, RN

## 2023-06-25 ENCOUNTER — Ambulatory Visit: Payer: Medicare Other | Admitting: Physical Therapy

## 2023-06-27 ENCOUNTER — Encounter (INDEPENDENT_AMBULATORY_CARE_PROVIDER_SITE_OTHER): Payer: Self-pay

## 2023-07-11 ENCOUNTER — Encounter: Payer: Self-pay | Admitting: Family Medicine

## 2023-07-11 ENCOUNTER — Ambulatory Visit: Payer: Medicare Other | Admitting: Family Medicine

## 2023-07-11 VITALS — BP 124/66 | HR 70 | Temp 97.5°F | Ht 65.5 in | Wt 156.0 lb

## 2023-07-11 DIAGNOSIS — N390 Urinary tract infection, site not specified: Secondary | ICD-10-CM

## 2023-07-11 DIAGNOSIS — I951 Orthostatic hypotension: Secondary | ICD-10-CM

## 2023-07-11 DIAGNOSIS — M542 Cervicalgia: Secondary | ICD-10-CM | POA: Insufficient documentation

## 2023-07-11 DIAGNOSIS — I38 Endocarditis, valve unspecified: Secondary | ICD-10-CM

## 2023-07-11 DIAGNOSIS — R55 Syncope and collapse: Secondary | ICD-10-CM

## 2023-07-11 DIAGNOSIS — G20B1 Parkinson's disease with dyskinesia, without mention of fluctuations: Secondary | ICD-10-CM | POA: Diagnosis not present

## 2023-07-11 HISTORY — DX: Cervicalgia: M54.2

## 2023-07-11 HISTORY — DX: Endocarditis, valve unspecified: I38

## 2023-07-11 NOTE — Progress Notes (Signed)
Ph: [phone] Fax: [phone]   Patient ID: [name], female    DOB: [birth year], 77 y.o.   MRN: [id number]  This visit was conducted in person.  BP 124/66   Pulse 70   Temp (!) 97.5 F (36.4 C) (Temporal)   Ht 5' 5.5" (1.664 m)   Wt 156 lb (70.8 kg)   SpO2 98%   BMI 25.56 kg/m    CC: 6 mo f/u visit  Subjective:   HPI: [name] is a 77 y.o. female presenting on 07/11/2023 for Medical Management of Chronic Issues (Here for 6 mo f/u. Pt accompanied by son, Trey Paula. )   See prior notes for details.  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 Neurology didn't think PD related. Zio patch showed SVT run which could contribute - pending cardiology eval planned for 07/27/2023. No further sycnopal episodes but she's felt pre-syncopal described as feeling tired and heart racing - lays down when this happens to avoid passing out.   Notes elevated BP readings to 140s systolic associated with HA.  Notes ongoing fatigue.  Per prior note: Concern for neurogenic orthostatic hypotension, r/o cardiac cause - Zio patch placed 05/28/2023. Concern polypharmacy may have contributed - tramadol, nortriptyline, klonopin. Overnight EEG 05/31/2023 while hospitalized - generalized excessive beta (WNL)  OP - received Reclast IV infusion 06/18/2023. Unable to afford Prolia.   Klonopin dose dropped to 0.5mg  only at night.   UTI history: 12/2022 - pansensitive E coli treated with kefiex 7d course 01/2023 - pansensitive E coli treated with keflex 3d course  04/2023 - abnormal UA/micro at [facility] treated with keflex 5d course, UCx not sent  Currently without UTI symptoms.      Relevant past medical, surgical, family and social history reviewed and updated as indicated. Interim medical history since our last visit reviewed. Allergies and medications reviewed and updated. Outpatient Medications Prior to Visit   Medication Sig Dispense Refill   acetaminophen (TYLENOL) 500 MG tablet Take 2 tablets (1,000 mg total) by mouth in the morning, at noon, and at bedtime.     AMBULATORY NON FORMULARY MEDICATION Lift chair Dx:  G20 1 Device 0   Calcium Carbonate-Vitamin D (CALCIUM 600/VITAMIN D) 600-400 MG-UNIT chew tablet Chew 1 tablet by mouth daily.     carbidopa-levodopa (SINEMET IR) 25-100 MG tablet TAKE 1 TABLET BY MOUTH TWO TIMES A DAY AS NEEDED 180 tablet 0   Cholecalciferol (VITAMIN D3) 25 MCG (1000 UT) CAPS Take 1 capsule (1,000 Units total) by mouth daily. 30 capsule    clonazePAM (KLONOPIN) 0.5 MG tablet Take 1 tablet (0.5 mg total) by mouth at bedtime.     clotrimazole-betamethasone (LOTRISONE) cream Apply 1 application topically daily. 30 g 0   Cranberry-Vitamin C (AZO CRANBERRY URINARY TRACT) 250-60 MG CAPS Take 2 capsules by mouth daily.     cyanocobalamin (VITAMIN B12) 1000 MCG/ML injection INJECT 1 ML (1,000 MCG) INTRAMUSCULARLY EVERY 30 DAYS 3 mL 2   fludrocortisone (FLORINEF) 0.1 MG tablet TAKE 1 TABLET BY MOUTH EVERY DAY 30 tablet 6   Glycerin-Hypromellose-PEG 400 (ARTIFICIAL TEARS) 0.2-0.2-1 % SOLN Place 1 drop into both eyes daily as needed (dry eyes).     latanoprost (XALATAN) 0.005 % ophthalmic solution Place 1 drop into both eyes at bedtime.     nystatin cream (MYCOSTATIN) Apply 1 Application topically 2 (two) times daily. To affected area/rash 30 g 0   sertraline (ZOLOFT) 100 MG tablet  Take 1 tablet (100 mg total) by mouth daily. 100 tablet 3   SYRINGE-NEEDLE, DISP, 3 ML (BD SAFETYGLIDE SYRINGE/NEEDLE) 25G X 1" 3 ML MISC Use to inject vitamin B12 monthly. 50 each 0   traMADol (ULTRAM) 50 MG tablet Take 1 tablet (50 mg total) by mouth every 6 (six) hours as needed for moderate pain. 30 tablet 3   zoledronic acid (RECLAST) 5 MG/100ML SOLN injection Inject 100 mLs (5 mg total) into the vein. yearly     No facility-administered medications prior to visit.     Per HPI unless specifically  indicated in ROS section below Review of Systems  Objective:  BP 124/66   Pulse 70   Temp (!) 97.5 F (36.4 C) (Temporal)   Ht 5' 5.5" (1.664 m)   Wt 156 lb (70.8 kg)   SpO2 98%   BMI 25.56 kg/m   Wt Readings from Last 3 Encounters:  07/11/23 156 lb (70.8 kg)  06/18/23 157 lb 3.2 oz (71.3 kg)  06/08/23 153 lb 6 oz (69.6 kg)      Physical Exam Vitals and nursing note reviewed.  Constitutional:      Appearance: Normal appearance. She is not ill-appearing.  HENT:     Mouth/Throat:     Mouth: Mucous membranes are moist.     Pharynx: Oropharynx is clear. No oropharyngeal exudate or posterior oropharyngeal erythema.  Eyes:     Extraocular Movements: Extraocular movements intact.     Pupils: Pupils are equal, round, and reactive to light.  Neck:     Comments:  Limited ROM cervical neck throughout due to discomfort/tightness Discomfort to palpation midline cervical spine and paracervical mm Cardiovascular:     Rate and Rhythm: Normal rate and regular rhythm.     Pulses: Normal pulses.     Heart sounds: Normal heart sounds. No murmur heard. Pulmonary:     Effort: Pulmonary effort is normal. No respiratory distress.     Breath sounds: Normal breath sounds. No wheezing, rhonchi or rales.  Musculoskeletal:     Cervical back: Neck supple. Tenderness present. No rigidity.     Right lower leg: No edema.     Left lower leg: No edema.  Skin:    General: Skin is warm and dry.     Findings: No rash.  Neurological:     Mental Status: She is alert.     Comments: Dyskinesia present  Psychiatric:        Mood and Affect: Mood normal.        Behavior: Behavior normal.       Results for orders placed or performed in visit on 06/08/23  Comprehensive metabolic panel  Result Value Ref Range   Sodium 141 135 - 145 mEq/L   Potassium 3.7 3.5 - 5.1 mEq/L   Chloride 100 96 - 112 mEq/L   CO2 33 (H) 19 - 32 mEq/L   Glucose, Bld 93 70 - 99 mg/dL   BUN 13 6 - 23 mg/dL   Creatinine, Ser  1.61 0.40 - 1.20 mg/dL   Total Bilirubin 0.4 0.2 - 1.2 mg/dL   Alkaline Phosphatase 85 39 - 117 U/L   AST 13 0 - 37 U/L   ALT 3 0 - 35 U/L   Total Protein 7.3 6.0 - 8.3 g/dL   Albumin 4.3 3.5 - 5.2 g/dL   GFR 09.60 >45.40 mL/min   Calcium 10.6 (H) 8.4 - 10.5 mg/dL  CBC with Differential/Platelet  Result Value Ref Range   WBC  5.3 4.0 - 10.5 K/uL   RBC 4.30 3.87 - 5.11 Mil/uL   Hemoglobin 12.1 12.0 - 15.0 g/dL   HCT 16.1 09.6 - 04.5 %   MCV 88.2 78.0 - 100.0 fl   MCHC 31.9 30.0 - 36.0 g/dL   RDW 40.9 81.1 - 91.4 %   Platelets 275.0 150.0 - 400.0 K/uL   Neutrophils Relative % 48.4 43.0 - 77.0 %   Lymphocytes Relative 41.0 12.0 - 46.0 %   Monocytes Relative 7.1 3.0 - 12.0 %   Eosinophils Relative 2.8 0.0 - 5.0 %   Basophils Relative 0.7 0.0 - 3.0 %   Neutro Abs 2.5 1.4 - 7.7 K/uL   Lymphs Abs 2.2 0.7 - 4.0 K/uL   Monocytes Absolute 0.4 0.1 - 1.0 K/uL   Eosinophils Absolute 0.1 0.0 - 0.7 K/uL   Basophils Absolute 0.0 0.0 - 0.1 K/uL  Magnesium  Result Value Ref Range   Magnesium 1.6 1.5 - 2.5 mg/dL   *Note: Due to a large number of results and/or encounters for the requested time period, some results have not been displayed. A complete set of results can be found in Results Review.   Lab Results  Component Value Date   VITAMINB12 605 01/03/2023   Echocardiogram 05/21/2023 at [facility] LVEF 55-60%, normal wall motion, impaired relaxation pattern consistent with age related change, mild MR, tr TR, mild aortic sclerosis without stenosis, mild-mod AR, fair quality   Assessment & Plan:   Problem List Items Addressed This Visit     Parkinson's disease - Primary (Chronic)   Orthostatic hypotension   Recurrent UTI    No current UTI symptoms.      Syncope    No further syncopal episodes but does note ongoing palpitations associated with fatigue - awaiting cardiology eval.       Neck pain    Anticipate cervical neck strain/cervical DDD.  Rec continue scheduled tylenol with  breakthrough tramadol, heating pad, provided with generic neck stretching exercises to try.  Avoid muscle relaxants.       Valvular heart disease    Mild-mod AR and mild MR on recent echocardiogram 04/2023 at [facility].  I don't appreciate significant murmur on exam today.         No orders of the defined types were placed in this encounter.   No orders of the defined types were placed in this encounter.   Patient Instructions  Good to see you today Will await cardiology evaluation.  Try neck stretching exercises provided today  Return in 6 months for physical/wellness visit.  Follow up plan: Return in about 6 months (around 01/11/2024) for annual exam, prior fasting for blood work, medicare wellness visit.  Eustaquio Boyden, MD

## 2023-07-11 NOTE — Patient Instructions (Addendum)
Good to see you today [name] await cardiology evaluation.  Try neck stretching exercises provided today  Return in 6 months for physical/wellness visit.

## 2023-07-11 NOTE — Assessment & Plan Note (Signed)
No current UTI symptoms

## 2023-07-11 NOTE — Assessment & Plan Note (Signed)
No further syncopal episodes but does note ongoing palpitations associated with fatigue - awaiting cardiology eval.

## 2023-07-11 NOTE — Assessment & Plan Note (Addendum)
Anticipate cervical neck strain/cervical DDD.  Rec continue scheduled tylenol with breakthrough tramadol, heating pad, provided with generic neck stretching exercises to try.  Avoid muscle relaxants.

## 2023-07-11 NOTE — Assessment & Plan Note (Signed)
Mild-mod AR and mild MR on recent echocardiogram 04/2023 at [facility].  I don't appreciate significant murmur on exam today.

## 2023-07-16 ENCOUNTER — Other Ambulatory Visit: Payer: Self-pay | Admitting: Family Medicine

## 2023-07-16 DIAGNOSIS — F321 Major depressive disorder, single episode, moderate: Secondary | ICD-10-CM

## 2023-07-18 ENCOUNTER — Encounter: Payer: Self-pay | Admitting: Family Medicine

## 2023-07-18 DIAGNOSIS — F321 Major depressive disorder, single episode, moderate: Secondary | ICD-10-CM

## 2023-07-18 NOTE — Telephone Encounter (Signed)
Last OV:  07/11/23 Last refill: 06/08/23

## 2023-07-19 NOTE — Telephone Encounter (Signed)
Script was sent as historical do not see where given by our office last.   LAST APPOINTMENT DATE: 07/16/2023   NEXT APPOINTMENT DATE: 01/08/2024    LAST REFILL: n/a   QTY:

## 2023-07-20 MED ORDER — CLONAZEPAM 0.5 MG PO TABS
0.5000 mg | ORAL_TABLET | Freq: Every day | ORAL | 3 refills | Status: DC
Start: 2023-07-20 — End: 2023-11-19

## 2023-07-25 DIAGNOSIS — R29898 Other symptoms and signs involving the musculoskeletal system: Secondary | ICD-10-CM

## 2023-07-25 DIAGNOSIS — I509 Heart failure, unspecified: Secondary | ICD-10-CM | POA: Insufficient documentation

## 2023-07-25 DIAGNOSIS — F419 Anxiety disorder, unspecified: Secondary | ICD-10-CM | POA: Insufficient documentation

## 2023-07-25 DIAGNOSIS — Z9889 Other specified postprocedural states: Secondary | ICD-10-CM | POA: Insufficient documentation

## 2023-07-25 DIAGNOSIS — T7840XA Allergy, unspecified, initial encounter: Secondary | ICD-10-CM | POA: Insufficient documentation

## 2023-07-25 DIAGNOSIS — M1990 Unspecified osteoarthritis, unspecified site: Secondary | ICD-10-CM | POA: Insufficient documentation

## 2023-07-25 DIAGNOSIS — G8220 Paraplegia, unspecified: Secondary | ICD-10-CM | POA: Insufficient documentation

## 2023-07-25 DIAGNOSIS — R519 Headache, unspecified: Secondary | ICD-10-CM | POA: Insufficient documentation

## 2023-07-25 DIAGNOSIS — F32A Depression, unspecified: Secondary | ICD-10-CM | POA: Insufficient documentation

## 2023-07-25 DIAGNOSIS — N3010 Interstitial cystitis (chronic) without hematuria: Secondary | ICD-10-CM | POA: Insufficient documentation

## 2023-07-25 DIAGNOSIS — J189 Pneumonia, unspecified organism: Secondary | ICD-10-CM | POA: Insufficient documentation

## 2023-07-25 DIAGNOSIS — T8859XA Other complications of anesthesia, initial encounter: Secondary | ICD-10-CM | POA: Insufficient documentation

## 2023-07-25 DIAGNOSIS — Z5189 Encounter for other specified aftercare: Secondary | ICD-10-CM | POA: Insufficient documentation

## 2023-07-25 DIAGNOSIS — H269 Unspecified cataract: Secondary | ICD-10-CM | POA: Insufficient documentation

## 2023-07-25 HISTORY — DX: Paraplegia, unspecified: G82.20

## 2023-07-25 HISTORY — DX: Other symptoms and signs involving the musculoskeletal system: R29.898

## 2023-07-27 ENCOUNTER — Encounter: Payer: Self-pay | Admitting: Cardiology

## 2023-07-27 ENCOUNTER — Ambulatory Visit: Payer: Medicare Other | Attending: Cardiology | Admitting: Cardiology

## 2023-07-27 ENCOUNTER — Ambulatory Visit: Payer: Medicare Other | Attending: Cardiology

## 2023-07-27 VITALS — BP 130/72 | HR 79 | Ht 65.5 in | Wt 155.0 lb

## 2023-07-27 DIAGNOSIS — R55 Syncope and collapse: Secondary | ICD-10-CM

## 2023-07-27 DIAGNOSIS — I351 Nonrheumatic aortic (valve) insufficiency: Secondary | ICD-10-CM | POA: Diagnosis not present

## 2023-07-27 DIAGNOSIS — I2510 Atherosclerotic heart disease of native coronary artery without angina pectoris: Secondary | ICD-10-CM

## 2023-07-27 DIAGNOSIS — R002 Palpitations: Secondary | ICD-10-CM

## 2023-07-27 NOTE — Patient Instructions (Signed)
Medication Instructions:  Your physician recommends that you continue on your current medications as directed. Please refer to the Current Medication list given to you today.  *If you need a refill on your cardiac medications before your next appointment, please call your pharmacy*   Lab Work: None ordered If you have labs (blood work) drawn today and your tests are completely normal, you will receive your results only by: MyChart Message (if you have MyChart) OR A paper copy in the mail If you have any lab test that is abnormal or we need to change your treatment, we will call you to review the results.   Testing/Procedures: Your physician has recommended that you wear an event monitor. Event monitors are medical devices that record the heart's electrical activity. Doctors most often [place] these monitors to diagnose arrhythmias. Arrhythmias are problems with the speed or rhythm of the heartbeat. The monitor is a small, portable device. You can wear one while you do your normal daily activities. This is usually used to diagnose what is causing palpitations/syncope (passing out). Wear for 1 month   Follow-Up: At [facility], you and your health needs are our priority.  As part of our continuing mission to provide you with exceptional heart care, we have created designated Provider Care Teams.  These Care Teams include your primary Cardiologist (physician) and Advanced Practice Providers (APPs -  Physician Assistants and Nurse Practitioners) who all work together to provide you with the care you need, when you need it.  We recommend signing up for the patient portal called "MyChart".  Sign up information is provided on this After Visit Summary.  MyChart is used to connect with patients for Virtual Visits (Telemedicine).  Patients are able to view lab/test results, encounter notes, upcoming appointments, etc.  Non-urgent messages can be sent to your provider as well.   To learn more about  what you can do with MyChart, go to [email or web address].    Your next appointment:   9 month(s)  The format for your next appointment:   In Person  Provider:   Belva Crome, MD    Other Instructions none  Important Information About Sugar

## 2023-07-27 NOTE — Progress Notes (Signed)
Cardiology Office Note:    Date:  07/27/2023   ID:  [name], DOB 04/17/1946, MRN [id number]  PCP:  Eustaquio Boyden, MD  Cardiologist:  Garwin Brothers, MD   Referring MD: Eustaquio Boyden, MD    ASSESSMENT:    1.  Syncope and collapse 2.  Parkinson's disease 3.  Palpitations  PLAN:    In order of problems listed above:  Primary prevention stressed with the patient.  Importance of compliance with diet medication stressed and patient verbalized standing. Mild to moderate aortic regurgitation: Stable at this time.  We will continue to monitor.  She is asymptomatic. Syncope and collapse with also history of palpitations.  She has undergone 6 day monitoring.  She went to the hospital and her evaluation was unremarkable.  We will do a 1 month monitor to assess her.  I discussed Linq device with her for the future should in case she had repeated episodes and we are not able to figure out what is going on with her.  I do not see any reason from a cardiovascular standpoint at this point for her to pass out.  Her evaluations overall and monitoring in the hospital and out of her to have been unremarkable. Patient will be seen in follow-up appointment in 6 months or earlier if the patient has any concerns.    Medication Adjustments/Labs and Tests Ordered: Current medicines are reviewed at length with the patient today.  Concerns regarding medicines are outlined above.  Orders Placed This Encounter  Procedures   Cardiac event monitor   EKG 12-Lead   No orders of the defined types were placed in this encounter.    History of Present Illness:    [name] is a 77 y.o. female who is being seen today for the evaluation of syncope at the request of Eustaquio Boyden, MD. patient is a pleasant 77 year old female.  She is accompanied by her son.  She has history of Parkinson's disease.  She mentions to me that she has had 2 occasions when she passed out.  The second 1 was she was  at her neurologist office waiting for him in a chair and she passed out.  Son mentions to me that he witnessed this.  She passed out for greater than 5 minutes.  Subsequently she had 2 or 3 such episodes in tandem when she passed out.  No chest pain orthopnea or PND.  Before this she passed out at church and at that point she was told that she had no pulse.  For this reason she was admitted.  At the time of my evaluation, the patient is alert awake oriented and in no distress.  Past Medical History:  Diagnosis Date   Advanced care planning/counseling discussion 05/26/2015   Advanced directive - scanned 07/2021 - wants all children involved in her medical decision, starting with son [name]. Doesn't want prolonged life support if terminal condition however ok with tube feeding.     Allergy    Anemia 09/25/2007   With mild leukopenia, on iron and b12 replacement.  Consider SPEP     Anxiety    Arthritis    Blood transfusion without reported diagnosis    CAD (coronary artery disease) 01/2010   MI, Nishan   Cataract    CHF (congestive heart failure) (HCC)    with episode of sepsis   Chronic cough 07/16/2017   Increased gabapentin to 100 tid from 100 qhs 09/04/2017  Plus max gerd rx   -  MBS 09/11/2017  Ok, rec barium swallow  - DgEs 09/21/2017 >>> Occasional secondary and tertiary contractions in the distal half of the esophagus, but primary peristaltic waves in the esophagus were essentially intact. No appreciable esophageal stricture or ulceration. In borderline slow but successful progress of the    Chronic insomnia 03/07/2016   Chronic, continuous use of opioids 12/07/2013   Closed fracture of rib of right side with routine healing 05/28/2021   Complication of anesthesia    Constipation 08/06/2018   DDD (degenerative disc disease), lumbar 05/31/2013   Lspine 2011 - mild DDD  S/p laminectomy and foraminotomy early 2021 Venetia Maxon)  S/p lumbar laminectomy 04/2022 (Dr Dawley)      Degenerative  lumbar spinal stenosis 11/27/2019   Depression    not recently   Dysphagia 10/09/2017   Dysuria 09/02/2013   Encounter for general adult medical examination with abnormal findings 05/21/2014   Family circumstance 02/23/2015   Fibromyalgia    GAD (generalized anxiety disorder) 04/28/2016   GERD 02/22/2010   Glaucoma 02/2013   [facility]   Headache    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   History of colon polyps 2004   HLD (hyperlipidemia) 05/20/2013   Hypercalcemia 06/09/2023   Hypertension 05/20/2023   HYPOTENSION, ORTHOSTATIC 12/06/2008   Interstitial cystitis    [place] now Dr Logan Bores   Knee pain, bilateral 08/06/2018   Localized swelling of chest wall 12/23/2021   Lumbar radiculopathy, chronic 05/16/2022   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  Prior yearly reclast, prolia started 03/2017  DEXA 10/2019 - T -2.8 hip, -0.1 spine  DEXA 09/2022 - T -3.1 L forearm, -2.6 L femur neck     Last prolia injection: 09/21/2022 - when priced out 03/2023, too expensive for patient. 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   Dr Dimas Millin plastic surgery     Paraparesis (HCC) 07/25/2023   Parkinson's disease 08/25/2015   Dx Dr [name] 07/2015    Pneumonia    PONV (postoperative nausea and vomiting)    Pre-op evaluation 04/26/2022   Rash and other nonspecific skin eruption 05/11/2010   Annotation: thought to be autoimmune---responsive to cellcept  Has had eval at two tertiary centers  Rash is responsive to  cellcept     Recurrent UTI 01/03/2011   UTI history:  12/2022 - pansensitive E coli treated with kefiex 7d course  01/2023 - pansensitive E coli treated with keflex 3d course   04/2023 - abnormal UA/micro at [facility] treated with keflex 5d course, UCx not sent     Right leg weakness 07/25/2023   Right lumbar radiculopathy 04/10/2018   MRI Lumbar spine 10/2018:   Multilevel DDD and facet degeneration with some resultant spinal and foraminal stenosis most prominent L5/S1 with R protrusion impinging on L5 nerve root, shallow central protrusion.   Saw [facility] Shon Baton) referred to Dr Ethelene Hal [facility] 11/2018  LESI x3 with mild benefit 12/2018  S/p laminectomy/foraminotomy 2021 Venetia Maxon)  S/p lumbar laminectomy 04/2022 (Dr Dawley)      Syncope 01/03/2011  Zio patch 05/2023:  HR 58 - 171 bpm, average 78 bpm.  7 nonsustained SVT, longest 6 beats.  Rare supraventricular and ventricular ectopy.  No sustained arrhythmias.  No atrial fibrillation.     Takotsubo cardiomyopathy 2008   due to E coli urosepsis   Urinary incontinence 01/12/2023   Valvular heart disease 07/11/2023   Echocardiogram 05/21/2023 at [facility]  LVEF 55-60%, normal wall motion, impaired relaxation pattern consistent with age related change, mild MR, tr TR, mild aortic sclerosis without stenosis, mild-mod AR, fair quality      Visual changes 05/20/2023   Vitamin B12 deficiency 05/03/2007   IF negative (02/2017)     Vitamin D deficiency 05/09/2013    Past Surgical History:  Procedure Laterality Date   ABDOMINAL HYSTERECTOMY  1970s   IUD infection - first partial then with oophorectomy (cysts), complication - low blood pressure   CATARACT EXTRACTION Bilateral    CHOLECYSTECTOMY     complication - low blood pressure   COLONOSCOPY  06/2008   h/o polyps but latest WNL, rec rpt 10 yrs Juanda Chance)   COLONOSCOPY  11/2018   multiple TAs (10 polyps total), rpt 2 yrs Russella Dar)   COLONOSCOPY  04/2021   multiple TAs, rpt 3 yrs Russella Dar)   CYSTOSCOPY   12/2013   abx treatment for recurrent cystitis   DEXA  04/2013   T -2.9 @ femur, -1.6 @ spine   DEXA  04/2017   T -2.9 hip, -0.7 spine   ESOPHAGOGASTRODUODENOSCOPY  12/2017   WNL, regardless esophagus dilated, small HH Russella Dar)   EYE SURGERY     LUMBAR LAMINECTOMY/DECOMPRESSION MICRODISCECTOMY Right 11/27/2019   Right Lumbar Four-Five foraminotomy;  Maeola Harman, MD)   LUMBAR LAMINECTOMY/DECOMPRESSION MICRODISCECTOMY Right 05/16/2022   Procedure: OPEN LUMBAR LAMINECTOMY, RT, L45 W/LATERAL RECESS DECOMPRESSION;  Surgeon: Bethann Goo, DO;  Location: [facility] OR;  Service: Neurosurgery;  Laterality: Right;  3C   MINOR PLACEMENT OF FIDUCIAL N/A 06/30/2021   Procedure: MINOR PLACEMENT OF FIDUCIAL;  Surgeon: Maeola Harman, MD;  Location: [facility] OR;  Service: Neurosurgery;  Laterality: N/A;  Minor room   PTOSIS REPAIR Bilateral 10/2020   Plastic Surgery   PULSE GENERATOR IMPLANT Right 07/14/2021   Procedure: UNILATERAL PULSE GENERATOR IMPLANT;  Surgeon: Maeola Harman, MD;  Location: [facility] OR;  Service: Neurosurgery;  Laterality: Right;   SUBTHALAMIC STIMULATOR INSERTION Bilateral 07/07/2021   Procedure: Deep brain stimulator placement;  Surgeon: Maeola Harman, MD;  Location: [facility] OR;  Service: Neurosurgery;  Laterality: Bilateral;   TUBAL LIGATION     UPPER GASTROINTESTINAL ENDOSCOPY      Current Medications: Current Meds  Medication Sig   acetaminophen (TYLENOL) 500 MG tablet Take 2 tablets (1,000 mg total) by mouth in the morning, at noon, and at bedtime.   AMBULATORY NON FORMULARY MEDICATION Lift chair Dx:  G20   Calcium Carbonate-Vitamin D (CALCIUM 600/VITAMIN D) 600-400 MG-UNIT chew tablet Chew 1 tablet by mouth daily.   carbidopa-levodopa (SINEMET IR) 25-100 MG tablet TAKE 1 TABLET BY MOUTH TWO TIMES A DAY AS NEEDED   Cholecalciferol (VITAMIN D3) 25 MCG (1000 UT) CAPS Take 1 capsule (1,000 Units total) by mouth daily.   clonazePAM (KLONOPIN) 0.5 MG tablet Take 1 tablet (0.5 mg total) by mouth at  bedtime.   clotrimazole-betamethasone (LOTRISONE) cream Apply 1 application topically daily.   Cranberry-Vitamin C (AZO CRANBERRY URINARY TRACT) 250-60 MG CAPS Take 2 capsules by mouth daily.   cyanocobalamin (VITAMIN B12) 1000 MCG/ML injection INJECT 1 ML (1,000 MCG) INTRAMUSCULARLY EVERY  30 DAYS   fludrocortisone (FLORINEF) 0.1 MG tablet TAKE 1 TABLET BY MOUTH EVERY DAY   Glycerin-Hypromellose-PEG 400 (ARTIFICIAL TEARS) 0.2-0.2-1 % SOLN Place 1 drop into both eyes daily as needed (dry eyes).   latanoprost (XALATAN) 0.005 % ophthalmic solution Place 1 drop into both eyes at bedtime.   nystatin cream (MYCOSTATIN) Apply 1 Application topically 2 (two) times daily. To affected area/rash   sertraline (ZOLOFT) 100 MG tablet Take 1 tablet (100 mg total) by mouth daily.   SYRINGE-NEEDLE, DISP, 3 ML (BD SAFETYGLIDE SYRINGE/NEEDLE) 25G X 1" 3 ML MISC Use to inject vitamin B12 monthly.   traMADol (ULTRAM) 50 MG tablet Take 1 tablet (50 mg total) by mouth every 6 (six) hours as needed for moderate pain.   zoledronic acid (RECLAST) 5 MG/100ML SOLN injection Inject 100 mLs (5 mg total) into the vein. yearly     Allergies:   Iohexol, Amitriptyline, Ciprofloxacin, Cymbalta [duloxetine hcl], Lyrica [pregabalin], Imipramine hcl, Iodine, Lidocaine hcl, Morphine sulfate, Neosporin [neomycin-bacitracin zn-polymyx], Sulfamethoxazole, and Tetracyclines & related   Social History   Socioeconomic History   Marital status: Widowed    Spouse name: Not on file   Number of children: 4   Years of education: Not on file   Highest education level: 10th grade  Occupational History   Occupation: retired  Tobacco Use   Smoking status: Never   Smokeless tobacco: Never  Vaping Use   Vaping status: Never Used  Substance and Sexual Activity   Alcohol use: No   Drug use: Not Currently   Sexual activity: Never  Other Topics Concern   Not on file  Social History Narrative   Widow - husband [name]) passed away 2013-08-17.      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, theatre/television/film   Activity: limited by back pain   Right Handed   Social Determinants of Health   Financial Resource Strain: Patient Declined (02/19/2023)   Overall Financial Resource Strain (CARDIA)    Difficulty of Paying Living Expenses: Patient declined  Food Insecurity: No Food Insecurity (06/04/2023)   Hunger Vital Sign    Worried About Running Out of Food in the Last Year: Never true    Ran Out of Food in the Last Year: Never true  Transportation Needs: No Transportation Needs (06/04/2023)   PRAPARE - Administrator, Civil Service (Medical): No    Lack of Transportation (Non-Medical): No  Physical Activity: Unknown (02/19/2023)   Exercise Vital Sign    Days of Exercise per Week: Patient declined    Minutes of Exercise per Session: Not on file  Stress: No Stress Concern Present (05/20/2023)   Received from [facility], [facility] - Occupational Stress Questionnaire    Feeling of Stress : Not at all  Social Connections: Unknown (05/20/2023)   Received from [facility], [facility]   Social Network    Social Network: Not on file     Family History: The patient's family history includes Breast cancer in her sister; CAD in her brother; Clotting disorder in her son; Diabetes in her father; Esophageal cancer in her mother; Healthy in her daughter, daughter, and son; Heart attack in her father; Lung cancer in her brother and brother; Ovarian cancer in her sister; Prostate cancer in her father; Uterine cancer in her sister. There is no  history of Colon cancer, Rectal cancer, or Stomach cancer.  ROS:   Please see the history of present illness.    All other systems reviewed and are negative.  EKGs/Labs/Other Studies Reviewed:    The following studies were reviewed  today:  EKG Interpretation Date/Time:  Friday July 27 2023 13:55:47 EDT Ventricular Rate:  79 PR Interval:  196 QRS Duration:  84 QT Interval:  378 QTC Calculation: 433 R Axis:   -13  Text Interpretation: Normal sinus rhythm Normal ECG When compared with ECG of 30-May-2023 10:06, PREVIOUS ECG IS PRESENT Confirmed by Belva Crome [phone]) on 07/27/2023 2:11:38 PM     Recent Labs: 06/08/2023: ALT 3; BUN 13; Creatinine, Ser 0.74; Hemoglobin 12.1; Magnesium 1.6; Platelets 275.0; Potassium 3.7; Sodium 141  Recent Lipid Panel    Component Value Date/Time   CHOL 227 (H) 01/03/2023 0846   TRIG 109.0 01/03/2023 0846   HDL 58.10 01/03/2023 0846   CHOLHDL 4 01/03/2023 0846   VLDL 21.8 01/03/2023 0846   LDLCALC 147 (H) 01/03/2023 0846   LDLDIRECT 105.5 07/20/2010 1146    Physical Exam:    VS:  BP 130/72 (BP Location: Right Arm, Patient Position: Sitting, Cuff Size: Normal)   Pulse 79   Ht 5' 5.5" (1.664 m)   Wt 155 lb (70.3 kg)   SpO2 97%   BMI 25.40 kg/m     Wt Readings from Last 3 Encounters:  07/27/23 155 lb (70.3 kg)  07/11/23 156 lb (70.8 kg)  06/18/23 157 lb 3.2 oz (71.3 kg)     GEN: Patient is in no acute distress HEENT: Normal NECK: No JVD; No carotid bruits LYMPHATICS: No lymphadenopathy CARDIAC: S1 S2 regular, 2/6 systolic murmur at the apex. RESPIRATORY:  Clear to auscultation without rales, wheezing or rhonchi  ABDOMEN: Soft, non-tender, non-distended MUSCULOSKELETAL:  No edema; No deformity  SKIN: Warm and dry NEUROLOGIC:  Alert and oriented x 3 PSYCHIATRIC:  Normal affect    Signed, Garwin Brothers, MD  07/27/2023 2:29 PM    [facility]

## 2023-08-02 ENCOUNTER — Other Ambulatory Visit: Payer: Self-pay | Admitting: Neurology

## 2023-08-15 NOTE — Progress Notes (Unsigned)
Assessment/Plan:    1.  Parkinsons Disease             -Patient completed neurocognitive testing on January 25, 2021 with Dr. Roseanne Reno.  Evidence of MCI only.  -Patient is status post bilateral STN DBS on July 07, 2021 with IPG placement on July 14, 2021.    -Increase carbidopa/levodopa 25/100 to 1 at 9am/1pm/6pm  -add carbidopa/levodopa 50/200 at bed as having trouble with first AM on  -Long discussion about the fact that she needs a walker at all times.  -Referred to physical therapy.  -some discomfort when hits scalp wire at night.  She did not tolerate gabapentin, pamelor or Lyrica.  On klonopin currently  2.  Dyskinesia             -none today but I am increasing med and will need to monitor 3.  Neurogenic Orthostatic Hypotension             -Did well on Northera.  Patient stopped that on her own.  Now on Florinef 0.1 mg daily 4.  Depression             -On sertraline.  Doing well. 5.  b12 deficiency             -on injections. 6.  Urinary incontinence             -on myrbetriq.  She is doing well in that regard 7.  Lumbar radiculopathy             -She is status post lumbar foraminotomy with Dr. Venetia Maxon on November 27, 2019.  Pain has reemerged and she underwent L4-L5 laminectomy with Dr. Jake Samples on May 16, 2022. 8.  Depression  -On sertraline, 100 mg daily. 9.  Dysphagia  -MBE done in June, 2023 with mild oropharyngeal dysphagia.  Regular diet with thin liquids recommended. 10.  History of multiple syncopal episodes, clustering, and occurring in any position (laying included)  -Extensive cardiac and neurowork-up has been unremarkable thus far, including longterm EEG monitoring in hospital  -Cardiology recommending 1 month monitor, and has talked to her about loop recorder in the future  -Functional episodes cannot be ruled out (personally witnessed episodes).  Subjective:   [name] was seen today in follow up for levodopa challenge.  My previous records were  reviewed prior to todays visit as well as outside records available to me.  Patient was hospitalized the day of her last visit.  Extensive workup has been undertaken over the course of time.  She had a workup at [facility], including TTE, routine EEG and carotid ultrasound that were unremarkable.  She had a Zio patch on during the last many syncopal episodes, which was unrevealing.  She had prolonged EEG monitoring in the hospital and that was negative.  She saw cardiology on August 30 as an outpatient and it was recommended that they do a 1 month monitor and also discussed a loop recorder for the future.  She has 1 week left on her heart monitor.  She is off balance but no further syncopal episodes.  Current prescribed movement disorder medications: Carbidopa/levodopa 25/100, 1 tablet twice per day (6am/6pm)   PREVIOUS MEDICATIONS: northera (worked well but pt d/c when they quit sending b/c she owed $); amantadine (discontinued because of hallucinations); pramipexole (decreased in past because of hallucinations/dyskinesia); clonazepam (stopped it because of fatigue, but also because she just did not need it any longer); lyrica; pamelor (n/v)  ALLERGIES:  Allergies  Allergen Reactions   Iohexol Hives and Shortness Of Breath     Code: HIVES, Desc: PT developed 2 hives, followed by SOB, severe headache post 87cc's Omnipaque 300., Onset Date: [id number]    Amitriptyline Other (See Comments)    Sedated next morning   Ciprofloxacin Nausea And Vomiting   Cymbalta [Duloxetine Hcl] Other (See Comments)    Worsened depression   Lyrica [Pregabalin] Other (See Comments)    Tried during hospitalization - unsure effects but unable to tolerate   Imipramine Hcl Rash   Iodine Rash   Lidocaine Hcl Rash   Morphine Sulfate Rash   Neosporin [Neomycin-Bacitracin Zn-Polymyx] Rash and Other (See Comments)    Worsened skin breaking out   Sulfamethoxazole Rash   Tetracyclines & Related Rash    CURRENT MEDICATIONS:   Outpatient Encounter Medications as of 08/16/2023  Medication Sig   acetaminophen (TYLENOL) 500 MG tablet Take 2 tablets (1,000 mg total) by mouth in the morning, at noon, and at bedtime.   AMBULATORY NON FORMULARY MEDICATION Lift chair Dx:  G20   Calcium Carbonate-Vitamin D (CALCIUM 600/VITAMIN D) 600-400 MG-UNIT chew tablet Chew 1 tablet by mouth daily.   carbidopa-levodopa (SINEMET IR) 25-100 MG tablet TAKE 1 TABLET BY MOUTH TWO TIMES A DAY AS NEEDED   Cholecalciferol (VITAMIN D3) 25 MCG (1000 UT) CAPS Take 1 capsule (1,000 Units total) by mouth daily.   clonazePAM (KLONOPIN) 0.5 MG tablet Take 1 tablet (0.5 mg total) by mouth at bedtime.   clotrimazole-betamethasone (LOTRISONE) cream Apply 1 application topically daily.   Cranberry-Vitamin C (AZO CRANBERRY URINARY TRACT) 250-60 MG CAPS Take 2 capsules by mouth daily.   cyanocobalamin (VITAMIN B12) 1000 MCG/ML injection INJECT 1 ML (1,000 MCG) INTRAMUSCULARLY EVERY 30 DAYS   fludrocortisone (FLORINEF) 0.1 MG tablet TAKE 1 TABLET BY MOUTH EVERY DAY   Glycerin-Hypromellose-PEG 400 (ARTIFICIAL TEARS) 0.2-0.2-1 % SOLN Place 1 drop into both eyes daily as needed (dry eyes).   latanoprost (XALATAN) 0.005 % ophthalmic solution Place 1 drop into both eyes at bedtime.   nystatin cream (MYCOSTATIN) Apply 1 Application topically 2 (two) times daily. To affected area/rash   sertraline (ZOLOFT) 100 MG tablet Take 1 tablet (100 mg total) by mouth daily.   SYRINGE-NEEDLE, DISP, 3 ML (BD SAFETYGLIDE SYRINGE/NEEDLE) 25G X 1" 3 ML MISC Use to inject vitamin B12 monthly.   traMADol (ULTRAM) 50 MG tablet Take 1 tablet (50 mg total) by mouth every 6 (six) hours as needed for moderate pain.   zoledronic acid (RECLAST) 5 MG/100ML SOLN injection Inject 100 mLs (5 mg total) into the vein. yearly   No facility-administered encounter medications on file as of 08/16/2023.    Objective:   PHYSICAL EXAMINATION:    VITALS:   Vitals:   08/16/23 1317  BP: 122/72   Pulse: 73  SpO2: 98%  Weight: 156 lb 12.8 oz (71.1 kg)  Height: 5\' 6"  (1.676 m)     GEN:  The patient appears stated age and is in NAD. HEENT:  Normocephalic, atraumatic.  The mucous membranes are moist. The superficial temporal arteries are without ropiness or tenderness. CV:  RRR Lungs:  CTAB Neck/HEME:  There are no carotid bruits bilaterally.  Neurological examination:  Orientation: The patient is alert and oriented x3. Cranial nerves: There is chronic L facial droop.  Smile is symmetric. The speech is fluent and hypophonic and occasionally dysarthric.  This is her baseline.  Soft palate rises symmetrically and there is no tongue deviation. Hearing is intact  to conversational tone. Sensation: Sensation is intact to light touch throughout Motor: Strength is at least antigravity x4.  Movement examination: Tone: There is normal tone in the upper and lower extremities before and after programming Abnormal movements: No dyskinesia today.  Rare, intermittent tremor of the right upper extremity Coordination:  There is mild decremation on the R and with toe taps bilaterally Gait and Station: She is slow to arise.  She has start hesitation.  She is slow and somewhat unstable, particularly in the turns.       Chemistry      Component Value Date/Time   NA 141 06/08/2023 1134   K 3.7 06/08/2023 1134   CL 100 06/08/2023 1134   CO2 33 (H) 06/08/2023 1134   BUN 13 06/08/2023 1134   CREATININE 0.74 06/08/2023 1134   CREATININE 0.87 10/08/2019 0921      Component Value Date/Time   CALCIUM 10.6 (H) 06/08/2023 1134   ALKPHOS 85 06/08/2023 1134   AST 13 06/08/2023 1134   ALT 3 06/08/2023 1134   BILITOT 0.4 06/08/2023 1134       Lab Results  Component Value Date   WBC 5.3 06/08/2023   HGB 12.1 06/08/2023   HCT 37.9 06/08/2023   MCV 88.2 06/08/2023   PLT 275.0 06/08/2023    Lab Results  Component Value Date   TSH 0.49 10/04/2018    Total time spent on today's visit was  40 minutes, including both face-to-face time and nonface-to-face time.  Time included that spent on review of records (prior notes available to me/labs/imaging if pertinent), discussing treatment and goals, answering patient's questions and coordinating care.  This did not include DBS time.     Cc:  Eustaquio Boyden, MD

## 2023-08-16 ENCOUNTER — Ambulatory Visit: Payer: Medicare Other | Admitting: Neurology

## 2023-08-16 VITALS — BP 122/72 | HR 73 | Ht 66.0 in | Wt 156.8 lb

## 2023-08-16 DIAGNOSIS — Z9689 Presence of other specified functional implants: Secondary | ICD-10-CM | POA: Diagnosis not present

## 2023-08-16 DIAGNOSIS — G20B2 Parkinson's disease with dyskinesia, with fluctuations: Secondary | ICD-10-CM

## 2023-08-16 DIAGNOSIS — R55 Syncope and collapse: Secondary | ICD-10-CM | POA: Diagnosis not present

## 2023-08-16 MED ORDER — CARBIDOPA-LEVODOPA ER 50-200 MG PO TBCR: 1.0000 | EXTENDED_RELEASE_TABLET | Freq: Every day | ORAL | 1 refills | Status: DC

## 2023-08-16 MED ORDER — CARBIDOPA-LEVODOPA 25-100 MG PO TABS: 1.0000 | ORAL_TABLET | Freq: Three times a day (TID) | ORAL | 1 refills | Status: DC

## 2023-08-16 NOTE — Patient Instructions (Addendum)
Take carbidopa/levodopa 25/100, 1 at 9am/1pm/5pm ADD carbidopa/levodopa 50/200 ER at bed Walker at ALL times!  SAVE THE DATE!  We are planning a Parkinsons Disease educational symposium at [facility] in [place] on October 11.  More details to come!  If you would like to be added to our email list to get further information, email [email or web address].  To sign up, you can email [email or web address].  We hope to see you there!   Constipation and Parkinson's disease:  1.Rancho recipe for constipation in Parkinsons Disease:  -1 cup of unprocessed bran (need to get this at [employer], [employer] or similar type of store), 2 cups of applesauce in 1 cup of prune juice 2.  Increase fiber intake (Metamucil,vegetables) 3.  Regular, moderate exercise can be beneficial. 4.  Avoid medications causing constipation, such as medications like antacids with calcium or magnesium 5.  It's okay to take daily Miralax, and taper if stools become too loose or you experience diarrhea 6.  Stool softeners (Colace) can help with chronic constipation and I recommend you take this daily. 7.  Increase water intake.  You should be drinking 1/2 gallon of water a day as long as you have not been diagnosed with congestive heart failure or renal/kidney failure.  This is probably the single greatest thing that you can do to help your constipation.

## 2023-08-16 NOTE — Procedures (Signed)
DBS Programming was performed.    Manufacturer of DBS device: [employer], bluetooth  Total time spent programming was 10 minutes.  Device was confirmed to be on.  Soft start was confirmed to be on.  Impedences were checked and were within normal limits.  Battery was checked and was determined to be functioning normally and not near the end of life.  Final settings were as follows:    Active Contact Amplitude (mA) PW (ms) Frequency (hz) Side Effects  Left Brain       08/08/21 3-C+ 1.7 60 130   Other trials        1-C+ 2.7 60 130 tremor   (2/3/4)-C+ 3.0 60 130 Face pull above 2.1   (5/6/7)-C+ 1.8 60 130 ? Face pull (some baseline facial asymmetry)  08/22/21 5-(25%)7-(75%)C+ 2.5 60 159   09/05/21 final 5-(25%)6-(75%)C+ 2.9 60 159   Other trials 5-(50%)6-(50%)C+ 2.9 60 159    5-(25%)7-(75%)C+ 3.5 60 159 Tremor not controlled  10/31/21 5-(25%)7-(75%)C+ 3.3 60 159   05/01/22 5-(25%)7-(75%)C+ 3.6 60 159 Transient lip tingle  08/11/22 5-(25%)7-(75%)C+ 3.7 60 159   09/25/22 6-(25%)7-(75%)C+ 3.7 60 159   03/16/23 3c(25%)3b(75%)C+ 3.7 60 159   08/16/23 3c(25%)3b(75%)C+ 3.6 60 159                 Right Brain       08/08/21 (5/6/7)-C+ 1.6 60 130   Other trials 1-C+ 1.5 60 130 ? Speech change   (2/3/4)-C+ 1.5 60 130 Pretty good   8-C+ 1.6 60 130 tremor  08/22/21 8-C+ 2.7 60 136   09/05/21 final 6-(50%)8-(50%)C+ 2.9 60 159   others 6-(60%)8-(40%)C+ 2.8  60 136 Mouth dyskinesia  10/31/21 6-(50%)8-(50%)C+ 3.4 60 159   05/01/22 6-(50%)8-(50%)C+ 3.6 60 159   08/11/22 6-(50%)8-(50%)C+ 3.5 60 159   09/25/22 6-(50%)8-(50%)C+ 3.5 60 159   03/16/23 3b(50%)4(50%)C+ 3.5 60 159   08/16/23 3b(50%)4(50%)C+ 3.5 60 159

## 2023-08-26 ENCOUNTER — Other Ambulatory Visit: Payer: Self-pay | Admitting: Family Medicine

## 2023-09-20 ENCOUNTER — Ambulatory Visit: Payer: Medicare Other | Attending: Neurology | Admitting: Physical Therapy

## 2023-09-20 ENCOUNTER — Encounter: Payer: Self-pay | Admitting: Physical Therapy

## 2023-09-20 DIAGNOSIS — R2681 Unsteadiness on feet: Secondary | ICD-10-CM | POA: Insufficient documentation

## 2023-09-20 DIAGNOSIS — R262 Difficulty in walking, not elsewhere classified: Secondary | ICD-10-CM | POA: Diagnosis not present

## 2023-09-20 DIAGNOSIS — M6281 Muscle weakness (generalized): Secondary | ICD-10-CM | POA: Diagnosis not present

## 2023-09-20 DIAGNOSIS — G20B2 Parkinson's disease with dyskinesia, with fluctuations: Secondary | ICD-10-CM | POA: Diagnosis not present

## 2023-09-20 DIAGNOSIS — G20B1 Parkinson's disease with dyskinesia, without mention of fluctuations: Secondary | ICD-10-CM | POA: Insufficient documentation

## 2023-09-20 NOTE — Therapy (Signed)
OUTPATIENT PHYSICAL THERAPY NEURO EVALUATION   Patient Name: [name] MRN: [id number] DOB:May 11, 1946, 77 y.o., female Today's Date: 09/20/2023   PCP: Lydia Guiles PROVIDER: [name]   PT End of Session - 09/20/23 1309     Visit Number 1    Date for PT Re-Evaluation 11/27/23    Authorization Type UHC Medicare    PT Start Time 1307    PT Stop Time 1400    PT Time Calculation (min) 53 min    Activity Tolerance Patient tolerated treatment well    Behavior During Therapy [facility] for tasks assessed/performed             Past Medical History:  Diagnosis Date   Advanced care planning/counseling discussion 05/26/2015   Advanced directive - scanned 07/2021 - wants all children involved in her medical decision, starting with son [name]. Doesn't want prolonged life support if terminal condition however ok with tube feeding.     Allergy    Anemia 09/25/2007   With mild leukopenia, on iron and b12 replacement.  Consider SPEP     Anxiety    Arthritis    Blood transfusion without reported diagnosis    CAD (coronary artery disease) 01/2010   MI, Nishan   Cataract    CHF (congestive heart failure) (HCC)    with episode of sepsis   Chronic cough 07/16/2017   Increased gabapentin to 100 tid from 100 qhs 09/04/2017  Plus max gerd rx   -  MBS 09/11/2017  Ok, rec barium swallow  - DgEs 09/21/2017 >>> Occasional secondary and tertiary contractions in the distal half of the esophagus, but primary peristaltic waves in the esophagus were essentially intact. No appreciable esophageal stricture or ulceration. In borderline slow but successful progress of the    Chronic insomnia 03/07/2016   Chronic, continuous use of opioids 12/07/2013   Closed fracture of rib of right side with routine healing 05/28/2021   Complication of anesthesia    Constipation 08/06/2018   DDD (degenerative disc disease), lumbar 05/31/2013   Lspine 2011 - mild DDD  S/p laminectomy and foraminotomy early 2021 Venetia Maxon)   S/p lumbar laminectomy 04/2022 (Dr Dawley)      Degenerative lumbar spinal stenosis 11/27/2019   Depression    not recently   Dysphagia 10/09/2017   Dysuria 09/02/2013   Encounter for general adult medical examination with abnormal findings 05/21/2014   Family circumstance 02/23/2015   Fibromyalgia    GAD (generalized anxiety disorder) 04/28/2016   GERD 02/22/2010   Glaucoma 02/2013   [facility]   Headache    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   History of colon polyps 2004   HLD (hyperlipidemia) 05/20/2013   Hypercalcemia 06/09/2023   Hypertension 05/20/2023   HYPOTENSION, ORTHOSTATIC 12/06/2008   Interstitial cystitis    Ottelin now Dr Logan Bores   Knee pain, bilateral 08/06/2018   Localized swelling of chest wall 12/23/2021   Lumbar radiculopathy, chronic 05/16/2022   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/08/2010   MDD (major depressive disorder), single episode, moderate (HCC) 11/12/2013   Medicare annual wellness visit, subsequent 05/20/2013   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  Prior yearly reclast, prolia started 03/2017  DEXA 10/2019 - T -2.8 hip, -0.1 spine  DEXA 09/2022 -  T -3.1 L forearm, -2.6 L femur neck     Last prolia injection: 09/21/2022 - when priced out 03/2023, too expensive for patient. 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   Dr Dimas Millin plastic surgery     Paraparesis (HCC) 07/25/2023   Parkinson's disease (HCC) 08/25/2015   Dx Dr [name] 07/2015    Pneumonia    PONV (postoperative nausea and vomiting)    Pre-op evaluation 04/26/2022   Rash and other nonspecific skin eruption 05/11/2010   Annotation: thought to be autoimmune---responsive  to cellcept  Has had eval at two tertiary centers  Rash is responsive to cellcept     Recurrent UTI 01/03/2011   UTI history:  12/2022 - pansensitive E coli treated with kefiex 7d course  01/2023 - pansensitive E coli treated with keflex 3d course   04/2023 - abnormal UA/micro at [facility] treated with keflex 5d course, UCx not sent     Right leg weakness 07/25/2023   Right lumbar radiculopathy 04/10/2018   MRI Lumbar spine 10/2018:   Multilevel DDD and facet degeneration with some resultant spinal and foraminal stenosis most prominent L5/S1 with R protrusion impinging on L5 nerve root, shallow central protrusion.   Saw [facility] Shon Baton) referred to Dr Ethelene Hal PMR 11/2018  LESI x3 with mild benefit 12/2018  S/p laminectomy/foraminotomy 2021 Venetia Maxon)  S/p lumbar laminectomy 04/2022 (Dr Dawley)      Syncope 01/03/2011   Zio patch 05/2023:  HR 58 - 171 bpm, average 78 bpm.  7 nonsustained SVT, longest 6 beats.  Rare supraventricular and ventricular ectopy.  No sustained arrhythmias.  No atrial fibrillation.     Takotsubo cardiomyopathy 2008   due to E coli urosepsis   Urinary incontinence 01/12/2023   Valvular heart disease 07/11/2023   Echocardiogram 05/21/2023 at [facility]  LVEF 55-60%, normal wall motion, impaired relaxation pattern consistent with age related change, mild MR, tr TR, mild aortic sclerosis without stenosis, mild-mod AR, fair quality      Visual changes 05/20/2023   Vitamin B12 deficiency 05/03/2007   IF negative (02/2017)     Vitamin D deficiency 05/09/2013   Past Surgical History:  Procedure Laterality Date   ABDOMINAL HYSTERECTOMY  1970s   IUD infection - first partial then with oophorectomy (cysts), complication - low blood pressure   CATARACT EXTRACTION Bilateral    CHOLECYSTECTOMY     complication - low blood pressure   COLONOSCOPY  06/2008   h/o polyps but latest WNL, rec rpt 10 yrs Juanda Chance)   COLONOSCOPY  11/2018   multiple TAs (10 polyps total), rpt 2 yrs Russella Dar)    COLONOSCOPY  04/2021   multiple TAs, rpt 3 yrs Russella Dar)   CYSTOSCOPY  12/2013   abx treatment for recurrent cystitis   DEXA  04/2013   T -2.9 @ femur, -1.6 @ spine   DEXA  04/2017   T -2.9 hip, -0.7 spine   ESOPHAGOGASTRODUODENOSCOPY  12/2017   WNL, regardless esophagus dilated, small HH Russella Dar)   EYE SURGERY     LUMBAR LAMINECTOMY/DECOMPRESSION MICRODISCECTOMY Right 11/27/2019   Right Lumbar Four-Five foraminotomy;  Maeola Harman, MD)   LUMBAR LAMINECTOMY/DECOMPRESSION MICRODISCECTOMY Right 05/16/2022   Procedure: OPEN LUMBAR LAMINECTOMY, RT, L45 W/LATERAL RECESS DECOMPRESSION;  Surgeon: Bethann Goo, DO;  Location: [facility] OR;  Service: Neurosurgery;  Laterality: Right;  3C   MINOR PLACEMENT OF FIDUCIAL N/A 06/30/2021   Procedure: MINOR PLACEMENT OF FIDUCIAL;  Surgeon:  Maeola Harman, MD;  Location[facility] OR;  Service: Neurosurgery;  Laterality: N/A;  Minor room   PTOSIS REPAIR Bilateral 10/2020   Plastic Surgery   PULSE GENERATOR IMPLANT Right 07/14/2021   Procedure: UNILATERAL PULSE GENERATOR IMPLANT;  Surgeon: Maeola Harman, MD;  Location: [facility] OR;  Service: Neurosurgery;  Laterality: Right;   SUBTHALAMIC STIMULATOR INSERTION Bilateral 07/07/2021   Procedure: Deep brain stimulator placement;  Surgeon: Maeola Harman, MD;  Location: [facility] OR;  Service: Neurosurgery;  Laterality: Bilateral;   TUBAL LIGATION     UPPER GASTROINTESTINAL ENDOSCOPY     Patient Active Problem List   Diagnosis Date Noted   Palpitations 07/27/2023   Moderate aortic regurgitation 07/27/2023   Paraparesis (HCC) 07/25/2023   Right leg weakness 07/25/2023   Allergy    Anxiety    Arthritis    Blood transfusion without reported diagnosis    Cataract    CHF (congestive heart failure) (HCC)    Complication of anesthesia    Depression    Headache    Interstitial cystitis    Pneumonia    PONV (postoperative nausea and vomiting)    Neck pain 07/11/2023   Valvular heart disease 07/11/2023   Hypercalcemia 06/09/2023    Hypertension 05/20/2023   Visual changes 05/20/2023   Urinary incontinence 01/12/2023   Lumbar radiculopathy, chronic 05/16/2022   Pre-op evaluation 04/26/2022   Localized swelling of chest wall 12/23/2021   Other fatigue 11/25/2021   Closed fracture of rib of right side with routine healing 05/28/2021   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   Degenerative lumbar spinal stenosis 11/27/2019   Knee pain, bilateral 08/06/2018   Constipation 08/06/2018   Right lumbar radiculopathy 04/10/2018   Dysphagia 10/09/2017   Chronic cough 07/16/2017   GAD (generalized anxiety disorder) 04/28/2016   Chronic insomnia 03/07/2016   Parkinson's disease (HCC) 08/25/2015   Advanced care planning/counseling discussion 05/26/2015   Family circumstance 02/23/2015   Encounter for general adult medical examination with abnormal findings 05/21/2014   Chronic, continuous use of opioids 12/07/2013   MDD (major depressive disorder), single episode, moderate (HCC) 11/12/2013   Dysuria 09/02/2013   DDD (degenerative disc disease), lumbar 05/31/2013   Medicare annual wellness visit, subsequent 05/20/2013   HLD (hyperlipidemia) 05/20/2013   Vitamin D deficiency 05/09/2013   Glaucoma 02/2013   Recurrent UTI 01/03/2011   Syncope 01/03/2011   Rash and other nonspecific skin eruption 05/11/2010   GERD 02/22/2010   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/08/2010   Osteoporosis 11/10/2009   Orthostatic hypotension 12/06/2008   Anemia 09/25/2007   Vitamin B12 deficiency 05/03/2007   Fibromyalgia 05/03/2007   Takotsubo cardiomyopathy 2008   History of colon polyps 2004    ONSET DATE: 06/28/2022  REFERRING DIAG: Parkinson's, S/P lumbar surgery  THERAPY DIAG:  Unsteadiness on feet  Parkinson's disease with dyskinesia, unspecified whether manifestations fluctuate  (HCC)  Muscle weakness (generalized)  Difficulty in walking, not elsewhere classified  Rationale for Evaluation and Treatment Rehabilitation  SUBJECTIVE:  SUBJECTIVE STATEMENT: Patient was being seen here last year and into this year, has diagnosis of PD.  She had a few times that she passed out, she has had one fall and reports that she is more unsteady, she and her son reports that her legs have been "freezing", reports that it is happening multiple times a day, she reports that she stands and is stuck for a few minutes before she can get to take a step  Pt accompanied by: son  PERTINENT HISTORY: see above, did have lumbar surgery in June, has a deep brain stimulator for movement disorder  PAIN:  Are you having pain? Yes: NPRS scale: 2/10 Pain location: lright rib area Pain description: ache/sore Aggravating factors: bending, lifting,doing too much pain can be 6/10 Relieving factors: rest can be 0/10  PrecAUTIONS: None  WEIGHT BEARING RESTRICTIONS No  FALLS: Has patient fallen in last 6 months? Yes. Number of falls 1  LIVING ENVIRONMENT: Lives with: lives with their family Lives in: House/apartment Stairs: Yes: Internal: 12 steps; can reach both Has following equipment at home: Single point cane and Walker - 4 wheeled  PLOF: Independent, does some housework  PATIENT GOALS:  be stronger, walk longer and better, have less episodes of "freezing"  OBJECTIVE:   COGNITION: Overall cognitive status: Within functional limits for tasks assessed   SENSATION: WFL   POSTURE: rounded shoulders, forward head, and decreased lumbar lordosis  LOWER EXTREMITY ROM:    Motions are WFL's but she has poor control of the right LE, weakness and dyskinesia  LOWER EXTREMITY MMT:    MMT Right Eval  Left Eval  Hip flexion 4- 4-  Hip extension 4- 4-  Hip abduction 4- 4-  Hip adduction 4- 4-  Hip internal rotation    Hip external rotation    Knee flexion 4- 4  Knee extension 4- 4  Ankle dorsiflexion 4- 4-  Ankle plantarflexion    Ankle inversion    Ankle eversion 4- 4-  (Blank rows = not tested)  TRANSFERS:  Has to use hands, if not falls back into chair, had to have multiple attempts at times due to falling back in chair  GAIT: Gait pattern: has extraneous motions of the right arm and the right leg and the shoulders and head at times when we walked with HHA, reports feels unsteady, she is shuffling her feet bilaterally today Distance walked: 200 feet Assistive device utilized: Walker - 4 wheeled and None Level of assistance: Complete Independence and CGA Comments: on stairs does one at a time  FUNCTIONAL TESTs:  5 times sit to stand: 56 seconds, has to use hands tends to fall backward multiple times during the attempts. Timed up and go (TUG): 60 seconds, with 4WW, uses hands to push up from sitting, has trouble initiating the first step BERG balance 12/56 3 minute walk test 200 feet with 4WW  TODAY'S TREATMENT:  09/20/23 Nustep level 4 x 5 minutes  PATIENT EDUCATION: Education details: POC Person educated: Patient and Child(ren) Education method: Explanation Education comprehension: verbalized understanding   HOME EXERCISE PROGRAM: Spoke with her and her son about some walking in the house with walker, seated kicks, marches and toe taps    GOALS: Goals reviewed with patient? Yes  SHORT TERM GOALS: Target date: 10/24/23  Independent with initial HEP  Goal status: INITIAL   LONG TERM GOALS: Target date: 11/27/23  Independent with advanced HEP Goal status: INITIAL  2.  Decrease TUG time to 20 seconds Goal status:  INITIAL  3.  Increase BERG balance test score to 42/56 Goal status: INITIAL  4. Walk 360 feet in the 3 minute walk test Goal status:  INITIAL  5.  Get up from sitting without using hands Goal status: INITIAL  ASSESSMENT:  CLINICAL IMPRESSION: Patient is a 77 y.o. female who was seen today for physical therapy evaluation and treatment for Parkinson's and weakness with a recent fall, hospitalization May 30, 2023 due to syncope, she reports that since being in the hospital she has really gone down hill.  She had a fall in the bathroom in February and broke her right ribs.  She reports that she had some syncope issues in July and was hospitalized reports that since that time she has really gone downhill.  She reports that she is weak and having difficulty walking having times where she freezes and really struggles to get up from sitting. She is shuffling feet today, back in May she was using a SPC and or nothing, today using a 4WW.  In May she did the TUG in 16 seconds without device, today it was 60 seconds, in May she did 330 feet without device during 3 minute walk test, today 200 feet.  Significant decrease in her functional status compared to earlier in this year.  OBJECTIVE IMPAIRMENTS Abnormal gait, decreased activity tolerance, decreased balance, decreased coordination, decreased endurance, decreased mobility, difficulty walking, decreased ROM, decreased strength, increased muscle spasms, impaired flexibility, improper body mechanics, postural dysfunction, and pain.   REHAB POTENTIAL: Good  CLINICAL DECISION MAKING: Stable/uncomplicated  EVALUATION COMPLEXITY: Low  PLAN: PT FREQUENCY: 1x/week  PT DURATION: 9 weeks  PLANNED INTERVENTIONS: Therapeutic exercises, Therapeutic activity, Neuromuscular re-education, Balance training, Gait training, Patient/Family education, Self Care, Joint mobilization, Stair training, Electrical stimulation, Spinal mobilization, Cryotherapy, Moist heat, and Manual therapy  PLAN FOR NEXT SESSION: strength, balance, transfers   Aaronmichael Brumbaugh W, PT 09/20/2023, 1:10 PM

## 2023-09-26 NOTE — Addendum Note (Signed)
Addended by: Jearld Lesch on: 09/26/2023 07:41 AM   Modules accepted: Orders

## 2023-09-28 ENCOUNTER — Other Ambulatory Visit: Payer: Self-pay | Admitting: Family Medicine

## 2023-09-28 DIAGNOSIS — M51369 Other intervertebral disc degeneration, lumbar region without mention of lumbar back pain or lower extremity pain: Secondary | ICD-10-CM

## 2023-09-30 ENCOUNTER — Encounter: Payer: Self-pay | Admitting: Family Medicine

## 2023-10-01 NOTE — Telephone Encounter (Signed)
Refill request for TRAMADOL HCL 50 MG TABLET   LOV - 07/11/23 Next OV - 01/15/24 Last refill - 04/10/23 #30/3

## 2023-10-01 NOTE — Telephone Encounter (Signed)
Last refill: 04/10/23 #30 / 3 refills  Last OV 07/11/23 Next OV 01/15/24

## 2023-10-02 NOTE — Telephone Encounter (Signed)
ERx 

## 2023-10-03 ENCOUNTER — Encounter: Payer: Self-pay | Admitting: Physical Therapy

## 2023-10-03 ENCOUNTER — Ambulatory Visit: Payer: Medicare Other | Attending: Neurology | Admitting: Physical Therapy

## 2023-10-03 DIAGNOSIS — G20B1 Parkinson's disease with dyskinesia, without mention of fluctuations: Secondary | ICD-10-CM | POA: Diagnosis not present

## 2023-10-03 DIAGNOSIS — R2681 Unsteadiness on feet: Secondary | ICD-10-CM | POA: Insufficient documentation

## 2023-10-03 DIAGNOSIS — M5459 Other low back pain: Secondary | ICD-10-CM | POA: Diagnosis not present

## 2023-10-03 DIAGNOSIS — M6281 Muscle weakness (generalized): Secondary | ICD-10-CM | POA: Diagnosis not present

## 2023-10-03 DIAGNOSIS — R262 Difficulty in walking, not elsewhere classified: Secondary | ICD-10-CM | POA: Insufficient documentation

## 2023-10-03 NOTE — Therapy (Signed)
OUTPATIENT PHYSICAL THERAPY NEURO TREATMENT   Patient Name: [name] MRN: [id number] DOB:[birth year], 77 y.o., female Today's Date: 10/03/2023   PCP: Lydia Guiles PROVIDER: [name]   PT End of Session - 10/03/23 1528     Visit Number 2    Date for PT Re-Evaluation 11/27/23    Authorization Type [employer] Medicare    PT Start Time 1527    PT Stop Time 1610    PT Time Calculation (min) 43 min    Activity Tolerance Patient tolerated treatment well    Behavior During Therapy [facility] for tasks assessed/performed             Past Medical History:  Diagnosis Date   Advanced care planning/counseling discussion 05/26/2015   Advanced directive - scanned 07/2021 - wants all children involved in her medical decision, starting with son [name]. Doesn't want prolonged life support if terminal condition however ok with tube feeding.     Allergy    Anemia 09/25/2007   With mild leukopenia, on iron and b12 replacement.  Consider SPEP     Anxiety    Arthritis    Blood transfusion without reported diagnosis    CAD (coronary artery disease) 01/2010   MI, Nishan   Cataract    CHF (congestive heart failure) (HCC)    with episode of sepsis   Chronic cough 07/16/2017   Increased gabapentin to 100 tid from 100 qhs 09/04/2017  Plus max gerd rx   -  MBS 09/11/2017  Ok, rec barium swallow  - DgEs 09/21/2017 >>> Occasional secondary and tertiary contractions in the distal half of the esophagus, but primary peristaltic waves in the esophagus were essentially intact. No appreciable esophageal stricture or ulceration. In borderline slow but successful progress of the    Chronic insomnia 03/07/2016   Chronic, continuous use of opioids 12/07/2013   Closed fracture of rib of right side with routine healing 05/28/2021   Complication of anesthesia    Constipation 08/06/2018   DDD (degenerative disc disease), lumbar 05/31/2013   Lspine 2011 - mild DDD  S/p laminectomy and foraminotomy early 2021 Venetia Maxon)   S/p lumbar laminectomy 04/2022 (Dr Dawley)      Degenerative lumbar spinal stenosis 11/27/2019   Depression    not recently   Dysphagia 10/09/2017   Dysuria 09/02/2013   Encounter for general adult medical examination with abnormal findings 05/21/2014   Family circumstance 02/23/2015   Fibromyalgia    GAD (generalized anxiety disorder) 04/28/2016   GERD 02/22/2010   Glaucoma 02/2013   [facility] [place] [facility]   Headache    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   History of colon polyps 2004   HLD (hyperlipidemia) 05/20/2013   Hypercalcemia 06/09/2023   Hypertension 05/20/2023   HYPOTENSION, ORTHOSTATIC 12/06/2008   Interstitial cystitis    [place] now Dr Logan Bores   Knee pain, bilateral 08/06/2018   Localized swelling of chest wall 12/23/2021   Lumbar radiculopathy, chronic 05/16/2022   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/08/2010   MDD (major depressive disorder), single episode, moderate (HCC) 11/12/2013   Medicare annual wellness visit, subsequent 05/20/2013   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  Prior yearly reclast, prolia started 03/2017  DEXA 10/2019 - T -2.8 hip, -0.1 spine  DEXA 09/2022 -  T -3.1 L forearm, -2.6 L femur neck     Last prolia injection: 09/21/2022 - when priced out 03/2023, too expensive for patient. 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   Dr Dimas Millin plastic surgery     Paraparesis (HCC) 07/25/2023   Parkinson's disease (HCC) 08/25/2015   Dx Dr [name] 07/2015    Pneumonia    PONV (postoperative nausea and vomiting)    Pre-op evaluation 04/26/2022   Rash and other nonspecific skin eruption 05/11/2010   Annotation: thought to be autoimmune---responsive  to cellcept  Has had eval at two tertiary centers  Rash is responsive to cellcept     Recurrent UTI 01/03/2011   UTI history:  12/2022 - pansensitive E coli treated with kefiex 7d course  01/2023 - pansensitive E coli treated with keflex 3d course   04/2023 - abnormal UA/micro at [facility] treated with keflex 5d course, UCx not sent     Right leg weakness 07/25/2023   Right lumbar radiculopathy 04/10/2018   MRI Lumbar spine 10/2018:   Multilevel DDD and facet degeneration with some resultant spinal and foraminal stenosis most prominent L5/S1 with R protrusion impinging on L5 nerve root, shallow central protrusion.   Saw [facility] Shon Baton) referred to Dr Ethelene Hal PMR 11/2018  LESI x3 with mild benefit 12/2018  S/p laminectomy/foraminotomy 2021 Venetia Maxon)  S/p lumbar laminectomy 04/2022 (Dr Dawley)      Syncope 01/03/2011   Zio patch 05/2023:  HR 58 - 171 bpm, average 78 bpm.  7 nonsustained SVT, longest 6 beats.  Rare supraventricular and ventricular ectopy.  No sustained arrhythmias.  No atrial fibrillation.     Takotsubo cardiomyopathy 2008   due to E coli urosepsis   Urinary incontinence 01/12/2023   Valvular heart disease 07/11/2023   Echocardiogram 05/21/2023 at [facility]  LVEF 55-60%, normal wall motion, impaired relaxation pattern consistent with age related change, mild MR, tr TR, mild aortic sclerosis without stenosis, mild-mod AR, fair quality      Visual changes 05/20/2023   Vitamin B12 deficiency 05/03/2007   IF negative (02/2017)     Vitamin D deficiency 05/09/2013   Past Surgical History:  Procedure Laterality Date   ABDOMINAL HYSTERECTOMY  1970s   IUD infection - first partial then with oophorectomy (cysts), complication - low blood pressure   CATARACT EXTRACTION Bilateral    CHOLECYSTECTOMY     complication - low blood pressure   COLONOSCOPY  06/2008   h/o polyps but latest WNL, rec rpt 10 yrs Juanda Chance)   COLONOSCOPY  11/2018   multiple TAs (10 polyps total), rpt 2 yrs Russella Dar)    COLONOSCOPY  04/2021   multiple TAs, rpt 3 yrs Russella Dar)   CYSTOSCOPY  12/2013   abx treatment for recurrent cystitis   DEXA  04/2013   T -2.9 @ femur, -1.6 @ spine   DEXA  04/2017   T -2.9 hip, -0.7 spine   ESOPHAGOGASTRODUODENOSCOPY  12/2017   WNL, regardless esophagus dilated, small HH Russella Dar)   EYE SURGERY     LUMBAR LAMINECTOMY/DECOMPRESSION MICRODISCECTOMY Right 11/27/2019   Right Lumbar Four-Five foraminotomy;  Maeola Harman, MD)   LUMBAR LAMINECTOMY/DECOMPRESSION MICRODISCECTOMY Right 05/16/2022   Procedure: OPEN LUMBAR LAMINECTOMY, RT, L45 W/LATERAL RECESS DECOMPRESSION;  Surgeon: Bethann Goo, DO;  Location: [facility] OR;  Service: Neurosurgery;  Laterality: Right;  3C   MINOR PLACEMENT OF FIDUCIAL N/A 06/30/2021   Procedure: MINOR PLACEMENT OF FIDUCIAL;  Surgeon:  Maeola Harman, MD;  Location: [facility] OR;  Service: Neurosurgery;  Laterality: N/A;  Minor room   PTOSIS REPAIR Bilateral 10/2020   Plastic Surgery   PULSE GENERATOR IMPLANT Right 07/14/2021   Procedure: UNILATERAL PULSE GENERATOR IMPLANT;  Surgeon: Maeola Harman, MD;  Location: [facility] OR;  Service: Neurosurgery;  Laterality: Right;   SUBTHALAMIC STIMULATOR INSERTION Bilateral 07/07/2021   Procedure: Deep brain stimulator placement;  Surgeon: Maeola Harman, MD;  Location: [facility] OR;  Service: Neurosurgery;  Laterality: Bilateral;   TUBAL LIGATION     UPPER GASTROINTESTINAL ENDOSCOPY     Patient Active Problem List   Diagnosis Date Noted   Palpitations 07/27/2023   Moderate aortic regurgitation 07/27/2023   Paraparesis (HCC) 07/25/2023   Right leg weakness 07/25/2023   Allergy    Anxiety    Arthritis    Blood transfusion without reported diagnosis    Cataract    CHF (congestive heart failure) (HCC)    Complication of anesthesia    Depression    Headache    Interstitial cystitis    Pneumonia    PONV (postoperative nausea and vomiting)    Neck pain 07/11/2023   Valvular heart disease 07/11/2023   Hypercalcemia 06/09/2023    Hypertension 05/20/2023   Visual changes 05/20/2023   Urinary incontinence 01/12/2023   Lumbar radiculopathy, chronic 05/16/2022   Pre-op evaluation 04/26/2022   Localized swelling of chest wall 12/23/2021   Other fatigue 11/25/2021   Closed fracture of rib of right side with routine healing 05/28/2021   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   Degenerative lumbar spinal stenosis 11/27/2019   Knee pain, bilateral 08/06/2018   Constipation 08/06/2018   Right lumbar radiculopathy 04/10/2018   Dysphagia 10/09/2017   Chronic cough 07/16/2017   GAD (generalized anxiety disorder) 04/28/2016   Chronic insomnia 03/07/2016   Parkinson's disease (HCC) 08/25/2015   Advanced care planning/counseling discussion 05/26/2015   Family circumstance 02/23/2015   Encounter for general adult medical examination with abnormal findings 05/21/2014   Chronic, continuous use of opioids 12/07/2013   MDD (major depressive disorder), single episode, moderate (HCC) 11/12/2013   Dysuria 09/02/2013   DDD (degenerative disc disease), lumbar 05/31/2013   Medicare annual wellness visit, subsequent 05/20/2013   HLD (hyperlipidemia) 05/20/2013   Vitamin D deficiency 05/09/2013   Glaucoma 02/2013   Recurrent UTI 01/03/2011   Syncope 01/03/2011   Rash and other nonspecific skin eruption 05/11/2010   GERD 02/22/2010   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/08/2010   Osteoporosis 11/10/2009   Orthostatic hypotension 12/06/2008   Anemia 09/25/2007   Vitamin B12 deficiency 05/03/2007   Fibromyalgia 05/03/2007   Takotsubo cardiomyopathy 2008   History of colon polyps 2004    ONSET DATE: 06/28/2022  REFERRING DIAG: Parkinson's, S/P lumbar surgery  THERAPY DIAG:  Unsteadiness on feet  Parkinson's disease with dyskinesia, unspecified whether manifestations fluctuate  (HCC)  Muscle weakness (generalized)  Difficulty in walking, not elsewhere classified  Other low back pain  Rationale for Evaluation and Treatment Rehabilitation  SUBJECTIVE:  SUBJECTIVE STATEMENT: Patient was being seen here last year and into this year, has diagnosis of PD. She reports no recent falls, reports that she has a HA Pt accompanied by: son  PERTINENT HISTORY: see above, did have lumbar surgery in June, has a deep brain stimulator for movement disorder  PAIN:  Are you having pain? Yes: NPRS scale: 2/10 Pain location: lright rib area Pain description: ache/sore Aggravating factors: bending, lifting,doing too much pain can be 6/10 Relieving factors: rest can be 0/10  PrecAUTIONS: None  WEIGHT BEARING RESTRICTIONS No  FALLS: Has patient fallen in last 6 months? Yes. Number of falls 1  LIVING ENVIRONMENT: Lives with: lives with their family Lives in: House/apartment Stairs: Yes: Internal: 12 steps; can reach both Has following equipment at home: Single point cane and Walker - 4 wheeled  PLOF: Independent, does some housework  PATIENT GOALS:  be stronger, walk longer and better, have less episodes of "freezing"  OBJECTIVE:   COGNITION: Overall cognitive status: Within functional limits for tasks assessed   SENSATION: WFL   POSTURE: rounded shoulders, forward head, and decreased lumbar lordosis  LOWER EXTREMITY ROM:    Motions are WFL's but she has poor control of the right LE, weakness and dyskinesia  LOWER EXTREMITY MMT:    MMT Right Eval Left Eval  Hip flexion 4- 4-  Hip extension 4- 4-  Hip abduction 4- 4-  Hip adduction 4- 4-  Hip internal rotation    Hip external rotation    Knee flexion 4- 4  Knee extension 4- 4  Ankle dorsiflexion 4- 4-  Ankle  plantarflexion    Ankle inversion    Ankle eversion 4- 4-  (Blank rows = not tested)  TRANSFERS:  Has to use hands, if not falls back into chair, had to have multiple attempts at times due to falling back in chair  GAIT: Gait pattern: has extraneous motions of the right arm and the right leg and the shoulders and head at times when we walked with HHA, reports feels unsteady, she is shuffling her feet bilaterally today Distance walked: 200 feet Assistive device utilized: Walker - 4 wheeled and None Level of assistance: Complete Independence and CGA Comments: on stairs does one at a time  FUNCTIONAL TESTs:  5 times sit to stand: 56 seconds, has to use hands tends to fall backward multiple times during the attempts. Timed up and go (TUG): 60 seconds, with 4WW, uses hands to push up from sitting, has trouble initiating the first step BERG balance 12/56 3 minute walk test 200 feet with 4WW  TODAY'S TREATMENT:  10/03/23 Nustep level 4 x 5 minutes Gait outside around the back building one rest break and then up to the front door, with rollator and CGA Cone toe touches Side stepping airex balance beam in Pbars Tandem gait on airex balance bean in pbars Step turn reach PWR moves in sitting Walking HHA faster pace 200 feet Direction changes with HHA  09/20/23 Nustep level 4 x 5 minutes  PATIENT EDUCATION: Education details: POC Person educated: Patient and Child(ren) Education method: Explanation Education comprehension: verbalized understanding   HOME EXERCISE PROGRAM: Spoke with her and her son about some walking in the house with walker, seated kicks, marches and toe taps 10/03/23 added PWR moves to HEP  GOALS: Goals reviewed with patient? Yes  SHORT TERM GOALS: Target date: 10/24/23  Independent with initial HEP  Goal status: gave handout today 10/03/23   LONG TERM GOALS: Target date: 11/27/23  Independent with advanced HEP Goal status: INITIAL  2.  Decrease TUG  time to 20 seconds Goal status: INITIAL  3.  Increase BERG balance test score to 42/56 Goal status: INITIAL  4. Walk 360 feet in the 3 minute walk test Goal status: INITIAL  5.  Get up from sitting without using hands Goal status: INITIAL  ASSESSMENT:  CLINICAL IMPRESSION: Patient is a 77 y.o. female who was seen today for physical therapy evaluation and treatment for Parkinson's and weakness with a recent fall, hospitalization May 30, 2023 due to syncope, she reports that since being in the hospital she has really gone down hill.  She had a fall in the bathroom in February and broke her right ribs.  Added walking. Balance and PWR moves today, gave PWR moves for HEP.  Has right spasticity of the ankle with walking, right shoulder propels fwd and her neck turns to the right.  OBJECTIVE IMPAIRMENTS Abnormal gait, decreased activity tolerance, decreased balance, decreased coordination, decreased endurance, decreased mobility, difficulty walking, decreased ROM, decreased strength, increased muscle spasms, impaired flexibility, improper body mechanics, postural dysfunction, and pain.   REHAB POTENTIAL: Good  CLINICAL DECISION MAKING: Stable/uncomplicated  EVALUATION COMPLEXITY: Low  PLAN: PT FREQUENCY: 1x/week  PT DURATION: 9 weeks  PLANNED INTERVENTIONS: Therapeutic exercises, Therapeutic activity, Neuromuscular re-education, Balance training, Gait training, Patient/Family education, Self Care, Joint mobilization, Stair training, Electrical stimulation, Spinal mobilization, Cryotherapy, Moist heat, and Manual therapy  PLAN FOR NEXT SESSION: strength, balance, transfers   Jearld Lesch, PT 10/03/2023, 3:28 PM

## 2023-10-04 NOTE — Telephone Encounter (Addendum)
This was refilled 10/02/2023.

## 2023-10-08 ENCOUNTER — Encounter: Payer: Self-pay | Admitting: Family Medicine

## 2023-10-09 ENCOUNTER — Encounter: Payer: Self-pay | Admitting: Physical Therapy

## 2023-10-09 ENCOUNTER — Ambulatory Visit: Payer: Medicare Other | Admitting: Physical Therapy

## 2023-10-09 DIAGNOSIS — R2681 Unsteadiness on feet: Secondary | ICD-10-CM

## 2023-10-09 DIAGNOSIS — M6281 Muscle weakness (generalized): Secondary | ICD-10-CM

## 2023-10-09 DIAGNOSIS — M5459 Other low back pain: Secondary | ICD-10-CM | POA: Diagnosis not present

## 2023-10-09 DIAGNOSIS — R262 Difficulty in walking, not elsewhere classified: Secondary | ICD-10-CM | POA: Diagnosis not present

## 2023-10-09 DIAGNOSIS — G20B1 Parkinson's disease with dyskinesia, without mention of fluctuations: Secondary | ICD-10-CM

## 2023-10-09 NOTE — Therapy (Signed)
OUTPATIENT PHYSICAL THERAPY NEURO TREATMENT   Patient Name: [name] MRN: [id number] DOB:1946/03/10, 77 y.o., female Today's Date: 10/09/2023   PCP: Lydia Guiles PROVIDER: [name]   PT End of Session - 10/09/23 1524     Visit Number 3    Date for PT Re-Evaluation 11/27/23    Authorization Type [employer] Medicare    PT Start Time 1526    PT Stop Time 1612    PT Time Calculation (min) 46 min    Activity Tolerance Patient tolerated treatment well    Behavior During Therapy [facility] for tasks assessed/performed             Past Medical History:  Diagnosis Date   Advanced care planning/counseling discussion 05/26/2015   Advanced directive - scanned 07/2021 - wants all children involved in her medical decision, starting with son [name]. Doesn't want prolonged life support if terminal condition however ok with tube feeding.     Allergy    Anemia 09/25/2007   With mild leukopenia, on iron and b12 replacement.  Consider SPEP     Anxiety    Arthritis    Blood transfusion without reported diagnosis    CAD (coronary artery disease) 01/2010   MI, Nishan   Cataract    CHF (congestive heart failure) (HCC)    with episode of sepsis   Chronic cough 07/16/2017   Increased gabapentin to 100 tid from 100 qhs 09/04/2017  Plus max gerd rx   -  MBS 09/11/2017  Ok, rec barium swallow  - DgEs 09/21/2017 >>> Occasional secondary and tertiary contractions in the distal half of the esophagus, but primary peristaltic waves in the esophagus were essentially intact. No appreciable esophageal stricture or ulceration. In borderline slow but successful progress of the    Chronic insomnia 03/07/2016   Chronic, continuous use of opioids 12/07/2013   Closed fracture of rib of right side with routine healing 05/28/2021   Complication of anesthesia    Constipation 08/06/2018   DDD (degenerative disc disease), lumbar 05/31/2013   Lspine 2011 - mild DDD  S/p laminectomy and foraminotomy early 2021 Venetia Maxon)   S/p lumbar laminectomy 04/2022 (Dr Dawley)      Degenerative lumbar spinal stenosis 11/27/2019   Depression    not recently   Dysphagia 10/09/2017   Dysuria 09/02/2013   Encounter for general adult medical examination with abnormal findings 05/21/2014   Family circumstance 02/23/2015   Fibromyalgia    GAD (generalized anxiety disorder) 04/28/2016   GERD 02/22/2010   Glaucoma 02/2013   [facility]   Headache    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   History of colon polyps 2004   HLD (hyperlipidemia) 05/20/2013   Hypercalcemia 06/09/2023   Hypertension 05/20/2023   HYPOTENSION, ORTHOSTATIC 12/06/2008   Interstitial cystitis    [place] now Dr Logan Bores   Knee pain, bilateral 08/06/2018   Localized swelling of chest wall 12/23/2021   Lumbar radiculopathy, chronic 05/16/2022   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/08/2010   MDD (major depressive disorder), single episode, moderate (HCC) 11/12/2013   Medicare annual wellness visit, subsequent 05/20/2013   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  Prior yearly reclast, prolia started 03/2017  DEXA 10/2019 - T -2.8 hip, -0.1 spine  DEXA 09/2022 -  T -3.1 L forearm, -2.6 L femur neck     Last prolia injection: 09/21/2022 - when priced out 03/2023, too expensive for patient. 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   Dr Dimas Millin plastic surgery     Paraparesis (HCC) 07/25/2023   Parkinson's disease (HCC) 08/25/2015   Dx Dr [name] 07/2015    Pneumonia    PONV (postoperative nausea and vomiting)    Pre-op evaluation 04/26/2022   Rash and other nonspecific skin eruption 05/11/2010   Annotation: thought to be autoimmune---responsive  to cellcept  Has had eval at two tertiary centers  Rash is responsive to cellcept     Recurrent UTI 01/03/2011   UTI history:  12/2022 - pansensitive E coli treated with kefiex 7d course  01/2023 - pansensitive E coli treated with keflex 3d course   04/2023 - abnormal UA/micro at [facility] treated with keflex 5d course, UCx not sent     Right leg weakness 07/25/2023   Right lumbar radiculopathy 04/10/2018   MRI Lumbar spine 10/2018:   Multilevel DDD and facet degeneration with some resultant spinal and foraminal stenosis most prominent L5/S1 with R protrusion impinging on L5 nerve root, shallow central protrusion.   Saw [facility] Shon Baton) referred to Dr Ethelene Hal PMR 11/2018  LESI x3 with mild benefit 12/2018  S/p laminectomy/foraminotomy 2021 Venetia Maxon)  S/p lumbar laminectomy 04/2022 (Dr Dawley)      Syncope 01/03/2011   Zio patch 05/2023:  HR 58 - 171 bpm, average 78 bpm.  7 nonsustained SVT, longest 6 beats.  Rare supraventricular and ventricular ectopy.  No sustained arrhythmias.  No atrial fibrillation.     Takotsubo cardiomyopathy 2008   due to E coli urosepsis   Urinary incontinence 01/12/2023   Valvular heart disease 07/11/2023   Echocardiogram 05/21/2023 at [facility]  LVEF 55-60%, normal wall motion, impaired relaxation pattern consistent with age related change, mild MR, tr TR, mild aortic sclerosis without stenosis, mild-mod AR, fair quality      Visual changes 05/20/2023   Vitamin B12 deficiency 05/03/2007   IF negative (02/2017)     Vitamin D deficiency 05/09/2013   Past Surgical History:  Procedure Laterality Date   ABDOMINAL HYSTERECTOMY  1970s   IUD infection - first partial then with oophorectomy (cysts), complication - low blood pressure   CATARACT EXTRACTION Bilateral    CHOLECYSTECTOMY     complication - low blood pressure   COLONOSCOPY  06/2008   h/o polyps but latest WNL, rec rpt 10 yrs Juanda Chance)   COLONOSCOPY  11/2018   multiple TAs (10 polyps total), rpt 2 yrs Russella Dar)    COLONOSCOPY  04/2021   multiple TAs, rpt 3 yrs Russella Dar)   CYSTOSCOPY  12/2013   abx treatment for recurrent cystitis   DEXA  04/2013   T -2.9 @ femur, -1.6 @ spine   DEXA  04/2017   T -2.9 hip, -0.7 spine   ESOPHAGOGASTRODUODENOSCOPY  12/2017   WNL, regardless esophagus dilated, small HH Russella Dar)   EYE SURGERY     LUMBAR LAMINECTOMY/DECOMPRESSION MICRODISCECTOMY Right 11/27/2019   Right Lumbar Four-Five foraminotomy;  Maeola Harman, MD)   LUMBAR LAMINECTOMY/DECOMPRESSION MICRODISCECTOMY Right 05/16/2022   Procedure: OPEN LUMBAR LAMINECTOMY, RT, L45 W/LATERAL RECESS DECOMPRESSION;  Surgeon: Bethann Goo, DO;  Location: [facility] OR;  Service: Neurosurgery;  Laterality: Right;  3C   MINOR PLACEMENT OF FIDUCIAL N/A 06/30/2021   Procedure: MINOR PLACEMENT OF FIDUCIAL;  Surgeon:  Maeola Harman, MD;  Location: [facility] OR;  Service: Neurosurgery;  Laterality: N/A;  Minor room   PTOSIS REPAIR Bilateral 10/2020   Plastic Surgery   PULSE GENERATOR IMPLANT Right 07/14/2021   Procedure: UNILATERAL PULSE GENERATOR IMPLANT;  Surgeon: Maeola Harman, MD;  Location: [facility] OR;  Service: Neurosurgery;  Laterality: Right;   SUBTHALAMIC STIMULATOR INSERTION Bilateral 07/07/2021   Procedure: Deep brain stimulator placement;  Surgeon: Maeola Harman, MD;  Location: [facility] OR;  Service: Neurosurgery;  Laterality: Bilateral;   TUBAL LIGATION     UPPER GASTROINTESTINAL ENDOSCOPY     Patient Active Problem List   Diagnosis Date Noted   Palpitations 07/27/2023   Moderate aortic regurgitation 07/27/2023   Paraparesis (HCC) 07/25/2023   Right leg weakness 07/25/2023   Allergy    Anxiety    Arthritis    Blood transfusion without reported diagnosis    Cataract    CHF (congestive heart failure) (HCC)    Complication of anesthesia    Depression    Headache    Interstitial cystitis    Pneumonia    PONV (postoperative nausea and vomiting)    Neck pain 07/11/2023   Valvular heart disease 07/11/2023   Hypercalcemia 06/09/2023    Hypertension 05/20/2023   Visual changes 05/20/2023   Urinary incontinence 01/12/2023   Lumbar radiculopathy, chronic 05/16/2022   Pre-op evaluation 04/26/2022   Localized swelling of chest wall 12/23/2021   Other fatigue 11/25/2021   Closed fracture of rib of right side with routine healing 05/28/2021   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   Degenerative lumbar spinal stenosis 11/27/2019   Knee pain, bilateral 08/06/2018   Constipation 08/06/2018   Right lumbar radiculopathy 04/10/2018   Dysphagia 10/09/2017   Chronic cough 07/16/2017   GAD (generalized anxiety disorder) 04/28/2016   Chronic insomnia 03/07/2016   Parkinson's disease (HCC) 08/25/2015   Advanced care planning/counseling discussion 05/26/2015   Family circumstance 02/23/2015   Encounter for general adult medical examination with abnormal findings 05/21/2014   Chronic, continuous use of opioids 12/07/2013   MDD (major depressive disorder), single episode, moderate (HCC) 11/12/2013   Dysuria 09/02/2013   DDD (degenerative disc disease), lumbar 05/31/2013   Medicare annual wellness visit, subsequent 05/20/2013   HLD (hyperlipidemia) 05/20/2013   Vitamin D deficiency 05/09/2013   Glaucoma 02/2013   Recurrent UTI 01/03/2011   Syncope 01/03/2011   Rash and other nonspecific skin eruption 05/11/2010   GERD 02/22/2010   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/08/2010   Osteoporosis 11/10/2009   Orthostatic hypotension 12/06/2008   Anemia 09/25/2007   Vitamin B12 deficiency 05/03/2007   Fibromyalgia 05/03/2007   Takotsubo cardiomyopathy 2008   History of colon polyps 2004    ONSET DATE: 06/28/2022  REFERRING DIAG: Parkinson's, S/P lumbar surgery  THERAPY DIAG:  Unsteadiness on feet  Parkinson's disease with dyskinesia, unspecified whether manifestations fluctuate  (HCC)  Muscle weakness (generalized)  Difficulty in walking, not elsewhere classified  Other low back pain  Rationale for Evaluation and Treatment Rehabilitation  SUBJECTIVE:  SUBJECTIVE STATEMENT: Patient was being seen here last year and into this year, has diagnosis of PD. She reports walking only with rollator now, but has not done much walking.  She does reports that she has been doing the PWR moves that I gave as an HEP Pt accompanied by: son  PERTINENT HISTORY: see above, did have lumbar surgery in June, has a deep brain stimulator for movement disorder  PAIN:  Are you having pain? Yes: NPRS scale: 2/10 Pain location: lright rib area Pain description: ache/sore Aggravating factors: bending, lifting,doing too much pain can be 6/10 Relieving factors: rest can be 0/10  PrecAUTIONS: None  WEIGHT BEARING RESTRICTIONS No  FALLS: Has patient fallen in last 6 months? Yes. Number of falls 1  LIVING ENVIRONMENT: Lives with: lives with their family Lives in: House/apartment Stairs: Yes: Internal: 12 steps; can reach both Has following equipment at home: Single point cane and Walker - 4 wheeled  PLOF: Independent, does some housework  PATIENT GOALS:  be stronger, walk longer and better, have less episodes of "freezing"  OBJECTIVE:   COGNITION: Overall cognitive status: Within functional limits for tasks assessed   SENSATION: WFL   POSTURE: rounded shoulders, forward head, and decreased lumbar lordosis  LOWER EXTREMITY ROM:    Motions are WFL's but she has poor control of the right LE, weakness and dyskinesia  LOWER EXTREMITY MMT:    MMT Right Eval Left Eval  Hip flexion 4- 4-  Hip extension 4- 4-  Hip abduction 4- 4-  Hip adduction 4- 4-  Hip internal rotation    Hip  external rotation    Knee flexion 4- 4  Knee extension 4- 4  Ankle dorsiflexion 4- 4-  Ankle plantarflexion    Ankle inversion    Ankle eversion 4- 4-  (Blank rows = not tested)  TRANSFERS:  Has to use hands, if not falls back into chair, had to have multiple attempts at times due to falling back in chair  GAIT: Gait pattern: has extraneous motions of the right arm and the right leg and the shoulders and head at times when we walked with HHA, reports feels unsteady, she is shuffling her feet bilaterally today Distance walked: 200 feet Assistive device utilized: Walker - 4 wheeled and None Level of assistance: Complete Independence and CGA Comments: on stairs does one at a time  FUNCTIONAL TESTs:  5 times sit to stand: 56 seconds, has to use hands tends to fall backward multiple times during the attempts. Timed up and go (TUG): 60 seconds, with 4WW, uses hands to push up from sitting, has trouble initiating the first step BERG balance 12/56 3 minute walk test 200 feet with 4WW  TODAY'S TREATMENT:  10/09/23 Nustep level 4 x 5 minutes Gait outside light HHA some cues for step length and speed, had a time when she was shuffling, did around the back building 2.5# LAQ, marches 2x10 Standing hip abduction 2.5# Cone toe touches with HHA and then without In Pbars fwd walking and backward walking big steps Side stepping in pbars Step turn reach in pbars Sit to stand trying without hands, very difficult for her, tends to fall backward, has knees collapse into valgus  10/03/23 Nustep level 4 x 5 minutes Gait outside around the back building one rest break and then up to the front door, with rollator and CGA Cone toe touches Side stepping airex balance beam in Pbars Tandem gait on airex balance bean in pbars Step turn reach PWR moves  in sitting Walking HHA faster pace 200 feet Direction changes with HHA  09/20/23 Nustep level 4 x 5 minutes  PATIENT EDUCATION: Education details:  POC Person educated: Patient and Child(ren) Education method: Explanation Education comprehension: verbalized understanding   HOME EXERCISE PROGRAM: Spoke with her and her son about some walking in the house with walker, seated kicks, marches and toe taps 10/03/23 added PWR moves to HEP  GOALS: Goals reviewed with patient? Yes  SHORT TERM GOALS: Target date: 10/24/23  Independent with initial HEP  Goal status: met 10/09/23   LONG TERM GOALS: Target date: 11/27/23  Independent with advanced HEP Goal status: INITIAL  2.  Decrease TUG time to 20 seconds Goal status: INITIAL  3.  Increase BERG balance test score to 42/56 Goal status: INITIAL  4. Walk 360 feet in the 3 minute walk test Goal status: INITIAL  5.  Get up from sitting without using hands Goal status: INITIAL  ASSESSMENT:  CLINICAL IMPRESSION: Patient is a 77 y.o. female who was seen today for physical therapy evaluation and treatment for Parkinson's and weakness with a recent fall, hospitalization May 30, 2023 due to syncope, she reports that since being in the hospital she has really gone down hill.  She had a fall in the bathroom in February and broke her right ribs. Walked around the back building today with light HHA and gait belt.  She had a few instances of shuffling where she needed a cue to take big steps and for faster speeds  OBJECTIVE IMPAIRMENTS Abnormal gait, decreased activity tolerance, decreased balance, decreased coordination, decreased endurance, decreased mobility, difficulty walking, decreased ROM, decreased strength, increased muscle spasms, impaired flexibility, improper body mechanics, postural dysfunction, and pain.   REHAB POTENTIAL: Good  CLINICAL DECISION MAKING: Stable/uncomplicated  EVALUATION COMPLEXITY: Low  PLAN: PT FREQUENCY: 1x/week  PT DURATION: 9 weeks  PLANNED INTERVENTIONS: Therapeutic exercises, Therapeutic activity, Neuromuscular re-education, Balance training, Gait  training, Patient/Family education, Self Care, Joint mobilization, Stair training, Electrical stimulation, Spinal mobilization, Cryotherapy, Moist heat, and Manual therapy  PLAN FOR NEXT SESSION: strength, balance, transfers   Jearld Lesch, PT 10/09/2023, 3:24 PM

## 2023-10-11 MED ORDER — FLUDROCORTISONE ACETATE 0.1 MG PO TABS: 100.0000 ug | ORAL_TABLET | Freq: Every day | ORAL | 6 refills | Status: DC

## 2023-10-11 NOTE — Telephone Encounter (Signed)
ERx 

## 2023-10-16 ENCOUNTER — Ambulatory Visit: Payer: Medicare Other | Admitting: Physical Therapy

## 2023-10-23 ENCOUNTER — Ambulatory Visit: Payer: Medicare Other | Admitting: Physical Therapy

## 2023-11-07 ENCOUNTER — Encounter: Payer: Medicare Other | Admitting: Physical Therapy

## 2023-11-14 ENCOUNTER — Ambulatory Visit: Payer: Medicare Other | Admitting: Physical Therapy

## 2023-11-15 ENCOUNTER — Ambulatory Visit: Payer: Medicare Other | Attending: Neurology | Admitting: Physical Therapy

## 2023-11-15 ENCOUNTER — Encounter: Payer: Self-pay | Admitting: Physical Therapy

## 2023-11-15 DIAGNOSIS — R262 Difficulty in walking, not elsewhere classified: Secondary | ICD-10-CM | POA: Diagnosis not present

## 2023-11-15 DIAGNOSIS — M6281 Muscle weakness (generalized): Secondary | ICD-10-CM

## 2023-11-15 DIAGNOSIS — R2681 Unsteadiness on feet: Secondary | ICD-10-CM

## 2023-11-15 DIAGNOSIS — G20B1 Parkinson's disease with dyskinesia, without mention of fluctuations: Secondary | ICD-10-CM

## 2023-11-15 DIAGNOSIS — M5459 Other low back pain: Secondary | ICD-10-CM

## 2023-11-15 NOTE — Therapy (Addendum)
OUTPATIENT PHYSICAL THERAPY NEURO TREATMENT   Patient Name: [name] MRN: [id number] DOB:01/18/1946, 77 y.o., female Today's Date: 11/15/2023   PCP: Lydia Guiles PROVIDER: [name]   PT End of Session - 11/15/23 1449     Visit Number 4    Date for PT Re-Evaluation 11/29/23    Authorization Type [employer] Medicare 4/8    PT Start Time 1443    PT Stop Time 1530    PT Time Calculation (min) 47 min    Activity Tolerance Patient tolerated treatment well    Behavior During Therapy [facility] for tasks assessed/performed             Past Medical History:  Diagnosis Date   Advanced care planning/counseling discussion 05/26/2015   Advanced directive - scanned 07/2021 - wants all children involved in her medical decision, starting with son [name]. Doesn't want prolonged life support if terminal condition however ok with tube feeding.     Allergy    Anemia 09/25/2007   With mild leukopenia, on iron and b12 replacement.  Consider SPEP     Anxiety    Arthritis    Blood transfusion without reported diagnosis    CAD (coronary artery disease) 01/2010   MI, Nishan   Cataract    CHF (congestive heart failure) (HCC)    with episode of sepsis   Chronic cough 07/16/2017   Increased gabapentin to 100 tid from 100 qhs 09/04/2017  Plus max gerd rx   -  MBS 09/11/2017  Ok, rec barium swallow  - DgEs 09/21/2017 >>> Occasional secondary and tertiary contractions in the distal half of the esophagus, but primary peristaltic waves in the esophagus were essentially intact. No appreciable esophageal stricture or ulceration. In borderline slow but successful progress of the    Chronic insomnia 03/07/2016   Chronic, continuous use of opioids 12/07/2013   Closed fracture of rib of right side with routine healing 05/28/2021   Complication of anesthesia    Constipation 08/06/2018   DDD (degenerative disc disease), lumbar 05/31/2013   Lspine 2011 - mild DDD  S/p laminectomy and foraminotomy early 2021  Venetia Maxon)  S/p lumbar laminectomy 04/2022 (Dr Dawley)      Degenerative lumbar spinal stenosis 11/27/2019   Depression    not recently   Dysphagia 10/09/2017   Dysuria 09/02/2013   Encounter for general adult medical examination with abnormal findings 05/21/2014   Family circumstance 02/23/2015   Fibromyalgia    GAD (generalized anxiety disorder) 04/28/2016   GERD 02/22/2010   Glaucoma 02/2013   [facility]   Headache    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   History of colon polyps 2004   HLD (hyperlipidemia) 05/20/2013   Hypercalcemia 06/09/2023   Hypertension 05/20/2023   HYPOTENSION, ORTHOSTATIC 12/06/2008   Interstitial cystitis    [place] now Dr Logan Bores   Knee pain, bilateral 08/06/2018   Localized swelling of chest wall 12/23/2021   Lumbar radiculopathy, chronic 05/16/2022   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/08/2010   MDD (major depressive disorder), single episode, moderate (HCC) 11/12/2013   Medicare annual wellness visit, subsequent 05/20/2013   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  Prior yearly reclast, prolia started 03/2017  DEXA 10/2019 - T -2.8 hip, -0.1 spine  DEXA 09/2022 -  T -3.1 L forearm, -2.6 L femur neck     Last prolia injection: 09/21/2022 - when priced out 03/2023, too expensive for patient. 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---responsive to cellcept  Has had eval at two tertiary centers  Rash is responsive to cellcept     Recurrent UTI 01/03/2011   UTI history:  12/2022 - pansensitive E coli treated with kefiex 7d course  01/2023 - pansensitive E coli treated with keflex 3d course   04/2023 - abnormal UA/micro at [facility] treated with keflex 5d course, UCx not sent     Right leg weakness 07/25/2023   Right lumbar radiculopathy 04/10/2018   MRI Lumbar spine 10/2018:   Multilevel DDD and facet degeneration with some resultant spinal and foraminal stenosis most prominent L5/S1 with R protrusion impinging on L5 nerve root, shallow central protrusion.   Saw [facility] Shon Baton) referred to Dr Ethelene Hal PMR 11/2018  LESI x3 with mild benefit 12/2018  S/p laminectomy/foraminotomy 2021 Venetia Maxon)  S/p lumbar laminectomy 04/2022 (Dr Dawley)      Syncope 01/03/2011   Zio patch 05/2023:  HR 58 - 171 bpm, average 78 bpm.  7 nonsustained SVT, longest 6 beats.  Rare supraventricular and ventricular ectopy.  No sustained arrhythmias.  No atrial fibrillation.     Takotsubo cardiomyopathy 2008   due to E coli urosepsis   Urinary incontinence 01/12/2023   Valvular heart disease 07/11/2023   Echocardiogram 05/21/2023 at [facility]  LVEF 55-60%, normal wall motion, impaired relaxation pattern consistent with age related change, mild MR, tr TR, mild aortic sclerosis without stenosis, mild-mod AR, fair quality      Visual changes 05/20/2023   Vitamin B12 deficiency 05/03/2007   IF negative (02/2017)     Vitamin D deficiency 05/09/2013   Past Surgical History:  Procedure Laterality Date   ABDOMINAL HYSTERECTOMY  1970s   IUD infection - first partial then with oophorectomy (cysts), complication - low blood pressure   CATARACT EXTRACTION Bilateral    CHOLECYSTECTOMY     complication - low blood pressure   COLONOSCOPY  06/2008   h/o polyps but latest WNL, rec rpt 10 yrs Juanda Chance)   COLONOSCOPY  11/2018   multiple TAs (10 polyps  total), rpt 2 yrs Russella Dar)   COLONOSCOPY  04/2021   multiple TAs, rpt 3 yrs Russella Dar)   CYSTOSCOPY  12/2013   abx treatment for recurrent cystitis   DEXA  04/2013   T -2.9 @ femur, -1.6 @ spine   DEXA  04/2017   T -2.9 hip, -0.7 spine   ESOPHAGOGASTRODUODENOSCOPY  12/2017   WNL, regardless esophagus dilated, small HH Russella Dar)   EYE SURGERY     LUMBAR LAMINECTOMY/DECOMPRESSION MICRODISCECTOMY Right 11/27/2019   Right Lumbar Four-Five foraminotomy;  Maeola Harman, MD)   LUMBAR LAMINECTOMY/DECOMPRESSION MICRODISCECTOMY Right 05/16/2022   Procedure: OPEN LUMBAR LAMINECTOMY, RT, L45 W/LATERAL RECESS DECOMPRESSION;  Surgeon: Bethann Goo, DO;  Location: [facility] OR;  Service: Neurosurgery;  Laterality: Right;  3C   MINOR PLACEMENT OF FIDUCIAL N/A 06/30/2021   Procedure: MINOR PLACEMENT OF FIDUCIAL;  Surgeon:  Maeola Harman, MD;  Location: [facility] OR;  Service: Neurosurgery;  Laterality: N/A;  Minor room   PTOSIS REPAIR Bilateral 10/2020   Plastic Surgery   PULSE GENERATOR IMPLANT Right 07/14/2021   Procedure: UNILATERAL PULSE GENERATOR IMPLANT;  Surgeon: Maeola Harman, MD;  Location: [facility] OR;  Service: Neurosurgery;  Laterality: Right;   SUBTHALAMIC STIMULATOR INSERTION Bilateral 07/07/2021   Procedure: Deep brain stimulator placement;  Surgeon: Maeola Harman, MD;  Location: [facility] OR;  Service: Neurosurgery;  Laterality: Bilateral;   TUBAL LIGATION     UPPER GASTROINTESTINAL ENDOSCOPY     Patient Active Problem List   Diagnosis Date Noted   Palpitations 07/27/2023   Moderate aortic regurgitation 07/27/2023   Paraparesis (HCC) 07/25/2023   Right leg weakness 07/25/2023   Allergy    Anxiety    Arthritis    Blood transfusion without reported diagnosis    Cataract    CHF (congestive heart failure) (HCC)    Complication of anesthesia    Depression    Headache    Interstitial cystitis    Pneumonia    PONV (postoperative nausea and vomiting)    Neck pain 07/11/2023   Valvular heart disease 07/11/2023    Hypercalcemia 06/09/2023   Hypertension 05/20/2023   Visual changes 05/20/2023   Urinary incontinence 01/12/2023   Lumbar radiculopathy, chronic 05/16/2022   Pre-op evaluation 04/26/2022   Localized swelling of chest wall 12/23/2021   Other fatigue 11/25/2021   Closed fracture of rib of right side with routine healing 05/28/2021   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   Degenerative lumbar spinal stenosis 11/27/2019   Knee pain, bilateral 08/06/2018   Constipation 08/06/2018   Right lumbar radiculopathy 04/10/2018   Dysphagia 10/09/2017   Chronic cough 07/16/2017   GAD (generalized anxiety disorder) 04/28/2016   Chronic insomnia 03/07/2016   Parkinson's disease (HCC) 08/25/2015   Advanced care planning/counseling discussion 05/26/2015   Family circumstance 02/23/2015   Encounter for general adult medical examination with abnormal findings 05/21/2014   Chronic, continuous use of opioids 12/07/2013   MDD (major depressive disorder), single episode, moderate (HCC) 11/12/2013   Dysuria 09/02/2013   DDD (degenerative disc disease), lumbar 05/31/2013   Medicare annual wellness visit, subsequent 05/20/2013   HLD (hyperlipidemia) 05/20/2013   Vitamin D deficiency 05/09/2013   Glaucoma 02/2013   Recurrent UTI 01/03/2011   Syncope 01/03/2011   Rash and other nonspecific skin eruption 05/11/2010   GERD 02/22/2010   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/08/2010   Osteoporosis 11/10/2009   Orthostatic hypotension 12/06/2008   Anemia 09/25/2007   Vitamin B12 deficiency 05/03/2007   Fibromyalgia 05/03/2007   Takotsubo cardiomyopathy 2008   History of colon polyps 2004    ONSET DATE: 06/28/2022  REFERRING DIAG: Parkinson's, S/P lumbar surgery  THERAPY DIAG:  Unsteadiness on feet  Parkinson's disease with dyskinesia, unspecified whether  manifestations fluctuate (HCC)  Muscle weakness (generalized)  Difficulty in walking, not elsewhere classified  Other low back pain  Rationale for Evaluation and Treatment Rehabilitation  SUBJECTIVE:  SUBJECTIVE STATEMENT: Patient has not been in to see [place] in about 5 weeks, she reports 2 falls, one prior to thanksgiving, and one December 6th.  She reports that the first one she was bending over to pick up something and fell, the second one she got up and went to walk and fell, she reports that she is very sore and tender in the right hip Pt accompanied by: son  PERTINENT HISTORY: see above, did have lumbar surgery in June, has a deep brain stimulator for movement disorder  PAIN:  Are you having pain? Yes: NPRS scale: 2/10 Pain location: lright rib area Pain description: ache/sore Aggravating factors: bending, lifting,doing too much pain can be 6/10 Relieving factors: rest can be 0/10  PrecAUTIONS: None  WEIGHT BEARING RESTRICTIONS No  FALLS: Has patient fallen in last 6 months? Yes. Number of falls 1  LIVING ENVIRONMENT: Lives with: lives with their family Lives in: House/apartment Stairs: Yes: Internal: 12 steps; can reach both Has following equipment at home: Single point cane and Walker - 4 wheeled  PLOF: Independent, does some housework  PATIENT GOALS:  be stronger, walk longer and better, have less episodes of "freezing"  OBJECTIVE:   COGNITION: Overall cognitive status: Within functional limits for tasks assessed   SENSATION: WFL   POSTURE: rounded shoulders, forward head, and decreased lumbar lordosis  LOWER EXTREMITY ROM:    Motions are WFL's but she has poor control of the right LE, weakness and dyskinesia  LOWER EXTREMITY MMT:    MMT Right Eval Left Eval  Hip  flexion 4- 4-  Hip extension 4- 4-  Hip abduction 4- 4-  Hip adduction 4- 4-  Hip internal rotation    Hip external rotation    Knee flexion 4- 4  Knee extension 4- 4  Ankle dorsiflexion 4- 4-  Ankle plantarflexion    Ankle inversion    Ankle eversion 4- 4-  (Blank rows = not tested)  TRANSFERS:  Has to use hands, if not falls back into chair, had to have multiple attempts at times due to falling back in chair  GAIT: Gait pattern: has extraneous motions of the right arm and the right leg and the shoulders and head at times when we walked with HHA, reports feels unsteady, she is shuffling her feet bilaterally today Distance walked: 200 feet Assistive device utilized: Walker - 4 wheeled and None Level of assistance: Complete Independence and CGA Comments: on stairs does one at a time  FUNCTIONAL TESTs:  5 times sit to stand: 56 seconds, has to use hands tends to fall backward multiple times during the attempts. Timed up and go (TUG): 60 seconds, with 4WW, uses hands to push up from sitting, has trouble initiating the first step BERG balance 12/56 3 minute walk test 200 feet with 4WW  TODAY'S TREATMENT:  11/15/23 Gait with rollator with CGA/SBA Seated toe taps and heel raises Seated hip add/abduction LAQ Assessment of the right hip and knee see below  10/09/23 Nustep level 4 x 5 minutes Gait outside light HHA some cues for step length and speed, had a time when she was shuffling, did around the back building 2.5# LAQ, marches 2x10 Standing hip abduction 2.5# Cone toe touches with HHA and then without In Pbars fwd walking and backward walking big steps Side stepping in pbars Step turn reach in pbars Sit to stand trying without hands, very difficult for her, tends to fall backward, has knees collapse into valgus  10/03/23 Nustep level 4  x 5 minutes Gait outside around the back building one rest break and then up to the front door, with rollator and CGA Cone toe  touches Side stepping airex balance beam in Pbars Tandem gait on airex balance bean in pbars Step turn reach PWR moves in sitting Walking HHA faster pace 200 feet Direction changes with HHA  09/20/23 Nustep level 4 x 5 minutes  PATIENT EDUCATION: Education details: POC Person educated: Patient and Child(ren) Education method: Explanation Education comprehension: verbalized understanding   HOME EXERCISE PROGRAM: Spoke with her and her son about some walking in the house with walker, seated kicks, marches and toe taps 10/03/23 added PWR moves to HEP  GOALS: Goals reviewed with patient? Yes  SHORT TERM GOALS: Target date: 10/24/23  Independent with initial HEP  Goal status: met 10/09/23   LONG TERM GOALS: Target date: 11/27/23  Independent with advanced HEP Goal status: ongoing 11/15/23  2.  Decrease TUG time to 20 seconds Goal status: ongoing 11/15/23  3.  Increase BERG balance test score to 42/56 Goal status: ongoing 11/15/23  4. Walk 360 feet in the 3 minute walk test Goal status: ongoing 11/15/23  5.  Get up from sitting without using hands Goal status: ongoing 11/15/23  ASSESSMENT:  CLINICAL IMPRESSION: Patient is a 77 y.o. female who was seen today for physical therapy evaluation and treatment for Parkinson's and weakness.  She has not been in to PT since 5 weeks ago, she reports a fall prior to Thanksgiving and then a fall on December 6th, she reports significant bruising in the right hip area, she has not seen the MD.  She is walking fine with the rollator I was just close by due to the hx of falls.  She did not want to stand on the right LE completely, she was very tender in the right lateral hip and the right SI area.  I told her and her son my concerns of a fracture she could just have a lot of bruising and mm spasms, but to be safe it may be best to get an x-ray to be sure.  We discussed options for urgent care with ability to perform and read x-rays .   She  had a fall in the bathroom in February and broke her right ribs. I suggested the x-rays to be sure and assure peace of mind of the there Christmas holiday Added 11/19/23:  X-ray was negative just significant bruising  OBJECTIVE IMPAIRMENTS Abnormal gait, decreased activity tolerance, decreased balance, decreased coordination, decreased endurance, decreased mobility, difficulty walking, decreased ROM, decreased strength, increased muscle spasms, impaired flexibility, improper body mechanics, postural dysfunction, and pain.   REHAB POTENTIAL: Good  CLINICAL DECISION MAKING: Stable/uncomplicated  EVALUATION COMPLEXITY: Low  PLAN: PT FREQUENCY: 1x/week  PT DURATION: 9 weeks  PLANNED INTERVENTIONS: Therapeutic exercises, Therapeutic activity, Neuromuscular re-education, Balance training, Gait training, Patient/Family education, Self Care, Joint mobilization, Stair training, Electrical stimulation, Spinal mobilization, Cryotherapy, Moist heat, and Manual therapy  PLAN FOR NEXT SESSION: see if she gets x-rays if negative will need to send request for more visits   Leva Baine W, PT 11/15/2023, 2:51 PM

## 2023-11-16 DIAGNOSIS — W133XXA Fall through floor, initial encounter: Secondary | ICD-10-CM | POA: Diagnosis not present

## 2023-11-16 DIAGNOSIS — M25531 Pain in right wrist: Secondary | ICD-10-CM | POA: Diagnosis not present

## 2023-11-16 DIAGNOSIS — M79641 Pain in right hand: Secondary | ICD-10-CM | POA: Diagnosis not present

## 2023-11-16 DIAGNOSIS — M25551 Pain in right hip: Secondary | ICD-10-CM | POA: Diagnosis not present

## 2023-11-16 DIAGNOSIS — S63501A Unspecified sprain of right wrist, initial encounter: Secondary | ICD-10-CM | POA: Diagnosis not present

## 2023-11-19 ENCOUNTER — Ambulatory Visit: Payer: Medicare Other | Admitting: Physical Therapy

## 2023-11-19 ENCOUNTER — Other Ambulatory Visit: Payer: Self-pay | Admitting: Family Medicine

## 2023-11-19 DIAGNOSIS — F321 Major depressive disorder, single episode, moderate: Secondary | ICD-10-CM

## 2023-11-19 NOTE — Telephone Encounter (Signed)
Name of Medication:  Clonazepam Name of Pharmacy:  [facility] Last [place] [facility] or Written Date and Quantity: 10/20/23, #30 Last Office Visit and Type:  07/11/23, 6 mo f/u Next Office Visit and Type:  01/15/24, CPE Last Controlled Substance Agreement Date:  04/28/16 Last UDS:  04/28/16

## 2023-11-19 NOTE — Telephone Encounter (Signed)
ERx 

## 2023-12-05 ENCOUNTER — Ambulatory Visit: Payer: Medicare Other | Attending: Neurology | Admitting: Physical Therapy

## 2023-12-05 ENCOUNTER — Encounter: Payer: Self-pay | Admitting: Physical Therapy

## 2023-12-05 DIAGNOSIS — M5459 Other low back pain: Secondary | ICD-10-CM | POA: Diagnosis not present

## 2023-12-05 DIAGNOSIS — G20B1 Parkinson's disease with dyskinesia, without mention of fluctuations: Secondary | ICD-10-CM | POA: Diagnosis not present

## 2023-12-05 DIAGNOSIS — M6281 Muscle weakness (generalized): Secondary | ICD-10-CM | POA: Diagnosis not present

## 2023-12-05 DIAGNOSIS — R2681 Unsteadiness on feet: Secondary | ICD-10-CM | POA: Insufficient documentation

## 2023-12-05 DIAGNOSIS — R262 Difficulty in walking, not elsewhere classified: Secondary | ICD-10-CM | POA: Insufficient documentation

## 2023-12-05 NOTE — Therapy (Signed)
 OUTPATIENT PHYSICAL THERAPY NEURO TREATMENT   Patient Name: [name] MRN: [id number] DOB:1946-07-10, 78 y.o., female Today's Date: 12/05/2023   PCP: Rilla MART PROVIDER: [name]   PT End of Session - 12/05/23 1613     Visit Number 5    Date for PT Re-Evaluation 02/13/24    Authorization Type [employer] Medicare 1 of 10    PT Start Time 1613    PT Stop Time 1658    PT Time Calculation (min) 45 min    Activity Tolerance Patient tolerated treatment well    Behavior During Therapy [facility] for tasks assessed/performed             Past Medical History:  Diagnosis Date   Advanced care planning/counseling discussion 05/26/2015   Advanced directive - scanned 07/2021 - wants all children involved in her medical decision, starting with son [name]. Doesn't want prolonged life support if terminal condition however ok with tube feeding.     Allergy    Anemia 09/25/2007   With mild leukopenia, on iron and b12 replacement.  Consider SPEP     Anxiety    Arthritis    Blood transfusion without reported diagnosis    CAD (coronary artery disease) 01/2010   MI, Nishan   Cataract    CHF (congestive heart failure) (HCC)    with episode of sepsis   Chronic cough 07/16/2017   Increased gabapentin  to 100 tid from 100 qhs 09/04/2017  Plus max gerd rx   -  MBS 09/11/2017  Ok, rec barium swallow  - DgEs 09/21/2017 >>> Occasional secondary and tertiary contractions in the distal half of the esophagus, but primary peristaltic waves in the esophagus were essentially intact. No appreciable esophageal stricture or ulceration. In borderline slow but successful progress of the    Chronic insomnia 03/07/2016   Chronic, continuous use of opioids 12/07/2013   Closed fracture of rib of right side with routine healing 05/28/2021   Complication of anesthesia    Constipation 08/06/2018   DDD (degenerative disc disease), lumbar 05/31/2013   Lspine 2011 - mild DDD  S/p laminectomy and foraminotomy early 2021  Humphrey)  S/p lumbar laminectomy 04/2022 (Dr Dawley)      Degenerative lumbar spinal stenosis 11/27/2019   Depression    not recently   Dysphagia 10/09/2017   Dysuria 09/02/2013   Encounter for general adult medical examination with abnormal findings 05/21/2014   Family circumstance 02/23/2015   Fibromyalgia    GAD (generalized anxiety disorder) 04/28/2016   GERD 02/22/2010   Glaucoma 02/2013   [facility]   Headache    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   History of colon polyps 2004   HLD (hyperlipidemia) 05/20/2013   Hypercalcemia 06/09/2023   Hypertension 05/20/2023   HYPOTENSION, ORTHOSTATIC 12/06/2008   Interstitial cystitis    [facility]tt[facility] now Dr Janit   Knee pain, bilateral 08/06/2018   Localized swelling of chest wall 12/23/2021   Lumbar radiculopathy, chronic 05/16/2022   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/08/2010   MDD (major depressive disorder), single episode, moderate (HCC) 11/12/2013   Medicare annual wellness visit, subsequent 05/20/2013   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  Prior yearly reclast , prolia  started 03/2017  DEXA 10/2019 - T -2.8 hip, -0.1 spine  DEXA 09/2022 - T -3.1 L forearm, -2.6 L femur neck     Last prolia  injection: 09/21/2022 - when priced out 03/2023, too expensive for patient. 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   Dr Zaldivar plastic surgery     Paraparesis (HCC) 07/25/2023   Parkinson's disease (HCC) 08/25/2015   Dx Dr [name] 07/2015    Pneumonia    PONV (postoperative nausea and vomiting)    Pre-op evaluation 04/26/2022   Rash and other nonspecific skin eruption 05/11/2010   Annotation: thought to be  autoimmune---responsive to cellcept   Has had eval at two tertiary centers  Rash is responsive to cellcept      Recurrent UTI 01/03/2011   UTI history:  12/2022 - pansensitive E coli treated with kefiex 7d course  01/2023 - pansensitive E coli treated with keflex  3d course   04/2023 - abnormal UA/micro at [facility] treated with keflex  5d course, UCx not sent     Right leg weakness 07/25/2023   Right lumbar radiculopathy 04/10/2018   MRI Lumbar spine 10/2018:   Multilevel DDD and facet degeneration with some resultant spinal and foraminal stenosis most prominent L5/S1 with R protrusion impinging on L5 nerve root, shallow central protrusion.   Saw [facility]) referred to Dr Bonner PMR 11/2018  LESI x3 with mild benefit 12/2018  S/p laminectomy/foraminotomy 2021 [facility])  S/p lumbar laminectomy 04/2022 (Dr Dawley)      Syncope 01/03/2011   Zio patch 05/2023:  HR 58 - 171 bpm, average 78 bpm.  7 nonsustained SVT, longest 6 beats.  Rare supraventricular and ventricular ectopy.  No sustained arrhythmias.  No atrial fibrillation.     Takotsubo cardiomyopathy 2008   due to E coli urosepsis   Urinary incontinence 01/12/2023   Valvular heart disease 07/11/2023   Echocardiogram 05/21/2023 at [facility]  LVEF 55-60%, normal wall motion, impaired relaxation pattern consistent with age related change, mild MR, tr TR, mild aortic sclerosis without stenosis, mild-mod AR, fair quality      Visual changes 05/20/2023   Vitamin B12 deficiency 05/03/2007   IF negative (02/2017)     Vitamin D  deficiency 05/09/2013   Past Surgical History:  Procedure Laterality Date   ABDOMINAL HYSTERECTOMY  1970s   IUD infection - first partial then with oophorectomy (cysts), complication - low blood pressure   CATARACT EXTRACTION Bilateral    CHOLECYSTECTOMY     complication - low blood pressure   COLONOSCOPY  06/2008   h/o polyps but latest WNL, rec rpt 10 yrs Priscilla)   COLONOSCOPY  11/2018   multiple TAs (10 polyps  total), rpt 2 yrs Oma)   COLONOSCOPY  04/2021   multiple TAs, rpt 3 yrs Oma)   CYSTOSCOPY  12/2013   abx treatment for recurrent cystitis   DEXA  04/2013   T -2.9 @ femur, -1.6 @ spine   DEXA  04/2017   T -2.9 hip, -0.7 spine   ESOPHAGOGASTRODUODENOSCOPY  12/2017   WNL, regardless esophagus dilated, small HH Oma)   EYE SURGERY     LUMBAR LAMINECTOMY/DECOMPRESSION MICRODISCECTOMY Right 11/27/2019   Right Lumbar Four-Five foraminotomy;  [facility] Pac, MD)   LUMBAR LAMINECTOMY/DECOMPRESSION MICRODISCECTOMY Right 05/16/2022   Procedure: OPEN LUMBAR LAMINECTOMY, RT, L45 W/LATERAL RECESS DECOMPRESSION;  Surgeon: Carollee Lani BROCKS, DO;  Location: [facility] OR;  Service: Neurosurgery;  Laterality: Right;  3C   MINOR PLACEMENT OF FIDUCIAL N/A 06/30/2021   Procedure: MINOR PLACEMENT OF  FIDUCIAL;  Surgeon: Unice Pac, MD;  Location: [facility] OR;  Service: Neurosurgery;  Laterality: N/A;  Minor room   PTOSIS REPAIR Bilateral 10/2020   Plastic Surgery   PULSE GENERATOR IMPLANT Right 07/14/2021   Procedure: UNILATERAL PULSE GENERATOR IMPLANT;  Surgeon: Unice Pac, MD;  Location: [facility] OR;  Service: Neurosurgery;  Laterality: Right;   SUBTHALAMIC STIMULATOR INSERTION Bilateral 07/07/2021   Procedure: Deep brain stimulator placement;  Surgeon: Unice Pac, MD;  Location: [facility] OR;  Service: Neurosurgery;  Laterality: Bilateral;   TUBAL LIGATION     UPPER GASTROINTESTINAL ENDOSCOPY     Patient Active Problem List   Diagnosis Date Noted   Palpitations 07/27/2023   Moderate aortic regurgitation 07/27/2023   Paraparesis (HCC) 07/25/2023   Right leg weakness 07/25/2023   Allergy    Anxiety    Arthritis    Blood transfusion without reported diagnosis    Cataract    CHF (congestive heart failure) (HCC)    Complication of anesthesia    Depression    Headache    Interstitial cystitis    Pneumonia    PONV (postoperative nausea and vomiting)    Neck pain 07/11/2023   Valvular heart disease 07/11/2023    Hypercalcemia 06/09/2023   Hypertension 05/20/2023   Visual changes 05/20/2023   Urinary incontinence 01/12/2023   Lumbar radiculopathy, chronic 05/16/2022   Pre-op evaluation 04/26/2022   Localized swelling of chest wall 12/23/2021   Other fatigue 11/25/2021   Closed fracture of rib of right side with routine healing 05/28/2021   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   Degenerative lumbar spinal stenosis 11/27/2019   Knee pain, bilateral 08/06/2018   Constipation 08/06/2018   Right lumbar radiculopathy 04/10/2018   Dysphagia 10/09/2017   Chronic cough 07/16/2017   GAD (generalized anxiety disorder) 04/28/2016   Chronic insomnia 03/07/2016   Parkinson's disease (HCC) 08/25/2015   Advanced care planning/counseling discussion 05/26/2015   Family circumstance 02/23/2015   Encounter for general adult medical examination with abnormal findings 05/21/2014   Chronic, continuous use of opioids 12/07/2013   MDD (major depressive disorder), single episode, moderate (HCC) 11/12/2013   Dysuria 09/02/2013   DDD (degenerative disc disease), lumbar 05/31/2013   Medicare annual wellness visit, subsequent 05/20/2013   HLD (hyperlipidemia) 05/20/2013   Vitamin D  deficiency 05/09/2013   Glaucoma 02/2013   Recurrent UTI 01/03/2011   Syncope 01/03/2011   Rash and other nonspecific skin eruption 05/11/2010   GERD 02/22/2010   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/08/2010   Osteoporosis 11/10/2009   Orthostatic hypotension 12/06/2008   Anemia 09/25/2007   Vitamin B12 deficiency 05/03/2007   Fibromyalgia 05/03/2007   Takotsubo cardiomyopathy 2008   History of colon polyps 2004    ONSET DATE: 06/28/2022  REFERRING DIAG: Parkinson's, S/P lumbar surgery  THERAPY DIAG:  Unsteadiness on feet  Parkinson's disease with dyskinesia, unspecified whether  manifestations fluctuate (HCC)  Muscle weakness (generalized)  Difficulty in walking, not elsewhere classified  Other low back pain  Rationale for Evaluation and Treatment Rehabilitation  SUBJECTIVE:  SUBJECTIVE STATEMENT: Patient did see the MD after the fall, x-rays negative, she reports that the pain is better and she has not had any falls Pt accompanied by: son  PERTINENT HISTORY: see above, did have lumbar surgery in June, has a deep brain stimulator for movement disorder  PAIN:  Are you having pain? Yes: NPRS scale: 2/10 Pain location: lright rib area Pain description: ache/sore Aggravating factors: bending, lifting,doing too much pain can be 6/10 Relieving factors: rest can be 0/10  PrecAUTIONS: None  WEIGHT BEARING RESTRICTIONS No  FALLS: Has patient fallen in last 6 months? Yes. Number of falls 1  LIVING ENVIRONMENT: Lives with: lives with their family Lives in: House/apartment Stairs: Yes: Internal: 12 steps; can reach both Has following equipment at home: Single point cane and Walker - 4 wheeled  PLOF: Independent, does some housework  PATIENT GOALS:  be stronger, walk longer and better, have less episodes of freezing  OBJECTIVE:   COGNITION: Overall cognitive status: Within functional limits for tasks assessed   SENSATION: WFL   POSTURE: rounded shoulders, forward head, and decreased lumbar lordosis  LOWER EXTREMITY ROM:    Motions are WFL's but she has poor control of the right LE, weakness and dyskinesia  LOWER EXTREMITY MMT:    MMT Right Eval Left Eval  Hip flexion 4- 4-  Hip extension 4- 4-  Hip abduction 4- 4-  Hip adduction 4- 4-  Hip internal rotation    Hip external rotation    Knee flexion 4- 4  Knee extension 4- 4  Ankle dorsiflexion 4- 4-   Ankle plantarflexion    Ankle inversion    Ankle eversion 4- 4-  (Blank rows = not tested)  TRANSFERS:  Has to use hands, if not falls back into chair, had to have multiple attempts at times due to falling back in chair  GAIT: Gait pattern: has extraneous motions of the right arm and the right leg and the shoulders and head at times when we walked with HHA, reports feels unsteady, she is shuffling her feet bilaterally today Distance walked: 200 feet Assistive device utilized: Walker - 4 wheeled and None Level of assistance: Complete Independence and CGA Comments: on stairs does one at a time  FUNCTIONAL TESTs:  5 times sit to stand: 56 seconds, has to use hands tends to fall backward multiple times during the attempts. Timed up and go (TUG): 60 seconds, with 4WW, uses hands to push up from sitting, has trouble initiating the first step BERG balance 12/56 3 minute walk test 200 feet with 4WW  TODAY'S TREATMENT:  12/05/23 Bike level 3 x 5 minutes Nustep level 5 x 5 minutes Turn step touch On airex touching numbers on wall called out Side step on and off airex [employer] Direction changes PWR moves in sitting Ball kicks  11/15/23 Gait with rollator with CGA/SBA Seated toe taps and heel raises Seated hip add/abduction LAQ Assessment of the right hip and knee see below  10/09/23 Nustep level 4 x 5 minutes Gait outside light HHA some cues for step length and speed, had a time when she was shuffling, did around the back building 2.5# LAQ, marches 2x10 Standing hip abduction 2.5# Cone toe touches with HHA and then without In Pbars fwd walking and backward walking big steps Side stepping in pbars Step turn reach in pbars Sit to stand trying without hands, very difficult for her, tends to fall backward, has knees collapse into valgus  10/03/23 Nustep level 4 x  5 minutes Gait outside around the back building one rest break and then up to the front door, with rollator and  CGA Cone toe touches Side stepping airex balance beam in Pbars Tandem gait on airex balance bean in pbars Step turn reach PWR moves in sitting Walking HHA faster pace 200 feet Direction changes with HHA  09/20/23 Nustep level 4 x 5 minutes  PATIENT EDUCATION: Education details: POC Person educated: Patient and Child(ren) Education method: Explanation Education comprehension: verbalized understanding   HOME EXERCISE PROGRAM: Spoke with her and her son about some walking in the house with walker, seated kicks, marches and toe taps 10/03/23 added PWR moves to HEP  GOALS: Goals reviewed with patient? Yes  SHORT TERM GOALS: Target date: 10/24/23  Independent with initial HEP  Goal status: met 10/09/23   LONG TERM GOALS: Target date: 11/27/23  Independent with advanced HEP Goal status: ongoing 11/15/23  2.  Decrease TUG time to 20 seconds Goal status: ongoing 11/15/23  3.  Increase BERG balance test score to 42/56 Goal status: ongoing 11/15/23  4. Walk 360 feet in the 3 minute walk test Goal status: ongoing 11/15/23  5.  Get up from sitting without using hands Goal status: ongoing 12/05/23  ASSESSMENT:  CLINICAL IMPRESSION: Patient is a 78 y.o. female who was seen today for physical therapy evaluation and treatment for Parkinson's and weakness.  She has not been in to PT since 5 weeks ago, she reports a fall prior to Thanksgiving and then a fall on December 6th, she reports significant bruising in the right hip area.  Added 11/19/23:  X-ray was negative just significant bruising I continued with balance, walking and some parkinsons exercises, she did well with this today and only had one instance of LOB  OBJECTIVE IMPAIRMENTS Abnormal gait, decreased activity tolerance, decreased balance, decreased coordination, decreased endurance, decreased mobility, difficulty walking, decreased ROM, decreased strength, increased muscle spasms, impaired flexibility, improper body  mechanics, postural dysfunction, and pain.   REHAB POTENTIAL: Good  CLINICAL DECISION MAKING: Stable/uncomplicated  EVALUATION COMPLEXITY: Low  PLAN: PT FREQUENCY: 1x/week  PT DURATION: 9 weeks  PLANNED INTERVENTIONS: Therapeutic exercises, Therapeutic activity, Neuromuscular re-education, Balance training, Gait training, Patient/Family education, Self Care, Joint mobilization, Stair training, Electrical stimulation, Spinal mobilization, Cryotherapy, Moist heat, and Manual therapy  PLAN FOR NEXT SESSION: Will continue to work on her function and movement  Yehudit Fulginiti W, PT 12/05/2023, 4:26 PM

## 2023-12-12 ENCOUNTER — Encounter: Payer: Self-pay | Admitting: Physical Therapy

## 2023-12-12 ENCOUNTER — Ambulatory Visit: Payer: Medicare Other | Admitting: Physical Therapy

## 2023-12-12 DIAGNOSIS — M6281 Muscle weakness (generalized): Secondary | ICD-10-CM

## 2023-12-12 DIAGNOSIS — R262 Difficulty in walking, not elsewhere classified: Secondary | ICD-10-CM | POA: Diagnosis not present

## 2023-12-12 DIAGNOSIS — R2681 Unsteadiness on feet: Secondary | ICD-10-CM

## 2023-12-12 DIAGNOSIS — M5459 Other low back pain: Secondary | ICD-10-CM | POA: Diagnosis not present

## 2023-12-12 DIAGNOSIS — G20B1 Parkinson's disease with dyskinesia, without mention of fluctuations: Secondary | ICD-10-CM | POA: Diagnosis not present

## 2023-12-12 NOTE — Therapy (Signed)
 OUTPATIENT PHYSICAL THERAPY NEURO TREATMENT   Patient Name: [name] MRN: [id number] DOB:January 28, 1946, 78 y.o., female Today's Date: 12/12/2023   PCP: Errol Heaps PROVIDER: [name]   PT End of Session - 12/12/23 1400     Visit Number 6    Date for PT Re-Evaluation 02/13/24    Authorization Type [employer] Medicare 2 of 10    Activity Tolerance Patient tolerated treatment well    Behavior During Therapy [facility] for tasks assessed/performed             Past Medical History:  Diagnosis Date   Advanced care planning/counseling discussion 05/26/2015   Advanced directive - scanned 07/2021 - wants all children involved in her medical decision, starting with son [name]. Doesn't want prolonged life support if terminal condition however ok with tube feeding.     Allergy    Anemia 09/25/2007   With mild leukopenia, on iron and b12 replacement.  Consider SPEP     Anxiety    Arthritis    Blood transfusion without reported diagnosis    CAD (coronary artery disease) 01/2010   MI, Nishan   Cataract    CHF (congestive heart failure) (HCC)    with episode of sepsis   Chronic cough 07/16/2017   Increased gabapentin  to 100 tid from 100 qhs 09/04/2017  Plus max gerd rx   -  MBS 09/11/2017  Ok, rec barium swallow  - DgEs 09/21/2017 >>> Occasional secondary and tertiary contractions in the distal half of the esophagus, but primary peristaltic waves in the esophagus were essentially intact. No appreciable esophageal stricture or ulceration. In borderline slow but successful progress of the    Chronic insomnia 03/07/2016   Chronic, continuous use of opioids 12/07/2013   Closed fracture of rib of right side with routine healing 05/28/2021   Complication of anesthesia    Constipation 08/06/2018   DDD (degenerative disc disease), lumbar 05/31/2013   Lspine 2011 - mild DDD  S/p laminectomy and foraminotomy early 2021 Nigel Bart)  S/p lumbar laminectomy 04/2022 (Dr Dawley)      Degenerative lumbar spinal  stenosis 11/27/2019   Depression    not recently   Dysphagia 10/09/2017   Dysuria 09/02/2013   Encounter for general adult medical examination with abnormal findings 05/21/2014   Family circumstance 02/23/2015   Fibromyalgia    GAD (generalized anxiety disorder) 04/28/2016   GERD 02/22/2010   Glaucoma 02/2013   [facility]   Headache    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   History of colon polyps 2004   HLD (hyperlipidemia) 05/20/2013   Hypercalcemia 06/09/2023   Hypertension 05/20/2023   HYPOTENSION, ORTHOSTATIC 12/06/2008   Interstitial cystitis    [place] now Dr Luster Salters   Knee pain, bilateral 08/06/2018   Localized swelling of chest wall 12/23/2021   Lumbar radiculopathy, chronic 05/16/2022   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/08/2010   MDD (major depressive disorder), single episode, moderate (HCC) 11/12/2013   Medicare annual wellness visit, subsequent 05/20/2013   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  Prior yearly reclast , prolia  started 03/2017  DEXA 10/2019 - T -2.8 hip, -0.1 spine  DEXA 09/2022 - T -3.1 L forearm, -2.6 L femur neck     Last prolia  injection: 09/21/2022 - when priced out  03/2023, too expensive for patient. 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   Dr Zaldivar plastic surgery     Paraparesis (HCC) 07/25/2023   Parkinson's disease (HCC) 08/25/2015   Dx Dr [name] 07/2015    Pneumonia    PONV (postoperative nausea and vomiting)    Pre-op evaluation 04/26/2022   Rash and other nonspecific skin eruption 05/11/2010   Annotation: thought to be autoimmune---responsive to cellcept   Has had eval at two tertiary centers  Rash is responsive to  cellcept      Recurrent UTI 01/03/2011   UTI history:  12/2022 - pansensitive E coli treated with kefiex 7d course  01/2023 - pansensitive E coli treated with keflex  3d course   04/2023 - abnormal UA/micro at [facility] treated with keflex  5d course, UCx not sent     Right leg weakness 07/25/2023   Right lumbar radiculopathy 04/10/2018   MRI Lumbar spine 10/2018:   Multilevel DDD and facet degeneration with some resultant spinal and foraminal stenosis most prominent L5/S1 with R protrusion impinging on L5 nerve root, shallow central protrusion.   Saw [facility]n Georges) referred to Dr Rexanne Catalina PMR 11/2018  LESI x3 with mild benefit 12/2018  S/p laminectomy/foraminotomy 2021 Nigel Bart)  S/p lumbar laminectomy 04/2022 (Dr Dawley)      Syncope 01/03/2011   Zio patch 05/2023:  HR 58 - 171 bpm, average 78 bpm.  7 nonsustained SVT, longest 6 beats.  Rare supraventricular and ventricular ectopy.  No sustained arrhythmias.  No atrial fibrillation.     Takotsubo cardiomyopathy 2008   due to E coli urosepsis   Urinary incontinence 01/12/2023   Valvular heart disease 07/11/2023   Echocardiogram 05/21/2023 at [facility]  LVEF 55-60%, normal wall motion, impaired relaxation pattern consistent with age related change, mild MR, tr TR, mild aortic sclerosis without stenosis, mild-mod AR, fair quality      Visual changes 05/20/2023   Vitamin B12 deficiency 05/03/2007   IF negative (02/2017)     Vitamin D  deficiency 05/09/2013   Past Surgical History:  Procedure Laterality Date   ABDOMINAL HYSTERECTOMY  1970s   IUD infection - first partial then with oophorectomy (cysts), complication - low blood pressure   CATARACT EXTRACTION Bilateral    CHOLECYSTECTOMY     complication - low blood pressure   COLONOSCOPY  06/2008   h/o polyps but latest WNL, rec rpt 10 yrs [place] Lax)   COLONOSCOPY  11/2018   multiple TAs (10 polyps total), rpt 2 yrs Sandrea Cruel)   COLONOSCOPY  04/2021   multiple TAs, rpt 3 yrs Sandrea Cruel)   CYSTOSCOPY   12/2013   abx treatment for recurrent cystitis   DEXA  04/2013   T -2.9 @ femur, -1.6 @ spine   DEXA  04/2017   T -2.9 hip, -0.7 spine   ESOPHAGOGASTRODUODENOSCOPY  12/2017   WNL, regardless esophagus dilated, small HH Sandrea Cruel)   EYE SURGERY     LUMBAR LAMINECTOMY/DECOMPRESSION MICRODISCECTOMY Right 11/27/2019   Right Lumbar Four-Five foraminotomy;  Manya Sells, MD)   LUMBAR LAMINECTOMY/DECOMPRESSION MICRODISCECTOMY Right 05/16/2022   Procedure: OPEN LUMBAR LAMINECTOMY, RT, L45 W/LATERAL RECESS DECOMPRESSION;  Surgeon: Pincus Bridgeman, DO;  Location: [facility] OR;  Service: Neurosurgery;  Laterality: Right;  3C   MINOR PLACEMENT OF FIDUCIAL N/A 06/30/2021   Procedure: MINOR PLACEMENT OF FIDUCIAL;  Surgeon: Manya Sells, MD;  Location: [facility] OR;  Service: Neurosurgery;  Laterality: N/A;  Minor room   PTOSIS REPAIR  Bilateral 10/2020   Plastic Surgery   PULSE GENERATOR IMPLANT Right 07/14/2021   Procedure: UNILATERAL PULSE GENERATOR IMPLANT;  Surgeon: Manya Sells, MD;  Location: [facility] OR;  Service: Neurosurgery;  Laterality: Right;   SUBTHALAMIC STIMULATOR INSERTION Bilateral 07/07/2021   Procedure: Deep brain stimulator placement;  Surgeon: Manya Sells, MD;  Location: [facility] OR;  Service: Neurosurgery;  Laterality: Bilateral;   TUBAL LIGATION     UPPER GASTROINTESTINAL ENDOSCOPY     Patient Active Problem List   Diagnosis Date Noted   Palpitations 07/27/2023   Moderate aortic regurgitation 07/27/2023   Paraparesis (HCC) 07/25/2023   Right leg weakness 07/25/2023   Allergy    Anxiety    Arthritis    Blood transfusion without reported diagnosis    Cataract    CHF (congestive heart failure) (HCC)    Complication of anesthesia    Depression    Headache    Interstitial cystitis    Pneumonia    PONV (postoperative nausea and vomiting)    Neck pain 07/11/2023   Valvular heart disease 07/11/2023   Hypercalcemia 06/09/2023   Hypertension 05/20/2023   Visual changes 05/20/2023   Urinary  incontinence 01/12/2023   Lumbar radiculopathy, chronic 05/16/2022   Pre-op evaluation 04/26/2022   Localized swelling of chest wall 12/23/2021   Other fatigue 11/25/2021   Closed fracture of rib of right side with routine healing 05/28/2021   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   Degenerative lumbar spinal stenosis 11/27/2019   Knee pain, bilateral 08/06/2018   Constipation 08/06/2018   Right lumbar radiculopathy 04/10/2018   Dysphagia 10/09/2017   Chronic cough 07/16/2017   GAD (generalized anxiety disorder) 04/28/2016   Chronic insomnia 03/07/2016   Parkinson's disease (HCC) 08/25/2015   Advanced care planning/counseling discussion 05/26/2015   Family circumstance 02/23/2015   Encounter for general adult medical examination with abnormal findings 05/21/2014   Chronic, continuous use of opioids 12/07/2013   MDD (major depressive disorder), single episode, moderate (HCC) 11/12/2013   Dysuria 09/02/2013   DDD (degenerative disc disease), lumbar 05/31/2013   Medicare annual wellness visit, subsequent 05/20/2013   HLD (hyperlipidemia) 05/20/2013   Vitamin D  deficiency 05/09/2013   Glaucoma 02/2013   Recurrent UTI 01/03/2011   Syncope 01/03/2011   Rash and other nonspecific skin eruption 05/11/2010   GERD 02/22/2010   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/08/2010   Osteoporosis 11/10/2009   Orthostatic hypotension 12/06/2008   Anemia 09/25/2007   Vitamin B12 deficiency 05/03/2007   Fibromyalgia 05/03/2007   Takotsubo cardiomyopathy 2008   History of colon polyps 2004    ONSET DATE: 06/28/2022  REFERRING DIAG: Parkinson's, S/P lumbar surgery  THERAPY DIAG:  Unsteadiness on feet  Parkinson's disease with dyskinesia, unspecified whether manifestations fluctuate (HCC)  Muscle weakness (generalized)  Difficulty in walking, not  elsewhere classified  Rationale for Evaluation and Treatment Rehabilitation  SUBJECTIVE:  SUBJECTIVE STATEMENT: Reports no falls, reports that she forgets the walker at times. Pt accompanied by: son  PERTINENT HISTORY: see above, did have lumbar surgery in June, has a deep brain stimulator for movement disorder  PAIN:  Are you having pain? Yes: NPRS scale: 2/10 Pain location: lright rib area Pain description: ache/sore Aggravating factors: bending, lifting,doing too much pain can be 6/10 Relieving factors: rest can be 0/10  PrecAUTIONS: None  WEIGHT BEARING RESTRICTIONS No  FALLS: Has patient fallen in last 6 months? Yes. Number of falls 1  LIVING ENVIRONMENT: Lives with: lives with their family Lives in: House/apartment Stairs: Yes: Internal: 12 steps; can reach both Has following equipment at home: Single point cane and Walker - 4 wheeled  PLOF: Independent, does some housework  PATIENT GOALS:  be stronger, walk longer and better, have less episodes of "freezing"  OBJECTIVE:   COGNITION: Overall cognitive status: Within functional limits for tasks assessed   SENSATION: WFL   POSTURE: rounded shoulders, forward head, and decreased lumbar lordosis  LOWER EXTREMITY ROM:    Motions are WFL's but she has poor control of the right LE, weakness and dyskinesia  LOWER EXTREMITY MMT:    MMT Right Eval Left Eval  Hip flexion 4- 4-  Hip extension 4- 4-  Hip abduction 4- 4-  Hip adduction 4- 4-  Hip internal rotation    Hip external rotation    Knee flexion 4- 4  Knee extension 4- 4  Ankle dorsiflexion 4- 4-  Ankle plantarflexion    Ankle inversion    Ankle eversion 4- 4-  (Blank rows = not tested)  TRANSFERS:  Has to use hands, if not falls back into chair, had to have  multiple attempts at times due to falling back in chair  GAIT: Gait pattern: has extraneous motions of the right arm and the right leg and the shoulders and head at times when we walked with HHA, reports feels unsteady, she is shuffling her feet bilaterally today Distance walked: 200 feet Assistive device utilized: Walker - 4 wheeled and None Level of assistance: Complete Independence and CGA Comments: on stairs does one at a time  FUNCTIONAL TESTs:  5 times sit to stand: 56 seconds, has to use hands tends to fall backward multiple times during the attempts. Timed up and go (TUG): 60 seconds, with 4WW, uses hands to push up from sitting, has trouble initiating the first step BERG balance 12/56 3 minute walk test 200 feet with 4WW  TODAY'S TREATMENT:  12/12/23 Nustep level 5 x 7 minutes Bike level 3 x 6 minutes Tmill 1.1 mph with a lot of cues for step length, heel strike UBE level 3 x 5 mintues Sidestepping on airex balance beam On airex reaching for numbers On airex eyes closed On airex head movements up and down and side to side Walking HHA working on step length, heel strike and speed Sit to stand trying with no hands Leg press 20# 2x10  12/05/23 Bike level 3 x 5 minutes Nustep level 5 x 5 minutes Turn step touch On airex touching numbers on wall called out Side step on and off airex [employer] Direction changes PWR moves in sitting Ball kicks  11/15/23 Gait with rollator with CGA/SBA Seated toe taps and heel raises Seated hip add/abduction LAQ Assessment of the right hip and knee see below  10/09/23 Nustep level 4 x 5 minutes Gait outside light HHA some cues for step length and speed, had a time when she  was shuffling, did around the back building 2.5# LAQ, marches 2x10 Standing hip abduction 2.5# Cone toe touches with HHA and then without In Pbars fwd walking and backward walking big steps Side stepping in pbars Step turn reach in pbars Sit to stand trying  without hands, very difficult for her, tends to fall backward, has knees collapse into valgus  10/03/23 Nustep level 4 x 5 minutes Gait outside around the back building one rest break and then up to the front door, with rollator and CGA Cone toe touches Side stepping airex balance beam in Pbars Tandem gait on airex balance bean in pbars Step turn reach PWR moves in sitting Walking HHA faster pace 200 feet Direction changes with HHA  09/20/23 Nustep level 4 x 5 minutes  PATIENT EDUCATION: Education details: POC Person educated: Patient and Child(ren) Education method: Explanation Education comprehension: verbalized understanding   HOME EXERCISE PROGRAM: Spoke with her and her son about some walking in the house with walker, seated kicks, marches and toe taps 10/03/23 added PWR moves to HEP  GOALS: Goals reviewed with patient? Yes  SHORT TERM GOALS: Target date: 10/24/23  Independent with initial HEP  Goal status: met 10/09/23   LONG TERM GOALS: Target date: 11/27/23  Independent with advanced HEP Goal status: ongoing 11/15/23  2.  Decrease TUG time to 20 seconds Goal status: ongoing 11/15/23  3.  Increase BERG balance test score to 42/56 Goal status: ongoing 11/15/23  4. Walk 360 feet in the 3 minute walk test Goal status: ongoing 11/15/23  5.  Get up from sitting without using hands Goal status: ongoing 12/05/23  ASSESSMENT:  CLINICAL IMPRESSION: Patient is a 78 y.o. female who was seen today for physical therapy evaluation and treatment for Parkinson's and weakness.  I continued with balance, walking and some parkinsons exercises, we did have a long talk about safety and getting up and then going as I witnessed her doing too many things at once and her body trailing behind and causing an increase risk for falls OBJECTIVE IMPAIRMENTS Abnormal gait, decreased activity tolerance, decreased balance, decreased coordination, decreased endurance, decreased mobility,  difficulty walking, decreased ROM, decreased strength, increased muscle spasms, impaired flexibility, improper body mechanics, postural dysfunction, and pain.   REHAB POTENTIAL: Good  CLINICAL DECISION MAKING: Stable/uncomplicated  EVALUATION COMPLEXITY: Low  PLAN: PT FREQUENCY: 1x/week  PT DURATION: 9 weeks  PLANNED INTERVENTIONS: Therapeutic exercises, Therapeutic activity, Neuromuscular re-education, Balance training, Gait training, Patient/Family education, Self Care, Joint mobilization, Stair training, Electrical stimulation, Spinal mobilization, Cryotherapy, Moist heat, and Manual therapy  PLAN FOR NEXT SESSION: Will continue to work on her function and movement  Aniken Monestime W, PT 12/12/2023, 2:00 PM

## 2023-12-20 ENCOUNTER — Ambulatory Visit: Payer: Medicare Other | Admitting: Physical Therapy

## 2023-12-26 ENCOUNTER — Ambulatory Visit: Payer: Medicare Other | Admitting: Physical Therapy

## 2023-12-26 ENCOUNTER — Encounter: Payer: Self-pay | Admitting: Physical Therapy

## 2023-12-26 DIAGNOSIS — M6281 Muscle weakness (generalized): Secondary | ICD-10-CM | POA: Diagnosis not present

## 2023-12-26 DIAGNOSIS — M5459 Other low back pain: Secondary | ICD-10-CM | POA: Diagnosis not present

## 2023-12-26 DIAGNOSIS — R262 Difficulty in walking, not elsewhere classified: Secondary | ICD-10-CM

## 2023-12-26 DIAGNOSIS — G20B1 Parkinson's disease with dyskinesia, without mention of fluctuations: Secondary | ICD-10-CM

## 2023-12-26 DIAGNOSIS — R2681 Unsteadiness on feet: Secondary | ICD-10-CM | POA: Diagnosis not present

## 2023-12-26 NOTE — Therapy (Signed)
OUTPATIENT PHYSICAL THERAPY NEURO TREATMENT   Patient Name: [name] MRN: [id number] DOB:12-23-1945, 78 y.o., female Today's Date: 12/26/2023   PCP: Lydia Guiles PROVIDER: [name]   PT End of Session - 12/26/23 1352     Visit Number 7    Date for PT Re-Evaluation 02/13/24    Authorization Type [employer] Medicare 3 of 10    PT Start Time 1353    PT Stop Time 1444    PT Time Calculation (min) 51 min    Activity Tolerance Patient tolerated treatment well    Behavior During Therapy WFL for tasks assessed/performed             Past Medical History:  Diagnosis Date   Advanced care planning/counseling discussion 05/26/2015   Advanced directive - scanned 07/2021 - wants all children involved in her medical decision, starting with son [name]. Doesn't want prolonged life support if terminal condition however ok with tube feeding.     Allergy    Anemia 09/25/2007   With mild leukopenia, on iron and b12 replacement.  Consider SPEP     Anxiety    Arthritis    Blood transfusion without reported diagnosis    CAD (coronary artery disease) 01/2010   MI, Nishan   Cataract    CHF (congestive heart failure) (HCC)    with episode of sepsis   Chronic cough 07/16/2017   Increased gabapentin to 100 tid from 100 qhs 09/04/2017  Plus max gerd rx   -  MBS 09/11/2017  Ok, rec barium swallow  - DgEs 09/21/2017 >>> Occasional secondary and tertiary contractions in the distal half of the esophagus, but primary peristaltic waves in the esophagus were essentially intact. No appreciable esophageal stricture or ulceration. In borderline slow but successful progress of the    Chronic insomnia 03/07/2016   Chronic, continuous use of opioids 12/07/2013   Closed fracture of rib of right side with routine healing 05/28/2021   Complication of anesthesia    Constipation 08/06/2018   DDD (degenerative disc disease), lumbar 05/31/2013   Lspine 2011 - mild DDD  S/p laminectomy and foraminotomy early 2021  Venetia Maxon)  S/p lumbar laminectomy 04/2022 (Dr Dawley)      Degenerative lumbar spinal stenosis 11/27/2019   Depression    not recently   Dysphagia 10/09/2017   Dysuria 09/02/2013   Encounter for general adult medical examination with abnormal findings 05/21/2014   Family circumstance 02/23/2015   Fibromyalgia    GAD (generalized anxiety disorder) 04/28/2016   GERD 02/22/2010   Glaucoma 02/2013   [facility]   Headache    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   History of colon polyps 2004   HLD (hyperlipidemia) 05/20/2013   Hypercalcemia 06/09/2023   Hypertension 05/20/2023   HYPOTENSION, ORTHOSTATIC 12/06/2008   Interstitial cystitis    [place] now Dr Logan Bores   Knee pain, bilateral 08/06/2018   Localized swelling of chest wall 12/23/2021   Lumbar radiculopathy, chronic 05/16/2022   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/08/2010   MDD (major depressive disorder), single episode, moderate (HCC) 11/12/2013   Medicare annual wellness visit, subsequent 05/20/2013   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  Prior yearly reclast, prolia started 03/2017  DEXA 10/2019 - T -2.8 hip, -0.1 spine  DEXA 09/2022 - T -3.1 L forearm, -2.6 L femur neck     Last prolia injection: 09/21/2022 - when priced out 03/2023, too expensive for patient. 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---responsive to cellcept  Has had eval at two tertiary centers  Rash is responsive to cellcept     Recurrent UTI 01/03/2011   UTI history:  12/2022 - pansensitive E coli treated with kefiex 7d course  01/2023 - pansensitive E coli treated with keflex 3d course   04/2023 - abnormal UA/micro at [facility] treated with keflex 5d course, UCx not sent     Right leg weakness 07/25/2023   Right lumbar radiculopathy 04/10/2018   MRI Lumbar spine 10/2018:   Multilevel DDD and facet degeneration with some resultant spinal and foraminal stenosis most prominent L5/S1 with R protrusion impinging on L5 nerve root, shallow central protrusion.   Saw [facility] Shon Baton) referred to Dr Ethelene Hal PMR 11/2018  LESI x3 with mild benefit 12/2018  S/p laminectomy/foraminotomy 2021 Venetia Maxon)  S/p lumbar laminectomy 04/2022 (Dr Dawley)      Syncope 01/03/2011   Zio patch 05/2023:  HR 58 - 171 bpm, average 78 bpm.  7 nonsustained SVT, longest 6 beats.  Rare supraventricular and ventricular ectopy.  No sustained arrhythmias.  No atrial fibrillation.     Takotsubo cardiomyopathy 2008   due to E coli urosepsis   Urinary incontinence 01/12/2023   Valvular heart disease 07/11/2023   Echocardiogram 05/21/2023 at [facility]  LVEF 55-60%, normal wall motion, impaired relaxation pattern consistent with age related change, mild MR, tr TR, mild aortic sclerosis without stenosis, mild-mod AR, fair quality      Visual changes 05/20/2023   Vitamin B12 deficiency 05/03/2007   IF negative (02/2017)     Vitamin D deficiency 05/09/2013   Past Surgical History:  Procedure Laterality Date   ABDOMINAL HYSTERECTOMY  1970s   IUD infection - first partial then with oophorectomy (cysts), complication - low blood pressure   CATARACT EXTRACTION Bilateral    CHOLECYSTECTOMY     complication - low blood pressure   COLONOSCOPY  06/2008   h/o polyps but latest WNL, rec rpt 10 yrs Juanda Chance)   COLONOSCOPY  11/2018   multiple TAs (10 polyps  total), rpt 2 yrs Russella Dar)   COLONOSCOPY  04/2021   multiple TAs, rpt 3 yrs Russella Dar)   CYSTOSCOPY  12/2013   abx treatment for recurrent cystitis   DEXA  04/2013   T -2.9 @ femur, -1.6 @ spine   DEXA  04/2017   T -2.9 hip, -0.7 spine   ESOPHAGOGASTRODUODENOSCOPY  12/2017   WNL, regardless esophagus dilated, small HH Russella Dar)   EYE SURGERY     LUMBAR LAMINECTOMY/DECOMPRESSION MICRODISCECTOMY Right 11/27/2019   Right Lumbar Four-Five foraminotomy;  Maeola Harman, MD)   LUMBAR LAMINECTOMY/DECOMPRESSION MICRODISCECTOMY Right 05/16/2022   Procedure: OPEN LUMBAR LAMINECTOMY, RT, L45 W/LATERAL RECESS DECOMPRESSION;  Surgeon: Bethann Goo, DO;  Location: [facility] OR;  Service: Neurosurgery;  Laterality: Right;  3C   MINOR PLACEMENT OF FIDUCIAL N/A 06/30/2021   Procedure: MINOR PLACEMENT OF  FIDUCIAL;  Surgeon: Maeola Harman, MD;  Location: [facility] OR;  Service: Neurosurgery;  Laterality: N/A;  Minor room   PTOSIS REPAIR Bilateral 10/2020   Plastic Surgery   PULSE GENERATOR IMPLANT Right 07/14/2021   Procedure: UNILATERAL PULSE GENERATOR IMPLANT;  Surgeon: Maeola Harman, MD;  Location: [facility] OR;  Service: Neurosurgery;  Laterality: Right;   SUBTHALAMIC STIMULATOR INSERTION Bilateral 07/07/2021   Procedure: Deep brain stimulator placement;  Surgeon: Maeola Harman, MD;  Location: [facility] OR;  Service: Neurosurgery;  Laterality: Bilateral;   TUBAL LIGATION     UPPER GASTROINTESTINAL ENDOSCOPY     Patient Active Problem List   Diagnosis Date Noted   Palpitations 07/27/2023   Moderate aortic regurgitation 07/27/2023   Paraparesis (HCC) 07/25/2023   Right leg weakness 07/25/2023   Allergy    Anxiety    Arthritis    Blood transfusion without reported diagnosis    Cataract    CHF (congestive heart failure) (HCC)    Complication of anesthesia    Depression    Headache    Interstitial cystitis    Pneumonia    PONV (postoperative nausea and vomiting)    Neck pain 07/11/2023   Valvular heart disease 07/11/2023    Hypercalcemia 06/09/2023   Hypertension 05/20/2023   Visual changes 05/20/2023   Urinary incontinence 01/12/2023   Lumbar radiculopathy, chronic 05/16/2022   Pre-op evaluation 04/26/2022   Localized swelling of chest wall 12/23/2021   Other fatigue 11/25/2021   Closed fracture of rib of right side with routine healing 05/28/2021   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   Degenerative lumbar spinal stenosis 11/27/2019   Knee pain, bilateral 08/06/2018   Constipation 08/06/2018   Right lumbar radiculopathy 04/10/2018   Dysphagia 10/09/2017   Chronic cough 07/16/2017   GAD (generalized anxiety disorder) 04/28/2016   Chronic insomnia 03/07/2016   Parkinson's disease (HCC) 08/25/2015   Advanced care planning/counseling discussion 05/26/2015   Family circumstance 02/23/2015   Encounter for general adult medical examination with abnormal findings 05/21/2014   Chronic, continuous use of opioids 12/07/2013   MDD (major depressive disorder), single episode, moderate (HCC) 11/12/2013   Dysuria 09/02/2013   DDD (degenerative disc disease), lumbar 05/31/2013   Medicare annual wellness visit, subsequent 05/20/2013   HLD (hyperlipidemia) 05/20/2013   Vitamin D deficiency 05/09/2013   Glaucoma 02/2013   Recurrent UTI 01/03/2011   Syncope 01/03/2011   Rash and other nonspecific skin eruption 05/11/2010   GERD 02/22/2010   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/08/2010   Osteoporosis 11/10/2009   Orthostatic hypotension 12/06/2008   Anemia 09/25/2007   Vitamin B12 deficiency 05/03/2007   Fibromyalgia 05/03/2007   Takotsubo cardiomyopathy 2008   History of colon polyps 2004    ONSET DATE: 06/28/2022  REFERRING DIAG: Parkinson's, S/P lumbar surgery  THERAPY DIAG:  Unsteadiness on feet  Parkinson's disease with dyskinesia, unspecified whether  manifestations fluctuate (HCC)  Muscle weakness (generalized)  Difficulty in walking, not elsewhere classified  Other low back pain  Rationale for Evaluation and Treatment Rehabilitation  SUBJECTIVE:  SUBJECTIVE STATEMENT: Reports no falls, reports that she reports not doing much due to cold weather. Pt accompanied by: son  PERTINENT HISTORY: see above, did have lumbar surgery in June, has a deep brain stimulator for movement disorder  PAIN:  Are you having pain? Yes: NPRS scale: 2/10 Pain location: lright rib area Pain description: ache/sore Aggravating factors: bending, lifting,doing too much pain can be 6/10 Relieving factors: rest can be 0/10  PrecAUTIONS: None  WEIGHT BEARING RESTRICTIONS No  FALLS: Has patient fallen in last 6 months? Yes. Number of falls 1  LIVING ENVIRONMENT: Lives with: lives with their family Lives in: House/apartment Stairs: Yes: Internal: 12 steps; can reach both Has following equipment at home: Single point cane and Walker - 4 wheeled  PLOF: Independent, does some housework  PATIENT GOALS:  be stronger, walk longer and better, have less episodes of "freezing"  OBJECTIVE:   COGNITION: Overall cognitive status: Within functional limits for tasks assessed   SENSATION: WFL   POSTURE: rounded shoulders, forward head, and decreased lumbar lordosis  LOWER EXTREMITY ROM:    Motions are WFL's but she has poor control of the right LE, weakness and dyskinesia  LOWER EXTREMITY MMT:    MMT Right Eval Left Eval  Hip flexion 4- 4-  Hip extension 4- 4-  Hip abduction 4- 4-  Hip adduction 4- 4-  Hip internal rotation    Hip external rotation    Knee flexion 4- 4  Knee extension 4- 4  Ankle dorsiflexion 4- 4-  Ankle plantarflexion    Ankle inversion     Ankle eversion 4- 4-  (Blank rows = not tested)  TRANSFERS:  Has to use hands, if not falls back into chair, had to have multiple attempts at times due to falling back in chair  GAIT: Gait pattern: has extraneous motions of the right arm and the right leg and the shoulders and head at times when we walked with HHA, reports feels unsteady, she is shuffling her feet bilaterally today Distance walked: 200 feet Assistive device utilized: Walker - 4 wheeled and None Level of assistance: Complete Independence and CGA Comments: on stairs does one at a time  FUNCTIONAL TESTs:  5 times sit to stand: 56 seconds, has to use hands tends to fall backward multiple times during the attempts. Timed up and go (TUG): 60 seconds, with 4WW, uses hands to push up from sitting, has trouble initiating the first step BERG balance 12/56 3 minute walk test 200 feet with 4WW  TODAY'S TREATMENT:  12/26/23 Nustep level 5 x 5 minutes   UBE level 3 x 4 minutes   Gait outside around the parking [place] with rollator and SBA, some CGA for curbs and bumps in the side walk as she is a little dangerous with this. Standing in pbars reaching for numbers, then did this on airex Sit to stand practice without hands used weighted ball in hands, struggled with this today Leg extension 5# 2x10 20# leg curls 2x10 Leg press 20# 2x10 Educated on sit to stand at home in the lift shair halfway up   12/12/23 Nustep level 5 x 7 minutes Bike level 3 x 6 minutes Tmill 1.1 mph with a lot of cues for step length, heel strike UBE level 3 x 5 mintues Sidestepping on airex balance beam On airex reaching for numbers On airex eyes closed On airex head movements up and down and side to side Walking HHA working on step length, heel strike and speed Sit  to stand trying with no hands Leg press 20# 2x10  12/05/23 Bike level 3 x 5 minutes Nustep level 5 x 5 minutes Turn step touch On airex touching numbers on wall called out Side step  on and off airex [employer] Direction changes PWR moves in sitting Ball kicks  11/15/23 Gait with rollator with CGA/SBA Seated toe taps and heel raises Seated hip add/abduction LAQ Assessment of the right hip and knee see below  10/09/23 Nustep level 4 x 5 minutes Gait outside light HHA some cues for step length and speed, had a time when she was shuffling, did around the back building 2.5# LAQ, marches 2x10 Standing hip abduction 2.5# Cone toe touches with HHA and then without In Pbars fwd walking and backward walking big steps Side stepping in pbars Step turn reach in pbars Sit to stand trying without hands, very difficult for her, tends to fall backward, has knees collapse into valgus  10/03/23 Nustep level 4 x 5 minutes Gait outside around the back building one rest break and then up to the front door, with rollator and CGA Cone toe touches Side stepping airex balance beam in Pbars Tandem gait on airex balance bean in pbars Step turn reach PWR moves in sitting Walking HHA faster pace 200 feet Direction changes with HHA  09/20/23 Nustep level 4 x 5 minutes  PATIENT EDUCATION: Education details: POC Person educated: Patient and Child(ren) Education method: Explanation Education comprehension: verbalized understanding   HOME EXERCISE PROGRAM: Spoke with her and her son about some walking in the house with walker, seated kicks, marches and toe taps 10/03/23 added PWR moves to HEP  GOALS: Goals reviewed with patient? Yes  SHORT TERM GOALS: Target date: 10/24/23  Independent with initial HEP  Goal status: met 10/09/23   LONG TERM GOALS: Target date: 11/27/23  Independent with advanced HEP Goal status: ongoing 11/15/23  2.  Decrease TUG time to 20 seconds Goal status: ongoing 12/26/23  3.  Increase BERG balance test score to 42/56 Goal status: ongoing 11/15/23  4. Walk 360 feet in the 3 minute walk test Goal status: ongoing 12/26/23  5.  Get up from  sitting without using hands Goal status: ongoing 12/05/23  ASSESSMENT:  CLINICAL IMPRESSION: Patient is a 77 y.o. female who was seen today for physical therapy evaluation and treatment for Parkinson's and weakness.  she has really regressed some, we have not seen her in two weeks and she reports not doing anything at home, the weather has been bad, she struggled with standing from sitting, could not do it without hands.  I did walk with her outside, she was very impulsive and unsafe with negotiating curbs.  Needed a lot of verbal cues and min A to help with walker OBJECTIVE IMPAIRMENTS Abnormal gait, decreased activity tolerance, decreased balance, decreased coordination, decreased endurance, decreased mobility, difficulty walking, decreased ROM, decreased strength, increased muscle spasms, impaired flexibility, improper body mechanics, postural dysfunction, and pain.   REHAB POTENTIAL: Good  CLINICAL DECISION MAKING: Stable/uncomplicated  EVALUATION COMPLEXITY: Low  PLAN: PT FREQUENCY: 1x/week  PT DURATION: 9 weeks  PLANNED INTERVENTIONS: Therapeutic exercises, Therapeutic activity, Neuromuscular re-education, Balance training, Gait training, Patient/Family education, Self Care, Joint mobilization, Stair training, Electrical stimulation, Spinal mobilization, Cryotherapy, Moist heat, and Manual therapy  PLAN FOR NEXT SESSION: Will continue to work on her function and movement and safety  Melissa Tomaselli W, PT 12/26/2023, 1:53 PM

## 2024-01-01 ENCOUNTER — Ambulatory Visit: Payer: Medicare Other | Admitting: Physical Therapy

## 2024-01-02 ENCOUNTER — Ambulatory Visit: Payer: Medicare Other | Admitting: Physical Therapy

## 2024-01-06 ENCOUNTER — Other Ambulatory Visit: Payer: Self-pay | Admitting: Family Medicine

## 2024-01-06 DIAGNOSIS — E785 Hyperlipidemia, unspecified: Secondary | ICD-10-CM

## 2024-01-06 DIAGNOSIS — E538 Deficiency of other specified B group vitamins: Secondary | ICD-10-CM

## 2024-01-06 DIAGNOSIS — E559 Vitamin D deficiency, unspecified: Secondary | ICD-10-CM

## 2024-01-06 DIAGNOSIS — D649 Anemia, unspecified: Secondary | ICD-10-CM

## 2024-01-08 ENCOUNTER — Other Ambulatory Visit: Payer: Medicare Other

## 2024-01-09 ENCOUNTER — Encounter: Payer: Self-pay | Admitting: Physical Therapy

## 2024-01-09 ENCOUNTER — Ambulatory Visit: Payer: Medicare Other | Attending: Neurology | Admitting: Physical Therapy

## 2024-01-09 DIAGNOSIS — G20B1 Parkinson's disease with dyskinesia, without mention of fluctuations: Secondary | ICD-10-CM | POA: Insufficient documentation

## 2024-01-09 DIAGNOSIS — R262 Difficulty in walking, not elsewhere classified: Secondary | ICD-10-CM | POA: Insufficient documentation

## 2024-01-09 DIAGNOSIS — M6281 Muscle weakness (generalized): Secondary | ICD-10-CM | POA: Insufficient documentation

## 2024-01-09 DIAGNOSIS — R2681 Unsteadiness on feet: Secondary | ICD-10-CM | POA: Diagnosis not present

## 2024-01-09 NOTE — Therapy (Signed)
OUTPATIENT PHYSICAL THERAPY NEURO TREATMENT   Patient Name: [name] MRN: [id number] DOB:1946-05-23, 78 y.o., female Today's Date: 01/09/2024   PCP: Lydia Guiles PROVIDER: [name]   PT End of Session - 01/09/24 1617     Visit Number 8    Date for PT Re-Evaluation 02/13/24    Authorization Type [employer] Medicare 5 of 10    PT Start Time 1613    PT Stop Time 1700    PT Time Calculation (min) 47 min    Activity Tolerance Patient tolerated treatment well    Behavior During Therapy [facility] for tasks assessed/performed             Past Medical History:  Diagnosis Date   Advanced care planning/counseling discussion 05/26/2015   Advanced directive - scanned 07/2021 - wants all children involved in her medical decision, starting with son [name]. Doesn't want prolonged life support if terminal condition however ok with tube feeding.     Allergy    Anemia 09/25/2007   With mild leukopenia, on iron and b12 replacement.  Consider SPEP     Anxiety    Arthritis    Blood transfusion without reported diagnosis    CAD (coronary artery disease) 01/2010   MI, Nishan   Cataract    CHF (congestive heart failure) (HCC)    with episode of sepsis   Chronic cough 07/16/2017   Increased gabapentin to 100 tid from 100 qhs 09/04/2017  Plus max gerd rx   -  MBS 09/11/2017  Ok, rec barium swallow  - DgEs 09/21/2017 >>> Occasional secondary and tertiary contractions in the distal half of the esophagus, but primary peristaltic waves in the esophagus were essentially intact. No appreciable esophageal stricture or ulceration. In borderline slow but successful progress of the    Chronic insomnia 03/07/2016   Chronic, continuous use of opioids 12/07/2013   Closed fracture of rib of right side with routine healing 05/28/2021   Complication of anesthesia    Constipation 08/06/2018   DDD (degenerative disc disease), lumbar 05/31/2013   Lspine 2011 - mild DDD  S/p laminectomy and foraminotomy early 2021  Venetia Maxon)  S/p lumbar laminectomy 04/2022 (Dr Dawley)      Degenerative lumbar spinal stenosis 11/27/2019   Depression    not recently   Dysphagia 10/09/2017   Dysuria 09/02/2013   Encounter for general adult medical examination with abnormal findings 05/21/2014   Family circumstance 02/23/2015   Fibromyalgia    GAD (generalized anxiety disorder) 04/28/2016   GERD 02/22/2010   Glaucoma 02/2013   [facility]   Headache    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   History of colon polyps 2004   HLD (hyperlipidemia) 05/20/2013   Hypercalcemia 06/09/2023   Hypertension 05/20/2023   HYPOTENSION, ORTHOSTATIC 12/06/2008   Interstitial cystitis    [place] now Dr Logan Bores   Knee pain, bilateral 08/06/2018   Localized swelling of chest wall 12/23/2021   Lumbar radiculopathy, chronic 05/16/2022   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/08/2010   MDD (major depressive disorder), single episode, moderate (HCC) 11/12/2013   Medicare annual wellness visit, subsequent 05/20/2013   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  Prior yearly reclast, prolia started 03/2017  DEXA 10/2019 - T -2.8 hip, -0.1 spine  DEXA 09/2022 - T -3.1 L forearm, -2.6 L femur neck     Last prolia injection: 09/21/2022 - when priced out 03/2023, too expensive for patient. 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---responsive to cellcept  Has had eval at two tertiary centers  Rash is responsive to cellcept     Recurrent UTI 01/03/2011   UTI history:  12/2022 - pansensitive E coli treated with kefiex 7d course  01/2023 - pansensitive E coli treated with keflex 3d course   04/2023 - abnormal UA/micro at [facility] treated with keflex 5d course, UCx not sent     Right leg weakness 07/25/2023   Right lumbar radiculopathy 04/10/2018   MRI Lumbar spine 10/2018:   Multilevel DDD and facet degeneration with some resultant spinal and foraminal stenosis most prominent L5/S1 with R protrusion impinging on L5 nerve root, shallow central protrusion.   Saw [facility] Shon Baton) referred to Dr Ethelene Hal PMR 11/2018  LESI x3 with mild benefit 12/2018  S/p laminectomy/foraminotomy 2021 Venetia Maxon)  S/p lumbar laminectomy 04/2022 (Dr Dawley)      Syncope 01/03/2011   Zio patch 05/2023:  HR 58 - 171 bpm, average 78 bpm.  7 nonsustained SVT, longest 6 beats.  Rare supraventricular and ventricular ectopy.  No sustained arrhythmias.  No atrial fibrillation.     Takotsubo cardiomyopathy 2008   due to E coli urosepsis   Urinary incontinence 01/12/2023   Valvular heart disease 07/11/2023   Echocardiogram 05/21/2023 at [facility]  LVEF 55-60%, normal wall motion, impaired relaxation pattern consistent with age related change, mild MR, tr TR, mild aortic sclerosis without stenosis, mild-mod AR, fair quality      Visual changes 05/20/2023   Vitamin B12 deficiency 05/03/2007   IF negative (02/2017)     Vitamin D deficiency 05/09/2013   Past Surgical History:  Procedure Laterality Date   ABDOMINAL HYSTERECTOMY  1970s   IUD infection - first partial then with oophorectomy (cysts), complication - low blood pressure   CATARACT EXTRACTION Bilateral    CHOLECYSTECTOMY     complication - low blood pressure   COLONOSCOPY  06/2008   h/o polyps but latest WNL, rec rpt 10 yrs Juanda Chance)   COLONOSCOPY  11/2018   multiple TAs (10 polyps  total), rpt 2 yrs Russella Dar)   COLONOSCOPY  04/2021   multiple TAs, rpt 3 yrs Russella Dar)   CYSTOSCOPY  12/2013   abx treatment for recurrent cystitis   DEXA  04/2013   T -2.9 @ femur, -1.6 @ spine   DEXA  04/2017   T -2.9 hip, -0.7 spine   ESOPHAGOGASTRODUODENOSCOPY  12/2017   WNL, regardless esophagus dilated, small HH Russella Dar)   EYE SURGERY     LUMBAR LAMINECTOMY/DECOMPRESSION MICRODISCECTOMY Right 11/27/2019   Right Lumbar Four-Five foraminotomy;  Maeola Harman, MD)   LUMBAR LAMINECTOMY/DECOMPRESSION MICRODISCECTOMY Right 05/16/2022   Procedure: OPEN LUMBAR LAMINECTOMY, RT, L45 W/LATERAL RECESS DECOMPRESSION;  Surgeon: Bethann Goo, DO;  Location: [facility] OR;  Service: Neurosurgery;  Laterality: Right;  3C   MINOR PLACEMENT OF FIDUCIAL N/A 06/30/2021   Procedure: MINOR PLACEMENT OF  FIDUCIAL;  Surgeon: Maeola Harman, MD;  Location: [facility] OR;  Service: Neurosurgery;  Laterality: N/A;  Minor room   PTOSIS REPAIR Bilateral 10/2020   Plastic Surgery   PULSE GENERATOR IMPLANT Right 07/14/2021   Procedure: UNILATERAL PULSE GENERATOR IMPLANT;  Surgeon: Maeola Harman, MD;  Location: [facility] OR;  Service: Neurosurgery;  Laterality: Right;   SUBTHALAMIC STIMULATOR INSERTION Bilateral 07/07/2021   Procedure: Deep brain stimulator placement;  Surgeon: Maeola Harman, MD;  Location: [facility] OR;  Service: Neurosurgery;  Laterality: Bilateral;   TUBAL LIGATION     UPPER GASTROINTESTINAL ENDOSCOPY     Patient Active Problem List   Diagnosis Date Noted   Palpitations 07/27/2023   Moderate aortic regurgitation 07/27/2023   Paraparesis (HCC) 07/25/2023   Right leg weakness 07/25/2023   Allergy    Anxiety    Arthritis    Blood transfusion without reported diagnosis    Cataract    CHF (congestive heart failure) (HCC)    Complication of anesthesia    Depression    Headache    Interstitial cystitis    Pneumonia    PONV (postoperative nausea and vomiting)    Neck pain 07/11/2023   Valvular heart disease 07/11/2023    Hypercalcemia 06/09/2023   Hypertension 05/20/2023   Visual changes 05/20/2023   Urinary incontinence 01/12/2023   Lumbar radiculopathy, chronic 05/16/2022   Pre-op evaluation 04/26/2022   Localized swelling of chest wall 12/23/2021   Other fatigue 11/25/2021   Closed fracture of rib of right side with routine healing 05/28/2021   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   Degenerative lumbar spinal stenosis 11/27/2019   Knee pain, bilateral 08/06/2018   Constipation 08/06/2018   Right lumbar radiculopathy 04/10/2018   Dysphagia 10/09/2017   Chronic cough 07/16/2017   GAD (generalized anxiety disorder) 04/28/2016   Chronic insomnia 03/07/2016   Parkinson's disease (HCC) 08/25/2015   Advanced care planning/counseling discussion 05/26/2015   Family circumstance 02/23/2015   Encounter for general adult medical examination with abnormal findings 05/21/2014   Chronic, continuous use of opioids 12/07/2013   MDD (major depressive disorder), single episode, moderate (HCC) 11/12/2013   Dysuria 09/02/2013   DDD (degenerative disc disease), lumbar 05/31/2013   Medicare annual wellness visit, subsequent 05/20/2013   HLD (hyperlipidemia) 05/20/2013   Vitamin D deficiency 05/09/2013   Glaucoma 02/2013   Recurrent UTI 01/03/2011   Syncope 01/03/2011   Rash and other nonspecific skin eruption 05/11/2010   GERD 02/22/2010   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/08/2010   Osteoporosis 11/10/2009   Orthostatic hypotension 12/06/2008   Anemia 09/25/2007   Vitamin B12 deficiency 05/03/2007   Fibromyalgia 05/03/2007   Takotsubo cardiomyopathy 2008   History of colon polyps 2004    ONSET DATE: 06/28/2022  REFERRING DIAG: Parkinson's, S/P lumbar surgery  THERAPY DIAG:  Unsteadiness on feet  Parkinson's disease with dyskinesia, unspecified whether  manifestations fluctuate (HCC)  Muscle weakness (generalized)  Difficulty in walking, not elsewhere classified  Rationale for Evaluation and Treatment Rehabilitation  SUBJECTIVE:  SUBJECTIVE STATEMENT: Patient had a fall on Monday, reports that she was getting up to go to the bathroom, she reports that she had a fall, she reports that the walker turned over, she reports no injuries, she reports that she was able to get up. Pt accompanied by: son  PERTINENT HISTORY: see above, did have lumbar surgery in June, has a deep brain stimulator for movement disorder  PAIN:  Are you having pain? Yes: NPRS scale: 2/10 Pain location: lright rib area Pain description: ache/sore Aggravating factors: bending, lifting,doing too much pain can be 6/10 Relieving factors: rest can be 0/10  PrecAUTIONS: None  WEIGHT BEARING RESTRICTIONS No  FALLS: Has patient fallen in last 6 months? Yes. Number of falls 1  LIVING ENVIRONMENT: Lives with: lives with their family Lives in: House/apartment Stairs: Yes: Internal: 12 steps; can reach both Has following equipment at home: Single point cane and Walker - 4 wheeled  PLOF: Independent, does some housework  PATIENT GOALS:  be stronger, walk longer and better, have less episodes of "freezing"  OBJECTIVE:   COGNITION: Overall cognitive status: Within functional limits for tasks assessed   SENSATION: WFL   POSTURE: rounded shoulders, forward head, and decreased lumbar lordosis  LOWER EXTREMITY ROM:    Motions are WFL's but she has poor control of the right LE, weakness and dyskinesia  LOWER EXTREMITY MMT:    MMT Right Eval Left Eval  Hip flexion 4- 4-  Hip extension 4- 4-  Hip abduction 4- 4-  Hip adduction 4- 4-  Hip internal rotation    Hip external  rotation    Knee flexion 4- 4  Knee extension 4- 4  Ankle dorsiflexion 4- 4-  Ankle plantarflexion    Ankle inversion    Ankle eversion 4- 4-  (Blank rows = not tested)  TRANSFERS:  Has to use hands, if not falls back into chair, had to have multiple attempts at times due to falling back in chair  GAIT: Gait pattern: has extraneous motions of the right arm and the right leg and the shoulders and head at times when we walked with HHA, reports feels unsteady, she is shuffling her feet bilaterally today Distance walked: 200 feet Assistive device utilized: Walker - 4 wheeled and None Level of assistance: Complete Independence and CGA Comments: on stairs does one at a time  FUNCTIONAL TESTs:  5 times sit to stand: 56 seconds, has to use hands tends to fall backward multiple times during the attempts. Timed up and go (TUG): 60 seconds, with 4WW, uses hands to push up from sitting, has trouble initiating the first step BERG balance 12/56 3 minute walk test 200 feet with 4WW  TODAY'S TREATMENT:  01/09/24 Nustep level 5 x 5 minutes UBE level 3 x 5 minutes Tmill .8 mph x 2 minutes On airex reaching for numbers and colors on the wall Step turn reach 8" toe touches Sit to stand without hands needing verbal and tactile cues, standing reaching, trying to challenge her balance outside of her base of support Worked on safe transfers and really the set up as she seems to have the falls when standing and turning, instructed and practiced with her and demonstration to her son. HHA walking x 200 feet HHA direction changes  12/26/23 Nustep level 5 x 5 minutes   UBE level 3 x 4 minutes   Gait outside around the parking [place] with rollator and SBA, some CGA for curbs and bumps in the side walk as she  is a little dangerous with this. Standing in pbars reaching for numbers, then did this on airex Sit to stand practice without hands used weighted ball in hands, struggled with this today Leg extension  5# 2x10 20# leg curls 2x10 Leg press 20# 2x10 Educated on sit to stand at home in the lift shair halfway up   12/12/23 Nustep level 5 x 7 minutes Bike level 3 x 6 minutes Tmill 1.1 mph with a lot of cues for step length, heel strike UBE level 3 x 5 mintues Sidestepping on airex balance beam On airex reaching for numbers On airex eyes closed On airex head movements up and down and side to side Walking HHA working on step length, heel strike and speed Sit to stand trying with no hands Leg press 20# 2x10  12/05/23 Bike level 3 x 5 minutes Nustep level 5 x 5 minutes Turn step touch On airex touching numbers on wall called out Side step on and off airex [employer] Direction changes PWR moves in sitting Ball kicks  11/15/23 Gait with rollator with CGA/SBA Seated toe taps and heel raises Seated hip add/abduction LAQ Assessment of the right hip and knee see below  10/09/23 Nustep level 4 x 5 minutes Gait outside light HHA some cues for step length and speed, had a time when she was shuffling, did around the back building 2.5# LAQ, marches 2x10 Standing hip abduction 2.5# Cone toe touches with HHA and then without In Pbars fwd walking and backward walking big steps Side stepping in pbars Step turn reach in pbars Sit to stand trying without hands, very difficult for her, tends to fall backward, has knees collapse into valgus  10/03/23 Nustep level 4 x 5 minutes Gait outside around the back building one rest break and then up to the front door, with rollator and CGA Cone toe touches Side stepping airex balance beam in Pbars Tandem gait on airex balance bean in pbars Step turn reach PWR moves in sitting Walking HHA faster pace 200 feet Direction changes with HHA  09/20/23 Nustep level 4 x 5 minutes  PATIENT EDUCATION: Education details: POC Person educated: Patient and Child(ren) Education method: Explanation Education comprehension: verbalized understanding   HOME  EXERCISE PROGRAM: Spoke with her and her son about some walking in the house with walker, seated kicks, marches and toe taps 10/03/23 added PWR moves to HEP  GOALS: Goals reviewed with patient? Yes  SHORT TERM GOALS: Target date: 10/24/23  Independent with initial HEP  Goal status: met 10/09/23   LONG TERM GOALS: Target date: 11/27/23  Independent with advanced HEP Goal status: progressing 01/09/24  2.  Decrease TUG time to 20 seconds Goal status: ongoing 12/26/23  3.  Increase BERG balance test score to 42/56 Goal status: ongoing 11/15/23  4. Walk 360 feet in the 3 minute walk test Goal status: ongoing 12/26/23  5.  Get up from sitting without using hands Goal status: ongoing 01/09/24  ASSESSMENT:  CLINICAL IMPRESSION: Patient is a 78 y.o. female who was seen today for physical therapy evaluation and treatment for Parkinson's and weakness.  Patient has a lot more extraneous head and leg movements today, I asked about any medication changes and she denies.  Biggest issue that we worked on today is her safe transfers as she has had multiple falls with transfers, tends to put walker beside chair facing the wrong way, went over this with her and her son OBJECTIVE IMPAIRMENTS Abnormal gait, decreased activity tolerance, decreased balance,  decreased coordination, decreased endurance, decreased mobility, difficulty walking, decreased ROM, decreased strength, increased muscle spasms, impaired flexibility, improper body mechanics, postural dysfunction, and pain.   REHAB POTENTIAL: Good  CLINICAL DECISION MAKING: Stable/uncomplicated  EVALUATION COMPLEXITY: Low  PLAN: PT FREQUENCY: 1x/week  PT DURATION: 9 weeks  PLANNED INTERVENTIONS: Therapeutic exercises, Therapeutic activity, Neuromuscular re-education, Balance training, Gait training, Patient/Family education, Self Care, Joint mobilization, Stair training, Electrical stimulation, Spinal mobilization, Cryotherapy, Moist heat, and  Manual therapy  PLAN FOR NEXT SESSION: Will continue to work on her function and movement and safety  Angela Platner W, PT 01/09/2024, 4:18 PM

## 2024-01-10 ENCOUNTER — Other Ambulatory Visit (INDEPENDENT_AMBULATORY_CARE_PROVIDER_SITE_OTHER): Payer: Medicare Other

## 2024-01-10 DIAGNOSIS — D649 Anemia, unspecified: Secondary | ICD-10-CM | POA: Diagnosis not present

## 2024-01-10 DIAGNOSIS — E559 Vitamin D deficiency, unspecified: Secondary | ICD-10-CM

## 2024-01-10 DIAGNOSIS — E538 Deficiency of other specified B group vitamins: Secondary | ICD-10-CM

## 2024-01-10 DIAGNOSIS — E785 Hyperlipidemia, unspecified: Secondary | ICD-10-CM | POA: Diagnosis not present

## 2024-01-10 LAB — CBC WITH DIFFERENTIAL/PLATELET
Basophils Absolute: 0 10*3/uL (ref 0.0–0.1)
Basophils Relative: 0.8 % (ref 0.0–3.0)
Eosinophils Absolute: 0.1 10*3/uL (ref 0.0–0.7)
Eosinophils Relative: 2.2 % (ref 0.0–5.0)
HCT: 37 % (ref 36.0–46.0)
Hemoglobin: 12.1 g/dL (ref 12.0–15.0)
Lymphocytes Relative: 39.1 % (ref 12.0–46.0)
Lymphs Abs: 1.9 10*3/uL (ref 0.7–4.0)
MCHC: 32.7 g/dL (ref 30.0–36.0)
MCV: 87.5 fL (ref 78.0–100.0)
Monocytes Absolute: 0.3 10*3/uL (ref 0.1–1.0)
Monocytes Relative: 6.3 % (ref 3.0–12.0)
Neutro Abs: 2.5 10*3/uL (ref 1.4–7.7)
Neutrophils Relative %: 51.6 % (ref 43.0–77.0)
Platelets: 264 10*3/uL (ref 150.0–400.0)
RBC: 4.23 Mil/uL (ref 3.87–5.11)
RDW: 14.9 % (ref 11.5–15.5)
WBC: 4.9 10*3/uL (ref 4.0–10.5)

## 2024-01-10 LAB — LIPID PANEL
Cholesterol: 203 mg/dL — ABNORMAL HIGH (ref 0–200)
HDL: 55.9 mg/dL (ref >39.00)
LDL Cholesterol: 120 mg/dL — ABNORMAL HIGH (ref 0–99)
NonHDL: 146.85
Total CHOL/HDL Ratio: 4
Triglycerides: 134 mg/dL (ref 0.0–149.0)
VLDL: 26.8 mg/dL (ref 0.0–40.0)

## 2024-01-10 LAB — COMPREHENSIVE METABOLIC PANEL
ALT: 1 U/L (ref 0–35)
AST: 10 U/L (ref 0–37)
Albumin: 4.2 g/dL (ref 3.5–5.2)
Alkaline Phosphatase: 109 U/L (ref 39–117)
BUN: 18 mg/dL (ref 6–23)
CO2: 33 meq/L — ABNORMAL HIGH (ref 19–32)
Calcium: 9.3 mg/dL (ref 8.4–10.5)
Chloride: 101 meq/L (ref 96–112)
Creatinine, Ser: 0.7 mg/dL (ref 0.40–1.20)
GFR: 84.53 mL/min (ref >60.00)
Glucose, Bld: 89 mg/dL (ref 70–99)
Potassium: 3.9 meq/L (ref 3.5–5.1)
Sodium: 142 meq/L (ref 135–145)
Total Bilirubin: 0.5 mg/dL (ref 0.2–1.2)
Total Protein: 6.6 g/dL (ref 6.0–8.3)

## 2024-01-10 LAB — VITAMIN D 25 HYDROXY (VIT D DEFICIENCY, FRACTURES): VITD: 39.35 ng/mL (ref 30.00–100.00)

## 2024-01-10 LAB — VITAMIN B12: Vitamin B-12: 228 pg/mL (ref 211–911)

## 2024-01-10 LAB — TSH: TSH: 1.12 u[IU]/mL (ref 0.35–5.50)

## 2024-01-11 LAB — PARATHYROID HORMONE, INTACT (NO CA): PTH: 17 pg/mL (ref 16–77)

## 2024-01-15 ENCOUNTER — Encounter: Payer: Self-pay | Admitting: Family Medicine

## 2024-01-15 ENCOUNTER — Other Ambulatory Visit: Payer: Self-pay | Admitting: Family Medicine

## 2024-01-15 ENCOUNTER — Ambulatory Visit (INDEPENDENT_AMBULATORY_CARE_PROVIDER_SITE_OTHER): Payer: Medicare Other | Admitting: Family Medicine

## 2024-01-15 ENCOUNTER — Ambulatory Visit: Payer: Medicare Other | Admitting: Physical Therapy

## 2024-01-15 VITALS — BP 132/78 | HR 83 | Temp 98.1°F | Ht 64.5 in | Wt 156.1 lb

## 2024-01-15 DIAGNOSIS — K5909 Other constipation: Secondary | ICD-10-CM | POA: Diagnosis not present

## 2024-01-15 DIAGNOSIS — I38 Endocarditis, valve unspecified: Secondary | ICD-10-CM

## 2024-01-15 DIAGNOSIS — I951 Orthostatic hypotension: Secondary | ICD-10-CM

## 2024-01-15 DIAGNOSIS — E559 Vitamin D deficiency, unspecified: Secondary | ICD-10-CM

## 2024-01-15 DIAGNOSIS — M797 Fibromyalgia: Secondary | ICD-10-CM | POA: Diagnosis not present

## 2024-01-15 DIAGNOSIS — F321 Major depressive disorder, single episode, moderate: Secondary | ICD-10-CM

## 2024-01-15 DIAGNOSIS — F411 Generalized anxiety disorder: Secondary | ICD-10-CM | POA: Diagnosis not present

## 2024-01-15 DIAGNOSIS — E538 Deficiency of other specified B group vitamins: Secondary | ICD-10-CM

## 2024-01-15 DIAGNOSIS — G20A1 Parkinson's disease without dyskinesia, without mention of fluctuations: Secondary | ICD-10-CM

## 2024-01-15 DIAGNOSIS — Z0000 Encounter for general adult medical examination without abnormal findings: Secondary | ICD-10-CM

## 2024-01-15 DIAGNOSIS — G20B1 Parkinson's disease with dyskinesia, without mention of fluctuations: Secondary | ICD-10-CM

## 2024-01-15 DIAGNOSIS — E7800 Pure hypercholesterolemia, unspecified: Secondary | ICD-10-CM

## 2024-01-15 DIAGNOSIS — M810 Age-related osteoporosis without current pathological fracture: Secondary | ICD-10-CM | POA: Diagnosis not present

## 2024-01-15 DIAGNOSIS — Z1231 Encounter for screening mammogram for malignant neoplasm of breast: Secondary | ICD-10-CM

## 2024-01-15 DIAGNOSIS — Z23 Encounter for immunization: Secondary | ICD-10-CM | POA: Diagnosis not present

## 2024-01-15 DIAGNOSIS — Z0001 Encounter for general adult medical examination with abnormal findings: Secondary | ICD-10-CM | POA: Diagnosis not present

## 2024-01-15 DIAGNOSIS — Z7189 Other specified counseling: Secondary | ICD-10-CM

## 2024-01-15 DIAGNOSIS — N3946 Mixed incontinence: Secondary | ICD-10-CM

## 2024-01-15 MED ORDER — SERTRALINE HCL 100 MG PO TABS: 100.0000 mg | ORAL_TABLET | Freq: Every day | ORAL | 3 refills | Status: AC

## 2024-01-15 MED ORDER — CITRUCEL PO POWD
1.0000 | Freq: Every day | ORAL | Status: DC | PRN
Start: 2024-01-15 — End: 2024-02-13

## 2024-01-15 MED ORDER — MIRABEGRON ER 25 MG PO TB24: 25.0000 mg | ORAL_TABLET | Freq: Every day | ORAL | 6 refills | Status: DC

## 2024-01-15 NOTE — Assessment & Plan Note (Signed)
Preventative protocols reviewed and updated unless pt declined. Discussed healthy diet and lifestyle.  # provided to schedule mammo/dexa Discussed due for colonoscopy summer 2025

## 2024-01-15 NOTE — Patient Instructions (Addendum)
Flu shot today  Consider shingles shot and RSV shot through pharmacy.  Call to schedule mammogram at your convenience: [facility] [phone]. You'll be due for repeat bone density scan 09/2024.  Come summer 2025 you may call [facility] to schedule an appointment at [phone] to discuss repeat colonoscopy.  Try Citrucel fiber supplement with large glass of water or fluid for constipation, let me know if not tolerated.  May retry myrbetriq 25mg  for urine incontinence - sent to pharmacy  Good to see you today.  Return in 6 months for follow up visit

## 2024-01-15 NOTE — Assessment & Plan Note (Signed)

## 2024-01-15 NOTE — Assessment & Plan Note (Signed)
 Previously discussed.

## 2024-01-15 NOTE — Progress Notes (Unsigned)
Ph: [phone] Fax: [phone]   Patient ID: [name], female    DOB: 20-Jul-1946, 78 y.o.   MRN: [id number]  This visit was conducted in person.  BP 132/78   Pulse 83   Temp 98.1 F (36.7 C) (Oral)   Ht 5' 4.5" (1.638 m)   Wt 156 lb 2 oz (70.8 kg)   SpO2 97%   BMI 26.38 kg/m   BP Readings from Last 3 Encounters:  01/15/24 132/78  08/16/23 122/72  07/27/23 130/72    CC: AMW/CPE Subjective:   HPI: [name] is a 78 y.o. female presenting on 01/15/2024 for Medicare Wellness (Pt accompanied by son, Trey Paula. )   Did not see health advisor this year.  Hearing Screening   500Hz  1000Hz  2000Hz  4000Hz   Right ear 40 40 20 20  Left ear 40 40 20 25  Vision Screening - Comments:: Last eye exam, 12/2022. Next exam, 01/16/24.  Flowsheet Row Office Visit from 01/15/2024 in [facility]  PHQ-2 Total Score 0          01/15/2024   11:13 AM 07/11/2023   10:32 AM 06/08/2023   11:34 AM 03/16/2023    2:40 PM 01/10/2023   11:16 AM  Fall Risk   Falls in the past year? 1 1 1 1  0  Number falls in past yr: 1 0 0 0   Injury with Fall? 1 1 1  0   Follow up    Falls evaluation completed    Known parkinson disease followed by [facility] neurology ([name]) s/p DBS placement.  Continues fludrocortisone 0.1mg  daily for PD related orthostatic hypotension. Midodrine caused HA, northera unaffordable.  Continues klonopin 1/2 tab in am and 1 tab at night.  Continues sertraline 100mg  daily.    S/p lumbar laminectomy (Dr Dawley) 04/2022 - recovered well.   Has had hospitalizations this year for orthostatic syncope despite fludrocortisone. Saw neuro - not thought PD related. Saw cardiology for runs of SVT on Zio patch - not thought cardiac related but placed on 1 month monitor - overall ok. ?medication contribution (tramadol, klonopin). She is now off nortriptyline, and klonopin dose has been decreased. No further syncopal episodes.   Has had several falls in the past  few months.  Last saw [facility] 10/2023 after fall at home with resultant R hip wrist and hand pain but reassuringly no fracture.  Continues outpatient PT at [facility].  She is regularly using walker.   Preventative: COLONOSCOPY Date: 06/2008 h/o polyps but latest WNL, rec rpt 10 yrs Juanda Chance). COLONOSCOPY 11/2018 multiple TAs (10 polyps total), rpt 2 yrs Russella Dar)  Colonoscopy 04/2021 - multiple TAs, rpt 3 yrs Russella Dar)  Mammogram 09/2022 Birads1 @ [place] [facility] - # provided to call and schedule appt  Well woman - s/p hysterectomy and oophorectomy.  DEXA Date: 04/2013 T -2.9 at femur, -1.6 at spine. Last reclast 06/2014.  DEXA 6/2018T score -2.9 hip  DEXA 10/2019 - T -2.8 hip  DEXA 09/2022 - T -3.1 L forearm, -2.6 L femur neck. Q81mo prolia (09/21/2022) transitioned due to cost to yearly Reclast in 2024, last infusion 05/2023.  Lung cancer screen - not eligible  Flu yearly  COVID vaccine Pfizer 12/2019, 01/2020, no booster  Prevnar-13 04/2014, pneumovax 04/2015  Tdap 2012  Zostavax 2011  RSV - discussed  Shingrix - discussed  Advanced directive: scanned 07/2021 - wants all children involved in her medical decision making but son [name] to lead decision. Doesn't  want prolonged life support if terminal condition.  Seat belt use discussed.  Sunscreen use discussed. No changing moles on skin.  Non smoker  Alcohol - none Dentist - doesn't see as has dentures Eye exam q6 mo for glaucoma  Bowel - ongoing constipation in PD. Miralax caused abd pain. Linzess was unaffordable. Takes PRN dulcolax for BM. No h/o intestinal obstruction. Tried bran in apple sauce with limited benefit.  Bladder - + mixed urge and stress incontinence previously saw Dr Vernie Ammons urology. Myrbetriq was beneficial but unaffordable.   Lives with daughter, 1 dog. Widow of husband [name]) 2015. 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     Relevant past medical, surgical, family and social history reviewed and updated as indicated. Interim medical history since our last visit reviewed. Allergies and medications reviewed and updated. Outpatient Medications Prior to Visit  Medication Sig Dispense Refill   acetaminophen (TYLENOL) 500 MG tablet Take 2 tablets (1,000 mg total) by mouth in the morning, at noon, and at bedtime.     AMBULATORY NON FORMULARY MEDICATION Lift chair Dx:  G20 1 Device 0   Calcium Carbonate-Vitamin D (CALCIUM 600/VITAMIN D) 600-400 MG-UNIT chew tablet Chew 1 tablet by mouth daily.     carbidopa-levodopa (SINEMET CR) 50-200 MG tablet Take 1 tablet by mouth at bedtime. 90 tablet 1   carbidopa-levodopa (SINEMET IR) 25-100 MG tablet Take 1 tablet by mouth 3 (three) times daily. 9am/1pm/5pm (Patient taking differently: Take 1 tablet by mouth 3 (three) times daily. Takes 0.5 tablets at 9am/1pm/5pm) 270 tablet 1   Cholecalciferol (VITAMIN D3) 25 MCG (1000 UT) CAPS Take 1 capsule (1,000 Units total) by mouth daily. 30 capsule    clonazePAM (KLONOPIN) 0.5 MG tablet TAKE 1 TABLET BY MOUTH AT BEDTIME. 30 tablet 3   clotrimazole-betamethasone (LOTRISONE) cream Apply 1 application topically daily. 30 g 0   Cranberry-Vitamin C (AZO CRANBERRY URINARY TRACT) 250-60 MG CAPS Take 2 capsules by mouth daily.     cyanocobalamin (VITAMIN B12) 1000 MCG/ML injection INJECT 1 ML (1,000 MCG) INTRAMUSCULARLY EVERY 30 DAYS 3 mL 2   fludrocortisone (FLORINEF) 0.1 MG tablet Take 1 tablet (100 mcg total) by mouth daily. 30 tablet 6   Glycerin-Hypromellose-PEG 400 (ARTIFICIAL TEARS) 0.2-0.2-1 % SOLN Place 1 drop into both eyes daily as needed (dry eyes).     latanoprost (XALATAN) 0.005 % ophthalmic solution Place 1 drop into both eyes at bedtime.     nystatin cream (MYCOSTATIN) Apply 1 Application topically 2 (two) times daily. To affected  area/rash 30 g 0   SYRINGE-NEEDLE, DISP, 3 ML (BD SAFETYGLIDE SYRINGE/NEEDLE) 25G X 1" 3 ML MISC Use to inject vitamin B12 monthly. 50 each 0   traMADol (ULTRAM) 50 MG tablet TAKE 1 TABLET (50 MG TOTAL) BY MOUTH EVERY 6 (SIX) HOURS AS NEEDED FOR MODERATE PAIN 30 tablet 3   zoledronic acid (RECLAST) 5 MG/100ML SOLN injection Inject 100 mLs (5 mg total) into the vein. yearly     sertraline (ZOLOFT) 100 MG tablet Take 1 tablet (100 mg total) by mouth daily. 100 tablet 3   No facility-administered medications prior to visit.     Per HPI unless specifically indicated in ROS section below Review of Systems  Constitutional:  Negative for activity change, appetite change, chills, fatigue, fever and unexpected weight change.  HENT:  Negative for hearing loss.   Eyes:  Negative for visual disturbance.  Respiratory:  Negative for cough, chest tightness, shortness of breath and wheezing.   Cardiovascular:  Negative for chest pain, palpitations and leg swelling.  Gastrointestinal:  Positive for constipation. Negative for abdominal distention, abdominal pain, blood in stool, diarrhea, nausea and vomiting.  Genitourinary:  Negative for difficulty urinating and hematuria.  Musculoskeletal:  Negative for arthralgias, myalgias and neck pain.  Skin:  Negative for rash.  Neurological:  Positive for dizziness and headaches. Negative for seizures and syncope.  Hematological:  Negative for adenopathy. Does not bruise/bleed easily.  Psychiatric/Behavioral:  Negative for dysphoric mood. The patient is not nervous/anxious.     Objective:  BP 132/78   Pulse 83   Temp 98.1 F (36.7 C) (Oral)   Ht 5' 4.5" (1.638 m)   Wt 156 lb 2 oz (70.8 kg)   SpO2 97%   BMI 26.38 kg/m   Wt Readings from Last 3 Encounters:  01/15/24 156 lb 2 oz (70.8 kg)  08/16/23 156 lb 12.8 oz (71.1 kg)  07/27/23 155 lb (70.3 kg)      Physical Exam Vitals and nursing note reviewed.  Constitutional:      Appearance: Normal  appearance. She is not ill-appearing.  HENT:     Head: Normocephalic and atraumatic.     Right Ear: Tympanic membrane, ear canal and external ear normal. There is no impacted cerumen.     Left Ear: Tympanic membrane, ear canal and external ear normal. There is no impacted cerumen.     Mouth/Throat:     Mouth: Mucous membranes are moist.     Pharynx: Oropharynx is clear. No oropharyngeal exudate or posterior oropharyngeal erythema.  Eyes:     General:        Right eye: No discharge.        Left eye: No discharge.     Extraocular Movements: Extraocular movements intact.     Conjunctiva/sclera: Conjunctivae normal.     Pupils: Pupils are equal, round, and reactive to light.  Neck:     Thyroid: No thyroid mass or thyromegaly.  Cardiovascular:     Rate and Rhythm: Normal rate and regular rhythm.     Pulses: Normal pulses.     Heart sounds: Normal heart sounds. No murmur heard. Pulmonary:     Effort: Pulmonary effort is normal. No respiratory distress.     Breath sounds: Normal breath sounds. No wheezing, rhonchi or rales.  Abdominal:     General: Bowel sounds are normal. There is no distension.     Palpations: Abdomen is soft. There is no mass.     Tenderness: There is no abdominal tenderness. There is no guarding or rebound.     Hernia: No hernia is present.  Musculoskeletal:     Cervical back: Normal range of motion and neck supple. No rigidity.     Right lower leg: No edema.     Left lower leg: No edema.  Lymphadenopathy:     Cervical: No cervical adenopathy.  Skin:    General: Skin is warm and dry.     Findings: No rash.  Neurological:     General: No focal deficit present.     Mental Status: She is alert. Mental status is at baseline.     Comments:  Recall 3/3  Calculation 5/5 serial 3s  Psychiatric:        Mood and Affect: Mood normal.  Behavior: Behavior normal.       Results for orders placed or performed in visit on 01/10/24  Parathyroid hormone, intact  (no Ca)   Collection Time: 01/10/24  9:11 AM  Result Value Ref Range   PTH 17 16 - 77 pg/mL  VITAMIN D 25 Hydroxy (Vit-D Deficiency, Fractures)   Collection Time: 01/10/24  9:11 AM  Result Value Ref Range   VITD 39.35 30.00 - 100.00 ng/mL  Vitamin B12   Collection Time: 01/10/24  9:11 AM  Result Value Ref Range   Vitamin B-12 228 211 - 911 pg/mL  TSH   Collection Time: 01/10/24  9:11 AM  Result Value Ref Range   TSH 1.12 0.35 - 5.50 uIU/mL  CBC with Differential/Platelet   Collection Time: 01/10/24  9:11 AM  Result Value Ref Range   WBC 4.9 4.0 - 10.5 K/uL   RBC 4.23 3.87 - 5.11 Mil/uL   Hemoglobin 12.1 12.0 - 15.0 g/dL   HCT 16.1 09.6 - 04.5 %   MCV 87.5 78.0 - 100.0 fl   MCHC 32.7 30.0 - 36.0 g/dL   RDW 40.9 81.1 - 91.4 %   Platelets 264.0 150.0 - 400.0 K/uL   Neutrophils Relative % 51.6 43.0 - 77.0 %   Lymphocytes Relative 39.1 12.0 - 46.0 %   Monocytes Relative 6.3 3.0 - 12.0 %   Eosinophils Relative 2.2 0.0 - 5.0 %   Basophils Relative 0.8 0.0 - 3.0 %   Neutro Abs 2.5 1.4 - 7.7 K/uL   Lymphs Abs 1.9 0.7 - 4.0 K/uL   Monocytes Absolute 0.3 0.1 - 1.0 K/uL   Eosinophils Absolute 0.1 0.0 - 0.7 K/uL   Basophils Absolute 0.0 0.0 - 0.1 K/uL  Comprehensive metabolic panel   Collection Time: 01/10/24  9:11 AM  Result Value Ref Range   Sodium 142 135 - 145 mEq/L   Potassium 3.9 3.5 - 5.1 mEq/L   Chloride 101 96 - 112 mEq/L   CO2 33 (H) 19 - 32 mEq/L   Glucose, Bld 89 70 - 99 mg/dL   BUN 18 6 - 23 mg/dL   Creatinine, Ser 7.82 0.40 - 1.20 mg/dL   Total Bilirubin 0.5 0.2 - 1.2 mg/dL   Alkaline Phosphatase 109 39 - 117 U/L   AST 10 0 - 37 U/L   ALT 1 0 - 35 U/L   Total Protein 6.6 6.0 - 8.3 g/dL   Albumin 4.2 3.5 - 5.2 g/dL   GFR 95.62 >13.08 mL/min   Calcium 9.3 8.4 - 10.5 mg/dL  Lipid panel   Collection Time: 01/10/24  9:11 AM  Result Value Ref Range   Cholesterol 203 (H) 0 - 200 mg/dL   Triglycerides 657.8 0.0 - 149.0 mg/dL   HDL 46.96 >29.52 mg/dL   VLDL 84.1  0.0 - 32.4 mg/dL   LDL Cholesterol 401 (H) 0 - 99 mg/dL   Total CHOL/HDL Ratio 4    NonHDL 146.85    *Note: Due to a large number of results and/or encounters for the requested time period, some results have not been displayed. A complete set of results can be found in Results Review.    Assessment & Plan:   Problem List Items Addressed This Visit     Medicare annual wellness visit, subsequent - Primary (Chronic)   I have personally reviewed the Medicare Annual Wellness questionnaire and have noted 1. The patient's medical and social history 2. Their use of alcohol, tobacco or illicit drugs 3. Their current  medications and supplements 4. The patient's functional ability including ADL's, fall risks, home safety risks and hearing or visual impairment. Cognitive function has been assessed and addressed as indicated.  5. Diet and physical activity 6. Evidence for depression or mood disorders The patients weight, height, BMI have been recorded in the chart. I have made referrals, counseling and provided education to the patient based on review of the above and I have provided the pt with a written personalized care plan for preventive services. Provider list updated.. See scanned questionairre as needed for further documentation. Reviewed preventative protocols and updated unless pt declined.       Encounter for general adult medical examination with abnormal findings (Chronic)   Preventative protocols reviewed and updated unless pt declined. Discussed healthy diet and lifestyle.  # provided to schedule mammo/dexa Discussed due for colonoscopy summer 2025      Advanced care planning/counseling discussion (Chronic)   Previously discussed      Parkinson's disease (HCC) (Chronic)   Regularly sees neurology Dr [name]      Vitamin B12 deficiency   Continue monthly B12 shots at home.       Orthostatic hypotension   Continue fludrocortisone 0.1mg  daily.       Fibromyalgia    Relevant Medications   sertraline (ZOLOFT) 100 MG tablet   Osteoporosis   Last prolia shot 08/2022, transitioned to Reclast yearly infusion due to cost, latest 05/2023.      Vitamin D deficiency   Continue vit D 1000 units daily.       HLD (hyperlipidemia)   Chronic, not on  medication. Encouraged diet choices to improve cholesterol levels.  The 10-year ASCVD risk score (Arnett DK, et al., 2019) is: 17%   Values used to calculate the score:     Age: 76 years     Sex: Female     Is Non-Hispanic African American: No     Diabetic: No     Tobacco smoker: No     Systolic Blood Pressure: 132 mmHg     Is BP treated: No     HDL Cholesterol: 55.9 mg/dL     Total Cholesterol: 203 mg/dL       MDD (major depressive disorder), single episode, moderate (HCC)   Chronic, stable period on sertraline 100mg  daily with klonopin 0.5mg  at night.       Relevant Medications   sertraline (ZOLOFT) 100 MG tablet   GAD (generalized anxiety disorder)   Chronic, stable period on sertraline 100mg  daily with klonopin 0.5mg  at night.       Relevant Medications   sertraline (ZOLOFT) 100 MG tablet   Chronic constipation   Managing with prn dulcolax Miralax caused abd pain.  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  Has seen urology Myrbetriq beneficial but expensive - will price out again.       Relevant Medications   mirabegron ER (MYRBETRIQ) 25 MG TB24 tablet   Valvular heart disease   Other Visit Diagnoses       Need for influenza vaccination       Relevant Orders   Flu Vaccine QUAD High Dose(Fluad) (Completed)        Meds ordered this encounter  Medications   sertraline (ZOLOFT) 100 MG tablet    Sig:  Take 1 tablet (100 mg total) by mouth daily.    Dispense:  100 tablet    Refill:  3   methylcellulose (CITRUCEL) oral powder    Sig: Take 1 packet by mouth daily as needed (constipation).   mirabegron ER (MYRBETRIQ) 25 MG TB24 tablet    Sig: Take 1 tablet (25 mg total) by mouth daily.    Dispense:  30 tablet    Refill:  6    Orders Placed This Encounter  Procedures   Flu Vaccine QUAD High Dose(Fluad)    Patient Instructions  Flu shot today  Consider shingles shot and RSV shot through pharmacy.  Call to schedule mammogram at your convenience: [facility] [phone]. You'll be due for repeat bone density scan 09/2024.  Come summer 2025 you may call [facility] to schedule an appointment at [phone] to discuss repeat colonoscopy.  Try Citrucel fiber supplement with large glass of water or fluid for constipation, let me know if not tolerated.  May retry myrbetriq 25mg  for urine incontinence - sent to pharmacy  Good to see you today.  Return in 6 months for follow up visit   Follow up plan: Return in about 6 months (around 07/14/2024), or if symptoms worsen or fail to improve, for follow up visit.  Eustaquio Boyden, MD

## 2024-01-16 ENCOUNTER — Encounter: Payer: Self-pay | Admitting: Family Medicine

## 2024-01-16 NOTE — Assessment & Plan Note (Signed)
Mixed incontinence avoiding antimuscarinics.  Has seen urology Myrbetriq beneficial but expensive - will price out again.

## 2024-01-16 NOTE — Assessment & Plan Note (Signed)
Continue fludrocortisone 0.1mg daily.  

## 2024-01-16 NOTE — Assessment & Plan Note (Signed)
Stable period without increased level of care need

## 2024-01-16 NOTE — Assessment & Plan Note (Signed)
Chronic, stable period on sertraline 100mg  daily with klonopin 0.5mg  at night.

## 2024-01-16 NOTE — Assessment & Plan Note (Signed)
Chronic, not on  medication. Encouraged diet choices to improve cholesterol levels.  The 10-year ASCVD risk score (Arnett DK, et al., 2019) is: 17%   Values used to calculate the score:     Age: 78 years     Sex: Female     Is Non-Hispanic African American: No     Diabetic: No     Tobacco smoker: No     Systolic Blood Pressure: 132 mmHg     Is BP treated: No     HDL Cholesterol: 55.9 mg/dL     Total Cholesterol: 203 mg/dL

## 2024-01-16 NOTE — Assessment & Plan Note (Signed)
Regularly sees neurology Dr Arbutus Leas

## 2024-01-16 NOTE — Assessment & Plan Note (Signed)
 Continue vit D 1000 units daily.

## 2024-01-16 NOTE — Assessment & Plan Note (Signed)
Managing with prn dulcolax Miralax caused abd pain.  Discussed fiber supplement - start Citrucel

## 2024-01-16 NOTE — Assessment & Plan Note (Addendum)
Last prolia shot 08/2022, transitioned to Reclast yearly infusion due to cost, latest 05/2023.

## 2024-01-16 NOTE — Assessment & Plan Note (Signed)
Continue monthly B12 shots at home.  

## 2024-01-23 ENCOUNTER — Ambulatory Visit: Payer: Medicare Other | Admitting: Physical Therapy

## 2024-01-30 ENCOUNTER — Ambulatory Visit: Payer: Medicare Other | Attending: Neurology | Admitting: Physical Therapy

## 2024-01-30 ENCOUNTER — Encounter: Payer: Self-pay | Admitting: Physical Therapy

## 2024-01-30 DIAGNOSIS — R262 Difficulty in walking, not elsewhere classified: Secondary | ICD-10-CM | POA: Diagnosis not present

## 2024-01-30 DIAGNOSIS — M6281 Muscle weakness (generalized): Secondary | ICD-10-CM | POA: Diagnosis not present

## 2024-01-30 DIAGNOSIS — M5459 Other low back pain: Secondary | ICD-10-CM | POA: Diagnosis not present

## 2024-01-30 DIAGNOSIS — G20B1 Parkinson's disease with dyskinesia, without mention of fluctuations: Secondary | ICD-10-CM | POA: Insufficient documentation

## 2024-01-30 DIAGNOSIS — R2681 Unsteadiness on feet: Secondary | ICD-10-CM | POA: Insufficient documentation

## 2024-01-30 NOTE — Therapy (Signed)
 OUTPATIENT PHYSICAL THERAPY NEURO TREATMENT   Patient Name: [name] MRN: [id number] DOB:[birth year], 78 y.o., female Today's Date: 01/30/2024   PCP: Lydia Guiles PROVIDER: [name]   PT End of Session - 01/30/24 1401     Visit Number 9    Date for PT Re-Evaluation 02/13/24    Authorization Type [employer] Medicare 6 of 10    PT Start Time 1358    PT Stop Time 1443    PT Time Calculation (min) 45 min    Activity Tolerance Patient tolerated treatment well    Behavior During Therapy WFL for tasks assessed/performed             Past Medical History:  Diagnosis Date   Advanced care planning/counseling discussion 05/26/2015   Advanced directive - scanned 07/2021 - wants all children involved in her medical decision, starting with son [name]. Doesn't want prolonged life support if terminal condition however ok with tube feeding.     Allergy    Anemia 09/25/2007   With mild leukopenia, on iron and b12 replacement.  Consider SPEP     Anxiety    Arthritis    Blood transfusion without reported diagnosis    CAD (coronary artery disease) 01/2010   MI, Nishan   Cataract    CHF (congestive heart failure) (HCC)    with episode of sepsis   Chronic cough 07/16/2017   Increased gabapentin to 100 tid from 100 qhs 09/04/2017  Plus max gerd rx   -  MBS 09/11/2017  Ok, rec barium swallow  - DgEs 09/21/2017 >>> Occasional secondary and tertiary contractions in the distal half of the esophagus, but primary peristaltic waves in the esophagus were essentially intact. No appreciable esophageal stricture or ulceration. In borderline slow but successful progress of the    Chronic insomnia 03/07/2016   Chronic, continuous use of opioids 12/07/2013   Closed fracture of rib of right side with routine healing 05/28/2021   Complication of anesthesia    Constipation 08/06/2018   DDD (degenerative disc disease), lumbar 05/31/2013   Lspine 2011 - mild DDD  S/p laminectomy and foraminotomy early 2021  Venetia Maxon)  S/p lumbar laminectomy 04/2022 (Dr Dawley)      Degenerative lumbar spinal stenosis 11/27/2019   Depression    not recently   Dysphagia 10/09/2017   Dysuria 09/02/2013   Encounter for general adult medical examination with abnormal findings 05/21/2014   Family circumstance 02/23/2015   Fibromyalgia    GAD (generalized anxiety disorder) 04/28/2016   GERD 02/22/2010   Glaucoma 02/2013   Headache    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   History of colon polyps 2004   HLD (hyperlipidemia) 05/20/2013   Hypercalcemia 06/09/2023   Hypertension 05/20/2023   HYPOTENSION, ORTHOSTATIC 12/06/2008   Interstitial cystitis    [place] now Dr Logan Bores   Knee pain, bilateral 08/06/2018   Localized swelling of chest wall 12/23/2021   Lumbar radiculopathy, chronic 05/16/2022   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/08/2010   MDD (major depressive disorder), single episode, moderate (HCC) 11/12/2013   Medicare annual wellness visit, subsequent 05/20/2013   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   Neck pain 07/11/2023   Osteoporosis 11/10/2009   Other fatigue 11/25/2021   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   Dr Dimas Millin plastic surgery     Paraparesis (HCC) 07/25/2023   Parkinson's disease (HCC) 08/25/2015   Dx Dr [name] 07/2015    Pneumonia    PONV (postoperative nausea  and vomiting)    Pre-op evaluation 04/26/2022   Rash and other nonspecific skin eruption 05/11/2010   Annotation: thought to be autoimmune---responsive to cellcept  Has had eval at two tertiary centers  Rash is responsive to cellcept     Recurrent UTI 01/03/2011   UTI history:  12/2022 - pansensitive E coli treated with kefiex 7d course  01/2023 - pansensitive E coli treated with keflex 3d course   04/2023 - abnormal UA/micro at [facility] treated with keflex 5d course, UCx not sent     Right leg weakness 07/25/2023   Right lumbar radiculopathy 04/10/2018    MRI Lumbar spine 10/2018:   Multilevel DDD and facet degeneration with some resultant spinal and foraminal stenosis most prominent L5/S1 with R protrusion impinging on L5 nerve root, shallow central protrusion.   Saw [facility] Shon Baton) referred to Dr Ethelene Hal PMR 11/2018  LESI x3 with mild benefit 12/2018  S/p laminectomy/foraminotomy 2021 Venetia Maxon)  S/p lumbar laminectomy 04/2022 (Dr Dawley)      Syncope 01/03/2011   Zio patch 05/2023:  HR 58 - 171 bpm, average 78 bpm.  7 nonsustained SVT, longest 6 beats.  Rare supraventricular and ventricular ectopy.  No sustained arrhythmias.  No atrial fibrillation.     Takotsubo cardiomyopathy 2008   due to E coli urosepsis   Urinary incontinence 01/12/2023   Valvular heart disease 07/11/2023   Echocardiogram 05/21/2023 at [facility]  LVEF 55-60%, normal wall motion, impaired relaxation pattern consistent with age related change, mild MR, tr TR, mild aortic sclerosis without stenosis, mild-mod AR, fair quality      Visual changes 05/20/2023   Vitamin B12 deficiency 05/03/2007   IF negative (02/2017)     Vitamin D deficiency 05/09/2013   Past Surgical History:  Procedure Laterality Date   ABDOMINAL HYSTERECTOMY  1970s   IUD infection - first partial then with oophorectomy (cysts), complication - low blood pressure   CATARACT EXTRACTION Bilateral    CHOLECYSTECTOMY     complication - low blood pressure   COLONOSCOPY  06/2008   h/o polyps but latest WNL, rec rpt 10 yrs Juanda Chance)   COLONOSCOPY  11/2018   multiple TAs (10 polyps total), rpt 2 yrs Russella Dar)   COLONOSCOPY  04/2021   multiple TAs, rpt 3 yrs Russella Dar)   CYSTOSCOPY  12/2013   abx treatment for recurrent cystitis   DEXA  04/2013   T -2.9 @ femur, -1.6 @ spine   DEXA  04/2017   T -2.9 hip, -0.7 spine   ESOPHAGOGASTRODUODENOSCOPY  12/2017   WNL, regardless esophagus dilated, small HH Russella Dar)   EYE SURGERY     LUMBAR LAMINECTOMY/DECOMPRESSION MICRODISCECTOMY Right 11/27/2019   Right Lumbar Four-Five  foraminotomy;  Maeola Harman, MD)   LUMBAR LAMINECTOMY/DECOMPRESSION MICRODISCECTOMY Right 05/16/2022   Procedure: OPEN LUMBAR LAMINECTOMY, RT, L45 W/LATERAL RECESS DECOMPRESSION;  Surgeon: Bethann Goo, DO;  Location: [facility] OR;  Service: Neurosurgery;  Laterality: Right;  3C   MINOR PLACEMENT OF FIDUCIAL N/A 06/30/2021   Procedure: MINOR PLACEMENT OF FIDUCIAL;  Surgeon: Maeola Harman, MD;  Location[facility] OR;  Service: Neurosurgery;  Laterality: N/A;  Minor room   PTOSIS REPAIR Bilateral 10/2020   Plastic Surgery   PULSE GENERATOR IMPLANT Right 07/14/2021   Procedure: UNILATERAL PULSE GENERATOR IMPLANT;  Surgeon: Maeola Harman, MD;  Location[facility] OR;  Service: Neurosurgery;  Laterality: Right;   SUBTHALAMIC STIMULATOR INSERTION Bilateral 07/07/2021   Procedure: Deep brain stimulator placement;  Surgeon: Maeola Harman, MD;  Location: [facility] OR;  Service: Neurosurgery;  Laterality: Bilateral;   TUBAL LIGATION     UPPER GASTROINTESTINAL ENDOSCOPY     Patient Active Problem List   Diagnosis Date Noted   Palpitations 07/27/2023   Moderate aortic regurgitation 07/27/2023   Right leg weakness 07/25/2023   Cataract    CHF (congestive heart failure) (HCC)    Complication of anesthesia    Interstitial cystitis    PONV (postoperative nausea and vomiting)    Valvular heart disease 07/11/2023   Visual changes 05/20/2023   Mixed stress and urge urinary incontinence 01/12/2023   Lumbar radiculopathy, chronic 05/16/2022   Pre-op evaluation 04/26/2022   Localized swelling of chest wall 12/23/2021   Other fatigue 11/25/2021   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   Degenerative lumbar spinal stenosis 11/27/2019   Knee pain, bilateral 08/06/2018   Chronic constipation 08/06/2018   Right lumbar radiculopathy 04/10/2018   Dysphagia 10/09/2017   Chronic cough 07/16/2017    GAD (generalized anxiety disorder) 04/28/2016   Chronic insomnia 03/07/2016   Parkinson's disease (HCC) 08/25/2015   Advanced care planning/counseling discussion 05/26/2015   Encounter for general adult medical examination with abnormal findings 05/21/2014   Chronic, continuous use of opioids 12/07/2013   MDD (major depressive disorder), single episode, moderate (HCC) 11/12/2013   DDD (degenerative disc disease), lumbar 05/31/2013   Medicare annual wellness visit, subsequent 05/20/2013   HLD (hyperlipidemia) 05/20/2013   Vitamin D deficiency 05/09/2013   Glaucoma 02/2013   Recurrent UTI 01/03/2011   Syncope 01/03/2011   Rash and other nonspecific skin eruption 05/11/2010   GERD 02/22/2010   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/08/2010   Osteoporosis 11/10/2009   Orthostatic hypotension 12/06/2008   Vitamin B12 deficiency 05/03/2007   Fibromyalgia 05/03/2007   Takotsubo cardiomyopathy 2008   History of colon polyps 2004    ONSET DATE: 06/28/2022  REFERRING DIAG: Parkinson's, S/P lumbar surgery  THERAPY DIAG:  Unsteadiness on feet  Parkinson's disease with dyskinesia, unspecified whether manifestations fluctuate (HCC)  Muscle weakness (generalized)  Difficulty in walking, not elsewhere classified  Other low back pain  Rationale for Evaluation and Treatment Rehabilitation  SUBJECTIVE:                                                                                                                                                                                              SUBJECTIVE STATEMENT: Patient reports that she cancelled last week, she had a feeling of weakness and like she was going to pass out.  She  had seen the MD about this prior, she sees the MD in two weeks, she reports a near fall on Sunday with getting gout of bed Pt accompanied by: son  PERTINENT HISTORY: see above, did have lumbar surgery in June, has a deep brain stimulator for movement  disorder  PAIN:  Are you having pain? Yes: NPRS scale: 2/10 Pain location: lright rib area Pain description: ache/sore Aggravating factors: bending, lifting,doing too much pain can be 6/10 Relieving factors: rest can be 0/10  PrecAUTIONS: None  WEIGHT BEARING RESTRICTIONS No  FALLS: Has patient fallen in last 6 months? Yes. Number of falls 1  LIVING ENVIRONMENT: Lives with: lives with their family Lives in: House/apartment Stairs: Yes: Internal: 12 steps; can reach both Has following equipment at home: Single point cane and Walker - 4 wheeled  PLOF: Independent, does some housework  PATIENT GOALS:  be stronger, walk longer and better, have less episodes of "freezing"  OBJECTIVE:   COGNITION: Overall cognitive status: Within functional limits for tasks assessed   SENSATION: WFL   POSTURE: rounded shoulders, forward head, and decreased lumbar lordosis  LOWER EXTREMITY ROM:    Motions are WFL's but she has poor control of the right LE, weakness and dyskinesia  LOWER EXTREMITY MMT:    MMT Right Eval Left Eval  Hip flexion 4- 4-  Hip extension 4- 4-  Hip abduction 4- 4-  Hip adduction 4- 4-  Hip internal rotation    Hip external rotation    Knee flexion 4- 4  Knee extension 4- 4  Ankle dorsiflexion 4- 4-  Ankle plantarflexion    Ankle inversion    Ankle eversion 4- 4-  (Blank rows = not tested)  TRANSFERS:  Has to use hands, if not falls back into chair, had to have multiple attempts at times due to falling back in chair  GAIT: Gait pattern: has extraneous motions of the right arm and the right leg and the shoulders and head at times when we walked with HHA, reports feels unsteady, she is shuffling her feet bilaterally today Distance walked: 200 feet Assistive device utilized: Walker - 4 wheeled and None Level of assistance: Complete Independence and CGA Comments: on stairs does one at a time  FUNCTIONAL TESTs:  5 times sit to stand: 56 seconds, has to  use hands tends to fall backward multiple times during the attempts. Timed up and go (TUG): 60 seconds, with 4WW, uses hands to push up from sitting, has trouble initiating the first step, 01/30/24 TUG 21 seconds BERG balance 12/56 3 minute walk test 200 feet with 4WW  TODAY'S TREATMENT:  01/30/24 Nustep level 5 x 6 minutes Gait outside with rollator and belt with SBA around the back building, good pace and no rest around to the front of the building On iarex ball toss On airex reaching [employer]one figure eights with the [facility] and SBA On rocker board 2 ways balance TUG 21 seconds Bike level 4 x 5 minutes  01/09/24 Nustep level 5 x 5 minutes UBE level 3 x 5 minutes Tmill .8 mph x 2 minutes On airex reaching for numbers and colors on the wall Step turn reach 8" toe touches Sit to stand without hands needing verbal and tactile cues, standing reaching, trying to challenge her balance outside of her base of support Worked on safe transfers and really the set up as she seems to have the falls when standing and turning, instructed and practiced with her and demonstration to her son. HHA  walking x 200 feet HHA direction changes  12/26/23 Nustep level 5 x 5 minutes   UBE level 3 x 4 minutes   Gait outside around the parking [place] with rollator and SBA, some CGA for curbs and bumps in the side walk as she is a little dangerous with this. Standing in pbars reaching for numbers, then did this on airex Sit to stand practice without hands used weighted ball in hands, struggled with this today Leg extension 5# 2x10 20# leg curls 2x10 Leg press 20# 2x10 Educated on sit to stand at home in the lift shair halfway up   12/12/23 Nustep level 5 x 7 minutes Bike level 3 x 6 minutes Tmill 1.1 mph with a lot of cues for step length, heel strike UBE level 3 x 5 mintues Sidestepping on airex balance beam On airex reaching for numbers On airex eyes closed On airex head movements up and down and side to  side Walking HHA working on step length, heel strike and speed Sit to stand trying with no hands Leg press 20# 2x10  12/05/23 Bike level 3 x 5 minutes Nustep level 5 x 5 minutes Turn step touch On airex touching numbers on wall called out Side step on and off airex [employer] Direction changes PWR moves in sitting [employer] kicks  PATIENT EDUCATION: Education details: POC Person educated: Patient and Child(ren) Education method: Explanation Education comprehension: verbalized understanding   HOME EXERCISE PROGRAM: Spoke with her and her son about some walking in the house with walker, seated kicks, marches and toe taps 10/03/23 added PWR moves to HEP  GOALS: Goals reviewed with patient? Yes  SHORT TERM GOALS: Target date: 10/24/23  Independent with initial HEP  Goal status: met 10/09/23   LONG TERM GOALS: Target date: 11/27/23  Independent with advanced HEP Goal status: progressing 01/09/24  2.  Decrease TUG time to 20 seconds Goal status: progressing 01/30/24  3.  Increase BERG balance test score to 42/56 Goal status: ongoing  01/30/24  4. Walk 360 feet in the 3 minute walk test Goal status: ongoing 12/26/23  5.  Get up from sitting without using hands Goal status: still struggles but can do at times 01/30/24  ASSESSMENT:  CLINICAL IMPRESSION: Patient is a 78 y.o. female who was seen today for physical therapy evaluation and treatment for Parkinson's and weakness.  Patient did very well today, had a TUG of 21 seconds, at eval was 60 seconds.  She can get up from sitting at times without arms but will tend to sit back at times  and needs a few attempts at times   OBJECTIVE IMPAIRMENTS Abnormal gait, decreased activity tolerance, decreased balance, decreased coordination, decreased endurance, decreased mobility, difficulty walking, decreased ROM, decreased strength, increased muscle spasms, impaired flexibility, improper body mechanics, postural dysfunction, and pain.   REHAB  POTENTIAL: Good  CLINICAL DECISION MAKING: Stable/uncomplicated  EVALUATION COMPLEXITY: Low  PLAN: PT FREQUENCY: 1x/week  PT DURATION: 9 weeks  PLANNED INTERVENTIONS: Therapeutic exercises, Therapeutic activity, Neuromuscular re-education, Balance training, Gait training, Patient/Family education, Self Care, Joint mobilization, Stair training, Electrical stimulation, Spinal mobilization, Cryotherapy, Moist heat, and Manual therapy  PLAN FOR NEXT SESSION: Will continue to work on her function and movement and safety  Sadeen Wiegel W, PT 01/30/2024, 2:02 PM

## 2024-01-31 DIAGNOSIS — H401131 Primary open-angle glaucoma, bilateral, mild stage: Secondary | ICD-10-CM | POA: Diagnosis not present

## 2024-01-31 DIAGNOSIS — H43393 Other vitreous opacities, bilateral: Secondary | ICD-10-CM | POA: Diagnosis not present

## 2024-01-31 DIAGNOSIS — H53143 Visual discomfort, bilateral: Secondary | ICD-10-CM | POA: Diagnosis not present

## 2024-02-04 ENCOUNTER — Telehealth: Payer: Self-pay

## 2024-02-04 NOTE — Telephone Encounter (Signed)
 Copied from CRM [id number]. Topic: Referral - Question >> Feb 04, 2024 11:11 AM Lennart Pall wrote: Reason for CRM: Patient needing to get a referral for a bone density test

## 2024-02-04 NOTE — Telephone Encounter (Signed)
 Patient has order for mammogram. She will not be due for dexa until after November of 2025. Left message to cal office so we can let her know.

## 2024-02-05 ENCOUNTER — Ambulatory Visit: Payer: Medicare Other

## 2024-02-05 NOTE — Addendum Note (Signed)
 Addended by: Donnamarie Poag on: 02/05/2024 12:28 PM   Modules accepted: Orders

## 2024-02-05 NOTE — Telephone Encounter (Signed)
 Lvm asking pt to call back. Need to relay JoEllen's message.

## 2024-02-06 ENCOUNTER — Ambulatory Visit: Payer: Medicare Other | Admitting: Physical Therapy

## 2024-02-06 ENCOUNTER — Encounter: Payer: Self-pay | Admitting: Physical Therapy

## 2024-02-06 DIAGNOSIS — R262 Difficulty in walking, not elsewhere classified: Secondary | ICD-10-CM | POA: Diagnosis not present

## 2024-02-06 DIAGNOSIS — M6281 Muscle weakness (generalized): Secondary | ICD-10-CM | POA: Diagnosis not present

## 2024-02-06 DIAGNOSIS — M5459 Other low back pain: Secondary | ICD-10-CM | POA: Diagnosis not present

## 2024-02-06 DIAGNOSIS — R2681 Unsteadiness on feet: Secondary | ICD-10-CM | POA: Diagnosis not present

## 2024-02-06 DIAGNOSIS — G20B1 Parkinson's disease with dyskinesia, without mention of fluctuations: Secondary | ICD-10-CM | POA: Diagnosis not present

## 2024-02-06 NOTE — Telephone Encounter (Signed)
 Left message to return call to our office.  This is call #3. Will send my chart as well for patient to call office back if any questions.

## 2024-02-06 NOTE — Therapy (Signed)
 OUTPATIENT PHYSICAL THERAPY NEURO TREATMENT Progress Note Reporting Period 09/19/24 to 02/06/24  See note below for Objective Data and Assessment of Progress/Goals.      Patient Name: [name] MRN: [id number] DOB:Sep 17, 1946, 78 y.o., female Today's Date: 02/06/2024   PCP: Lydia Guiles PROVIDER: [name]   PT End of Session - 02/06/24 1355     Visit Number 10    Date for PT Re-Evaluation 02/13/24    Authorization Type [employer] Medicare 7 of 10    PT Start Time 1356    PT Stop Time 1441    PT Time Calculation (min) 45 min    Activity Tolerance Patient tolerated treatment well    Behavior During Therapy [facility] for tasks assessed/performed             Past Medical History:  Diagnosis Date   Advanced care planning/counseling discussion 05/26/2015   Advanced directive - scanned 07/2021 - wants all children involved in her medical decision, starting with son [name]. Doesn't want prolonged life support if terminal condition however ok with tube feeding.     Allergy    Anemia 09/25/2007   With mild leukopenia, on iron and b12 replacement.  Consider SPEP     Anxiety    Arthritis    Blood transfusion without reported diagnosis    CAD (coronary artery disease) 01/2010   MI, Nishan   Cataract    CHF (congestive heart failure) (HCC)    with episode of sepsis   Chronic cough 07/16/2017   Increased gabapentin to 100 tid from 100 qhs 09/04/2017  Plus max gerd rx   -  MBS 09/11/2017  Ok, rec barium swallow  - DgEs 09/21/2017 >>> Occasional secondary and tertiary contractions in the distal half of the esophagus, but primary peristaltic waves in the esophagus were essentially intact. No appreciable esophageal stricture or ulceration. In borderline slow but successful progress of the    Chronic insomnia 03/07/2016   Chronic, continuous use of opioids 12/07/2013   Closed fracture of rib of right side with routine healing 05/28/2021   Complication of anesthesia    Constipation  08/06/2018   DDD (degenerative disc disease), lumbar 05/31/2013   Lspine 2011 - mild DDD  S/p laminectomy and foraminotomy early 2021 Venetia Maxon)  S/p lumbar laminectomy 04/2022 (Dr Dawley)      Degenerative lumbar spinal stenosis 11/27/2019   Depression    not recently   Dysphagia 10/09/2017   Dysuria 09/02/2013   Encounter for general adult medical examination with abnormal findings 05/21/2014   Family circumstance 02/23/2015   Fibromyalgia    GAD (generalized anxiety disorder) 04/28/2016   GERD 02/22/2010   Glaucoma 02/2013   Headache    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   History of colon polyps 2004   HLD (hyperlipidemia) 05/20/2013   Hypercalcemia 06/09/2023   Hypertension 05/20/2023   HYPOTENSION, ORTHOSTATIC 12/06/2008   Interstitial cystitis    [place] now Dr Logan Bores   Knee pain, bilateral 08/06/2018   Localized swelling of chest wall 12/23/2021   Lumbar radiculopathy, chronic 05/16/2022   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/08/2010   MDD (major depressive disorder), single episode, moderate (HCC) 11/12/2013   Medicare annual wellness visit, subsequent 05/20/2013   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   Neck pain 07/11/2023   Osteoporosis 11/10/2009   Other fatigue 11/25/2021   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   Dr Dimas Millin plastic surgery     Paraparesis (HCC) 07/25/2023  Parkinson's disease (HCC) 08/25/2015   Dx Dr [name] 07/2015    Pneumonia    PONV (postoperative nausea and vomiting)    Pre-op evaluation 04/26/2022   Rash and other nonspecific skin eruption 05/11/2010   Annotation: thought to be autoimmune---responsive to cellcept  Has had eval at two tertiary centers  Rash is responsive to cellcept     Recurrent UTI 01/03/2011   UTI history:  12/2022 - pansensitive E coli treated with kefiex 7d course  01/2023 - pansensitive E coli treated with keflex 3d course   04/2023 - abnormal UA/micro at  [facility] treated with keflex 5d course, UCx not sent     Right leg weakness 07/25/2023   Right lumbar radiculopathy 04/10/2018   MRI Lumbar spine 10/2018:   Multilevel DDD and facet degeneration with some resultant spinal and foraminal stenosis most prominent L5/S1 with R protrusion impinging on L5 nerve root, shallow central protrusion.   Saw [facility] Shon Baton) referred to Dr Ethelene Hal PMR 11/2018  LESI x3 with mild benefit 12/2018  S/p laminectomy/foraminotomy 2021 Venetia Maxon)  S/p lumbar laminectomy 04/2022 (Dr Dawley)      Syncope 01/03/2011   Zio patch 05/2023:  HR 58 - 171 bpm, average 78 bpm.  7 nonsustained SVT, longest 6 beats.  Rare supraventricular and ventricular ectopy.  No sustained arrhythmias.  No atrial fibrillation.     Takotsubo cardiomyopathy 2008   due to E coli urosepsis   Urinary incontinence 01/12/2023   Valvular heart disease 07/11/2023   Echocardiogram 05/21/2023 at [facility]  LVEF 55-60%, normal wall motion, impaired relaxation pattern consistent with age related change, mild MR, tr TR, mild aortic sclerosis without stenosis, mild-mod AR, fair quality      Visual changes 05/20/2023   Vitamin B12 deficiency 05/03/2007   IF negative (02/2017)     Vitamin D deficiency 05/09/2013   Past Surgical History:  Procedure Laterality Date   ABDOMINAL HYSTERECTOMY  1970s   IUD infection - first partial then with oophorectomy (cysts), complication - low blood pressure   CATARACT EXTRACTION Bilateral    CHOLECYSTECTOMY     complication - low blood pressure   COLONOSCOPY  06/2008   h/o polyps but latest WNL, rec rpt 10 yrs Juanda Chance)   COLONOSCOPY  11/2018   multiple TAs (10 polyps total), rpt 2 yrs Russella Dar)   COLONOSCOPY  04/2021   multiple TAs, rpt 3 yrs Russella Dar)   CYSTOSCOPY  12/2013   abx treatment for recurrent cystitis   DEXA  04/2013   T -2.9 @ femur, -1.6 @ spine   DEXA  04/2017   T -2.9 hip, -0.7 spine   ESOPHAGOGASTRODUODENOSCOPY  12/2017   WNL, regardless esophagus  dilated, small HH Russella Dar)   EYE SURGERY     LUMBAR LAMINECTOMY/DECOMPRESSION MICRODISCECTOMY Right 11/27/2019   Right Lumbar Four-Five foraminotomy;  Maeola Harman, MD)   LUMBAR LAMINECTOMY/DECOMPRESSION MICRODISCECTOMY Right 05/16/2022   Procedure: OPEN LUMBAR LAMINECTOMY, RT, L45 W/LATERAL RECESS DECOMPRESSION;  Surgeon: Bethann Goo, DO;  Location: [facility] OR;  Service: Neurosurgery;  Laterality: Right;  3C   MINOR PLACEMENT OF FIDUCIAL N/A 06/30/2021   Procedure: MINOR PLACEMENT OF FIDUCIAL;  Surgeon: Maeola Harman, MD;  Location: [facility] OR;  Service: Neurosurgery;  Laterality: N/A;  Minor room   PTOSIS REPAIR Bilateral 10/2020   Plastic Surgery   PULSE GENERATOR IMPLANT Right 07/14/2021   Procedure: UNILATERAL PULSE GENERATOR IMPLANT;  Surgeon: Maeola Harman, MD;  Location: [facility] OR;  Service: Neurosurgery;  Laterality: Right;   SUBTHALAMIC STIMULATOR  INSERTION Bilateral 07/07/2021   Procedure: Deep brain stimulator placement;  Surgeon: Maeola Harman, MD;  Location: [facility] OR;  Service: Neurosurgery;  Laterality: Bilateral;   TUBAL LIGATION     UPPER GASTROINTESTINAL ENDOSCOPY     Patient Active Problem List   Diagnosis Date Noted   Palpitations 07/27/2023   Moderate aortic regurgitation 07/27/2023   Right leg weakness 07/25/2023   Cataract    CHF (congestive heart failure) (HCC)    Complication of anesthesia    Interstitial cystitis    PONV (postoperative nausea and vomiting)    Valvular heart disease 07/11/2023   Visual changes 05/20/2023   Mixed stress and urge urinary incontinence 01/12/2023   Lumbar radiculopathy, chronic 05/16/2022   Pre-op evaluation 04/26/2022   Localized swelling of chest wall 12/23/2021   Other fatigue 11/25/2021   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   Degenerative lumbar spinal stenosis 11/27/2019   Knee pain, bilateral  08/06/2018   Chronic constipation 08/06/2018   Right lumbar radiculopathy 04/10/2018   Dysphagia 10/09/2017   Chronic cough 07/16/2017   GAD (generalized anxiety disorder) 04/28/2016   Chronic insomnia 03/07/2016   Parkinson's disease (HCC) 08/25/2015   Advanced care planning/counseling discussion 05/26/2015   Encounter for general adult medical examination with abnormal findings 05/21/2014   Chronic, continuous use of opioids 12/07/2013   MDD (major depressive disorder), single episode, moderate (HCC) 11/12/2013   DDD (degenerative disc disease), lumbar 05/31/2013   Medicare annual wellness visit, subsequent 05/20/2013   HLD (hyperlipidemia) 05/20/2013   Vitamin D deficiency 05/09/2013   Glaucoma 02/2013   Recurrent UTI 01/03/2011   Syncope 01/03/2011   Rash and other nonspecific skin eruption 05/11/2010   GERD 02/22/2010   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/08/2010   Osteoporosis 11/10/2009   Orthostatic hypotension 12/06/2008   Vitamin B12 deficiency 05/03/2007   Fibromyalgia 05/03/2007   Takotsubo cardiomyopathy 2008   History of colon polyps 2004    ONSET DATE: 06/28/2022  REFERRING DIAG: Parkinson's, S/P lumbar surgery  THERAPY DIAG:  Unsteadiness on feet  Parkinson's disease with dyskinesia, unspecified whether manifestations fluctuate (HCC)  Muscle weakness (generalized)  Difficulty in walking, not elsewhere classified  Other low back pain  Rationale for Evaluation and Treatment Rehabilitation  SUBJECTIVE:                                                                                                                                                                                              SUBJECTIVE STATEMENT: Patient reports no  recent falls,  Does report some low back pain Pt accompanied by: son  PERTINENT HISTORY: see above, did have lumbar surgery in June, has a deep brain stimulator for movement disorder  PAIN:  Are you having pain?  Yes: NPRS scale: 2/10 Pain location: lright rib area Pain description: ache/sore Aggravating factors: bending, lifting,doing too much pain can be 6/10 Relieving factors: rest can be 0/10  PrecAUTIONS: None  WEIGHT BEARING RESTRICTIONS No  FALLS: Has patient fallen in last 6 months? Yes. Number of falls 1  LIVING ENVIRONMENT: Lives with: lives with their family Lives in: House/apartment Stairs: Yes: Internal: 12 steps; can reach both Has following equipment at home: Single point cane and Walker - 4 wheeled  PLOF: Independent, does some housework  PATIENT GOALS:  be stronger, walk longer and better, have less episodes of "freezing"  OBJECTIVE:   COGNITION: Overall cognitive status: Within functional limits for tasks assessed   SENSATION: WFL   POSTURE: rounded shoulders, forward head, and decreased lumbar lordosis  LOWER EXTREMITY ROM:    Motions are WFL's but she has poor control of the right LE, weakness and dyskinesia  LOWER EXTREMITY MMT:    MMT Right Eval Left Eval  Hip flexion 4- 4-  Hip extension 4- 4-  Hip abduction 4- 4-  Hip adduction 4- 4-  Hip internal rotation    Hip external rotation    Knee flexion 4- 4  Knee extension 4- 4  Ankle dorsiflexion 4- 4-  Ankle plantarflexion    Ankle inversion    Ankle eversion 4- 4-  (Blank rows = not tested)  TRANSFERS:  Has to use hands, if not falls back into chair, had to have multiple attempts at times due to falling back in chair  GAIT: Gait pattern: has extraneous motions of the right arm and the right leg and the shoulders and head at times when we walked with HHA, reports feels unsteady, she is shuffling her feet bilaterally today Distance walked: 200 feet Assistive device utilized: Walker - 4 wheeled and None Level of assistance: Complete Independence and CGA Comments: on stairs does one at a time  FUNCTIONAL TESTs:  5 times sit to stand: 56 seconds, has to use hands tends to fall backward multiple  times during the attempts. Timed up and go (TUG): 60 seconds, with 4WW, uses hands to push up from sitting, has trouble initiating the first step, 01/30/24 TUG 21 seconds BERG balance 12/56 3 minute walk test 200 feet with 4WW,   TODAY'S TREATMENT:  02/06/24 Nustep level 5 x 6 minutes Gait outside with rollator and gait belt around the back building 3 minute walk test 375' with rollator On airex ball toss STS On airex reaching Step turn reach On rockerboard balance   01/30/24 Nustep level 5 x 6 minutes Gait outside with rollator and belt with SBA around the back building, good pace and no rest around to the front of the building On iarex ball toss On airex reaching Cone figure eights with the [facility] and SBA On rocker board 2 ways balance TUG 21 seconds Bike level 4 x 5 minutes  01/09/24 Nustep level 5 x 5 minutes UBE level 3 x 5 minutes Tmill .8 mph x 2 minutes On airex reaching for numbers and colors on the wall Step turn reach 8" toe touches Sit to stand without hands needing verbal and tactile cues, standing reaching, trying to challenge her balance outside of her base of support Worked on safe transfers and really  the set up as she seems to have the falls when standing and turning, instructed and practiced with her and demonstration to her son. HHA walking x 200 feet HHA direction changes  12/26/23 Nustep level 5 x 5 minutes   UBE level 3 x 4 minutes   Gait outside around the parking [place] with rollator and SBA, some CGA for curbs and bumps in the side walk as she is a little dangerous with this. Standing in pbars reaching for numbers, then did this on airex Sit to stand practice without hands used weighted ball in hands, struggled with this today Leg extension 5# 2x10 20# leg curls 2x10 Leg press 20# 2x10 Educated on sit to stand at home in the lift shair halfway up   12/12/23 Nustep level 5 x 7 minutes Bike level 3 x 6 minutes Tmill 1.1 mph with a lot of cues for  step length, heel strike UBE level 3 x 5 mintues Sidestepping on airex balance beam On airex reaching for numbers On airex eyes closed On airex head movements up and down and side to side Walking HHA working on step length, heel strike and speed Sit to stand trying with no hands Leg press 20# 2x10  12/05/23 Bike level 3 x 5 minutes Nustep level 5 x 5 minutes Turn step touch On airex touching numbers on wall called out Side step on and off airex [employer] Direction changes PWR moves in sitting [employer] kicks  PATIENT EDUCATION: Education details: POC Person educated: Patient and Child(ren) Education method: Explanation Education comprehension: verbalized understanding   HOME EXERCISE PROGRAM: Spoke with her and her son about some walking in the house with walker, seated kicks, marches and toe taps 10/03/23 added PWR moves to HEP  GOALS: Goals reviewed with patient? Yes  SHORT TERM GOALS: Target date: 10/24/23  Independent with initial HEP  Goal status: met 10/09/23   LONG TERM GOALS: Target date: 11/27/23  Independent with advanced HEP Goal status: progressing 02/06/24  2.  Decrease TUG time to 20 seconds Goal status: progressing 01/30/24  3.  Increase BERG balance test score to 42/56 Goal status: ongoing  01/30/24  4. Walk 360 feet in the 3 minute walk test Goal status: met 02/06/24  5.  Get up from sitting without using hands Goal status: still struggles but can do at times 01/30/24  ASSESSMENT:  CLINICAL IMPRESSION: Patient is a 78 y.o. female who was seen today for physical therapy evaluation and treatment for Parkinson's and weakness.  Patient did very well today, had a TUG of 21 seconds, at eval was 60 seconds.  Increased her ability in the 3 minute walk test from 200 feet to now 375 feet.   OBJECTIVE IMPAIRMENTS Abnormal gait, decreased activity tolerance, decreased balance, decreased coordination, decreased endurance, decreased mobility, difficulty walking,  decreased ROM, decreased strength, increased muscle spasms, impaired flexibility, improper body mechanics, postural dysfunction, and pain.   REHAB POTENTIAL: Good  CLINICAL DECISION MAKING: Stable/uncomplicated  EVALUATION COMPLEXITY: Low  PLAN: PT FREQUENCY: 1x/week  PT DURATION: 9 weeks  PLANNED INTERVENTIONS: Therapeutic exercises, Therapeutic activity, Neuromuscular re-education, Balance training, Gait training, Patient/Family education, Self Care, Joint mobilization, Stair training, Electrical stimulation, Spinal mobilization, Cryotherapy, Moist heat, and Manual therapy  PLAN FOR NEXT SESSION: will assess next week and then she will see the MD, we will decide on further treatment at that time  Jearld Lesch, PT 02/06/2024, 1:56 PM

## 2024-02-11 ENCOUNTER — Encounter: Payer: Self-pay | Admitting: Physical Therapy

## 2024-02-11 ENCOUNTER — Other Ambulatory Visit: Payer: Self-pay | Admitting: Neurology

## 2024-02-11 ENCOUNTER — Ambulatory Visit: Payer: Medicare Other | Admitting: Physical Therapy

## 2024-02-11 DIAGNOSIS — M5459 Other low back pain: Secondary | ICD-10-CM

## 2024-02-11 DIAGNOSIS — M6281 Muscle weakness (generalized): Secondary | ICD-10-CM

## 2024-02-11 DIAGNOSIS — R2681 Unsteadiness on feet: Secondary | ICD-10-CM | POA: Diagnosis not present

## 2024-02-11 DIAGNOSIS — R262 Difficulty in walking, not elsewhere classified: Secondary | ICD-10-CM

## 2024-02-11 DIAGNOSIS — G20B1 Parkinson's disease with dyskinesia, without mention of fluctuations: Secondary | ICD-10-CM | POA: Diagnosis not present

## 2024-02-11 NOTE — Therapy (Signed)
 OUTPATIENT PHYSICAL THERAPY NEURO TREATMENT  Patient Name: [name] MRN: [id number] DOB:[birth year], 78 y.o., female Today's Date: 02/11/2024   PCP: Lydia Guiles PROVIDER: [name]   PT End of Session - 02/11/24 1354     Visit Number 11    Date for PT Re-Evaluation 02/13/24    Authorization Type [employer] Medicare 8 of 10    PT Start Time 1352    PT Stop Time 1440    PT Time Calculation (min) 48 min    Activity Tolerance Patient tolerated treatment well    Behavior During Therapy [facility] for tasks assessed/performed             Past Medical History:  Diagnosis Date   Advanced care planning/counseling discussion 05/26/2015   Advanced directive - scanned 07/2021 - wants all children involved in her medical decision, starting with son [name]. Doesn't want prolonged life support if terminal condition however ok with tube feeding.     Allergy    Anemia 09/25/2007   With mild leukopenia, on iron and b12 replacement.  Consider SPEP     Anxiety    Arthritis    Blood transfusion without reported diagnosis    CAD (coronary artery disease) 01/2010   MI, Nishan   Cataract    CHF (congestive heart failure) (HCC)    with episode of sepsis   Chronic cough 07/16/2017   Increased gabapentin to 100 tid from 100 qhs 09/04/2017  Plus max gerd rx   -  MBS 09/11/2017  Ok, rec barium swallow  - DgEs 09/21/2017 >>> Occasional secondary and tertiary contractions in the distal half of the esophagus, but primary peristaltic waves in the esophagus were essentially intact. No appreciable esophageal stricture or ulceration. In borderline slow but successful progress of the    Chronic insomnia 03/07/2016   Chronic, continuous use of opioids 12/07/2013   Closed fracture of rib of right side with routine healing 05/28/2021   Complication of anesthesia    Constipation 08/06/2018   DDD (degenerative disc disease), lumbar 05/31/2013   Lspine 2011 - mild DDD  S/p laminectomy and foraminotomy early 2021  Venetia Maxon)  S/p lumbar laminectomy 04/2022 (Dr Dawley)      Degenerative lumbar spinal stenosis 11/27/2019   Depression    not recently   Dysphagia 10/09/2017   Dysuria 09/02/2013   Encounter for general adult medical examination with abnormal findings 05/21/2014   Family circumstance 02/23/2015   Fibromyalgia    GAD (generalized anxiety disorder) 04/28/2016   GERD 02/22/2010   Glaucoma 02/2013   Headache    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   History of colon polyps 2004   HLD (hyperlipidemia) 05/20/2013   Hypercalcemia 06/09/2023   Hypertension 05/20/2023   HYPOTENSION, ORTHOSTATIC 12/06/2008   Interstitial cystitis    [place] now Dr Logan Bores   Knee pain, bilateral 08/06/2018   Localized swelling of chest wall 12/23/2021   Lumbar radiculopathy, chronic 05/16/2022   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/08/2010   MDD (major depressive disorder), single episode, moderate (HCC) 11/12/2013   Medicare annual wellness visit, subsequent 05/20/2013   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   Neck pain 07/11/2023   Osteoporosis 11/10/2009   Other fatigue 11/25/2021   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   Dr Dimas Millin plastic surgery     Paraparesis (HCC) 07/25/2023   Parkinson's disease (HCC) 08/25/2015   Dx Dr [name] 07/2015    Pneumonia    PONV (postoperative nausea and  vomiting)    Pre-op evaluation 04/26/2022   Rash and other nonspecific skin eruption 05/11/2010   Annotation: thought to be autoimmune---responsive to cellcept  Has had eval at two tertiary centers  Rash is responsive to cellcept     Recurrent UTI 01/03/2011   UTI history:  12/2022 - pansensitive E coli treated with kefiex 7d course  01/2023 - pansensitive E coli treated with keflex 3d course   04/2023 - abnormal UA/micro at [facility] treated with keflex 5d course, UCx not sent     Right leg weakness 07/25/2023   Right lumbar radiculopathy 04/10/2018    MRI Lumbar spine 10/2018:   Multilevel DDD and facet degeneration with some resultant spinal and foraminal stenosis most prominent L5/S1 with R protrusion impinging on L5 nerve root, shallow central protrusion.   Saw [facility] Shon Baton) referred to Dr Ethelene Hal PMR 11/2018  LESI x3 with mild benefit 12/2018  S/p laminectomy/foraminotomy 2021 Venetia Maxon)  S/p lumbar laminectomy 04/2022 (Dr Dawley)      Syncope 01/03/2011   Zio patch 05/2023:  HR 58 - 171 bpm, average 78 bpm.  7 nonsustained SVT, longest 6 beats.  Rare supraventricular and ventricular ectopy.  No sustained arrhythmias.  No atrial fibrillation.     Takotsubo cardiomyopathy 2008   due to E coli urosepsis   Urinary incontinence 01/12/2023   Valvular heart disease 07/11/2023   Echocardiogram 05/21/2023 at [facility]  LVEF 55-60%, normal wall motion, impaired relaxation pattern consistent with age related change, mild MR, tr TR, mild aortic sclerosis without stenosis, mild-mod AR, fair quality      Visual changes 05/20/2023   Vitamin B12 deficiency 05/03/2007   IF negative (02/2017)     Vitamin D deficiency 05/09/2013   Past Surgical History:  Procedure Laterality Date   ABDOMINAL HYSTERECTOMY  1970s   IUD infection - first partial then with oophorectomy (cysts), complication - low blood pressure   CATARACT EXTRACTION Bilateral    CHOLECYSTECTOMY     complication - low blood pressure   COLONOSCOPY  06/2008   h/o polyps but latest WNL, rec rpt 10 yrs Juanda Chance)   COLONOSCOPY  11/2018   multiple TAs (10 polyps total), rpt 2 yrs Russella Dar)   COLONOSCOPY  04/2021   multiple TAs, rpt 3 yrs Russella Dar)   CYSTOSCOPY  12/2013   abx treatment for recurrent cystitis   DEXA  04/2013   T -2.9 @ femur, -1.6 @ spine   DEXA  04/2017   T -2.9 hip, -0.7 spine   ESOPHAGOGASTRODUODENOSCOPY  12/2017   WNL, regardless esophagus dilated, small HH Russella Dar)   EYE SURGERY     LUMBAR LAMINECTOMY/DECOMPRESSION MICRODISCECTOMY Right 11/27/2019   Right Lumbar Four-Five  foraminotomy;  Maeola Harman, MD)   LUMBAR LAMINECTOMY/DECOMPRESSION MICRODISCECTOMY Right 05/16/2022   Procedure: OPEN LUMBAR LAMINECTOMY, RT, L45 W/LATERAL RECESS DECOMPRESSION;  Surgeon: Bethann Goo, DO;  Location: [facility] OR;  Service: Neurosurgery;  Laterality: Right;  3C   MINOR PLACEMENT OF FIDUCIAL N/A 06/30/2021   Procedure: MINOR PLACEMENT OF FIDUCIAL;  Surgeon: Maeola Harman, MD;  Location: [facility] OR;  Service: Neurosurgery;  Laterality: N/A;  Minor room   PTOSIS REPAIR Bilateral 10/2020   Plastic Surgery   PULSE GENERATOR IMPLANT Right 07/14/2021   Procedure: UNILATERAL PULSE GENERATOR IMPLANT;  Surgeon: Maeola Harman, MD;  Location: [facility] OR;  Service: Neurosurgery;  Laterality: Right;   SUBTHALAMIC STIMULATOR INSERTION Bilateral 07/07/2021   Procedure: Deep brain stimulator placement;  Surgeon: Maeola Harman, MD;  Location: [facility] OR;  Service:  Neurosurgery;  Laterality: Bilateral;   TUBAL LIGATION     UPPER GASTROINTESTINAL ENDOSCOPY     Patient Active Problem List   Diagnosis Date Noted   Palpitations 07/27/2023   Moderate aortic regurgitation 07/27/2023   Right leg weakness 07/25/2023   Cataract    CHF (congestive heart failure) (HCC)    Complication of anesthesia    Interstitial cystitis    PONV (postoperative nausea and vomiting)    Valvular heart disease 07/11/2023   Visual changes 05/20/2023   Mixed stress and urge urinary incontinence 01/12/2023   Lumbar radiculopathy, chronic 05/16/2022   Pre-op evaluation 04/26/2022   Localized swelling of chest wall 12/23/2021   Other fatigue 11/25/2021   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   Degenerative lumbar spinal stenosis 11/27/2019   Knee pain, bilateral 08/06/2018   Chronic constipation 08/06/2018   Right lumbar radiculopathy 04/10/2018   Dysphagia 10/09/2017   Chronic cough 07/16/2017    GAD (generalized anxiety disorder) 04/28/2016   Chronic insomnia 03/07/2016   Parkinson's disease (HCC) 08/25/2015   Advanced care planning/counseling discussion 05/26/2015   Encounter for general adult medical examination with abnormal findings 05/21/2014   Chronic, continuous use of opioids 12/07/2013   MDD (major depressive disorder), single episode, moderate (HCC) 11/12/2013   DDD (degenerative disc disease), lumbar 05/31/2013   Medicare annual wellness visit, subsequent 05/20/2013   HLD (hyperlipidemia) 05/20/2013   Vitamin D deficiency 05/09/2013   Glaucoma 02/2013   Recurrent UTI 01/03/2011   Syncope 01/03/2011   Rash and other nonspecific skin eruption 05/11/2010   GERD 02/22/2010   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/08/2010   Osteoporosis 11/10/2009   Orthostatic hypotension 12/06/2008   Vitamin B12 deficiency 05/03/2007   Fibromyalgia 05/03/2007   Takotsubo cardiomyopathy 2008   History of colon polyps 2004    ONSET DATE: 06/28/2022  REFERRING DIAG: Parkinson's, S/P lumbar surgery  THERAPY DIAG:  Unsteadiness on feet  Parkinson's disease with dyskinesia, unspecified whether manifestations fluctuate (HCC)  Muscle weakness (generalized)  Difficulty in walking, not elsewhere classified  Other low back pain  Rationale for Evaluation and Treatment Rehabilitation  SUBJECTIVE:                                                                                                                                                                                              SUBJECTIVE STATEMENT: No falls, walked some Pt accompanied by: son  PERTINENT HISTORY: see above, did have lumbar surgery in June, has a deep brain stimulator  for movement disorder  PAIN:  Are you having pain? Yes: NPRS scale: 2/10 Pain location: lright rib area Pain description: ache/sore Aggravating factors: bending, lifting,doing too much pain can be 6/10 Relieving factors: rest can be  0/10  PrecAUTIONS: None  WEIGHT BEARING RESTRICTIONS No  FALLS: Has patient fallen in last 6 months? Yes. Number of falls 1  LIVING ENVIRONMENT: Lives with: lives with their family Lives in: House/apartment Stairs: Yes: Internal: 12 steps; can reach both Has following equipment at home: Single point cane and Walker - 4 wheeled  PLOF: Independent, does some housework  PATIENT GOALS:  be stronger, walk longer and better, have less episodes of "freezing"  OBJECTIVE:   COGNITION: Overall cognitive status: Within functional limits for tasks assessed   SENSATION: WFL   POSTURE: rounded shoulders, forward head, and decreased lumbar lordosis  LOWER EXTREMITY ROM:    Motions are WFL's but she has poor control of the right LE, weakness and dyskinesia  LOWER EXTREMITY MMT:    MMT Right Eval Left Eval  Hip flexion 4- 4-  Hip extension 4- 4-  Hip abduction 4- 4-  Hip adduction 4- 4-  Hip internal rotation    Hip external rotation    Knee flexion 4- 4  Knee extension 4- 4  Ankle dorsiflexion 4- 4-  Ankle plantarflexion    Ankle inversion    Ankle eversion 4- 4-  (Blank rows = not tested)  TRANSFERS:  Has to use hands, if not falls back into chair, had to have multiple attempts at times due to falling back in chair  GAIT: Gait pattern: has extraneous motions of the right arm and the right leg and the shoulders and head at times when we walked with HHA, reports feels unsteady, she is shuffling her feet bilaterally today Distance walked: 200 feet Assistive device utilized: Walker - 4 wheeled and None Level of assistance: Complete Independence and CGA Comments: on stairs does one at a time  FUNCTIONAL TESTs:  5 times sit to stand: 56 seconds, has to use hands tends to fall backward multiple times during the attempts. Timed up and go (TUG): 60 seconds, with 4WW, uses hands to push up from sitting, has trouble initiating the first step, 01/30/24 TUG 21 seconds BERG balance  12/56 3 minute walk test 200 feet with 4WW,   TODAY'S TREATMENT:  02/11/24 UBE level 3 x 4 minutes Nustep level 5 x 6 minutes Gait outside with SBA using rollator and Min A to negotiate curbs with rollator On airex reaching On airex ball toss Stepping over objects front and side stepping Fast walking big steps with light HHA Direction changes STS cues for nose over toes On airex cone toe touch 5XSTS = 28 seconds still using hands the last 2 reps TUG 21 seconds   02/06/24 Nustep level 5 x 6 minutes Gait outside with rollator and gait belt around the back building 3 minute walk test 375' with rollator On airex ball toss STS On airex reaching Step turn reach On rockerboard balance   01/30/24 Nustep level 5 x 6 minutes Gait outside with rollator and belt with SBA around the back building, good pace and no rest around to the front of the building On iarex ball toss On airex reaching Cone figure eights with the [facility] and SBA On rocker board 2 ways balance TUG 21 seconds Bike level 4 x 5 minutes  01/09/24 Nustep level 5 x 5 minutes UBE level 3 x 5 minutes Tmill .8 mph  x 2 minutes On airex reaching for numbers and colors on the wall Step turn reach 8" toe touches Sit to stand without hands needing verbal and tactile cues, standing reaching, trying to challenge her balance outside of her base of support Worked on safe transfers and really the set up as she seems to have the falls when standing and turning, instructed and practiced with her and demonstration to her son. HHA walking x 200 feet HHA direction changes  12/26/23 Nustep level 5 x 5 minutes   UBE level 3 x 4 minutes   Gait outside around the parking [place] with rollator and SBA, some CGA for curbs and bumps in the side walk as she is a little dangerous with this. Standing in pbars reaching for numbers, then did this on airex Sit to stand practice without hands used weighted ball in hands, struggled with this  today Leg extension 5# 2x10 20# leg curls 2x10 Leg press 20# 2x10 Educated on sit to stand at home in the lift shair halfway up   12/12/23 Nustep level 5 x 7 minutes Bike level 3 x 6 minutes Tmill 1.1 mph with a lot of cues for step length, heel strike UBE level 3 x 5 mintues Sidestepping on airex balance beam On airex reaching for numbers On airex eyes closed On airex head movements up and down and side to side Walking HHA working on step length, heel strike and speed Sit to stand trying with no hands Leg press 20# 2x10  12/05/23 Bike level 3 x 5 minutes Nustep level 5 x 5 minutes Turn step touch On airex touching numbers on wall called out Side step on and off airex [employer] Direction changes PWR moves in sitting [employer] kicks  PATIENT EDUCATION: Education details: POC Person educated: Patient and Child(ren) Education method: Explanation Education comprehension: verbalized understanding   HOME EXERCISE PROGRAM: Spoke with her and her son about some walking in the house with walker, seated kicks, marches and toe taps 10/03/23 added PWR moves to HEP  GOALS: Goals reviewed with patient? Yes  SHORT TERM GOALS: Target date: 10/24/23  Independent with initial HEP  Goal status: met 10/09/23   LONG TERM GOALS: Target date: 11/27/23  Independent with advanced HEP Goal status: progressing 02/06/24  2.  Decrease TUG time to 20 seconds Goal status: progressing 01/30/24  3.  Increase BERG balance test score to 42/56 Goal status: ongoing  01/30/24  4. Walk 360 feet in the 3 minute walk test Goal status: met 02/06/24  5.  Get up from sitting without using hands Goal status: still struggles but can do at times 01/30/24  ASSESSMENT:  CLINICAL IMPRESSION: Patient is a 78 y.o. female who was seen today for physical therapy evaluation and treatment for Parkinson's and weakness.  Patient did very well today, had a TUG of 21 seconds, at eval was 60 seconds.  Increased her ability  in the 3 minute walk test from 200 feet to now 375 feet.  Improvement in the 5xSTS test as well, good improvement OBJECTIVE IMPAIRMENTS Abnormal gait, decreased activity tolerance, decreased balance, decreased coordination, decreased endurance, decreased mobility, difficulty walking, decreased ROM, decreased strength, increased muscle spasms, impaired flexibility, improper body mechanics, postural dysfunction, and pain.   REHAB POTENTIAL: Good  CLINICAL DECISION MAKING: Stable/uncomplicated  EVALUATION COMPLEXITY: Low  PLAN: PT FREQUENCY: 1x/week  PT DURATION: 9 weeks  PLANNED INTERVENTIONS: Therapeutic exercises, Therapeutic activity, Neuromuscular re-education, Balance training, Gait training, Patient/Family education, Self Care, Joint mobilization, Stair training,  Electrical stimulation, Spinal mobilization, Cryotherapy, Moist heat, and Manual therapy  PLAN FOR NEXT SESSION: will assess next week and then she will see the MD, we will decide on further treatment at that time  Jearld Lesch, PT 02/11/2024, 1:55 PM

## 2024-02-12 NOTE — Progress Notes (Unsigned)
 Assessment/Plan:    1.  Parkinsons Disease             -Patient completed neurocognitive testing on January 25, 2021 with Dr. Roseanne [name].  Evidence of MCI only.  -Patient is status post bilateral STN DBS on July 07, 2021 with IPG placement on July 14, 2021.    -For now, she will continue carbidopa/levodopa 25/100, half a tablet daily.  We tried to increase it, but she did not like the resulting dyskinesias.  We did discuss that we may try Rytary in the future.  It is remembered that it may go generic later in the year.  -Continue carbidopa/levodopa 50/200 at bed as having trouble with first AM on  -She looks better now that she is using her walker and has had some physical therapy.  I did contact her physical therapist to see if they had some type of auto recall program in the next 6 months.  -some discomfort when hits scalp wire at night.  She did not tolerate gabapentin, pamelor or Lyrica.  On klonopin currently  2.  Dyskinesia             -none today but I am increasing med and will need to monitor 3.  Neurogenic Orthostatic Hypotension             -Did well on Northera.  Patient stopped that on her own.  Now on Florinef 0.1 mg daily 4.  Depression             -On sertraline.  Doing well. 5.  b12 deficiency             -on injections.  Level still low.  will check MMA/homocysteine.   6.  Urinary incontinence             -on myrbetriq.  She is doing well in that regard 7.  Lumbar radiculopathy             -She is status post lumbar foraminotomy with Dr. Venetia [name] on November 27, 2019.  Pain has reemerged and she underwent L4-L5 laminectomy with Dr. Jake [name] on May 16, 2022. 8.  Depression  -On sertraline, 100 mg daily. 9.  Dysphagia  -MBE done in June, 2023 with mild oropharyngeal dysphagia.  Regular diet with thin liquids recommended. 10.  History of multiple syncopal episodes, clustering, and occurring in any position (laying included)  -Extensive cardiac and neurowork-up has been  unremarkable thus far, including longterm EEG monitoring in hospital  -Cardiology recommending 1 month monitor, and has talked to her about loop recorder in the future  -Functional episodes cannot be ruled out (personally witnessed episodes).  Subjective:   [name] was seen today in follow up for levodopa challenge.  My previous records were reviewed prior to todays visit as well as outside records available to me.  Patient is with her son who supplements the history.  We added long-acting levodopa at bedtime last visit to help with first morning on and increased her daytime levodopa.  She reports that she backed down on the medication to only carbidopa/levodopa 25/100, 1/2 tablet in the AM and is still on the q hs CR.  She is currently in physical therapy sessions.  Last visit, I talked to her about using her walker at all times.  She was at the urgent care at [facility] December 20, although the fall apparently occurred on December 6.  She was actually going to grab her walker and it just went  out in front of her and she had a significant fall.  She was having prolonged pain in the right wrist, hand and hip.  Workup was negative for fractures.  The only other fall that she had occurred when she was trying to get up out of the chair and the walker was not right in front of her, but rather off to the side.  The walker ended up tipping over, and causing her to fall as well.  She did not get her at that time.  She saw Dr. Sharen [name] for her annual wellness exam on February 18.  She had labs February 13.  Her B12 was low at 228.  She was supposed to be on self administered monthly injections.  She reports that she is faithfully taking her injections.    Current prescribed movement disorder medications: Carbidopa/levodopa 25/100, 1 tablet 3 times per day (increased last visit) Carbidopa/levodopa 50/200 at bedtime (added last visit)  PREVIOUS MEDICATIONS: northera (worked well but pt d/c when they quit  sending b/c she owed $); amantadine (discontinued because of hallucinations); pramipexole (decreased in past because of hallucinations/dyskinesia); clonazepam (stopped it because of fatigue, but also because she just did not need it any longer); lyrica; pamelor (n/v)  ALLERGIES:   Allergies  Allergen Reactions   Iohexol Hives and Shortness Of Breath     Code: HIVES, Desc: PT developed 2 hives, followed by SOB, severe headache post 87cc's Omnipaque 300., Onset Date: [id number]    Amitriptyline Other (See Comments)    Sedated next morning   Ciprofloxacin Nausea And Vomiting   Cymbalta [Duloxetine Hcl] Other (See Comments)    Worsened depression   Lyrica [Pregabalin] Other (See Comments)    Tried during hospitalization - unsure effects but unable to tolerate   Imipramine Hcl Rash   Iodine Rash   Lidocaine Hcl Rash   Morphine Sulfate Rash   Neosporin [Neomycin-Bacitracin Zn-Polymyx] Rash and Other (See Comments)    Worsened skin breaking out   Sulfamethoxazole Rash   Tetracyclines & Related Rash    CURRENT MEDICATIONS:  Outpatient Encounter Medications as of 02/13/2024  Medication Sig   acetaminophen (TYLENOL) 500 MG tablet Take 2 tablets (1,000 mg total) by mouth in the morning, at noon, and at bedtime.   AMBULATORY NON FORMULARY MEDICATION Lift chair Dx:  G20   carbidopa-levodopa (SINEMET CR) 50-200 MG tablet TAKE 1 TABLET BY MOUTH EVERYDAY AT BEDTIME   carbidopa-levodopa (SINEMET IR) 25-100 MG tablet TAKE 1 TABLET BY MOUTH 3 (THREE) TIMES DAILY. 9AM/1PM/5PM (Patient taking differently: Take 1 tablet by mouth 3 (three) times daily. At 11am at 0.5 tablet)   Cholecalciferol (VITAMIN D3) 25 MCG (1000 UT) CAPS Take 1 capsule (1,000 Units total) by mouth daily.   clonazePAM (KLONOPIN) 0.5 MG tablet TAKE 1 TABLET BY MOUTH AT BEDTIME.   clotrimazole-betamethasone (LOTRISONE) cream Apply 1 application topically daily.   Cranberry-Vitamin C (AZO CRANBERRY URINARY TRACT) 250-60 MG CAPS Take 2  capsules by mouth daily.   cyanocobalamin (VITAMIN B12) 1000 MCG/ML injection INJECT 1 ML (1,000 MCG) INTRAMUSCULARLY EVERY 30 DAYS   fludrocortisone (FLORINEF) 0.1 MG tablet Take 1 tablet (100 mcg total) by mouth daily.   Glycerin-Hypromellose-PEG 400 (ARTIFICIAL TEARS) 0.2-0.2-1 % SOLN Place 1 drop into both eyes daily as needed (dry eyes).   nystatin cream (MYCOSTATIN) Apply 1 Application topically 2 (two) times daily. To affected area/rash   sertraline (ZOLOFT) 100 MG tablet Take 1 tablet (100 mg total) by mouth daily.   SYRINGE-NEEDLE, DISP, 3  ML (BD SAFETYGLIDE SYRINGE/NEEDLE) 25G X 1" 3 ML MISC Use to inject vitamin B12 monthly.   traMADol (ULTRAM) 50 MG tablet TAKE 1 TABLET (50 MG TOTAL) BY MOUTH EVERY 6 (SIX) HOURS AS NEEDED FOR MODERATE PAIN   zoledronic acid (RECLAST) 5 MG/100ML SOLN injection Inject 100 mLs (5 mg total) into the vein. yearly   [DISCONTINUED] Calcium Carbonate-Vitamin D (CALCIUM 600/VITAMIN D) 600-400 MG-UNIT chew tablet Chew 1 tablet by mouth daily.   [DISCONTINUED] latanoprost (XALATAN) 0.005 % ophthalmic solution Place 1 drop into both eyes at bedtime.   [DISCONTINUED] methylcellulose (CITRUCEL) oral powder Take 1 packet by mouth daily as needed (constipation).   [DISCONTINUED] mirabegron ER (MYRBETRIQ) 25 MG TB24 tablet Take 1 tablet (25 mg total) by mouth daily.   No facility-administered encounter medications on file as of 02/13/2024.    Objective:   PHYSICAL EXAMINATION:    VITALS:   Vitals:   02/13/24 0955  BP: 120/72  Pulse: 70  SpO2: 98%  Weight: 154 lb (69.9 kg)  Height: 5' 4.5" (1.638 m)   GEN:  The patient appears stated age and is in NAD. HEENT:  Normocephalic, atraumatic.  The mucous membranes are moist. The superficial temporal arteries are without ropiness or tenderness. CV:  RRR Lungs:  CTAB Neck/HEME:  There are no carotid bruits bilaterally.  Neurological examination:  Orientation: The patient is alert and oriented x3. Cranial  nerves: There is chronic L facial droop.  Smile is symmetric. The speech is fluent and hypophonic and occasionally dysarthric.  This is her baseline.  Soft palate rises symmetrically and there is no tongue deviation. Hearing is intact to conversational tone. Sensation: Sensation is intact to light touch throughout Motor: Strength is at least antigravity x4.  Movement examination: Tone: There is normal tone in the upper and lower extremities before and after programming Abnormal movements: I did not see dyskinesia or tremor today. Coordination:  There is no significant decremation today. Gait and Station: The patient pushes off of the chair to arise.  She gets her rollator and then actually walks quite well in the hall (her son makes a comment that she is walking much better for me than usual).     Chemistry      Component Value Date/Time   NA 142 01/10/2024 0911   K 3.9 01/10/2024 0911   CL 101 01/10/2024 0911   CO2 33 (H) 01/10/2024 0911   BUN 18 01/10/2024 0911   CREATININE 0.70 01/10/2024 0911   CREATININE 0.87 10/08/2019 0921      Component Value Date/Time   CALCIUM 9.3 01/10/2024 0911   ALKPHOS 109 01/10/2024 0911   AST 10 01/10/2024 0911   ALT 1 01/10/2024 0911   BILITOT 0.5 01/10/2024 0911       Lab Results  Component Value Date   WBC 4.9 01/10/2024   HGB 12.1 01/10/2024   HCT 37.0 01/10/2024   MCV 87.5 01/10/2024   PLT 264.0 01/10/2024    Lab Results  Component Value Date   TSH 1.12 01/10/2024    Total time spent on today's visit was 40 minutes, including both face-to-face time and nonface-to-face time.  Time included that spent on review of records (prior notes available to me/labs/imaging if pertinent), discussing treatment and goals, answering patient's questions and coordinating care.  This did not include DBS time.     Cc:  Eustaquio Boyden, MD

## 2024-02-13 ENCOUNTER — Other Ambulatory Visit

## 2024-02-13 ENCOUNTER — Ambulatory Visit: Payer: Medicare Other | Admitting: Neurology

## 2024-02-13 ENCOUNTER — Encounter: Payer: Self-pay | Admitting: Neurology

## 2024-02-13 VITALS — BP 120/72 | HR 70 | Ht 64.5 in | Wt 154.0 lb

## 2024-02-13 DIAGNOSIS — G20B2 Parkinson's disease with dyskinesia, with fluctuations: Secondary | ICD-10-CM | POA: Diagnosis not present

## 2024-02-13 DIAGNOSIS — E538 Deficiency of other specified B group vitamins: Secondary | ICD-10-CM

## 2024-02-13 DIAGNOSIS — Z9689 Presence of other specified functional implants: Secondary | ICD-10-CM | POA: Diagnosis not present

## 2024-02-13 NOTE — Procedures (Signed)
 DBS Programming was performed.    Manufacturer of DBS device: [employer], bluetooth  Total time spent programming was 8 minutes.  Device was confirmed to be on.  Soft start was confirmed to be on.  Impedences were checked and were within normal limits.  Battery was checked and was determined to be functioning normally and not near the end of life.  Final settings were as follows:    Active Contact Amplitude (mA) PW (ms) Frequency (hz) Side Effects  Left Brain       08/08/21 3-C+ 1.7 60 130   Other trials        1-C+ 2.7 60 130 tremor   (2/3/4)-C+ 3.0 60 130 Face pull above 2.1   (5/6/7)-C+ 1.8 60 130 ? Face pull (some baseline facial asymmetry)  08/22/21 5-(25%)7-(75%)C+ 2.5 60 159   09/05/21 final 5-(25%)6-(75%)C+ 2.9 60 159   Other trials 5-(50%)6-(50%)C+ 2.9 60 159    5-(25%)7-(75%)C+ 3.5 60 159 Tremor not controlled  10/31/21 5-(25%)7-(75%)C+ 3.3 60 159   05/01/22 5-(25%)7-(75%)C+ 3.6 60 159 Transient lip tingle  08/11/22 5-(25%)7-(75%)C+ 3.7 60 159   09/25/22 6-(25%)7-(75%)C+ 3.7 60 159   03/16/23 3c(25%)3b(75%)C+ 3.7 60 159   08/16/23 3c(25%)3b(75%)C+ 3.6 60 159   02/13/24 3c(25%)3b(75%)C+ 3.6 60 159                 Right Brain       08/08/21 (5/6/7)-C+ 1.6 60 130   Other trials 1-C+ 1.5 60 130 ? Speech change   (2/3/4)-C+ 1.5 60 130 Pretty good   8-C+ 1.6 60 130 tremor  08/22/21 8-C+ 2.7 60 136   09/05/21 final 6-(50%)8-(50%)C+ 2.9 60 159   others 6-(60%)8-(40%)C+ 2.8  60 136 Mouth dyskinesia  10/31/21 6-(50%)8-(50%)C+ 3.4 60 159   05/01/22 6-(50%)8-(50%)C+ 3.6 60 159   08/11/22 6-(50%)8-(50%)C+ 3.5 60 159   09/25/22 6-(50%)8-(50%)C+ 3.5 60 159   03/16/23 3b(50%)4(50%)C+ 3.5 60 159   08/16/23 3b(50%)4(50%)C+ 3.5 60 159   02/13/24 3b(50%)4(50%)C+ 3.5 Hold60 159

## 2024-02-14 ENCOUNTER — Other Ambulatory Visit: Payer: Self-pay | Admitting: Family Medicine

## 2024-02-14 DIAGNOSIS — M51369 Other intervertebral disc degeneration, lumbar region without mention of lumbar back pain or lower extremity pain: Secondary | ICD-10-CM

## 2024-02-15 NOTE — Telephone Encounter (Signed)
 Name of Medication: Tramadol Name of Pharmacy: [facility] [address] Last [place]ox Ahr or Written Date and Quantity: 01/03/24, #30 Last Office Visit and Type: 01/15/24, AWV Next Office Visit and Type: 07/15/24, 6 mo f/u Last Controlled Substance Agreement Date: 04/28/16 Last UDS: 04/28/16

## 2024-02-15 NOTE — Telephone Encounter (Signed)
 ERx

## 2024-02-17 LAB — HOMOCYSTEINE: Homocysteine: 16.1 umol/L — ABNORMAL HIGH (ref <10.4)

## 2024-02-17 LAB — METHYLMALONIC ACID, SERUM: Methylmalonic Acid, Quant: 92 nmol/L (ref 69–390)

## 2024-02-17 LAB — VITAMIN B12: Vitamin B-12: 385 pg/mL (ref 200–1100)

## 2024-02-21 ENCOUNTER — Other Ambulatory Visit: Payer: Self-pay | Admitting: Family Medicine

## 2024-02-21 DIAGNOSIS — E538 Deficiency of other specified B group vitamins: Secondary | ICD-10-CM

## 2024-02-22 ENCOUNTER — Other Ambulatory Visit (INDEPENDENT_AMBULATORY_CARE_PROVIDER_SITE_OTHER)

## 2024-02-22 DIAGNOSIS — E538 Deficiency of other specified B group vitamins: Secondary | ICD-10-CM | POA: Diagnosis not present

## 2024-02-22 LAB — IBC PANEL
Iron: 76 ug/dL (ref 42–145)
Saturation Ratios: 22.3 % (ref 20.0–50.0)
TIBC: 340.2 ug/dL (ref 250.0–450.0)
Transferrin: 243 mg/dL (ref 212.0–360.0)

## 2024-02-22 LAB — FERRITIN: Ferritin: 66.5 ng/mL (ref 10.0–291.0)

## 2024-02-22 LAB — FOLATE: Folate: 13.2 ng/mL (ref >5.9)

## 2024-02-25 ENCOUNTER — Telehealth: Payer: Self-pay | Admitting: Family Medicine

## 2024-02-25 LAB — INTRINSIC FACTOR ANTIBODIES: Intrinsic Factor: POSITIVE — AB

## 2024-02-25 NOTE — Telephone Encounter (Signed)
 Patient called back and stated that the name of the person that called was joella.

## 2024-02-25 NOTE — Telephone Encounter (Signed)
 Copied from CRM [id number]. Topic: General - Call Back - No Documentation >> Feb 25, 2024  3:14 PM Pascal Lux wrote: Reason for CRM: Patient called stated she received a missed call from the office but no voicemail. Requesting a call from Dr. Nicanor Alcon nurse.

## 2024-02-26 NOTE — Telephone Encounter (Signed)
 See 02/19/24 lab results under 'Result Notes'.

## 2024-02-26 NOTE — Telephone Encounter (Signed)
 Copied from CRM [id number]. Topic: General - Call Back - No Documentation >> Feb 26, 2024 11:07 AM Saverio Danker wrote: Reason for CRM:  Patient called stated she received a missed call from the office but no voicemail. Requesting a call from Dr. Nicanor Alcon nurse.

## 2024-02-27 ENCOUNTER — Other Ambulatory Visit

## 2024-02-27 ENCOUNTER — Encounter: Payer: Self-pay | Admitting: Family Medicine

## 2024-02-27 DIAGNOSIS — E538 Deficiency of other specified B group vitamins: Secondary | ICD-10-CM

## 2024-02-27 DIAGNOSIS — D510 Vitamin B12 deficiency anemia due to intrinsic factor deficiency: Secondary | ICD-10-CM | POA: Insufficient documentation

## 2024-02-27 MED ORDER — CYANOCOBALAMIN 1000 MCG/ML IJ SOLN: 1000.0000 ug | INTRAMUSCULAR | 3 refills | Status: DC

## 2024-03-17 ENCOUNTER — Other Ambulatory Visit: Payer: Self-pay | Admitting: Family Medicine

## 2024-03-17 DIAGNOSIS — F321 Major depressive disorder, single episode, moderate: Secondary | ICD-10-CM

## 2024-03-19 ENCOUNTER — Telehealth: Payer: Self-pay

## 2024-03-19 ENCOUNTER — Encounter: Payer: Self-pay | Admitting: Family Medicine

## 2024-03-19 DIAGNOSIS — E538 Deficiency of other specified B group vitamins: Secondary | ICD-10-CM

## 2024-03-19 MED ORDER — BD SAFETYGLIDE SYRINGE/NEEDLE 25G X 1" 3 ML MISC: 0 refills | Status: AC

## 2024-03-19 NOTE — Telephone Encounter (Signed)
 Name of Medication:  Clonazepam  Name of Pharmacy:  [facility] Last Ermelinda Hazard or Written Date and Quantity: 02/14/24, #30, #30 Last Office Visit and Type:  01/15/24, AWV Next Office Visit and Type:  07/15/24, 6 mo f/u Last Controlled Substance Agreement Date:  04/28/16 Last UDS:  04/28/16

## 2024-03-19 NOTE — Telephone Encounter (Signed)
 Clonazepam  refill request fwd to Dr Crissie Dome to authorize refill.   [facility]

## 2024-03-19 NOTE — Telephone Encounter (Signed)
 Copied from CRM [id number]. Topic: Clinical - Medication Question >> Mar 18, 2024  3:13 PM Taleah C wrote: Reason for CRM: patient called and stated that she received the Medicine for the b12 injection. However, she did not receive the needle. Please advise.

## 2024-03-19 NOTE — Telephone Encounter (Signed)
 Rx for syringes w/needle sent in (see 03/19/24 pt msg).

## 2024-03-19 NOTE — Telephone Encounter (Signed)
 ERx

## 2024-03-30 DIAGNOSIS — N3090 Cystitis, unspecified without hematuria: Secondary | ICD-10-CM | POA: Diagnosis not present

## 2024-03-30 DIAGNOSIS — R531 Weakness: Secondary | ICD-10-CM | POA: Diagnosis not present

## 2024-03-30 DIAGNOSIS — Z881 Allergy status to other antibiotic agents status: Secondary | ICD-10-CM | POA: Diagnosis not present

## 2024-03-30 DIAGNOSIS — E876 Hypokalemia: Secondary | ICD-10-CM | POA: Diagnosis not present

## 2024-03-30 DIAGNOSIS — R7989 Other specified abnormal findings of blood chemistry: Secondary | ICD-10-CM | POA: Diagnosis not present

## 2024-03-30 DIAGNOSIS — R55 Syncope and collapse: Secondary | ICD-10-CM | POA: Diagnosis not present

## 2024-03-30 DIAGNOSIS — N390 Urinary tract infection, site not specified: Secondary | ICD-10-CM | POA: Diagnosis not present

## 2024-03-30 DIAGNOSIS — H409 Unspecified glaucoma: Secondary | ICD-10-CM | POA: Diagnosis not present

## 2024-03-30 DIAGNOSIS — Z885 Allergy status to narcotic agent status: Secondary | ICD-10-CM | POA: Diagnosis not present

## 2024-03-30 DIAGNOSIS — N3281 Overactive bladder: Secondary | ICD-10-CM | POA: Diagnosis not present

## 2024-03-30 DIAGNOSIS — Z888 Allergy status to other drugs, medicaments and biological substances status: Secondary | ICD-10-CM | POA: Diagnosis not present

## 2024-03-30 DIAGNOSIS — I2510 Atherosclerotic heart disease of native coronary artery without angina pectoris: Secondary | ICD-10-CM | POA: Diagnosis not present

## 2024-03-30 DIAGNOSIS — R0902 Hypoxemia: Secondary | ICD-10-CM | POA: Diagnosis not present

## 2024-03-30 DIAGNOSIS — R791 Abnormal coagulation profile: Secondary | ICD-10-CM | POA: Diagnosis not present

## 2024-03-30 DIAGNOSIS — Z91041 Radiographic dye allergy status: Secondary | ICD-10-CM | POA: Diagnosis not present

## 2024-03-30 DIAGNOSIS — Z79899 Other long term (current) drug therapy: Secondary | ICD-10-CM | POA: Diagnosis not present

## 2024-03-30 DIAGNOSIS — G903 Multi-system degeneration of the autonomic nervous system: Secondary | ICD-10-CM | POA: Diagnosis not present

## 2024-03-30 DIAGNOSIS — G20A1 Parkinson's disease without dyskinesia, without mention of fluctuations: Secondary | ICD-10-CM | POA: Diagnosis not present

## 2024-03-30 DIAGNOSIS — E860 Dehydration: Secondary | ICD-10-CM | POA: Diagnosis not present

## 2024-03-30 DIAGNOSIS — Z882 Allergy status to sulfonamides status: Secondary | ICD-10-CM | POA: Diagnosis not present

## 2024-03-30 DIAGNOSIS — I10 Essential (primary) hypertension: Secondary | ICD-10-CM | POA: Diagnosis not present

## 2024-03-31 DIAGNOSIS — R0602 Shortness of breath: Secondary | ICD-10-CM | POA: Diagnosis not present

## 2024-03-31 DIAGNOSIS — R55 Syncope and collapse: Secondary | ICD-10-CM | POA: Diagnosis not present

## 2024-03-31 DIAGNOSIS — R7989 Other specified abnormal findings of blood chemistry: Secondary | ICD-10-CM | POA: Diagnosis not present

## 2024-04-01 DIAGNOSIS — R55 Syncope and collapse: Secondary | ICD-10-CM | POA: Diagnosis not present

## 2024-04-02 DIAGNOSIS — R55 Syncope and collapse: Secondary | ICD-10-CM | POA: Diagnosis not present

## 2024-04-03 ENCOUNTER — Telehealth: Payer: Self-pay

## 2024-04-03 NOTE — Transitions of Care (Post Inpatient/ED Visit) (Signed)
 04/03/2024  Name: [name] MRN: [id number] DOB: Apr 19, 1946  Today's TOC FU Call Status: Today's TOC FU Call Status:: Successful TOC FU Call Completed TOC FU Call Complete Date: 04/03/24 Patient's Name and Date of Birth confirmed.  Transition Care Management Follow-up Telephone Call Date of Discharge: 04/02/24 Discharge Facility: Other ([facility]) Name of Other ([facility]) Discharge Facility: [facility] Type of Discharge: Inpatient Admission Primary Inpatient Discharge Diagnosis:: Syncope and collapse How have you been since you were released from the hospital?: Same Any questions or concerns?: No  Items Reviewed: Did you receive and understand the discharge instructions provided?: Yes Medications obtained,verified, and reconciled?: Yes (Medications Reviewed)  Medications Reviewed Today: Medications Reviewed Today     Reviewed by Jamie Mccoy, RN (Registered Nurse) on 04/03/24 at 1059  Med List Status: <None>   Medication Order Taking? Sig Documenting Provider Last Dose Status Informant  acetaminophen  (TYLENOL ) 500 MG tablet [id number] Yes Take 2 tablets (1,000 mg total) by mouth in the morning, at noon, and at bedtime. Claire Crick, MD Taking Active Self, Multiple Informants           Med Note Raquel Cables, Lyndell Sanfilippo   Fri Jul 27, 2023  1:47 PM)    AMBULATORY NON [facility] MEDICATION [id number]  Lift chair Dx:  Rosetta Cons, DO  Active Self, Multiple Informants  carbidopa -levodopa  (SINEMET  CR) 50-200 MG tablet [id number] Yes TAKE 1 TABLET BY MOUTH EVERYDAY AT BEDTIME [name], Von Grumbling, DO Taking Active   carbidopa -levodopa  (SINEMET  IR) 25-100 MG tablet [id number] Yes TAKE 1 TABLET BY MOUTH 3 (THREE) TIMES DAILY. 9AM/1PM/5PM  Patient taking differently: Take 1 tablet by mouth 3 (three) times daily. At 11am at 0.5 tablet   [name], Von Grumbling, DO Taking Active   cefdinir (OMNICEF) 300 MG capsule [id number] Yes Take 300 mg by mouth 2 (two) times daily. 04/02/24 for 2 days  per Clinical summary [provider] Taking Active   Cholecalciferol  (VITAMIN D3) 25 MCG (1000 UT) CAPS [id number] Yes Take 1 capsule (1,000 Units total) by mouth daily. Claire Crick, MD Taking Active Self, Multiple Informants           Med Note Isidore Mares   Thu Jun 23, 2021  3:54 PM)    clonazePAM  (KLONOPIN ) 0.5 MG tablet [id number] Yes TAKE 1 TABLET BY MOUTH EVERYDAY AT BEDTIME Claire Crick, MD Taking Active   clotrimazole -betamethasone  (LOTRISONE ) cream [id number] No Apply 1 application topically daily.  Patient not taking: Reported on 04/03/2024   Claire Crick, MD Not Taking Active Self, Multiple Informants  Cranberry-Vitamin C  (AZO CRANBERRY URINARY TRACT) 250-60 MG CAPS [id number] Yes Take 2 capsules by mouth daily. Claire Crick, MD Taking Active Self, Multiple Informants  cyanocobalamin  (VITAMIN B12) 1000 MCG/ML injection [id number] Yes Inject 1 mL (1,000 mcg total) into the muscle every 21 ( twenty-one) days. Claire Crick, MD Taking Active   fludrocortisone  (FLORINEF ) 0.1 MG tablet [id number] Yes Take 1 tablet (100 mcg total) by mouth daily. Claire Crick, MD Taking Active   Glycerin -Hypromellose-PEG 400 (ARTIFICIAL TEARS) 0.2-0.2-1 % SOLN [id number] Yes Place 1 drop into both eyes daily as needed (dry eyes). [provider] Taking Active Self, Multiple Informants  nystatin  cream (MYCOSTATIN ) [id number] No Apply 1 Application topically 2 (two) times daily. To affected area/rash  Patient not taking: Reported on 04/03/2024   Claire Crick, MD Not Taking Active Self, Multiple Informants  sertraline  (ZOLOFT ) 100 MG tablet [id number] Yes Take 1 tablet (100 mg total) by mouth daily. Claire Crick, MD  Taking Active   SYRINGE-NEEDLE, DISP, 3 ML (BD SAFETYGLIDE SYRINGE/NEEDLE) 25G X 1" 3 ML MISC [id number]  Use to inject vitamin B12 monthly. Claire Crick, MD  Active   traMADol  (ULTRAM ) 50 MG tablet [id number] Yes TAKE 1 TABLET (50 MG TOTAL) BY  MOUTH EVERY 6 (SIX) HOURS AS NEEDED FOR MODERATE PAIN Claire Crick, MD Taking Active   zoledronic  acid (RECLAST ) 5 MG/100ML SOLN injection [id number] Yes Inject 100 mLs (5 mg total) into the vein. yearly Claire Crick, MD Taking Active             Goals Addressed             This Visit's Progress    VBCI Transitions of Care (TOC) Care Plan       Problems:  Recent Hospitalization for treatment of  Syncope/Collapse  No Specialist appointment  Urology appointment 1-3 weeks, states she will call today to set up with her son's schedule  Goal:  Over the next 30 days, the patient will not experience hospital readmission  Interventions:  Transitions of Care: Doctor Visits  - discussed the importance of doctor visits Reviewed Signs and symptoms of infection from UTI on antibiotics next two days, follow up with urology appointment 1-3 weeks per DC notes.  Patient Self Care Activities:  Attend all scheduled provider appointments Call provider office for new concerns or questions  Take medications as prescribed    Plan:  The patient has been provided with contact information for the care management team and has been advised to call with any health related questions or concerns.  Currently, has a follow up for Week 2 with Arna Better for Thursday, Apr 10, 2024 at 1:00 PM           SDOH Interventions Today    Flowsheet Row Most Recent Value  SDOH Interventions   Food Insecurity Interventions Intervention Not Indicated  Housing Interventions Intervention Not Indicated  Transportation Interventions Intervention Not Indicated  Utilities Interventions Intervention Not Indicated      Brown [place], RN, BSN, CCM [place]  [facility], [facility] RN Care Manager Direct Dial: [phone]

## 2024-04-03 NOTE — Patient Instructions (Signed)
 Visit Information  Thank you for taking time to visit with me today. Please don't hesitate to contact me if I can be of assistance to you before our next scheduled telephone appointment.  Our next appointment is by telephone on Apr 10, 2024 at 1 pm with Arna Better, RN  Following is a copy of your care plan:   Goals Addressed             This Visit's Progress    VBCI Transitions of Care (TOC) Care Plan       Problems:  Recent Hospitalization for treatment of  Syncope/Collapse  No Specialist appointment  Urology appointment 1-3 weeks, states she will call today to set up with her son's schedule  Goal:  Over the next 30 days, the patient will not experience hospital readmission  Interventions:  Transitions of Care: Doctor Visits  - discussed the importance of doctor visits Reviewed Signs and symptoms of infection from UTI on antibiotics next two days, follow up with urology appointment 1-3 weeks per DC notes.  Patient Self Care Activities:  Attend all scheduled provider appointments Call provider office for new concerns or questions  Take medications as prescribed    Plan:  The patient has been provided with contact information for the care management team and has been advised to call with any health related questions or concerns.  Currently, has a follow up for Week 2 with Arna Better for Thursday, Apr 10, 2024 at 1:00 PM        Patient verbalizes understanding of instructions and care plan provided today and agrees to view in [name]. Active MyChart status and patient understanding of how to access instructions and care plan via MyChart confirmed with patient.     Telephone follow up appointment with care management team member scheduled for: The patient has been provided with contact information for the care management team and has been advised to call with any health related questions or concerns.  Next PCP appointment scheduled for:  Apr 16, 2024 with Dr. Mariam Shingles and  she states that she is on the list to call for a sooner appointment if they have a cancellation  Please call the care guide team at [phone] if you need to cancel or reschedule your appointment.   Please call the [place]  National Suicide Prevention Lifeline: 269-796-9620 or TTY: (951) 614-9466 TTY (580)219-2900) to talk to a trained counselor if you are experiencing a Mental Health or Behavioral Health Crisis or need someone to talk to.  Brown Cape, RN, BSN, CCM [facility], [facility] RN Care Manager Direct Dial: [phone]

## 2024-04-03 NOTE — Telephone Encounter (Signed)
 I spoke with [name] (DPR signed) [name] is in [place] and will have [facility] call our office. [name] is not sure ow pt is doing so far today since he is in [place]. Sending note to [facility] triage.

## 2024-04-03 NOTE — Telephone Encounter (Signed)
  Source  Mimbs, [name] (Patient)   Subject  [name], [name] (Patient)   Topic  Appointments - Scheduling Inquiry for Clinic     Communication  Reason for CRM: Patient calling in discharged from hospital 04/02/24 need hospital follow up appt   Patient insisting seeing Dr Mariam Shingles for this appt and no one else. Please call patient to schedule appt [phone] ok to leave a detailed message                       Copied from CRM [id number]. Topic: Appointments - Scheduling Inquiry for Clinic >> Apr 02, 2024  3:00 PM Nurse Sheridan Din I wrote: Left v/m pt to call for HFU ONE WEEK.

## 2024-04-03 NOTE — Telephone Encounter (Signed)
 I spoke with pt; and she said she is better than when in hospital; today pt feels tired but pt does want to keep appt already made with Dr Crissie Dome on 04/16/24.  If pt feels like she needs HFU sooner pt will call [facility]. UC & ED precautions given and pt voiced understanding.sending FYI to Dr Vallarie Gauze who is in office.

## 2024-04-03 NOTE — Telephone Encounter (Signed)
 Unable to reach pt by phone, pts v/m is full and  could not leave message. Per DPR left v/m for Scott % [name] to contact [facility] [phone].per appt notes pt already has appt for HFU with Dr Crissie Dome on 04/16/24. Sending note to [facility] triage.

## 2024-04-03 NOTE — Telephone Encounter (Signed)
 Noted. Thanks.

## 2024-04-04 ENCOUNTER — Telehealth: Payer: Self-pay | Admitting: Neurology

## 2024-04-04 NOTE — Telephone Encounter (Signed)
 Called to make a DBS appt 60 mins for patient with Dr [name]  patient just got out of the hospital

## 2024-04-07 ENCOUNTER — Telehealth: Payer: Self-pay | Admitting: Neurology

## 2024-04-07 NOTE — Telephone Encounter (Signed)
 Called but could not leave a VM ( it was full) I was trying to schedule a follow up with [name] from patient being in the hospital.   Per [name] patient needs to follow up with the PCP first and then sch a DBS 60 mins appt with [name]

## 2024-04-10 ENCOUNTER — Encounter: Payer: Self-pay | Admitting: *Deleted

## 2024-04-10 ENCOUNTER — Telehealth: Payer: Self-pay | Admitting: *Deleted

## 2024-04-11 ENCOUNTER — Encounter: Payer: Self-pay | Admitting: *Deleted

## 2024-04-14 ENCOUNTER — Encounter: Payer: Self-pay | Admitting: *Deleted

## 2024-04-14 NOTE — Transitions of Care (Post Inpatient/ED Visit) (Unsigned)
 Care Management  Transitions of Care Program Transitions of Care Post-discharge week 2  04/14/2024 Name: [name] MRN: [id number] DOB: 30-Dec-1945  Subjective: [name] is a 78 y.o. year old female who is a primary care patient of Claire Crick, MD. The Care Management team was unable to reach the patient by phone to assess and address transitions of care needs.   RNCM performed case closure.  Plan: No further outreach attempts will be made at this time.  We have been unable to reach the patient.  Arna Better RN, BSN [place]  [facility] RN Care Manager [phone]

## 2024-04-16 ENCOUNTER — Encounter: Payer: Self-pay | Admitting: Family Medicine

## 2024-04-16 ENCOUNTER — Ambulatory Visit: Admitting: Family Medicine

## 2024-04-16 VITALS — BP 130/78 | HR 73 | Temp 98.1°F | Ht 64.5 in | Wt 152.2 lb

## 2024-04-16 DIAGNOSIS — G20B1 Parkinson's disease with dyskinesia, without mention of fluctuations: Secondary | ICD-10-CM | POA: Diagnosis not present

## 2024-04-16 DIAGNOSIS — R55 Syncope and collapse: Secondary | ICD-10-CM

## 2024-04-16 DIAGNOSIS — N3946 Mixed incontinence: Secondary | ICD-10-CM

## 2024-04-16 DIAGNOSIS — D510 Vitamin B12 deficiency anemia due to intrinsic factor deficiency: Secondary | ICD-10-CM | POA: Diagnosis not present

## 2024-04-16 DIAGNOSIS — I951 Orthostatic hypotension: Secondary | ICD-10-CM | POA: Diagnosis not present

## 2024-04-16 DIAGNOSIS — E538 Deficiency of other specified B group vitamins: Secondary | ICD-10-CM

## 2024-04-16 DIAGNOSIS — N390 Urinary tract infection, site not specified: Secondary | ICD-10-CM

## 2024-04-16 LAB — POC URINALSYSI DIPSTICK (AUTOMATED)
Bilirubin, UA: NEGATIVE
Blood, UA: NEGATIVE
Glucose, UA: NEGATIVE
Ketones, UA: NEGATIVE
Nitrite, UA: NEGATIVE
Protein, UA: POSITIVE — AB
Spec Grav, UA: 1.02 (ref 1.010–1.025)
Urobilinogen, UA: 0.2 U/dL
pH, UA: 6 (ref 5.0–8.0)

## 2024-04-16 LAB — CBC WITH DIFFERENTIAL/PLATELET
Basophils Absolute: 0 10*3/uL (ref 0.0–0.1)
Basophils Relative: 0.7 % (ref 0.0–3.0)
Eosinophils Absolute: 0.1 10*3/uL (ref 0.0–0.7)
Eosinophils Relative: 2 % (ref 0.0–5.0)
HCT: 37.1 % (ref 36.0–46.0)
Hemoglobin: 12.1 g/dL (ref 12.0–15.0)
Lymphocytes Relative: 37.9 % (ref 12.0–46.0)
Lymphs Abs: 2 10*3/uL (ref 0.7–4.0)
MCHC: 32.6 g/dL (ref 30.0–36.0)
MCV: 85.6 fl (ref 78.0–100.0)
Monocytes Absolute: 0.3 10*3/uL (ref 0.1–1.0)
Monocytes Relative: 6.4 % (ref 3.0–12.0)
Neutro Abs: 2.8 10*3/uL (ref 1.4–7.7)
Neutrophils Relative %: 53 % (ref 43.0–77.0)
Platelets: 290 10*3/uL (ref 150.0–400.0)
RBC: 4.33 Mil/uL (ref 3.87–5.11)
RDW: 14.8 % (ref 11.5–15.5)
WBC: 5.4 10*3/uL (ref 4.0–10.5)

## 2024-04-16 LAB — BASIC METABOLIC PANEL WITH GFR
BUN: 13 mg/dL (ref 6–23)
CO2: 31 meq/L (ref 19–32)
Calcium: 9.4 mg/dL (ref 8.4–10.5)
Chloride: 102 meq/L (ref 96–112)
Creatinine, Ser: 0.7 mg/dL (ref 0.40–1.20)
GFR: 84.37 mL/min (ref >60.00)
Glucose, Bld: 98 mg/dL (ref 70–99)
Potassium: 3.7 meq/L (ref 3.5–5.1)
Sodium: 140 meq/L (ref 135–145)

## 2024-04-16 LAB — MAGNESIUM: Magnesium: 1.9 mg/dL (ref 1.5–2.5)

## 2024-04-16 NOTE — Progress Notes (Signed)
 Ph: [phone] Fax: [phone]   Patient ID: [name], female    DOB: [birth year], 78 y.o.   MRN: [id number]  This visit was conducted in person.  BP 130/78   Pulse 73   Temp 98.1 F (36.7 C) (Oral)   Ht 5' 4.5" (1.638 m)   Wt 152 lb 4 oz (69.1 kg)   SpO2 94%   BMI 25.73 kg/m    CC: ER f/u visit  Subjective:   HPI: [name] is a 78 y.o. female presenting on 04/16/2024 for Hospitalization Follow-up (Admitted on 03/30/24 at [facility], dx syncope and collapse; cystitis; elevated D-dimer. Pt accompanied by son, Dee Farber.)   Recent hospitalization for syncope while sitting at church, brought in by EMS.  Found to have abnormal UA /UTI - initially treated with ceftriaxone . UCx grew E coli and aerococcus. D dimer elevated, VQ scan low probability for PE. Found to have low magnesium  and potassium - repleted. Anemic with Hgb 10.4.  Hospital records reviewed. Med rec performed.  Discharged on omnicef, fludrocortisone  changed to 0.2mg  daily.  BP was elevated in the hospital.  She was treated in hospital with flomax and gemtesa - didn't note significant benefit with these and her bladder symptoms.   Notes longstanding history of OAB along with stress incontinence symptoms worse at night time, occasional incomplete emptying. Only on cranberry tablets. - she states she's not taking myrbetriq  (unaffordable) or alfuzosin . She denies significant UTI symptoms. She previously saw Dr Ottelin at [facility]rology last 12/2016. Also saw Dr Luster Salters in [place] for interstitial cystitis.   Currently not having any UTI symptoms.   Since home overall feeling well, staying fatigued, low energy.   Home health was ordered but insurance denied this.  Other follow up appointments scheduled: neurology with Dr [name] 5/29.  ______________________________________________________________________ Hospital admission: 03/30/2024 Hospital discharge: 04/02/2024 TCM f/u phone call:  performed on  04/03/2024  Recommendations to physicians/followup needed:  1) F/u PCP 2) F/u Neurology, consider changing overactive bladder meds  3) f/u urology for the same - OAB issues 4) monitor BP on increased florinef  - consider midodrine    Hospital Problems  *Syncope and collapse      Relevant past medical, surgical, family and social history reviewed and updated as indicated. Interim medical history since our last visit reviewed. Allergies and medications reviewed and updated. Outpatient Medications Prior to Visit  Medication Sig Dispense Refill   acetaminophen  (TYLENOL ) 500 MG tablet Take 2 tablets (1,000 mg total) by mouth in the morning, at noon, and at bedtime.     AMBULATORY NON FORMULARY MEDICATION Lift chair Dx:  G20 1 Device 0   carbidopa -levodopa  (SINEMET  CR) 50-200 MG tablet TAKE 1 TABLET BY MOUTH EVERYDAY AT BEDTIME 90 tablet 0   carbidopa -levodopa  (SINEMET  IR) 25-100 MG tablet TAKE 1 TABLET BY MOUTH 3 (THREE) TIMES DAILY. 9AM/1PM/5PM (Patient taking differently: Take 1 tablet by mouth 3 (three) times daily. At 11am at 0.5 tablet) 270 tablet 0   Cholecalciferol  (VITAMIN D3) 25 MCG (1000 UT) CAPS Take 1 capsule (1,000 Units total) by mouth daily. 30 capsule    clonazePAM  (KLONOPIN ) 0.5 MG tablet TAKE 1 TABLET BY MOUTH EVERYDAY AT BEDTIME 30 tablet 0   clotrimazole -betamethasone  (LOTRISONE ) cream Apply 1 application topically daily. 30 g 0   Cranberry-Vitamin C  (AZO CRANBERRY URINARY TRACT) 250-60 MG CAPS Take 2 capsules by mouth daily.     cyanocobalamin  (VITAMIN B12) 1000 MCG/ML injection Inject 1 mL (1,000 mcg total) into the muscle every 21 (  twenty-one) days. 5 mL 3   fludrocortisone  (FLORINEF ) 0.1 MG tablet Take 1 tablet (100 mcg total) by mouth daily. 30 tablet 6   Glycerin -Hypromellose-PEG 400 (ARTIFICIAL TEARS) 0.2-0.2-1 % SOLN Place 1 drop into both eyes daily as needed (dry eyes).     Latanoprost  PF 0.005 % SOLN Apply 1 drop to eye daily.     nystatin  cream (MYCOSTATIN ) Apply  1 Application topically 2 (two) times daily. To affected area/rash 30 g 0   sertraline  (ZOLOFT ) 100 MG tablet Take 1 tablet (100 mg total) by mouth daily. 100 tablet 3   SYRINGE-NEEDLE, DISP, 3 ML (BD SAFETYGLIDE SYRINGE/NEEDLE) 25G X 1" 3 ML MISC Use to inject vitamin B12 monthly. 50 each 0   traMADol  (ULTRAM ) 50 MG tablet TAKE 1 TABLET (50 MG TOTAL) BY MOUTH EVERY 6 (SIX) HOURS AS NEEDED FOR MODERATE PAIN 30 tablet 3   zoledronic  acid (RECLAST ) 5 MG/100ML SOLN injection Inject 100 mLs (5 mg total) into the vein. yearly     cefdinir (OMNICEF) 300 MG capsule Take 300 mg by mouth 2 (two) times daily. 04/02/24 for 2 days per Clinical summary     No facility-administered medications prior to visit.     Per HPI unless specifically indicated in ROS section below Review of Systems  Objective:  BP 130/78   Pulse 73   Temp 98.1 F (36.7 C) (Oral)   Ht 5' 4.5" (1.638 m)   Wt 152 lb 4 oz (69.1 kg)   SpO2 94%   BMI 25.73 kg/m   Wt Readings from Last 3 Encounters:  04/16/24 152 lb 4 oz (69.1 kg)  02/13/24 154 lb (69.9 kg)  01/15/24 156 lb 2 oz (70.8 kg)      Physical Exam Vitals and nursing note reviewed.  Constitutional:      Appearance: Normal appearance. She is not ill-appearing.  HENT:     Head: Normocephalic and atraumatic.     Mouth/Throat:     Mouth: Mucous membranes are moist.     Pharynx: Oropharynx is clear. No oropharyngeal exudate or posterior oropharyngeal erythema.  Eyes:     Extraocular Movements: Extraocular movements intact.     Pupils: Pupils are equal, round, and reactive to light.  Cardiovascular:     Rate and Rhythm: Normal rate and regular rhythm.     Pulses: Normal pulses.     Heart sounds: Murmur (2/6 systolic) heard.  Pulmonary:     Effort: Pulmonary effort is normal. No respiratory distress.     Breath sounds: Normal breath sounds. No wheezing, rhonchi or rales.  Musculoskeletal:     Right lower leg: No edema.     Left lower leg: No edema.  Skin:     General: Skin is warm and dry.     Findings: No rash.  Neurological:     Mental Status: She is alert.  Psychiatric:        Mood and Affect: Mood normal.        Behavior: Behavior normal.       Results for orders placed or performed in visit on 04/16/24  Basic metabolic panel with GFR   Collection Time: 04/16/24 12:41 PM  Result Value Ref Range   Sodium 140 135 - 145 mEq/L   Potassium 3.7 3.5 - 5.1 mEq/L   Chloride 102 96 - 112 mEq/L   CO2 31 19 - 32 mEq/L   Glucose, Bld 98 70 - 99 mg/dL   BUN 13 6 - 23 mg/dL  Creatinine, Ser 0.70 0.40 - 1.20 mg/dL   GFR 19.14 >78.29 mL/min   Calcium  9.4 8.4 - 10.5 mg/dL  CBC with Differential/Platelet   Collection Time: 04/16/24 12:41 PM  Result Value Ref Range   WBC 5.4 4.0 - 10.5 K/uL   RBC 4.33 3.87 - 5.11 Mil/uL   Hemoglobin 12.1 12.0 - 15.0 g/dL   HCT 56.2 13.0 - 86.5 %   MCV 85.6 78.0 - 100.0 fl   MCHC 32.6 30.0 - 36.0 g/dL   RDW 78.4 69.6 - 29.5 %   Platelets 290.0 150.0 - 400.0 K/uL   Neutrophils Relative % 53.0 43.0 - 77.0 %   Lymphocytes Relative 37.9 12.0 - 46.0 %   Monocytes Relative 6.4 3.0 - 12.0 %   Eosinophils Relative 2.0 0.0 - 5.0 %   Basophils Relative 0.7 0.0 - 3.0 %   Neutro Abs 2.8 1.4 - 7.7 K/uL   Lymphs Abs 2.0 0.7 - 4.0 K/uL   Monocytes Absolute 0.3 0.1 - 1.0 K/uL   Eosinophils Absolute 0.1 0.0 - 0.7 K/uL   Basophils Absolute 0.0 0.0 - 0.1 K/uL  Magnesium    Collection Time: 04/16/24 12:41 PM  Result Value Ref Range   Magnesium  1.9 1.5 - 2.5 mg/dL  POCT Urinalysis Dipstick (Automated)   Collection Time: 04/16/24 12:49 PM  Result Value Ref Range   Color, UA yellow    Clarity, UA cloudy    Glucose, UA Negative Negative   Bilirubin, UA negative    Ketones, UA negative    Spec Grav, UA 1.020 1.010 - 1.025   Blood, UA negative    pH, UA 6.0 5.0 - 8.0   Protein, UA Positive (A) Negative   Urobilinogen, UA 0.2 0.2 or 1.0 E.U./dL   Nitrite, UA negative    Leukocytes, UA Small (1+) (A) Negative  Urine  Culture   Collection Time: 04/16/24  2:11 PM   Specimen: Urine  Result Value Ref Range   MICRO NUMBER: [id number]    SPECIMEN QUALITY: Adequate    Sample Source URINE    STATUS: FINAL    Result:      Less than 10,000 CFU/mL of single Gram negative organism isolated. No further testing will be performed. If clinically indicated, recollection using a method to minimize contamination, with prompt transfer to Urine Culture Transport Tube, is recommended.   *Note: Due to a large number of results and/or encounters for the requested time period, some results have not been displayed. A complete set of results can be found in Results Review.   Micro: WBC 3-5 RBC rare Bact TNTC - diplococci Casts - none Epi few UCx sent   Assessment & Plan:   Problem List Items Addressed This Visit     Parkinson's disease (HCC) (Chronic)   Appreciate neurology care.      Vitamin B12 deficiency   Recent diagnosis of pernicious anemia.  Increased frequency of b12 shots at home to Q3 wks.       Orthostatic hypotension   Fludrocortisone  was increased to 0.2mg  daily. Previously midodrine  caused headache.       Recurrent UTI   Unclear if truly had a UTI vs colonization given no significant urinary tract symptoms.  UCx in hospital grew E coli and aerococcus bacteria.  She was treated with ceftriaxone  followed by oral cefdinir to complete 5d course.  Discussed suspected bacterial colonization.  Will update UA and UCx today.  She has remote h/o interstitial cystitis previously followed by Dr Luster Salters then [facility]  urologists.  Discussed return to uro pending UA/culture results, or if symptoms of UTI develop.      Relevant Orders   POCT Urinalysis Dipstick (Automated) (Completed)   Urine Culture (Completed)   Syncope - Primary   Of unclear cause Initially thought to be due to alfuzosin  use - however she confirms she's not been on this medication for years.  Has previously had unrevealing cardiac and  neurologic evaluations for syncope.  Most suspect component of autonomic instability contributing.  Florinef  was increased to 0.2mg  daily - continue this.  Keep neuro f/u next week.  Discussed if recurrent syncope/presyncope, would then recommend return to see cardiology (Revankar) - last seen 06/2023.       Relevant Orders   Basic metabolic panel with GFR (Completed)   CBC with Differential/Platelet (Completed)   Magnesium  (Completed)   Mixed stress and urge urinary incontinence   Ongoing difficulty Avoiding antimuscarinics, alpha blockers. Myrbetriq  was unaffordable. She states she previously completed urodynamics - no results available.  Consider return to urology for re-evaluation - last saw [facility] 2018.       Pernicious anemia   Recent diagnosis.  Increased frequency of b12 shots at home to Q3 wks.        No orders of the defined types were placed in this encounter.  Orders Placed This Encounter  Procedures   Urine Culture   Basic metabolic panel with GFR   CBC with Differential/Platelet   Magnesium    POCT Urinalysis Dipstick (Automated)    Patient Instructions  Labs and urine test today Continue current medicines including higher fludrocortisone  dose at least until neurology follow up next week.  If recurrent passing out, return to see cardiology Dr Lafayette Pierre.  Let me know if you develop urinary symptoms such as burning or worsening incontinence for referral back to [facility].   Follow up plan: Return if symptoms worsen or fail to improve.  Claire Crick, MD

## 2024-04-16 NOTE — Patient Instructions (Addendum)
 Labs and urine test today Continue current medicines including higher fludrocortisone  dose at least until neurology follow up next week.  If recurrent passing out, return to see cardiology Dr Lafayette Pierre.  Let me know if you develop urinary symptoms such as burning or worsening incontinence for referral back to [facility].

## 2024-04-17 ENCOUNTER — Other Ambulatory Visit: Payer: Self-pay | Admitting: Neurology

## 2024-04-17 ENCOUNTER — Ambulatory Visit: Payer: Self-pay | Admitting: Family Medicine

## 2024-04-17 ENCOUNTER — Other Ambulatory Visit: Payer: Self-pay | Admitting: Family Medicine

## 2024-04-17 DIAGNOSIS — G20B2 Parkinson's disease with dyskinesia, with fluctuations: Secondary | ICD-10-CM

## 2024-04-17 DIAGNOSIS — F321 Major depressive disorder, single episode, moderate: Secondary | ICD-10-CM

## 2024-04-17 LAB — URINE CULTURE
MICRO NUMBER:: 16484660
SPECIMEN QUALITY:: ADEQUATE

## 2024-04-18 ENCOUNTER — Encounter: Payer: Self-pay | Admitting: Family Medicine

## 2024-04-18 NOTE — Assessment & Plan Note (Addendum)
 Unclear if truly had a UTI vs colonization given no significant urinary tract symptoms.  UCx in hospital grew E coli and aerococcus bacteria.  She was treated with ceftriaxone  followed by oral cefdinir to complete 5d course.  Discussed suspected bacterial colonization.  Will update UA and UCx today.  She has remote h/o interstitial cystitis previously followed by Dr Luster Salters then [facility] urologists.  Discussed return to uro pending UA/culture results, or if symptoms of UTI develop.

## 2024-04-18 NOTE — Assessment & Plan Note (Signed)
 Ongoing difficulty Avoiding antimuscarinics, alpha blockers. Myrbetriq  was unaffordable. She states she previously completed urodynamics - no results available.  Consider return to urology for re-evaluation - last saw [facility] 2018.

## 2024-04-18 NOTE — Assessment & Plan Note (Signed)
 Recent diagnosis.  Increased frequency of b12 shots at home to Q3 wks.

## 2024-04-18 NOTE — Assessment & Plan Note (Signed)
Appreciate neurology care.  

## 2024-04-18 NOTE — Telephone Encounter (Signed)
 ERx

## 2024-04-18 NOTE — Assessment & Plan Note (Signed)
 Fludrocortisone  was increased to 0.2mg  daily. Previously midodrine  caused headache.

## 2024-04-18 NOTE — Assessment & Plan Note (Signed)
 Recent diagnosis of pernicious anemia.  Increased frequency of b12 shots at home to Q3 wks.

## 2024-04-18 NOTE — Assessment & Plan Note (Addendum)
 Of unclear cause Initially thought to be due to alfuzosin  use - however she confirms she's not been on this medication for years.  Has previously had unrevealing cardiac and neurologic evaluations for syncope.  Most suspect component of autonomic instability contributing.  Florinef  was increased to 0.2mg  daily - continue this.  Keep neuro f/u next week.  Discussed if recurrent syncope/presyncope, would then recommend return to see cardiology (Revankar) - last seen 06/2023.

## 2024-04-22 NOTE — Progress Notes (Unsigned)
 Assessment/Plan:    1.  Parkinsons Disease             -Patient completed neurocognitive testing on January 25, 2021 with Dr. Annette Barters.  Evidence of MCI only.  -Patient is status post bilateral STN DBS on July 07, 2021 with IPG placement on July 14, 2021.    -For now, she will continue carbidopa /levodopa  25/100, she is only taking 1 tablet twice a day.  I told her she can take an extra as needed.  -Continue carbidopa /levodopa  50/200 at bed   - She asked me about the scalp wire, which she can feel, which is not uncommon.  I looked at her scalp, and I did not see anything unusual.  We have tried medication in the past for discomfort, including gabapentin , nortriptyline , Lyrica , clonazepam .  I told her not to mess with the area, and she states that she really is not.  2.  Dyskinesia             - None noted currently. 3.  Neurogenic Orthostatic Hypotension             -Did well on Northera .  Patient stopped that on her own.   -Now on Florinef  0.2 mg daily, increased when in the hospital May, 2025 at [facility] - She is currently wearing compression stockings.  She does not like them.  We talked about the abdominal compression binder.  She admits she has 1 at home, but has not worn it in a long time.  I encouraged her to do so.  She has been on droxidopa  in the past, but had weaned off of it (she did not recall that) 4.  Depression             -On sertraline .  Doing well. 5.  b12 deficiency             -on injections.  Level still low.  will check MMA/homocysteine.   6.  Urinary incontinence             -on myrbetriq .  She is doing well in that regard 7.  Lumbar radiculopathy             -She is status post lumbar foraminotomy with Dr. Nigel Bart on November 27, 2019.  Pain has reemerged and she underwent L4-L5 laminectomy with Dr. Julane Ny on May 16, 2022. 8.  Depression  -On sertraline , 100 mg daily. 9.  Dysphagia  -MBE done in June, 2023 with mild oropharyngeal dysphagia.  Regular diet with  thin liquids recommended. 10.  History of multiple syncopal episodes, clustering, and occurring in any position (laying included)  -Extensive cardiac and neurowork-up has been unremarkable thus far, including longterm EEG monitoring in hospital  - She needs to follow back up with cardiology.  Cardiology previously loop recorder.  She stated that she has already completed long-term monitoring, but had been resistant to going back to cardiology.  I encouraged her to do that.  - Functional events are on the listed differential, after witnessing episodes   Subjective:   [name] was seen today in follow up for Parkinsons and posthospital follow-up.  Records are reviewed from [facility].  Patient had passing out episode while at church.  Her son states that she had walked up to the front of the church and had been seated for at least 10 min when she passed out.  Son thinks that she had passed out a few min.  No loss of bladder/bowel control.  She was treated in the hospital for possible UTI.  While in the hospital, they noted that they increased her Florinef  to 0.2 mg daily.  They did this because of orthostasis in the emergency room.  She saw her primary care physician May 21 in follow-up.  Current prescribed movement disorder medications: Carbidopa /levodopa  25/100, 10am and 3-4 pm Carbidopa /levodopa  50/200 at bedtime   PREVIOUS MEDICATIONS: northera  (worked well but pt d/c when they quit sending b/c she owed $); amantadine  (discontinued because of hallucinations); pramipexole  (decreased in past because of hallucinations/dyskinesia); clonazepam  (stopped it because of fatigue, but also because she just did not need it any longer); lyrica ; pamelor  (n/v)  ALLERGIES:   Allergies  Allergen Reactions   Iohexol Hives and Shortness Of Breath     Code: HIVES, Desc: PT developed 2 hives, followed by SOB, severe headache post 87cc's Omnipaque 300., Onset Date: [id number]    Amitriptyline Other (See  Comments)    Sedated next morning   Ciprofloxacin  Nausea And Vomiting   Cymbalta  [Duloxetine  Hcl] Other (See Comments)    Worsened depression   Lyrica  [Pregabalin ] Other (See Comments)    Tried during hospitalization - unsure effects but unable to tolerate   Imipramine Hcl Rash   Iodine Rash   Lidocaine  Hcl Rash   Morphine Sulfate Rash   Neosporin [Neomycin-Bacitracin  Zn-Polymyx] Rash and Other (See Comments)    Worsened skin breaking out   Sulfamethoxazole Rash   Tetracyclines & Related Rash    CURRENT MEDICATIONS:  Outpatient Encounter Medications as of 04/24/2024  Medication Sig   acetaminophen  (TYLENOL ) 500 MG tablet Take 2 tablets (1,000 mg total) by mouth in the morning, at noon, and at bedtime.   AMBULATORY NON FORMULARY MEDICATION Lift chair Dx:  G20   carbidopa -levodopa  (SINEMET  CR) 50-200 MG tablet TAKE 1 TABLET BY MOUTH EVERYDAY AT BEDTIME   carbidopa -levodopa  (SINEMET  IR) 25-100 MG tablet TAKE 1 TABLET BY MOUTH 3 (THREE) TIMES DAILY. 9AM/1PM/5PM (Patient taking differently: Take 1 tablet by mouth 3 (three) times daily. At 11am at 0.5 tablet)   Cholecalciferol  (VITAMIN D3) 25 MCG (1000 UT) CAPS Take 1 capsule (1,000 Units total) by mouth daily.   clonazePAM  (KLONOPIN ) 0.5 MG tablet TAKE 1 TABLET BY MOUTH EVERYDAY AT BEDTIME   clotrimazole -betamethasone  (LOTRISONE ) cream Apply 1 application topically daily.   Cranberry-Vitamin C  (AZO CRANBERRY URINARY TRACT) 250-60 MG CAPS Take 2 capsules by mouth daily.   cyanocobalamin  (VITAMIN B12) 1000 MCG/ML injection Inject 1 mL (1,000 mcg total) into the muscle every 21 ( twenty-one) days.   fludrocortisone  (FLORINEF ) 0.1 MG tablet Take 1 tablet (100 mcg total) by mouth daily.   Glycerin -Hypromellose-PEG 400 (ARTIFICIAL TEARS) 0.2-0.2-1 % SOLN Place 1 drop into both eyes daily as needed (dry eyes).   Latanoprost  PF 0.005 % SOLN Apply 1 drop to eye daily.   nystatin  cream (MYCOSTATIN ) Apply 1 Application topically 2 (two) times daily.  To affected area/rash   sertraline  (ZOLOFT ) 100 MG tablet Take 1 tablet (100 mg total) by mouth daily.   SYRINGE-NEEDLE, DISP, 3 ML (BD SAFETYGLIDE SYRINGE/NEEDLE) 25G X 1" 3 ML MISC Use to inject vitamin B12 monthly.   traMADol  (ULTRAM ) 50 MG tablet TAKE 1 TABLET (50 MG TOTAL) BY MOUTH EVERY 6 (SIX) HOURS AS NEEDED FOR MODERATE PAIN   zoledronic  acid (RECLAST ) 5 MG/100ML SOLN injection Inject 100 mLs (5 mg total) into the vein. yearly   No facility-administered encounter medications on file as of 04/24/2024.    Objective:   PHYSICAL EXAMINATION:  VITALS:   Vitals:   04/24/24 1323  BP: 110/72  Pulse: 78  SpO2: 98%  Weight: 152 lb 9.6 oz (69.2 kg)    GEN:  The patient appears stated age and is in NAD. HEENT:  Normocephalic, atraumatic.  The mucous membranes are moist. The superficial temporal arteries are without ropiness or tenderness. CV:  RRR Lungs:  CTAB Neck/HEME:  There are no carotid bruits bilaterally.  Neurological examination:  Orientation: The patient is alert and oriented x3. Cranial nerves: There is chronic L facial droop.  Smile is symmetric. The speech is fluent and hypophonic and occasionally dysarthric.  This is her baseline.  Soft palate rises symmetrically and there is no tongue deviation. Hearing is intact to conversational tone. Sensation: Sensation is intact to light touch throughout Motor: Strength is at least antigravity x4.  Movement examination: Tone: There is normal tone in the upper and lower extremities before and after programming Abnormal movements: Prior to programming, there is rare and mild left upper extremity rest tremor. Coordination:  There is no significant decremation today. Gait and Station: Not tested today     Chemistry      Component Value Date/Time   NA 140 04/16/2024 1241   K 3.7 04/16/2024 1241   CL 102 04/16/2024 1241   CO2 31 04/16/2024 1241   BUN 13 04/16/2024 1241   CREATININE 0.70 04/16/2024 1241   CREATININE 0.87  10/08/2019 0921      Component Value Date/Time   CALCIUM  9.4 04/16/2024 1241   ALKPHOS 109 01/10/2024 0911   AST 10 01/10/2024 0911   ALT 1 01/10/2024 0911   BILITOT 0.5 01/10/2024 0911       Lab Results  Component Value Date   WBC 5.4 04/16/2024   HGB 12.1 04/16/2024   HCT 37.1 04/16/2024   MCV 85.6 04/16/2024   PLT 290.0 04/16/2024    Lab Results  Component Value Date   TSH 1.12 01/10/2024    Total time spent on today's visit was 40 minutes, including both face-to-face time and nonface-to-face time.  Time included that spent on review of records (prior notes available to me/labs/imaging if pertinent), discussing treatment and goals, answering patient's questions and coordinating care.  This did not include DBS time.     Cc:  Claire Crick, MD

## 2024-04-24 ENCOUNTER — Ambulatory Visit: Admitting: Neurology

## 2024-04-24 VITALS — BP 110/72 | HR 78 | Wt 152.6 lb

## 2024-04-24 DIAGNOSIS — G20A1 Parkinson's disease without dyskinesia, without mention of fluctuations: Secondary | ICD-10-CM | POA: Diagnosis not present

## 2024-04-24 DIAGNOSIS — R55 Syncope and collapse: Secondary | ICD-10-CM

## 2024-04-24 DIAGNOSIS — Z9689 Presence of other specified functional implants: Secondary | ICD-10-CM

## 2024-04-24 DIAGNOSIS — G903 Multi-system degeneration of the autonomic nervous system: Secondary | ICD-10-CM | POA: Diagnosis not present

## 2024-04-24 NOTE — Procedures (Signed)
 DBS Programming was performed.    Manufacturer of DBS device: [employer], bluetooth  Total time spent programming was 12 minutes.  Device was confirmed to be on.  Soft start was confirmed to be on.  Impedences were checked and were within normal limits.  Battery was checked and was determined to be functioning normally and not near the end of life.  Final settings were as follows:    Active Contact Amplitude (mA) PW (ms) Frequency (hz) Side Effects  Left Brain       08/08/21 3-C+ 1.7 60 130   Other trials        1-C+ 2.7 60 130 tremor   (2/3/4)-C+ 3.0 60 130 Face pull above 2.1   (5/6/7)-C+ 1.8 60 130 ? Face pull (some baseline facial asymmetry)  08/22/21 5-(25%)7-(75%)C+ 2.5 60 159   09/05/21 final 5-(25%)6-(75%)C+ 2.9 60 159   Other trials 5-(50%)6-(50%)C+ 2.9 60 159    5-(25%)7-(75%)C+ 3.5 60 159 Tremor not controlled  10/31/21 5-(25%)7-(75%)C+ 3.3 60 159   05/01/22 5-(25%)7-(75%)C+ 3.6 60 159 Transient lip tingle  08/11/22 5-(25%)7-(75%)C+ 3.7 60 159   09/25/22 6-(25%)7-(75%)C+ 3.7 60 159   03/16/23 3c(25%)3b(75%)C+ 3.7 60 159   08/16/23 3c(25%)3b(75%)C+ 3.6 60 159   02/13/24 3c(25%)3b(75%)C+ 3.6 60 159   04/24/24 3c(25%)3b(75%)C+ 3.7 60 159                 Right Brain       08/08/21 (5/6/7)-C+ 1.6 60 130   Other trials 1-C+ 1.5 60 130 ? Speech change   (2/3/4)-C+ 1.5 60 130 Pretty good   8-C+ 1.6 60 130 tremor  08/22/21 8-C+ 2.7 60 136   09/05/21 final 6-(50%)8-(50%)C+ 2.9 60 159   others 6-(60%)8-(40%)C+ 2.8  60 136 Mouth dyskinesia  10/31/21 6-(50%)8-(50%)C+ 3.4 60 159   05/01/22 6-(50%)8-(50%)C+ 3.6 60 159   08/11/22 6-(50%)8-(50%)C+ 3.5 60 159   09/25/22 6-(50%)8-(50%)C+ 3.5 60 159   03/16/23 3b(50%)4(50%)C+ 3.5 60 159   08/16/23 3b(50%)4(50%)C+ 3.5 60 159   02/13/24 3b(50%)4(50%)C+ 3.5 60 159   04/24/24 3b(50%)4(50%)C+ 3.5 60 159

## 2024-04-29 ENCOUNTER — Telehealth: Payer: Self-pay

## 2024-04-29 DIAGNOSIS — G20A1 Parkinson's disease without dyskinesia, without mention of fluctuations: Secondary | ICD-10-CM

## 2024-04-29 NOTE — Progress Notes (Signed)
 Complex Care Management Note Care Guide Note  04/29/2024 Name: PHYLISS HULICK MRN: [id number] DOB: June 18, 1946   Complex Care Management Outreach Attempts: An unsuccessful telephone outreach was attempted today to offer the patient information about available complex care management services.  Follow Up Plan:  Additional outreach attempts will be made to offer the patient complex care management information and services.   Encounter Outcome:  No Answer  [facility]  [facility], [facility] Health Care Management Assistant Direct Dial: [phone]  Fax: [phone]

## 2024-05-02 NOTE — Progress Notes (Signed)
 Complex Care Management Note  Care Guide Note 05/02/2024 Name: [name] MRN: [id number] DOB: 16-Nov-1946  [name] is a 78 y.o. year old female who sees Claire Crick, MD for primary care. I reached out to Sharlotte Dean by phone today to offer complex care management services.  [name] was given information about Complex Care Management services today including:   The Complex Care Management services include support from the care team which includes your Nurse Care Manager, Clinical Social Worker, or Pharmacist.  The Complex Care Management team is here to help remove barriers to the health concerns and goals most important to you. Complex Care Management services are voluntary, and the patient may decline or stop services at any time by request to their care team member.   Complex Care Management Consent Status: Patient did not agree to participate in complex care management services at this time.  Follow up plan:  The care guide will reach out to the patient again over the next 7 days.  Encounter Outcome:  Patient Request to Call Back  [facility]  [facility], [facility] Health Care Management Assistant Direct Dial: [phone]  Fax: [phone]

## 2024-05-06 NOTE — Progress Notes (Signed)
 Complex Care Management Note Care Guide Note  05/06/2024 Name: [name] MRN: [id number] DOB: 20-Aug-1946   Complex Care Management Outreach Attempts: A third unsuccessful outreach was attempted today to offer the patient with information about available complex care management services.  Follow Up Plan:  No further outreach attempts will be made at this time. We have been unable to contact the patient to offer or enroll patient in complex care management services.  Encounter Outcome:  No Answer  [facility]  [facility], [facility] Health Care Management Assistant Direct Dial: [phone]  Fax: [phone]

## 2024-05-10 ENCOUNTER — Other Ambulatory Visit: Payer: Self-pay | Admitting: Neurology

## 2024-05-10 DIAGNOSIS — G20A1 Parkinson's disease without dyskinesia, without mention of fluctuations: Secondary | ICD-10-CM

## 2024-05-20 ENCOUNTER — Other Ambulatory Visit: Payer: Self-pay | Admitting: Family Medicine

## 2024-05-20 ENCOUNTER — Encounter: Payer: Self-pay | Admitting: Family Medicine

## 2024-05-20 DIAGNOSIS — F321 Major depressive disorder, single episode, moderate: Secondary | ICD-10-CM

## 2024-05-20 NOTE — Telephone Encounter (Signed)
 ERx

## 2024-05-23 MED ORDER — FLUDROCORTISONE ACETATE 0.1 MG PO TABS: 0.2000 mg | ORAL_TABLET | Freq: Every day | ORAL | 6 refills | Status: DC

## 2024-05-28 ENCOUNTER — Ambulatory Visit: Attending: Cardiology | Admitting: Cardiology

## 2024-05-28 ENCOUNTER — Encounter: Payer: Self-pay | Admitting: Cardiology

## 2024-05-28 VITALS — BP 140/70 | HR 80 | Ht 64.5 in | Wt 151.0 lb

## 2024-05-28 DIAGNOSIS — G20A1 Parkinson's disease without dyskinesia, without mention of fluctuations: Secondary | ICD-10-CM | POA: Diagnosis not present

## 2024-05-28 DIAGNOSIS — F0670 Mild neurocognitive disorder due to known physiological condition without behavioral disturbance: Secondary | ICD-10-CM | POA: Diagnosis not present

## 2024-05-28 DIAGNOSIS — Z8679 Personal history of other diseases of the circulatory system: Secondary | ICD-10-CM | POA: Diagnosis not present

## 2024-05-28 DIAGNOSIS — R55 Syncope and collapse: Secondary | ICD-10-CM | POA: Diagnosis not present

## 2024-05-28 NOTE — Patient Instructions (Signed)
 Medication Instructions:  Your physician recommends that you continue on your current medications as directed. Please refer to the Current Medication list given to you today.  *If you need a refill on your cardiac medications before your next appointment, please call your pharmacy*  Lab Work: None If you have labs (blood work) drawn today and your tests are completely normal, you will receive your results only by: MyChart Message (if you have MyChart) OR A paper copy in the mail If you have any lab test that is abnormal or we need to change your treatment, we will call you to review the results.  Testing/Procedures: Your physician has requested that you have an echocardiogram. Echocardiography is a painless test that uses sound waves to create images of your heart. It provides your doctor with information about the size and shape of your heart and how well your heart's chambers and valves are working. This procedure takes approximately one hour. There are no restrictions for this procedure. Please do NOT wear cologne, perfume, aftershave, or lotions (deodorant is allowed). Please arrive 15 minutes prior to your appointment time.  Please note: We ask at that you not bring children with you during ultrasound (echo/ vascular) testing. Due to room size and safety concerns, children are not allowed in the ultrasound rooms during exams. Our front office staff cannot provide observation of children in our lobby area while testing is being conducted. An adult accompanying a patient to their appointment will only be allowed in the ultrasound room at the discretion of the ultrasound technician under special circumstances. We apologize for any inconvenience.   Follow-Up: At [facility], you and your health needs are our priority.  As part of our continuing mission to provide you with exceptional heart care, our providers are all part of one team.  This team includes your primary Cardiologist  (physician) and Advanced Practice Providers or APPs (Physician Assistants and Nurse Practitioners) who all work together to provide you with the care you need, when you need it.  Your next appointment:   9 month(s)  Provider:   Belva Crome, MD    We recommend signing up for the patient portal called "MyChart".  Sign up information is provided on this After Visit Summary.  MyChart is used to connect with patients for Virtual Visits (Telemedicine).  Patients are able to view lab/test results, encounter notes, upcoming appointments, etc.  Non-urgent messages can be sent to your provider as well.   To learn more about what you can do with MyChart, go to [email or web address].   Other Instructions None

## 2024-05-28 NOTE — Progress Notes (Signed)
 Cardiology Office Note:    Date:  05/28/2024   ID:  [name], DOB 10-Mar-1946, MRN [id number]  PCP:  Rilla Baller, MD  Cardiologist:  Jennifer JONELLE Crape, MD   Referring MD: Rilla Baller, MD    ASSESSMENT:    1. 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 Syncope and collapse   3. History of cardiomyopathy    PLAN:    In order of problems listed above:  Primary prevention stressed with the patient.  Importance of compliance with diet medication stressed and patient verbalized standing. History of syncope and hypotension: Unclear etiology.  She has been monitored for this in the past and the event monitoring has been unyielding therefore I have discussed with her about long-term monitoring with Linq and she is agreeable.  Will set her up for an appointment with our electrophysiology colleagues for the same. History of cardiomyopathy: We will do an echocardiogram to assess this.  Her last echocardiogram revealed preserved ejection fraction. Patient will be seen in follow-up appointment in 6 months or earlier if the patient has any concerns.    Medication Adjustments/Labs and Tests Ordered: Current medicines are reviewed at length with the patient today.  Concerns regarding medicines are outlined above.  No orders of the defined types were placed in this encounter.  No orders of the defined types were placed in this encounter.    Chief Complaint  Patient presents with   Follow-up     History of Present Illness:    [name] is a 78 y.o. female.  Patient has past medical history of syncope and Takotsubo cardiomyopathy.  Ejection fraction completely reversed with normal EF on last evaluations.  She denies any problems at this time.  She has Parkinson's which is significant and has had interventions surgically done to help her Parkinson's.  She has had history of multiple syncopal episodes and is referred back here for another syncopal  episode.  Monitoring in the past has been unremarkable.  A follow-up  Past Medical History:  Diagnosis Date   Advanced care planning/counseling discussion 05/26/2015   Advanced directive - scanned 07/2021 - wants all children involved in her medical decision, starting with son [name]. Doesn't want prolonged life support if terminal condition however ok with tube feeding.     Allergy    Anemia 09/25/2007   With mild leukopenia, on iron and b12 replacement.  Consider SPEP     Anxiety    Arthritis    Blood transfusion without reported diagnosis    CAD (coronary artery disease) 01/2010   MI, Nishan   Cataract    CHF (congestive heart failure) (HCC)    with episode of sepsis   Chronic cough 07/16/2017   Increased gabapentin  to 100 tid from 100 qhs 09/04/2017  Plus max gerd rx   -  MBS 09/11/2017  Ok, rec barium swallow  - DgEs 09/21/2017 >>> Occasional secondary and tertiary contractions in the distal half of the esophagus, but primary peristaltic waves in the esophagus were essentially intact. No appreciable esophageal stricture or ulceration. In borderline slow but successful progress of the    Chronic insomnia 03/07/2016   Chronic, continuous use of opioids 12/07/2013   Closed fracture of rib of right side with routine healing 05/28/2021   Complication of anesthesia    Constipation 08/06/2018   DDD (degenerative disc disease), lumbar 05/31/2013   Lspine 2011 - mild DDD  S/p laminectomy and foraminotomy early 2021 [facility])  S/p  lumbar laminectomy 04/2022 (Dr Dawley)      Degenerative lumbar spinal stenosis 11/27/2019   Depression    not recently   Dysphagia 10/09/2017   Dysuria 09/02/2013   Encounter for general adult medical examination with abnormal findings 05/21/2014   Family circumstance 02/23/2015   Fibromyalgia    GAD (generalized anxiety disorder) 04/28/2016   GERD 02/22/2010   Glaucoma 02/2013   Headache    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   History of  colon polyps 2004   HLD (hyperlipidemia) 05/20/2013   Hypercalcemia 06/09/2023   Hypertension 05/20/2023   HYPOTENSION, ORTHOSTATIC 12/06/2008   Interstitial cystitis    Ottelin now Dr Janit   Knee pain, bilateral 08/06/2018   Localized swelling of chest wall 12/23/2021   Lumbar radiculopathy, chronic 05/16/2022   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/08/2010   MDD (major depressive disorder), single episode, moderate (HCC) 11/12/2013   Medicare annual wellness visit, subsequent 05/20/2013   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   Neck pain 07/11/2023   Osteoporosis 11/10/2009   Other fatigue 11/25/2021   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   Dr Zaldivar plastic surgery     Paraparesis (HCC) 07/25/2023   Parkinson's disease (HCC) 08/25/2015   Dx Dr [name] 07/2015    Pneumonia    PONV (postoperative nausea and vomiting)    Pre-op evaluation 04/26/2022   Rash and other nonspecific skin eruption 05/11/2010   Annotation: thought to be autoimmune---responsive to cellcept   Has had eval at two tertiary centers  Rash is responsive to cellcept      Recurrent UTI 01/03/2011   UTI history:  12/2022 - pansensitive E coli treated with kefiex 7d course  01/2023 - pansensitive E coli treated with keflex  3d course   04/2023 - abnormal UA/micro at [facility] treated with keflex  5d course, UCx not sent     Right leg weakness 07/25/2023   Right lumbar radiculopathy 04/10/2018   MRI Lumbar spine 10/2018:   Multilevel DDD and facet degeneration with some resultant spinal and foraminal stenosis most prominent L5/S1 with R protrusion impinging on L5 nerve root, shallow central protrusion.   Saw [facility]) referred to Dr Bonner PMR 11/2018  LESI x3 with mild benefit 12/2018  S/p laminectomy/foraminotomy 2021 [facility])  S/p lumbar laminectomy 04/2022 (Dr Dawley)      Syncope 01/03/2011   Zio patch 05/2023:  HR 58 - 171 bpm, average 78 bpm.  7  nonsustained SVT, longest 6 beats.  Rare supraventricular and ventricular ectopy.  No sustained arrhythmias.  No atrial fibrillation.     Takotsubo cardiomyopathy 2008   due to E coli urosepsis   Urinary incontinence 01/12/2023   Valvular heart disease 07/11/2023   Echocardiogram 05/21/2023 at [facility]  LVEF 55-60%, normal wall motion, impaired relaxation pattern consistent with age related change, mild MR, tr TR, mild aortic sclerosis without stenosis, mild-mod AR, fair quality      Visual changes 05/20/2023   Vitamin B12 deficiency 05/03/2007   IF negative (02/2017)     Vitamin D  deficiency 05/09/2013    Past Surgical History:  Procedure Laterality Date   ABDOMINAL HYSTERECTOMY  1970s   IUD infection - first partial then with oophorectomy (cysts), complication - low blood pressure   CATARACT EXTRACTION Bilateral    CHOLECYSTECTOMY     complication - low blood pressure   COLONOSCOPY  06/2008   h/o polyps but latest WNL, rec rpt 10 yrs Priscilla)   COLONOSCOPY  11/2018   multiple TAs (10 polyps total), rpt 2 yrs Oma)   COLONOSCOPY  04/2021   multiple TAs, rpt 3 yrs Oma)   CYSTOSCOPY  12/2013   abx treatment for recurrent cystitis   DEXA  04/2013   T -2.9 @ femur, -1.6 @ spine   DEXA  04/2017   T -2.9 hip, -0.7 spine   ESOPHAGOGASTRODUODENOSCOPY  12/2017   WNL, regardless esophagus dilated, small HH Oma)   EYE SURGERY     LUMBAR LAMINECTOMY/DECOMPRESSION MICRODISCECTOMY Right 11/27/2019   Right Lumbar Four-Five foraminotomy;  [facility] Pac, MD)   LUMBAR LAMINECTOMY/DECOMPRESSION MICRODISCECTOMY Right 05/16/2022   Procedure: OPEN LUMBAR LAMINECTOMY, RT, L45 W/LATERAL RECESS DECOMPRESSION;  Surgeon: Carollee Lani BROCKS, DO;  Location: [facility] OR;  Service: Neurosurgery;  Laterality: Right;  3C   MINOR PLACEMENT OF FIDUCIAL N/A 06/30/2021   Procedure: MINOR PLACEMENT OF FIDUCIAL;  Surgeon: Unice Pac, MD;  Location: [facility] OR;  Service: Neurosurgery;  Laterality: N/A;  Minor room   PTOSIS  REPAIR Bilateral 10/2020   Plastic Surgery   PULSE GENERATOR IMPLANT Right 07/14/2021   Procedure: UNILATERAL PULSE GENERATOR IMPLANT;  Surgeon: Unice Pac, MD;  Location[facility] OR;  Service: Neurosurgery;  Laterality: Right;   SUBTHALAMIC STIMULATOR INSERTION Bilateral 07/07/2021   Procedure: Deep brain stimulator placement;  Surgeon: Unice Pac, MD;  Location: [facility] OR;  Service: Neurosurgery;  Laterality: Bilateral;   TUBAL LIGATION     UPPER GASTROINTESTINAL ENDOSCOPY      Current Medications: Current Meds  Medication Sig   acetaminophen  (TYLENOL ) 500 MG tablet Take 2 tablets (1,000 mg total) by mouth in the morning, at noon, and at bedtime.   AMBULATORY NON FORMULARY MEDICATION Lift chair Dx:  G20   carbidopa -levodopa  (SINEMET  CR) 50-200 MG tablet TAKE 1 TABLET BY MOUTH EVERYDAY AT BEDTIME   carbidopa -levodopa  (SINEMET  IR) 25-100 MG tablet TAKE 1 TABLET BY MOUTH 3 (THREE) TIMES DAILY. 9AM/1PM/5PM   Cholecalciferol  (VITAMIN D3) 25 MCG (1000 UT) CAPS Take 1 capsule (1,000 Units total) by mouth daily.   clonazePAM  (KLONOPIN ) 0.5 MG tablet TAKE 1 TABLET BY MOUTH EVERYDAY AT BEDTIME   clotrimazole -betamethasone  (LOTRISONE ) cream Apply 1 application topically daily.   Cranberry-Vitamin C  (AZO CRANBERRY URINARY TRACT) 250-60 MG CAPS Take 2 capsules by mouth daily.   cyanocobalamin  (VITAMIN B12) 1000 MCG/ML injection Inject 1 mL (1,000 mcg total) into the muscle every 21 ( twenty-one) days.   fludrocortisone  (FLORINEF ) 0.1 MG tablet Take 2 tablets (0.2 mg total) by mouth daily.   Glycerin -Hypromellose-PEG 400 (ARTIFICIAL TEARS) 0.2-0.2-1 % SOLN Place 1 drop into both eyes daily as needed (dry eyes).   Latanoprost  PF 0.005 % SOLN Apply 1 drop to eye daily.   nystatin  cream (MYCOSTATIN ) Apply 1 Application topically 2 (two) times daily. To affected area/rash   sertraline  (ZOLOFT ) 100 MG tablet Take 1 tablet (100 mg total) by mouth daily.   SYRINGE-NEEDLE, DISP, 3 ML (BD SAFETYGLIDE  SYRINGE/NEEDLE) 25G X 1 3 ML MISC Use to inject vitamin B12 monthly.   traMADol  (ULTRAM ) 50 MG tablet TAKE 1 TABLET (50 MG TOTAL) BY MOUTH EVERY 6 (SIX) HOURS AS NEEDED FOR MODERATE PAIN   zoledronic  acid (RECLAST ) 5 MG/100ML SOLN injection Inject 100 mLs (5 mg total) into the vein. yearly     Allergies:   Iohexol, Amitriptyline, Ciprofloxacin , Cymbalta  [duloxetine  hcl], Lyrica  [pregabalin ], Imipramine hcl, Iodine, Lidocaine  hcl, Morphine sulfate, Neosporin [neomycin-bacitracin  zn-polymyx], Sulfamethoxazole, and Tetracyclines & related   Social History   Socioeconomic History   Marital status:  Widowed    Spouse name: Not on file   Number of children: 4   Years of education: Not on file   Highest education level: 10th grade  Occupational History   Occupation: retired  Tobacco Use   Smoking status: Never   Smokeless tobacco: Never  Vaping Use   Vaping status: Never Used  Substance and Sexual Activity   Alcohol  use: No   Drug use: Not Currently   Sexual activity: Never  Other Topics Concern   Not on file  Social History Narrative   Widow - husband Beverlie) passed away 08-Jun-2013.     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, theatre/television/film   Activity: limited by back pain   Right Handed   Social Drivers of Health   Financial Resource Strain: Low Risk  (01/11/2024)   Overall Financial Resource Strain (CARDIA)    Difficulty of Paying Living Expenses: Not very hard  Food Insecurity: No Food Insecurity (04/03/2024)   Hunger Vital Sign    Worried About Running Out of Food in the Last Year: Never true    Ran Out of Food in the Last Year: Never true  Transportation Needs: No Transportation Needs (04/03/2024)   PRAPARE - Administrator, Civil Service (Medical): No    Lack of Transportation (Non-Medical): No  Physical Activity: Unknown (01/11/2024)   Exercise Vital Sign     Days of Exercise per Week: Patient declined    Minutes of Exercise per Session: Not on file  Stress: No Stress Concern Present (03/31/2024)   Received from [facility] - Occupational Stress Questionnaire    Feeling of Stress : Not at all  Social Connections: Moderately Integrated (01/11/2024)   Social Connection and Isolation Panel    Frequency of Communication with Friends and Family: More than three times a week    Frequency of Social Gatherings with Friends and Family: Once a week    Attends Religious Services: More than 4 times per year    Active Member of [employer] or Organizations: Yes    Attends Banker Meetings: More than 4 times per year    Marital Status: Widowed     Family History: The patient's family history includes Breast cancer in her sister; CAD in her brother; Cancer in her mother; Clotting disorder in her son; Diabetes in her father; Esophageal cancer in her mother; Healthy in her daughter, daughter, and son; Heart attack in her father; Lung cancer in her brother and brother; Ovarian cancer in her sister; Prostate cancer in her father; Uterine cancer in her sister. There is no history of Colon cancer, Rectal cancer, or Stomach cancer.  ROS:   Please see the history of present illness.    All other systems reviewed and are negative.  EKGs/Labs/Other Studies Reviewed:    The following studies were reviewed today: I discussed my findings with the patient at length today.   Recent Labs: 01/10/2024: ALT 1; TSH 1.12 04/16/2024: BUN 13; Creatinine, Ser 0.70; Hemoglobin 12.1; Magnesium  1.9; Platelets 290.0; Potassium 3.7; Sodium 140  Recent Lipid Panel    Component Value Date/Time   CHOL 203 (H) 01/10/2024 0911   TRIG 134.0 01/10/2024 0911   HDL 55.90 01/10/2024 0911   CHOLHDL 4 01/10/2024 0911   VLDL 26.8 01/10/2024 0911   LDLCALC 120 (H) 01/10/2024 0911  LDLDIRECT 105.5 07/20/2010 1146    Physical Exam:     VS:  BP (!) 140/70   Pulse 80   Ht 5' 4.5 (1.638 m)   Wt 151 lb (68.5 kg)   SpO2 97%   BMI 25.52 kg/m     Wt Readings from Last 3 Encounters:  05/28/24 151 lb (68.5 kg)  04/24/24 152 lb 9.6 oz (69.2 kg)  04/16/24 152 lb 4 oz (69.1 kg)     GEN: Patient is in no acute distress HEENT: Normal NECK: No JVD; No carotid bruits LYMPHATICS: No lymphadenopathy CARDIAC: Hear sounds regular, 2/6 systolic murmur at the apex. RESPIRATORY:  Clear to auscultation without rales, wheezing or rhonchi  ABDOMEN: Soft, non-tender, non-distended MUSCULOSKELETAL:  No edema; No deformity  SKIN: Warm and dry NEUROLOGIC:  Alert and oriented x 3 PSYCHIATRIC:  Normal affect   Signed, Jennifer JONELLE Crape, MD  05/28/2024 1:22 PM    [facility]

## 2024-06-09 ENCOUNTER — Ambulatory Visit: Payer: Self-pay

## 2024-06-09 DIAGNOSIS — R300 Dysuria: Secondary | ICD-10-CM | POA: Diagnosis not present

## 2024-06-09 DIAGNOSIS — R350 Frequency of micturition: Secondary | ICD-10-CM | POA: Diagnosis not present

## 2024-06-09 DIAGNOSIS — N3000 Acute cystitis without hematuria: Secondary | ICD-10-CM | POA: Diagnosis not present

## 2024-06-09 DIAGNOSIS — R1030 Lower abdominal pain, unspecified: Secondary | ICD-10-CM | POA: Diagnosis not present

## 2024-06-09 NOTE — Telephone Encounter (Unsigned)
 Copied from CRM [id number]. Topic: General - Call Back - No Documentation >> Jun 09, 2024  3:55 PM Charlet HERO wrote: Reason for CRM:  Patient is calling about a message that was left by Olam to call back for his mother . Infomred she is with patient and will call back.

## 2024-06-09 NOTE — Telephone Encounter (Signed)
 Lvm asking pt to call back. Pt needs OV for dysuria and urinary frequency.

## 2024-06-09 NOTE — Telephone Encounter (Signed)
 Please schedule 4:30pm office visit appt today with me.

## 2024-06-09 NOTE — Telephone Encounter (Signed)
 Lvm with son, Glendia (on dpr) asking him to have pt call our office asap. Need offer appt today per Dr KANDICE (see message below).

## 2024-06-09 NOTE — Telephone Encounter (Signed)
 FYI Only or Action Required?: FYI only for provider.  Patient was last seen in primary care on 04/16/2024 by Rilla Baller, MD.  Called Nurse Triage reporting Dysuria.  Symptoms began a week ago.  Interventions attempted: Rest, hydration, or home remedies.  Symptoms are: gradually worsening.  Triage Disposition: See Physician Within 24 Hours  Patient/caregiver understands and will follow disposition?: Yes       Copied from CRM [id number]. Topic: Clinical - Red Word Triage >> Jun 09, 2024  1:29 PM [place] J wrote: [facility] that prompted transfer to Nurse Triage: Patient believes she has a UTI. She's experiencing burning and frequent urination.  ----------------------------------------------------------------------- From previous Reason for Contact - Scheduling: Patient/patient representative is calling to schedule an appointment. Refer to attachments for appointment information. Reason for Disposition  Age > 50 years  Answer Assessment - Initial Assessment Questions 1. SEVERITY: How bad is the pain?  (e.g., Scale 1-10; mild, moderate, or severe)     mild 2. FREQUENCY: How many times have you had painful urination today?      every 3. PATTERN: Is pain present every time you urinate or just sometimes?      no 4. ONSET: When did the painful urination start?      Wednesday 5. FEVER: Do you have a fever? If Yes, ask: What is your temperature, how was it measured, and when did it start?     no 6. PAST UTI: Have you had a urine infection before? If Yes, ask: When was the last time? and What happened that time?      Yes, ended up in hospital  7. CAUSE: What do you think is causing the painful urination?  (e.g., UTI, scratch, Herpes sore)     uti 8. OTHER SYMPTOMS: Do you have any other symptoms? (e.g., blood in urine, flank pain, genital sores, urgency, vaginal discharge)     Frequency, abd pressure  Protocols used: Urination Pain - Female-A-AH

## 2024-06-09 NOTE — Telephone Encounter (Signed)
 Attempted to contact pt. No answer, vm box full. Need to offer OV today at 4:30 for dysuria, per Dr KANDICE.  I will also send a MyChart msg.

## 2024-06-15 ENCOUNTER — Encounter: Payer: Self-pay | Admitting: Neurology

## 2024-06-17 ENCOUNTER — Ambulatory Visit (INDEPENDENT_AMBULATORY_CARE_PROVIDER_SITE_OTHER): Admitting: Family Medicine

## 2024-06-17 ENCOUNTER — Encounter: Payer: Self-pay | Admitting: Family Medicine

## 2024-06-17 VITALS — BP 138/68 | HR 80 | Temp 98.1°F | Ht 64.5 in | Wt 150.4 lb

## 2024-06-17 DIAGNOSIS — R300 Dysuria: Secondary | ICD-10-CM | POA: Diagnosis not present

## 2024-06-17 DIAGNOSIS — N390 Urinary tract infection, site not specified: Secondary | ICD-10-CM | POA: Diagnosis not present

## 2024-06-17 DIAGNOSIS — N3946 Mixed incontinence: Secondary | ICD-10-CM

## 2024-06-17 LAB — POC URINALSYSI DIPSTICK (AUTOMATED)
Bilirubin, UA: NEGATIVE
Blood, UA: NEGATIVE
Glucose, UA: NEGATIVE
Ketones, UA: NEGATIVE
Nitrite, UA: NEGATIVE
Protein, UA: NEGATIVE
Spec Grav, UA: 1.01 (ref 1.010–1.025)
Urobilinogen, UA: 0.2 U/dL
pH, UA: 6 (ref 5.0–8.0)

## 2024-06-17 MED ORDER — CEFDINIR 300 MG PO CAPS: 300.0000 mg | ORAL_CAPSULE | Freq: Two times a day (BID) | ORAL | 0 refills | Status: DC

## 2024-06-17 NOTE — Progress Notes (Unsigned)
 Ph: [phone] Fax: [phone]   Patient ID: [name], female    DOB: 09/19/1946, 78 y.o.   MRN: [id number]  This visit was conducted in person.  BP 138/68   Pulse 80   Temp 98.1 F (36.7 C) (Oral)   Ht 5' 4.5 (1.638 m)   Wt 150 lb 6 oz (68.2 kg)   SpO2 100%   BMI 25.41 kg/m    CC: UCC f/u for UTI  Subjective:   HPI: [name] is a 78 y.o. female presenting on 06/17/2024 for Urinary Tract Infection (Pt is accompanied by her son Glendia /Pt states she is still having pressure and frequency.)   Seen last week 06/09/2024 at [facility] treated with keflex  500mg  BID 7d course.  Urine culture was not sent.   She completed course yesterday, notes ongoing suprapubic pressure and urinary frequency and fatigue.  No further dysuria, fevers/chills, nausea, lower back pain, hematuria.   Last UCx 03/2024 - <10k single GNR.  Prior hospitalization 03/2024 at [facility]  *** treated with omnicef .   She agrees to urology referral - to [facility]. -Notes ongoing symptoms of mixed incontinence. She previously saw Dr Ottelin at [facility] urology, last 12/2016. Also saw Dr Janit in [place] for interstitial cystitis.      Relevant past medical, surgical, family and social history reviewed and updated as indicated. Interim medical history since our last visit reviewed. Allergies and medications reviewed and updated. Outpatient Medications Prior to Visit  Medication Sig Dispense Refill  . acetaminophen  (TYLENOL ) 500 MG tablet Take 2 tablets (1,000 mg total) by mouth in the morning, at noon, and at bedtime.    . AMBULATORY NON FORMULARY MEDICATION Lift chair Dx:  G20 1 Device 0  . carbidopa -levodopa  (SINEMET  CR) 50-200 MG tablet TAKE 1 TABLET BY MOUTH EVERYDAY AT BEDTIME 90 tablet 0  . carbidopa -levodopa  (SINEMET  IR) 25-100 MG tablet TAKE 1 TABLET BY MOUTH 3 (THREE) TIMES DAILY. 9AM/1PM/5PM 270 tablet 0  . Cholecalciferol  (VITAMIN D3) 25 MCG (1000 UT) CAPS Take 1 capsule (1,000 Units total) by  mouth daily. 30 capsule   . clonazePAM  (KLONOPIN ) 0.5 MG tablet TAKE 1 TABLET BY MOUTH EVERYDAY AT BEDTIME 30 tablet 0  . clotrimazole -betamethasone  (LOTRISONE ) cream Apply 1 application topically daily. 30 g 0  . Cranberry-Vitamin C  (AZO CRANBERRY URINARY TRACT) 250-60 MG CAPS Take 2 capsules by mouth daily.    . cyanocobalamin  (VITAMIN B12) 1000 MCG/ML injection Inject 1 mL (1,000 mcg total) into the muscle every 21 ( twenty-one) days. 5 mL 3  . fludrocortisone  (FLORINEF ) 0.1 MG tablet Take 2 tablets (0.2 mg total) by mouth daily. 60 tablet 6  . Glycerin -Hypromellose-PEG 400 (ARTIFICIAL TEARS) 0.2-0.2-1 % SOLN Place 1 drop into both eyes daily as needed (dry eyes).    . Latanoprost  PF 0.005 % SOLN Apply 1 drop to eye daily.    . nystatin  cream (MYCOSTATIN ) Apply 1 Application topically 2 (two) times daily. To affected area/rash 30 g 0  . sertraline  (ZOLOFT ) 100 MG tablet Take 1 tablet (100 mg total) by mouth daily. 100 tablet 3  . SYRINGE-NEEDLE, DISP, 3 ML (BD SAFETYGLIDE SYRINGE/NEEDLE) 25G X 1 3 ML MISC Use to inject vitamin B12 monthly. 50 each 0  . traMADol  (ULTRAM ) 50 MG tablet TAKE 1 TABLET (50 MG TOTAL) BY MOUTH EVERY 6 (SIX) HOURS AS NEEDED FOR MODERATE PAIN 30 tablet 3  . zoledronic  acid (RECLAST ) 5 MG/100ML SOLN injection Inject 100 mLs (5 mg total) into the vein. yearly  No facility-administered medications prior to visit.     Per HPI unless specifically indicated in ROS section below Review of Systems  Objective:  BP 138/68   Pulse 80   Temp 98.1 F (36.7 C) (Oral)   Ht 5' 4.5 (1.638 m)   Wt 150 lb 6 oz (68.2 kg)   SpO2 100%   BMI 25.41 kg/m   Wt Readings from Last 3 Encounters:  06/17/24 150 lb 6 oz (68.2 kg)  05/28/24 151 lb (68.5 kg)  04/24/24 152 lb 9.6 oz (69.2 kg)      Physical Exam Vitals and nursing note reviewed.  Constitutional:      Appearance: Normal appearance. She is not ill-appearing.  HENT:     Mouth/Throat:     Mouth: Mucous membranes are  moist.     Pharynx: Oropharynx is clear. No oropharyngeal exudate or posterior oropharyngeal erythema.  Eyes:     Extraocular Movements: Extraocular movements intact.     Pupils: Pupils are equal, round, and reactive to light.  Cardiovascular:     Rate and Rhythm: Normal rate and regular rhythm.     Pulses: Normal pulses.     Heart sounds: Normal heart sounds. No murmur heard. Pulmonary:     Effort: Pulmonary effort is normal. No respiratory distress.     Breath sounds: Normal breath sounds. No wheezing, rhonchi or rales.  Abdominal:     General: Bowel sounds are normal. There is no distension.     Palpations: Abdomen is soft. There is no mass.     Tenderness: There is abdominal tenderness (mild-mod) in the epigastric area and suprapubic area. There is right CVA tenderness (mild) and left CVA tenderness (mild). There is no guarding or rebound.     Hernia: No hernia is present.  Musculoskeletal:     Right lower leg: No edema.     Left lower leg: No edema.  Skin:    General: Skin is warm and dry.     Findings: No rash.  Neurological:     Mental Status: She is alert.  Psychiatric:        Mood and Affect: Mood normal.        Behavior: Behavior normal.       Results for orders placed or performed in visit on 06/17/24  POCT Urinalysis Dipstick (Automated)   Collection Time: 06/17/24  4:37 PM  Result Value Ref Range   Color, UA yellow    Clarity, UA cloudy    Glucose, UA Negative Negative   Bilirubin, UA neg    Ketones, UA neg    Spec Grav, UA 1.010 1.010 - 1.025   Blood, UA neg    pH, UA 6.0 5.0 - 8.0   Protein, UA Negative Negative   Urobilinogen, UA 0.2 0.2 or 1.0 E.U./dL   Nitrite, UA neg    Leukocytes, UA Large (3+) (A) Negative   *Note: Due to a large number of results and/or encounters for the requested time period, some results have not been displayed. A complete set of results can be found in Results Review.   Lab Results  Component Value Date   VITAMINB12 385  02/13/2024    Assessment & Plan:   Problem List Items Addressed This Visit   None Visit Diagnoses       Dysuria    -  Primary   Relevant Orders   POCT Urinalysis Dipstick (Automated) (Completed)   Urine Culture        Meds ordered this  encounter  Medications  . cefdinir  (OMNICEF ) 300 MG capsule    Sig: Take 1 capsule (300 mg total) by mouth 2 (two) times daily for 7 days.    Dispense:  14 capsule    Refill:  0    Orders Placed This Encounter  Procedures  . Urine Culture  . POCT Urinalysis Dipstick (Automated)    Patient Instructions  Possible persistent UTI - start omnicef  300mg  twice daily for 1 wk I have sent urine culture to ensure correct antibiotic chosen.  Let us  know if ongoing symptoms I will also place referral to [facility] for further evaluation of recurrent UTI and mixed incontinence.   Follow up plan: Return if symptoms worsen or fail to improve.  Anton Blas, MD

## 2024-06-17 NOTE — Patient Instructions (Addendum)
 Possible persistent UTI - start omnicef  300mg  twice daily for 1 wk I have sent urine culture to ensure correct antibiotic chosen.  Let us  know if ongoing symptoms I will also place referral to [facility] for further evaluation of recurrent UTI and mixed incontinence.

## 2024-06-18 ENCOUNTER — Other Ambulatory Visit: Payer: Self-pay | Admitting: Family Medicine

## 2024-06-18 DIAGNOSIS — F321 Major depressive disorder, single episode, moderate: Secondary | ICD-10-CM

## 2024-06-18 DIAGNOSIS — M51369 Other intervertebral disc degeneration, lumbar region without mention of lumbar back pain or lower extremity pain: Secondary | ICD-10-CM

## 2024-06-18 NOTE — Assessment & Plan Note (Signed)
 Avoid antimuscarinics due to cognitive effects.  Avoid alpha blockers due to orthostasis Myrbetriq  was unaffordable.  Refer back to urology.

## 2024-06-18 NOTE — Assessment & Plan Note (Signed)
 Latest UTI treated at [facility] - urinalysis at that time showing small LE, neg nitrites, no blood, treated with keflex  course. UCx results not available.  Repeat UA today abnormal however microscopy suspicious for contamination.  Will send culture and restart omnicef  course.  Given h/o recurrent UTIs and mixed incontinence, will refer back to [facility] (last saw Ottelin 2018).

## 2024-06-19 ENCOUNTER — Encounter: Payer: Self-pay | Admitting: Family Medicine

## 2024-06-19 DIAGNOSIS — F321 Major depressive disorder, single episode, moderate: Secondary | ICD-10-CM

## 2024-06-19 DIAGNOSIS — M51369 Other intervertebral disc degeneration, lumbar region without mention of lumbar back pain or lower extremity pain: Secondary | ICD-10-CM

## 2024-06-19 LAB — URINE CULTURE
MICRO NUMBER:: 16729932
SPECIMEN QUALITY:: ADEQUATE

## 2024-06-19 NOTE — Telephone Encounter (Signed)
 Last office visit 06/17/24 for Recurrent UTI/Dysuria/Incontinence.  Last refilled Tramadol  02/15/2024 for #30 with 3 refills.  Clonazepam  05/20/2024 for #30 with no refills.  Next Appt: 07/15/24 for 6 month follow up.

## 2024-06-20 ENCOUNTER — Ambulatory Visit: Payer: Self-pay | Admitting: Family Medicine

## 2024-06-20 MED ORDER — CLONAZEPAM 0.5 MG PO TABS: 0.5000 mg | ORAL_TABLET | Freq: Every day | ORAL | 1 refills | Status: DC

## 2024-06-20 MED ORDER — TRAMADOL HCL 50 MG PO TABS: 50.0000 mg | ORAL_TABLET | Freq: Four times a day (QID) | ORAL | 3 refills | Status: DC | PRN

## 2024-06-20 MED ORDER — AMOXICILLIN 875 MG PO TABS: 875.0000 mg | ORAL_TABLET | Freq: Two times a day (BID) | ORAL | 0 refills | Status: AC

## 2024-06-23 ENCOUNTER — Other Ambulatory Visit: Payer: Self-pay | Admitting: Cardiology

## 2024-06-23 DIAGNOSIS — F0670 Mild neurocognitive disorder due to known physiological condition without behavioral disturbance: Secondary | ICD-10-CM

## 2024-06-23 DIAGNOSIS — Z8679 Personal history of other diseases of the circulatory system: Secondary | ICD-10-CM

## 2024-06-23 DIAGNOSIS — I429 Cardiomyopathy, unspecified: Secondary | ICD-10-CM

## 2024-06-23 DIAGNOSIS — R55 Syncope and collapse: Secondary | ICD-10-CM

## 2024-07-03 ENCOUNTER — Ambulatory Visit: Payer: Self-pay | Admitting: Cardiology

## 2024-07-03 ENCOUNTER — Ambulatory Visit (HOSPITAL_BASED_OUTPATIENT_CLINIC_OR_DEPARTMENT_OTHER)
Admission: RE | Admit: 2024-07-03 | Discharge: 2024-07-03 | Disposition: A | Discharge status: Home / Self Care | Source: Ambulatory Visit | Attending: Cardiology | Admitting: Cardiology

## 2024-07-03 DIAGNOSIS — R55 Syncope and collapse: Secondary | ICD-10-CM | POA: Insufficient documentation

## 2024-07-03 DIAGNOSIS — I429 Cardiomyopathy, unspecified: Secondary | ICD-10-CM | POA: Insufficient documentation

## 2024-07-03 LAB — ECHOCARDIOGRAM COMPLETE
AR max vel: 2 cm2
AV Area VTI: 1.97 cm2
AV Area mean vel: 1.85 cm2
AV Mean grad: 4 mmHg
AV Peak grad: 7.3 mmHg
Ao pk vel: 1.35 m/s
Area-P 1/2: 4.36 cm2
Calc EF: 60.3 %
S' Lateral: 2.5 cm
Single Plane A2C EF: 57.6 %
Single Plane A4C EF: 62.9 %

## 2024-07-15 ENCOUNTER — Encounter: Payer: Self-pay | Admitting: Family Medicine

## 2024-07-15 ENCOUNTER — Ambulatory Visit (INDEPENDENT_AMBULATORY_CARE_PROVIDER_SITE_OTHER): Payer: Medicare Other | Admitting: Family Medicine

## 2024-07-15 VITALS — BP 118/78 | HR 73 | Temp 97.9°F | Ht 64.5 in | Wt 149.1 lb

## 2024-07-15 DIAGNOSIS — N390 Urinary tract infection, site not specified: Secondary | ICD-10-CM | POA: Diagnosis not present

## 2024-07-15 DIAGNOSIS — I951 Orthostatic hypotension: Secondary | ICD-10-CM

## 2024-07-15 DIAGNOSIS — G20B1 Parkinson's disease with dyskinesia, without mention of fluctuations: Secondary | ICD-10-CM | POA: Diagnosis not present

## 2024-07-15 DIAGNOSIS — E538 Deficiency of other specified B group vitamins: Secondary | ICD-10-CM

## 2024-07-15 DIAGNOSIS — N3946 Mixed incontinence: Secondary | ICD-10-CM | POA: Diagnosis not present

## 2024-07-15 DIAGNOSIS — R55 Syncope and collapse: Secondary | ICD-10-CM

## 2024-07-15 LAB — VITAMIN B12: Vitamin B-12: 611 pg/mL (ref 211–911)

## 2024-07-15 NOTE — Patient Instructions (Signed)
 No med changes today Keep urology appointment for next month.  Good to see you today.

## 2024-07-15 NOTE — Progress Notes (Unsigned)
 Ph: [phone] Fax: [phone]   Patient ID: [name], female    DOB: 08/19/1946, 78 y.o.   MRN: [id number]  This visit was conducted in person.  BP 118/78   Pulse 73   Temp 97.9 F (36.6 C) (Oral)   Ht 5' 4.5 (1.638 m)   Wt 149 lb 2 oz (67.6 kg)   SpO2 100%   BMI 25.20 kg/m    CC: 6 mo f/u visit  Subjective:   HPI: [name] is a 78 y.o. female presenting on 07/15/2024 for Medical Management of Chronic Issues (Pt accompanied by her son Scottie/Pt here for 6 mo f/u )   See recent notes for details.  Recent [facility] visit for UTI treated with keflex . However had ongoing urinary symptoms (suprapubic pressure, frequency, fatigue). Rpt UCx checked, started on Omnicef . Rpt UCx returned growing Enterococcus bacteria - antibiotic changed from Omnicef  (Cefdinir ) to amoxicillin . She notes ongoing suprapubic pressure and urinary frequency, but no fever/chills, dysuria.   Referred back to [facility]  for ongoing mixed incontinence - pending appt for early next month.    She previously saw Dr Ottelin at [facility] urology, last 12/2016. Also saw Dr Janit in [place] for interstitial cystitis.   Mixed incontinence: Avoiding antimuscarinics due to cognitive effects.  Avoiding alpha blockers due to orthostasis Myrbetriq  was unaffordable.   UTI history: 12/2022 - pansensitive E coli treated with kefiex 7d course 01/2023 - pansensitive E coli treated with keflex  3d course  04/2023 - abnormal UA/micro at [facility] treated with keflex  5d course, UCx not sent 03/2024 - E coli and Aerococcus treated with Omnicef  (hospitalization) 03/2024 - rpt UCx <10k single GNR - no abx 05/2024 - 10-50k Enterococcus treated with amoxicillin   Known parkinson disease followed by [facility] neurology ([name]) s/p DBS placement.  Now on fludrocortisone  0.2mg  daily for PD related orthostatic hypotension. Midodrine  caused HA, northera  unaffordable.  Continues klonopin  0.5mg  at night.  Continues sertraline   100mg  daily.  Continues Sinemet .   Notes ongoing fatigue. Next b12 shot is Thursday, receives q3 wks at home (son administers)  Recent echocardiogram reassuring. However given recurrent syncope discussing Linq long term monitor - upcoming appt with EP next month. Last episode of syncope was 03/30/2024 - while sitting at church.   Notes ongoing sleep maintenance insomnia. Does naps during the day due to sleepiness.      Relevant past medical, surgical, family and social history reviewed and updated as indicated. Interim medical history since our last visit reviewed. Allergies and medications reviewed and updated. Outpatient Medications Prior to Visit  Medication Sig Dispense Refill   acetaminophen  (TYLENOL ) 500 MG tablet Take 2 tablets (1,000 mg total) by mouth in the morning, at noon, and at bedtime.     AMBULATORY NON FORMULARY MEDICATION Lift chair Dx:  G20 1 Device 0   carbidopa -levodopa  (SINEMET  CR) 50-200 MG tablet TAKE 1 TABLET BY MOUTH EVERYDAY AT BEDTIME 90 tablet 0   carbidopa -levodopa  (SINEMET  IR) 25-100 MG tablet TAKE 1 TABLET BY MOUTH 3 (THREE) TIMES DAILY. 9AM/1PM/5PM 270 tablet 0   Cholecalciferol  (VITAMIN D3) 25 MCG (1000 UT) CAPS Take 1 capsule (1,000 Units total) by mouth daily. 30 capsule    clonazePAM  (KLONOPIN ) 0.5 MG tablet Take 1 tablet (0.5 mg total) by mouth at bedtime. 30 tablet 1   clotrimazole -betamethasone  (LOTRISONE ) cream Apply 1 application topically daily. 30 g 0   Cranberry-Vitamin C  (AZO CRANBERRY URINARY TRACT) 250-60 MG CAPS Take 2 capsules by mouth daily.  cyanocobalamin  (VITAMIN B12) 1000 MCG/ML injection Inject 1 mL (1,000 mcg total) into the muscle every 21 ( twenty-one) days. 5 mL 3   fludrocortisone  (FLORINEF ) 0.1 MG tablet Take 2 tablets (0.2 mg total) by mouth daily. 60 tablet 6   Glycerin -Hypromellose-PEG 400 (ARTIFICIAL TEARS) 0.2-0.2-1 % SOLN Place 1 drop into both eyes daily as needed (dry eyes).     Latanoprost  PF 0.005 % SOLN Apply 1 drop  to eye daily.     nystatin  cream (MYCOSTATIN ) Apply 1 Application topically 2 (two) times daily. To affected area/rash 30 g 0   sertraline  (ZOLOFT ) 100 MG tablet Take 1 tablet (100 mg total) by mouth daily. 100 tablet 3   SYRINGE-NEEDLE, DISP, 3 ML (BD SAFETYGLIDE SYRINGE/NEEDLE) 25G X 1 3 ML MISC Use to inject vitamin B12 monthly. 50 each 0   traMADol  (ULTRAM ) 50 MG tablet Take 1 tablet (50 mg total) by mouth every 6 (six) hours as needed for moderate pain (pain score 4-6). 30 tablet 3   zoledronic  acid (RECLAST ) 5 MG/100ML SOLN injection Inject 100 mLs (5 mg total) into the vein. yearly     No facility-administered medications prior to visit.     Per HPI unless specifically indicated in ROS section below Review of Systems  Objective:  BP 118/78   Pulse 73   Temp 97.9 F (36.6 C) (Oral)   Ht 5' 4.5 (1.638 m)   Wt 149 lb 2 oz (67.6 kg)   SpO2 100%   BMI 25.20 kg/m   Wt Readings from Last 3 Encounters:  07/15/24 149 lb 2 oz (67.6 kg)  06/17/24 150 lb 6 oz (68.2 kg)  05/28/24 151 lb (68.5 kg)      Physical Exam Vitals and nursing note reviewed.  Constitutional:      Appearance: Normal appearance. She is not ill-appearing.  HENT:     Head: Normocephalic and atraumatic.     Mouth/Throat:     Mouth: Mucous membranes are moist.     Pharynx: Oropharynx is clear. No oropharyngeal exudate or posterior oropharyngeal erythema.  Eyes:     Extraocular Movements: Extraocular movements intact.     Conjunctiva/sclera: Conjunctivae normal.     Pupils: Pupils are equal, round, and reactive to light.  Cardiovascular:     Rate and Rhythm: Normal rate and regular rhythm.     Pulses: Normal pulses.     Heart sounds: Normal heart sounds. No murmur heard. Pulmonary:     Effort: Pulmonary effort is normal. No respiratory distress.     Breath sounds: Normal breath sounds. No wheezing, rhonchi or rales.  Musculoskeletal:     Right lower leg: No edema.     Left lower leg: No edema.  Skin:     General: Skin is warm and dry.     Findings: No rash.  Neurological:     Mental Status: She is alert.     Comments: Dyskinesia present  Psychiatric:        Mood and Affect: Mood normal.        Behavior: Behavior normal.       Results for orders placed or performed in visit on 07/15/24  Vitamin B12   Collection Time: 07/15/24 11:45 AM  Result Value Ref Range   Vitamin B-12 611 211 - 911 pg/mL   *Note: Due to a large number of results and/or encounters for the requested time period, some results have not been displayed. A complete set of results can be found in Results Review.  Lab Results  Component Value Date   WBC 5.4 04/16/2024   HGB 12.1 04/16/2024   HCT 37.1 04/16/2024   MCV 85.6 04/16/2024   PLT 290.0 04/16/2024    Assessment & Plan:   Problem List Items Addressed This Visit     Parkinson's disease (HCC) (Chronic)   Appreciate neurology care - upcoming appt next month.  S/p DBS, continue Sinemet .       Vitamin B12 deficiency - Primary   IF Ab positive 01/2024 - consistent with pernicious anemia.  CBC normal on last cehck.  Update levels on Q3wk replacement.  Check HC as well.  Continue current regimen - son administers injection at home.       Relevant Orders   Vitamin B12 (Completed)   Homocysteine   Orthostatic hypotension   Continues fludrocortisone  0.2mg  daily.  Intolerance to midodrine , northera  was unaffordable.       Recurrent UTI   Possible chronic cystitis - pending urology eval next month. Notes ongoing suprapubic pressure and urinary frequency without fever, dysuria, hematuria. No further abx at this time.       Syncope   Pending long term Linq monitor evaluation by EP next month for recurrent syncope.  Latest episode occurred while seated at church.       Mixed stress and urge urinary incontinence   Avoiding antimuscarinics due to cognitive side effects. Avoiding alpha blockers due to orthostasis Myrbetriq  was unaffordable          No orders of the defined types were placed in this encounter.   Orders Placed This Encounter  Procedures   Vitamin B12   Homocysteine    Patient Instructions  No med changes today Keep urology appointment for next month.  Good to see you today.   Follow up plan: Return in about 6 months (around 01/15/2025) for annual exam, prior fasting for blood work, medicare wellness visit.  Anton Blas, MD

## 2024-07-16 ENCOUNTER — Ambulatory Visit: Payer: Self-pay | Admitting: Family Medicine

## 2024-07-16 ENCOUNTER — Encounter: Payer: Self-pay | Admitting: Family Medicine

## 2024-07-16 LAB — HOMOCYSTEINE: Homocysteine: 12.5 umol/L (ref <=13.4)

## 2024-07-16 NOTE — Assessment & Plan Note (Addendum)
 Pending long term Linq monitor evaluation by EP next month for recurrent syncope.  Latest episode occurred while seated at church.

## 2024-07-16 NOTE — Assessment & Plan Note (Signed)
 IF Ab positive 01/2024 - consistent with pernicious anemia.  CBC normal on last cehck.  Update levels on Q3wk replacement.  Check HC as well.  Continue current regimen - son administers injection at home.

## 2024-07-16 NOTE — Assessment & Plan Note (Signed)
 Continues fludrocortisone  0.2mg  daily.  Intolerance to midodrine , northera  was unaffordable.

## 2024-07-16 NOTE — Assessment & Plan Note (Signed)
 Possible chronic cystitis - pending urology eval next month. Notes ongoing suprapubic pressure and urinary frequency without fever, dysuria, hematuria. No further abx at this time.

## 2024-07-16 NOTE — Assessment & Plan Note (Signed)
 Avoiding antimuscarinics due to cognitive side effects. Avoiding alpha blockers due to orthostasis Myrbetriq  was unaffordable

## 2024-07-16 NOTE — Assessment & Plan Note (Signed)
 Appreciate neurology care - upcoming appt next month.  S/p DBS, continue Sinemet .

## 2024-07-17 ENCOUNTER — Encounter: Payer: Self-pay | Admitting: Family Medicine

## 2024-07-17 ENCOUNTER — Other Ambulatory Visit: Payer: Self-pay | Admitting: Family Medicine

## 2024-07-17 DIAGNOSIS — E538 Deficiency of other specified B group vitamins: Secondary | ICD-10-CM

## 2024-07-17 DIAGNOSIS — M810 Age-related osteoporosis without current pathological fracture: Secondary | ICD-10-CM

## 2024-07-18 MED ORDER — CYANOCOBALAMIN 1000 MCG/ML IJ SOLN: 1000.0000 ug | INTRAMUSCULAR | 3 refills | Status: AC

## 2024-07-22 DIAGNOSIS — H401131 Primary open-angle glaucoma, bilateral, mild stage: Secondary | ICD-10-CM | POA: Diagnosis not present

## 2024-07-28 ENCOUNTER — Other Ambulatory Visit: Payer: Self-pay | Admitting: Family Medicine

## 2024-07-28 DIAGNOSIS — E538 Deficiency of other specified B group vitamins: Secondary | ICD-10-CM

## 2024-07-29 ENCOUNTER — Ambulatory Visit: Attending: Cardiology | Admitting: Cardiology

## 2024-07-29 ENCOUNTER — Encounter: Payer: Self-pay | Admitting: Cardiology

## 2024-07-29 VITALS — BP 140/72 | HR 78 | Ht 64.5 in | Wt 145.0 lb

## 2024-07-29 DIAGNOSIS — R002 Palpitations: Secondary | ICD-10-CM | POA: Diagnosis not present

## 2024-07-29 DIAGNOSIS — I429 Cardiomyopathy, unspecified: Secondary | ICD-10-CM

## 2024-07-29 DIAGNOSIS — R55 Syncope and collapse: Secondary | ICD-10-CM

## 2024-07-29 NOTE — Patient Instructions (Signed)
 Medication Instructions:  Your physician recommends that you continue on your current medications as directed. Please refer to the Current Medication list given to you today.  *If you need a refill on your cardiac medications before your next appointment, please call your pharmacy*  Lab Work: None ordered   Testing/Procedures: None ordered  Follow-Up: At [facility], you and your health needs are our priority.  As part of our continuing mission to provide you with exceptional heart care, our providers are all part of one team.  This team includes your primary Cardiologist (physician) and Advanced Practice Providers or APPs (Physician Assistants and Nurse Practitioners) who all work together to provide you with the care you need, when you need it.  Your next appointment:   as needed  Provider:   Agatha Horsfall, MD     Thank you for choosing [facility]!!   Reece Cane, RN [phone]

## 2024-07-29 NOTE — Telephone Encounter (Signed)
 Too soon. Rx sent 07/18/24, #5 mL/3 refills to [facility].   Request denied.

## 2024-07-29 NOTE — Progress Notes (Signed)
 Electrophysiology Office Note:   Date:  07/29/2024  ID:  [name], DOB Oct 19, 1946, MRN [id number]  Primary Cardiologist: Jennifer JONELLE Crape, MD Primary Heart Failure: None Electrophysiologist: Liora Myles Gladis Norton, MD      History of Present Illness:   [name] is a 78 y.o. female with h/o syncope, Parkinson's disease, palpitations, aortic regurgitation, Takotsubo cardiomyopathy with recovered ejection fraction seen today for  for Electrophysiology evaluation of syncope at the request of Rajan Revankar.    She has had multiple episodes of syncope.  She had 1 episode at the neurologist office waiting for room in a chair.  She passed out.  Her son witnessed this.  She was unconscious for 5 minutes.  She has subsequently had 2-3 such episodes in tandem.  She had another episode where she passed out at church and apparently had no pulse.  She was admitted at the time to the hospital.  Discussed the use of AI scribe software for clinical note transcription with the patient, who gave verbal consent to proceed.  History of Present Illness   [name] is a 78 year old female with Parkinson's disease who presents with episodes of syncope.  She experiences syncope episodes, having passed out twice at church, with the most recent on May 4th and the previous one about a month prior. These episodes occur more frequently with activity. During the last episode, she stood up, sat down, and after about ten minutes, felt 'funny' and lost consciousness. In one instance, she was sitting for a while in church when she passed out, and her pulse was lost. No syncope episodes have occurred while standing.  Her blood pressure is usually low, but it was 140 on the day of the visit, which she considers high. She notes that when her blood pressure is high, it tends to drop suddenly.      Review of systems complete and found to be negative unless listed in HPI.   EP Information / Studies Reviewed:    EKG is  ordered today. Personal review as below.  EKG Interpretation Date/Time:  Tuesday July 29 2024 13:52:45 EDT Ventricular Rate:  78 PR Interval:  188 QRS Duration:  82 QT Interval:  392 QTC Calculation: 446 R Axis:   6  Text Interpretation: Normal sinus rhythm Normal ECG When compared with ECG of 27-Jul-2023 13:55, No significant change was found Confirmed by Danai Gotto ([id number]) on 07/29/2024 1:54:08 PM   Risk Assessment/Calculations:            Physical Exam:   VS:  There were no vitals taken for this visit.   Wt Readings from Last 3 Encounters:  07/15/24 149 lb 2 oz (67.6 kg)  06/17/24 150 lb 6 oz (68.2 kg)  05/28/24 151 lb (68.5 kg)     GEN: Well nourished, well developed in no acute distress NECK: No JVD; No carotid bruits CARDIAC: Regular rate and rhythm, no murmurs, rubs, gallops RESPIRATORY:  Clear to auscultation without rales, wheezing or rhonchi  ABDOMEN: Soft, non-tender, non-distended EXTREMITIES:  No edema; No deformity   ASSESSMENT AND PLAN:    1.  Recurrent syncope: Has had episodes of syncope while sitting.  He does have Parkinson's which potentially complicates the issue.  She has worn cardiac monitors without obvious cause.  The patient had a syncopal episode while in clinic.  During the episode, her blood pressure was in the 170s to 180s systolic and heart rates were in the 80s to 90s.  She had not yet had her ILR implanted.  Her EKG showed sinus rhythm with PACs.  Due to her stable vitals at the time of her episode of syncope, Danil Wedge not plan on ILR implantation.  Further workup per primary physician, neurology, primary cardiology.  Case discussed with primary cardiology.  2.  History of Takotsubo cardiomyopathy: Ejection fraction has normalized.  Follow up with Dr. Inocencio pending ILR results   Signed, Senia Even Gladis Inocencio, MD

## 2024-08-05 NOTE — Progress Notes (Unsigned)
 Assessment/Plan:    1.  Parkinsons Disease             -Patient completed neurocognitive testing on January 25, 2021 with [name].  Evidence of MCI only.  -Patient is status post bilateral STN DBS on July 07, 2021 with IPG placement on July 14, 2021.    -For now, she will continue carbidopa /levodopa  25/100, she is only taking 1 tablet twice a day.  I told her she can take an extra as needed.  -Continue carbidopa /levodopa  50/200 at bed   -discussed vyalev and onapgo but she doesn't need that  -discussed focused ultrasound as she asked about it today but she has already had dbs therapy  - Medications previously tried for scalp wire discomfort include gabapentin , nortriptyline , Lyrica , clonazepam . 2.  Dyskinesia             - None noted currently. 3.  Neurogenic Orthostatic Hypotension             -Did well on Northera .  Patient stopped that on her own.   -Now on Florinef  0.2 mg daily, increased when in the hospital May, 2025 at [facility] - She is currently wearing compression stockings.  She does not like them.  We talked about the abdominal compression binder.  She admits she has 1 at home, but has not worn it in a long time.  I encouraged her to do so.  She has been on droxidopa  in the past, but had weaned off of it (she did not recall that) 4.  Depression             -On sertraline .  Doing well. 5.  b12 deficiency             -on injections.  Level still low.  will check MMA/homocysteine.   6.  Urinary incontinence             -on myrbetriq .  She is doing well in that regard 7.  Lumbar radiculopathy             -She is status post lumbar foraminotomy with [name] on November 27, 2019.  Pain has reemerged and she underwent L4-L5 laminectomy with [name] on May 16, 2022. 8.  Depression  -On sertraline , 100 mg daily. 9.  Dysphagia  -MBE done in June, 2023 with mild oropharyngeal dysphagia.  Regular diet with thin liquids recommended. 10.  History of multiple syncopal  episodes, clustering, and occurring in any position (laying included)  -Extensive cardiac and neurowork-up has been unremarkable thus far, including longterm EEG monitoring in hospital  - Cardiology declined loop recorder implantation when patient was seen July 29, 2024.  - Patient apparently had another episode yesterday while at the urology office.  Her blood pressure was apparently quite high.    - Functional events are on the listed differential, after witnessing episodes.  I do feel confident that these are nonneurologic. 11.  Chronic uti  -following with urology, [name]  Subjective:   [name] was seen today in follow up for Parkinsons.  Medical records made available to me are reviewed.  Son is with patient and supplements the history.  Patient did see the cardiologist in July.  I have reviewed his notes.  He talked to her about a Linq and he noted that patient was agreeable.  He sent the patient to [name], who saw the patient September 2.  He did not recommend loop recorder.  She saw urology yesterday  and she was given an abx for uti.  She noted she had another syncopal episode at their office and her BP was very high.  Current prescribed movement disorder medications: Carbidopa /levodopa  25/100, 10am and 3-4 pm Carbidopa /levodopa  50/200 at bedtime   PREVIOUS MEDICATIONS: northera  (worked well but pt d/c when they quit sending b/c she owed $); amantadine  (discontinued because of hallucinations); pramipexole  (decreased in past because of hallucinations/dyskinesia); clonazepam  (stopped it because of fatigue, but also because she just did not need it any longer); lyrica ; pamelor  (n/v)  ALLERGIES:   Allergies  Allergen Reactions   Iohexol Hives and Shortness Of Breath     Code: HIVES, Desc: PT developed 2 hives, followed by SOB, severe headache post 87cc's Omnipaque 300., Onset Date: [id number]    Amitriptyline Other (See Comments)    Sedated next morning    Ciprofloxacin  Nausea And Vomiting   Cymbalta  [Duloxetine  Hcl] Other (See Comments)    Worsened depression   Lyrica  [Pregabalin ] Other (See Comments)    Tried during hospitalization - unsure effects but unable to tolerate   Imipramine Hcl Rash   Iodine Rash   Lidocaine  Hcl Rash   Morphine Sulfate Rash   Neosporin [Neomycin-Bacitracin  Zn-Polymyx] Rash and Other (See Comments)    Worsened skin breaking out   Sulfamethoxazole Rash   Tetracyclines & Related Rash    CURRENT MEDICATIONS:  Outpatient Encounter Medications as of 08/07/2024  Medication Sig   acetaminophen  (TYLENOL ) 500 MG tablet Take 2 tablets (1,000 mg total) by mouth in the morning, at noon, and at bedtime.   AMBULATORY NON FORMULARY MEDICATION Lift chair Dx:  G20   carbidopa -levodopa  (SINEMET  CR) 50-200 MG tablet TAKE 1 TABLET BY MOUTH EVERYDAY AT BEDTIME   carbidopa -levodopa  (SINEMET  IR) 25-100 MG tablet TAKE 1 TABLET BY MOUTH 3 (THREE) TIMES DAILY. 9AM/1PM/5PM   Cholecalciferol  (VITAMIN D3) 25 MCG (1000 UT) CAPS Take 1 capsule (1,000 Units total) by mouth daily.   clonazePAM  (KLONOPIN ) 0.5 MG tablet Take 1 tablet (0.5 mg total) by mouth at bedtime.   Cranberry-Vitamin C  (AZO CRANBERRY URINARY TRACT) 250-60 MG CAPS Take 2 capsules by mouth daily.   cyanocobalamin  (VITAMIN B12) 1000 MCG/ML injection Inject 1 mL (1,000 mcg total) into the muscle every 21 ( twenty-one) days.   fludrocortisone  (FLORINEF ) 0.1 MG tablet Take 2 tablets (0.2 mg total) by mouth daily.   Glycerin -Hypromellose-PEG 400 (ARTIFICIAL TEARS) 0.2-0.2-1 % SOLN Place 1 drop into both eyes daily as needed (dry eyes).   Latanoprost  PF 0.005 % SOLN Apply 1 drop to eye daily.   sertraline  (ZOLOFT ) 100 MG tablet Take 1 tablet (100 mg total) by mouth daily.   SYRINGE-NEEDLE, DISP, 3 ML (BD SAFETYGLIDE SYRINGE/NEEDLE) 25G X 1 3 ML MISC Use to inject vitamin B12 monthly.   traMADol  (ULTRAM ) 50 MG tablet Take 1 tablet (50 mg total) by mouth every 6 (six) hours as  needed for moderate pain (pain score 4-6).   trimethoprim  (TRIMPEX ) 100 MG tablet Take 100 mg by mouth daily.   zoledronic  acid (RECLAST ) 5 MG/100ML SOLN injection Inject 100 mLs (5 mg total) into the vein. yearly   No facility-administered encounter medications on file as of 08/07/2024.    Objective:   PHYSICAL EXAMINATION:    VITALS:   Vitals:   08/07/24 1344  BP: 132/72  Pulse: 74  SpO2: 98%  Weight: 132 lb 9.6 oz (60.1 kg)  Height: 5' 7 (1.702 m)     GEN:  The patient appears stated age and is in  NAD. HEENT:  Normocephalic, atraumatic.  The mucous membranes are moist. The superficial temporal arteries are without ropiness or tenderness. CV:  RRR Lungs:  CTAB Neck/HEME:  There are no carotid bruits bilaterally.  Neurological examination:  Orientation: The patient is alert and oriented x3. Cranial nerves: There is chronic L facial droop.  Smile is symmetric. The speech is fluent and hypophonic and occasionally dysarthric.  This is her baseline.  Soft palate rises symmetrically and there is no tongue deviation. Hearing is intact to conversational tone. Sensation: Sensation is intact to light touch throughout Motor: Strength is at least antigravity x4.  Movement examination: Tone: There is normal tone in the UE, RLE but there is mild increased tone in the LLE Abnormal movements: no tremor Coordination:  There is no significant decremation today. Gait and Station: Not tested today     Chemistry      Component Value Date/Time   NA 140 04/16/2024 1241   K 3.7 04/16/2024 1241   CL 102 04/16/2024 1241   CO2 31 04/16/2024 1241   BUN 13 04/16/2024 1241   CREATININE 0.70 04/16/2024 1241   CREATININE 0.87 10/08/2019 0921      Component Value Date/Time   CALCIUM  9.4 04/16/2024 1241   ALKPHOS 109 01/10/2024 0911   AST 10 01/10/2024 0911   ALT 1 01/10/2024 0911   BILITOT 0.5 01/10/2024 0911       Lab Results  Component Value Date   WBC 5.4 04/16/2024   HGB 12.1  04/16/2024   HCT 37.1 04/16/2024   MCV 85.6 04/16/2024   PLT 290.0 04/16/2024    Lab Results  Component Value Date   TSH 1.12 01/10/2024    Total time spent on today's visit was 44 minutes, including both face-to-face time and nonface-to-face time.  Time included that spent on review of records (prior notes available to me/labs/imaging if pertinent), discussing treatment and goals, answering patient's questions and coordinating care.  This did not include DBS time.     Cc:  Rilla Baller, MD

## 2024-08-06 DIAGNOSIS — R350 Frequency of micturition: Secondary | ICD-10-CM | POA: Diagnosis not present

## 2024-08-07 ENCOUNTER — Ambulatory Visit: Admitting: Neurology

## 2024-08-07 VITALS — BP 132/72 | HR 74 | Ht 67.0 in | Wt 132.6 lb

## 2024-08-07 DIAGNOSIS — R55 Syncope and collapse: Secondary | ICD-10-CM | POA: Diagnosis not present

## 2024-08-07 DIAGNOSIS — Z9689 Presence of other specified functional implants: Secondary | ICD-10-CM | POA: Diagnosis not present

## 2024-08-07 DIAGNOSIS — G20A1 Parkinson's disease without dyskinesia, without mention of fluctuations: Secondary | ICD-10-CM | POA: Diagnosis not present

## 2024-08-07 DIAGNOSIS — N390 Urinary tract infection, site not specified: Secondary | ICD-10-CM | POA: Diagnosis not present

## 2024-08-07 NOTE — Procedures (Signed)
 DBS Programming was performed.    Manufacturer of DBS device: [employer], bluetooth  Total time spent programming was 8 minutes.  Device was confirmed to be on.  Soft start was confirmed to be on.  Impedences were checked and were within normal limits.  Battery was checked and was determined to be functioning normally and not near the end of life.  Final settings were as follows:      Active Contact Amplitude (mA) PW (ms) Frequency (hz) Side Effects  Left Brain       08/08/21 3-C+ 1.7 60 130   Other trials        1-C+ 2.7 60 130 tremor   (2/3/4)-C+ 3.0 60 130 Face pull above 2.1   (5/6/7)-C+ 1.8 60 130 ? Face pull (some baseline facial asymmetry)  08/22/21 5-(25%)7-(75%)C+ 2.5 60 159   09/05/21 final 5-(25%)6-(75%)C+ 2.9 60 159   Other trials 5-(50%)6-(50%)C+ 2.9 60 159    5-(25%)7-(75%)C+ 3.5 60 159 Tremor not controlled  10/31/21 5-(25%)7-(75%)C+ 3.3 60 159   05/01/22 5-(25%)7-(75%)C+ 3.6 60 159 Transient lip tingle  08/11/22 5-(25%)7-(75%)C+ 3.7 60 159   09/25/22 6-(25%)7-(75%)C+ 3.7 60 159   03/16/23 3c(25%)3b(75%)C+ 3.7 60 159   08/16/23 3c(25%)3b(75%)C+ 3.6 60 159   02/13/24 3c(25%)3b(75%)C+ 3.6 60 159   04/24/24 3c(25%)3b(75%)C+ 3.7 60 159                 Right Brain       08/08/21 (5/6/7)-C+ 1.6 60 130   Other trials 1-C+ 1.5 60 130 ? Speech change   (2/3/4)-C+ 1.5 60 130 Pretty good   8-C+ 1.6 60 130 tremor  08/22/21 8-C+ 2.7 60 136   09/05/21 final 6-(50%)8-(50%)C+ 2.9 60 159   others 6-(60%)8-(40%)C+ 2.8  60 136 Mouth dyskinesia  10/31/21 6-(50%)8-(50%)C+ 3.4 60 159   05/01/22 6-(50%)8-(50%)C+ 3.6 60 159   08/11/22 6-(50%)8-(50%)C+ 3.5 60 159   09/25/22 6-(50%)8-(50%)C+ 3.5 60 159   03/16/23 3b(50%)4(50%)C+ 3.5 60 159   08/16/23 3b(50%)4(50%)C+ 3.5 60 159   02/13/24 3b(50%)4(50%)C+ 3.5 60 159   04/24/24 3b(50%)4(50%)C+ 3.5 60 159

## 2024-08-08 ENCOUNTER — Other Ambulatory Visit: Payer: Self-pay | Admitting: Neurology

## 2024-08-08 DIAGNOSIS — G20B2 Parkinson's disease with dyskinesia, with fluctuations: Secondary | ICD-10-CM

## 2024-08-08 DIAGNOSIS — G20A1 Parkinson's disease without dyskinesia, without mention of fluctuations: Secondary | ICD-10-CM

## 2024-08-12 ENCOUNTER — Ambulatory Visit

## 2024-08-15 ENCOUNTER — Encounter: Admitting: Neurology

## 2024-08-15 ENCOUNTER — Other Ambulatory Visit: Payer: Self-pay | Admitting: Family Medicine

## 2024-08-15 DIAGNOSIS — F321 Major depressive disorder, single episode, moderate: Secondary | ICD-10-CM

## 2024-08-18 NOTE — Telephone Encounter (Signed)
 ERx

## 2024-08-27 ENCOUNTER — Telehealth: Payer: Self-pay | Admitting: Neurology

## 2024-08-27 ENCOUNTER — Other Ambulatory Visit: Payer: Self-pay

## 2024-08-27 DIAGNOSIS — G20B2 Parkinson's disease with dyskinesia, with fluctuations: Secondary | ICD-10-CM

## 2024-08-27 NOTE — Telephone Encounter (Signed)
-----   Message from Merrill Burtis Imhoff sent at 08/07/2024  2:21 PM EDT ----- Send PT referral at [facility]

## 2024-08-27 NOTE — Telephone Encounter (Signed)
 PT sent to [facility]

## 2024-09-03 ENCOUNTER — Ambulatory Visit

## 2024-09-23 DIAGNOSIS — R3915 Urgency of urination: Secondary | ICD-10-CM | POA: Diagnosis not present

## 2024-09-23 DIAGNOSIS — R350 Frequency of micturition: Secondary | ICD-10-CM | POA: Diagnosis not present

## 2024-09-23 DIAGNOSIS — N390 Urinary tract infection, site not specified: Secondary | ICD-10-CM | POA: Diagnosis not present

## 2024-09-25 ENCOUNTER — Ambulatory Visit: Attending: Neurology | Admitting: Physical Therapy

## 2024-09-25 DIAGNOSIS — M6281 Muscle weakness (generalized): Secondary | ICD-10-CM | POA: Insufficient documentation

## 2024-09-25 DIAGNOSIS — R262 Difficulty in walking, not elsewhere classified: Secondary | ICD-10-CM | POA: Diagnosis present

## 2024-09-25 DIAGNOSIS — G20B1 Parkinson's disease with dyskinesia, without mention of fluctuations: Secondary | ICD-10-CM | POA: Insufficient documentation

## 2024-09-25 DIAGNOSIS — G20B2 Parkinson's disease with dyskinesia, with fluctuations: Secondary | ICD-10-CM | POA: Insufficient documentation

## 2024-09-25 DIAGNOSIS — R2681 Unsteadiness on feet: Secondary | ICD-10-CM | POA: Diagnosis present

## 2024-09-25 NOTE — Therapy (Signed)
 OUTPATIENT PHYSICAL THERAPY NEURO EVALUATION   Patient Name: [name] MRN: [id number] DOB:December 01, 1945, 78 y.o., female Today's Date: 09/25/2024   PCP: Rilla MART PROVIDER: [name]   PT End of Session - 09/25/24 1447     Visit Number 1    Date for Recertification  12/26/24    Authorization Type [employer] mcare    PT Start Time 1445    PT Stop Time 1530    PT Time Calculation (min) 45 min    Activity Tolerance Patient tolerated treatment well    Behavior During Therapy [facility] for tasks assessed/performed           Past Medical History:  Diagnosis Date   Advanced care planning/counseling discussion 05/26/2015   Advanced directive - scanned 07/2021 - wants all children involved in her medical decision, starting with son [name]. Doesn't want prolonged life support if terminal condition however ok with tube feeding.     Allergy    Anemia 09/25/2007   With mild leukopenia, on iron and b12 replacement.  Consider SPEP     Anxiety    Arthritis    Blood transfusion without reported diagnosis    CAD (coronary artery disease) 01/2010   MI, Nishan   Cataract    CHF (congestive heart failure) (HCC)    with episode of sepsis   Chronic cough 07/16/2017   Increased gabapentin  to 100 tid from 100 qhs 09/04/2017  Plus max gerd rx   -  MBS 09/11/2017  Ok, rec barium swallow  - DgEs 09/21/2017 >>> Occasional secondary and tertiary contractions in the distal half of the esophagus, but primary peristaltic waves in the esophagus were essentially intact. No appreciable esophageal stricture or ulceration. In borderline slow but successful progress of the    Chronic insomnia 03/07/2016   Chronic, continuous use of opioids 12/07/2013   Closed fracture of rib of right side with routine healing 05/28/2021   Complication of anesthesia    Constipation 08/06/2018   DDD (degenerative disc disease), lumbar 05/31/2013   Lspine 2011 - mild DDD  S/p laminectomy and foraminotomy early 2021 Humphrey)  S/p  lumbar laminectomy 04/2022 (Dr Dawley)      Degenerative lumbar spinal stenosis 11/27/2019   Depression    not recently   Dysphagia 10/09/2017   Dysuria 09/02/2013   Encounter for general adult medical examination with abnormal findings 05/21/2014   Family circumstance 02/23/2015   Fibromyalgia    GAD (generalized anxiety disorder) 04/28/2016   GERD 02/22/2010   Glaucoma 02/2013   Headache    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   History of colon polyps 2004   HLD (hyperlipidemia) 05/20/2013   Hypercalcemia 06/09/2023   Hypertension 05/20/2023   HYPOTENSION, ORTHOSTATIC 12/06/2008   Interstitial cystitis    Ottelin now Dr Janit   Knee pain, bilateral 08/06/2018   Localized swelling of chest wall 12/23/2021   Lumbar radiculopathy, chronic 05/16/2022   MCTD (mixed connective tissue disease) 02/08/2010   MDD (major depressive disorder), single episode, moderate (HCC) 11/12/2013   Medicare annual wellness visit, subsequent 05/20/2013   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   Neck pain 07/11/2023   Osteoporosis 11/10/2009   Other fatigue 11/25/2021   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   Dr Zaldivar plastic surgery     Paraparesis (HCC) 07/25/2023   Parkinson's disease (HCC) 08/25/2015   Dx Dr [name] 07/2015    Pneumonia    PONV (postoperative nausea and vomiting)    Pre-op  evaluation 04/26/2022   Rash and other nonspecific skin eruption 05/11/2010   Annotation: thought to be autoimmune---responsive to cellcept   Has had eval at two tertiary centers  Rash is responsive to cellcept      Recurrent UTI 01/03/2011   UTI history:  12/2022 - pansensitive E coli treated with kefiex 7d course  01/2023 - pansensitive E coli treated with keflex  3d course   04/2023 - abnormal UA/micro at [facility] treated with keflex  5d course, UCx not sent     Right leg weakness 07/25/2023   Right lumbar radiculopathy 04/10/2018   MRI Lumbar  spine 10/2018:   Multilevel DDD and facet degeneration with some resultant spinal and foraminal stenosis most prominent L5/S1 with R protrusion impinging on L5 nerve root, shallow central protrusion.   Saw [facility]) referred to Dr Bonner PMR 11/2018  LESI x3 with mild benefit 12/2018  S/p laminectomy/foraminotomy 2021 [facility])  S/p lumbar laminectomy 04/2022 (Dr Dawley)      Syncope 01/03/2011   Zio patch 05/2023:  HR 58 - 171 bpm, average 78 bpm.  7 nonsustained SVT, longest 6 beats.  Rare supraventricular and ventricular ectopy.  No sustained arrhythmias.  No atrial fibrillation.     Takotsubo cardiomyopathy 2008   due to E coli urosepsis   Urinary incontinence 01/12/2023   Valvular heart disease 07/11/2023   Echocardiogram 05/21/2023 at [facility]  LVEF 55-60%, normal wall motion, impaired relaxation pattern consistent with age related change, mild MR, tr TR, mild aortic sclerosis without stenosis, mild-mod AR, fair quality      Visual changes 05/20/2023   Vitamin B12 deficiency 05/03/2007   IF negative (02/2017)     Vitamin D  deficiency 05/09/2013   Past Surgical History:  Procedure Laterality Date   ABDOMINAL HYSTERECTOMY  1970s   IUD infection - first partial then with oophorectomy (cysts), complication - low blood pressure   CATARACT EXTRACTION Bilateral    CHOLECYSTECTOMY     complication - low blood pressure   COLONOSCOPY  06/2008   h/o polyps but latest WNL, rec rpt 10 yrs Priscilla)   COLONOSCOPY  11/2018   multiple TAs (10 polyps total), rpt 2 yrs Oma)   COLONOSCOPY  04/2021   multiple TAs, rpt 3 yrs Oma)   CYSTOSCOPY  12/2013   abx treatment for recurrent cystitis   DEXA  04/2013   T -2.9 @ femur, -1.6 @ spine   DEXA  04/2017   T -2.9 hip, -0.7 spine   ESOPHAGOGASTRODUODENOSCOPY  12/2017   WNL, regardless esophagus dilated, small HH Oma)   EYE SURGERY     LUMBAR LAMINECTOMY/DECOMPRESSION MICRODISCECTOMY Right 11/27/2019   Right Lumbar Four-Five foraminotomy;   [facility] Pac, MD)   LUMBAR LAMINECTOMY/DECOMPRESSION MICRODISCECTOMY Right 05/16/2022   Procedure: OPEN LUMBAR LAMINECTOMY, RT, L45 W/LATERAL RECESS DECOMPRESSION;  Surgeon: Carollee Lani BROCKS, DO;  Location: [facility] OR;  Service: Neurosurgery;  Laterality: Right;  3C   MINOR PLACEMENT OF FIDUCIAL N/A 06/30/2021   Procedure: MINOR PLACEMENT OF FIDUCIAL;  Surgeon: Unice Pac, MD;  Location[facility] OR;  Service: Neurosurgery;  Laterality: N/A;  Minor room   PTOSIS REPAIR Bilateral 10/2020   Plastic Surgery   PULSE GENERATOR IMPLANT Right 07/14/2021   Procedure: UNILATERAL PULSE GENERATOR IMPLANT;  Surgeon: Unice Pac, MD;  Location: [facility] OR;  Service: Neurosurgery;  Laterality: Right;   SUBTHALAMIC STIMULATOR INSERTION Bilateral 07/07/2021   Procedure: Deep brain stimulator placement;  Surgeon: Unice Pac, MD;  Location: [facility] OR;  Service: Neurosurgery;  Laterality: Bilateral;  TUBAL LIGATION     UPPER GASTROINTESTINAL ENDOSCOPY     Patient Active Problem List   Diagnosis Date Noted   History of cardiomyopathy 05/28/2024   Pernicious anemia 02/27/2024   Palpitations 07/27/2023   Moderate aortic regurgitation 07/27/2023   Right leg weakness 07/25/2023   Cataract    CHF (congestive heart failure) (HCC)    Complication of anesthesia    Interstitial cystitis    PONV (postoperative nausea and vomiting)    Valvular heart disease 07/11/2023   Visual changes 05/20/2023   Mixed stress and urge urinary incontinence 01/12/2023   Lumbar radiculopathy, chronic 05/16/2022   Pre-op evaluation 04/26/2022   Localized swelling of chest wall 12/23/2021   Other fatigue 11/25/2021   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   Degenerative lumbar spinal stenosis 11/27/2019   Knee pain, bilateral 08/06/2018   Chronic constipation 08/06/2018   Right lumbar radiculopathy  04/10/2018   Dysphagia 10/09/2017   Chronic cough 07/16/2017   GAD (generalized anxiety disorder) 04/28/2016   Chronic insomnia 03/07/2016   Parkinson's disease (HCC) 08/25/2015   Advanced care planning/counseling discussion 05/26/2015   Encounter for general adult medical examination with abnormal findings 05/21/2014   Chronic, continuous use of opioids 12/07/2013   MDD (major depressive disorder), single episode, moderate (HCC) 11/12/2013   DDD (degenerative disc disease), lumbar 05/31/2013   Medicare annual wellness visit, subsequent 05/20/2013   HLD (hyperlipidemia) 05/20/2013   Vitamin D  deficiency 05/09/2013   Glaucoma 02/2013   Recurrent UTI 01/03/2011   Syncope 01/03/2011   Rash and other nonspecific skin eruption 05/11/2010   GERD 02/22/2010   MCTD (mixed connective tissue disease) 02/08/2010   Osteoporosis 11/10/2009   Orthostatic hypotension 12/06/2008   Vitamin B12 deficiency 05/03/2007   Fibromyalgia 05/03/2007   Takotsubo cardiomyopathy 2008   History of colon polyps 2004    ONSET DATE: 08/27/24  REFERRING DIAG: Parkinson's, S/P lumbar surgery  THERAPY DIAG:  Unsteadiness on feet  Parkinson's disease with dyskinesia, unspecified whether manifestations fluctuate (HCC)  Muscle weakness (generalized)  Difficulty in walking, not elsewhere classified  Rationale for Evaluation and Treatment Rehabilitation  SUBJECTIVE:                                                                                                                                                                                              SUBJECTIVE STATEMENT: Patient was being seen here last year , has diagnosis of PD.  She had a few times that she passed out, reports that she  is more unsteady, she and her son reports that her legs have been freezing at times,  Reports that she has tried to walk  Pt accompanied by: son  PERTINENT HISTORY: see above, did have lumbar surgery in June, has a deep  brain stimulator for movement disorder  PAIN:  Are you having pain? Yes: NPRS scale: 3/10 Pain location: lleft thoracic and lumbar area Pain description: ache/sore Aggravating factors: bending, lifting,doing too much pain can be 6/10 Relieving factors: rest can be 0/10  PrecAUTIONS: None  WEIGHT BEARING RESTRICTIONS No  FALLS: Has patient fallen in last 6 months? no  LIVING ENVIRONMENT: Lives with: lives with their family Lives in: House/apartment Stairs: Yes: Internal: 12 steps; can reach both Has following equipment at home: Single point cane and Walker - 4 wheeled  PLOF: Independent, does some housework, does some cooking  PATIENT GOALS:  be stronger, walk longer and better, have less episodes of freezing  OBJECTIVE:   COGNITION: Overall cognitive status: Within functional limits for tasks assessed   SENSATION: WFL   POSTURE: rounded shoulders, forward head, and decreased lumbar lordosis  LOWER EXTREMITY ROM:    Motions are WFL's but she has poor control of the right LE, weakness and dyskinesia  LOWER EXTREMITY MMT:    MMT Right Eval Left Eval  Hip flexion 4- 4-  Hip extension 4- 4-  Hip abduction 4- 4-  Hip adduction 4- 4-  Hip internal rotation    Hip external rotation    Knee flexion 4- 4  Knee extension 4- 4  Ankle dorsiflexion 4- 4-  Ankle plantarflexion    Ankle inversion    Ankle eversion 4- 4-  (Blank rows = not tested)  TRANSFERS:  Has to use hands, if not falls back into chair, had to have multiple attempts at times due to falling back in chair  GAIT: Gait pattern: has extraneous motions of the right arm and the right leg and the shoulders and head at times Distance walked: 300 feet Assistive device utilized: Walker - 4 wheeled and None Level of assistance: Complete Independence and CGA  FUNCTIONAL TESTs:  5 times sit to stand: Patient can do with using one hand on the chair, did this in 36 seconds, if she attempts without hands she  falls back, does not lean forward pushes back, in 2024 this was 56 seconds Timed up and go (TUG): 20 seconds, with 4WW, uses hands to push up from sitting, in 2024 this was 60 seconds BERG balance 35/56, in 2024 she scored 12/56 3 minute walk test 300 feet with 4WW, in 2024 this was 200 feet  TODAY'S TREATMENT:  09/25/24 Evaluation  PATIENT EDUCATION: Education details: POC Person educated: Patient and Child(ren) Education method: Explanation Education comprehension: verbalized understanding   HOME EXERCISE PROGRAM: Spoke with her and her son about some walking in the house with walker, seated kicks, marches and toe taps, went over PWR moves in sitting, gave handout and she told me about a youtube channel that she does them on    GOALS: Goals reviewed with patient? Yes  SHORT TERM GOALS: Target date: 10/23/24  Independent with initial HEP  Goal status: INITIAL   LONG TERM GOALS: Target date: 11/26/24  Independent with advanced HEP Goal status: INITIAL  2.  Decrease TUG time to 20 seconds Goal status: INITIAL  3.  Increase BERG balance test score to 42/56 Goal status: INITIAL  4. Walk 360 feet in the 3 minute walk test Goal status: INITIAL  5.  Get up from sitting without using hands Goal status: INITIAL  ASSESSMENT:  CLINICAL IMPRESSION: Patient is a 78 y.o. female who was seen today for physical therapy evaluation for Parkinson's   in May of 2024 she was using a SPC and or nothing, today using a 4WW.  In May 2024 she did the TUG in 16 seconds without device, October 2024 it was 60 seconds with FWW, today it was 20 seconds, in May 2024  she did 330 feet without device during 3 minute walk test, October 2024 did 200 feet, today did 300 feet.  Her berg in October 2024 was 12/56 , today was 35/56, she is doing well and has not had a regression, discussed what she is doing and what she continues to need to do.  Her and her son were agreeable  OBJECTIVE IMPAIRMENTS  Abnormal gait, decreased activity tolerance, decreased balance, decreased coordination, decreased endurance, decreased mobility, difficulty walking, decreased ROM, decreased strength, increased muscle spasms, impaired flexibility, improper body mechanics, postural dysfunction, and pain.   REHAB POTENTIAL: Good  CLINICAL DECISION MAKING: Stable/uncomplicated  EVALUATION COMPLEXITY: Low  PLAN: PT FREQUENCY: 1x/week  PT DURATION: 9 weeks  PLANNED INTERVENTIONS: Therapeutic exercises, Therapeutic activity, Neuromuscular re-education, Balance training, Gait training, Patient/Family education, Self Care, Joint mobilization, Stair training, Electrical stimulation, Spinal mobilization, Cryotherapy, Moist heat, and Manual therapy  PLAN FOR NEXT SESSION: will defer to pick her up on the case load as she is doing very well and is better than last year   OBADIAH OZELL ORN, PT 09/25/2024, 2:47 PM

## 2024-10-13 ENCOUNTER — Other Ambulatory Visit: Payer: Self-pay | Admitting: Family Medicine

## 2024-10-13 DIAGNOSIS — M51369 Other intervertebral disc degeneration, lumbar region without mention of lumbar back pain or lower extremity pain: Secondary | ICD-10-CM

## 2024-10-13 DIAGNOSIS — F321 Major depressive disorder, single episode, moderate: Secondary | ICD-10-CM

## 2024-10-14 ENCOUNTER — Ambulatory Visit (INDEPENDENT_AMBULATORY_CARE_PROVIDER_SITE_OTHER)
Admission: RE | Admit: 2024-10-14 | Discharge: 2024-10-14 | Disposition: A | Discharge status: Home / Self Care | Source: Ambulatory Visit | Attending: Family Medicine | Admitting: Family Medicine

## 2024-10-14 DIAGNOSIS — M810 Age-related osteoporosis without current pathological fracture: Secondary | ICD-10-CM | POA: Diagnosis not present

## 2024-10-14 NOTE — Telephone Encounter (Signed)
 Name of Medication:  Clonazepam  last fill 08/18/24 Trazadone 06/20/24 Name of Pharmacy: [facility] Last Office Visit and Type: 07/15/24 f/u  Next Office Visit and Type: none  Last Controlled Substance Agreement Date: none  Last UDS: none

## 2024-10-15 ENCOUNTER — Encounter: Payer: Self-pay | Admitting: Family Medicine

## 2024-10-16 NOTE — Telephone Encounter (Signed)
 ERx

## 2024-10-17 NOTE — Telephone Encounter (Signed)
 Duplicate all meds have been refilled.  No further action needed at this time.

## 2024-10-20 ENCOUNTER — Ambulatory Visit: Payer: Self-pay | Admitting: Family Medicine

## 2024-10-20 DIAGNOSIS — M810 Age-related osteoporosis without current pathological fracture: Secondary | ICD-10-CM

## 2024-12-05 NOTE — Progress Notes (Unsigned)
 "   Assessment/Plan:    1.  Parkinsons Disease             -Patient completed neurocognitive testing on January 25, 2021 with Dr. Jackquline.  Evidence of MCI only.  -Patient is status post bilateral STN DBS on July 07, 2021 with IPG placement on July 14, 2021.    -For now, she will continue carbidopa /levodopa  25/100, she is only taking 1 tablet twice a day.  I told her she can take an extra as needed.  -Continue carbidopa /levodopa  50/200 at bed   -discussed vyalev and onapgo but she doesn't need that  -discussed focused ultrasound as she asked about it today but she has already had dbs therapy  - Medications previously tried for scalp wire discomfort include gabapentin , nortriptyline , Lyrica , clonazepam . 2.  Dyskinesia             - None noted currently. 3.  Neurogenic Orthostatic Hypotension             -Did well on Northera .  Patient stopped that on her own.   -Now on Florinef  0.2 mg daily, increased when in the hospital May, 2025 at [facility] - She is currently wearing compression stockings.  She does not like them.  We talked about the abdominal compression binder.  She admits she has 1 at home, but has not worn it in a long time.  I encouraged her to do so.  She has been on droxidopa  in the past, but had weaned off of it (she did not recall that) 4.  Depression             -On sertraline .  Doing well. 5.  b12 deficiency             -on injections.  Level still low.  will check MMA/homocysteine.   6.  Urinary incontinence             -on myrbetriq .  She is doing well in that regard 7.  Lumbar radiculopathy             -She is status post lumbar foraminotomy with Dr. Unice on November 27, 2019.  Pain has reemerged and she underwent L4-L5 laminectomy with Dr. Carollee on May 16, 2022. 8.  Depression  -On sertraline , 100 mg daily. 9.  Dysphagia  -MBE done in June, 2023 with mild oropharyngeal dysphagia.  Regular diet with thin liquids recommended. 10.  History of multiple syncopal  episodes, clustering, and occurring in any position (laying included)  -Extensive cardiac and neurowork-up has been unremarkable thus far, including longterm EEG monitoring in hospital  - Cardiology declined loop recorder implantation when patient was seen July 29, 2024.  - Patient apparently had another episode yesterday while at the urology office.  Her blood pressure was apparently quite high.    - Functional events are on the listed differential, after witnessing episodes.  I do feel confident that these are nonneurologic. 11.  Chronic uti  -following with urology, Dr. Marykay  Subjective:   [name] was seen today in follow up for Parkinsons.  Medical records made available to me are reviewed.  Son is with patient and supplements the history.  Patient has had no falls since last visit.  No lightheadedness or near syncope.  She is continuing to deal with back pain and takes tramadol  from primary care.  She notes no hallucinations.  Current prescribed movement disorder medications: Carbidopa /levodopa  25/100, 10am and 3-4 pm Carbidopa /levodopa  50/200 at bedtime   PREVIOUS  MEDICATIONS: northera  (worked well but pt d/c when they quit sending b/c she owed $); amantadine  (discontinued because of hallucinations); pramipexole  (decreased in past because of hallucinations/dyskinesia); clonazepam  (stopped it because of fatigue, but also because she just did not need it any longer but now back on it by pcp); lyrica ; pamelor  (n/v)  ALLERGIES:   Allergies  Allergen Reactions   Iohexol Hives and Shortness Of Breath     Code: HIVES, Desc: PT developed 2 hives, followed by SOB, severe headache post 87cc's Omnipaque 300., Onset Date: [id number]    Amitriptyline Other (See Comments)    Sedated next morning   Ciprofloxacin  Nausea And Vomiting   Cymbalta  [Duloxetine  Hcl] Other (See Comments)    Worsened depression   Lyrica  [Pregabalin ] Other (See Comments)    Tried during hospitalization -  unsure effects but unable to tolerate   Imipramine Hcl Rash   Iodine Rash   Lidocaine  Hcl Rash   Morphine Sulfate Rash   Neosporin [Neomycin-Bacitracin  Zn-Polymyx] Rash and Other (See Comments)    Worsened skin breaking out   Sulfamethoxazole Rash   Tetracyclines & Related Rash    CURRENT MEDICATIONS:  Outpatient Encounter Medications as of 12/08/2024  Medication Sig   acetaminophen  (TYLENOL ) 500 MG tablet Take 2 tablets (1,000 mg total) by mouth in the morning, at noon, and at bedtime.   AMBULATORY NON FORMULARY MEDICATION Lift chair Dx:  G20   carbidopa -levodopa  (SINEMET  CR) 50-200 MG tablet TAKE 1 TABLET BY MOUTH EVERYDAY AT BEDTIME   carbidopa -levodopa  (SINEMET  IR) 25-100 MG tablet TAKE 1 TABLET BY MOUTH 3 (THREE) TIMES DAILY. 9AM/1PM/5PM   Cholecalciferol  (VITAMIN D3) 25 MCG (1000 UT) CAPS Take 1 capsule (1,000 Units total) by mouth daily.   clonazePAM  (KLONOPIN ) 0.5 MG tablet TAKE 1 TABLET BY MOUTH EVERYDAY AT BEDTIME   Cranberry-Vitamin C  (AZO CRANBERRY URINARY TRACT) 250-60 MG CAPS Take 2 capsules by mouth daily.   cyanocobalamin  (VITAMIN B12) 1000 MCG/ML injection Inject 1 mL (1,000 mcg total) into the muscle every 21 ( twenty-one) days.   fludrocortisone  (FLORINEF ) 0.1 MG tablet Take 2 tablets (0.2 mg total) by mouth daily.   Glycerin -Hypromellose-PEG 400 (ARTIFICIAL TEARS) 0.2-0.2-1 % SOLN Place 1 drop into both eyes daily as needed (dry eyes).   Latanoprost  PF 0.005 % SOLN Apply 1 drop to eye daily.   sertraline  (ZOLOFT ) 100 MG tablet Take 1 tablet (100 mg total) by mouth daily.   SYRINGE-NEEDLE, DISP, 3 ML (BD SAFETYGLIDE SYRINGE/NEEDLE) 25G X 1 3 ML MISC Use to inject vitamin B12 monthly.   traMADol  (ULTRAM ) 50 MG tablet Take 1 tablet (50 mg total) by mouth every 6 (six) hours as needed.   trimethoprim  (TRIMPEX ) 100 MG tablet Take 100 mg by mouth daily.   zoledronic  acid (RECLAST ) 5 MG/100ML SOLN injection Inject 100 mLs (5 mg total) into the vein. yearly   No  facility-administered encounter medications on file as of 12/08/2024.    Objective:   PHYSICAL EXAMINATION:    VITALS:   There were no vitals filed for this visit.    GEN:  The patient appears stated age and is in NAD. HEENT:  Normocephalic, atraumatic.  The mucous membranes are moist. The superficial temporal arteries are without ropiness or tenderness. CV:  RRR Lungs:  CTAB Neck/HEME:  There are no carotid bruits bilaterally.  Neurological examination:  Orientation: The patient is alert and oriented x3. Cranial nerves: There is chronic L facial droop.  Smile is symmetric. The speech is fluent and hypophonic and occasionally  dysarthric.  This is her baseline.  Soft palate rises symmetrically and there is no tongue deviation. Hearing is intact to conversational tone. Sensation: Sensation is intact to light touch throughout Motor: Strength is at least antigravity x4.  Movement examination: Tone: There is normal tone in the UE, RLE but there is mild increased tone in the LLE Abnormal movements: no tremor Coordination:  There is no significant decremation today. Gait and Station: Not tested today     Chemistry      Component Value Date/Time   NA 140 04/16/2024 1241   K 3.7 04/16/2024 1241   CL 102 04/16/2024 1241   CO2 31 04/16/2024 1241   BUN 13 04/16/2024 1241   CREATININE 0.70 04/16/2024 1241   CREATININE 0.87 10/08/2019 0921      Component Value Date/Time   CALCIUM  9.4 04/16/2024 1241   ALKPHOS 109 01/10/2024 0911   AST 10 01/10/2024 0911   ALT 1 01/10/2024 0911   BILITOT 0.5 01/10/2024 0911       Lab Results  Component Value Date   WBC 5.4 04/16/2024   HGB 12.1 04/16/2024   HCT 37.1 04/16/2024   MCV 85.6 04/16/2024   PLT 290.0 04/16/2024    Lab Results  Component Value Date   TSH 1.12 01/10/2024    Total time spent on today's visit was *** minutes, including both face-to-face time and nonface-to-face time.  Time included that spent on review of  records (prior notes available to me/labs/imaging if pertinent), discussing treatment and goals, answering patient's questions and coordinating care.  This did not include DBS time.     Cc:  Rilla Baller, MD "

## 2024-12-07 ENCOUNTER — Encounter: Payer: Self-pay | Admitting: Family Medicine

## 2024-12-08 ENCOUNTER — Ambulatory Visit: Admitting: Neurology

## 2024-12-08 ENCOUNTER — Other Ambulatory Visit

## 2024-12-08 ENCOUNTER — Encounter: Payer: Self-pay | Admitting: Neurology

## 2024-12-08 ENCOUNTER — Ambulatory Visit: Payer: Self-pay | Admitting: Neurology

## 2024-12-08 VITALS — BP 124/76 | HR 73 | Wt 133.0 lb

## 2024-12-08 DIAGNOSIS — R519 Headache, unspecified: Secondary | ICD-10-CM | POA: Diagnosis not present

## 2024-12-08 DIAGNOSIS — G20B2 Parkinson's disease with dyskinesia, with fluctuations: Secondary | ICD-10-CM

## 2024-12-08 DIAGNOSIS — R6889 Other general symptoms and signs: Secondary | ICD-10-CM

## 2024-12-08 DIAGNOSIS — Z9689 Presence of other specified functional implants: Secondary | ICD-10-CM

## 2024-12-08 DIAGNOSIS — G253 Myoclonus: Secondary | ICD-10-CM | POA: Diagnosis not present

## 2024-12-08 DIAGNOSIS — Z5181 Encounter for therapeutic drug level monitoring: Secondary | ICD-10-CM

## 2024-12-08 LAB — COMPREHENSIVE METABOLIC PANEL WITH GFR
AG Ratio: 2 (calc) (ref 1.0–2.5)
ALT: 3 U/L — ABNORMAL LOW (ref 6–29)
AST: 14 U/L (ref 10–35)
Albumin: 4.7 g/dL (ref 3.6–5.1)
Alkaline phosphatase (APISO): 98 U/L (ref 37–153)
BUN: 14 mg/dL (ref 7–25)
CO2: 29 mmol/L (ref 20–32)
Calcium: 9.6 mg/dL (ref 8.6–10.4)
Chloride: 103 mmol/L (ref 98–110)
Creat: 0.64 mg/dL (ref 0.60–1.00)
Globulin: 2.4 g/dL (ref 1.9–3.7)
Glucose, Bld: 92 mg/dL (ref 65–99)
Potassium: 3.9 mmol/L (ref 3.5–5.3)
Sodium: 141 mmol/L (ref 135–146)
Total Bilirubin: 0.6 mg/dL (ref 0.2–1.2)
Total Protein: 7.1 g/dL (ref 6.1–8.1)
eGFR: 92 mL/min/1.73m2

## 2024-12-08 LAB — AMMONIA: Ammonia: 50 umol/L

## 2024-12-08 LAB — TSH: TSH: 0.84 m[IU]/L (ref 0.40–4.50)

## 2024-12-08 NOTE — Procedures (Signed)
 DBS Programming was performed.    Manufacturer of DBS device: [employer], bluetooth  Total time spent programming was 10 minutes.  Device was confirmed to be on.  Soft start was confirmed to be on.  Impedences were checked and were within normal limits.  Battery was checked and was determined to be functioning normally and not near the end of life.  Final settings were as follows:      Active Contact Amplitude (mA) PW (ms) Frequency (hz) Side Effects  Left Brain       08/08/21 3-C+ 1.7 60 130   Other trials        1-C+ 2.7 60 130 tremor   (2/3/4)-C+ 3.0 60 130 Face pull above 2.1   (5/6/7)-C+ 1.8 60 130 ? Face pull (some baseline facial asymmetry)  08/22/21 5-(25%)7-(75%)C+ 2.5 60 159   09/05/21 final 5-(25%)6-(75%)C+ 2.9 60 159   Other trials 5-(50%)6-(50%)C+ 2.9 60 159    5-(25%)7-(75%)C+ 3.5 60 159 Tremor not controlled  10/31/21 5-(25%)7-(75%)C+ 3.3 60 159   05/01/22 5-(25%)7-(75%)C+ 3.6 60 159 Transient lip tingle  08/11/22 5-(25%)7-(75%)C+ 3.7 60 159   09/25/22 6-(25%)7-(75%)C+ 3.7 60 159   03/16/23 3c(25%)3b(75%)C+ 3.7 60 159   08/16/23 3c(25%)3b(75%)C+ 3.6 60 159   02/13/24 3c(25%)3b(75%)C+ 3.6 60 159   04/24/24 3c(25%)3b(75%)C+ 3.7 60 159                 Right Brain       08/08/21 (5/6/7)-C+ 1.6 60 130   Other trials 1-C+ 1.5 60 130 ? Speech change   (2/3/4)-C+ 1.5 60 130 Pretty good   8-C+ 1.6 60 130 tremor  08/22/21 8-C+ 2.7 60 136   09/05/21 final 6-(50%)8-(50%)C+ 2.9 60 159   others 6-(60%)8-(40%)C+ 2.8  60 136 Mouth dyskinesia  10/31/21 6-(50%)8-(50%)C+ 3.4 60 159   05/01/22 6-(50%)8-(50%)C+ 3.6 60 159   08/11/22 6-(50%)8-(50%)C+ 3.5 60 159   09/25/22 6-(50%)8-(50%)C+ 3.5 60 159   03/16/23 3b(50%)4(50%)C+ 3.5 60 159   08/16/23 3b(50%)4(50%)C+ 3.5 60 159   02/13/24 3b(50%)4(50%)C+ 3.5 60 159   04/24/24 3b(50%)4(50%)C+ 3.5 60 159

## 2024-12-09 ENCOUNTER — Other Ambulatory Visit: Payer: Self-pay | Admitting: Family Medicine

## 2024-12-10 ENCOUNTER — Other Ambulatory Visit: Payer: Self-pay | Admitting: Family Medicine

## 2024-12-10 DIAGNOSIS — M51369 Other intervertebral disc degeneration, lumbar region without mention of lumbar back pain or lower extremity pain: Secondary | ICD-10-CM

## 2024-12-10 DIAGNOSIS — F321 Major depressive disorder, single episode, moderate: Secondary | ICD-10-CM

## 2024-12-10 NOTE — Telephone Encounter (Signed)
 ERx She will be due for CPE after 01/14/2025 - plz call and schedule this.

## 2024-12-11 ENCOUNTER — Telehealth: Payer: Self-pay

## 2024-12-11 NOTE — Telephone Encounter (Signed)
 Copied from CRM [id number]. Topic: Clinical - Request for Lab/Test Order >> Dec 11, 2024  2:15 PM Macario HERO wrote: Reason for CRM: Patient called to schedule a physical and would like to have labs done a week prior to her physical.

## 2024-12-11 NOTE — Telephone Encounter (Signed)
 I called and I couldn't leave voicemail because it is full.

## 2024-12-11 NOTE — Telephone Encounter (Signed)
 Name of Medication:  Clonazepam , Tramadol  Name of Pharmacy:  [facility] Last Kandra or Written Date and Quantity:        Clonazepam - 11/12/24, #30      Tramadol - 11/11/24, #30 Last Office Visit and Type:  07/15/24, 6 mo f/u Next Office Visit and Type:  01/27/25, annual exam Last Controlled Substance Agreement Date:  04/28/16 Last UDS:  04/28/16

## 2024-12-11 NOTE — Telephone Encounter (Signed)
 Patient is scheduled for CPE on 01/27/25; will need lab appt scheduled.

## 2024-12-13 NOTE — Telephone Encounter (Signed)
 ERx

## 2024-12-16 ENCOUNTER — Ambulatory Visit

## 2025-01-01 ENCOUNTER — Other Ambulatory Visit: Payer: Self-pay | Admitting: Family Medicine

## 2025-01-01 DIAGNOSIS — E538 Deficiency of other specified B group vitamins: Secondary | ICD-10-CM

## 2025-01-01 NOTE — Telephone Encounter (Unsigned)
 Copied from CRM [id number]. Topic: Clinical - Medication Refill >> Jan 01, 2025  1:55 PM Mercedes MATSU wrote: Medication: cyanocobalamin  (VITAMIN B12) 1000 MCG/ML injection SYRINGE-NEEDLE, DISP, 3 ML (BD SAFETYGLIDE SYRINGE/NEEDLE) 25G X 1 3 ML MISC  Patient said she tried to contact Dr, MATSU through mychart for her refill and has not heard a response yet.  Has the patient contacted their pharmacy? Yes (Agent: If no, request that the patient contact the pharmacy for the refill. If patient does not wish to contact the pharmacy document the reason why and proceed with request.) (Agent: If yes, when and what did the pharmacy advise?)  This is the patient's preferred pharmacy:  [facility] [place] [place] - [address] [place] [address] Phone: [phone] Fax: [phone]  Is this the correct pharmacy for this prescription? Yes If no, delete pharmacy and type the correct one.   Has the prescription been filled recently? Yes  Is the patient out of the medication? Yes  Has the patient been seen for an appointment in the last year OR does the patient have an upcoming appointment? Yes  Can we respond through MyChart? Yes  Agent: Please be advised that Rx refills may take up to 3 business days. We ask that you follow-up with your pharmacy.

## 2025-01-20 ENCOUNTER — Other Ambulatory Visit

## 2025-01-27 ENCOUNTER — Encounter: Admitting: Family Medicine

## 2025-06-08 ENCOUNTER — Encounter: Payer: Self-pay | Admitting: Neurology
